# Patient Record
Sex: Male | Born: 1950 | Race: White | Hispanic: No | Marital: Married | Smoking: Former smoker
Health system: Southern US, Community
[De-identification: ages and names within clinical notes are randomized; demographics above are authoritative.]

## PROBLEM LIST (undated history)

## (undated) DIAGNOSIS — K449 Diaphragmatic hernia without obstruction or gangrene: Secondary | ICD-10-CM

## (undated) DIAGNOSIS — M79609 Pain in unspecified limb: Secondary | ICD-10-CM

## (undated) DIAGNOSIS — I2510 Atherosclerotic heart disease of native coronary artery without angina pectoris: Secondary | ICD-10-CM

## (undated) DIAGNOSIS — A048 Other specified bacterial intestinal infections: Secondary | ICD-10-CM

## (undated) DIAGNOSIS — I509 Heart failure, unspecified: Secondary | ICD-10-CM

## (undated) DIAGNOSIS — I200 Unstable angina: Secondary | ICD-10-CM

## (undated) DIAGNOSIS — R931 Abnormal findings on diagnostic imaging of heart and coronary circulation: Secondary | ICD-10-CM

## (undated) DIAGNOSIS — Z95810 Presence of automatic (implantable) cardiac defibrillator: Secondary | ICD-10-CM

## (undated) DIAGNOSIS — I6529 Occlusion and stenosis of unspecified carotid artery: Secondary | ICD-10-CM

## (undated) DIAGNOSIS — M1990 Unspecified osteoarthritis, unspecified site: Secondary | ICD-10-CM

## (undated) DIAGNOSIS — K222 Esophageal obstruction: Secondary | ICD-10-CM

## (undated) DIAGNOSIS — K219 Gastro-esophageal reflux disease without esophagitis: Secondary | ICD-10-CM

## (undated) DIAGNOSIS — Z122 Encounter for screening for malignant neoplasm of respiratory organs: Secondary | ICD-10-CM

## (undated) DIAGNOSIS — I10 Essential (primary) hypertension: Secondary | ICD-10-CM

## (undated) DIAGNOSIS — E785 Hyperlipidemia, unspecified: Secondary | ICD-10-CM

## (undated) DIAGNOSIS — I252 Old myocardial infarction: Secondary | ICD-10-CM

## (undated) DIAGNOSIS — K269 Duodenal ulcer, unspecified as acute or chronic, without hemorrhage or perforation: Secondary | ICD-10-CM

## (undated) DIAGNOSIS — I739 Peripheral vascular disease, unspecified: Secondary | ICD-10-CM

## (undated) DIAGNOSIS — I219 Acute myocardial infarction, unspecified: Secondary | ICD-10-CM

## (undated) HISTORY — DX: Duodenal ulcer, unspecified as acute or chronic, without hemorrhage or perforation: K26.9

## (undated) HISTORY — DX: Hyperlipidemia, unspecified: E78.5

## (undated) HISTORY — DX: Encounter for screening for malignant neoplasm of respiratory organs: Z12.2

## (undated) HISTORY — DX: Acute myocardial infarction, unspecified: I21.9

## (undated) HISTORY — DX: Old myocardial infarction: I25.2

## (undated) HISTORY — DX: Unstable angina: I20.0

## (undated) HISTORY — DX: Abnormal findings on diagnostic imaging of heart and coronary circulation: R93.1

## (undated) HISTORY — DX: Gastro-esophageal reflux disease without esophagitis: K21.9

## (undated) HISTORY — DX: Unspecified osteoarthritis, unspecified site: M19.90

## (undated) HISTORY — DX: Esophageal obstruction: K22.2

## (undated) HISTORY — DX: Other specified bacterial intestinal infections: A04.8

## (undated) HISTORY — DX: Diaphragmatic hernia without obstruction or gangrene: K44.9

## (undated) HISTORY — PX: TONSILLECTOMY: SUR1361

## (undated) HISTORY — DX: Occlusion and stenosis of unspecified carotid artery: I65.29

## (undated) HISTORY — DX: Atherosclerotic heart disease of native coronary artery without angina pectoris: I25.10

## (undated) HISTORY — PX: BACK SURGERY: SHX140

## (undated) HISTORY — DX: Pain in unspecified limb: M79.609

## (undated) HISTORY — PX: APPENDECTOMY: SHX54

## (undated) HISTORY — PX: SPINE SURGERY: SHX786

---

## 1999-04-04 ENCOUNTER — Emergency Department (HOSPITAL_COMMUNITY): Admission: EM | Admit: 1999-04-04 | Discharge: 1999-04-04 | Payer: Self-pay | Admitting: Emergency Medicine

## 1999-04-04 ENCOUNTER — Encounter: Payer: Self-pay | Admitting: Emergency Medicine

## 2002-03-03 ENCOUNTER — Emergency Department (HOSPITAL_COMMUNITY): Admission: EM | Admit: 2002-03-03 | Discharge: 2002-03-03 | Payer: Self-pay | Admitting: Emergency Medicine

## 2002-10-15 ENCOUNTER — Emergency Department (HOSPITAL_COMMUNITY): Admission: EM | Admit: 2002-10-15 | Discharge: 2002-10-15 | Payer: Self-pay | Admitting: Emergency Medicine

## 2002-10-15 ENCOUNTER — Encounter: Payer: Self-pay | Admitting: Emergency Medicine

## 2003-09-29 ENCOUNTER — Ambulatory Visit (HOSPITAL_COMMUNITY): Admission: RE | Admit: 2003-09-29 | Discharge: 2003-09-30 | Payer: Self-pay | Admitting: Neurosurgery

## 2003-10-21 ENCOUNTER — Encounter: Admission: RE | Admit: 2003-10-21 | Discharge: 2003-10-21 | Payer: Self-pay | Admitting: Neurosurgery

## 2004-05-17 ENCOUNTER — Ambulatory Visit (HOSPITAL_COMMUNITY): Admission: RE | Admit: 2004-05-17 | Discharge: 2004-05-17 | Payer: Self-pay

## 2006-01-05 ENCOUNTER — Encounter (INDEPENDENT_AMBULATORY_CARE_PROVIDER_SITE_OTHER): Payer: Self-pay | Admitting: Specialist

## 2006-01-05 ENCOUNTER — Inpatient Hospital Stay (HOSPITAL_COMMUNITY): Admission: RE | Admit: 2006-01-05 | Discharge: 2006-01-06 | Payer: Self-pay | Admitting: Vascular Surgery

## 2006-01-05 HISTORY — PX: CAROTID ENDARTERECTOMY: SUR193

## 2006-07-31 ENCOUNTER — Ambulatory Visit: Payer: Self-pay | Admitting: Vascular Surgery

## 2006-10-17 ENCOUNTER — Ambulatory Visit: Payer: Self-pay | Admitting: Vascular Surgery

## 2007-02-09 ENCOUNTER — Ambulatory Visit: Payer: Self-pay | Admitting: Vascular Surgery

## 2007-09-28 ENCOUNTER — Ambulatory Visit: Payer: Self-pay | Admitting: Vascular Surgery

## 2007-11-29 DIAGNOSIS — I252 Old myocardial infarction: Secondary | ICD-10-CM | POA: Insufficient documentation

## 2007-11-29 DIAGNOSIS — I219 Acute myocardial infarction, unspecified: Secondary | ICD-10-CM

## 2007-11-29 HISTORY — DX: Old myocardial infarction: I25.2

## 2007-11-29 HISTORY — DX: Acute myocardial infarction, unspecified: I21.9

## 2007-12-05 ENCOUNTER — Inpatient Hospital Stay (HOSPITAL_COMMUNITY): Admission: EM | Admit: 2007-12-05 | Discharge: 2007-12-06 | Payer: Self-pay | Admitting: Emergency Medicine

## 2008-05-20 HISTORY — PX: COLONOSCOPY: SHX174

## 2008-10-03 ENCOUNTER — Ambulatory Visit: Payer: Self-pay | Admitting: Vascular Surgery

## 2008-12-15 ENCOUNTER — Encounter: Admission: RE | Admit: 2008-12-15 | Discharge: 2008-12-15 | Payer: Self-pay | Admitting: Orthopedic Surgery

## 2009-11-03 ENCOUNTER — Ambulatory Visit: Payer: Self-pay | Admitting: Vascular Surgery

## 2010-07-13 NOTE — Procedures (Signed)
CAROTID DUPLEX EXAM   INDICATION:  Followup carotid artery disease.   HISTORY:  Diabetes:  No.  Cardiac:  Mild heart attack October 2009.  Hypertension:  No.  Smoking:  Previous.  Previous Surgery:  Right CEA with DPA 01/05/2006.  CV History:  Asymptomatic.  Amaurosis Fugax No, Paresthesias No, Hemiparesis No                                       RIGHT             LEFT  Brachial systolic pressure:         165               158  Brachial Doppler waveforms:         WNL               WNL  Vertebral direction of flow:        Antegrade         Antegrade  DUPLEX VELOCITIES (cm/sec)  CCA peak systolic                   61                79  ECA peak systolic                   59                70  ICA peak systolic                   87                130  ICA end diastolic                   38                45  PLAQUE MORPHOLOGY:                  N/A               Calcified  PLAQUE AMOUNT:                      N/A               Mild/moderate  PLAQUE LOCATION:                    N/A  ICA/ECA/bifurcation   IMPRESSION:  1. Right ICA shows no evidence of restenosis status post CEA.  2. Left ICA shows evidence of 40-59% stenosis.  3. No significant changes from previous study.   ___________________________________________  Larina Earthly, M.D.   AS/MEDQ  D:  10/03/2008  T:  10/03/2008  Job:  [id number]

## 2010-07-13 NOTE — Procedures (Signed)
CAROTID DUPLEX EXAM   INDICATION:  Followup carotid artery disease.   HISTORY:  Diabetes:  No  Cardiac:  No  Hypertension:  No  Smoking:  Quit  Previous Surgery:  Right CEA with DPA 01/05/2006  CV History:  Asymptomatic now, no amaurosis fugax since surgery  Amaurosis Fugax Yes, Paresthesias No, Hemiparesis No                                       RIGHT             LEFT  Brachial systolic pressure:         180               180  Brachial Doppler waveforms:         Triphasic         Triphasic  Vertebral direction of flow:        Antegrade         Antegrade  DUPLEX VELOCITIES (cm/sec)  CCA peak systolic                   67                90  ECA peak systolic                   87                79  ICA peak systolic                   83                118  ICA end diastolic                   35                37  PLAQUE MORPHOLOGY:                  None              Calcified  PLAQUE AMOUNT:                      None              Mild/moderate  PLAQUE LOCATION:                    None              ICA/ECA   IMPRESSION:  1. The right ICA shows no evidence of restenosis status post CEA.  2. The left ICA shows evidence of 40-59% (low end of range).  3. No significant changes from previous study.   ___________________________________________  Larina Earthly, M.D.   AS/MEDQ  D:  02/09/2007  T:  02/10/2007  Job:  [id number]

## 2010-07-13 NOTE — Procedures (Signed)
CAROTID DUPLEX EXAM   INDICATION:  Follow-up carotid artery disease.   HISTORY:  Diabetes:  No.  Cardiac:  No.  Hypertension:  No.  Smoking:  Quit.  Previous Surgery:  Right CEA with DPA 01/05/2006.  CV History:  Asymptomatic now, no amaurosis fugax since surgery.  Amaurosis Fugax No, Paresthesias No, Hemiparesis No                                       RIGHT             LEFT  Brachial systolic pressure:         152               160  Brachial Doppler waveforms:         Triphasic         Triphasic  Vertebral direction of flow:        Antegrade         Antegrade  DUPLEX VELOCITIES (cm/sec)  CCA peak systolic                   79                80  ECA peak systolic                   82                126  ICA peak systolic                   89                131  ICA end diastolic                   35                44  PLAQUE MORPHOLOGY:                  N/A               Calcified  PLAQUE AMOUNT:                      N/A               Mild/moderate  PLAQUE LOCATION:                    N/A               ICA/ECA   IMPRESSION:  1. Right ICA shows no evidence of restenosis status post CEA.  2. Left ICA shows evidence of 40-59% stenosis.  3. No significant changes from previous study.   ___________________________________________  Larina Earthly, M.D.   AS/MEDQ  D:  09/28/2007  T:  09/28/2007  Job:  [id number]

## 2010-07-13 NOTE — Procedures (Signed)
CAROTID DUPLEX EXAM   INDICATION:  Follow up carotid artery disease.   HISTORY:  Diabetes:  No.  Cardiac:  MI.  Hypertension:  No.  Smoking:  Previous.  Previous Surgery:  Right carotid endarterectomy with DPA, 01/05/06.  CV History:  No.  Amaurosis Fugax No, Paresthesias No, Hemiparesis No.                                       RIGHT             LEFT  Brachial systolic pressure:         170               162  Brachial Doppler waveforms:         WNL               WNL  Vertebral direction of flow:        Antegrade         Antegrade  DUPLEX VELOCITIES (cm/sec)  CCA peak systolic                   78                81  ECA peak systolic                   47                72  ICA peak systolic                   90                137  ICA end diastolic                   40                65  PLAQUE MORPHOLOGY:                  N/A  Calcific/heterogenous  PLAQUE AMOUNT:                      N/A               Moderate  PLAQUE LOCATION:                    N/A               Bulb, ICA   IMPRESSION:  1. Right internal carotid artery appears patent, status post carotid      endarterectomy with no evidence of restenosis.  2. Left internal carotid artery suggests 40% to 59% stenosis.  3. Antegrade flow in bilateral vertebrals.   ___________________________________________  Larina Earthly, M.D.   CB/MEDQ  D:  11/03/2009  T:  11/03/2009  Job:  [id number]

## 2010-07-13 NOTE — Assessment & Plan Note (Signed)
OFFICE VISIT   [name], [name]  DOB:  05/29/1950                                       11/03/2009  CHART#:[id number]   [name] presents today for continued follow-up of his extensive  vascular occlusive disease.  He is known to me from prior right carotid  endarterectomy for asymptomatic disease in November 2007.  He has had no  neurologic deficits.  He quit smoking at the time of his surgery and has  not resumed since.  He does have a history of prior myocardial  infarction in 2009.  He had a prior lumbar back surgery.   SOCIAL HISTORY:  He works in [employer].  He is married with one child.  He  does have a couple of alcohol drinks per day.   FAMILY HISTORY:  Significant for coronary artery disease in both the  mother and father.   REVIEW OF SYSTEMS:  Mainly positive for arthritis, musculoskeletal joint  pain.  Otherwise negative.   PHYSICAL EXAMINATION:  Well-developed, well-nourished, white male  appearing his stated age in no acute distress.  Blood pressure 179/113,  heart rate 65, respirations 18.  HEENT is normal.  Chest:  Clear  bilaterally.  He does have palpable radial pulses bilaterally.  He has a  well-healed neck incision on the right with no carotid bruits.  Musculoskeletal:  Shows no major deformities or cyanosis.  Neurologic:  No focal weakness or paresthesias.  Skin:  Without ulcers or rashes..  He underwent a repeat carotid duplex today.  I discussed this with Mr.  and [name].  This reveals no evidence of recurrence in his right  carotid endarterectomy site.  He does have moderate 40 to 59% stenosis  in his left internal carotid artery and this is unchanged.  He will  notify us should he develop any neurologic deficits.  Otherwise, we will  see him on a continued yearly duplex follow-up of his carotids.     [name], M.D.  Electronically Signed   TFE/MEDQ  D:  11/03/2009  T:  11/04/2009  Job:  [id number]   cc:   C. Duane Lope,  M.D.

## 2010-07-13 NOTE — Cardiovascular Report (Signed)
NAMEBRITTIN, [name] NO.:  [id number]   MEDICAL RECORD NO.:  [id number]          PATIENT TYPE:  INP   LOCATION:  4735                         FACILITY:  [facility]   PHYSICIAN:  Jake Bathe, MD      DATE OF BIRTH:  [birth year]   DATE OF PROCEDURE:  DATE OF DISCHARGE:  12/06/2007                            CARDIAC CATHETERIZATION   PROCEDURES:  1. Left heart catheterization  2. Selective coronary angiography.  3. Left ventriculogram.   INDICATIONS:  A 60 year old male with unstable angina, mildly positive  troponin at 0.08 with third set normal troponin with a history of  hyperlipidemia and peripheral vascular disease status post right carotid  endarterectomy, who quit smoking 2 years ago and both his mother and  father has had coronary artery disease in their 36s.   PROCEDURE DETAILS:  The patient's informed consent was obtained.  Risk  and benefits including stroke, heart attack, death were relayed to the  patient.  He was placed on the catheterization table, prepped in a  sterile fashion.  Using the modified Seldinger technique after 1%  lidocaine for local anesthesia, a 6-French sheath was inserted to the  right femoral artery after visualization of the femoral head under  fluoroscopic guidance.  A Judkins left #4 catheter was then selectively  cannulated into the left main artery and multiple views with Omnipaque  hand injection were obtained.  This catheter was then exchanged for a  Judkins right #4 catheter, which was used to selectively cannulate the  right coronary artery and multiple views of hand injection of Omnipaque  were obtained.  This catheter was exchanged for an angled pigtail  catheter which was used to cross the aortic valve in the left ventricle.  In the RAO position, a powered left ventriculogram was obtained  utilizing 28 mL of contrast.  Following the procedure, sheaths were  removed after ACT was drawn, and the patient was hemodynamically  stable  with no evidence of hematoma.   FINDINGS:  1. Left main artery - no angiographically significant coronary artery      disease, branches into LAD and circumflex.  2. Left anterior descending artery:  There are 2 large diagonal      branches.  The vessel narrows at the point of the first diagonal      branch, just before the first major septal branch.  This narrowing      is approximately to 50% of the proximal portion.  The rest of the      vessel continues at this diameter without any significant stenosis.  3. Left circumflex artery:  This is a large caliber vessel with minor      irregularities throughout, especially at a branch segment of the      second and third obtuse marginal branches, which originates in      close proximity to each other in the mid segment.  There is a small      caliber ramus branch.  4. Right coronary artery - in the mid segment, there is a 50-60%  tubular lesion, approximately 10-15 mm in length before the takeoff      of the PDA.  Within the PDA, there are a few minor irregularities      distally.   Left ventriculogram:  No wall motion abnormalities.  Normal left  ventricular ejection fraction of 65%.  No significant mitral  regurgitation.   HEMODYNAMICS:  Left ventricular systolic pressure 113, end-diastolic  pressure 9 mmHg.  Aortic pressure 116/62 with a mean of 87 mmHg.  There  is no gradient across the aortic valve.   IMPRESSIONS:  1. Moderate coronary artery disease, likely nonobstructive noted in      the mid right coronary artery up to 60% as described above.  Minor      irregularities throughout other arterial branches.  2. Normal wall motion of the left ventricle with ejection fraction of      65%.  3. Normal left ventricular end-diastolic pressure.   PLAN:  We will medically manage with aspirin and Plavix.  Plavix x1  month.  We will bring back to the office and reevaluate chest  discomfort.  We will likely proceed with  nuclear stress test for further  detection of any possible ischemia in the RCA segment territory or the  inferior wall given that moderate severity lesion.  Given his  bradycardia, I will not initiate beta blocker.  I will, however, give  him isosorbide mononitrate 30 mg once a day.  I will also give him  prescription for nitroglycerin p.r.n.  Certainly, his chest discomfort  could have been a vasospastic event, hence the nitroglycerin.  In  addition, I have placed him on pravastatin given his multiple statin  intolerances in the past.  I would like him to be on a statin  medication, especially given his prior carotid endarterectomy.  Creatinine today was 1.0, decreased with IV fluid hydration.      Jake Bathe, MD  Electronically Signed     MCS/MEDQ  D:  12/06/2007  T:  12/06/2007  Job:  [id number]   cc:   C. Duane Lope, M.D.

## 2010-07-13 NOTE — Consult Note (Signed)
[name], [name] NO.:  [id number]   MEDICAL RECORD NO.:  [id number]          PATIENT TYPE:  INP   LOCATION:  4735                         FACILITY:  [facility]   PHYSICIAN:  Jake Bathe, MD      DATE OF BIRTH:  08-12-1950   DATE OF CONSULTATION:  12/05/2007  DATE OF DISCHARGE:                                 CONSULTATION   CHIEF COMPLAINT:  Chest pain.   [name] is a 60 year old male patient with no known history of coronary  artery disease.  He was up at 5 a.m. today, drinking coffee, and  suddenly he had severe substernal chest pain.  He was in the basement.  He made it back up stairs to his wife and then she called EMS.  His only  other complaint was diaphoresis.  He had no nausea, vomiting, dyspnea,  or syncope.   EMS arrived and gave the patient several sublingual nitroglycerin, which  indeed helped, but he said he finally got pain relief with the  nitroglycerin paste in the ER.  Since that time, he has been pain-free.   PAST MEDICAL HISTORY:  He has undergone a right carotid endarterectomy  in the past.  He also has a history of hyperlipidemia.   SOCIAL HISTORY:  He lives in [place] with his wife, plays golf.  Denies tobacco or illicit drug use.  He does drink alcohol daily.   FAMILY HISTORY:  Both parents had heart disease in their 45s.   ALLERGIES:  No known drug allergies.   MEDICATIONS:  1. Baby aspirin 81 mg a day.  2. Protonix 40 mg a day.  3. Lovaza 1 gram daily.  4. Nitroglycerin 1 inch q.6 h.   ROS: no syncope, no bleeding, no orthopnea. Unless above all other 12  ROS negative.   PHYSICAL EXAMINATION:  VITAL SIGNS:  Temperature 98.1, pulse 51,  respirations 16, blood pressure 134/67, and O2 saturations 96% on room  air.  GENERAL:  He is in no acute distress and wants to go home.  HEENT:  Grossly normal.  Sclerae clear.  Conjunctivae normal.  Nares  without drainage.  NECK:  No carotid or subclavian bruits.  No JVD or  thyromegaly.  CHEST:  Clear to auscultation bilaterally.  No wheezing or rhonchi.  HEART:  Regular rate and rhythm.  No rubs or murmur.  ABDOMEN:  Good bowel sounds.  Nontender and nondistended.  No masses.  No bruits.  EXTREMITIES:  Lower extremity, no peripheral edema.  SKIN:  Warm and dry.  NEUROLOGIC:  Cranial nerves II through XII grossly intact.  Normal mood  and affect.   Chest x-ray personally viewed showed left perihilar and right basilar  atelectasis.   LABORATORY DATA:  D-dimer 0.28.  Hemoglobin 14.8, hematocrit 44.1,  platelets 200, and white count 5.9.  Sodium 138, potassium 4.3, BUN 28,  creatinine 1.5.  Point-of-care markers negative x2.  CK-MB 124/5.8 with  a troponin of 0.08.   EKG shows sinus bradycardia, rate 47 with nonspecific ST-T wave changes  anterolaterally.   ASSESSMENT AND PLAN:  1. Typical chest pain concerning for unstable angina.  2. Dyslipidemia.  3. Family history of coronary artery disease.  4. Peripheral vascular disease status post right carotid      endarterectomy.   The story is concerning for coronary artery disease.  We will start IV  heparin.  Increase aspirin to full dose.  We will not add a beta-blocker  secondary to his bradycardia.  A cardiac catheterization has been  scheduled for tomorrow at 9:00 a.m. under the care of Dr. Donato Schultz.      [name], P.A.      Jake Bathe, MD  Electronically Signed    LB/MEDQ  D:  12/05/2007  T:  12/06/2007  Job:  [id number]   cc:   Jake Bathe, MD  C. Duane Lope, M.D.

## 2010-07-13 NOTE — Assessment & Plan Note (Signed)
OFFICE VISIT   [name], [name]  DOB:  02/26/1951                                       07/31/2006  CHART#:[id number]   The patient presents today for follow-up of his right carotid  arterectomy and Dacron patch angioplasty at [facility] on January 05, 2006.  The patient has had no difficulty since his endarterectomy 6  months ago.  He has returned to his usual activity and has had no  neurologic deficits.  He has had no further visual changes since surgery  and no transient ischemic attack or stroke.  He fortunately had quit  smoking at the time of surgery.  His medical history is otherwise  unchanged with no cardiac difficulties and he does not have any  hypertension or diabetes.   PHYSICAL EXAMINATION:  Well developed, well nourished white male who  appears his stated age of 81.  Blood pressure is 139/95.  Pulse is 68, respirations 18.  His right carotid incision is well healed with no evidence of bruit.  He  has no bruits in the left carotid system.  He has 2+ radial and 2+  dorsalis pedis pulse bilaterally.   He underwent repeat duplex evaluation showing widely patent right  endarterectomy with no evidence of recurrent stenosis.  Left carotid  system shows 40% to 59% stenosis which is unchanged from his  preoperative value.  I have recommended that we continue to follow him  with serial ultrasounds to rule out any progression of his asymptomatic  disease on the right.  He knows to notify us immediately should he have  any difficulty.  He also gave me a platter made  by his brother-in-law who was a [address] and I told him  that I was very much appreciative of this.   Larina Earthly, M.D.  Electronically Signed   TFE/MEDQ  D:  07/31/2006  T:  08/01/2006  Job:  [id number]   cc:   C. Duane Lope, M.D.

## 2010-07-13 NOTE — H&P (Signed)
NAME:  [name], [name]                  ACCOUNT NO.:  [id number]   MEDICAL RECORD NO.:  [id number]          PATIENT TYPE:  INP   LOCATION:  4735                         FACILITY:  [facility]   PHYSICIAN:  Kela Millin, M.D.DATE OF BIRTH:  May 05, 1950   DATE OF ADMISSION:  12/05/2007  DATE OF DISCHARGE:                              HISTORY & PHYSICAL   PRIMARY CARE PHYSICIAN:  C. Duane Lope, M.D.   CHIEF COMPLAINT:  Chest pain.   HISTORY OF PRESENT ILLNESS:  The patient is a 60 year old white male  former smoker with past medical history significant for hyperlipidemia  and status post right carotid endarterectomy in November 2007, who  presents with the above complaints.  He states that he was in his usual  state of health until about 5:15 this morning while he was up ironing  and began experiencing left precordial chest pain.  He describes the  pain as a pressure 10/10 in intensity and he was diaphoretic with some  shortness of breath.  He denies nausea and vomiting.  No radiation.  Also no paresthesias reported.  He was given nitroglycerin and that  helped to relieve the pain.   In the ER, an EKG was done showing sinus bradycardia at a rate of 52  with nonspecific T-wave abnormalities.  His initial point of care  markers were negative and a chest x-ray with atelectasis.  He is  admitted for further evaluation and management.   PAST MEDICAL HISTORY:  1. As above.  2. History of bilateral foot pain - recently started on a steroid      taper.   MEDICATIONS:  1. Fenofibrate.  2. Fish oil.  3. Prednisone taper.  4. Glucosamine.  5. Aspirin.  6. Vitamin B12.   ALLERGIES:  NKDA.   SOCIAL HISTORY:  He quit tobacco 2 years ago after smoking for 30 years.  Occasional alcohol.   FAMILY HISTORY:  His mother and father had MIs in their mid 49s.   REVIEW OF SYSTEMS:  As per HPI, other review of systems negative.   PHYSICAL EXAMINATION:  GENERAL:  The patient is a middle-aged white  male.  He is alert and appropriate in no apparent distress.  VITAL SIGNS:  His temperature is 96.8 with a blood pressure 122/74,  pulse of 52, respiratory rate 20, O2 sat of 98%.  HEENT:  PERRL, EOMI, slightly dry mucous membranes.  No oral exudates.  Sclerae anicteric.  LUNGS:  Decreased breath sounds at the bases, otherwise clear to  auscultation.  CARDIOVASCULAR:  Mildly bradycardic, regular, no S3.  ABDOMEN:  Soft, bowel sounds present, nontender, nondistended.  No  organomegaly and no mass is palpable.  EXTREMITIES:  No cyanosis and no edema.  NEURO:  Alert and oriented x3.  Cranial nerves II-XII are grossly  intact.  Nonfocal exam.   LABORATORY DATA:  As per HPI, also white cell count is 5.9 with a  hemoglobin of 14.8, hematocrit of 44.1, platelet count 200, sodium is  138, potassium of 4.3, chloride 101, BUN is 28, creatinine 1.5, glucose  is 98, calcium is  1.12.   ASSESSMENT/PLAN:  1. Chest pain - as discussed above, we will obtain serial cardiac      enzymes, place on nitroglycerin, aspirin, consult cardiology      pending cardiac enzymes.  Also as noted above, he has been on a      prednisone taper.  We will cover with PPI for possible GI etiology.  2. Renal insufficiency - likely acute, hydrate, follow and recheck.  3. History of hyperlipidemia - follow.  Continue outpatient      medications.  4. History of right carotid artery disease - status post carotid      endarterectomy as above.      Kela Millin, M.D.  Electronically Signed     ACV/MEDQ  D:  12/06/2007  T:  12/06/2007  Job:  [id number]   cc:   C. Duane Lope, M.D.

## 2010-07-13 NOTE — Discharge Summary (Signed)
[name], [name] NO.:  [id number]   MEDICAL RECORD NO.:  [id number]          PATIENT TYPE:  INP   LOCATION:  4735                         FACILITY:  [facility]   PHYSICIAN:  Jake Bathe, MD      DATE OF BIRTH:  Jul 01, 1950   DATE OF ADMISSION:  12/05/2007  DATE OF DISCHARGE:  12/06/2007                               DISCHARGE SUMMARY   DISCHARGE DIAGNOSES:  1. Chest pain, resolved.  2. Coronary artery disease, nonobstructive.  3. Hyperlipidemia with intolerance to numerous statins  4. Status post right carotid endarterectomy.  5. Former smoker.  6. Family history of coronary artery disease.   HOSPITAL COURSE:  [name] is a 60 year old male patient who awoke at 5  a.m. on the day of admission, he was drinking coffee, and developed  severe substernal chest pain, he quickly made it upstairs where he awoke  his wife, and she called EMS.  His only other symptoms were he was  diaphoretic.   EMS was called, and the patient's symptoms abated with sublingual  nitroglycerin.  While in the hospital, his troponin was elevated at  0.08, but his creatinine was somewhat elevated at 1.5, and there is a  question of whether or not this made a small bump in the troponin.  After hydration, his creatinine improved to 1.08.  His cholesterol  levels showed total cholesterol of 196, triglycerides 71, HDL 54, and  LDL 128.  For this reason, he was placed on pravastatin.  Hemoglobin  14.5, hematocrit 42.7, platelets 213, and white count 6.4.  TSH 1.483.  D-dimer was 0.28.  Chest x-ray; probable left perihilar and right  basilar atelectasis.  EKG; sinus bradycardia, rate 47 with no acute ST-T  wave changes.  For the reason of bradycardia, we were unable to use a  beta-blocker.   He then underwent cardiac catheterization on December 06, 2007, and he was  found to have a 60% mid right coronary artery lesion that was felt to be  nonobstructive, however, we will go ahead and perform an  office  Cardiolite to assure that there is no underlying ischemia in this  vessel.   In the meantime, he will go home on the following medications:  1. Plavix 75 mg 1 p.o. daily for 30 days.  2. Aspirin 325 mg a day.  3. Imdur 30 mg a day.  4. Pravastatin 40 mg 1 p.o. nightly.  5. Sublingual nitroglycerin p.r.n. chest pain.   Our office will call him with stress Cardiolite date and time.  He is  not to perform any strenuous activity until this day.  He is to clean  his cath site gently with soap and water.  No scrubbing.  Remain on a  low-sodium, heart-healthy diet.  Increase activity slowly.  No lifting  over 10 pounds for 1 week.  No driving for 2 days.  He is to call us  with any further questions or concerns.      Guy Franco, P.A.      Jake Bathe, MD  Electronically Signed  LB/MEDQ  D:  12/06/2007  T:  12/07/2007  Job:  [id number]   cc:   Jake Bathe, MD

## 2010-07-16 NOTE — Op Note (Signed)
[name], [name]                            ACCOUNT NO.:  [id number]   MEDICAL RECORD NO.:  [id number]                   PATIENT TYPE:  OIB   LOCATION:  2899                                 FACILITY:  [facility]   PHYSICIAN:  Kathaleen Maser. Green, M.D.                 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   PROCEDURE:  Left L4-5 laminotomy and microdiskectomy and left L4-5  extraforaminal microdiskectomy.   SURGEON:  Kathaleen Maser. Green, M.D.   ASSISTANT:  Donalee Citrin, M.D.   ANESTHESIA:  General endotracheal.   INDICATIONS FOR PROCEDURE:  The patient is a 60 year old male with a history  of back and left lower extremity pain consistent with a left-sided L4 and L5  radiculopathy which has failed conservative management.  Workup demonstrates  evidence of two significant problems at the L4-5 level.  He has a left  paracentral disk herniation with an inferior fragment causing compression of  the thecal sac and left-sided L5 nerve root as well as a left L4-5 foraminal  and extraforaminal disk herniation causing compression of the exiting L4  nerve root.  We discussed options of management including the possibility of  undergoing a left-sided L4-5 laminotomy and microdiskectomy with possible  extraforaminal microdiskectomy as well.  The patient is aware of the risks  and benefits and wishes to proceed.   DESCRIPTION OF PROCEDURE:  The patient was taken to the operating room and  placed on the table in the supine position.  After an adequate level of  anesthesia was achieved, the patient was placed prone onto a Wilson frame  and properly padded.  The patient's lumbar region was prepped and draped  sterilely.  A 10 blade was used to make a  linear skin incision overlying the  L4-5 interspace.  This was carried down sharply in the midline.  Subperiosteal dissection was performed exposing the lamina and facet joints  at L4 and L5.  Deep self-retaining retractor was placed.  Intraoperative x-  ray was taken and the level was confirmed.  Laminotomy then performed using  the high speed drill and Kerrison rongeurs.  The remainder of the inferior  aspect of the lamina of L4, the medial aspect of the L4-5 facet joint, and  the superior rim of the L5 lamina.  Ligamentum flavum was then elevated and  resected in a piecemeal fashion using Kerrison rongeurs.  Underlying thecal  sac and exiting L5 nerve root were identified.  Microscope was brought into  the field and used for microdissection of the left-sided L5 nerve root,  underlying disk herniation, and epidural venous plexus coagulated and cut.  Thecal sac and  nerve root were mobilized and retracted toward the midline.  Disk space and disk herniation were readily apparent.  This was then incised  with a 15 blade in a rectangular fashion.  Wide disk space cleanout was then  achieved using pituitary rongeurs, up-angled pituitary rongeurs, and Epstein  curets.  A moderately large subligamentous fragment of disk herniation was  encountered and completely resected.  Dissection then proceeded cephalad  into the axilla of the left-sided L4 nerve root.  There was no obvious free  fragment of disk present.  Attempts to sweep any disk material into the  foramen or into the disk space were unsuccessful.  Because it was felt there  was still significant pressure on the exiting L4 nerve root, however, the  compression was more on the outside aspect of the foramen, it was decided to  make an extraforaminal approach to decompress this nerve root.  The  retractor was repositioned.  Dissection was then made along the inferior  aspect of the transverse process at L4 and the lateral aspect of the L4-5   facet joint complex.  A small amount of the superior facet of L5 was removed  using the high speed drill.  Intertransverse ligament was then elevated and  resected in a piecemeal fashion using Kerrison rongeurs.  Underlying right-  sided L4 nerve root was identified.  A wide intra and extraforaminal  decompression was then performed along the course of the exiting L4 nerve  root.  A moderate amount of disk herniation was encountered and completely  resected beneath the left-sided L4 nerve root.  At this point, a very  thorough decompression had been achieved.  There was no evidence of injury  to the thecal sac and nerve roots.  There was no evidence of any residual  compression of the thecal sac and nerve roots.  The wound was then irrigated  with antibiotic solution.  Gelfoam was placed topically for hemostasis and  found to be good.  Microscope and retractors were removed.  Hemostasis was  achieved with electrocautery.  The wound was closed in layers with Vicryl  sutures.  Steri-Strips and sterile dressings were applied.  There were no  apparent complications.  The patient tolerated the procedure well and he  returns to the recovery room postoperatively.                                               [name], M.D.    HAP/MEDQ  D:  09/29/2003  T:  09/29/2003  Job:  [id number]

## 2010-07-16 NOTE — Discharge Summary (Signed)
NAMECHACE, KLIPPEL NO.:  [id number]   MEDICAL RECORD NO.:  [id number]          PATIENT TYPE:  INP   LOCATION:  3313                         FACILITY:  [facility]   PHYSICIAN:  Larina Earthly, M.D.    DATE OF BIRTH:  18-May-1950   DATE OF ADMISSION:  01/05/2006  DATE OF DISCHARGE:  01/06/2006                               DISCHARGE SUMMARY   PRIMARY ADMITTING DIAGNOSIS:  Symptomatic right internal carotid artery  stenosis.   ADDITIONAL/DISCHARGE DIAGNOSES:  1. Severe symptomatic right internal carotid artery stenosis.  2. Hyperlipidemia.  3. Ongoing tobacco abuse.   PROCEDURES PERFORMED:  Right carotid endarterectomy with Dacron patch  angioplasty.   HISTORY:  The patient is a 60 year old male who approximately 6 weeks  prior to this admission had an episode of right eye amaurosis fugax.  In  the ensuing weeks, he had an additional episode and was referred to Dr.  Elise Benne for an ophthalmologic exam.  This showed no evidence of  abnormalities in the eye.  He also had a sed rate and a C-reactive  protein drawn all of which were normal.  He subsequently underwent a  carotid duplex scan which revealed a severe right carotid stenosis and a  moderate left carotid stenosis.  He then was referred to Dr. Tawanna Cooler Early  for consideration of surgical intervention.  Dr. Arbie Cookey reviewed his carotid duplex which showed a severe 80-99% right  internal carotid artery stenosis with a 40-50% left carotid stenosis.  It was Dr. Bosie Helper opinion that he should undergo a right carotid  endarterectomy at this time in order to decrease his risk of stroke.  He  explained the risks, benefits and alternatives of the procedure to the  patient and he agreed to proceed with surgery.   HOSPITAL COURSE:  He was admitted to [facility] on January 05, 2006, and underwent a right carotid endarterectomy as described in  detail above, performed by Dr. Arbie Cookey.  He tolerated the procedure  well  and was transferred to the step-down unit in stable condition.  Postoperatively, he progressed well.  He initially required a low-dose  dopamine drip for some hypotension but by post-op day #1 had been weaned  from the drips completely.  He was able to ambulate in the halls without  problem.  On post-op day #1, his incision was healing well.  He was  neurologically intact.  His labs showed a hemoglobin of 12.7, hematocrit 37, platelets 224,  white count 7.6.  Sodium 140, potassium 4, BUN 9, creatinine 0.7.  He continued to progress over the morning of post-op day #1 and about  mid day was deemed ready for discharge home.   At the time of discharge he was tolerating a regular diet.   DISCHARGE MEDICATIONS:  As follows:  1. Aspirin 81 mg daily.  2. Glucosamine 1500 mg daily.  3. B12 1000 mg daily.  4. Fish oil one daily.  5. Tylox one to two q.4 h p.r.n. for pain.   DISCHARGE INSTRUCTIONS:  1. He is asked to refrain from driving,  heavy lifting or strenuous      activity.  2. He may continue ambulating daily and using his incentive      spirometer.  3. He may shower daily and clean his incisions with soap and water.  4. He will continue low-fat, low-sodium diet.   DISCHARGE FOLLOWUP:  He will be contacted by the [facility] office with an  appointment see Dr. Arbie Cookey in 3 weeks.  In the interim, if he experiences  any problems or has questions he is asked to contact our office  immediately.      Coral Ceo, P.A.      Larina Earthly, M.D.  Electronically Signed    GC/MEDQ  D:  03/08/2006  T:  03/08/2006  Job:  [id number]   cc:   C. Duane Lope, M.D.

## 2010-07-16 NOTE — Op Note (Signed)
NAMEPHILBERT, [name] NO.:  [id number]   MEDICAL RECORD NO.:  [id number]          PATIENT TYPE:  INP   LOCATION:  2899                         FACILITY:  [facility]   PHYSICIAN:  Larina Earthly, M.D.    DATE OF BIRTH:  [birth year]   DATE OF PROCEDURE:  01/05/2006  DATE OF DISCHARGE:                                 OPERATIVE REPORT   PREOPERATIVE DIAGNOSIS:  Severe symptomatic right internal carotid artery  stenosis.   POSTOPERATIVE DIAGNOSIS:  Severe symptomatic right internal carotid artery  stenosis.   PROCEDURE:  Right carotid endarterectomy with Dacron patch angioplasty.   SURGEON:  Larina Earthly, M.D.   ASSISTANT:  Janetta Hora. Darrick Penna, M.D.  Coral Ceo, P.A.-C.   ANESTHESIA:  General endotracheal anesthesia.   COMPLICATIONS:  None.   DISPOSITION:  To the recovery room neurologically intact.   PROCEDURE IN DETAIL:  The patient was taken to the operating room and placed  in the supine position where the area of the right neck was prepped and  draped in the usual sterile fashion.  An incision was made anterior to the  sternocleidomastoid and carried down through the platysma with  electrocautery.  The sternocleidomastoid was reflected posteriorly and the  carotid sheath was opened.  The facial vein was ligated with 3-0 silk ties  and was divided.  The common carotid artery was encircled with an umbilical  tape and Rumel tourniquet.  The vagus and hypoglossal nerves were identified  and preserved.  The dissection was carried onto the bifurcation and the  superior thyroid artery was circled with 2-0 silk Potts tie, the external  carotid was encircled with a blue vessel loop, and the internal carotid  encircled with umbilical tape and Rumel tourniquet.  The patient was given  8000 units of intravenous heparin.  After adequate circulation time, the  internal, external, and common carotid arteries were occluded.  The common  carotid artery was opened with an 11  blade and extended with Potts scissors  through the plaque onto the internal carotid artery.  A 10 shunt was passed  up the internal carotid artery and allowed to back bleed and then down the  common carotid and secured with Rumel tourniquets.  The endarterectomy was  begun on the common carotid artery and the plaque was divided proximally  with Potts scissors.  The endarterectomy was continued onto the bifurcation.  The external carotid was endarterectomized with eversion technique and the  internal carotid was endarterectomized in an open fashion.  The remaining  atheromatous debris was removed from the endarterectomy plane.  A Finesse  Hemashield Dacron patch was brought on the field and sewn as a patch  angioplasty with a running 6-0 Prolene suture.  Prior to completion of the  anastomosis, the shunt was removed and the usual flush maneuvers undertaken.  The anastomosis was completed.  The external followed by the common and then  the internal carotid occlusion clamps were removed.  Excellent flow  characteristics were noted with handheld Doppler in the internal and  external carotid arteries.  The patient  was given 50 mg protamine to reverse  the heparin.  The wounds were irrigated with saline and hemostasis with  electrocautery.  The wounds were closed with several 3-0 Vicryl sutures  reapproximating the sternocleidomastoid over the carotid sheath.  The  platysma was closed running  3-0 Vicryl suture and, finally, the skin was closed 4-0 subcuticular Vicryl  stitch.  A dressing was applied.  The patient was awakened and was  neurologically intact in the operating room and was returned to the recovery  room in stable condition.      Larina Earthly, M.D.  Electronically Signed     TFE/MEDQ  D:  01/05/2006  T:  01/05/2006  Job:  [id number]   cc:   C. Duane Lope, M.D.  Vincenza Hews, M.D.

## 2010-07-16 NOTE — H&P (Signed)
[name], [name] NO.:  [id number]   MEDICAL RECORD NO.:  [id number]           PATIENT TYPE:   LOCATION:                                 FACILITY:   PHYSICIAN:  Larina Earthly, M.D.         DATE OF BIRTH:   DATE OF ADMISSION:  01/05/2006  DATE OF DISCHARGE:                                HISTORY & PHYSICAL   ADMISSION DIAGNOSIS:  Severe symptomatic right internal carotid artery  stenosis.   HISTORY OF PRESENT ILLNESS:  The patient is a 60 year old gentleman, who  approximately 6 weeks ago had a clear-cut episode of right eye amaurosis  fugax.  He had a similar episode approximately 1 month later and underwent a  workup to include ophthalmologic exam by Dr. Elise Benne showing no  evidence of abnormalities in his eyes.  He also had evaluation of sed rate  and C-reactive protein, all normal.  He underwent a carotid duplex revealing  severe right carotid stenosis and moderate left carotid stenosis.  He  specifically denies any other events of transient ischemic attack or stroke.   PAST MEDICAL HISTORY:  Significant for elevated lipids, but no cardiac  disease and no hypertension or diabetes.   FAMILY HISTORY:  Significant for premature atherosclerotic disease in his  mother and his father with his mother undergoing carotid endarterectomy and  coronary bypass grafting in the past.   SOCIAL HISTORY:  He is married with 1 child.  He works in Airline [employer].  He does  smoke 1 pack of cigarettes per day.  He does have 1-2 drinks of alcohol per  day.   REVIEW OF SYSTEMS:  Positive only for pain in his legs and low back with  prior degenerative disk disease and sciatic pain.  He does have arthritic  joint pain as well.   ALLERGIES:  NO KNOWN DRUG ALLERGIES.   CURRENT MEDICATIONS:  1. Aspirin 81 mg per day.  2. Tylenol Arthritis per day.  3. Glucosamine 1500 mg per day.  4. B12 1000 mg per day.  5. Fish oil daily.   PHYSICAL EXAMINATION:  GENERAL:   Well-developed, well-nourished white male  appearing his stated age of 64.  VITAL SIGNS:  Blood pressure 130/84, pulse 72, respirations 16.  NEUROLOGIC:  He is grossly intact neurologically.  HEENT:  Within normal limits.  He has no carotid bruits bilaterally.  LUNGS:  Chest is clear bilaterally.  CARDIAC:  He has a regular rate and rhythm without murmurs.  His radial,  femoral, and posterior tibial pulses are 2+ bilaterally.   LABORATORY DATA:  A carotid duplex reveals severe 80%-99% right carotid  stenosis and moderate 40%-50% left carotid stenosis.   IMPRESSION:  Severe symptomatic right carotid stenosis with 2 episodes of  amaurosis fugax.   PLAN:  The patient will be admitted for an elective carotid endarterectomy.  The procedure including 1%-2% risk of stroke and low risk of cranial nerve  injury were discussed with the patient and wife, who understand and wish to  proceed with surgery on January 05, 2006.  Larina Earthly, M.D.  Electronically Signed     TFE/MEDQ  D:  01/02/2006  T:  01/03/2006  Job:  [id number]   cc:   C. Duane Lope, M.D.

## 2010-11-02 ENCOUNTER — Other Ambulatory Visit (INDEPENDENT_AMBULATORY_CARE_PROVIDER_SITE_OTHER): Payer: 59

## 2010-11-02 DIAGNOSIS — Z48812 Encounter for surgical aftercare following surgery on the circulatory system: Secondary | ICD-10-CM

## 2010-11-02 DIAGNOSIS — I6529 Occlusion and stenosis of unspecified carotid artery: Secondary | ICD-10-CM

## 2010-11-11 ENCOUNTER — Encounter: Payer: Self-pay | Admitting: Vascular Surgery

## 2010-11-11 NOTE — Procedures (Unsigned)
CAROTID DUPLEX EXAM  INDICATION:  Followup carotid artery disease.  HISTORY: Diabetes:  No Cardiac:  MI Hypertension:  No Smoking:  Previous Previous Surgery:  Right carotid endarterectomy with Dacron patch angioplasty, 01/05/2006 CV History:  Currently asymptomatic Amaurosis Fugax No, Paresthesias No, Hemiparesis No                                      RIGHT             LEFT Brachial systolic pressure:         166               168 Brachial Doppler waveforms:         Normal            Normal Vertebral direction of flow:        Antegrade         Antegrade DUPLEX VELOCITIES (cm/sec) CCA peak systolic                   68                88 ECA peak systolic                   68                87 ICA peak systolic                   75                124 ICA end diastolic                   38                53 PLAQUE MORPHOLOGY:                                    Calcific PLAQUE AMOUNT:                      None              Moderate PLAQUE LOCATION:                                      Bifurcation, ICA  IMPRESSION: 1. Patent right carotid endarterectomy site with no evidence of     restenosis of the ICA. 2. Left internal carotid artery velocity suggests 40% to 59% stenosis. 3. Antegrade vertebral arteries bilaterally.       ___________________________________________ Larina Earthly, M.D.  EM/MEDQ  D:  11/02/2010  T:  11/02/2010  Job:  [id number]

## 2010-11-18 ENCOUNTER — Other Ambulatory Visit: Payer: Self-pay | Admitting: Vascular Surgery

## 2010-11-18 DIAGNOSIS — Z48812 Encounter for surgical aftercare following surgery on the circulatory system: Secondary | ICD-10-CM

## 2010-11-18 DIAGNOSIS — I6529 Occlusion and stenosis of unspecified carotid artery: Secondary | ICD-10-CM

## 2010-11-29 LAB — BASIC METABOLIC PANEL
BUN: 22
CO2: 26
Calcium: 8.3 — ABNORMAL LOW
GFR calc Af Amer: >60
Glucose, Bld: 122 — ABNORMAL HIGH
Potassium: 4.7
Sodium: 135

## 2010-11-29 LAB — POCT CARDIAC MARKERS
CKMB, poc: 2.1
CKMB, poc: 3.4
Myoglobin, poc: 59.1
Myoglobin, poc: 59.3
Troponin i, poc: <0.05
Troponin i, poc: <0.05

## 2010-11-29 LAB — POCT I-STAT, CHEM 8
BUN: 28 — ABNORMAL HIGH
Calcium, Ion: 1.12
Chloride: 101
Creatinine, Ser: 1.5
Glucose, Bld: 98
HCT: 45
Hemoglobin: 15.3
Potassium: 4.3
Sodium: 138
TCO2: 29

## 2010-11-29 LAB — CBC
HCT: 44.1
Hemoglobin: 14.5
Hemoglobin: 14.8
MCHC: 33.5
MCV: 94.7
Platelets: 200
RBC: 4.65
RDW: 13.4
WBC: 5.9

## 2010-11-29 LAB — CK TOTAL AND CKMB (NOT AT ARMC)
CK, MB: 5.8 — ABNORMAL HIGH
Relative Index: 4.7 — ABNORMAL HIGH
Total CK: 124

## 2010-11-29 LAB — LIPID PANEL
Cholesterol: 196
HDL: 54
VLDL: 14

## 2010-11-29 LAB — TROPONIN I: Troponin I: 0.08 — ABNORMAL HIGH

## 2010-11-29 LAB — CARDIAC PANEL(CRET KIN+CKTOT+MB+TROPI): Relative Index: 4.1 — ABNORMAL HIGH

## 2010-11-29 LAB — PROTIME-INR: INR: 1

## 2010-11-29 LAB — TSH: TSH: 1.483

## 2010-11-29 LAB — D-DIMER, QUANTITATIVE: D-Dimer, Quant: 0.28

## 2011-11-03 ENCOUNTER — Encounter: Payer: Self-pay | Admitting: Neurosurgery

## 2011-11-07 ENCOUNTER — Encounter: Payer: Self-pay | Admitting: Neurosurgery

## 2011-11-08 ENCOUNTER — Encounter: Payer: Self-pay | Admitting: Neurosurgery

## 2011-11-08 ENCOUNTER — Ambulatory Visit (INDEPENDENT_AMBULATORY_CARE_PROVIDER_SITE_OTHER): Payer: BC Managed Care – PPO | Admitting: *Deleted

## 2011-11-08 ENCOUNTER — Ambulatory Visit (INDEPENDENT_AMBULATORY_CARE_PROVIDER_SITE_OTHER): Payer: BC Managed Care – PPO | Admitting: Neurosurgery

## 2011-11-08 VITALS — BP 153/101 | HR 59 | Resp 16 | Ht 66.5 in | Wt 177.7 lb

## 2011-11-08 DIAGNOSIS — Z48812 Encounter for surgical aftercare following surgery on the circulatory system: Secondary | ICD-10-CM

## 2011-11-08 DIAGNOSIS — I6529 Occlusion and stenosis of unspecified carotid artery: Secondary | ICD-10-CM

## 2011-11-08 DIAGNOSIS — Z9889 Other specified postprocedural states: Secondary | ICD-10-CM | POA: Insufficient documentation

## 2011-11-08 DIAGNOSIS — M79606 Pain in leg, unspecified: Secondary | ICD-10-CM

## 2011-11-08 DIAGNOSIS — M79609 Pain in unspecified limb: Secondary | ICD-10-CM

## 2011-11-08 NOTE — Progress Notes (Signed)
[facility] Carotid Office Note  CC: Annual carotid stenosis surveillance Referring Physician: Early  History of Present Illness: 61 year old male patient of Dr. Arbie Cookey followed for known carotid stenosis and history of a right CEA with Dacron patch and 2007. The patient denies any current signs or symptoms of CVA, TIA, amaurosis fugax or any neural deficit. The patient does state he is having "foot pain that is not like claudication pain. The patient states he can walk 3 miles a time and has no difficulty with that but he does get awakened at night with radiating foot pain that feels like it is moving bilaterally. The patient states he played golf with a neurosurgeon a couple weeks ago and told him of his problem and he recommended we evaluate him.  Past Medical History  Diagnosis Date  . Carotid artery occlusion   . Arthritis   . Heart attack Oct. 2009    Mild  . Hyperlipidemia     ROS: [x]  Positive   [ ]  Denies    General: [ ]  Weight loss, [ ]  Fever, [ ]  chills Neurologic: [ ]  Dizziness, [ ]  Blackouts, [ ]  Seizure [ ]  Stroke, [ ]  "Mini stroke", [ ]  Slurred speech, [ ]  Temporary blindness; [ ]  weakness in arms or legs, [ ]  Hoarseness Cardiac: [ ]  Chest pain/pressure, [ ]  Shortness of breath at rest [ ]  Shortness of breath with exertion, [ ]  Atrial fibrillation or irregular heartbeat Vascular: [ ]  Pain in legs with walking, [ ]  Pain in legs at rest, [ ]  Pain in legs at night,  [ ]  Non-healing ulcer, [ ]  Blood clot in vein/DVT,   Pulmonary: [ ]  Home oxygen, [ ]  Productive cough, [ ]  Coughing up blood, [ ]  Asthma,  [ ]  Wheezing Musculoskeletal:  [ ]  Arthritis, [ ]  Low back pain, [ ]  Joint pain Hematologic: [ ]  Easy Bruising, [ ]  Anemia; [ ]  Hepatitis Gastrointestinal: [ ]  Blood in stool, [ ]  Gastroesophageal Reflux/heartburn, [ ]  Trouble swallowing Urinary: [ ]  chronic Kidney disease, [ ]  on HD - [ ]  MWF or [ ]  TTHS, [ ]  Burning with urination, [ ]  Difficulty  urinating Skin: [ ]  Rashes, [ ]  Wounds Psychological: [ ]  Anxiety, [ ]  Depression   Social History History  Substance Use Topics  . Smoking status: Former Smoker    Types: Cigarettes    Quit date: 02/28/2005  . Smokeless tobacco: Not on file  . Alcohol Use: 0.0 oz/week    1-2 Glasses of wine per week    Family History Family History  Problem Relation Age of Onset  . Heart attack Mother   . Coronary artery disease Mother   . Heart disease Mother     Carotid Stenosis and BPG  . Heart attack Father   . Heart disease Father     BPG    Not on File  Current Outpatient Prescriptions  Medication Sig Dispense Refill  . aspirin 81 MG tablet Take 81 mg by mouth daily.      . fenofibrate 160 MG tablet Take 160 mg by mouth daily.      . fish oil-omega-3 fatty acids 1000 MG capsule Take 2 g by mouth daily.      Marland Kitchen gabapentin (NEURONTIN) 300 MG capsule Take 300 mg by mouth 3 (three) times daily.      . Glucosamine-Chondroit-Vit C-Mn (GLUCOSAMINE 1500 COMPLEX PO) Take by mouth.        Physical Examination  Filed Vitals:   11/08/11 1115  BP: 153/101  Pulse: 59  Resp:     Body mass index is 28.25 kg/(m^2).  General:  WDWN in NAD Gait: Normal HEENT: WNL Eyes: Pupils equal Pulmonary: normal non-labored breathing , without Rales, rhonchi,  wheezing Cardiac: RRR, without  Murmurs, rubs or gallops; Abdomen: soft, NT, no masses Skin: no rashes, ulcers noted  Vascular Exam Pulses: 2+ radial pulses bilaterally Carotid bruits: Carotid pulses to auscultation no bruits are heard Extremities without ischemic changes, no Gangrene , no cellulitis; no open wounds;  Musculoskeletal: no muscle wasting or atrophy   Neurologic: A&O X 3; Appropriate Affect ; SENSATION: normal; MOTOR FUNCTION:  moving all extremities equally. Speech is fluent/normal  Non-Invasive Vascular Imaging CAROTID DUPLEX 11/08/2011  Right ICA 0 - 19% stenosis Left ICA 40 - 59 % stenosis   ASSESSMENT/PLAN:  Asymptomatic patient with a patent right CEA, the patient will followup in one year for repeat carotid duplex and be seen in my clinic. We will schedule ABIs for the next 2-4 weeks and the patient will followup with Dr. Arbie Cookey that time. The patient's questions were encouraged and answered, he is in agreement with this plan.  Lauree Chandler ANP Clinic MD: Early

## 2011-11-08 NOTE — Addendum Note (Signed)
Addended by: Sharee Pimple on: 11/08/2011 12:53 PM   Modules accepted: Orders

## 2011-11-22 ENCOUNTER — Ambulatory Visit: Payer: Self-pay | Admitting: Vascular Surgery

## 2011-12-06 ENCOUNTER — Ambulatory Visit: Payer: Self-pay | Admitting: Vascular Surgery

## 2011-12-12 ENCOUNTER — Emergency Department (HOSPITAL_COMMUNITY)
Admission: EM | Admit: 2011-12-12 | Discharge: 2011-12-12 | Disposition: A | Discharge status: Home / Self Care | Payer: BC Managed Care – PPO | Attending: Emergency Medicine | Admitting: Emergency Medicine

## 2011-12-12 ENCOUNTER — Encounter (HOSPITAL_COMMUNITY): Payer: Self-pay | Admitting: Family Medicine

## 2011-12-12 ENCOUNTER — Emergency Department (HOSPITAL_COMMUNITY): Payer: BC Managed Care – PPO

## 2011-12-12 DIAGNOSIS — E785 Hyperlipidemia, unspecified: Secondary | ICD-10-CM | POA: Insufficient documentation

## 2011-12-12 DIAGNOSIS — Z79899 Other long term (current) drug therapy: Secondary | ICD-10-CM | POA: Insufficient documentation

## 2011-12-12 DIAGNOSIS — M549 Dorsalgia, unspecified: Secondary | ICD-10-CM | POA: Insufficient documentation

## 2011-12-12 DIAGNOSIS — Z87891 Personal history of nicotine dependence: Secondary | ICD-10-CM | POA: Insufficient documentation

## 2011-12-12 DIAGNOSIS — Z7982 Long term (current) use of aspirin: Secondary | ICD-10-CM | POA: Insufficient documentation

## 2011-12-12 DIAGNOSIS — M129 Arthropathy, unspecified: Secondary | ICD-10-CM | POA: Insufficient documentation

## 2011-12-12 DIAGNOSIS — J029 Acute pharyngitis, unspecified: Secondary | ICD-10-CM | POA: Insufficient documentation

## 2011-12-12 DIAGNOSIS — M545 Low back pain, unspecified: Secondary | ICD-10-CM

## 2011-12-12 DIAGNOSIS — R509 Fever, unspecified: Secondary | ICD-10-CM | POA: Insufficient documentation

## 2011-12-12 DIAGNOSIS — Z8673 Personal history of transient ischemic attack (TIA), and cerebral infarction without residual deficits: Secondary | ICD-10-CM | POA: Insufficient documentation

## 2011-12-12 LAB — COMPREHENSIVE METABOLIC PANEL
ALT: 33 U/L (ref 0–53)
AST: 23 U/L (ref 0–37)
Albumin: 3.6 g/dL (ref 3.5–5.2)
Alkaline Phosphatase: 108 U/L (ref 39–117)
BUN: 16 mg/dL (ref 6–23)
CO2: 29 mEq/L (ref 19–32)
Calcium: 9.3 mg/dL (ref 8.4–10.5)
Chloride: 100 mEq/L (ref 96–112)
Creatinine, Ser: 0.88 mg/dL (ref 0.50–1.35)
GFR calc Af Amer: >90 mL/min (ref >90)
GFR calc non Af Amer: >90 mL/min (ref >90)
Glucose, Bld: 105 mg/dL — ABNORMAL HIGH (ref 70–99)
Potassium: 4.2 mEq/L (ref 3.5–5.1)
Sodium: 138 mEq/L (ref 135–145)
Total Bilirubin: 0.5 mg/dL (ref 0.3–1.2)
Total Protein: 7.5 g/dL (ref 6.0–8.3)

## 2011-12-12 LAB — CBC WITH DIFFERENTIAL/PLATELET
Basophils Absolute: 0 10*3/uL (ref 0.0–0.1)
Basophils Relative: 0 % (ref 0–1)
Eosinophils Absolute: 0.3 10*3/uL (ref 0.0–0.7)
Eosinophils Relative: 4 % (ref 0–5)
HCT: 45.9 % (ref 39.0–52.0)
Hemoglobin: 16.1 g/dL (ref 13.0–17.0)
Lymphocytes Relative: 19 % (ref 12–46)
Lymphs Abs: 1.8 10*3/uL (ref 0.7–4.0)
MCH: 32.5 pg (ref 26.0–34.0)
MCHC: 35.1 g/dL (ref 30.0–36.0)
MCV: 92.5 fL (ref 78.0–100.0)
Monocytes Absolute: 0.9 10*3/uL (ref 0.1–1.0)
Monocytes Relative: 9 % (ref 3–12)
Neutro Abs: 6.6 10*3/uL (ref 1.7–7.7)
Neutrophils Relative %: 68 % (ref 43–77)
Platelets: 174 10*3/uL (ref 150–400)
RBC: 4.96 MIL/uL (ref 4.22–5.81)
RDW: 12.5 % (ref 11.5–15.5)
WBC: 9.7 10*3/uL (ref 4.0–10.5)

## 2011-12-12 LAB — URINALYSIS, ROUTINE W REFLEX MICROSCOPIC
Bilirubin Urine: NEGATIVE
Glucose, UA: NEGATIVE mg/dL
Hgb urine dipstick: NEGATIVE
Ketones, ur: NEGATIVE mg/dL
Leukocytes, UA: NEGATIVE
Nitrite: NEGATIVE
Protein, ur: NEGATIVE mg/dL
Specific Gravity, Urine: 1.022 (ref 1.005–1.030)
Urobilinogen, UA: 1 mg/dL (ref 0.0–1.0)
pH: 8.5 — ABNORMAL HIGH (ref 5.0–8.0)

## 2011-12-12 MED ORDER — KETOROLAC TROMETHAMINE 30 MG/ML IJ SOLN
30.0000 mg | Freq: Once | INTRAMUSCULAR | Status: AC
  Administered 2011-12-12: 30 mg via INTRAVENOUS
  Filled 2011-12-12: qty 1

## 2011-12-12 MED ORDER — IBUPROFEN 800 MG PO TABS: 800.0000 mg | ORAL_TABLET | Freq: Three times a day (TID) | ORAL | Status: DC | PRN

## 2011-12-12 MED ORDER — MORPHINE SULFATE 4 MG/ML IJ SOLN
4.0000 mg | Freq: Once | INTRAMUSCULAR | Status: AC
  Administered 2011-12-12: 4 mg via INTRAVENOUS
  Filled 2011-12-12: qty 1

## 2011-12-12 MED ORDER — SODIUM CHLORIDE 0.9 % IV BOLUS (SEPSIS)
1000.0000 mL | Freq: Once | INTRAVENOUS | Status: AC
  Administered 2011-12-12: 1000 mL via INTRAVENOUS

## 2011-12-12 MED ORDER — CYCLOBENZAPRINE HCL 10 MG PO TABS: 10.0000 mg | ORAL_TABLET | Freq: Three times a day (TID) | ORAL | Status: DC | PRN

## 2011-12-12 MED ORDER — OXYCODONE-ACETAMINOPHEN 5-325 MG PO TABS: 1.0000 | ORAL_TABLET | Freq: Four times a day (QID) | ORAL | Status: DC | PRN

## 2011-12-12 MED ORDER — HYDROCODONE-ACETAMINOPHEN 5-325 MG PO TABS: 1.0000 | ORAL_TABLET | Freq: Four times a day (QID) | ORAL | Status: DC | PRN

## 2011-12-12 NOTE — ED Notes (Signed)
Pt asked to change into gown. 

## 2011-12-12 NOTE — ED Notes (Signed)
Per pt hasnt been feeling well for a few days. sts went to [facility] for sore throat and flu like symptoms. sts was feeling better Saturday and then yesterday started having severe left sided back pain that doesn't radiate anywhere. sts unable to get comfortable. Denies N,V,D. Denies urinary symptoms.

## 2011-12-12 NOTE — ED Provider Notes (Signed)
History     CSN: [id number]  Arrival date & time 12/12/11  4098   First MD Initiated Contact with Patient 12/12/11 [phone]      Chief Complaint  Patient presents with  . Back Pain    (Consider location/radiation/quality/duration/timing/severity/associated sxs/prior treatment) HPI The patient presents to the emergency department with left lower back pain.  He states sudden onset of pain last night around 8pm.  He has a past history of sciatic nerve pain and spinal surgery, but states this pain is different.  The pain is sharp and constant.  The pain is worse when standing and he is unable to find a position that is pain free.  He denies taking any otc pain medication.  He denies fall, injury, extremity weakness, numbness, radiating pain, neck pain, dizziness, abdominal pain, urinary changes, and bowel changes.  The patient also reports a 3 day history of sore throat.  He reports low grade fever, chills, and a negative rapid strep swab.  He denies ear pain, nasal congestion, cough, sob, chest pain, nausea, and vomiting.    Past Medical History  Diagnosis Date  . Carotid artery occlusion   . Arthritis   . Heart attack Oct. 2009    Mild  . Hyperlipidemia     Past Surgical History  Procedure Date  . Spine surgery   . Carotid endarterectomy 01/05/2006    Right  CEA with DPA    Family History  Problem Relation Age of Onset  . Heart attack Mother   . Coronary artery disease Mother   . Heart disease Mother     Carotid Stenosis and BPG  . Heart attack Father   . Heart disease Father     BPG    History  Substance Use Topics  . Smoking status: Former Smoker    Types: Cigarettes    Quit date: 02/28/2005  . Smokeless tobacco: Not on file  . Alcohol Use: 0.0 oz/week    1-2 Glasses of wine per week      Review of Systems All pertinent positives and negatives in the history of present illness   Allergies  Review of patient's allergies indicates no known allergies.  Home  Medications   Current Outpatient Rx  Name Route Sig Dispense Refill  . ASPIRIN 81 MG PO TABS Oral Take 81 mg by mouth daily.    . OMEGA-3 FATTY ACIDS 1000 MG PO CAPS Oral Take 1 g by mouth daily.     Marland Kitchen GABAPENTIN 300 MG PO CAPS Oral Take 300 mg by mouth 2 (two) times daily.     Marland Kitchen GLUCOSAMINE 1500 COMPLEX PO Oral Take 1 tablet by mouth daily.     . IBUPROFEN 200 MG PO TABS Oral Take 400 mg by mouth 2 (two) times daily.    Marland Kitchen OVER THE COUNTER MEDICATION Oral Take 30 mLs by mouth at bedtime as needed. Z Quil- for sleep    . ALKA-SELTZER PLUS COLD & FLU PO Oral Take 2 tablets by mouth every 6 (six) hours as needed. For cold symptoms    . PSYLLIUM 95 % PO PACK Oral Take 1 packet by mouth daily.      BP 179/98  Pulse 69  Temp 98.2 F (36.8 C) (Oral)  Resp 18  SpO2 94%  Physical Exam  Constitutional: He is oriented to person, place, and time. He appears well-developed and well-nourished.  HENT:  Head: Normocephalic and atraumatic.  Right Ear: External ear normal.  Left Ear: External ear normal.  Mouth/Throat: Mucous membranes are normal. Posterior oropharyngeal erythema present. No oropharyngeal exudate.  Neck: Normal range of motion. Neck supple.  Cardiovascular: Normal rate, regular rhythm, normal heart sounds and intact distal pulses.   Pulmonary/Chest: Effort normal and breath sounds normal. No respiratory distress. He has no wheezes. He has no rales. He exhibits no tenderness.  Abdominal: Soft. Bowel sounds are normal. He exhibits no distension and no mass. There is no tenderness. There is no rebound and no guarding.  Musculoskeletal:       Cervical back: Normal.       Thoracic back: Normal.       Lumbar back: He exhibits tenderness and pain. He exhibits normal range of motion, no bony tenderness, no swelling and no edema.       Back:  Neurological: He is alert and oriented to person, place, and time. He has normal strength. He displays normal reflexes. No sensory deficit.  Coordination normal.  Skin: Skin is warm and dry.    ED Course  Procedures (including critical care time)   The patient does not have any abdominal pain. The patient has palpable back pain. The patient is not showing any signs that would indicate aortic issues at this time. The patient is stable and with normal gait and reflexes as well. The patient is advised to return here as needed. The patient is feeling better following pain medications.  MDM          Carlyle Dolly, PA-C 12/16/11 [phone]

## 2011-12-13 ENCOUNTER — Ambulatory Visit: Payer: Self-pay | Admitting: Vascular Surgery

## 2011-12-16 NOTE — ED Provider Notes (Signed)
Medical screening examination/treatment/procedure(s) were performed by non-physician practitioner and as supervising physician I was immediately available for consultation/collaboration.   Jewell Ryans B. Bernette Mayers, MD 12/16/11 1344

## 2012-11-12 ENCOUNTER — Encounter: Payer: Self-pay | Admitting: Family

## 2012-11-13 ENCOUNTER — Other Ambulatory Visit (INDEPENDENT_AMBULATORY_CARE_PROVIDER_SITE_OTHER): Payer: BC Managed Care – PPO | Admitting: *Deleted

## 2012-11-13 ENCOUNTER — Ambulatory Visit (INDEPENDENT_AMBULATORY_CARE_PROVIDER_SITE_OTHER): Payer: BC Managed Care – PPO | Admitting: Family

## 2012-11-13 ENCOUNTER — Encounter: Payer: Self-pay | Admitting: Family

## 2012-11-13 DIAGNOSIS — I6529 Occlusion and stenosis of unspecified carotid artery: Secondary | ICD-10-CM

## 2012-11-13 DIAGNOSIS — Z48812 Encounter for surgical aftercare following surgery on the circulatory system: Secondary | ICD-10-CM

## 2012-11-13 NOTE — Addendum Note (Signed)
Addended by: Adria Dill L on: 11/13/2012 04:31 PM   Modules accepted: Orders

## 2012-11-13 NOTE — Progress Notes (Signed)
Established Carotid Patient  Previous Carotid surgery: Yes Surgeon: Early  History of Present Illness  [name] is a 62 y.o. male who has a history of a right CEA with Dacron patch and 2007. He had 2 episodes of amaurosis fugax on one eye, he cannot remember which eye, before he had the CEA and has had no further episodes. He complains of pain in the lower portion of both feet, worse at night, alleviated by walking; he states this is being evaluated. He also has a history of lumbar spine surgery for sciatica. He sees a [facility] every 6 weeks.  The patient  denies facial drooping.  Pt. denies hemiplegia.  The patient denies receptive or expressive aphasia.  Pt. denies extremity weakness.  Patient denies New Medical or Surgical History  Pt Diabetic: No Pt smoker: former smoker, quit in 2007  Pt meds include: Statin : No: states he cannot take statins due to severe myalgias, has tried 3-4 different statins Betablocker: No ASA: Yes Other anticoagulants/antiplatelets: none   Past Medical History  Diagnosis Date  . Carotid artery occlusion   . Arthritis   . Heart attack Oct. 2009    Mild  . Hyperlipidemia     Social History History  Substance Use Topics  . Smoking status: Former Smoker    Types: Cigarettes    Quit date: 02/28/2005  . Smokeless tobacco: Not on file  . Alcohol Use: 0.0 oz/week    1-2 Glasses of wine per week    Family History Family History  Problem Relation Age of Onset  . Heart attack Mother   . Coronary artery disease Mother   . Heart disease Mother     Carotid Stenosis and BPG  . Heart attack Father   . Heart disease Father     BPG    Surgical History Past Surgical History  Procedure Laterality Date  . Spine surgery    . Carotid endarterectomy  01/05/2006    Right  CEA with DPA    No Known Allergies  Current Outpatient Prescriptions  Medication Sig Dispense Refill  . aspirin 81 MG tablet Take 81 mg by mouth daily.      .  cyclobenzaprine (FLEXERIL) 10 MG tablet Take 1 tablet (10 mg total) by mouth 3 (three) times daily as needed for muscle spasms.  15 tablet  0  . fish oil-omega-3 fatty acids 1000 MG capsule Take 1 g by mouth daily.       Marland Kitchen gabapentin (NEURONTIN) 300 MG capsule Take 300 mg by mouth 2 (two) times daily.       . Glucosamine-Chondroit-Vit C-Mn (GLUCOSAMINE 1500 COMPLEX PO) Take 1 tablet by mouth daily.       Marland Kitchen ibuprofen (ADVIL,MOTRIN) 200 MG tablet Take 400 mg by mouth 2 (two) times daily.      Marland Kitchen ibuprofen (ADVIL,MOTRIN) 800 MG tablet Take 1 tablet (800 mg total) by mouth every 8 (eight) hours as needed for pain.  21 tablet  0  . OVER THE COUNTER MEDICATION Take 30 mLs by mouth at bedtime as needed. Z Quil- for sleep      . oxyCODONE-acetaminophen (PERCOCET/ROXICET) 5-325 MG per tablet Take 1 tablet by mouth every 6 (six) hours as needed for pain.  15 tablet  0  . Phenyleph-CPM-DM-APAP (ALKA-SELTZER PLUS COLD & FLU PO) Take 2 tablets by mouth every 6 (six) hours as needed. For cold symptoms      . psyllium (HYDROCIL/METAMUCIL) 95 % PACK Take 1 packet by mouth  daily.       No current facility-administered medications for this visit.    Review of Systems : [x]  Positive   [ ]  Denies  General:[ ]  Weight loss,  [ ]  Weight gain, [ ]  Loss of appetite, [ ]  Fever, [ ]  chills  Neurologic: [ ]  Dizziness, [ ]  Blackouts, [ ]  Headaches, [ ]  Seizure [ ]  Stroke, [ ]  "Mini stroke", [ ]  Slurred speech, [ ]  Temporary blindness;  [ ] weakness,  Ear/Nose/Throat: [ ]  Change in hearing, [ ]  Nose bleeds, [ ]  Hoarseness  Vascular:[ ]  Pain in legs with walking, [ ]  Pain in feet while lying flat , [ ]   Non-healing ulcer, [ ]  Blood clot in vein,    Pulmonary: [ ]  Home oxygen, [ ]   Productive cough, [ ]  Bronchitis, [ ]  Coughing up blood,  [ ]  Asthma, [ ]  Wheezing  Musculoskeletal:  [ ]  Arthritis, [ ]  Joint pain, [ ]  low back pain. Positive for pain and tingling in toes, worse at night.  Cardiac: [ ]  Chest pain, [ ]   Shortness of breath when lying flat, [ ]  Shortness of breath with exertion, [ ]  Palpitations, [ ]  Heart murmur, [ ]   Atrial fibrillation  Hematologic:[ ]  Easy Bruising, [ ]  Anemia; [ ]  Hepatitis  Psychiatric: [ ]   Depression, [ ]  Anxiety   Gastrointestinal: [ ]  Black stool, [ ]  Blood in stool, [ ]  Peptic ulcer disease,  [ ]  Gastroesophageal Reflux, [ ]  Trouble swallowing, [ ]  Diarrhea, [ ]  Constipation  Urinary: [ ]  chronic Kidney disease, [ ]  on HD, [ ]  Burning with urination, [ ]  Frequent urination, [ ]  Difficulty urinating;   Skin: [ ]  Rashes, [ ]  Wounds    Physical Examination Filed Vitals:   11/13/12 1202  BP: 126/86  Pulse: 64  Resp:    Filed Weights   11/13/12 1157  Weight: 180 lb (81.647 kg)   Body mass index is 28.19 kg/(m^2).  General: WDWN male in NAD GAIT: normal Eyes: PERRLA Pulmonary:  CTAB, Negative  Rales, Negative rhonchi, & Negative wheezing.  Cardiac: regular Rhythm ,  Negative Murmurs.  VASCULAR EXAM Carotid Bruits Left Right   Negative Negative                                                                                                                                LE Pulses LEFT RIGHT       FEMORAL   palpable   palpable        POPLITEAL  not palpable   not palpable       POSTERIOR TIBIAL   palpable    palpable        DORSALIS PEDIS      ANTERIOR TIBIAL not palpable   palpable     Gastrointestinal: soft, nontender, BS WNL, no r/g,  negative masses.  Musculoskeletal: Negative muscle atrophy/wasting. M/S 5/5 throughout, Extremities without ischemic changes.  Neurologic:  A&O X 3; Appropriate Affect ; SENSATION ;normal;  Speech is normal CN 2-12 intact, Pain and light touch intact in extremities, Motor exam as listed above.   Non-Invasive Vascular Imaging CAROTID DUPLEX 11/13/2012   Right ICA patent with no evidence of restenosis Left ICA 40 - 59 % stenosis  These findings are Unchanged from previous exam   Assessment: NILES ESS is a 62 y.o. male who presents with:Asymptomaticpatent right internal carotid artery post CEA and left internal carotid artery remains at 40-59%, mild to moderate, stenosis. The  ICA stenosis is  Unchanged from previous exam.  Plan: Based on today's exam and Duplex study, patient was advised to follow-up in 1 year with Carotid Duplex scan.  I discussed in depth with the patient the nature of atherosclerosis, and emphasized the importance of maximal medical management including strict control of blood pressure, blood glucose, and lipid levels, obtaining regular exercise, and continued cessation of smoking.  The patient is aware that without maximal medical management the underlying atherosclerotic disease process will progress, limiting the benefit of any interventions. The patient was given information about stroke prevention and what symptoms should prompt the patient to seek immediate medical care. Thank you for allowing [place] to participate in this patient's care.  Charisse March, RN, MSN, FNP-C [facility] Office: [phone]  Clinic Physician: Hart Rochester  11/13/2012 11:31 AM

## 2012-11-13 NOTE — Patient Instructions (Addendum)
Stroke Prevention Some medical conditions and behaviors are associated with an increased chance of having a stroke. You may prevent a stroke by making healthy choices and managing medical conditions. Reduce your risk of having a stroke by:  Staying physically active. Get at least 30 minutes of activity on most or all days.  Not smoking. It may also be helpful to avoid exposure to secondhand smoke.  Limiting alcohol use. Moderate alcohol use is considered to be:  No more than 2 drinks per day for men.  No more than 1 drink per day for nonpregnant women.  Eating healthy foods.  Include 5 or more servings of fruits and vegetables a day.  Certain diets may be prescribed to address high blood pressure, high cholesterol, diabetes, or obesity.  Managing your cholesterol levels.  A low-saturated fat, low-trans fat, low-cholesterol, and high-fiber diet may control cholesterol levels.  Take any prescribed medicines to control cholesterol as directed by your caregiver.  Managing your diabetes.  A controlled-carbohydrate, controlled-sugar diet is recommended to manage diabetes.  Take any prescribed medicines to control diabetes as directed by your caregiver.  Controlling your high blood pressure (hypertension).  A low-salt (sodium), low-saturated fat, low-trans fat, and low-cholesterol diet is recommended to manage high blood pressure.  Take any prescribed medicines to control hypertension as directed by your caregiver.  Maintaining a healthy weight.  A reduced-calorie, low-sodium, low-saturated fat, low-trans fat, low-cholesterol diet is recommended to manage weight.  Stopping drug abuse.  Avoiding birth control pills.  Talk to your caregiver about the risks of taking birth control pills if you are over 35 years old, smoke, get migraines, or have ever had a blood clot.  Getting evaluated for sleep disorders (sleep apnea).  Talk to your caregiver about getting a sleep evaluation  if you snore a lot or have excessive sleepiness.  Taking medicines as directed by your caregiver.  For some people, aspirin or blood thinners (anticoagulants) are helpful in reducing the risk of forming abnormal blood clots that can lead to stroke. If you have the irregular heart rhythm of atrial fibrillation, you should be on a blood thinner unless there is a good reason you cannot take them.  Understand all your medicine instructions. SEEK IMMEDIATE MEDICAL CARE IF:   You have sudden weakness or numbness of the face, arm, or leg, especially on one side of the body.  You have sudden confusion.  You have trouble speaking (aphasia) or understanding.  You have sudden trouble seeing in one or both eyes.  You have sudden trouble walking.  You have dizziness.  You have a loss of balance or coordination.  You have a sudden, severe headache with no known cause.  You have new chest pain or an irregular heartbeat. Any of these symptoms may represent a serious problem that is an emergency. Do not wait to see if the symptoms will go away. Get medical help right away. Call your local emergency services (911 in [place] Do not drive yourself to the hospital. Document Released: 03/24/2004 Document Revised: 05/09/2011 Document Reviewed: 10/04/2010 [employer] Patient Information 2014 [employer].  

## 2013-01-29 ENCOUNTER — Ambulatory Visit (INDEPENDENT_AMBULATORY_CARE_PROVIDER_SITE_OTHER): Payer: BC Managed Care – PPO | Admitting: Pharmacist

## 2013-01-29 ENCOUNTER — Encounter: Payer: Self-pay | Admitting: Cardiology

## 2013-01-29 ENCOUNTER — Ambulatory Visit (INDEPENDENT_AMBULATORY_CARE_PROVIDER_SITE_OTHER): Payer: BC Managed Care – PPO | Admitting: Cardiology

## 2013-01-29 VITALS — BP 124/76 | HR 63 | Ht 66.0 in | Wt 176.0 lb

## 2013-01-29 DIAGNOSIS — Z79899 Other long term (current) drug therapy: Secondary | ICD-10-CM

## 2013-01-29 DIAGNOSIS — IMO0001 Reserved for inherently not codable concepts without codable children: Secondary | ICD-10-CM

## 2013-01-29 DIAGNOSIS — E782 Mixed hyperlipidemia: Secondary | ICD-10-CM | POA: Insufficient documentation

## 2013-01-29 DIAGNOSIS — I10 Essential (primary) hypertension: Secondary | ICD-10-CM

## 2013-01-29 DIAGNOSIS — I219 Acute myocardial infarction, unspecified: Secondary | ICD-10-CM

## 2013-01-29 DIAGNOSIS — I2510 Atherosclerotic heart disease of native coronary artery without angina pectoris: Secondary | ICD-10-CM

## 2013-01-29 DIAGNOSIS — E785 Hyperlipidemia, unspecified: Secondary | ICD-10-CM

## 2013-01-29 HISTORY — DX: Atherosclerotic heart disease of native coronary artery without angina pectoris: I25.10

## 2013-01-29 MED ORDER — ROSUVASTATIN CALCIUM 10 MG PO TABS: ORAL_TABLET | ORAL | Status: DC

## 2013-01-29 MED ORDER — VITAMIN D 1000 UNITS PO TABS: 1000.0000 [IU] | ORAL_TABLET | Freq: Every day | ORAL | Status: DC

## 2013-01-29 MED ORDER — PLANT STEROLS AND STANOLS 450 MG PO TABS: ORAL_TABLET | ORAL | Status: DC

## 2013-01-29 NOTE — Patient Instructions (Signed)
Plan: 1.  Restart CholestOff 2 pills twice daily before breakfast and dinner. 2.  Start Crestor 10 mg once weekly. 3.  Reduce eggs in diet.  Egg whites are okay, but prefer to limit egg yolks to no more than 2 per week. 4.  Start Vitamin D 1,000 units daily 5.  Recheck cholesterol, liver, and vitamin D level in 3 months (04/29/13 - fasting), and see Riki Rusk Sayge Salvato 3 days later (05/02/13 at 9 am)

## 2013-01-29 NOTE — Progress Notes (Signed)
[address], [address] [place], [place]  [address] Phone: [phone] Fax:  [phone]  Date:  01/29/2013   ID:  [name], DOB June 06, 1950, MRN [id number]  PCP:   Duane Lope, MD   History of Present Illness: [name] is a 62 y.o. male with prior cardiac catheterization in October of 2009 following mildly positive troponin (isolated blood draw), mild non-ST elevation MI which revealed 60% right coronary artery stenosis. A nuclear stress test placed following this to determine if the lesion was flow limiting and stress test overall was reassuring. He exercised for 9 minutes.  Previously, had right carotid endarterectomy, amaurosis fugax.  He is had issues in the past tolerating statin therapy.  First thing he asked about was back pain and Tramadol and Percocet. Had 60 stolen out of his medicine cabinet. Dr. Tenny Craw has not been prescribing.   Overall he is not experiencing any anginal symptoms, no syncope, no palpitations. Doing well. He still enjoys playing golf quite frequently.   Wt Readings from Last 3 Encounters:  01/29/13 176 lb (79.833 kg)  11/13/12 180 lb (81.647 kg)  11/08/11 177 lb 11.2 oz (80.604 kg)     Past Medical History  Diagnosis Date  . Carotid artery occlusion   . Arthritis   . Heart attack Oct. 2009    Mild  . Hyperlipidemia   . Coronary atherosclerosis of native coronary artery 01/29/2013    Past Surgical History  Procedure Laterality Date  . Spine surgery    . Carotid endarterectomy  01/05/2006    Right  CEA with DPA    Current Outpatient Prescriptions  Medication Sig Dispense Refill  . amLODipine (NORVASC) 5 MG tablet Take 5 mg by mouth.       Marland Kitchen aspirin 81 MG tablet Take 81 mg by mouth daily.      . cyclobenzaprine (FLEXERIL) 10 MG tablet Take 1 tablet (10 mg total) by mouth 3 (three) times daily as needed for muscle spasms.  15 tablet  0  . fish oil-omega-3 fatty acids 1000 MG capsule Take 1 g by mouth daily.       Marland Kitchen  gabapentin (NEURONTIN) 300 MG capsule Take 300 mg by mouth 2 (two) times daily.       . Glucosamine-Chondroit-Vit C-Mn (GLUCOSAMINE 1500 COMPLEX PO) Take 1 tablet by mouth daily.       Marland Kitchen lisinopril (PRINIVIL,ZESTRIL) 10 MG tablet daily.      . metoprolol succinate (TOPROL-XL) 25 MG 24 hr tablet daily.      Marland Kitchen OVER THE COUNTER MEDICATION Take 30 mLs by mouth at bedtime as needed. Z Quil- for sleep      . Phenyleph-CPM-DM-APAP (ALKA-SELTZER PLUS COLD & FLU PO) Take 2 tablets by mouth every 6 (six) hours as needed. For cold symptoms      . psyllium (HYDROCIL/METAMUCIL) 95 % PACK Take 1 packet by mouth daily.       No current facility-administered medications for this visit.    Allergies:   No Known Allergies  Social History:  The patient  reports that he quit smoking about 7 years ago. His smoking use included Cigarettes. He smoked 0.00 packs per day. He uses smokeless tobacco. He reports that he drinks alcohol. He reports that he does not use illicit drugs.   ROS:  Please see the history of present illness.   Denies any fevers, chills, syncope, orthopnea, PND  PHYSICAL EXAM: VS:  BP 124/76  Pulse  63  Ht 5\' 6"  (1.676 m)  Wt 176 lb (79.833 kg)  BMI 28.42 kg/m2 Well nourished, well developed, in no acute distress HEENT: normal Neck: no JVD Cardiac:  normal S1, S2; RRR; no murmur Lungs:  clear to auscultation bilaterally, no wheezing, rhonchi or rales Abd: soft, nontender, no hepatomegaly Ext: no edema Skin: warm and dry Neuro: no focal abnormalities noted  EKG:  01/29/13: Sinus rhythm rate 68 with nonspecific T-wave changes.     ASSESSMENT AND PLAN:  1. Coronary artery disease-previously described as nonobstructive 60% RCA lesion with reassuring nuclear stress test showing no evidence of ischemia in that territory. Continuing with secondary prevention efforts. Unable to tolerate statin therapy. We have had lengthy discussions about this in the past. 2. Peripheral vascular  disease/carotid artery disease-prior right sided carotid endarterectomy. 3. Hypertension-multidrug regimen. Currently well controlled. 4. Hyperlipidemia-unable to tolerate statins as above. He has tried multiple statins. I would like for him to have consultation with [facility], Pharm.D., lipid clinic to see if there are some options for him.  Signed, Donato Schultz, MD [facility]  01/29/2013 9:17 AM

## 2013-01-29 NOTE — Assessment & Plan Note (Addendum)
Patient needs at least a 50% LDL reduction given non-obstructive CAD and h/o carotid endarectomy.  He hasn't been able to tolerate daily statins or Zetia, so will try once weekly Crestor (hepatic cholesterol reduction), get him back on CholestOff (dietary cholesterol reduction), and try to reduce the cholesterol in his breakfast.  Will assess Vitamin D in 3 months along with cholesterol, and if this is low, then increasing vitamin D intake may allow for higher Crestor doses to be used.   Plan: 1.  Restart CholestOff 2 pills twice daily before breakfast and dinner. 2.  Start Crestor 10 mg once weekly. 3.  Reduce eggs in diet.  Egg whites are okay, but prefer to limit egg yolks to no more than 2 per week. 4.  Start Vitamin D 1,000 units daily 5.  Recheck cholesterol, liver, and vitamin D level in 3 months (04/29/13 - fasting), and see Riki Rusk Thelia Tanksley 3 days later (05/02/13 at 9 am)

## 2013-01-29 NOTE — Patient Instructions (Signed)
Your physician wants you to follow-up in: 1 year with Dr. Algis Liming will receive a reminder letter in the mail two months in advance. If you don't receive a letter, please call our office to schedule the follow-up appointment.  Your physician recommends that you schedule a follow-up appointment with the Lipid Clinic    Your physician recommends that you continue on your current medications as directed. Please refer to the Current Medication list given to you today.

## 2013-01-29 NOTE — Progress Notes (Signed)
Patient being referred to me by Dr. Anne Fu given carotid disease and non-obstructive coronary disease and inability to tolerate statins.  His baseline LDL off medication has historically been between 160-200 mg/dL.  Last LDL was 160 mg/dL in 09/1189 by PCP.  Patient has failed multiple statins and Zetia, and states the muscle aches typically come on after only a few weeks of therapy.  Risk factors:  Non-obstructive CAD, h/o Carotid endarectomy, HTN, age - LDL goal < 70 mg/dL preferably, non-HDL goal < 100 Meds:  Fish oil 1 g/d.  Was on CholestOff but stopped this recently when he ran out.   Intolerant:  Crestor 20 mg qd, Pravastatin 40 mg qd, Lipitor, Zocor, Zetia - muscle aches.  Social history - drinks 1-2 glasses of wine daily.  Occasionally drinks beer.  No tobacco currently.   Family history - Father died ~ 11 y.o. From CVA.  Mother had heart disease as well.  Diet:  Breakfast - Eats eggs/bacon almost every morning as he is on a low carb diet with his wife.  Lunch - Salad.  Dinner - Fish or chicken and a vegetable.  Drinks very little soda, and rarely eats desserts. Exercise - Walks 3 miles every day, and plays golf 5 days per week.    Labs:  Last lipid panel from 03/2012  03/2012 - TC 260, TG 137, HDL 57, LDL 160, non-HDL 203 (not on meds) No evidence of a vitamin D level.  If low, this could contribute to his inability to tolerate daily statins.  Current Outpatient Prescriptions  Medication Sig Dispense Refill  . amLODipine (NORVASC) 5 MG tablet Take 5 mg by mouth.       Marland Kitchen aspirin 81 MG tablet Take 81 mg by mouth daily.      . cyclobenzaprine (FLEXERIL) 10 MG tablet Take 1 tablet (10 mg total) by mouth 3 (three) times daily as needed for muscle spasms.  15 tablet  0  . fish oil-omega-3 fatty acids 1000 MG capsule Take 1 g by mouth daily.       Marland Kitchen gabapentin (NEURONTIN) 300 MG capsule Take 300 mg by mouth 2 (two) times daily.       . Glucosamine-Chondroit-Vit C-Mn (GLUCOSAMINE 1500 COMPLEX  PO) Take 1 tablet by mouth daily.       Marland Kitchen lisinopril (PRINIVIL,ZESTRIL) 10 MG tablet daily.      . metoprolol succinate (TOPROL-XL) 25 MG 24 hr tablet daily.      Marland Kitchen OVER THE COUNTER MEDICATION Take 30 mLs by mouth at bedtime as needed. Z Quil- for sleep      . Phenyleph-CPM-DM-APAP (ALKA-SELTZER PLUS COLD & FLU PO) Take 2 tablets by mouth every 6 (six) hours as needed. For cold symptoms      . psyllium (HYDROCIL/METAMUCIL) 95 % PACK Take 1 packet by mouth daily.       No current facility-administered medications for this visit.   Allergies  Allergen Reactions  . Statins     Failed Crestor 20 mg daily, Pravastatin 40 mg qd, Lipitor, Zocor - muscle aches  . Zetia [Ezetimibe]     Muscle aches   Family History  Problem Relation Age of Onset  . Heart attack Mother   . Coronary artery disease Mother   . Heart disease Mother     Carotid Stenosis and BPG and Heart Disease before age 28  . Diabetes Mother   . Hypertension Mother   . Heart attack Father   . Heart disease Father  BPG and Heart Disease before age 47  . Cancer Father   . Hypertension Father

## 2013-01-30 ENCOUNTER — Ambulatory Visit: Payer: BC Managed Care – PPO | Admitting: Pharmacist

## 2013-04-22 ENCOUNTER — Other Ambulatory Visit: Payer: Self-pay | Admitting: Family

## 2013-04-22 DIAGNOSIS — Z48812 Encounter for surgical aftercare following surgery on the circulatory system: Secondary | ICD-10-CM

## 2013-04-22 DIAGNOSIS — I6529 Occlusion and stenosis of unspecified carotid artery: Secondary | ICD-10-CM

## 2013-04-29 ENCOUNTER — Other Ambulatory Visit: Payer: BC Managed Care – PPO

## 2013-04-29 ENCOUNTER — Telehealth: Payer: Self-pay | Admitting: Cardiology

## 2013-04-29 NOTE — Telephone Encounter (Signed)
New Message  Pt states that he will have a physical exam on 05/09/2013 with Dr. Harle Battiest. He is requesting a call back to discuss if a lab is needed//Please call

## 2013-04-29 NOTE — Telephone Encounter (Signed)
Left message for pt, his appt on the 5th is with jeremy smart in the lipid clinic. He is in for lab work today or tomorrow. He will need to be fasting. Pt to call back with questions.

## 2013-05-02 ENCOUNTER — Ambulatory Visit: Payer: BC Managed Care – PPO | Admitting: Pharmacist

## 2013-05-06 ENCOUNTER — Other Ambulatory Visit: Payer: Self-pay | Admitting: Cardiology

## 2013-05-09 ENCOUNTER — Ambulatory Visit (INDEPENDENT_AMBULATORY_CARE_PROVIDER_SITE_OTHER): Payer: BC Managed Care – PPO | Admitting: Pharmacist

## 2013-05-09 VITALS — Wt 182.0 lb

## 2013-05-09 DIAGNOSIS — Z79899 Other long term (current) drug therapy: Secondary | ICD-10-CM

## 2013-05-09 DIAGNOSIS — E785 Hyperlipidemia, unspecified: Secondary | ICD-10-CM

## 2013-05-09 MED ORDER — ROSUVASTATIN CALCIUM 10 MG PO TABS
ORAL_TABLET | ORAL | Status: DC
Start: 2013-05-09 — End: 2013-08-22

## 2013-05-09 MED ORDER — ROSUVASTATIN CALCIUM 10 MG PO TABS: ORAL_TABLET | ORAL | Status: DC

## 2013-05-09 NOTE — Progress Notes (Signed)
Patient is a pleasant 63 y.o. WM referred to me by Dr. Marlou Porch given carotid disease and non-obstructive coronary disease and inability to tolerate statins, who is here for a f/u visit for his hyperlipidemia.  His baseline LDL off medication has historically been between 160-200 mg/dL.  Last LDL was 160 mg/dL in 03/2012 by PCP.  He started Crestor 5 mg once weekly 3 months ago, and is tolerating this well.  He is taking Vitamin D 1,000 units daily, and his recent Vitamin D 25-OH level was normal (~ 40.0 with PCP).   Patient has failed multiple statins and Zetia, and states the muscle aches typically come on after only a few weeks of therapy.  Patient has gained 5 lbs over past 4 months.   Risk factors:  Non-obstructive CAD, h/o Carotid endarectomy, HTN, age - LDL goal < 70 mg/dL preferably, non-HDL goal < 100 Meds: Crestor 5 mg once weekly. Fish oil 1 g/d.  CholestOff, but only taking a few times a week.   Intolerant:  Crestor 20 mg qd, Pravastatin 40 mg qd, Lipitor, Zocor, Zetia - muscle aches.  Social history - drinks 1-2 glasses of wine daily.  Occasionally drinks beer.  No tobacco currently.   Family history - Father died ~ 76 y.o. From CVA.  Mother had heart disease as well.  Diet:  Breakfast - Much improved.  Was eating bacon/eggs daily, and now only eating this once weekly.  Eating cereal and fruit other days of the week.  Lunch - Salad.  Dinner - Fish or chicken and a vegetable.  Drinks very little soda, and rarely eats desserts. Exercise - Walks 3 miles every day, and plays golf 5 days per week.    Labs:  04/2013 - TC 241, TG 132, HDL 52, LDL 162, non-HDL 189 (Crestor 5 mg once weekly) 03/2012 - TC 260, TG 137, HDL 57, LDL 160, non-HDL 203 (not on meds; baseline LDL b/t 160-200 mg/dL historically) No evidence of a vitamin D level.  If low, this could contribute to his inability to tolerate daily statins.  Current Outpatient Prescriptions  Medication Sig Dispense Refill  . amLODipine (NORVASC)  5 MG tablet Take 5 mg by mouth.       Marland Kitchen aspirin 81 MG tablet Take 81 mg by mouth daily.      . cholecalciferol (VITAMIN D) 1000 UNITS tablet Take 1 tablet (1,000 Units total) by mouth daily.      . cyclobenzaprine (FLEXERIL) 10 MG tablet Take 1 tablet (10 mg total) by mouth 3 (three) times daily as needed for muscle spasms.  15 tablet  0  . fish oil-omega-3 fatty acids 1000 MG capsule Take 1 g by mouth daily.       Marland Kitchen gabapentin (NEURONTIN) 300 MG capsule Take 300 mg by mouth 2 (two) times daily.       . Glucosamine-Chondroit-Vit C-Mn (GLUCOSAMINE 1500 COMPLEX PO) Take 1 tablet by mouth daily.       Marland Kitchen lisinopril (PRINIVIL,ZESTRIL) 10 MG tablet TAKE 1 TABLET BY MOUTH ONCE DAILY.  90 tablet  2  . metoprolol succinate (TOPROL-XL) 25 MG 24 hr tablet daily.      Marland Kitchen OVER THE COUNTER MEDICATION Take 30 mLs by mouth at bedtime as needed. Z Quil- for sleep      . Phenyleph-CPM-DM-APAP (ALKA-SELTZER PLUS COLD & FLU PO) Take 2 tablets by mouth every 6 (six) hours as needed. For cold symptoms      . Plant Sterols and Stanols 450 MG  TABS Take 2 tablets twice daily prior to largest meals    0  . psyllium (HYDROCIL/METAMUCIL) 95 % PACK Take 1 packet by mouth daily.      . rosuvastatin (CRESTOR) 10 MG tablet Take 5 mg by mouth once a week. Take 1 tablet once weekly       No current facility-administered medications for this visit.   Allergies  Allergen Reactions  . Statins     Failed Crestor 20 mg daily, Pravastatin 40 mg qd, Lipitor, Zocor - muscle aches  . Zetia [Ezetimibe]     Muscle aches   Family History  Problem Relation Age of Onset  . Heart attack Mother   . Coronary artery disease Mother   . Heart disease Mother     Carotid Stenosis and BPG and Heart Disease before age 52  . Diabetes Mother   . Hypertension Mother   . Heart attack Father   . Heart disease Father     BPG and Heart Disease before age 59  . Cancer Father   . Hypertension Father

## 2013-05-09 NOTE — Assessment & Plan Note (Addendum)
Patient tolerating Crestor 5 mg once weekly, but needs significantly more LDL reduction.  Will continue to slowly titrate Crestor to tiw.  Patient to continue exercise, and hopefully lose 5-10 lbs over next few months.  Will increase to Crestor twice weekly for 4 weeks, then up to tiw thereater.  We discussed PCSK-9 inhibitor study which he was interested in, but would have to see if his non-obstructive disease would be enough to qualify him in case Crestor qod isn't potent enough, or not tolerated moving forward.   Plan: 1.  Increase Crestor to 5 mg twice weekly for 4 weeks. 2.  After 4 weeks, increase to Crestor 5 mg three times weekly. 3.  Continue vitamin D 1,000 units daily. 4.  Continue avoiding eggs/sausage consumption 5. Continue daily exercise. 6.  Try to use CholestOff daily. 7.  Recheck cholesterol in 3 months (08/12/13 - fasting lab, show up anytime after 7:30 am); see Ysidro Evert the next day on 08/13/13 at 9:30 am

## 2013-05-09 NOTE — Patient Instructions (Signed)
1.  Increase Crestor to 5 mg twice weekly for 4 weeks. 2.  After 4 weeks, increase to Crestor 5 mg three times weekly. 3.  Continue vitamin D 1,000 units daily. 4.  Continue avoiding eggs/sausage consumption 5. Continue daily exercise. 6.  Try to use CholestOff daily. 7.  Recheck cholesterol in 3 months (08/12/13 - fasting lab, show up anytime after 7:30 am); see Ysidro Evert the next day on 08/13/13 at 9:30 am

## 2013-07-17 ENCOUNTER — Other Ambulatory Visit: Payer: Self-pay | Admitting: Cardiology

## 2013-08-12 ENCOUNTER — Other Ambulatory Visit: Payer: BC Managed Care – PPO

## 2013-08-13 ENCOUNTER — Ambulatory Visit: Payer: BC Managed Care – PPO | Admitting: Pharmacist

## 2013-08-13 ENCOUNTER — Other Ambulatory Visit (INDEPENDENT_AMBULATORY_CARE_PROVIDER_SITE_OTHER): Payer: BC Managed Care – PPO

## 2013-08-13 DIAGNOSIS — Z79899 Other long term (current) drug therapy: Secondary | ICD-10-CM

## 2013-08-13 DIAGNOSIS — IMO0001 Reserved for inherently not codable concepts without codable children: Secondary | ICD-10-CM

## 2013-08-13 DIAGNOSIS — E785 Hyperlipidemia, unspecified: Secondary | ICD-10-CM

## 2013-08-13 LAB — HEPATIC FUNCTION PANEL
ALBUMIN: 4.1 g/dL (ref 3.5–5.2)
ALK PHOS: 67 U/L (ref 39–117)
ALT: 29 U/L (ref 0–53)
AST: 24 U/L (ref 0–37)
Bilirubin, Direct: 0.1 mg/dL (ref 0.0–0.3)
TOTAL PROTEIN: 7.1 g/dL (ref 6.0–8.3)
Total Bilirubin: 0.4 mg/dL (ref 0.2–1.2)

## 2013-08-13 LAB — LIPID PANEL
Cholesterol: 242 mg/dL — ABNORMAL HIGH (ref 0–200)
HDL: 74.8 mg/dL (ref >39.00)
LDL Cholesterol: 150 mg/dL — ABNORMAL HIGH (ref 0–99)
NonHDL: 167.2
TRIGLYCERIDES: 88 mg/dL (ref 0.0–149.0)
Total CHOL/HDL Ratio: 3
VLDL: 17.6 mg/dL (ref 0.0–40.0)

## 2013-08-14 LAB — VITAMIN D 25 HYDROXY (VIT D DEFICIENCY, FRACTURES): VIT D 25 HYDROXY: 83 ng/mL (ref 30–89)

## 2013-08-22 ENCOUNTER — Ambulatory Visit (INDEPENDENT_AMBULATORY_CARE_PROVIDER_SITE_OTHER): Payer: BC Managed Care – PPO | Admitting: Pharmacist

## 2013-08-22 VITALS — Wt 175.0 lb

## 2013-08-22 DIAGNOSIS — Z79899 Other long term (current) drug therapy: Secondary | ICD-10-CM

## 2013-08-22 DIAGNOSIS — E785 Hyperlipidemia, unspecified: Secondary | ICD-10-CM

## 2013-08-22 MED ORDER — ROSUVASTATIN CALCIUM 10 MG PO TABS: 5.0000 mg | ORAL_TABLET | Freq: Every day | ORAL | Status: DC

## 2013-08-22 NOTE — Progress Notes (Signed)
Patient is a pleasant 63 y.o. WM referred to me by Dr. Marlou Porch given carotid disease and non-obstructive coronary disease and inability to tolerate statins, who is here for a f/u visit for his hyperlipidemia.  Since last visit his Crestor has been increased from once weekly up to three times per week.  His baseline LDL off medication has historically been between 160-200 mg/dL.  Last LDL was 160 mg/dL in 03/2012 by PCP, and today it is 150 mg/dL.  His non-HDL also dropped from 189 down to 167 mg/dL given such improvement in his TG and HDL. He is currently on Crestor 5 mg tiw, and is tolerating this well.  He is taking Vitamin D 1,000 units daily, and his recent Vitamin D 25-OH level was normal (83 ng/mL)  Patient has failed multiple statins and Zetia, and states the muscle aches typically come on after only a few weeks of therapy.  Patient has lost 7 lbs over past 3 months as working outside more.   Risk factors:  Non-obstructive CAD, h/o Carotid endarectomy, HTN, age - LDL goal < 70 mg/dL preferably, non-HDL goal < 100 Meds: Crestor 5 mg three times per week. Fish oil 1 g/d.  CholestOff, but only taking a few times a week.   Intolerant:  Crestor 20 mg qd, Pravastatin 40 mg qd, Lipitor, Zocor, Zetia - muscle aches.  Social history - drinks 1-2 glasses of wine daily.  Occasionally drinks beer.  No tobacco currently.   Family history - Father died ~ 60 y.o. From CVA.  Mother had heart disease as well.  Diet:  Breakfast - Much improved.  Was eating bacon/eggs daily, and now only eating this once weekly.  Eating cereal and fruit other days of the week.  Lunch - Salad.  Dinner - Fish or chicken and a vegetable.  Drinks very little soda, and rarely eats desserts. Exercise - Walks 3 miles every day, and plays golf 5 days per week.    Labs:  07/2013:  TC 242, TG 88, HDL 75, LDL 150, non-HDL 167, LFTs normal, Vitamin D 83 (Crestor 5 mg tiw) 04/2013 - TC 241, TG 132, HDL 52, LDL 162, non-HDL 189 (Crestor 5 mg once  weekly) 04/2013 (PCP) - CRP was 5.74 03/2012 - TC 260, TG 137, HDL 57, LDL 160, non-HDL 203 (not on meds; baseline LDL b/t 160-200 mg/dL historically) No evidence of a vitamin D level.  If low, this could contribute to his inability to tolerate daily statins.  Current Outpatient Prescriptions  Medication Sig Dispense Refill  . amLODipine (NORVASC) 5 MG tablet Take 5 mg by mouth.       Marland Kitchen aspirin 81 MG tablet Take 81 mg by mouth daily.      . cholecalciferol (VITAMIN D) 1000 UNITS tablet Take 1 tablet (1,000 Units total) by mouth daily.      . cyclobenzaprine (FLEXERIL) 10 MG tablet Take 1 tablet (10 mg total) by mouth 3 (three) times daily as needed for muscle spasms.  15 tablet  0  . fish oil-omega-3 fatty acids 1000 MG capsule Take 1 g by mouth daily.       Marland Kitchen gabapentin (NEURONTIN) 300 MG capsule Take 300 mg by mouth 2 (two) times daily.       . Glucosamine-Chondroit-Vit C-Mn (GLUCOSAMINE 1500 COMPLEX PO) Take 1 tablet by mouth daily.       Marland Kitchen lisinopril (PRINIVIL,ZESTRIL) 10 MG tablet TAKE 1 TABLET BY MOUTH ONCE DAILY.  90 tablet  2  . metoprolol  succinate (TOPROL-XL) 25 MG 24 hr tablet TAKE 1 TABLET BY MOUTH ONCE DAILY.  90 tablet  1  . OVER THE COUNTER MEDICATION Take 30 mLs by mouth at bedtime as needed. Z Quil- for sleep      . Phenyleph-CPM-DM-APAP (ALKA-SELTZER PLUS COLD & FLU PO) Take 2 tablets by mouth every 6 (six) hours as needed. For cold symptoms      . Plant Sterols and Stanols 450 MG TABS Take 2 tablets twice daily prior to largest meals    0  . psyllium (HYDROCIL/METAMUCIL) 95 % PACK Take 1 packet by mouth daily.      . rosuvastatin (CRESTOR) 10 MG tablet Take 1/2 tablet three times a week  30 tablet  5   No current facility-administered medications for this visit.   Allergies  Allergen Reactions  . Statins     Failed Crestor 20 mg daily, Pravastatin 40 mg qd, Lipitor, Zocor - muscle aches  . Zetia [Ezetimibe]     Muscle aches   Family History  Problem Relation Age of  Onset  . Heart attack Mother   . Coronary artery disease Mother   . Heart disease Mother     Carotid Stenosis and BPG and Heart Disease before age 88  . Diabetes Mother   . Hypertension Mother   . Heart attack Father   . Heart disease Father     BPG and Heart Disease before age 35  . Cancer Father   . Hypertension Father

## 2013-08-22 NOTE — Assessment & Plan Note (Addendum)
Patient so far is tolerating Crestor 5 mg as we have slowly titrated his dose, and currently on 5 mg tiw.  Will continue to increase this every month by 1 pill a week, until he is up to 5 mg qday in 4 months from now.  If he gets muscle aches he will reduce down again and call me to let me know.  He is very interested in PCSK-9 inhibitor trial given his inability to tolerate high dose statin, and fearful of cost of medication as turning 63 y.o. Soon.  Will hopefully tolerate Crestor 5 mg daily, but if he doesn't we may be able to get him into SPIRE-II.  Will check with Dr. Lia Foyer regarding eligibility given h/o endarterectomy 2007, non-obstructive CAD, and elevated CRP.  Will see patient back in 4 months unless I hear from him sooner due to intolerance.   Plan: 1.  Increase Crestor to 4 times per week today, then to 5 times per week the next month, then 6 times per week the next month, then once daily 2.  Call if you call have side effects on Crestor.  If that occurs, back your dose down to to previous dose.  May consider clinical trial if that occurs. 3.  Recheck cholesterol and liver in 4 months, and see Ysidro Evert 2 days later (12/10/13 lab / also have BP check at 9:00 am with nurse - come fasting), see Ysidro Evert 12/12/13 at 9:30 am

## 2013-08-22 NOTE — Patient Instructions (Addendum)
1.  Increase Crestor to 4 times per week today, then to 5 times per week the next month, then 6 times per week the next month, then once daily 2.  Call if you call have side effects on Crestor.  If that occurs, back your dose down to to previous dose.  May consider clinical trial if that occurs. 3.  Recheck cholesterol and liver in 4 months, and see Ysidro Evert 2 days later (12/10/13 lab / also have BP check at 9:00 am with nurse - come fasting), see Ysidro Evert 12/12/13 at 9:30 am  Call Ysidro Evert at [phone] if problems.

## 2013-09-03 ENCOUNTER — Ambulatory Visit: Payer: BC Managed Care – PPO | Admitting: Pharmacist

## 2013-09-25 ENCOUNTER — Telehealth: Payer: Self-pay | Admitting: Pharmacist

## 2013-09-25 NOTE — Telephone Encounter (Signed)
Patient called to tell me that he developed muscle aches in his legs after increasing Crestor 5 mg three times per week, up to 4 times per week.  It is a similar muscle discomfort he got on previous statins and Zetia.  He would like to know what his other options are.  He has non-obstructive CAD and h/o carotid disease s/p endarterectomy and an LDL of 150 mg/dL despite Crestor 5 mg tiw.  Since he can't titrate this further, or tolerate other agents, patient and I discussed Praluent which he is excited about.  He would like to come in and talk more about this Friday.  He has [employer] with [employer] so coverage should not be an issue.    Risk factors: Non-obstructive CAD, h/o Carotid endarterectomy, HTN, age - LDL goal < 70 mg/dL preferably, non-HDL goal < 100  Meds: Crestor 5 mg three times per week. Fish oil 1 g/d. CholestOff, but only taking a few times a week.  Intolerant: Crestor 20 mg qd, Pravastatin 40 mg qd, Lipitor, Zocor, Zetia - muscle aches.  He is to continue Crestor 5 mg three times per week, and will try to get him started on Praluent also - he will f/u with me in 2 days.

## 2013-09-27 ENCOUNTER — Telehealth: Payer: Self-pay | Admitting: Pharmacist

## 2013-09-27 DIAGNOSIS — E785 Hyperlipidemia, unspecified: Secondary | ICD-10-CM

## 2013-09-27 DIAGNOSIS — Z79899 Other long term (current) drug therapy: Secondary | ICD-10-CM

## 2013-09-27 MED ORDER — AMBULATORY NON FORMULARY MEDICATION: 75.0000 mg | Status: DC

## 2013-09-27 NOTE — Telephone Encounter (Signed)
Patient came in today for Praluent 75 mg q 2 week counseling.  He is on Crestor tiw and LDL remains 150 mg/dL with CAD.  Patient taught how to administer, given samples for 1 month, and paperwork filled out.  Will recheck lipid / liver in 6 weeks to determine if any problems, getting adequate LDL lowering, and to see if higher dose of Praluent needed.  Lab work 11/06/13 and see me 11/07/13 at 9:30 am.    To Dr. Marlou Porch as Juluis Rainier

## 2013-09-27 NOTE — Telephone Encounter (Signed)
Thank you Jeremy!

## 2013-11-06 ENCOUNTER — Other Ambulatory Visit (INDEPENDENT_AMBULATORY_CARE_PROVIDER_SITE_OTHER): Payer: BC Managed Care – PPO

## 2013-11-06 DIAGNOSIS — Z79899 Other long term (current) drug therapy: Secondary | ICD-10-CM

## 2013-11-06 DIAGNOSIS — E785 Hyperlipidemia, unspecified: Secondary | ICD-10-CM

## 2013-11-06 LAB — HEPATIC FUNCTION PANEL
ALBUMIN: 3.9 g/dL (ref 3.5–5.2)
ALK PHOS: 74 U/L (ref 39–117)
ALT: 27 U/L (ref 0–53)
AST: 24 U/L (ref 0–37)
BILIRUBIN DIRECT: 0.1 mg/dL (ref 0.0–0.3)
BILIRUBIN TOTAL: 0.6 mg/dL (ref 0.2–1.2)
Total Protein: 7.4 g/dL (ref 6.0–8.3)

## 2013-11-06 LAB — LIPID PANEL
CHOL/HDL RATIO: 3
Cholesterol: 152 mg/dL (ref 0–200)
HDL: 52.4 mg/dL (ref >39.00)
LDL Cholesterol: 78 mg/dL (ref 0–99)
NONHDL: 99.6
Triglycerides: 109 mg/dL (ref 0.0–149.0)
VLDL: 21.8 mg/dL (ref 0.0–40.0)

## 2013-11-07 ENCOUNTER — Ambulatory Visit (INDEPENDENT_AMBULATORY_CARE_PROVIDER_SITE_OTHER): Payer: BC Managed Care – PPO | Admitting: Pharmacist

## 2013-11-07 VITALS — Wt 177.0 lb

## 2013-11-07 DIAGNOSIS — Z79899 Other long term (current) drug therapy: Secondary | ICD-10-CM

## 2013-11-07 DIAGNOSIS — E785 Hyperlipidemia, unspecified: Secondary | ICD-10-CM

## 2013-11-07 NOTE — Assessment & Plan Note (Signed)
LDL improved significantly from 150 mg/dL down to 78 mg/dL since adding Praluent 75 mg q 2 weeks.  Patient's paperwork is still being processed by insurance company and will likely get enrolled into the [employer] with My[employer] soon.  Patient given samples of Praluent 75 mg #2 again today.  Will recheck lipid / liver in 3 months and should be approved for Praluent at that time.  Will make a decision on Praluent 75 mg vs 150 mg q 2 weeks at that time.  Patient agreeable to plan.

## 2013-11-07 NOTE — Progress Notes (Signed)
Patient is a pleasant 63 y.o. WM referred to me by Dr. Marlou Porch given carotid disease and non-obstructive coronary disease and inability to tolerate daily statins, who is here for a f/u visit for his hyperlipidemia.  Patient was taking Crestor 5 mg three times per week earlier this year and wasn't able to tolerate higher doses.  Patient was started on Praluent 75 mg twice monthly on 08/22/13 as well given LDL 150 mg/dL despite Crestor use.  His muscle aches worsened over past month which were similar to his previous statin myalgia, so he reduced his Crestor down to twice weekly, then ultimately down to once weekly.  He doesn't get muscle aches on once weekly Crestor 5 mg, so this appears Crestor once weekly is his highest tolerated dose of statin.    His LDL has dropped from 150 mg/dL down to 78 mg/dL since Praluent 75 mg q 2 weeks was started.   Risk factors:  Non-obstructive CAD, h/o Carotid endarectomy, HTN, age - LDL goal < 70 mg/dL preferably, non-HDL goal < 100 Meds: Crestor 5 mg once weekly, Fish oil, Praluent 75 mg q 2 weeks. Intolerant:  Crestor 5 mg twice weekly, Crestor 5 mg tiw, Crestor 20 mg qd, Pravastatin 40 mg qd, Lipitor, Zocor, Zetia - muscle aches.  Social history - drinks 1-2 glasses of wine daily.  Occasionally drinks beer.  No tobacco currently.   Family history - Father died ~ 12 y.o. From CVA.  Mother had heart disease as well.  Diet:  Breakfast - Much improved.  Was eating bacon/eggs daily, and now only eating this once weekly.  Eating cereal and fruit other days of the week.  Lunch - Salad.  Dinner - Fish or chicken and a vegetable.  Drinks very little soda, and rarely eats desserts. Exercise - Walks 3 miles every day, and plays golf 5 days per week.    Labs:  10/2013:  TC 152, TG 109, HDL 52, LDL 78, non-HDL 100, LFTs normal (Crestor 5 mg qweek, Praluent 75 mg q 2 weeks) 07/2013:  TC 242, TG 88, HDL 75, LDL 150, non-HDL 167, LFTs normal, Vitamin D 83 (Crestor 5 mg tiw) 04/2013 -  TC 241, TG 132, HDL 52, LDL 162, non-HDL 189 (Crestor 5 mg once weekly) 04/2013 (PCP) - CRP was 5.74 03/2012 - TC 260, TG 137, HDL 57, LDL 160, non-HDL 203 (not on meds; baseline LDL b/t 160-200 mg/dL historically) No evidence of a vitamin D level.  If low, this could contribute to his inability to tolerate daily statins.  Current Outpatient Prescriptions  Medication Sig Dispense Refill  . Alirocumab (PRALUENT) 75 MG/ML SOPN Inject 75 mg into the skin every 14 (fourteen) days.      Marland Kitchen amLODipine (NORVASC) 5 MG tablet Take 5 mg by mouth.       Marland Kitchen aspirin 81 MG tablet Take 81 mg by mouth daily.      . cholecalciferol (VITAMIN D) 1000 UNITS tablet Take 1 tablet (1,000 Units total) by mouth daily.      . cyclobenzaprine (FLEXERIL) 10 MG tablet Take 1 tablet (10 mg total) by mouth 3 (three) times daily as needed for muscle spasms.  15 tablet  0  . fish oil-omega-3 fatty acids 1000 MG capsule Take 1 g by mouth daily.       Marland Kitchen gabapentin (NEURONTIN) 300 MG capsule Take 300 mg by mouth 2 (two) times daily.       . Glucosamine-Chondroit-Vit C-Mn (GLUCOSAMINE 1500 COMPLEX PO) Take 1  tablet by mouth daily.       Marland Kitchen lisinopril (PRINIVIL,ZESTRIL) 10 MG tablet TAKE 1 TABLET BY MOUTH ONCE DAILY.  90 tablet  2  . metoprolol succinate (TOPROL-XL) 25 MG 24 hr tablet TAKE 1 TABLET BY MOUTH ONCE DAILY.  90 tablet  1  . OVER THE COUNTER MEDICATION Take 30 mLs by mouth at bedtime as needed. Z Quil- for sleep      . Phenyleph-CPM-DM-APAP (ALKA-SELTZER PLUS COLD & FLU PO) Take 2 tablets by mouth every 6 (six) hours as needed. For cold symptoms      . Plant Sterols and Stanols 450 MG TABS Take 2 tablets twice daily prior to largest meals    0  . psyllium (HYDROCIL/METAMUCIL) 95 % PACK Take 1 packet by mouth daily.      . rosuvastatin (CRESTOR) 10 MG tablet Take 5 mg by mouth once a week.       No current facility-administered medications for this visit.   Allergies  Allergen Reactions  . Statins     Failed Crestor 5 mg  twice weekly, Crestor 20 mg daily, Pravastatin 40 mg qd, Lipitor, Zocor - muscle aches  . Zetia [Ezetimibe]     Muscle aches   Family History  Problem Relation Age of Onset  . Heart attack Mother   . Coronary artery disease Mother   . Heart disease Mother     Carotid Stenosis and BPG and Heart Disease before age 65  . Diabetes Mother   . Hypertension Mother   . Heart attack Father   . Heart disease Father     BPG and Heart Disease before age 13  . Cancer Father   . Hypertension Father

## 2013-11-07 NOTE — Patient Instructions (Signed)
1.  Continue Crestor 5 mg once weekly. 2.  Continue Praluent 75 mg twice monthly (1st of the month / middle of the month) 3.  Recheck cholesterol and liver in 3 months (01/27/14 - fasting labs - lab opens at 7:30 am); see Gay Filler and Dr. Marlou Porch (10:30 and 11:00 am)

## 2013-11-13 ENCOUNTER — Other Ambulatory Visit (HOSPITAL_COMMUNITY): Payer: BC Managed Care – PPO

## 2013-11-13 ENCOUNTER — Ambulatory Visit: Payer: BC Managed Care – PPO | Admitting: Family

## 2013-11-19 ENCOUNTER — Encounter: Payer: Self-pay | Admitting: Family

## 2013-11-19 ENCOUNTER — Telehealth: Payer: Self-pay | Admitting: Pharmacist

## 2013-11-19 NOTE — Telephone Encounter (Signed)
Called [employer] [employer] and got approval for Praluent for 6 months.  Reference number [id number].  Patient notified.  He will call back early 04/2014 to remind [place] to send in the continuation form to continue Praluent with [facility] [place].

## 2013-11-20 ENCOUNTER — Ambulatory Visit (INDEPENDENT_AMBULATORY_CARE_PROVIDER_SITE_OTHER): Payer: BC Managed Care – PPO | Admitting: Family

## 2013-11-20 ENCOUNTER — Encounter: Payer: Self-pay | Admitting: Family

## 2013-11-20 ENCOUNTER — Ambulatory Visit (HOSPITAL_COMMUNITY)
Admission: RE | Admit: 2013-11-20 | Discharge: 2013-11-20 | Disposition: A | Discharge status: Home / Self Care | Payer: BC Managed Care – PPO | Source: Ambulatory Visit | Attending: Family | Admitting: Family

## 2013-11-20 VITALS — BP 143/91 | HR 57 | Resp 14 | Ht 66.0 in | Wt 178.0 lb

## 2013-11-20 DIAGNOSIS — Z48812 Encounter for surgical aftercare following surgery on the circulatory system: Secondary | ICD-10-CM | POA: Diagnosis not present

## 2013-11-20 DIAGNOSIS — I6529 Occlusion and stenosis of unspecified carotid artery: Secondary | ICD-10-CM

## 2013-11-20 NOTE — Patient Instructions (Addendum)
Stroke Prevention Some medical conditions and behaviors are associated with an increased chance of having a stroke. You may prevent a stroke by making healthy choices and managing medical conditions. HOW CAN I REDUCE MY RISK OF HAVING A STROKE?   Stay physically active. Get at least 30 minutes of activity on most or all days.  Do not smoke. It may also be helpful to avoid exposure to secondhand smoke.  Limit alcohol use. Moderate alcohol use is considered to be:  No more than 2 drinks per day for men.  No more than 1 drink per day for nonpregnant women.  Eat healthy foods. This involves:  Eating 5 or more servings of fruits and vegetables a day.  Making dietary changes that address high blood pressure (hypertension), high cholesterol, diabetes, or obesity.  Manage your cholesterol levels.  Making food choices that are high in fiber and low in saturated fat, trans fat, and cholesterol may control cholesterol levels.  Take any prescribed medicines to control cholesterol as directed by your health care provider.  Manage your diabetes.  Controlling your carbohydrate and sugar intake is recommended to manage diabetes.  Take any prescribed medicines to control diabetes as directed by your health care provider.  Control your hypertension.  Making food choices that are low in salt (sodium), saturated fat, trans fat, and cholesterol is recommended to manage hypertension.  Take any prescribed medicines to control hypertension as directed by your health care provider.  Maintain a healthy weight.  Reducing calorie intake and making food choices that are low in sodium, saturated fat, trans fat, and cholesterol are recommended to manage weight.  Stop drug abuse.  Avoid taking birth control pills.  Talk to your health care provider about the risks of taking birth control pills if you are over 35 years old, smoke, get migraines, or have ever had a blood clot.  Get evaluated for sleep  disorders (sleep apnea).  Talk to your health care provider about getting a sleep evaluation if you snore a lot or have excessive sleepiness.  Take medicines only as directed by your health care provider.  For some people, aspirin or blood thinners (anticoagulants) are helpful in reducing the risk of forming abnormal blood clots that can lead to stroke. If you have the irregular heart rhythm of atrial fibrillation, you should be on a blood thinner unless there is a good reason you cannot take them.  Understand all your medicine instructions.  Make sure that other conditions (such as anemia or atherosclerosis) are addressed. SEEK IMMEDIATE MEDICAL CARE IF:   You have sudden weakness or numbness of the face, arm, or leg, especially on one side of the body.  Your face or eyelid droops to one side.  You have sudden confusion.  You have trouble speaking (aphasia) or understanding.  You have sudden trouble seeing in one or both eyes.  You have sudden trouble walking.  You have dizziness.  You have a loss of balance or coordination.  You have a sudden, severe headache with no known cause.  You have new chest pain or an irregular heartbeat. Any of these symptoms may represent a serious problem that is an emergency. Do not wait to see if the symptoms will go away. Get medical help at once. Call your local emergency services (911 in [place] Do not drive yourself to the hospital. Document Released: 03/24/2004 Document Revised: 07/01/2013 Document Reviewed: 08/17/2012 ExitCare Patient Information 2015 [employer]. This information is not intended to replace advice given   to you by your health care provider. Make sure you discuss any questions you have with your health care provider.    Smokeless Tobacco Use Smokeless tobacco is a loose, fine, or stringy tobacco. The tobacco is not smoked like a cigarette, but it is chewed or held in the lips or cheeks. It resembles tea and comes from the  leaves of the tobacco plant. Smokeless tobacco is usually flavored, sweetened, or processed in some way. Although smokeless tobacco is not smoked into the lungs, its chemicals are absorbed through the membranes in the mouth and into the bloodstream. Its chemicals are also swallowed in saliva. The chemicals (nicotine and other toxins) are known to cause cancer. Smokeless tobacco contains up to 28 differentcarcinogens. CAUSES Nicotine is addictive. Smokeless tobacco contains nicotine, which is a stimulant. This stimulant can give you a "buzz" or altered state. People can become addicted to the feeling it delivers.  SYMPTOMS Smokeless tobacco can cause health problems, including:  Bad breath.  Yellow-brown teeth.  Mouth sores.  Cracking and bleeding lips.  Gum disease, gum recession, and bone loss around the teeth.  Tooth decay.  Increased or irregular heart rate.  High blood pressure, heart disease, and stroke.  Cancer of the mouth, lips, tongue, pancreas, voice box (larynx), esophagus, colon, and bladder.  Precancerous lesion of the soft tissues of the mouth (leukoplakia).  Loss of your sense of taste. TREATMENT Talk with your caregiver about ways you can quit. Quitting tobacco is a good decision for your health. Nicotine is addictive, but several options are available to help you quit including:  Nicotine replacement therapy (gum or patch).  Support and cessation programs. The following tips can help you quit:  Write down the reasons you would like to quit and look at them often.  Set a date during a low stress time to stop or cut back.  Ask family and friends for their support.  Remove all tobacco products from your home and work.  Replace the chewing tobacco with things like beef jerky, sunflower seeds, or shredded coconut.  Avoid situations that may make you want to chew tobacco.  Exercise and eat a healthy diet.  When you crave tobacco, distract yourself with  drinking water, sugarless chewing gum, sugarless hard candy, exercising, or deep breathing. HOME CARE INSTRUCTIONS  See your dentist for regular oral health exams every 6 months.  Follow up with your caregiver as recommended. SEEK MEDICAL CARE OR DENTAL CARE IF:  You have bleeding or cracking lips, gums, or cheeks.  You have mouth sores, discolorations, or pain.  You have tooth pain.  You develop persistent irritation, burning, or sores in the mouth.  You have pain, tenderness, or numbness in the mouth.  You develop a lump, bumpy patch, or hardened skin inside the mouth.  The color changes inside your mouth (gray, white, or red spots).  You have difficulty chewing, swallowing, or speaking. Document Released: 07/19/2010 Document Revised: 05/09/2011 Document Reviewed: 07/19/2010 ExitCare Patient Information 2015 [employer]. This information is not intended to replace advice given to you by your health care provider. Make sure you discuss any questions you have with your health care provider.  

## 2013-11-20 NOTE — Progress Notes (Signed)
Established Carotid Patient   History of Present Illness  [name] is a 63 y.o. male patient of Dr. Donnetta Hutching who has a history of a right CEA with Dacron patch in 2007.  He returns today for follow up. He had 2 episodes of amaurosis fugax on one eye, he cannot remember which eye, before he had the CEA and has had no further episodes.  The patient denies facial drooping, denies hemiplegia, denies receptive or expressive aphasia.  The pain in the lower portion of both feet, worse at night, has improved, alleviated by walking; he states this is being evaluated. He also has a history of lumbar spine surgery for sciatica. He found that his shoes were too small, larger sized shoes and his feet have improved. He sees a Restaurant manager, fast food every 6 weeks.   Patient denies New Medical or Surgical History. Pt denies claudication symptoms with walking.  Pt Diabetic: No  Pt smoker: former smoker, quit in 2007, using smokeless tobacco since 2011   Pt meds include:  Statin : No: states he cannot take statins due to severe myalgias, has tried 3-4 different statins , he started, subcut. Injection twice/month, Alirocumab in July, 2015 and LDL decreased from 152 to 70, per pt. He takes 5 mg Crestor once/week  Betablocker: No  ASA: Yes  Other anticoagulants/antiplatelets: none   Past Medical History  Diagnosis Date  . Carotid artery occlusion   . Arthritis   . Heart attack Oct. 2009    Mild  . Hyperlipidemia   . Coronary atherosclerosis of native coronary artery 01/29/2013    Social History History  Substance Use Topics  . Smoking status: Former Smoker    Types: Cigarettes    Quit date: 02/28/2005  . Smokeless tobacco: Current User  . Alcohol Use: 0.0 oz/week    1-2 Glasses of wine per week    Family History Family History  Problem Relation Age of Onset  . Heart attack Mother   . Coronary artery disease Mother   . Heart disease Mother     Carotid Stenosis and BPG and Heart Disease before  age 58  . Diabetes Mother   . Hypertension Mother   . Heart attack Father   . Heart disease Father     BPG and Heart Disease before age 5  . Cancer Father   . Hypertension Father     Surgical History Past Surgical History  Procedure Laterality Date  . Spine surgery    . Carotid endarterectomy  01/05/2006    Right  CEA with DPA    Allergies  Allergen Reactions  . Statins     Failed Crestor 5 mg twice weekly, Crestor 20 mg daily, Pravastatin 40 mg qd, Lipitor, Zocor - muscle aches  . Zetia [Ezetimibe]     Muscle aches    Current Outpatient Prescriptions  Medication Sig Dispense Refill  . Alirocumab (PRALUENT) 75 MG/ML SOPN Inject 75 mg into the skin every 14 (fourteen) days.      Marland Kitchen amLODipine (NORVASC) 5 MG tablet Take 5 mg by mouth.       Marland Kitchen aspirin 81 MG tablet Take 81 mg by mouth daily.      . cholecalciferol (VITAMIN D) 1000 UNITS tablet Take 1 tablet (1,000 Units total) by mouth daily.      . cyclobenzaprine (FLEXERIL) 10 MG tablet Take 1 tablet (10 mg total) by mouth 3 (three) times daily as needed for muscle spasms.  15 tablet  0  . fish oil-omega-3  fatty acids 1000 MG capsule Take 1 g by mouth daily.       Marland Kitchen gabapentin (NEURONTIN) 300 MG capsule Take 300 mg by mouth 2 (two) times daily.       . Glucosamine-Chondroit-Vit C-Mn (GLUCOSAMINE 1500 COMPLEX PO) Take 1 tablet by mouth daily.       Marland Kitchen lisinopril (PRINIVIL,ZESTRIL) 10 MG tablet TAKE 1 TABLET BY MOUTH ONCE DAILY.  90 tablet  2  . metoprolol succinate (TOPROL-XL) 25 MG 24 hr tablet TAKE 1 TABLET BY MOUTH ONCE DAILY.  90 tablet  1  . OVER THE COUNTER MEDICATION Take 30 mLs by mouth at bedtime as needed. Z Quil- for sleep      . Phenyleph-CPM-DM-APAP (ALKA-SELTZER PLUS COLD & FLU PO) Take 2 tablets by mouth every 6 (six) hours as needed. For cold symptoms      . Plant Sterols and Stanols 450 MG TABS Take 2 tablets twice daily prior to largest meals    0  . psyllium (HYDROCIL/METAMUCIL) 95 % PACK Take 1 packet by mouth  daily.      . rosuvastatin (CRESTOR) 10 MG tablet Take 5 mg by mouth once a week.       No current facility-administered medications for this visit.    Review of Systems : See HPI for pertinent positives and negatives.  Physical Examination  Filed Vitals:   11/20/13 1053 11/20/13 1055  BP: 141/96 143/91  Pulse: 54 57  Resp:  14  Height:  5\' 6"  (1.676 m)  Weight:  178 lb (80.74 kg)  SpO2:  97%   Body mass index is 28.74 kg/(m^2).  General: WDWN male in NAD  GAIT: normal  Eyes: PERRLA  Pulmonary: CTAB, Negative Rales, Negative rhonchi, & Negative wheezing.  Cardiac: regular Rhythm , Negative Murmur detected.   VASCULAR EXAM  Carotid Bruits  Left  Right    Negative  Negative    Radial pulses: are 2+ palpable and = Aorta: is not palpable  LE Pulses  LEFT  RIGHT   POPLITEAL  not palpable  not palpable   POSTERIOR TIBIAL  palpable  palpable   DORSALIS PEDIS  ANTERIOR TIBIAL  not palpable  palpable    Gastrointestinal: soft, nontender, BS WNL, no r/g, no masses palpated.  Musculoskeletal: Negative muscle atrophy/wasting. M/S 5/5 throughout, Extremities without ischemic changes.  Neurologic: A&O X 3; Appropriate Affect ; SENSATION ;normal;  Speech is normal  CN 2-12 intact, Pain and light touch intact in extremities, Motor exam as listed above.    Non-Invasive Vascular Imaging CAROTID DUPLEX 11/20/2013   CEREBROVASCULAR DUPLEX EVALUATION    INDICATION: Carotid endarterectomy     PREVIOUS INTERVENTION(S): Right carotid endarterectomy on 01/05/06    DUPLEX EXAM:     RIGHT  LEFT  Peak Systolic Velocities (cm/s) End Diastolic Velocities (cm/s) Plaque LOCATION Peak Systolic Velocities (cm/s) End Diastolic Velocities (cm/s) Plaque  60 15  CCA PROXIMAL 64 15   61 19 HT CCA MID 67 21 HT  48 16 HT CCA DISTAL 62 21 HT  57 13  ECA 150 30 HT  44 13  ICA PROXIMAL 145 57 CP  60 24  ICA MID 98 29   59 24  ICA DISTAL 78 23     Not Calculated ICA / CCA Ratio (PSV) 2.34   Antegrade Vertebral Flow Antegrade  500 Brachial Systolic Pressure (mmHg) 938  Multiphasic (subclavian artery) Brachial Artery Waveforms Multiphasic (subclavian artery)    Plaque Morphology:  HM = Homogeneous, HT =  Heterogeneous, CP = Calcific Plaque, SP = Smooth Plaque, IP = Irregular Plaque     ADDITIONAL FINDINGS: No significant stenosis of the bilateral external or common carotid arteries.    IMPRESSION: 1. Patent right carotid endarterectomy site with no right internal carotid artery stenosis. 2. Doppler velocities suggest a 40-59% stenosis of the left proximal internal carotid artery.    Compared to the previous exam:  No significant change noted when compared to the previous exam on 11/13/12.      Assessment: [name] is a 63 y.o. male who is s/p right CEA with Dacron patch in 2007.  He had 2 episodes of amaurosis fugax on one eye, he cannot remember which eye, before he had the CEA and has had no further episodes.  Carotid Duplex today reveals a patent right carotid endarterectomy site with no right internal carotid artery stenosis and 40-59% stenosis of the left proximal internal carotid artery. No significant change noted when compared to the previous exam on 11/13/12.  Plan: Follow-up in 1 year with Carotid Duplex scan.  He was counseled against the use of smokeless tobacco.  I discussed in depth with the patient the nature of atherosclerosis, and emphasized the importance of maximal medical management including strict control of blood pressure, blood glucose, and lipid levels, obtaining regular exercise, and cessation of smokeless tobacco.  The patient is aware that without maximal medical management the underlying atherosclerotic disease process will progress, limiting the benefit of any interventions. The patient was given information about stroke prevention and what symptoms should prompt the patient to seek immediate medical care. Thank you for allowing [place] to  participate in this patient's care.  Clemon Chambers, RN, MSN, FNP-C [facility] Office: [phone]  Clinic Physician: Scot Dock  11/20/2013 9:51 AM

## 2013-12-10 ENCOUNTER — Other Ambulatory Visit: Payer: BC Managed Care – PPO

## 2013-12-12 ENCOUNTER — Ambulatory Visit: Payer: BC Managed Care – PPO | Admitting: Pharmacist

## 2013-12-19 ENCOUNTER — Other Ambulatory Visit: Payer: Self-pay | Admitting: Family Medicine

## 2013-12-19 ENCOUNTER — Other Ambulatory Visit: Payer: Self-pay | Admitting: Pediatrics

## 2013-12-23 ENCOUNTER — Other Ambulatory Visit: Payer: Self-pay | Admitting: Family Medicine

## 2013-12-23 DIAGNOSIS — F17200 Nicotine dependence, unspecified, uncomplicated: Secondary | ICD-10-CM

## 2013-12-30 ENCOUNTER — Ambulatory Visit
Admission: RE | Admit: 2013-12-30 | Discharge: 2013-12-30 | Disposition: A | Discharge status: Home / Self Care | Payer: No Typology Code available for payment source | Source: Ambulatory Visit | Attending: Family Medicine | Admitting: Family Medicine

## 2013-12-30 DIAGNOSIS — F17200 Nicotine dependence, unspecified, uncomplicated: Secondary | ICD-10-CM

## 2014-01-27 ENCOUNTER — Other Ambulatory Visit (INDEPENDENT_AMBULATORY_CARE_PROVIDER_SITE_OTHER): Payer: BC Managed Care – PPO | Admitting: *Deleted

## 2014-01-27 DIAGNOSIS — E785 Hyperlipidemia, unspecified: Secondary | ICD-10-CM

## 2014-01-27 DIAGNOSIS — Z79899 Other long term (current) drug therapy: Secondary | ICD-10-CM

## 2014-01-27 LAB — LIPID PANEL
Cholesterol: 163 mg/dL (ref 0–200)
HDL: 52.2 mg/dL (ref >39.00)
LDL Cholesterol: 92 mg/dL (ref 0–99)
NonHDL: 110.8
TRIGLYCERIDES: 96 mg/dL (ref 0.0–149.0)
Total CHOL/HDL Ratio: 3
VLDL: 19.2 mg/dL (ref 0.0–40.0)

## 2014-01-27 LAB — HEPATIC FUNCTION PANEL
ALBUMIN: 4.2 g/dL (ref 3.5–5.2)
ALK PHOS: 73 U/L (ref 39–117)
ALT: 33 U/L (ref 0–53)
AST: 26 U/L (ref 0–37)
Bilirubin, Direct: 0.1 mg/dL (ref 0.0–0.3)
TOTAL PROTEIN: 7.3 g/dL (ref 6.0–8.3)
Total Bilirubin: 0.8 mg/dL (ref 0.2–1.2)

## 2014-01-29 ENCOUNTER — Ambulatory Visit (INDEPENDENT_AMBULATORY_CARE_PROVIDER_SITE_OTHER): Payer: BC Managed Care – PPO | Admitting: Pharmacist

## 2014-01-29 ENCOUNTER — Ambulatory Visit (INDEPENDENT_AMBULATORY_CARE_PROVIDER_SITE_OTHER): Payer: BC Managed Care – PPO | Admitting: Cardiology

## 2014-01-29 ENCOUNTER — Encounter: Payer: Self-pay | Admitting: Cardiology

## 2014-01-29 VITALS — BP 170/90 | HR 64 | Ht 66.0 in | Wt 179.4 lb

## 2014-01-29 DIAGNOSIS — I2510 Atherosclerotic heart disease of native coronary artery without angina pectoris: Secondary | ICD-10-CM

## 2014-01-29 DIAGNOSIS — E785 Hyperlipidemia, unspecified: Secondary | ICD-10-CM

## 2014-01-29 DIAGNOSIS — Z889 Allergy status to unspecified drugs, medicaments and biological substances status: Secondary | ICD-10-CM

## 2014-01-29 DIAGNOSIS — I252 Old myocardial infarction: Secondary | ICD-10-CM

## 2014-01-29 DIAGNOSIS — Z789 Other specified health status: Secondary | ICD-10-CM

## 2014-01-29 DIAGNOSIS — I6529 Occlusion and stenosis of unspecified carotid artery: Secondary | ICD-10-CM

## 2014-01-29 MED ORDER — ALIROCUMAB 150 MG/ML ~~LOC~~ SOPN: 150.0000 mg | PEN_INJECTOR | SUBCUTANEOUS | Status: DC

## 2014-01-29 NOTE — Progress Notes (Signed)
Patient is a pleasant 63 y.o. WM referred to me by Dr. Marlou Porch given carotid disease and non-obstructive coronary disease and inability to tolerate daily statins, who is here for a f/u visit for his hyperlipidemia.  Patient was taking Crestor 5 mg three times per week earlier this year and wasn't able to tolerate higher doses.  Patient was started on Praluent 75 mg twice monthly on 10/22/13 as well given LDL 150 mg/dL despite Crestor use.  His muscle aches worsened over past month which were similar to his previous statin myalgia, so he reduced his Crestor down to twice weekly, then ultimately down to once weekly.  He doesn't get muscle aches on once weekly Crestor 5 mg, so this appears Crestor once weekly is his highest tolerated dose of statin.    His LDL has dropped from 150 mg/dL down to 78 mg/dL after Praluent 75 mg q 2 weeks was started.  It has now increased to 92 mg/dL.  Pt states he is tolerating Praluent with no issues.  He has been able to get his shipment from [facility] with no problem.  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    Risk factors:  Non-obstructive CAD, h/o Carotid endarectomy, HTN, age - LDL goal < 70 mg/dL preferably, non-HDL goal < 100 Meds: Crestor 5 mg once weekly, Fish oil, Praluent 75 mg q 2 weeks. Intolerant:  Crestor 5 mg twice weekly, Crestor 5 mg tiw, Crestor 20 mg qd, Pravastatin 40 mg qd, Lipitor, Zocor, Zetia - muscle aches.  Social history - drinks 1-2 glasses of wine daily.  Occasionally drinks beer.  No tobacco currently.   Family history - Father died ~ 60 y.o. From CVA.  Mother had heart disease as well.  Diet:  Breakfast - Much improved.  Was eating bacon/eggs daily, and now only eating this once weekly.  Eating cereal and fruit other days of the week.  Lunch - Salad.  Dinner - Fish or chicken and a vegetable.  Drinks very little soda, and rarely eats desserts. Exercise - Walks 3 miles every day,  and plays golf 5 days per week.    Labs:  12/2013: TC 163, TG 96, HDL 52, LDL 92 non-HDL, 111, LFTs normal (Crestor 5mg  weekly and Praluent 75mg  every 2 weeks) 10/2013:  TC 152, TG 109, HDL 52, LDL 78, non-HDL 100, LFTs normal (Crestor 5 mg qweek, Praluent 75 mg q 2 weeks) 07/2013:  TC 242, TG 88, HDL 75, LDL 150, non-HDL 167, LFTs normal, Vitamin D 83 (Crestor 5 mg tiw) 04/2013 - TC 241, TG 132, HDL 52, LDL 162, non-HDL 189 (Crestor 5 mg once weekly) 04/2013 (PCP) - CRP was 5.74 03/2012 - TC 260, TG 137, HDL 57, LDL 160, non-HDL 203 (not on meds; baseline LDL b/t 160-200 mg/dL historically) No evidence of a vitamin D level.  If low, this could contribute to his inability to tolerate daily statins.  Current Outpatient Prescriptions  Medication Sig Dispense Refill  . Alirocumab (PRALUENT) 75 MG/ML SOPN Inject 75 mg into the skin every 14 (fourteen) days.    Marland Kitchen amLODipine (NORVASC) 5 MG tablet Take 5 mg by mouth.     Marland Kitchen aspirin 81 MG tablet Take 81 mg by mouth daily.    . cholecalciferol (VITAMIN D) 1000 UNITS tablet Take 1 tablet (1,000 Units total) by mouth daily.    . cyclobenzaprine (FLEXERIL) 10 MG tablet Take 1 tablet (10 mg  total) by mouth 3 (three) times daily as needed for muscle spasms. 15 tablet 0  . fish oil-omega-3 fatty acids 1000 MG capsule Take 1 g by mouth daily.     Marland Kitchen gabapentin (NEURONTIN) 300 MG capsule Take 300 mg by mouth 2 (two) times daily.     . Glucosamine-Chondroit-Vit C-Mn (GLUCOSAMINE 1500 COMPLEX PO) Take 1 tablet by mouth daily.     Marland Kitchen lisinopril (PRINIVIL,ZESTRIL) 10 MG tablet TAKE 1 TABLET BY MOUTH ONCE DAILY. 90 tablet 2  . meloxicam (MOBIC) 7.5 MG tablet as needed.    . metoprolol succinate (TOPROL-XL) 25 MG 24 hr tablet TAKE 1 TABLET BY MOUTH ONCE DAILY. 90 tablet 1  . OVER THE COUNTER MEDICATION Take 30 mLs by mouth at bedtime as needed. Z Quil- for sleep    . Phenyleph-CPM-DM-APAP (ALKA-SELTZER PLUS COLD & FLU PO) Take 2 tablets by mouth every 6 (six) hours as  needed. For cold symptoms    . Plant Sterols and Stanols 450 MG TABS Take 2 tablets twice daily prior to largest meals (Patient not taking: Reported on 01/29/2014)  0  . psyllium (HYDROCIL/METAMUCIL) 95 % PACK Take 1 packet by mouth daily.    . traMADol (ULTRAM) 50 MG tablet as needed.     No current facility-administered medications for this visit.   Allergies  Allergen Reactions  . Statins     Failed Crestor 5 mg twice weekly, Crestor 20 mg daily, Pravastatin 40 mg qd, Lipitor, Zocor - muscle aches  . Zetia [Ezetimibe]     Muscle aches   Family History  Problem Relation Age of Onset  . Heart attack Mother   . Coronary artery disease Mother   . Heart disease Mother     Carotid Stenosis and BPG and Heart Disease before age 31  . Diabetes Mother   . Hypertension Mother   . Heart attack Father   . Heart disease Father     BPG and Heart Disease before age 28  . Cancer Father   . Hypertension Father

## 2014-01-29 NOTE — Assessment & Plan Note (Addendum)
Pt's LDL increased slightly since last check.  This is not unusual with Praluent to see a slight increase after the initial decrease.  Will increase his dose to 150mg  every 2 weeks to ensure 50% reduction.  Pt has 1 injection of the 75mg  left that he will take before increasing to the 150mg  syringe.  New Rx has been called to [facility].

## 2014-01-29 NOTE — Patient Instructions (Addendum)
The current medical regimen is effective;  continue present plan and medications.  Follow up with [facility] Clinic and have fasting blood work in 3 months.  Follow up in 1 year with Dr. Marlou Porch.  You will receive a letter in the mail 2 months before you are due.  Please call us when you receive this letter to schedule your follow up appointment.

## 2014-01-29 NOTE — Progress Notes (Signed)
[place]. [address], [address] [place], [place]  [address] Phone: [phone] Fax:  [phone]  Date:  01/29/2014   ID:  [name], DOB 1950/05/25, MRN [id number]  PCP:   Melinda Crutch, MD   History of Present Illness: [name] is a 63 y.o. male with prior cardiac catheterization in October of 2009 following mildly positive troponin (isolated blood draw), mild non-ST elevation MI which revealed 60% right coronary artery stenosis. A nuclear stress test placed following this to determine if the lesion was flow limiting and stress test overall was reassuring. He exercised for 9 minutes.  Previously, had right carotid endarterectomy, amaurosis fugax. He was upset about the increase in cost of vascular ultrasound.  He is had issues in the past tolerating statin therapy, Crestor included. He is currently taking Pralulent. 50% reduction in LDL.  Overall he is not experiencing any anginal symptoms, no syncope, no palpitations. Doing well. He still enjoys playing golf quite frequently.   Wt Readings from Last 3 Encounters:  01/29/14 179 lb 6.4 oz (81.375 kg)  11/20/13 178 lb (80.74 kg)  11/07/13 177 lb (80.287 kg)     Past Medical History  Diagnosis Date  . Carotid artery occlusion   . Arthritis   . Heart attack Oct. 2009    Mild  . Hyperlipidemia   . Coronary atherosclerosis of native coronary artery 01/29/2013    Past Surgical History  Procedure Laterality Date  . Spine surgery    . Carotid endarterectomy  01/05/2006    Right  CEA with DPA    Current Outpatient Prescriptions  Medication Sig Dispense Refill  . Alirocumab (PRALUENT) 75 MG/ML SOPN Inject 75 mg into the skin every 14 (fourteen) days.    Marland Kitchen amLODipine (NORVASC) 5 MG tablet Take 5 mg by mouth.     Marland Kitchen aspirin 81 MG tablet Take 81 mg by mouth daily.    . cholecalciferol (VITAMIN D) 1000 UNITS tablet Take 1 tablet (1,000 Units total) by mouth daily.    . cyclobenzaprine (FLEXERIL) 10 MG tablet Take 1 tablet  (10 mg total) by mouth 3 (three) times daily as needed for muscle spasms. 15 tablet 0  . fish oil-omega-3 fatty acids 1000 MG capsule Take 1 g by mouth daily.     . Glucosamine-Chondroit-Vit C-Mn (GLUCOSAMINE 1500 COMPLEX PO) Take 1 tablet by mouth daily.     Marland Kitchen lisinopril (PRINIVIL,ZESTRIL) 10 MG tablet TAKE 1 TABLET BY MOUTH ONCE DAILY. 90 tablet 2  . meloxicam (MOBIC) 7.5 MG tablet as needed.    . metoprolol succinate (TOPROL-XL) 25 MG 24 hr tablet TAKE 1 TABLET BY MOUTH ONCE DAILY. 90 tablet 1  . OVER THE COUNTER MEDICATION Take 30 mLs by mouth at bedtime as needed. Z Quil- for sleep    . Phenyleph-CPM-DM-APAP (ALKA-SELTZER PLUS COLD & FLU PO) Take 2 tablets by mouth every 6 (six) hours as needed. For cold symptoms    . psyllium (HYDROCIL/METAMUCIL) 95 % PACK Take 1 packet by mouth daily.    . rosuvastatin (CRESTOR) 10 MG tablet Take 5 mg by mouth once a week.    . traMADol (ULTRAM) 50 MG tablet as needed.    . gabapentin (NEURONTIN) 300 MG capsule Take 300 mg by mouth 2 (two) times daily.     . Plant Sterols and Stanols 450 MG TABS Take 2 tablets twice daily prior to largest meals (Patient not taking: Reported on 01/29/2014)  0   No current  facility-administered medications for this visit.    Allergies:    Allergies  Allergen Reactions  . Statins     Failed Crestor 5 mg twice weekly, Crestor 20 mg daily, Pravastatin 40 mg qd, Lipitor, Zocor - muscle aches  . Zetia [Ezetimibe]     Muscle aches    Social History:  The patient  reports that he quit smoking about 8 years ago. His smoking use included Cigarettes. He smoked 0.00 packs per day. His smokeless tobacco use includes Snuff. He reports that he drinks alcohol. He reports that he does not use illicit drugs.   ROS:  Please see the history of present illness.   Denies any fevers, chills, syncope, orthopnea, PND  PHYSICAL EXAM: VS:  BP 170/90 mmHg  Pulse 64  Ht 5\' 6"  (1.676 m)  Wt 179 lb 6.4 oz (81.375 kg)  BMI 28.97 kg/m2 Well  nourished, well developed, in no acute distress HEENT: normal Neck: no JVD right carotid endarterectomy scar noted Cardiac:  normal S1, S2; RRR; no murmur Lungs:  clear to auscultation bilaterally, no wheezing, rhonchi or rales Abd: soft, nontender, no hepatomegaly Ext: no edema Skin: warm and dry Neuro: no focal abnormalities noted  EKG:  01/29/14-normal sinus rhythm, 64, nonspecific T-wave changes, flattening. Prior 01/29/13: Sinus rhythm rate 68 with nonspecific T-wave changes.     ASSESSMENT AND PLAN:  1. Coronary artery disease-previously described as nonobstructive 60% RCA lesion with reassuring nuclear stress test showing no evidence of ischemia in that territory. Continuing with secondary prevention efforts. Unable to tolerate statin therapy. We have had lengthy discussions about this in the past. 2. Peripheral vascular disease/carotid artery disease-prior right sided carotid endarterectomy. 3. Hypertension-multidrug regimen. Elevated today, previously well controlled. Continuing to work with both me and Dr. Harrington Challenger. Continue current medications 4. Hyperlipidemia-unable to tolerate statins as above. Taking Praulent. Elberta Leatherwood, Pharm.D. with lipid clinic currently discussing with him. 50% reduction in LDL.  Signed, Candee Furbish, MD [facility]  01/29/2014 11:13 AM

## 2014-01-29 NOTE — Patient Instructions (Signed)
Increase Praluent to 150mg  every 2 weeks.  Follow up in 3 months

## 2014-02-03 ENCOUNTER — Other Ambulatory Visit: Payer: Self-pay | Admitting: Cardiology

## 2014-03-26 ENCOUNTER — Telehealth: Payer: Self-pay | Admitting: Pharmacist

## 2014-03-26 NOTE — Telephone Encounter (Signed)
New MEssage  Pt wanted to speak to [facility]. Please call back and discuss.

## 2014-03-27 NOTE — Telephone Encounter (Signed)
Spoke with pt.  He was confirming that we received his new insurance information.  I did receive the information and will start paperwork for Praluent approval again.  He is aware this may take 6-8 weeks.  He has enough injections for the next 6 weeks.

## 2014-04-01 ENCOUNTER — Other Ambulatory Visit: Payer: Self-pay | Admitting: Cardiology

## 2014-04-01 ENCOUNTER — Telehealth: Payer: Self-pay | Admitting: Cardiology

## 2014-04-01 NOTE — Telephone Encounter (Signed)
New Msg        Beverlee Nims calling from [employer] calling about Prior auth for medication Praluent 150mg .  Pt is waiting for meds and Beverlee Nims requests call [phone].

## 2014-04-08 NOTE — Telephone Encounter (Signed)
Attempted to call number listed.  They are the pharmacy and not able to do PA.  Not sure why that number was given.  Will have to follow up with [employer].

## 2014-04-08 NOTE — Telephone Encounter (Signed)
Follow up     Calling for prior authorization for the praluent.  Please call today if possible.

## 2014-04-10 NOTE — Telephone Encounter (Signed)
New PA sent to [facility]

## 2014-04-24 ENCOUNTER — Telehealth: Payer: Self-pay | Admitting: Pharmacist

## 2014-04-24 NOTE — Telephone Encounter (Signed)
New message      Ins policy changed-----praluent is under review.  Pt took last shot.  Wife thinks ins will no longer pay---do you know anything about this?

## 2014-04-28 ENCOUNTER — Telehealth: Payer: Self-pay | Admitting: *Deleted

## 2014-04-28 MED ORDER — ALIROCUMAB 150 MG/ML ~~LOC~~ SOPN: 150.0000 mg | PEN_INJECTOR | SUBCUTANEOUS | Status: DC

## 2014-04-28 NOTE — Telephone Encounter (Signed)
Spoke with pt's wife.  Pt's insurance changed so we had to do another PA.  That has been approved and he should be getting his next shipment of drug in the next few days.

## 2014-04-28 NOTE — Telephone Encounter (Signed)
Spoke with pt's wife.  New Rx sent to specialty pharmacy.

## 2014-04-28 NOTE — Telephone Encounter (Signed)
Patients wife states that per [facility] [employer] the patients rx for praluent needs to be extended in order for him to continue receiving this. Thanks, MI

## 2014-05-14 ENCOUNTER — Other Ambulatory Visit (INDEPENDENT_AMBULATORY_CARE_PROVIDER_SITE_OTHER): Payer: BLUE CROSS/BLUE SHIELD | Admitting: *Deleted

## 2014-05-14 DIAGNOSIS — Z79899 Other long term (current) drug therapy: Secondary | ICD-10-CM

## 2014-05-14 DIAGNOSIS — E785 Hyperlipidemia, unspecified: Secondary | ICD-10-CM

## 2014-05-14 LAB — HEPATIC FUNCTION PANEL
ALBUMIN: 4.5 g/dL (ref 3.5–5.2)
ALK PHOS: 72 U/L (ref 39–117)
ALT: 37 U/L (ref 0–53)
AST: 29 U/L (ref 0–37)
BILIRUBIN DIRECT: 0.1 mg/dL (ref 0.0–0.3)
Total Bilirubin: 0.6 mg/dL (ref 0.2–1.2)
Total Protein: 7.4 g/dL (ref 6.0–8.3)

## 2014-05-14 LAB — LIPID PANEL
Cholesterol: 136 mg/dL (ref 0–200)
HDL: 58.5 mg/dL (ref >39.00)
LDL Cholesterol: 51 mg/dL (ref 0–99)
NONHDL: 77.5
Total CHOL/HDL Ratio: 2
Triglycerides: 134 mg/dL (ref 0.0–149.0)
VLDL: 26.8 mg/dL (ref 0.0–40.0)

## 2014-05-14 NOTE — Addendum Note (Signed)
Addended by: Eulis Foster on: 05/14/2014 07:42 AM   Modules accepted: Orders

## 2014-05-15 ENCOUNTER — Telehealth: Payer: Self-pay | Admitting: Cardiology

## 2014-05-15 NOTE — Telephone Encounter (Signed)
**Note De-Identified  Obfuscation** The pt is advised of his lab results and he verbalized understanding.

## 2014-05-15 NOTE — Telephone Encounter (Signed)
New message ° ° ° ° ° ° °Pt returning nurse call  °

## 2014-05-20 ENCOUNTER — Ambulatory Visit (INDEPENDENT_AMBULATORY_CARE_PROVIDER_SITE_OTHER): Payer: BLUE CROSS/BLUE SHIELD | Admitting: Pharmacist

## 2014-05-20 DIAGNOSIS — E785 Hyperlipidemia, unspecified: Secondary | ICD-10-CM | POA: Diagnosis not present

## 2014-05-20 NOTE — Assessment & Plan Note (Signed)
Patient's LDL decreased to 51 after increasing his Praluent from 75mg  to 150mg  twice monthly. Patient reports tolerating his Praluent well. Will follow up in 6 months for lipid panel (anticipated September 2016).

## 2014-05-20 NOTE — Progress Notes (Signed)
Patient is a pleasant 64 y.o. WM originally referred to lipid clinic by Dr. Marlou Porch. Patient presents to lipid clinic with his wife for follow-up. He still reports taking Crestor 5mg  once weekly which is the highest statin dose he can tolerate without developing myalgias. He was started on Praluent 75mg  twice monthly on 10/22/13 when LDL was 150 despite Crestor use. LDL dropped to 78 after Praluent was started. However, in December 2015, LDL increased to 92. This is not unusual with Praluent to see a slight increase after the initial decrease in LDL. Praluent was increased to 150mg  twice monthly on 01/29/14. Follow up lipid panel in March 2016 showed marked reduction in LDL down to 51.   Patient has been tolerating Praluent with no issues. He states that he will qualify for Medicare this November and will start the application process 3 months beforehand. Anticipate that PA will be required at this time as patient changes insurance plans. Informed him to call clinic once he receives his new insurance information so that we can start the necessary paperwork.  Risk factors: Non-obstructive CAD, h/o Carotid endarectomy, HTN, age - LDL goal < 70 mg/dL preferably, non-HDL goal < 100 Meds: Crestor 5 mg once weekly, Fish oil, Praluent 150 mg q 2 weeks. Intolerant: Crestor 5 mg twice weekly, Crestor 5 mg tiw, Crestor 20 mg qd, Pravastatin 40 mg qd, Lipitor, Zocor, Zetia - muscle aches.  Social history - drinks 1-2 glasses of wine daily. Occasionally drinks beer. No tobacco currently.  Family history - Father died ~ 54 y.o. From CVA. Mother had heart disease as well.  Diet: Breakfast - Much improved. Was eating bacon/eggs daily, and now only eating this once weekly. Eating cereal and fruit other days of the week. Lunch - Salad. Dinner - Fish or chicken and a vegetable. Drinks very little soda, and rarely eats desserts. Exercise - Walks 2 miles every day, and plays golf 5 days per week.   Labs:   04/2014:  TC 136, TG 134, HDL 59, LDL 51, non-HDL 77, LFTs normal (Crestor 5mg  weekly and Praluent 150mg  every 2 weeks) 12/2013: TC 163, TG 96, HDL 52, LDL 92 non-HDL 111, LFTs normal (Crestor 5mg  weekly and Praluent 75mg  every 2 weeks) 10/2013: TC 152, TG 109, HDL 52, LDL 78, non-HDL 100, LFTs normal (Crestor 5 mg qweek, Praluent 75 mg q 2 weeks) 07/2013: TC 242, TG 88, HDL 75, LDL 150, non-HDL 167, LFTs normal, Vitamin D 83 (Crestor 5 mg tiw) 04/2013 - TC 241, TG 132, HDL 52, LDL 162, non-HDL 189 (Crestor 5 mg once weekly) 04/2013 (PCP) - CRP was 5.74 03/2012 - TC 260, TG 137, HDL 57, LDL 160, non-HDL 203 (not on meds; baseline LDL b/t 160-200 mg/dL historically)  Current Outpatient Prescriptions on File Prior to Visit  Medication Sig Dispense Refill  . Alirocumab (PRALUENT) 150 MG/ML SOPN Inject 150 mg into the skin every 14 (fourteen) days. 6 pen 3  . amLODipine (NORVASC) 5 MG tablet Take 5 mg by mouth.     Marland Kitchen aspirin 81 MG tablet Take 81 mg by mouth daily.    . cholecalciferol (VITAMIN D) 1000 UNITS tablet Take 1 tablet (1,000 Units total) by mouth daily.    . cyclobenzaprine (FLEXERIL) 10 MG tablet Take 1 tablet (10 mg total) by mouth 3 (three) times daily as needed for muscle spasms. 15 tablet 0  . fish oil-omega-3 fatty acids 1000 MG capsule Take 1 g by mouth daily.     Marland Kitchen gabapentin (  NEURONTIN) 300 MG capsule Take 300 mg by mouth 2 (two) times daily.     . Glucosamine-Chondroit-Vit C-Mn (GLUCOSAMINE 1500 COMPLEX PO) Take 1 tablet by mouth daily.     Marland Kitchen lisinopril (PRINIVIL,ZESTRIL) 10 MG tablet TAKE 1 TABLET BY MOUTH ONCE DAILY. 90 tablet 2  . meloxicam (MOBIC) 7.5 MG tablet as needed.    . metoprolol succinate (TOPROL-XL) 25 MG 24 hr tablet TAKE 1 TABLET BY MOUTH ONCE DAILY. 90 tablet 3  . OVER THE COUNTER MEDICATION Take 30 mLs by mouth at bedtime as needed. Z Quil- for sleep    . Phenyleph-CPM-DM-APAP (ALKA-SELTZER PLUS COLD & FLU PO) Take 2 tablets by mouth every 6 (six) hours as  needed. For cold symptoms    . psyllium (HYDROCIL/METAMUCIL) 95 % PACK Take 1 packet by mouth daily.    . traMADol (ULTRAM) 50 MG tablet as needed.     No current facility-administered medications on file prior to visit.   Allergies  Allergen Reactions  . Statins     Failed Crestor 5 mg twice weekly, Crestor 20 mg daily, Pravastatin 40 mg qd, Lipitor, Zocor - muscle aches  . Zetia [Ezetimibe]     Muscle aches

## 2014-06-05 ENCOUNTER — Telehealth: Payer: Self-pay | Admitting: Pharmacist Clinician (PhC)/ Clinical Pharmacy Specialist

## 2014-06-05 NOTE — Telephone Encounter (Signed)
Pt out of Praluent 150 mg, states received phone call from [place] telling him could not refill until received information they faxed to [place].  We did receive fax stating that they are in the process of verifying benefits and confirming delivery.  Will give patient sample today, as he was due to take almost 1 week ago.

## 2014-06-16 ENCOUNTER — Telehealth: Payer: Self-pay

## 2014-06-16 NOTE — Telephone Encounter (Signed)
Per pt praleunt prescription prior authorization was denied. Would like call back to get help with an appeal. Please call back [phone]

## 2014-06-18 NOTE — Telephone Encounter (Signed)
LMOM for pt.  We received the denial letter as well.  They state they are denying because LDL is <100 but he has not achieved a 45% reduction in LDL.  I am noto quite sure of this reasoning given they have approved it on 2 other occassions.  One option may be to recheck pt's labs and see if there has been enough improvement to meet the 45% reduction requirement.  Will discuss options with patient when he returns call.

## 2014-06-19 ENCOUNTER — Other Ambulatory Visit (INDEPENDENT_AMBULATORY_CARE_PROVIDER_SITE_OTHER): Payer: BLUE CROSS/BLUE SHIELD | Admitting: *Deleted

## 2014-06-19 DIAGNOSIS — E785 Hyperlipidemia, unspecified: Secondary | ICD-10-CM

## 2014-06-19 LAB — LIPID PANEL
Cholesterol: 131 mg/dL (ref 0–200)
HDL: 56.4 mg/dL (ref >39.00)
LDL CALC: 59 mg/dL (ref 0–99)
NonHDL: 74.6
Total CHOL/HDL Ratio: 2
Triglycerides: 80 mg/dL (ref 0.0–149.0)
VLDL: 16 mg/dL (ref 0.0–40.0)

## 2014-11-20 ENCOUNTER — Other Ambulatory Visit: Payer: BLUE CROSS/BLUE SHIELD

## 2014-12-09 ENCOUNTER — Other Ambulatory Visit (HOSPITAL_COMMUNITY): Payer: BC Managed Care – PPO

## 2014-12-09 ENCOUNTER — Ambulatory Visit: Payer: BC Managed Care – PPO | Admitting: Family

## 2014-12-30 ENCOUNTER — Telehealth: Payer: Self-pay | Admitting: Cardiology

## 2014-12-30 NOTE — Telephone Encounter (Signed)
Lipid and Liver profiles ordered by Elberta Leatherwood, PharmD.  Orders linked to 01/30/15 appt

## 2014-12-30 NOTE — Telephone Encounter (Signed)
New message  Pt scheduled appt for December for follow up. Please put in orders for labs per pt Req. Lab scheduled for 01/30/2015.

## 2015-01-30 ENCOUNTER — Other Ambulatory Visit (INDEPENDENT_AMBULATORY_CARE_PROVIDER_SITE_OTHER): Payer: PPO | Admitting: *Deleted

## 2015-01-30 DIAGNOSIS — I2510 Atherosclerotic heart disease of native coronary artery without angina pectoris: Secondary | ICD-10-CM

## 2015-01-30 DIAGNOSIS — E785 Hyperlipidemia, unspecified: Secondary | ICD-10-CM | POA: Diagnosis not present

## 2015-01-30 LAB — LIPID PANEL
CHOLESTEROL: 178 mg/dL (ref 125–200)
HDL: 51 mg/dL (ref >=40)
LDL Cholesterol: 108 mg/dL (ref <130)
Total CHOL/HDL Ratio: 3.5 Ratio (ref <=5.0)
Triglycerides: 96 mg/dL (ref <150)
VLDL: 19 mg/dL (ref <30)

## 2015-01-30 LAB — HEPATIC FUNCTION PANEL
ALK PHOS: 76 U/L (ref 40–115)
ALT: 33 U/L (ref 9–46)
AST: 26 U/L (ref 10–35)
Albumin: 4 g/dL (ref 3.6–5.1)
BILIRUBIN DIRECT: 0.2 mg/dL (ref <=0.2)
BILIRUBIN TOTAL: 0.6 mg/dL (ref 0.2–1.2)
Indirect Bilirubin: 0.4 mg/dL (ref 0.2–1.2)
Total Protein: 7.5 g/dL (ref 6.1–8.1)

## 2015-01-30 NOTE — Addendum Note (Signed)
Addended by: Eulis Foster on: 01/30/2015 07:36 AM   Modules accepted: Orders

## 2015-02-02 ENCOUNTER — Ambulatory Visit: Payer: BLUE CROSS/BLUE SHIELD | Admitting: Cardiology

## 2015-02-02 ENCOUNTER — Other Ambulatory Visit: Payer: BLUE CROSS/BLUE SHIELD

## 2015-02-03 ENCOUNTER — Encounter: Payer: Self-pay | Admitting: Cardiology

## 2015-02-03 ENCOUNTER — Ambulatory Visit (INDEPENDENT_AMBULATORY_CARE_PROVIDER_SITE_OTHER): Payer: PPO | Admitting: Cardiology

## 2015-02-03 VITALS — BP 130/76 | HR 63 | Ht 66.0 in | Wt 183.4 lb

## 2015-02-03 DIAGNOSIS — E785 Hyperlipidemia, unspecified: Secondary | ICD-10-CM | POA: Diagnosis not present

## 2015-02-03 DIAGNOSIS — I252 Old myocardial infarction: Secondary | ICD-10-CM

## 2015-02-03 DIAGNOSIS — I2510 Atherosclerotic heart disease of native coronary artery without angina pectoris: Secondary | ICD-10-CM

## 2015-02-03 DIAGNOSIS — I6529 Occlusion and stenosis of unspecified carotid artery: Secondary | ICD-10-CM | POA: Diagnosis not present

## 2015-02-03 DIAGNOSIS — I739 Peripheral vascular disease, unspecified: Secondary | ICD-10-CM

## 2015-02-03 NOTE — Progress Notes (Signed)
[name]. [address] [place], [place]  [address] Phone: [phone] Fax:  [phone]  Date:  02/03/2015   ID:  [name], DOB 11/06/1950, MRN [id number]  PCP:   Melinda Crutch, MD   History of Present Illness: [name] is a 64 y.o. male with prior cardiac catheterization in October of 2009 following mildly positive troponin (isolated blood draw), mild non-ST elevation MI which revealed 60% right coronary artery stenosis. A nuclear stress test placed following this to determine if the lesion was flow limiting and stress test overall was reassuring. He exercised for 9 minutes.  Previously, had right carotid endarterectomy, amaurosis fugax. He was upset about the increase in cost of vascular ultrasound.  He is had issues in the past tolerating statin therapy, Crestor included. He is currently taking Pralulent. 50% reduction in LDL.  Overall he is not experiencing any anginal symptoms, no syncope, no palpitations. Doing well. He still enjoys playing golf quite frequently. No change.   Wt Readings from Last 3 Encounters:  02/03/15 183 lb 6.4 oz (83.19 kg)  01/29/14 179 lb 6.4 oz (81.375 kg)  11/20/13 178 lb (80.74 kg)     Past Medical History  Diagnosis Date  . Carotid artery occlusion   . Arthritis   . Heart attack [facility]) Oct. 2009    Mild  . Hyperlipidemia   . Coronary atherosclerosis of native coronary artery 01/29/2013    Past Surgical History  Procedure Laterality Date  . Spine surgery    . Carotid endarterectomy  01/05/2006    Right  CEA with DPA    Current Outpatient Prescriptions  Medication Sig Dispense Refill  . Alirocumab (PRALUENT) 150 MG/ML SOPN Inject 150 mg into the skin every 14 (fourteen) days. 6 pen 3  . aspirin 81 MG tablet Take 81 mg by mouth daily.    . cholecalciferol (VITAMIN D) 1000 UNITS tablet Take 1 tablet (1,000 Units total) by mouth daily.    . cyclobenzaprine (FLEXERIL) 10 MG tablet Take 1 tablet (10 mg total) by mouth 3  (three) times daily as needed for muscle spasms. 15 tablet 0  . fish oil-omega-3 fatty acids 1000 MG capsule Take 1 g by mouth daily.     . Glucosamine-Chondroit-Vit C-Mn (GLUCOSAMINE 1500 COMPLEX PO) Take 1 tablet by mouth daily.     Marland Kitchen lisinopril (PRINIVIL,ZESTRIL) 10 MG tablet TAKE 1 TABLET BY MOUTH ONCE DAILY. 90 tablet 2  . meloxicam (MOBIC) 7.5 MG tablet as needed.    . metoprolol succinate (TOPROL-XL) 25 MG 24 hr tablet TAKE 1 TABLET BY MOUTH ONCE DAILY. 90 tablet 3  . OVER THE COUNTER MEDICATION Take 30 mLs by mouth at bedtime as needed. Z Quil- for sleep    . Phenyleph-CPM-DM-APAP (ALKA-SELTZER PLUS COLD & FLU PO) Take 2 tablets by mouth every 6 (six) hours as needed. For cold symptoms    . psyllium (HYDROCIL/METAMUCIL) 95 % PACK Take 1 packet by mouth daily.    . rosuvastatin (CRESTOR) 5 MG tablet Take 5 mg by mouth once a week.    . traMADol (ULTRAM) 50 MG tablet as needed.     No current facility-administered medications for this visit.    Allergies:    Allergies  Allergen Reactions  . Statins     Failed Crestor 5 mg twice weekly, Crestor 20 mg daily, Pravastatin 40 mg qd, Lipitor, Zocor - muscle aches  . Zetia [Ezetimibe]     Muscle aches  Social History:  The patient  reports that he quit smoking about 9 years ago. His smoking use included Cigarettes. His smokeless tobacco use includes Snuff. He reports that he drinks alcohol. He reports that he does not use illicit drugs.   ROS:  Please see the history of present illness.   Denies any fevers, chills, syncope, orthopnea, PND  PHYSICAL EXAM: VS:  BP 130/76 mmHg  Pulse 63  Ht 5\' 6"  (1.676 m)  Wt 183 lb 6.4 oz (83.19 kg)  BMI 29.62 kg/m2 Well nourished, well developed, in no acute distress HEENT: normal Neck: no JVD right carotid endarterectomy scar noted Cardiac:  normal S1, S2; RRR; no murmur Lungs:  clear to auscultation bilaterally, no wheezing, rhonchi or rales Abd: soft, nontender, no hepatomegaly Ext: no  edema Skin: warm and dry Neuro: no focal abnormalities noted  EKG:  Today 02/03/15-sinus rhythm, 64, nonspecific T-wave changes. 01/29/14-normal sinus rhythm, 64, nonspecific T-wave changes, flattening. Prior 01/29/13: Sinus rhythm rate 68 with nonspecific T-wave changes.     ASSESSMENT AND PLAN:  1. Coronary artery disease-previously described as nonobstructive 60% RCA lesion with reassuring nuclear stress test showing no evidence of ischemia in that territory. Continuing with secondary prevention efforts. Unable to tolerate statin therapy. We have had lengthy discussions about this in the past. Had lengthy discussion about cost of healthcare, cost of medications, studies. 2. Peripheral vascular disease/carotid artery disease-prior right sided carotid endarterectomy. 3. Hypertension-  Multidrug regimen. Elevated today, previously well controlled. Continuing to work with both me and Dr. Harrington Challenger. Continue current medications 4. Hyperlipidemia-Tried Praulent but denial letter 06/18/14. They stated that they are denying because LDL is less than 100 but he has not achieved a 45% reduction in LDL. Elberta Leatherwood, pharmacist was not quite sure of the reasoning given they have approved it on 2 other occasions. An option was to recheck patient's labs to see if there has been enough improvement to meet the 45% reduction requirement. LDL in March was 62, in December was 108 demonstrating greater than 50% reduction in LDL with Praulent.unable to tolerate statins as above. Taking Praulent. Elberta Leatherwood, Pharm.D. with lipid clinic currently discussing with him. 50% reduction in LDL. Now has samples.   Signed, Candee Furbish, MD [facility]  02/03/2015 8:44 AM

## 2015-02-03 NOTE — Patient Instructions (Signed)

## 2015-02-04 ENCOUNTER — Encounter: Payer: Self-pay | Admitting: Family

## 2015-02-04 ENCOUNTER — Telehealth: Payer: Self-pay | Admitting: Pharmacist

## 2015-02-04 MED ORDER — ALIROCUMAB 150 MG/ML ~~LOC~~ SOPN: 150.0000 mg | PEN_INJECTOR | SUBCUTANEOUS | Status: DC

## 2015-02-04 NOTE — Telephone Encounter (Signed)
New PA sent for Praluent reauthorization since patient now has Medicare. Called patient to let him know Praluent has been re-approved. New Rx sent to [place].

## 2015-02-09 ENCOUNTER — Ambulatory Visit: Payer: BLUE CROSS/BLUE SHIELD | Admitting: Pharmacist

## 2015-02-10 ENCOUNTER — Telehealth: Payer: Self-pay | Admitting: *Deleted

## 2015-02-10 ENCOUNTER — Encounter: Payer: Self-pay | Admitting: Pharmacist

## 2015-02-10 ENCOUNTER — Encounter (HOSPITAL_COMMUNITY): Payer: BLUE CROSS/BLUE SHIELD

## 2015-02-10 ENCOUNTER — Encounter: Payer: Self-pay | Admitting: Family

## 2015-02-10 ENCOUNTER — Ambulatory Visit: Payer: Self-pay | Admitting: Family

## 2015-02-10 ENCOUNTER — Ambulatory Visit (INDEPENDENT_AMBULATORY_CARE_PROVIDER_SITE_OTHER): Payer: PPO | Admitting: Family

## 2015-02-10 ENCOUNTER — Ambulatory Visit (HOSPITAL_COMMUNITY)
Admission: RE | Admit: 2015-02-10 | Discharge: 2015-02-10 | Disposition: A | Discharge status: Home / Self Care | Payer: PPO | Source: Ambulatory Visit | Attending: Family | Admitting: Family

## 2015-02-10 VITALS — BP 188/118 | HR 81 | Temp 98.0°F | Resp 18 | Ht 66.0 in | Wt 181.0 lb

## 2015-02-10 DIAGNOSIS — E785 Hyperlipidemia, unspecified: Secondary | ICD-10-CM

## 2015-02-10 DIAGNOSIS — I6522 Occlusion and stenosis of left carotid artery: Secondary | ICD-10-CM

## 2015-02-10 DIAGNOSIS — R030 Elevated blood-pressure reading, without diagnosis of hypertension: Secondary | ICD-10-CM | POA: Diagnosis not present

## 2015-02-10 DIAGNOSIS — Z9889 Other specified postprocedural states: Secondary | ICD-10-CM | POA: Diagnosis not present

## 2015-02-10 DIAGNOSIS — IMO0001 Reserved for inherently not codable concepts without codable children: Secondary | ICD-10-CM

## 2015-02-10 DIAGNOSIS — Z48812 Encounter for surgical aftercare following surgery on the circulatory system: Secondary | ICD-10-CM

## 2015-02-10 DIAGNOSIS — I6523 Occlusion and stenosis of bilateral carotid arteries: Secondary | ICD-10-CM | POA: Diagnosis not present

## 2015-02-10 NOTE — Telephone Encounter (Signed)
This encounter was created in error - please disregard.

## 2015-02-10 NOTE — Telephone Encounter (Signed)
[id number] pharmaceutical company left a vm on the refill line stating that they have been unsuccessful at reaching the patient to schedule delivery of the praluent. They have the same contact information that we have for the patient and they stated that they have left voicemails, but have never received a return phone call. I called the patient and left a voicemail for him to contact the office. The call back number for the company is [phone].

## 2015-02-10 NOTE — Progress Notes (Signed)
Filed Vitals:   02/10/15 1408 02/10/15 1416 02/10/15 1417  BP: 162/104 157/101 188/118  Pulse: 81 81 81  Temp: 98 F (36.7 C)    Resp: 18    Height: 5\' 6"  (1.676 m)    Weight: 181 lb (82.101 kg)    SpO2: 95%

## 2015-02-10 NOTE — Telephone Encounter (Signed)
Called patient - he did not set up delivery for Praluent because his copays are >$300 per month. He has 2 injections of Praluent left. Informed patient that we have samples that we can help him with occasionally and that we are waiting for the [employer] to open up for extra financial assistance for our Medicare patients. He would like to try to self titrate up his Crestor 5mg  that he has been taking once a week in the hopes that he will not need the Praluent in the future. He also inquired about taking CoQ10 to help with myalgias - informed patient that he can try at least 200mg  a day to see if that helps with myalgias as he is titrating up his Crestor. Per patient request, will also recheck lipids in 3 months.

## 2015-02-10 NOTE — Progress Notes (Signed)
Chief Complaint: Extracranial Carotid Artery Stenosis   History of Present Illness  [name] is a 64 y.o. male patient of Dr. Donnetta Hutching who has a history of a right CEA with Dacron patch in 2007.  He returns today for follow up. He had 2 episodes of amaurosis fugax on one eye, he cannot remember which eye, before he had the CEA and has had no further episodes.  The patient denies a hx of unilateral facial drooping, hemiplegia, or receptive or expressive aphasia. He states his blood pressure last week in Dr. Marlou Porch office was "perfect"; review of records, on 02/03/15 was 130/76; is quite elevated today.  The pain in the lower portion of both feet, worse at night, has improved, alleviated by walking; he states this is being evaluated. He also has a history of lumbar spine surgery for sciatica. He found that his shoes were too small, larger sized shoes and his feet have improved. He sees a Restaurant manager, fast food every 6 weeks.   Patient denies New Medical or Surgical History. Pt denies claudication symptoms with walking.  Pt Diabetic: No  Pt smoker: former smoker, quit in 2007, using smokeless tobacco since 2011   Pt meds include:  Statin : No: states he cannot take statins due to severe myalgias, has tried 3-4 different statins , he started, subcut. Injection twice/month, Alirocumab in July, 2015 and LDL decreased from 152 to 70, per pt. He takes 5 mg Crestor once/week  Betablocker: No  ASA: Yes  Other anticoagulants/antiplatelets: none    Past Medical History  Diagnosis Date  . Carotid artery occlusion   . Arthritis   . Heart attack [facility]) Oct. 2009    Mild  . Hyperlipidemia   . Coronary atherosclerosis of native coronary artery 01/29/2013    Social History Social History  Substance Use Topics  . Smoking status: Former Smoker    Types: Cigarettes    Quit date: 02/28/2005  . Smokeless tobacco: Current User    Types: Snuff  . Alcohol Use: 0.0 oz/week    1-2 Glasses of wine  per week    Family History Family History  Problem Relation Age of Onset  . Heart attack Mother   . Coronary artery disease Mother   . Heart disease Mother     Carotid Stenosis and BPG and Heart Disease before age 91  . Diabetes Mother   . Hypertension Mother   . Heart attack Father   . Heart disease Father     BPG and Heart Disease before age 40  . Cancer Father   . Hypertension Father     Surgical History Past Surgical History  Procedure Laterality Date  . Spine surgery    . Carotid endarterectomy  01/05/2006    Right  CEA with DPA    Allergies  Allergen Reactions  . Statins     Failed Crestor 5 mg twice weekly, Crestor 20 mg daily, Pravastatin 40 mg qd, Lipitor, Zocor - muscle aches  . Zetia [Ezetimibe]     Muscle aches    Current Outpatient Prescriptions  Medication Sig Dispense Refill  . Alirocumab (PRALUENT) 150 MG/ML SOPN Inject 150 mg into the skin every 14 (fourteen) days. 2 pen 11  . aspirin 81 MG tablet Take 81 mg by mouth daily.    . cholecalciferol (VITAMIN D) 1000 UNITS tablet Take 1 tablet (1,000 Units total) by mouth daily.    . cyclobenzaprine (FLEXERIL) 10 MG tablet Take 1 tablet (10 mg total) by mouth 3 (  three) times daily as needed for muscle spasms. 15 tablet 0  . fish oil-omega-3 fatty acids 1000 MG capsule Take 1 g by mouth daily.     . Glucosamine-Chondroit-Vit C-Mn (GLUCOSAMINE 1500 COMPLEX PO) Take 1 tablet by mouth daily.     Marland Kitchen lisinopril (PRINIVIL,ZESTRIL) 10 MG tablet TAKE 1 TABLET BY MOUTH ONCE DAILY. 90 tablet 2  . meloxicam (MOBIC) 7.5 MG tablet as needed.    . metoprolol succinate (TOPROL-XL) 25 MG 24 hr tablet TAKE 1 TABLET BY MOUTH ONCE DAILY. 90 tablet 3  . OVER THE COUNTER MEDICATION Take 30 mLs by mouth at bedtime as needed. Z Quil- for sleep    . psyllium (HYDROCIL/METAMUCIL) 95 % PACK Take 1 packet by mouth daily.    . rosuvastatin (CRESTOR) 5 MG tablet Take 5 mg by mouth once a week.    . traMADol (ULTRAM) 50 MG tablet as  needed.    Marland Kitchen Phenyleph-CPM-DM-APAP (ALKA-SELTZER PLUS COLD & FLU PO) Take 2 tablets by mouth every 6 (six) hours as needed. For cold symptoms     No current facility-administered medications for this visit.    Review of Systems : See HPI for pertinent positives and negatives.  Physical Examination  Filed Vitals:   02/10/15 1408 02/10/15 1416  BP: 162/104 157/101  Pulse: 81 81  Temp: 98 F (36.7 C)   Resp: 18   Height: 5\' 6"  (1.676 m)   Weight: 181 lb (82.101 kg)   SpO2: 95%    Body mass index is 29.23 kg/(m^2).  General: WDWN male in NAD  GAIT: normal  Eyes: PERRLA  Pulmonary: CTAB, no rales,  rhonchi, or wheezing.  Cardiac: regular rhythm, no murmur detected.   VASCULAR EXAM  Carotid Bruits  Left  Right    Negative  Negative    Radial pulses: are 2+ palpable and = Aorta: is not palpable  LE Pulses  LEFT  RIGHT   POPLITEAL  not palpable  not palpable   POSTERIOR TIBIAL  palpable  palpable   DORSALIS PEDIS  ANTERIOR TIBIAL  not palpable  palpable    Gastrointestinal: soft, nontender, BS WNL, no r/g, no masses palpated.  Musculoskeletal: Negative muscle atrophy/wasting. M/S 5/5 throughout, Extremities without ischemic changes.  Neurologic: A&O X 3; Appropriate Affect, Speech is normal  CN 2-12 intact, Pain and light touch intact in extremities, Motor exam as listed above.         Non-Invasive Vascular Imaging CAROTID DUPLEX 02/10/2015   Right ICA: patent CEA site with no restenosis. Left ICA: 60 - 79 % stenosis. Significant increase in the left ICA stenosis compared to previous exam on 11/20/13.    Assessment: [name] is a 64 y.o. male who has a history of a right CEA with Dacron patch in 2007.  He had 2 episodes of amaurosis fugax in the same eye preoperatively, no TIA or stroke sx's subsequently.  Today's carotid duplex suggests a patent right CEA site with no restenosis and 60-79% left ICA stenosis.  Significant  increase in the left ICA stenosis compared to previous exam on 11/20/13.    I advised the pt to call his PCP's office today re his very elevated blood pressure.    Plan: Follow-up in 6 months with Carotid Duplex scan.   I discussed in depth with the patient the nature of atherosclerosis, and emphasized the importance of maximal medical management including strict control of blood pressure, blood glucose, and lipid levels, obtaining regular exercise, and continued cessation  of smoking.  The patient is aware that without maximal medical management the underlying atherosclerotic disease process will progress, limiting the benefit of any interventions. The patient was given information about stroke prevention and what symptoms should prompt the patient to seek immediate medical care. Thank you for allowing [place] to participate in this patient's care.  Clemon Chambers, RN, MSN, FNP-C [facility] Office: [phone]  Clinic Physician: Early  02/10/2015 2:17 PM

## 2015-02-10 NOTE — Patient Instructions (Signed)
Stroke Prevention Some medical conditions and behaviors are associated with an increased chance of having a stroke. You may prevent a stroke by making healthy choices and managing medical conditions. HOW CAN I REDUCE MY RISK OF HAVING A STROKE?   Stay physically active. Get at least 30 minutes of activity on most or all days.  Do not smoke. It may also be helpful to avoid exposure to secondhand smoke.  Limit alcohol use. Moderate alcohol use is considered to be:  No more than 2 drinks per day for men.  No more than 1 drink per day for nonpregnant women.  Eat healthy foods. This involves:  Eating 5 or more servings of fruits and vegetables a day.  Making dietary changes that address high blood pressure (hypertension), high cholesterol, diabetes, or obesity.  Manage your cholesterol levels.  Making food choices that are high in fiber and low in saturated fat, trans fat, and cholesterol may control cholesterol levels.  Take any prescribed medicines to control cholesterol as directed by your health care provider.  Manage your diabetes.  Controlling your carbohydrate and sugar intake is recommended to manage diabetes.  Take any prescribed medicines to control diabetes as directed by your health care provider.  Control your hypertension.  Making food choices that are low in salt (sodium), saturated fat, trans fat, and cholesterol is recommended to manage hypertension.  Ask your health care provider if you need treatment to lower your blood pressure. Take any prescribed medicines to control hypertension as directed by your health care provider.  If you are 18-39 years of age, have your blood pressure checked every 3-5 years. If you are 40 years of age or older, have your blood pressure checked every year.  Maintain a healthy weight.  Reducing calorie intake and making food choices that are low in sodium, saturated fat, trans fat, and cholesterol are recommended to manage  weight.  Stop drug abuse.  Avoid taking birth control pills.  Talk to your health care provider about the risks of taking birth control pills if you are over 35 years old, smoke, get migraines, or have ever had a blood clot.  Get evaluated for sleep disorders (sleep apnea).  Talk to your health care provider about getting a sleep evaluation if you snore a lot or have excessive sleepiness.  Take medicines only as directed by your health care provider.  For some people, aspirin or blood thinners (anticoagulants) are helpful in reducing the risk of forming abnormal blood clots that can lead to stroke. If you have the irregular heart rhythm of atrial fibrillation, you should be on a blood thinner unless there is a good reason you cannot take them.  Understand all your medicine instructions.  Make sure that other conditions (such as anemia or atherosclerosis) are addressed. SEEK IMMEDIATE MEDICAL CARE IF:   You have sudden weakness or numbness of the face, arm, or leg, especially on one side of the body.  Your face or eyelid droops to one side.  You have sudden confusion.  You have trouble speaking (aphasia) or understanding.  You have sudden trouble seeing in one or both eyes.  You have sudden trouble walking.  You have dizziness.  You have a loss of balance or coordination.  You have a sudden, severe headache with no known cause.  You have new chest pain or an irregular heartbeat. Any of these symptoms may represent a serious problem that is an emergency. Do not wait to see if the symptoms will   go away. Get medical help at once. Call your local emergency services (911 in [place] Do not drive yourself to the hospital.   This information is not intended to replace advice given to you by your health care provider. Make sure you discuss any questions you have with your health care provider.   Document Released: 03/24/2004 Document Revised: 03/07/2014 Document Reviewed:  08/17/2012 Elsevier Interactive Patient Education 2016 Elsevier Inc.  

## 2015-02-22 ENCOUNTER — Emergency Department (HOSPITAL_COMMUNITY): Payer: PPO

## 2015-02-22 ENCOUNTER — Encounter (HOSPITAL_COMMUNITY): Payer: Self-pay | Admitting: Emergency Medicine

## 2015-02-22 ENCOUNTER — Emergency Department (HOSPITAL_COMMUNITY)
Admission: EM | Admit: 2015-02-22 | Discharge: 2015-02-22 | Disposition: A | Discharge status: Home / Self Care | Payer: PPO | Attending: Emergency Medicine | Admitting: Emergency Medicine

## 2015-02-22 DIAGNOSIS — I2510 Atherosclerotic heart disease of native coronary artery without angina pectoris: Secondary | ICD-10-CM | POA: Insufficient documentation

## 2015-02-22 DIAGNOSIS — R079 Chest pain, unspecified: Secondary | ICD-10-CM

## 2015-02-22 DIAGNOSIS — I252 Old myocardial infarction: Secondary | ICD-10-CM | POA: Diagnosis not present

## 2015-02-22 DIAGNOSIS — Z79899 Other long term (current) drug therapy: Secondary | ICD-10-CM | POA: Insufficient documentation

## 2015-02-22 DIAGNOSIS — Z87891 Personal history of nicotine dependence: Secondary | ICD-10-CM | POA: Diagnosis not present

## 2015-02-22 DIAGNOSIS — Z7982 Long term (current) use of aspirin: Secondary | ICD-10-CM | POA: Diagnosis not present

## 2015-02-22 DIAGNOSIS — M1990 Unspecified osteoarthritis, unspecified site: Secondary | ICD-10-CM | POA: Insufficient documentation

## 2015-02-22 DIAGNOSIS — E785 Hyperlipidemia, unspecified: Secondary | ICD-10-CM | POA: Diagnosis not present

## 2015-02-22 LAB — BASIC METABOLIC PANEL
Anion gap: 10 (ref 5–15)
BUN: 19 mg/dL (ref 6–20)
CALCIUM: 9.4 mg/dL (ref 8.9–10.3)
CO2: 26 mmol/L (ref 22–32)
CREATININE: 0.84 mg/dL (ref 0.61–1.24)
Chloride: 102 mmol/L (ref 101–111)
GFR calc Af Amer: >60 mL/min (ref >60)
GLUCOSE: 113 mg/dL — AB (ref 65–99)
POTASSIUM: 4.5 mmol/L (ref 3.5–5.1)
Sodium: 138 mmol/L (ref 135–145)

## 2015-02-22 LAB — CBC
HEMATOCRIT: 46.2 % (ref 39.0–52.0)
Hemoglobin: 15.4 g/dL (ref 13.0–17.0)
MCH: 31.5 pg (ref 26.0–34.0)
MCHC: 33.3 g/dL (ref 30.0–36.0)
MCV: 94.5 fL (ref 78.0–100.0)
Platelets: 191 10*3/uL (ref 150–400)
RBC: 4.89 MIL/uL (ref 4.22–5.81)
RDW: 13.1 % (ref 11.5–15.5)
WBC: 12.3 10*3/uL — ABNORMAL HIGH (ref 4.0–10.5)

## 2015-02-22 LAB — I-STAT TROPONIN, ED: Troponin i, poc: 0.01 ng/mL (ref 0.00–0.08)

## 2015-02-22 MED ORDER — METOPROLOL SUCCINATE ER 25 MG PO TB24
25.0000 mg | ORAL_TABLET | Freq: Every day | ORAL | Status: DC
  Administered 2015-02-22: 25 mg via ORAL
  Filled 2015-02-22: qty 1

## 2015-02-22 MED ORDER — LISINOPRIL 10 MG PO TABS
10.0000 mg | ORAL_TABLET | Freq: Once | ORAL | Status: AC
  Administered 2015-02-22: 10 mg via ORAL
  Filled 2015-02-22: qty 1

## 2015-02-22 MED ORDER — GI COCKTAIL ~~LOC~~
30.0000 mL | Freq: Once | ORAL | Status: AC
  Administered 2015-02-22: 30 mL via ORAL
  Filled 2015-02-22: qty 30

## 2015-02-22 NOTE — Discharge Instructions (Signed)
Tests were good today. Recommend follow-up with cardiologist if symptoms persist. Over-the-counter antiacid for esophageal spasm.  Make a log of your blood pressure measurements and share information with your health care provider

## 2015-02-22 NOTE — ED Notes (Signed)
Per patient been having epigastric type chest pain since eating a sausage biscuit yesterday.  States it almost choked me and now I have a pressure.  Was able to eat dinner last night but did recall some discomfort.

## 2015-02-22 NOTE — ED Provider Notes (Signed)
CSN: [id number]     Arrival date & time 02/22/15  [id number] History   First MD Initiated Contact with Patient 02/22/15 [phone]     Chief Complaint  Patient presents with  . Chest Pain     (Consider location/radiation/quality/duration/timing/severity/associated sxs/prior Treatment) Patient is a 64 y.o. male presenting with chest pain.  Chest Pain .... Patient describes central chest discomfort immediately after eating sausage biscuit yesterday AM while playing golf. He felt that the food was stuck in his esophagus. Symptoms started immediately after ingestion of biscuit. No dyspnea, diaphoresis, nausea. Patient has a history of MI in the past. Nonsmoker. Severity of symptoms mild to moderate. He has not taken his blood pressure medicine today.  Past Medical History  Diagnosis Date  . Carotid artery occlusion   . Arthritis   . Heart attack [facility]) Oct. 2009    Mild  . Hyperlipidemia   . Coronary atherosclerosis of native coronary artery 01/29/2013   Past Surgical History  Procedure Laterality Date  . Spine surgery    . Carotid endarterectomy  01/05/2006    Right  CEA with DPA   Family History  Problem Relation Age of Onset  . Heart attack Mother   . Coronary artery disease Mother   . Heart disease Mother     Carotid Stenosis and BPG and Heart Disease before age 89  . Diabetes Mother   . Hypertension Mother   . Heart attack Father   . Heart disease Father     BPG and Heart Disease before age 63  . Cancer Father   . Hypertension Father    Social History  Substance Use Topics  . Smoking status: Former Smoker    Types: Cigarettes    Quit date: 02/28/2005  . Smokeless tobacco: Current User    Types: Snuff  . Alcohol Use: 0.0 oz/week    1-2 Glasses of wine per week    Review of Systems  Cardiovascular: Positive for chest pain.  All other systems reviewed and are negative.     Allergies  Statins and Zetia  Home Medications   Prior to Admission medications   Medication  Sig Start Date End Date Taking? Authorizing Provider  Alirocumab (PRALUENT) 150 MG/ML SOPN Inject 150 mg into the skin every 14 (fourteen) days. 02/04/15  Yes [name], MD  aspirin 81 MG tablet Take 81 mg by mouth daily.   Yes Historical Provider, MD  cholecalciferol (VITAMIN D) 1000 UNITS tablet Take 1 tablet (1,000 Units total) by mouth daily. 01/29/13  Yes [name], MD  cyclobenzaprine (FLEXERIL) 10 MG tablet Take 1 tablet (10 mg total) by mouth 3 (three) times daily as needed for muscle spasms. 12/12/11  Yes Christopher Lawyer, PA-C  Glucosamine-Chondroit-Vit C-Mn (GLUCOSAMINE 1500 COMPLEX PO) Take 1 tablet by mouth daily.    Yes Historical Provider, MD  lisinopril (PRINIVIL,ZESTRIL) 10 MG tablet TAKE 1 TABLET BY MOUTH ONCE DAILY. 04/03/14  Yes [name], MD  meloxicam (MOBIC) 7.5 MG tablet Take 7.5 mg by mouth daily as needed for pain.  11/01/13  Yes Historical Provider, MD  metoprolol succinate (TOPROL-XL) 25 MG 24 hr tablet TAKE 1 TABLET BY MOUTH ONCE DAILY. 02/03/14  Yes [name], MD  OVER THE COUNTER MEDICATION Take 30 mLs by mouth at bedtime as needed (sleep). Z Quil- for sleep   Yes Historical Provider, MD  Phenyleph-CPM-DM-APAP (ALKA-SELTZER PLUS COLD & FLU PO) Take 2 tablets by mouth every 6 (six) hours as needed. For cold  symptoms   Yes Historical Provider, MD  rosuvastatin (CRESTOR) 5 MG tablet Take 5 mg by mouth once a week.   Yes Historical Provider, MD  traMADol (ULTRAM) 50 MG tablet Take 50 mg by mouth every 6 (six) hours as needed for moderate pain.  11/05/13  Yes Historical Provider, MD   BP 131/92 mmHg  Pulse 93  Temp(Src) 97.9 F (36.6 C) (Oral)  Resp 16  Ht 5\' 6"  (1.676 m)  Wt 178 lb (80.74 kg)  BMI 28.74 kg/m2  SpO2 96% Physical Exam  Constitutional: He is oriented to person, place, and time. He appears well-developed and well-nourished.  HENT:  Head: Normocephalic and atraumatic.  Eyes: Conjunctivae and EOM are normal. Pupils are equal, round, and  reactive to light.  Neck: Normal range of motion. Neck supple.  Cardiovascular: Normal rate and regular rhythm.   Pulmonary/Chest: Effort normal and breath sounds normal.  Abdominal: Soft. Bowel sounds are normal.  Musculoskeletal: Normal range of motion.  Neurological: He is alert and oriented to person, place, and time.  Skin: Skin is warm and dry.  Psychiatric: He has a normal mood and affect. His behavior is normal.  Nursing note and vitals reviewed.   ED Course  Procedures (including critical care time) Labs Review Labs Reviewed  BASIC METABOLIC PANEL - Abnormal; Notable for the following:    Glucose, Bld 113 (*)    All other components within normal limits  CBC - Abnormal; Notable for the following:    WBC 12.3 (*)    All other components within normal limits  I-STAT TROPOININ, ED    Imaging Review Dg Chest 2 View  02/22/2015  CLINICAL DATA:  The patient choked on for yesterday with continue upper chest discomfort and pressure. EXAM: CHEST  2 VIEW COMPARISON:  December 12, 2011 FINDINGS: The heart size and mediastinal contours are within normal limits. Both lungs are clear. No radiopaque foreign body is identified. The visualized skeletal structures are unremarkable. IMPRESSION: No active cardiopulmonary disease. Electronically Signed   By: Abelardo Diesel M.D.   On: 02/22/2015 07:28   I have personally reviewed and evaluated these images and lab results as part of my medical decision-making.   EKG Interpretation   Date/Time:  Sunday February 22 2015 06:49:59 EST Ventricular Rate:  84 PR Interval:  143 QRS Duration: 88 QT Interval:  359 QTC Calculation: 424 R Axis:   22 Text Interpretation:  Sinus rhythm Borderline T wave abnormalities When  compared with ECG of 12/06/2007, No significant change was found Confirmed  by [facility]  MD, DAVID ([id number]) on 02/22/2015 6:54:20 AM Also confirmed by  Lacinda Axon  MD, Meegan Shanafelt ([id number])  on 02/22/2015 7:19:49 AM Also confirmed by Lacinda Axon   MD,  Henryetta Corriveau ([id number])  on 02/22/2015 8:08:04 AM Also confirmed by Lacinda Axon  MD,  Deauna Yaw ([id number])  on 02/22/2015 11:05:48 AM      MDM   Final diagnoses:  Chest pain, unspecified chest pain type    Screening tests including EKG, chest x-ray, troponin all negative. Symptoms appear to start immediately after eating yesterday. GI cocktail helped. He understands to return if worse and follow-up with cardiology if symptoms persist.    Nat Christen, MD 02/22/15 [phone]

## 2015-03-13 ENCOUNTER — Other Ambulatory Visit: Payer: Self-pay | Admitting: Cardiology

## 2015-03-23 DIAGNOSIS — M5386 Other specified dorsopathies, lumbar region: Secondary | ICD-10-CM | POA: Diagnosis not present

## 2015-03-23 DIAGNOSIS — M5384 Other specified dorsopathies, thoracic region: Secondary | ICD-10-CM | POA: Diagnosis not present

## 2015-03-23 DIAGNOSIS — M5382 Other specified dorsopathies, cervical region: Secondary | ICD-10-CM | POA: Diagnosis not present

## 2015-03-23 DIAGNOSIS — M9902 Segmental and somatic dysfunction of thoracic region: Secondary | ICD-10-CM | POA: Diagnosis not present

## 2015-03-23 DIAGNOSIS — M9903 Segmental and somatic dysfunction of lumbar region: Secondary | ICD-10-CM | POA: Diagnosis not present

## 2015-03-23 DIAGNOSIS — M9901 Segmental and somatic dysfunction of cervical region: Secondary | ICD-10-CM | POA: Diagnosis not present

## 2015-04-20 DIAGNOSIS — H25043 Posterior subcapsular polar age-related cataract, bilateral: Secondary | ICD-10-CM | POA: Diagnosis not present

## 2015-04-21 ENCOUNTER — Other Ambulatory Visit: Payer: Self-pay | Admitting: Cardiology

## 2015-05-07 DIAGNOSIS — M7062 Trochanteric bursitis, left hip: Secondary | ICD-10-CM | POA: Diagnosis not present

## 2015-05-07 DIAGNOSIS — M76892 Other specified enthesopathies of left lower limb, excluding foot: Secondary | ICD-10-CM | POA: Diagnosis not present

## 2015-05-11 ENCOUNTER — Other Ambulatory Visit (INDEPENDENT_AMBULATORY_CARE_PROVIDER_SITE_OTHER): Payer: PPO | Admitting: *Deleted

## 2015-05-11 DIAGNOSIS — E785 Hyperlipidemia, unspecified: Secondary | ICD-10-CM

## 2015-05-11 LAB — HEPATIC FUNCTION PANEL
ALT: 32 U/L (ref 9–46)
AST: 24 U/L (ref 10–35)
Albumin: 3.8 g/dL (ref 3.6–5.1)
Alkaline Phosphatase: 70 U/L (ref 40–115)
BILIRUBIN DIRECT: 0.1 mg/dL (ref <=0.2)
BILIRUBIN INDIRECT: 0.4 mg/dL (ref 0.2–1.2)
TOTAL PROTEIN: 6.5 g/dL (ref 6.1–8.1)
Total Bilirubin: 0.5 mg/dL (ref 0.2–1.2)

## 2015-05-11 LAB — LIPID PANEL
CHOL/HDL RATIO: 4.6 ratio (ref <=5.0)
CHOLESTEROL: 229 mg/dL — AB (ref 125–200)
HDL: 50 mg/dL (ref >=40)
LDL CALC: 152 mg/dL — AB (ref <130)
Triglycerides: 136 mg/dL (ref <150)
VLDL: 27 mg/dL (ref <30)

## 2015-05-12 ENCOUNTER — Other Ambulatory Visit: Payer: Self-pay | Admitting: Pharmacist

## 2015-05-12 MED ORDER — ROSUVASTATIN CALCIUM 5 MG PO TABS: 5.0000 mg | ORAL_TABLET | Freq: Every day | ORAL | Status: DC

## 2015-05-18 DIAGNOSIS — M25552 Pain in left hip: Secondary | ICD-10-CM | POA: Diagnosis not present

## 2015-06-15 ENCOUNTER — Other Ambulatory Visit: Payer: Self-pay | Admitting: Family Medicine

## 2015-06-15 DIAGNOSIS — H6120 Impacted cerumen, unspecified ear: Secondary | ICD-10-CM | POA: Diagnosis not present

## 2015-06-15 DIAGNOSIS — M2550 Pain in unspecified joint: Secondary | ICD-10-CM | POA: Diagnosis not present

## 2015-06-15 DIAGNOSIS — Z87891 Personal history of nicotine dependence: Secondary | ICD-10-CM

## 2015-06-15 DIAGNOSIS — Z23 Encounter for immunization: Secondary | ICD-10-CM | POA: Diagnosis not present

## 2015-06-15 DIAGNOSIS — E7800 Pure hypercholesterolemia, unspecified: Secondary | ICD-10-CM | POA: Diagnosis not present

## 2015-06-15 DIAGNOSIS — R0609 Other forms of dyspnea: Secondary | ICD-10-CM | POA: Diagnosis not present

## 2015-06-15 DIAGNOSIS — I10 Essential (primary) hypertension: Secondary | ICD-10-CM | POA: Diagnosis not present

## 2015-06-15 DIAGNOSIS — Z125 Encounter for screening for malignant neoplasm of prostate: Secondary | ICD-10-CM | POA: Diagnosis not present

## 2015-06-15 DIAGNOSIS — Z0000 Encounter for general adult medical examination without abnormal findings: Secondary | ICD-10-CM | POA: Diagnosis not present

## 2015-06-15 LAB — BASIC METABOLIC PANEL
BUN: 20 mg/dL (ref 4–21)
BUN: 20 mg/dL (ref 4–21)
CREATININE: 0.9 mg/dL (ref 0.6–1.3)
CREATININE: 0.9 mg/dL (ref <=1.3)
GLUCOSE: 92 mg/dL
Glucose: 92 mg/dL
POTASSIUM: 4.9 mmol/L (ref 3.4–5.3)
Potassium: 4.9 mmol/L (ref 3.4–5.3)
Sodium: 142 mmol/L (ref 137–147)
Sodium: 142 mmol/L (ref 137–147)

## 2015-06-15 LAB — LIPID PANEL
CHOLESTEROL: 237 mg/dL — AB (ref 0–200)
HDL: 60 mg/dL (ref 35–70)
LDL Cholesterol: 145 mg/dL
TRIGLYCERIDES: 160 mg/dL (ref 40–160)

## 2015-06-15 LAB — PSA: PSA: 0.83

## 2015-06-15 LAB — HEPATIC FUNCTION PANEL
ALT: 32 U/L (ref 10–40)
AST: 24 U/L (ref 14–40)

## 2015-06-23 ENCOUNTER — Other Ambulatory Visit: Payer: Self-pay | Admitting: Family Medicine

## 2015-06-23 ENCOUNTER — Ambulatory Visit
Admission: RE | Admit: 2015-06-23 | Discharge: 2015-06-23 | Disposition: A | Discharge status: Home / Self Care | Payer: PPO | Source: Ambulatory Visit | Attending: Family Medicine | Admitting: Family Medicine

## 2015-06-23 DIAGNOSIS — Z136 Encounter for screening for cardiovascular disorders: Secondary | ICD-10-CM | POA: Diagnosis not present

## 2015-06-23 DIAGNOSIS — Z87891 Personal history of nicotine dependence: Secondary | ICD-10-CM

## 2015-06-24 DIAGNOSIS — M7062 Trochanteric bursitis, left hip: Secondary | ICD-10-CM | POA: Diagnosis not present

## 2015-06-24 DIAGNOSIS — M25552 Pain in left hip: Secondary | ICD-10-CM | POA: Diagnosis not present

## 2015-06-30 ENCOUNTER — Encounter: Payer: Self-pay | Admitting: Cardiology

## 2015-06-30 ENCOUNTER — Ambulatory Visit (INDEPENDENT_AMBULATORY_CARE_PROVIDER_SITE_OTHER): Payer: PPO | Admitting: Cardiology

## 2015-06-30 VITALS — BP 116/78 | HR 80 | Ht 66.0 in | Wt 172.0 lb

## 2015-06-30 DIAGNOSIS — I2510 Atherosclerotic heart disease of native coronary artery without angina pectoris: Secondary | ICD-10-CM

## 2015-06-30 DIAGNOSIS — R0789 Other chest pain: Secondary | ICD-10-CM

## 2015-06-30 DIAGNOSIS — I739 Peripheral vascular disease, unspecified: Secondary | ICD-10-CM

## 2015-06-30 DIAGNOSIS — R0609 Other forms of dyspnea: Secondary | ICD-10-CM | POA: Diagnosis not present

## 2015-06-30 DIAGNOSIS — I6529 Occlusion and stenosis of unspecified carotid artery: Secondary | ICD-10-CM

## 2015-06-30 DIAGNOSIS — R0600 Dyspnea, unspecified: Secondary | ICD-10-CM

## 2015-06-30 NOTE — Progress Notes (Signed)
[facility]. [address], [address] [place], [place]  [address] Phone: [phone] Fax:  [phone]  Date:  06/30/2015   ID:  [name], DOB Sep 12, 1950, MRN [id number]  PCP:   Melinda Crutch, MD   History of Present Illness: [name] is a 65 y.o. male with prior cardiac catheterization in October of 2009 following mildly positive troponin (isolated blood draw), mild non-ST elevation MI which revealed 60% right coronary artery stenosis. A nuclear stress test placed following this to determine if the lesion was flow limiting and stress test overall was reassuring. He exercised for 9 minutes.  Previously, had right carotid endarterectomy, amaurosis fugax. He was upset about the increase in cost of vascular ultrasound.  He is had issues in the past tolerating statin therapy, Crestor included. He is currently taking Pralulent. 50% reduction in LDL. Even with approval from his insurance company the medication was $1000 a month. He is unable to take this.  Noting more dyspnea on exertion, funny feeling in chest. Getting worse. Dr. Harrington Challenger wanted him to see me. ER on Christmas 2016. OK workup. Worried about dying in yard.  He still enjoys playing golf quite frequently. Left hip bursitis, Dr. Paralee Cancel performed left hip soft tissue injections. He has had MRI. He states it is hard getting old.   Wt Readings from Last 3 Encounters:  06/30/15 172 lb (78.019 kg)  02/22/15 178 lb (80.74 kg)  02/10/15 181 lb (82.101 kg)     Past Medical History  Diagnosis Date  . Carotid artery occlusion   . Arthritis   . Heart attack [facility]) Oct. 2009    Mild  . Hyperlipidemia   . Coronary atherosclerosis of native coronary artery 01/29/2013    Past Surgical History  Procedure Laterality Date  . Spine surgery    . Carotid endarterectomy  01/05/2006    Right  CEA with DPA    Current Outpatient Prescriptions  Medication Sig Dispense Refill  . acetaminophen-codeine (TYLENOL #3) 300-30 MG tablet Take 1  tablet by mouth every 6 (six) hours as needed. For pain  0  . Alirocumab (PRALUENT) 150 MG/ML SOPN Inject 150 mg into the skin every 14 (fourteen) days. 2 pen 11  . amLODipine (NORVASC) 5 MG tablet Take 1 tablet by mouth daily.  0  . aspirin 81 MG tablet Take 81 mg by mouth daily.    . cholecalciferol (VITAMIN D) 1000 UNITS tablet Take 1 tablet (1,000 Units total) by mouth daily.    . cyclobenzaprine (FLEXERIL) 10 MG tablet Take 1 tablet (10 mg total) by mouth 3 (three) times daily as needed for muscle spasms. 15 tablet 0  . diclofenac (VOLTAREN) 75 MG EC tablet Take 1 tablet by mouth 2 (two) times daily.  2  . diclofenac sodium (VOLTAREN) 1 % GEL Apply 1 application topically 2 (two) times daily as needed.  0  . Glucosamine-Chondroit-Vit C-Mn (GLUCOSAMINE 1500 COMPLEX PO) Take 1 tablet by mouth daily.     Marland Kitchen lisinopril (PRINIVIL,ZESTRIL) 10 MG tablet TAKE 1 TABLET BY MOUTH ONCE DAILY. 90 tablet 3  . meloxicam (MOBIC) 7.5 MG tablet Take 7.5 mg by mouth daily as needed for pain.     . metoprolol succinate (TOPROL-XL) 25 MG 24 hr tablet TAKE 1 TABLET BY MOUTH ONCE DAILY. 90 tablet 3  . OVER THE COUNTER MEDICATION Take 30 mLs by mouth at bedtime as needed (sleep). Z Quil- for sleep    . Phenyleph-CPM-DM-APAP (ALKA-SELTZER PLUS  COLD & FLU PO) Take 2 tablets by mouth every 6 (six) hours as needed. For cold symptoms    . rosuvastatin (CRESTOR) 5 MG tablet Take 1 tablet (5 mg total) by mouth daily. 30 tablet 3  . traMADol (ULTRAM) 50 MG tablet Take 50 mg by mouth every 6 (six) hours as needed for moderate pain.      No current facility-administered medications for this visit.    Allergies:    Allergies  Allergen Reactions  . Statins     Failed Crestor 5 mg twice weekly, Crestor 20 mg daily, Pravastatin 40 mg qd, Lipitor, Zocor - muscle aches  . Zetia [Ezetimibe]     Muscle aches    Social History:  The patient  reports that he quit smoking about 10 years ago. His smoking use included Cigarettes.  His smokeless tobacco use includes Snuff. He reports that he drinks alcohol. He reports that he does not use illicit drugs.   ROS:  Please see the history of present illness.   Denies any fevers, chills, syncope, orthopnea, PND  PHYSICAL EXAM: VS:  BP 116/78 mmHg  Pulse 80  Ht 5\' 6"  (1.676 m)  Wt 172 lb (78.019 kg)  BMI 27.77 kg/m2 Well nourished, well developed, in no acute distress HEENT: normal Neck: no JVD right carotid endarterectomy scar noted Cardiac:  normal S1, S2; RRR; no murmur Lungs:  clear to auscultation bilaterally, no wheezing, rhonchi or rales Abd: soft, nontender, no hepatomegaly Ext: no edema Skin: warm and dry Neuro: no focal abnormalities noted  EKG:   02/03/15-sinus rhythm, 64, nonspecific T-wave changes. 01/29/14-normal sinus rhythm, 64, nonspecific T-wave changes, flattening. Prior 01/29/13: Sinus rhythm rate 68 with nonspecific T-wave changes.     ASSESSMENT AND PLAN:  Coronary artery disease -Since he is having more exertional dyspnea symptoms and some occasional chest discomfort with exertion, we will proceed with nuclear stress test. -previously described as nonobstructive 60% RCA lesion with reassuring nuclear stress test showing no evidence of ischemia in that territory. Continuing with secondary prevention efforts. Unable to tolerate statin therapy. We have had lengthy discussions about this in the past. Had lengthy discussion about cost of healthcare, cost of medications, studies.  Peripheral vascular disease/carotid artery disease -prior right sided carotid endarterectomy.  Hypertension -  Multidrug regimen. Elevated today, previously well controlled. Continuing to work with both me and Dr. Harrington Challenger. Continue current medications  Hyperlipidemia -Praulent has been re-approved.  However $1000 a month is too much for medical therapy for him. He is not taking. He is trying to take the Crestor a few times a week. Continue to encourage.  One-year follow-up, we  will follow-up with results of stress test.  Signed, Candee Furbish, MD [facility]  06/30/2015 9:44 AM

## 2015-06-30 NOTE — Patient Instructions (Addendum)
Medication Instructions:  The current medical regimen is effective;  continue present plan and medications.  Testing/Procedures: Your physician has requested that you have en exercise stress myoview. For further information please visit [email or web address]. Please follow instruction sheet, as given.  Follow-Up: Follow up in 1 year with Dr. Marlou Porch.  You will receive a letter in the mail 2 months before you are due.  Please call us when you receive this letter to schedule your follow up appointment.  If you need a refill on your cardiac medications before your next appointment, please call your pharmacy.  Thank you for choosing [facility]!!

## 2015-07-01 ENCOUNTER — Telehealth (HOSPITAL_COMMUNITY): Payer: Self-pay | Admitting: *Deleted

## 2015-07-01 NOTE — Telephone Encounter (Signed)
Patient given detailed instructions per Myocardial Perfusion Study Information Sheet for the test on 07/06/15. Patient notified to arrive 15 minutes early and that it is imperative to arrive on time for appointment to keep from having the test rescheduled.  If you need to cancel or reschedule your appointment, please call the office within 24 hours of your appointment. Failure to do so may result in a cancellation of your appointment, and a $50 no show fee. Patient verbalized understanding.Hubbard Robinson, RN

## 2015-07-06 ENCOUNTER — Ambulatory Visit (HOSPITAL_COMMUNITY): Payer: PPO | Attending: Cardiology

## 2015-07-06 DIAGNOSIS — I2510 Atherosclerotic heart disease of native coronary artery without angina pectoris: Secondary | ICD-10-CM | POA: Insufficient documentation

## 2015-07-06 DIAGNOSIS — R0600 Dyspnea, unspecified: Secondary | ICD-10-CM

## 2015-07-06 DIAGNOSIS — R0609 Other forms of dyspnea: Secondary | ICD-10-CM | POA: Diagnosis not present

## 2015-07-06 DIAGNOSIS — I779 Disorder of arteries and arterioles, unspecified: Secondary | ICD-10-CM | POA: Diagnosis not present

## 2015-07-06 DIAGNOSIS — R9439 Abnormal result of other cardiovascular function study: Secondary | ICD-10-CM | POA: Diagnosis not present

## 2015-07-06 DIAGNOSIS — I119 Hypertensive heart disease without heart failure: Secondary | ICD-10-CM | POA: Insufficient documentation

## 2015-07-06 LAB — MYOCARDIAL PERFUSION IMAGING
CHL CUP MPHR: 155 {beats}/min
CHL CUP RESTING HR STRESS: 55 {beats}/min
CHL RATE OF PERCEIVED EXERTION: 18
CSEPEDS: 0 s
CSEPEW: 9.4 METS
CSEPPHR: 102 {beats}/min
Exercise duration (min): 8 min
LV sys vol: 70 mL
LVDIAVOL: 119 mL (ref 62–150)
NUC STRESS TID: 1.04
Percent HR: 66 %
RATE: 0.32
SDS: 6
SRS: 10
SSS: 13

## 2015-07-06 MED ORDER — REGADENOSON 0.4 MG/5ML IV SOLN
0.4000 mg | Freq: Once | INTRAVENOUS | Status: AC
  Administered 2015-07-06: 0.4 mg via INTRAVENOUS

## 2015-07-06 MED ORDER — TECHNETIUM TC 99M SESTAMIBI GENERIC - CARDIOLITE
32.5000 | Freq: Once | INTRAVENOUS | Status: AC | PRN
  Administered 2015-07-06: 33 via INTRAVENOUS

## 2015-07-06 MED ORDER — TECHNETIUM TC 99M SESTAMIBI GENERIC - CARDIOLITE
11.0000 | Freq: Once | INTRAVENOUS | Status: AC | PRN
  Administered 2015-07-06: 11 via INTRAVENOUS

## 2015-07-23 ENCOUNTER — Encounter: Payer: Self-pay | Admitting: Cardiology

## 2015-07-23 ENCOUNTER — Encounter: Payer: Self-pay | Admitting: *Deleted

## 2015-07-23 ENCOUNTER — Ambulatory Visit (INDEPENDENT_AMBULATORY_CARE_PROVIDER_SITE_OTHER): Payer: PPO | Admitting: Cardiology

## 2015-07-23 VITALS — BP 116/78 | HR 64 | Ht 66.0 in | Wt 178.0 lb

## 2015-07-23 DIAGNOSIS — I6529 Occlusion and stenosis of unspecified carotid artery: Secondary | ICD-10-CM

## 2015-07-23 DIAGNOSIS — Z889 Allergy status to unspecified drugs, medicaments and biological substances status: Secondary | ICD-10-CM

## 2015-07-23 DIAGNOSIS — R0609 Other forms of dyspnea: Secondary | ICD-10-CM | POA: Diagnosis not present

## 2015-07-23 DIAGNOSIS — I2510 Atherosclerotic heart disease of native coronary artery without angina pectoris: Secondary | ICD-10-CM

## 2015-07-23 DIAGNOSIS — Z01818 Encounter for other preprocedural examination: Secondary | ICD-10-CM | POA: Diagnosis not present

## 2015-07-23 DIAGNOSIS — R0600 Dyspnea, unspecified: Secondary | ICD-10-CM

## 2015-07-23 DIAGNOSIS — Z789 Other specified health status: Secondary | ICD-10-CM

## 2015-07-23 LAB — CBC
HEMATOCRIT: 42.9 % (ref 38.5–50.0)
HEMOGLOBIN: 14.8 g/dL (ref 13.2–17.1)
MCH: 31.6 pg (ref 27.0–33.0)
MCHC: 34.5 g/dL (ref 32.0–36.0)
MCV: 91.7 fL (ref 80.0–100.0)
MPV: 10.1 fL (ref 7.5–12.5)
Platelets: 209 10*3/uL (ref 140–400)
RBC: 4.68 MIL/uL (ref 4.20–5.80)
RDW: 13.1 % (ref 11.0–15.0)
WBC: 4.9 10*3/uL (ref 3.8–10.8)

## 2015-07-23 LAB — BASIC METABOLIC PANEL
BUN: 31 mg/dL — AB (ref 7–25)
CO2: 24 mmol/L (ref 20–31)
Calcium: 9.3 mg/dL (ref 8.6–10.3)
Chloride: 104 mmol/L (ref 98–110)
Creat: 1.12 mg/dL (ref 0.70–1.25)
GLUCOSE: 99 mg/dL (ref 65–99)
POTASSIUM: 4.4 mmol/L (ref 3.5–5.3)
Sodium: 139 mmol/L (ref 135–146)

## 2015-07-23 LAB — PROTIME-INR
INR: 0.91 (ref <1.50)
Prothrombin Time: 12.4 seconds (ref 11.6–15.2)

## 2015-07-23 NOTE — Progress Notes (Signed)
[facility]. [address], [address] [place], [place]  [address] Phone: [phone] Fax:  [phone]  Date:  07/23/2015   ID:  OCTAVE WOODROME, DOB December 14, 1950, MRN [id number]  PCP:   Melinda Crutch, MD   History of Present Illness: JOANDRY WOLZ is a 65 y.o. male with prior cardiac catheterization in October of 2009 following mildly positive troponin (isolated blood draw), mild non-ST elevation MI which revealed 60% right coronary artery stenosis. A nuclear stress test placed following this to determine if the lesion was flow limiting and stress test overall was reassuring at the time. He exercised for 9 minutes.  However, at prior visit, he had ER, chest pain eval. NUC stress this time 06/2015: Moderate size and intensity fixed apical and inferolateral perfusion defect, likely scar. No reversible ischemia. LVEF 41% with inferolateral akinesis. Submaximal exercise, study converted to lexiscan due to DOE. This is an intermediate risk study.  We discussed implications of the stress test, potential changes in his right coronary artery anatomy. In wishes to pursue cardiac catheterization.  Previously, had right carotid endarterectomy, amaurosis fugax. He was upset about the increase in cost of vascular ultrasound.  He is had issues in the past tolerating statin therapy, Crestor included. He is currently taking Pralulent. 50% reduction in LDL. Even with approval from his insurance company the medication was $1000 a month. He is unable to take this.  Noting more dyspnea on exertion, funny feeling in chest. Getting worse. Dr. Harrington Challenger wanted him to see me. ER on Christmas 2016. OK workup. Worried about dying in yard.  He still enjoys playing golf quite frequently. Left hip bursitis, Dr. Paralee Cancel performed left hip soft tissue injections. He has had MRI. He states it is hard getting old.  [name] wife here with him.   Wt Readings from Last 3 Encounters:  07/23/15 178 lb (80.74 kg)  07/06/15 172 lb (78.019  kg)  06/30/15 172 lb (78.019 kg)     Past Medical History  Diagnosis Date  . Carotid artery occlusion   . Arthritis   . Heart attack [facility]) Oct. 2009    Mild  . Hyperlipidemia   . Coronary atherosclerosis of native coronary artery 01/29/2013    Past Surgical History  Procedure Laterality Date  . Spine surgery    . Carotid endarterectomy  01/05/2006    Right  CEA with DPA    Current Outpatient Prescriptions  Medication Sig Dispense Refill  . acetaminophen-codeine (TYLENOL #3) 300-30 MG tablet Take 1 tablet by mouth every 6 (six) hours as needed. For pain  0  . amLODipine (NORVASC) 5 MG tablet Take 1 tablet by mouth daily.  0  . aspirin 81 MG tablet Take 81 mg by mouth daily.    . cholecalciferol (VITAMIN D) 1000 UNITS tablet Take 1 tablet (1,000 Units total) by mouth daily.    . cyclobenzaprine (FLEXERIL) 10 MG tablet Take 1 tablet (10 mg total) by mouth 3 (three) times daily as needed for muscle spasms. 15 tablet 0  . diclofenac (VOLTAREN) 75 MG EC tablet Take 1 tablet by mouth 2 (two) times daily.  2  . diclofenac sodium (VOLTAREN) 1 % GEL Apply 1 application topically 2 (two) times daily as needed.  0  . Glucosamine-Chondroit-Vit C-Mn (GLUCOSAMINE 1500 COMPLEX PO) Take 1 tablet by mouth daily.     Marland Kitchen lisinopril (PRINIVIL,ZESTRIL) 10 MG tablet TAKE 1 TABLET BY MOUTH ONCE DAILY. 90 tablet 3  . meloxicam (MOBIC) 7.5  MG tablet Take 7.5 mg by mouth daily as needed for pain.     . metoprolol succinate (TOPROL-XL) 25 MG 24 hr tablet TAKE 1 TABLET BY MOUTH ONCE DAILY. 90 tablet 3  . OVER THE COUNTER MEDICATION Take 30 mLs by mouth at bedtime as needed (sleep). Z Quil- for sleep    . Phenyleph-CPM-DM-APAP (ALKA-SELTZER PLUS COLD & FLU PO) Take 2 tablets by mouth every 6 (six) hours as needed. For cold symptoms    . rosuvastatin (CRESTOR) 5 MG tablet Take 1 tablet (5 mg total) by mouth daily. 30 tablet 3  . traMADol (ULTRAM) 50 MG tablet Take 50 mg by mouth every 6 (six) hours as needed for  moderate pain.      No current facility-administered medications for this visit.    Allergies:    Allergies  Allergen Reactions  . Statins     Failed Crestor 5 mg twice weekly, Crestor 20 mg daily, Pravastatin 40 mg qd, Lipitor, Zocor - muscle aches  . Zetia [Ezetimibe]     Muscle aches    Social History:  The patient  reports that he quit smoking about 10 years ago. His smoking use included Cigarettes. His smokeless tobacco use includes Snuff. He reports that he drinks alcohol. He reports that he does not use illicit drugs.   ROS:  Please see the history of present illness.   Denies any fevers, chills, syncope, orthopnea, PND  PHYSICAL EXAM: VS:  BP 116/78 mmHg  Pulse 64  Ht 5\' 6"  (1.676 m)  Wt 178 lb (80.74 kg)  BMI 28.74 kg/m2 Well nourished, well developed, in no acute distress HEENT: normal Neck: no JVD right carotid endarterectomy scar noted Cardiac:  normal S1, S2; RRR; no murmur Lungs:  clear to auscultation bilaterally, no wheezing, rhonchi or rales Abd: soft, nontender, no hepatomegaly Ext: no edema2+ radial pulse Skin: warm and dry Neuro: no focal abnormalities noted  EKG:   02/03/15-sinus rhythm, 64, nonspecific T-wave changes. 01/29/14-normal sinus rhythm, 64, nonspecific T-wave changes, flattening. Prior 01/29/13: Sinus rhythm rate 68 with nonspecific T-wave changes.     NUC stress test 07/06/15: Moderate size and intensity fixed apical and inferolateral perfusion defect, likely scar. No reversible ischemia. LVEF 41% with inferolateral akinesis. Submaximal exercise, study converted to lexiscan due to DOE. This is an intermediate risk study.   ASSESSMENT AND PLAN:  Coronary artery disease -Since he is having more exertional dyspnea symptoms and some occasional chest discomfort with exertion, and abnormal nuclear stress test as above with EF 41%, fixed inferolateral perfusion defect, we will proceed with cardiac catheterization. Risks and benefits include stroke,  heart attack, death were discussed. Radial artery approach. -previously described as nonobstructive 60% RCA lesion with reassuring nuclear stress test showing no evidence of ischemia in that territory previously. Now there is a fixed inferolateral defect, EF 41%. Continuing with secondary prevention efforts. Unable to tolerate statin therapy. We have had lengthy discussions about this in the past. Had lengthy discussion about cost of healthcare, cost of medications, studies, at prior visit.  Peripheral vascular disease/carotid artery disease -prior right sided carotid endarterectomy.  Hypertension -  Multidrug regimen. Elevated today, previously well controlled. Continuing to work with both me and Dr. Harrington Challenger. Continue current medications  Hyperlipidemia -Praulent has been re-approved.  However $1000 a month is too much for medical therapy for him. He is not taking. He is trying to take the Crestor a few times a week. Continue to encourage.  2 week post catheterization follow-up  Signed, Candee Furbish, MD [facility]  07/23/2015 10:21 AM

## 2015-07-23 NOTE — Patient Instructions (Signed)
Medication Instructions:  The current medical regimen is effective;  continue present plan and medications.  Labwork: Please have blood work today (CBC, BMP and PT/INR)  Testing/Procedures: Your physician has requested that you have a cardiac catheterization. Cardiac catheterization is used to diagnose and/or treat various heart conditions. Doctors may recommend this procedure for a number of different reasons. The most common reason is to evaluate chest pain. Chest pain can be a symptom of coronary artery disease (CAD), and cardiac catheterization can show whether plaque is narrowing or blocking your heart's arteries. This procedure is also used to evaluate the valves, as well as measure the blood flow and oxygen levels in different parts of your heart. For further information please visit [email or web address]. Please follow instruction sheet, as given.  Follow-Up: Follow up approximately 2 weeks after your cardiac cath.  If you need a refill on your cardiac medications before your next appointment, please call your pharmacy.  Thank you for choosing [facility]!!

## 2015-08-07 ENCOUNTER — Encounter (HOSPITAL_COMMUNITY): Payer: Self-pay | Admitting: Cardiology

## 2015-08-07 ENCOUNTER — Ambulatory Visit (HOSPITAL_COMMUNITY)
Admission: RE | Admit: 2015-08-07 | Discharge: 2015-08-08 | Disposition: A | Discharge status: Home / Self Care | Payer: PPO | Source: Ambulatory Visit | Attending: Cardiology | Admitting: Cardiology

## 2015-08-07 ENCOUNTER — Encounter (HOSPITAL_COMMUNITY)
Admission: RE | Disposition: A | Discharge status: Home / Self Care | Payer: Self-pay | Source: Ambulatory Visit | Attending: Cardiology

## 2015-08-07 DIAGNOSIS — I2510 Atherosclerotic heart disease of native coronary artery without angina pectoris: Secondary | ICD-10-CM

## 2015-08-07 DIAGNOSIS — Z7982 Long term (current) use of aspirin: Secondary | ICD-10-CM | POA: Diagnosis not present

## 2015-08-07 DIAGNOSIS — E785 Hyperlipidemia, unspecified: Secondary | ICD-10-CM | POA: Diagnosis not present

## 2015-08-07 DIAGNOSIS — Z9889 Other specified postprocedural states: Secondary | ICD-10-CM | POA: Diagnosis present

## 2015-08-07 DIAGNOSIS — Z87891 Personal history of nicotine dependence: Secondary | ICD-10-CM | POA: Insufficient documentation

## 2015-08-07 DIAGNOSIS — I10 Essential (primary) hypertension: Secondary | ICD-10-CM | POA: Diagnosis not present

## 2015-08-07 DIAGNOSIS — E782 Mixed hyperlipidemia: Secondary | ICD-10-CM | POA: Diagnosis present

## 2015-08-07 DIAGNOSIS — I739 Peripheral vascular disease, unspecified: Secondary | ICD-10-CM | POA: Insufficient documentation

## 2015-08-07 DIAGNOSIS — M1990 Unspecified osteoarthritis, unspecified site: Secondary | ICD-10-CM | POA: Insufficient documentation

## 2015-08-07 DIAGNOSIS — Z9861 Coronary angioplasty status: Secondary | ICD-10-CM

## 2015-08-07 DIAGNOSIS — R931 Abnormal findings on diagnostic imaging of heart and coronary circulation: Secondary | ICD-10-CM | POA: Diagnosis present

## 2015-08-07 DIAGNOSIS — I214 Non-ST elevation (NSTEMI) myocardial infarction: Secondary | ICD-10-CM | POA: Insufficient documentation

## 2015-08-07 DIAGNOSIS — I6529 Occlusion and stenosis of unspecified carotid artery: Secondary | ICD-10-CM | POA: Diagnosis not present

## 2015-08-07 DIAGNOSIS — I25118 Atherosclerotic heart disease of native coronary artery with other forms of angina pectoris: Secondary | ICD-10-CM

## 2015-08-07 HISTORY — DX: Essential (primary) hypertension: I10

## 2015-08-07 HISTORY — PX: CORONARY STENT PLACEMENT: SHX1402

## 2015-08-07 HISTORY — PX: CARDIAC CATHETERIZATION: SHX172

## 2015-08-07 LAB — POCT ACTIVATED CLOTTING TIME: Activated Clotting Time: 494 seconds

## 2015-08-07 SURGERY — LEFT HEART CATH AND CORONARY ANGIOGRAPHY

## 2015-08-07 MED ORDER — SODIUM CHLORIDE 0.9 % WEIGHT BASED INFUSION
3.0000 mL/kg/h | INTRAVENOUS | Status: AC
  Administered 2015-08-07: 3 mL/kg/h via INTRAVENOUS

## 2015-08-07 MED ORDER — MIDAZOLAM HCL 2 MG/2ML IJ SOLN
INTRAMUSCULAR | Status: AC
  Filled 2015-08-07: qty 2

## 2015-08-07 MED ORDER — SODIUM CHLORIDE 0.9% FLUSH
3.0000 mL | Freq: Two times a day (BID) | INTRAVENOUS | Status: DC
  Administered 2015-08-07: 22:00:00 3 mL via INTRAVENOUS

## 2015-08-07 MED ORDER — SODIUM CHLORIDE 0.9 % WEIGHT BASED INFUSION: 3.0000 mL/kg/h | INTRAVENOUS | Status: DC

## 2015-08-07 MED ORDER — ACETAMINOPHEN 325 MG PO TABS: 650.0000 mg | ORAL_TABLET | ORAL | Status: DC | PRN

## 2015-08-07 MED ORDER — NITROGLYCERIN 1 MG/10 ML FOR IR/CATH LAB
INTRA_ARTERIAL | Status: AC
  Filled 2015-08-07: qty 10

## 2015-08-07 MED ORDER — IOPAMIDOL (ISOVUE-370) INJECTION 76%
INTRAVENOUS | Status: DC | PRN
  Administered 2015-08-07: 155 mL via INTRA_ARTERIAL

## 2015-08-07 MED ORDER — ONDANSETRON HCL 4 MG/2ML IJ SOLN: 4.0000 mg | Freq: Four times a day (QID) | INTRAMUSCULAR | Status: DC | PRN

## 2015-08-07 MED ORDER — TICAGRELOR 90 MG PO TABS
90.0000 mg | ORAL_TABLET | Freq: Two times a day (BID) | ORAL | Status: DC
  Administered 2015-08-07 – 2015-08-08 (×2): 90 mg via ORAL
  Filled 2015-08-07 (×2): qty 1

## 2015-08-07 MED ORDER — SODIUM CHLORIDE 0.9% FLUSH: 3.0000 mL | INTRAVENOUS | Status: DC | PRN

## 2015-08-07 MED ORDER — HEPARIN (PORCINE) IN NACL 2-0.9 UNIT/ML-% IJ SOLN
INTRAMUSCULAR | Status: AC
  Filled 2015-08-07: qty 1500

## 2015-08-07 MED ORDER — BIVALIRUDIN 250 MG IV SOLR
INTRAVENOUS | Status: AC
  Filled 2015-08-07: qty 250

## 2015-08-07 MED ORDER — LISINOPRIL 10 MG PO TABS
10.0000 mg | ORAL_TABLET | Freq: Every day | ORAL | Status: DC
  Administered 2015-08-08: 10:00:00 10 mg via ORAL
  Filled 2015-08-07: qty 1

## 2015-08-07 MED ORDER — VERAPAMIL HCL 2.5 MG/ML IV SOLN
INTRAVENOUS | Status: AC
Start: 2015-08-07 — End: 2015-08-07
  Filled 2015-08-07: qty 2

## 2015-08-07 MED ORDER — TICAGRELOR 90 MG PO TABS
ORAL_TABLET | ORAL | Status: AC
  Filled 2015-08-07: qty 1

## 2015-08-07 MED ORDER — NITROGLYCERIN 1 MG/10 ML FOR IR/CATH LAB
INTRA_ARTERIAL | Status: DC | PRN
  Administered 2015-08-07 (×2): 200 ug via INTRACORONARY

## 2015-08-07 MED ORDER — TRAMADOL HCL 50 MG PO TABS
50.0000 mg | ORAL_TABLET | Freq: Four times a day (QID) | ORAL | Status: DC | PRN
Start: 2015-08-07 — End: 2015-08-08

## 2015-08-07 MED ORDER — TICAGRELOR 90 MG PO TABS
ORAL_TABLET | ORAL | Status: DC | PRN
  Administered 2015-08-07: 180 mg via ORAL

## 2015-08-07 MED ORDER — LIDOCAINE HCL (PF) 1 % IJ SOLN
INTRAMUSCULAR | Status: DC | PRN
  Administered 2015-08-07: 2 mL via INTRADERMAL

## 2015-08-07 MED ORDER — CYCLOBENZAPRINE HCL 10 MG PO TABS: 10.0000 mg | ORAL_TABLET | Freq: Three times a day (TID) | ORAL | Status: DC | PRN

## 2015-08-07 MED ORDER — MIDAZOLAM HCL 2 MG/2ML IJ SOLN
INTRAMUSCULAR | Status: DC | PRN
  Administered 2015-08-07 (×2): 1 mg via INTRAVENOUS
  Administered 2015-08-07: 2 mg via INTRAVENOUS

## 2015-08-07 MED ORDER — IOPAMIDOL (ISOVUE-370) INJECTION 76%
INTRAVENOUS | Status: AC
  Filled 2015-08-07: qty 100

## 2015-08-07 MED ORDER — ASPIRIN EC 81 MG PO TBEC
81.0000 mg | DELAYED_RELEASE_TABLET | Freq: Every day | ORAL | Status: DC
  Administered 2015-08-08: 11:00:00 81 mg via ORAL
  Filled 2015-08-07: qty 1

## 2015-08-07 MED ORDER — SODIUM CHLORIDE 0.9 % WEIGHT BASED INFUSION: 1.0000 mL/kg/h | INTRAVENOUS | Status: DC

## 2015-08-07 MED ORDER — SODIUM CHLORIDE 0.9 % IV SOLN: 250.0000 mL | INTRAVENOUS | Status: DC | PRN

## 2015-08-07 MED ORDER — VITAMIN D 1000 UNITS PO TABS
1000.0000 [IU] | ORAL_TABLET | Freq: Every day | ORAL | Status: DC
  Administered 2015-08-07 – 2015-08-08 (×2): 1000 [IU] via ORAL
  Filled 2015-08-07 (×2): qty 1

## 2015-08-07 MED ORDER — HEPARIN (PORCINE) IN NACL 2-0.9 UNIT/ML-% IJ SOLN
INTRAMUSCULAR | Status: DC | PRN
  Administered 2015-08-07: 1500 mL

## 2015-08-07 MED ORDER — FENTANYL CITRATE (PF) 100 MCG/2ML IJ SOLN
INTRAMUSCULAR | Status: DC | PRN
  Administered 2015-08-07 (×3): 25 ug via INTRAVENOUS

## 2015-08-07 MED ORDER — ASPIRIN 81 MG PO CHEW: 81.0000 mg | CHEWABLE_TABLET | ORAL | Status: DC

## 2015-08-07 MED ORDER — HEPARIN SODIUM (PORCINE) 1000 UNIT/ML IJ SOLN
INTRAMUSCULAR | Status: DC | PRN
  Administered 2015-08-07: 4000 [IU] via INTRAVENOUS

## 2015-08-07 MED ORDER — AMLODIPINE BESYLATE 5 MG PO TABS
5.0000 mg | ORAL_TABLET | Freq: Every day | ORAL | Status: DC
  Administered 2015-08-08: 5 mg via ORAL
  Filled 2015-08-07: qty 1

## 2015-08-07 MED ORDER — METOPROLOL SUCCINATE ER 25 MG PO TB24
25.0000 mg | ORAL_TABLET | Freq: Every day | ORAL | Status: DC
  Administered 2015-08-08: 25 mg via ORAL
  Filled 2015-08-07: qty 1

## 2015-08-07 MED ORDER — HEPARIN SODIUM (PORCINE) 1000 UNIT/ML IJ SOLN
INTRAMUSCULAR | Status: AC
  Filled 2015-08-07: qty 1

## 2015-08-07 MED ORDER — FENTANYL CITRATE (PF) 100 MCG/2ML IJ SOLN
INTRAMUSCULAR | Status: AC
  Filled 2015-08-07: qty 2

## 2015-08-07 MED ORDER — ROSUVASTATIN CALCIUM 10 MG PO TABS: 5.0000 mg | ORAL_TABLET | ORAL | Status: DC

## 2015-08-07 MED ORDER — LIDOCAINE HCL (PF) 1 % IJ SOLN
INTRAMUSCULAR | Status: AC
  Filled 2015-08-07: qty 30

## 2015-08-07 MED ORDER — BIVALIRUDIN 250 MG IV SOLR
250.0000 mg | INTRAVENOUS | Status: DC | PRN
  Administered 2015-08-07: 1.75 mg/kg/h via INTRAVENOUS

## 2015-08-07 MED ORDER — VERAPAMIL HCL 2.5 MG/ML IV SOLN
INTRAVENOUS | Status: DC | PRN
  Administered 2015-08-07: 10 mL via INTRA_ARTERIAL

## 2015-08-07 MED ORDER — SODIUM CHLORIDE 0.9% FLUSH: 3.0000 mL | Freq: Two times a day (BID) | INTRAVENOUS | Status: DC

## 2015-08-07 MED ORDER — BIVALIRUDIN BOLUS VIA INFUSION - CUPID
INTRAVENOUS | Status: DC | PRN
  Administered 2015-08-07: 59.55 mg via INTRAVENOUS

## 2015-08-07 SURGICAL SUPPLY — 21 items
BALLN EUPHORA RX 2.5X12 (BALLOONS) ×2
BALLN ~~LOC~~ EUPHORA RX 3.25X8 (BALLOONS) ×2
BALLOON EUPHORA RX 2.5X12 (BALLOONS) IMPLANT
BALLOON ~~LOC~~ EUPHORA RX 3.25X8 (BALLOONS) IMPLANT
CATH INFINITI 5 FR 3DRC (CATHETERS) ×1 IMPLANT
CATH INFINITI 5 FR JL3.5 (CATHETERS) ×1 IMPLANT
CATH INFINITI 5FR AL1 (CATHETERS) ×1 IMPLANT
CATH INFINITI 5FR ANG PIGTAIL (CATHETERS) ×1 IMPLANT
CATH INFINITI JR4 5F (CATHETERS) ×1 IMPLANT
DEVICE RAD COMP TR BAND LRG (VASCULAR PRODUCTS) ×1 IMPLANT
GLIDESHEATH SLEND SS 6F .021 (SHEATH) ×1 IMPLANT
GUIDE CATH RUNWAY 6FR CLS3.5 (CATHETERS) ×1 IMPLANT
KIT ENCORE 26 ADVANTAGE (KITS) ×1 IMPLANT
KIT HEART LEFT (KITS) ×2 IMPLANT
PACK CARDIAC CATHETERIZATION (CUSTOM PROCEDURE TRAY) ×2 IMPLANT
STENT PROMUS PREM MR 3.0X12 (Permanent Stent) ×1 IMPLANT
SYR MEDRAD MARK V 150ML (SYRINGE) ×2 IMPLANT
TRANSDUCER W/STOPCOCK (MISCELLANEOUS) ×2 IMPLANT
TUBING CIL FLEX 10 FLL-RA (TUBING) ×2 IMPLANT
WIRE ASAHI PROWATER 180CM (WIRE) ×1 IMPLANT
WIRE SAFE-T 1.5MM-J .035X260CM (WIRE) ×1 IMPLANT

## 2015-08-07 NOTE — Progress Notes (Signed)
TR BAND REMOVAL  LOCATION:    Right radial   DEFLATED PER PROTOCOL:    Yes.    TIME BAND OFF / DRESSING APPLIED:    1220p  SITE UPON ARRIVAL:    Level 0  SITE AFTER BAND REMOVAL:    Level 0  CIRCULATION SENSATION AND MOVEMENT:    Within Normal Limits   Yes.    COMMENTS:   No problem at site,  2+ radial pulse

## 2015-08-07 NOTE — Interval H&P Note (Signed)
History and Physical Interval Note:  08/07/2015 6:36 AM  [name]  has presented today for surgery, with the diagnosis of shortness of breath - abnormal stress test  The various methods of treatment have been discussed with the patient and family. After consideration of risks, benefits and other options for treatment, the patient has consented to  Procedure(s): Left Heart Cath and Coronary Angiography (N/A) as a surgical intervention .  The patient's history has been reviewed, patient examined, no change in status, stable for surgery.  I have reviewed the patient's chart and labs.  Questions were answered to the patient's satisfaction.     [employer]

## 2015-08-07 NOTE — H&P (View-Only) (Signed)
[name]. [address] [place], [place]  [address] Phone: [phone] Fax:  [phone]  Date:  07/23/2015   ID:  [name], DOB 29-Dec-1950, MRN [id number]  PCP:   Melinda Crutch, MD   History of Present Illness: [name] is a 65 y.o. male with prior cardiac catheterization in October of 2009 following mildly positive troponin (isolated blood draw), mild non-ST elevation MI which revealed 60% right coronary artery stenosis. A nuclear stress test placed following this to determine if the lesion was flow limiting and stress test overall was reassuring at the time. He exercised for 9 minutes.  However, at prior visit, he had ER, chest pain eval. NUC stress this time 06/2015: Moderate size and intensity fixed apical and inferolateral perfusion defect, likely scar. No reversible ischemia. LVEF 41% with inferolateral akinesis. Submaximal exercise, study converted to lexiscan due to DOE. This is an intermediate risk study.  We discussed implications of the stress test, potential changes in his right coronary artery anatomy. In wishes to pursue cardiac catheterization.  Previously, had right carotid endarterectomy, amaurosis fugax. He was upset about the increase in cost of vascular ultrasound.  He is had issues in the past tolerating statin therapy, Crestor included. He is currently taking Pralulent. 50% reduction in LDL. Even with approval from his insurance company the medication was $1000 a month. He is unable to take this.  Noting more dyspnea on exertion, funny feeling in chest. Getting worse. Dr. Harrington Challenger wanted him to see me. ER on Christmas 2016. OK workup. Worried about dying in yard.  He still enjoys playing golf quite frequently. Left hip bursitis, Dr. Paralee Cancel performed left hip soft tissue injections. He has had MRI. He states it is hard getting old.  [name] wife here with him.   Wt Readings from Last 3 Encounters:  07/23/15 178 lb (80.74 kg)  07/06/15 172 lb (78.019  kg)  06/30/15 172 lb (78.019 kg)     Past Medical History  Diagnosis Date  . Carotid artery occlusion   . Arthritis   . Heart attack [facility]) Oct. 2009    Mild  . Hyperlipidemia   . Coronary atherosclerosis of native coronary artery 01/29/2013    Past Surgical History  Procedure Laterality Date  . Spine surgery    . Carotid endarterectomy  01/05/2006    Right  CEA with DPA    Current Outpatient Prescriptions  Medication Sig Dispense Refill  . acetaminophen-codeine (TYLENOL #3) 300-30 MG tablet Take 1 tablet by mouth every 6 (six) hours as needed. For pain  0  . amLODipine (NORVASC) 5 MG tablet Take 1 tablet by mouth daily.  0  . aspirin 81 MG tablet Take 81 mg by mouth daily.    . cholecalciferol (VITAMIN D) 1000 UNITS tablet Take 1 tablet (1,000 Units total) by mouth daily.    . cyclobenzaprine (FLEXERIL) 10 MG tablet Take 1 tablet (10 mg total) by mouth 3 (three) times daily as needed for muscle spasms. 15 tablet 0  . diclofenac (VOLTAREN) 75 MG EC tablet Take 1 tablet by mouth 2 (two) times daily.  2  . diclofenac sodium (VOLTAREN) 1 % GEL Apply 1 application topically 2 (two) times daily as needed.  0  . Glucosamine-Chondroit-Vit C-Mn (GLUCOSAMINE 1500 COMPLEX PO) Take 1 tablet by mouth daily.     Marland Kitchen lisinopril (PRINIVIL,ZESTRIL) 10 MG tablet TAKE 1 TABLET BY MOUTH ONCE DAILY. 90 tablet 3  . meloxicam (MOBIC) 7.5  MG tablet Take 7.5 mg by mouth daily as needed for pain.     . metoprolol succinate (TOPROL-XL) 25 MG 24 hr tablet TAKE 1 TABLET BY MOUTH ONCE DAILY. 90 tablet 3  . OVER THE COUNTER MEDICATION Take 30 mLs by mouth at bedtime as needed (sleep). Z Quil- for sleep    . Phenyleph-CPM-DM-APAP (ALKA-SELTZER PLUS COLD & FLU PO) Take 2 tablets by mouth every 6 (six) hours as needed. For cold symptoms    . rosuvastatin (CRESTOR) 5 MG tablet Take 1 tablet (5 mg total) by mouth daily. 30 tablet 3  . traMADol (ULTRAM) 50 MG tablet Take 50 mg by mouth every 6 (six) hours as needed for  moderate pain.      No current facility-administered medications for this visit.    Allergies:    Allergies  Allergen Reactions  . Statins     Failed Crestor 5 mg twice weekly, Crestor 20 mg daily, Pravastatin 40 mg qd, Lipitor, Zocor - muscle aches  . Zetia [Ezetimibe]     Muscle aches    Social History:  The patient  reports that he quit smoking about 10 years ago. His smoking use included Cigarettes. His smokeless tobacco use includes Snuff. He reports that he drinks alcohol. He reports that he does not use illicit drugs.   ROS:  Please see the history of present illness.   Denies any fevers, chills, syncope, orthopnea, PND  PHYSICAL EXAM: VS:  BP 116/78 mmHg  Pulse 64  Ht 5\' 6"  (1.676 m)  Wt 178 lb (80.74 kg)  BMI 28.74 kg/m2 Well nourished, well developed, in no acute distress HEENT: normal Neck: no JVD right carotid endarterectomy scar noted Cardiac:  normal S1, S2; RRR; no murmur Lungs:  clear to auscultation bilaterally, no wheezing, rhonchi or rales Abd: soft, nontender, no hepatomegaly Ext: no edema2+ radial pulse Skin: warm and dry Neuro: no focal abnormalities noted  EKG:   02/03/15-sinus rhythm, 64, nonspecific T-wave changes. 01/29/14-normal sinus rhythm, 64, nonspecific T-wave changes, flattening. Prior 01/29/13: Sinus rhythm rate 68 with nonspecific T-wave changes.     NUC stress test 07/06/15: Moderate size and intensity fixed apical and inferolateral perfusion defect, likely scar. No reversible ischemia. LVEF 41% with inferolateral akinesis. Submaximal exercise, study converted to lexiscan due to DOE. This is an intermediate risk study.   ASSESSMENT AND PLAN:  Coronary artery disease -Since he is having more exertional dyspnea symptoms and some occasional chest discomfort with exertion, and abnormal nuclear stress test as above with EF 41%, fixed inferolateral perfusion defect, we will proceed with cardiac catheterization. Risks and benefits include stroke,  heart attack, death were discussed. Radial artery approach. -previously described as nonobstructive 60% RCA lesion with reassuring nuclear stress test showing no evidence of ischemia in that territory previously. Now there is a fixed inferolateral defect, EF 41%. Continuing with secondary prevention efforts. Unable to tolerate statin therapy. We have had lengthy discussions about this in the past. Had lengthy discussion about cost of healthcare, cost of medications, studies, at prior visit.  Peripheral vascular disease/carotid artery disease -prior right sided carotid endarterectomy.  Hypertension -  Multidrug regimen. Elevated today, previously well controlled. Continuing to work with both me and Dr. Harrington Challenger. Continue current medications  Hyperlipidemia -Praulent has been re-approved.  However $1000 a month is too much for medical therapy for him. He is not taking. He is trying to take the Crestor a few times a week. Continue to encourage.  2 week post catheterization follow-up  Signed, Candee Furbish, MD [facility]  07/23/2015 10:21 AM

## 2015-08-08 ENCOUNTER — Other Ambulatory Visit: Payer: Self-pay | Admitting: *Deleted

## 2015-08-08 DIAGNOSIS — I2510 Atherosclerotic heart disease of native coronary artery without angina pectoris: Secondary | ICD-10-CM | POA: Diagnosis not present

## 2015-08-08 DIAGNOSIS — R931 Abnormal findings on diagnostic imaging of heart and coronary circulation: Secondary | ICD-10-CM | POA: Diagnosis present

## 2015-08-08 DIAGNOSIS — I6529 Occlusion and stenosis of unspecified carotid artery: Secondary | ICD-10-CM

## 2015-08-08 DIAGNOSIS — I10 Essential (primary) hypertension: Secondary | ICD-10-CM | POA: Diagnosis not present

## 2015-08-08 DIAGNOSIS — E785 Hyperlipidemia, unspecified: Secondary | ICD-10-CM | POA: Diagnosis not present

## 2015-08-08 DIAGNOSIS — I214 Non-ST elevation (NSTEMI) myocardial infarction: Secondary | ICD-10-CM | POA: Diagnosis not present

## 2015-08-08 DIAGNOSIS — I25118 Atherosclerotic heart disease of native coronary artery with other forms of angina pectoris: Secondary | ICD-10-CM

## 2015-08-08 DIAGNOSIS — Z9861 Coronary angioplasty status: Secondary | ICD-10-CM

## 2015-08-08 HISTORY — DX: Abnormal findings on diagnostic imaging of heart and coronary circulation: R93.1

## 2015-08-08 LAB — CBC
HCT: 45.8 % (ref 39.0–52.0)
Hemoglobin: 15 g/dL (ref 13.0–17.0)
MCH: 30.7 pg (ref 26.0–34.0)
MCHC: 32.8 g/dL (ref 30.0–36.0)
MCV: 93.9 fL (ref 78.0–100.0)
PLATELETS: 212 10*3/uL (ref 150–400)
RBC: 4.88 MIL/uL (ref 4.22–5.81)
RDW: 13 % (ref 11.5–15.5)
WBC: 5.8 10*3/uL (ref 4.0–10.5)

## 2015-08-08 LAB — BASIC METABOLIC PANEL
Anion gap: 9 (ref 5–15)
BUN: 13 mg/dL (ref 6–20)
CO2: 23 mmol/L (ref 22–32)
CREATININE: 0.88 mg/dL (ref 0.61–1.24)
Calcium: 9.4 mg/dL (ref 8.9–10.3)
Chloride: 104 mmol/L (ref 101–111)
GFR calc Af Amer: >60 mL/min (ref >60)
GLUCOSE: 103 mg/dL — AB (ref 65–99)
Potassium: 4.4 mmol/L (ref 3.5–5.1)
Sodium: 136 mmol/L (ref 135–145)

## 2015-08-08 MED ORDER — ANGIOPLASTY BOOK
Freq: Once | Status: DC
  Filled 2015-08-08: qty 1

## 2015-08-08 MED ORDER — TICAGRELOR 90 MG PO TABS: 90.0000 mg | ORAL_TABLET | Freq: Two times a day (BID) | ORAL | Status: DC

## 2015-08-08 MED ORDER — ROSUVASTATIN CALCIUM 5 MG PO TABS: 5.0000 mg | ORAL_TABLET | ORAL | Status: DC

## 2015-08-08 MED ORDER — NITROGLYCERIN 0.4 MG SL SUBL: 0.4000 mg | SUBLINGUAL_TABLET | SUBLINGUAL | Status: AC | PRN

## 2015-08-08 MED ORDER — AMBULATORY NON FORMULARY MEDICATION: 81.0000 mg | Freq: Every day | Status: DC

## 2015-08-08 MED ORDER — AMBULATORY NON FORMULARY MEDICATION: 90.0000 mg | Freq: Two times a day (BID) | Status: DC

## 2015-08-08 NOTE — Discharge Summary (Signed)
Discharge Summary    Patient ID: [name],  MRN: [id number], DOB/AGE: 1950/06/09 65 y.o.  Admit date: 08/07/2015 Discharge date: 08/08/2015  Primary Care Provider:  Melinda Crutch Primary Cardiologist: Dr Marlou Porch  Discharge Diagnoses    Principal Problem:   Abnormal nuclear cardiac imaging test Active Problems:   H/O fatigue with exertion   CAD S/P CFX DES 08/07/15   Essential hypertension   Carotid stenosis-s/p RCE   Hyperlipidemia   Allergies Allergies  Allergen Reactions  . Statins Other (See Comments)    Failed Crestor 5 mg twice weekly, Crestor 20 mg daily, Pravastatin 40 mg qd, Lipitor, Zocor - muscle aches  . Zetia [Ezetimibe] Other (See Comments)    Muscle aches    Diagnostic Studies/Procedures    Cath/PCI 08/07/15 _____________   History of Present Illness     64 y/o with exertional fatigue, abnormal Myoview, admitted for cath  Hospital Course      65 y.o. male with prior cardiac catheterization in October of 2009 following mildly positive troponin (isolated blood draw), mild non-ST elevation MI which revealed 60% right coronary artery stenosis. A nuclear stress test placed following this to determine if the lesion was flow limiting and stress test overall was reassuring at the time.  He was seen recently with exertional fatigue concerning for anginal equivalent. He underwent nuclear stress testing 07/06/15 which was abnormal. He was admitted for cath 08/07/15. The pt had a 99% CFX stenosis that was treated with a DES. He also had residual 60% distal RCA. LVF was normal. He tolerated this well and is for discharge 08/08/15. He was enrolled in the [place] study.  _____________  Discharge Vitals Blood pressure 139/86, pulse 74, temperature 97.7 F (36.5 C), temperature source Oral, resp. rate 12, height 5\' 6"  (1.676 m), weight 179 lb 7.3 oz (81.4 kg), SpO2 93 %.  Filed Weights   08/07/15 0557 08/08/15 0429  Weight: 175 lb (79.379 kg) 179 lb 7.3 oz (81.4 kg)   PE CV: RRR without murmur Chest: Clear Extrem: Rt radial site without hematoma Neuro: Intact  Labs & Radiologic Studies    CBC  Recent Labs  08/08/15 0456  WBC 5.8  HGB 15.0  HCT 45.8  MCV 93.9  PLT 99991111   Basic Metabolic Panel  Recent Labs  08/08/15 0456  NA 136  K 4.4  CL 104  CO2 23  GLUCOSE 103*  BUN 13  CREATININE 0.88  CALCIUM 9.4   Liver Function Tests No results for input(s): AST, ALT, ALKPHOS, BILITOT, PROT, ALBUMIN in the last 72 hours. No results for input(s): LIPASE, AMYLASE in the last 72 hours. Cardiac Enzymes No results for input(s): CKTOTAL, CKMB, CKMBINDEX, TROPONINI in the last 72 hours. BNP Invalid input(s): POCBNP D-Dimer No results for input(s): DDIMER in the last 72 hours. Hemoglobin A1C No results for input(s): HGBA1C in the last 72 hours. Fasting Lipid Panel No results for input(s): CHOL, HDL, LDLCALC, TRIG, CHOLHDL, LDLDIRECT in the last 72 hours. Thyroid Function Tests No results for input(s): TSH, T4TOTAL, T3FREE, THYROIDAB in the last 72 hours.  Invalid input(s): FREET3 _____________  No results found. Disposition   Pt is being discharged home today in good condition.  Follow-up Plans & Appointments     Discharge Instructions    AMB Referral to Cardiac Rehabilitation - Phase II    Complete by:  As directed   Diagnosis:  NSTEMI           Discharge Medications  Current Discharge Medication List    START taking these medications   Details  nitroGLYCERIN (NITROSTAT) 0.4 MG SL tablet Place 1 tablet (0.4 mg total) under the tongue every 5 (five) minutes as needed for chest pain. Qty: 25 tablet, Refills: 2    ticagrelor (BRILINTA) 90 MG TABS tablet Take 1 tablet (90 mg total) by mouth 2 (two) times daily. Qty: 60 tablet      CONTINUE these medications which have CHANGED   Details  rosuvastatin (CRESTOR) 5 MG tablet Take 1 tablet (5 mg total) by mouth 2 (two) times a week. Take on Wednesday and Sunday. Qty: 30  tablet, Refills: 3      CONTINUE these medications which have NOT CHANGED   Details  amLODipine (NORVASC) 5 MG tablet Take 5 mg by mouth daily.  Refills: 0    aspirin EC 81 MG tablet Take 81 mg by mouth daily.    cholecalciferol (VITAMIN D) 1000 UNITS tablet Take 1 tablet (1,000 Units total) by mouth daily.    lisinopril (PRINIVIL,ZESTRIL) 10 MG tablet TAKE 1 TABLET BY MOUTH ONCE DAILY. Qty: 90 tablet, Refills: 3    metoprolol succinate (TOPROL-XL) 25 MG 24 hr tablet TAKE 1 TABLET BY MOUTH ONCE DAILY. Qty: 90 tablet, Refills: 3    traMADol (ULTRAM) 50 MG tablet Take 50 mg by mouth every 6 (six) hours as needed for moderate pain.     acetaminophen-codeine (TYLENOL #3) 300-30 MG tablet Take 1 tablet by mouth every 6 (six) hours as needed. For pain Refills: 0    cyclobenzaprine (FLEXERIL) 10 MG tablet Take 1 tablet (10 mg total) by mouth 3 (three) times daily as needed for muscle spasms. Qty: 15 tablet, Refills: 0    diclofenac sodium (VOLTAREN) 1 % GEL Apply 1 application topically 2 (two) times daily as needed (For pain.).  Refills: 0    DiphenhydrAMINE HCl (ZZZQUIL) 50 MG/30ML LIQD Take 30 mLs by mouth at bedtime as needed (For sleep.).    Glucosamine-Chondroit-Vit C-Mn (GLUCOSAMINE 1500 COMPLEX PO) Take 1 tablet by mouth daily.       STOP taking these medications     meloxicam (MOBIC) 7.5 MG tablet      diclofenac (VOLTAREN) 75 MG EC tablet          Aspirin prescribed at discharge?  Yes High Intensity Statin Prescribed? (Lipitor 40-80mg  or Crestor 20-40mg ): No: Statin intol Beta Blocker Prescribed? Yes For EF <40%, was ACEI/ARB Prescribed? Yes ADP Receptor Inhibitor Prescribed? (i.e. Plavix etc.-Includes Medically Managed Patients): Yes For EF <40%, Aldosterone Inhibitor Prescribed? No: NA Was EF assessed during THIS hospitalization? Yes Was Cardiac Rehab II ordered? (Included Medically managed Patients): Yes   Outstanding Labs/Studies    Duration of Discharge  Encounter   Greater than 30 minutes including physician time.  Angelena Form  PA 08/08/2015, 8:22 AM

## 2015-08-08 NOTE — Research (Signed)
TWILIGHT Informed Consent   Subject Name: [name]  Subject met inclusion and exclusion criteria.  The informed consent form, study requirements and expectations were reviewed with the subject and questions and concerns were addressed prior to the signing of the consent form.  The subject verbalized understanding of the trail requirements.  The subject agreed to participate in the TWILIGHT trial and signed the informed consent.  The informed consent was obtained prior to performance of any protocol-specific procedures for the subject.  A copy of the signed informed consent was given to the subject and a copy was placed in the subject's medical record.  [name] 08/08/2015, 11:54 AM

## 2015-08-08 NOTE — Research (Signed)
Study drug was dispensed to patient to include Ticagrelor and aspirin. Patient and family were given instructions. Patient verbalized understanding.

## 2015-08-08 NOTE — Progress Notes (Signed)
Pt ambulated without problems. Ed completed with pt and wife. Understands importance of Brilinta/ASA although there was some confusion [id number] study he was going to be on. It is cleared up now. Will send referral to [facility]. He is exercising at home and working on diet. [place] CES, ACSM 9:51 AM 08/08/2015

## 2015-08-08 NOTE — Discharge Instructions (Addendum)
° °  STOP TAKING THE FOLLOWING MEDICATIONS:  Berry (MELOXICAN)

## 2015-08-08 NOTE — Progress Notes (Signed)
   SUBJECTIVE:  Denies any chest pain  OBJECTIVE:   Vitals:   Filed Vitals:   08/07/15 1701 08/07/15 1924 08/08/15 0429 08/08/15 0730  BP: 173/84 140/83 132/85 139/86  Pulse: 60 65 62 74  Temp: 97.7 F (36.5 C) 97.6 F (36.4 C) 97.9 F (36.6 C) 97.7 F (36.5 C)  TempSrc: Oral Oral Oral Oral  Resp: 16 15 15 12   Height:      Weight:   179 lb 7.3 oz (81.4 kg)   SpO2: 97% 95% 95% 93%   I&O's:   Intake/Output Summary (Last 24 hours) at 08/08/15 [id number] Last data filed at 08/08/15 0732  Gross per 24 hour  Intake    720 ml  Output   3250 ml  Net  -2530 ml   TELEMETRY: Reviewed telemetry pt in NSR:     PHYSICAL EXAM General: Well developed, well nourished, in no acute distress Head: Eyes PERRLA, No xanthomas.   Normal cephalic and atramatic  Lungs:   Clear bilaterally to auscultation and percussion. Heart:   HRRR S1 S2 Pulses are 2+ & equal. Abdomen: Bowel sounds are positive, abdomen soft and non-tender without masses Msk:  Back normal, normal gait. Normal strength and tone for age. Extremities:   No clubbing, cyanosis or edema.  DP +1 Neuro: Alert and oriented X 3. Psych:  Good affect, responds appropriately   LABS: Basic Metabolic Panel:  Recent Labs  08/08/15 0456  NA 136  K 4.4  CL 104  CO2 23  GLUCOSE 103*  BUN 13  CREATININE 0.88  CALCIUM 9.4   Liver Function Tests: No results for input(s): AST, ALT, ALKPHOS, BILITOT, PROT, ALBUMIN in the last 72 hours. No results for input(s): LIPASE, AMYLASE in the last 72 hours. CBC:  Recent Labs  08/08/15 0456  WBC 5.8  HGB 15.0  HCT 45.8  MCV 93.9  PLT 212   Cardiac Enzymes: No results for input(s): CKTOTAL, CKMB, CKMBINDEX, TROPONINI in the last 72 hours. BNP: Invalid input(s): POCBNP D-Dimer: No results for input(s): DDIMER in the last 72 hours. Hemoglobin A1C: No results for input(s): HGBA1C in the last 72 hours. Fasting Lipid Panel: No results for input(s): CHOL, HDL, LDLCALC, TRIG, CHOLHDL,  LDLDIRECT in the last 72 hours. Thyroid Function Tests: No results for input(s): TSH, T4TOTAL, T3FREE, THYROIDAB in the last 72 hours.  Invalid input(s): FREET3 Anemia Panel: No results for input(s): VITAMINB12, FOLATE, FERRITIN, TIBC, IRON, RETICCTPCT in the last 72 hours. Coag Panel:   Lab Results  Component Value Date   INR 0.91 07/23/2015   INR 1.0 12/05/2007    RADIOLOGY: No results found.   ASSESSMENT AND PLAN:  Coronary artery disease with history of NSTEMI 2009 with 60% RCA -patient had recent chest discomfort with exertion, and abnormal nuclear stress test as above with EF 41%, fixed inferolateral perfusion defect - cath showed 99% LCx lesion and residual distal RCA of 60%.  He underwent DES to LCx and is on DAPT and enrolled in Twilight study.  Peripheral vascular disease/carotid artery disease -prior right sided carotid endarterectomy.  Continue ASA and statin.  Hypertension -Well controlled.  Continue current medications of amlodipine/BB and ACE I.   Hyperlipidemia -Praulent has been re-approved. However $1000 a month is too much for medical therapy for him. He is no on Crestor. Repeat FLP at next OV.   Fransico Him, MD  08/08/2015  8:28 AM

## 2015-08-20 ENCOUNTER — Ambulatory Visit: Payer: PPO | Admitting: Family

## 2015-08-20 ENCOUNTER — Encounter (HOSPITAL_COMMUNITY): Payer: PPO

## 2015-08-24 ENCOUNTER — Encounter: Payer: Self-pay | Admitting: Cardiology

## 2015-08-24 ENCOUNTER — Ambulatory Visit (INDEPENDENT_AMBULATORY_CARE_PROVIDER_SITE_OTHER): Payer: PPO | Admitting: Cardiology

## 2015-08-24 VITALS — BP 134/88 | HR 74 | Ht 66.0 in | Wt 181.8 lb

## 2015-08-24 DIAGNOSIS — I2510 Atherosclerotic heart disease of native coronary artery without angina pectoris: Secondary | ICD-10-CM

## 2015-08-24 DIAGNOSIS — E785 Hyperlipidemia, unspecified: Secondary | ICD-10-CM | POA: Diagnosis not present

## 2015-08-24 DIAGNOSIS — I6529 Occlusion and stenosis of unspecified carotid artery: Secondary | ICD-10-CM | POA: Diagnosis not present

## 2015-08-24 DIAGNOSIS — R0609 Other forms of dyspnea: Secondary | ICD-10-CM

## 2015-08-24 DIAGNOSIS — R0600 Dyspnea, unspecified: Secondary | ICD-10-CM

## 2015-08-24 MED ORDER — CLOPIDOGREL BISULFATE 75 MG PO TABS: 75.0000 mg | ORAL_TABLET | Freq: Every day | ORAL | Status: DC

## 2015-08-24 NOTE — Patient Instructions (Signed)
Medication Instructions:  Please stop your Brilinta. Start Plavix 75 mg.  Take (4) tablets the first day then (1) tablet a day thereafter. Continue all other medications as listed.  Follow-Up: Follow up in 4 months with Dr Marlou Porch.  If you need a refill on your cardiac medications before your next appointment, please call your pharmacy.  Thank you for choosing [facility]!!

## 2015-08-24 NOTE — Progress Notes (Signed)
[facility]. [address], [place]  [address] Phone: [phone] Fax:  [phone]  Date:  08/24/2015   ID:  [name], DOB 1950-09-29, MRN [id number]  PCP:   Melinda Crutch, MD   History of Present Illness: [name] is a 66 y.o. male with prior cardiac catheterization in October of 2009 following mildly positive troponin (isolated blood draw), mild non-ST elevation MI which revealed 60% right coronary artery stenosis.  However, at prior visit, he had ER, chest pain eval. NUC stress this time 06/2015: Moderate size and intensity fixed apical and inferolateral perfusion defect, likely scar. No reversible ischemia. LVEF 41% with inferolateral akinesis. Submaximal exercise, study converted to lexiscan due to DOE. This is an intermediate risk study.  We discussed implications of the stress test, potential changes in his right coronary artery anatomy. In wishes to pursue cardiac catheterization.  He therefore went to hospital and had cardiac catheterization performed which demonstrated a 99% circumflex stenosis successfully treated with DES. He also had residual 60% distal RCA. EF was normal. Cardiac catheterization took place on 08/07/15.  After probably 7 days of feeling great after stent, he began to feel more short of breath, flushed, feeling overweight. He attributes this to his Brilinta.  Previously, had right carotid endarterectomy, amaurosis fugax. He was upset about the increase in cost of vascular ultrasound.  He is had issues in the past tolerating statin therapy, Crestor included. He is currently taking Pralulent. 50% reduction in LDL. Even with approval from his insurance company the medication was $1000 a month. He is unable to take this.  Noting more dyspnea on exertion, funny feeling in chest. Getting worse. Dr. Harrington Challenger wanted him to see me. ER on Christmas 2016. OK workup. Worried about dying in yard.  He still enjoys playing golf quite frequently. Left hip  bursitis, Dr. Paralee Cancel performed left hip soft tissue injections. He has had MRI. He states it is hard getting old.  [name] wife here with him.   Wt Readings from Last 3 Encounters:  08/24/15 181 lb 12.8 oz (82.464 kg)  08/08/15 179 lb 7.3 oz (81.4 kg)  07/23/15 178 lb (80.74 kg)     Past Medical History  Diagnosis Date  . Carotid artery occlusion   . Arthritis   . Heart attack [facility]) Oct. 2009    Mild  . Hyperlipidemia   . Coronary atherosclerosis of native coronary artery 01/29/2013  . Hypertension   . Shortness of breath dyspnea     Past Surgical History  Procedure Laterality Date  . Spine surgery    . Carotid endarterectomy  01/05/2006    Right  CEA with DPA  . Cardiac catheterization N/A 08/07/2015    Procedure: Left Heart Cath and Coronary Angiography;  Surgeon: [name], MD;  Location: [facility];  Service: Cardiovascular;  Laterality: N/A;  . Cardiac catheterization N/A 08/07/2015    Procedure: Coronary Stent Intervention;  Surgeon: [name], MD;  Location: [facility];  Service: Cardiovascular;  Laterality: N/A;  . Cardiac catheterization N/A 08/07/2015    Procedure: Coronary Stent Intervention;  Surgeon: [name], MD;  Location: [facility];  Service: Cardiovascular;  Laterality: N/A;  . Coronary stent placement  08/07/2015    MID CIRCUMFLEX  . Appendectomy      Current Outpatient Prescriptions  Medication Sig Dispense Refill  . acetaminophen-codeine (TYLENOL #3) 300-30 MG tablet Take 1 tablet by mouth  every 6 (six) hours as needed. For pain  0  . AMBULATORY NON FORMULARY MEDICATION Take 81 mg by mouth daily. Medication Name: Aspirin (TWILIGHT study-research provides)do not fill 210 tablet do not fill  . AMBULATORY NON FORMULARY MEDICATION Take 90 mg by mouth 2 (two) times daily. Medication Name: Ticagrelor (TWILIGHT STUDY research provides) do not fill 210 tablet do not fill  . amLODipine (NORVASC) 5 MG tablet Take 5 mg by mouth  daily.   0  . cholecalciferol (VITAMIN D) 1000 UNITS tablet Take 1 tablet (1,000 Units total) by mouth daily.    . cyclobenzaprine (FLEXERIL) 10 MG tablet Take 1 tablet (10 mg total) by mouth 3 (three) times daily as needed for muscle spasms. 15 tablet 0  . diclofenac sodium (VOLTAREN) 1 % GEL Apply 1 application topically 2 (two) times daily as needed (For pain.).   0  . DiphenhydrAMINE HCl (ZZZQUIL) 50 MG/30ML LIQD Take 30 mLs by mouth at bedtime as needed (For sleep.).    Marland Kitchen lisinopril (PRINIVIL,ZESTRIL) 10 MG tablet TAKE 1 TABLET BY MOUTH ONCE DAILY. 90 tablet 3  . metoprolol succinate (TOPROL-XL) 25 MG 24 hr tablet TAKE 1 TABLET BY MOUTH ONCE DAILY. 90 tablet 3  . nitroGLYCERIN (NITROSTAT) 0.4 MG SL tablet Place 1 tablet (0.4 mg total) under the tongue every 5 (five) minutes as needed for chest pain. 25 tablet 2  . rosuvastatin (CRESTOR) 5 MG tablet Take 1 tablet (5 mg total) by mouth 2 (two) times a week. Take on Wednesday and Sunday. 30 tablet 3  . traMADol (ULTRAM) 50 MG tablet Take 50 mg by mouth every 6 (six) hours as needed for moderate pain.     Marland Kitchen clopidogrel (PLAVIX) 75 MG tablet Take 1 tablet (75 mg total) by mouth daily. Take 300 mg for 1 day then 75 mg a day thereafter 34 tablet 6   No current facility-administered medications for this visit.    Allergies:    Allergies  Allergen Reactions  . Brilinta [Ticagrelor]     Shortness of breath  . Statins Other (See Comments)    Failed Crestor 5 mg twice weekly, Crestor 20 mg daily, Pravastatin 40 mg qd, Lipitor, Zocor - muscle aches  . Zetia [Ezetimibe] Other (See Comments)    Muscle aches    Social History:  The patient  reports that he quit smoking about 10 years ago. His smoking use included Cigarettes. His smokeless tobacco use includes Snuff. He reports that he drinks alcohol. He reports that he does not use illicit drugs.   ROS:  Please see the history of present illness.   Denies any fevers, chills, syncope, orthopnea,  PND  PHYSICAL EXAM: VS:  BP 134/88 mmHg  Pulse 74  Ht 5\' 6"  (1.676 m)  Wt 181 lb 12.8 oz (82.464 kg)  BMI 29.36 kg/m2 Well nourished, well developed, in no acute distress HEENT: normal Neck: no JVD right carotid endarterectomy scar noted Cardiac:  normal S1, S2; RRR; no murmur Lungs:  clear to auscultation bilaterally, no wheezing, rhonchi or rales Abd: soft, nontender, no hepatomegaly Ext: no edema2+ radial pulse Skin: warm and dry Neuro: no focal abnormalities noted  EKG:   02/03/15-sinus rhythm, 64, nonspecific T-wave changes. 01/29/14-normal sinus rhythm, 64, nonspecific T-wave changes, flattening. Prior 01/29/13: Sinus rhythm rate 68 with nonspecific T-wave changes.     NUC stress test 07/06/15: Moderate size and intensity fixed apical and inferolateral perfusion defect, likely scar. No reversible ischemia. LVEF 41% with inferolateral akinesis. Submaximal exercise,  study converted to [place] due to [place]. This is an intermediate risk study.   Cardiac catheterization however showed a 90% circumflex lesion, 60% RCA, normal ejection fraction.  ASSESSMENT AND PLAN:  Coronary artery disease   - Recent cardiac catheterization June 2017 demonstrated 99% circumflex lesion. After approximate a week he felt great. Then he started to feel shortness of breath with his Brilinta. He also felt as though he put on 150 pounds of weight sensation.  - Changing him from Brilinta over to Plavix. Loading 300 mg one day, 75 mg thereafter.  - He is in twilight study. I asked him to call the study coordinator to let them know the change.  - Nuclear stress test showed 41% EF however cardiac catheterization showed normal EF.Marland Kitchen  Peripheral vascular disease/carotid artery disease -prior right sided carotid endarterectomy.  Hypertension -  Multidrug regimen. Controlled today Continuing to work with both me and Dr. Harrington Challenger. Continue current medications  Hyperlipidemia -Praulent has been re-approved.  However  $1000 a month is too much for medical therapy for him. He is not taking. He is trying to take the Crestor a few times a week. Continue to encourage.  - Still having leg cramps at night. Mustard. Magnesium supplementation. Hydration.  4 month follow-up  Signed, Candee Furbish, MD [facility]  08/24/2015 12:30 PM

## 2015-08-27 DIAGNOSIS — M9904 Segmental and somatic dysfunction of sacral region: Secondary | ICD-10-CM | POA: Diagnosis not present

## 2015-08-27 DIAGNOSIS — M5136 Other intervertebral disc degeneration, lumbar region: Secondary | ICD-10-CM | POA: Diagnosis not present

## 2015-08-27 DIAGNOSIS — M9905 Segmental and somatic dysfunction of pelvic region: Secondary | ICD-10-CM | POA: Diagnosis not present

## 2015-08-27 DIAGNOSIS — M9903 Segmental and somatic dysfunction of lumbar region: Secondary | ICD-10-CM | POA: Diagnosis not present

## 2015-09-03 ENCOUNTER — Telehealth (HOSPITAL_COMMUNITY): Payer: Self-pay | Admitting: Cardiac Rehabilitation

## 2015-09-03 NOTE — Telephone Encounter (Signed)
Cardiac rehab order received.  Phone to pt to discuss enrolling in cardiac rehab.  Pt is not interested in participating at this time.

## 2015-09-08 ENCOUNTER — Other Ambulatory Visit: Payer: Self-pay | Admitting: *Deleted

## 2015-09-08 ENCOUNTER — Telehealth: Payer: Self-pay | Admitting: *Deleted

## 2015-09-08 MED ORDER — ASPIRIN EC 81 MG PO TBEC: 81.0000 mg | DELAYED_RELEASE_TABLET | Freq: Every day | ORAL | Status: DC

## 2015-09-08 NOTE — Telephone Encounter (Signed)
Received a call from [name] regarding his TWILIGHT study drug. He had been taken of the Research study drug due to shortness of breath. Mr. Jacquez was started on Plavix. When reviewing the [facility] it was noticed patient did not have an active order for aspirin. I verified with the patient that he has been taking aspirin. He will return the TWILIGHT provided study drug(Ticagrelor and aspirin) to the research office and continue on plavix and aspirin.

## 2015-09-11 DIAGNOSIS — M7062 Trochanteric bursitis, left hip: Secondary | ICD-10-CM | POA: Diagnosis not present

## 2015-09-11 DIAGNOSIS — M25552 Pain in left hip: Secondary | ICD-10-CM | POA: Diagnosis not present

## 2015-09-15 ENCOUNTER — Encounter: Payer: Self-pay | Admitting: Family Medicine

## 2015-09-15 ENCOUNTER — Ambulatory Visit (INDEPENDENT_AMBULATORY_CARE_PROVIDER_SITE_OTHER): Payer: PPO | Admitting: Family Medicine

## 2015-09-15 VITALS — BP 103/72 | HR 67 | Ht 66.0 in | Wt 178.0 lb

## 2015-09-15 DIAGNOSIS — I2510 Atherosclerotic heart disease of native coronary artery without angina pectoris: Secondary | ICD-10-CM

## 2015-09-15 DIAGNOSIS — I10 Essential (primary) hypertension: Secondary | ICD-10-CM | POA: Diagnosis not present

## 2015-09-15 DIAGNOSIS — Z9861 Coronary angioplasty status: Secondary | ICD-10-CM

## 2015-09-15 DIAGNOSIS — M25552 Pain in left hip: Secondary | ICD-10-CM

## 2015-09-15 DIAGNOSIS — E785 Hyperlipidemia, unspecified: Secondary | ICD-10-CM | POA: Diagnosis not present

## 2015-09-15 DIAGNOSIS — M7062 Trochanteric bursitis, left hip: Secondary | ICD-10-CM | POA: Diagnosis not present

## 2015-09-15 DIAGNOSIS — I6529 Occlusion and stenosis of unspecified carotid artery: Secondary | ICD-10-CM

## 2015-09-15 DIAGNOSIS — G8929 Other chronic pain: Secondary | ICD-10-CM | POA: Insufficient documentation

## 2015-09-15 NOTE — Assessment & Plan Note (Signed)
Strongly encouraged patient to drink half of his weight in ounces of water per day which he rarely drinks any.   Explained to patient this can have a significant impact on decreasing patient's severe muscle cramps and aches when on a statin.

## 2015-09-15 NOTE — Assessment & Plan Note (Signed)
Patient has had repeated injection which I assume are greater today trochanteric bursa injections in the left hip.   He has not had any physical therapy and has not been evaluated for range of motion, muscle weaknesses etc.  additionally, after injections his hip feels better and then he goes to the gym and works out doing various activities. Explained to him that physical therapy eval would be very useful as he could be doing multiple activities to aggravate his pain and cause it.  He told me he would talk to his orthopedist Dr. Alvan Dame about it next visit in approximate 4-6 weeks

## 2015-09-15 NOTE — Assessment & Plan Note (Signed)
Strongly encouraged patient, with the okay and direction of his cardiologist, to start a regular moderate intensity daily aerobic routine to improve his atherosclerotic cardiovascular well- being and vasculopathies.

## 2015-09-15 NOTE — Assessment & Plan Note (Signed)
Under treatment and surveillance by cardiology.

## 2015-09-15 NOTE — Assessment & Plan Note (Signed)
Follows with cardiology regularly, on Plavix.

## 2015-09-15 NOTE — Assessment & Plan Note (Signed)
Well-controlled,  patient asymptomatic.

## 2015-09-15 NOTE — Progress Notes (Signed)
[name], D.O. Primary care at [place]:    Chief Complaint  Patient presents with  . Establish Care   New pt, here to establish care.   HPI: [name] is a pleasant 65 y.o. male who presents to [place] [facility] today To become established. His wife [name] was a patient of mine last week and she highly encouraged him to come by. Next  Prior PCP was Dr. Harrington Green at [place] [address]. Next  Patient seen actively by Dr. Erie Green of [facility] orthopedics for his left hip pain. He had underwent an MRI left hip which was completely normal and goes to him regularly for greater trochanteric bursitis injections is what it sounds like. I do not have those medical records. He never had physical therapy for this  More recently had a second cardiac catheterizatio,n first one was in October 2009. They found a 99% circumflex stenosis and it was treated with DES . He is on Plavix chronically.  No cardiac symptoms.   HLD: Of note he states he did not tolerate various statins because of the severe myalgias and gave him. He was taking the new statin injections which were more than $1200 per month so he could not continue treatments.  He drinks barely any water.    Enjoys golfing which he does 3-4 days per week. He tries to get to the gym occasionally but has been hindered due to his left hip.  Used to walk for 5 miles per day and wishes to get back into that.  He has no other acute complaints today. His last Medicare physical was in October 2016.     Past Medical History  Diagnosis Date  . Carotid artery occlusion   . Arthritis   . Heart attack [facility]) Oct. 2009    Mild  . Hyperlipidemia   . Coronary atherosclerosis of native coronary artery 01/29/2013  . Hypertension   . Shortness of breath dyspnea       Past Surgical History  Procedure Laterality Date  . Spine surgery    . Carotid endarterectomy  01/05/2006    Right   CEA with DPA  . Cardiac catheterization N/A 08/07/2015    Procedure: Left Heart Cath and Coronary Angiography;  Surgeon: [name], MD;  Location: [place] [facility];  Service: Cardiovascular;  Laterality: N/A;  . Cardiac catheterization N/A 08/07/2015    Procedure: Coronary Stent Intervention;  Surgeon: [name], MD;  Location: [place] [facility];  Service: Cardiovascular;  Laterality: N/A;  . Cardiac catheterization N/A 08/07/2015    Procedure: Coronary Stent Intervention;  Surgeon: [name], MD;  Location: [place] [facility];  Service: Cardiovascular;  Laterality: N/A;  . Coronary stent placement  08/07/2015    MID CIRCUMFLEX  . Appendectomy        Family History  Problem Relation Age of Onset  . Heart attack Mother   . Coronary artery disease Mother   . Heart disease Mother     Carotid Stenosis and BPG and Heart Disease before age 26  . Diabetes Mother   . Hypertension Mother   . Heart attack Father   . Heart disease Father     BPG and Heart Disease before age 40  . Cancer Father   . Hypertension Father       History  Drug Use No  ,    History  Alcohol Use  . 0.0 oz/week  . 1-2 Glasses of wine per week  ,    History  Smoking status  . Former Smoker  . Types: Cigarettes  . Quit date: 02/28/2005  Smokeless tobacco  . Current User  . Types: Snuff  ,     History  Sexual Activity  . Sexual Activity: Not on file      Patient's Medications  New Prescriptions   No medications on file  Previous Medications   ACETAMINOPHEN-CODEINE (TYLENOL #3) 300-30 MG TABLET    Take 1 tablet by mouth every 6 (six) hours as needed. For pain   AMLODIPINE (NORVASC) 5 MG TABLET    Take 5 mg by mouth daily.    ASPIRIN EC 81 MG TABLET    Take 1 tablet (81 mg total) by mouth daily.   CHOLECALCIFEROL (VITAMIN D) 1000 UNITS TABLET    Take 1 tablet (1,000 Units total) by mouth daily.   CLOPIDOGREL (PLAVIX) 75 MG TABLET    Take 1 tablet (75 mg total) by mouth daily.  Take 300 mg for 1 day then 75 mg a day thereafter   CYCLOBENZAPRINE (FLEXERIL) 10 MG TABLET    Take 1 tablet (10 mg total) by mouth 3 (three) times daily as needed for muscle spasms.   DICLOFENAC SODIUM (VOLTAREN) 1 % GEL    Apply 1 application topically 2 (two) times daily as needed (For pain.).    LISINOPRIL (PRINIVIL,ZESTRIL) 10 MG TABLET    TAKE 1 TABLET BY MOUTH ONCE DAILY.   METOPROLOL SUCCINATE (TOPROL-XL) 25 MG 24 HR TABLET    TAKE 1 TABLET BY MOUTH ONCE DAILY.   NITROGLYCERIN (NITROSTAT) 0.4 MG SL TABLET    Place 1 tablet (0.4 mg total) under the tongue every 5 (five) minutes as needed for chest pain.   ROSUVASTATIN (CRESTOR) 5 MG TABLET    Take 1 tablet (5 mg total) by mouth 2 (two) times a week. Take on Wednesday and Sunday.   TRAMADOL (ULTRAM) 50 MG TABLET    Take 50 mg by mouth every 6 (six) hours as needed for moderate pain.   Modified Medications   No medications on file  Discontinued Medications   DIPHENHYDRAMINE HCL (ZZZQUIL) 50 MG/30ML LIQD    Take 30 mLs by mouth at bedtime as needed (For sleep.).     Brilinta; Statins; and Zetia Outpatient Encounter Prescriptions as of 09/15/2015  Medication Sig  . acetaminophen-codeine (TYLENOL #3) 300-30 MG tablet Take 1 tablet by mouth every 6 (six) hours as needed. For pain  . amLODipine (NORVASC) 5 MG tablet Take 5 mg by mouth daily.   Marland Kitchen aspirin EC 81 MG tablet Take 1 tablet (81 mg total) by mouth daily.  . cholecalciferol (VITAMIN D) 1000 UNITS tablet Take 1 tablet (1,000 Units total) by mouth daily.  . clopidogrel (PLAVIX) 75 MG tablet Take 1 tablet (75 mg total) by mouth daily. Take 300 mg for 1 day then 75 mg a day thereafter  . cyclobenzaprine (FLEXERIL) 10 MG tablet Take 1 tablet (10 mg total) by mouth 3 (three) times daily as needed for muscle spasms.  . diclofenac sodium (VOLTAREN) 1 % GEL Apply 1 application topically 2 (two) times daily as needed (For pain.).   Marland Kitchen lisinopril (PRINIVIL,ZESTRIL) 10 MG tablet TAKE 1 TABLET BY  MOUTH ONCE DAILY.  . metoprolol succinate (TOPROL-XL) 25 MG 24 hr tablet TAKE 1 TABLET BY MOUTH ONCE DAILY.  . nitroGLYCERIN (NITROSTAT) 0.4 MG SL tablet Place 1 tablet (  0.4 mg total) under the tongue every 5 (five) minutes as needed for chest pain.  . rosuvastatin (CRESTOR) 5 MG tablet Take 1 tablet (5 mg total) by mouth 2 (two) times a week. Take on Wednesday and Sunday.  . traMADol (ULTRAM) 50 MG tablet Take 50 mg by mouth every 6 (six) hours as needed for moderate pain.   . [DISCONTINUED] DiphenhydrAMINE HCl (ZZZQUIL) 50 MG/30ML LIQD Take 30 mLs by mouth at bedtime as needed (For sleep.).   No facility-administered encounter medications on file as of 09/15/2015.    Review of Systems:   ( Completed via Adult Medical History Intake form today ) General:   Denies fever, chills, appetite changes, unexplained weight loss.  Optho/Auditory:   Denies visual changes, blurred vision/LOV, ringing in ears/ diff hearing Respiratory:   Denies SOB, DOE, cough, wheezing.  Cardiovascular:   Denies chest pain, palpitations, new onset peripheral edema  Gastrointestinal:   Denies nausea, vomiting, diarrhea.  Genitourinary:    Denies dysuria, increased frequency, flank pain.  Endocrine:     Denies hot or cold intolerance, polyuria, polydipsia. Musculoskeletal:  Denies unexplained myalgias, joint swelling, arthralgias, gait problems.  Skin:  Denies rash, suspicious lesions or new/ changes in moles Neurological:    Denies dizziness, syncope, unexplained weakness, lightheadedness, numbness  Psychiatric/Behavioral:   Denies mood changes, suicidal or homicidal ideations, hallucinations    Objective:    Body mass index is 28.74 kg/(m^2).  Filed Vitals:   09/15/15 1325  BP: 103/72  Pulse: 67  Blood pressure 103/72, pulse 67, height 5\' 6"  (1.676 m), weight 178 lb (80.74 kg), SpO2 94 %. General: Well Developed, well nourished, and in no acute distress.  Neuro: Alert and oriented x3, extra-ocular muscles  intact, sensation grossly intact.  HEENT: Normocephalic, atraumatic, pupils equal round reactive to light, neck supple, no gross masses, no carotid bruits, no JVD apprec Skin: no gross suspicious lesions or rashes  Cardiac: Regular rate and rhythm, no murmurs rubs or gallops.  Respiratory: Essentially clear to auscultation bilaterally. Not using accessory muscles, speaking in full sentences.  Abdominal: Soft, not grossly distended Musculoskeletal: Ambulates w/o diff, FROM * 4 ext.  Vasc: less 2 sec cap RF, warm and pink  Psych:  No HI/SI, judgement and insight good.    Impression and Recommendations:    The patient was counseled, risk factors were discussed, anticipatory guidance given.  Essential hypertension Well-controlled,  patient asymptomatic.    Hyperlipidemia Strongly encouraged patient to drink half of his weight in ounces of water per day which he rarely drinks any.   Explained to patient this can have a significant impact on decreasing patient's severe muscle cramps and aches when on a statin.  Greater trochanteric bursitis of left hip Patient has had repeated injection which I assume are greater today trochanteric bursa injections in the left hip.   He has not had any physical therapy and has not been evaluated for range of motion, muscle weaknesses etc.  additionally, after injections his hip feels better and then he goes to the gym and works out doing various activities. Explained to him that physical therapy eval would be very useful as he could be doing multiple activities to aggravate his pain and cause it.  He told me he would talk to his orthopedist Dr. Alvan Dame about it next visit in approximate 4-6 weeks  Carotid stenosis-s/p RCE Under treatment and surveillance by cardiology.  CAD S/P CFX DES 08/07/15 Follows with cardiology regularly, on Plavix.  Coronary atherosclerosis of  native coronary artery Strongly encouraged patient, with the okay and direction of his  cardiologist, to start a regular moderate intensity daily aerobic routine to improve his atherosclerotic cardiovascular well- being and vasculopathies.  Gross side effects, risk and benefits, and alternatives of medications discussed with patient.  Patient is aware that all medications have potential side effects and we are unable to predict every side effect or drug-drug interaction that may occur.  Expresses verbal understanding and consents to current therapy plan and treatment regimen.  Please see AVS handed out to patient at the end of our visit for further patient instructions/ counseling done pertaining to today's office visit.      Note: This document was prepared using Dragon voice recognition software and may include unintentional dictation errors.

## 2015-09-15 NOTE — Patient Instructions (Signed)
Walk 34min daily---> start slowly and work your way up.    Guidelines for a Low Cholesterol, Low Saturated Fat Diet   Fats - Limit total intake of fats and oils. - Avoid butter, stick margarine, shortening, lard, palm and coconut oils. - Limit mayonnaise, salad dressings, gravies and sauces, unless they are homemade with low-fat ingredients. - Limit chocolate. - Choose low-fat and nonfat products, such as low-fat mayonnaise, low-fat or non-hydrogenated peanut butter, low-fat or fat-free salad dressings and nonfat gravy. - Use vegetable oil, such as canola or olive oil. - Look for margarine that does not contain trans fatty acids. - Use nuts in moderate amounts. - Read ingredient labels carefully to determine both amount and type of fat present in foods. Limit saturated and trans fats! - Avoid high-fat processed and convenience foods.  Meats and Meat Alternatives - Choose fish, chicken, [place] and lean meats. - Use dried beans, peas, lentils and tofu. - Limit egg yolks to three to four per week. - If you eat red meat, limit to no more than three servings per week and choose loin or round cuts. - Avoid fatty meats, such as bacon, sausage, franks, luncheon meats and ribs. - Avoid all organ meats, including liver.  Dairy - Choose nonfat or low-fat milk, yogurt and cottage cheese. - Most cheeses are high in fat. Choose cheeses made from non-fat milk, such as mozzarella and ricotta cheese. - Choose light or fat-free cream cheese and sour cream. - Avoid cream and sauces made with cream.  Fruits and Vegetables - Eat a wide variety of fruits and vegetables. - Use lemon juice, vinegar or "mist" olive oil on vegetables. - Avoid adding sauces, fat or oil to vegetables.  Breads, Cereals and Grains - Choose whole-grain breads, cereals, pastas and rice. - Avoid high-fat snack foods, such as granola, cookies, pies, pastries, doughnuts and croissants.  Cooking Tips - Avoid deep fried  foods. - Trim visible fat off meats and remove skin from poultry before cooking. - Bake, broil, boil, poach or roast poultry, fish and lean meats. - Drain and discard fat that drains out of meat as you cook it. - Add little or no fat to foods. - Use vegetable oil sprays to grease pans for cooking or baking. - Steam vegetables. - Use herbs or no-oil marinades to flavor foods.       Top Ten Foods for Health  1. Water Drink at least 8 to 12 cups of water daily. Consume half of your body weight in pounds, is the amount of water in ounces to drink daily.  Ie: a 200lb person = 100 oz water daily  2. Dark Green Vegetables Eat dark green vegetables at least three to four times a week. Good options include broccoli, peppers, brussel sprouts and leafy greens like kale and spinach.  3. Whole Grains Whole grains should be included in your diet at least two to three times daily. Look for whole wheat flour, rye, oatmeal, barley, amaranth, quinoa or a multigrain. A good source of fiber includes 3 to 4 grams of fiber per serving. A great source has 5 or more grams of fiber per serving.  4. Beans and Lentils Try to eat a bean-based meal at least once a week. Try to add legumes, including beans and lentils, to soups, stews, casseroles, salads and dips or eat them plain.  5. Fish Try to eat two to three serving of fish a week. A serving consists of 3 to 4 ounces of  cooked fish. Good choices are salmon, trout, herring, bluefish, sardines and tuna.  6. Berries Include two to four servings of fruit in your diet each day. Try to eat berries such as raspberries, blueberries, blackberries and strawberries.  7. Winter Squash Eat butternut and acorn squash as well as other richly pigmented dark orange and green colored vegetables like sweet potato, cantaloupe and mango.  8. Soy 25 grams of soy protein a day is recommended as part of a low-fat diet to help lower cholesterol levels. Try tofu, soymilk,  edamame soybeans, tempeh and texturized vegetable protein (TVP).  9. Flaxseed, Nuts and Seeds Add 1 to 2 tablespoons of ground flaxseed or other seeds to food each day or include a moderate amount of nuts - 1/4 cup - in your daily diet.  10. Organic Yogurt Men and women between 40 and 39 years of age need 1000 milligrams of calcium a day and 1200 milligrams if 27 or older. Eat calcium-rich foods such as nonfat or low-fat dairy products three to four times a day. Include organic choices.

## 2015-09-17 ENCOUNTER — Encounter: Payer: Self-pay | Admitting: Vascular Surgery

## 2015-09-23 ENCOUNTER — Other Ambulatory Visit: Payer: Self-pay | Admitting: *Deleted

## 2015-09-23 DIAGNOSIS — I6523 Occlusion and stenosis of bilateral carotid arteries: Secondary | ICD-10-CM

## 2015-09-28 ENCOUNTER — Ambulatory Visit (INDEPENDENT_AMBULATORY_CARE_PROVIDER_SITE_OTHER): Payer: PPO | Admitting: Family

## 2015-09-28 ENCOUNTER — Ambulatory Visit (HOSPITAL_COMMUNITY)
Admission: RE | Admit: 2015-09-28 | Discharge: 2015-09-28 | Disposition: A | Discharge status: Home / Self Care | Payer: PPO | Source: Ambulatory Visit | Attending: Family | Admitting: Family

## 2015-09-28 ENCOUNTER — Encounter: Payer: Self-pay | Admitting: Family

## 2015-09-28 VITALS — BP 129/84 | HR 62 | Temp 97.4°F | Resp 18 | Ht 66.0 in | Wt 177.5 lb

## 2015-09-28 DIAGNOSIS — I2510 Atherosclerotic heart disease of native coronary artery without angina pectoris: Secondary | ICD-10-CM | POA: Insufficient documentation

## 2015-09-28 DIAGNOSIS — I6523 Occlusion and stenosis of bilateral carotid arteries: Secondary | ICD-10-CM

## 2015-09-28 DIAGNOSIS — I10 Essential (primary) hypertension: Secondary | ICD-10-CM | POA: Diagnosis not present

## 2015-09-28 DIAGNOSIS — Z87891 Personal history of nicotine dependence: Secondary | ICD-10-CM | POA: Diagnosis not present

## 2015-09-28 DIAGNOSIS — Z9889 Other specified postprocedural states: Secondary | ICD-10-CM

## 2015-09-28 DIAGNOSIS — E785 Hyperlipidemia, unspecified: Secondary | ICD-10-CM | POA: Insufficient documentation

## 2015-09-28 DIAGNOSIS — Z720 Tobacco use: Secondary | ICD-10-CM

## 2015-09-28 DIAGNOSIS — Z48812 Encounter for surgical aftercare following surgery on the circulatory system: Secondary | ICD-10-CM

## 2015-09-28 LAB — VAS US CAROTID
LCCADSYS: -73 cm/s
LEFT ECA DIAS: -33 cm/s
LICADDIAS: -24 cm/s
LICAPDIAS: -80 cm/s
Left CCA dist dias: -23 cm/s
Left CCA prox dias: 22 cm/s
Left CCA prox sys: 93 cm/s
Left ICA dist sys: -91 cm/s
Left ICA prox sys: -232 cm/s
RCCADSYS: -64 cm/s
RCCAPDIAS: 11 cm/s
RIGHT CCA MID DIAS: 18 cm/s
RIGHT ECA DIAS: -10 cm/s
Right CCA prox sys: 83 cm/s

## 2015-09-28 NOTE — Progress Notes (Signed)
Chief Complaint: Follow up Extracranial Carotid Artery Stenosis   History of Present Illness  [name] is a 65 y.o. male patient of Dr. Donnetta Hutching who has a history of a right CEA with Dacron patch in 2007.  He returns today for follow up. He had 2 episodes of amaurosis fugax on one eye, he cannot remember which eye, before he had the CEA and has had no further episodes.  The patient denies a hx of unilateral facial drooping, hemiplegia, or receptive or expressive aphasia.  The pain in the lower portion of both feet, worse at night, has improved, alleviated by walking; he states this is being evaluated. He also has a history of lumbar spine surgery for sciatica. He found that his shoes were too small, larger sized shoes and his feet have improved. He sees a Restaurant manager, fast food every 6 weeks.   Pt denies claudication symptoms with walking. He states he has been diagnosed with bursitis in his left hip.  Pt Diabetic: No  Pt smoker: former smoker, quit in 2007, using smokeless tobacco since 2011   Pt meds include:  Statin : No: states he cannot take statins due to severe myalgias, has tried 3-4 different statins , he started, subcut. Injection twice/month, Alirocumab in July, 2015 and LDL decreased from 152 to 70, per pt. He takes 5 mg Crestor once/week  Betablocker: No  ASA: Yes  Other anticoagulants/antiplatelets: Plavix started after cardiac stent placed in June, 2017; prior to this he had some chest pain and dyspnea   Past Medical History:  Diagnosis Date  . Arthritis   . Carotid artery occlusion   . Coronary atherosclerosis of native coronary artery 01/29/2013  . Heart attack [facility]) Oct. 2009   Mild  . Hyperlipidemia   . Hypertension   . Shortness of breath dyspnea     Social History Social History  Substance Use Topics  . Smoking status: Former Smoker    Types: Cigarettes    Quit date: 02/28/2005  . Smokeless tobacco: Current User    Types: Snuff  . Alcohol use 0.0  oz/week    1 - 2 Glasses of wine per week    Family History Family History  Problem Relation Age of Onset  . Heart attack Mother   . Coronary artery disease Mother   . Heart disease Mother     Carotid Stenosis and BPG and Heart Disease before age 20  . Diabetes Mother   . Hypertension Mother   . Heart attack Father   . Heart disease Father     BPG and Heart Disease before age 38  . Cancer Father   . Hypertension Father     Surgical History Past Surgical History:  Procedure Laterality Date  . APPENDECTOMY    . CARDIAC CATHETERIZATION N/A 08/07/2015   Procedure: Left Heart Cath and Coronary Angiography;  Surgeon: [name], MD;  Location: [place] [facility];  Service: Cardiovascular;  Laterality: N/A;  . CARDIAC CATHETERIZATION N/A 08/07/2015   Procedure: Coronary Stent Intervention;  Surgeon: [name], MD;  Location: [place] [facility];  Service: Cardiovascular;  Laterality: N/A;  . CARDIAC CATHETERIZATION N/A 08/07/2015   Procedure: Coronary Stent Intervention;  Surgeon: [name], MD;  Location: [place] [facility];  Service: Cardiovascular;  Laterality: N/A;  . CAROTID ENDARTERECTOMY  01/05/2006   Right  CEA with DPA  . CORONARY STENT PLACEMENT  08/07/2015   MID [place]  . SPINE SURGERY  Allergies  Allergen Reactions  . Brilinta [Ticagrelor]     Shortness of breath  . Statins Other (See Comments)    Failed Crestor 5 mg twice weekly, Crestor 20 mg daily, Pravastatin 40 mg qd, Lipitor, Zocor - muscle aches  . Zetia [Ezetimibe] Other (See Comments)    Muscle aches    Current Outpatient Prescriptions  Medication Sig Dispense Refill  . acetaminophen-codeine (TYLENOL #3) 300-30 MG tablet Take 1 tablet by mouth every 6 (six) hours as needed. For pain  0  . amLODipine (NORVASC) 5 MG tablet Take 5 mg by mouth daily.   0  . aspirin EC 81 MG tablet Take 1 tablet (81 mg total) by mouth daily. 90 tablet 3  . cholecalciferol (VITAMIN D) 1000 UNITS tablet Take 1  tablet (1,000 Units total) by mouth daily.    . clopidogrel (PLAVIX) 75 MG tablet Take 1 tablet (75 mg total) by mouth daily. Take 300 mg for 1 day then 75 mg a day thereafter 34 tablet 6  . cyclobenzaprine (FLEXERIL) 10 MG tablet Take 1 tablet (10 mg total) by mouth 3 (three) times daily as needed for muscle spasms. 15 tablet 0  . diclofenac sodium (VOLTAREN) 1 % GEL Apply 1 application topically 2 (two) times daily as needed (For pain.).   0  . lisinopril (PRINIVIL,ZESTRIL) 10 MG tablet TAKE 1 TABLET BY MOUTH ONCE DAILY. 90 tablet 3  . metoprolol succinate (TOPROL-XL) 25 MG 24 hr tablet TAKE 1 TABLET BY MOUTH ONCE DAILY. 90 tablet 3  . nitroGLYCERIN (NITROSTAT) 0.4 MG SL tablet Place 1 tablet (0.4 mg total) under the tongue every 5 (five) minutes as needed for chest pain. 25 tablet 2  . rosuvastatin (CRESTOR) 5 MG tablet Take 1 tablet (5 mg total) by mouth 2 (two) times a week. Take on Wednesday and Sunday. 30 tablet 3  . traMADol (ULTRAM) 50 MG tablet Take 50 mg by mouth every 6 (six) hours as needed for moderate pain.      No current facility-administered medications for this visit.     Review of Systems : See HPI for pertinent positives and negatives.  Physical Examination  Vitals:   09/28/15 0930 09/28/15 0931  BP: 127/85 129/84  Pulse: 62   Resp: 18   Temp: 97.4 F (36.3 C) 97.4 F (36.3 C)  TempSrc: Oral Oral  SpO2: 96%   Weight: 177 lb 8.6 oz (80.5 kg)   Height: 5\' 6"  (1.676 m)    Body mass index is 28.66 kg/m.  General: WDWN male in NAD  GAIT: normal  Eyes: PERRLA  Pulmonary: CTAB, no rales,  rhonchi, or wheezing.  Cardiac: regular rhythm, no murmur detected.   VASCULAR EXAM  Carotid Bruits  Left  Right    Negative  Negative    Radial pulses: are 2+ palpable and = Aorta: is not palpable  LE Pulses  LEFT  RIGHT   POPLITEAL  not palpable  not palpable   POSTERIOR TIBIAL  palpable  palpable   DORSALIS PEDIS  ANTERIOR TIBIAL  not  palpable  palpable    Gastrointestinal: soft, nontender, BS WNL, no r/g, no masses palpated.  Musculoskeletal: Negative muscle atrophy/wasting. M/S 5/5 throughout, Extremities without ischemic changes.  Neurologic: A&O X 3; Appropriate Affect, Speech is normal  CN 2-12 intact, Pain and light touch intact in extremities, Motor exam as listed above.           Non-Invasive Vascular Imaging CAROTID DUPLEX 09/28/2015   Right ICA: patent  CEA site with no restenosis. Left ICA: 60 - 79 % stenosis. No significant change compared to exam of 02/10/15.   Assessment: KEENAN DELAUDER is a 65 y.o. male who has a history of a right CEA with Dacron patch in 2007.  He had 2 episodes of amaurosis fugax in the same eye preoperatively, no TIA or stroke sx's subsequently.  Today's carotid duplex suggests a patent right CEA site with no restenosis and 60-79% left ICA stenosis.   His atherosclerotic risk factors include current use of smokeless tobacco, former smoker, CAD, and dyslipidemia.  The patient was counseled re tobacco cessation and given several free resources re tobacco cessation.   Plan: Follow-up in 6 months with Carotid Duplex scan.   I discussed in depth with the patient the nature of atherosclerosis, and emphasized the importance of maximal medical management including strict control of blood pressure, blood glucose, and lipid levels, obtaining regular exercise, and cessation of smokeless tobacco use.  The patient is aware that without maximal medical management the underlying atherosclerotic disease process will progress, limiting the benefit of any interventions. The patient was given information about stroke prevention and what symptoms should prompt the patient to seek immediate medical care. Thank you for allowing [place] to participate in this patient's care.  Clemon Chambers, RN, MSN, FNP-C [facility] Office: [facility] Physician:  Trula Slade  09/28/15 10:02 AM

## 2015-09-28 NOTE — Patient Instructions (Addendum)
Stroke Prevention Some health problems and behaviors may make it more likely for you to have a stroke. Below are ways to lessen your risk of having a stroke.   Be active for at least 30 minutes on most or all days.  Do not smoke. Try not to be around others who smoke.  Do not drink too much alcohol.  Do not have more than 2 drinks a day if you are a man.  Do not have more than 1 drink a day if you are a woman and are not pregnant.  Eat healthy foods, such as fruits and vegetables. If you were put on a specific diet, follow the diet as told.  Keep your cholesterol levels under control through diet and medicines. Look for foods that are low in saturated fat, trans fat, cholesterol, and are high in fiber.  If you have diabetes, follow all diet plans and take your medicine as told.  Ask your doctor if you need treatment to lower your blood pressure. If you have high blood pressure (hypertension), follow all diet plans and take your medicine as told by your doctor.  If you are 66 years old, have your blood pressure checked every 3-5 years. If you are age 66 or older, have your blood pressure checked every year.  Keep a healthy weight. Eat foods that are low in calories, salt, saturated fat, trans fat, and cholesterol.  Do not take drugs.  Avoid birth control pills, if this applies. Talk to your doctor about the risks of taking birth control pills.  Talk to your doctor if you have sleep problems (sleep apnea).  Take all medicine as told by your doctor.  You may be told to take aspirin or blood thinner medicine. Take this medicine as told by your doctor.  Understand your medicine instructions.  Make sure any other conditions you have are being taken care of. GET HELP RIGHT AWAY IF:  You suddenly lose feeling (you feel numb) or have weakness in your face, arm, or leg.  Your face or eyelid hangs down to one side.  You suddenly feel confused.  You have trouble talking (aphasia)  or understanding what people are saying.  You suddenly have trouble seeing in one or both eyes.  You suddenly have trouble walking.  You are dizzy.  You lose your balance or your movements are clumsy (uncoordinated).  You suddenly have a very bad headache and you do not know the cause.  You have new chest pain.  Your heart feels like it is fluttering or skipping a beat (irregular heartbeat). Do not wait to see if the symptoms above go away. Get help right away. Call your local emergency services (911 in [place] Do not drive yourself to the hospital.   This information is not intended to replace advice given to you by your health care provider. Make sure you discuss any questions you have with your health care provider.   Document Released: 08/16/2011 Document Revised: 03/07/2014 Document Reviewed: 08/17/2012 Elsevier Interactive Patient Education 2016 [employer].    Smokeless Tobacco Use Smokeless tobacco is a loose, fine, or stringy tobacco. The tobacco is not smoked like a cigarette, but it is chewed or held in the lips or cheeks. It resembles tea and comes from the leaves of the tobacco plant. Smokeless tobacco is usually flavored, sweetened, or processed in some way. Although smokeless tobacco is not smoked into the lungs, its chemicals are absorbed through the membranes in the mouth and into  the bloodstream. Its chemicals are also swallowed in saliva. The chemicals (nicotine and other toxins) are known to cause cancer. Smokeless tobacco contains up to 28 differentcarcinogens. CAUSES Nicotine is addictive. Smokeless tobacco contains nicotine, which is a stimulant. This stimulant can give you a "buzz" or altered state. People can become addicted to the feeling it delivers.  SYMPTOMS Smokeless tobacco can cause health problems, including: Bad breath. Yellow-brown teeth. Mouth sores. Cracking and bleeding lips. Gum disease, gum recession, and bone loss around the teeth. Tooth  decay. Increased or irregular heart rate. High blood pressure, heart disease, and stroke. Cancer of the mouth, lips, tongue, pancreas, voice box (larynx), esophagus, colon, and bladder. Precancerous lesion of the soft tissues of the mouth (leukoplakia). Loss of your sense of taste. TREATMENT Talk with your caregiver about ways you can quit. Quitting tobacco is a good decision for your health. Nicotine is addictive, but several options are available to help you quit including: Nicotine replacement therapy (gum or patch). Support and cessation programs. The following tips can help you quit: Write down the reasons you would like to quit and look at them often. Set a date during a low stress time to stop or cut back. Ask family and friends for their support. Remove all tobacco products from your home and work. Replace the chewing tobacco with things like beef jerky, sunflower seeds, or shredded coconut. Avoid situations that may make you want to chew tobacco. Exercise and eat a healthy diet. When you crave tobacco, distract yourself with drinking water, sugarless chewing gum, sugarless hard candy, exercising, or deep breathing. HOME CARE INSTRUCTIONS See your dentist for regular oral health exams every 6 months. Follow up with your caregiver as recommended. SEEK MEDICAL CARE OR DENTAL CARE IF: You have bleeding or cracking lips, gums, or cheeks. You have mouth sores, discolorations, or pain. You have tooth pain. You develop persistent irritation, burning, or sores in the mouth. You have pain, tenderness, or numbness in the mouth. You develop a lump, bumpy patch, or hardened skin inside the mouth. The color changes inside your mouth (gray, white, or red spots). You have difficulty chewing, swallowing, or speaking.   This information is not intended to replace advice given to you by your health care provider. Make sure you discuss any questions you have with your health care  provider.   Document Released: 07/19/2010 Document Revised: 05/09/2011 Document Reviewed: 07/19/2010 Elsevier Interactive Patient Education [employer].

## 2015-10-15 ENCOUNTER — Ambulatory Visit (INDEPENDENT_AMBULATORY_CARE_PROVIDER_SITE_OTHER): Payer: PPO | Admitting: Family Medicine

## 2015-10-15 ENCOUNTER — Encounter: Payer: Self-pay | Admitting: Family Medicine

## 2015-10-15 VITALS — BP 133/87 | HR 71 | Wt 178.6 lb

## 2015-10-15 DIAGNOSIS — M7062 Trochanteric bursitis, left hip: Secondary | ICD-10-CM | POA: Diagnosis not present

## 2015-10-15 DIAGNOSIS — G8929 Other chronic pain: Secondary | ICD-10-CM | POA: Diagnosis not present

## 2015-10-15 DIAGNOSIS — Z7901 Long term (current) use of anticoagulants: Secondary | ICD-10-CM | POA: Diagnosis not present

## 2015-10-15 DIAGNOSIS — M25552 Pain in left hip: Secondary | ICD-10-CM | POA: Diagnosis not present

## 2015-10-15 NOTE — Patient Instructions (Signed)
Ice 20 min multiple time a day over next 48hrs and after---> always ice muliptle times after golfing/ biking etc.

## 2015-10-15 NOTE — Progress Notes (Signed)
Impression and Recommendations:    1. Greater trochanteric bursitis of left hip   2. Chronic left hip pain   3. Chronic anticoagulation: plavix    Procedure:   -Injection of Greater trochanter bursal sac of left hip -Performed by Dr. Mellody Dance  -Consent obtained after discussion of risks benefits and alternatives of procedure; patient wished to proceed. -Time-out conducted. -Noted no overlying erythema, warmth, induration, or other signs of local infection. -Skin prepped in a sterile fashion. -Topical analgesic spray: Ethyl chloride/ FreezEase. Completed without complication or difficulty. Patient tolerated well. EBL: Less than 1 mL Meds: 3cc 2% lidocaine, 1 mL Depo-Medrol 40 mg per mL Injection site dressed with sterile bandage. -Pain immediately improved by over 50%. suggesting accurate placement of the medication. - Ice 15-20 minutes 3-4 times per day for the next 48 hours and any time after repetitive activity thereafter Advised to call if fevers/chills, erythema, induration, drainage, or bleeding.   Follow-up as discussed  Greater trochanteric bursitis of left hip Ice his hip 1520 minutes 3-4 times per day. Avoid any repetitive activities or exercise over the next 48 hours.  Recommend he discuss with Dr. Alvan Dame to send him to physical therapist, specifically Ronnald Nian, DPT at [facility] orthopedics who specializes in golf therapy which patient does 3-4 times per week.  He told me he would talk to his orthopedist Dr. Alvan Dame about it next visit in approximate 1-2wks  2 wks f/up with Dr Olin-->    Patient's Medications  New Prescriptions   No medications on file  Previous Medications   ACETAMINOPHEN-CODEINE (TYLENOL #3) 300-30 MG TABLET    Take 1 tablet by mouth every 6 (six) hours as needed. For pain   AMLODIPINE (NORVASC) 5 MG TABLET    Take 5 mg by mouth daily.    ASPIRIN EC 81 MG TABLET    Take 1 tablet (81 mg total) by mouth daily.   CHOLECALCIFEROL  (VITAMIN D) 1000 UNITS TABLET    Take 1 tablet (1,000 Units total) by mouth daily.   CLOPIDOGREL (PLAVIX) 75 MG TABLET    Take 1 tablet (75 mg total) by mouth daily. Take 300 mg for 1 day then 75 mg a day thereafter   CYCLOBENZAPRINE (FLEXERIL) 10 MG TABLET    Take 1 tablet (10 mg total) by mouth 3 (three) times daily as needed for muscle spasms.   DICLOFENAC SODIUM (VOLTAREN) 1 % GEL    Apply 1 application topically 2 (two) times daily as needed (For pain.).    LISINOPRIL (PRINIVIL,ZESTRIL) 10 MG TABLET    TAKE 1 TABLET BY MOUTH ONCE DAILY.   METOPROLOL SUCCINATE (TOPROL-XL) 25 MG 24 HR TABLET    TAKE 1 TABLET BY MOUTH ONCE DAILY.   NITROGLYCERIN (NITROSTAT) 0.4 MG SL TABLET    Place 1 tablet (0.4 mg total) under the tongue every 5 (five) minutes as needed for chest pain.   ROSUVASTATIN (CRESTOR) 5 MG TABLET    Take 1 tablet (5 mg total) by mouth 2 (two) times a week. Take on Wednesday and Sunday.   TRAMADOL (ULTRAM) 50 MG TABLET    Take 50 mg by mouth every 6 (six) hours as needed for moderate pain.   Modified Medications   No medications on file  Discontinued Medications   No medications on file    Return in about 4 weeks (around 11/12/2015), or if symptoms worsen or fail to improve, for And as discussed for chronic medical care.  The patient  was counseled, risk factors were discussed, anticipatory guidance given.  Gross side effects, risk and benefits, and alternatives of medications discussed with patient.  Patient is aware that all medications have potential side effects and we are unable to predict every side effect or drug-drug interaction that may occur.  Expresses verbal understanding and consents to current therapy plan and treatment regimen.  Please see AVS handed out to patient at the end of our visit for further patient instructions/ counseling done pertaining to today's office visit.    Note: This document was prepared using Dragon voice recognition software and may include  unintentional dictation errors.   --------------------------------------------------------------------------------------------------------------------------------------------------------------------------------------------------------------------------------------------    Subjective:    CC:  Chief Complaint  Patient presents with  . Hip Pain    HPI: [name] is a 65 y.o. male who presents to [place] at [facility] today for issues as discussed below.   Patient presents to the clinic today for greater trochanteric bursitis injections. He has gotten them from Dr. Alvan Dame in the past.    Dr. Alvan Dame performed them about once monthly due to the fact that is not in the joint.   Patient has done well with these and desires this again.   He denies any side effect or allergies.   He has a follow-up with Dr. Alvan Dame in the near future to discuss more permanent resolution. They did entertain hip replacement in the past.  Pain is currently significantly hindering his life and his ability to do his ADLs and other activities as he desires. Pain initially today is a 10 out of 10. Worse with walking around, and any repetitive flexion of the hip. He denies any numbness tingling.  Wt Readings from Last 3 Encounters:  10/15/15 178 lb 9.6 oz (81 kg)  09/28/15 177 lb 8.6 oz (80.5 kg)  09/15/15 178 lb (80.7 kg)   BP Readings from Last 3 Encounters:  10/15/15 133/87  09/28/15 129/84  09/15/15 103/72   Pulse Readings from Last 3 Encounters:  10/15/15 71  09/28/15 62  09/15/15 67     Patient Active Problem List   Diagnosis Date Noted  . Chronic anticoagulation: plavix 10/15/2015    Priority: High  . CAD S/P CFX DES 08/07/15 08/08/2015    Priority: High  . Essential hypertension 08/08/2015    Priority: High  . Hyperlipidemia 01/29/2013    Priority: High  . Carotid stenosis-s/p RCE 11/08/2011    Priority: High  . Chronic left hip pain 09/15/2015  . Greater trochanteric bursitis  of left hip 09/15/2015  . Abnormal nuclear cardiac imaging test 08/08/2015  . H/O fatigue with exertion   . Coronary atherosclerosis of native coronary artery 01/29/2013  . Occlusion and stenosis of carotid artery without mention of cerebral infarction 11/13/2012  . Old myocardial infarction 11/29/2007    Past Medical history, Surgical history, Family history, Social history, Allergies and Medications have been entered into the medical record, reviewed and changed as needed.   Allergies:  Allergies  Allergen Reactions  . Brilinta [Ticagrelor]     Shortness of breath  . Statins Other (See Comments)    Failed Crestor 5 mg twice weekly, Crestor 20 mg daily, Pravastatin 40 mg qd, Lipitor, Zocor - muscle aches  . Zetia [Ezetimibe] Other (See Comments)    Muscle aches    Review of Systems: No fever/ chills, night sweats, no unintended weight loss, No chest pain, or increased shortness of breath. No N/V/D.  Pertinent positives and negatives  noted in HPI above    Objective:   Blood pressure 133/87, pulse 71, weight 178 lb 9.6 oz (81 kg). Body mass index is 28.83 kg/m.  General: Well Developed, well nourished, appropriate for stated age.  Neuro: Alert and oriented x3, extra-ocular muscles intact, sensation grossly intact.  HEENT: Normocephalic, atraumatic, neck supple   Skin: Warm and dry, no gross rash. Cardiac: RRR, S1 S2,  no murmurs rubs or gallops.  Respiratory: ECTA B/L, Not using accessory muscles, speaking in full sentences-unlabored. M-SK: Patient with full range of motion hip on flexion, extension, abduction and abduction. Acutely tender over inferior one half of greater trochanter of left hip. No rash, swelling, redness, or skin abnormality. Vascular:  No gross lower ext edema, cap RF less 2 sec. Psych: No SI/HI, Insight and judgement good

## 2015-10-18 NOTE — Assessment & Plan Note (Addendum)
Ice his hip 1520 minutes 3-4 times per day. Avoid any repetitive activities or exercise over the next 48 hours.  Recommend he discuss with Dr. Alvan Dame to send him to physical therapist, specifically Ronnald Nian, DPT at [facility] orth[facility] who specializes in golf therapy which patient does 3-4 times per week.  He told me he would talk to his orthopedist Dr. Alvan Dame about it next visit in approximate 1-2wks

## 2015-10-21 ENCOUNTER — Encounter: Payer: Self-pay | Admitting: Family Medicine

## 2015-10-22 DIAGNOSIS — M9905 Segmental and somatic dysfunction of pelvic region: Secondary | ICD-10-CM | POA: Diagnosis not present

## 2015-10-22 DIAGNOSIS — M5136 Other intervertebral disc degeneration, lumbar region: Secondary | ICD-10-CM | POA: Diagnosis not present

## 2015-10-22 DIAGNOSIS — M9904 Segmental and somatic dysfunction of sacral region: Secondary | ICD-10-CM | POA: Diagnosis not present

## 2015-10-22 DIAGNOSIS — M9903 Segmental and somatic dysfunction of lumbar region: Secondary | ICD-10-CM | POA: Diagnosis not present

## 2015-10-23 DIAGNOSIS — M9904 Segmental and somatic dysfunction of sacral region: Secondary | ICD-10-CM | POA: Diagnosis not present

## 2015-10-23 DIAGNOSIS — M9903 Segmental and somatic dysfunction of lumbar region: Secondary | ICD-10-CM | POA: Diagnosis not present

## 2015-10-23 DIAGNOSIS — M5136 Other intervertebral disc degeneration, lumbar region: Secondary | ICD-10-CM | POA: Diagnosis not present

## 2015-10-23 DIAGNOSIS — M9905 Segmental and somatic dysfunction of pelvic region: Secondary | ICD-10-CM | POA: Diagnosis not present

## 2015-10-27 DIAGNOSIS — M5136 Other intervertebral disc degeneration, lumbar region: Secondary | ICD-10-CM | POA: Diagnosis not present

## 2015-10-27 DIAGNOSIS — M9904 Segmental and somatic dysfunction of sacral region: Secondary | ICD-10-CM | POA: Diagnosis not present

## 2015-10-27 DIAGNOSIS — M9903 Segmental and somatic dysfunction of lumbar region: Secondary | ICD-10-CM | POA: Diagnosis not present

## 2015-10-27 DIAGNOSIS — M9905 Segmental and somatic dysfunction of pelvic region: Secondary | ICD-10-CM | POA: Diagnosis not present

## 2015-10-29 DIAGNOSIS — M9905 Segmental and somatic dysfunction of pelvic region: Secondary | ICD-10-CM | POA: Diagnosis not present

## 2015-10-29 DIAGNOSIS — M9903 Segmental and somatic dysfunction of lumbar region: Secondary | ICD-10-CM | POA: Diagnosis not present

## 2015-10-29 DIAGNOSIS — M9904 Segmental and somatic dysfunction of sacral region: Secondary | ICD-10-CM | POA: Diagnosis not present

## 2015-10-29 DIAGNOSIS — M5136 Other intervertebral disc degeneration, lumbar region: Secondary | ICD-10-CM | POA: Diagnosis not present

## 2015-10-30 DIAGNOSIS — M9903 Segmental and somatic dysfunction of lumbar region: Secondary | ICD-10-CM | POA: Diagnosis not present

## 2015-10-30 DIAGNOSIS — M5136 Other intervertebral disc degeneration, lumbar region: Secondary | ICD-10-CM | POA: Diagnosis not present

## 2015-10-30 DIAGNOSIS — M9904 Segmental and somatic dysfunction of sacral region: Secondary | ICD-10-CM | POA: Diagnosis not present

## 2015-10-30 DIAGNOSIS — M9905 Segmental and somatic dysfunction of pelvic region: Secondary | ICD-10-CM | POA: Diagnosis not present

## 2015-11-10 ENCOUNTER — Encounter: Payer: Self-pay | Admitting: *Deleted

## 2015-11-10 ENCOUNTER — Encounter: Payer: Self-pay | Admitting: Cardiology

## 2015-11-10 DIAGNOSIS — Z006 Encounter for examination for normal comparison and control in clinical research program: Secondary | ICD-10-CM

## 2015-11-10 NOTE — Progress Notes (Signed)
    Subject met inclusion and exclusion criteria. The informed consent form, study requirements and expectations were reviewed with the subject and questions and concerns were addressed prior to the signing of the consent form. The subject verbalized understanding of the trail requirements. The subject agreed to participate in the CLEAR trial and signed the informed consent. The informed consent was obtained prior to performance of any protocol-specific procedures for the subject. A copy of the signed informed consent was given to the subject and a copy was placed in the subject's medical record.        Jake Bathe, RN November 10, 2015 [id number]

## 2015-11-11 DIAGNOSIS — M7062 Trochanteric bursitis, left hip: Secondary | ICD-10-CM | POA: Diagnosis not present

## 2015-11-11 DIAGNOSIS — M25552 Pain in left hip: Secondary | ICD-10-CM | POA: Diagnosis not present

## 2015-11-12 ENCOUNTER — Ambulatory Visit: Payer: PPO | Admitting: Family Medicine

## 2015-11-12 ENCOUNTER — Telehealth: Payer: Self-pay | Admitting: *Deleted

## 2015-11-12 NOTE — Telephone Encounter (Signed)
I spoke with patient about Clear Study screening labs. Patient has qualfied for the study. I will see patient on 10/17/15 at 8:00 am for next study visit.

## 2015-11-17 ENCOUNTER — Encounter: Payer: Self-pay | Admitting: Cardiology

## 2015-11-18 ENCOUNTER — Other Ambulatory Visit: Payer: Self-pay | Admitting: *Deleted

## 2015-11-18 MED ORDER — AMBULATORY NON FORMULARY MEDICATION: 180.0000 mg | Freq: Every day | Status: DC

## 2015-11-26 ENCOUNTER — Ambulatory Visit (INDEPENDENT_AMBULATORY_CARE_PROVIDER_SITE_OTHER): Payer: PPO | Admitting: Family Medicine

## 2015-11-26 ENCOUNTER — Encounter: Payer: Self-pay | Admitting: Family Medicine

## 2015-11-26 VITALS — BP 128/76 | HR 72 | Ht 66.0 in | Wt 176.5 lb

## 2015-11-26 DIAGNOSIS — R1310 Dysphagia, unspecified: Secondary | ICD-10-CM | POA: Insufficient documentation

## 2015-11-26 NOTE — Patient Instructions (Addendum)
Referral to gastroenterology.    Dysphagia Swallowing problems (dysphagia) occur when solids and liquids seem to stick in your throat on the way down to your stomach, or the food takes longer to get to the stomach. Other symptoms include regurgitating food, noises coming from the throat, chest discomfort with swallowing, and a feeling of fullness or the feeling of something being stuck in your throat when swallowing. When blockage in your throat is complete, it may be associated with drooling. CAUSES  Problems with swallowing may occur because of problems with the muscles. The food cannot be propelled in the usual manner into your stomach. You may have ulcers, scar tissue, or inflammation in the tube down which food travels from your mouth to your stomach (esophagus), which blocks food from passing normally into the stomach. Causes of inflammation include:  Acid reflux from your stomach into your esophagus.  Infection.  Radiation treatment for cancer.  Medicines taken without enough fluids to wash them down into your stomach. You may have nerve problems that prevent signals from being sent to the muscles of your esophagus to contract and move your food down to your stomach. Globus pharyngeus is a relatively common problem in which there is a sense of an obstruction or difficulty in swallowing, without any physical abnormalities of the swallowing passages being found. This problem usually improves over time with reassurance and testing to rule out other causes. DIAGNOSIS Dysphagia can be diagnosed and its cause can be determined by tests in which you swallow a white substance that helps illuminate the inside of your throat (contrast medium) while X-rays are taken. Sometimes a flexible telescope that is inserted down your throat (endoscopy) to look at your esophagus and stomach is used. TREATMENT   If the dysphagia is caused by acid reflux or infection, medicines may be used.  If the dysphagia  is caused by problems with your swallowing muscles, swallowing therapy may be used to help you strengthen your swallowing muscles.  If the dysphagia is caused by a blockage or mass, procedures to remove the blockage may be done. HOME CARE INSTRUCTIONS  Try to eat soft food that is easier to swallow and check your weight on a daily basis to be sure that it is not decreasing.  Be sure to drink liquids when sitting upright (not lying down). SEEK MEDICAL CARE IF:  You are losing weight because you are unable to swallow.  You are coughing when you drink liquids (aspiration).  You are coughing up partially digested food. SEEK IMMEDIATE MEDICAL CARE IF:  You are unable to swallow your own saliva .  You are having shortness of breath or a fever, or both.  You have a hoarse voice along with difficulty swallowing. MAKE SURE YOU:  Understand these instructions.  Will watch your condition.  Will get help right away if you are not doing well or get worse.   This information is not intended to replace advice given to you by your health care provider. Make sure you discuss any questions you have with your health care provider.   Document Released: 02/12/2000 Document Revised: 03/07/2014 Document Reviewed: 08/03/2012 Elsevier Interactive Patient Education [employer].

## 2015-11-26 NOTE — Progress Notes (Signed)
Subjective:    HPI: [name] is a 65 y.o. male who presents to [place] at [facility] today for choking episodes.  Pt states that he feels food is getting caught in his throat and has to vomit to clear the food- Not every time only occasionally and seems to be worse in the evenings\with dinner.  Onset x 1 year, but worsening over the last 3-4 months.  Does not recall any specific onset or event which caused these symptoms.  Carbonated beverage makes him feel like he is going to choke.  NO heartburn, no N/V. +  Hiccupps.    Throat CA- Father- Dx mid-50's died 25yo of it. Patient obviously very concerned he has it as well.  Gi- 5 yrs ago- had colonoscpy - 1 polyp, due again. Last at [facility].  BP-  At home 130's / 80's    Wt Readings from Last 3 Encounters:  11/26/15 176 lb 8 oz (80.1 kg)  10/15/15 178 lb 9.6 oz (81 kg)  09/28/15 177 lb 8.6 oz (80.5 kg)   BP Readings from Last 3 Encounters:  11/26/15 128/76  10/15/15 133/87  09/28/15 129/84   Pulse Readings from Last 3 Encounters:  11/26/15 72  10/15/15 71  09/28/15 62   BMI Readings from Last 3 Encounters:  11/26/15 28.49 kg/m  10/15/15 28.83 kg/m  09/28/15 28.66 kg/m     Patient Active Problem List   Diagnosis Date Noted  . Chronic anticoagulation: plavix 10/15/2015    Priority: High  . CAD S/P CFX DES 08/07/15 08/08/2015    Priority: High  . Essential hypertension 08/08/2015    Priority: High  . Hyperlipidemia 01/29/2013    Priority: High  . Carotid stenosis-s/p RCE 11/08/2011    Priority: High  . Difficulty swallowing 11/26/2015  . Dysphagia 11/26/2015  . Chronic left hip pain 09/15/2015  . Greater trochanteric bursitis of left hip 09/15/2015  . Abnormal nuclear cardiac imaging test 08/08/2015  . H/O fatigue with exertion   . Coronary atherosclerosis of native coronary artery 01/29/2013  . Occlusion and stenosis of carotid artery without mention of cerebral infarction  11/13/2012  . Old myocardial infarction 11/29/2007    Past Surgical History:  Procedure Laterality Date  . APPENDECTOMY    . CARDIAC CATHETERIZATION N/A 08/07/2015   Procedure: Left Heart Cath and Coronary Angiography;  Surgeon: [name], MD;  Location: [facility] [place] [facility];  Service: Cardiovascular;  Laterality: N/A;  . CARDIAC CATHETERIZATION N/A 08/07/2015   Procedure: Coronary Stent Intervention;  Surgeon: [name], MD;  Location: [place] [facility];  Service: Cardiovascular;  Laterality: N/A;  . CARDIAC CATHETERIZATION N/A 08/07/2015   Procedure: Coronary Stent Intervention;  Surgeon: [name], MD;  Location: [place] [facility];  Service: Cardiovascular;  Laterality: N/A;  . CAROTID ENDARTERECTOMY  01/05/2006   Right  CEA with DPA  . CORONARY STENT PLACEMENT  08/07/2015   [facility] [place]  . SPINE SURGERY      Family History  Problem Relation Age of Onset  . Heart attack Mother   . Coronary artery disease Mother   . Heart disease Mother     Carotid Stenosis and BPG and Heart Disease before age 7  . Diabetes Mother   . Hypertension Mother   . Heart attack Father   . Heart disease Father     BPG and Heart Disease before age 34  . Hypertension Father   . Cancer  Father 55    throat    History  Drug Use No  ,  History  Alcohol Use  . 3.0 - 3.6 oz/week  . 5 - 6 Glasses of wine per week  ,  History  Smoking Status  . Former Smoker  . Types: Cigarettes  . Quit date: 02/28/2005  Smokeless Tobacco  . Current User  . Types: Snuff  ,  History  Sexual Activity  . Sexual activity: Not on file    Patient's Medications  New Prescriptions   No medications on file  Previous Medications   ACETAMINOPHEN-CODEINE (TYLENOL #3) 300-30 MG TABLET    Take 1 tablet by mouth every 6 (six) hours as needed. For pain   AMBULATORY NON FORMULARY MEDICATION    Take 180 mg by mouth daily. Medication Name: bempedoic acid vs placebo. CLEAR Research study drug provided   AMLODIPINE  (NORVASC) 5 MG TABLET    Take 5 mg by mouth daily.    ASPIRIN EC 81 MG TABLET    Take 1 tablet (81 mg total) by mouth daily.   CELECOXIB (CELEBREX) 200 MG CAPSULE    Take 1 capsule by mouth 3 (three) times a week.   CLOPIDOGREL (PLAVIX) 75 MG TABLET    Take 1 tablet (75 mg total) by mouth daily. Take 300 mg for 1 day then 75 mg a day thereafter   CYCLOBENZAPRINE (FLEXERIL) 10 MG TABLET    Take 1 tablet (10 mg total) by mouth 3 (three) times daily as needed for muscle spasms.   DICLOFENAC SODIUM (VOLTAREN) 1 % GEL    Apply 1 application topically 2 (two) times daily as needed (For pain.).    LISINOPRIL (PRINIVIL,ZESTRIL) 10 MG TABLET    TAKE 1 TABLET BY MOUTH ONCE DAILY.   METOPROLOL SUCCINATE (TOPROL-XL) 25 MG 24 HR TABLET    TAKE 1 TABLET BY MOUTH ONCE DAILY.   NITROGLYCERIN (NITROSTAT) 0.4 MG SL TABLET    Place 1 tablet (0.4 mg total) under the tongue every 5 (five) minutes as needed for chest pain.   ROSUVASTATIN (CRESTOR) 5 MG TABLET    Take 1 tablet (5 mg total) by mouth 2 (two) times a week. Take on Wednesday and Sunday.   TRAMADOL (ULTRAM) 50 MG TABLET    Take 50 mg by mouth every 6 (six) hours as needed for moderate pain.   Modified Medications   No medications on file  Discontinued Medications   CHOLECALCIFEROL (VITAMIN D) 1000 UNITS TABLET    Take 1 tablet (1,000 Units total) by mouth daily.    Brilinta [ticagrelor]; Statins; and Zetia [ezetimibe]  Current Meds  Medication Sig  . acetaminophen-codeine (TYLENOL #3) 300-30 MG tablet Take 1 tablet by mouth every 6 (six) hours as needed. For pain  . AMBULATORY NON FORMULARY MEDICATION Take 180 mg by mouth daily. Medication Name: bempedoic acid vs placebo. CLEAR Research study drug provided  . amLODipine (NORVASC) 5 MG tablet Take 5 mg by mouth daily.   Marland Kitchen aspirin EC 81 MG tablet Take 1 tablet (81 mg total) by mouth daily.  . celecoxib (CELEBREX) 200 MG capsule Take 1 capsule by mouth 3 (three) times a week.  . clopidogrel (PLAVIX) 75  MG tablet Take 1 tablet (75 mg total) by mouth daily. Take 300 mg for 1 day then 75 mg a day thereafter  . cyclobenzaprine (FLEXERIL) 10 MG tablet Take 1 tablet (10 mg total) by mouth 3 (three) times daily as needed for muscle spasms.  Marland Kitchen  diclofenac sodium (VOLTAREN) 1 % GEL Apply 1 application topically 2 (two) times daily as needed (For pain.).   Marland Kitchen lisinopril (PRINIVIL,ZESTRIL) 10 MG tablet TAKE 1 TABLET BY MOUTH ONCE DAILY.  . metoprolol succinate (TOPROL-XL) 25 MG 24 hr tablet TAKE 1 TABLET BY MOUTH ONCE DAILY.  . nitroGLYCERIN (NITROSTAT) 0.4 MG SL tablet Place 1 tablet (0.4 mg total) under the tongue every 5 (five) minutes as needed for chest pain.  . rosuvastatin (CRESTOR) 5 MG tablet Take 1 tablet (5 mg total) by mouth 2 (two) times a week. Take on Wednesday and Sunday. (Patient taking differently: Take 5 mg by mouth once a week. Take on Wednesday and Sunday.)  . traMADol (ULTRAM) 50 MG tablet Take 50 mg by mouth every 6 (six) hours as needed for moderate pain.   . [DISCONTINUED] cholecalciferol (VITAMIN D) 1000 UNITS tablet Take 1 tablet (1,000 Units total) by mouth daily.    Review of Systems:    General:  Denies fever, chills Optho/Auditory:   Denies visual changes, blurred vision Respiratory:   Denies SOB, cough, wheeze, DIB  Cardiovascular:   Denies chest pain, palpitations, painful respirations Gastrointestinal:   Denies nausea, vomiting, diarrhea. + dysphagia Endocrine:     Denies new hot or cold intolerance Musculoskeletal:  Denies joint swelling, gait issues, or new unexplained myalgias/ arthralgias Skin:  Denies rash, suspicious lesions  Neurological:    Denies dizziness, unexplained weakness, numbness  Psychiatric/Behavioral:   Denies mood changes  Objective:  Blood pressure 128/76, pulse 72, height 5\' 6"  (1.676 m), weight 176 lb 8 oz (80.1 kg). Body mass index is 28.49 kg/m.  General: Well Developed, well nourished, and in no acute distress.  HEENT: Oropharynx clear.  Normocephalic, atraumatic, Neck supple, no masses. No thyromegaly. No tenderness to palpation of the neck. Skin: Warm and dry, cap RF less 2 sec, good turgor Cardio vascular:  Regular rate and rhythm, + S1, S2- within normal limits no murmur.  Respiratory: Clear to auscultation bilaterally as well as anteriorly and posteriorly.  No crackles wheezes or rales. speaking in full sentences, no conversational dyspnea M-Sk: No tenderness to palpation of the manubrium/ sternum or sternoclavicular joint, no bony tenderness to palpation of the ribs.  Neuro:  Ambulates w/o assistance, moves * 4 Psych: A and O *3    Impression and Recommendations:    1. Dysphagia   2. Difficulty swallowing     1. After Person discussion with patient and wife, patient desires to go to a gastroenterologist for further evaluation.  I offered patient I can send him for a swallow study\evaluation -the patient declined this. 2. Was going to put patient on PPI, however would rather have him have a nave esophagus when he goes for eval with the gastroenterologist.   Orders Placed This Encounter  Procedures  . Ambulatory referral to Gastroenterology    Referral Priority:   Routine    Referral Type:   Consultation    Referral Reason:   Specialty Services Required    Number of Visits Requested:   1    New Prescriptions   No medications on file    Modified Medications   No medications on file    Discontinued Medications   CHOLECALCIFEROL (VITAMIN D) 1000 UNITS TABLET    Take 1 tablet (1,000 Units total) by mouth daily.     The patient was counseled, risk factors were discussed, anticipatory guidance given.  Gross side effects, risk and benefits, and alternatives of medications discussed with patient.  Patient is aware that all medications have potential side effects and we are unable to predict every side effect or drug-drug interaction that may occur.  Expresses verbal understanding and consents to current therapy  plan and treatment regimen.  Return for Follow-up of current medical issues as discussed prior for chronic care.  Please see AVS handed out to patient at the end of our visit for further patient instructions/ counseling done pertaining to today's office visit.      Note: This document was prepared using Dragon voice recognition software and may include unintentional dictation errors.

## 2015-12-02 ENCOUNTER — Encounter: Payer: Self-pay | Admitting: Physician Assistant

## 2015-12-15 ENCOUNTER — Ambulatory Visit: Payer: PPO | Admitting: Family Medicine

## 2015-12-15 ENCOUNTER — Encounter: Payer: Self-pay | Admitting: Cardiology

## 2015-12-15 ENCOUNTER — Ambulatory Visit (INDEPENDENT_AMBULATORY_CARE_PROVIDER_SITE_OTHER): Payer: PPO | Admitting: Physician Assistant

## 2015-12-15 ENCOUNTER — Encounter: Payer: Self-pay | Admitting: Physician Assistant

## 2015-12-15 VITALS — BP 122/78 | HR 74 | Ht 66.0 in | Wt 183.0 lb

## 2015-12-15 DIAGNOSIS — R1319 Other dysphagia: Secondary | ICD-10-CM

## 2015-12-15 NOTE — Progress Notes (Signed)
Subjective:    Patient ID: CLOYS CORT, male    DOB: [birth year], 65 y.o.   MRN: [id number]  HPI [name] is a pleasant 65 year old white male, new to GI referred by Dr. Everette Rank D.O [facility] medicine for evaluation of dysphagia. Patient states that he did have a colonoscopy done at [facility] to proximally 5 years ago. He does not think that he had any polyps. Patient has not had prior EGD. He reports onset of solid food dysphagia about 1 year ago which  has  been somewhat progressive  with more frequent symptoms over the past 3-4 months. He says he is having an episode a couple of times per week with food lodging in his esophagus. This generally occurs with meat and requires regurgitation. He says occasionally symptoms will improve with standing up which will allow the food to go on down. Generally no difficulty with liquids. No regular heartburn or indigestion. No complaints of abdominal pain. He says he is concerned because his father died from "throat" cancer. Patient does have history of coronary artery disease and has had prior carotid endarterectomy. He underwent cath in June 2017 per Dr. Marlou Porch and had a drug-eluting stent placed to the circumflex. He is currently on aspirin and Plavix. He says he has had no difficulty with chest pain or shortness of breath since his stent was placed.  Review of Systems Pertinent positive and negative review of systems were noted in the above HPI section.  All other review of systems was otherwise negative.  Outpatient Encounter Prescriptions as of 12/15/2015  Medication Sig  . acetaminophen-codeine (TYLENOL #3) 300-30 MG tablet Take 1 tablet by mouth every 6 (six) hours as needed. For pain  . AMBULATORY NON FORMULARY MEDICATION Take 180 mg by mouth daily. Medication Name: bempedoic acid vs placebo. CLEAR Research study drug provided  . amLODipine (NORVASC) 5 MG tablet Take 5 mg by mouth daily.   Marland Kitchen aspirin EC 81 MG tablet Take 1 tablet (81 mg total) by mouth  daily.  . celecoxib (CELEBREX) 200 MG capsule Take 1 capsule by mouth 3 (three) times a week.  . clopidogrel (PLAVIX) 75 MG tablet Take 1 tablet (75 mg total) by mouth daily. Take 300 mg for 1 day then 75 mg a day thereafter  . cyclobenzaprine (FLEXERIL) 10 MG tablet Take 1 tablet (10 mg total) by mouth 3 (three) times daily as needed for muscle spasms.  . diclofenac sodium (VOLTAREN) 1 % GEL Apply 1 application topically 2 (two) times daily as needed (For pain.).   Marland Kitchen lisinopril (PRINIVIL,ZESTRIL) 10 MG tablet TAKE 1 TABLET BY MOUTH ONCE DAILY.  . metoprolol succinate (TOPROL-XL) 25 MG 24 hr tablet TAKE 1 TABLET BY MOUTH ONCE DAILY.  . nitroGLYCERIN (NITROSTAT) 0.4 MG SL tablet Place 1 tablet (0.4 mg total) under the tongue every 5 (five) minutes as needed for chest pain.  . rosuvastatin (CRESTOR) 5 MG tablet Take 1 tablet (5 mg total) by mouth 2 (two) times a week. Take on Wednesday and Sunday. (Patient taking differently: Take 5 mg by mouth once a week. Take on Wednesday and Sunday.)  . traMADol (ULTRAM) 50 MG tablet Take 50 mg by mouth every 6 (six) hours as needed for moderate pain.    No facility-administered encounter medications on file as of 12/15/2015.    Allergies  Allergen Reactions  . Brilinta [Ticagrelor]     Shortness of breath  . Statins Other (See Comments)    Failed Crestor 5 mg twice  weekly, Crestor 20 mg daily, Pravastatin 40 mg qd, Lipitor, Zocor - muscle aches  . Zetia [Ezetimibe] Other (See Comments)    Muscle aches   Patient Active Problem List   Diagnosis Date Noted  . Difficulty swallowing 11/26/2015  . Dysphagia 11/26/2015  . Chronic anticoagulation: plavix 10/15/2015  . Chronic left hip pain 09/15/2015  . Greater trochanteric bursitis of left hip 09/15/2015  . Abnormal nuclear cardiac imaging test 08/08/2015  . CAD S/P CFX DES 08/07/15 08/08/2015  . Essential hypertension 08/08/2015  . H/O fatigue with exertion   . Coronary atherosclerosis of native coronary  artery 01/29/2013  . Hyperlipidemia 01/29/2013  . Occlusion and stenosis of carotid artery without mention of cerebral infarction 11/13/2012  . Carotid stenosis-s/p RCE 11/08/2011  . Old myocardial infarction 11/29/2007   Social History   Social History  . Marital status: Married    Spouse name: N/A  . Number of children: N/A  . Years of education: N/A   Occupational History  . Not on file.   Social History Main Topics  . Smoking status: Former Smoker    Types: Cigarettes    Quit date: 02/28/2005  . Smokeless tobacco: Current User    Types: Snuff  . Alcohol use 3.0 - 3.6 oz/week    5 - 6 Glasses of wine per week  . Drug use: No  . Sexual activity: Not on file   Other Topics Concern  . Not on file   Social History Narrative  . No narrative on file    [name] family history includes Cancer (age of onset: 55) in his father; Coronary artery disease in his mother; Diabetes in his mother; Heart attack in his father and mother; Heart disease in his father and mother; Hypertension in his father and mother.      Objective:    Vitals:   12/15/15 1022  BP: 122/78  Pulse: 74    Physical Exam  well-developed older white male in no acute distress, pleasant blood pressure 122/78 pulse 74 BMI 29.5. HEENT ;nontraumatic normocephalic EOMI PERRLA sclera anicteric, Cardiovascular ;regular rate and rhythm with S1-S2 no murmur or gallop, Pulmonary ;clear bilaterally, Abdomen; soft nontender nondistended bowel sounds are active there is no palpable mass or hepatosplenomegaly, Rectal ;exam not done, Ext; no clubbing cyanosis or edema skin warm and dry, Neuropsych; mood and affect appropriate       Assessment & Plan:   #43 65 year old white male with intermittent solid food dysphagia over the past 3-4 months requiring regurgitation. Rule out esophageal stricture versus ring #2 coronary artery disease status post recent drug-eluting stent to  the circumflex June 2017 #3 chronic  antiplatelet therapy-patient on aspirin and Plavix #4 history of carotid stenosis status post right carotid endarterectomy #5 Colon Neoplasia screening-prior colonoscopy approximate 5 years ago at [facility]; As patient had a drug-eluting stent placed about 4 months ago, we will proceed with barium swallow with tablet first,then if stricture found on barium swallow can discuss timing of EGD with dilation with patient's cardiologist Dr. Marlou Porch  Expect cardiology would prefer him to remain on un -interrupted Plavix for at least 6 months post stent and this was discussed with the patient. We will also ask him to sign a release so we may obtain his records from [facility] to determine need for interval follow-up colonoscopy.  Amy S Esterwood PA-C 12/15/2015   Cc: Mellody Dance, DO

## 2015-12-15 NOTE — Progress Notes (Signed)
Reviewed and agree with initial management plan.  Malcolm T. Stark, MD FACG 

## 2015-12-15 NOTE — Patient Instructions (Signed)
You have been scheduled for a Barium Esophogram at [facility] (1st floor of the hospital) on Tuesday 12-22-2015 at 10:30 am. Please arrive at 10:15 minutes prior to your appointment for registration. Make certain not to have anything to eat or drink 4 hours prior to your test. If you need to reschedule for any reason, please contact radiology at [phone] to do so. __________________________________________________________________ A barium swallow is an examination that concentrates on views of the esophagus. This tends to be a double contrast exam (barium and two liquids which, when combined, create a gas to distend the wall of the oesophagus) or single contrast (non-ionic iodine based). The study is usually tailored to your symptoms so a good history is essential. Attention is paid during the study to the form, structure and configuration of the esophagus, looking for functional disorders (such as aspiration, dysphagia, achalasia, motility and reflux) EXAMINATION You may be asked to change into a gown, depending on the type of swallow being performed. A radiologist and radiographer will perform the procedure. The radiologist will advise you of the type of contrast selected for your procedure and direct you during the exam. You will be asked to stand, sit or lie in several different positions and to hold a small amount of fluid in your mouth before being asked to swallow while the imaging is performed .In some instances you may be asked to swallow barium coated marshmallows to assess the motility of a solid food bolus. The exam can be recorded as a digital or video fluoroscopy procedure. POST PROCEDURE It will take 1-2 days for the barium to pass through your system. To facilitate this, it is important, unless otherwise directed, to increase your fluids for the next 24-48hrs and to resume your normal diet.  This test typically takes about 30 minutes to  perform. __________________________________________________________________________________

## 2015-12-17 ENCOUNTER — Encounter: Payer: Self-pay | Admitting: Cardiology

## 2015-12-18 DIAGNOSIS — M25552 Pain in left hip: Secondary | ICD-10-CM | POA: Diagnosis not present

## 2015-12-18 DIAGNOSIS — M7062 Trochanteric bursitis, left hip: Secondary | ICD-10-CM | POA: Diagnosis not present

## 2015-12-22 ENCOUNTER — Ambulatory Visit (HOSPITAL_COMMUNITY)
Admission: RE | Admit: 2015-12-22 | Discharge: 2015-12-22 | Disposition: A | Discharge status: Home / Self Care | Payer: PPO | Source: Ambulatory Visit | Attending: Physician Assistant | Admitting: Physician Assistant

## 2015-12-22 DIAGNOSIS — K222 Esophageal obstruction: Secondary | ICD-10-CM | POA: Insufficient documentation

## 2015-12-22 DIAGNOSIS — K449 Diaphragmatic hernia without obstruction or gangrene: Secondary | ICD-10-CM | POA: Diagnosis not present

## 2015-12-22 DIAGNOSIS — R1319 Other dysphagia: Secondary | ICD-10-CM | POA: Diagnosis not present

## 2015-12-22 DIAGNOSIS — M5134 Other intervertebral disc degeneration, thoracic region: Secondary | ICD-10-CM | POA: Diagnosis not present

## 2015-12-22 DIAGNOSIS — M9902 Segmental and somatic dysfunction of thoracic region: Secondary | ICD-10-CM | POA: Diagnosis not present

## 2015-12-22 DIAGNOSIS — R1310 Dysphagia, unspecified: Secondary | ICD-10-CM | POA: Diagnosis not present

## 2015-12-25 ENCOUNTER — Telehealth: Payer: Self-pay | Admitting: Physician Assistant

## 2015-12-31 ENCOUNTER — Encounter: Payer: Self-pay | Admitting: Cardiology

## 2015-12-31 ENCOUNTER — Ambulatory Visit (INDEPENDENT_AMBULATORY_CARE_PROVIDER_SITE_OTHER): Payer: PPO | Admitting: Cardiology

## 2015-12-31 VITALS — BP 114/72 | HR 86 | Ht 66.0 in | Wt 180.8 lb

## 2015-12-31 DIAGNOSIS — Z789 Other specified health status: Secondary | ICD-10-CM

## 2015-12-31 DIAGNOSIS — E7800 Pure hypercholesterolemia, unspecified: Secondary | ICD-10-CM

## 2015-12-31 DIAGNOSIS — I2510 Atherosclerotic heart disease of native coronary artery without angina pectoris: Secondary | ICD-10-CM

## 2015-12-31 DIAGNOSIS — I739 Peripheral vascular disease, unspecified: Secondary | ICD-10-CM

## 2015-12-31 NOTE — Telephone Encounter (Signed)
Patient was seen by Dr Marlou Porch today.   Preoperative assessment to esophageal dilatation secondary to dysphagia  - He should not stop Plavix/aspirin at this point. We should wait until 1 year post drug eluting stent placement which will place him in June 2017. He should not proceed with procedure at this time.

## 2015-12-31 NOTE — Progress Notes (Signed)
[facility]. [address], [address] [place], [place]  [address] Phone: [phone] Fax:  [phone]  Date:  12/31/2015   ID:  [name], DOB [birth year], MRN [id number]  PCP:  Mellody Dance, DO   History of Present Illness: [name] is a 65 y.o. male with CAD, 99% circumflex stent placed in June of 2017 here for follow up.   Prior cardiac catheterization in October of 2009 following mildly positive troponin (isolated blood draw), mild non-ST elevation MI which revealed 60% right coronary artery stenosis.  However, chest pain returned, even in Dec of 2016 when doing yard work, went to ER uneventful workup then. Still felt like "I am going to die"- eval. NUC stress this time 06/2015: Moderate size and intensity fixed apical and inferolateral perfusion defect, likely scar. No reversible ischemia. LVEF 41% with inferolateral akinesis. Submaximal exercise, study converted to lexiscan due to DOE. This is an intermediate risk study.  We discussed implications of the stress test, potential changes in his right coronary artery anatomy. Went for cardiac catheterization.  He therefore went to hospital and had cardiac catheterization performed which demonstrated a 99% circumflex stenosis successfully treated with DES. He also had residual 60% distal RCA. EF was normal. Cardiac catheterization took place on 08/07/15.  After probably 7 days of feeling great after stent, he began to feel more short of breath, flushed, feeling overweight. He attributes this to his Brilinta. This was changed to Plavix. Improved.   Previously, had right carotid endarterectomy, amaurosis fugax. He was upset about the increase in cost of vascular ultrasound.  He is had issues in the past tolerating statin therapy, Crestor included. He is currently taking Pralulent. 50% reduction in LDL. Even with approval from his insurance company the medication was $1000 a month. He is unable to take this.  Difficulty with dysphagia,  swallowing steak for instance. GI evaluation was performed. Barium swallow. It was requested that he have a potential esophageal dilatation. At this point, he should not proceed since he is less than 12 months post drug-eluting stent placement. He is not having the chest pain, no shortness of breath, no syncope. He does bruise easily. He is playing golf 5 days a week.   [name] wife here with him.   Wt Readings from Last 3 Encounters:  12/31/15 180 lb 12.8 oz (82 kg)  12/15/15 183 lb (83 kg)  11/26/15 176 lb 8 oz (80.1 kg)     Past Medical History:  Diagnosis Date  . Arthritis   . Carotid artery occlusion   . Coronary atherosclerosis of native coronary artery 01/29/2013  . Heart attack Oct. 2009   Mild  . Hyperlipidemia   . Hypertension   . Shortness of breath dyspnea     Past Surgical History:  Procedure Laterality Date  . APPENDECTOMY    . CARDIAC CATHETERIZATION N/A 08/07/2015   Procedure: Left Heart Cath and Coronary Angiography;  Surgeon: [name], MD;  Location: [facility];  Service: Cardiovascular;  Laterality: N/A;  . CARDIAC CATHETERIZATION N/A 08/07/2015   Procedure: Coronary Stent Intervention;  Surgeon: [name], MD;  Location: [facility];  Service: Cardiovascular;  Laterality: N/A;  . CARDIAC CATHETERIZATION N/A 08/07/2015   Procedure: Coronary Stent Intervention;  Surgeon: [name], MD;  Location: [facility];  Service: Cardiovascular;  Laterality: N/A;  . CAROTID ENDARTERECTOMY  01/05/2006   Right  CEA with DPA  . CORONARY STENT  PLACEMENT  08/07/2015   MID CIRCUMFLEX  . SPINE SURGERY      Current Outpatient Prescriptions  Medication Sig Dispense Refill  . acetaminophen-codeine (TYLENOL #3) 300-30 MG tablet Take 1 tablet by mouth every 6 (six) hours as needed. For pain  0  . AMBULATORY NON FORMULARY MEDICATION Take 180 mg by mouth daily. Medication Name: bempedoic acid vs placebo. CLEAR Research study drug provided    .  amLODipine (NORVASC) 5 MG tablet Take 5 mg by mouth daily.   0  . aspirin EC 81 MG tablet Take 1 tablet (81 mg total) by mouth daily. 90 tablet 3  . celecoxib (CELEBREX) 200 MG capsule Take 1 capsule by mouth 3 (three) times a week.    . clopidogrel (PLAVIX) 75 MG tablet Take 1 tablet (75 mg total) by mouth daily. Take 300 mg for 1 day then 75 mg a day thereafter 34 tablet 6  . cyclobenzaprine (FLEXERIL) 10 MG tablet Take 1 tablet (10 mg total) by mouth 3 (three) times daily as needed for muscle spasms. 15 tablet 0  . diclofenac sodium (VOLTAREN) 1 % GEL Apply 1 application topically 2 (two) times daily as needed (For pain.).   0  . lisinopril (PRINIVIL,ZESTRIL) 10 MG tablet TAKE 1 TABLET BY MOUTH ONCE DAILY. 90 tablet 3  . metoprolol succinate (TOPROL-XL) 25 MG 24 hr tablet TAKE 1 TABLET BY MOUTH ONCE DAILY. 90 tablet 3  . nitroGLYCERIN (NITROSTAT) 0.4 MG SL tablet Place 1 tablet (0.4 mg total) under the tongue every 5 (five) minutes as needed for chest pain. 25 tablet 2  . rosuvastatin (CRESTOR) 5 MG tablet 5 mg. Patient takes 1 tablet by mouth 2 days a week Wed and Sunday.    . traMADol (ULTRAM) 50 MG tablet Take 50 mg by mouth every 6 (six) hours as needed for moderate pain.      No current facility-administered medications for this visit.     Allergies:    Allergies  Allergen Reactions  . Brilinta [Ticagrelor]     Shortness of breath  . Statins Other (See Comments)    Failed Crestor 5 mg twice weekly, Crestor 20 mg daily, Pravastatin 40 mg qd, Lipitor, Zocor - muscle aches  . Zetia [Ezetimibe] Other (See Comments)    Muscle aches    Social History:  The patient  reports that he quit smoking about 10 years ago. His smoking use included Cigarettes. His smokeless tobacco use includes Snuff. He reports that he drinks about 3.0 - 3.6 oz of alcohol per week . He reports that he does not use drugs.   ROS:  Please see the history of present illness.   Denies any fevers, chills, syncope,  orthopnea, PND  PHYSICAL EXAM: VS:  BP 114/72   Pulse 86   Ht 5\' 6"  (1.676 m)   Wt 180 lb 12.8 oz (82 kg)   BMI 29.18 kg/m  Well nourished, well developed, in no acute distress  HEENT: normal  Neck: no JVD right carotid endarterectomy scar noted  Cardiac:  normal S1, S2; RRR; no murmur  Lungs:  clear to auscultation bilaterally, no wheezing, rhonchi or rales  Abd: soft, nontender, no hepatomegaly  Ext: no edema 2+ radial pulse Skin: warm and dry  Neuro: no focal abnormalities noted  EKG:   02/03/15-sinus rhythm, 64, nonspecific T-wave changes. 01/29/14-normal sinus rhythm, 64, nonspecific T-wave changes, flattening. Prior 01/29/13: Sinus rhythm rate 68 with nonspecific T-wave changes.     NUC stress test 07/06/15:  Moderate size and intensity fixed apical and inferolateral perfusion defect, likely scar. No reversible ischemia. LVEF 41% with inferolateral akinesis. Submaximal exercise, study converted to lexiscan due to DOE. This is an intermediate risk study.   Cardiac catheterization 08/07/15: however showed a 99% circumflex lesion treated with DES, 60% RCA, normal ejection fraction.  ASSESSMENT AND PLAN:  Coronary artery disease   - Recent cardiac catheterization June 2017 demonstrated 99% circumflex lesion treated with DES. After approximate a week he felt great. Then he started to feel shortness of breath with his Brilinta.   - Changed him from Brilinta over to Plavix.   - He is in twilight study. I asked him to call the study coordinator to let them know the change.  - Nuclear stress test showed 41% EF however cardiac catheterization showed normal EF.  Preoperative assessment to esophageal dilatation secondary to dysphagia  - He should not stop Plavix/aspirin at this point. We should wait until 1 year post drug eluting stent placement which will place him in June 2017. He should not proceed with procedure at this time.  Peripheral vascular disease/carotid artery disease -prior  right sided carotid endarterectomy.  Hypertension -  Multidrug regimen. Controlled today Continuing to work with both me and Dr. Harrington Challenger. Continue current medications  Hyperlipidemia -Praulent has been re-approved.  However $1000 a month is too much for medical therapy for him. He is not taking. He is trying to take the Crestor a few times a week. Continue to encourage.  - Still having leg cramps at night. Mustard. Magnesium supplementation. Hydration.  6 month follow-up  Signed, Candee Furbish, MD [facility]  12/31/2015 8:36 AM

## 2015-12-31 NOTE — Telephone Encounter (Signed)
After reviewing Dr. Marlou Porch note please talk with patient about EGD with possible biopsy, possible dilation while on Plavix and ASA. If he agrees to the increased risk of bleeding we can proceed and will likely perform a diagnostic EGD and maybe biopsies as indicated, very unlikely that I would perform a dilation.

## 2015-12-31 NOTE — Telephone Encounter (Signed)
-----   Message from Alfredia Ferguson, PA-C sent at 12/22/2015  4:12 PM EDT ----- Please let pt know the barium swallow shows a distal esophageal stricture which is somewhat irregular - would like to go ahead and get him set up for EGD with dilation  with Dr Fuller Plan. He had a stent placed in June 2017 ,so need to send a note to cardiologist Dr Marlou Porch to see if  He would be agreeable to allow pt off Plavix for 5 days prior to procedure at this point - please send communication to him - lets get that answer first then can  schedule procedure

## 2015-12-31 NOTE — Patient Instructions (Signed)

## 2016-01-06 NOTE — Telephone Encounter (Signed)
I spoke with the patient about this. He is willing to proceed with the EGD and possible biopsy on anti coagulation. Not on home oxygen. LVEF 41%. Pre-visit 01/07/16 EGD 01/13/16

## 2016-01-07 ENCOUNTER — Ambulatory Visit: Payer: PPO | Admitting: *Deleted

## 2016-01-07 VITALS — Ht 66.0 in | Wt 180.4 lb

## 2016-01-07 DIAGNOSIS — R1310 Dysphagia, unspecified: Secondary | ICD-10-CM

## 2016-01-07 NOTE — Progress Notes (Signed)
Denies allergies to eggs or soy products. Denies complications with sedation or anesthesia. Denies O2 use. Denies use of diet or weight loss medications.  Emmi instructions given for endoscopy.  

## 2016-01-13 ENCOUNTER — Other Ambulatory Visit: Payer: PPO

## 2016-01-13 ENCOUNTER — Ambulatory Visit (AMBULATORY_SURGERY_CENTER): Payer: PPO | Admitting: Gastroenterology

## 2016-01-13 ENCOUNTER — Encounter: Payer: Self-pay | Admitting: Gastroenterology

## 2016-01-13 ENCOUNTER — Other Ambulatory Visit: Payer: Self-pay | Admitting: *Deleted

## 2016-01-13 ENCOUNTER — Other Ambulatory Visit: Payer: Self-pay | Admitting: Gastroenterology

## 2016-01-13 VITALS — BP 127/70 | HR 54 | Temp 97.3°F | Resp 14 | Ht 66.0 in | Wt 180.0 lb

## 2016-01-13 DIAGNOSIS — K259 Gastric ulcer, unspecified as acute or chronic, without hemorrhage or perforation: Secondary | ICD-10-CM | POA: Diagnosis not present

## 2016-01-13 DIAGNOSIS — I2510 Atherosclerotic heart disease of native coronary artery without angina pectoris: Secondary | ICD-10-CM | POA: Diagnosis not present

## 2016-01-13 DIAGNOSIS — K209 Esophagitis, unspecified without bleeding: Secondary | ICD-10-CM

## 2016-01-13 DIAGNOSIS — R933 Abnormal findings on diagnostic imaging of other parts of digestive tract: Secondary | ICD-10-CM

## 2016-01-13 DIAGNOSIS — R1310 Dysphagia, unspecified: Secondary | ICD-10-CM

## 2016-01-13 DIAGNOSIS — I6523 Occlusion and stenosis of bilateral carotid arteries: Secondary | ICD-10-CM

## 2016-01-13 DIAGNOSIS — I10 Essential (primary) hypertension: Secondary | ICD-10-CM | POA: Diagnosis not present

## 2016-01-13 MED ORDER — SODIUM CHLORIDE 0.9 % IV SOLN: 500.0000 mL | INTRAVENOUS | Status: DC

## 2016-01-13 MED ORDER — PANTOPRAZOLE SODIUM 40 MG PO TBEC: 40.0000 mg | DELAYED_RELEASE_TABLET | Freq: Two times a day (BID) | ORAL | 1 refills | Status: DC

## 2016-01-13 MED ORDER — PANTOPRAZOLE SODIUM 40 MG PO TBEC
40.0000 mg | DELAYED_RELEASE_TABLET | Freq: Every day | ORAL | 11 refills | Status: DC
Start: 2016-01-13 — End: 2016-03-11

## 2016-01-13 NOTE — Patient Instructions (Signed)
YOU HAD AN ENDOSCOPIC PROCEDURE TODAY AT [facility]:   Refer to the procedure report that was given to you for any specific questions about what was found during the examination.  If the procedure report does not answer your questions, please call your gastroenterologist to clarify.  If you requested that your care partner not be given the details of your procedure findings, then the procedure report has been included in a sealed envelope for you to review at your convenience later.  YOU SHOULD EXPECT: Some feelings of bloating in the abdomen. Passage of more gas than usual.  Walking can help get rid of the air that was put into your GI tract during the procedure and reduce the bloating. If you had a lower endoscopy (such as a colonoscopy or flexible sigmoidoscopy) you may notice spotting of blood in your stool or on the toilet paper. If you underwent a bowel prep for your procedure, you may not have a normal bowel movement for a few days.  Please Note:  You might notice some irritation and congestion in your nose or some drainage.  This is from the oxygen used during your procedure.  There is no need for concern and it should clear up in a day or so.  SYMPTOMS TO REPORT IMMEDIATELY:   Following lower endoscopy (colonoscopy or flexible sigmoidoscopy):  Excessive amounts of blood in the stool  Significant tenderness or worsening of abdominal pains  Swelling of the abdomen that is new, acute  Fever of 100F or higher   Following upper endoscopy (EGD)  Vomiting of blood or coffee ground material  New chest pain or pain under the shoulder blades  Painful or persistently difficult swallowing  New shortness of breath  Fever of 100F or higher  Black, tarry-looking stools  For urgent or emergent issues, a gastroenterologist can be reached at any hour by calling [phone].   DIET:  We do recommend a small meal at first, but then you may proceed to your regular diet.  Drink  plenty of fluids but you should avoid alcoholic beverages for 24 hours.  ACTIVITY:  You should plan to take it easy for the rest of today and you should NOT DRIVE or use heavy machinery until tomorrow (because of the sedation medicines used during the test).    FOLLOW UP: Our staff will call the number listed on your records the next business day following your procedure to check on you and address any questions or concerns that you may have regarding the information given to you following your procedure. If we do not reach you, we will leave a message.  However, if you are feeling well and you are not experiencing any problems, there is no need to return our call.  We will assume that you have returned to your regular daily activities without incident.  If any biopsies were taken you will be contacted by phone or by letter within the next 1-3 weeks.  Please call us at [phone] if you have not heard about the biopsies in 3 weeks.    SIGNATURES/CONFIDENTIALITY: You and/or your care partner have signed paperwork which will be entered into your electronic medical record.  These signatures attest to the fact that that the information above on your After Visit Summary has been reviewed and is understood.  Full responsibility of the confidentiality of this discharge information lies with you and/or your care-partner.  Hiatal hernia, esophagitis, stenosis.  Start protonix as ordered. See Dr.  Fuller Plan in 6 months.  Will treat h-pylori if positive.

## 2016-01-13 NOTE — Progress Notes (Signed)
To recovery, report to Scott, RN, VSS 

## 2016-01-13 NOTE — Op Note (Signed)
[facility] Patient Name: [name] Procedure Date: 01/13/2016 10:29 AM MRN: [id number] Endoscopist: Ladene Artist , MD Age: 65 Referring MD:  Date of Birth: October 12, 1950 Gender: Male Account #: [id number] Procedure:                Upper GI endoscopy Indications:              Esophageal dysphagia, Abnormal cine-esophagram Medicines:                Monitored Anesthesia Care Procedure:                Pre-Anesthesia Assessment:                           - Prior to the procedure, a History and Physical                            was performed, and patient medications and                            allergies were reviewed. The patient's tolerance of                            previous anesthesia was also reviewed. The risks                            and benefits of the procedure and the sedation                            options and risks were discussed with the patient.                            All questions were answered, and informed consent                            was obtained. Prior Anticoagulants: The patient has                            taken Plavix (clopidogrel), last dose was day of                            procedure. ASA Grade Assessment: III - A patient                            with severe systemic disease. After reviewing the                            risks and benefits, the patient was deemed in                            satisfactory condition to undergo the procedure.                           After obtaining informed consent, the endoscope was  passed under direct vision. Throughout the                            procedure, the patient's blood pressure, pulse, and                            oxygen saturations were monitored continuously. The                            Model GIF-[id number]) scope was introduced                            through the mouth, and advanced to the second part   of duodenum. The upper GI endoscopy was                            accomplished without difficulty. The patient                            tolerated the procedure well. Scope In: Scope Out: Findings:                 LA Grade B (one or more mucosal breaks greater than                            5 mm, not extending between the tops of two mucosal                            folds) esophagitis with no bleeding was found in                            the distal esophagus.                           One moderate benign-appearing, intrinsic stenosis                            was found 38 cm from the incisors. This measured 1                            cm (inner diameter). The z -line was found at 40 cm                            from the incisors. Pt taking Plavix so not dilated.                           The exam of the esophagus was otherwise normal.                           A few localized, small non-bleeding erosions were                            found in the gastric antrum. There were no stigmata  of recent bleeding. pt taking Plavix so no biopsies                            obtained.                           A small hiatal hernia was present.                           The exam of the stomach was otherwise normal.                           A few localized erosions without bleeding were                            found in the duodenal bulb.                           The second portion of the duodenum was normal. Complications:            No immediate complications. Estimated Blood Loss:     Estimated blood loss: none. Impression:               - LA Grade B reflux esophagitis.                           - Benign-appearing esophageal stenosis.                           - Non-bleeding erosive gastropathy.                           - Small hiatal hernia.                           - Duodenal erosions without bleeding.                           - Normal second  portion of the duodenum.                           - No specimens collected. Recommendation:           - Patient has a contact number available for                            emergencies. The signs and symptoms of potential                            delayed complications were discussed with the                            patient. Return to normal activities tomorrow.                            Written discharge instructions were provided to the  patient.                           - Resume previous diet.                           - Continue present medications.                           - Return to GI office in 6 months.                           - Protonix (pantoprazole) 40 mg PO BID for 2                            months, then Protonix 40 mg po qam Auriemma term.                           - H. pylori Ab-treat if positive Ladene Artist, MD 01/13/2016 10:50:09 AM This report has been signed electronically.

## 2016-01-13 NOTE — Progress Notes (Signed)
Pt. To lab for blood test for h-pylori prior to leaving clinic,

## 2016-01-14 ENCOUNTER — Telehealth: Payer: Self-pay

## 2016-01-14 NOTE — Telephone Encounter (Signed)
Left a message at [phone] with ID of "Ricky" on recording.  Pt to call us back if any questions or concerns. maw

## 2016-01-15 ENCOUNTER — Telehealth: Payer: Self-pay

## 2016-01-15 ENCOUNTER — Telehealth: Payer: Self-pay | Admitting: Gastroenterology

## 2016-01-15 ENCOUNTER — Other Ambulatory Visit: Payer: Self-pay

## 2016-01-15 LAB — H PYLORI, IGM, IGG, IGA AB
H PYLORI IGG: 6.5 U/mL — AB (ref 0.0–0.8)
H. pylori, IgA Abs: 16.4 units — ABNORMAL HIGH (ref 0.0–8.9)

## 2016-01-15 MED ORDER — BIS SUBCIT-METRONID-TETRACYC 140-125-125 MG PO CAPS: 3.0000 | ORAL_CAPSULE | Freq: Three times a day (TID) | ORAL | 0 refills | Status: DC

## 2016-01-15 NOTE — Telephone Encounter (Signed)
  Follow up Call-  Call back number 01/13/2016  Post procedure Call Back phone  # [phone]  Permission to leave phone message Yes  Some recent data might be hidden    Patient was called for follow up after his procedure on 01/13/2016. No answer at the number given for follow up phone call. A message was left on the answering machine.

## 2016-01-15 NOTE — Telephone Encounter (Signed)
Questions clarified with the pharmacist

## 2016-01-20 ENCOUNTER — Encounter: Payer: Self-pay | Admitting: *Deleted

## 2016-01-20 DIAGNOSIS — Z006 Encounter for examination for normal comparison and control in clinical research program: Secondary | ICD-10-CM

## 2016-01-20 NOTE — Progress Notes (Signed)
I saw patient for Month 1 visit on 01/19/16. Patient is doing well on study drug and is compliant with study medication. I stressed the heart healthy diet and regular exercise program. I will see patient in 2 months for next study visit.

## 2016-02-01 ENCOUNTER — Telehealth: Payer: Self-pay | Admitting: Gastroenterology

## 2016-02-01 NOTE — Telephone Encounter (Signed)
Patient notified to keep the appt for 03/18/16

## 2016-02-19 DIAGNOSIS — M7062 Trochanteric bursitis, left hip: Secondary | ICD-10-CM | POA: Diagnosis not present

## 2016-02-19 DIAGNOSIS — M25552 Pain in left hip: Secondary | ICD-10-CM | POA: Diagnosis not present

## 2016-03-11 ENCOUNTER — Encounter: Payer: Self-pay | Admitting: Family Medicine

## 2016-03-11 ENCOUNTER — Ambulatory Visit (INDEPENDENT_AMBULATORY_CARE_PROVIDER_SITE_OTHER): Payer: PPO | Admitting: Family Medicine

## 2016-03-11 VITALS — BP 128/84 | HR 62 | Ht 66.0 in | Wt 178.5 lb

## 2016-03-11 DIAGNOSIS — M25552 Pain in left hip: Secondary | ICD-10-CM

## 2016-03-11 DIAGNOSIS — M7062 Trochanteric bursitis, left hip: Secondary | ICD-10-CM

## 2016-03-11 DIAGNOSIS — G8929 Other chronic pain: Secondary | ICD-10-CM | POA: Diagnosis not present

## 2016-03-11 NOTE — Patient Instructions (Signed)
[phone]- I will call you if the physical therapy department once I talk to them says that they do not have modalities to help you pain in between the shots.

## 2016-03-11 NOTE — Progress Notes (Signed)
Impression and Recommendations:    1. Greater trochanteric bursitis of left hip   2. Chronic left hip pain     Greater trochanteric bursitis of left hip Ice 15-20 minutes 3-4 times daily.  Avoid aggravating activity.  We'll refer to physical therapy where his with BX surgeon as-patient prefers [facility] since he sees Dr. Alvan Dame.  Consider dexamethasone iontophoresis, in addition to the assessment and plan and treatment plan per physical therapist.      Orders Placed This Encounter  Procedures  . Ambulatory referral to Physical Therapy     New Prescriptions   No medications on file    Modified Medications   No medications on file    Discontinued Medications   PANTOPRAZOLE (PROTONIX) 40 MG TABLET    Take 1 tablet (40 mg total) by mouth daily.    The patient was counseled, risk factors were discussed, anticipatory guidance given.  Gross side effects, risk and benefits, and alternatives of medications and treatment plan in general discussed with patient.  Patient is aware that all medications have potential side effects and we are unable to predict every side effect or drug-drug interaction that may occur.   Patient will call with any questions prior to using medication if they have concerns.  Expresses verbal understanding and consents to current therapy and treatment regimen.  No barriers to understanding were identified.  Red flag symptoms and signs discussed in detail.  Patient expressed understanding regarding what to do in case of emergency\urgent symptoms  No Follow-up on file.  Please see AVS handed out to patient at the end of our visit for further patient instructions/ counseling done pertaining to today's office visit.    Note: This document was prepared using Dragon voice recognition software and may include unintentional dictation  errors.   --------------------------------------------------------------------------------------------------------------------------------------------------------------------------------------------------------------------------------------------    Subjective:    CC:  Chief Complaint  Patient presents with  . Hip Pain    HPI: [name] is a 66 y.o. male who presents to [place] at [facility] today for issues as discussed below.  -    L greater ctoch bursiotos at least 2 wks now./   Intitialy did too much biking too sonn---> firs ttime our and did 56min at once.  WEver since then- been very ahcy, can't lay on that side.  Can't golf.    Does get injections from ortho--for same thing  Patient tells me he got injections from Ortho approximately 2 months ago or less into left greater trochanteric region.Marland Kitchen  Has never had physical therapy.   Wt Readings from Last 3 Encounters:  03/11/16 178 lb 8 oz (81 kg)  01/19/16 180 lb (81.6 kg)  01/13/16 180 lb (81.6 kg)   BP Readings from Last 3 Encounters:  03/11/16 128/84  01/19/16 134/70  01/13/16 127/70   Pulse Readings from Last 3 Encounters:  03/11/16 62  01/19/16 60  01/13/16 (!) 54   BMI Readings from Last 3 Encounters:  03/11/16 28.81 kg/m  01/19/16 29.05 kg/m  01/13/16 29.05 kg/m     Patient Care Team    Relationship Specialty Notifications Start End  Mellody Dance, DO PCP - General Family Medicine  09/14/15     Patient Active Problem List   Diagnosis Date Noted  . Chronic anticoagulation: plavix 10/15/2015    Priority: High  . CAD S/P CFX DES 08/07/15 08/08/2015    Priority: High  . Essential hypertension 08/08/2015    Priority: High  .  Hyperlipidemia 01/29/2013    Priority: High  . Carotid stenosis-s/p RCE 11/08/2011    Priority: High  . Chronic left hip pain 09/15/2015    Priority: Medium  . Greater trochanteric bursitis of left hip 09/15/2015    Priority: Medium  . Difficulty  swallowing 11/26/2015  . Dysphagia 11/26/2015  . Abnormal nuclear cardiac imaging test 08/08/2015  . H/O fatigue with exertion   . Coronary atherosclerosis of native coronary artery 01/29/2013  . Occlusion and stenosis of carotid artery without mention of cerebral infarction 11/13/2012  . Old myocardial infarction 11/29/2007    Past Medical history, Surgical history, Family history, Social history, Allergies and Medications have been entered into the medical record, reviewed and changed as needed.   Allergies:  Allergies  Allergen Reactions  . Brilinta [Ticagrelor]     Shortness of breath  . Statins Other (See Comments)    Failed Crestor 5 mg twice weekly, Crestor 20 mg daily, Pravastatin 40 mg qd, Lipitor, Zocor - muscle aches  . Zetia [Ezetimibe] Other (See Comments)    Muscle aches    Review of Systems  Constitutional: Negative for chills and fever.  Gastrointestinal: Negative for abdominal pain.  Genitourinary: Negative for flank pain.  Musculoskeletal: Positive for joint pain and myalgias. Negative for back pain and falls.  Skin: Negative for itching and rash.  Neurological: Negative for dizziness, sensory change and focal weakness.   Review of Systems: General:   Denies fever, chills, unexplained weight loss.  Optho/Auditory:   Denies visual changes, blurred vision/LOV Respiratory:   Denies SOB, DOE more than baseline levels.  Cardiovascular:   Denies chest pain, palpitations, new onset peripheral edema  Gastrointestinal:   Denies nausea, vomiting, diarrhea.  Genitourinary: Denies dysuria, freq/ urgency, flank pain or discharge from genitals.  Endocrine:     Denies hot or cold intolerance, polyuria, polydipsia. Musculoskeletal:   Denies unexplained myalgias, joint swelling, unexplained arthralgias, gait problems.  Skin:  Denies rash, suspicious lesions Neurological:     Denies dizziness, unexplained weakness, numbness  Psychiatric/Behavioral:   Denies mood changes,  suicidal or homicidal ideations, hallucinations     Objective:   Blood pressure 128/84, pulse 62, height 5\' 6"  (1.676 m), weight 178 lb 8 oz (81 kg). Body mass index is 28.81 kg/m. General: Well Developed, well nourished, appropriate for stated age.  Neuro: Alert and oriented x3, extra-ocular muscles intact, sensation grossly intact.  HEENT: Normocephalic, atraumatic, neck supple, no carotid bruits appreciated  Skin: no gross rash. Cardiac: RRR, S1 S2 Respiratory: ECTA B/L, Not using accessory muscles, speaking in full sentences-unlabored. Vascular:  Ext warm, dry, pink; cap RF less 2 sec. Psych: No HI/SI, judgement and insight good, Euthymic mood. Full Affect.

## 2016-03-12 NOTE — Assessment & Plan Note (Addendum)
Ice 15-20 minutes 3-4 times daily.  Avoid aggravating activity.     We'll refer to physical therapy where his with BX surgeon as-patient prefers [facility] since he sees Dr. Alvan Dame.  Consider dexamethasone iontophoresis, in addition to the assessment and plan and treatment plan per physical therapist.

## 2016-03-14 NOTE — Addendum Note (Signed)
Addended by: Fonnie Mu on: 03/14/2016 10:32 AM   Modules accepted: Orders

## 2016-03-18 ENCOUNTER — Other Ambulatory Visit: Payer: Self-pay | Admitting: Cardiology

## 2016-03-18 ENCOUNTER — Ambulatory Visit: Payer: PPO | Admitting: Gastroenterology

## 2016-03-19 ENCOUNTER — Other Ambulatory Visit: Payer: Self-pay | Admitting: Cardiology

## 2016-03-21 ENCOUNTER — Other Ambulatory Visit: Payer: Self-pay | Admitting: *Deleted

## 2016-03-21 MED ORDER — CLOPIDOGREL BISULFATE 75 MG PO TABS: 75.0000 mg | ORAL_TABLET | Freq: Every day | ORAL | 2 refills | Status: DC

## 2016-03-22 DIAGNOSIS — M9903 Segmental and somatic dysfunction of lumbar region: Secondary | ICD-10-CM | POA: Diagnosis not present

## 2016-03-22 DIAGNOSIS — M9905 Segmental and somatic dysfunction of pelvic region: Secondary | ICD-10-CM | POA: Diagnosis not present

## 2016-03-22 DIAGNOSIS — M5136 Other intervertebral disc degeneration, lumbar region: Secondary | ICD-10-CM | POA: Diagnosis not present

## 2016-03-22 DIAGNOSIS — M9904 Segmental and somatic dysfunction of sacral region: Secondary | ICD-10-CM | POA: Diagnosis not present

## 2016-03-23 ENCOUNTER — Other Ambulatory Visit: Payer: Self-pay | Admitting: Family Medicine

## 2016-03-23 ENCOUNTER — Telehealth: Payer: Self-pay

## 2016-03-23 DIAGNOSIS — M7062 Trochanteric bursitis, left hip: Secondary | ICD-10-CM | POA: Diagnosis not present

## 2016-03-23 NOTE — Telephone Encounter (Signed)
Ronalee Belts with [facility] called stating that pt needs an RX for dexamethasone 0.4% iontophresis solution for injection.  Please send RX to [facility].  Charyl Bigger, CMA

## 2016-03-23 NOTE — Progress Notes (Signed)
-->   Since I cannot find the exact concentration in our database can you please call in this prescription to date city thank you.  Dexamethasone 0.4% solution for iontophoresis sig: 1 mL applied to pad as directed by physical therapist for iontophoresis  Dispense: 30 ML.

## 2016-03-29 DIAGNOSIS — M7062 Trochanteric bursitis, left hip: Secondary | ICD-10-CM | POA: Diagnosis not present

## 2016-04-08 ENCOUNTER — Encounter (HOSPITAL_COMMUNITY): Payer: PPO

## 2016-04-08 ENCOUNTER — Ambulatory Visit: Payer: PPO | Admitting: Family

## 2016-04-20 ENCOUNTER — Ambulatory Visit: Payer: PPO | Admitting: Gastroenterology

## 2016-04-20 DIAGNOSIS — M25552 Pain in left hip: Secondary | ICD-10-CM | POA: Diagnosis not present

## 2016-04-20 DIAGNOSIS — M76892 Other specified enthesopathies of left lower limb, excluding foot: Secondary | ICD-10-CM | POA: Diagnosis not present

## 2016-04-20 DIAGNOSIS — M7062 Trochanteric bursitis, left hip: Secondary | ICD-10-CM | POA: Diagnosis not present

## 2016-04-27 ENCOUNTER — Other Ambulatory Visit: Payer: Self-pay | Admitting: Cardiology

## 2016-04-29 DIAGNOSIS — M9905 Segmental and somatic dysfunction of pelvic region: Secondary | ICD-10-CM | POA: Diagnosis not present

## 2016-04-29 DIAGNOSIS — M9903 Segmental and somatic dysfunction of lumbar region: Secondary | ICD-10-CM | POA: Diagnosis not present

## 2016-04-29 DIAGNOSIS — M9904 Segmental and somatic dysfunction of sacral region: Secondary | ICD-10-CM | POA: Diagnosis not present

## 2016-04-29 DIAGNOSIS — M5136 Other intervertebral disc degeneration, lumbar region: Secondary | ICD-10-CM | POA: Diagnosis not present

## 2016-05-05 ENCOUNTER — Telehealth: Payer: Self-pay | Admitting: Family Medicine

## 2016-05-05 DIAGNOSIS — L989 Disorder of the skin and subcutaneous tissue, unspecified: Secondary | ICD-10-CM

## 2016-05-05 NOTE — Telephone Encounter (Signed)
PT called to request referral to a [facility] dermatology doctor for treatment of a possible skin cancer  Red patch of skin on right hand-- he just wants it to be screened by a dermatologist-- Pls contact him.--glh

## 2016-05-12 ENCOUNTER — Encounter: Payer: Self-pay | Admitting: Gastroenterology

## 2016-05-12 ENCOUNTER — Ambulatory Visit (INDEPENDENT_AMBULATORY_CARE_PROVIDER_SITE_OTHER): Payer: PPO | Admitting: Gastroenterology

## 2016-05-12 ENCOUNTER — Telehealth: Payer: Self-pay

## 2016-05-12 VITALS — BP 122/80 | HR 76 | Ht 65.5 in | Wt 183.0 lb

## 2016-05-12 DIAGNOSIS — K2100 Gastro-esophageal reflux disease with esophagitis, without bleeding: Secondary | ICD-10-CM

## 2016-05-12 DIAGNOSIS — K222 Esophageal obstruction: Secondary | ICD-10-CM

## 2016-05-12 DIAGNOSIS — R1310 Dysphagia, unspecified: Secondary | ICD-10-CM

## 2016-05-12 DIAGNOSIS — R1319 Other dysphagia: Secondary | ICD-10-CM

## 2016-05-12 NOTE — Telephone Encounter (Signed)
Yes, he may come off his Plavix for 5 days prior to endoscopy scheduled for 07/29/2016. This will have been 12 months of full dual antiplatelet therapy post his circumflex drug-eluting stent placement.  Candee Furbish, MD

## 2016-05-12 NOTE — Telephone Encounter (Signed)
   NIMROD WENDT 1950-11-15 [id number]  Dear Dr. Marlou Porch:  We have scheduled the above named patient for a(n) Endoscopy with dilation procedure. Our records show that (s)he is on anticoagulation therapy.  Please advise as to whether the patient may come of their therapy of Plavix 5 days prior to their procedure which is scheduled for 07/29/16.  Please route your response to Marlon Pel, CMA or fax response to [phone].  Sincerely,    [facility]

## 2016-05-12 NOTE — Patient Instructions (Signed)
You have been scheduled for an endoscopy. Please follow written instructions given to you at your visit today. If you use inhalers (even only as needed), please bring them with you on the day of your procedure. Your physician has requested that you go to www.startemmi.com and enter the access code given to you at your visit today. This web site gives a general overview about your procedure. However, you should still follow specific instructions given to you by our office regarding your preparation for the procedure.  Thank you for choosing me and [facility].  Malcolm T. Stark, Jr., MD., FACG  

## 2016-05-12 NOTE — Telephone Encounter (Signed)
Left a message for patient to return my call. 

## 2016-05-12 NOTE — Progress Notes (Signed)
    History of Present Illness: This is a 66 year old male with a history of erosive esophagitis and an esophageal stricture. He underwent EGD without dilation in 12/2015 as he needed to remain on Plavix and aspirin due to a drug-eluting stent placed in 07/2015. H. pylori gastritis was identified and he was treated with a 14 day course of PPI twice a day and Pylera 4 times a day. He has ongoing solid food dysphagia that has not changed. No other GI complaints.  Current Medications, Allergies, Past Medical History, Past Surgical History, Family History and Social History were reviewed in [employer] record.  Physical Exam: General: Well developed, well nourished, no acute distress Head: Normocephalic and atraumatic Eyes:  sclerae anicteric, EOMI Ears: Normal auditory acuity Mouth: No deformity or lesions Lungs: Clear throughout to auscultation Heart: Regular rate and rhythm; no murmurs, rubs or bruits Abdomen: Soft, non tender and non distended. No masses, hepatosplenomegaly or hernias noted. Normal Bowel sounds Musculoskeletal: Symmetrical with no gross deformities  Pulses:  Normal pulses noted Extremities: No clubbing, cyanosis, edema or deformities noted Neurological: Alert oriented x 4, grossly nonfocal Psychological:  Alert and cooperative. Normal mood and affect  Assessment and Recommendations:  1. Erosive esophagitis with an esophageal stricture. In June it will be one year on Plavix, ASA and I presume his cardiologist will approve a 5 day hold Plavix to allow for esophageal stricture dilation. Pt can and should remain on ASA qd while off Plavix for EGD. Advised the patient to schedule appointment with his cardiologist. We will tentatively schedule EGD with dilation for June. Continue pantoprazole 40 mg qam, antireflux measure and a soft diet.   2. CAD with drug eluting stent. Hold Plavix 5 days before procedure - will instruct when and how to resume after  procedure. Low but real risk of cardiovascular event such as heart attack, stroke, embolism, thrombosis or ischemia/infarct of other organs off Plavix explained and need to seek urgent help if this occurs. The patient consents to proceed. Will communicate by phone or EMR with patient's prescribing provider to confirm that holding Plavix is reasonable in this case.

## 2016-05-12 NOTE — Telephone Encounter (Signed)
Spoke with patient and informed him that per Dr. Marlou Porch patient can hold Plavix 5 days prior to his colonoscopy in June. Patient verbalized understanding.

## 2016-05-17 ENCOUNTER — Other Ambulatory Visit: Payer: Self-pay | Admitting: Physician Assistant

## 2016-05-17 DIAGNOSIS — D229 Melanocytic nevi, unspecified: Secondary | ICD-10-CM | POA: Diagnosis not present

## 2016-05-17 DIAGNOSIS — D492 Neoplasm of unspecified behavior of bone, soft tissue, and skin: Secondary | ICD-10-CM | POA: Diagnosis not present

## 2016-05-17 DIAGNOSIS — L570 Actinic keratosis: Secondary | ICD-10-CM | POA: Diagnosis not present

## 2016-05-24 DIAGNOSIS — M5136 Other intervertebral disc degeneration, lumbar region: Secondary | ICD-10-CM | POA: Diagnosis not present

## 2016-05-24 DIAGNOSIS — M76892 Other specified enthesopathies of left lower limb, excluding foot: Secondary | ICD-10-CM | POA: Diagnosis not present

## 2016-05-24 DIAGNOSIS — M25552 Pain in left hip: Secondary | ICD-10-CM | POA: Diagnosis not present

## 2016-06-14 ENCOUNTER — Encounter: Payer: PPO | Admitting: Family Medicine

## 2016-06-21 ENCOUNTER — Telehealth: Payer: Self-pay | Admitting: Gastroenterology

## 2016-06-21 ENCOUNTER — Encounter: Payer: Self-pay | Admitting: *Deleted

## 2016-06-21 DIAGNOSIS — Z006 Encounter for examination for normal comparison and control in clinical research program: Secondary | ICD-10-CM

## 2016-06-21 DIAGNOSIS — M9905 Segmental and somatic dysfunction of pelvic region: Secondary | ICD-10-CM | POA: Diagnosis not present

## 2016-06-21 DIAGNOSIS — M5136 Other intervertebral disc degeneration, lumbar region: Secondary | ICD-10-CM | POA: Diagnosis not present

## 2016-06-21 DIAGNOSIS — M9903 Segmental and somatic dysfunction of lumbar region: Secondary | ICD-10-CM | POA: Diagnosis not present

## 2016-06-21 DIAGNOSIS — M9904 Segmental and somatic dysfunction of sacral region: Secondary | ICD-10-CM | POA: Diagnosis not present

## 2016-06-21 NOTE — Telephone Encounter (Signed)
Informed patient that we spoke on 05/12/16 and I informed him again to hold Plavix 5 days before his procedure on 07/29/16. Patient verbalized understanding.

## 2016-06-21 NOTE — Progress Notes (Signed)
Re-consent to Version 3.0 25 Feb 2016 Subject met inclusion and exclusion criteria. The informed consent form, study requirements and expectations were reviewed with the subject and questions and concerns were addressed prior to the signing of the consent form. The subject verbalized understanding of the trail requirements. The subject agreed to participate in the CLEARtrial and signed the informed consent. The informed consent was obtained prior to performance of any protocol-specific procedures for the subject. A copy of the signed informed consent was given to the subject and a copy was placed in the subject's medical record.  Jake Bathe, RN 06/21/2016  Subject into clinic for research visit.  Subject states doing well. Continuing in study, I dispensed 2 bottles of CLEAR research drug.

## 2016-06-23 ENCOUNTER — Telehealth: Payer: Self-pay | Admitting: *Deleted

## 2016-06-23 NOTE — Telephone Encounter (Signed)
I called patient to ask him to come back for a CBC re-draw. Dr.Stuckey reviewed labs and asked to have CBC repeated. Patient will come in on 06/29/16 at 9:30am.

## 2016-06-28 ENCOUNTER — Encounter: Payer: Self-pay | Admitting: Family

## 2016-06-29 DIAGNOSIS — M25562 Pain in left knee: Secondary | ICD-10-CM | POA: Diagnosis not present

## 2016-06-29 DIAGNOSIS — M7062 Trochanteric bursitis, left hip: Secondary | ICD-10-CM | POA: Diagnosis not present

## 2016-06-30 ENCOUNTER — Telehealth: Payer: Self-pay | Admitting: *Deleted

## 2016-06-30 NOTE — Telephone Encounter (Addendum)
Repeated cbc with diff locally.  Dr Lia Foyer wanted a repeat of CLEAR research study labwork which showed a WBC of 3.8. [facility] repeat showed WBC of 5.5 /uL within normal limits.  Patient notified of results.  Patient appreciative of the call.

## 2016-07-07 ENCOUNTER — Ambulatory Visit (INDEPENDENT_AMBULATORY_CARE_PROVIDER_SITE_OTHER): Payer: PPO | Admitting: Family

## 2016-07-07 ENCOUNTER — Encounter: Payer: Self-pay | Admitting: Family

## 2016-07-07 ENCOUNTER — Ambulatory Visit (HOSPITAL_COMMUNITY)
Admission: RE | Admit: 2016-07-07 | Discharge: 2016-07-07 | Disposition: A | Discharge status: Home / Self Care | Payer: PPO | Source: Ambulatory Visit | Attending: Family | Admitting: Family

## 2016-07-07 VITALS — BP 138/87 | HR 70 | Temp 97.2°F | Resp 18 | Ht 66.0 in | Wt 179.0 lb

## 2016-07-07 DIAGNOSIS — I6523 Occlusion and stenosis of bilateral carotid arteries: Secondary | ICD-10-CM | POA: Diagnosis not present

## 2016-07-07 DIAGNOSIS — Z720 Tobacco use: Secondary | ICD-10-CM | POA: Diagnosis not present

## 2016-07-07 DIAGNOSIS — Z87891 Personal history of nicotine dependence: Secondary | ICD-10-CM

## 2016-07-07 DIAGNOSIS — Z9889 Other specified postprocedural states: Secondary | ICD-10-CM | POA: Insufficient documentation

## 2016-07-07 NOTE — Patient Instructions (Signed)
Stroke Prevention Some medical conditions and behaviors are associated with an increased chance of having a stroke. You may prevent a stroke by making healthy choices and managing medical conditions. How can I reduce my risk of having a stroke?  Stay physically active. Get at least 30 minutes of activity on most or all days.  Do not smoke. It may also be helpful to avoid exposure to secondhand smoke.  Limit alcohol use. Moderate alcohol use is considered to be:  No more than 2 drinks per day for men.  No more than 1 drink per day for nonpregnant women.  Eat healthy foods. This involves:  Eating 5 or more servings of fruits and vegetables a day.  Making dietary changes that address high blood pressure (hypertension), high cholesterol, diabetes, or obesity.  Manage your cholesterol levels.  Making food choices that are high in fiber and low in saturated fat, trans fat, and cholesterol may control cholesterol levels.  Take any prescribed medicines to control cholesterol as directed by your health care provider.  Manage your diabetes.  Controlling your carbohydrate and sugar intake is recommended to manage diabetes.  Take any prescribed medicines to control diabetes as directed by your health care provider.  Control your hypertension.  Making food choices that are low in salt (sodium), saturated fat, trans fat, and cholesterol is recommended to manage hypertension.  Ask your health care provider if you need treatment to lower your blood pressure. Take any prescribed medicines to control hypertension as directed by your health care provider.  If you are 18-39 years of age, have your blood pressure checked every 3-5 years. If you are 40 years of age or older, have your blood pressure checked every year.  Maintain a healthy weight.  Reducing calorie intake and making food choices that are low in sodium, saturated fat, trans fat, and cholesterol are recommended to manage  weight.  Stop drug abuse.  Avoid taking birth control pills.  Talk to your health care provider about the risks of taking birth control pills if you are over 35 years old, smoke, get migraines, or have ever had a blood clot.  Get evaluated for sleep disorders (sleep apnea).  Talk to your health care provider about getting a sleep evaluation if you snore a lot or have excessive sleepiness.  Take medicines only as directed by your health care provider.  For some people, aspirin or blood thinners (anticoagulants) are helpful in reducing the risk of forming abnormal blood clots that can lead to stroke. If you have the irregular heart rhythm of atrial fibrillation, you should be on a blood thinner unless there is a good reason you cannot take them.  Understand all your medicine instructions.  Make sure that other conditions (such as anemia or atherosclerosis) are addressed. Get help right away if:  You have sudden weakness or numbness of the face, arm, or leg, especially on one side of the body.  Your face or eyelid droops to one side.  You have sudden confusion.  You have trouble speaking (aphasia) or understanding.  You have sudden trouble seeing in one or both eyes.  You have sudden trouble walking.  You have dizziness.  You have a loss of balance or coordination.  You have a sudden, severe headache with no known cause.  You have new chest pain or an irregular heartbeat. Any of these symptoms may represent a serious problem that is an emergency. Do not wait to see if the symptoms will go away.   Get medical help at once. Call your local emergency services (911 in [place] Do not drive yourself to the hospital. This information is not intended to replace advice given to you by your health care provider. Make sure you discuss any questions you have with your health care provider. Document Released: 03/24/2004 Document Revised: 07/23/2015 Document Reviewed: 08/17/2012 Elsevier  Interactive Patient Education  2017 Elsevier Inc.     Preventing Cerebrovascular Disease Arteries are blood vessels that carry blood that contains oxygen from the heart to all parts of the body. Cerebrovascular disease affects arteries that supply the brain. Any condition that blocks or disrupts blood flow to the brain can cause cerebrovascular disease. Brain cells that lose blood supply start to die within minutes (stroke). Stroke is the main danger of cerebrovascular disease. Atherosclerosis and high blood pressure are common causes of cerebrovascular disease. Atherosclerosis is narrowing and hardening of an artery that results when fat, cholesterol, calcium, or other substances (plaque) build up inside an artery. Plaque reduces blood flow through the artery. High blood pressure increases the risk of bleeding inside the brain. Making diet and lifestyle changes to prevent atherosclerosis and high blood pressure lowers your risk of cerebrovascular disease. What nutrition changes can be made?  Eat more fruits, vegetables, and whole grains.  Reduce how much saturated fat you eat. To do this, eat less red meat and fewer full-fat dairy products.  Eat healthy proteins instead of red meat. Healthy proteins include:  Fish. Eat fish that contains heart-healthy omega-3 fatty acids, twice a week. Examples include salmon, albacore tuna, mackerel, and herring.  Chicken.  Nuts.  Low-fat or nonfat yogurt.  Avoid processed meats, like bacon and lunchmeat.  Avoid foods that contain:  A lot of sugar, such as sweets and drinks with added sugar.  A lot of salt (sodium). Avoid adding extra salt to your food, as told by your health care provider.  Trans fats, such as margarine and baked goods. Trans fats may be listed as "partially hydrogenated oils" on food labels.  Check food labels to see how much sodium, sugar, and trans fats are in foods.  Use vegetable oils that contain low amounts of  saturated fat, such as olive oil or canola oil. What lifestyle changes can be made?  Drink alcohol in moderation. This means no more than 1 drink a day for nonpregnant women and 2 drinks a day for men. One drink equals 12 oz of beer, 5 oz of wine, or 1 oz of hard liquor.  If you are overweight, ask your health care provider to recommend a weight-loss plan for you. Losing 5-10 lb (2.2-4.5 kg) can reduce your risk of diabetes, atherosclerosis, and high blood pressure.  Exercise for 30?60 minutes on most days, or as much as told by your health care provider.  Do moderate-intensity exercise, such as brisk walking, bicycling, and water aerobics. Ask your health care provider which activities are safe for you.  Do not use any products that contain nicotine or tobacco, such as cigarettes and e-cigarettes. If you need help quitting, ask your health care provider. Why are these changes important? Making these changes lowers your risk of many diseases that can cause cerebrovascular disease and stroke. Stroke is a leading cause of death and disability. Making these changes also improves your overall health and quality of life. What can I do to lower my risk? The following factors make you more likely to develop cerebrovascular disease:  Being overweight.  Smoking.  Being physically inactive.    Eating a high-fat diet.  Having certain health conditions, such as:  Diabetes.  High blood pressure.  Heart disease.  Atherosclerosis.  High cholesterol.  Sickle cell disease. Talk with your health care provider about your risk for cerebrovascular disease. Work with your health care provider to control diseases that you have that may contribute to cerebrovascular disease. Your health care provider may prescribe medicines to help prevent major causes of cerebrovascular disease. Where to find more information: Learn more about preventing cerebrovascular disease from:  National Heart, Lung, and  Blood Institute: www.nhlbi.nih.gov/health/health-topics/topics/stroke  Centers for Disease Control and Prevention: cdc.gov/stroke/about.htm Summary  Cerebrovascular disease can lead to a stroke.  Atherosclerosis and high blood pressure are major causes of cerebrovascular disease.  Making diet and lifestyle changes can reduce your risk of cerebrovascular disease.  Work with your health care provider to get your risk factors under control to reduce your risk of cerebrovascular disease. This information is not intended to replace advice given to you by your health care provider. Make sure you discuss any questions you have with your health care provider. Document Released: 03/01/2015 Document Revised: 09/04/2015 Document Reviewed: 03/01/2015 Elsevier Interactive Patient Education  2017 Elsevier Inc.      What You Need to Know About Smokeless Tobacco Use Tobacco use is one of the leading causes of cancer and other chronic health problems. Smokeless tobacco is tobacco that is put directly into the mouth instead of being smoked. It may also be called chewing tobacco or snuff. Smokeless tobacco is made from the leaves of tobacco plants and it comes in several forms:  Loose, dry leaves, plugs, or twists.  Moist pouches.  Dissolving lozenges or strips. Chewing, sucking, or holding the tobacco in your mouth causes your mouth to make more saliva. The saliva mixes with the tobacco to make "tobacco juice" that is swallowed or spit out. How can smokeless tobacco affect me? Using smokeless tobacco:  Increases your risk of developing cancer. Smokeless tobacco contains at least 28 different types of cancer-causing chemicals (carcinogens).  Increases your chances of developing other [place]-term health problems, including high blood pressure, heart disease, stroke, and dental problems.  Can make you become addicted. Nicotine is one of the chemicals in tobacco. When you chew tobacco, you absorb  nicotine from the tobacco juice. This can make you feel more alert than usual.  Can cause problems with pregnancy. Pregnant women who use smokeless tobacco are more likely to miscarry or deliver a baby too early (premature delivery).  Can affect the appearance and health of your mouth. Using smokeless tobacco may cause bad breath, yellow-brown teeth, mouth sores, cracking and bleeding lips, gum recession, and lesions on the soft tissues of your mouth (leukoplakia). What are the benefits of not using smokeless tobacco? The benefits of not using smokeless tobacco include:  A healthy mind because:  You avoid addiction.  A healthy body because:  You avoid dental problems.  You promote healthy pregnancy.  You avoid [place]-term health problems.  A healthy wallet because:  You avoid costs of buying tobacco.  You avoid health care costs in the future.  A healthy family because:  You avoid accidental poisoning of children in your household. What can happen if I continue to use smokeless tobacco? If you continue to use smokeless tobacco, you will increase your risk for developing certain cancers. These include:  Tongue.  Lips, mouth, and gums.  Throat (esophagus) and voice box (larynx).  Stomach.  Pancreas.  Bladder.  Colon. [place]-term use   of smokeless tobacco can also lead to:  High blood pressure, heart disease, and stroke.  Gum disease, gum recession, and bone loss around the teeth.  Tooth decay. How do I quit using smokeless tobacco? Quitting the use of smokeless tobacco can be hard, but it can be done. Follow these steps:  Pick a date to quit. Set a date within the next two weeks. This gives you time to prepare.  Write down the reasons why you are quitting. Keep this list in places where you will see it often, such as on your bathroom mirror or in your car or wallet.  Identify the people, places, things, and activities that make you want to use tobacco (triggers)  and avoid them.  Get rid of any tobacco you have and remove any tobacco smells. To do this:  Throw away all containers of tobacco at home, at work, and in your car.  Throw away any other items that you use regularly when you chew tobacco.  Clean your car and make sure to remove all tobacco-related items.  Clean your home, including curtains and carpets.  Tell your family, friends, and coworkers that you are quitting. This can make quitting easier.  Ask your health care provider for help quitting smokeless tobacco. This may involve treatment. Find out what treatment options are covered by your health insurance.  Keep track of how many days have passed since you quit. Remembering how [place] and hard you have worked to quit can help you avoid using tobacco again. Where can I get support? Ask your health care provider if there is a local support group for quitting smokeless tobacco. Where can I get more information? You can learn more about the risks of using smokeless tobacco and the benefits of quitting from these sources:  National Cancer Institute: www.cancer.gov  American Cancer Society: www.cancer.org When should I seek medical care? Seek medical care if you have:  White or other discolored patches in your mouth.  Difficulty swallowing.  A change in your voice.  Unexplained weight loss.  Stomach pain, nausea, or vomiting. Summary  Smokeless tobacco contains at least 28 different chemicals that are known to cause cancer (carcinogen).  Nicotine is an addictive chemical in smokeless tobacco.  When you quit using smokeless tobacco, you lower your risk of developing cancer. This information is not intended to replace advice given to you by your health care provider. Make sure you discuss any questions you have with your health care provider. Document Released: 07/19/2010 Document Revised: 10/10/2015 Document Reviewed: 09/26/2014 Elsevier Interactive Patient Education  2017  Elsevier Inc.  

## 2016-07-07 NOTE — Progress Notes (Signed)
Chief Complaint: Follow up Extracranial Carotid Artery Stenosis   History of Present Illness  [name] is a 66 y.o. male patient of Dr. Donnetta Hutching who has a history of a right CEA with Dacron patch in 2007.  He returns today for follow up. He had 2 episodes of amaurosis fugax on one eye, he cannot remember which eye, before he had the CEA and has had no further episodes.  The patient denies a hx of unilateral facial drooping, hemiplegia, or receptive or expressive aphasia.  He has an esophogeal stricture which he will have dilated on 07-29-16  He is having a great deal of pain in his left hip which limits his walking. His local ortho referred him to [facility] which found a torn tendon; he is scheduled for surgery on this in September 2018.   He was very active with exercising until his left hip pain curtailed this, and thinks his calves are weaker due his lack of exercise.  He still plays golf 3-5 days/week and does yard work.   Pt Diabetic: No Pt smoker: former smoker, quit in 2007, using smokeless tobacco since 2011, snuff pouches  Pt meds include:  Statin : No: states he cannot take statins due to severe myalgias, has tried 3-4 different statins , he is taking an oral medication to improve cholesterol which is in trials.  He is still taking Crestor 5 mg weekly.  Betablocker: No  ASA: Yes  Other anticoagulants/antiplatelets: Plavix started after cardiac stent placed in June, 2017; prior to this he had some chest pain and dyspnea   Past Medical History:  Diagnosis Date  . Arthritis   . Carotid artery occlusion   . Coronary atherosclerosis of native coronary artery 01/29/2013  . Duodenal erosion   . H. pylori infection   . Heart attack Oct. 2009   Mild  . Hiatal hernia   . Hyperlipidemia   . Hypertension   . Shortness of breath dyspnea     Social History Social History  Substance Use Topics  . Smoking status: Former Smoker    Types: Cigarettes    Quit date:  02/28/2005  . Smokeless tobacco: Current User    Types: Snuff  . Alcohol use 3.0 - 3.6 oz/week    5 - 6 Glasses of wine per week    Family History Family History  Problem Relation Age of Onset  . Heart attack Mother   . Coronary artery disease Mother   . Heart disease Mother        Carotid Stenosis and BPG and Heart Disease before age 87  . Diabetes Mother   . Hypertension Mother   . Heart attack Father   . Heart disease Father        BPG and Heart Disease before age 85  . Hypertension Father   . Cancer Father 55       throat  . Colon cancer Neg Hx     Surgical History Past Surgical History:  Procedure Laterality Date  . APPENDECTOMY    . CARDIAC CATHETERIZATION N/A 08/07/2015   Procedure: Left Heart Cath and Coronary Angiography;  Surgeon: [name], MD;  Location: [facility];  Service: Cardiovascular;  Laterality: N/A;  . CARDIAC CATHETERIZATION N/A 08/07/2015   Procedure: Coronary Stent Intervention;  Surgeon: [name], MD;  Location: [facility];  Service: Cardiovascular;  Laterality: N/A;  . CARDIAC CATHETERIZATION N/A 08/07/2015   Procedure: Coronary Stent Intervention;  Surgeon: Ander Slade  Martin[place], MD;  Location: [facility];  Service: Cardiovascular;  Laterality: N/A;  . CAROTID ENDARTERECTOMY  01/05/2006   Right  CEA with DPA  . CORONARY STENT PLACEMENT  08/07/2015   [facility]  . SPINE SURGERY      Allergies  Allergen Reactions  . Brilinta [Ticagrelor]     Shortness of breath  . Statins Other (See Comments)    Failed Crestor 5 mg twice weekly, Crestor 20 mg daily, Pravastatin 40 mg qd, Lipitor, Zocor - muscle aches  . Zetia [Ezetimibe] Other (See Comments)    Muscle aches  . Amlodipine Other (See Comments)    Mood Swings Wife stated [place]ekyll & Hyde    Current Outpatient Prescriptions  Medication Sig Dispense Refill  . acetaminophen-codeine (TYLENOL #3) 300-30 MG tablet Take 1 tablet by mouth every 6 (six) hours as needed. For  pain  0  . AMBULATORY NON FORMULARY MEDICATION Take 180 mg by mouth daily. Medication Name: bempedoic acid vs placebo. CLEAR Research study drug provided    . amLODipine (NORVASC) 5 MG tablet Take 5 mg by mouth daily.   0  . aspirin EC 81 MG tablet Take 1 tablet (81 mg total) by mouth daily. 90 tablet 3  . celecoxib (CELEBREX) 200 MG capsule Take 1 capsule by mouth 3 (three) times a week.    . clopidogrel (PLAVIX) 75 MG tablet Take 1 tablet (75 mg total) by mouth daily. Take 300 mg for 1 day then 75 mg a day thereafter 90 tablet 2  . cyclobenzaprine (FLEXERIL) 10 MG tablet Take 1 tablet (10 mg total) by mouth 3 (three) times daily as needed for muscle spasms. 15 tablet 0  . diclofenac sodium (VOLTAREN) 1 % GEL Apply 1 application topically 2 (two) times daily as needed (For pain.).   0  . lisinopril (PRINIVIL,ZESTRIL) 10 MG tablet TAKE 1 TABLET BY MOUTH ONCE DAILY. 90 tablet 1  . metoprolol succinate (TOPROL-XL) 25 MG 24 hr tablet TAKE 1 TABLET BY MOUTH ONCE DAILY. 90 tablet 2  . nitroGLYCERIN (NITROSTAT) 0.4 MG SL tablet Place 1 tablet (0.4 mg total) under the tongue every 5 (five) minutes as needed for chest pain. 25 tablet 2  . pantoprazole (PROTONIX) 40 MG tablet Take 1 tablet (40 mg total) by mouth 2 (two) times daily. .Take one tablet twice daily for 2 months. 60 tablet 1  . rosuvastatin (CRESTOR) 5 MG tablet 5 mg. Patient takes 1 tablet by mouth 2 days a week Wed and Sunday.    . traMADol (ULTRAM) 50 MG tablet Take 50 mg by mouth every 6 (six) hours as needed for moderate pain.      Current Facility-Administered Medications  Medication Dose Route Frequency Provider Last Rate Last Dose  . 0.9 %  sodium chloride infusion  500 mL Intravenous Continuous Ladene Artist, MD        Review of Systems : See HPI for pertinent positives and negatives.  Physical Examination  Vitals:   07/07/16 0944 07/07/16 0946  BP: 140/85 138/87  Pulse: 68 70  Resp: 18   Temp: 97.2 F (36.2 C)    TempSrc: Oral   SpO2: 95%   Weight: 179 lb (81.2 kg)   Height: 5\' 6"  (1.676 m)    Body mass index is 28.89 kg/m.  General: WDWN male in NAD  GAIT:normal  Eyes: PERRLA  Pulmonary: Respirations are non labored, CTAB, no rales, rhonchi, or wheezing.  Cardiac: regular rhythm and rate, no murmur detected.  VASCULAR EXAM Carotid Bruits Left Right   Negative  Negative    Radial pulses: are 2+ palpable and = Aorta: is not palpable  LE Pulses  LEFT  RIGHT   POPLITEAL  not palpable  not palpable  POSTERIOR TIBIAL  Not palpable  Not palpable   DORSALIS PEDIS ANTERIOR TIBIAL  Faintly palpable  Faintly palpable    Gastrointestinal: soft, nontender, BS WNL, no r/g, no masses palpated.  Musculoskeletal: No muscle atrophy/wasting. M/S 5/5 throughout, Extremities without ischemic changes.  Neurologic: A&O X 3; Appropriate Affect, Speech is normal  CN 2-12 intact, Pain and light touch intact in extremities, Motor exam as listed above     Assessment: [name] is a 66 y.o. male who has a history of a right CEA with Dacron patch in 2007.  He had 2 episodes of amaurosis fugax in the same eye preoperatively, no TIA or stroke sx's subsequently.   His atherosclerotic risk factors include current use of smokeless tobacco, former smoker, CAD, and dyslipidemia.  The patient was counseled re tobacco cessation and given several free resources re tobacco cessation.   DATA Today's carotid duplex suggests a patent right CEA site with no restenosis, and 40-59% left ICA stenosis.  Bilateral vertebral artery flow is antegrade.  Bilateral subclavian artery waveforms are normal.  Velocities of the previous exam (left ICA) on 09-28-15 could not be replicated on today's exam.   Plan: Follow-up in 1 year with Carotid Duplex scan.    I discussed in depth with the patient the nature of atherosclerosis, and emphasized the importance of maximal medical  management including strict control of blood pressure, blood glucose, and lipid levels, obtaining regular exercise, and cessation of tobacco use.  The patient is aware that without maximal medical management the underlying atherosclerotic disease process will progress, limiting the benefit of any interventions. The patient was given information about stroke prevention and what symptoms should prompt the patient to seek immediate medical care. Thank you for allowing [place] to participate in this patient's care.  Clemon Chambers, RN, MSN, FNP-C [facility] Office: [facility] Physician: Oneida Alar  07/07/16 10:00 AM

## 2016-07-11 ENCOUNTER — Telehealth: Payer: Self-pay | Admitting: Family Medicine

## 2016-07-11 NOTE — Telephone Encounter (Signed)
Pt thinks labs are fasting but is  Unsure request nurse call him. --glh

## 2016-07-11 NOTE — Telephone Encounter (Signed)
Pt informed that his labs are to be fasting.  Pt expressed understanding.  Charyl Bigger, CMA

## 2016-07-13 ENCOUNTER — Ambulatory Visit (INDEPENDENT_AMBULATORY_CARE_PROVIDER_SITE_OTHER): Payer: PPO | Admitting: Family Medicine

## 2016-07-13 ENCOUNTER — Encounter: Payer: Self-pay | Admitting: Family Medicine

## 2016-07-13 VITALS — BP 121/74 | HR 61 | Ht 66.0 in | Wt 177.8 lb

## 2016-07-13 DIAGNOSIS — Z136 Encounter for screening for cardiovascular disorders: Secondary | ICD-10-CM

## 2016-07-13 DIAGNOSIS — Z122 Encounter for screening for malignant neoplasm of respiratory organs: Secondary | ICD-10-CM | POA: Diagnosis not present

## 2016-07-13 DIAGNOSIS — R1310 Dysphagia, unspecified: Secondary | ICD-10-CM

## 2016-07-13 DIAGNOSIS — E663 Overweight: Secondary | ICD-10-CM

## 2016-07-13 DIAGNOSIS — Z7189 Other specified counseling: Secondary | ICD-10-CM

## 2016-07-13 DIAGNOSIS — M25552 Pain in left hip: Secondary | ICD-10-CM

## 2016-07-13 DIAGNOSIS — M7062 Trochanteric bursitis, left hip: Secondary | ICD-10-CM

## 2016-07-13 DIAGNOSIS — Z1211 Encounter for screening for malignant neoplasm of colon: Secondary | ICD-10-CM | POA: Diagnosis not present

## 2016-07-13 DIAGNOSIS — Z7901 Long term (current) use of anticoagulants: Secondary | ICD-10-CM

## 2016-07-13 DIAGNOSIS — Z1389 Encounter for screening for other disorder: Secondary | ICD-10-CM | POA: Diagnosis not present

## 2016-07-13 DIAGNOSIS — I77811 Abdominal aortic ectasia: Secondary | ICD-10-CM | POA: Insufficient documentation

## 2016-07-13 DIAGNOSIS — E782 Mixed hyperlipidemia: Secondary | ICD-10-CM | POA: Diagnosis not present

## 2016-07-13 DIAGNOSIS — R1319 Other dysphagia: Secondary | ICD-10-CM

## 2016-07-13 DIAGNOSIS — Z87891 Personal history of nicotine dependence: Secondary | ICD-10-CM

## 2016-07-13 DIAGNOSIS — Z0000 Encounter for general adult medical examination without abnormal findings: Secondary | ICD-10-CM | POA: Diagnosis not present

## 2016-07-13 DIAGNOSIS — G8929 Other chronic pain: Secondary | ICD-10-CM

## 2016-07-13 DIAGNOSIS — Z9861 Coronary angioplasty status: Secondary | ICD-10-CM

## 2016-07-13 DIAGNOSIS — I2510 Atherosclerotic heart disease of native coronary artery without angina pectoris: Secondary | ICD-10-CM

## 2016-07-13 DIAGNOSIS — F17201 Nicotine dependence, unspecified, in remission: Secondary | ICD-10-CM | POA: Insufficient documentation

## 2016-07-13 HISTORY — DX: Encounter for screening for malignant neoplasm of respiratory organs: Z12.2

## 2016-07-13 LAB — POC HEMOCCULT BLD/STL (OFFICE/1-CARD/DIAGNOSTIC)
FECAL OCCULT BLD: NEGATIVE
OCCULT BLOOD DATE: 5162018

## 2016-07-13 NOTE — Assessment & Plan Note (Addendum)
former smoker (quit 2007) with 30 pack-year history of smoking.  HAD CT CHEST LUNG CA SCREENING 12/2013.It showed emphasema and rec annual ct scan low dose, non-contrast yrly.

## 2016-07-13 NOTE — Patient Instructions (Signed)

## 2016-07-13 NOTE — Assessment & Plan Note (Signed)
Chol mgt per Cards- told pt he would have to discuss any changes with his meds with them  Again strongly encouraged patient to drink half of his weight in ounces of water per day which he rarely drinks any.   Explained to patient this can have a significant impact on decreasing patient's severe muscle cramps and aches when on a statin.

## 2016-07-13 NOTE — Progress Notes (Signed)
Male physical  Impression and Recommendations:    1. Encounter for wellness examination   2. Screening for multiple conditions   3. Counseling on health promotion and disease prevention   4. History of smoking   5. Encounter for screening for lung cancer   6. Screening for colon cancer   7. Overweight (BMI 25.0-29.9)   8. Chronic left hip pain   9. Greater trochanteric bursitis of left hip   10. Chronic anticoagulation: plavix   11. Mixed hyperlipidemia   12. Screening for AAA (abdominal aortic aneurysm)   13. Dysphagia, unspecified type   14. Other dysphagia   15. CAD S/P CFX DES 08/07/15     Orders Placed This Encounter  Procedures  . CT CHEST LUNG CA SCREEN LOW DOSE W/O CM    179lbs/ no needs INS:  HTA / Epic Order JTB/Tyler   [phone]     Standing Status:   Future    Standing Expiration Date:   01/13/2017    Order Specific Question:   Reason for Exam (SYMPTOM  OR DIAGNOSIS REQUIRED)    Answer:   lung cancer screening due to 30pk yr hx - quit in 2007; prior ct lung screenign in 2015    Order Specific Question:   Preferred Imaging Location?    Answer:   [facility]  . Comprehensive metabolic panel  . CBC with Differential/Platelet  . Hemoglobin A1c  . VITAMIN D 25 Hydroxy (Vit-D Deficiency, Fractures)  . TSH  . T4, free  . POC Hemoccult Bld/Stl (1-Cd Office Dx)    History of smoking former smoker (quit 2007) with 30 pack-year history of smoking.  HAD CT CHEST LUNG CA SCREENING 12/2013.It showed emphasema and rec annual ct scan low dose, non-contrast yrly.  Difficulty swallowing Also discussed his recent upper endoscopy- found to have narrowing of esophagus but due to being on plavix, no procedure could be done at that time.  - getting it done June 1st, 2018  Hyperlipidemia Chol mgt per Cards- told pt he would have to discuss any changes with his meds with them  Again strongly encouraged patient to drink half of his weight in ounces of water per  day which he rarely drinks any.   Explained to patient this can have a significant impact on decreasing patient's severe muscle cramps and aches when on a statin.  Chronic anticoagulation: plavix Pt with questions about his plavix and if he can come off it--> told him he woul dhave to address this w his Cards doc  Chronic left hip pain [place] ortho Dr Alvan Dame prior, and now seeing [facility] Doc =- Dr Trude Mcburney.  [facility] doc saw torn tendon on MRI done 12-72mo ago.  Pt scheduled for sx this summer.    Explained to him not a candidate for any injections at all with new findings  Greater trochanteric bursitis of left hip ice    Patient's Medications  New Prescriptions   No medications on file  Previous Medications   ACETAMINOPHEN-CODEINE (TYLENOL #3) 300-30 MG TABLET    Take 1 tablet by mouth every 6 (six) hours as needed. For pain   AMBULATORY NON FORMULARY MEDICATION    Take 180 mg by mouth daily. Medication Name: bempedoic acid vs placebo. CLEAR Research study drug provided   AMLODIPINE (NORVASC) 5 MG TABLET    Take 5 mg by mouth daily.    ASPIRIN EC 81 MG TABLET    Take 1 tablet (81 mg total) by mouth daily.  CELECOXIB (CELEBREX) 200 MG CAPSULE    Take 1 capsule by mouth 3 (three) times a week.   CLOPIDOGREL (PLAVIX) 75 MG TABLET    Take 1 tablet (75 mg total) by mouth daily. Take 300 mg for 1 day then 75 mg a day thereafter   CYCLOBENZAPRINE (FLEXERIL) 10 MG TABLET    Take 1 tablet (10 mg total) by mouth 3 (three) times daily as needed for muscle spasms.   DICLOFENAC SODIUM (VOLTAREN) 1 % GEL    Apply 1 application topically 2 (two) times daily as needed (For pain.).    LISINOPRIL (PRINIVIL,ZESTRIL) 10 MG TABLET    TAKE 1 TABLET BY MOUTH ONCE DAILY.   METOPROLOL SUCCINATE (TOPROL-XL) 25 MG 24 HR TABLET    TAKE 1 TABLET BY MOUTH ONCE DAILY.   NITROGLYCERIN (NITROSTAT) 0.4 MG SL TABLET    Place 1 tablet (0.4 mg total) under the tongue every 5 (five) minutes as needed for chest pain.   ROSUVASTATIN  (CRESTOR) 10 MG TABLET    Take 10 mg by mouth once a week.   TRAMADOL (ULTRAM) 50 MG TABLET    Take 50 mg by mouth every 6 (six) hours as needed for moderate pain.   Modified Medications   No medications on file  Discontinued Medications   PANTOPRAZOLE (PROTONIX) 40 MG TABLET    Take 1 tablet (40 mg total) by mouth 2 (two) times daily. .Take one tablet twice daily for 2 months.   ROSUVASTATIN (CRESTOR) 5 MG TABLET    5 mg. Patient takes 1 tablet by mouth 2 days a week Wed and Sunday.     Please see AVS handed out to patient at the end of our visit for further patient instructions/ counseling done pertaining to today's office visit.  1) Anticipatory Guidance: Discussed importance of wearing a seatbelt while driving, not texting while driving;   sunscreen when outside along with skin surveillance; eating a balanced and modest diet; physical activity at least 25 minutes per day or 150 min/ week moderate to intense activity.  2) Immunizations / Screenings / Labs:  All immunizations are up-to-date per recommendations or will be updated today. Patient is due for dental and vision screens which pt will schedule independently.  3) Weight:  BMI meaning discussed with patient.  Discussed goal of losing 5-10% of current body weight which would improve overall feelings of well being and improve objective health data. Improve nutrient density of diet through increasing intake of fruits and vegetables and decreasing saturated fats, white flour products and refined sugars.    Gross side effects, risk and benefits, and alternatives of medications discussed with patient.  Patient is aware that all medications have potential side effects and we are unable to predict every side effect or drug-drug interaction that may occur.  Expresses verbal understanding and consents to current therapy plan and treatment regimen.  Follow-up preventative CPE in 1 year. Follow-up office visit pending lab work.  F/up sooner for  chronic care management and/or prn    Subjective:    CC: CPE  HPI: [name] is a 66 y.o. male who presents to [place] at [facility] today for a yearly health maintenance exam.     [facility]--> found hip tendon tear on MRI done at [place].  Initiialy went to Dr Alvan Dame and has had mulitple injections of greater troch bursa, includign one here. The tear was never found on MRI, nor by [place] prior.  Review of pt's  [facility] records show he will be having repair of this tendon-Sept 24th-- Dr Trude Mcburney.   Upper endoscopy--> narrowing of esophagus-->  getting it stretched July 29, 2016.   Went to [facility] couple months ago--> seen local doc- got skin screening. Pt will call with Doc's name  Pt in a study through [facility] cards for Cholesterol mgt--->  been on drug for 6-8 months thru his Cards doc.    They monitor chol regularly- q 61mo.      Health Maintenance Summary Reviewed and updated, unless pt declines services.  Aspirin: administering 81 mg daily Colonoscopy:    UTD Tdap: Up to date:  HEP- C- I found records in chart of recent labs that were negative Tobacco History Reviewed:   updated CT scan for screening lung CA:   Reviewed tests and pt lost to f/up---> order placed today Abdominal Ultrasound:     Done- negative Alcohol:    No concerns, no excessive use Exercise Habits:   Not meeting AHA goals STD concerns:   none Drug Use:   None Birth control method:   n/a Testicular/penile concerns:     None; no urinary sx / concerns      Wt Readings from Last 3 Encounters:  07/13/16 177 lb 12.8 oz (80.6 kg)  07/07/16 179 lb (81.2 kg)  06/21/16 183 lb 9.6 oz (83.3 kg)   BP Readings from Last 3 Encounters:  07/13/16 121/74  07/07/16 138/87  06/21/16 129/83   Pulse Readings from Last 3 Encounters:  07/13/16 61  07/07/16 70  06/21/16 67    Patient Active Problem List   Diagnosis Date Noted  . Chronic anticoagulation: plavix 10/15/2015    Priority: High  . CAD S/P CFX DES  08/07/15 08/08/2015    Priority: High  . Essential hypertension 08/08/2015    Priority: High  . Hyperlipidemia 01/29/2013    Priority: High  . Carotid stenosis-s/p RCE 11/08/2011    Priority: High  . Chronic left hip pain 09/15/2015    Priority: Medium  . Greater trochanteric bursitis of left hip 09/15/2015    Priority: Medium  . History of smoking 07/13/2016  . Overweight (BMI 25.0-29.9) 07/13/2016  . Counseling on health promotion and disease prevention 07/13/2016  . Encounter for screening for lung cancer 07/13/2016  . Screening for colon cancer 07/13/2016  . Screening for AAA (abdominal aortic aneurysm) 07/13/2016  . Difficulty swallowing 11/26/2015  . Dysphagia 11/26/2015  . Abnormal nuclear cardiac imaging test 08/08/2015  . H/O fatigue with exertion   . Coronary atherosclerosis of native coronary artery 01/29/2013  . Occlusion and stenosis of carotid artery without mention of cerebral infarction 11/13/2012  . Old myocardial infarction 11/29/2007    Past Medical History:  Diagnosis Date  . Arthritis   . Carotid artery occlusion   . Coronary atherosclerosis of native coronary artery 01/29/2013  . Duodenal erosion   . H. pylori infection   . Heart attack [facility]) Oct. 2009   Mild  . Hiatal hernia   . Hyperlipidemia   . Hypertension   . Shortness of breath dyspnea     Past Surgical History:  Procedure Laterality Date  . APPENDECTOMY    . CARDIAC CATHETERIZATION N/A 08/07/2015   Procedure: Left Heart Cath and Coronary Angiography;  Surgeon: [name], MD;  Location: [place] CV LAB;  Service: Cardiovascular;  Laterality: N/A;  . CARDIAC CATHETERIZATION N/A 08/07/2015   Procedure: Coronary Stent Intervention;  Surgeon: [name], MD;  Location: [facility] INVASIVE CV  LAB;  Service: Cardiovascular;  Laterality: N/A;  . CARDIAC CATHETERIZATION N/A 08/07/2015   Procedure: Coronary Stent Intervention;  Surgeon: [name], MD;  Location: [place] CV LAB;  Service:  Cardiovascular;  Laterality: N/A;  . CAROTID ENDARTERECTOMY  01/05/2006   Right  CEA with DPA  . CORONARY STENT PLACEMENT  08/07/2015   [facility] [place]  . SPINE SURGERY      Family History  Problem Relation Age of Onset  . Heart attack Mother   . Coronary artery disease Mother   . Heart disease Mother        Carotid Stenosis and BPG and Heart Disease before age 102  . Diabetes Mother   . Hypertension Mother   . Heart attack Father   . Heart disease Father        BPG and Heart Disease before age 75  . Hypertension Father   . Cancer Father 55       throat  . Colon cancer Neg Hx     History  Drug Use No  ,  History  Alcohol Use  . 3.0 - 3.6 oz/week  . 5 - 6 Glasses of wine per week  ,  History  Smoking Status  . Former Smoker  . Types: Cigarettes  . Quit date: 02/28/2005  Smokeless Tobacco  . Current User  . Types: Snuff  ,  History  Sexual Activity  . Sexual activity: Not on file    Patient's Medications  New Prescriptions   No medications on file  Previous Medications   ACETAMINOPHEN-CODEINE (TYLENOL #3) 300-30 MG TABLET    Take 1 tablet by mouth every 6 (six) hours as needed. For pain   AMBULATORY NON FORMULARY MEDICATION    Take 180 mg by mouth daily. Medication Name: bempedoic acid vs placebo. CLEAR Research study drug provided   AMLODIPINE (NORVASC) 5 MG TABLET    Take 5 mg by mouth daily.    ASPIRIN EC 81 MG TABLET    Take 1 tablet (81 mg total) by mouth daily.   CELECOXIB (CELEBREX) 200 MG CAPSULE    Take 1 capsule by mouth 3 (three) times a week.   CLOPIDOGREL (PLAVIX) 75 MG TABLET    Take 1 tablet (75 mg total) by mouth daily. Take 300 mg for 1 day then 75 mg a day thereafter   CYCLOBENZAPRINE (FLEXERIL) 10 MG TABLET    Take 1 tablet (10 mg total) by mouth 3 (three) times daily as needed for muscle spasms.   DICLOFENAC SODIUM (VOLTAREN) 1 % GEL    Apply 1 application topically 2 (two) times daily as needed (For pain.).    LISINOPRIL (PRINIVIL,ZESTRIL) 10  MG TABLET    TAKE 1 TABLET BY MOUTH ONCE DAILY.   METOPROLOL SUCCINATE (TOPROL-XL) 25 MG 24 HR TABLET    TAKE 1 TABLET BY MOUTH ONCE DAILY.   NITROGLYCERIN (NITROSTAT) 0.4 MG SL TABLET    Place 1 tablet (0.4 mg total) under the tongue every 5 (five) minutes as needed for chest pain.   ROSUVASTATIN (CRESTOR) 10 MG TABLET    Take 10 mg by mouth once a week.   TRAMADOL (ULTRAM) 50 MG TABLET    Take 50 mg by mouth every 6 (six) hours as needed for moderate pain.   Modified Medications   No medications on file  Discontinued Medications   PANTOPRAZOLE (PROTONIX) 40 MG TABLET    Take 1 tablet (40 mg total) by mouth 2 (two) times daily. .Take one  tablet twice daily for 2 months.   ROSUVASTATIN (CRESTOR) 5 MG TABLET    5 mg. Patient takes 1 tablet by mouth 2 days a week Wed and Sunday.    Brilinta [ticagrelor]; Statins; Zetia [ezetimibe]; and Amlodipine  Review of Systems: General:   Denies fever, chills, unexplained weight loss.  Optho/Auditory:   Denies visual changes, blurred vision/LOV Respiratory:   Denies SOB, DOE more than baseline levels.  Cardiovascular:   Denies chest pain, palpitations, new onset peripheral edema  Gastrointestinal:   Denies nausea, vomiting, diarrhea.  Genitourinary: Denies dysuria, freq/ urgency, flank pain or discharge from genitals.  Endocrine:     Denies hot or cold intolerance, polyuria, polydipsia. Musculoskeletal:   Denies unexplained myalgias, joint swelling, unexplained arthralgias, gait problems.  Skin:  Denies rash, suspicious lesions Neurological:     Denies dizziness, unexplained weakness, numbness  Psychiatric/Behavioral:   Denies mood changes, suicidal or homicidal ideations, hallucinations    Objective:     Blood pressure 121/74, pulse 61, height 5\' 6"  (1.676 m), weight 177 lb 12.8 oz (80.6 kg). Body mass index is 28.7 kg/m. General Appearance:    Alert, cooperative, no distress, appears stated age  Head:    Normocephalic, without obvious  abnormality, atraumatic  Eyes:    PERRL, conjunctiva/corneas clear, EOM's intact, fundi    benign, both eyes  Ears:    Normal TM's and external ear canals, both ears  Nose:   Nares normal, septum midline, mucosa normal, no drainage    or sinus tenderness  Throat:   Lips w/o lesion, mucosa moist, and tongue normal; teeth and   gums normal  Neck:   Supple, symmetrical, trachea midline, no adenopathy;    thyroid:  no enlargement/tenderness/nodules; no carotid   bruit or JVD  Back:     Symmetric, no curvature, ROM normal, no CVA tenderness  Lungs:     Clear to auscultation bilaterally, respirations unlabored, occ crackles at bases b/l;  no Wh/ R/ R  Chest Wall:    No tenderness or gross deformity; normal excursion   Heart:    Regular rate and rhythm, S1 and S2 normal, no murmur, rub   or gallop  Abdomen:     Soft, non-tender, bowel sounds active all four quadrants  Genitalia:    Ext genitalia: without lesion, no penile rash or discharge, no hernias appreciated   Rectal:    Normal tone, prostate WNL's and equal b/l, no tenderness; guaiac negative stool  Extremities:   Extremities normal, atraumatic, no cyanosis or gross edema  Pulses:   2+ and symmetric all extremities  Skin:   Warm, dry, Skin color, texture, turgor normal, no obvious rashes or lesions  M-Sk:   Ambulates * 4 w/o difficulty, no gross deformities, tone WNL  Neurologic:   CNII-XII intact, normal strength, sensation and reflexes    Throughout Psych:  No HI/SI, judgement and insight good, Euthymic mood. Full Affect.

## 2016-07-13 NOTE — Assessment & Plan Note (Signed)
ice

## 2016-07-13 NOTE — Assessment & Plan Note (Signed)
[place] ortho Dr Alvan Dame prior, and now seeing [facility] Doc =- Dr Trude Mcburney.  [facility] doc saw torn tendon on MRI done 12-37mo ago.  Pt scheduled for sx this summer.    Explained to him not a candidate for any injections at all with new findings

## 2016-07-13 NOTE — Assessment & Plan Note (Signed)
Pt with questions about his plavix and if he can come off it--> told him he woul dhave to address this w his Cards doc

## 2016-07-13 NOTE — Assessment & Plan Note (Signed)
Also discussed his recent upper endoscopy- found to have narrowing of esophagus but due to being on plavix, no procedure could be done at that time.  - getting it done June 1st, 2018

## 2016-07-13 NOTE — Assessment & Plan Note (Deleted)
Pt with questions about his plavix and if he can come off it--> told him he woul dhave to address this w his Cards doc

## 2016-07-14 LAB — COMPREHENSIVE METABOLIC PANEL
ALBUMIN: 4.8 g/dL (ref 3.6–4.8)
ALT: 33 IU/L (ref 0–44)
AST: 26 IU/L (ref 0–40)
Albumin/Globulin Ratio: 1.7 (ref 1.2–2.2)
Alkaline Phosphatase: 61 IU/L (ref 39–117)
BILIRUBIN TOTAL: 0.6 mg/dL (ref 0.0–1.2)
BUN / CREAT RATIO: 32 — AB (ref 10–24)
BUN: 27 mg/dL (ref 8–27)
CHLORIDE: 100 mmol/L (ref 96–106)
CO2: 25 mmol/L (ref 18–29)
Calcium: 9.8 mg/dL (ref 8.6–10.2)
Creatinine, Ser: 0.85 mg/dL (ref 0.76–1.27)
GFR, EST AFRICAN AMERICAN: 105 mL/min/{1.73_m2} (ref >59)
GFR, EST NON AFRICAN AMERICAN: 91 mL/min/{1.73_m2} (ref >59)
GLUCOSE: 96 mg/dL (ref 65–99)
Globulin, Total: 2.9 g/dL (ref 1.5–4.5)
Potassium: 5.2 mmol/L (ref 3.5–5.2)
Sodium: 138 mmol/L (ref 134–144)
TOTAL PROTEIN: 7.7 g/dL (ref 6.0–8.5)

## 2016-07-14 LAB — CBC WITH DIFFERENTIAL/PLATELET
BASOS ABS: 0 10*3/uL (ref 0.0–0.2)
BASOS: 1 %
EOS (ABSOLUTE): 0.2 10*3/uL (ref 0.0–0.4)
Eos: 3 %
HEMOGLOBIN: 14.9 g/dL (ref 13.0–17.7)
Hematocrit: 43.7 % (ref 37.5–51.0)
Immature Grans (Abs): 0 10*3/uL (ref 0.0–0.1)
Immature Granulocytes: 1 %
LYMPHS ABS: 1.6 10*3/uL (ref 0.7–3.1)
Lymphs: 27 %
MCH: 31.5 pg (ref 26.6–33.0)
MCHC: 34.1 g/dL (ref 31.5–35.7)
MCV: 92 fL (ref 79–97)
MONOCYTES: 7 %
Monocytes Absolute: 0.4 10*3/uL (ref 0.1–0.9)
NEUTROS ABS: 3.6 10*3/uL (ref 1.4–7.0)
Neutrophils: 61 %
Platelets: 237 10*3/uL (ref 150–379)
RBC: 4.73 x10E6/uL (ref 4.14–5.80)
RDW: 13.3 % (ref 12.3–15.4)
WBC: 5.9 10*3/uL (ref 3.4–10.8)

## 2016-07-14 LAB — HEMOGLOBIN A1C
Est. average glucose Bld gHb Est-mCnc: 114 mg/dL
Hgb A1c MFr Bld: 5.6 % (ref 4.8–5.6)

## 2016-07-14 LAB — TSH: TSH: 0.971 u[IU]/mL (ref 0.450–4.500)

## 2016-07-14 LAB — VITAMIN D 25 HYDROXY (VIT D DEFICIENCY, FRACTURES): Vit D, 25-Hydroxy: 36.6 ng/mL (ref 30.0–100.0)

## 2016-07-14 LAB — T4, FREE: FREE T4: 1.34 ng/dL (ref 0.82–1.77)

## 2016-07-15 NOTE — Addendum Note (Signed)
Addended by: Lianne Cure A on: 07/15/2016 09:46 AM   Modules accepted: Orders

## 2016-07-18 ENCOUNTER — Encounter: Payer: Self-pay | Admitting: Gastroenterology

## 2016-07-18 ENCOUNTER — Other Ambulatory Visit (INDEPENDENT_AMBULATORY_CARE_PROVIDER_SITE_OTHER): Payer: PPO

## 2016-07-18 DIAGNOSIS — Z1211 Encounter for screening for malignant neoplasm of colon: Secondary | ICD-10-CM | POA: Diagnosis not present

## 2016-07-18 LAB — POC HEMOCCULT BLD/STL (HOME/3-CARD/SCREEN)
Card #3 Fecal Occult Blood, POC: NEGATIVE
FECAL OCCULT BLD: NEGATIVE
Fecal Occult Blood, POC: NEGATIVE

## 2016-07-19 ENCOUNTER — Ambulatory Visit: Payer: PPO

## 2016-07-21 ENCOUNTER — Other Ambulatory Visit: Payer: Self-pay | Admitting: Family Medicine

## 2016-07-26 ENCOUNTER — Ambulatory Visit
Admission: RE | Admit: 2016-07-26 | Discharge: 2016-07-26 | Disposition: A | Discharge status: Home / Self Care | Payer: PPO | Source: Ambulatory Visit | Attending: Family Medicine | Admitting: Family Medicine

## 2016-07-26 DIAGNOSIS — Z122 Encounter for screening for malignant neoplasm of respiratory organs: Secondary | ICD-10-CM

## 2016-07-26 DIAGNOSIS — Z1389 Encounter for screening for other disorder: Secondary | ICD-10-CM

## 2016-07-26 DIAGNOSIS — Z7189 Other specified counseling: Secondary | ICD-10-CM

## 2016-07-26 DIAGNOSIS — Z87891 Personal history of nicotine dependence: Secondary | ICD-10-CM | POA: Diagnosis not present

## 2016-07-29 ENCOUNTER — Ambulatory Visit (AMBULATORY_SURGERY_CENTER): Payer: PPO | Admitting: Gastroenterology

## 2016-07-29 ENCOUNTER — Encounter: Payer: Self-pay | Admitting: Gastroenterology

## 2016-07-29 VITALS — BP 126/83 | HR 61 | Temp 97.5°F | Resp 15 | Ht 65.0 in | Wt 183.0 lb

## 2016-07-29 DIAGNOSIS — K222 Esophageal obstruction: Secondary | ICD-10-CM | POA: Diagnosis not present

## 2016-07-29 DIAGNOSIS — R1319 Other dysphagia: Secondary | ICD-10-CM

## 2016-07-29 DIAGNOSIS — R1310 Dysphagia, unspecified: Secondary | ICD-10-CM

## 2016-07-29 MED ORDER — SODIUM CHLORIDE 0.9 % IV SOLN: 500.0000 mL | INTRAVENOUS | Status: DC

## 2016-07-29 MED ORDER — PANTOPRAZOLE SODIUM 40 MG PO TBEC: 40.0000 mg | DELAYED_RELEASE_TABLET | Freq: Every day | ORAL | 3 refills | Status: DC

## 2016-07-29 NOTE — Progress Notes (Signed)
Called to room to assist during endoscopic procedure.  Patient ID and intended procedure confirmed with present staff. Received instructions for my participation in the procedure from the performing physician.  

## 2016-07-29 NOTE — Progress Notes (Signed)
Pt's states no medical or surgical changes since previsit or office visit. 

## 2016-07-29 NOTE — Progress Notes (Signed)
Teeth unchanged after procedure.A and O x3. Report to RN. Tolerated MAC anesthesia well.

## 2016-07-29 NOTE — Op Note (Signed)
[facility] Patient Name: [name] Procedure Date: 07/29/2016 7:54 AM MRN: [id number] Endoscopist: Ladene Artist , MD Age: 66 Referring MD:  Date of Birth: 09-19-1950 Gender: Male Account #: [id number] Procedure:                Upper GI endoscopy Indications:              Therapeutic procedure, Dysphagia Medicines:                Monitored Anesthesia Care Procedure:                Pre-Anesthesia Assessment:                           - Prior to the procedure, a History and Physical                            was performed, and patient medications and                            allergies were reviewed. The patient's tolerance of                            previous anesthesia was also reviewed. The risks                            and benefits of the procedure and the sedation                            options and risks were discussed with the patient.                            All questions were answered, and informed consent                            was obtained. Prior Anticoagulants: The patient has                            taken Plavix (clopidogrel), last dose was 6 days                            prior to procedure. ASA Grade Assessment: II - A                            patient with mild systemic disease. After reviewing                            the risks and benefits, the patient was deemed in                            satisfactory condition to undergo the procedure.                           After obtaining informed consent, the endoscope was  passed under direct vision. Throughout the                            procedure, the patient's blood pressure, pulse, and                            oxygen saturations were monitored continuously. The                            Model GIF-[id number]) scope was introduced                            through the mouth, and advanced to the second part                            of duodenum.  The upper GI endoscopy was                            accomplished without difficulty. The patient                            tolerated the procedure well. Scope In: Scope Out: Findings:                 LA Grade B (one or more mucosal breaks greater than                            5 mm, not extending between the tops of two mucosal                            folds) esophagitis with no bleeding was found in                            the distal esophagus.                           One moderate benign-appearing, intrinsic stenosis                            was found at the gastroesophageal junction. This                            measured 1.2 cm (inner diameter) and was traversed.                            A guidewire was placed and the scope was withdrawn.                            Dilations were performed with Savary dilators with                            mild resistance at 13 mm, 14 mm , 15 mm. Estimated  blood loss: none.                           The exam of the esophagus was otherwise normal.                           The duodenal bulb and second portion of the                            duodenum were normal.                           A few localized, small non-bleeding erosions were                            found in the prepyloric region of the stomach.                            There were no stigmata of recent bleeding.                           The exam of the stomach was otherwise normal. Complications:            No immediate complications. Estimated Blood Loss:     Estimated blood loss: none. Impression:               - LA Grade B reflux esophagitis.                           - Benign-appearing esophageal stenosis. Dilated.                           - Normal stomach.                           - Normal duodenal bulb and second portion of the                            duodenum.                           - No specimens  collected. Recommendation:           - Patient has a contact number available for                            emergencies. The signs and symptoms of potential                            delayed complications were discussed with the                            patient. Return to normal activities tomorrow.                            Written discharge instructions were provided to the  patient.                           - Clear liquid diet for 2 hours, then advance as                            tolerated to soft diet today. Resume prior diet                            tomorrow.                           - Follow antireflux measures.                           - Continue present medications.                           - Return to GI office in 6 weeks.                           - Protonix (pantoprazole) 40 mg PO daily                            indefinitely, 1 year of refills.                           - Resume Plavix (clopidogrel) at prior dose                            tomorrow. Refer to managing physician for further                            adjustment of therapy. Ladene Artist, MD 07/29/2016 8:20:13 AM This report has been signed electronically.

## 2016-07-29 NOTE — Patient Instructions (Signed)
Impression/Recommendations:  Esophagitis handout given to patient. Handout including antireflux measures given to patneit. Dilation diet handout given to patient.  Return to GI office in 6 weeks.  Protonix (pantoprazole) 40 mg by mouth once daily.  Resume Plavix (clopidogrel) at prior dose tomorrow.  YOU HAD AN ENDOSCOPIC PROCEDURE TODAY AT [facility]:   Refer to the procedure report that was given to you for any specific questions about what was found during the examination.  If the procedure report does not answer your questions, please call your gastroenterologist to clarify.  If you requested that your care partner not be given the details of your procedure findings, then the procedure report has been included in a sealed envelope for you to review at your convenience later.  YOU SHOULD EXPECT: Some feelings of bloating in the abdomen. Passage of more gas than usual.  Walking can help get rid of the air that was put into your GI tract during the procedure and reduce the bloating. If you had a lower endoscopy (such as a colonoscopy or flexible sigmoidoscopy) you may notice spotting of blood in your stool or on the toilet paper. If you underwent a bowel prep for your procedure, you may not have a normal bowel movement for a few days.  Please Note:  You might notice some irritation and congestion in your nose or some drainage.  This is from the oxygen used during your procedure.  There is no need for concern and it should clear up in a day or so.  SYMPTOMS TO REPORT IMMEDIATELY:  Following upper endoscopy (EGD)  Vomiting of blood or coffee ground material  New chest pain or pain under the shoulder blades  Painful or persistently difficult swallowing  New shortness of breath  Fever of 100F or higher  Black, tarry-looking stools  For urgent or emergent issues, a gastroenterologist can be reached at any hour by calling [phone].   DIET:  We do recommend a small  meal at first, but then you may proceed to your regular diet.  Drink plenty of fluids but you should avoid alcoholic beverages for 24 hours.  ACTIVITY:  You should plan to take it easy for the rest of today and you should NOT DRIVE or use heavy machinery until tomorrow (because of the sedation medicines used during the test).    FOLLOW UP: Our staff will call the number listed on your records the next business day following your procedure to check on you and address any questions or concerns that you may have regarding the information given to you following your procedure. If we do not reach you, we will leave a message.  However, if you are feeling well and you are not experiencing any problems, there is no need to return our call.  We will assume that you have returned to your regular daily activities without incident.  If any biopsies were taken you will be contacted by phone or by letter within the next 1-3 weeks.  Please call us at [phone] if you have not heard about the biopsies in 3 weeks.    SIGNATURES/CONFIDENTIALITY: You and/or your care partner have signed paperwork which will be entered into your electronic medical record.  These signatures attest to the fact that that the information above on your After Visit Summary has been reviewed and is understood.  Full responsibility of the confidentiality of this discharge information lies with you and/or your care-partner.

## 2016-08-01 ENCOUNTER — Telehealth: Payer: Self-pay | Admitting: *Deleted

## 2016-08-01 NOTE — Telephone Encounter (Signed)
  Follow up Call-  Call back number 07/29/2016 01/13/2016  Post procedure Call Back phone  # [phone]  Permission to leave phone message Yes Yes  Some recent data might be hidden     Patient questions:  Do you have a fever, pain , or abdominal swelling? No. Pain Score  0 *  Have you tolerated food without any problems? Yes.    Have you been able to return to your normal activities? Yes.    Do you have any questions about your discharge instructions: Diet   No. Medications  No. Follow up visit  No.  Do you have questions or concerns about your Care? No.  Actions: * If pain score is 4 or above: No action needed, pain <4.

## 2016-08-03 ENCOUNTER — Telehealth: Payer: Self-pay | Admitting: Cardiology

## 2016-08-03 NOTE — Telephone Encounter (Signed)
Pt aware OK to stop Plavix but to continue the ASA lifelong.  He was very thankful for the t/c.

## 2016-08-03 NOTE — Telephone Encounter (Signed)
Since it has been 1 year since DES placement to the circumflex, yes he may come off of his Plavix and continue aspirin lifelong. Candee Furbish, MD

## 2016-08-03 NOTE — Telephone Encounter (Signed)
Pt calling requesting to know if and when he can stop Plavix for good.  Complaints of having frequent bruising. Advised I will forward this to Dr Marlou Porch for review and any orders.  He also wants to makes [place] aware he will be having hip surgery at [facility] on 11/21/2016 and will need to be off the plavix for about 1 week prior to it.  Advised he will need to have the surgeon contact us in writing about any clearance needed.  He states understanding.

## 2016-08-03 NOTE — Telephone Encounter (Signed)
New message    Pt is calling asking if he can get off of his plavix. He is asking for a call back.

## 2016-08-16 DIAGNOSIS — M9904 Segmental and somatic dysfunction of sacral region: Secondary | ICD-10-CM | POA: Diagnosis not present

## 2016-08-16 DIAGNOSIS — M5136 Other intervertebral disc degeneration, lumbar region: Secondary | ICD-10-CM | POA: Diagnosis not present

## 2016-08-16 DIAGNOSIS — M9905 Segmental and somatic dysfunction of pelvic region: Secondary | ICD-10-CM | POA: Diagnosis not present

## 2016-08-16 DIAGNOSIS — M9903 Segmental and somatic dysfunction of lumbar region: Secondary | ICD-10-CM | POA: Diagnosis not present

## 2016-09-13 DIAGNOSIS — M67952 Unspecified disorder of synovium and tendon, left thigh: Secondary | ICD-10-CM | POA: Diagnosis not present

## 2016-09-13 DIAGNOSIS — M25552 Pain in left hip: Secondary | ICD-10-CM | POA: Diagnosis not present

## 2016-09-15 ENCOUNTER — Telehealth: Payer: Self-pay

## 2016-09-15 NOTE — Telephone Encounter (Signed)
If this is not in the chart I cannot refill we'll have to discus with the patien why he's on it.  Otherwise he can get it from the other provider he used o get up from- ie- sports med or ortho if applicable

## 2016-09-15 NOTE — Telephone Encounter (Signed)
Pharmacy sent refill request for meloxicam 7.5mg  - I did not see medication in the chart please advise. MPulliam, CMA/RT(R)

## 2016-09-16 NOTE — Telephone Encounter (Signed)
Called patient left message to call the office. MPulliam, CMA/RT(R)  

## 2016-09-16 NOTE — Telephone Encounter (Signed)
Patient called back - he will discuss medication with Dr Raliegh Scarlet at his follow up appointment. MPulliam, CMA/RT(R)

## 2016-09-20 ENCOUNTER — Ambulatory Visit (INDEPENDENT_AMBULATORY_CARE_PROVIDER_SITE_OTHER): Payer: PPO | Admitting: Family Medicine

## 2016-09-20 ENCOUNTER — Encounter: Payer: Self-pay | Admitting: Family Medicine

## 2016-09-20 VITALS — BP 111/70 | HR 65 | Ht 66.0 in | Wt 182.0 lb

## 2016-09-20 DIAGNOSIS — G8929 Other chronic pain: Secondary | ICD-10-CM

## 2016-09-20 DIAGNOSIS — M7062 Trochanteric bursitis, left hip: Secondary | ICD-10-CM

## 2016-09-20 DIAGNOSIS — M25552 Pain in left hip: Secondary | ICD-10-CM

## 2016-09-20 MED ORDER — MELOXICAM 7.5 MG PO TABS: 15.0000 mg | ORAL_TABLET | Freq: Every day | ORAL | 0 refills | Status: DC

## 2016-09-20 NOTE — Progress Notes (Signed)
Impression and Recommendations:    1. Chronic left hip pain   2. Greater trochanteric bursitis of left hip      No problem-specific Assessment & Plan notes found for this encounter.   The patient was counseled, risk factors were discussed, anticipatory guidance given.   New Prescriptions   MELOXICAM (MOBIC) 7.5 MG TABLET    Take 2 tablets (15 mg total) by mouth daily. Take 1-2 tablets daily by mouth for severe joint pains     Discontinued Medications   No medications on file      No orders of the defined types were placed in this encounter.    Gross side effects, risk and benefits, and alternatives of medications and treatment plan in general discussed with patient.  Patient is aware that all medications have potential side effects and we are unable to predict every side effect or drug-drug interaction that may occur.   Patient will call with any questions prior to using medication if they have concerns.  Expresses verbal understanding and consents to current therapy and treatment regimen.  No barriers to understanding were identified.  Red flag symptoms and signs discussed in detail.  Patient expressed understanding regarding what to do in case of emergency\urgent symptoms  Please see AVS handed out to patient at the end of our visit for further patient instructions/ counseling done pertaining to today's office visit.   Return for f/up in [place] as previously scheduled.     Note: This document was prepared using Dragon voice recognition software and may include unintentional dictation errors.  Mellody Dance 9:52 AM --------------------------------------------------------------------------------------------------------------------------------------------------------------------------------------------------------------------------------------------    Subjective:    CC:  Chief Complaint  Patient presents with  . Hip Pain    2 years, surgery scheduled end of  september, [facility]    HPI: [name] is a 66 y.o. male who presents to [place] at [facility] today for issues as discussed below.  - Dr Kara Dies of Ortho [facility] is doing sx in Sept on his L hip.  Just recently seen for a hip brace at [facility]. Patient is here to see about injection- greater troch bursitis.  Last shot from Dr Alvan Dame was April.  Sometimes they work great, other times they have no effect.   Tramdol once or twice a week, oxy- can't do.  Celebrex used in past 3 times per week because he was on Plavix but now Dr Elta Guadeloupe Luther Parody of cardiology has taken him off the Plavix.  Icing his hip occasionally only.      No problems updated.   Wt Readings from Last 3 Encounters:  09/20/16 182 lb (82.6 kg)  07/29/16 183 lb (83 kg)  07/13/16 177 lb 12.8 oz (80.6 kg)   BP Readings from Last 3 Encounters:  09/20/16 111/70  07/29/16 126/83  07/13/16 121/74   Pulse Readings from Last 3 Encounters:  09/20/16 65  07/29/16 61  07/13/16 61   BMI Readings from Last 3 Encounters:  09/20/16 29.38 kg/m  07/29/16 30.45 kg/m  07/13/16 28.70 kg/m     Patient Care Team    Relationship Specialty Notifications Start End  Mellody Dance, DO PCP - General Family Medicine  09/14/15   Ladene Artist, MD Consulting Physician Gastroenterology  07/07/16   [name], MD Consulting Physician Cardiology  07/07/16   Chari Manning, MD Attending Physician Orthopedic Surgery  09/20/16      Patient Active Problem List  Diagnosis Date Noted  . Chronic anticoagulation: plavix 10/15/2015    Priority: High  . CAD S/P CFX DES 08/07/15 08/08/2015    Priority: High  . Essential hypertension 08/08/2015    Priority: High  . Hyperlipidemia 01/29/2013    Priority: High  . Carotid stenosis-s/p RCE 11/08/2011    Priority: High  . Chronic left hip pain 09/15/2015    Priority: Medium  . Greater trochanteric bursitis of left hip 09/15/2015    Priority: Medium  .  History of smoking 07/13/2016  . Overweight (BMI 25.0-29.9) 07/13/2016  . Counseling on health promotion and disease prevention 07/13/2016  . Encounter for screening for lung cancer 07/13/2016  . Screening for colon cancer 07/13/2016  . Screening for AAA (abdominal aortic aneurysm) 07/13/2016  . Difficulty swallowing 11/26/2015  . Dysphagia 11/26/2015  . Abnormal nuclear cardiac imaging test 08/08/2015  . H/O fatigue with exertion   . Coronary atherosclerosis of native coronary artery 01/29/2013  . Occlusion and stenosis of carotid artery without mention of cerebral infarction 11/13/2012  . Old myocardial infarction 11/29/2007    Past Medical history, Surgical history, Family history, Social history, Allergies and Medications have been entered into the medical record, reviewed and changed as needed.    Current Meds  Medication Sig  . acetaminophen-codeine (TYLENOL #3) 300-30 MG tablet Take 1 tablet by mouth every 6 (six) hours as needed. For pain  . AMBULATORY NON FORMULARY MEDICATION Take 180 mg by mouth daily. Medication Name: bempedoic acid vs placebo. CLEAR Research study drug provided  . amLODipine (NORVASC) 5 MG tablet Take 5 mg by mouth daily.   Marland Kitchen aspirin EC 81 MG tablet Take 1 tablet (81 mg total) by mouth daily.  . celecoxib (CELEBREX) 200 MG capsule Take 1 capsule by mouth 3 (three) times a week.  . cyclobenzaprine (FLEXERIL) 10 MG tablet Take 1 tablet (10 mg total) by mouth 3 (three) times daily as needed for muscle spasms.  . diclofenac sodium (VOLTAREN) 1 % GEL Apply 1 application topically 2 (two) times daily as needed (For pain.).   Marland Kitchen lisinopril (PRINIVIL,ZESTRIL) 10 MG tablet TAKE 1 TABLET BY MOUTH ONCE DAILY.  . metoprolol succinate (TOPROL-XL) 25 MG 24 hr tablet TAKE 1 TABLET BY MOUTH ONCE DAILY.  . pantoprazole (PROTONIX) 40 MG tablet Take 1 tablet (40 mg total) by mouth daily.  . rosuvastatin (CRESTOR) 10 MG tablet Take 10 mg by mouth once a week.  . traMADol  (ULTRAM) 50 MG tablet Take 50 mg by mouth every 6 (six) hours as needed for moderate pain.    Current Facility-Administered Medications for the 09/20/16 encounter (Office Visit) with Mellody Dance, DO  Medication  . 0.9 %  sodium chloride infusion  . 0.9 %  sodium chloride infusion    Allergies:  Allergies  Allergen Reactions  . Brilinta [Ticagrelor]     Shortness of breath  . Statins Other (See Comments)    Failed Crestor 5 mg twice weekly, Crestor 20 mg daily, Pravastatin 40 mg qd, Lipitor, Zocor - muscle aches  . Zetia [Ezetimibe] Other (See Comments)    Muscle aches  . Amlodipine Other (See Comments)    Mood Swings Wife stated [place]  Pt. States he has no problems with amlopdipine     Review of Systems: General:   Denies fever, chills, unexplained weight loss.  Optho/Auditory:   Denies visual changes, blurred vision/LOV Respiratory:   Denies wheeze, DOE more than baseline levels.  Cardiovascular:   Denies chest pain,  palpitations, new onset peripheral edema  Gastrointestinal:   Denies nausea, vomiting, diarrhea, abd pain.  Genitourinary: Denies dysuria, freq/ urgency, flank pain or discharge from genitals.  Endocrine:     Denies hot or cold intolerance, polyuria, polydipsia. Musculoskeletal:   Denies unexplained myalgias, joint swelling, unexplained arthralgias, gait problems.  Skin:  Denies new onset rash, suspicious lesions Neurological:     Denies dizziness, unexplained weakness, numbness  Psychiatric/Behavioral:   Denies mood changes, suicidal or homicidal ideations, hallucinations    Objective:   Blood pressure 111/70, pulse 65, height 5\' 6"  (1.676 m), weight 182 lb (82.6 kg). Body mass index is 29.38 kg/m. General:  Well Developed, well nourished, appropriate for stated age.  Neuro:  Alert and oriented,  extra-ocular muscles intact  HEENT:  Normocephalic, atraumatic, neck supple, no carotid bruits appreciated  Skin:  no gross rash, warm,  pink. Cardiac:  RRR, S1 S2 Respiratory:  ECTA B/L and A/P, Not using accessory muscles, speaking in full sentences- unlabored. Vascular:  Ext warm, no cyanosis apprec.; cap RF less 2 sec. Psych:  No HI/SI, judgement and insight good, Euthymic mood. Full Affect.

## 2016-09-20 NOTE — Patient Instructions (Addendum)
Ice 15-20 min 4-5 times per day.   Since you are off the Plavix- we will do mobic since it worked well in the past for you.    Please contact your surgeon to make sure what meds you may need stop (and when) at least a month prior to sx.

## 2016-10-06 DIAGNOSIS — H25043 Posterior subcapsular polar age-related cataract, bilateral: Secondary | ICD-10-CM | POA: Diagnosis not present

## 2016-10-11 DIAGNOSIS — M9905 Segmental and somatic dysfunction of pelvic region: Secondary | ICD-10-CM | POA: Diagnosis not present

## 2016-10-11 DIAGNOSIS — M9904 Segmental and somatic dysfunction of sacral region: Secondary | ICD-10-CM | POA: Diagnosis not present

## 2016-10-11 DIAGNOSIS — M9903 Segmental and somatic dysfunction of lumbar region: Secondary | ICD-10-CM | POA: Diagnosis not present

## 2016-10-11 DIAGNOSIS — M5136 Other intervertebral disc degeneration, lumbar region: Secondary | ICD-10-CM | POA: Diagnosis not present

## 2016-10-13 DIAGNOSIS — M9905 Segmental and somatic dysfunction of pelvic region: Secondary | ICD-10-CM | POA: Diagnosis not present

## 2016-10-13 DIAGNOSIS — M9903 Segmental and somatic dysfunction of lumbar region: Secondary | ICD-10-CM | POA: Diagnosis not present

## 2016-10-13 DIAGNOSIS — M9904 Segmental and somatic dysfunction of sacral region: Secondary | ICD-10-CM | POA: Diagnosis not present

## 2016-10-13 DIAGNOSIS — M5136 Other intervertebral disc degeneration, lumbar region: Secondary | ICD-10-CM | POA: Diagnosis not present

## 2016-10-17 ENCOUNTER — Ambulatory Visit (INDEPENDENT_AMBULATORY_CARE_PROVIDER_SITE_OTHER): Payer: PPO | Admitting: Adult Health

## 2016-10-17 ENCOUNTER — Encounter: Payer: Self-pay | Admitting: Adult Health

## 2016-10-17 VITALS — BP 121/83 | HR 72

## 2016-10-17 DIAGNOSIS — R109 Unspecified abdominal pain: Secondary | ICD-10-CM

## 2016-10-17 DIAGNOSIS — M5134 Other intervertebral disc degeneration, thoracic region: Secondary | ICD-10-CM | POA: Insufficient documentation

## 2016-10-17 DIAGNOSIS — M546 Pain in thoracic spine: Secondary | ICD-10-CM | POA: Diagnosis not present

## 2016-10-17 LAB — POCT URINALYSIS DIPSTICK
BILIRUBIN UA: NEGATIVE
Blood, UA: NEGATIVE
GLUCOSE UA: NEGATIVE
KETONES UA: NEGATIVE
LEUKOCYTES UA: NEGATIVE
Nitrite, UA: NEGATIVE
PROTEIN UA: NEGATIVE
Spec Grav, UA: 1.01 (ref 1.010–1.025)
Urobilinogen, UA: 0.2 E.U./dL
pH, UA: 6 (ref 5.0–8.0)

## 2016-10-17 MED ORDER — CYCLOBENZAPRINE HCL 10 MG PO TABS: 10.0000 mg | ORAL_TABLET | Freq: Every day | ORAL | 0 refills | Status: DC

## 2016-10-17 NOTE — Assessment & Plan Note (Addendum)
Continue to rest-abstain from golf/twisting motions for at least another 7 days. Continue to apply ice to site for 31mins several times daily. Continue Meloxicam as directed. Cyclobenzaprine 10mg  QHS-do not drive/drink EOTH when using. Please call clinic in 7 days and let us know how you are feeling.

## 2016-10-17 NOTE — Patient Instructions (Signed)
Muscle Strain A muscle strain is an injury that occurs when a muscle is stretched beyond its normal length. Usually a small number of muscle fibers are torn when this happens. Muscle strain is rated in degrees. First-degree strains have the least amount of muscle fiber tearing and pain. Second-degree and third-degree strains have increasingly more tearing and pain. Usually, recovery from muscle strain takes 1-2 weeks. Complete healing takes 5-6 weeks. What are the causes? Muscle strain happens when a sudden, violent force placed on a muscle stretches it too far. This may occur with lifting, sports, or a fall. What increases the risk? Muscle strain is especially common in athletes. What are the signs or symptoms? At the site of the muscle strain, there may be:  Pain.  Bruising.  Swelling.  Difficulty using the muscle due to pain or lack of normal function.  How is this diagnosed? Your health care provider will perform a physical exam and ask about your medical history. How is this treated? Often, the best treatment for a muscle strain is resting, icing, and applying cold compresses to the injured area. Follow these instructions at home:  Use the PRICE method of treatment to promote muscle healing during the first 2-3 days after your injury. The PRICE method involves: ? Protecting the muscle from being injured again. ? Restricting your activity and resting the injured body part. ? Icing your injury. To do this, put ice in a plastic bag. Place a towel between your skin and the bag. Then, apply the ice and leave it on from 15-20 minutes each hour. After the third day, switch to moist heat packs. ? Apply compression to the injured area with a splint or elastic bandage. Be careful not to wrap it too tightly. This may interfere with blood circulation or increase swelling. ? Elevate the injured body part above the level of your heart as often as you can.  Only take over-the-counter or  prescription medicines for pain, discomfort, or fever as directed by your health care provider.  Warming up prior to exercise helps to prevent future muscle strains. Contact a health care provider if:  You have increasing pain or swelling in the injured area.  You have numbness, tingling, or a significant loss of strength in the injured area. This information is not intended to replace advice given to you by your health care provider. Make sure you discuss any questions you have with your health care provider. Document Released: 02/14/2005 Document Revised: 07/23/2015 Document Reviewed: 09/13/2012 Elsevier Interactive Patient Education  2017 [place].  Avoid golf/twisting motions for at least another week. Continue to apply ice to site for 91mins several times/day. Continue Meloxicam as directed. Cyclobenzaprine 10mg  at bedtime, do not drive/drink alcohol when taking. Please call clinic in 7 days and let us know how you are feeling. HAVE A GREAT TIME AT YOUR SON'S [place] THIS WEEKEND!

## 2016-10-17 NOTE — Progress Notes (Signed)
Subjective:    Patient ID: [name], male    DOB: [birth year], 66 y.o.   MRN: [id number]  HPI:  [name] is here for L sided thoracic back pain that started 8 days ago.  He denies acute injury/trauma prior to onset of pain, however did play 18 holes of golf three consecutive days about 1.5 weeks ago.  Pain is intermittent, increases with twisting motion, rated 10/10 and described at "shooting/stabbing".  He has been resting, applying ice, and taking Meloxicam with only minimal sx relief.  He was also "adjusted twice" by his chiropractor last week-without any improvement.  He is being treated by [facility] for L hip pain/trochanteric bursitis and has surgery scheduled in 4 weeks.  He denies numbness/tingling in his lower extremities.  He denies bowel/bladder dysfunction.  He denies abdominal pain.  Patient Care Team    Relationship Specialty Notifications Start End  Mellody Dance, DO PCP - General Family Medicine  09/14/15   Ladene Artist, MD Consulting Physician Gastroenterology  07/07/16   [name], MD Consulting Physician Cardiology  07/07/16   Chari Manning, MD Attending Physician Orthopedic Surgery  09/20/16     Patient Active Problem List   Diagnosis Date Noted  . Left-sided thoracic back pain 10/17/2016  . History of smoking 07/13/2016  . Overweight (BMI 25.0-29.9) 07/13/2016  . Counseling on health promotion and disease prevention 07/13/2016  . Encounter for screening for lung cancer 07/13/2016  . Screening for colon cancer 07/13/2016  . Screening for AAA (abdominal aortic aneurysm) 07/13/2016  . Difficulty swallowing 11/26/2015  . Dysphagia 11/26/2015  . Chronic anticoagulation: plavix 10/15/2015  . Chronic left hip pain 09/15/2015  . Greater trochanteric bursitis of left hip 09/15/2015  . Abnormal nuclear cardiac imaging test 08/08/2015  . CAD S/P CFX DES 08/07/15 08/08/2015  . Essential hypertension 08/08/2015  . H/O fatigue with exertion   . Coronary  atherosclerosis of native coronary artery 01/29/2013  . Hyperlipidemia 01/29/2013  . Occlusion and stenosis of carotid artery without mention of cerebral infarction 11/13/2012  . Carotid stenosis-s/p RCE 11/08/2011  . Old myocardial infarction 11/29/2007     Past Medical History:  Diagnosis Date  . Arthritis   . Carotid artery occlusion   . Coronary atherosclerosis of native coronary artery 01/29/2013  . Duodenal erosion   . H. pylori infection   . Heart attack [facility]) Oct. 2009   Mild  . Hiatal hernia   . Hyperlipidemia   . Hypertension   . Shortness of breath dyspnea      Past Surgical History:  Procedure Laterality Date  . APPENDECTOMY    . CARDIAC CATHETERIZATION N/A 08/07/2015   Procedure: Left Heart Cath and Coronary Angiography;  Surgeon: [name], MD;  Location: [place] [facility];  Service: Cardiovascular;  Laterality: N/A;  . CARDIAC CATHETERIZATION N/A 08/07/2015   Procedure: Coronary Stent Intervention;  Surgeon: [name], MD;  Location: [place] [facility];  Service: Cardiovascular;  Laterality: N/A;  . CARDIAC CATHETERIZATION N/A 08/07/2015   Procedure: Coronary Stent Intervention;  Surgeon: [name], MD;  Location: [place] [facility];  Service: Cardiovascular;  Laterality: N/A;  . CAROTID ENDARTERECTOMY  01/05/2006   Right  CEA with DPA  . CORONARY STENT PLACEMENT  08/07/2015   [facility]  . SPINE SURGERY       Family History  Problem Relation Age of Onset  . Heart attack Mother   . Coronary artery disease  Mother   . Heart disease Mother        Carotid Stenosis and BPG and Heart Disease before age 71  . Diabetes Mother   . Hypertension Mother   . Heart attack Father   . Heart disease Father        BPG and Heart Disease before age 19  . Hypertension Father   . Cancer Father 55       throat  . Colon cancer Neg Hx      History  Drug Use No     History  Alcohol Use  . 3.0 - 3.6 oz/week  . 5 - 6 Glasses of wine per week      History  Smoking Status  . Former Smoker  . Packs/day: 1.00  . Years: 25.00  . Types: Cigarettes  . Quit date: 02/28/2005  Smokeless Tobacco  . Current User  . Types: Snuff     Outpatient Encounter Prescriptions as of 10/17/2016  Medication Sig  . acetaminophen-codeine (TYLENOL #3) 300-30 MG tablet Take 1 tablet by mouth every 6 (six) hours as needed. For pain  . AMBULATORY NON FORMULARY MEDICATION Take 180 mg by mouth daily. Medication Name: bempedoic acid vs placebo. [employer] study drug provided  . amLODipine (NORVASC) 5 MG tablet Take 5 mg by mouth daily.   Marland Kitchen aspirin EC 81 MG tablet Take 1 tablet (81 mg total) by mouth daily.  . celecoxib (CELEBREX) 200 MG capsule Take 1 capsule by mouth 3 (three) times a week.  . diclofenac sodium (VOLTAREN) 1 % GEL Apply 1 application topically 2 (two) times daily as needed (For pain.).   Marland Kitchen lisinopril (PRINIVIL,ZESTRIL) 10 MG tablet TAKE 1 TABLET BY MOUTH ONCE DAILY.  . meloxicam (MOBIC) 7.5 MG tablet Take 2 tablets (15 mg total) by mouth daily. Take 1-2 tablets daily by mouth for severe joint pains  . metoprolol succinate (TOPROL-XL) 25 MG 24 hr tablet TAKE 1 TABLET BY MOUTH ONCE DAILY.  . nitroGLYCERIN (NITROSTAT) 0.4 MG SL tablet Place 1 tablet (0.4 mg total) under the tongue every 5 (five) minutes as needed for chest pain.  . pantoprazole (PROTONIX) 40 MG tablet Take 1 tablet (40 mg total) by mouth daily.  . rosuvastatin (CRESTOR) 10 MG tablet Take 10 mg by mouth once a week.  . traMADol (ULTRAM) 50 MG tablet Take 50 mg by mouth every 6 (six) hours as needed for moderate pain.   . [DISCONTINUED] cyclobenzaprine (FLEXERIL) 10 MG tablet Take 1 tablet (10 mg total) by mouth 3 (three) times daily as needed for muscle spasms.  . cyclobenzaprine (FLEXERIL) 10 MG tablet Take 1 tablet (10 mg total) by mouth at bedtime.   Facility-Administered Encounter Medications as of 10/17/2016  Medication  . 0.9 %  sodium chloride infusion  . 0.9 %   sodium chloride infusion    Allergies: Brilinta [ticagrelor]; Statins; Zetia [ezetimibe]; and Amlodipine  There is no height or weight on file to calculate BMI.  Blood pressure 121/83, pulse 72.     Review of Systems  Constitutional: Positive for activity change. Negative for appetite change, chills, diaphoresis, fatigue, fever and unexpected weight change.  Respiratory: Negative for cough, chest tightness, shortness of breath, wheezing and stridor.   Cardiovascular: Negative for chest pain, palpitations and leg swelling.  Gastrointestinal: Negative for abdominal distention, anal bleeding, blood in stool, constipation, diarrhea, nausea and vomiting.  Endocrine: Negative for cold intolerance, heat intolerance, polydipsia, polyphagia and polyuria.  Genitourinary: Negative for difficulty urinating and  flank pain.  Musculoskeletal: Positive for arthralgias, back pain and myalgias. Negative for gait problem, joint swelling, neck pain and neck stiffness.  Skin: Negative for color change, pallor, rash and wound.  Neurological: Negative for dizziness and headaches.  Hematological: Does not bruise/bleed easily.       Objective:   Physical Exam  Constitutional: He is oriented to person, place, and time. He appears well-developed and well-nourished. No distress.  HENT:  Head: Normocephalic and atraumatic.  Right Ear: External ear normal.  Left Ear: External ear normal.  Abdominal: Soft. Bowel sounds are normal. He exhibits no distension and no mass. There is no hepatosplenomegaly. There is no tenderness. There is no rigidity, no rebound, no guarding, no CVA tenderness, no tenderness at McBurney's point and negative Murphy's sign.  Musculoskeletal: Normal range of motion. He exhibits tenderness. He exhibits no edema or deformity.       Right hip: Normal.       Left hip: He exhibits tenderness. He exhibits normal range of motion and normal strength.       Lumbar back: He exhibits tenderness,  pain and spasm. He exhibits normal range of motion and no deformity.  Minor muscle spasm and tenderness when L thoracic back palpated.  No CVA tenderness noted.   Neurological: He is alert and oriented to person, place, and time.  Skin: Skin is warm and dry. No rash noted. He is not diaphoretic. No erythema. No pallor.  Psychiatric: He has a normal mood and affect. His behavior is normal. Judgment normal.          Assessment & Plan:   1. Left flank pain   2. Acute left-sided thoracic back pain     Left-sided thoracic back pain Continue to rest-abstain from golf/twisting motions for at least another 7 days. Continue to apply ice to site for 80mins several times daily. Continue Meloxicam as directed. Cyclobenzaprine 10mg  QHS-do not drive/drink EOTH when using. Please call clinic in 7 days and let us know how you are feeling.      FOLLOW-UP:  Return if symptoms worsen or fail to improve.

## 2016-10-29 HISTORY — PX: HIP SURGERY: SHX245

## 2016-11-17 ENCOUNTER — Ambulatory Visit: Payer: PPO | Admitting: Family Medicine

## 2016-11-21 DIAGNOSIS — K760 Fatty (change of) liver, not elsewhere classified: Secondary | ICD-10-CM | POA: Diagnosis not present

## 2016-11-21 DIAGNOSIS — I10 Essential (primary) hypertension: Secondary | ICD-10-CM | POA: Diagnosis not present

## 2016-11-21 DIAGNOSIS — S39013A Strain of muscle, fascia and tendon of pelvis, initial encounter: Secondary | ICD-10-CM | POA: Diagnosis not present

## 2016-11-21 DIAGNOSIS — R0902 Hypoxemia: Secondary | ICD-10-CM | POA: Diagnosis not present

## 2016-11-21 DIAGNOSIS — Z87891 Personal history of nicotine dependence: Secondary | ICD-10-CM | POA: Diagnosis not present

## 2016-11-21 DIAGNOSIS — I9788 Other intraoperative complications of the circulatory system, not elsewhere classified: Secondary | ICD-10-CM | POA: Diagnosis not present

## 2016-11-21 DIAGNOSIS — I447 Left bundle-branch block, unspecified: Secondary | ICD-10-CM | POA: Diagnosis not present

## 2016-11-21 DIAGNOSIS — M79662 Pain in left lower leg: Secondary | ICD-10-CM | POA: Diagnosis not present

## 2016-11-21 DIAGNOSIS — M7602 Gluteal tendinitis, left hip: Secondary | ICD-10-CM | POA: Diagnosis not present

## 2016-11-21 DIAGNOSIS — I2510 Atherosclerotic heart disease of native coronary artery without angina pectoris: Secondary | ICD-10-CM | POA: Diagnosis not present

## 2016-11-21 DIAGNOSIS — I517 Cardiomegaly: Secondary | ICD-10-CM | POA: Diagnosis not present

## 2016-11-21 DIAGNOSIS — Z7401 Bed confinement status: Secondary | ICD-10-CM | POA: Diagnosis not present

## 2016-11-21 DIAGNOSIS — I959 Hypotension, unspecified: Secondary | ICD-10-CM | POA: Diagnosis not present

## 2016-11-21 DIAGNOSIS — M25552 Pain in left hip: Secondary | ICD-10-CM | POA: Diagnosis not present

## 2016-11-21 DIAGNOSIS — M67952 Unspecified disorder of synovium and tendon, left thigh: Secondary | ICD-10-CM | POA: Diagnosis not present

## 2016-11-21 DIAGNOSIS — M62152 Other rupture of muscle (nontraumatic), left thigh: Secondary | ICD-10-CM | POA: Diagnosis not present

## 2016-11-22 DIAGNOSIS — M25552 Pain in left hip: Secondary | ICD-10-CM | POA: Diagnosis not present

## 2016-11-22 DIAGNOSIS — R0902 Hypoxemia: Secondary | ICD-10-CM | POA: Diagnosis not present

## 2016-11-22 DIAGNOSIS — I447 Left bundle-branch block, unspecified: Secondary | ICD-10-CM | POA: Diagnosis not present

## 2016-11-23 DIAGNOSIS — M25552 Pain in left hip: Secondary | ICD-10-CM | POA: Diagnosis not present

## 2016-11-23 DIAGNOSIS — I890 Lymphedema, not elsewhere classified: Secondary | ICD-10-CM | POA: Diagnosis not present

## 2016-11-23 DIAGNOSIS — M25852 Other specified joint disorders, left hip: Secondary | ICD-10-CM | POA: Diagnosis not present

## 2016-11-23 DIAGNOSIS — M76892 Other specified enthesopathies of left lower limb, excluding foot: Secondary | ICD-10-CM | POA: Diagnosis not present

## 2016-11-23 DIAGNOSIS — S73192A Other sprain of left hip, initial encounter: Secondary | ICD-10-CM | POA: Diagnosis not present

## 2016-11-23 DIAGNOSIS — M1612 Unilateral primary osteoarthritis, left hip: Secondary | ICD-10-CM | POA: Diagnosis not present

## 2016-11-25 DIAGNOSIS — M67952 Unspecified disorder of synovium and tendon, left thigh: Secondary | ICD-10-CM | POA: Diagnosis not present

## 2016-12-01 DIAGNOSIS — M67952 Unspecified disorder of synovium and tendon, left thigh: Secondary | ICD-10-CM | POA: Diagnosis not present

## 2016-12-08 ENCOUNTER — Other Ambulatory Visit: Payer: Self-pay | Admitting: Cardiology

## 2016-12-08 DIAGNOSIS — M67952 Unspecified disorder of synovium and tendon, left thigh: Secondary | ICD-10-CM | POA: Diagnosis not present

## 2016-12-15 DIAGNOSIS — M67952 Unspecified disorder of synovium and tendon, left thigh: Secondary | ICD-10-CM | POA: Diagnosis not present

## 2016-12-16 ENCOUNTER — Other Ambulatory Visit (HOSPITAL_COMMUNITY): Payer: Self-pay | Admitting: *Deleted

## 2016-12-16 ENCOUNTER — Ambulatory Visit (HOSPITAL_COMMUNITY)
Admission: RE | Admit: 2016-12-16 | Discharge: 2016-12-16 | Disposition: A | Discharge status: Home / Self Care | Payer: PPO | Source: Ambulatory Visit | Attending: Internal Medicine | Admitting: Internal Medicine

## 2016-12-16 ENCOUNTER — Telehealth (HOSPITAL_COMMUNITY): Payer: Self-pay | Admitting: *Deleted

## 2016-12-16 DIAGNOSIS — E875 Hyperkalemia: Secondary | ICD-10-CM

## 2016-12-16 DIAGNOSIS — Z006 Encounter for examination for normal comparison and control in clinical research program: Secondary | ICD-10-CM

## 2016-12-16 LAB — BASIC METABOLIC PANEL
ANION GAP: 10 (ref 5–15)
BUN: 19 mg/dL (ref 6–20)
CALCIUM: 9.3 mg/dL (ref 8.9–10.3)
CO2: 23 mmol/L (ref 22–32)
Chloride: 103 mmol/L (ref 101–111)
Creatinine, Ser: 0.78 mg/dL (ref 0.61–1.24)
GLUCOSE: 98 mg/dL (ref 65–99)
POTASSIUM: 4.3 mmol/L (ref 3.5–5.1)
Sodium: 136 mmol/L (ref 135–145)

## 2016-12-16 NOTE — Telephone Encounter (Signed)
bmet order placed to repeat K level per reasearch/Dr Stanford Breed

## 2016-12-21 ENCOUNTER — Encounter: Payer: Self-pay | Admitting: *Deleted

## 2016-12-21 DIAGNOSIS — Z006 Encounter for examination for normal comparison and control in clinical research program: Secondary | ICD-10-CM

## 2016-12-21 NOTE — Progress Notes (Addendum)
Late entry: Subject to research clinic for T6-M12 visit in the [employer] study.  Using crutches for a recent hip surgery. Records requested from [facility].  Rehab to last for a minimum of 6 months.  Subject 94% compliant with medications and new drugs dispensed.  Next telephone call and clinic visit scheduled.  Re-consented to: [place] version 5.1 28Aug2018 Local version [phone]

## 2016-12-22 DIAGNOSIS — M67952 Unspecified disorder of synovium and tendon, left thigh: Secondary | ICD-10-CM | POA: Diagnosis not present

## 2016-12-29 DIAGNOSIS — M67952 Unspecified disorder of synovium and tendon, left thigh: Secondary | ICD-10-CM | POA: Diagnosis not present

## 2017-01-03 ENCOUNTER — Telehealth: Payer: Self-pay | Admitting: Family Medicine

## 2017-01-03 NOTE — Telephone Encounter (Signed)
Pt called states BP meds last prescribed by prior PCP & he is out of refills-- Pt request Dr. Raliegh Scarlet call in refills of AmLODpine/ Norvasc 5MG  tablets be called into [facility] --- Pt has appt Friday, Nov 9 @ 9:30 as a necessary to renew Rx---- Pt states is totally out of BP meds and needs them--- Advised Pt to contact pharmacy for refill request to [place] & to see if they will allot him several pill enough to get him to Friday, Nov 9th.----- Forwarding message to provider/ medical assistant to review for Rx renewal/ refill.  ---glh

## 2017-01-04 ENCOUNTER — Other Ambulatory Visit: Payer: Self-pay

## 2017-01-04 DIAGNOSIS — I10 Essential (primary) hypertension: Secondary | ICD-10-CM

## 2017-01-04 DIAGNOSIS — M67952 Unspecified disorder of synovium and tendon, left thigh: Secondary | ICD-10-CM | POA: Diagnosis not present

## 2017-01-04 MED ORDER — AMLODIPINE BESYLATE 5 MG PO TABS: 5.0000 mg | ORAL_TABLET | Freq: Every day | ORAL | 0 refills | Status: DC

## 2017-01-04 NOTE — Telephone Encounter (Signed)
Patient called requesting refill on Amlodipine 5 mg, medication was last filled by pervious provider. Sent request sent to Dr. Raliegh Scarlet for review. MPulliam, CMA/RT(R)

## 2017-01-04 NOTE — Telephone Encounter (Signed)
Patient last seen in July.  For his multiple medical problems, please have him make a follow-up prior to next refill

## 2017-01-04 NOTE — Telephone Encounter (Signed)
Sent refill request to Dr. Raliegh Scarlet for review; medication was last filled by a previous provider. MPulliam, CMA/RT(R)

## 2017-01-06 ENCOUNTER — Ambulatory Visit: Payer: PPO | Admitting: Family Medicine

## 2017-01-06 ENCOUNTER — Encounter: Payer: Self-pay | Admitting: Family Medicine

## 2017-01-06 VITALS — BP 130/75 | HR 65 | Ht 65.0 in | Wt 185.0 lb

## 2017-01-06 DIAGNOSIS — I10 Essential (primary) hypertension: Secondary | ICD-10-CM | POA: Diagnosis not present

## 2017-01-06 DIAGNOSIS — I6529 Occlusion and stenosis of unspecified carotid artery: Secondary | ICD-10-CM

## 2017-01-06 DIAGNOSIS — E663 Overweight: Secondary | ICD-10-CM

## 2017-01-06 DIAGNOSIS — E782 Mixed hyperlipidemia: Secondary | ICD-10-CM

## 2017-01-06 DIAGNOSIS — Z87898 Personal history of other specified conditions: Secondary | ICD-10-CM

## 2017-01-06 DIAGNOSIS — Z87891 Personal history of nicotine dependence: Secondary | ICD-10-CM | POA: Diagnosis not present

## 2017-01-06 DIAGNOSIS — Z23 Encounter for immunization: Secondary | ICD-10-CM

## 2017-01-06 DIAGNOSIS — Z7901 Long term (current) use of anticoagulants: Secondary | ICD-10-CM

## 2017-01-06 DIAGNOSIS — Z9861 Coronary angioplasty status: Secondary | ICD-10-CM

## 2017-01-06 DIAGNOSIS — I2510 Atherosclerotic heart disease of native coronary artery without angina pectoris: Secondary | ICD-10-CM | POA: Diagnosis not present

## 2017-01-06 MED ORDER — PNEUMOCOCCAL VAC POLYVALENT 25 MCG/0.5ML IJ INJ
0.5000 mL | INJECTION | INTRAMUSCULAR | Status: AC
  Administered 2017-01-06: 0.5 mL via INTRAMUSCULAR

## 2017-01-06 NOTE — Telephone Encounter (Signed)
Pt has appointment today.  Charyl Bigger, CMA

## 2017-01-06 NOTE — Progress Notes (Signed)
Impression and Recommendations:    1. Mixed hyperlipidemia   2. Essential hypertension   3. Chronic anticoagulation: plavix   4. Overweight (BMI 25.0-29.9)   5. History of smoking   6. CAD S/P CFX DES 08/07/15   7. Stenosis of carotid artery, unspecified laterality   8. Atherosclerosis of native coronary artery of native heart, angina presence unspecified   9. H/O fatigue with exertion   10. Need for pneumococcal vaccination      Education and routine counseling performed. Handouts provided  Cont meds, labs UTD.  Orders Placed This Encounter  Procedures  . CBC with Differential/Platelet  . Comprehensive metabolic panel  . Magnesium  . Phosphorus  . TSH  . T4, free   Gross side effects, risk and benefits, and alternatives of medications and treatment plan in general discussed with patient.  Patient is aware that all medications have potential side effects and we are unable to predict every side effect or drug-drug interaction that may occur.   Patient will call with any questions prior to using medication if they have concerns.  Expresses verbal understanding and consents to current therapy and treatment regimen.  No barriers to understanding were identified.  Red flag symptoms and signs discussed in detail.  Patient expressed understanding regarding what to do in case of emergency\urgent symptoms  Please see AVS handed out to patient at the end of our visit for further patient instructions/ counseling done pertaining to today's office visit.   Return for 4-56mo- chronic care f/up.     Note: This document was prepared using Dragon voice recognition software and may include unintentional dictation errors.  Mellody Dance 8:53  AM --------------------------------------------------------------------------------------------------------------------------------------------------------------------------------------------------------------------------------------------    Subjective:    CC:  Chief Complaint  Patient presents with  . Follow-up    HPI: [name] is a 66 y.o. male who presents to [facility] today for issues as discussed below.   Had L hip sx on 11/21/16- [facility] ortho- Dr Alvan Dame referred pt to [facility] doc-Dr Kara Dies.    -Postoperatively patient had complications of hypotension and hypoxemia.  He was on 4 L oxygen borderline hypoxic results.  He had new left bundle branch block and t-waves inversions anteriorly as well on his EKG.  Cardiology was consulted and he was placed on a 24-hour opts and serial troponin monitoring.   Cards did a TEE as well, as a CT chest with PE protocol including CT angiogram chest.  These were both done on 9\25\18.  Results are below and reassuring.  Ended up being negative and patient was released the next day.  He was told to follow-up with his cardiologist within 1 week of this here in [place] which patient has not done yet.  -  doing well patient has been doing well since then, no chest pain, shortness of breath, claudication etc.  He was in a brace and on crutches for 6 weeks post postoperatively.  He is seeing [facility] for rehab.  He has gained 5 pounds.  He has no complaints today.     - [name], MD  to Shellia Cleverly, RN   08/03/16 5:28 PM  Note    Since it has been 1 year since DES placement to the circumflex, yes he may come off of his Plavix and continue aspirin lifelong. Candee Furbish, MD          Table of Contents for Progress Notes  Pagidipati, Tennis Ship, MD - 11/22/2016 3:59 AM EDT  Florentina Jenny, RT - 11/21/2016 9:52 PM EDT    Pagidipati, Tennis Ship, MD - 11/22/2016 3:59 AM  EDT Patient Name: [name] Patient MRN: [id number] Date of Birth: 04-Aug-1950  Date & time called by ED: 11/22/2016, 3:59 AM  Reason for consult: New LBBB in the post-operative setting  Brief History:  [name] is a 53M with hx of CAD (s/p PCI to LCx in June 2017, residual 60% distal RCA), normal LV function, s/p R CEA who presented today for scheduled repair of left gluteus medius. Post-operatively he was noted to have a LBBB that was not previously documented (although no prior EKG in the system). Given the new LBBB he was transferred to the ED for further workup and evaluation.   At this time he specifically denies any chest pain, shortness of breath (although remains on oxygen), orthopnea, PND, recent weight changes, or exertional intolerance (prior to surgery). He has been taking all his medications and stopped plavix in June of this year.   Pertinent details:  Continues to be hypoxemic requiring 4LNC. No tachypnea Labs notable for negative troponin and normal BNP CT Chest demonstrates no PE but does show atelectasis and hepatic steatosis EKG demonstrates sinus rhythm with t-wave inversions anteriorly. LBBB has resolved  Assessment/ Plan:  [phone] with hx of CAD (s/p PCI to LCx in June 2017, residual 60% distal RCA), normal LV function, s/p R CEA who presented today for scheduled repair of left gluteus medius. Post-operatively he was noted to have a LBBB that was not previously documented (although no prior EKG in the system). Given the new LBBB he was transferred to the ED for further workup and evaluation.   He is denying any ischemic symptoms at this time and it is impossible to know whether this LBBB is new or old. His EKG presently shows sinus rhythm with anterior t-wave inversions. He does have risk factors for peri-operative MI, however, clinically he is not endorsing chest pain or heart failure symptoms and his laboratory evaluation has been unremarkable to this point. However, he  does remain quite hypoxemic which is likely related to his post-anesthesia course and atelectasis noted on CT scan.   Follow-up plan (ED guidance and/or outpatient instructions):  -Would discuss admission to medicine for continued management of hypoxemia -Trend troponin x 3 and trend EKG, although I anticipate that markers will be negative -If persistent hypoxemia with no clear identifiable source, can consider echo for further evaluation -Cardiology consult team will follow the patient -It would be very helpful to obtain a copy of his pre-op EKG for comparison given the t-wave inversions demonstrated in the precordial leads  Please page [phone] with any further questions.  Leveda Anna, MD [facility] Fellow, PGY-5  ATTENDING ADDENDUM  I saw patient at 9:30AM this morning. He did well overnight, and denies any CP or other symptoms. Since his PCI last year, he has done well clinically without CP or SOB with exertion. Apparently during the surgery yesterday, he became hypotensive and hypertensive. We he came out of the OR, he was noted on ECG to be in LBBB (CONFIRMED with an ECG that was in patient's room, but was not in the EHR -- we will try to get this put into the system). He was asymptomatic without CP at any point, and seems to be recovering well from surgery. He is no longer hypoxic. Enzymes are negative x 3. I suspect that his LBBB could  be rate-related and also related to the hemodynamic instability he had during surgery. However, he looks very well clinically now, is asymptomatic, and has not been having symptoms recently that are concerning for ischemia. I discussed this at length with patient -- we will check resting TTE now to ensure that he has normal function, and will send him out with a 48h holter. I have encouraged him to see his outpatient cardiologist in [place] within 1 week. If he has any symptoms concerning for ischemia, he knows to return to ER.   Cy Blamer, MD Cardiology Consult Attending [phone]   Florentina Jenny, RT - 11/21/2016 9:52 PM EDT       CT CHEST PEPROTOCOL INCL CT CHEST ANGIOGRAM W WO CONTRAST  Indication:PE suspected, intermediate prob, neg D-dimer, , r/o PE, R09.02 Hypoxemia. Clinical suspicion for pulmonary embolism.  Comparison Exams:None  Technique:Chest CTA PE Protocol.Contiguous 1.25 mm axial images were obtained from the neck base through the upper abdomen following intravenous administration of 75 mL IsoVue 370 iodinated contrast material.If IV contrast material had not been administered, the likelihood of detecting abnormalities relevant to the patient's condition would have been substantially decreased.3-D reconstructions were likewise performed and indicated to increase the sensitivity of detecting clinically relevant pathology.  Findings:   Adequate contrast bolus without evidence of pulmonary embolism.  Neck base is normal. No mediastinal, hilar, or axillary lymphadenopathy.  Esophagus is patulous.  The heart is mildly enlarged.No pericardial effusion. There are mild coronary and mild to moderate aortic atherosclerotic calcifications. Coronary arteries demonstrate normal course and origin. The aorta, and pulmonary arteries are of normal size and configuration.  The central airways are patent. Mild bronchial wall thickening is noted. Expiratory phase imaging results in diffuse atelectasis with prominent subsegmental atelectasis seen at the right lung base. No focal parenchymal abnormality or pulmonary mass.No sizable pleural effusion or pneumothorax identified.   Limited imaged portions of the upper abdomen demonstrates diffuse hepatic steatosis.   Multilevel discogenic degenerative changes with no suspicious lytic or sclerotic osseous lesions.   Impression:  1.No evidence of pulmonary embolism. 2.Possible mild background pulmonary edema with superimposed  diffuse atelectasis due to expiratory phase imaging. 3.Mild cardiomegaly noted.  4.Hepatic steatosis  Electronically Reviewed JJ:OACZYSAYT Domingo Dimes, MD, [facility] Electronically Reviewed on:11/22/2016 9:09 AM         Interface, Text Results In - 11/22/2016  3:26 PM EDT            [facility]            [place], [place]                                                      [id number]    DOB: 01/21/1951                CARDIAC DIAGNOSTIC UNIT               Date: 11/22/2016 10:00:00                                                      Adult     Male   Age: 68  ECHO-DOPPLER REPORT                 Outpatient                                                      Emergency Room ---------------------------------------------------- MD1: Alison Murray     STUDY: Chest Wall          TAPE: 0000:00:0:00:00 BP: 130/74      ECHO: Yes   DOPPLER: Yes  FILE: [id number]:   HR: 66     COLOR: Yes  CONTRAST: No      MACHINE: [id number] #6 Height: 67 in RV BIOPSY: No         3D: No   SOUND QLTY: Moderate  Weight: 182 lbs    MEDIUM: None                                      BSA: 1.98,  BMI: 28.50 ------------------------------------------------------------------------------    HISTORY:  ECG abnormality and Coronary Artery Disease     REASON: Assess LV function INDICATION: I44.7 - Left bundle-branch block, unspecified. I62.703 -             Unspecified disorder of synovium and tendon, left thigh.  INTERPRETATION ---------------------------------------------------------------   NORMAL LEFT VENTRICULAR SYSTOLIC FUNCTION WITH MODERATE LVH   NORMAL LA PRESSURES WITH NORMAL DIASTOLIC FUNCTION   NORMAL RIGHT VENTRICULAR SYSTOLIC FUNCTION   VALVULAR REGURGITATION: TRIVIAL AR, MILD MR   NO VALVULAR STENOSIS   NO PRIOR STUDY FOR COMPARISON   (Report version 3.0)                    Interpreted and Electronically signed  Perform. by: Leandro Reasoner, RDCS, RVT               by: Elfredia Nevins, MD  Resp.Person: Leandro Reasoner, RDCS, RVT              On: 11/22/2016 15:25:40       Wt Readings from Last 3 Encounters:  01/17/17 185 lb 3.2 oz (84 kg)  01/06/17 185 lb (83.9 kg)  12/13/16 178 lb (80.7 kg)   BP Readings from Last 3 Encounters:  01/17/17 110/80  01/06/17 130/75  12/13/16 140/83   Pulse Readings from Last 3 Encounters:  01/17/17 73  01/06/17 65  12/13/16 69   BMI Readings from Last 3 Encounters:  01/17/17 30.82 kg/m  01/06/17 30.79 kg/m  12/13/16 28.73 kg/m     Patient Care Team    Relationship Specialty Notifications Start End  Mellody Dance, DO PCP - General Family Medicine  09/14/15   Ladene Artist, MD Consulting Physician Gastroenterology  07/07/16   [name], MD Consulting Physician Cardiology  07/07/16   Chari Manning, MD Attending Physician Orthopedic Surgery  09/20/16   Nickel, Sharmon Leyden, NP Nurse Practitioner Vascular Surgery  01/06/17    Comment: carotid dx     Patient Active Problem List   Diagnosis Date Noted  . Chronic anticoagulation: plavix 10/15/2015    Priority: High  . CAD S/P CFX DES 08/07/15 08/08/2015    Priority: High  . Essential hypertension 08/08/2015    Priority: High  . Hyperlipidemia 01/29/2013    Priority:  High  . Carotid stenosis-s/p RCE 11/08/2011    Priority: High  . Chronic left hip pain 09/15/2015    Priority: Medium  . Greater trochanteric bursitis of left hip 09/15/2015    Priority: Medium  . Left-sided thoracic back pain 10/17/2016  . History of smoking 07/13/2016  . Overweight (BMI 25.0-29.9) 07/13/2016  . Counseling on health promotion and disease prevention 07/13/2016  . Encounter for screening for lung cancer 07/13/2016  . Screening for colon cancer 07/13/2016  . Screening for AAA (abdominal aortic aneurysm) 07/13/2016  . Difficulty swallowing 11/26/2015  . Dysphagia 11/26/2015  . Abnormal nuclear cardiac imaging test 08/08/2015  . H/O fatigue with  exertion   . Coronary atherosclerosis of native coronary artery 01/29/2013  . Occlusion and stenosis of carotid artery without mention of cerebral infarction 11/13/2012  . Old myocardial infarction 11/29/2007    Past Medical history, Surgical history, Family history, Social history, Allergies and Medications have been entered into the medical record, reviewed and changed as needed.    No outpatient medications have been marked as taking for the 01/06/17 encounter (Office Visit) with Mellody Dance, DO.   Current Facility-Administered Medications for the 01/06/17 encounter (Office Visit) with Mellody Dance, DO  Medication  . 0.9 %  sodium chloride infusion  . 0.9 %  sodium chloride infusion    Allergies:  Allergies  Allergen Reactions  . Brilinta [Ticagrelor]     Shortness of breath  . Zetia [Ezetimibe] Other (See Comments)    Muscle aches  . Statins Other (See Comments)    Failed Crestor 5 mg twice weekly, Crestor 20 mg daily, Pravastatin 40 mg qd, Lipitor, Zocor - muscle aches Failed Crestor 5 mg twice weekly, Crestor 20 mg daily, Pravastatin 40 mg qd, Lipitor, Zocor - muscle aches     Review of Systems: General:   Denies fever, chills, unexplained weight loss.  Optho/Auditory:   Denies visual changes, blurred vision/LOV Respiratory:   Denies wheeze, DOE more than baseline levels.  Cardiovascular:   Denies chest pain, palpitations, new onset peripheral edema  Gastrointestinal:   Denies nausea, vomiting, diarrhea, abd pain.  Genitourinary: Denies dysuria, freq/ urgency, flank pain or discharge from genitals.  Endocrine:     Denies hot or cold intolerance, polyuria, polydipsia. Musculoskeletal:   Denies unexplained myalgias, joint swelling, unexplained arthralgias, gait problems.  Skin:  Denies new onset rash, suspicious lesions Neurological:     Denies dizziness, unexplained weakness, numbness  Psychiatric/Behavioral:   Denies mood changes, suicidal or homicidal  ideations, hallucinations    Objective:   Blood pressure 130/75, pulse 65, height 5\' 5"  (1.651 m), weight 185 lb (83.9 kg). Body mass index is 30.79 kg/m. General:  Well Developed, well nourished, appropriate for stated age.  Neuro:  Alert and oriented,  extra-ocular muscles intact  HEENT:  Normocephalic, atraumatic, neck supple, no carotid bruits appreciated  Skin:  no gross rash, warm, pink. Cardiac:  RRR, S1 S2 Respiratory:  ECTA B/L and A/P, Not using accessory muscles, speaking in full sentences- unlabored. Vascular:  Ext warm, no cyanosis apprec.; cap RF less 2 sec. Psych:  No HI/SI, judgement and insight good, Euthymic mood. Full Affect.

## 2017-01-06 NOTE — Patient Instructions (Addendum)
-  Last seen by your cardiologist in November 2017 on the second.  Useful to follow-up in 6 months so please call to make a follow-up appointment.  As well when you are discharged from [facility] at the end of September they told her to follow-up within 1 weeks of please call them for follow-up OV.  - Refill amlodipine and any other meds patient needs today.  -Last physical was done in May 2018.  This was a yearly physical.  If you wanted to do a Medicare wellness you can schedule that in the near future.,  These are 2 separate visits.

## 2017-01-07 LAB — CBC WITH DIFFERENTIAL/PLATELET
BASOS: 1 %
Basophils Absolute: 0 10*3/uL (ref 0.0–0.2)
EOS (ABSOLUTE): 0.3 10*3/uL (ref 0.0–0.4)
Eos: 5 %
HEMATOCRIT: 40 % (ref 37.5–51.0)
Hemoglobin: 14.1 g/dL (ref 13.0–17.7)
IMMATURE GRANULOCYTES: 1 %
Immature Grans (Abs): 0.1 10*3/uL (ref 0.0–0.1)
LYMPHS ABS: 2.4 10*3/uL (ref 0.7–3.1)
LYMPHS: 35 %
MCH: 31.1 pg (ref 26.6–33.0)
MCHC: 35.3 g/dL (ref 31.5–35.7)
MCV: 88 fL (ref 79–97)
MONOS ABS: 1 10*3/uL — AB (ref 0.1–0.9)
Monocytes: 15 %
NEUTROS PCT: 43 %
Neutrophils Absolute: 3 10*3/uL (ref 1.4–7.0)
PLATELETS: 224 10*3/uL (ref 150–379)
RBC: 4.54 x10E6/uL (ref 4.14–5.80)
RDW: 13.6 % (ref 12.3–15.4)
WBC: 6.9 10*3/uL (ref 3.4–10.8)

## 2017-01-07 LAB — COMPREHENSIVE METABOLIC PANEL
A/G RATIO: 1.5 (ref 1.2–2.2)
ALK PHOS: 74 IU/L (ref 39–117)
ALT: 40 IU/L (ref 0–44)
AST: 37 IU/L (ref 0–40)
Albumin: 4.5 g/dL (ref 3.6–4.8)
BUN/Creatinine Ratio: 25 — ABNORMAL HIGH (ref 10–24)
BUN: 21 mg/dL (ref 8–27)
Bilirubin Total: 0.5 mg/dL (ref 0.0–1.2)
CALCIUM: 9.5 mg/dL (ref 8.6–10.2)
CHLORIDE: 100 mmol/L (ref 96–106)
CO2: 25 mmol/L (ref 20–29)
Creatinine, Ser: 0.83 mg/dL (ref 0.76–1.27)
GFR calc Af Amer: 106 mL/min/{1.73_m2} (ref >59)
GFR, EST NON AFRICAN AMERICAN: 92 mL/min/{1.73_m2} (ref >59)
Globulin, Total: 3.1 g/dL (ref 1.5–4.5)
Glucose: 74 mg/dL (ref 65–99)
POTASSIUM: 4.6 mmol/L (ref 3.5–5.2)
SODIUM: 140 mmol/L (ref 134–144)
Total Protein: 7.6 g/dL (ref 6.0–8.5)

## 2017-01-07 LAB — PHOSPHORUS: PHOSPHORUS: 3.2 mg/dL (ref 2.5–4.5)

## 2017-01-07 LAB — TSH: TSH: 0.965 u[IU]/mL (ref 0.450–4.500)

## 2017-01-07 LAB — T4, FREE: Free T4: 1.22 ng/dL (ref 0.82–1.77)

## 2017-01-07 LAB — MAGNESIUM: MAGNESIUM: 1.9 mg/dL (ref 1.6–2.3)

## 2017-01-09 ENCOUNTER — Other Ambulatory Visit: Payer: Self-pay | Admitting: Cardiology

## 2017-01-10 DIAGNOSIS — M67952 Unspecified disorder of synovium and tendon, left thigh: Secondary | ICD-10-CM | POA: Diagnosis not present

## 2017-01-12 DIAGNOSIS — M67952 Unspecified disorder of synovium and tendon, left thigh: Secondary | ICD-10-CM | POA: Diagnosis not present

## 2017-01-16 DIAGNOSIS — M67952 Unspecified disorder of synovium and tendon, left thigh: Secondary | ICD-10-CM | POA: Diagnosis not present

## 2017-01-17 ENCOUNTER — Ambulatory Visit: Payer: PPO | Admitting: Cardiology

## 2017-01-17 ENCOUNTER — Encounter: Payer: Self-pay | Admitting: Cardiology

## 2017-01-17 VITALS — BP 110/80 | HR 73 | Ht 65.0 in | Wt 185.2 lb

## 2017-01-17 DIAGNOSIS — I447 Left bundle-branch block, unspecified: Secondary | ICD-10-CM

## 2017-01-17 DIAGNOSIS — I6523 Occlusion and stenosis of bilateral carotid arteries: Secondary | ICD-10-CM | POA: Diagnosis not present

## 2017-01-17 DIAGNOSIS — I2510 Atherosclerotic heart disease of native coronary artery without angina pectoris: Secondary | ICD-10-CM | POA: Diagnosis not present

## 2017-01-17 DIAGNOSIS — I10 Essential (primary) hypertension: Secondary | ICD-10-CM

## 2017-01-17 DIAGNOSIS — Z789 Other specified health status: Secondary | ICD-10-CM | POA: Diagnosis not present

## 2017-01-17 DIAGNOSIS — I739 Peripheral vascular disease, unspecified: Secondary | ICD-10-CM | POA: Diagnosis not present

## 2017-01-17 MED ORDER — LISINOPRIL 10 MG PO TABS: 10.0000 mg | ORAL_TABLET | Freq: Every day | ORAL | 3 refills | Status: DC

## 2017-01-17 NOTE — Progress Notes (Signed)
[facility] [place]. [address], [address] [place], [place]  [address] Phone: [phone] Fax:  [phone]  Date:  01/17/2017   ID:  [name], DOB [birth year], MRN [id number]  PCP:  Mellody Dance, DO   History of Present Illness: [name] is a 66 y.o. male with CAD, 99% circumflex stent placed in June of 2017 here for follow up.   Prior cardiac catheterization in October of 2009 following mildly positive troponin (isolated blood draw), mild non-ST elevation MI which revealed 60% right coronary artery stenosis.  However, chest pain returned, even in Dec of 2016 when doing yard work, went to ER uneventful workup then. Still felt like "I am going to die"- eval. NUC stress this time 06/2015: Moderate size and intensity fixed apical and inferolateral perfusion defect, likely scar. No reversible ischemia. LVEF 41% with inferolateral akinesis. Submaximal exercise, study converted to lexiscan due to DOE. This is an intermediate risk study.  We discussed implications of the stress test, potential changes in his right coronary artery anatomy. Went for cardiac catheterization.  He therefore went to [facility] and had cardiac catheterization performed which demonstrated a 99% circumflex stenosis successfully treated with DES. He also had residual 60% distal RCA. EF was normal. Cardiac catheterization took place on 08/07/15.  After probably 7 days of feeling great after stent, he began to feel more short of breath, flushed, feeling overweight. He attributes this to his Brilinta. This was changed to Plavix. Improved.   Previously, had right carotid endarterectomy, amaurosis fugax. He was upset about the increase in cost of vascular ultrasound.  He is had issues in the past tolerating statin therapy, Crestor included. He is currently taking Pralulent. 50% reduction in LDL. Even with approval from his insurance company the medication was $1000 a month. He is unable to take this.  Difficulty with  dysphagia, swallowing steak for instance. GI evaluation was performed. Barium swallow. It was requested that he have a potential esophageal dilatation. At this point, he should not proceed since he is less than 12 months post drug-eluting stent placement. He is not having the chest pain, no shortness of breath, no syncope. He does bruise easily. He is playing golf 5 days a week.  [name] wife.  01/17/17  - Left hip surgery, tendon repair at [facility]. Lasted longer than expected. Woke up in [facility] ER. BP dropped, HR dropped. Off Plavix.  Ejection fraction was normal on 11/22/16.  Left bundle branch block is new on EKG today.  Overall he is doing well currently.  No chest pain, no syncope, no bleeding.   Wt Readings from Last 3 Encounters:  01/17/17 185 lb 3.2 oz (84 kg)  01/06/17 185 lb (83.9 kg)  12/13/16 178 lb (80.7 kg)     Past Medical History:  Diagnosis Date  . Arthritis   . Carotid artery occlusion   . Coronary atherosclerosis of native coronary artery 01/29/2013  . Duodenal erosion   . H. pylori infection   . Heart attack [name]) Oct. 2009   Mild  . Hiatal hernia   . Hyperlipidemia   . Hypertension   . Shortness of breath dyspnea     Past Surgical History:  Procedure Laterality Date  . APPENDECTOMY    . CARDIAC CATHETERIZATION N/A 08/07/2015   Procedure: Left Heart Cath and Coronary Angiography;  Surgeon: [name], MD;  Location: [place] [facility];  Service: Cardiovascular;  Laterality: N/A;  . CARDIAC CATHETERIZATION N/A 08/07/2015  Procedure: Coronary Stent Intervention;  Surgeon: [name], MD;  Location: [facility];  Service: Cardiovascular;  Laterality: N/A;  . CARDIAC CATHETERIZATION N/A 08/07/2015   Procedure: Coronary Stent Intervention;  Surgeon: [name], MD;  Location: [place];  Service: Cardiovascular;  Laterality: N/A;  . CAROTID ENDARTERECTOMY  01/05/2006   Right  CEA with DPA  . CORONARY STENT PLACEMENT  08/07/2015   [facility]  .  SPINE SURGERY      Current Outpatient Medications  Medication Sig Dispense Refill  . acetaminophen-codeine (TYLENOL #3) 300-30 MG tablet Take 1 tablet by mouth every 6 (six) hours as needed. For pain  0  . AMBULATORY NON FORMULARY MEDICATION Take 180 mg by mouth daily. Medication Name: bempedoic acid vs placebo. CLEAR Research study drug provided    . amLODipine (NORVASC) 5 MG tablet Take 1 tablet (5 mg total) daily by mouth. 90 tablet 0  . aspirin EC 81 MG tablet Take 1 tablet (81 mg total) by mouth daily. 90 tablet 3  . celecoxib (CELEBREX) 200 MG capsule Take 1 capsule once a week by mouth.     . cyclobenzaprine (FLEXERIL) 10 MG tablet Take 1 tablet (10 mg total) by mouth at bedtime. 15 tablet 0  . lisinopril (PRINIVIL,ZESTRIL) 10 MG tablet Take 1 tablet (10 mg total) by mouth daily. 90 tablet 3  . meloxicam (MOBIC) 7.5 MG tablet Take 2 tablets (15 mg total) by mouth daily. Take 1-2 tablets daily by mouth for severe joint pains 180 tablet 0  . metoprolol succinate (TOPROL-XL) 25 MG 24 hr tablet TAKE 1 TABLET BY MOUTH ONCE DAILY. 90 tablet 2  . nitroGLYCERIN (NITROSTAT) 0.4 MG SL tablet Place 1 tablet (0.4 mg total) under the tongue every 5 (five) minutes as needed for chest pain. 25 tablet 2  . pantoprazole (PROTONIX) 40 MG tablet Take 1 tablet (40 mg total) by mouth daily. 90 tablet 3  . rosuvastatin (CRESTOR) 10 MG tablet Take 10 mg by mouth once a week.    . traMADol (ULTRAM) 50 MG tablet Take 50 mg by mouth every 6 (six) hours as needed for moderate pain.      Current Facility-Administered Medications  Medication Dose Route Frequency Provider Last Rate Last Dose  . 0.9 %  sodium chloride infusion  500 mL Intravenous Continuous Lucio Edward T, MD      . 0.9 %  sodium chloride infusion  500 mL Intravenous Continuous Ladene Artist, MD        Allergies:    Allergies  Allergen Reactions  . Brilinta [Ticagrelor]     Shortness of breath  . Statins Other (See Comments)    Failed  Crestor 5 mg twice weekly, Crestor 20 mg daily, Pravastatin 40 mg qd, Lipitor, Zocor - muscle aches  . Zetia [Ezetimibe] Other (See Comments)    Muscle aches    Social History:  The patient  reports that he quit smoking about 11 years ago. His smoking use included cigarettes. He has a 25.00 pack-year smoking history. His smokeless tobacco use includes snuff. He reports that he drinks about 3.0 - 3.6 oz of alcohol per week. He reports that he does not use drugs.   ROS:  Please see the history of present illness.  Denies any fevers chills orthopnea PND syncope  PHYSICAL EXAM: VS:  BP 110/80   Pulse 73   Ht 5\' 5"  (1.651 m)   Wt 185 lb 3.2 oz (84 kg)  SpO2 97%   BMI 30.82 kg/m  GEN: Well nourished, well developed, in no acute distress  HEENT: normal  Neck: no JVD, carotid bruits, or masses Cardiac: RRR; no murmurs, rubs, or gallops,no edema  Respiratory:  clear to auscultation bilaterally, normal work of breathing GI: soft, nontender, nondistended, + BS MS: no deformity or atrophy  Skin: warm and dry, no rash Neuro:  Alert and Oriented x 3, Strength and sensation are intact Psych: euthymic mood, full affect   EKG:   01/17/17-sinus rhythm 73 with left bundle branch block personally viewed-prior 02/03/15-sinus rhythm, 64, nonspecific T-wave changes. 01/29/14-normal sinus rhythm, 64, nonspecific T-wave changes, flattening. Prior 01/29/13: Sinus rhythm rate 68 with nonspecific T-wave changes.     NUC stress test 07/06/15: Moderate size and intensity fixed apical and inferolateral perfusion defect, likely scar. No reversible ischemia. LVEF 41% with inferolateral akinesis. Submaximal exercise, study converted to lexiscan due to DOE. This is an intermediate risk study.   Cardiac catheterization 08/07/15: however showed a 99% circumflex lesion treated with DES, 60% RCA, normal ejection fraction.  Echo on 11/22/16-[facility]-moderate LVH, normal EF, mild MR  ASSESSMENT AND PLAN:  Coronary  artery disease   - Cardiac catheterization June 2017 demonstrated 99% circumflex lesion treated with DES. After approximate a week he felt great. Then he started to feel shortness of breath with his Brilinta.   - Changed him from Brilinta over to Plavix.   - He is in twilight study.  - Nuclear stress test showed 41% EF however cardiac catheterization showed normal EF.  -Last echo performed at [facility] showed normal ejection fraction.  Left bundle branch block noted on ECG 01/17/17.  Peripheral vascular disease/carotid artery disease -prior right sided carotid endarterectomy.  Stable, no change  Hypertension -  Multidrug regimen. Controlled today Continuing to work with both me and Dr. Harrington Challenger. Continue current medications  Hyperlipidemia -Praulent has been re-approved.  However $1000 a month is too much for medical therapy for him. He is not taking. He is trying to take the Crestor a few times a week. Continue to encourage.  -He has had prior cramps at night, trying homeopathic remedies such as mustard.    6 month follow-up  Signed, Candee Furbish, MD [facility]  01/17/2017 3:46 PM

## 2017-01-17 NOTE — Patient Instructions (Signed)

## 2017-01-24 ENCOUNTER — Other Ambulatory Visit: Payer: Self-pay | Admitting: Adult Health

## 2017-01-24 NOTE — Telephone Encounter (Signed)
Please review and refill if appropriate.  T. Trueman Worlds, CMA  

## 2017-01-25 DIAGNOSIS — M67952 Unspecified disorder of synovium and tendon, left thigh: Secondary | ICD-10-CM | POA: Diagnosis not present

## 2017-01-27 DIAGNOSIS — M67952 Unspecified disorder of synovium and tendon, left thigh: Secondary | ICD-10-CM | POA: Diagnosis not present

## 2017-01-31 DIAGNOSIS — M67952 Unspecified disorder of synovium and tendon, left thigh: Secondary | ICD-10-CM | POA: Diagnosis not present

## 2017-02-02 DIAGNOSIS — M67952 Unspecified disorder of synovium and tendon, left thigh: Secondary | ICD-10-CM | POA: Diagnosis not present

## 2017-02-09 DIAGNOSIS — M67952 Unspecified disorder of synovium and tendon, left thigh: Secondary | ICD-10-CM | POA: Diagnosis not present

## 2017-02-14 DIAGNOSIS — M67952 Unspecified disorder of synovium and tendon, left thigh: Secondary | ICD-10-CM | POA: Diagnosis not present

## 2017-02-16 DIAGNOSIS — M67952 Unspecified disorder of synovium and tendon, left thigh: Secondary | ICD-10-CM | POA: Diagnosis not present

## 2017-02-23 DIAGNOSIS — M67952 Unspecified disorder of synovium and tendon, left thigh: Secondary | ICD-10-CM | POA: Diagnosis not present

## 2017-03-01 DIAGNOSIS — M67952 Unspecified disorder of synovium and tendon, left thigh: Secondary | ICD-10-CM | POA: Diagnosis not present

## 2017-03-07 DIAGNOSIS — M76892 Other specified enthesopathies of left lower limb, excluding foot: Secondary | ICD-10-CM | POA: Diagnosis not present

## 2017-03-09 ENCOUNTER — Ambulatory Visit: Payer: PPO | Admitting: Cardiology

## 2017-03-16 ENCOUNTER — Telehealth: Payer: Self-pay | Admitting: *Deleted

## 2017-03-16 NOTE — Telephone Encounter (Signed)
Spoke with subject for visit T7-M15 in the [facility] Research Study.  No c/o, aes or saes to report.  No medicine changes to report.  Next clinic visit confirmed.

## 2017-03-20 DIAGNOSIS — M9905 Segmental and somatic dysfunction of pelvic region: Secondary | ICD-10-CM | POA: Diagnosis not present

## 2017-03-20 DIAGNOSIS — M9903 Segmental and somatic dysfunction of lumbar region: Secondary | ICD-10-CM | POA: Diagnosis not present

## 2017-03-20 DIAGNOSIS — M5136 Other intervertebral disc degeneration, lumbar region: Secondary | ICD-10-CM | POA: Diagnosis not present

## 2017-03-20 DIAGNOSIS — M9904 Segmental and somatic dysfunction of sacral region: Secondary | ICD-10-CM | POA: Diagnosis not present

## 2017-04-01 ENCOUNTER — Other Ambulatory Visit: Payer: Self-pay | Admitting: Family Medicine

## 2017-04-01 DIAGNOSIS — I10 Essential (primary) hypertension: Secondary | ICD-10-CM

## 2017-04-27 DIAGNOSIS — L812 Freckles: Secondary | ICD-10-CM | POA: Diagnosis not present

## 2017-04-27 DIAGNOSIS — L821 Other seborrheic keratosis: Secondary | ICD-10-CM | POA: Diagnosis not present

## 2017-04-27 DIAGNOSIS — D225 Melanocytic nevi of trunk: Secondary | ICD-10-CM | POA: Diagnosis not present

## 2017-04-27 DIAGNOSIS — L570 Actinic keratosis: Secondary | ICD-10-CM | POA: Diagnosis not present

## 2017-05-01 ENCOUNTER — Ambulatory Visit (INDEPENDENT_AMBULATORY_CARE_PROVIDER_SITE_OTHER): Payer: PPO | Admitting: Family Medicine

## 2017-05-01 ENCOUNTER — Encounter: Payer: Self-pay | Admitting: Family Medicine

## 2017-05-01 VITALS — BP 137/87 | HR 64 | Ht 65.0 in | Wt 192.0 lb

## 2017-05-01 DIAGNOSIS — I2510 Atherosclerotic heart disease of native coronary artery without angina pectoris: Secondary | ICD-10-CM | POA: Diagnosis not present

## 2017-05-01 DIAGNOSIS — I252 Old myocardial infarction: Secondary | ICD-10-CM | POA: Diagnosis not present

## 2017-05-01 DIAGNOSIS — Z9861 Coronary angioplasty status: Secondary | ICD-10-CM

## 2017-05-01 DIAGNOSIS — Z87891 Personal history of nicotine dependence: Secondary | ICD-10-CM

## 2017-05-01 DIAGNOSIS — M79671 Pain in right foot: Secondary | ICD-10-CM | POA: Diagnosis not present

## 2017-05-01 DIAGNOSIS — M25552 Pain in left hip: Secondary | ICD-10-CM | POA: Diagnosis not present

## 2017-05-01 DIAGNOSIS — I739 Peripheral vascular disease, unspecified: Secondary | ICD-10-CM

## 2017-05-01 DIAGNOSIS — E782 Mixed hyperlipidemia: Secondary | ICD-10-CM | POA: Diagnosis not present

## 2017-05-01 DIAGNOSIS — I10 Essential (primary) hypertension: Secondary | ICD-10-CM | POA: Diagnosis not present

## 2017-05-01 DIAGNOSIS — E669 Obesity, unspecified: Secondary | ICD-10-CM

## 2017-05-01 DIAGNOSIS — Z7189 Other specified counseling: Secondary | ICD-10-CM | POA: Diagnosis not present

## 2017-05-01 DIAGNOSIS — G8929 Other chronic pain: Secondary | ICD-10-CM

## 2017-05-01 DIAGNOSIS — I6529 Occlusion and stenosis of unspecified carotid artery: Secondary | ICD-10-CM

## 2017-05-01 DIAGNOSIS — M79672 Pain in left foot: Secondary | ICD-10-CM

## 2017-05-01 NOTE — Assessment & Plan Note (Signed)
Recent low-dose CT scan was done on 07/26/2016.  Stable, little change from prior.   Told to repeat in 1 year.  Due to continue until 2022, which by then will be quit for 15 years

## 2017-05-01 NOTE — Progress Notes (Signed)
Impression and Recommendations:    1. 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 Mixed hyperlipidemia   3. Essential hypertension   4. Stenosis of carotid artery, unspecified laterality   5. CAD S/P CFX DES 08/07/15   6. Chronic left hip pain   7. Old myocardial infarction   8. Obesity (BMI 30.0-34.9)   9. History of smoking   10. Atherosclerosis of native coronary artery of native heart, angina presence unspecified   11. Counseling on health promotion and disease prevention   12. Foot pain, bilateral- chronic many yrs- seen podiatry in past     1. 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 Donnetta Hutching, vascular surgeon for further evaluation after discussion of his symptoms, this needs to be ruled out.   2. Mixed HLD- pt is intolerant to statins. Pt has been on a clinical trial medication at [facility] with the lipid clinic for about 1 year. Pt has a follow up in near future with the Lipid clinic who is watching him closely. They do routine blood work for him. He is stable at this time and asymptomatic.  3. Essential HTN- BP is above goal but he is being treated by cardiology, Dr. Marlou Porch. Pt is stable at this time. No change to medication at this time- continue as described below.  Goal: Have BP consistently < 135/85.  4. Stenosis of carotid artery- under surveillance by Dr. Donnetta Hutching, vascular surgeon. His last OV with Dr. Marlou Porch in Nov 2018.  5. CAD S/P CFX DES 08/07/15- pt sees Dr. Marlou Porch, caridology. Off plavix since recent hip surgery in Oct 2018. Follows with cardiology yearly or more frequently as needed.  6. Chronic L hip pain- S/p surgery by orthopedic surgeon, Dr. Kara Dies, at [facility]. He is released from them and stable at this time.  7. Old MI- follow up with cardiology. Pt understands he is higher risk for peripheral arterial disease due to history.  8. Overweight- continue losing weight and exercising daily.  9. H/o  smoking- continue not smoking.  10. Atherosclerosis of native coronary artery of native heart- under the care of cardiology. Pt is stable, asymptomatic. Active and doing well.   11. Counseling on health promotion and disease prevention- done in office.   12. Foot pain- bilateral. He will reach out to [place] if he wants a referral. OK for referral to podiatry for bilateral foot pain.    -Continue exercising at the gym regularly. AHA exercise guidelines discussed. Avoid walking on unforgiving surfaces.  -Stretch calves, hamstrings, hip muscles, and quad muscles regularly before and after exercising.   No orders of the defined types were placed in this encounter.   No orders of the defined types were placed in this encounter.   Gross side effects, risk and benefits, and alternatives of medications and treatment plan in general discussed with patient.  Patient is aware that all medications have potential side effects and we are unable to predict every side effect or drug-drug interaction that may occur.   Patient will call with any questions prior to using medication if they have concerns.  Expresses verbal understanding and consents to current therapy and treatment regimen.  No barriers to understanding were identified.  Red flag symptoms and signs discussed in detail.  Patient expressed understanding regarding what to do in case of emergency\urgent symptoms  Please see AVS handed out to patient at the end of our visit for further patient instructions/ counseling  done pertaining to today's office visit.   Return for for yrly PE and additionally for chronic f/up 4-58mo.    Note: This note was prepared with assistance of Dragon voice recognition software. Occasional wrong-word or sound-a-like substitutions may have occurred due to the inherent limitations of voice recognition software.  This document serves as a record of services personally performed by Mellody Dance, DO. It was created on her  behalf by Mayer Masker, a trained medical scribe. The creation of this record is based on the scribe's personal observations and the provider's statements to them.   I have reviewed the above medical documentation for accuracy and completeness and I concur.  Mellody Dance 05/01/17 1:37 PM  --------------------------------------------------------------------------------------------------------------------------------------------------------------------------------------------------------------------------------------------    Subjective:     HPI: [name] is a 67 y.o. male who presents to [facility] today for issues as discussed below.  He had surgery 6 months ago at [facility] for his hip. He reports his hip is better, but has numbness in his muscle from the surgery. He went to physical therapy. The surgeon has released him, he has a 3 month follow up soon with them.  HLD- pt has been on a trial medication at [facility] for 1 year. He gets routine blood work with them at the [facility]. Pt is intolerant to statins.  Feet:  He has chronic pain in his bilateral feet. He denies pain in his heel but along the middle aspect of his foot and toes. This has been going on for "years" but has worsened recently. He used to walk 3-4 miles every morning. He describes this as hurting "like hell". If he is walking after a certain distance (161 yards), he has to stop due to the pain. The pain is relieved with rest for 20-30 seconds and returns when he walks again. He has gained 7-8 lbs recently and wants to lose this weight. However, pt states he walked on the treadmill this morning and rode on an elliptical for 4-5 minutes and did not have any pain or issue. He states when he plays golf he has pain when moving around as well and reports limping due to the pain. Pt has podiatrist (at [facility]) and has worn orthotics in the past. He has iced his legs and feet periodically.   Pt  has a h/o vascular disease. Dr. Marlou Porch is his cardiologist. Dr. Donnetta Hutching and Vinnie Level Nickel are on his care team for vascular surgery. Pt is not currently on Plavix and went off this in Aug 2018 per Dr. Marlou Porch. His last OV with Dr. Marlou Porch was Nov 2018.   Leg: Pain in bilateral calves that began since surgery- he has not told his orthopedic surgeon about this. He has "not done anything to make it sore". He has not used any new shoes.   HTN He is going to the gym regularly. He walks on the treadmill and elliptical (for 4-5 minutes on each machine) with some weight lifting for about 35 minutes at a time.   Wt Readings from Last 3 Encounters:  05/01/17 192 lb (87.1 kg)  01/17/17 185 lb 3.2 oz (84 kg)  01/06/17 185 lb (83.9 kg)   BP Readings from Last 3 Encounters:  05/01/17 137/87  01/17/17 110/80  01/06/17 130/75   Pulse Readings from Last 3 Encounters:  05/01/17 64  01/17/17 73  01/06/17 65   BMI Readings from Last 3 Encounters:  05/01/17 31.95 kg/m  01/17/17 30.82 kg/m  01/06/17 30.79 kg/m     Patient Care Team    Relationship Specialty Notifications Start End  Mellody Dance, DO PCP - General Family Medicine  09/14/15   Ladene Artist, MD Consulting Physician Gastroenterology  07/07/16   [name], MD Consulting Physician Cardiology  07/07/16   Chari Manning, MD Attending Physician Orthopedic Surgery  09/20/16   Nickel, Sharmon Leyden, NP Nurse Practitioner Vascular Surgery  01/06/17    Comment: carotid dx     Patient Active Problem List   Diagnosis Date Noted  . Chronic anticoagulation: plavix 10/15/2015    Priority: High  . CAD S/P CFX DES 08/07/15 08/08/2015    Priority: High  . Essential hypertension 08/08/2015    Priority: High  . Hyperlipidemia 01/29/2013    Priority: High  . Carotid stenosis-s/p RCE 11/08/2011    Priority: High  . Chronic left hip pain 09/15/2015    Priority: Medium  . Greater trochanteric bursitis of left hip 09/15/2015     Priority: Medium  . 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 Foot pain, bilateral- chronic many yrs 05/01/2017  . Disorder of tendon 03/01/2017  . Left-sided thoracic back pain 10/17/2016  . History of smoking 07/13/2016  . Overweight (BMI 25.0-29.9) 07/13/2016  . Counseling on health promotion and disease prevention 07/13/2016  . Encounter for screening for lung cancer 07/13/2016  . Screening for colon cancer 07/13/2016  . Screening for AAA (abdominal aortic aneurysm) 07/13/2016  . Difficulty swallowing 11/26/2015  . Dysphagia 11/26/2015  . Abnormal nuclear cardiac imaging test 08/08/2015  . H/O fatigue with exertion   . Coronary atherosclerosis of native coronary artery 01/29/2013  . Occlusion and stenosis of carotid artery without mention of cerebral infarction 11/13/2012  . Old myocardial infarction 11/29/2007    Past Medical history, Surgical history, Family history, Social history, Allergies and Medications have been entered into the medical record, reviewed and changed as needed.    Current Meds  Medication Sig  . acetaminophen-codeine (TYLENOL #3) 300-30 MG tablet Take 1 tablet by mouth every 6 (six) hours as needed. For pain  . AMBULATORY NON FORMULARY MEDICATION Take 180 mg by mouth daily. Medication Name: bempedoic acid vs placebo. CLEAR Research study drug provided  . amLODipine (NORVASC) 5 MG tablet TAKE 1 TABLET BY MOUTH DAILY  . aspirin EC 81 MG tablet Take 1 tablet (81 mg total) by mouth daily.  . celecoxib (CELEBREX) 200 MG capsule Take 1 capsule once a week by mouth.   . cyclobenzaprine (FLEXERIL) 10 MG tablet TAKE 1 TABLET BY MOUTH AT BEDTIME.  Marland Kitchen lisinopril (PRINIVIL,ZESTRIL) 10 MG tablet Take 1 tablet (10 mg total) by mouth daily.  . meloxicam (MOBIC) 7.5 MG tablet Take 2 tablets (15 mg total) by mouth daily. Take 1-2 tablets daily by mouth for severe joint pains  . metoprolol succinate (TOPROL-XL) 25 MG 24 hr tablet TAKE 1  TABLET BY MOUTH ONCE DAILY.  . nitroGLYCERIN (NITROSTAT) 0.4 MG SL tablet Place 1 tablet (0.4 mg total) under the tongue every 5 (five) minutes as needed for chest pain.  . pantoprazole (PROTONIX) 40 MG tablet Take 1 tablet (40 mg total) by mouth daily.  . rosuvastatin (CRESTOR) 10 MG tablet Take 10 mg by mouth once a week.  . traMADol (ULTRAM) 50 MG tablet Take 50 mg by mouth every 6 (six) hours as needed for moderate pain.    Current Facility-Administered Medications for the 05/01/17 encounter (Office Visit) with Mellody Dance,  DO  Medication  . 0.9 %  sodium chloride infusion  . 0.9 %  sodium chloride infusion    Allergies:  Allergies  Allergen Reactions  . Brilinta [Ticagrelor]     Shortness of breath  . Zetia [Ezetimibe] Other (See Comments)    Muscle aches  . Statins Other (See Comments)    Failed Crestor 5 mg twice weekly, Crestor 20 mg daily, Pravastatin 40 mg qd, Lipitor, Zocor - muscle aches Failed Crestor 5 mg twice weekly, Crestor 20 mg daily, Pravastatin 40 mg qd, Lipitor, Zocor - muscle aches     Review of Systems:  A fourteen system review of systems was performed and found to be positive as per HPI.   Objective:   Blood pressure 137/87, pulse 64, height 5\' 5"  (1.651 m), weight 192 lb (87.1 kg), SpO2 98 %. Body mass index is 31.95 kg/m. General:  Well Developed, well nourished, appropriate for stated age.  Neuro:  Alert and oriented,  extra-ocular muscles intact  HEENT:  Normocephalic, atraumatic, neck supple, no carotid bruits appreciated  Skin:  no gross rash, warm, pink. Cardiac:  RRR, S1 S2 Respiratory:  ECTA B/L and A/P, Not using accessory muscles, speaking in full sentences- unlabored. Vascular:  Ext warm, no cyanosis apprec.; cap RF less 2 sec. Psych:  No HI/SI, judgement and insight good, Euthymic mood. Full Affect.

## 2017-05-01 NOTE — Patient Instructions (Signed)
Please reck his BP before he leaves the office today and document.    DASH Eating Plan DASH stands for "Dietary Approaches to Stop Hypertension." The DASH eating plan is a healthy eating plan that has been shown to reduce high blood pressure (hypertension). It may also reduce your risk for type 2 diabetes, heart disease, and stroke. The DASH eating plan may also help with weight loss. What are tips for following this plan? General guidelines  Avoid eating more than 2,300 mg (milligrams) of salt (sodium) a day. If you have hypertension, you may need to reduce your sodium intake to 1,500 mg a day.  Limit alcohol intake to no more than 1 drink a day for nonpregnant women and 2 drinks a day for men. One drink equals 12 oz of beer, 5 oz of wine, or 1 oz of hard liquor.  Work with your health care provider to maintain a healthy body weight or to lose weight. Ask what an ideal weight is for you.  Get at least 30 minutes of exercise that causes your heart to beat faster (aerobic exercise) most days of the week. Activities may include walking, swimming, or biking.  Work with your health care provider or diet and nutrition specialist (dietitian) to adjust your eating plan to your individual calorie needs. Reading food labels  Check food labels for the amount of sodium per serving. Choose foods with less than 5 percent of the Daily Value of sodium. Generally, foods with less than 300 mg of sodium per serving fit into this eating plan.  To find whole grains, look for the word "whole" as the first word in the ingredient list. Shopping  Buy products labeled as "low-sodium" or "no salt added."  Buy fresh foods. Avoid canned foods and premade or frozen meals. Cooking  Avoid adding salt when cooking. Use salt-free seasonings or herbs instead of table salt or sea salt. Check with your health care provider or pharmacist before using salt substitutes.  Do not fry foods. Cook foods using healthy methods  such as baking, boiling, grilling, and broiling instead.  Cook with heart-healthy oils, such as olive, canola, soybean, or sunflower oil. Meal planning   Eat a balanced diet that includes: ? 5 or more servings of fruits and vegetables each day. At each meal, try to fill half of your plate with fruits and vegetables. ? Up to 6-8 servings of whole grains each day. ? Less than 6 oz of lean meat, poultry, or fish each day. A 3-oz serving of meat is about the same size as a deck of cards. One egg equals 1 oz. ? 2 servings of low-fat dairy each day. ? A serving of nuts, seeds, or beans 5 times each week. ? Heart-healthy fats. Healthy fats called Omega-3 fatty acids are found in foods such as flaxseeds and coldwater fish, like sardines, salmon, and mackerel.  Limit how much you eat of the following: ? Canned or prepackaged foods. ? Food that is high in trans fat, such as fried foods. ? Food that is high in saturated fat, such as fatty meat. ? Sweets, desserts, sugary drinks, and other foods with added sugar. ? Full-fat dairy products.  Do not salt foods before eating.  Try to eat at least 2 vegetarian meals each week.  Eat more home-cooked food and less restaurant, buffet, and fast food.  When eating at a restaurant, ask that your food be prepared with less salt or no salt, if possible. What foods are recommended?  The items listed may not be a complete list. Talk with your dietitian about what dietary choices are best for you. Grains Whole-grain or whole-wheat bread. Whole-grain or whole-wheat pasta. Brown rice. [place]. Bulgur. Whole-grain and low-sodium cereals. Pita bread. Low-fat, low-sodium crackers. Whole-wheat flour tortillas. Vegetables Fresh or frozen vegetables (raw, steamed, roasted, or grilled). Low-sodium or reduced-sodium tomato and vegetable juice. Low-sodium or reduced-sodium tomato sauce and tomato paste. Low-sodium or reduced-sodium canned vegetables. Fruits All  fresh, dried, or frozen fruit. Canned fruit in natural juice (without added sugar). Meat and other protein foods Skinless chicken or [place]. Ground chicken or [place]. Pork with fat trimmed off. Fish and seafood. Egg whites. Dried beans, peas, or lentils. Unsalted nuts, nut butters, and seeds. Unsalted canned beans. Lean cuts of beef with fat trimmed off. Low-sodium, lean deli meat. Dairy Low-fat (1%) or fat-free (skim) milk. Fat-free, low-fat, or reduced-fat cheeses. Nonfat, low-sodium ricotta or cottage cheese. Low-fat or nonfat yogurt. Low-fat, low-sodium cheese. Fats and oils Soft margarine without trans fats. Vegetable oil. Low-fat, reduced-fat, or light mayonnaise and salad dressings (reduced-sodium). Canola, safflower, olive, soybean, and sunflower oils. Avocado. Seasoning and other foods Herbs. Spices. Seasoning mixes without salt. Unsalted popcorn and pretzels. Fat-free sweets. What foods are not recommended? The items listed may not be a complete list. Talk with your dietitian about what dietary choices are best for you. Grains Baked goods made with fat, such as croissants, muffins, or some breads. Dry pasta or rice meal packs. Vegetables Creamed or fried vegetables. Vegetables in a cheese sauce. Regular canned vegetables (not low-sodium or reduced-sodium). Regular canned tomato sauce and paste (not low-sodium or reduced-sodium). Regular tomato and vegetable juice (not low-sodium or reduced-sodium). Angie Fava. Olives. Fruits Canned fruit in a light or heavy syrup. Fried fruit. Fruit in cream or butter sauce. Meat and other protein foods Fatty cuts of meat. Ribs. Fried meat. Berniece Salines. Sausage. Bologna and other processed lunch meats. Salami. Fatback. Hotdogs. Bratwurst. Salted nuts and seeds. Canned beans with added salt. Canned or smoked fish. Whole eggs or egg yolks. Chicken or [place] with skin. Dairy Whole or 2% milk, cream, and half-and-half. Whole or full-fat cream cheese. Whole-fat or  sweetened yogurt. Full-fat cheese. Nondairy creamers. Whipped toppings. Processed cheese and cheese spreads. Fats and oils Butter. Stick margarine. Lard. Shortening. Ghee. Bacon fat. Tropical oils, such as coconut, palm kernel, or palm oil. Seasoning and other foods Salted popcorn and pretzels. Onion salt, garlic salt, seasoned salt, table salt, and sea salt. Worcestershire sauce. Tartar sauce. Barbecue sauce. Teriyaki sauce. Soy sauce, including reduced-sodium. Steak sauce. Canned and packaged gravies. Fish sauce. Oyster sauce. Cocktail sauce. Horseradish that you find on the shelf. Ketchup. Mustard. Meat flavorings and tenderizers. Bouillon cubes. Hot sauce and Tabasco sauce. Premade or packaged marinades. Premade or packaged taco seasonings. Relishes. Regular salad dressings. Where to find more information:  National Heart, Lung, and [place]: https://wilson-eaton.com/  American Heart Association: www.heart.org Summary  The DASH eating plan is a healthy eating plan that has been shown to reduce high blood pressure (hypertension). It may also reduce your risk for type 2 diabetes, heart disease, and stroke.  With the DASH eating plan, you should limit salt (sodium) intake to 2,300 mg a day. If you have hypertension, you may need to reduce your sodium intake to 1,500 mg a day.  When on the DASH eating plan, aim to eat more fresh fruits and vegetables, whole grains, lean proteins, low-fat dairy, and heart-healthy fats.  Work with your health care  provider or diet and nutrition specialist (dietitian) to adjust your eating plan to your individual calorie needs. This information is not intended to replace advice given to you by your health care provider. Make sure you discuss any questions you have with your health care provider. Document Released: 02/03/2011 Document Revised: 02/08/2016 Document Reviewed: 02/08/2016 Elsevier Interactive Patient Education  2018 [employer]. Hypertension Hypertension is another name for high blood pressure. High blood pressure forces your heart to work harder to pump blood. This can cause problems over time. There are two numbers in a blood pressure reading. There is a top number (systolic) over a bottom number (diastolic). It is best to have a blood pressure below 120/80. Healthy choices can help lower your blood pressure. You may need medicine to help lower your blood pressure if:  Your blood pressure cannot be lowered with healthy choices.  Your blood pressure is higher than 130/80.  Follow these instructions at home: Eating and drinking  If directed, follow the DASH eating plan. This diet includes: ? Filling half of your plate at each meal with fruits and vegetables. ? Filling one quarter of your plate at each meal with whole grains. Whole grains include whole wheat pasta, brown rice, and whole grain bread. ? Eating or drinking low-fat dairy products, such as skim milk or low-fat yogurt. ? Filling one quarter of your plate at each meal with low-fat (lean) proteins. Low-fat proteins include fish, skinless chicken, eggs, beans, and tofu. ? Avoiding fatty meat, cured and processed meat, or chicken with skin. ? Avoiding premade or processed food.  Eat less than 1,500 mg of salt (sodium) a day.  Limit alcohol use to no more than 1 drink a day for nonpregnant women and 2 drinks a day for men. One drink equals 12 oz of beer, 5 oz of wine, or 1 oz of hard liquor. Lifestyle  Work with your doctor to stay at a healthy weight or to lose weight. Ask your doctor what the best weight is for you.  Get at least 30 minutes of exercise that causes your heart to beat faster (aerobic exercise) most days of the week. This may include walking, swimming, or biking.  Get at least 30 minutes of exercise that strengthens your muscles (resistance exercise) at least 3 days a week. This may include lifting weights or pilates.  Do not use any  products that contain nicotine or tobacco. This includes cigarettes and e-cigarettes. If you need help quitting, ask your doctor.  Check your blood pressure at home as told by your doctor.  Keep all follow-up visits as told by your doctor. This is important. Medicines  Take over-the-counter and prescription medicines only as told by your doctor. Follow directions carefully.  Do not skip doses of blood pressure medicine. The medicine does not work as well if you skip doses. Skipping doses also puts you at risk for problems.  Ask your doctor about side effects or reactions to medicines that you should watch for. Contact a doctor if:  You think you are having a reaction to the medicine you are taking.  You have headaches that keep coming back (recurring).  You feel dizzy.  You have swelling in your ankles.  You have trouble with your vision. Get help right away if:  You get a very bad headache.  You start to feel confused.  You feel weak or numb.  You feel faint.  You get very bad pain in your: ? Chest. ? Belly (  abdomen).  You throw up (vomit) more than once.  You have trouble breathing. Summary  Hypertension is another name for high blood pressure.  Making healthy choices can help lower blood pressure. If your blood pressure cannot be controlled with healthy choices, you may need to take medicine. This information is not intended to replace advice given to you by your health care provider. Make sure you discuss any questions you have with your health care provider. Document Released: 08/03/2007 Document Revised: 01/13/2016 Document Reviewed: 01/13/2016 Elsevier Interactive Patient Education  [employer].

## 2017-05-02 ENCOUNTER — Telehealth: Payer: Self-pay | Admitting: Family Medicine

## 2017-05-02 NOTE — Telephone Encounter (Signed)
No patient was told to schedule a follow-up appointment with his vascular doctor to discuss his claudication symptoms, and see if they advise further testing.    I never told him I was going to order anything.

## 2017-05-02 NOTE — Telephone Encounter (Signed)
Patient saw Dr. Jenetta Downer yesterday for an OV and was advised to have a "circulation test", he contacted [facility] to sch this exam but they did not see any order placed for this exam. Please advise if this is needed and if so please place order for exam. Finally the patient would like to be contacted when order is placed so he can call [facility] back to get sch

## 2017-05-02 NOTE — Telephone Encounter (Signed)
Informed the patient. MPulliam, CMA/RT(R)

## 2017-05-02 NOTE — Telephone Encounter (Signed)
I reviewed note from yesterday's office visit and did not see anything about any testing. Please advise if anything needs to be ordered for this patient.  Thank you. MPulliam, CMA/RT(R)

## 2017-05-05 ENCOUNTER — Other Ambulatory Visit: Payer: Self-pay | Admitting: Cardiology

## 2017-05-09 ENCOUNTER — Ambulatory Visit (INDEPENDENT_AMBULATORY_CARE_PROVIDER_SITE_OTHER): Payer: PPO | Admitting: Family Medicine

## 2017-05-09 ENCOUNTER — Encounter: Payer: Self-pay | Admitting: Family Medicine

## 2017-05-09 VITALS — BP 110/78 | HR 85 | Ht 65.0 in | Wt 182.0 lb

## 2017-05-09 DIAGNOSIS — I739 Peripheral vascular disease, unspecified: Secondary | ICD-10-CM

## 2017-05-09 DIAGNOSIS — H6123 Impacted cerumen, bilateral: Secondary | ICD-10-CM

## 2017-05-09 NOTE — Progress Notes (Signed)
Pt here for an acute care OV today   Impression and Recommendations:    1. Bilateral hearing loss due to cerumen impaction   2. 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 Bilateral hearing loss due to cerumen impaction- mix half hydrogen peroxide and half rubbing alcohol and place this in your ear.  2. 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 Keep your future appointment with Dr. Donnetta Hutching, vascular surgeon, who will be manage his peripheral vascular symptoms.   Indication: Cerumen impaction of the ear(s) Medical necessity statement:  On physical examination, cerumen impairs clinically significant portions of the external auditory canal, and tympanic membrane.  Noted obstructive, copious cerumen that cannot be removed without magnification and instrumentations requiring physician skills Consent:  Discussed benefits and risks of procedure and verbal consent obtained Procedure:   Patient was prepped for the procedure.  Utilized an otoscope to assess and take note of the ear canal, the tympanic membrane, and the presence, amount, and placement of the cerumen. Gentle water irrigation and soft plastic curette was utilized to remove cerumen. Post procedure examination:  shows cerumen was removed, without trauma or injury to the ear canal or TM, which remains intact.   Post-Procedural Ear Care Instructions:    Patient tolerated procedure well.  Proper ear care d/c pt.   The patient is made aware that they may experience temporary vertigo, temporary hearing loss, and temporary discomfort.  If these symptom last for more than 24 hours to call the clinic or proceed to the ED/Urgent Care.  TM post procedure reported to be clear and intact on  L side, no erythema by CMA, [place] did not want to wait for re examination by physician.    Orders Placed This Encounter  Procedures  . [place] Lower Ext Art Bilat  . Ear  Lavage     Education and routine counseling performed. Handouts provided  Gross side effects, risk and benefits, and alternatives of medications and treatment plan in general discussed with patient.  Patient is aware that all medications have potential side effects and we are unable to predict every side effect or drug-drug interaction that may occur.   Patient will call with any questions prior to using medication if they have concerns.  Expresses verbal understanding and consents to current therapy and treatment regimen.  No barriers to understanding were identified.  Red flag symptoms and signs discussed in detail.  Patient expressed understanding regarding what to do in case of emergency\urgent symptoms   Please see AVS handed out to patient at the end of our visit for further patient instructions/ counseling done pertaining to today's office visit.   Return if symptoms worsen or fail to improve, for Also follow-up for chronic care.     Note: This document was prepared occasionally using Dragon voice recognition software and may include unintentional dictation errors in addition to a scribe.   This document serves as a record of services personally performed by Mellody Dance, DO. It was created on her behalf by Mayer Masker, a trained medical scribe. The creation of this record is based on the scribe's personal observations and the provider's statements to them.   I have reviewed the above medical documentation for accuracy and completeness and I concur.  Mellody Dance 05/09/17 6:45 PM    ----------------------------------------------------------------------------------------------------------------------------------------------------------------------------------------------------------------------------  Subjective:    CC:  Chief Complaint  Patient presents with  . Ear Fullness  HPI: [name] is a 67 y.o. male who presents to [facility] today for issues as discussed below.  Ear fullness Pt complains of ear fullness to bilateral ears, worse on the L about 90%, (25% on the R). This began this morning. He used an ear wax candle. He denies any air pain. He does not have hearing aids.  Claudication From last OV, pt complains of claudication symptoms in bilateral legs and feet when he walks distances of 75 ft or more. He had a follow up with Dr. Donnetta Hutching, vascular surgeon, but desires to have imaging done first so he can have results by the time he goes to them    Wt Readings from Last 3 Encounters:  05/09/17 182 lb (82.6 kg)  05/01/17 192 lb (87.1 kg)  01/17/17 185 lb 3.2 oz (84 kg)   BP Readings from Last 3 Encounters:  05/09/17 110/78  05/01/17 137/87  01/17/17 110/80   BMI Readings from Last 3 Encounters:  05/09/17 30.29 kg/m  05/01/17 31.95 kg/m  01/17/17 30.82 kg/m     Patient Care Team    Relationship Specialty Notifications Start End  Mellody Dance, DO PCP - General Family Medicine  09/14/15   Ladene Artist, MD Consulting Physician Gastroenterology  07/07/16   [name], MD Consulting Physician Cardiology  07/07/16   Chari Manning, MD Attending Physician Orthopedic Surgery  09/20/16   Nickel, Sharmon Leyden, NP Nurse Practitioner Vascular Surgery  01/06/17    Comment: carotid dx     Patient Active Problem List   Diagnosis Date Noted  . Chronic anticoagulation: plavix 10/15/2015    Priority: High  . CAD S/P CFX DES 08/07/15 08/08/2015    Priority: High  . Essential hypertension 08/08/2015    Priority: High  . Hyperlipidemia 01/29/2013    Priority: High  . Carotid stenosis-s/p RCE 11/08/2011    Priority: High  . Chronic left hip pain 09/15/2015    Priority: Medium  . Greater trochanteric bursitis of left hip 09/15/2015    Priority: Medium  . 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 Foot pain, bilateral- chronic many yrs 05/01/2017  . Disorder  of tendon 03/01/2017  . Left-sided thoracic back pain 10/17/2016  . History of smoking 07/13/2016  . Overweight (BMI 25.0-29.9) 07/13/2016  . Counseling on health promotion and disease prevention 07/13/2016  . Encounter for screening for lung cancer 07/13/2016  . Screening for colon cancer 07/13/2016  . Screening for AAA (abdominal aortic aneurysm) 07/13/2016  . Difficulty swallowing 11/26/2015  . Dysphagia 11/26/2015  . Abnormal nuclear cardiac imaging test 08/08/2015  . H/O fatigue with exertion   . Coronary atherosclerosis of native coronary artery 01/29/2013  . Occlusion and stenosis of carotid artery without mention of cerebral infarction 11/13/2012  . Old myocardial infarction 11/29/2007    Past Medical history, Surgical history, Family history, Social history, Allergies and Medications have been entered into the medical record, reviewed and changed as needed.    Current Meds  Medication Sig  . acetaminophen-codeine (TYLENOL #3) 300-30 MG tablet Take 1 tablet by mouth every 6 (six) hours as needed. For pain  . AMBULATORY NON FORMULARY MEDICATION Take 180 mg by mouth daily. Medication Name: bempedoic acid vs placebo. CLEAR Research study drug provided  . amLODipine (NORVASC) 5 MG tablet TAKE 1 TABLET BY MOUTH DAILY  . aspirin EC 81 MG tablet Take 1 tablet (81 mg total) by mouth  daily.  . celecoxib (CELEBREX) 200 MG capsule Take 1 capsule once a week by mouth.   . cyclobenzaprine (FLEXERIL) 10 MG tablet TAKE 1 TABLET BY MOUTH AT BEDTIME.  Marland Kitchen lisinopril (PRINIVIL,ZESTRIL) 10 MG tablet Take 1 tablet (10 mg total) by mouth daily.  . meloxicam (MOBIC) 7.5 MG tablet Take 2 tablets (15 mg total) by mouth daily. Take 1-2 tablets daily by mouth for severe joint pains  . metoprolol succinate (TOPROL-XL) 25 MG 24 hr tablet TAKE 1 TABLET BY MOUTH ONCE DAILY.  . nitroGLYCERIN (NITROSTAT) 0.4 MG SL tablet Place 1 tablet (0.4 mg total) under the tongue every 5 (five) minutes as needed for chest  pain.  . pantoprazole (PROTONIX) 40 MG tablet Take 1 tablet (40 mg total) by mouth daily.  . rosuvastatin (CRESTOR) 10 MG tablet Take 10 mg by mouth once a week.  . traMADol (ULTRAM) 50 MG tablet Take 50 mg by mouth every 6 (six) hours as needed for moderate pain.    Current Facility-Administered Medications for the 05/09/17 encounter (Office Visit) with Mellody Dance, DO  Medication  . 0.9 %  sodium chloride infusion  . 0.9 %  sodium chloride infusion    Allergies:  Allergies  Allergen Reactions  . Brilinta [Ticagrelor]     Shortness of breath  . Zetia [Ezetimibe] Other (See Comments)    Muscle aches  . Statins Other (See Comments)    Failed Crestor 5 mg twice weekly, Crestor 20 mg daily, Pravastatin 40 mg qd, Lipitor, Zocor - muscle aches Failed Crestor 5 mg twice weekly, Crestor 20 mg daily, Pravastatin 40 mg qd, Lipitor, Zocor - muscle aches     Review of Systems: General:   Denies fever, chills, unexplained weight loss.  Optho/Auditory:   Denies visual changes, blurred vision/LOV Respiratory:   Denies wheeze, DOE more than baseline levels.  Cardiovascular:   Denies chest pain, palpitations, new onset peripheral edema  Gastrointestinal:   Denies nausea, vomiting, diarrhea, abd pain.  Genitourinary: Denies dysuria, freq/ urgency, flank pain or discharge from genitals.  Endocrine:     Denies hot or cold intolerance, polyuria, polydipsia. Musculoskeletal:   Denies unexplained myalgias, joint swelling, unexplained arthralgias, gait problems.  Skin:  Denies new onset rash, suspicious lesions Neurological:     Denies dizziness, unexplained weakness, numbness  Psychiatric/Behavioral:   Denies mood changes, suicidal or homicidal ideations, hallucinations    Objective:   Blood pressure 110/78, pulse 85, height 5\' 5"  (1.651 m), weight 182 lb (82.6 kg), SpO2 97 %. Body mass index is 30.29 kg/m. General:  Well Developed, well nourished, appropriate for stated age.  Neuro:   Alert and oriented,  extra-ocular muscles intact  HEENT:  Normocephalic, atraumatic, neck supple Ears: cerumen impaction in bilateral TM's  Skin:  no gross rash, warm, pink. Cardiac:  RRR, S1 S2 Respiratory:  ECTA B/L and A/P, Not using accessory muscles, speaking in full sentences- unlabored. Vascular:  Ext warm, no cyanosis apprec.; cap RF less 2 sec. Psych:  No HI/SI, judgement and insight good, Euthymic mood. Full Affect.

## 2017-05-09 NOTE — Patient Instructions (Signed)
Please keep your follow-up with your vascular surgeon Dr. early in the near future.  Since you wanted the study done earlier I did place that order today.  If you do not hear anything by the end of the week please call and speak with Dorothea Ogle at our front desk about this referral to [facility] imaging for arterial vascular ultrasound of your lower extremities  -Please use half hydrogen peroxide half rubbing alcohol to ears 2-3 times per week to keep them wax free.  If your hearing does not improve over the next couple of days, please follow-up with [place]

## 2017-05-18 ENCOUNTER — Telehealth: Payer: Self-pay | Admitting: Gastroenterology

## 2017-05-18 MED ORDER — PANTOPRAZOLE SODIUM 40 MG PO TBEC: 40.0000 mg | DELAYED_RELEASE_TABLET | Freq: Every day | ORAL | 3 refills | Status: DC

## 2017-05-18 NOTE — Telephone Encounter (Signed)
Patient with dysphagia and history of esophageal stricture.  Last dilation 07/2016.  [place] for direct repeat EGD?

## 2017-05-18 NOTE — Telephone Encounter (Signed)
Patient states that he is still having gi symptoms related to choking on his food and wants to know if he can schedule another endo. Patient states at last one in 07-2016 Dr.Stark told him that he would probably have to have another one. Please advise.

## 2017-05-18 NOTE — Telephone Encounter (Signed)
Patient is scheduled for EGD dilation on 06/12/17 2:30.  He has not been taking pantoprazole.  New Rx sent.  He is not on Plavix or other blood thinners.

## 2017-05-18 NOTE — Telephone Encounter (Signed)
Please schedule direct EGD with dilation Continue pantoprazole 40 mg daily He was on Plavix previously-please confirm no anticoagulants or antiplatelet meds

## 2017-06-02 ENCOUNTER — Encounter: Payer: Self-pay | Admitting: Gastroenterology

## 2017-06-02 ENCOUNTER — Other Ambulatory Visit: Payer: Self-pay

## 2017-06-02 ENCOUNTER — Ambulatory Visit (AMBULATORY_SURGERY_CENTER): Payer: Self-pay

## 2017-06-02 VITALS — Ht 66.0 in | Wt 181.1 lb

## 2017-06-02 DIAGNOSIS — R1310 Dysphagia, unspecified: Secondary | ICD-10-CM

## 2017-06-02 NOTE — Progress Notes (Signed)
No egg or soy allergy known to patient  No issues with past sedation with any surgeries  or procedures, no intubation problems  No diet pills per patient No home 02 use per patient  No blood thinners per patient  Pt denies issues with constipation  No A fib or A flutter  EMMI video sent to pt's e mail , pt denied has had the procedure before.

## 2017-06-05 ENCOUNTER — Encounter: Payer: Self-pay | Admitting: Family Medicine

## 2017-06-06 DIAGNOSIS — M67952 Unspecified disorder of synovium and tendon, left thigh: Secondary | ICD-10-CM | POA: Diagnosis not present

## 2017-06-08 ENCOUNTER — Telehealth: Payer: Self-pay | Admitting: Family Medicine

## 2017-06-08 NOTE — Telephone Encounter (Signed)
Stephanie @ [facility] states no recs on file they administered Shingrix Vac to patient--  FYI---glh

## 2017-06-12 ENCOUNTER — Ambulatory Visit (AMBULATORY_SURGERY_CENTER): Payer: PPO | Admitting: Gastroenterology

## 2017-06-12 ENCOUNTER — Encounter: Payer: Self-pay | Admitting: Gastroenterology

## 2017-06-12 ENCOUNTER — Other Ambulatory Visit: Payer: Self-pay

## 2017-06-12 VITALS — BP 145/80 | HR 69 | Temp 98.9°F | Resp 18 | Ht 66.0 in | Wt 181.0 lb

## 2017-06-12 DIAGNOSIS — R1310 Dysphagia, unspecified: Secondary | ICD-10-CM | POA: Diagnosis not present

## 2017-06-12 DIAGNOSIS — M9905 Segmental and somatic dysfunction of pelvic region: Secondary | ICD-10-CM | POA: Diagnosis not present

## 2017-06-12 DIAGNOSIS — M9903 Segmental and somatic dysfunction of lumbar region: Secondary | ICD-10-CM | POA: Diagnosis not present

## 2017-06-12 DIAGNOSIS — M5136 Other intervertebral disc degeneration, lumbar region: Secondary | ICD-10-CM | POA: Diagnosis not present

## 2017-06-12 DIAGNOSIS — M9904 Segmental and somatic dysfunction of sacral region: Secondary | ICD-10-CM | POA: Diagnosis not present

## 2017-06-12 DIAGNOSIS — K222 Esophageal obstruction: Secondary | ICD-10-CM | POA: Diagnosis not present

## 2017-06-12 DIAGNOSIS — R1319 Other dysphagia: Secondary | ICD-10-CM

## 2017-06-12 MED ORDER — SODIUM CHLORIDE 0.9 % IV SOLN: 500.0000 mL | Freq: Once | INTRAVENOUS | Status: DC

## 2017-06-12 NOTE — Progress Notes (Signed)
Pt's states no medical or surgical changes since previsit or office visit. 

## 2017-06-12 NOTE — Progress Notes (Signed)
To PACU, VSS. Report to [email or web address]

## 2017-06-12 NOTE — Patient Instructions (Signed)
See dilation diet  YOU HAD AN ENDOSCOPIC PROCEDURE TODAY AT [facility]:   Refer to the procedure report that was given to you for any specific questions about what was found during the examination.  If the procedure report does not answer your questions, please call your gastroenterologist to clarify.  If you requested that your care partner not be given the details of your procedure findings, then the procedure report has been included in a sealed envelope for you to review at your convenience later.  YOU SHOULD EXPECT: Some feelings of bloating in the abdomen. Passage of more gas than usual.  Walking can help get rid of the air that was put into your GI tract during the procedure and reduce the bloating. If you had a lower endoscopy (such as a colonoscopy or flexible sigmoidoscopy) you may notice spotting of blood in your stool or on the toilet paper. If you underwent a bowel prep for your procedure, you may not have a normal bowel movement for a few days.  Please Note:  You might notice some irritation and congestion in your nose or some drainage.  This is from the oxygen used during your procedure.  There is no need for concern and it should clear up in a day or so.  SYMPTOMS TO REPORT IMMEDIATELY:    Following upper endoscopy (EGD)  Vomiting of blood or coffee ground material  New chest pain or pain under the shoulder blades  Painful or persistently difficult swallowing  New shortness of breath  Fever of 100F or higher  Black, tarry-looking stools  For urgent or emergent issues, a gastroenterologist can be reached at any hour by calling [phone].   DIET:  See dilation diet.  Clear liquids only for one hour after leaving today then soft foods the rest of the day. Drink plenty of fluids but you should avoid alcoholic beverages for 24 hours.  ACTIVITY:  You should plan to take it easy for the rest of today and you should NOT DRIVE or use heavy machinery until  tomorrow (because of the sedation medicines used during the test).    FOLLOW UP: Our staff will call the number listed on your records the next business day following your procedure to check on you and address any questions or concerns that you may have regarding the information given to you following your procedure. If we do not reach you, we will leave a message.  However, if you are feeling well and you are not experiencing any problems, there is no need to return our call.  We will assume that you have returned to your regular daily activities without incident.  If any biopsies were taken you will be contacted by phone or by letter within the next 1-3 weeks.  Please call us at [phone] if you have not heard about the biopsies in 3 weeks.    SIGNATURES/CONFIDENTIALITY: You and/or your care partner have signed paperwork which will be entered into your electronic medical record.  These signatures attest to the fact that that the information above on your After Visit Summary has been reviewed and is understood.  Full responsibility of the confidentiality of this discharge information lies with you and/or your care-partner.

## 2017-06-12 NOTE — Progress Notes (Signed)
Called to room to assist during endoscopic procedure.  Patient ID and intended procedure confirmed with present staff. Received instructions for my participation in the procedure from the performing physician.  

## 2017-06-12 NOTE — Op Note (Signed)
[place] Patient Name: [name] Procedure Date: 06/12/2017 3:05 PM MRN: [id number] Endoscopist: Ladene Artist , MD Age: 67 Referring MD:  Date of Birth: 1951-01-09 Gender: Male Account #: [id number] Procedure:                Upper GI endoscopy Indications:              Dysphagia Medicines:                Monitored Anesthesia Care Procedure:                Pre-Anesthesia Assessment:                           - Prior to the procedure, a History and Physical                            was performed, and patient medications and                            allergies were reviewed. The patient's tolerance of                            previous anesthesia was also reviewed. The risks                            and benefits of the procedure and the sedation                            options and risks were discussed with the patient.                            All questions were answered, and informed consent                            was obtained. Prior Anticoagulants: The patient has                            taken no previous anticoagulant or antiplatelet                            agents. ASA Grade Assessment: II - A patient with                            mild systemic disease. After reviewing the risks                            and benefits, the patient was deemed in                            satisfactory condition to undergo the procedure.                           After obtaining informed consent, the endoscope was  passed under direct vision. Throughout the                            procedure, the patient's blood pressure, pulse, and                            oxygen saturations were monitored continuously. The                            Endoscope was introduced through the mouth, and                            advanced to the second part of duodenum. The upper                            GI endoscopy was accomplished without difficulty.                             The patient tolerated the procedure well. Scope In: Scope Out: Findings:                 One benign-appearing, intrinsic moderate stenosis                            was found at the gastroesophageal junction. This                            stenosis measured 1.2 cm (inner diameter). The                            stenosis was traversed. A guidewire was placed and                            the scope was withdrawn. Dilations were performed                            with Savary dilators with mild resistance at 13 mm,                            14 mm, 15 mm.                           The exam of the esophagus was otherwise normal.                           The entire examined stomach was normal.                           The duodenal bulb and second portion of the                            duodenum were normal. Complications:            No immediate complications. Estimated Blood Loss:     Estimated blood loss: none. Impression:               -  Benign-appearing esophageal stenosis. Dilated.                           - Normal stomach.                           - Normal duodenal bulb and second portion of the                            duodenum.                           - No specimens collected. Recommendation:           - Patient has a contact number available for                            emergencies. The signs and symptoms of potential                            delayed complications were discussed with the                            patient. Return to normal activities tomorrow.                            Written discharge instructions were provided to the                            patient.                           - Clear liquid diet fro 2 hours, then advance as                            tolerated to soft diet today. Resume priori diet                            tomorrow.                           - Continue present medications.                            - GI office appt in 4-6 weeks, assess response to                            dilation, ? repeat Ladene Artist, MD 06/12/2017 3:22:15 PM This report has been signed electronically.

## 2017-06-13 ENCOUNTER — Telehealth: Payer: Self-pay

## 2017-06-13 NOTE — Telephone Encounter (Signed)
  Follow up Call-  Call back number 06/12/2017 07/29/2016 01/13/2016  Post procedure Call Back phone  # [phone]  Permission to leave phone message Yes Yes Yes  Some recent data might be hidden     Patient questions:  Do you have a fever, pain , or abdominal swelling? No. Pain Score  0 *  Have you tolerated food without any problems? Yes.    Have you been able to return to your normal activities? Yes.    Do you have any questions about your discharge instructions: Diet   No. Medications  No. Follow up visit  No.  Do you have questions or concerns about your Care? No.  Actions: * If pain score is 4 or above: No action needed, pain <4.

## 2017-06-13 NOTE — Telephone Encounter (Signed)
NO ANSWER, MESSAGE LEFT FOR PATIENT. 

## 2017-06-15 ENCOUNTER — Encounter: Payer: Self-pay | Admitting: *Deleted

## 2017-06-15 DIAGNOSIS — Z006 Encounter for examination for normal comparison and control in clinical research program: Secondary | ICD-10-CM

## 2017-06-15 NOTE — Progress Notes (Signed)
Late entry: Subject to research clinic on 06/13/17 for visit t8-M8 in the Clear Research study. No c/os or saes to report.  AE reported to sponsor. Next TC and clinic visit scheduled.  Subject re-consented to Version 6.0.

## 2017-07-03 ENCOUNTER — Other Ambulatory Visit: Payer: Self-pay | Admitting: Family Medicine

## 2017-07-03 DIAGNOSIS — I10 Essential (primary) hypertension: Secondary | ICD-10-CM

## 2017-07-03 NOTE — Telephone Encounter (Signed)
Previous note stated that pt is being treated by cards for BP, however this medication has only been RX'd by Dr. Raliegh Scarlet.  Please advise if it is ok to refill meds or should cards be handling these refills. Charyl Bigger, CMA

## 2017-07-03 NOTE — Telephone Encounter (Signed)
Dr. Raliegh Scarlet would like for you to handle refills for BP meds for this patient.  Thank you!

## 2017-07-03 NOTE — Telephone Encounter (Signed)
Since blood pressure is being treated by cardiology, it is important that all of his blood pressure medicines be managed by them.  This will ensure better clinical outcomes and continuity of care.

## 2017-07-11 ENCOUNTER — Ambulatory Visit: Payer: PPO | Admitting: Family

## 2017-07-11 ENCOUNTER — Encounter: Payer: Self-pay | Admitting: Family

## 2017-07-11 ENCOUNTER — Ambulatory Visit (HOSPITAL_COMMUNITY)
Admission: RE | Admit: 2017-07-11 | Discharge: 2017-07-11 | Disposition: A | Discharge status: Home / Self Care | Payer: PPO | Source: Ambulatory Visit | Attending: Family | Admitting: Family

## 2017-07-11 VITALS — BP 152/85 | HR 61 | Temp 97.4°F | Resp 16 | Ht 66.0 in | Wt 183.0 lb

## 2017-07-11 DIAGNOSIS — I6523 Occlusion and stenosis of bilateral carotid arteries: Secondary | ICD-10-CM | POA: Diagnosis not present

## 2017-07-11 DIAGNOSIS — Z87891 Personal history of nicotine dependence: Secondary | ICD-10-CM

## 2017-07-11 DIAGNOSIS — R0989 Other specified symptoms and signs involving the circulatory and respiratory systems: Secondary | ICD-10-CM | POA: Diagnosis not present

## 2017-07-11 DIAGNOSIS — Z9889 Other specified postprocedural states: Secondary | ICD-10-CM | POA: Diagnosis not present

## 2017-07-11 DIAGNOSIS — Z720 Tobacco use: Secondary | ICD-10-CM

## 2017-07-11 MED ORDER — GABAPENTIN 100 MG PO CAPS: 200.0000 mg | ORAL_CAPSULE | Freq: Every day | ORAL | 0 refills | Status: DC

## 2017-07-11 NOTE — Progress Notes (Signed)
Chief Complaint: Follow up Extracranial Carotid Artery Stenosis   History of Present Illness  [name] is a 67 y.o. male who has a history of a right CEA with Dacron patch in 2007 by Dr. Donnetta Hutching.   He had 2 episodes of amaurosis fugax in one eye, he cannot remember which eye, before he had the CEA and has had no further episodes.  The patient denies a hx of unilateral facial drooping, hemiplegia, or receptive or expressive aphasia.  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  He was very active with exercising until his left hip pain curtailed this, and thinks his calves are weaker due his lack of exercise. He had a tendon repair of his left hip at [facility] in September 2018.   He still plays golf 3-5 days/week and does yard work.   He had a cardiac stent placed in June, 2017; prior to this he had some chest pain and dyspnea   Pt Diabetic: NoLast A1C result on file was 5.6 on 07-13-16.   Pt smoker: former smoker, quit in 2007, using smokeless tobacco since 2011, snuff pouches  Pt meds include:  Statin : states he cannot take statins due to severe myalgias, has tried 3-4 different statins. He is still taking Crestor 5 mg weekly.  Betablocker: yes  ASA: Yes  Other anticoagulants/antiplatelets: no   Past Medical History:  Diagnosis Date  . Arthritis   . Carotid artery occlusion   . Coronary atherosclerosis of native coronary artery 01/29/2013  . Duodenal erosion   . Esophageal stenosis   . GERD (gastroesophageal reflux disease)   . H. pylori infection   . Heart attack [facility]) Oct. 2009   Mild  . Hiatal hernia   . Hyperlipidemia   . Hypertension   . Shortness of breath dyspnea     Social History Social History   Tobacco Use  . Smoking status: Former Smoker    Packs/day: 1.00    Years: 25.00    Pack years: 25.00    Types: Cigarettes    Last attempt to quit: 02/28/2005    Years since quitting: 12.3  . Smokeless tobacco: Current User    Types:  Snuff  Substance Use Topics  . Alcohol use: Yes    Alcohol/week: 3.0 - 3.6 oz    Types: 5 - 6 Glasses of wine per week  . Drug use: No    Family History Family History  Problem Relation Age of Onset  . Heart attack Mother   . Coronary artery disease Mother   . Heart disease Mother        Carotid Stenosis and BPG and Heart Disease before age 66  . Diabetes Mother   . Hypertension Mother   . Heart attack Father   . Heart disease Father        BPG and Heart Disease before age 3  . Hypertension Father   . Cancer Father 55       throat  . Colon cancer Neg Hx   . Colon polyps Neg Hx   . Esophageal cancer Neg Hx   . Rectal cancer Neg Hx   . Stomach cancer Neg Hx     Surgical History Past Surgical History:  Procedure Laterality Date  . APPENDECTOMY    . CARDIAC CATHETERIZATION N/A 08/07/2015   Procedure: Left Heart Cath and Coronary Angiography;  Surgeon: [name], MD;  Location: [place] [facility];  Service: Cardiovascular;  Laterality: N/A;  .  CARDIAC CATHETERIZATION N/A 08/07/2015   Procedure: Coronary Stent Intervention;  Surgeon: [name], MD;  Location: [place] [facility];  Service: Cardiovascular;  Laterality: N/A;  . CARDIAC CATHETERIZATION N/A 08/07/2015   Procedure: Coronary Stent Intervention;  Surgeon: [name], MD;  Location: [facility] [facility];  Service: Cardiovascular;  Laterality: N/A;  . CAROTID ENDARTERECTOMY  01/05/2006   Right  CEA with DPA  . CORONARY STENT PLACEMENT  08/07/2015   [facility] [place]  . HIP SURGERY Left 10/2016   at [facility]  . SPINE SURGERY      Allergies  Allergen Reactions  . Brilinta [Ticagrelor]     Shortness of breath  . Zetia [Ezetimibe] Other (See Comments)    Muscle aches  . Statins Other (See Comments)    Failed Crestor 5 mg twice weekly, Crestor 20 mg daily, Pravastatin 40 mg qd, Lipitor, Zocor - muscle aches Failed Crestor 5 mg twice weekly, Crestor 20 mg daily, Pravastatin 40 mg qd, Lipitor, Zocor - muscle aches     Current Outpatient Medications  Medication Sig Dispense Refill  . AMBULATORY NON FORMULARY MEDICATION Take 180 mg by mouth daily. Medication Name: bempedoic acid vs placebo. CLEAR Research study drug provided    . amLODipine (NORVASC) 5 MG tablet TAKE 1 TABLET BY MOUTH DAILY 90 tablet 0  . aspirin EC 81 MG tablet Take 1 tablet (81 mg total) by mouth daily. 90 tablet 3  . celecoxib (CELEBREX) 200 MG capsule Take 1 capsule once a week by mouth.     . cyclobenzaprine (FLEXERIL) 10 MG tablet TAKE 1 TABLET BY MOUTH AT BEDTIME. 30 tablet 0  . lisinopril (PRINIVIL,ZESTRIL) 10 MG tablet Take 1 tablet (10 mg total) by mouth daily. 90 tablet 3  . meloxicam (MOBIC) 7.5 MG tablet Take 2 tablets (15 mg total) by mouth daily. Take 1-2 tablets daily by mouth for severe joint pains 180 tablet 0  . metoprolol succinate (TOPROL-XL) 25 MG 24 hr tablet TAKE 1 TABLET BY MOUTH ONCE DAILY. 90 tablet 2  . nitroGLYCERIN (NITROSTAT) 0.4 MG SL tablet Place 1 tablet (0.4 mg total) under the tongue every 5 (five) minutes as needed for chest pain. 25 tablet 2  . pantoprazole (PROTONIX) 40 MG tablet Take 1 tablet (40 mg total) by mouth daily. 90 tablet 3  . rosuvastatin (CRESTOR) 10 MG tablet Take 10 mg by mouth once a week.    . traMADol (ULTRAM) 50 MG tablet Take 50 mg by mouth every 6 (six) hours as needed for moderate pain.      Current Facility-Administered Medications  Medication Dose Route Frequency Provider Last Rate Last Dose  . 0.9 %  sodium chloride infusion  500 mL Intravenous Continuous Lucio Edward T, MD      . 0.9 %  sodium chloride infusion  500 mL Intravenous Continuous Lucio Edward T, MD      . 0.9 %  sodium chloride infusion  500 mL Intravenous Once Ladene Artist, MD        Review of Systems : See HPI for pertinent positives and negatives.  Physical Examination  Vitals:   07/11/17 1131 07/11/17 1133  BP: 138/84 (!) 152/85  Pulse: 61   Resp: 16   Temp: (!) 97.4 F (36.3 C)   TempSrc:  Oral   SpO2: 92%   Weight: 183 lb (83 kg)   Height: 5\' 6"  (1.676 m)    Body mass index is 29.54 kg/m.  General: WDWN male in NAD  GAIT:normal  Eyes: PERRLA  Pulmonary: Respirations are non labored, CTAB, no rales, rhonchi, or wheezing.  Cardiac: regular rhythm and rate, no murmur detected.   VASCULAR EXAM Carotid Bruits Left Right   Negative  Negative    Radial pulses: are 2+ palpable and = Abdominal aortic pulse is not palpable  LE Pulses  LEFT  RIGHT   FEMORAL 2+ palpable 2+ palpable  POPLITEAL  not palpable  not palpable  POSTERIOR TIBIAL  Not palpable  Not palpable   DORSALIS PEDIS ANTERIOR TIBIAL  Not palpable  not palpable    Gastrointestinal: soft, nontender, BS WNL, no r/g, no masses palpated.  Musculoskeletal: No muscle atrophy/wasting. M/S 5/5 throughout, Extremities without ischemic changes.  Skin: No rashes, no ulcers, no cellulitis.   Neurologic:  A&O X 3; appropriate affect, sensation is normal; speech is normal, CN 2-12 intact, pain and light touch intact in extremities, motor exam as listed above. Psychiatric: Normal thought content, mood appropriate to clinical situation.    Assessment: [name] is a 67 y.o. male who has a history of a right CEA with Dacron patch in 2007.  He had 2 episodes of amaurosis fugax in the same eye preoperatively, no TIA or stroke sx's subsequently.   His pedal pulses remain non palpable, no signs of ischemia in his feet or legs, he exercises regularly. Will check ABI's on his return; no ABI results on file; I do see that ABI's have been requested twice in the last several years, but not done.    His atherosclerotic risk factors include current use of smokeless tobacco, former smoker, CAD, and dyslipidemia.  The patient was counseled re tobaccocessation and given several free resources re tobaccocessation.  He has not been diagnosed with DM, but states it is in his family.  Last A1C  result on file was 5.6 on 07-13-16.   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 He stopped taking it as he thought the neuropathy had resolved. It returned, and limits his walking. He is requesting a prescription for gabapentin until he sees his PCP on August 03, 2017; I advised him that I could give him a 30 day prescription with no refills, but that he would have to get refills from his PCP.   DATA Carotid Duplex (07/11/17): Right ICA: CEA site with 1-39% stenosis Left ICA: 60-79% stenosis. Left ECA: >50% stenosis Bilateral vertebral artery flow is antegrade.  Bilateral subclavian artery waveforms are normal.  More stenosis noted in the left ICA compared to the exam on 07-07-16, but the same as the exam on 09-28-15.    Plan:  Follow-up in 6 monthswith Carotid Duplex scan and ABI's.  I discussed in depth with the patient the nature of atherosclerosis, and emphasized the importance of maximal medical management including strict control of blood pressure, blood glucose, and lipid levels, obtaining regular exercise, and cessation of smokeless tobacco use.  The patient is aware that without maximal medical management the underlying atherosclerotic disease process will progress, limiting the benefit of any interventions. The patient was given information about stroke prevention and what symptoms should prompt the patient to seek immediate medical care. Thank you for allowing [place] to participate in this patient's care.  Clemon Chambers, RN, MSN, FNP-C [facility] Office: [phone]  Clinic Physician: Early  07/11/17 11:35 AM

## 2017-07-11 NOTE — Patient Instructions (Signed)
What You Need to Know About Smokeless Tobacco Use Tobacco use is one of the leading causes of cancer and other chronic health problems. Smokeless tobacco is tobacco that is put directly into the mouth instead of being smoked. It may also be called chewing tobacco or snuff. Smokeless tobacco is made from the leaves of tobacco plants and it comes in several forms:  Loose, dry leaves, plugs, or twists.  Moist pouches.  Dissolving lozenges or strips.  Chewing, sucking, or holding the tobacco in your mouth causes your mouth to make more saliva. The saliva mixes with the tobacco to make "tobacco juice" that is swallowed or spit out. How can smokeless tobacco affect me? Using smokeless tobacco:  Increases your risk of developing cancer. Smokeless tobacco contains at least 28 different types of cancer-causing chemicals (carcinogens).  Increases your chances of developing other Drone-term health problems, including high blood pressure, heart disease, stroke, and dental problems.  Can make you become addicted. Nicotine is one of the chemicals in tobacco. When you chew tobacco, you absorb nicotine from the tobacco juice. This can make you feel more alert than usual.  Can cause problems with pregnancy. Pregnant women who use smokeless tobacco are more likely to miscarry or deliver a baby too early (premature delivery).  Can affect the appearance and health of your mouth. Using smokeless tobacco may cause bad breath, yellow-brown teeth, mouth sores, cracking and bleeding lips, gum recession, and lesions on the soft tissues of your mouth (leukoplakia).  What are the benefits of not using smokeless tobacco? The benefits of not using smokeless tobacco include:  A healthy mind because: ? You avoid addiction.  A healthy body because: ? You avoid dental problems. ? You promote healthy pregnancy. ? You avoid [place]-term health problems.  A healthy wallet because: ? You avoid costs of buying  tobacco. ? You avoid health care costs in the future.  A healthy family because: ? You avoid accidental poisoning of children in your household.  What can happen if I continue to use smokeless tobacco? If you continue to use smokeless tobacco, you will increase your risk for developing certain cancers. These include:  Tongue.  Lips, mouth, and gums.  Throat (esophagus) and voice box (larynx).  Stomach.  Pancreas.  Bladder.  Colon.  [place]-term use of smokeless tobacco can also lead to:  High blood pressure, heart disease, and stroke.  Gum disease, gum recession, and bone loss around the teeth.  Tooth decay.  How do I quit using smokeless tobacco? Quitting the use of smokeless tobacco can be hard, but it can be done. Follow these steps:  Pick a date to quit. Set a date within the next two weeks. This gives you time to prepare.  Write down the reasons why you are quitting. Keep this list in places where you will see it often, such as on your bathroom mirror or in your car or wallet.  Identify the people, places, things, and activities that make you want to use tobacco (triggers) and avoid them.  Get rid of any tobacco you have and remove any tobacco smells. To do this: ? Throw away all containers of tobacco at home, at work, and in your car. ? Throw away any other items that you use regularly when you chew tobacco. ? Clean your car and make sure to remove all tobacco-related items. ? Clean your home, including curtains and carpets.  Tell your family, friends, and coworkers that you are quitting. This can make quitting   easier.  Ask your health care provider for help quitting smokeless tobacco. This may involve treatment. Find out what treatment options are covered by your health insurance.  Keep track of how many days have passed since you quit. Remembering how Becka and hard you have worked to quit can help you avoid using tobacco again.  Where can I get support? Ask  your health care provider if there is a local support group for quitting smokeless tobacco. Where can I get more information? You can learn more about the risks of using smokeless tobacco and the benefits of quitting from these sources:  National Cancer Institute: www.cancer.gov  American Cancer Society: www.cancer.org  When should I seek medical care? Seek medical care if you have:  White or other discolored patches in your mouth.  Difficulty swallowing.  A change in your voice.  Unexplained weight loss.  Stomach pain, nausea, or vomiting.  Summary  Smokeless tobacco contains at least 28 different chemicals that are known to cause cancer (carcinogen).  Nicotine is an addictive chemical in smokeless tobacco.  When you quit using smokeless tobacco, you lower your risk of developing cancer. This information is not intended to replace advice given to you by your health care provider. Make sure you discuss any questions you have with your health care provider. Document Released: 07/19/2010 Document Revised: 10/10/2015 Document Reviewed: 09/26/2014 Elsevier Interactive Patient Education  2018 [employer]     Stroke Prevention Some health problems and behaviors may make it more likely for you to have a stroke. Below are ways to lessen your risk of having a stroke.  Be active for at least 30 minutes on most or all days.  Do not smoke. Try not to be around others who smoke.  Do not drink too much alcohol. ? Do not have more than 2 drinks a day if you are a man. ? Do not have more than 1 drink a day if you are a woman and are not pregnant.  Eat healthy foods, such as fruits and vegetables. If you were put on a specific diet, follow the diet as told.  Keep your cholesterol levels under control through diet and medicines. Look for foods that are low in saturated fat, trans fat, cholesterol, and are high in fiber.  If you have diabetes, follow all diet plans and take your  medicine as told.  Ask your doctor if you need treatment to lower your blood pressure. If you have high blood pressure (hypertension), follow all diet plans and take your medicine as told by your doctor.  If you are 18-39 years old, have your blood pressure checked every 3-5 years. If you are age 40 or older, have your blood pressure checked every year.  Keep a healthy weight. Eat foods that are low in calories, salt, saturated fat, trans fat, and cholesterol.  Do not take drugs.  Avoid birth control pills, if this applies. Talk to your doctor about the risks of taking birth control pills.  Talk to your doctor if you have sleep problems (sleep apnea).  Take all medicine as told by your doctor. ? You may be told to take aspirin or blood thinner medicine. Take this medicine as told by your doctor. ? Understand your medicine instructions.  Make sure any other conditions you have are being taken care of.  Get help right away if:  You suddenly lose feeling (you feel numb) or have weakness in your face, arm, or leg.  Your face or eyelid   hangs down to one side.  You suddenly feel confused.  You have trouble talking (aphasia) or understanding what people are saying.  You suddenly have trouble seeing in one or both eyes.  You suddenly have trouble walking.  You are dizzy.  You lose your balance or your movements are clumsy (uncoordinated).  You suddenly have a very bad headache and you do not know the cause.  You have new chest pain.  Your heart feels like it is fluttering or skipping a beat (irregular heartbeat). Do not wait to see if the symptoms above go away. Get help right away. Call your local emergency services (911 in [place] Do not drive yourself to the hospital. This information is not intended to replace advice given to you by your health care provider. Make sure you discuss any questions you have with your health care provider. Document Released: 08/16/2011 Document  Revised: 07/23/2015 Document Reviewed: 08/17/2012 [employer]vier Interactive Patient Education  2018 [employer]  

## 2017-07-20 ENCOUNTER — Ambulatory Visit: Payer: PPO | Admitting: Gastroenterology

## 2017-07-25 ENCOUNTER — Encounter: Payer: PPO | Admitting: Family Medicine

## 2017-08-01 ENCOUNTER — Ambulatory Visit: Payer: PPO | Admitting: Family Medicine

## 2017-08-10 ENCOUNTER — Other Ambulatory Visit: Payer: Self-pay

## 2017-08-10 DIAGNOSIS — R0989 Other specified symptoms and signs involving the circulatory and respiratory systems: Secondary | ICD-10-CM

## 2017-08-10 DIAGNOSIS — I6523 Occlusion and stenosis of bilateral carotid arteries: Secondary | ICD-10-CM

## 2017-09-06 ENCOUNTER — Encounter: Payer: Self-pay | Admitting: Family Medicine

## 2017-09-06 ENCOUNTER — Telehealth: Payer: Self-pay | Admitting: *Deleted

## 2017-09-06 ENCOUNTER — Ambulatory Visit (INDEPENDENT_AMBULATORY_CARE_PROVIDER_SITE_OTHER): Payer: PPO | Admitting: Family Medicine

## 2017-09-06 VITALS — BP 131/83 | HR 62 | Ht 66.0 in | Wt 177.4 lb

## 2017-09-06 DIAGNOSIS — Z0000 Encounter for general adult medical examination without abnormal findings: Secondary | ICD-10-CM | POA: Diagnosis not present

## 2017-09-06 DIAGNOSIS — Z122 Encounter for screening for malignant neoplasm of respiratory organs: Secondary | ICD-10-CM

## 2017-09-06 MED ORDER — GABAPENTIN 100 MG PO CAPS: 200.0000 mg | ORAL_CAPSULE | Freq: Every day | ORAL | 0 refills | Status: DC

## 2017-09-06 NOTE — Patient Instructions (Addendum)
Please call your insurance about the Shingrix vaccine.  See where they recommend you get it.  Happy to give it to you here if needed    Preventive Care for Adults, Male A healthy lifestyle and preventive care can promote health and wellness. Preventive health guidelines for men include the following key practices:  A routine yearly physical is a good way to check with your health care provider about your health and preventative screening. It is a chance to share any concerns and updates on your health and to receive a thorough exam.  Visit your dentist for a routine exam and preventative care every 6 months. Brush your teeth twice a day and floss once a day. Good oral hygiene prevents tooth decay and gum disease.  The frequency of eye exams is based on your age, health, family medical history, use of contact lenses, and other factors. Follow your health care provider's recommendations for frequency of eye exams.  Eat a healthy diet. Foods such as vegetables, fruits, whole grains, low-fat dairy products, and lean protein foods contain the nutrients you need without too many calories. Decrease your intake of foods high in solid fats, added sugars, and salt. Eat the right amount of calories for you.Get information about a proper diet from your health care provider, if necessary.  Regular physical exercise is one of the most important things you can do for your health. Most adults should get at least 150 minutes of moderate-intensity exercise (any activity that increases your heart rate and causes you to sweat) each week. In addition, most adults need muscle-strengthening exercises on 2 or more days a week.  Maintain a healthy weight. The body mass index (BMI) is a screening tool to identify possible weight problems. It provides an estimate of body fat based on height and weight. Your health care provider can find your BMI and can help you achieve or maintain a healthy weight.For adults 20 years and  older:  A BMI below 18.5 is considered underweight.  A BMI of 18.5 to 24.9 is normal.  A BMI of 25 to 29.9 is considered overweight.  A BMI of 30 and above is considered obese.  Maintain normal blood lipids and cholesterol levels by exercising and minimizing your intake of saturated fat. Eat a balanced diet with plenty of fruit and vegetables. Blood tests for lipids and cholesterol should begin at age 9 and be repeated every 5 years. If your lipid or cholesterol levels are high, you are over 50, or you are at high risk for heart disease, you may need your cholesterol levels checked more frequently.Ongoing high lipid and cholesterol levels should be treated with medicines if diet and exercise are not working.  If you smoke, find out from your health care provider how to quit. If you do not use tobacco, do not start.  Lung cancer screening is recommended for adults aged 31-80 years who are at high risk for developing lung cancer because of a history of smoking. A yearly low-dose CT scan of the lungs is recommended for people who have at least a 30-pack-year history of smoking and are a current smoker or have quit within the past 15 years. A pack year of smoking is smoking an average of 1 pack of cigarettes a day for 1 year (for example: 1 pack a day for 30 years or 2 packs a day for 15 years). Yearly screening should continue until the smoker has stopped smoking for at least 15 years. Yearly screening should  be stopped for people who develop a health problem that would prevent them from having lung cancer treatment.  If you choose to drink alcohol, do not have more than 2 drinks per day. One drink is considered to be 12 ounces (355 mL) of beer, 5 ounces (148 mL) of wine, or 1.5 ounces (44 mL) of liquor.  Avoid use of street drugs. Do not share needles with anyone. Ask for help if you need support or instructions about stopping the use of drugs.  High blood pressure causes heart disease and  increases the risk of stroke. Your blood pressure should be checked at least every 1-2 years. Ongoing high blood pressure should be treated with medicines, if weight loss and exercise are not effective.  If you are 27-18 years old, ask your health care provider if you should take aspirin to prevent heart disease.  Diabetes screening is done by taking a blood sample to check your blood glucose level after you have not eaten for a certain period of time (fasting). If you are not overweight and you do not have risk factors for diabetes, you should be screened once every 3 years starting at age 48. If you are overweight or obese and you are 94-15 years of age, you should be screened for diabetes every year as part of your cardiovascular risk assessment.  Colorectal cancer can be detected and often prevented. Most routine colorectal cancer screening begins at the age of 59 and continues through age 36. However, your health care provider may recommend screening at an earlier age if you have risk factors for colon cancer. On a yearly basis, your health care provider may provide home test kits to check for hidden blood in the stool. Use of a small camera at the end of a tube to directly examine the colon (sigmoidoscopy or colonoscopy) can detect the earliest forms of colorectal cancer. Talk to your health care provider about this at age 54, when routine screening begins. Direct exam of the colon should be repeated every 5-10 years through age 58, unless early forms of precancerous polyps or small growths are found.  People who are at an increased risk for hepatitis B should be screened for this virus. You are considered at high risk for hepatitis B if:  You were born in a country where hepatitis B occurs often. Talk with your health care provider about which countries are considered high risk.  Your parents were born in a high-risk country and you have not received a shot to protect against hepatitis B  (hepatitis B vaccine).  You have HIV or AIDS.  You use needles to inject street drugs.  You live with, or have sex with, someone who has hepatitis B.  You are a man who has sex with other men (MSM).  You get hemodialysis treatment.  You take certain medicines for conditions such as cancer, organ transplantation, and autoimmune conditions.  Hepatitis C blood testing is recommended for all people born from [birth year] through 1965 and any individual with known risks for hepatitis C.  Practice safe sex. Use condoms and avoid high-risk sexual practices to reduce the spread of sexually transmitted infections (STIs). STIs include gonorrhea, chlamydia, syphilis, trichomonas, herpes, HPV, and human immunodeficiency virus (HIV). Herpes, HIV, and HPV are viral illnesses that have no cure. They can result in disability, cancer, and death.  If you are a man who has sex with other men, you should be screened at least once per year for:  HIV.  Urethral, rectal, and pharyngeal infection of gonorrhea, chlamydia, or both.  If you are at risk of being infected with HIV, it is recommended that you take a prescription medicine daily to prevent HIV infection. This is called preexposure prophylaxis (PrEP). You are considered at risk if:  You are a man who has sex with other men (MSM) and have other risk factors.  You are a heterosexual man, are sexually active, and are at increased risk for HIV infection.  You take drugs by injection.  You are sexually active with a partner who has HIV.  Talk with your health care provider about whether you are at high risk of being infected with HIV. If you choose to begin PrEP, you should first be tested for HIV. You should then be tested every 3 months for as [place] as you are taking PrEP.  A one-time screening for abdominal aortic aneurysm (AAA) and surgical repair of large AAAs by ultrasound are recommended for men ages 30 to 34 years who are current or former  smokers.  Healthy men should no longer receive prostate-specific antigen (PSA) blood tests as part of routine cancer screening. Talk with your health care provider about prostate cancer screening.  Testicular cancer screening is not recommended for adult males who have no symptoms. Screening includes self-exam, a health care provider exam, and other screening tests. Consult with your health care provider about any symptoms you have or any concerns you have about testicular cancer.  Use sunscreen. Apply sunscreen liberally and repeatedly throughout the day. You should seek shade when your shadow is shorter than you. Protect yourself by wearing Leisner sleeves, pants, a wide-brimmed hat, and sunglasses year round, whenever you are outdoors.  Once a month, do a whole-body skin exam, using a mirror to look at the skin on your back. Tell your health care provider about new moles, moles that have irregular borders, moles that are larger than a pencil eraser, or moles that have changed in shape or color.  Stay current with required vaccines (immunizations).  Influenza vaccine. All adults should be immunized every year.  Tetanus, diphtheria, and acellular pertussis (Td, Tdap) vaccine. An adult who has not previously received Tdap or who does not know his vaccine status should receive 1 dose of Tdap. This initial dose should be followed by tetanus and diphtheria toxoids (Td) booster doses every 10 years. Adults with an unknown or incomplete history of completing a 3-dose immunization series with Td-containing vaccines should begin or complete a primary immunization series including a Tdap dose. Adults should receive a Td booster every 10 years.  Varicella vaccine. An adult without evidence of immunity to varicella should receive 2 doses or a second dose if he has previously received 1 dose.  Human papillomavirus (HPV) vaccine. Males aged 11-21 years who have not received the vaccine previously should receive  the 3-dose series. Males aged 22-26 years may be immunized. Immunization is recommended through the age of 42 years for any male who has sex with males and did not get any or all doses earlier. Immunization is recommended for any person with an immunocompromised condition through the age of 30 years if he did not get any or all doses earlier. During the 3-dose series, the second dose should be obtained 4-8 weeks after the first dose. The third dose should be obtained 24 weeks after the first dose and 16 weeks after the second dose.  Zoster vaccine. One dose is recommended for adults aged 39 years or older  unless certain conditions are present.  Measles, mumps, and rubella (MMR) vaccine. Adults born before [birth year] generally are considered immune to measles and mumps. Adults born in [birth year] or later should have 1 or more doses of MMR vaccine unless there is a contraindication to the vaccine or there is laboratory evidence of immunity to each of the three diseases. A routine second dose of MMR vaccine should be obtained at least 28 days after the first dose for students attending postsecondary schools, health care workers, or international travelers. People who received inactivated measles vaccine or an unknown type of measles vaccine during 1963-1967 should receive 2 doses of MMR vaccine. People who received inactivated mumps vaccine or an unknown type of mumps vaccine before 1979 and are at high risk for mumps infection should consider immunization with 2 doses of MMR vaccine. Unvaccinated health care workers born before [birth year] who lack laboratory evidence of measles, mumps, or rubella immunity or laboratory confirmation of disease should consider measles and mumps immunization with 2 doses of MMR vaccine or rubella immunization with 1 dose of MMR vaccine.  Pneumococcal 13-valent conjugate (PCV13) vaccine. When indicated, a person who is uncertain of his immunization history and has no record of immunization should  receive the PCV13 vaccine. All adults 61 years of age and older should receive this vaccine. An adult aged 8 years or older who has certain medical conditions and has not been previously immunized should receive 1 dose of PCV13 vaccine. This PCV13 should be followed with a dose of pneumococcal polysaccharide (PPSV23) vaccine. Adults who are at high risk for pneumococcal disease should obtain the PPSV23 vaccine at least 8 weeks after the dose of PCV13 vaccine. Adults older than 68 years of age who have normal immune system function should obtain the PPSV23 vaccine dose at least 1 year after the dose of PCV13 vaccine.  Pneumococcal polysaccharide (PPSV23) vaccine. When PCV13 is also indicated, PCV13 should be obtained first. All adults aged 14 years and older should be immunized. An adult younger than age 66 years who has certain medical conditions should be immunized. Any person who resides in a nursing home or [facility]-term care facility should be immunized. An adult smoker should be immunized. People with an immunocompromised condition and certain other conditions should receive both PCV13 and PPSV23 vaccines. People with human immunodeficiency virus (HIV) infection should be immunized as soon as possible after diagnosis. Immunization during chemotherapy or radiation therapy should be avoided. Routine use of PPSV23 vaccine is not recommended for American Indians, [place] Natives, or people younger than 65 years unless there are medical conditions that require PPSV23 vaccine. When indicated, people who have unknown immunization and have no record of immunization should receive PPSV23 vaccine. One-time revaccination 5 years after the first dose of PPSV23 is recommended for people aged 19-64 years who have chronic kidney failure, nephrotic syndrome, asplenia, or immunocompromised conditions. People who received 1-2 doses of PPSV23 before age 46 years should receive another dose of PPSV23 vaccine at age 52 years or later  if at least 5 years have passed since the previous dose. Doses of PPSV23 are not needed for people immunized with PPSV23 at or after age 93 years.  Meningococcal vaccine. Adults with asplenia or persistent complement component deficiencies should receive 2 doses of quadrivalent meningococcal conjugate (MenACWY-D) vaccine. The doses should be obtained at least 2 months apart. Microbiologists working with certain meningococcal bacteria, [employer] recruits, people at risk during an outbreak, and people who travel to or live in countries  with a high rate of meningitis should be immunized. A first-year college student up through age 95 years who is living in a residence hall should receive a dose if he did not receive a dose on or after his 16th birthday. Adults who have certain high-risk conditions should receive one or more doses of vaccine.  Hepatitis A vaccine. Adults who wish to be protected from this disease, have chronic liver disease, work with hepatitis A-infected animals, work in hepatitis A research labs, or travel to or work in countries with a high rate of hepatitis A should be immunized. Adults who were previously unvaccinated and who anticipate close contact with an international adoptee during the first 60 days after arrival in the [place] from a country with a high rate of hepatitis A should be immunized.  Hepatitis B vaccine. Adults should be immunized if they wish to be protected from this disease, are under age 37 years and have diabetes, have chronic liver disease, have had more than one sex partner in the past 6 months, may be exposed to blood or other infectious body fluids, are household contacts or sex partners of hepatitis B positive people, are clients or workers in certain care facilities, or travel to or work in countries with a high rate of hepatitis B.  Haemophilus influenzae type b (Hib) vaccine. A previously unvaccinated person with asplenia or sickle cell disease or having a  scheduled splenectomy should receive 1 dose of Hib vaccine. Regardless of previous immunization, a recipient of a hematopoietic stem cell transplant should receive a 3-dose series 6-12 months after his successful transplant. Hib vaccine is not recommended for adults with HIV infection. Preventive Service / Frequency Ages 55 to 104  Blood pressure check.** / Every 3-5 years.  Lipid and cholesterol check.** / Every 5 years beginning at age 19.  Hepatitis C blood test.** / For any individual with known risks for hepatitis C.  Skin self-exam. / Monthly.  Influenza vaccine. / Every year.  Tetanus, diphtheria, and acellular pertussis (Tdap, Td) vaccine.** / Consult your health care provider. 1 dose of Td every 10 years.  Varicella vaccine.** / Consult your health care provider.  HPV vaccine. / 3 doses over 6 months, if 41 or younger.  Measles, mumps, rubella (MMR) vaccine.** / You need at least 1 dose of MMR if you were born in 1957 or later. You may also need a second dose.  Pneumococcal 13-valent conjugate (PCV13) vaccine.** / Consult your health care provider.  Pneumococcal polysaccharide (PPSV23) vaccine.** / 1 to 2 doses if you smoke cigarettes or if you have certain conditions.  Meningococcal vaccine.** / 1 dose if you are age 83 to 23 years and a Market researcher living in a residence hall, or have one of several medical conditions. You may also need additional booster doses.  Hepatitis A vaccine.** / Consult your health care provider.  Hepatitis B vaccine.** / Consult your health care provider.  Haemophilus influenzae type b (Hib) vaccine.** / Consult your health care provider. Ages 59 to 67  Blood pressure check.** / Every year.  Lipid and cholesterol check.** / Every 5 years beginning at age 38.  Lung cancer screening. / Every year if you are aged 46-80 years and have a 30-pack-year history of smoking and currently smoke or have quit within the past 15 years.  Yearly screening is stopped once you have quit smoking for at least 15 years or develop a health problem that would prevent you from having lung  cancer treatment.  Fecal occult blood test (FOBT) of stool. / Every year beginning at age 50 and continuing until age 52. You may not have to do this test if you get a colonoscopy every 10 years.  Flexible sigmoidoscopy** or colonoscopy.** / Every 5 years for a flexible sigmoidoscopy or every 10 years for a colonoscopy beginning at age 23 and continuing until age 9.  Hepatitis C blood test.** / For all people born from [birth year] through 1965 and any individual with known risks for hepatitis C.  Skin self-exam. / Monthly.  Influenza vaccine. / Every year.  Tetanus, diphtheria, and acellular pertussis (Tdap/Td) vaccine.** / Consult your health care provider. 1 dose of Td every 10 years.  Varicella vaccine.** / Consult your health care provider.  Zoster vaccine.** / 1 dose for adults aged 29 years or older.  Measles, mumps, rubella (MMR) vaccine.** / You need at least 1 dose of MMR if you were born in 1957 or later. You may also need a second dose.  Pneumococcal 13-valent conjugate (PCV13) vaccine.** / Consult your health care provider.  Pneumococcal polysaccharide (PPSV23) vaccine.** / 1 to 2 doses if you smoke cigarettes or if you have certain conditions.  Meningococcal vaccine.** / Consult your health care provider.  Hepatitis A vaccine.** / Consult your health care provider.  Hepatitis B vaccine.** / Consult your health care provider.  Haemophilus influenzae type b (Hib) vaccine.** / Consult your health care provider. Ages 34 and over  Blood pressure check.** / Every year.  Lipid and cholesterol check.**/ Every 5 years beginning at age 74.  Lung cancer screening. / Every year if you are aged 78-80 years and have a 30-pack-year history of smoking and currently smoke or have quit within the past 15 years. Yearly screening is stopped once you  have quit smoking for at least 15 years or develop a health problem that would prevent you from having lung cancer treatment.  Fecal occult blood test (FOBT) of stool. / Every year beginning at age 37 and continuing until age 72. You may not have to do this test if you get a colonoscopy every 10 years.  Flexible sigmoidoscopy** or colonoscopy.** / Every 5 years for a flexible sigmoidoscopy or every 10 years for a colonoscopy beginning at age 54 and continuing until age 17.  Hepatitis C blood test.** / For all people born from [birth year] through 1965 and any individual with known risks for hepatitis C.  Abdominal aortic aneurysm (AAA) screening.** / A one-time screening for ages 59 to 27 years who are current or former smokers.  Skin self-exam. / Monthly.  Influenza vaccine. / Every year.  Tetanus, diphtheria, and acellular pertussis (Tdap/Td) vaccine.** / 1 dose of Td every 10 years.  Varicella vaccine.** / Consult your health care provider.  Zoster vaccine.** / 1 dose for adults aged 5 years or older.  Pneumococcal 13-valent conjugate (PCV13) vaccine.** / 1 dose for all adults aged 21 years and older.  Pneumococcal polysaccharide (PPSV23) vaccine.** / 1 dose for all adults aged 61 years and older.  Meningococcal vaccine.** / Consult your health care provider.  Hepatitis A vaccine.** / Consult your health care provider.  Hepatitis B vaccine.** / Consult your health care provider.  Haemophilus influenzae type b (Hib) vaccine.** / Consult your health care provider. **Family history and personal history of risk and conditions may change your health care provider's recommendations.   This information is not intended to replace advice given to you by your health care provider. Make  sure you discuss any questions you have with your health care provider.   Document Released: 04/12/2001 Document Revised: 03/07/2014 Document Reviewed: 07/12/2010 Elsevier Interactive Patient Education [employer]

## 2017-09-06 NOTE — Telephone Encounter (Signed)
Received referral for initial lung cancer screening scan. Contacted patient who would like to have lung screening in [place]. Will coordinate within next few weeks. Patient is agreeable to this plan.

## 2017-09-06 NOTE — Progress Notes (Signed)
Subjective:   [name] is a 67 y.o. male who presents for Medicare Annual/Subsequent preventive examination.  -We did briefly discuss him using CBD oil and creams for pain management  Activities of Daily Living In your present state of health, do you have difficulty performing the following activities?  1- Driving - no 2- Managing money - no 3- Feeding yourself - no 4- Getting from the bed to the chair - no 5- Climbing a flight of stairs - no 6- Preparing food and eating - no 7- Bathing or showering - no 8- Getting dressed - no 9- Getting to the toilet - no 10- Using the toilet - no 11- Moving around from place to place - no  Patient states that he feels safe in the home. Depression screen [facility] 2/9 09/06/2017 05/01/2017 01/06/2017 07/13/2016 03/11/2016  Decreased Interest 0 0 0 0 1  Down, Depressed, Hopeless 0 0 0 0 1  PHQ - 2 Score 0 0 0 0 2  Altered sleeping 0 0 0 0 0  Tired, decreased energy 0 1 0 0 0  Change in appetite 0 0 0 0 0  Feeling bad or failure about yourself  0 0 0 0 0  Trouble concentrating 0 0 0 0 0  Moving slowly or fidgety/restless 0 0 0 0 0  Suicidal thoughts 0 0 0 0 0  PHQ-9 Score 0 1 0 0 2  Difficult doing work/chores Not difficult at all Not difficult at all Not difficult at all - Not difficult at all   Fall Risk  09/06/2017 05/01/2017 01/06/2017 07/13/2016 03/11/2016  Falls in the past year? No No No No Yes  Number falls in past yr: - - - - 1  Injury with Fall? - - - - No   6CIT Screen 09/06/2017 07/13/2016  What Year? 0 points 0 points  What month? 0 points 0 points  What time? 0 points -  Count back from 20 0 points -  Months in reverse 0 points -  Repeat phrase 4 points -  Total Score 4 -    Current Exercise Habits: The patient does not participate in regular exercise at present    Functional Status Survey: Is the patient deaf or have difficulty hearing?: No Does the patient have difficulty seeing, even when wearing glasses/contacts?: No Does  the patient have difficulty concentrating, remembering, or making decisions?: No Does the patient have difficulty walking or climbing stairs?: No Does the patient have difficulty dressing or bathing?: No Does the patient have difficulty doing errands alone such as visiting a doctor's office or shopping?: No        Objective:    Vitals: BP 131/83   Pulse 62   Ht 5\' 6"  (1.676 m)   Wt 177 lb 6.4 oz (80.5 kg)   SpO2 97%   BMI 28.63 kg/m   Body mass index is 28.63 kg/m.  Advanced Directives 06/12/2017 07/29/2016 09/28/2015 08/07/2015 02/22/2015 11/20/2013  Does Patient Have a Medical Advance Directive? No Yes Yes Yes Yes No  Type of Advance Directive - - [place];Living will Healthcare Power of [place];Living will -  Does patient want to make changes to medical advance directive? - - No - Patient declined No - Patient declined No - Patient declined -  Copy of [place] in Chart? - - No - copy requested No - copy requested No - copy requested -  Would patient like information on  creating a medical advance directive? - - - - - No - patient declined information    Tobacco Social History   Tobacco Use  Smoking Status Former Smoker  . Packs/day: 1.00  . Years: 25.00  . Pack years: 25.00  . Types: Cigarettes  . Last attempt to quit: 02/28/2005  . Years since quitting: 12.5  Smokeless Tobacco Current User  . Types: Snuff     Ready to quit: Not Answered Counseling given: Not Answered   Clinical Intake:                       Past Medical History:  Diagnosis Date  . Arthritis   . Carotid artery occlusion   . Coronary atherosclerosis of native coronary artery 01/29/2013  . Duodenal erosion   . Esophageal stenosis   . GERD (gastroesophageal reflux disease)   . H. pylori infection   . Heart attack [facility]) Oct. 2009   Mild  . Hiatal hernia   . Hyperlipidemia   . Hypertension   . Shortness of breath  dyspnea    Past Surgical History:  Procedure Laterality Date  . APPENDECTOMY    . CARDIAC CATHETERIZATION N/A 08/07/2015   Procedure: Left Heart Cath and Coronary Angiography;  Surgeon: [name], MD;  Location: [place] [facility];  Service: Cardiovascular;  Laterality: N/A;  . CARDIAC CATHETERIZATION N/A 08/07/2015   Procedure: Coronary Stent Intervention;  Surgeon: [name], MD;  Location: [place] [facility];  Service: Cardiovascular;  Laterality: N/A;  . CARDIAC CATHETERIZATION N/A 08/07/2015   Procedure: Coronary Stent Intervention;  Surgeon: [name], MD;  Location: [place] [facility];  Service: Cardiovascular;  Laterality: N/A;  . CAROTID ENDARTERECTOMY  01/05/2006   Right  CEA with DPA  . CORONARY STENT PLACEMENT  08/07/2015   [facility]  . HIP SURGERY Left 10/2016   at [facility]  . SPINE SURGERY     Family History  Problem Relation Age of Onset  . Heart attack Mother   . Coronary artery disease Mother   . Heart disease Mother        Carotid Stenosis and BPG and Heart Disease before age 50  . Diabetes Mother   . Hypertension Mother   . Heart attack Father   . Heart disease Father        BPG and Heart Disease before age 39  . Hypertension Father   . Cancer Father 55       throat  . Colon cancer Neg Hx   . Colon polyps Neg Hx   . Esophageal cancer Neg Hx   . Rectal cancer Neg Hx   . Stomach cancer Neg Hx    Social History   Socioeconomic History  . Marital status: Married    Spouse name: Not on file  . Number of children: Not on file  . Years of education: Not on file  . Highest education level: Not on file  Occupational History  . Not on file  Social Needs  . Financial resource strain: Not on file  . Food insecurity:    Worry: Not on file    Inability: Not on file  . Transportation needs:    Medical: Not on file    Non-medical: Not on file  Tobacco Use  . Smoking status: Former Smoker    Packs/day: 1.00    Years: 25.00    Pack years: 25.00     Types: Cigarettes  Last attempt to quit: 02/28/2005    Years since quitting: 12.5  . Smokeless tobacco: Current User    Types: Snuff  Substance and Sexual Activity  . Alcohol use: Yes    Alcohol/week: 3.0 - 3.6 oz    Types: 5 - 6 Glasses of wine per week  . Drug use: No  . Sexual activity: Not on file  Lifestyle  . Physical activity:    Days per week: Not on file    Minutes per session: Not on file  . Stress: Not on file  Relationships  . Social connections:    Talks on phone: Not on file    Gets together: Not on file    Attends religious service: Not on file    Active member of club or organization: Not on file    Attends meetings of clubs or organizations: Not on file    Relationship status: Not on file  Other Topics Concern  . Not on file  Social History Narrative  . Not on file    Outpatient Encounter Medications as of 09/06/2017  Medication Sig  . AMBULATORY NON FORMULARY MEDICATION Take 180 mg by mouth daily. Medication Name: bempedoic acid vs placebo. CLEAR Research study drug provided  . amLODipine (NORVASC) 5 MG tablet TAKE 1 TABLET BY MOUTH DAILY  . aspirin EC 81 MG tablet Take 1 tablet (81 mg total) by mouth daily.  . celecoxib (CELEBREX) 200 MG capsule Take 1 capsule once a week by mouth.   . cyclobenzaprine (FLEXERIL) 10 MG tablet TAKE 1 TABLET BY MOUTH AT BEDTIME.  Marland Kitchen gabapentin (NEURONTIN) 100 MG capsule Take 2 capsules (200 mg total) by mouth at bedtime.  Marland Kitchen lisinopril (PRINIVIL,ZESTRIL) 10 MG tablet Take 1 tablet (10 mg total) by mouth daily.  . meloxicam (MOBIC) 7.5 MG tablet Take 2 tablets (15 mg total) by mouth daily. Take 1-2 tablets daily by mouth for severe joint pains  . metoprolol succinate (TOPROL-XL) 25 MG 24 hr tablet TAKE 1 TABLET BY MOUTH ONCE DAILY.  . nitroGLYCERIN (NITROSTAT) 0.4 MG SL tablet Place 1 tablet (0.4 mg total) under the tongue every 5 (five) minutes as needed for chest pain.  . pantoprazole (PROTONIX) 40 MG tablet Take 1 tablet (40  mg total) by mouth daily.  . rosuvastatin (CRESTOR) 10 MG tablet Take 10 mg by mouth once a week.  . traMADol (ULTRAM) 50 MG tablet Take 50 mg by mouth every 6 (six) hours as needed for moderate pain.   . [DISCONTINUED] gabapentin (NEURONTIN) 100 MG capsule Take 2 capsules (200 mg total) by mouth at bedtime.   Facility-Administered Encounter Medications as of 09/06/2017  Medication  . 0.9 %  sodium chloride infusion  . 0.9 %  sodium chloride infusion  . 0.9 %  sodium chloride infusion    Activities of Daily Living In your present state of health, do you have any difficulty performing the following activities: 09/06/2017  Hearing? N  Vision? N  Difficulty concentrating or making decisions? N  Walking or climbing stairs? N  Dressing or bathing? N  Doing errands, shopping? N  Some recent data might be hidden    Patient Care Team: Mellody Dance, DO as PCP - General (Family Medicine) Ladene Artist, MD as Consulting Physician (Gastroenterology) [name], MD as Consulting Physician (Cardiology) Chari Manning, MD as Attending Physician (Orthopedic Surgery) Nickel, Sharmon Leyden, NP as Nurse Practitioner (Vascular Surgery)   Assessment:   This is a routine wellness examination for  Jeneen Rinks.  Exercise Activities and Dietary recommendations Current Exercise Habits: The patient does not participate in regular exercise at present  Goals    . LDL CALC < 70     Given baseline LDL b/t 160-200 mg/dL, would like to see LDL at least 80 mg/dL, but prefer < 70 mg/dL       Fall Risk Fall Risk  09/06/2017 05/01/2017 01/06/2017 07/13/2016 03/11/2016  Falls in the past year? No No No No Yes  Number falls in past yr: - - - - 1  Injury with Fall? - - - - No   Is the patient's home free of loose throw rugs in walkways, pet beds, electrical cords, etc?   yes      Grab bars in the bathroom? no      Handrails on the stairs?   yes      Adequate lighting?   yes  Timed Get Up and Go  Performed: passed  Depression Screen PHQ 2/9 Scores 09/06/2017 05/01/2017 01/06/2017 07/13/2016  PHQ - 2 Score 0 0 0 0  PHQ- 9 Score 0 1 0 0    Cognitive Function     6CIT Screen 09/06/2017 07/13/2016  What Year? 0 points 0 points  What month? 0 points 0 points  What time? 0 points -  Count back from 20 0 points -  Months in reverse 0 points -  Repeat phrase 4 points -  Total Score 4 -    Immunization History  Administered Date(s) Administered  . Influenza, High Dose Seasonal PF 12/23/2016  . Influenza-Unspecified 12/15/2015, 12/23/2016  . Pneumococcal Conjugate-13 06/15/2015  . Pneumococcal Polysaccharide-23 01/06/2017  . Tdap 04/18/2012    Qualifies for Shingles Vaccine? Yes, patient given information to verify coverage through insurance.  Screening Tests Health Maintenance  Topic Date Due  . Hepatitis C Screening  09/07/2018 (Originally Aug 03, 1950)  . INFLUENZA VACCINE  09/28/2017  . COLONOSCOPY  05/21/2018  . TETANUS/TDAP  04/18/2022  . PNA vac Low Risk Adult  Completed   Cancer Screenings: Lung: Low Dose CT Chest recommended if Age 76-80 years, 30 pack-year currently smoking OR have quit w/in 15years. Patient does qualify.  Patient had LDCT Scan done on 07/26/2016.  Colorectal: 05/20/2008  Additional Screenings:  Hepatitis C Screening:n/a      Plan:  - Patient is moving to [place] and this will be his last office visit with us-since he is moving in 2 weeks.  He is aware there is a Music therapist facility there in [place] and I recommend he become established with a provider there. -Refill Neurontin given per patient request  -See orders below for additional things address this OV:   Orders Placed This Encounter  Procedures  . CT CHEST LUNG CANCER SCREENING LOW DOSE WO CONTRAST  . CBC with Differential/Platelet  . Comprehensive metabolic panel  . Hemoglobin A1c  . Lipid panel  . TSH  . VITAMIN D 25 Hydroxy (Vit-D Deficiency, Fractures)  . Vitamin B12  . T4, free     I have personally reviewed and noted the following in the patient's chart:   . Medical and social history . Use of alcohol, tobacco or illicit drugs  . Current medications and supplements . Functional ability and status . Nutritional status . Physical activity . Advanced directives . List of other physicians . Hospitalizations, surgeries, and ER visits in previous 12 months . Vitals . Screenings to include cognitive, depression, and falls . Referrals and appointments  In addition, I have reviewed and  discussed with patient certain preventive protocols, quality metrics, and best practice recommendations. A written personalized care plan for preventive services as well as general preventive health recommendations were provided to patient.     Mellody Dance, DO  09/06/2017

## 2017-09-07 LAB — COMPREHENSIVE METABOLIC PANEL
ALK PHOS: 66 IU/L (ref 39–117)
ALT: 30 IU/L (ref 0–44)
AST: 29 IU/L (ref 0–40)
Albumin/Globulin Ratio: 1.5 (ref 1.2–2.2)
Albumin: 4.6 g/dL (ref 3.6–4.8)
BILIRUBIN TOTAL: 0.4 mg/dL (ref 0.0–1.2)
BUN/Creatinine Ratio: 25 — ABNORMAL HIGH (ref 10–24)
BUN: 27 mg/dL (ref 8–27)
CHLORIDE: 99 mmol/L (ref 96–106)
CO2: 23 mmol/L (ref 20–29)
Calcium: 9.9 mg/dL (ref 8.6–10.2)
Creatinine, Ser: 1.1 mg/dL (ref 0.76–1.27)
GFR calc Af Amer: 80 mL/min/{1.73_m2} (ref >59)
GFR calc non Af Amer: 69 mL/min/{1.73_m2} (ref >59)
GLUCOSE: 98 mg/dL (ref 65–99)
Globulin, Total: 3.1 g/dL (ref 1.5–4.5)
Potassium: 5.8 mmol/L — ABNORMAL HIGH (ref 3.5–5.2)
Sodium: 138 mmol/L (ref 134–144)
TOTAL PROTEIN: 7.7 g/dL (ref 6.0–8.5)

## 2017-09-07 LAB — CBC WITH DIFFERENTIAL/PLATELET
BASOS ABS: 0 10*3/uL (ref 0.0–0.2)
Basos: 1 %
EOS (ABSOLUTE): 0.4 10*3/uL (ref 0.0–0.4)
Eos: 7 %
Hematocrit: 44.7 % (ref 37.5–51.0)
Hemoglobin: 14.6 g/dL (ref 13.0–17.7)
IMMATURE GRANS (ABS): 0 10*3/uL (ref 0.0–0.1)
Immature Granulocytes: 1 %
LYMPHS: 30 %
Lymphocytes Absolute: 1.6 10*3/uL (ref 0.7–3.1)
MCH: 30 pg (ref 26.6–33.0)
MCHC: 32.7 g/dL (ref 31.5–35.7)
MCV: 92 fL (ref 79–97)
Monocytes Absolute: 0.5 10*3/uL (ref 0.1–0.9)
Monocytes: 9 %
NEUTROS PCT: 52 %
Neutrophils Absolute: 2.7 10*3/uL (ref 1.4–7.0)
PLATELETS: 235 10*3/uL (ref 150–450)
RBC: 4.86 x10E6/uL (ref 4.14–5.80)
RDW: 13.5 % (ref 12.3–15.4)
WBC: 5.2 10*3/uL (ref 3.4–10.8)

## 2017-09-07 LAB — LIPID PANEL
Chol/HDL Ratio: 4.3 ratio (ref 0.0–5.0)
Cholesterol, Total: 210 mg/dL — ABNORMAL HIGH (ref 100–199)
HDL: 49 mg/dL (ref >39)
LDL Calculated: 128 mg/dL — ABNORMAL HIGH (ref 0–99)
Triglycerides: 166 mg/dL — ABNORMAL HIGH (ref 0–149)
VLDL Cholesterol Cal: 33 mg/dL (ref 5–40)

## 2017-09-07 LAB — TSH: TSH: 1.42 u[IU]/mL (ref 0.450–4.500)

## 2017-09-07 LAB — T4, FREE: FREE T4: 1.3 ng/dL (ref 0.82–1.77)

## 2017-09-07 LAB — VITAMIN D 25 HYDROXY (VIT D DEFICIENCY, FRACTURES): VIT D 25 HYDROXY: 42.7 ng/mL (ref 30.0–100.0)

## 2017-09-07 LAB — VITAMIN B12: VITAMIN B 12: 313 pg/mL (ref 232–1245)

## 2017-09-07 LAB — HEMOGLOBIN A1C
Est. average glucose Bld gHb Est-mCnc: 114 mg/dL
HEMOGLOBIN A1C: 5.6 % (ref 4.8–5.6)

## 2017-09-12 ENCOUNTER — Telehealth: Payer: Self-pay | Admitting: *Deleted

## 2017-09-12 DIAGNOSIS — Z122 Encounter for screening for malignant neoplasm of respiratory organs: Secondary | ICD-10-CM

## 2017-09-12 DIAGNOSIS — Z87891 Personal history of nicotine dependence: Secondary | ICD-10-CM

## 2017-09-12 NOTE — Telephone Encounter (Signed)
Received referral for initial lung cancer screening scan. Contacted patient and obtained smoking history,(former, quit 2007, 43.75 pack year) as well as answering questions related to screening process. Patient denies signs of lung cancer such as weight loss or hemoptysis. Patient denies comorbidity that would prevent curative treatment if lung cancer were found. Patient is scheduled for shared decision making visit and CT scan on 09/19/17.

## 2017-09-14 ENCOUNTER — Telehealth: Payer: Self-pay | Admitting: *Deleted

## 2017-09-14 NOTE — Telephone Encounter (Signed)
Spoke with subject re: the Clear research study, T9-M21.  No cos, aes or saes to report.  Next clinic visit confirmed. Subject moving to [place], [place] and very excited to be closer to family.

## 2017-09-18 ENCOUNTER — Ambulatory Visit: Payer: PPO

## 2017-09-19 ENCOUNTER — Ambulatory Visit
Admission: RE | Admit: 2017-09-19 | Discharge: 2017-09-19 | Disposition: A | Discharge status: Home / Self Care | Payer: PPO | Source: Ambulatory Visit | Attending: Oncology | Admitting: Oncology

## 2017-09-19 ENCOUNTER — Encounter: Payer: Self-pay | Admitting: *Deleted

## 2017-09-19 ENCOUNTER — Inpatient Hospital Stay: Payer: PPO | Attending: Oncology | Admitting: Oncology

## 2017-09-19 DIAGNOSIS — R918 Other nonspecific abnormal finding of lung field: Secondary | ICD-10-CM | POA: Diagnosis not present

## 2017-09-19 DIAGNOSIS — Z122 Encounter for screening for malignant neoplasm of respiratory organs: Secondary | ICD-10-CM | POA: Insufficient documentation

## 2017-09-19 DIAGNOSIS — Z87891 Personal history of nicotine dependence: Secondary | ICD-10-CM | POA: Diagnosis not present

## 2017-09-20 ENCOUNTER — Other Ambulatory Visit: Payer: Self-pay

## 2017-09-20 MED ORDER — GABAPENTIN 100 MG PO CAPS: 200.0000 mg | ORAL_CAPSULE | Freq: Every day | ORAL | 1 refills | Status: DC

## 2017-09-20 NOTE — Telephone Encounter (Signed)
Refill

## 2017-09-22 DIAGNOSIS — H2513 Age-related nuclear cataract, bilateral: Secondary | ICD-10-CM | POA: Diagnosis not present

## 2017-11-07 ENCOUNTER — Encounter: Payer: Self-pay | Admitting: Internal Medicine

## 2017-11-07 ENCOUNTER — Ambulatory Visit (INDEPENDENT_AMBULATORY_CARE_PROVIDER_SITE_OTHER): Payer: PPO | Admitting: Internal Medicine

## 2017-11-07 VITALS — BP 126/68 | HR 71 | Ht 66.0 in | Wt 176.0 lb

## 2017-11-07 DIAGNOSIS — M546 Pain in thoracic spine: Secondary | ICD-10-CM | POA: Diagnosis not present

## 2017-11-07 DIAGNOSIS — G8929 Other chronic pain: Secondary | ICD-10-CM | POA: Diagnosis not present

## 2017-11-07 DIAGNOSIS — E782 Mixed hyperlipidemia: Secondary | ICD-10-CM

## 2017-11-07 DIAGNOSIS — M79671 Pain in right foot: Secondary | ICD-10-CM

## 2017-11-07 DIAGNOSIS — I2510 Atherosclerotic heart disease of native coronary artery without angina pectoris: Secondary | ICD-10-CM | POA: Diagnosis not present

## 2017-11-07 DIAGNOSIS — M79672 Pain in left foot: Secondary | ICD-10-CM | POA: Diagnosis not present

## 2017-11-07 DIAGNOSIS — I10 Essential (primary) hypertension: Secondary | ICD-10-CM | POA: Diagnosis not present

## 2017-11-07 NOTE — Progress Notes (Signed)
Date:  11/07/2017   Name:  [name]   DOB:  Jul 10, 1950   MRN:  [id number]   Chief Complaint: Establish Care (Refill gabapentin and tramadol.  Moved from [place] to [place] and needs PCP in the area. )  Foot Injury   There was no injury mechanism. The pain is present in the left foot and right foot. The quality of the pain is described as burning. The pain has been fluctuating since onset. Treatments tried: gabapentin. The treatment provided moderate relief.  Hip Pain   There was no injury mechanism. The pain is present in the left hip. The quality of the pain is described as aching. The pain is moderate. The pain has been fluctuating since onset. He has tried NSAIDs (and tramadol) for the symptoms. The treatment provided moderate relief.  Hypertension  This is a chronic problem. The problem is unchanged. The problem is controlled. Pertinent negatives include no chest pain, headaches, palpitations or shortness of breath.   He takes gabapentin several times per week for foot pain.  He also has Tramadol which he takes about once a week for hip pain or back pain to allow him to play golf, etc.  He may have the Tramadol from Orthopedics.  He still has about 10 tablets.  Review of Systems  Constitutional: Negative for chills and fatigue.  Respiratory: Negative for chest tightness, shortness of breath and wheezing.   Cardiovascular: Negative for chest pain, palpitations and leg swelling.  Gastrointestinal: Negative for abdominal pain.  Musculoskeletal: Positive for arthralgias and gait problem.  Allergic/Immunologic: Negative for environmental allergies.  Neurological: Negative for dizziness, light-headedness and headaches.  Psychiatric/Behavioral: Negative for dysphoric mood and sleep disturbance.    Patient Active Problem List   Diagnosis Date Noted  . 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 Foot pain, bilateral- chronic many yrs 05/01/2017    . Disorder of tendon 03/01/2017  . Left-sided thoracic back pain 10/17/2016  . Tobacco use disorder, moderate, in sustained remission 07/13/2016  . Overweight (BMI 25.0-29.9) 07/13/2016  . 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[place]) 07/13/2016  . Dysphagia 11/26/2015  . Chronic anticoagulation: plavix 10/15/2015  . Chronic left hip pain 09/15/2015  . Greater trochanteric bursitis of left hip 09/15/2015  . Coronary atherosclerosis of native coronary artery 08/08/2015  . Essential hypertension 08/08/2015  . Hyperlipidemia 01/29/2013  . Carotid stenosis-s/p RCE 11/08/2011    Allergies  Allergen Reactions  . Brilinta [Ticagrelor]     Shortness of breath  . Zetia [Ezetimibe] Other (See Comments)    Muscle aches  . Statins Other (See Comments)    Failed Crestor 5 mg twice weekly, Crestor 20 mg daily, Pravastatin 40 mg qd, Lipitor, Zocor - muscle aches Failed Crestor 5 mg twice weekly, Crestor 20 mg daily, Pravastatin 40 mg qd, Lipitor, Zocor - muscle aches    Past Surgical History:  Procedure Laterality Date  . APPENDECTOMY    . CARDIAC CATHETERIZATION N/A 08/07/2015   Procedure: Left Heart Cath and Coronary Angiography;  Surgeon: [name], MD;  Location: [place] [facility];  Service: Cardiovascular;  Laterality: N/A;  . CARDIAC CATHETERIZATION N/A 08/07/2015   Procedure: Coronary Stent Intervention;  Surgeon: [name], MD;  Location: [place] [facility];  Service: Cardiovascular;  Laterality: N/A;  . CARDIAC CATHETERIZATION N/A 08/07/2015   Procedure: Coronary Stent Intervention;  Surgeon: [name], MD;  Location: [place] [facility];  Service: Cardiovascular;  Laterality: N/A;  . CAROTID  ENDARTERECTOMY  01/05/2006   Right  CEA with DPA  . CORONARY STENT PLACEMENT  08/07/2015   [facility]  . HIP SURGERY Left 10/2016   left hip tendon repair  . SPINE SURGERY      Social History   Tobacco Use  . Smoking status: Former Smoker    Packs/day: 1.25    Years: 35.00    Pack years:  43.75    Types: Cigarettes    Last attempt to quit: 02/28/2005    Years since quitting: 12.6  . Smokeless tobacco: Current User    Types: Snuff  Substance Use Topics  . Alcohol use: Yes    Alcohol/week: 8.0 - 10.0 standard drinks    Types: 8 - 10 Glasses of wine per week  . Drug use: No     Medication list has been reviewed and updated.  Current Meds  Medication Sig  . amLODipine (NORVASC) 5 MG tablet TAKE 1 TABLET BY MOUTH DAILY  . aspirin EC 81 MG tablet Take 1 tablet (81 mg total) by mouth daily.  . cyclobenzaprine (FLEXERIL) 10 MG tablet TAKE 1 TABLET BY MOUTH AT BEDTIME.  Marland Kitchen gabapentin (NEURONTIN) 100 MG capsule Take 2 capsules (200 mg total) by mouth at bedtime.  Marland Kitchen lisinopril (PRINIVIL,ZESTRIL) 10 MG tablet Take 1 tablet (10 mg total) by mouth daily.  . meloxicam (MOBIC) 7.5 MG tablet Take 2 tablets (15 mg total) by mouth daily. Take 1-2 tablets daily by mouth for severe joint pains  . metoprolol succinate (TOPROL-XL) 25 MG 24 hr tablet TAKE 1 TABLET BY MOUTH ONCE DAILY.  . nitroGLYCERIN (NITROSTAT) 0.4 MG SL tablet Place 1 tablet (0.4 mg total) under the tongue every 5 (five) minutes as needed for chest pain.  . traMADol (ULTRAM) 50 MG tablet Take 50 mg by mouth every 6 (six) hours as needed for moderate pain.   . [DISCONTINUED] celecoxib (CELEBREX) 200 MG capsule Take 1 capsule once a week by mouth.    Current Facility-Administered Medications for the 11/07/17 encounter (Office Visit) with Glean Hess, MD  Medication  . 0.9 %  sodium chloride infusion  . 0.9 %  sodium chloride infusion  . 0.9 %  sodium chloride infusion    PHQ 2/9 Scores 09/06/2017 05/01/2017 01/06/2017 07/13/2016  PHQ - 2 Score 0 0 0 0  PHQ- 9 Score 0 1 0 0    Physical Exam  Constitutional: He is oriented to person, place, and time. He appears well-developed. No distress.  HENT:  Head: Normocephalic and atraumatic.  Neck: Normal range of motion. Neck supple.  Cardiovascular: Normal rate, regular  rhythm and normal heart sounds.  Pulmonary/Chest: Effort normal and breath sounds normal. No respiratory distress.  Musculoskeletal: He exhibits tenderness. He exhibits no edema.  Neurological: He is alert and oriented to person, place, and time.  Skin: Skin is warm and dry. No rash noted.  Psychiatric: He has a normal mood and affect. His behavior is normal. Thought content normal.  Nursing note and vitals reviewed.   BP 126/68 (BP Location: Right Arm, Patient Position: Sitting, Cuff Size: Normal)   Pulse 71   Ht 5\' 6"  (1.676 m)   Wt 176 lb (79.8 kg)   SpO2 95%   BMI 28.41 kg/m   Assessment and Plan: 1. Foot pain, bilateral- chronic many yrs Continue gabapentin - just refilled in July  2. Chronic left-sided thoracic back pain Intermittent, take mobic as needed  3. Essential hypertension Controlled  4. Mixed hyperlipidemia In a  drug study -  Has been intolerant of multiple statins  5. Atherosclerosis of native coronary artery of native heart without angina pectoris Continue aspirin, beta blocker and CCB from cardiologist   No orders of the defined types were placed in this encounter.   Partially dictated using Editor, commissioning. Any errors are unintentional.  Halina Maidens, MD [facility]  11/07/2017

## 2017-11-16 DIAGNOSIS — H2512 Age-related nuclear cataract, left eye: Secondary | ICD-10-CM | POA: Diagnosis not present

## 2017-11-24 ENCOUNTER — Other Ambulatory Visit: Payer: Self-pay

## 2017-11-24 ENCOUNTER — Inpatient Hospital Stay
Admission: EM | Admit: 2017-11-24 | Discharge: 2017-11-27 | DRG: 287 | Disposition: A | Discharge status: Home / Self Care | Payer: PPO | Attending: Internal Medicine | Admitting: Internal Medicine

## 2017-11-24 ENCOUNTER — Ambulatory Visit (INDEPENDENT_AMBULATORY_CARE_PROVIDER_SITE_OTHER)
Admission: EM | Admit: 2017-11-24 | Discharge: 2017-11-24 | Disposition: A | Discharge status: Home / Self Care | Payer: PPO | Source: Home / Self Care | Attending: Emergency Medicine | Admitting: Emergency Medicine

## 2017-11-24 ENCOUNTER — Emergency Department: Payer: PPO

## 2017-11-24 ENCOUNTER — Encounter: Payer: Self-pay | Admitting: Emergency Medicine

## 2017-11-24 DIAGNOSIS — I739 Peripheral vascular disease, unspecified: Secondary | ICD-10-CM | POA: Diagnosis present

## 2017-11-24 DIAGNOSIS — I25119 Atherosclerotic heart disease of native coronary artery with unspecified angina pectoris: Secondary | ICD-10-CM | POA: Diagnosis not present

## 2017-11-24 DIAGNOSIS — Z87891 Personal history of nicotine dependence: Secondary | ICD-10-CM

## 2017-11-24 DIAGNOSIS — I10 Essential (primary) hypertension: Secondary | ICD-10-CM

## 2017-11-24 DIAGNOSIS — E7800 Pure hypercholesterolemia, unspecified: Secondary | ICD-10-CM | POA: Diagnosis present

## 2017-11-24 DIAGNOSIS — M1990 Unspecified osteoarthritis, unspecified site: Secondary | ICD-10-CM

## 2017-11-24 DIAGNOSIS — E785 Hyperlipidemia, unspecified: Secondary | ICD-10-CM | POA: Diagnosis present

## 2017-11-24 DIAGNOSIS — R0789 Other chest pain: Secondary | ICD-10-CM | POA: Diagnosis present

## 2017-11-24 DIAGNOSIS — Z79899 Other long term (current) drug therapy: Secondary | ICD-10-CM | POA: Diagnosis not present

## 2017-11-24 DIAGNOSIS — Z7982 Long term (current) use of aspirin: Secondary | ICD-10-CM | POA: Diagnosis not present

## 2017-11-24 DIAGNOSIS — I429 Cardiomyopathy, unspecified: Secondary | ICD-10-CM | POA: Diagnosis present

## 2017-11-24 DIAGNOSIS — I200 Unstable angina: Secondary | ICD-10-CM | POA: Diagnosis present

## 2017-11-24 DIAGNOSIS — I2510 Atherosclerotic heart disease of native coronary artery without angina pectoris: Secondary | ICD-10-CM | POA: Diagnosis present

## 2017-11-24 DIAGNOSIS — I252 Old myocardial infarction: Secondary | ICD-10-CM

## 2017-11-24 DIAGNOSIS — I447 Left bundle-branch block, unspecified: Secondary | ICD-10-CM | POA: Diagnosis not present

## 2017-11-24 DIAGNOSIS — K219 Gastro-esophageal reflux disease without esophagitis: Secondary | ICD-10-CM | POA: Diagnosis present

## 2017-11-24 DIAGNOSIS — Z955 Presence of coronary angioplasty implant and graft: Secondary | ICD-10-CM | POA: Diagnosis not present

## 2017-11-24 DIAGNOSIS — I208 Other forms of angina pectoris: Secondary | ICD-10-CM | POA: Diagnosis not present

## 2017-11-24 DIAGNOSIS — Z8249 Family history of ischemic heart disease and other diseases of the circulatory system: Secondary | ICD-10-CM | POA: Diagnosis not present

## 2017-11-24 DIAGNOSIS — R079 Chest pain, unspecified: Secondary | ICD-10-CM

## 2017-11-24 DIAGNOSIS — I340 Nonrheumatic mitral (valve) insufficiency: Secondary | ICD-10-CM | POA: Diagnosis not present

## 2017-11-24 LAB — CBC
HEMATOCRIT: 40 % (ref 40.0–52.0)
HEMOGLOBIN: 14 g/dL (ref 13.0–18.0)
MCH: 32.2 pg (ref 26.0–34.0)
MCHC: 35 g/dL (ref 32.0–36.0)
MCV: 92.1 fL (ref 80.0–100.0)
Platelets: 239 10*3/uL (ref 150–440)
RBC: 4.35 MIL/uL — ABNORMAL LOW (ref 4.40–5.90)
RDW: 13.2 % (ref 11.5–14.5)
WBC: 5.5 10*3/uL (ref 3.8–10.6)

## 2017-11-24 LAB — BASIC METABOLIC PANEL
Anion gap: 16 — ABNORMAL HIGH (ref 5–15)
BUN: 24 mg/dL — ABNORMAL HIGH (ref 8–23)
CALCIUM: 9.1 mg/dL (ref 8.9–10.3)
CO2: 22 mmol/L (ref 22–32)
Chloride: 102 mmol/L (ref 98–111)
Creatinine, Ser: 0.95 mg/dL (ref 0.61–1.24)
GFR calc Af Amer: >60 mL/min (ref >60)
GFR calc non Af Amer: >60 mL/min (ref >60)
GLUCOSE: 104 mg/dL — AB (ref 70–99)
Potassium: 3.8 mmol/L (ref 3.5–5.1)
Sodium: 140 mmol/L (ref 135–145)

## 2017-11-24 LAB — APTT: aPTT: 30 seconds (ref 24–36)

## 2017-11-24 LAB — PROTIME-INR
INR: 1
Prothrombin Time: 13.1 seconds (ref 11.4–15.2)

## 2017-11-24 LAB — TROPONIN I

## 2017-11-24 LAB — CK: CK TOTAL: 232 U/L (ref 49–397)

## 2017-11-24 MED ORDER — NITROGLYCERIN 0.4 MG SL SUBL
0.4000 mg | SUBLINGUAL_TABLET | SUBLINGUAL | Status: DC | PRN
  Administered 2017-11-24: 0.4 mg via SUBLINGUAL

## 2017-11-24 MED ORDER — ASPIRIN 81 MG PO CHEW
324.0000 mg | CHEWABLE_TABLET | Freq: Once | ORAL | Status: AC
  Administered 2017-11-24: 324 mg via ORAL

## 2017-11-24 NOTE — H&P (Signed)
[facility] [place] NAME: [name]    MR#:  [id number]  DATE OF BIRTH:  1950/05/26  DATE OF ADMISSION:  11/24/2017  PRIMARY CARE PHYSICIAN: Glean Hess, MD   REQUESTING/REFERRING PHYSICIAN:   CHIEF COMPLAINT:   Chief Complaint  Patient presents with  . Chest Pain    HISTORY OF PRESENT ILLNESS: [name]  is a 67 y.o. male with a known history of hypertension, hyperlipidemia CAD, status post previous MI in 2017. Patient presented to emergency room for chest pain, described as mild, intermittent discomfort/sharp pain over the left side of the chest, following the rib line.  His symptoms have been going on for the past week, pain seems to be worse in the morning after getting up from the bed.  No worsening with exertion, or deep breathing.  No relation to meals.  No fever or chills, no cough, no shortness of breath.  Patient states he is compliant with his medications and denies tobacco, alcohol or illicit drugs abuse. He is currently chest pain-free status post aspirin and nitroglycerin in the emergency room. Off note, patient was supposed to have his cardiology follow-up appointment next month. Blood test done in emergency room, including CBC, CMP and troponin level are normal. EKG shows normal sinus rhythm with left bundle branch block, which is an old finding.  Heart rate is 80 bpm. Chest x-ray is unremarkable. Patient is admitted for further evaluation and treatment  PAST MEDICAL HISTORY:   Past Medical History:  Diagnosis Date  . Abnormal nuclear cardiac imaging test 08/08/2015  . Arthritis   . Carotid artery occlusion   . Coronary atherosclerosis of native coronary artery 01/29/2013  . Duodenal erosion   . Encounter for screening for lung cancer 07/13/2016  . Esophageal stenosis   . GERD (gastroesophageal reflux disease)   . H. pylori infection   . Heart attack [facility]) Oct. 2009   Mild  . Hiatal hernia   .  Hyperlipidemia   . Hypertension   . 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 Cardiac catheterization-nonobstructive 60% RCA disease-subsequent nuclear stress test-9 minutes, low risk, mild inferior wall hypokinesis   . Shortness of breath dyspnea     PAST SURGICAL HISTORY:  Past Surgical History:  Procedure Laterality Date  . APPENDECTOMY    . CARDIAC CATHETERIZATION N/A 08/07/2015   Procedure: Left Heart Cath and Coronary Angiography;  Surgeon: [name], MD;  Location: [place] [facility];  Service: Cardiovascular;  Laterality: N/A;  . CARDIAC CATHETERIZATION N/A 08/07/2015   Procedure: Coronary Stent Intervention;  Surgeon: [name], MD;  Location: [place] [facility];  Service: Cardiovascular;  Laterality: N/A;  . CARDIAC CATHETERIZATION N/A 08/07/2015   Procedure: Coronary Stent Intervention;  Surgeon: [name], MD;  Location: [place] [facility];  Service: Cardiovascular;  Laterality: N/A;  . CAROTID ENDARTERECTOMY  01/05/2006   Right  CEA with DPA  . CORONARY STENT PLACEMENT  08/07/2015   MID [place]  . HIP SURGERY Left 10/2016   left hip tendon repair  . SPINE SURGERY      SOCIAL HISTORY:  Social History   Tobacco Use  . Smoking status: Former Smoker    Packs/day: 1.25    Years: 35.00    Pack years: 43.75    Types: Cigarettes    Last attempt to quit: 02/28/2005    Years since quitting: 12.7  . Smokeless  tobacco: Current User    Types: Snuff  Substance Use Topics  . Alcohol use: Yes    Alcohol/week: 8.0 - 10.0 standard drinks    Types: 8 - 10 Glasses of wine per week    FAMILY HISTORY:  Family History  Problem Relation Age of Onset  . Heart attack Mother   . Coronary artery disease Mother   . Heart disease Mother        Carotid Stenosis and BPG and Heart Disease before age 32  . Diabetes Mother   . Hypertension Mother   . Heart attack Father   . Heart disease Father        BPG and Heart  Disease before age 55  . Hypertension Father   . Cancer Father 55       throat  . Stroke Father   . Colon cancer Neg Hx   . Colon polyps Neg Hx   . Esophageal cancer Neg Hx   . Rectal cancer Neg Hx   . Stomach cancer Neg Hx     DRUG ALLERGIES:  Allergies  Allergen Reactions  . Brilinta [Ticagrelor]     Shortness of breath  . Zetia [Ezetimibe] Other (See Comments)    Muscle aches  . Statins Other (See Comments)    Failed Crestor 5 mg twice weekly, Crestor 20 mg daily, Pravastatin 40 mg qd, Lipitor, Zocor - muscle aches Failed Crestor 5 mg twice weekly, Crestor 20 mg daily, Pravastatin 40 mg qd, Lipitor, Zocor - muscle aches    REVIEW OF SYSTEMS:   CONSTITUTIONAL: No fever, fatigue or weakness.  EYES: No changes in vision.  EARS, NOSE, AND THROAT: No tinnitus or ear pain.  RESPIRATORY: No cough, shortness of breath, wheezing or hemoptysis.  CARDIOVASCULAR: Positive for chest pain; no orthopnea, edema.  GASTROINTESTINAL: No nausea, vomiting, diarrhea or abdominal pain.  GENITOURINARY: No dysuria, hematuria.  ENDOCRINE: No polyuria, nocturia. HEMATOLOGY: No bleeding. SKIN: No rash or lesion. MUSCULOSKELETAL: No joint pain at this time.   NEUROLOGIC: No focal weakness.  PSYCHIATRY: No anxiety or depression.   MEDICATIONS AT HOME:  Prior to Admission medications   Medication Sig Start Date End Date Taking? Authorizing Provider  AMBULATORY NON FORMULARY MEDICATION Take 180 mg by mouth daily. Medication Name: bempedoic acid vs placebo. CLEAR Research study drug provided 11/18/15  Yes Lelon Perla, MD  amLODipine (NORVASC) 5 MG tablet TAKE 1 TABLET BY MOUTH DAILY 07/07/17  Yes [name], MD  aspirin EC 81 MG tablet Take 1 tablet (81 mg total) by mouth daily. 09/08/15  Yes Burnell Blanks, MD  cyclobenzaprine (FLEXERIL) 10 MG tablet TAKE 1 TABLET BY MOUTH AT BEDTIME. 01/26/17  Yes Opalski, Deborah, DO  gabapentin (NEURONTIN) 100 MG capsule Take 2 capsules (200 mg  total) by mouth at bedtime. 09/20/17  Yes Opalski, Neoma Laming, DO  lisinopril (PRINIVIL,ZESTRIL) 10 MG tablet Take 1 tablet (10 mg total) by mouth daily. 01/17/17  Yes [name], MD  meloxicam (MOBIC) 7.5 MG tablet Take 2 tablets (15 mg total) by mouth daily. Take 1-2 tablets daily by mouth for severe joint pains 09/20/16  Yes Opalski, Deborah, DO  metoprolol succinate (TOPROL-XL) 25 MG 24 hr tablet TAKE 1 TABLET BY MOUTH ONCE DAILY. 05/05/17  Yes [name], MD  nitroGLYCERIN (NITROSTAT) 0.4 MG SL tablet Place 1 tablet (0.4 mg total) under the tongue every 5 (five) minutes as needed for chest pain. 08/08/15  Yes Kilroy, Luke K, PA-C  pantoprazole (PROTONIX) 40  MG tablet Take 1 tablet (40 mg total) by mouth daily. 05/18/17  Yes Ladene Artist, MD  traMADol (ULTRAM) 50 MG tablet Take 50 mg by mouth every 6 (six) hours as needed for moderate pain.  11/05/13  Yes [provider]      PHYSICAL EXAMINATION:   VITAL SIGNS: Blood pressure 108/73, pulse 69, temperature (!) 97.5 F (36.4 C), temperature source Oral, resp. rate 16, height 5\' 6"  (1.676 m), weight 79.4 kg, SpO2 92 %.  GENERAL:  67 y.o.-year-old patient lying in the bed with no acute distress.  EYES: Pupils equal, round, reactive to light and accommodation. No scleral icterus. Extraocular muscles intact.  HEENT: Head atraumatic, normocephalic. Oropharynx and nasopharynx clear.  NECK:  Supple, no jugular venous distention. No thyroid enlargement, no tenderness.  LUNGS: Normal breath sounds bilaterally, no wheezing, rales,rhonchi or crepitation. No use of accessory muscles of respiration.  CARDIOVASCULAR: S1, S2 normal. No S3/S4.  ABDOMEN: Soft, nontender, nondistended. Bowel sounds present. No organomegaly or mass.  EXTREMITIES: No pedal edema, cyanosis, or clubbing.  NEUROLOGIC: Cranial nerves II through XII are intact. Muscle strength 5/5 in all extremities. Sensation intact.   PSYCHIATRIC: The patient is alert and oriented x 3.   SKIN: No obvious rash, lesion, or ulcer.   LABORATORY PANEL:   CBC Recent Labs  Lab 11/24/17 2105  WBC 5.5  HGB 14.0  HCT 40.0  PLT 239  MCV 92.1  MCH 32.2  MCHC 35.0  RDW 13.2   ------------------------------------------------------------------------------------------------------------------  Chemistries  Recent Labs  Lab 11/24/17 2105  NA 140  K 3.8  CL 102  CO2 22  GLUCOSE 104*  BUN 24*  CREATININE 0.95  CALCIUM 9.1   ------------------------------------------------------------------------------------------------------------------ estimated creatinine clearance is 74.7 mL/min (by C-G formula based on SCr of 0.95 mg/dL). ------------------------------------------------------------------------------------------------------------------ No results for input(s): TSH, T4TOTAL, T3FREE, THYROIDAB in the last 72 hours.  Invalid input(s): FREET3   Coagulation profile Recent Labs  Lab 11/24/17 2105  INR 1.00   ------------------------------------------------------------------------------------------------------------------- No results for input(s): DDIMER in the last 72 hours. -------------------------------------------------------------------------------------------------------------------  Cardiac Enzymes Recent Labs  Lab 11/24/17 2105  TROPONINI <0.03   ------------------------------------------------------------------------------------------------------------------ Invalid input(s): POCBNP  ---------------------------------------------------------------------------------------------------------------  Urinalysis    Component Value Date/Time   COLORURINE YELLOW 12/12/2011 0914   APPEARANCEUR HAZY (A) 12/12/2011 0914   LABSPEC 1.022 12/12/2011 0914   PHURINE 8.5 (H) 12/12/2011 0914   GLUCOSEU NEGATIVE 12/12/2011 0914   HGBUR NEGATIVE 12/12/2011 0914   BILIRUBINUR negative 10/17/2016 0954   KETONESUR NEGATIVE 12/12/2011 0914   PROTEINUR negative  10/17/2016 0954   PROTEINUR NEGATIVE 12/12/2011 0914   UROBILINOGEN 0.2 10/17/2016 0954   UROBILINOGEN 1.0 12/12/2011 0914   NITRITE negative 10/17/2016 0954   NITRITE NEGATIVE 12/12/2011 0914   LEUKOCYTESUR Negative 10/17/2016 0954     RADIOLOGY: Dg Chest 2 View  Result Date: 11/24/2017 CLINICAL DATA:  67 year old male with chest pain. EXAM: CHEST - 2 VIEW COMPARISON:  Chest radiograph dated 09/19/2017 FINDINGS: There are bibasilar atelectatic changes/scarring. No focal consolidation, pleural effusion, or pneumothorax. The cardiac silhouette is within normal limits. No acute osseous pathology. IMPRESSION: No active cardiopulmonary disease. Electronically Signed   By: Anner Crete M.D.   On: 11/24/2017 21:27    EKG: Orders placed or performed during the hospital encounter of 11/24/17  . EKG 12-Lead  . EKG 12-Lead  . ED EKG within 10 minutes  . ED EKG within 10 minutes    IMPRESSION AND PLAN:  1.  Chest pain, likely musculoskeletal in  nature.  However, given the patient's cardiac history, will rule out acute coronary syndrome.  Continue to monitor on telemetry, follow troponin levels, check 2D echo, check stress test.  Cardiology is consulted for further evaluation and treatment. 2.  CAD, status post previous MI, status post stent placement.  Continue medical treatment. 3.  Hypertension, stable, continue home medications. 4.  History of hyperlipidemia, will check fasting lipid panel.   All the records are reviewed and case discussed with ED provider. Management plans discussed with the patient, family and they are in agreement.  CODE STATUS: Full    TOTAL TIME TAKING CARE OF THIS PATIENT: 50 minutes.    Amelia Jo M.D on 11/24/2017 at 11:42 PM  Between 7am to 6pm - Pager - [phone]  After 6pm go to [email or web address] - password [email or web address] [facility] [place] [facility]  [phone]  CC: Primary care physician; Glean Hess, MD

## 2017-11-24 NOTE — ED Provider Notes (Signed)
[facility] Emergency Department Provider Note   ____________________________________________   First MD Initiated Contact with Patient 11/24/17 2112     (approximate)  I have reviewed the triage vital signs and the nursing notes.   HISTORY  Chief Complaint Chest Pain    HPI [name] is a 67 y.o. male history of known coronary disease previous MI  Patient reports for about 1 week now is been experiencing some intermittent discomfort pressure-like feeling over the left side of the chest.  Comes and goes, played golf today and reports it seemed a little more prominent this evening than it has over the last week or so where it comes and goes last sometimes an hour or 2.  Is compliant with his medications.  Denies pleuritic pain.  No fevers or chills no cough no shortness of breath.  Reports that set concerning pain to him and that it seems to be no better and may be getting a little bit worse and is beginning to worry him it could be a heart problem  After taking aspirin and a nitroglycerin tablet at urgent care he reports his pain is much better and is about a 1 out of 10 and really not very bothersome at all right now but was worse earlier.   Past Medical History:  Diagnosis Date  . Abnormal nuclear cardiac imaging test 08/08/2015  . Arthritis   . Carotid artery occlusion   . Coronary atherosclerosis of native coronary artery 01/29/2013  . Duodenal erosion   . Encounter for screening for lung cancer 07/13/2016  . Esophageal stenosis   . GERD (gastroesophageal reflux disease)   . H. pylori infection   . Heart attack [facility]) Oct. 2009   Mild  . Hiatal hernia   . Hyperlipidemia   . Hypertension   . 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 Cardiac catheterization-nonobstructive 60% RCA disease-subsequent nuclear stress test-9 minutes, low risk, mild inferior wall hypokinesis   . Shortness of breath  dyspnea     Patient Active Problem List   Diagnosis Date Noted  . Chest pain 11/24/2017  . 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 Foot pain, bilateral- chronic many yrs 05/01/2017  . Disorder of tendon 03/01/2017  . Left-sided thoracic back pain 10/17/2016  . Tobacco use disorder, moderate, in sustained remission 07/13/2016  . Overweight (BMI 25.0-29.9) 07/13/2016  . 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 Dysphagia 11/26/2015  . Chronic anticoagulation: plavix 10/15/2015  . Chronic left hip pain 09/15/2015  . Greater trochanteric bursitis of left hip 09/15/2015  . Coronary atherosclerosis of native coronary artery 08/08/2015  . Essential hypertension 08/08/2015  . Hyperlipidemia 01/29/2013  . Carotid stenosis-s/p RCE 11/08/2011    Past Surgical History:  Procedure Laterality Date  . APPENDECTOMY    . CARDIAC CATHETERIZATION N/A 08/07/2015   Procedure: Left Heart Cath and Coronary Angiography;  Surgeon: [name], MD;  Location: [place] [facility];  Service: Cardiovascular;  Laterality: N/A;  . CARDIAC CATHETERIZATION N/A 08/07/2015   Procedure: Coronary Stent Intervention;  Surgeon: [name], MD;  Location: [place] [facility];  Service: Cardiovascular;  Laterality: N/A;  . CARDIAC CATHETERIZATION N/A 08/07/2015   Procedure: Coronary Stent Intervention;  Surgeon: [name], MD;  Location: [place] [facility];  Service: Cardiovascular;  Laterality: N/A;  . CAROTID ENDARTERECTOMY  01/05/2006   Right  CEA with DPA  . CORONARY STENT  PLACEMENT  08/07/2015   MID CIRCUMFLEX  . HIP SURGERY Left 10/2016   left hip tendon repair  . SPINE SURGERY      Prior to Admission medications   Medication Sig Start Date End Date Taking? Authorizing Provider  AMBULATORY NON FORMULARY MEDICATION Take 180 mg by mouth daily. Medication Name: bempedoic acid vs placebo. CLEAR Research study drug provided 11/18/15  Yes Lelon Perla, MD  amLODipine  (NORVASC) 5 MG tablet TAKE 1 TABLET BY MOUTH DAILY 07/07/17  Yes [name], MD  aspirin EC 81 MG tablet Take 1 tablet (81 mg total) by mouth daily. 09/08/15  Yes Burnell Blanks, MD  cyclobenzaprine (FLEXERIL) 10 MG tablet TAKE 1 TABLET BY MOUTH AT BEDTIME. 01/26/17  Yes Opalski, Deborah, DO  gabapentin (NEURONTIN) 100 MG capsule Take 2 capsules (200 mg total) by mouth at bedtime. 09/20/17  Yes Opalski, Neoma Laming, DO  lisinopril (PRINIVIL,ZESTRIL) 10 MG tablet Take 1 tablet (10 mg total) by mouth daily. 01/17/17  Yes [name], MD  meloxicam (MOBIC) 7.5 MG tablet Take 2 tablets (15 mg total) by mouth daily. Take 1-2 tablets daily by mouth for severe joint pains 09/20/16  Yes Opalski, Deborah, DO  metoprolol succinate (TOPROL-XL) 25 MG 24 hr tablet TAKE 1 TABLET BY MOUTH ONCE DAILY. 05/05/17  Yes [name], MD  nitroGLYCERIN (NITROSTAT) 0.4 MG SL tablet Place 1 tablet (0.4 mg total) under the tongue every 5 (five) minutes as needed for chest pain. 08/08/15  Yes Kilroy, Luke K, PA-C  pantoprazole (PROTONIX) 40 MG tablet Take 1 tablet (40 mg total) by mouth daily. 05/18/17  Yes Ladene Artist, MD  traMADol (ULTRAM) 50 MG tablet Take 50 mg by mouth every 6 (six) hours as needed for moderate pain.  11/05/13  Yes [provider]    Allergies Brilinta [ticagrelor]; Zetia [ezetimibe]; and Statins  Family History  Problem Relation Age of Onset  . Heart attack Mother   . Coronary artery disease Mother   . Heart disease Mother        Carotid Stenosis and BPG and Heart Disease before age 73  . Diabetes Mother   . Hypertension Mother   . Heart attack Father   . Heart disease Father        BPG and Heart Disease before age 42  . Hypertension Father   . Cancer Father 55       throat  . Stroke Father   . Colon cancer Neg Hx   . Colon polyps Neg Hx   . Esophageal cancer Neg Hx   . Rectal cancer Neg Hx   . Stomach cancer Neg Hx     Social History Social History   Tobacco  Use  . Smoking status: Former Smoker    Packs/day: 1.25    Years: 35.00    Pack years: 43.75    Types: Cigarettes    Last attempt to quit: 02/28/2005    Years since quitting: 12.7  . Smokeless tobacco: Current User    Types: Snuff  Substance Use Topics  . Alcohol use: Yes    Alcohol/week: 8.0 - 10.0 standard drinks    Types: 8 - 10 Glasses of wine per week  . Drug use: No    Review of Systems Constitutional: No fever/chills Eyes: No visual changes. ENT: No sore throat. Cardiovascular: See HPI respiratory: Denies shortness of breath. Gastrointestinal: No abdominal pain.   Genitourinary: Negative for dysuria. Musculoskeletal: Negative for back pain.  No  [place] trips or travel.  No leg swelling.  Denies history of blood clots. Skin: Negative for rash. Neurological: Negative for headaches, areas of focal weakness or numbness.    ____________________________________________   PHYSICAL EXAM:  VITAL SIGNS: ED Triage Vitals  Enc Vitals Group     BP 11/24/17 2100 112/80     Pulse Rate 11/24/17 2100 77     Resp 11/24/17 2100 18     Temp 11/24/17 2105 (!) 97.5 F (36.4 C)     Temp Source 11/24/17 2105 Oral     SpO2 11/24/17 2100 91 %     Weight 11/24/17 2106 175 lb (79.4 kg)     Height 11/24/17 2106 '5\' 6"'$  (1.676 m)     Head Circumference --      Peak Flow --      Pain Score 11/24/17 2106 0     Pain Loc --      Pain Edu? --      Excl. in [place]? --     Constitutional: Alert and oriented. Well appearing and in no acute distress. Eyes: Conjunctivae are normal. Head: Atraumatic. Nose: No congestion/rhinnorhea. Mouth/Throat: Mucous membranes are moist. Neck: No stridor.  Cardiovascular: Normal rate, regular rhythm. Grossly normal heart sounds.  Good peripheral circulation. Respiratory: Normal respiratory effort.  No retractions. Lungs CTAB. Gastrointestinal: Soft and nontender. No distention. Musculoskeletal: No lower extremity tenderness nor edema.  No thigh or calf swelling  or tenderness. Neurologic:  Normal speech and language. No gross focal neurologic deficits are appreciated.  Skin:  Skin is warm, dry and intact. No rash noted. Psychiatric: Mood and affect are normal. Speech and behavior are normal.  ____________________________________________   LABS (all labs ordered are listed, but only abnormal results are displayed)  Labs Reviewed  BASIC METABOLIC PANEL - Abnormal; Notable for the following components:      Result Value   Glucose, Bld 104 (*)    BUN 24 (*)    Anion gap 16 (*)    All other components within normal limits  CBC - Abnormal; Notable for the following components:   RBC 4.35 (*)    All other components within normal limits  TROPONIN I  PROTIME-INR  APTT  CK   ____________________________________________  EKG  Reviewed and entered by me at 2100 Heart rate 80 Cures 140 QTc 470 Normal sinus rhythm with a left bundle branch block.  No scar Bosa criteria are met.  Patient has a known history of left bundle, ____________________________________________  RADIOLOGY  Chest x-ray reviewed negative for acute ____________________________________________   PROCEDURES  Procedure(s) performed: None  Procedures  Critical Care performed: No  ____________________________________________   INITIAL IMPRESSION / ASSESSMENT AND PLAN / ED COURSE  Pertinent labs & imaging results that were available during my care of the patient were reviewed by me and considered in my medical decision making (see chart for details).   Differential diagnosis includes, but is not limited to, ACS, aortic dissection, pulmonary embolism, cardiac tamponade, pneumothorax, pneumonia, pericarditis, myocarditis, GI-related causes including esophagitis/gastritis, and musculoskeletal chest wall pain.    Given this presentation I am most concerned about the possibility of coronary disease.  No notable risk factors or symptoms suggest dissection, pulmonary  embolism, pneumonia pericarditis myocarditis or gastrointestinal illness.  Very reassuring clinical exam at this time but given his history certainly await troponin testing.  Pain is much better after nitrates and aspirin continue to monitor closely in the ER.  Patient did not wish for any additional medications  at this time       ____________________________________________   FINAL CLINICAL IMPRESSION(S) / ED DIAGNOSES  Final diagnoses:  Moderate risk chest pain        Note:  This document was prepared using Dragon voice recognition software and may include unintentional dictation errors       Delman Kitten, MD 11/25/17 0008

## 2017-11-24 NOTE — ED Triage Notes (Signed)
Pt presents via [facility] with c/o of substernal non-radiating left sided chest pain that has been going on for about 2 weeks. Pt has a hx of MI in 2009 with stents placed. Pt states he has more chest pain with inspiration. Chest pain starts in the morning when pt wakes up and has been ongoing for the 2 weeks. Pt denies any dyspnea, n/v with chest pain. Pt was given 1 nitro which brought pain down from 5 to 3. Pt given 324 of aspirin. 20G in RAC. NSR with unifocal PVCs on monitor for EMS.

## 2017-11-24 NOTE — ED Triage Notes (Addendum)
Patient complains of chest pain mid to left side of chest x 1 week, worsening today. Patient states that he has had a previous heart attack and stents. Patient states that the chest pain has been worse in the morning and improved as day went on. Patient reports that today pain has remained constant.

## 2017-11-24 NOTE — ED Provider Notes (Signed)
HPI  SUBJECTIVE:  [name] is a 67 y.o. male who presents with left-sided/substernal sided chest pain for the past week.  Patient is unable to characterize the quality or nature of the pain, but states that it has been going on for 2 to 3 hours each morning.  States that it usually resolves and does not return until the next day.  He states that it restarted 3 hours ago and it has been constant since.  He denies nausea, vomiting, diaphoresis, radiation up his neck, down his arm or through to his back.  It does not seem to have an exertional or positional component.  He states that he played golf today without any chest pain.  No change in his physical activity, trauma to the chest.  No palpitations, shortness of breath.  No presyncope, syncope, abdominal pain.  No belching, water brash.  No fevers, coughing, wheezing.  No aggravating or alleviating factors.  He has not tried anything for this.  He has a past medical history of MI, coronary artery disease with 99% occlusion of the circumflex status post stent placed in June 2017, hypercholesterolemia, hypertension, peripheral vascular disease/carotid artery occlusion.  No history of diabetes, HIV, PE, DVT.  He is a former smoker.  Took 81 mg of aspirin this morning.  PMD: Dr. Army Melia.  Past Medical History:  Diagnosis Date  . Abnormal nuclear cardiac imaging test 08/08/2015  . Arthritis   . Carotid artery occlusion   . Coronary atherosclerosis of native coronary artery 01/29/2013  . Duodenal erosion   . Encounter for screening for lung cancer 07/13/2016  . Esophageal stenosis   . GERD (gastroesophageal reflux disease)   . H. pylori infection   . Heart attack [facility]) Oct. 2009   Mild  . Hiatal hernia   . Hyperlipidemia   . Hypertension   . 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 Cardiac catheterization-nonobstructive 60% RCA disease-subsequent nuclear stress test-9 minutes, low risk, mild  inferior wall hypokinesis   . Shortness of breath dyspnea     Past Surgical History:  Procedure Laterality Date  . APPENDECTOMY    . CARDIAC CATHETERIZATION N/A 08/07/2015   Procedure: Left Heart Cath and Coronary Angiography;  Surgeon: [name], MD;  Location: [place] [facility];  Service: Cardiovascular;  Laterality: N/A;  . CARDIAC CATHETERIZATION N/A 08/07/2015   Procedure: Coronary Stent Intervention;  Surgeon: [name], MD;  Location: [place] [facility];  Service: Cardiovascular;  Laterality: N/A;  . CARDIAC CATHETERIZATION N/A 08/07/2015   Procedure: Coronary Stent Intervention;  Surgeon: [name], MD;  Location: [place] [facility];  Service: Cardiovascular;  Laterality: N/A;  . CAROTID ENDARTERECTOMY  01/05/2006   Right  CEA with DPA  . CORONARY STENT PLACEMENT  08/07/2015   [facility] [place]  . HIP SURGERY Left 10/2016   left hip tendon repair  . SPINE SURGERY      Family History  Problem Relation Age of Onset  . Heart attack Mother   . Coronary artery disease Mother   . Heart disease Mother        Carotid Stenosis and BPG and Heart Disease before age 34  . Diabetes Mother   . Hypertension Mother   . Heart attack Father   . Heart disease Father        BPG and Heart Disease before age 32  . Hypertension Father   . Cancer Father 66  throat  . Stroke Father   . Colon cancer Neg Hx   . Colon polyps Neg Hx   . Esophageal cancer Neg Hx   . Rectal cancer Neg Hx   . Stomach cancer Neg Hx     Social History   Tobacco Use  . Smoking status: Former Smoker    Packs/day: 1.25    Years: 35.00    Pack years: 43.75    Types: Cigarettes    Last attempt to quit: 02/28/2005    Years since quitting: 12.7  . Smokeless tobacco: Current User    Types: Snuff  Substance Use Topics  . Alcohol use: Yes    Alcohol/week: 8.0 - 10.0 standard drinks    Types: 8 - 10 Glasses of wine per week  . Drug use: No     Current Facility-Administered Medications:  .  0.9 %   sodium chloride infusion, 500 mL, Intravenous, Continuous, Lucio Edward T, MD .  0.9 %  sodium chloride infusion, 500 mL, Intravenous, Continuous, Lucio Edward T, MD .  0.9 %  sodium chloride infusion, 500 mL, Intravenous, Once, Lucio Edward T, MD .  nitroGLYCERIN (NITROSTAT) SL tablet 0.4 mg, 0.4 mg, Sublingual, Q5 min PRN, Melynda Ripple, MD, 0.4 mg at 11/24/17 2015  Current Outpatient Medications:  .  AMBULATORY NON FORMULARY MEDICATION, Take 180 mg by mouth daily. Medication Name: bempedoic acid vs placebo. CLEAR Research study drug provided, Disp: , Rfl:  .  amLODipine (NORVASC) 5 MG tablet, TAKE 1 TABLET BY MOUTH DAILY, Disp: 90 tablet, Rfl: 0 .  aspirin EC 81 MG tablet, Take 1 tablet (81 mg total) by mouth daily., Disp: 90 tablet, Rfl: 3 .  cyclobenzaprine (FLEXERIL) 10 MG tablet, TAKE 1 TABLET BY MOUTH AT BEDTIME., Disp: 30 tablet, Rfl: 0 .  gabapentin (NEURONTIN) 100 MG capsule, Take 2 capsules (200 mg total) by mouth at bedtime., Disp: 180 capsule, Rfl: 1 .  lisinopril (PRINIVIL,ZESTRIL) 10 MG tablet, Take 1 tablet (10 mg total) by mouth daily., Disp: 90 tablet, Rfl: 3 .  meloxicam (MOBIC) 7.5 MG tablet, Take 2 tablets (15 mg total) by mouth daily. Take 1-2 tablets daily by mouth for severe joint pains, Disp: 180 tablet, Rfl: 0 .  metoprolol succinate (TOPROL-XL) 25 MG 24 hr tablet, TAKE 1 TABLET BY MOUTH ONCE DAILY., Disp: 90 tablet, Rfl: 2 .  nitroGLYCERIN (NITROSTAT) 0.4 MG SL tablet, Place 1 tablet (0.4 mg total) under the tongue every 5 (five) minutes as needed for chest pain., Disp: 25 tablet, Rfl: 2 .  pantoprazole (PROTONIX) 40 MG tablet, Take 1 tablet (40 mg total) by mouth daily., Disp: 90 tablet, Rfl: 3 .  traMADol (ULTRAM) 50 MG tablet, Take 50 mg by mouth every 6 (six) hours as needed for moderate pain. , Disp: , Rfl:   Allergies  Allergen Reactions  . Brilinta [Ticagrelor]     Shortness of breath  . Zetia [Ezetimibe] Other (See Comments)    Muscle aches  .  Statins Other (See Comments)    Failed Crestor 5 mg twice weekly, Crestor 20 mg daily, Pravastatin 40 mg qd, Lipitor, Zocor - muscle aches Failed Crestor 5 mg twice weekly, Crestor 20 mg daily, Pravastatin 40 mg qd, Lipitor, Zocor - muscle aches     ROS  As noted in HPI.   Physical Exam  BP 128/82 (BP Location: Left Arm)   Pulse 75   Temp 97.9 F (36.6 C) (Oral)   Resp 18   Ht 5\' 6"  (1.676  m)   Wt 79.8 kg   SpO2 96%   BMI 28.41 kg/m   Constitutional: Well developed, well nourished, no acute distress Eyes:  EOMI, conjunctiva normal bilaterally HENT: Normocephalic, atraumatic,mucus membranes moist Respiratory: Normal inspiratory effort, lungs clear bilaterally. Cardiovascular: Normal rate, regular rhythm, no murmurs, rubs, gallops.  No chest wall tenderness.  Pain is not elicited with torso rotation. GI: nondistended soft, nontender, no guarding, rebound.  Active bowel sounds.   Skin: No rash, skin intact Musculoskeletal: no deformities Neurologic: Alert & oriented x 3, no focal neuro deficits Psychiatric: Speech and behavior appropriate   ED Course   Medications  nitroGLYCERIN (NITROSTAT) SL tablet 0.4 mg (0.4 mg Sublingual Given 11/24/17 2015)  aspirin chewable tablet 324 mg (324 mg Oral Given 11/24/17 2020)    Orders Placed This Encounter  Procedures  . ED EKG    Standing Status:   Standing    Number of Occurrences:   1    Order Specific Question:   Reason for Exam    Answer:   Chest Pain  . EKG 12-Lead    Standing Status:   Standing    Number of Occurrences:   1    No results found for this or any previous visit (from the past 24 hour(s)). No results found.  ED Clinical Impression  Chest pain, unspecified type   ED Assessment/Plan   EKG: Normal sinus rhythm, left bundle branch block.  Normal axis.  No changes from EKG obtained 12/2016.  Patient was symptomatic while the EKG was obtained, however he has a left bundle branch block which was present on  a previous EKG.  Concern for cardiac etiology of his pain.  Gave patient 324 mg of aspirin and 0.4 mg of nitroglycerin sublingually with improvement in his symptoms.  Will transfer to [facility] via EMS for further work-up.  Discussed medical decision making, rationale for transfer to the emergency department with patient and spouse.  They agree with plan. Discussed with charge RN.   Meds ordered this encounter  Medications  . nitroGLYCERIN (NITROSTAT) SL tablet 0.4 mg  . aspirin chewable tablet 324 mg    *This clinic note was created using [employer]. Therefore, there may be occasional mistakes despite careful proofreading.   ?   Melynda Ripple, MD 11/24/17 2033

## 2017-11-25 ENCOUNTER — Encounter: Payer: Self-pay | Admitting: Internal Medicine

## 2017-11-25 ENCOUNTER — Observation Stay (HOSPITAL_BASED_OUTPATIENT_CLINIC_OR_DEPARTMENT_OTHER)
Admit: 2017-11-25 | Discharge: 2017-11-25 | Disposition: A | Discharge status: Home / Self Care | Payer: PPO | Attending: Internal Medicine | Admitting: Internal Medicine

## 2017-11-25 ENCOUNTER — Observation Stay (HOSPITAL_BASED_OUTPATIENT_CLINIC_OR_DEPARTMENT_OTHER): Payer: PPO

## 2017-11-25 DIAGNOSIS — I340 Nonrheumatic mitral (valve) insufficiency: Secondary | ICD-10-CM

## 2017-11-25 DIAGNOSIS — I10 Essential (primary) hypertension: Secondary | ICD-10-CM | POA: Diagnosis present

## 2017-11-25 DIAGNOSIS — I25119 Atherosclerotic heart disease of native coronary artery with unspecified angina pectoris: Secondary | ICD-10-CM | POA: Diagnosis not present

## 2017-11-25 DIAGNOSIS — Z87891 Personal history of nicotine dependence: Secondary | ICD-10-CM | POA: Diagnosis not present

## 2017-11-25 DIAGNOSIS — I739 Peripheral vascular disease, unspecified: Secondary | ICD-10-CM | POA: Diagnosis present

## 2017-11-25 DIAGNOSIS — I252 Old myocardial infarction: Secondary | ICD-10-CM | POA: Diagnosis not present

## 2017-11-25 DIAGNOSIS — I2510 Atherosclerotic heart disease of native coronary artery without angina pectoris: Secondary | ICD-10-CM | POA: Diagnosis present

## 2017-11-25 DIAGNOSIS — Z79899 Other long term (current) drug therapy: Secondary | ICD-10-CM | POA: Diagnosis not present

## 2017-11-25 DIAGNOSIS — Z8249 Family history of ischemic heart disease and other diseases of the circulatory system: Secondary | ICD-10-CM | POA: Diagnosis not present

## 2017-11-25 DIAGNOSIS — Z7982 Long term (current) use of aspirin: Secondary | ICD-10-CM | POA: Diagnosis not present

## 2017-11-25 DIAGNOSIS — I200 Unstable angina: Secondary | ICD-10-CM | POA: Diagnosis present

## 2017-11-25 DIAGNOSIS — R0789 Other chest pain: Secondary | ICD-10-CM | POA: Diagnosis present

## 2017-11-25 DIAGNOSIS — I208 Other forms of angina pectoris: Secondary | ICD-10-CM | POA: Diagnosis not present

## 2017-11-25 DIAGNOSIS — E785 Hyperlipidemia, unspecified: Secondary | ICD-10-CM

## 2017-11-25 DIAGNOSIS — I429 Cardiomyopathy, unspecified: Secondary | ICD-10-CM | POA: Diagnosis present

## 2017-11-25 DIAGNOSIS — K219 Gastro-esophageal reflux disease without esophagitis: Secondary | ICD-10-CM | POA: Diagnosis present

## 2017-11-25 DIAGNOSIS — E7800 Pure hypercholesterolemia, unspecified: Secondary | ICD-10-CM | POA: Diagnosis present

## 2017-11-25 DIAGNOSIS — R079 Chest pain, unspecified: Secondary | ICD-10-CM | POA: Diagnosis present

## 2017-11-25 DIAGNOSIS — Z955 Presence of coronary angioplasty implant and graft: Secondary | ICD-10-CM | POA: Diagnosis not present

## 2017-11-25 HISTORY — DX: Unstable angina: I20.0

## 2017-11-25 LAB — LIPID PANEL
CHOLESTEROL: 179 mg/dL (ref 0–200)
HDL: 43 mg/dL (ref >40)
LDL CALC: 106 mg/dL — AB (ref 0–99)
TRIGLYCERIDES: 150 mg/dL — AB (ref <150)
Total CHOL/HDL Ratio: 4.2 RATIO
VLDL: 30 mg/dL (ref 0–40)

## 2017-11-25 LAB — GLUCOSE, CAPILLARY: GLUCOSE-CAPILLARY: 91 mg/dL (ref 70–99)

## 2017-11-25 LAB — CBC
HEMATOCRIT: 40.5 % (ref 40.0–52.0)
HEMOGLOBIN: 13.9 g/dL (ref 13.0–18.0)
MCH: 31.6 pg (ref 26.0–34.0)
MCHC: 34.3 g/dL (ref 32.0–36.0)
MCV: 92.2 fL (ref 80.0–100.0)
PLATELETS: 235 10*3/uL (ref 150–440)
RBC: 4.39 MIL/uL — AB (ref 4.40–5.90)
RDW: 13.4 % (ref 11.5–14.5)
WBC: 6.1 10*3/uL (ref 3.8–10.6)

## 2017-11-25 LAB — ECHOCARDIOGRAM COMPLETE
HEIGHTINCHES: 66 in
WEIGHTICAEL: 2777.6 [oz_av]

## 2017-11-25 LAB — NM MYOCAR MULTI W/SPECT W/WALL MOTION / EF
CHL CUP RESTING HR STRESS: 71 {beats}/min
LVDIAVOL: 184 mL (ref 62–150)
LVSYSVOL: 120 mL
Peak HR: 100 {beats}/min
TID: 1.02

## 2017-11-25 LAB — BASIC METABOLIC PANEL
Anion gap: 12 (ref 5–15)
BUN: 23 mg/dL (ref 8–23)
CHLORIDE: 106 mmol/L (ref 98–111)
CO2: 25 mmol/L (ref 22–32)
Calcium: 8.7 mg/dL — ABNORMAL LOW (ref 8.9–10.3)
Creatinine, Ser: 0.77 mg/dL (ref 0.61–1.24)
GFR calc Af Amer: >60 mL/min (ref >60)
GFR calc non Af Amer: >60 mL/min (ref >60)
Glucose, Bld: 99 mg/dL (ref 70–99)
POTASSIUM: 4.1 mmol/L (ref 3.5–5.1)
SODIUM: 143 mmol/L (ref 135–145)

## 2017-11-25 LAB — TROPONIN I: Troponin I: <0.03 ng/mL (ref <0.03)

## 2017-11-25 MED ORDER — METOPROLOL SUCCINATE ER 25 MG PO TB24
25.0000 mg | ORAL_TABLET | Freq: Every day | ORAL | Status: DC
  Administered 2017-11-25 – 2017-11-27 (×3): 25 mg via ORAL
  Filled 2017-11-25 (×3): qty 1

## 2017-11-25 MED ORDER — SODIUM CHLORIDE 0.9 % IV SOLN
INTRAVENOUS | Status: DC
  Administered 2017-11-25: 04:00:00 via INTRAVENOUS

## 2017-11-25 MED ORDER — REGADENOSON 0.4 MG/5ML IV SOLN
0.4000 mg | Freq: Once | INTRAVENOUS | Status: AC
  Administered 2017-11-25: 0.4 mg via INTRAVENOUS

## 2017-11-25 MED ORDER — TECHNETIUM TC 99M TETROFOSMIN IV KIT
30.5000 | PACK | Freq: Once | INTRAVENOUS | Status: AC | PRN
  Administered 2017-11-25: 30.5 via INTRAVENOUS

## 2017-11-25 MED ORDER — TRAZODONE HCL 50 MG PO TABS
25.0000 mg | ORAL_TABLET | Freq: Every evening | ORAL | Status: DC | PRN
  Administered 2017-11-25: 25 mg via ORAL
  Filled 2017-11-25: qty 1

## 2017-11-25 MED ORDER — NITROGLYCERIN 0.4 MG SL SUBL
0.4000 mg | SUBLINGUAL_TABLET | SUBLINGUAL | Status: AC | PRN
  Administered 2017-11-25 (×3): 0.4 mg via SUBLINGUAL
  Filled 2017-11-25: qty 1

## 2017-11-25 MED ORDER — SODIUM CHLORIDE 0.9 % IV SOLN: Freq: Once | INTRAVENOUS | Status: DC

## 2017-11-25 MED ORDER — PANTOPRAZOLE SODIUM 40 MG PO TBEC
40.0000 mg | DELAYED_RELEASE_TABLET | Freq: Every day | ORAL | Status: DC
  Administered 2017-11-25 – 2017-11-27 (×3): 40 mg via ORAL
  Filled 2017-11-25 (×3): qty 1

## 2017-11-25 MED ORDER — ISOSORBIDE MONONITRATE ER 30 MG PO TB24
30.0000 mg | ORAL_TABLET | Freq: Every day | ORAL | Status: DC
  Administered 2017-11-25 – 2017-11-26 (×2): 30 mg via ORAL
  Filled 2017-11-25 (×2): qty 1

## 2017-11-25 MED ORDER — ONDANSETRON HCL 4 MG PO TABS: 4.0000 mg | ORAL_TABLET | Freq: Four times a day (QID) | ORAL | Status: DC | PRN

## 2017-11-25 MED ORDER — HYDROCODONE-ACETAMINOPHEN 5-325 MG PO TABS: 1.0000 | ORAL_TABLET | ORAL | Status: DC | PRN

## 2017-11-25 MED ORDER — BISACODYL 5 MG PO TBEC: 5.0000 mg | DELAYED_RELEASE_TABLET | Freq: Every day | ORAL | Status: DC | PRN

## 2017-11-25 MED ORDER — LISINOPRIL 10 MG PO TABS
10.0000 mg | ORAL_TABLET | Freq: Every day | ORAL | Status: DC
  Administered 2017-11-25 – 2017-11-27 (×3): 10 mg via ORAL
  Filled 2017-11-25 (×3): qty 1

## 2017-11-25 MED ORDER — ASPIRIN EC 81 MG PO TBEC
81.0000 mg | DELAYED_RELEASE_TABLET | Freq: Every day | ORAL | Status: DC
  Administered 2017-11-25 – 2017-11-26 (×2): 81 mg via ORAL
  Filled 2017-11-25 (×3): qty 1

## 2017-11-25 MED ORDER — DOCUSATE SODIUM 100 MG PO CAPS
100.0000 mg | ORAL_CAPSULE | Freq: Two times a day (BID) | ORAL | Status: DC
  Administered 2017-11-25 – 2017-11-27 (×5): 100 mg via ORAL
  Filled 2017-11-25 (×6): qty 1

## 2017-11-25 MED ORDER — GABAPENTIN 100 MG PO CAPS
200.0000 mg | ORAL_CAPSULE | Freq: Every day | ORAL | Status: DC
  Administered 2017-11-25 – 2017-11-26 (×2): 200 mg via ORAL
  Filled 2017-11-25 (×2): qty 2

## 2017-11-25 MED ORDER — ACETAMINOPHEN 650 MG RE SUPP: 650.0000 mg | Freq: Four times a day (QID) | RECTAL | Status: DC | PRN

## 2017-11-25 MED ORDER — ONDANSETRON HCL 4 MG/2ML IJ SOLN: 4.0000 mg | Freq: Four times a day (QID) | INTRAMUSCULAR | Status: DC | PRN

## 2017-11-25 MED ORDER — TECHNETIUM TC 99M TETROFOSMIN IV KIT
11.0400 | PACK | Freq: Once | INTRAVENOUS | Status: AC | PRN
  Administered 2017-11-25: 11.04 via INTRAVENOUS

## 2017-11-25 MED ORDER — ACETAMINOPHEN 325 MG PO TABS
650.0000 mg | ORAL_TABLET | Freq: Four times a day (QID) | ORAL | Status: DC | PRN
  Administered 2017-11-26: 650 mg via ORAL
  Filled 2017-11-25 (×2): qty 2

## 2017-11-25 MED ORDER — MORPHINE SULFATE (PF) 2 MG/ML IV SOLN
2.0000 mg | INTRAVENOUS | Status: DC | PRN
  Administered 2017-11-25: 2 mg via INTRAVENOUS
  Filled 2017-11-25: qty 1

## 2017-11-25 MED ORDER — AMLODIPINE BESYLATE 5 MG PO TABS
5.0000 mg | ORAL_TABLET | Freq: Every day | ORAL | Status: DC
  Administered 2017-11-25 – 2017-11-27 (×3): 5 mg via ORAL
  Filled 2017-11-25 (×3): qty 1

## 2017-11-25 MED ORDER — NITROGLYCERIN 0.4 MG SL SUBL: 0.4000 mg | SUBLINGUAL_TABLET | SUBLINGUAL | Status: DC | PRN

## 2017-11-25 MED ORDER — TRAMADOL HCL 50 MG PO TABS: 50.0000 mg | ORAL_TABLET | Freq: Four times a day (QID) | ORAL | Status: DC | PRN

## 2017-11-25 MED ORDER — HEPARIN SODIUM (PORCINE) 5000 UNIT/ML IJ SOLN
5000.0000 [IU] | Freq: Three times a day (TID) | INTRAMUSCULAR | Status: DC
  Administered 2017-11-25 – 2017-11-27 (×5): 5000 [IU] via SUBCUTANEOUS
  Filled 2017-11-25 (×5): qty 1

## 2017-11-25 MED ORDER — NITROGLYCERIN 2 % TD OINT
1.0000 [in_us] | TOPICAL_OINTMENT | Freq: Once | TRANSDERMAL | Status: AC
  Administered 2017-11-25: 1 [in_us] via TOPICAL
  Filled 2017-11-25: qty 1

## 2017-11-25 NOTE — Consult Note (Addendum)
Cardiology Consultation:   Patient ID: [name] MRN: [id number]; DOB: Apr 06, 1950  Admit date: 11/24/2017 Date of Consult: 11/25/2017  Primary Care Provider: Glean Hess, MD Primary Cardiologist: Candee Furbish, MD Primary Electrophysiologist:  None    Patient Profile:   [name] is a 67 y.o. male with a hx of coronary artery disease s/p PCI to mid LCx in 07/2015 in the setting of NSTEMI, hypertension, and hyperlipidemia who is being seen today for the evaluation of chest pain at the request of Dr. Bridgett Larsson.  History of Present Illness:   [name] reports a 10-14 day history of sharp, left-sided chest pain without radiation.  The pain is most often present when he wakes up in the morning and improves after getting up and moving around.  The pain is not exertional nor are there any associated symptoms like shortness of breath, palpitations, nausea, and diaphoresis.  He also notes that the pain is different than what he experienced at the time of his MI in 2017.  Due to continued pain, he presented to the ED yesterday for further evaluation.  Troponin has been negative x 3.  However, due to continued intermittent pain (worse when lying down or taking a deep breath), he was admitted.  Currently, [name] notes intermittent "nagging" pain along the left chest.  He attributes this to having been in bed since yesterday evening.  Past Medical History:  Diagnosis Date  . Abnormal nuclear cardiac imaging test 08/08/2015  . Arthritis   . Carotid artery occlusion   . Coronary atherosclerosis of native coronary artery 01/29/2013  . Duodenal erosion   . Encounter for screening for lung cancer 07/13/2016  . Esophageal stenosis   . GERD (gastroesophageal reflux disease)   . H. pylori infection   . Heart attack [facility]) Oct. 2009   Mild  . Hiatal hernia   . Hyperlipidemia   . Hypertension   . 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 Cardiac  catheterization-nonobstructive 60% RCA disease-subsequent nuclear stress test-9 minutes, low risk, mild inferior wall hypokinesis   . Shortness of breath dyspnea     Past Surgical History:  Procedure Laterality Date  . APPENDECTOMY    . CARDIAC CATHETERIZATION N/A 08/07/2015   Procedure: Left Heart Cath and Coronary Angiography;  Surgeon: [name], MD;  Location: [place] [facility];  Service: Cardiovascular;  Laterality: N/A;  . CARDIAC CATHETERIZATION N/A 08/07/2015   Procedure: Coronary Stent Intervention;  Surgeon: [name], MD;  Location: [place] [facility];  Service: Cardiovascular;  Laterality: N/A;  . CARDIAC CATHETERIZATION N/A 08/07/2015   Procedure: Coronary Stent Intervention;  Surgeon: [name], MD;  Location: [place] [facility];  Service: Cardiovascular;  Laterality: N/A;  . CAROTID ENDARTERECTOMY  01/05/2006   Right  CEA with DPA  . CORONARY STENT PLACEMENT  08/07/2015   M[facility] [place]  . HIP SURGERY Left 10/2016   left hip tendon repair  . SPINE SURGERY       Home Medications:  Prior to Admission medications   Medication Sig Start Date Ludmilla Mcgillis Date Taking? Authorizing Provider  AMBULATORY NON FORMULARY MEDICATION Take 180 mg by mouth daily. Medication Name: bempedoic acid vs placebo. CLEAR Research study drug provided 11/18/15  Yes Lelon Perla, MD  amLODipine (NORVASC) 5 MG tablet TAKE 1 TABLET BY MOUTH DAILY 07/07/17  Yes [name], MD  aspirin EC 81 MG tablet Take 1 tablet (81 mg  total) by mouth daily. 09/08/15  Yes Burnell Blanks, MD  cyclobenzaprine (FLEXERIL) 10 MG tablet TAKE 1 TABLET BY MOUTH AT BEDTIME. 01/26/17  Yes Opalski, Deborah, DO  gabapentin (NEURONTIN) 100 MG capsule Take 2 capsules (200 mg total) by mouth at bedtime. 09/20/17  Yes Opalski, Neoma Laming, DO  lisinopril (PRINIVIL,ZESTRIL) 10 MG tablet Take 1 tablet (10 mg total) by mouth daily. 01/17/17  Yes [name], MD  meloxicam (MOBIC) 7.5 MG tablet Take 2 tablets (15 mg total)  by mouth daily. Take 1-2 tablets daily by mouth for severe joint pains 09/20/16  Yes Opalski, Deborah, DO  metoprolol succinate (TOPROL-XL) 25 MG 24 hr tablet TAKE 1 TABLET BY MOUTH ONCE DAILY. 05/05/17  Yes [name], MD  nitroGLYCERIN (NITROSTAT) 0.4 MG SL tablet Place 1 tablet (0.4 mg total) under the tongue every 5 (five) minutes as needed for chest pain. 08/08/15  Yes Kilroy, Luke K, PA-C  pantoprazole (PROTONIX) 40 MG tablet Take 1 tablet (40 mg total) by mouth daily. 05/18/17  Yes Ladene Artist, MD  traMADol (ULTRAM) 50 MG tablet Take 50 mg by mouth every 6 (six) hours as needed for moderate pain.  11/05/13  Yes [provider]    Inpatient Medications: Scheduled Meds: . amLODipine  5 mg Oral Daily  . aspirin EC  81 mg Oral Daily  . docusate sodium  100 mg Oral BID  . gabapentin  200 mg Oral QHS  . heparin  5,000 Units Subcutaneous Q8H  . lisinopril  10 mg Oral Daily  . metoprolol succinate  25 mg Oral Daily  . pantoprazole  40 mg Oral Daily   Continuous Infusions:  PRN Meds: acetaminophen **OR** acetaminophen, bisacodyl, morphine injection, nitroGLYCERIN, ondansetron **OR** ondansetron (ZOFRAN) IV, traMADol, traZODone  Allergies:    Allergies  Allergen Reactions  . Brilinta [Ticagrelor]     Shortness of breath  . Zetia [Ezetimibe] Other (See Comments)    Muscle aches  . Statins Other (See Comments)    Failed Crestor 5 mg twice weekly, Crestor 20 mg daily, Pravastatin 40 mg qd, Lipitor, Zocor - muscle aches Failed Crestor 5 mg twice weekly, Crestor 20 mg daily, Pravastatin 40 mg qd, Lipitor, Zocor - muscle aches    Social History:   Social History   Socioeconomic History  . Marital status: Married    Spouse name: Not on file  . Number of children: 1  . Years of education: Not on file  . Highest education level: Not on file  Occupational History  . Not on file  Social Needs  . Financial resource strain: Not on file  . Food insecurity:    Worry: Not on  file    Inability: Not on file  . Transportation needs:    Medical: Not on file    Non-medical: Not on file  Tobacco Use  . Smoking status: Former Smoker    Packs/day: 1.25    Years: 35.00    Pack years: 43.75    Types: Cigarettes    Last attempt to quit: 02/28/2005    Years since quitting: 12.7  . Smokeless tobacco: Current User    Types: Snuff  Substance and Sexual Activity  . Alcohol use: Yes    Alcohol/week: 8.0 - 10.0 standard drinks    Types: 8 - 10 Glasses of wine per week  . Drug use: No  . Sexual activity: Yes  Lifestyle  . Physical activity:    Days per week: Not on file  Minutes per session: Not on file  . Stress: Not on file  Relationships  . Social connections:    Talks on phone: Not on file    Gets together: Not on file    Attends religious service: Not on file    Active member of club or organization: Not on file    Attends meetings of clubs or organizations: Not on file    Relationship status: Not on file  . Intimate partner violence:    Fear of current or ex partner: Not on file    Emotionally abused: Not on file    Physically abused: Not on file    Forced sexual activity: Not on file  Other Topics Concern  . Not on file  Social History Narrative  . Not on file    Family History:   Family History  Problem Relation Age of Onset  . Heart attack Mother   . Coronary artery disease Mother   . Heart disease Mother        Carotid Stenosis and BPG and Heart Disease before age 18  . Diabetes Mother   . Hypertension Mother   . Heart attack Father   . Heart disease Father        BPG and Heart Disease before age 69  . Hypertension Father   . Cancer Father 55       throat  . Stroke Father   . Colon cancer Neg Hx   . Colon polyps Neg Hx   . Esophageal cancer Neg Hx   . Rectal cancer Neg Hx   . Stomach cancer Neg Hx      ROS:  Please see the history of present illness.  All other ROS reviewed and negative.     Physical Exam/Data:   Vitals:    11/25/17 0720 11/25/17 1123 11/25/17 1405 11/25/17 1642  BP: 129/85 (!) 129/98  (!) 146/98  Pulse: 84 91  66  Resp: 15 18  20   Temp: 98 F (36.7 C) 97.6 F (36.4 C)  98.1 F (36.7 C)  TempSrc: Oral Oral  Oral  SpO2: 97% 96% 93% 95%  Weight:      Height:        Intake/Output Summary (Last 24 hours) at 11/25/2017 1720 Last data filed at 11/25/2017 1351 Gross per 24 hour  Intake 240 ml  Output 725 ml  Net -485 ml   Filed Weights   11/24/17 2106 11/25/17 0327  Weight: 79.4 kg 78.7 kg   Body mass index is 28.02 kg/m.  General:  Well nourished, well developed, in no acute distress. HEENT: normal Lymph: no adenopathy Neck: no JVD Endocrine:  No thryomegaly Vascular: No carotid bruits; FA pulses 2+ bilaterally without bruits  Cardiac:  normal S1, S2; RRR; no murmur. Lungs:  clear to auscultation bilaterally, no wheezing, rhonchi or rales  Abd: soft, nontender, no hepatomegaly  Ext: no edema Musculoskeletal:  No deformities, BUE and BLE strength normal and equal Skin: warm and dry  Neuro:  CNs 2-12 intact, no focal abnormalities noted Psych:  Normal affect   EKG:  The EKG was personally reviewed and demonstrates:  NSR with LBBB and isolated PVC.Marland Kitchen Telemetry:  Telemetry was personally reviewed and demonstrates:  NSR with PVCs.  Relevant CV Studies: Echo (11/25/17): - Left ventricle: The cavity size was normal. Wall thickness was   increased in a pattern of mild LVH. Systolic function was mildly   reduced. The estimated ejection fraction was in the range of 45%  to 50%. Regional wall motion abnormalities cannot be excluded.   Doppler parameters are consistent with abnormal left ventricular   relaxation (grade 1 diastolic dysfunction). - Ventricular septum: Septal motion showed abnormal function. These   changes are consistent with a left bundle branch block. - Aortic valve: There was trivial regurgitation. - Mitral valve: There was mild regurgitation. - Left atrium: The  atrium was mildly dilated. - Right ventricle: The cavity size was normal. Systolic function   was normal.  Pharmacologic MPI (see Radiology/Studies below)  Laboratory Data:  Chemistry Recent Labs  Lab 11/24/17 2105 11/25/17 0602  NA 140 143  K 3.8 4.1  CL 102 106  CO2 22 25  GLUCOSE 104* 99  BUN 24* 23  CREATININE 0.95 0.77  CALCIUM 9.1 8.7*  GFRNONAA >60 >60  GFRAA >60 >60  ANIONGAP 16* 12    No results for input(s): PROT, ALBUMIN, AST, ALT, ALKPHOS, BILITOT in the last 168 hours. Hematology Recent Labs  Lab 11/24/17 2105 11/25/17 0602  WBC 5.5 6.1  RBC 4.35* 4.39*  HGB 14.0 13.9  HCT 40.0 40.5  MCV 92.1 92.2  MCH 32.2 31.6  MCHC 35.0 34.3  RDW 13.2 13.4  PLT 239 235   Cardiac Enzymes Recent Labs  Lab 11/24/17 2105 11/25/17 0022 11/25/17 0602  TROPONINI <0.03 <0.03 <0.03   No results for input(s): TROPIPOC in the last 168 hours.  BNPNo results for input(s): BNP, PROBNP in the last 168 hours.  DDimer No results for input(s): DDIMER in the last 168 hours.  Radiology/Studies:  Dg Chest 2 View  Result Date: 11/24/2017 CLINICAL DATA:  67 year old male with chest pain. EXAM: CHEST - 2 VIEW COMPARISON:  Chest radiograph dated 09/19/2017 FINDINGS: There are bibasilar atelectatic changes/scarring. No focal consolidation, pleural effusion, or pneumothorax. The cardiac silhouette is within normal limits. No acute osseous pathology. IMPRESSION: No active cardiopulmonary disease. Electronically Signed   By: Anner Crete M.D.   On: 11/24/2017 21:27   Nm Myocar Multi W/spect W/wall Motion / Ef  Result Date: 11/25/2017  Abnormal, potentially high risk pharmacologic myocardial perfusion stress test.  There is a large in size, mild to moderately severe, minimally reversible defect involving the septum and anterior walls, as well as the apex. While this may represent artifact from LBBB, anterior and apical involvement raises the possibility of scar with periinfarct  ischemia.  The left ventricular ejection fraction is severely decreased (29%) with global hypokinesis and a dyskinetic septum.     Assessment and Plan:   Coronary artery disease with atypical angina Quality and timing of chest pain is unusual for coronary insufficiency.  I am most concerned about pleurisy or musculoskeletal pain.  Pericarditis is a consideration, but EKG assessment is limited due to LBBB.  Additionally, echo does not show pericardial effusion nor is a rub heart on exam.  However, echo and stress test are abnormal (as detailed above).  Given abnormal stress test and reduced LVEF by echo and MPI, I have recommended further evaluation with cardiac catheterization on Monday (9/30).  I have reviewed the risks, indications, and alternatives to cardiac catheterization, possible angioplasty, and stenting with the patient. Risks include but are not limited to bleeding, infection, vascular injury, stroke, myocardial infection, arrhythmia, kidney injury, radiation-related injury in the case of prolonged fluoroscopy use, emergency cardiac surgery, and death. The patient understands the risks of serious complication is 1-2 in 7124 with diagnostic cardiac cath and 1-2% or less with angioplasty/stenting.  Initiate isosorbide mononitrate 30 mg daily  for antianginal therapy.  Continue amlodipine and metoprolol succinate.  Continue aspirin 81 mg daily.  Hypertension  Continue amlodipine, lisinopril, and metoprolol.  Add isosorbide mononitrate, as above.  Hyperlipidemia Patient enrolled in CLEAR trial, given history of intolerance to multiple statins and ezetimibe.  LDL above goal this AM (goal < 70).  Contact research team on Monday regarding admission and lipid value.  May need to consider transition to PCSK9 inhibitor if evidence of worsening CAD by cath.  For questions or updates, please contact [place] Please consult [email or web address] for contact info under [facility]  Cardiology.  Signed, Nelva Bush, MD  11/25/2017 5:20 PM

## 2017-11-25 NOTE — Progress Notes (Addendum)
Pt compliants of 5 out 10 pain. Pt adminsitered nitro tab x3 but pain is still at 5. Notify prime. Awaiting callback. Will continue to monitor.  Update 0550: Docotor Prasanna ordered morhine 2 -4 mg Every 3 hours and nitroglycerin 1 inch once, Will continue to monitor.  Update 0650: Talked to [facility] and states if they can have a order for troponin check one time before they take pt down for stress test. Page prime. Will continue to monitor.  Update 0654: Talked to docotro Prasanna and he ordered troponin check once. Will continue to monitor.

## 2017-11-25 NOTE — Plan of Care (Signed)
  Problem: Education: Goal: Knowledge of General Education information will improve Description: Including pain rating scale, medication(s)/side effects and non-pharmacologic comfort measures Outcome: Progressing   Problem: Health Behavior/Discharge Planning: Goal: Ability to manage health-related needs will improve Outcome: Progressing   Problem: Pain Managment: Goal: General experience of comfort will improve Outcome: Progressing   

## 2017-11-25 NOTE — Progress Notes (Signed)
[facility] [place] NAME: [name]    MR#:  [id number]  DATE OF BIRTH:  04-Nov-1950  SUBJECTIVE:  CHIEF COMPLAINT:   Chief Complaint  Patient presents with  . Chest Pain   The patient still has intermittent chest pain. REVIEW OF SYSTEMS:  Review of Systems  Constitutional: Negative for chills, fever and malaise/fatigue.  HENT: Negative for sore throat.   Eyes: Negative for blurred vision and double vision.  Respiratory: Negative for cough, hemoptysis, shortness of breath, wheezing and stridor.   Cardiovascular: Positive for chest pain. Negative for palpitations, orthopnea and leg swelling.  Gastrointestinal: Negative for abdominal pain, blood in stool, diarrhea, melena, nausea and vomiting.  Genitourinary: Negative for dysuria, flank pain and hematuria.  Musculoskeletal: Negative for back pain and joint pain.  Skin: Negative for rash.  Neurological: Negative for dizziness, sensory change, focal weakness, seizures, loss of consciousness, weakness and headaches.  Endo/Heme/Allergies: Negative for polydipsia.  Psychiatric/Behavioral: Negative for depression. The patient is not nervous/anxious.     DRUG ALLERGIES:   Allergies  Allergen Reactions  . Brilinta [Ticagrelor]     Shortness of breath  . Zetia [Ezetimibe] Other (See Comments)    Muscle aches  . Statins Other (See Comments)    Failed Crestor 5 mg twice weekly, Crestor 20 mg daily, Pravastatin 40 mg qd, Lipitor, Zocor - muscle aches Failed Crestor 5 mg twice weekly, Crestor 20 mg daily, Pravastatin 40 mg qd, Lipitor, Zocor - muscle aches   VITALS:  Blood pressure (!) 129/98, pulse 91, temperature 97.6 F (36.4 C), temperature source Oral, resp. rate 18, height 5\' 6"  (1.676 m), weight 78.7 kg, SpO2 93 %. PHYSICAL EXAMINATION:  Physical Exam  Constitutional: He is oriented to person, place, and time. He appears well-developed. No distress.  HENT:  Head: Normocephalic.    Mouth/Throat: Oropharynx is clear and moist.  Eyes: Pupils are equal, round, and reactive to light. Conjunctivae and EOM are normal. No scleral icterus.  Neck: Normal range of motion. Neck supple. No JVD present. No tracheal deviation present.  Cardiovascular: Normal rate, regular rhythm and normal heart sounds. Exam reveals no gallop.  No murmur heard. Pulmonary/Chest: Effort normal and breath sounds normal. No respiratory distress. He has no wheezes. He has no rales.  Abdominal: Soft. Bowel sounds are normal. He exhibits no distension. There is no tenderness. There is no rebound.  Musculoskeletal: Normal range of motion. He exhibits no edema or tenderness.  Neurological: He is alert and oriented to person, place, and time. No cranial nerve deficit.  Skin: No rash noted. No erythema.  Psychiatric: He has a normal mood and affect.   LABORATORY PANEL:  Male CBC Recent Labs  Lab 11/25/17 0602  WBC 6.1  HGB 13.9  HCT 40.5  PLT 235   ------------------------------------------------------------------------------------------------------------------ Chemistries  Recent Labs  Lab 11/25/17 0602  NA 143  K 4.1  CL 106  CO2 25  GLUCOSE 99  BUN 23  CREATININE 0.77  CALCIUM 8.7*   RADIOLOGY:  Dg Chest 2 View  Result Date: 11/24/2017 CLINICAL DATA:  67 year old male with chest pain. EXAM: CHEST - 2 VIEW COMPARISON:  Chest radiograph dated 09/19/2017 FINDINGS: There are bibasilar atelectatic changes/scarring. No focal consolidation, pleural effusion, or pneumothorax. The cardiac silhouette is within normal limits. No acute osseous pathology. IMPRESSION: No active cardiopulmonary disease. Electronically Signed   By: Anner Crete M.D.   On: 11/24/2017 21:27   ASSESSMENT AND PLAN:   1.  Unstable angina.  No acute coronary syndrome.  Continue to monitor on telemetry, Normal troponin levels, LV EF: 45% -   50% per 2D echo, but abnormal stress test.   Cardiac cath on Monday per Dr.  Marisue Humble.  2.  CAD, status post previous MI, status post stent placement.  Continue aspirin, Lipitor, Toprol and lisinopril.  3.  Hypertension, stable, continue home medications. 4.  History of hyperlipidemia, LDL is better, 106.  Discussed with Dr. Marisue Humble. All the records are reviewed and case discussed with Care Management/Social Worker. Management plans discussed with the patient, his wife and they are in agreement.  CODE STATUS: Full Code  TOTAL TIME TAKING CARE OF THIS PATIENT: 35 minutes.   More than 50% of the time was spent in counseling/coordination of care: YES  POSSIBLE D/C IN 2-3 DAYS, DEPENDING ON CLINICAL CONDITION.   Demetrios Loll M.D on 11/25/2017 at 4:16 PM  Between 7am to 6pm - Pager - [phone]  After 6pm go to [email or web address] - [facility]

## 2017-11-25 NOTE — ED Notes (Signed)
Admitting Provider at bedside. 

## 2017-11-25 NOTE — ED Notes (Addendum)
Family and patient updated on room wait. Recliner chair given to family

## 2017-11-25 NOTE — ED Notes (Signed)
Pt is questioning need to stay in hospital overnight. Would like to go home. Admitting provider paged awaiting her to see patient and family.

## 2017-11-26 DIAGNOSIS — I429 Cardiomyopathy, unspecified: Secondary | ICD-10-CM

## 2017-11-26 DIAGNOSIS — I208 Other forms of angina pectoris: Secondary | ICD-10-CM

## 2017-11-26 LAB — GLUCOSE, CAPILLARY: GLUCOSE-CAPILLARY: 118 mg/dL — AB (ref 70–99)

## 2017-11-26 MED ORDER — SODIUM CHLORIDE 0.9% FLUSH
3.0000 mL | Freq: Two times a day (BID) | INTRAVENOUS | Status: DC
  Administered 2017-11-26 (×2): 3 mL via INTRAVENOUS

## 2017-11-26 MED ORDER — ASPIRIN 81 MG PO CHEW
81.0000 mg | CHEWABLE_TABLET | ORAL | Status: AC
  Administered 2017-11-27: 81 mg via ORAL
  Filled 2017-11-26: qty 1

## 2017-11-26 MED ORDER — SODIUM CHLORIDE 0.9% FLUSH: 3.0000 mL | INTRAVENOUS | Status: DC | PRN

## 2017-11-26 MED ORDER — SODIUM CHLORIDE 0.9 % IV SOLN
INTRAVENOUS | Status: DC
  Administered 2017-11-27: 05:00:00 via INTRAVENOUS

## 2017-11-26 MED ORDER — SODIUM CHLORIDE 0.9 % IV SOLN: 250.0000 mL | INTRAVENOUS | Status: DC | PRN

## 2017-11-26 MED ORDER — COLCHICINE 0.6 MG PO TABS
0.6000 mg | ORAL_TABLET | Freq: Every day | ORAL | Status: DC
  Administered 2017-11-26 – 2017-11-27 (×2): 0.6 mg via ORAL
  Filled 2017-11-26 (×2): qty 1

## 2017-11-26 NOTE — Progress Notes (Signed)
Progress Note  Patient Name: [name] Date of Encounter: 11/26/2017  Primary Cardiologist: Candee Furbish, MD  Subjective   Chest pain improved with addition of indoor.  Still present predominantly when lying flat but negligible at this time.  No shortness of breath.  Inpatient Medications    Scheduled Meds: . amLODipine  5 mg Oral Daily  . aspirin EC  81 mg Oral Daily  . docusate sodium  100 mg Oral BID  . gabapentin  200 mg Oral QHS  . heparin  5,000 Units Subcutaneous Q8H  . isosorbide mononitrate  30 mg Oral Daily  . lisinopril  10 mg Oral Daily  . metoprolol succinate  25 mg Oral Daily  . pantoprazole  40 mg Oral Daily   Continuous Infusions:  PRN Meds: acetaminophen **OR** acetaminophen, bisacodyl, morphine injection, nitroGLYCERIN, ondansetron **OR** ondansetron (ZOFRAN) IV, traMADol, traZODone   Vital Signs    Vitals:   11/25/17 1642 11/25/17 1933 11/26/17 0417 11/26/17 0754  BP: (!) 146/98 107/61 119/76 121/84  Pulse: 66 68 70 71  Resp: 20 18 18 18   Temp: 98.1 F (36.7 C) 98 F (36.7 C) 98.3 F (36.8 C) 97.8 F (36.6 C)  TempSrc: Oral Oral Oral Oral  SpO2: 95% 96% 96% 96%  Weight:   78.7 kg   Height:        Intake/Output Summary (Last 24 hours) at 11/26/2017 0934 Last data filed at 11/26/2017 0419 Gross per 24 hour  Intake 480 ml  Output 1625 ml  Net -1145 ml   Filed Weights   11/24/17 2106 11/25/17 0327 11/26/17 0417  Weight: 79.4 kg 78.7 kg 78.7 kg    Telemetry    Normal sinus rhythm- Personally Reviewed  ECG    No new tracing.  Physical Exam   GEN: No acute distress.   Neck: No JVD Cardiac: RRR, no murmurs, rubs, or gallops.  Respiratory: Clear to auscultation bilaterally. GI: Soft, nontender, non-distended  MS: No edema; No deformity. Neuro:  Nonfocal  Psych: Normal affect   Labs    Chemistry Recent Labs  Lab 11/24/17 2105 11/25/17 0602  NA 140 143  K 3.8 4.1  CL 102 106  CO2 22 25  GLUCOSE 104* 99  BUN 24* 23    CREATININE 0.95 0.77  CALCIUM 9.1 8.7*  GFRNONAA >60 >60  GFRAA >60 >60  ANIONGAP 16* 12     Hematology Recent Labs  Lab 11/24/17 2105 11/25/17 0602  WBC 5.5 6.1  RBC 4.35* 4.39*  HGB 14.0 13.9  HCT 40.0 40.5  MCV 92.1 92.2  MCH 32.2 31.6  MCHC 35.0 34.3  RDW 13.2 13.4  PLT 239 235    Cardiac Enzymes Recent Labs  Lab 11/24/17 2105 11/25/17 0022 11/25/17 0602  TROPONINI <0.03 <0.03 <0.03   No results for input(s): TROPIPOC in the last 168 hours.   BNPNo results for input(s): BNP, PROBNP in the last 168 hours.   DDimer No results for input(s): DDIMER in the last 168 hours.   Radiology    Dg Chest 2 View  Result Date: 11/24/2017 CLINICAL DATA:  67 year old male with chest pain. EXAM: CHEST - 2 VIEW COMPARISON:  Chest radiograph dated 09/19/2017 FINDINGS: There are bibasilar atelectatic changes/scarring. No focal consolidation, pleural effusion, or pneumothorax. The cardiac silhouette is within normal limits. No acute osseous pathology. IMPRESSION: No active cardiopulmonary disease. Electronically Signed   By: Anner Crete M.D.   On: 11/24/2017 21:27   Nm Myocar Multi W/spect W/wall Motion /  Ef  Result Date: 11/25/2017  Abnormal, potentially high risk pharmacologic myocardial perfusion stress test.  There is a large in size, mild to moderately severe, minimally reversible defect involving the septum and anterior walls, as well as the apex. While this may represent artifact from LBBB, anterior and apical involvement raises the possibility of scar with periinfarct ischemia.  The left ventricular ejection fraction is severely decreased (29%) with global hypokinesis and a dyskinetic septum.     Cardiac Studies   Echo (11/25/17): - Left ventricle: The cavity size was normal. Wall thickness was increased in a pattern of mild LVH. Systolic function was mildly reduced. The estimated ejection fraction was in the range of 45% to 50%. Regional wall motion  abnormalities cannot be excluded. Doppler parameters are consistent with abnormal left ventricular relaxation (grade 1 diastolic dysfunction). - Ventricular septum: Septal motion showed abnormal function. These changes are consistent with a left bundle branch block. - Aortic valve: There was trivial regurgitation. - Mitral valve: There was mild regurgitation. - Left atrium: The atrium was mildly dilated. - Right ventricle: The cavity size was normal. Systolic function was normal.  Pharmacologic MPI (see Radiology above)  Patient Profile     67 y.o. male male with history of coronary artery disease status post PCI to the LCx in 2017, admitted with atypical chest pain and found to have reduced LVEF and evidence of anterior/septal scar with peri-infarct ischemia versus artifact on stress test.  Assessment & Plan    Atypical angina Patient still with mild intermittent chest pain that is positional.  It is atypical for coronary insufficiency.  However, given reduced LVEF by echo and stress test as well as potential scar with peri-infarct ischemia versus LBBB artifact, we will proceed with cardiac catheterization tomorrow.  Left heart catheterization and possible PCI tomorrow.  Risks and benefits discussed with patient.  Continue aspirin, amlodipine, metoprolol, and isosorbide mononitrate.  Empiric trial of colchicine in case there is an element of pericarditis, based on quality of pain.  Cardiomyopathy Uncertain etiology, whether ischemic or nonischemic (potentially related to LBBB).  Continue lisinopril and metoprolol succinate.  No diuresis, as patient appears euvolemic on exam.  Will provide gentle IV hydration after midnight in anticipation of tomorrow's catheterization.  Hypertension Blood pressure currently well controlled.  No medication changes at this time.  Hyperlipidemia Patient enrolled in clear study.  LDL suboptimally controlled.  Readdress with research  team tomorrow regarding continuation of clear trial versus consideration of PCSK9 inhibitor in the future given intolerance to statins and ezetimibe.   For questions or updates, please contact [place] Please consult [email or web address] for contact info under   Signed, Nelva Bush, MD  11/26/2017, 9:34 AM

## 2017-11-26 NOTE — H&P (View-Only) (Signed)
Progress Note  Patient Name: [name] Date of Encounter: 11/26/2017  Primary Cardiologist: Candee Furbish, MD  Subjective   Chest pain improved with addition of indoor.  Still present predominantly when lying flat but negligible at this time.  No shortness of breath.  Inpatient Medications    Scheduled Meds: . amLODipine  5 mg Oral Daily  . aspirin EC  81 mg Oral Daily  . docusate sodium  100 mg Oral BID  . gabapentin  200 mg Oral QHS  . heparin  5,000 Units Subcutaneous Q8H  . isosorbide mononitrate  30 mg Oral Daily  . lisinopril  10 mg Oral Daily  . metoprolol succinate  25 mg Oral Daily  . pantoprazole  40 mg Oral Daily   Continuous Infusions:  PRN Meds: acetaminophen **OR** acetaminophen, bisacodyl, morphine injection, nitroGLYCERIN, ondansetron **OR** ondansetron (ZOFRAN) IV, traMADol, traZODone   Vital Signs    Vitals:   11/25/17 1642 11/25/17 1933 11/26/17 0417 11/26/17 0754  BP: (!) 146/98 107/61 119/76 121/84  Pulse: 66 68 70 71  Resp: 20 18 18 18   Temp: 98.1 F (36.7 C) 98 F (36.7 C) 98.3 F (36.8 C) 97.8 F (36.6 C)  TempSrc: Oral Oral Oral Oral  SpO2: 95% 96% 96% 96%  Weight:   78.7 kg   Height:        Intake/Output Summary (Last 24 hours) at 11/26/2017 0934 Last data filed at 11/26/2017 0419 Gross per 24 hour  Intake 480 ml  Output 1625 ml  Net -1145 ml   Filed Weights   11/24/17 2106 11/25/17 0327 11/26/17 0417  Weight: 79.4 kg 78.7 kg 78.7 kg    Telemetry    Normal sinus rhythm- Personally Reviewed  ECG    No new tracing.  Physical Exam   GEN: No acute distress.   Neck: No JVD Cardiac: RRR, no murmurs, rubs, or gallops.  Respiratory: Clear to auscultation bilaterally. GI: Soft, nontender, non-distended  MS: No edema; No deformity. Neuro:  Nonfocal  Psych: Normal affect   Labs    Chemistry Recent Labs  Lab 11/24/17 2105 11/25/17 0602  NA 140 143  K 3.8 4.1  CL 102 106  CO2 22 25  GLUCOSE 104* 99  BUN 24* 23    CREATININE 0.95 0.77  CALCIUM 9.1 8.7*  GFRNONAA >60 >60  GFRAA >60 >60  ANIONGAP 16* 12     Hematology Recent Labs  Lab 11/24/17 2105 11/25/17 0602  WBC 5.5 6.1  RBC 4.35* 4.39*  HGB 14.0 13.9  HCT 40.0 40.5  MCV 92.1 92.2  MCH 32.2 31.6  MCHC 35.0 34.3  RDW 13.2 13.4  PLT 239 235    Cardiac Enzymes Recent Labs  Lab 11/24/17 2105 11/25/17 0022 11/25/17 0602  TROPONINI <0.03 <0.03 <0.03   No results for input(s): TROPIPOC in the last 168 hours.   BNPNo results for input(s): BNP, PROBNP in the last 168 hours.   DDimer No results for input(s): DDIMER in the last 168 hours.   Radiology    Dg Chest 2 View  Result Date: 11/24/2017 CLINICAL DATA:  67 year old male with chest pain. EXAM: CHEST - 2 VIEW COMPARISON:  Chest radiograph dated 09/19/2017 FINDINGS: There are bibasilar atelectatic changes/scarring. No focal consolidation, pleural effusion, or pneumothorax. The cardiac silhouette is within normal limits. No acute osseous pathology. IMPRESSION: No active cardiopulmonary disease. Electronically Signed   By: Anner Crete M.D.   On: 11/24/2017 21:27   Nm Myocar Multi W/spect W/wall Motion /  Ef  Result Date: 11/25/2017  Abnormal, potentially high risk pharmacologic myocardial perfusion stress test.  There is a large in size, mild to moderately severe, minimally reversible defect involving the septum and anterior walls, as well as the apex. While this may represent artifact from LBBB, anterior and apical involvement raises the possibility of scar with periinfarct ischemia.  The left ventricular ejection fraction is severely decreased (29%) with global hypokinesis and a dyskinetic septum.     Cardiac Studies   Echo (11/25/17): - Left ventricle: The cavity size was normal. Wall thickness was increased in a pattern of mild LVH. Systolic function was mildly reduced. The estimated ejection fraction was in the range of 45% to 50%. Regional wall motion  abnormalities cannot be excluded. Doppler parameters are consistent with abnormal left ventricular relaxation (grade 1 diastolic dysfunction). - Ventricular septum: Septal motion showed abnormal function. These changes are consistent with a left bundle branch block. - Aortic valve: There was trivial regurgitation. - Mitral valve: There was mild regurgitation. - Left atrium: The atrium was mildly dilated. - Right ventricle: The cavity size was normal. Systolic function was normal.  Pharmacologic MPI (see Radiology above)  Patient Profile     67 y.o. male male with history of coronary artery disease status post PCI to the LCx in 2017, admitted with atypical chest pain and found to have reduced LVEF and evidence of anterior/septal scar with peri-infarct ischemia versus artifact on stress test.  Assessment & Plan    Atypical angina Patient still with mild intermittent chest pain that is positional.  It is atypical for coronary insufficiency.  However, given reduced LVEF by echo and stress test as well as potential scar with peri-infarct ischemia versus LBBB artifact, we will proceed with cardiac catheterization tomorrow.  Left heart catheterization and possible PCI tomorrow.  Risks and benefits discussed with patient.  Continue aspirin, amlodipine, metoprolol, and isosorbide mononitrate.  Empiric trial of colchicine in case there is an element of pericarditis, based on quality of pain.  Cardiomyopathy Uncertain etiology, whether ischemic or nonischemic (potentially related to LBBB).  Continue lisinopril and metoprolol succinate.  No diuresis, as patient appears euvolemic on exam.  Will provide gentle IV hydration after midnight in anticipation of tomorrow's catheterization.  Hypertension Blood pressure currently well controlled.  No medication changes at this time.  Hyperlipidemia Patient enrolled in clear study.  LDL suboptimally controlled.  Readdress with research  team tomorrow regarding continuation of clear trial versus consideration of PCSK9 inhibitor in the future given intolerance to statins and ezetimibe.   For questions or updates, please contact [place] Please consult [email or web address] for contact info under   Signed, Nelva Bush, MD  11/26/2017, 9:34 AM

## 2017-11-26 NOTE — Progress Notes (Signed)
[facility] at [place] NAME: [name]    MR#:  [id number]  DATE OF BIRTH:  Jan 06, 1951  SUBJECTIVE:  CHIEF COMPLAINT:   Chief Complaint  Patient presents with  . Chest Pain   The patient has no chest pain. REVIEW OF SYSTEMS:  Review of Systems  Constitutional: Negative for chills, fever and malaise/fatigue.  HENT: Negative for sore throat.   Eyes: Negative for blurred vision and double vision.  Respiratory: Negative for cough, hemoptysis, shortness of breath, wheezing and stridor.   Cardiovascular: Negative for chest pain, palpitations, orthopnea and leg swelling.  Gastrointestinal: Negative for abdominal pain, blood in stool, diarrhea, melena, nausea and vomiting.  Genitourinary: Negative for dysuria, flank pain and hematuria.  Musculoskeletal: Negative for back pain and joint pain.  Skin: Negative for rash.  Neurological: Negative for dizziness, sensory change, focal weakness, seizures, loss of consciousness, weakness and headaches.  Endo/Heme/Allergies: Negative for polydipsia.  Psychiatric/Behavioral: Negative for depression. The patient is not nervous/anxious.     DRUG ALLERGIES:   Allergies  Allergen Reactions  . Brilinta [Ticagrelor]     Shortness of breath  . Zetia [Ezetimibe] Other (See Comments)    Muscle aches  . Statins Other (See Comments)    Failed Crestor 5 mg twice weekly, Crestor 20 mg daily, Pravastatin 40 mg qd, Lipitor, Zocor - muscle aches Failed Crestor 5 mg twice weekly, Crestor 20 mg daily, Pravastatin 40 mg qd, Lipitor, Zocor - muscle aches   VITALS:  Blood pressure 121/84, pulse 71, temperature 97.8 F (36.6 C), temperature source Oral, resp. rate 18, height 5\' 6"  (1.676 m), weight 78.7 kg, SpO2 96 %. PHYSICAL EXAMINATION:  Physical Exam  Constitutional: He is oriented to person, place, and time. He appears well-developed. No distress.  HENT:  Head: Normocephalic.  Mouth/Throat: Oropharynx is  clear and moist.  Eyes: Pupils are equal, round, and reactive to light. Conjunctivae and EOM are normal. No scleral icterus.  Neck: Normal range of motion. Neck supple. No JVD present. No tracheal deviation present.  Cardiovascular: Normal rate, regular rhythm and normal heart sounds. Exam reveals no gallop.  No murmur heard. Pulmonary/Chest: Effort normal and breath sounds normal. No respiratory distress. He has no wheezes. He has no rales.  Abdominal: Soft. Bowel sounds are normal. He exhibits no distension. There is no tenderness. There is no rebound.  Musculoskeletal: Normal range of motion. He exhibits no edema or tenderness.  Neurological: He is alert and oriented to person, place, and time. No cranial nerve deficit.  Skin: No rash noted. No erythema.  Psychiatric: He has a normal mood and affect.   LABORATORY PANEL:  Male CBC Recent Labs  Lab 11/25/17 0602  WBC 6.1  HGB 13.9  HCT 40.5  PLT 235   ------------------------------------------------------------------------------------------------------------------ Chemistries  Recent Labs  Lab 11/25/17 0602  NA 143  K 4.1  CL 106  CO2 25  GLUCOSE 99  BUN 23  CREATININE 0.77  CALCIUM 8.7*   RADIOLOGY:  No results found. ASSESSMENT AND PLAN:   1.    Atypical angina.  No acute coronary syndrome.  Continue to monitor on telemetry, Normal troponin levels, LV EF: 45% -   50% per 2D echo, but abnormal stress test.   Cardiac cath tomorrow per Dr. Marisue Humble.  2.  CAD and cardiomyopathy, status post previous MI, status post stent placement.  Continue aspirin, Lipitor, Toprol and lisinopril.  3.  Hypertension, stable, continue home medications. 4.  History of  hyperlipidemia, LDL is better, 106.  Discussed with Dr. Marisue Humble. All the records are reviewed and case discussed with Care Management/Social Worker. Management plans discussed with the patient, his wife and they are in agreement.  CODE STATUS: Full Code  TOTAL TIME TAKING  CARE OF THIS PATIENT: 27 minutes.   More than 50% of the time was spent in counseling/coordination of care: YES  POSSIBLE D/C IN 1-2 DAYS, DEPENDING ON CLINICAL CONDITION.   Demetrios Loll M.D on 11/26/2017 at 1:29 PM  Between 7am to 6pm - Pager - [phone]  After 6pm go to [email or web address] - [facility]

## 2017-11-27 ENCOUNTER — Encounter
Admission: EM | Disposition: A | Discharge status: Home / Self Care | Payer: Self-pay | Source: Home / Self Care | Attending: Internal Medicine

## 2017-11-27 ENCOUNTER — Telehealth: Payer: Self-pay | Admitting: Internal Medicine

## 2017-11-27 ENCOUNTER — Encounter: Payer: Self-pay | Admitting: *Deleted

## 2017-11-27 DIAGNOSIS — I2510 Atherosclerotic heart disease of native coronary artery without angina pectoris: Secondary | ICD-10-CM

## 2017-11-27 HISTORY — PX: LEFT HEART CATH AND CORONARY ANGIOGRAPHY: CATH118249

## 2017-11-27 LAB — HIV ANTIBODY (ROUTINE TESTING W REFLEX): HIV SCREEN 4TH GENERATION: NONREACTIVE

## 2017-11-27 LAB — GLUCOSE, CAPILLARY: Glucose-Capillary: 103 mg/dL — ABNORMAL HIGH (ref 70–99)

## 2017-11-27 SURGERY — LEFT HEART CATH AND CORONARY ANGIOGRAPHY
Anesthesia: Moderate Sedation

## 2017-11-27 MED ORDER — COLCHICINE 0.6 MG PO TABS: 0.6000 mg | ORAL_TABLET | Freq: Every day | ORAL | 0 refills | Status: DC

## 2017-11-27 MED ORDER — HEPARIN (PORCINE) IN NACL 1000-0.9 UT/500ML-% IV SOLN
INTRAVENOUS | Status: AC
  Filled 2017-11-27: qty 1000

## 2017-11-27 MED ORDER — FENTANYL CITRATE (PF) 100 MCG/2ML IJ SOLN
INTRAMUSCULAR | Status: AC
  Filled 2017-11-27: qty 2

## 2017-11-27 MED ORDER — SODIUM CHLORIDE 0.9% FLUSH: 3.0000 mL | Freq: Two times a day (BID) | INTRAVENOUS | Status: DC

## 2017-11-27 MED ORDER — VERAPAMIL HCL 2.5 MG/ML IV SOLN
INTRAVENOUS | Status: AC
  Filled 2017-11-27: qty 2

## 2017-11-27 MED ORDER — MIDAZOLAM HCL 2 MG/2ML IJ SOLN
INTRAMUSCULAR | Status: AC
  Filled 2017-11-27: qty 2

## 2017-11-27 MED ORDER — FENTANYL CITRATE (PF) 100 MCG/2ML IJ SOLN
INTRAMUSCULAR | Status: DC | PRN
  Administered 2017-11-27: 50 ug via INTRAVENOUS

## 2017-11-27 MED ORDER — IOPAMIDOL (ISOVUE-300) INJECTION 61%
INTRAVENOUS | Status: DC | PRN
  Administered 2017-11-27: 120 mL via INTRA_ARTERIAL

## 2017-11-27 MED ORDER — HEPARIN SODIUM (PORCINE) 1000 UNIT/ML IJ SOLN
INTRAMUSCULAR | Status: DC | PRN
  Administered 2017-11-27: 4000 [IU] via INTRAVENOUS

## 2017-11-27 MED ORDER — HEPARIN SODIUM (PORCINE) 1000 UNIT/ML IJ SOLN
INTRAMUSCULAR | Status: AC
  Filled 2017-11-27: qty 1

## 2017-11-27 MED ORDER — MIDAZOLAM HCL 2 MG/2ML IJ SOLN
INTRAMUSCULAR | Status: DC | PRN
  Administered 2017-11-27 (×2): 1 mg via INTRAVENOUS

## 2017-11-27 MED ORDER — SODIUM CHLORIDE 0.9 % IV SOLN: INTRAVENOUS | Status: DC

## 2017-11-27 MED ORDER — SODIUM CHLORIDE 0.9% FLUSH: 3.0000 mL | INTRAVENOUS | Status: DC | PRN

## 2017-11-27 MED ORDER — SODIUM CHLORIDE 0.9 % IV SOLN: 250.0000 mL | INTRAVENOUS | Status: DC | PRN

## 2017-11-27 SURGICAL SUPPLY — 8 items
CATH INFINITI 5FR ANG PIGTAIL (CATHETERS) ×1 IMPLANT
CATH INFINITI 5FR JK (CATHETERS) ×1 IMPLANT
DEVICE RAD TR BAND REGULAR (VASCULAR PRODUCTS) ×1 IMPLANT
GLIDESHEATH SLEND SS 6F .021 (SHEATH) ×1 IMPLANT
KIT MANI 3VAL PERCEP (MISCELLANEOUS) ×2 IMPLANT
PACK CARDIAC CATH (CUSTOM PROCEDURE TRAY) ×2 IMPLANT
WIRE HITORQ VERSACORE ST 145CM (WIRE) ×1 IMPLANT
WIRE ROSEN-J .035X260CM (WIRE) ×1 IMPLANT

## 2017-11-27 NOTE — Progress Notes (Signed)
IVs and tele removed from patient. Discharge instructions given to patient, verbalize understanding. No acute distress at this time. Wife is at bedside and will transport patient home.

## 2017-11-27 NOTE — Care Management Note (Signed)
Case Management Note  Patient Details  Name: [name] MRN: [id number] Date of Birth: 1950-11-01  Subjective/Objective:        Patient from home with CP.  Cath done today.  Lives independently at home with wife.  Current with PCP.  On room air.  No services in the home.  Discharging today.  No difficulties obtaining medications or with transportation.         Action/Plan:No needs identified prior to discharge.           Expected Discharge Date:  11/27/17               Expected Discharge Plan:  Home/Self Care  In-House Referral:     Discharge planning Services  CM Consult  Post Acute Care Choice:    Choice offered to:     DME Arranged:    DME Agency:     HH Arranged:    HH Agency:     Status of Service:  Completed, signed off  If discussed at [employer] of Stay Meetings, dates discussed:    Additional Comments:  Elza Rafter, RN 11/27/2017, 2:56 PM

## 2017-11-27 NOTE — Telephone Encounter (Signed)
Lmov for patient to call and schedule appointment  ° °Will try again at a later time ° °

## 2017-11-27 NOTE — Discharge Summary (Signed)
[facility] [place] NAME: [name]    MR#:  [id number]  DATE OF BIRTH:  Apr 16, 1950  DATE OF ADMISSION:  11/24/2017   ADMITTING PHYSICIAN: Amelia Jo, MD  DATE OF DISCHARGE: 11/27/2017  3:37 PM  PRIMARY CARE PHYSICIAN: Glean Hess, MD   ADMISSION DIAGNOSIS:  Moderate risk chest pain [R07.9] DISCHARGE DIAGNOSIS:  Active Problems:   Moderate risk chest pain   Unstable angina (HCC)  SECONDARY DIAGNOSIS:   Past Medical History:  Diagnosis Date  . Abnormal nuclear cardiac imaging test 08/08/2015  . Arthritis   . Carotid artery occlusion   . Coronary atherosclerosis of native coronary artery 01/29/2013  . Duodenal erosion   . Encounter for screening for lung cancer 07/13/2016  . Esophageal stenosis   . GERD (gastroesophageal reflux disease)   . H. pylori infection   . Heart attack [facility]) Oct. 2009   Mild  . Hiatal hernia   . Hyperlipidemia   . Hypertension   . 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 Cardiac catheterization-nonobstructive 60% RCA disease-subsequent nuclear stress test-9 minutes, low risk, mild inferior wall hypokinesis   . Shortness of breath dyspnea    HOSPITAL COURSE:   [name] is a 67 year old male with a PMH of HTN, HLD, CAD s/p PCI to mid LCx in 2017 who presented to the ED with sharp left-sided chest pain for the last week. Troponins were negative. EKG without any ischemic changes. ECHO with EF 45-50% and G1DD. He underwent stress test on 9/28 that was abnormal. He underwent cardiac catheterization on 9/30, which showed mild to moderate nonobstructive CAD. Cardiology recommended medical management. His chest pain was ultimately felt to be musculoskeletal in etiology, as he has been moving and lifting heavy boxes and his chest pain completely resolved with colchicine. He was discharged home with close outpatient follow-up.  DISCHARGE CONDITIONS:    Hypertension Hyperlipidemia CAD s/p PCI to mid LCx 2017 Cardiomyopathy of unknown etiology  CONSULTS OBTAINED:  Treatment Team:  Nelva Bush, MD DRUG ALLERGIES:   Allergies  Allergen Reactions  . Brilinta [Ticagrelor]     Shortness of breath  . Zetia [Ezetimibe] Other (See Comments)    Muscle aches  . Statins Other (See Comments)    Failed Crestor 5 mg twice weekly, Crestor 20 mg daily, Pravastatin 40 mg qd, Lipitor, Zocor - muscle aches Failed Crestor 5 mg twice weekly, Crestor 20 mg daily, Pravastatin 40 mg qd, Lipitor, Zocor - muscle aches   DISCHARGE MEDICATIONS:   Allergies as of 11/27/2017      Reactions   Brilinta [ticagrelor]    Shortness of breath   Zetia [ezetimibe] Other (See Comments)   Muscle aches   Statins Other (See Comments)   Failed Crestor 5 mg twice weekly, Crestor 20 mg daily, Pravastatin 40 mg qd, Lipitor, Zocor - muscle aches Failed Crestor 5 mg twice weekly, Crestor 20 mg daily, Pravastatin 40 mg qd, Lipitor, Zocor - muscle aches      Medication List    TAKE these medications   AMBULATORY NON FORMULARY MEDICATION Take 180 mg by mouth daily. Medication Name: bempedoic acid vs placebo. CLEAR Research study drug provided   amLODipine 5 MG tablet Commonly known as:  NORVASC TAKE 1 TABLET BY MOUTH DAILY   aspirin EC 81 MG tablet Take 1 tablet (81 mg total) by mouth daily.   colchicine 0.6 MG tablet Take 1 tablet (0.6  mg total) by mouth daily. Start taking on:  11/28/2017   cyclobenzaprine 10 MG tablet Commonly known as:  FLEXERIL TAKE 1 TABLET BY MOUTH AT BEDTIME.   gabapentin 100 MG capsule Commonly known as:  NEURONTIN Take 2 capsules (200 mg total) by mouth at bedtime.   lisinopril 10 MG tablet Commonly known as:  PRINIVIL,ZESTRIL Take 1 tablet (10 mg total) by mouth daily.   meloxicam 7.5 MG tablet Commonly known as:  MOBIC Take 2 tablets (15 mg total) by mouth daily. Take 1-2 tablets daily by mouth for severe joint pains    metoprolol succinate 25 MG 24 hr tablet Commonly known as:  TOPROL-XL TAKE 1 TABLET BY MOUTH ONCE DAILY.   nitroGLYCERIN 0.4 MG SL tablet Commonly known as:  NITROSTAT Place 1 tablet (0.4 mg total) under the tongue every 5 (five) minutes as needed for chest pain.   pantoprazole 40 MG tablet Commonly known as:  PROTONIX Take 1 tablet (40 mg total) by mouth daily.   traMADol 50 MG tablet Commonly known as:  ULTRAM Take 50 mg by mouth every 6 (six) hours as needed for moderate pain.        DISCHARGE INSTRUCTIONS:  1. F/u with PCP in 1-2 weeks 2. F/u with cardiology in 2 weeks 3. Can use colchicine daily x 7 days as needed for musculoskeletal pain DIET:  Cardiac diet DISCHARGE CONDITION:  Stable ACTIVITY:  Activity as tolerated OXYGEN:  Home Oxygen: No.  Oxygen Delivery: room air DISCHARGE LOCATION:  home   If you experience worsening of your admission symptoms, develop shortness of breath, life threatening emergency, suicidal or homicidal thoughts you must seek medical attention immediately by calling 911 or calling your MD immediately  if symptoms less severe.  You Must read complete instructions/literature along with all the possible adverse reactions/side effects for all the Medicines you take and that have been prescribed to you. Take any new Medicines after you have completely understood and accpet all the possible adverse reactions/side effects.   Please note  You were cared for by a hospitalist during your hospital stay. If you have any questions about your discharge medications or the care you received while you were in the hospital after you are discharged, you can call the unit and asked to speak with the hospitalist on call if the hospitalist that took care of you is not available. Once you are discharged, your primary care physician will handle any further medical issues. Please note that NO REFILLS for any discharge medications will be authorized once you are  discharged, as it is imperative that you return to your primary care physician (or establish a relationship with a primary care physician if you do not have one) for your aftercare needs so that they can reassess your need for medications and monitor your lab values.    On the day of Discharge:  VITAL SIGNS:  Blood pressure (!) 136/95, pulse 75, temperature 97.7 F (36.5 C), temperature source Oral, resp. rate 14, height 5\' 6"  (1.676 m), weight 78.2 kg, SpO2 96 %. PHYSICAL EXAMINATION:  GENERAL:  67 y.o.-year-old patient lying in the bed with no acute distress.  EYES: Pupils equal, round, reactive to light and accommodation. No scleral icterus. Extraocular muscles intact.  HEENT: Head atraumatic, normocephalic. Oropharynx and nasopharynx clear.  NECK:  Supple, no jugular venous distention. No thyroid enlargement, no tenderness.  LUNGS: Normal breath sounds bilaterally, no wheezing, rales,rhonchi or crepitation. No use of accessory muscles of respiration.  CARDIOVASCULAR: S1,  S2 normal. No murmurs, rubs, or gallops.  ABDOMEN: Soft, non-tender, non-distended. Bowel sounds present. No organomegaly or mass.  EXTREMITIES: No pedal edema, cyanosis, or clubbing.  NEUROLOGIC: Cranial nerves II through XII are intact. Muscle strength 5/5 in all extremities. Sensation intact. Gait not checked.  PSYCHIATRIC: The patient is alert and oriented x 3.  SKIN: No obvious rash, lesion, or ulcer.  DATA REVIEW:   CBC Recent Labs  Lab 11/25/17 0602  WBC 6.1  HGB 13.9  HCT 40.5  PLT 235    Chemistries  Recent Labs  Lab 11/25/17 0602  NA 143  K 4.1  CL 106  CO2 25  GLUCOSE 99  BUN 23  CREATININE 0.77  CALCIUM 8.7*     Microbiology Results  No results found for this or any previous visit.  RADIOLOGY:  No results found.   Management plans discussed with the patient, family and they are in agreement.  CODE STATUS: Full Code   TOTAL TIME TAKING CARE OF THIS PATIENT: 33 minutes.     Berna Spare Mayo M.D on 11/27/2017 at 4:54 PM  Between 7am to 6pm - Pager - [phone]  After 6pm go to [email or web address] - Proofreader  [facility]  Office  [phone]  CC: Primary care physician; Glean Hess, MD   Note: This dictation was prepared with Dragon dictation along with smaller phrase technology. Any transcriptional errors that result from this process are unintentional.

## 2017-11-27 NOTE — Telephone Encounter (Signed)
-----   Message from Wellington Hampshire, MD sent at 11/27/2017 10:48 AM EDT ----- This patient needs follow-up in our office in 1 to 2 weeks with Dr. Saunders Revel.  He is being discharged home today from [facility]  He used to see Dr. Marlou Porch but he is moving to [facility] and wants to establish in our office.

## 2017-11-27 NOTE — Telephone Encounter (Signed)
Patient currently admitted at this time. 

## 2017-11-27 NOTE — Interval H&P Note (Signed)
Cath Lab Visit (complete for each Cath Lab visit)  Clinical Evaluation Leading to the Procedure:   ACS: No.  Non-ACS:    Anginal Classification: CCS III  Anti-ischemic medical therapy: Minimal Therapy (1 class of medications)  Non-Invasive Test Results: High-risk stress test findings: cardiac mortality >3%/year  Prior CABG: No previous CABG      History and Physical Interval Note:  11/27/2017 9:59 AM  [name]  has presented today for surgery, with the diagnosis of Unstable angina, abnormal stress test  The various methods of treatment have been discussed with the patient and family. After consideration of risks, benefits and other options for treatment, the patient has consented to  Procedure(s): LEFT HEART CATH AND CORONARY ANGIOGRAPHY (N/A) as a surgical intervention .  The patient's history has been reviewed, patient examined, no change in status, stable for surgery.  I have reviewed the patient's chart and labs.  Questions were answered to the patient's satisfaction.     Kathlyn Sacramento

## 2017-11-27 NOTE — Discharge Instructions (Signed)
It was a pleasure meeting you during your hospitalization!  You came in to the hospital because you were having chest pain. You had a cardiac catheterization that just showed some mild narrowing of the arteries in your heart. Your chest pain may be caused by a muscle strain. I have prescribed some colchicine for you to take once daily for the next 7 days.  Please follow-up with your primary care physician and Dr. Saunders Revel in the next couple of weeks.  -Dr. Brett Albino

## 2017-11-27 NOTE — Plan of Care (Signed)
  Problem: Activity: Goal: Risk for activity intolerance will decrease Outcome: Progressing Note:  Up independently in room, tolerating well, wife at bedside   Problem: Nutrition: Goal: Adequate nutrition will be maintained Outcome: Progressing   Problem: Coping: Goal: Level of anxiety will decrease Outcome: Progressing   Problem: Pain Managment: Goal: General experience of comfort will improve Outcome: Progressing Note:  No complaints of pain this shift   Problem: Safety: Goal: Ability to remain free from injury will improve Outcome: Progressing   Problem: Skin Integrity: Goal: Risk for impaired skin integrity will decrease Outcome: Progressing   Problem: Activity: Goal: Ability to tolerate increased activity will improve Outcome: Progressing   Problem: Cardiac: Goal: Ability to achieve and maintain adequate cardiovascular perfusion will improve Outcome: Progressing Note:  No chest pain, scheduled for cardiac cath today   Problem: Education: Goal: Knowledge of General Education information will improve Description Including pain rating scale, medication(s)/side effects and non-pharmacologic comfort measures Outcome: Completed/Met

## 2017-11-27 NOTE — Telephone Encounter (Addendum)
° °  TCM....  Patient is being discharged   They saw Dr. Saunders Revel   They are scheduled to see End on 10/10  At 340 pm   They were seen for chest pain cath   They need to be seen within 1-2 weeks

## 2017-11-28 ENCOUNTER — Telehealth: Payer: Self-pay

## 2017-11-28 ENCOUNTER — Encounter: Payer: Self-pay | Admitting: Cardiovascular Disease

## 2017-11-28 ENCOUNTER — Telehealth: Payer: Self-pay | Admitting: Internal Medicine

## 2017-11-28 ENCOUNTER — Other Ambulatory Visit: Payer: Self-pay

## 2017-11-28 NOTE — Telephone Encounter (Signed)
° °  [facility] Pre-operative Risk Assessment    Request for surgical clearance:  1. What type of surgery is being performed? Cataract with IOL    2. When is this surgery scheduled? 12/04/17 (Monday)    3. What type of clearance is required (medical clearance vs. Pharmacy clearance to hold med vs. Both)?   4. Are there any medications that need to be held prior to surgery and how [place]?  5. Practice name and name of physician performing surgery? [facility] Dr Alma Friendly   6. What is your office phone number [phone]    7.   What is your office fax number [phone]  8.   Anesthesia type (None, local, MAC, general) ? Topical

## 2017-11-28 NOTE — Telephone Encounter (Signed)
Patient recently hospitalized from 11/24/17-11/27/17. Consult by Dr. Saunders Revel on 11/25/17. Former patient of Dr. Marlou Porch.  Left heart cath done 11/27/17 by Dr. Fletcher Anon:  "Recommendations: The patient's chest pain does not seem to be cardiac. He does have cardiomyopathy which seems to be nonischemic.  Continue treatment with a beta-blocker and ACE inhibitor.  Uptitrate as tolerated."   To Dr. Saunders Revel to review for clearance for pending cataract surgery on 12/04/17.

## 2017-11-28 NOTE — Telephone Encounter (Signed)
Pt needs referral put in to be seen at [facility]. Has appt on 12/08/17 for hospital f/u will discuss then

## 2017-11-28 NOTE — Telephone Encounter (Signed)
Patient contacted regarding discharge from [facility] on 11/27/17.  Patient understands to follow up with provider Dr. Saunders Revel on Thursday 12/07/17 at 3:40 pm at the [facility] office. Patient understands discharge instructions? Yes Patient understands medications and regiment? Yes Patient understands to bring all medications to this visit? Yes

## 2017-11-28 NOTE — Telephone Encounter (Signed)
TOC #1. Called pt to f/u after d/c from [facility] on 11/27/17. Also wanted to confirm the patient's hosp f/u appt w/ Dr. Army Melia on 12/08/17 @ 2:20pm. Discharge planning includes the following:  - Begin colchicine - Resume heart healthy diet - F/u PCP 1-2 weeks - F/u cardiology in 2 weeks  LVM requesting returned call.

## 2017-11-28 NOTE — Telephone Encounter (Signed)
Clearance and recommendations faxed via Epic to Dr. Alma Friendly at [facility]

## 2017-11-28 NOTE — Telephone Encounter (Signed)
OK to proceed with surgery.  Continue aspirin during perioperative period.  Nelva Bush, MD [facility] Pager: [phone]

## 2017-11-29 ENCOUNTER — Telehealth: Payer: Self-pay

## 2017-11-29 NOTE — Telephone Encounter (Signed)
TOC #2. Returned pt call after 1st attempt made 11/28/17. Calling to f/u after d/c from [facility] on 11/27/17. Also wanted to confirm the patient's hosp f/u appt w/ Dr. Army Melia on 12/08/17 @ 2:20pm. Discharge planning includes the following:  - Begin colchicine - Resume heart healthy diet - F/u PCP 1-2 weeks - F/u cardiology in 2 weeks  Again, LVM requesting returned call.

## 2017-11-29 NOTE — Anesthesia Preprocedure Evaluation (Addendum)
Anesthesia Evaluation  Patient identified by MRN, date of birth, ID band Patient awake    Reviewed: Allergy & Precautions, H&P , NPO status , Patient's Chart, lab work & pertinent test results  Airway Mallampati: II  TM Distance: >3 FB Neck ROM: full    Dental no notable dental hx.    Pulmonary former smoker (quit 2007),    Pulmonary exam normal breath sounds clear to auscultation       Cardiovascular hypertension, + CAD, + Past MI (2009), + Cardiac Stents (2017) and + Peripheral Vascular Disease (carotid stenosis)  Normal cardiovascular exam Rhythm:regular Rate:Normal  Left heart cath 11/27/17 by Dr. Fletcher Anon: Recommendations: The patient's chest pain does not seem to be cardiac. He does have cardiomyopathy which seems to be nonischemic. Continue treatment with a beta-blocker and ACE inhibitor. Uptitrate as tolerated.  ECG 11/26/17: SR, PVCs, LBBB  Myocardial perfusion 11/25/17:  Abnormal, potentially high risk pharmacologic myocardial perfusion stress test. There is a large in size, mild to moderately severe, minimally reversible defect involving the septum and anterior walls, as well as the apex. While this may represent artifact from LBBB, anterior and apical involvement raises the possibility of scar with periinfarct ischemia. The left ventricular ejection fraction is severely decreased (29%) with global hypokinesis and a dyskinetic septum.   Neuro/Psych    GI/Hepatic hiatal hernia, GERD  ,Esophageal stenosis   Endo/Other    Renal/GU      Musculoskeletal  (+) Arthritis ,   Abdominal   Peds  Hematology   Anesthesia Other Findings Cardiac clearance 11/28/17:  OK to proceed with surgery.  Continue aspirin during perioperative period. Nelva Bush, MD [facility] Pager: [phone]  Reproductive/Obstetrics                            Anesthesia Physical Anesthesia Plan  ASA:  III  Anesthesia Plan: MAC   Post-op Pain Management:    Induction:   PONV Risk Score and Plan: 1 and Midazolam and Treatment may vary due to age or medical condition  Airway Management Planned:   Additional Equipment:   Intra-op Plan:   Post-operative Plan:   Informed Consent: I have reviewed the patients History and Physical, chart, labs and discussed the procedure including the risks, benefits and alternatives for the proposed anesthesia with the patient or authorized representative who has indicated his/her understanding and acceptance.     Plan Discussed with: CRNA  Anesthesia Plan Comments:         Anesthesia Quick Evaluation

## 2017-12-01 NOTE — Discharge Instructions (Signed)

## 2017-12-04 ENCOUNTER — Ambulatory Visit
Admission: RE | Admit: 2017-12-04 | Discharge: 2017-12-04 | Disposition: A | Discharge status: Home / Self Care | Payer: PPO | Source: Ambulatory Visit | Attending: Ophthalmology | Admitting: Ophthalmology

## 2017-12-04 ENCOUNTER — Ambulatory Visit: Payer: PPO | Admitting: Anesthesiology

## 2017-12-04 ENCOUNTER — Encounter
Admission: RE | Disposition: A | Discharge status: Home / Self Care | Payer: Self-pay | Source: Ambulatory Visit | Attending: Ophthalmology

## 2017-12-04 DIAGNOSIS — Z79899 Other long term (current) drug therapy: Secondary | ICD-10-CM | POA: Diagnosis not present

## 2017-12-04 DIAGNOSIS — K219 Gastro-esophageal reflux disease without esophagitis: Secondary | ICD-10-CM | POA: Diagnosis not present

## 2017-12-04 DIAGNOSIS — Z87891 Personal history of nicotine dependence: Secondary | ICD-10-CM | POA: Diagnosis not present

## 2017-12-04 DIAGNOSIS — I252 Old myocardial infarction: Secondary | ICD-10-CM | POA: Diagnosis not present

## 2017-12-04 DIAGNOSIS — I739 Peripheral vascular disease, unspecified: Secondary | ICD-10-CM | POA: Diagnosis not present

## 2017-12-04 DIAGNOSIS — I10 Essential (primary) hypertension: Secondary | ICD-10-CM | POA: Insufficient documentation

## 2017-12-04 DIAGNOSIS — Z955 Presence of coronary angioplasty implant and graft: Secondary | ICD-10-CM | POA: Diagnosis not present

## 2017-12-04 DIAGNOSIS — Z7982 Long term (current) use of aspirin: Secondary | ICD-10-CM | POA: Insufficient documentation

## 2017-12-04 DIAGNOSIS — Z791 Long term (current) use of non-steroidal anti-inflammatories (NSAID): Secondary | ICD-10-CM | POA: Insufficient documentation

## 2017-12-04 DIAGNOSIS — H25812 Combined forms of age-related cataract, left eye: Secondary | ICD-10-CM | POA: Diagnosis not present

## 2017-12-04 DIAGNOSIS — I2510 Atherosclerotic heart disease of native coronary artery without angina pectoris: Secondary | ICD-10-CM | POA: Insufficient documentation

## 2017-12-04 DIAGNOSIS — H2512 Age-related nuclear cataract, left eye: Secondary | ICD-10-CM | POA: Insufficient documentation

## 2017-12-04 HISTORY — PX: CATARACT EXTRACTION W/ INTRAOCULAR LENS IMPLANT: SHX1309

## 2017-12-04 HISTORY — PX: CATARACT EXTRACTION W/PHACO: SHX586

## 2017-12-04 SURGERY — PHACOEMULSIFICATION, CATARACT, WITH IOL INSERTION
Anesthesia: Monitor Anesthesia Care | Laterality: Left

## 2017-12-04 MED ORDER — ACETAMINOPHEN 325 MG PO TABS: 325.0000 mg | ORAL_TABLET | Freq: Once | ORAL | Status: DC

## 2017-12-04 MED ORDER — ARMC OPHTHALMIC DILATING DROPS
1.0000 "application " | OPHTHALMIC | Status: DC | PRN
  Administered 2017-12-04 (×3): 1 via OPHTHALMIC

## 2017-12-04 MED ORDER — ACETAMINOPHEN 160 MG/5ML PO SOLN: 325.0000 mg | Freq: Once | ORAL | Status: DC

## 2017-12-04 MED ORDER — LACTATED RINGERS IV SOLN: 1000.0000 mL | INTRAVENOUS | Status: DC

## 2017-12-04 MED ORDER — FENTANYL CITRATE (PF) 100 MCG/2ML IJ SOLN
INTRAMUSCULAR | Status: DC | PRN
  Administered 2017-12-04 (×2): 50 ug via INTRAVENOUS

## 2017-12-04 MED ORDER — MIDAZOLAM HCL 2 MG/2ML IJ SOLN
INTRAMUSCULAR | Status: DC | PRN
  Administered 2017-12-04: 2 mg via INTRAVENOUS

## 2017-12-04 MED ORDER — SODIUM HYALURONATE 23 MG/ML IO SOLN
INTRAOCULAR | Status: DC | PRN
  Administered 2017-12-04: 0.6 mL via INTRAOCULAR

## 2017-12-04 MED ORDER — LIDOCAINE HCL (PF) 2 % IJ SOLN
INTRAOCULAR | Status: DC | PRN
  Administered 2017-12-04: 1 mL via INTRAOCULAR

## 2017-12-04 MED ORDER — SODIUM HYALURONATE 10 MG/ML IO SOLN
INTRAOCULAR | Status: DC | PRN
  Administered 2017-12-04: 0.55 mL via INTRAOCULAR

## 2017-12-04 MED ORDER — TETRACAINE HCL 0.5 % OP SOLN
1.0000 [drp] | OPHTHALMIC | Status: DC | PRN
  Administered 2017-12-04 (×3): 1 [drp] via OPHTHALMIC

## 2017-12-04 MED ORDER — EPINEPHRINE PF 1 MG/ML IJ SOLN
INTRAOCULAR | Status: DC | PRN
  Administered 2017-12-04: 78 mL via OPHTHALMIC

## 2017-12-04 SURGICAL SUPPLY — 17 items
CANNULA ANT/CHMB 27G (MISCELLANEOUS) ×1 IMPLANT
CANNULA ANT/CHMB 27GA (MISCELLANEOUS) ×2 IMPLANT
DISSECTOR HYDRO NUCLEUS 50X22 (MISCELLANEOUS) ×2 IMPLANT
GLOVE BIO SURGEON STRL SZ8 (GLOVE) ×2 IMPLANT
GLOVE SURG LX 7.5 STRW (GLOVE) ×1
GLOVE SURG LX STRL 7.5 STRW (GLOVE) ×1 IMPLANT
GOWN STRL REUS W/ TWL LRG LVL3 (GOWN DISPOSABLE) ×2 IMPLANT
GOWN STRL REUS W/TWL LRG LVL3 (GOWN DISPOSABLE) ×4
LENS IOL TECNIS ITEC 20.5 (Intraocular Lens) ×1 IMPLANT
MARKER SKIN DUAL TIP RULER LAB (MISCELLANEOUS) ×2 IMPLANT
PACK DR. KING ARMS (PACKS) ×2 IMPLANT
PACK EYE AFTER SURG (MISCELLANEOUS) ×2 IMPLANT
PACK OPTHALMIC (MISCELLANEOUS) ×2 IMPLANT
SYR 3ML LL SCALE MARK (SYRINGE) ×2 IMPLANT
SYR TB 1ML LUER SLIP (SYRINGE) ×2 IMPLANT
WATER STERILE IRR 500ML POUR (IV SOLUTION) ×2 IMPLANT
WIPE NON LINTING 3.25X3.25 (MISCELLANEOUS) ×2 IMPLANT

## 2017-12-04 NOTE — Op Note (Signed)
OPERATIVE NOTE  [name] [id number] 12/04/2017   PREOPERATIVE DIAGNOSIS:  Nuclear sclerotic cataract left eye.  H25.12   POSTOPERATIVE DIAGNOSIS:    Nuclear sclerotic cataract left eye.     PROCEDURE:  Phacoemusification with posterior chamber intraocular lens placement of the left eye   LENS:   Implant Name Type Inv. Item Serial No. Manufacturer Lot No. LRB No. Used  LENS IOL DIOP 20.5 - [id number] Intraocular Lens LENS IOL DIOP 20.5 [id number] AMO  Left 1       PCB00 +20.5   ULTRASOUND TIME: 0 minutes 26 seconds.  CDE 1.45   SURGEON:  Benay Pillow, MD, MPH   ANESTHESIA:  Topical with tetracaine drops augmented with 1% preservative-free intracameral lidocaine.  ESTIMATED BLOOD LOSS: <1 mL   COMPLICATIONS:  None.   DESCRIPTION OF PROCEDURE:  The patient was identified in the holding room and transported to the operating room and placed in the supine position under the operating microscope.  The left eye was identified as the operative eye and it was prepped and draped in the usual sterile ophthalmic fashion.   A 1.0 millimeter clear-corneal paracentesis was made at the 5:00 position. 0.5 ml of preservative-free 1% lidocaine with epinephrine was injected into the anterior chamber.  The anterior chamber was filled with Healon 5 viscoelastic.  A 2.4 millimeter keratome was used to make a near-clear corneal incision at the 2:00 position.  A curvilinear capsulorrhexis was made with a cystotome and capsulorrhexis forceps.  Balanced salt solution was used to hydrodissect and hydrodelineate the nucleus.   Phacoemulsification was then used in stop and chop fashion to remove the lens nucleus and epinucleus.  The remaining cortex was then removed using the irrigation and aspiration handpiece. Healon was then placed into the capsular bag to distend it for lens placement.  A lens was then injected into the capsular bag.  The remaining viscoelastic was aspirated.   Wounds were hydrated with  balanced salt solution.  The anterior chamber was inflated to a physiologic pressure with balanced salt solution.  Intracameral vigamox 0.1 mL undiltued was injected into the eye and a drop placed onto the ocular surface.  No wound leaks were noted.  The patient was taken to the recovery room in stable condition without complications of anesthesia or surgery  Benay Pillow 12/04/2017, 12:27 PM

## 2017-12-04 NOTE — Anesthesia Postprocedure Evaluation (Signed)
Anesthesia Post Note  Patient: [name]  Procedure(s) Performed: CATARACT EXTRACTION PHACO AND INTRAOCULAR LENS PLACEMENT (IOC) LEFT (Left )  Patient location during evaluation: PACU Anesthesia Type: MAC Level of consciousness: awake and alert and oriented Pain management: satisfactory to patient Vital Signs Assessment: post-procedure vital signs reviewed and stable Respiratory status: spontaneous breathing, nonlabored ventilation and respiratory function stable Cardiovascular status: blood pressure returned to baseline and stable Postop Assessment: Adequate PO intake and No signs of nausea or vomiting Anesthetic complications: no    Raliegh Ip

## 2017-12-04 NOTE — Anesthesia Procedure Notes (Signed)
Procedure Name: MAC Date/Time: 12/04/2017 12:03 PM Performed by: Janna Arch, CRNA Pre-anesthesia Checklist: Patient identified, Emergency Drugs available, Suction available, Timeout performed and Patient being monitored Patient Re-evaluated:Patient Re-evaluated prior to induction Oxygen Delivery Method: Nasal cannula Placement Confirmation: positive ETCO2

## 2017-12-04 NOTE — Transfer of Care (Signed)
Immediate Anesthesia Transfer of Care Note  Patient: [name]  Procedure(s) Performed: CATARACT EXTRACTION PHACO AND INTRAOCULAR LENS PLACEMENT (IOC) LEFT (Left )  Patient Location: PACU  Anesthesia Type: MAC  Level of Consciousness: awake, alert  and patient cooperative  Airway and Oxygen Therapy: Patient Spontanous Breathing and Patient connected to supplemental oxygen  Post-op Assessment: Post-op Vital signs reviewed, Patient's Cardiovascular Status Stable, Respiratory Function Stable, Patent Airway and No signs of Nausea or vomiting  Post-op Vital Signs: Reviewed and stable  Complications: No apparent anesthesia complications

## 2017-12-04 NOTE — H&P (Signed)
The History and Physical notes are on paper, have been signed, and are to be scanned.   I have examined the patient and there are no changes to the H&P.   [name] 12/04/2017 11:54 AM

## 2017-12-06 ENCOUNTER — Encounter: Payer: Self-pay | Admitting: Ophthalmology

## 2017-12-07 ENCOUNTER — Ambulatory Visit: Payer: PPO | Admitting: Internal Medicine

## 2017-12-07 ENCOUNTER — Encounter: Payer: Self-pay | Admitting: Internal Medicine

## 2017-12-07 VITALS — BP 136/82 | Ht 66.0 in | Wt 178.2 lb

## 2017-12-07 DIAGNOSIS — I6529 Occlusion and stenosis of unspecified carotid artery: Secondary | ICD-10-CM

## 2017-12-07 DIAGNOSIS — I2510 Atherosclerotic heart disease of native coronary artery without angina pectoris: Secondary | ICD-10-CM | POA: Diagnosis not present

## 2017-12-07 DIAGNOSIS — I10 Essential (primary) hypertension: Secondary | ICD-10-CM

## 2017-12-07 DIAGNOSIS — E785 Hyperlipidemia, unspecified: Secondary | ICD-10-CM

## 2017-12-07 DIAGNOSIS — I428 Other cardiomyopathies: Secondary | ICD-10-CM | POA: Diagnosis not present

## 2017-12-07 DIAGNOSIS — Z79899 Other long term (current) drug therapy: Secondary | ICD-10-CM

## 2017-12-07 DIAGNOSIS — I739 Peripheral vascular disease, unspecified: Secondary | ICD-10-CM | POA: Diagnosis not present

## 2017-12-07 MED ORDER — AMLODIPINE BESYLATE 5 MG PO TABS: 5.0000 mg | ORAL_TABLET | Freq: Every day | ORAL | 3 refills | Status: DC

## 2017-12-07 MED ORDER — LISINOPRIL 20 MG PO TABS: 20.0000 mg | ORAL_TABLET | Freq: Every day | ORAL | 3 refills | Status: DC

## 2017-12-07 MED ORDER — METOPROLOL SUCCINATE ER 25 MG PO TB24: 25.0000 mg | ORAL_TABLET | Freq: Every day | ORAL | 3 refills | Status: DC

## 2017-12-07 NOTE — Progress Notes (Signed)
Follow-up Outpatient Visit Date: 12/07/2017  Primary Care Provider: Glean Hess, MD [address] [place] [address]  Chief Complaint: Follow-up cardiomyopathy  HPI:  [name] is a 67 y.o. year-old male with history of coronary artery disease with NSTEMI and PCI to LCx in 07/2015, hypertension, hyperlipidemia, carotid artery stenosis, hiatal hernia, and peptic ulcer disease, who presents for follow-up of coronary artery disease.  He was admitted to [facility] last month with atypical chest pain.  Echocardiogram and myocardial perfusion stress test showed reduced LVEF and fixed predominantly septal defect in the setting of LBBB.  Cardiac catheterization revealed patent LCx stent with otherwise mild to moderate nonobstructive CAD.  [name] was previously followed in our practice by Dr. Marlou Porch but recently moved to [facility] and would like to establish care in the [place] office.  Today, [name] reports feeling great.  He noted mild chest pain the day after being discharged; this has now resolved.  He is no longer taking colchicine.  He underwent successful left cataract surgery earlier in the week.  He denies shortness of breath, lightheadedness, palpitations, orthopnea, PND, and edema.  He remains compliant with his medications.  His biggest limitation is pain in his legs.  He attributes some of this to chronic left him pain.  However, he also gets aching in both feet when walking > 200 yards.  ABI's have been ordered through Dr. Luther Parody office in [place].  --------------------------------------------------------------------------------------------------  Past Medical History:  Diagnosis Date  . Abnormal nuclear cardiac imaging test 08/08/2015  . Arthritis   . Carotid artery occlusion   . Coronary atherosclerosis of native coronary artery 01/29/2013  . Duodenal erosion   . Encounter for screening for lung cancer 07/13/2016  . Esophageal stenosis   . GERD (gastroesophageal  reflux disease)   . H. pylori infection   . Heart attack [facility]) Oct. 2009   Mild  . Hiatal hernia   . Hyperlipidemia   . Hypertension   . 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 Cardiac catheterization-nonobstructive 60% RCA disease-subsequent nuclear stress test-9 minutes, low risk, mild inferior wall hypokinesis   . Shortness of breath dyspnea    Past Surgical History:  Procedure Laterality Date  . APPENDECTOMY    . CARDIAC CATHETERIZATION N/A 08/07/2015   Procedure: Left Heart Cath and Coronary Angiography;  Surgeon: [name], MD;  Location: [place] [facility];  Service: Cardiovascular;  Laterality: N/A;  . CARDIAC CATHETERIZATION N/A 08/07/2015   Procedure: Coronary Stent Intervention;  Surgeon: [name], MD;  Location: [place] [facility];  Service: Cardiovascular;  Laterality: N/A;  . CARDIAC CATHETERIZATION N/A 08/07/2015   Procedure: Coronary Stent Intervention;  Surgeon: [name], MD;  Location: [place] [facility];  Service: Cardiovascular;  Laterality: N/A;  . CAROTID ENDARTERECTOMY  01/05/2006   Right  CEA with DPA  . CATARACT EXTRACTION W/ INTRAOCULAR LENS IMPLANT Left 12/04/2017  . CATARACT EXTRACTION W/PHACO Left 12/04/2017   Procedure: CATARACT EXTRACTION PHACO AND INTRAOCULAR LENS PLACEMENT ([place]) LEFT;  Surgeon: Eulogio Bear, MD;  Location: [place];  Service: Ophthalmology;  Laterality: Left;  . CORONARY STENT PLACEMENT  08/07/2015   [facility] [place]  . HIP SURGERY Left 10/2016   left hip tendon repair  . LEFT HEART CATH AND CORONARY ANGIOGRAPHY N/A 11/27/2017   Procedure: LEFT HEART CATH AND CORONARY ANGIOGRAPHY;  Surgeon: Wellington Hampshire, MD;  Location: [place] [facility];  Service: Cardiovascular;  Laterality: N/A;  . SPINE SURGERY      Current Meds  Medication Sig  . amLODipine (NORVASC) 5 MG tablet Take 1 tablet (5 mg total) by mouth daily.  Marland Kitchen aspirin EC 81 MG tablet Take 1  tablet (81 mg total) by mouth daily.  . colchicine 0.6 MG tablet Take 1 tablet (0.6 mg total) by mouth daily.  . cyclobenzaprine (FLEXERIL) 10 MG tablet TAKE 1 TABLET BY MOUTH AT BEDTIME.  Marland Kitchen gabapentin (NEURONTIN) 100 MG capsule Take 2 capsules (200 mg total) by mouth at bedtime.  . meloxicam (MOBIC) 7.5 MG tablet Take 2 tablets (15 mg total) by mouth daily. Take 1-2 tablets daily by mouth for severe joint pains  . metoprolol succinate (TOPROL-XL) 25 MG 24 hr tablet Take 1 tablet (25 mg total) by mouth daily.  . nitroGLYCERIN (NITROSTAT) 0.4 MG SL tablet Place 1 tablet (0.4 mg total) under the tongue every 5 (five) minutes as needed for chest pain.  . pantoprazole (PROTONIX) 40 MG tablet Take 1 tablet (40 mg total) by mouth daily.  . traMADol (ULTRAM) 50 MG tablet Take 50 mg by mouth every 6 (six) hours as needed for moderate pain.   . [DISCONTINUED] amLODipine (NORVASC) 5 MG tablet TAKE 1 TABLET BY MOUTH DAILY  . [DISCONTINUED] lisinopril (PRINIVIL,ZESTRIL) 10 MG tablet Take 1 tablet (10 mg total) by mouth daily.  . [DISCONTINUED] metoprolol succinate (TOPROL-XL) 25 MG 24 hr tablet TAKE 1 TABLET BY MOUTH ONCE DAILY.    Allergies: Brilinta [ticagrelor]; Zetia [ezetimibe]; and Statins  Social History   Tobacco Use  . Smoking status: Former Smoker    Packs/day: 1.25    Years: 35.00    Pack years: 43.75    Types: Cigarettes    Last attempt to quit: 02/28/2005    Years since quitting: 12.7  . Smokeless tobacco: Current User    Types: Snuff  Substance Use Topics  . Alcohol use: Yes    Alcohol/week: 8.0 - 10.0 standard drinks    Types: 8 - 10 Glasses of wine per week  . Drug use: No    Family History  Problem Relation Age of Onset  . Heart attack Mother   . Coronary artery disease Mother   . Heart disease Mother        Carotid Stenosis and BPG and Heart Disease before age 50  . Diabetes Mother   . Hypertension Mother   . Heart attack Father   . Heart disease Father        BPG and  Heart Disease before age 39  . Hypertension Father   . Cancer Father 55       throat  . Stroke Father   . Colon cancer Neg Hx   . Colon polyps Neg Hx   . Esophageal cancer Neg Hx   . Rectal cancer Neg Hx   . Stomach cancer Neg Hx     Review of Systems: A 12-system review of systems was performed and was negative except as noted in the HPI.  --------------------------------------------------------------------------------------------------  Physical Exam: BP 136/82 (BP Location: Left Arm, Patient Position: Sitting, Cuff Size: Normal)   Ht 5\' 6"  (1.676 m)   Wt 178 lb 4 oz (80.9 kg)   BMI 28.77 kg/m   General:  NAD. HEENT: No conjunctival pallor or scleral icterus. Moist mucous membranes.  OP clear. Neck: Supple without lymphadenopathy, thyromegaly, JVD, or HJR. No carotid bruit. Lungs: Normal work of breathing. Clear to auscultation bilaterally without wheezes  or crackles. Heart: Regular rate and rhythm without murmurs, rubs, or gallops. Non-displaced PMI. Abd: Bowel sounds present. Soft, NT/ND without hepatosplenomegaly Ext: No lower extremity edema. Trace pedal pulses bilaterally.  Radial pulses 2+ bilaterally; right radial arteriotomy is well-healed. Skin: Warm and dry without rash.  EKG:  NSR with LBBB.  No significant change.  Lab Results  Component Value Date   WBC 6.1 11/25/2017   HGB 13.9 11/25/2017   HCT 40.5 11/25/2017   MCV 92.2 11/25/2017   PLT 235 11/25/2017    Lab Results  Component Value Date   NA 143 11/25/2017   K 4.1 11/25/2017   CL 106 11/25/2017   CO2 25 11/25/2017   BUN 23 11/25/2017   CREATININE 0.77 11/25/2017   GLUCOSE 99 11/25/2017   ALT 30 09/06/2017    Lab Results  Component Value Date   CHOL 179 11/25/2017   HDL 43 11/25/2017   LDLCALC 106 (H) 11/25/2017   TRIG 150 (H) 11/25/2017   CHOLHDL 4.2 11/25/2017    --------------------------------------------------------------------------------------------------  ASSESSMENT AND  PLAN: Coronary artery disease without angina No further chest pain.  Catheterization was reassuring with patent LCx stent and no other significant disease.  Continue aggressive secondary prevention.  Nonischemic cardiomyopathy LVEF noted to be mildly to moderately reduced during hospitalization in the setting of left bundle branch block.  Mr. Mehring appears euvolemic and well compensated with NYHA class II symptoms (leg pain is his greatest limiting factor).  We have agreed to continue current dose of metoprolol and to increase lisinopril to 20 mg daily.  We will plan to recheck a basic metabolic panel in about 2 weeks.  I will also repeat an echocardiogram shortly before he returns to see me in 3 months to see if his LVEF has improved with escalation of evidence-based heart failure therapy.  Carotid artery stenosis Patient has followed in the past with [facility] in [place] but would like to transition to a provider in [place].  I will refer him to [facility] for ongoing management of his cerebrovascular disease.  Leg pain This is likely multifactorial including arthritis related to left hip issues.  He also notes aching in both distal extremities when walking 200 yards, concerning for claudication.  ABIs were previously ordered through [facility] in [place].  As above, I will refer him to [facility] here in [facility] for further work-up and management.  Hyperlipidemia Patient intolerant of multiple statins.  He should continue follow-up with our research coordinators as part of the CLEAR trial.  Goal LDL less than 70.  Hypertension Blood pressure upper normal.  As above, we will increase lisinopril with repeat BMP in about 2 weeks.  Follow-up: Return to clinic in 3 months.  Nelva Bush, MD 12/08/2017 8:01 AM

## 2017-12-07 NOTE — Patient Instructions (Signed)
Medication Instructions:  Your physician has recommended you make the following change in your medication:  1- INCREASE Lisinopril to 20 mg by mouth once a day.  If you need a refill on your cardiac medications before your next appointment, please call your pharmacy.    Lab work: Your physician recommends that you return for lab work in: [place] (12/21/17) FOR BMET. Please go to the [facility]. You will check in at the front desk to the right as you walk into the atrium. Valet Parking is offered if needed.  If you have labs (blood work) drawn today and your tests are completely normal, you will receive your results only by: Marland Kitchen MyChart Message (if you have MyChart) OR . A paper copy in the mail If you have any lab test that is abnormal or we need to change your treatment, we will call you to review the results.    Testing/Procedures: IN 3 MONTHS PRIOR TO APPOINTMENT WITH DR END-  Your physician has requested that you have an echocardiogram. Echocardiography is a painless test that uses sound waves to create images of your heart. It provides your doctor with information about the size and shape of your heart and how well your heart's chambers and valves are working. This procedure takes approximately one hour. There are no restrictions for this procedure. You may get an IV, if needed, to receive an ultrasound enhancing agent through to better visualize your heart.     Follow-Up: You have been referred to [facility]. If you do not here anything in the next week, please call the number listed on this paperwork.   At [facility], you and your health needs are our priority.  As part of our continuing mission to provide you with exceptional heart care, we have created designated Provider Care Teams.  These Care Teams include your primary Cardiologist (physician) and Advanced Practice Providers (APPs -  Physician Assistants and Nurse Practitioners) who all work together  to provide you with the care you need, when you need it. You will need a follow up appointment in 3 months.  Please call our office 2 months in advance to schedule this appointment.  You may see Dr Harrell Gave End or one of the following Advanced Practice Providers on your designated Care Team:   Murray Hodgkins, NP Christell Faith, PA-C . Marrianne Mood, PA-C

## 2017-12-08 ENCOUNTER — Encounter: Payer: Self-pay | Admitting: Internal Medicine

## 2017-12-08 ENCOUNTER — Ambulatory Visit (INDEPENDENT_AMBULATORY_CARE_PROVIDER_SITE_OTHER): Payer: PPO | Admitting: Internal Medicine

## 2017-12-08 VITALS — BP 104/64 | HR 61 | Ht 66.0 in | Wt 177.0 lb

## 2017-12-08 DIAGNOSIS — M5134 Other intervertebral disc degeneration, thoracic region: Secondary | ICD-10-CM | POA: Diagnosis not present

## 2017-12-08 DIAGNOSIS — I428 Other cardiomyopathies: Secondary | ICD-10-CM | POA: Insufficient documentation

## 2017-12-08 DIAGNOSIS — M791 Myalgia, unspecified site: Secondary | ICD-10-CM

## 2017-12-08 DIAGNOSIS — I10 Essential (primary) hypertension: Secondary | ICD-10-CM

## 2017-12-08 DIAGNOSIS — I25118 Atherosclerotic heart disease of native coronary artery with other forms of angina pectoris: Secondary | ICD-10-CM | POA: Diagnosis not present

## 2017-12-08 DIAGNOSIS — T466X5A Adverse effect of antihyperlipidemic and antiarteriosclerotic drugs, initial encounter: Secondary | ICD-10-CM | POA: Insufficient documentation

## 2017-12-08 DIAGNOSIS — I420 Dilated cardiomyopathy: Secondary | ICD-10-CM | POA: Insufficient documentation

## 2017-12-08 MED ORDER — TRAMADOL HCL 50 MG PO TABS: 50.0000 mg | ORAL_TABLET | Freq: Four times a day (QID) | ORAL | 1 refills | Status: DC | PRN

## 2017-12-08 MED ORDER — CYCLOBENZAPRINE HCL 10 MG PO TABS: 10.0000 mg | ORAL_TABLET | Freq: Every day | ORAL | 0 refills | Status: DC

## 2017-12-08 NOTE — Progress Notes (Signed)
Date:  12/08/2017   Name:  [name]   DOB:  30-Aug-1950   MRN:  [id number]   Chief Complaint: Hospitalization Follow-up (Patient is feeling good since the hospital stay. Nothing new. )  Hypertension  This is a chronic problem. The problem is uncontrolled. Pertinent negatives include no chest pain, headaches, palpitations or shortness of breath. Past treatments include ACE inhibitors (lisinopril dose increased).   CAD - recently discharged from [facility] for chest pain.  He had a cath which showed stable disease with medication management recommended. He is in a trial medication for cholesterol - Bempidoic acid - since he can not tolerate statins.  Thoracic back pain - he has degen disc disease in his mid back.  He plays golf weekly and sometimes needs some tramadol and/or flexeril to keep moving.  He uses it only 1-2 times per week but will run out of medication before his next appt in March.  Review of Systems  Constitutional: Negative for chills and diaphoresis.  Respiratory: Negative for cough, chest tightness, shortness of breath and wheezing.   Cardiovascular: Negative for chest pain and palpitations.  Gastrointestinal: Negative for abdominal pain.  Musculoskeletal: Positive for arthralgias and back pain. Negative for joint swelling and myalgias.  Neurological: Negative for dizziness and headaches.  Psychiatric/Behavioral: Negative for agitation. The patient is not nervous/anxious.     Patient Active Problem List   Diagnosis Date Noted  . Atherosclerosis of native coronary artery of native heart without angina pectoris 12/08/2017  . Nonischemic cardiomyopathy ([place]) 12/08/2017  . Unstable angina ([place]) 11/25/2017  . Moderate risk chest pain 11/24/2017  . 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 Foot pain, bilateral- chronic many yrs 05/01/2017  . Disorder of tendon 03/01/2017  . Left-sided thoracic back pain 10/17/2016  . Tobacco use  disorder, moderate, in sustained remission 07/13/2016  . Overweight (BMI 25.0-29.9) 07/13/2016  . 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 Dysphagia 11/26/2015  . Chronic anticoagulation: plavix 10/15/2015  . Chronic left hip pain 09/15/2015  . Greater trochanteric bursitis of left hip 09/15/2015  . Coronary artery disease of native heart with stable angina pectoris ([place]) 08/08/2015  . Essential hypertension 08/08/2015  . Hyperlipidemia LDL goal <70 01/29/2013  . Carotid stenosis-s/p RCE 11/08/2011    Allergies  Allergen Reactions  . Brilinta [Ticagrelor]     Shortness of breath  . Zetia [Ezetimibe] Other (See Comments)    Muscle aches  . Statins Other (See Comments)    Failed Crestor 5 mg twice weekly, Crestor 20 mg daily, Pravastatin 40 mg qd, Lipitor, Zocor - muscle aches    Past Surgical History:  Procedure Laterality Date  . APPENDECTOMY    . CARDIAC CATHETERIZATION N/A 08/07/2015   Procedure: Left Heart Cath and Coronary Angiography;  Surgeon: [name], MD;  Location: [place] [facility];  Service: Cardiovascular;  Laterality: N/A;  . CARDIAC CATHETERIZATION N/A 08/07/2015   Procedure: Coronary Stent Intervention;  Surgeon: [name], MD;  Location: [place] [facility];  Service: Cardiovascular;  Laterality: N/A;  . CARDIAC CATHETERIZATION N/A 08/07/2015   Procedure: Coronary Stent Intervention;  Surgeon: [name], MD;  Location: [place] [facility];  Service: Cardiovascular;  Laterality: N/A;  . CAROTID ENDARTERECTOMY  01/05/2006   Right  CEA with DPA  . CATARACT EXTRACTION W/ INTRAOCULAR LENS IMPLANT Left 12/04/2017  . CATARACT EXTRACTION W/PHACO Left 12/04/2017   Procedure: CATARACT EXTRACTION PHACO AND INTRAOCULAR LENS PLACEMENT ([facility])  LEFT;  Surgeon: Eulogio Bear, MD;  Location: [place];  Service: Ophthalmology;  Laterality: Left;  . CORONARY STENT PLACEMENT  08/07/2015   MID [place]  . HIP SURGERY Left 10/2016   left hip tendon repair    . LEFT HEART CATH AND CORONARY ANGIOGRAPHY N/A 11/27/2017   Procedure: LEFT HEART CATH AND CORONARY ANGIOGRAPHY;  Surgeon: Wellington Hampshire, MD;  Location: [place] [facility];  Service: Cardiovascular;  Laterality: N/A;  . SPINE SURGERY      Social History   Tobacco Use  . Smoking status: Former Smoker    Packs/day: 1.25    Years: 35.00    Pack years: 43.75    Types: Cigarettes    Last attempt to quit: 02/28/2005    Years since quitting: 12.7  . Smokeless tobacco: Current User    Types: Snuff  Substance Use Topics  . Alcohol use: Yes    Alcohol/week: 8.0 - 10.0 standard drinks    Types: 8 - 10 Glasses of wine per week  . Drug use: No     Medication list has been reviewed and updated.  Current Meds  Medication Sig  . AMBULATORY NON FORMULARY MEDICATION Take 180 mg by mouth daily. Medication Name: bempedoic acid vs placebo. CLEAR Research study drug provided  . amLODipine (NORVASC) 5 MG tablet Take 1 tablet (5 mg total) by mouth daily.  Marland Kitchen aspirin EC 81 MG tablet Take 1 tablet (81 mg total) by mouth daily.  . cyclobenzaprine (FLEXERIL) 10 MG tablet TAKE 1 TABLET BY MOUTH AT BEDTIME.  Marland Kitchen gabapentin (NEURONTIN) 100 MG capsule Take 2 capsules (200 mg total) by mouth at bedtime.  Marland Kitchen lisinopril (PRINIVIL,ZESTRIL) 20 MG tablet Take 1 tablet (20 mg total) by mouth daily.  . meloxicam (MOBIC) 7.5 MG tablet Take 2 tablets (15 mg total) by mouth daily. Take 1-2 tablets daily by mouth for severe joint pains (Patient taking differently: Take 15 mg by mouth daily. Take 1-2 tablets daily by mouth for severe joint pains PRN)  . metoprolol succinate (TOPROL-XL) 25 MG 24 hr tablet Take 1 tablet (25 mg total) by mouth daily.  . nitroGLYCERIN (NITROSTAT) 0.4 MG SL tablet Place 1 tablet (0.4 mg total) under the tongue every 5 (five) minutes as needed for chest pain.  . pantoprazole (PROTONIX) 40 MG tablet Take 1 tablet (40 mg total) by mouth daily.  . traMADol (ULTRAM) 50 MG tablet Take 50 mg by  mouth every 6 (six) hours as needed for moderate pain.     PHQ 2/9 Scores 09/06/2017 05/01/2017 01/06/2017 07/13/2016  PHQ - 2 Score 0 0 0 0  PHQ- 9 Score 0 1 0 0    Physical Exam  Constitutional: He is oriented to person, place, and time. He appears well-developed. No distress.  HENT:  Head: Normocephalic and atraumatic.  Cardiovascular: Normal rate, regular rhythm and normal heart sounds.  Pulmonary/Chest: Effort normal and breath sounds normal. No respiratory distress.  Musculoskeletal: Normal range of motion.       Thoracic back: He exhibits no bony tenderness, no edema and no deformity.  Neurological: He is alert and oriented to person, place, and time.  Skin: Skin is warm and dry. No rash noted.  Psychiatric: He has a normal mood and affect. His behavior is normal. Thought content normal.  Nursing note and vitals reviewed.   BP 104/64 (BP Location: Left Arm, Patient Position: Sitting, Cuff Size: Normal)   Pulse 61   Ht 5\' 6"  (1.676 m)  Wt 177 lb (80.3 kg)   SpO2 95%   BMI 28.57 kg/m   Assessment and Plan: 1. Essential hypertension Controlled, continue current therapy  2. Coronary artery disease of native artery of native heart with stable angina pectoris [facility]) Recent cath showed stable disease Continue medical management  3. Degenerative disc disease, thoracic May take tramadol 1-2 time per week PRN   Partially dictated using Editor, commissioning. Any errors are unintentional.  Halina Maidens, MD [facility]  12/08/2017

## 2017-12-12 ENCOUNTER — Ambulatory Visit (INDEPENDENT_AMBULATORY_CARE_PROVIDER_SITE_OTHER): Payer: PPO

## 2017-12-12 ENCOUNTER — Encounter: Payer: PPO | Admitting: *Deleted

## 2017-12-12 VITALS — BP 142/75 | HR 64 | Wt 178.0 lb

## 2017-12-12 DIAGNOSIS — Z23 Encounter for immunization: Secondary | ICD-10-CM | POA: Diagnosis not present

## 2017-12-12 DIAGNOSIS — Z006 Encounter for examination for normal comparison and control in clinical research program: Secondary | ICD-10-CM

## 2017-12-12 NOTE — Research (Signed)
Patient to research clinic for visit T10-M24 in the Clear research study.  91% compliant with meds and new drug dispensed.  Next f/u phone call and clinic visit scheduled.

## 2017-12-19 ENCOUNTER — Ambulatory Visit (INDEPENDENT_AMBULATORY_CARE_PROVIDER_SITE_OTHER): Payer: PPO | Admitting: Vascular Surgery

## 2017-12-19 ENCOUNTER — Encounter (INDEPENDENT_AMBULATORY_CARE_PROVIDER_SITE_OTHER): Payer: Self-pay | Admitting: Vascular Surgery

## 2017-12-19 VITALS — BP 122/76 | HR 66 | Resp 17 | Ht 66.0 in | Wt 179.0 lb

## 2017-12-19 DIAGNOSIS — I10 Essential (primary) hypertension: Secondary | ICD-10-CM

## 2017-12-19 DIAGNOSIS — I6523 Occlusion and stenosis of bilateral carotid arteries: Secondary | ICD-10-CM

## 2017-12-19 DIAGNOSIS — E785 Hyperlipidemia, unspecified: Secondary | ICD-10-CM

## 2017-12-19 DIAGNOSIS — M79604 Pain in right leg: Secondary | ICD-10-CM

## 2017-12-19 DIAGNOSIS — M79605 Pain in left leg: Secondary | ICD-10-CM

## 2017-12-19 DIAGNOSIS — F17290 Nicotine dependence, other tobacco product, uncomplicated: Secondary | ICD-10-CM | POA: Diagnosis not present

## 2017-12-19 DIAGNOSIS — M79609 Pain in unspecified limb: Secondary | ICD-10-CM

## 2017-12-19 HISTORY — DX: Pain in unspecified limb: M79.609

## 2017-12-19 NOTE — Patient Instructions (Signed)
Peripheral Vascular Disease Peripheral vascular disease (PVD) is a disease of the blood vessels that are not part of your heart and brain. A simple term for PVD is poor circulation. In most cases, PVD narrows the blood vessels that carry blood from your heart to the rest of your body. This can result in a decreased supply of blood to your arms, legs, and internal organs, like your stomach or kidneys. However, it most often affects a person's lower legs and feet. There are two types of PVD.  Organic PVD. This is the more common type. It is caused by damage to the structure of blood vessels.  Functional PVD. This is caused by conditions that make blood vessels contract and tighten (spasm).  Without treatment, PVD tends to get worse over time. PVD can also lead to acute ischemic limb. This is when an arm or limb suddenly has trouble getting enough blood. This is a medical emergency. What are the causes? Each type of PVD has many different causes. The most common cause of PVD is buildup of a fatty material (plaque) inside of your arteries (atherosclerosis). Small amounts of plaque can break off from the walls of the blood vessels and become lodged in a smaller artery. This blocks blood flow and can cause acute ischemic limb. Other common causes of PVD include:  Blood clots that form inside of blood vessels.  Injuries to blood vessels.  Diseases that cause inflammation of blood vessels or cause blood vessel spasms.  Health behaviors and health history that increase your risk of developing PVD.  What increases the risk? You may have a greater risk of PVD if you:  Have a family history of PVD.  Have certain medical conditions, including: ? High cholesterol. ? Diabetes. ? High blood pressure (hypertension). ? Coronary heart disease. ? Past problems with blood clots. ? Past injury, such as burns or a broken bone. These may have damaged blood vessels in your limbs. ? Buerger disease. This is  caused by inflamed blood vessels in your hands and feet. ? Some forms of arthritis. ? Rare birth defects that affect the arteries in your legs.  Use tobacco.  Do not get enough exercise.  Are obese.  Are age 50 or older.  What are the signs or symptoms? PVD may cause many different symptoms. Your symptoms depend on what part of your body is not getting enough blood. Some common signs and symptoms include:  Cramps in your lower legs. This may be a symptom of poor leg circulation (claudication).  Pain and weakness in your legs while you are physically active that goes away when you rest (intermittent claudication).  Leg pain when at rest.  Leg numbness, tingling, or weakness.  Coldness in a leg or foot, especially when compared with the other leg.  Skin or hair changes. These can include: ? Hair loss. ? Shiny skin. ? Pale or bluish skin. ? Thick toenails.  Inability to get or maintain an erection (erectile dysfunction).  People with PVD are more prone to developing ulcers and sores on their toes, feet, or legs. These may take longer than normal to heal. How is this diagnosed? Your health care provider may diagnose PVD from your signs and symptoms. The health care provider will also do a physical exam. You may have tests to find out what is causing your PVD and determine its severity. Tests may include:  Blood pressure recordings from your arms and legs and measurements of the strength of your pulses (  pulse volume recordings).  Imaging studies using sound waves to take pictures of the blood flow through your blood vessels (Doppler ultrasound).  Injecting a dye into your blood vessels before having imaging studies using: ? X-rays (angiogram or arteriogram). ? Computer-generated X-rays (CT angiogram). ? A powerful electromagnetic field and a computer (magnetic resonance angiogram or MRA).  How is this treated? Treatment for PVD depends on the cause of your condition and the  severity of your symptoms. It also depends on your age. Underlying causes need to be treated and controlled. These include Hayman-lasting (chronic) conditions, such as diabetes, high cholesterol, and high blood pressure. You may need to first try making lifestyle changes and taking medicines. Surgery may be needed if these do not work. Lifestyle changes may include:  Quitting smoking.  Exercising regularly.  Following a low-fat, low-cholesterol diet.  Medicines may include:  Blood thinners to prevent blood clots.  Medicines to improve blood flow.  Medicines to improve your blood cholesterol levels.  Surgical procedures may include:  A procedure that uses an inflated balloon to open a blocked artery and improve blood flow (angioplasty).  A procedure to put in a tube (stent) to keep a blocked artery open (stent implant).  Surgery to reroute blood flow around a blocked artery (peripheral bypass surgery).  Surgery to remove dead tissue from an infected wound on the affected limb.  Amputation. This is surgical removal of the affected limb. This may be necessary in cases of acute ischemic limb that are not improved through medical or surgical treatments.  Follow these instructions at home:  Take medicines only as directed by your health care provider.  Do not use any tobacco products, including cigarettes, chewing tobacco, or electronic cigarettes. If you need help quitting, ask your health care provider.  Lose weight if you are overweight, and maintain a healthy weight as directed by your health care provider.  Eat a diet that is low in fat and cholesterol. If you need help, ask your health care provider.  Exercise regularly. Ask your health care provider to suggest some good activities for you.  Use compression stockings or other mechanical devices as directed by your health care provider.  Take good care of your feet. ? Wear comfortable shoes that fit well. ? Check your feet  often for any cuts or sores. Contact a health care provider if:  You have cramps in your legs while walking.  You have leg pain when you are at rest.  You have coldness in a leg or foot.  Your skin changes.  You have erectile dysfunction.  You have cuts or sores on your feet that are not healing. Get help right away if:  Your arm or leg turns cold and blue.  Your arms or legs become red, warm, swollen, painful, or numb.  You have chest pain or trouble breathing.  You suddenly have weakness in your face, arm, or leg.  You become very confused or lose the ability to speak.  You suddenly have a very bad headache or lose your vision. This information is not intended to replace advice given to you by your health care provider. Make sure you discuss any questions you have with your health care provider. Document Released: 03/24/2004 Document Revised: 07/23/2015 Document Reviewed: 07/25/2013 [employer] Interactive Patient Education  2017 [employer]  

## 2017-12-19 NOTE — Assessment & Plan Note (Signed)
blood pressure control important in reducing the progression of atherosclerotic disease. On appropriate oral medications.  

## 2017-12-19 NOTE — Assessment & Plan Note (Signed)
The patient describes symptoms consistent with claudication of both lower extremities.  Given his multiple atherosclerotic risk factors and previous atherosclerotic disease and multiple other vascular beds, PAD would be #1 on the differential diagnosis.  I have recommended ABIs to be done at his convenience in the near future.  I discussed the pathophysiology and natural history of peripheral arterial disease.

## 2017-12-19 NOTE — Assessment & Plan Note (Signed)
lipid control important in reducing the progression of atherosclerotic disease. Intolerant to statins

## 2017-12-19 NOTE — Assessment & Plan Note (Signed)
The patient was studied about 5 to 6 months ago and had a patent right carotid endarterectomy and 60 to 79% left ICA stenosis.  We will get that checked in the near future at his convenience.  Continue his current medical regimen

## 2017-12-19 NOTE — Progress Notes (Signed)
Patient ID: [name], male   DOB: [birth year], 67 y.o.   MRN: [id number]  Chief Complaint  Patient presents with  . New Patient (Initial Visit)    Carotid Stenosis and Claudication    HPI [name] is a 67 y.o. male.  Patient presents to our office for evaluation and ongoing treatment of his vascular issues.  He has recently moved to our area from [place], and needs to establish care with a vascular surgeon.  He is about 7 years status post right carotid endarterectomy for high-grade symptomatic stenosis with amaurosis fugax.  He has done well with that.  He was last checked about 5 to 6 months ago and found to have stable 60 to 79% left ICA stenosis and a patent right carotid endarterectomy.  No focal neurologic symptoms. Specifically, the patient denies amaurosis fugax, speech or swallowing difficulties, or arm or leg weakness or numbness Also, the patient complains of significant pain in his legs particularly with activity.  This affects the lower legs and feet.  Stopping and resting seems to improve the symptoms.  Activity worsens the symptoms.  This has been a gradually progressing problem over months to years.  No clear cause or inciting event.  I did not see that he had ever had ABIs checked at his previous vascular surgeon's office, but they had recommended it and discussed checking it in the past.  Past Medical History:  Diagnosis Date  . Abnormal nuclear cardiac imaging test 08/08/2015  . Arthritis   . Carotid artery occlusion   . Coronary atherosclerosis of native coronary artery 01/29/2013  . Duodenal erosion   . Encounter for screening for lung cancer 07/13/2016  . Esophageal stenosis   . GERD (gastroesophageal reflux disease)   . H. pylori infection   . Heart attack [facility]) Oct. 2009   Mild  . Hiatal hernia   . Hyperlipidemia   . Hypertension   . 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 Cardiac  catheterization-nonobstructive 60% RCA disease-subsequent nuclear stress test-9 minutes, low risk, mild inferior wall hypokinesis   . Shortness of breath dyspnea     Past Surgical History:  Procedure Laterality Date  . APPENDECTOMY    . CARDIAC CATHETERIZATION N/A 08/07/2015   Procedure: Left Heart Cath and Coronary Angiography;  Surgeon: [name], MD;  Location: [place] [facility];  Service: Cardiovascular;  Laterality: N/A;  . CARDIAC CATHETERIZATION N/A 08/07/2015   Procedure: Coronary Stent Intervention;  Surgeon: [name], MD;  Location: [place] [facility];  Service: Cardiovascular;  Laterality: N/A;  . CARDIAC CATHETERIZATION N/A 08/07/2015   Procedure: Coronary Stent Intervention;  Surgeon: [name], MD;  Location: [place] [facility];  Service: Cardiovascular;  Laterality: N/A;  . CAROTID ENDARTERECTOMY  01/05/2006   Right  CEA with DPA  . CATARACT EXTRACTION W/ INTRAOCULAR LENS IMPLANT Left 12/04/2017  . CATARACT EXTRACTION W/PHACO Left 12/04/2017   Procedure: CATARACT EXTRACTION PHACO AND INTRAOCULAR LENS PLACEMENT ([place]) LEFT;  Surgeon: Eulogio Bear, MD;  Location: [place];  Service: Ophthalmology;  Laterality: Left;  . CORONARY STENT PLACEMENT  08/07/2015   MID Crawford  . HIP SURGERY Left 10/2016   left hip tendon repair  . LEFT HEART CATH AND CORONARY ANGIOGRAPHY N/A 11/27/2017   Procedure: LEFT HEART CATH AND CORONARY ANGIOGRAPHY;  Surgeon: Wellington Hampshire, MD;  Location: [place] [facility];  Service: Cardiovascular;  Laterality: N/A;  .  SPINE SURGERY      Family History  Problem Relation Age of Onset  . Heart attack Mother   . Coronary artery disease Mother   . Heart disease Mother        Carotid Stenosis and BPG and Heart Disease before age 72  . Diabetes Mother   . Hypertension Mother   . Heart attack Father   . Heart disease Father        BPG and Heart Disease before age 32  . Hypertension Father   . Cancer Father 55        throat  . Stroke Father   . Colon cancer Neg Hx   . Colon polyps Neg Hx   . Esophageal cancer Neg Hx   . Rectal cancer Neg Hx   . Stomach cancer Neg Hx      Social History Social History   Tobacco Use  . Smoking status: Former Smoker    Packs/day: 1.25    Years: 35.00    Pack years: 43.75    Types: Cigarettes    Last attempt to quit: 02/28/2005    Years since quitting: 12.8  . Smokeless tobacco: Current User    Types: Snuff  Substance Use Topics  . Alcohol use: Yes    Alcohol/week: 8.0 - 10.0 standard drinks    Types: 8 - 10 Glasses of wine per week  . Drug use: No     Allergies  Allergen Reactions  . Brilinta [Ticagrelor]     Shortness of breath  . Zetia [Ezetimibe] Other (See Comments)    Muscle aches  . Statins Other (See Comments)    Failed Crestor 5 mg twice weekly, Crestor 20 mg daily, Pravastatin 40 mg qd, Lipitor, Zocor - muscle aches    Current Outpatient Medications  Medication Sig Dispense Refill  . AMBULATORY NON FORMULARY MEDICATION Take 180 mg by mouth daily. Medication Name: bempedoic acid vs placebo. CLEAR Research study drug provided    . amLODipine (NORVASC) 5 MG tablet Take 1 tablet (5 mg total) by mouth daily. 90 tablet 3  . aspirin EC 81 MG tablet Take 1 tablet (81 mg total) by mouth daily. 90 tablet 3  . cyclobenzaprine (FLEXERIL) 10 MG tablet Take 1 tablet (10 mg total) by mouth at bedtime. 30 tablet 0  . gabapentin (NEURONTIN) 100 MG capsule Take 2 capsules (200 mg total) by mouth at bedtime. 180 capsule 1  . lisinopril (PRINIVIL,ZESTRIL) 20 MG tablet Take 1 tablet (20 mg total) by mouth daily. 90 tablet 3  . meloxicam (MOBIC) 7.5 MG tablet Take 2 tablets (15 mg total) by mouth daily. Take 1-2 tablets daily by mouth for severe joint pains (Patient taking differently: Take 15 mg by mouth daily. Take 1-2 tablets daily by mouth for severe joint pains PRN) 180 tablet 0  . metoprolol succinate (TOPROL-XL) 25 MG 24 hr tablet Take 1 tablet (25 mg  total) by mouth daily. 90 tablet 3  . nitroGLYCERIN (NITROSTAT) 0.4 MG SL tablet Place 1 tablet (0.4 mg total) under the tongue every 5 (five) minutes as needed for chest pain. 25 tablet 2  . pantoprazole (PROTONIX) 40 MG tablet Take 1 tablet (40 mg total) by mouth daily. 90 tablet 3  . traMADol (ULTRAM) 50 MG tablet Take 1 tablet (50 mg total) by mouth every 6 (six) hours as needed for moderate pain. 30 tablet 1   No current facility-administered medications for this visit.       REVIEW OF SYSTEMS (  Negative unless checked)  Constitutional: '[]'$ Weight loss  '[]'$ Fever  '[]'$ Chills Cardiac: '[]'$ Chest pain   '[]'$ Chest pressure   '[]'$ Palpitations   '[]'$ Shortness of breath when laying flat   '[]'$ Shortness of breath at rest   '[]'$ Shortness of breath with exertion. Vascular:  '[x]'$ Pain in legs with walking   '[]'$ Pain in legs at rest   '[]'$ Pain in legs when laying flat   '[x]'$ Claudication   '[x]'$ Pain in feet when walking  '[]'$ Pain in feet at rest  '[]'$ Pain in feet when laying flat   '[]'$ History of DVT   '[]'$ Phlebitis   '[]'$ Swelling in legs   '[]'$ Varicose veins   '[]'$ Non-healing ulcers Pulmonary:   '[]'$ Uses home oxygen   '[]'$ Productive cough   '[]'$ Hemoptysis   '[]'$ Wheeze  '[]'$ COPD   '[]'$ Asthma Neurologic:  '[]'$ Dizziness  '[]'$ Blackouts   '[]'$ Seizures   '[]'$ History of stroke   '[x]'$ History of TIA  '[]'$ Aphasia   '[x]'$ Temporary blindness   '[]'$ Dysphagia   '[]'$ Weakness or numbness in arms   '[]'$ Weakness or numbness in legs Musculoskeletal:  '[x]'$ Arthritis   '[]'$ Joint swelling   '[]'$ Joint pain   '[]'$ Low back pain Hematologic:  '[]'$ Easy bruising  '[]'$ Easy bleeding   '[]'$ Hypercoagulable state   '[]'$ Anemic  '[]'$ Hepatitis Gastrointestinal:  '[]'$ Blood in stool   '[]'$ Vomiting blood  '[]'$ Gastroesophageal reflux/heartburn   '[]'$ Abdominal pain Genitourinary:  '[]'$ Chronic kidney disease   '[]'$ Difficult urination  '[]'$ Frequent urination  '[]'$ Burning with urination   '[]'$ Hematuria Skin:  '[]'$ Rashes   '[]'$ Ulcers   '[]'$ Wounds Psychological:  '[]'$ History of anxiety   '[]'$  History of major depression.    Physical Exam BP 122/76 (BP  Location: Right Arm, Patient Position: Sitting)   Pulse 66   Resp 17   Ht '5\' 6"'$  (1.676 m)   Wt 179 lb (81.2 kg)   BMI 28.89 kg/m  Gen:  WD/WN, NAD. Appears younger than stated age. Head: [place]/AT, No temporalis wasting Ear/Nose/Throat: Hearing grossly intact, nares w/o erythema or drainage, oropharynx w/o Erythema/Exudate Eyes: Conjunctiva clear, sclera non-icteric  Neck: trachea midline. Soft left carotid bruit Pulmonary:  Good air movement, clear to auscultation bilaterally.  Cardiac: RRR, no JVD Vascular:  Vessel Right Left  Radial Palpable Palpable                  Femoral Palpable Palpable  Popliteal  1+ palpable 1+ Palpable  PT Palpable Palpable  DP  Not palpable Not Palpable   Gastrointestinal: soft, non-tender/non-distended.  Musculoskeletal: M/S 5/5 throughout.  Extremities without ischemic changes.  No deformity or atrophy. No edema. Neurologic: Sensation grossly intact in extremities.  Symmetrical.  Speech is fluent. Motor exam as listed above. Psychiatric: Judgment intact, Mood & affect appropriate for pt's clinical situation. Dermatologic: No rashes or ulcers noted.  No cellulitis or open wounds.  Radiology Dg Chest 2 View  Result Date: 11/24/2017 CLINICAL DATA:  67 year old male with chest pain. EXAM: CHEST - 2 VIEW COMPARISON:  Chest radiograph dated 09/19/2017 FINDINGS: There are bibasilar atelectatic changes/scarring. No focal consolidation, pleural effusion, or pneumothorax. The cardiac silhouette is within normal limits. No acute osseous pathology. IMPRESSION: No active cardiopulmonary disease. Electronically Signed   By: Anner Crete M.D.   On: 11/24/2017 21:27   Nm Myocar Multi W/spect W/wall Motion / Ef  Result Date: 11/25/2017  Abnormal, potentially high risk pharmacologic myocardial perfusion stress test.  There is a large in size, mild to moderately severe, minimally reversible defect involving the septum and anterior walls, as well as the apex.  While this may represent artifact from LBBB, anterior and apical involvement raises the possibility  of scar with periinfarct ischemia.  The left ventricular ejection fraction is severely decreased (29%) with global hypokinesis and a dyskinetic septum.     Labs Recent Results (from the past 2160 hour(s))  Basic metabolic panel     Status: Abnormal   Collection Time: 11/24/17  9:05 PM  Result Value Ref Range   Sodium 140 135 - 145 mmol/L   Potassium 3.8 3.5 - 5.1 mmol/L   Chloride 102 98 - 111 mmol/L   CO2 22 22 - 32 mmol/L   Glucose, Bld 104 (H) 70 - 99 mg/dL   BUN 24 (H) 8 - 23 mg/dL   Creatinine, Ser 0.95 0.61 - 1.24 mg/dL   Calcium 9.1 8.9 - 10.3 mg/dL   GFR calc non Af Amer >60 >60 mL/min   GFR calc Af Amer >60 >60 mL/min    Comment: (NOTE) The eGFR has been calculated using the CKD EPI equation. This calculation has not been validated in all clinical situations. eGFR's persistently <60 mL/min signify possible Chronic Kidney Disease.    Anion gap 16 (H) 5 - 15    Comment: Performed at [facility], [address] [place], [place] [address]  CBC     Status: Abnormal   Collection Time: 11/24/17  9:05 PM  Result Value Ref Range   WBC 5.5 3.8 - 10.6 K/uL   RBC 4.35 (L) 4.40 - 5.90 MIL/uL   Hemoglobin 14.0 13.0 - 18.0 g/dL   HCT 40.0 40.0 - 52.0 %   MCV 92.1 80.0 - 100.0 fL   MCH 32.2 26.0 - 34.0 pg   MCHC 35.0 32.0 - 36.0 g/dL   RDW 13.2 11.5 - 14.5 %   Platelets 239 150 - 440 K/uL    Comment: Performed at [facility], [address] [place], [place] [address]  Troponin I     Status: None   Collection Time: 11/24/17  9:05 PM  Result Value Ref Range   Troponin I <0.03 <0.03 ng/mL    Comment: Performed at [facility], [address] [place], [place] [address]  [place] (order if Patient is taking Coumadin / Warfarin)     Status: None   Collection Time: 11/24/17  9:05 PM  Result Value Ref Range   Prothrombin Time 13.1 11.4 - 15.2  seconds   INR 1.00     Comment: Performed at [facility], [address] [place], [place] [address]  APTT     Status: None   Collection Time: 11/24/17  9:05 PM  Result Value Ref Range   aPTT 30 24 - 36 seconds    Comment: Performed at [facility], [address] [place], [place] [address]  CK     Status: None   Collection Time: 11/24/17  9:05 PM  Result Value Ref Range   Total CK 232 49 - 397 U/L    Comment: Performed at [facility], [address] [place], [place] [address]  Troponin I     Status: None   Collection Time: 11/25/17 12:22 AM  Result Value Ref Range   Troponin I <0.03 <0.03 ng/mL    Comment: Performed at [facility], [address] [place], [place] [address]  HIV antibody (Routine Testing)     Status: None   Collection Time: 11/25/17  6:02 AM  Result Value Ref Range   HIV Screen 4th Generation wRfx Non Reactive Non Reactive    Comment: (NOTE) Performed At: [address]  [place], [place] [id number] Rush Farmer MD [id number]   Basic metabolic panel     Status: Abnormal   Collection Time: 11/25/17  6:02 AM  Result Value Ref Range   Sodium 143 135 - 145 mmol/L   Potassium 4.1 3.5 - 5.1 mmol/L   Chloride 106 98 - 111 mmol/L   CO2 25 22 - 32 mmol/L   Glucose, Bld 99 70 - 99 mg/dL   BUN 23 8 - 23 mg/dL   Creatinine, Ser 0.77 0.61 - 1.24 mg/dL   Calcium 8.7 (L) 8.9 - 10.3 mg/dL   GFR calc non Af Amer >60 >60 mL/min   GFR calc Af Amer >60 >60 mL/min    Comment: (NOTE) The eGFR has been calculated using the CKD EPI equation. This calculation has not been validated in all clinical situations. eGFR's persistently <60 mL/min signify possible Chronic Kidney Disease.    Anion gap 12 5 - 15    Comment: Performed at [facility], [address] [place], [place] [address]  CBC     Status: Abnormal   Collection Time: 11/25/17  6:02 AM  Result Value Ref Range   WBC 6.1 3.8 - 10.6 K/uL     RBC 4.39 (L) 4.40 - 5.90 MIL/uL   Hemoglobin 13.9 13.0 - 18.0 g/dL   HCT 40.5 40.0 - 52.0 %   MCV 92.2 80.0 - 100.0 fL   MCH 31.6 26.0 - 34.0 pg   MCHC 34.3 32.0 - 36.0 g/dL   RDW 13.4 11.5 - 14.5 %   Platelets 235 150 - 440 K/uL    Comment: Performed at [facility], [address] [place], [place] [address]  Lipid panel     Status: Abnormal   Collection Time: 11/25/17  6:02 AM  Result Value Ref Range   Cholesterol 179 0 - 200 mg/dL   Triglycerides 150 (H) <150 mg/dL   HDL 43 >40 mg/dL   Total CHOL/HDL Ratio 4.2 RATIO   VLDL 30 0 - 40 mg/dL   LDL Cholesterol 106 (H) 0 - 99 mg/dL    Comment:        Total Cholesterol/HDL:CHD Risk Coronary Heart Disease Risk Table                     Men   Women  1/2 Average Risk   3.4   3.3  Average Risk       5.0   4.4  2 X Average Risk   9.6   7.1  3 X Average Risk  23.4   11.0        Use the calculated Patient Ratio above and the CHD Risk Table to determine the patient's CHD Risk.        ATP III CLASSIFICATION (LDL):  <100     mg/dL   Optimal  100-129  mg/dL   Near or Above                    Optimal  130-159  mg/dL   Borderline  160-189  mg/dL   High  >190     mg/dL   Very High Performed at [facility], [address] [place], [place] [address]   Troponin I     Status: None   Collection Time: 11/25/17  6:02 AM  Result Value Ref Range   Troponin I <0.03 <0.03 ng/mL    Comment: Performed at [facility], [place],  [place] [address]  Glucose, capillary     Status: None   Collection Time: 11/25/17  7:23 AM  Result Value Ref Range   Glucose-Capillary 91 70 - 99 mg/dL   Comment 1 Notify RN   ECHOCARDIOGRAM COMPLETE     Status: None   Collection Time: 11/25/17 10:05 AM  Result Value Ref Range   Weight 2,777.6 oz   Height 66 in   BP 129/85 mmHg  NM Myocar Multi W/Spect W/Wall Motion / EF     Status: None   Collection Time: 11/25/17 12:09 PM  Result Value Ref Range   TID 1.02     LV sys vol 120 mL   LV dias vol 184 62 - 150 mL   Rest HR 71 bpm   Rest BP 148/82 mmHg   Peak HR 100 bpm   Peak BP 167/87 mmHg  Glucose, capillary     Status: Abnormal   Collection Time: 11/26/17  7:56 AM  Result Value Ref Range   Glucose-Capillary 118 (H) 70 - 99 mg/dL  Glucose, capillary     Status: Abnormal   Collection Time: 11/27/17  7:36 AM  Result Value Ref Range   Glucose-Capillary 103 (H) 70 - 99 mg/dL    Assessment/Plan:  Hyperlipidemia LDL goal <70 lipid control important in reducing the progression of atherosclerotic disease. Intolerant to statins   Essential hypertension blood pressure control important in reducing the progression of atherosclerotic disease. On appropriate oral medications.   Carotid stenosis-s/p RCE The patient was studied about 5 to 6 months ago and had a patent right carotid endarterectomy and 60 to 79% left ICA stenosis.  We will get that checked in the near future at his convenience.  Continue his current medical regimen  Pain in limb The patient describes symptoms consistent with claudication of both lower extremities.  Given his multiple atherosclerotic risk factors and previous atherosclerotic disease and multiple other vascular beds, PAD would be #1 on the differential diagnosis.  I have recommended ABIs to be done at his convenience in the near future.  I discussed the pathophysiology and natural history of peripheral arterial disease.      [name] 12/19/2017, 12:00 PM   This note was created with Dragon medical transcription system.  Any errors from dictation are unintentional.

## 2018-01-02 ENCOUNTER — Encounter (HOSPITAL_COMMUNITY): Payer: PPO

## 2018-01-02 ENCOUNTER — Ambulatory Visit: Payer: PPO | Admitting: Family

## 2018-01-04 ENCOUNTER — Other Ambulatory Visit: Payer: PPO

## 2018-01-04 ENCOUNTER — Other Ambulatory Visit
Admission: RE | Admit: 2018-01-04 | Discharge: 2018-01-04 | Disposition: A | Discharge status: Home / Self Care | Payer: PPO | Source: Ambulatory Visit | Attending: Internal Medicine | Admitting: Internal Medicine

## 2018-01-04 DIAGNOSIS — Z79899 Other long term (current) drug therapy: Secondary | ICD-10-CM | POA: Insufficient documentation

## 2018-01-04 LAB — BASIC METABOLIC PANEL
Anion gap: 11 (ref 5–15)
BUN: 29 mg/dL — AB (ref 8–23)
CALCIUM: 9.3 mg/dL (ref 8.9–10.3)
CO2: 26 mmol/L (ref 22–32)
Chloride: 104 mmol/L (ref 98–111)
Creatinine, Ser: 0.95 mg/dL (ref 0.61–1.24)
GFR calc Af Amer: >60 mL/min (ref >60)
Glucose, Bld: 106 mg/dL — ABNORMAL HIGH (ref 70–99)
Potassium: 4.4 mmol/L (ref 3.5–5.1)
Sodium: 141 mmol/L (ref 135–145)

## 2018-01-16 ENCOUNTER — Ambulatory Visit (INDEPENDENT_AMBULATORY_CARE_PROVIDER_SITE_OTHER): Payer: PPO

## 2018-01-16 ENCOUNTER — Encounter (INDEPENDENT_AMBULATORY_CARE_PROVIDER_SITE_OTHER): Payer: Self-pay | Admitting: Vascular Surgery

## 2018-01-16 ENCOUNTER — Encounter (INDEPENDENT_AMBULATORY_CARE_PROVIDER_SITE_OTHER): Payer: Self-pay

## 2018-01-16 ENCOUNTER — Ambulatory Visit (INDEPENDENT_AMBULATORY_CARE_PROVIDER_SITE_OTHER): Payer: PPO | Admitting: Vascular Surgery

## 2018-01-16 VITALS — BP 149/87 | HR 63 | Resp 16 | Ht 66.0 in | Wt 178.0 lb

## 2018-01-16 DIAGNOSIS — I6523 Occlusion and stenosis of bilateral carotid arteries: Secondary | ICD-10-CM | POA: Diagnosis not present

## 2018-01-16 DIAGNOSIS — E785 Hyperlipidemia, unspecified: Secondary | ICD-10-CM | POA: Diagnosis not present

## 2018-01-16 DIAGNOSIS — I739 Peripheral vascular disease, unspecified: Secondary | ICD-10-CM

## 2018-01-16 DIAGNOSIS — M79605 Pain in left leg: Secondary | ICD-10-CM

## 2018-01-16 DIAGNOSIS — M79604 Pain in right leg: Secondary | ICD-10-CM | POA: Diagnosis not present

## 2018-01-16 DIAGNOSIS — F17290 Nicotine dependence, other tobacco product, uncomplicated: Secondary | ICD-10-CM | POA: Diagnosis not present

## 2018-01-16 DIAGNOSIS — I10 Essential (primary) hypertension: Secondary | ICD-10-CM | POA: Diagnosis not present

## 2018-01-16 NOTE — Assessment & Plan Note (Signed)
Carotid stenosis in the 40-59% range in the left carotid artery and a patent right CEA.  Below the threshold for repair at this point.  Recheck in 6 months with duplex.

## 2018-01-16 NOTE — Progress Notes (Signed)
MRN : [id number]  [name] is a 67 y.o. (11-Mar-1950) male who presents with chief complaint of  Chief Complaint  Patient presents with  . Follow-up    ultrasound follow up  .  History of Present Illness: Patient returns today in follow up of multiple vascular issues.  He continues to have short distance claudication in the legs, particularly the left leg.  No ulceration or infection.  His non-invasive studies today show a right ABI of 0.97 and a left ABI of 0.66 with poor waveforms.  His left digital pressures were marked reduced as well and duplex showed SFA/popliteal disease as well as TP trunk occlusion and tibial disease.   He is also evaluated for carotid disease today.  No new focal neurologic symptoms. Specifically, the patient denies amaurosis fugax, speech or swallowing difficulties, or arm or leg weakness or numbness. Carotid stenosis in the 40-59% range in the left carotid artery and a patent right CEA.  Current Outpatient Medications  Medication Sig Dispense Refill  . AMBULATORY NON FORMULARY MEDICATION Take 180 mg by mouth daily. Medication Name: bempedoic acid vs placebo. CLEAR Research study drug provided    . amLODipine (NORVASC) 5 MG tablet Take 1 tablet (5 mg total) by mouth daily. 90 tablet 3  . aspirin EC 81 MG tablet Take 1 tablet (81 mg total) by mouth daily. 90 tablet 3  . cyclobenzaprine (FLEXERIL) 10 MG tablet Take 1 tablet (10 mg total) by mouth at bedtime. 30 tablet 0  . gabapentin (NEURONTIN) 100 MG capsule Take 2 capsules (200 mg total) by mouth at bedtime. 180 capsule 1  . lisinopril (PRINIVIL,ZESTRIL) 20 MG tablet Take 1 tablet (20 mg total) by mouth daily. 90 tablet 3  . meloxicam (MOBIC) 7.5 MG tablet Take 2 tablets (15 mg total) by mouth daily. Take 1-2 tablets daily by mouth for severe joint pains (Patient taking differently: Take 15 mg by mouth daily. Take 1-2 tablets daily by mouth for severe joint pains PRN) 180 tablet 0  . metoprolol succinate  (TOPROL-XL) 25 MG 24 hr tablet Take 1 tablet (25 mg total) by mouth daily. 90 tablet 3  . nitroGLYCERIN (NITROSTAT) 0.4 MG SL tablet Place 1 tablet (0.4 mg total) under the tongue every 5 (five) minutes as needed for chest pain. 25 tablet 2  . pantoprazole (PROTONIX) 40 MG tablet Take 1 tablet (40 mg total) by mouth daily. 90 tablet 3  . traMADol (ULTRAM) 50 MG tablet Take 1 tablet (50 mg total) by mouth every 6 (six) hours as needed for moderate pain. 30 tablet 1   No current facility-administered medications for this visit.     Past Medical History:  Diagnosis Date  . Abnormal nuclear cardiac imaging test 08/08/2015  . Arthritis   . Carotid artery occlusion   . Coronary atherosclerosis of native coronary artery 01/29/2013  . Duodenal erosion   . Encounter for screening for lung cancer 07/13/2016  . Esophageal stenosis   . GERD (gastroesophageal reflux disease)   . H. pylori infection   . Heart attack [facility]) Oct. 2009   Mild  . Hiatal hernia   . Hyperlipidemia   . Hypertension   . 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 Cardiac catheterization-nonobstructive 60% RCA disease-subsequent nuclear stress test-9 minutes, low risk, mild inferior wall hypokinesis   . Shortness of breath dyspnea     Past Surgical History:  Procedure Laterality Date  . APPENDECTOMY    .  CARDIAC CATHETERIZATION N/A 08/07/2015   Procedure: Left Heart Cath and Coronary Angiography;  Surgeon: [name], MD;  Location: [place] [facility];  Service: Cardiovascular;  Laterality: N/A;  . CARDIAC CATHETERIZATION N/A 08/07/2015   Procedure: Coronary Stent Intervention;  Surgeon: [name], MD;  Location: [place] [facility];  Service: Cardiovascular;  Laterality: N/A;  . CARDIAC CATHETERIZATION N/A 08/07/2015   Procedure: Coronary Stent Intervention;  Surgeon: [name], MD;  Location: [place] [facility];  Service: Cardiovascular;  Laterality: N/A;  .  CAROTID ENDARTERECTOMY  01/05/2006   Right  CEA with DPA  . CATARACT EXTRACTION W/ INTRAOCULAR LENS IMPLANT Left 12/04/2017  . CATARACT EXTRACTION W/PHACO Left 12/04/2017   Procedure: CATARACT EXTRACTION PHACO AND INTRAOCULAR LENS PLACEMENT ([place]) LEFT;  Surgeon: Eulogio Bear, MD;  Location: [place];  Service: Ophthalmology;  Laterality: Left;  . CORONARY STENT PLACEMENT  08/07/2015   MID [place]  . HIP SURGERY Left 10/2016   left hip tendon repair  . LEFT HEART CATH AND CORONARY ANGIOGRAPHY N/A 11/27/2017   Procedure: LEFT HEART CATH AND CORONARY ANGIOGRAPHY;  Surgeon: Wellington Hampshire, MD;  Location: [place] [facility];  Service: Cardiovascular;  Laterality: N/A;  . SPINE SURGERY      Family History  Problem Relation Age of Onset  . Heart attack Mother   . Coronary artery disease Mother   . Heart disease Mother        Carotid Stenosis and BPG and Heart Disease before age 22  . Diabetes Mother   . Hypertension Mother   . Heart attack Father   . Heart disease Father        BPG and Heart Disease before age 59  . Hypertension Father   . Cancer Father 55       throat  . Stroke Father   . Colon cancer Neg Hx   . Colon polyps Neg Hx   . Esophageal cancer Neg Hx   . Rectal cancer Neg Hx   . Stomach cancer Neg Hx      Social History Social History        Tobacco Use  . Smoking status: Former Smoker    Packs/day: 1.25    Years: 35.00    Pack years: 43.75    Types: Cigarettes    Last attempt to quit: 02/28/2005    Years since quitting: 12.8  . Smokeless tobacco: Current User    Types: Snuff  Substance Use Topics  . Alcohol use: Yes    Alcohol/week: 8.0 - 10.0 standard drinks    Types: 8 - 10 Glasses of wine per week  . Drug use: No          Allergies  Allergen Reactions  . Brilinta [Ticagrelor]     Shortness of breath  . Zetia [Ezetimibe] Other (See Comments)    Muscle aches  . Statins Other  (See Comments)    Failed Crestor 5 mg twice weekly, Crestor 20 mg daily, Pravastatin 40 mg qd, Lipitor, Zocor - muscle aches      REVIEW OF SYSTEMS (Negative unless checked)  Constitutional: []Weight loss  []Fever  []Chills Cardiac: []Chest pain   []Chest pressure   []Palpitations   []Shortness of breath when laying flat   []Shortness of breath at rest   []Shortness of breath with exertion. Vascular:  [x]Pain in legs with walking   []Pain in legs at rest   []Pain in legs when laying flat   [  x]Claudication   [x]Pain in feet when walking  []Pain in feet at rest  []Pain in feet when laying flat   []History of DVT   []Phlebitis   []Swelling in legs   []Varicose veins   []Non-healing ulcers Pulmonary:   []Uses home oxygen   []Productive cough   []Hemoptysis   []Wheeze  []COPD   []Asthma Neurologic:  []Dizziness  []Blackouts   []Seizures   []History of stroke   [x]History of TIA  []Aphasia   [x]Temporary blindness   []Dysphagia   []Weakness or numbness in arms   []Weakness or numbness in legs Musculoskeletal:  [x]Arthritis   []Joint swelling   []Joint pain   []Low back pain Hematologic:  []Easy bruising  []Easy bleeding   []Hypercoagulable state   []Anemic  []Hepatitis Gastrointestinal:  []Blood in stool   []Vomiting blood  []Gastroesophageal reflux/heartburn   []Abdominal pain Genitourinary:  []Chronic kidney disease   []Difficult urination  []Frequent urination  []Burning with urination   []Hematuria Skin:  []Rashes   []Ulcers   []Wounds Psychological:  []History of anxiety   [] History of major depression.    Physical Examination  BP (!) 149/87 (BP Location: Right Arm)   Pulse 63   Resp 16   Ht 5' 6" (1.676 m)   Wt 178 lb (80.7 kg)   BMI 28.73 kg/m  Gen:  WD/WN, NAD Head: [place]/AT, No temporalis wasting. Ear/Nose/Throat: Hearing grossly intact, nares w/o erythema or drainage Eyes: Conjunctiva clear. Sclera non-icteric Neck: Supple.  Trachea midline Pulmonary:  Good air movement,  no use of accessory muscles.  Cardiac: RRR, no JVD Vascular:  Vessel Right Left  Radial Palpable Palpable                          PT 1+ Palpable Not Palpable  DP Palpable 1+ Palpable   Gastrointestinal: soft, non-tender/non-distended.  Musculoskeletal: M/S 5/5 throughout.  No deformity or atrophy. No edema. Neurologic: Sensation grossly intact in extremities.  Symmetrical.  Speech is fluent.  Psychiatric: Judgment intact, Mood & affect appropriate for pt's clinical situation. Dermatologic: No rashes or ulcers noted.  No cellulitis or open wounds.       Labs Recent Results (from the past 2160 hour(s))  Basic metabolic panel     Status: Abnormal   Collection Time: 11/24/17  9:05 PM  Result Value Ref Range   Sodium 140 135 - 145 mmol/L   Potassium 3.8 3.5 - 5.1 mmol/L   Chloride 102 98 - 111 mmol/L   CO2 22 22 - 32 mmol/L   Glucose, Bld 104 (H) 70 - 99 mg/dL   BUN 24 (H) 8 - 23 mg/dL   Creatinine, Ser 0.95 0.61 - 1.24 mg/dL   Calcium 9.1 8.9 - 10.3 mg/dL   GFR calc non Af Amer >60 >60 mL/min   GFR calc Af Amer >60 >60 mL/min    Comment: (NOTE) The eGFR has been calculated using the CKD EPI equation. This calculation has not been validated in all clinical situations. eGFR's persistently <60 mL/min signify possible Chronic Kidney Disease.    Anion gap 16 (H) 5 - 15    Comment: Performed at [facility], [address] [place], [place] [address]  CBC     Status: Abnormal   Collection Time: 11/24/17  9:05 PM  Result Value Ref Range   WBC 5.5 3.8 - 10.6 K/uL   RBC 4.35 (L) 4.40 - 5.90 MIL/uL   Hemoglobin 14.0   13.0 - 18.0 g/dL   HCT 40.0 40.0 - 52.0 %   MCV 92.1 80.0 - 100.0 fL   MCH 32.2 26.0 - 34.0 pg   MCHC 35.0 32.0 - 36.0 g/dL   RDW 13.2 11.5 - 14.5 %   Platelets 239 150 - 440 K/uL    Comment: Performed at [facility], [address] [place], [place] [address]  Troponin I     Status: None   Collection Time: 11/24/17  9:05 PM  Result  Value Ref Range   Troponin I <0.03 <0.03 ng/mL    Comment: Performed at [facility], [address] [place], [place] [address]  Protime-INR (order if Patient is taking Coumadin / Warfarin)     Status: None   Collection Time: 11/24/17  9:05 PM  Result Value Ref Range   Prothrombin Time 13.1 11.4 - 15.2 seconds   INR 1.00     Comment: Performed at [facility], [address] [place], [place] [address]  APTT     Status: None   Collection Time: 11/24/17  9:05 PM  Result Value Ref Range   aPTT 30 24 - 36 seconds    Comment: Performed at [facility], [address] [place], [place] [address]  CK     Status: None   Collection Time: 11/24/17  9:05 PM  Result Value Ref Range   Total CK 232 49 - 397 U/L    Comment: Performed at [facility], [address] [place], [place] [address]  Troponin I     Status: None   Collection Time: 11/25/17 12:22 AM  Result Value Ref Range   Troponin I <0.03 <0.03 ng/mL    Comment: Performed at [facility], [address] [place], [place] [address]  HIV antibody (Routine Testing)     Status: None   Collection Time: 11/25/17  6:02 AM  Result Value Ref Range   HIV Screen 4th Generation wRfx Non Reactive Non Reactive    Comment: (NOTE) Performed At: [facility] [address] [place], [place] [id number] Nagendra Sanjai MD Ph:[phone]   Basic metabolic panel     Status: Abnormal   Collection Time: 11/25/17  6:02 AM  Result Value Ref Range   Sodium 143 135 - 145 mmol/L   Potassium 4.1 3.5 - 5.1 mmol/L   Chloride 106 98 - 111 mmol/L   CO2 25 22 - 32 mmol/L   Glucose, Bld 99 70 - 99 mg/dL   BUN 23 8 - 23 mg/dL   Creatinine, Ser 0.77 0.61 - 1.24 mg/dL   Calcium 8.7 (L) 8.9 - 10.3 mg/dL   GFR calc non Af Amer >60 >60 mL/min   GFR calc Af Amer >60 >60 mL/min    Comment: (NOTE) The eGFR has been calculated using the CKD EPI equation. This calculation has not been validated in all clinical  situations. eGFR's persistently <60 mL/min signify possible Chronic Kidney Disease.    Anion gap 12 5 - 15    Comment: Performed at  Hospital Lab, [address] [place], [place] [address]  CBC     Status: Abnormal   Collection Time: 11/25/17  6:02 AM  Result Value Ref Range   WBC 6.1 3.8 - 10.6 K/uL   RBC 4.39 (L) 4.40 - 5.90 MIL/uL   Hemoglobin 13.9 13.0 - 18.0 g/dL   HCT 40.5 40.0 - 52.0 %   MCV 92.2 80.0 - 100.0 fL   MCH 31.6 26.0 -   34.0 pg   MCHC 34.3 32.0 - 36.0 g/dL   RDW 13.4 11.5 - 14.5 %   Platelets 235 150 - 440 K/uL    Comment: Performed at [facility], [address] [place], [place] [address]  Lipid panel     Status: Abnormal   Collection Time: 11/25/17  6:02 AM  Result Value Ref Range   Cholesterol 179 0 - 200 mg/dL   Triglycerides 150 (H) <150 mg/dL   HDL 43 >40 mg/dL   Total CHOL/HDL Ratio 4.2 RATIO   VLDL 30 0 - 40 mg/dL   LDL Cholesterol 106 (H) 0 - 99 mg/dL    Comment:        Total Cholesterol/HDL:CHD Risk Coronary Heart Disease Risk Table                     Men   Women  1/2 Average Risk   3.4   3.3  Average Risk       5.0   4.4  2 X Average Risk   9.6   7.1  3 X Average Risk  23.4   11.0        Use the calculated Patient Ratio above and the CHD Risk Table to determine the patient's CHD Risk.        ATP III CLASSIFICATION (LDL):  <100     mg/dL   Optimal  100-129  mg/dL   Near or Above                    Optimal  130-159  mg/dL   Borderline  160-189  mg/dL   High  >190     mg/dL   Very High Performed at [facility], [address] [place], [place] [address]   Troponin I     Status: None   Collection Time: 11/25/17  6:02 AM  Result Value Ref Range   Troponin I <0.03 <0.03 ng/mL    Comment: Performed at [facility], [address] [place], [place] [address]  Glucose, capillary     Status: None   Collection Time: 11/25/17  7:23 AM  Result Value Ref Range   Glucose-Capillary 91 70 - 99 mg/dL    Comment 1 Notify RN   ECHOCARDIOGRAM COMPLETE     Status: None   Collection Time: 11/25/17 10:05 AM  Result Value Ref Range   Weight 2,777.6 oz   Height 66 in   BP 129/85 mmHg  NM Myocar Multi W/Spect W/Wall Motion / EF     Status: None   Collection Time: 11/25/17 12:09 PM  Result Value Ref Range   TID 1.02    LV sys vol 120 mL   LV dias vol 184 62 - 150 mL   Rest HR 71 bpm   Rest BP 148/82 mmHg   Peak HR 100 bpm   Peak BP 167/87 mmHg  Glucose, capillary     Status: Abnormal   Collection Time: 11/26/17  7:56 AM  Result Value Ref Range   Glucose-Capillary 118 (H) 70 - 99 mg/dL  Glucose, capillary     Status: Abnormal   Collection Time: 11/27/17  7:36 AM  Result Value Ref Range   Glucose-Capillary 103 (H) 70 - 99 mg/dL  Basic metabolic panel     Status: Abnormal   Collection Time: 01/04/18  9:26 AM  Result Value Ref Range   Sodium 141 135 - 145 mmol/L   Potassium 4.4 3.5 -   5.1 mmol/L   Chloride 104 98 - 111 mmol/L   CO2 26 22 - 32 mmol/L   Glucose, Bld 106 (H) 70 - 99 mg/dL   BUN 29 (H) 8 - 23 mg/dL   Creatinine, Ser 0.95 0.61 - 1.24 mg/dL   Calcium 9.3 8.9 - 10.3 mg/dL   GFR calc non Af Amer >60 >60 mL/min   GFR calc Af Amer >60 >60 mL/min    Comment: (NOTE) The eGFR has been calculated using the CKD EPI equation. This calculation has not been validated in all clinical situations. eGFR's persistently <60 mL/min signify possible Chronic Kidney Disease.    Anion gap 11 5 - 15    Comment: Performed at [facility], [address] [place], [place] [address]    Radiology No results found.  Assessment/Plan Hyperlipidemia LDL goal <70 lipid control important in reducing the progression of atherosclerotic disease. Intolerant to statins   Essential hypertension blood pressure control important in reducing the progression of atherosclerotic disease. On appropriate oral medications.  Carotid stenosis-s/p RCE Carotid stenosis in the 40-59% range in the left  carotid artery and a patent right CEA.  Below the threshold for repair at this point.  Recheck in 6 months with duplex.  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  His left digital pressures were marked reduced as well and duplex showed SFA/popliteal disease as well as TP trunk occlusion and tibial disease.    Recommend:  The patient has experienced increased symptoms and is now describing lifestyle limiting claudication and mild rest pain.   Given the severity of the patient's left lower extremity symptoms the patient should undergo angiography and intervention.  Risk and benefits were reviewed the patient.  Indications for the procedure were reviewed.  All questions were answered, the patient agrees to proceed.   The patient should continue walking and begin a more formal exercise program.  The patient should continue antiplatelet therapy and aggressive treatment of the lipid abnormalities  The patient will follow up with me after the angiogram.     [name], MD  01/16/2018 9:12 AM    This note was created with Dragon medical transcription system.  Any errors from dictation are purely unintentional

## 2018-01-16 NOTE — Patient Instructions (Signed)

## 2018-01-16 NOTE — Assessment & Plan Note (Addendum)
His non-invasive studies today show a right ABI of 0.97 and a left ABI of 0.66 with poor waveforms.  His left digital pressures were marked reduced as well and duplex showed SFA/popliteal disease as well as TP trunk occlusion and tibial disease.    Recommend:  The patient has experienced increased symptoms and is now describing lifestyle limiting claudication and mild rest pain.   Given the severity of the patient's left lower extremity symptoms the patient should undergo angiography and intervention.  Risk and benefits were reviewed the patient.  Indications for the procedure were reviewed.  All questions were answered, the patient agrees to proceed.   The patient should continue walking and begin a more formal exercise program.  The patient should continue antiplatelet therapy and aggressive treatment of the lipid abnormalities  The patient will follow up with me after the angiogram.

## 2018-01-18 ENCOUNTER — Ambulatory Visit: Payer: PPO | Admitting: Cardiology

## 2018-01-30 DIAGNOSIS — H2511 Age-related nuclear cataract, right eye: Secondary | ICD-10-CM | POA: Diagnosis not present

## 2018-02-01 ENCOUNTER — Other Ambulatory Visit: Payer: Self-pay

## 2018-02-02 NOTE — Discharge Instructions (Signed)

## 2018-02-02 NOTE — Anesthesia Preprocedure Evaluation (Addendum)
Anesthesia Evaluation    Reviewed: H&P   Airway Mallampati: II  TM Distance: >3 FB Neck ROM: full    Dental no notable dental hx.    Pulmonary former smoker,           Cardiovascular hypertension, + CAD, + Past MI (2009), + Cardiac Stents (2017) and + Peripheral Vascular Disease (carotid stenosis)    Left heart cath 11/27/17 by Dr. Fletcher Anon: Recommendations: The patient's chest pain does not seem to be cardiac. He does have cardiomyopathy which seems to be nonischemic. Continue treatment with a beta-blocker and ACE inhibitor. Uptitrate as tolerated.  ECG 11/26/17: SR, PVCs, LBBB  Myocardial perfusion 11/25/17:  Abnormal, potentially high risk pharmacologic myocardial perfusion stress test. There is a large in size, mild to moderately severe, minimally reversible defect involving the septum and anterior walls, as well as the apex. While this may represent artifact from LBBB, anterior and apical involvement raises the possibility of scar with periinfarct ischemia. The left ventricular ejection fraction is severely decreased (29%) with global hypokinesis and a dyskinetic septum.   Neuro/Psych    GI/Hepatic hiatal hernia, GERD  ,Esophageal stenosis   Endo/Other    Renal/GU      Musculoskeletal  (+) Arthritis ,   Abdominal   Peds  Hematology   Anesthesia Other Findings Cardiac clearance 11/28/17:  OK to proceed with surgery.  Continue aspirin during perioperative period. Nelva Bush, MD [facility] Pager: [phone]  Cardiology note 12/07/17:  ASSESSMENT AND PLAN: Coronary artery disease without angina No further chest pain.  Catheterization was reassuring with patent LCx stent and no other significant disease.  Continue aggressive secondary prevention.  Nonischemic cardiomyopathy LVEF noted to be mildly to moderately reduced during hospitalization in the setting of left bundle branch block.  [name]  appears euvolemic and well compensated with NYHA class II symptoms (leg pain is his greatest limiting factor).  We have agreed to continue current dose of metoprolol and to increase lisinopril to 20 mg daily.  We will plan to recheck a basic metabolic panel in about 2 weeks.  I will also repeat an echocardiogram shortly before he returns to see me in 3 months to see if his LVEF has improved with escalation of evidence-based heart failure therapy.  Carotid artery stenosis Patient has followed in the past with [facility] in [place] but would like to transition to a provider in [place].  I will refer him to [facility] for ongoing management of his cerebrovascular disease.  Leg pain This is likely multifactorial including arthritis related to left hip issues.  He also notes aching in both distal extremities when walking 200 yards, concerning for claudication.  ABIs were previously ordered through [facility] in [place].  As above, I will refer him to  vein and vascular here in [facility] for further work-up and management.  Hyperlipidemia Patient intolerant of multiple statins.  He should continue follow-up with our research coordinators as part of the CLEAR trial.  Goal LDL less than 70.  Hypertension Blood pressure upper normal.  As above, we will increase lisinopril with repeat BMP in about 2 weeks.  Follow-up: Return to clinic in 3 months.  Nelva Bush, MD 12/08/2017  Reproductive/Obstetrics  Anesthesia Evaluation  Patient identified by MRN, date of birth, ID band Patient awake    Reviewed: Allergy & Precautions, H&P , NPO status , Patient's Chart, lab work & pertinent test results  Airway Mallampati: II  TM Distance: >3 FB Neck ROM: full    Dental no notable dental hx.    Pulmonary former smoker (quit 2007),    Pulmonary exam normal breath sounds clear to  auscultation       Cardiovascular hypertension, + CAD, + Past MI (2009), + Cardiac Stents (2017) and + Peripheral Vascular Disease (carotid stenosis)  Normal cardiovascular exam Rhythm:regular Rate:Normal  Left heart cath 11/27/17 by Dr. Fletcher Anon: Recommendations: The patient's chest pain does not seem to be cardiac. He does have cardiomyopathy which seems to be nonischemic. Continue treatment with a beta-blocker and ACE inhibitor. Uptitrate as tolerated.  ECG 11/26/17: SR, PVCs, LBBB  Myocardial perfusion 11/25/17:  Abnormal, potentially high risk pharmacologic myocardial perfusion stress test. There is a large in size, mild to moderately severe, minimally reversible defect involving the septum and anterior walls, as well as the apex. While this may represent artifact from LBBB, anterior and apical involvement raises the possibility of scar with periinfarct ischemia. The left ventricular ejection fraction is severely decreased (29%) with global hypokinesis and a dyskinetic septum.   Neuro/Psych    GI/Hepatic hiatal hernia, GERD  ,Esophageal stenosis   Endo/Other    Renal/GU      Musculoskeletal  (+) Arthritis ,   Abdominal   Peds  Hematology   Anesthesia Other Findings Cardiac clearance 11/28/17:  OK to proceed with surgery.  Continue aspirin during perioperative period. Nelva Bush, MD [facility] Pager: [phone]  Reproductive/Obstetrics                            Anesthesia Physical Anesthesia Plan  ASA: III  Anesthesia Plan: MAC   Post-op Pain Management:    Induction:   PONV Risk Score and Plan: 1 and Midazolam and Treatment may vary due to age or medical condition  Airway Management Planned:   Additional Equipment:   Intra-op Plan:   Post-operative Plan:   Informed Consent: I have reviewed the patients History and Physical, chart, labs and discussed the procedure including the risks, benefits and  alternatives for the proposed anesthesia with the patient or authorized representative who has indicated his/her understanding and acceptance.     Plan Discussed with: CRNA  Anesthesia Plan Comments:         Anesthesia Quick Evaluation  Anesthesia Physical  Anesthesia Plan  ASA: III  Anesthesia Plan: MAC   Post-op Pain Management:    Induction:   PONV Risk Score and Plan: 1 and Midazolam and Treatment may vary due to age or medical condition  Airway Management Planned:   Additional Equipment:   Intra-op Plan:   Post-operative Plan:   Informed Consent:   Plan Discussed with:   Anesthesia Plan Comments:         Anesthesia Quick Evaluation

## 2018-02-05 ENCOUNTER — Other Ambulatory Visit (INDEPENDENT_AMBULATORY_CARE_PROVIDER_SITE_OTHER): Payer: Self-pay | Admitting: Vascular Surgery

## 2018-02-06 ENCOUNTER — Ambulatory Visit: Payer: PPO | Admitting: Anesthesiology

## 2018-02-06 ENCOUNTER — Encounter
Admission: RE | Disposition: A | Discharge status: Home / Self Care | Payer: Self-pay | Source: Ambulatory Visit | Attending: Ophthalmology

## 2018-02-06 ENCOUNTER — Ambulatory Visit
Admission: RE | Admit: 2018-02-06 | Discharge: 2018-02-06 | Disposition: A | Discharge status: Home / Self Care | Payer: PPO | Source: Ambulatory Visit | Attending: Ophthalmology | Admitting: Ophthalmology

## 2018-02-06 DIAGNOSIS — I428 Other cardiomyopathies: Secondary | ICD-10-CM | POA: Diagnosis not present

## 2018-02-06 DIAGNOSIS — Z79899 Other long term (current) drug therapy: Secondary | ICD-10-CM | POA: Diagnosis not present

## 2018-02-06 DIAGNOSIS — H25811 Combined forms of age-related cataract, right eye: Secondary | ICD-10-CM | POA: Diagnosis not present

## 2018-02-06 DIAGNOSIS — E785 Hyperlipidemia, unspecified: Secondary | ICD-10-CM | POA: Insufficient documentation

## 2018-02-06 DIAGNOSIS — Z955 Presence of coronary angioplasty implant and graft: Secondary | ICD-10-CM | POA: Diagnosis not present

## 2018-02-06 DIAGNOSIS — I2510 Atherosclerotic heart disease of native coronary artery without angina pectoris: Secondary | ICD-10-CM | POA: Insufficient documentation

## 2018-02-06 DIAGNOSIS — H2511 Age-related nuclear cataract, right eye: Secondary | ICD-10-CM | POA: Diagnosis not present

## 2018-02-06 DIAGNOSIS — Z791 Long term (current) use of non-steroidal anti-inflammatories (NSAID): Secondary | ICD-10-CM | POA: Insufficient documentation

## 2018-02-06 DIAGNOSIS — Z888 Allergy status to other drugs, medicaments and biological substances status: Secondary | ICD-10-CM | POA: Diagnosis not present

## 2018-02-06 DIAGNOSIS — Z7982 Long term (current) use of aspirin: Secondary | ICD-10-CM | POA: Diagnosis not present

## 2018-02-06 DIAGNOSIS — M1990 Unspecified osteoarthritis, unspecified site: Secondary | ICD-10-CM | POA: Diagnosis not present

## 2018-02-06 DIAGNOSIS — I252 Old myocardial infarction: Secondary | ICD-10-CM | POA: Diagnosis not present

## 2018-02-06 DIAGNOSIS — K219 Gastro-esophageal reflux disease without esophagitis: Secondary | ICD-10-CM | POA: Diagnosis not present

## 2018-02-06 DIAGNOSIS — I10 Essential (primary) hypertension: Secondary | ICD-10-CM | POA: Diagnosis not present

## 2018-02-06 HISTORY — PX: CATARACT EXTRACTION W/PHACO: SHX586

## 2018-02-06 SURGERY — PHACOEMULSIFICATION, CATARACT, WITH IOL INSERTION
Anesthesia: Monitor Anesthesia Care | Site: Eye | Laterality: Right

## 2018-02-06 MED ORDER — MOXIFLOXACIN HCL 0.5 % OP SOLN
OPHTHALMIC | Status: DC | PRN
  Administered 2018-02-06: 0.2 mL via OPHTHALMIC

## 2018-02-06 MED ORDER — MIDAZOLAM HCL 2 MG/2ML IJ SOLN
INTRAMUSCULAR | Status: DC | PRN
  Administered 2018-02-06 (×2): 1 mg via INTRAVENOUS

## 2018-02-06 MED ORDER — FENTANYL CITRATE (PF) 100 MCG/2ML IJ SOLN
INTRAMUSCULAR | Status: DC | PRN
  Administered 2018-02-06: 50 ug via INTRAVENOUS

## 2018-02-06 MED ORDER — ACETAMINOPHEN 160 MG/5ML PO SOLN: 325.0000 mg | Freq: Once | ORAL | Status: DC

## 2018-02-06 MED ORDER — EPINEPHRINE PF 1 MG/ML IJ SOLN
INTRAOCULAR | Status: DC | PRN
  Administered 2018-02-06: 67 mL via OPHTHALMIC

## 2018-02-06 MED ORDER — ARMC OPHTHALMIC DILATING DROPS
1.0000 "application " | OPHTHALMIC | Status: DC | PRN
  Administered 2018-02-06 (×3): 1 via OPHTHALMIC

## 2018-02-06 MED ORDER — SODIUM HYALURONATE 10 MG/ML IO SOLN
INTRAOCULAR | Status: DC | PRN
  Administered 2018-02-06: 0.55 mL via INTRAOCULAR

## 2018-02-06 MED ORDER — ACETAMINOPHEN 325 MG PO TABS: 325.0000 mg | ORAL_TABLET | Freq: Once | ORAL | Status: DC

## 2018-02-06 MED ORDER — TETRACAINE HCL 0.5 % OP SOLN
1.0000 [drp] | OPHTHALMIC | Status: DC | PRN
  Administered 2018-02-06 (×2): 1 [drp] via OPHTHALMIC

## 2018-02-06 MED ORDER — SODIUM HYALURONATE 23 MG/ML IO SOLN
INTRAOCULAR | Status: DC | PRN
  Administered 2018-02-06: 0.6 mL via INTRAOCULAR

## 2018-02-06 MED ORDER — LACTATED RINGERS IV SOLN: INTRAVENOUS | Status: DC

## 2018-02-06 MED ORDER — LIDOCAINE HCL (PF) 2 % IJ SOLN
INTRAOCULAR | Status: DC | PRN
  Administered 2018-02-06: 1 mL via INTRAOCULAR

## 2018-02-06 SURGICAL SUPPLY — 19 items
CANNULA ANT/CHMB 27G (MISCELLANEOUS) ×2 IMPLANT
CANNULA ANT/CHMB 27GA (MISCELLANEOUS) ×4 IMPLANT
DISSECTOR HYDRO NUCLEUS 50X22 (MISCELLANEOUS) ×2 IMPLANT
GLOVE SURG LX 7.5 STRW (GLOVE) ×1
GLOVE SURG LX STRL 7.5 STRW (GLOVE) ×1 IMPLANT
GLOVE SURG SYN 8.5  E (GLOVE) ×1
GLOVE SURG SYN 8.5 E (GLOVE) ×1 IMPLANT
GLOVE SURG SYN 8.5 PF PI (GLOVE) ×1 IMPLANT
GOWN STRL REUS W/ TWL LRG LVL3 (GOWN DISPOSABLE) ×2 IMPLANT
GOWN STRL REUS W/TWL LRG LVL3 (GOWN DISPOSABLE) ×4
LENS IOL TECNIS ITEC 22.0 (Intraocular Lens) ×1 IMPLANT
MARKER SKIN DUAL TIP RULER LAB (MISCELLANEOUS) ×2 IMPLANT
PACK DR. KING ARMS (PACKS) ×2 IMPLANT
PACK EYE AFTER SURG (MISCELLANEOUS) ×2 IMPLANT
PACK OPTHALMIC (MISCELLANEOUS) ×2 IMPLANT
SYR 3ML LL SCALE MARK (SYRINGE) ×2 IMPLANT
SYR TB 1ML LUER SLIP (SYRINGE) ×2 IMPLANT
WATER STERILE IRR 500ML POUR (IV SOLUTION) ×2 IMPLANT
WIPE NON LINTING 3.25X3.25 (MISCELLANEOUS) ×2 IMPLANT

## 2018-02-06 NOTE — Op Note (Signed)
OPERATIVE NOTE  [name] [id number] 02/06/2018   PREOPERATIVE DIAGNOSIS:  Nuclear sclerotic cataract right eye.  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     PROCEDURE:  Phacoemusification with posterior chamber intraocular lens placement of the right eye   LENS:   Implant Name Type Inv. Item Serial No. Manufacturer Lot No. LRB No. Used  LENS IOL DIOP 22.0 - [id number] Intraocular Lens LENS IOL DIOP 22.0 [id number]  Right 1       PCB00 +22.0   ULTRASOUND TIME: 0 minutes 23 seconds.  CDE 2.77   SURGEON:  Benay Pillow, MD, MPH  ANESTHESIOLOGIST: Anesthesiologist: Ronelle Nigh, MD CRNA: Cameron Ali, CRNA   ANESTHESIA:  Topical with tetracaine drops augmented with 1% preservative-free intracameral lidocaine.  ESTIMATED BLOOD LOSS: less than 1 mL.   COMPLICATIONS:  None.   DESCRIPTION OF PROCEDURE:  The patient was identified in the holding room and transported to the operating room and placed in the supine position under the operating microscope.  The right eye was identified as the operative eye and it was prepped and draped in the usual sterile ophthalmic fashion.   A 1.0 millimeter clear-corneal paracentesis was made at the 10:30 position. 0.5 ml of preservative-free 1% lidocaine with epinephrine was injected into the anterior chamber.  The anterior chamber was filled with Healon 5 viscoelastic.  A 2.4 millimeter keratome was used to make a near-clear corneal incision at the 8:00 position.  A curvilinear capsulorrhexis was made with a cystotome and capsulorrhexis forceps.  Balanced salt solution was used to hydrodissect and hydrodelineate the nucleus.   Phacoemulsification was then used in stop and chop fashion to remove the lens nucleus and epinucleus.  The remaining cortex was then removed using the irrigation and aspiration handpiece. Healon was then placed into the capsular bag to distend it for lens placement.  A lens was then  injected into the capsular bag.  The remaining viscoelastic was aspirated.   Wounds were hydrated with balanced salt solution.  The anterior chamber was inflated to a physiologic pressure with balanced salt solution.   Intracameral vigamox 0.1 mL undiluted was injected into the eye and a drop placed onto the ocular surface.  No wound leaks were noted.  The patient was taken to the recovery room in stable condition without complications of anesthesia or surgery  Benay Pillow 02/06/2018, 8:30 AM

## 2018-02-06 NOTE — H&P (Signed)

## 2018-02-06 NOTE — Anesthesia Procedure Notes (Signed)
Procedure Name: MAC Performed by: Carlon Davidson, CRNA Pre-anesthesia Checklist: Patient identified, Emergency Drugs available, Suction available, Timeout performed and Patient being monitored Patient Re-evaluated:Patient Re-evaluated prior to induction Oxygen Delivery Method: Nasal cannula Placement Confirmation: positive ETCO2       

## 2018-02-06 NOTE — Anesthesia Postprocedure Evaluation (Signed)
Anesthesia Post Note  Patient: [name]  Procedure(s) Performed: CATARACT EXTRACTION PHACO AND INTRAOCULAR LENS PLACEMENT (IOC)RIGHT (Right Eye)  Patient location during evaluation: PACU Anesthesia Type: MAC Level of consciousness: awake and alert and oriented Pain management: satisfactory to patient Vital Signs Assessment: post-procedure vital signs reviewed and stable Respiratory status: spontaneous breathing, nonlabored ventilation and respiratory function stable Cardiovascular status: blood pressure returned to baseline and stable Postop Assessment: Adequate PO intake and No signs of nausea or vomiting Anesthetic complications: no    Raliegh Ip

## 2018-02-06 NOTE — Transfer of Care (Signed)
Immediate Anesthesia Transfer of Care Note  Patient: [name]  Procedure(s) Performed: CATARACT EXTRACTION PHACO AND INTRAOCULAR LENS PLACEMENT (IOC)RIGHT (Right Eye)  Patient Location: PACU  Anesthesia Type: MAC  Level of Consciousness: awake, alert  and patient cooperative  Airway and Oxygen Therapy: Patient Spontanous Breathing and Patient connected to supplemental oxygen  Post-op Assessment: Post-op Vital signs reviewed, Patient's Cardiovascular Status Stable, Respiratory Function Stable, Patent Airway and No signs of Nausea or vomiting  Post-op Vital Signs: Reviewed and stable  Complications: No apparent anesthesia complications

## 2018-02-07 ENCOUNTER — Encounter: Payer: Self-pay | Admitting: Ophthalmology

## 2018-02-09 ENCOUNTER — Encounter
Admission: RE | Admit: 2018-02-09 | Discharge: 2018-02-09 | Disposition: A | Discharge status: Home / Self Care | Payer: PPO | Source: Ambulatory Visit | Attending: Vascular Surgery | Admitting: Vascular Surgery

## 2018-02-09 DIAGNOSIS — I252 Old myocardial infarction: Secondary | ICD-10-CM | POA: Diagnosis not present

## 2018-02-09 DIAGNOSIS — E785 Hyperlipidemia, unspecified: Secondary | ICD-10-CM | POA: Diagnosis not present

## 2018-02-09 DIAGNOSIS — I6522 Occlusion and stenosis of left carotid artery: Secondary | ICD-10-CM | POA: Diagnosis not present

## 2018-02-09 DIAGNOSIS — I10 Essential (primary) hypertension: Secondary | ICD-10-CM | POA: Diagnosis not present

## 2018-02-09 DIAGNOSIS — Z01812 Encounter for preprocedural laboratory examination: Secondary | ICD-10-CM | POA: Insufficient documentation

## 2018-02-09 DIAGNOSIS — Z955 Presence of coronary angioplasty implant and graft: Secondary | ICD-10-CM | POA: Diagnosis not present

## 2018-02-09 DIAGNOSIS — I70213 Atherosclerosis of native arteries of extremities with intermittent claudication, bilateral legs: Secondary | ICD-10-CM | POA: Diagnosis not present

## 2018-02-09 DIAGNOSIS — Z79899 Other long term (current) drug therapy: Secondary | ICD-10-CM | POA: Diagnosis not present

## 2018-02-09 DIAGNOSIS — K219 Gastro-esophageal reflux disease without esophagitis: Secondary | ICD-10-CM | POA: Diagnosis not present

## 2018-02-09 DIAGNOSIS — Z87891 Personal history of nicotine dependence: Secondary | ICD-10-CM | POA: Diagnosis not present

## 2018-02-09 DIAGNOSIS — Z8249 Family history of ischemic heart disease and other diseases of the circulatory system: Secondary | ICD-10-CM | POA: Diagnosis not present

## 2018-02-09 DIAGNOSIS — Z823 Family history of stroke: Secondary | ICD-10-CM | POA: Diagnosis not present

## 2018-02-09 DIAGNOSIS — Z888 Allergy status to other drugs, medicaments and biological substances status: Secondary | ICD-10-CM | POA: Diagnosis not present

## 2018-02-09 DIAGNOSIS — Z7982 Long term (current) use of aspirin: Secondary | ICD-10-CM | POA: Diagnosis not present

## 2018-02-09 DIAGNOSIS — M1990 Unspecified osteoarthritis, unspecified site: Secondary | ICD-10-CM | POA: Diagnosis not present

## 2018-02-09 HISTORY — DX: Peripheral vascular disease, unspecified: I73.9

## 2018-02-09 LAB — CREATININE, SERUM
Creatinine, Ser: 0.96 mg/dL (ref 0.61–1.24)
GFR calc Af Amer: >60 mL/min (ref >60)
GFR calc non Af Amer: >60 mL/min (ref >60)

## 2018-02-09 LAB — BUN: BUN: 25 mg/dL — ABNORMAL HIGH (ref 8–23)

## 2018-02-11 MED ORDER — CEFAZOLIN SODIUM-DEXTROSE 2-4 GM/100ML-% IV SOLN
2.0000 g | Freq: Once | INTRAVENOUS | Status: AC
  Administered 2018-02-12: 2 g via INTRAVENOUS

## 2018-02-12 ENCOUNTER — Other Ambulatory Visit: Payer: Self-pay

## 2018-02-12 ENCOUNTER — Encounter
Admission: RE | Disposition: A | Discharge status: Home / Self Care | Payer: Self-pay | Source: Ambulatory Visit | Attending: Vascular Surgery

## 2018-02-12 ENCOUNTER — Ambulatory Visit
Admission: RE | Admit: 2018-02-12 | Discharge: 2018-02-12 | Disposition: A | Discharge status: Home / Self Care | Payer: PPO | Source: Ambulatory Visit | Attending: Vascular Surgery | Admitting: Vascular Surgery

## 2018-02-12 ENCOUNTER — Encounter: Payer: Self-pay | Admitting: Emergency Medicine

## 2018-02-12 DIAGNOSIS — Z955 Presence of coronary angioplasty implant and graft: Secondary | ICD-10-CM | POA: Insufficient documentation

## 2018-02-12 DIAGNOSIS — K219 Gastro-esophageal reflux disease without esophagitis: Secondary | ICD-10-CM | POA: Insufficient documentation

## 2018-02-12 DIAGNOSIS — Z7982 Long term (current) use of aspirin: Secondary | ICD-10-CM | POA: Insufficient documentation

## 2018-02-12 DIAGNOSIS — E785 Hyperlipidemia, unspecified: Secondary | ICD-10-CM | POA: Insufficient documentation

## 2018-02-12 DIAGNOSIS — Z8249 Family history of ischemic heart disease and other diseases of the circulatory system: Secondary | ICD-10-CM | POA: Insufficient documentation

## 2018-02-12 DIAGNOSIS — I739 Peripheral vascular disease, unspecified: Secondary | ICD-10-CM

## 2018-02-12 DIAGNOSIS — I70213 Atherosclerosis of native arteries of extremities with intermittent claudication, bilateral legs: Secondary | ICD-10-CM | POA: Diagnosis not present

## 2018-02-12 DIAGNOSIS — Z823 Family history of stroke: Secondary | ICD-10-CM | POA: Insufficient documentation

## 2018-02-12 DIAGNOSIS — M1990 Unspecified osteoarthritis, unspecified site: Secondary | ICD-10-CM | POA: Insufficient documentation

## 2018-02-12 DIAGNOSIS — I10 Essential (primary) hypertension: Secondary | ICD-10-CM | POA: Insufficient documentation

## 2018-02-12 DIAGNOSIS — I252 Old myocardial infarction: Secondary | ICD-10-CM | POA: Insufficient documentation

## 2018-02-12 DIAGNOSIS — Z888 Allergy status to other drugs, medicaments and biological substances status: Secondary | ICD-10-CM | POA: Insufficient documentation

## 2018-02-12 DIAGNOSIS — Z79899 Other long term (current) drug therapy: Secondary | ICD-10-CM | POA: Insufficient documentation

## 2018-02-12 DIAGNOSIS — Z87891 Personal history of nicotine dependence: Secondary | ICD-10-CM | POA: Insufficient documentation

## 2018-02-12 DIAGNOSIS — I6522 Occlusion and stenosis of left carotid artery: Secondary | ICD-10-CM | POA: Insufficient documentation

## 2018-02-12 HISTORY — PX: LOWER EXTREMITY ANGIOGRAPHY: CATH118251

## 2018-02-12 SURGERY — LOWER EXTREMITY ANGIOGRAPHY
Anesthesia: Moderate Sedation | Laterality: Left

## 2018-02-12 MED ORDER — HEPARIN (PORCINE) IN NACL 1000-0.9 UT/500ML-% IV SOLN
INTRAVENOUS | Status: AC
  Filled 2018-02-12: qty 1000

## 2018-02-12 MED ORDER — SODIUM CHLORIDE FLUSH 0.9 % IV SOLN
INTRAVENOUS | Status: AC
  Filled 2018-02-12: qty 50

## 2018-02-12 MED ORDER — HYDRALAZINE HCL 20 MG/ML IJ SOLN: 5.0000 mg | INTRAMUSCULAR | Status: DC | PRN

## 2018-02-12 MED ORDER — SODIUM CHLORIDE 0.9 % IV SOLN: 250.0000 mL | INTRAVENOUS | Status: DC | PRN

## 2018-02-12 MED ORDER — ACETAMINOPHEN 325 MG PO TABS
ORAL_TABLET | ORAL | Status: AC
  Filled 2018-02-12: qty 2

## 2018-02-12 MED ORDER — HEPARIN SODIUM (PORCINE) 1000 UNIT/ML IJ SOLN
INTRAMUSCULAR | Status: AC
  Filled 2018-02-12: qty 1

## 2018-02-12 MED ORDER — MIDAZOLAM HCL 2 MG/2ML IJ SOLN
INTRAMUSCULAR | Status: DC | PRN
  Administered 2018-02-12 (×2): 1 mg via INTRAVENOUS
  Administered 2018-02-12 (×2): 2 mg via INTRAVENOUS

## 2018-02-12 MED ORDER — SODIUM CHLORIDE 0.9 % IV SOLN: INTRAVENOUS | Status: DC

## 2018-02-12 MED ORDER — ONDANSETRON HCL 4 MG/2ML IJ SOLN: 4.0000 mg | Freq: Four times a day (QID) | INTRAMUSCULAR | Status: DC | PRN

## 2018-02-12 MED ORDER — CLOPIDOGREL BISULFATE 75 MG PO TABS: 75.0000 mg | ORAL_TABLET | Freq: Every day | ORAL | Status: DC

## 2018-02-12 MED ORDER — FENTANYL CITRATE (PF) 100 MCG/2ML IJ SOLN
INTRAMUSCULAR | Status: DC | PRN
  Administered 2018-02-12: 25 ug via INTRAVENOUS
  Administered 2018-02-12 (×2): 50 ug via INTRAVENOUS
  Administered 2018-02-12: 25 ug via INTRAVENOUS

## 2018-02-12 MED ORDER — METHYLPREDNISOLONE SODIUM SUCC 125 MG IJ SOLR: 125.0000 mg | INTRAMUSCULAR | Status: DC | PRN

## 2018-02-12 MED ORDER — SODIUM CHLORIDE 0.9% FLUSH: 3.0000 mL | INTRAVENOUS | Status: DC | PRN

## 2018-02-12 MED ORDER — CLOPIDOGREL BISULFATE 75 MG PO TABS: 75.0000 mg | ORAL_TABLET | Freq: Every day | ORAL | 11 refills | Status: DC

## 2018-02-12 MED ORDER — MIDAZOLAM HCL 5 MG/5ML IJ SOLN
INTRAMUSCULAR | Status: AC
  Filled 2018-02-12: qty 5

## 2018-02-12 MED ORDER — SODIUM CHLORIDE 0.9 % IV SOLN
INTRAVENOUS | Status: DC
  Administered 2018-02-12: 07:00:00 via INTRAVENOUS

## 2018-02-12 MED ORDER — LABETALOL HCL 5 MG/ML IV SOLN: 10.0000 mg | INTRAVENOUS | Status: DC | PRN

## 2018-02-12 MED ORDER — LIDOCAINE-EPINEPHRINE (PF) 1 %-1:200000 IJ SOLN
INTRAMUSCULAR | Status: AC
  Filled 2018-02-12: qty 10

## 2018-02-12 MED ORDER — HYDROMORPHONE HCL 1 MG/ML IJ SOLN: 1.0000 mg | Freq: Once | INTRAMUSCULAR | Status: DC | PRN

## 2018-02-12 MED ORDER — FAMOTIDINE 20 MG PO TABS: 40.0000 mg | ORAL_TABLET | ORAL | Status: DC | PRN

## 2018-02-12 MED ORDER — FENTANYL CITRATE (PF) 100 MCG/2ML IJ SOLN
INTRAMUSCULAR | Status: AC
  Filled 2018-02-12: qty 2

## 2018-02-12 MED ORDER — IOPAMIDOL (ISOVUE-300) INJECTION 61%
INTRAVENOUS | Status: DC | PRN
  Administered 2018-02-12: 100 mL via INTRA_ARTERIAL

## 2018-02-12 MED ORDER — HEPARIN SODIUM (PORCINE) 1000 UNIT/ML IJ SOLN
INTRAMUSCULAR | Status: DC | PRN
  Administered 2018-02-12: 5000 [IU] via INTRAVENOUS

## 2018-02-12 MED ORDER — SODIUM CHLORIDE 0.9% FLUSH: 3.0000 mL | Freq: Two times a day (BID) | INTRAVENOUS | Status: DC

## 2018-02-12 MED ORDER — ACETAMINOPHEN 325 MG PO TABS
650.0000 mg | ORAL_TABLET | ORAL | Status: DC | PRN
  Administered 2018-02-12: 650 mg via ORAL

## 2018-02-12 SURGICAL SUPPLY — 23 items
BALLN LUTONIX DCB 5X60X130 (BALLOONS) ×2
BALLN LUTONIX DCB 7X60X130 (BALLOONS) ×2
BALLN ULTRVRSE 7X60X75C (BALLOONS) ×2
BALLOON LUTONIX DCB 5X60X130 (BALLOONS) IMPLANT
BALLOON LUTONIX DCB 7X60X130 (BALLOONS) IMPLANT
BALLOON ULTRVRSE 7X60X75C (BALLOONS) IMPLANT
CATH BEACON 5 .035 40 KMP TP (CATHETERS) IMPLANT
CATH BEACON 5 .038 100 VERT TP (CATHETERS) ×1 IMPLANT
CATH BEACON 5 .038 40 KMP TP (CATHETERS) ×1
CATH CXI SUPP ANG 4FR 135 (CATHETERS) IMPLANT
CATH CXI SUPP ANG 4FR 135CM (CATHETERS) ×2
CATH PIG 70CM (CATHETERS) ×1 IMPLANT
DEVICE PRESTO INFLATION (MISCELLANEOUS) ×1 IMPLANT
DEVICE STARCLOSE SE CLOSURE (Vascular Products) ×1 IMPLANT
DEVICE TORQUE .025-.038 (MISCELLANEOUS) ×1 IMPLANT
GLIDEWIRE ADV .035X260CM (WIRE) ×1 IMPLANT
PACK ANGIOGRAPHY (CUSTOM PROCEDURE TRAY) ×2 IMPLANT
SHEATH ANL2 6FRX45 HC (SHEATH) ×1 IMPLANT
SHEATH BRITE TIP 5FRX11 (SHEATH) ×1 IMPLANT
STENT LIFESTAR 8X60X80 (Permanent Stent) ×1 IMPLANT
SYR MEDRAD MARK V 150ML (SYRINGE) ×1 IMPLANT
TUBING CONTRAST HIGH PRESS 72 (TUBING) ×1 IMPLANT
WIRE J 3MM .035X145CM (WIRE) ×1 IMPLANT

## 2018-02-12 NOTE — Op Note (Signed)
[facility]  Percutaneous Study/Intervention Procedural Note   Date of Surgery: 02/12/2018  Surgeon(s):DEW,JASON    Assistants:none  Pre-operative Diagnosis: PAD with claudication bilateral lower extermities  Post-operative diagnosis:  Same  Procedure(s) Performed:             1.  Ultrasound guidance for vascular access right femoral artery             2.  Catheter placement into left common femoral artery from right femoral approach             3.  Aortogram and selective left lower extremity angiogram             4.  Percutaneous transluminal angioplasty of right external iliac artery with 7 mm diameter by 6 cm length Lutonix drug-coated angioplasty balloon             5.   Percutaneous transluminal angioplasty of the left profunda femoris artery and common femoral artery with 5 mm diameter by 6 cm length Lutonix drug-coated angioplasty balloon  6.  Self-expanding stent placement to the right external iliac artery with 8 mm diameter by 6 cm length life star stent             7.  StarClose closure device right femoral artery  EBL: 10 cc  Contrast: 100 cc  Fluoro Time: 16.3 minutes  Moderate Conscious Sedation Time: approximately 50 minutes using 6 mg of Versed and 150 Mcg of Fentanyl              Indications:  Patient is a 67 y.o.male with disabling claudication symptoms bilaterally. The patient has noninvasive study showing reduced perfusion in both lower extremities. The patient is brought in for angiography for further evaluation and potential treatment. Risks and benefits are discussed and informed consent is obtained.   Procedure:  The patient was identified and appropriate procedural time out was performed.  The patient was then placed supine on the table and prepped and draped in the usual sterile fashion. Moderate conscious sedation was administered during a face to face encounter with the patient throughout the procedure with my supervision of the  RN administering medicines and monitoring the patient's vital signs, pulse oximetry, telemetry and mental status throughout from the start of the procedure until the patient was taken to the recovery room. Ultrasound was used to evaluate the right common femoral artery.  It was patent .  A digital ultrasound image was acquired.  A Seldinger needle was used to access the right common femoral artery under direct ultrasound guidance and a permanent image was performed.  A 0.035 J wire was advanced without resistance and a 5Fr sheath was placed.  Pigtail catheter was placed into the aorta and an AP aortogram was performed. This demonstrated normal renal arteries bilaterally.  The aorta was calcific but not stenotic.  The left iliac system did not have any significant stenosis.  The right common iliac artery was patent but there was a very calcific nearly occlusive stenosis in the right external neck artery of greater than 90%. I then crossed the aortic bifurcation and advanced to the left femoral head. Selective left lower extremity angiogram was then performed. This demonstrated 85 to 90% stenosis of the left profunda femoris artery with a calcific lesion in the common femoral artery although the stenosis was less than 50%.  The SFA had some diffuse mild disease but then occluded just below Hunter's canal in the popliteal artery with reconstitution of  the distal popliteal artery just above the anterior tibial artery.  The anterior tibial artery and posterior tibial arteries were then continuous to the foot although there appeared to be some degree of disease in the tibioperoneal trunk. It was felt that it was in the patient's best interest to proceed with intervention after these images to avoid a second procedure and a larger amount of contrast and fluoroscopy based off of the findings from the initial angiogram. The patient was systemically heparinized and I started by using a 7 mm diameter by 6 cm length Lutonix  drug-coated angioplasty balloon in the right external iliac artery.  This was inflated to 10 atm for 1 minute.  Completion imaging showed greater than 50% residual stenosis and this would be treated with a stent after treatment of the left leg.  A 6 French Ansell sheath was then placed over the [employer] wire. I then used a Kumpe catheter and the advantage wire to navigate down into the SFA and approach the popliteal occlusion.  This was a very calcific lesion and despite multiple efforts with the Kumpe catheter, the CXI catheter, and the advantage wire I was never able to successfully cross this occlusion and gain intraluminal flow in the tibial vessels or below-knee popliteal artery.  After multiple dissection planes were created, it was clear this was not going to be successful today.  I felt that treating his profundal lesion to improve his collateral flow would likely significantly improve his symptoms.  If he still has disabling claudication, consideration for a pedal approach or another up and over angiogram would be given.  I then pulled the Kumpe catheter and advantage wire back and of the common femoral artery and cross the profunda femoris lesion without difficulty.  A 5 mm diameter by 6 cm length Lutonix drug-coated angioplasty balloon was inflated to 12 atm in the left profunda femoris artery and back into the common femoral artery.  Completion imaging following this showed only about a 25 to 30% residual stenosis in the left profunda femoris artery that was markedly improved.  I then pulled the sheath back to the right iliac system.  An 8 mm diameter by 6 cm length life star stent was deployed in the right external iliac artery and postdilated with a 7 mm balloon with excellent angiographic completion result and less than 10% residual stenosis. I elected to terminate the procedure. The sheath was removed and StarClose closure device was deployed in the right femoral artery with excellent  hemostatic result. The patient was taken to the recovery room in stable condition having tolerated the procedure well.  Findings:               Aortogram:  Reasonably normal renal arteries bilaterally.  The aorta was calcific but not stenotic.  The left iliac system did not have any significant stenosis.  The right common iliac artery was patent but there was a very calcific nearly occlusive stenosis in the right external neck artery of greater than 90%.             Left Lower Extremity:  This demonstrated 85 to 90% stenosis of the left profunda femoris artery with a calcific lesion in the common femoral artery although the stenosis was less than 50%.  The SFA had some diffuse mild disease but then occluded just below Hunter's canal in the popliteal artery with reconstitution of the distal popliteal artery just above the anterior tibial artery.  The anterior tibial artery and posterior  tibial arteries were then continuous to the foot although there appeared to be some degree of disease in the tibioperoneal trunk.   Disposition: Patient was taken to the recovery room in stable condition having tolerated the procedure well.  Complications: None  [name] 02/12/2018 9:53 AM   This note was created with Dragon Medical transcription system. Any errors in dictation are purely unintentional.

## 2018-02-12 NOTE — H&P (Signed)
[facility] History & Physical Update  The patient was interviewed and re-examined.  The patient's previous History and Physical has been reviewed and is unchanged.  There is no change in the plan of care. We plan to proceed with the scheduled procedure.  [name], MD  02/12/2018, 7:27 AM

## 2018-02-19 ENCOUNTER — Other Ambulatory Visit: Payer: Self-pay | Admitting: Cardiology

## 2018-02-19 MED ORDER — LISINOPRIL 20 MG PO TABS: 20.0000 mg | ORAL_TABLET | Freq: Every day | ORAL | 2 refills | Status: DC

## 2018-02-27 ENCOUNTER — Other Ambulatory Visit: Payer: Self-pay

## 2018-02-27 ENCOUNTER — Ambulatory Visit (INDEPENDENT_AMBULATORY_CARE_PROVIDER_SITE_OTHER): Payer: PPO

## 2018-02-27 DIAGNOSIS — I428 Other cardiomyopathies: Secondary | ICD-10-CM

## 2018-02-27 MED ORDER — PERFLUTREN LIPID MICROSPHERE
1.0000 mL | INTRAVENOUS | Status: AC | PRN
  Administered 2018-02-27: 2 mL via INTRAVENOUS

## 2018-03-01 ENCOUNTER — Telehealth: Payer: Self-pay

## 2018-03-01 ENCOUNTER — Other Ambulatory Visit (INDEPENDENT_AMBULATORY_CARE_PROVIDER_SITE_OTHER): Payer: PPO

## 2018-03-01 ENCOUNTER — Ambulatory Visit (INDEPENDENT_AMBULATORY_CARE_PROVIDER_SITE_OTHER): Payer: PPO | Admitting: Nurse Practitioner

## 2018-03-01 ENCOUNTER — Encounter (INDEPENDENT_AMBULATORY_CARE_PROVIDER_SITE_OTHER): Payer: Self-pay

## 2018-03-01 ENCOUNTER — Encounter

## 2018-03-01 ENCOUNTER — Telehealth (INDEPENDENT_AMBULATORY_CARE_PROVIDER_SITE_OTHER): Payer: Self-pay

## 2018-03-01 ENCOUNTER — Other Ambulatory Visit (INDEPENDENT_AMBULATORY_CARE_PROVIDER_SITE_OTHER): Payer: Self-pay | Admitting: Vascular Surgery

## 2018-03-01 ENCOUNTER — Encounter (INDEPENDENT_AMBULATORY_CARE_PROVIDER_SITE_OTHER): Payer: Self-pay | Admitting: Nurse Practitioner

## 2018-03-01 VITALS — BP 107/60 | HR 60 | Resp 19 | Ht 66.0 in | Wt 183.0 lb

## 2018-03-01 DIAGNOSIS — E785 Hyperlipidemia, unspecified: Secondary | ICD-10-CM

## 2018-03-01 DIAGNOSIS — M79671 Pain in right foot: Secondary | ICD-10-CM

## 2018-03-01 DIAGNOSIS — Z95820 Peripheral vascular angioplasty status with implants and grafts: Secondary | ICD-10-CM | POA: Diagnosis not present

## 2018-03-01 DIAGNOSIS — I739 Peripheral vascular disease, unspecified: Secondary | ICD-10-CM

## 2018-03-01 DIAGNOSIS — M79672 Pain in left foot: Secondary | ICD-10-CM

## 2018-03-01 DIAGNOSIS — I10 Essential (primary) hypertension: Secondary | ICD-10-CM

## 2018-03-01 DIAGNOSIS — Z09 Encounter for follow-up examination after completed treatment for conditions other than malignant neoplasm: Secondary | ICD-10-CM

## 2018-03-01 NOTE — Telephone Encounter (Deleted)
I spoke with the patient's daughter Ms. Lovena Le and gave her the surgery information as well as the pre-op information. I let her know that I would be mailing this information out to her as well.

## 2018-03-01 NOTE — Telephone Encounter (Signed)
-----   Message from Nelva Bush, MD sent at 02/27/2018  8:17 PM EST ----- Please let [name] know that his echo shows that his heart is pumping function remains moderately reduced which is similar or slightly worse than in September.  Do not recommend any medication changes at this time, though we will need to readdress this when he returns for follow-up in 2 weeks.

## 2018-03-01 NOTE — Telephone Encounter (Signed)
Spoke with the patient and gave all instructions as well they will be mailed out to the patient.

## 2018-03-01 NOTE — Telephone Encounter (Signed)
Call to patient to review notes from Dr. Saunders Revel. Pt verbalized understanding.  Will report for scheduled f/u. Advised pt to call for any further questions or concerns. No new orders at this time.

## 2018-03-01 NOTE — Progress Notes (Signed)
Subjective:    Patient ID: [name], male    DOB: 1950/08/09, 68 y.o.   MRN: [id number] Chief Complaint  Patient presents with  . Follow-up    HPI  [name] is a 68 y.o. male is following up today after angiogram done on 02/12/2018 of his left lower extremity.  Today he reports feeling as if the pain in his left lower extremity is worse.  He states that he has pain in his calf with ambulation, however it is much worse when he is laying flat.  He states that the pain is constant and he is unable to rest due to this, he denies any fever, chills, nausea or vomiting.  He denies any chest pain or shortness of breath.   Denies any TIA-like symptoms or amaurosis fugax.  He denies tobacco use.  He underwent bilateral ABIs today which reveal an ABI of 1.09 on his right lower extremity and an ABI of 0.58 on his left.  He has a TBI of 0.47 on his right lower extremity and 0.09 on his left.  His previous ABIs were 0.97 on his right and 0.66 on his left.  The TBI on his left lower extremity was 0.23 previously.  He has triphasic waveforms in his right tibial arteries.  His left anterior tibial artery is biphasic with a monophasic posterior tibial artery.  He has nearly flat toe waveforms on his left lower extremity.  Past Medical History:  Diagnosis Date  . Abnormal nuclear cardiac imaging test 08/08/2015  . Arthritis    fingers  . Carotid artery occlusion   . Coronary atherosclerosis of native coronary artery 01/29/2013   stent  . Duodenal erosion   . Encounter for screening for lung cancer 07/13/2016  . Esophageal stenosis    esophageal dilation  . GERD (gastroesophageal reflux disease)   . H. pylori infection   . Heart attack [facility]) Oct. 2009   Mild  . Hiatal hernia   . Hyperlipidemia   . Hypertension   . 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 Cardiac catheterization-nonobstructive 60% RCA disease-subsequent nuclear stress  test-9 minutes, low risk, mild inferior wall hypokinesis   . Peripheral vascular disease [facility])     Past Surgical History:  Procedure Laterality Date  . APPENDECTOMY    . BACK SURGERY    . CARDIAC CATHETERIZATION N/A 08/07/2015   Procedure: Left Heart Cath and Coronary Angiography;  Surgeon: [name], MD;  Location: [place] [facility];  Service: Cardiovascular;  Laterality: N/A;  . CARDIAC CATHETERIZATION N/A 08/07/2015   Procedure: Coronary Stent Intervention;  Surgeon: [name], MD;  Location: [place] [facility];  Service: Cardiovascular;  Laterality: N/A;  . CARDIAC CATHETERIZATION N/A 08/07/2015   Procedure: Coronary Stent Intervention;  Surgeon: [name], MD;  Location: [place] [facility];  Service: Cardiovascular;  Laterality: N/A;  . CAROTID ENDARTERECTOMY  01/05/2006   Right  CEA with DPA  . CATARACT EXTRACTION W/ INTRAOCULAR LENS IMPLANT Left 12/04/2017  . CATARACT EXTRACTION W/PHACO Left 12/04/2017   Procedure: CATARACT EXTRACTION PHACO AND INTRAOCULAR LENS PLACEMENT ([place]) LEFT;  Surgeon: Eulogio Bear, MD;  Location: [place];  Service: Ophthalmology;  Laterality: Left;  . CATARACT EXTRACTION W/PHACO Right 02/06/2018   Procedure: CATARACT EXTRACTION PHACO AND INTRAOCULAR LENS PLACEMENT (IOC)RIGHT;  Surgeon: Eulogio Bear, MD;  Location: [place];  Service: Ophthalmology;  Laterality: Right;  . COLONOSCOPY    .  CORONARY STENT PLACEMENT  08/07/2015   MID [place]  . HIP SURGERY Left 10/2016   left hip tendon repair  . LEFT HEART CATH AND CORONARY ANGIOGRAPHY N/A 11/27/2017   Procedure: LEFT HEART CATH AND CORONARY ANGIOGRAPHY;  Surgeon: Wellington Hampshire, MD;  Location: [place] [facility];  Service: Cardiovascular;  Laterality: N/A;  . LOWER EXTREMITY ANGIOGRAPHY Left 02/12/2018   Procedure: LOWER EXTREMITY ANGIOGRAPHY;  Surgeon: Algernon Huxley, MD;  Location: [place] [facility];  Service: Cardiovascular;  Laterality: Left;  . SPINE  SURGERY    . TONSILLECTOMY      Social History   Socioeconomic History  . Marital status: Married    Spouse name: Not on file  . Number of children: 1  . Years of education: Not on file  . Highest education level: Not on file  Occupational History  . Not on file  Social Needs  . Financial resource strain: Not on file  . Food insecurity:    Worry: Not on file    Inability: Not on file  . Transportation needs:    Medical: Not on file    Non-medical: Not on file  Tobacco Use  . Smoking status: Former Smoker    Packs/day: 1.25    Years: 35.00    Pack years: 43.75    Types: Cigarettes    Last attempt to quit: 02/28/2005    Years since quitting: 13.0  . Smokeless tobacco: Current User    Types: Snuff  Substance and Sexual Activity  . Alcohol use: Yes    Alcohol/week: 8.0 - 10.0 standard drinks    Types: 8 - 10 Glasses of wine per week  . Drug use: No  . Sexual activity: Yes  Lifestyle  . Physical activity:    Days per week: Not on file    Minutes per session: Not on file  . Stress: Not on file  Relationships  . Social connections:    Talks on phone: Not on file    Gets together: Not on file    Attends religious service: Not on file    Active member of club or organization: Not on file    Attends meetings of clubs or organizations: Not on file    Relationship status: Not on file  . Intimate partner violence:    Fear of current or ex partner: Not on file    Emotionally abused: Not on file    Physically abused: Not on file    Forced sexual activity: Not on file  Other Topics Concern  . Not on file  Social History Narrative  . Not on file    Family History  Problem Relation Age of Onset  . Heart attack Mother   . Coronary artery disease Mother   . Heart disease Mother        Carotid Stenosis and BPG and Heart Disease before age 19  . Diabetes Mother   . Hypertension Mother   . Heart attack Father   . Heart disease Father        BPG and Heart Disease before  age 98  . Hypertension Father   . Cancer Father 55       throat  . Stroke Father   . Colon cancer Neg Hx   . Colon polyps Neg Hx   . Esophageal cancer Neg Hx   . Rectal cancer Neg Hx   . Stomach cancer Neg Hx     Allergies  Allergen Reactions  . Brilinta [  Ticagrelor]     Shortness of breath  . Zetia [Ezetimibe] Other (See Comments)    Muscle aches  . Statins Other (See Comments)    Failed Crestor 5 mg twice weekly, Crestor 20 mg daily, Pravastatin 40 mg qd, Lipitor, Zocor - muscle aches     Review of Systems   Review of Systems: Negative Unless Checked Constitutional: [] Weight loss  [] Fever  [] Chills Cardiac: [] Chest pain   []  Atrial Fibrillation  [] Palpitations   [] Shortness of breath when laying flat   [] Shortness of breath with exertion. [] Shortness of breath at rest Vascular:  [x] Pain in legs with walking   [] Pain in legs with standing [] Pain in legs when laying flat   [x] Claudication    [x] Pain in feet when laying flat    [] History of DVT   [] Phlebitis   [] Swelling in legs   [] Varicose veins   [] Non-healing ulcers Pulmonary:   [] Uses home oxygen   [] Productive cough   [] Hemoptysis   [] Wheeze  [] COPD   [] Asthma Neurologic:  [] Dizziness   [] Seizures  [] Blackouts [] History of stroke   [] History of TIA  [] Aphasia   [] Temporary Blindness   [] Weakness or numbness in arm   [x] Weakness or numbness in leg Musculoskeletal:   [] Joint swelling   [] Joint pain   [] Low back pain  []  History of Knee Replacement [] Arthritis [] back Surgeries  []  Spinal Stenosis    Hematologic:  [] Easy bruising  [] Easy bleeding   [] Hypercoagulable state   [] Anemic Gastrointestinal:  [] Diarrhea   [] Vomiting  [] Gastroesophageal reflux/heartburn   [] Difficulty swallowing. [] Abdominal pain Genitourinary:  [] Chronic kidney disease   [] Difficult urination  [] Anuric   [] Blood in urine [] Frequent urination  [] Burning with urination   [] Hematuria Skin:  [] Rashes   [] Ulcers [] Wounds Psychological:  [] History of anxiety    []  History of major depression  []  Memory Difficulties     Objective:   Physical Exam  BP 107/60 (BP Location: Right Arm)   Pulse 60   Resp 19   Ht 5\' 6"  (1.676 m)   Wt 183 lb (83 kg)   BMI 29.54 kg/m   Gen: WD/WN, NAD Head: [place]/AT, No temporalis wasting.  Ear/Nose/Throat: Hearing grossly intact, nares w/o erythema or drainage Eyes: PER, EOMI, sclera nonicteric.  Neck: Supple, no masses.  No JVD.  Pulmonary:  Good air movement, no use of accessory muscles.  Cardiac: RRR Vascular:  Vessel Right Left  Radial Palpable Palpable  Dorsalis Pedis Palpable  not palpable  Posterior Tibial Palpable  not palpable   Gastrointestinal: soft, non-distended. No guarding/no peritoneal signs.  Musculoskeletal: M/S 5/5 throughout.  No deformity or atrophy.  Neurologic: Pain and light touch intact in extremities.  Symmetrical.  Speech is fluent. Motor exam as listed above. Psychiatric: Judgment intact, Mood & affect appropriate for pt's clinical situation. Dermatologic: No Venous rashes. No Ulcers Noted.  No changes consistent with cellulitis. Lymph : No Cervical lymphadenopathy, no lichenification or skin changes of chronic lymphedema.      Assessment & Plan:   1. 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 ([place]) He underwent bilateral ABIs today which reveal an ABI of 1.09 on his right lower extremity and an ABI of 0.58 on his left.  He has a TBI of 0.47 on his right lower extremity and 0.09 on his left.  His previous ABIs were 0.97 on his right and 0.66 on his left.  The TBI on his left lower extremity was 0.23 previously.  He has triphasic waveforms in his  right tibial arteries.  His left anterior tibial artery is biphasic with a monophasic posterior tibial artery.  He has nearly flat toe waveforms on his left lower extremity.   Recommend:  The patient has evidence of severe atherosclerotic changes of both lower extremities with rest pain that is associated with  preulcerative changes and impending tissue loss of the foot.  This represents a limb threatening ischemia and places the patient at the risk for limb loss.  Patient should undergo angiography of the lower extremities with the hope for intervention for limb salvage.  The risks and benefits as well as the alternative therapies was discussed in detail with the patient.  All questions were answered.  Patient agrees to proceed with angiography.  The patient will follow up with me in the office after the procedure.     2. Hyperlipidemia LDL goal <70 Continue statin as ordered and reviewed, no changes at this time   3. Essential hypertension Continue antihypertensive medications as already ordered, these medications have been reviewed and there are no changes at this time.    Current Outpatient Medications on File Prior to Visit  Medication Sig Dispense Refill  . AMBULATORY NON FORMULARY MEDICATION Take 180 mg by mouth daily. Medication Name: bempedoic acid vs placebo. CLEAR Research study drug provided    . amLODipine (NORVASC) 5 MG tablet Take 1 tablet (5 mg total) by mouth daily. 90 tablet 3  . aspirin EC 81 MG tablet Take 1 tablet (81 mg total) by mouth daily. 90 tablet 3  . clopidogrel (PLAVIX) 75 MG tablet Take 1 tablet (75 mg total) by mouth daily. 30 tablet 11  . gabapentin (NEURONTIN) 100 MG capsule Take 2 capsules (200 mg total) by mouth at bedtime. (Patient taking differently: Take 200 mg by mouth as needed. 3 times a week) 180 capsule 1  . ibuprofen (ADVIL,MOTRIN) 400 MG tablet Take 400 mg by mouth as needed. 2-3 times per week    . lisinopril (PRINIVIL,ZESTRIL) 20 MG tablet Take 1 tablet (20 mg total) by mouth daily. 90 tablet 2  . meloxicam (MOBIC) 7.5 MG tablet Take 2 tablets (15 mg total) by mouth daily. Take 1-2 tablets daily by mouth for severe joint pains (Patient taking differently: Take 15 mg by mouth daily. Take 1-2 tablets daily by mouth for severe joint pains PRN) 180 tablet  0  . metoprolol succinate (TOPROL-XL) 25 MG 24 hr tablet Take 1 tablet (25 mg total) by mouth daily. 90 tablet 3  . nitroGLYCERIN (NITROSTAT) 0.4 MG SL tablet Place 1 tablet (0.4 mg total) under the tongue every 5 (five) minutes as needed for chest pain. 25 tablet 2  . pantoprazole (PROTONIX) 40 MG tablet Take 1 tablet (40 mg total) by mouth daily. 90 tablet 3  . traMADol (ULTRAM) 50 MG tablet Take 1 tablet (50 mg total) by mouth every 6 (six) hours as needed for moderate pain. 30 tablet 1  . cyclobenzaprine (FLEXERIL) 10 MG tablet Take 1 tablet (10 mg total) by mouth at bedtime. (Patient not taking: Reported on 03/01/2018) 30 tablet 0   No current facility-administered medications on file prior to visit.     There are no Patient Instructions on file for this visit. No follow-ups on file.   Kris Hartmann, NP  This note was completed with Sales executive.  Any errors are purely unintentional.

## 2018-03-05 ENCOUNTER — Telehealth (INDEPENDENT_AMBULATORY_CARE_PROVIDER_SITE_OTHER): Payer: Self-pay

## 2018-03-05 NOTE — Telephone Encounter (Signed)
Spoke with the patient to reschedule his angio from 03/08/2018 to 03/07/2018 with a 2:00 pm arrival time and being NPO as well. Patient was in agreement.

## 2018-03-06 ENCOUNTER — Other Ambulatory Visit (INDEPENDENT_AMBULATORY_CARE_PROVIDER_SITE_OTHER): Payer: Self-pay | Admitting: Nurse Practitioner

## 2018-03-06 ENCOUNTER — Telehealth (INDEPENDENT_AMBULATORY_CARE_PROVIDER_SITE_OTHER): Payer: Self-pay

## 2018-03-06 MED ORDER — DEXTROSE 5 % IV SOLN
2.0000 g | Freq: Once | INTRAVENOUS | Status: AC
  Administered 2018-03-07: 2 g via INTRAVENOUS
  Filled 2018-03-06: qty 20

## 2018-03-06 NOTE — Telephone Encounter (Signed)
Spoke with the patient and gave him his new arrival time for his procedure with Dr. Lucky Cowboy on 03/07/2018 of 1:15 pm.

## 2018-03-07 ENCOUNTER — Ambulatory Visit
Admission: RE | Admit: 2018-03-07 | Discharge: 2018-03-07 | Disposition: A | Discharge status: Home / Self Care | Payer: PPO | Attending: Vascular Surgery | Admitting: Vascular Surgery

## 2018-03-07 ENCOUNTER — Encounter
Admission: RE | Disposition: A | Discharge status: Home / Self Care | Payer: Self-pay | Source: Home / Self Care | Attending: Vascular Surgery

## 2018-03-07 ENCOUNTER — Other Ambulatory Visit: Payer: Self-pay

## 2018-03-07 ENCOUNTER — Other Ambulatory Visit: Payer: PPO

## 2018-03-07 DIAGNOSIS — Z87891 Personal history of nicotine dependence: Secondary | ICD-10-CM | POA: Insufficient documentation

## 2018-03-07 DIAGNOSIS — Z888 Allergy status to other drugs, medicaments and biological substances status: Secondary | ICD-10-CM | POA: Diagnosis not present

## 2018-03-07 DIAGNOSIS — Z823 Family history of stroke: Secondary | ICD-10-CM | POA: Insufficient documentation

## 2018-03-07 DIAGNOSIS — I2510 Atherosclerotic heart disease of native coronary artery without angina pectoris: Secondary | ICD-10-CM | POA: Insufficient documentation

## 2018-03-07 DIAGNOSIS — K219 Gastro-esophageal reflux disease without esophagitis: Secondary | ICD-10-CM | POA: Insufficient documentation

## 2018-03-07 DIAGNOSIS — I70212 Atherosclerosis of native arteries of extremities with intermittent claudication, left leg: Secondary | ICD-10-CM | POA: Diagnosis not present

## 2018-03-07 DIAGNOSIS — Z7902 Long term (current) use of antithrombotics/antiplatelets: Secondary | ICD-10-CM | POA: Insufficient documentation

## 2018-03-07 DIAGNOSIS — I252 Old myocardial infarction: Secondary | ICD-10-CM | POA: Insufficient documentation

## 2018-03-07 DIAGNOSIS — E785 Hyperlipidemia, unspecified: Secondary | ICD-10-CM | POA: Insufficient documentation

## 2018-03-07 DIAGNOSIS — Z79899 Other long term (current) drug therapy: Secondary | ICD-10-CM | POA: Insufficient documentation

## 2018-03-07 DIAGNOSIS — Z8249 Family history of ischemic heart disease and other diseases of the circulatory system: Secondary | ICD-10-CM | POA: Insufficient documentation

## 2018-03-07 DIAGNOSIS — I10 Essential (primary) hypertension: Secondary | ICD-10-CM | POA: Insufficient documentation

## 2018-03-07 DIAGNOSIS — Z955 Presence of coronary angioplasty implant and graft: Secondary | ICD-10-CM | POA: Diagnosis not present

## 2018-03-07 DIAGNOSIS — Z7982 Long term (current) use of aspirin: Secondary | ICD-10-CM | POA: Diagnosis not present

## 2018-03-07 DIAGNOSIS — I70219 Atherosclerosis of native arteries of extremities with intermittent claudication, unspecified extremity: Secondary | ICD-10-CM

## 2018-03-07 HISTORY — PX: LOWER EXTREMITY ANGIOGRAPHY: CATH118251

## 2018-03-07 LAB — CREATININE, SERUM
Creatinine, Ser: 0.88 mg/dL (ref 0.61–1.24)
GFR calc Af Amer: >60 mL/min (ref >60)
GFR calc non Af Amer: >60 mL/min (ref >60)

## 2018-03-07 LAB — BUN: BUN: 30 mg/dL — ABNORMAL HIGH (ref 8–23)

## 2018-03-07 SURGERY — LOWER EXTREMITY ANGIOGRAPHY
Anesthesia: Moderate Sedation | Laterality: Left

## 2018-03-07 MED ORDER — HEPARIN SODIUM (PORCINE) 1000 UNIT/ML IJ SOLN
INTRAMUSCULAR | Status: DC | PRN
  Administered 2018-03-07: 5000 [IU] via INTRAVENOUS

## 2018-03-07 MED ORDER — ONDANSETRON HCL 4 MG/2ML IJ SOLN: 4.0000 mg | Freq: Four times a day (QID) | INTRAMUSCULAR | Status: DC | PRN

## 2018-03-07 MED ORDER — HEPARIN SODIUM (PORCINE) 1000 UNIT/ML IJ SOLN
INTRAMUSCULAR | Status: AC
  Filled 2018-03-07: qty 1

## 2018-03-07 MED ORDER — SODIUM CHLORIDE 0.9% FLUSH: 3.0000 mL | Freq: Two times a day (BID) | INTRAVENOUS | Status: DC

## 2018-03-07 MED ORDER — FENTANYL CITRATE (PF) 100 MCG/2ML IJ SOLN
INTRAMUSCULAR | Status: DC | PRN
  Administered 2018-03-07 (×5): 25 ug via INTRAVENOUS
  Administered 2018-03-07: 50 ug via INTRAVENOUS
  Administered 2018-03-07: 25 ug via INTRAVENOUS

## 2018-03-07 MED ORDER — SODIUM CHLORIDE 0.9% FLUSH: 3.0000 mL | INTRAVENOUS | Status: DC | PRN

## 2018-03-07 MED ORDER — DIPHENHYDRAMINE HCL 50 MG/ML IJ SOLN
INTRAMUSCULAR | Status: AC
  Filled 2018-03-07: qty 1

## 2018-03-07 MED ORDER — HYDRALAZINE HCL 20 MG/ML IJ SOLN: 5.0000 mg | INTRAMUSCULAR | Status: DC | PRN

## 2018-03-07 MED ORDER — SODIUM CHLORIDE 0.9 % IV SOLN: 250.0000 mL | INTRAVENOUS | Status: DC | PRN

## 2018-03-07 MED ORDER — FENTANYL CITRATE (PF) 100 MCG/2ML IJ SOLN
INTRAMUSCULAR | Status: AC
  Filled 2018-03-07: qty 2

## 2018-03-07 MED ORDER — SODIUM CHLORIDE 0.9 % IV SOLN: INTRAVENOUS | Status: DC

## 2018-03-07 MED ORDER — HEPARIN (PORCINE) IN NACL 1000-0.9 UT/500ML-% IV SOLN
INTRAVENOUS | Status: AC
  Filled 2018-03-07: qty 1000

## 2018-03-07 MED ORDER — HYDROMORPHONE HCL 1 MG/ML IJ SOLN: 1.0000 mg | Freq: Once | INTRAMUSCULAR | Status: DC | PRN

## 2018-03-07 MED ORDER — LABETALOL HCL 5 MG/ML IV SOLN: 10.0000 mg | INTRAVENOUS | Status: DC | PRN

## 2018-03-07 MED ORDER — ACETAMINOPHEN 325 MG PO TABS: 650.0000 mg | ORAL_TABLET | ORAL | Status: DC | PRN

## 2018-03-07 MED ORDER — IOPAMIDOL (ISOVUE-300) INJECTION 61%
INTRAVENOUS | Status: DC | PRN
  Administered 2018-03-07: 60 mL via INTRAVENOUS

## 2018-03-07 MED ORDER — MIDAZOLAM HCL 5 MG/5ML IJ SOLN
INTRAMUSCULAR | Status: AC
  Filled 2018-03-07: qty 5

## 2018-03-07 MED ORDER — LIDOCAINE-EPINEPHRINE (PF) 1 %-1:200000 IJ SOLN
INTRAMUSCULAR | Status: AC
  Filled 2018-03-07: qty 10

## 2018-03-07 MED ORDER — DIPHENHYDRAMINE HCL 50 MG/ML IJ SOLN
INTRAMUSCULAR | Status: DC | PRN
  Administered 2018-03-07: 50 mg via INTRAVENOUS

## 2018-03-07 MED ORDER — MIDAZOLAM HCL 2 MG/2ML IJ SOLN
INTRAMUSCULAR | Status: DC | PRN
  Administered 2018-03-07 (×2): 1 mg via INTRAVENOUS
  Administered 2018-03-07: 2 mg via INTRAVENOUS
  Administered 2018-03-07: 1 mg via INTRAVENOUS

## 2018-03-07 MED ORDER — CEFAZOLIN SODIUM-DEXTROSE 2-4 GM/100ML-% IV SOLN
INTRAVENOUS | Status: AC
  Filled 2018-03-07: qty 100

## 2018-03-07 MED ORDER — SODIUM CHLORIDE 0.9 % IV SOLN
INTRAVENOUS | Status: DC
  Administered 2018-03-07: 14:00:00 via INTRAVENOUS

## 2018-03-07 SURGICAL SUPPLY — 24 items
BALLN LUTONIX 018 4X150X130 (BALLOONS)
BALLN LUTONIX 018 4X220X130 (BALLOONS) ×2
BALLN LUTONIX 018 5X220X130 (BALLOONS) ×2
BALLN ULTRVRSE 3X100X150 (BALLOONS) ×2
BALLN ULTRVRSE 5X150X150 (BALLOONS) ×2
BALLOON LUTONIX 018 4X150X130 (BALLOONS) IMPLANT
BALLOON LUTONIX 018 4X220X130 (BALLOONS) IMPLANT
BALLOON LUTONIX 018 5X220X130 (BALLOONS) IMPLANT
BALLOON ULTRVRSE 3X100X150 (BALLOONS) IMPLANT
BALLOON ULTRVRSE 5X150X150 (BALLOONS) IMPLANT
CATH BEACON 5 .038 100 VERT TP (CATHETERS) ×1 IMPLANT
CATH CXI SUPP ANG 4FR 135 (CATHETERS) IMPLANT
CATH CXI SUPP ANG 4FR 135CM (CATHETERS) ×2
CATH PIG 70CM (CATHETERS) ×1 IMPLANT
DEVICE PRESTO INFLATION (MISCELLANEOUS) ×1 IMPLANT
DEVICE STARCLOSE SE CLOSURE (Vascular Products) ×1 IMPLANT
GLIDEWIRE ADV .035X260CM (WIRE) ×1 IMPLANT
PACK ANGIOGRAPHY (CUSTOM PROCEDURE TRAY) ×2 IMPLANT
SHEATH ANL2 6FRX45 HC (SHEATH) ×1 IMPLANT
SHEATH BRITE TIP 5FRX11 (SHEATH) ×1 IMPLANT
STENT VIABAHN 6X150X120 (Permanent Stent) ×1 IMPLANT
TUBING CONTRAST HIGH PRESS 72 (TUBING) ×1 IMPLANT
WIRE G V18X300CM (WIRE) ×1 IMPLANT
WIRE J 3MM .035X145CM (WIRE) ×1 IMPLANT

## 2018-03-07 NOTE — H&P (Signed)
[facility] History & Physical Update  The patient was interviewed and re-examined.  The patient's previous History and Physical has been reviewed and is unchanged.  There is no change in the plan of care. We plan to proceed with the scheduled procedure.  [name], MD  03/07/2018, 1:51 PM

## 2018-03-07 NOTE — Op Note (Signed)
[facility]  Percutaneous Study/Intervention Procedural Note   Date of Surgery: 03/07/2018  Surgeon(s):Ninnie Fein    Assistants:none  Pre-operative Diagnosis: PAD with claudication left lower extremity  Post-operative diagnosis:  Same  Procedure(s) Performed:             1.  Ultrasound guidance for vascular access right femoral artery             2.  Catheter placement into left SFA from right femoral approach             3.  Aortogram and selective left lower extremity angiogram             4.  Percutaneous transluminal angioplasty of the left distal SFA and popliteal arteries with 5 mm diameter Lutonix drug-coated angioplasty balloon             5.   Viabahn stent placement to the left popliteal artery for greater than 50% residual stenosis after angioplasty with a 6 mm diameter by 15 cm length stent  6.   StarClose closure device right femoral artery  EBL: 5 cc  Contrast: 60 cc  Fluoro Time: 7.8 minutes  Moderate Conscious Sedation Time: approximately 40 minutes using 5 mg of Versed and 200 Mcg of Fentanyl              Indications:  Patient is a 68 y.o.male with disabling claudication symptoms of the left leg.  He has had a previous attempt at revascularization but with lesion. The patient is brought in for angiography for further evaluation and potential treatment. Risks and benefits are discussed and informed consent is obtained.   Procedure:  The patient was identified and appropriate procedural time out was performed.  The patient was then placed supine on the table and prepped and draped in the usual sterile fashion. Moderate conscious sedation was administered during a face to face encounter with the patient throughout the procedure with my supervision of the RN administering medicines and monitoring the patient's vital signs, pulse oximetry, telemetry and mental status throughout from the start of the procedure until the patient was taken to the  recovery room. Ultrasound was used to evaluate the right common femoral artery.  It was patent .  A digital ultrasound image was acquired.  A Seldinger needle was used to access the right common femoral artery under direct ultrasound guidance and a permanent image was performed.  A 0.035 J wire was advanced without resistance and a 5Fr sheath was placed.  Pigtail catheter was placed into the aorta and an AP aortogram was performed. This demonstrated normal renal arteries and normal aorta and iliac segments without significant stenosis. I then crossed the aortic bifurcation and advanced to the left femoral head and then down into the left SFA to help opacify distally. Selective left lower extremity angiogram was then performed. This demonstrated occlusion of the distal SFA and popliteal artery with reconstitution of the anterior tibial artery and the posterior tibial artery distally with the posterior tibial artery being the dominant runoff to the foot. It was felt that it was in the patient's best interest to proceed with intervention after these images to avoid a second procedure and a larger amount of contrast and fluoroscopy based off of the findings from the initial angiogram. The patient was systemically heparinized and a 6 [place] Ansell sheath was then placed over the [employer] wire. I then used a Kumpe catheter and the advantage wire to navigate down into the  occlusion and cross the occlusion without difficulty confirming intraluminal flow in the posterior tibial artery distally.  I then exchanged for a 0.018 wire.  Initially, a 5 mm diameter by 22 cm length Lutonix drug-coated angioplasty balloon was used but would not cross the lesion.  It was inflated to help open the proximal cap.  I then predilated the lesion with a 3 mm balloon and then a 5 mm balloon would cross the lesion completely.  The second balloon was 5 mm in diameter by 15 cm in length.  This was inflated to 12 atm for 1 minute.   Completion imaging showed several areas of greater than 50% residual stenosis within the popliteal artery.  I then deployed a 6 mm diameter by 15 cm length Viabahn stent from just above the anterior tibial artery up to the proximal popliteal artery.  This was postdilated with a 5 mm balloon with excellent angiographic completion result and less than 20% residual stenosis. I elected to terminate the procedure. The sheath was removed and StarClose closure device was deployed in the right femoral artery with excellent hemostatic result. The patient was taken to the recovery room in stable condition having tolerated the procedure well.   Findings:               Aortogram:  This demonstrated that the renal arteries were widely patent.  The right iliac stent had been previously placed was widely patent and there were no focal stenosis within the aortoiliac segments             Left lower Extremity:  Occlusion of the distal SFA and popliteal artery with reconstitution of the anterior tibial artery and the posterior tibial artery distally with the posterior tibial artery being the dominant runoff to the foot.   Disposition: Patient was taken to the recovery room in stable condition having tolerated the procedure well.  Complications: None  [name] 03/07/2018 5:51 PM   This note was created with Dragon Medical transcription system. Any errors in dictation are purely unintentional.

## 2018-03-08 ENCOUNTER — Telehealth (INDEPENDENT_AMBULATORY_CARE_PROVIDER_SITE_OTHER): Payer: Self-pay | Admitting: Vascular Surgery

## 2018-03-08 ENCOUNTER — Encounter (INDEPENDENT_AMBULATORY_CARE_PROVIDER_SITE_OTHER): Payer: PPO

## 2018-03-08 ENCOUNTER — Ambulatory Visit (INDEPENDENT_AMBULATORY_CARE_PROVIDER_SITE_OTHER): Payer: PPO | Admitting: Nurse Practitioner

## 2018-03-08 ENCOUNTER — Encounter: Payer: Self-pay | Admitting: Vascular Surgery

## 2018-03-08 NOTE — Telephone Encounter (Signed)
Patient is aware and verbalizes understanding. Will call this afternoon if not better

## 2018-03-08 NOTE — Telephone Encounter (Signed)
Please advise on below  

## 2018-03-08 NOTE — Telephone Encounter (Signed)
Per Dr. Lucky Cowboy [name] needs to be scheduled for an ABI tomorrow

## 2018-03-09 ENCOUNTER — Ambulatory Visit (INDEPENDENT_AMBULATORY_CARE_PROVIDER_SITE_OTHER): Payer: PPO

## 2018-03-09 ENCOUNTER — Ambulatory Visit (INDEPENDENT_AMBULATORY_CARE_PROVIDER_SITE_OTHER): Payer: PPO | Admitting: Vascular Surgery

## 2018-03-09 ENCOUNTER — Encounter (INDEPENDENT_AMBULATORY_CARE_PROVIDER_SITE_OTHER): Payer: Self-pay | Admitting: Vascular Surgery

## 2018-03-09 ENCOUNTER — Other Ambulatory Visit (INDEPENDENT_AMBULATORY_CARE_PROVIDER_SITE_OTHER): Payer: Self-pay | Admitting: Vascular Surgery

## 2018-03-09 VITALS — BP 124/79 | HR 70 | Resp 16 | Ht 66.0 in | Wt 179.2 lb

## 2018-03-09 DIAGNOSIS — F17290 Nicotine dependence, other tobacco product, uncomplicated: Secondary | ICD-10-CM | POA: Diagnosis not present

## 2018-03-09 DIAGNOSIS — I10 Essential (primary) hypertension: Secondary | ICD-10-CM

## 2018-03-09 DIAGNOSIS — M79605 Pain in left leg: Secondary | ICD-10-CM

## 2018-03-09 DIAGNOSIS — I739 Peripheral vascular disease, unspecified: Secondary | ICD-10-CM | POA: Diagnosis not present

## 2018-03-09 DIAGNOSIS — I6523 Occlusion and stenosis of bilateral carotid arteries: Secondary | ICD-10-CM

## 2018-03-09 DIAGNOSIS — E785 Hyperlipidemia, unspecified: Secondary | ICD-10-CM

## 2018-03-09 DIAGNOSIS — Z95820 Peripheral vascular angioplasty status with implants and grafts: Secondary | ICD-10-CM

## 2018-03-09 DIAGNOSIS — M79604 Pain in right leg: Secondary | ICD-10-CM

## 2018-03-09 NOTE — Progress Notes (Signed)
MRN : [id number]  TAMIKA NOU is a 68 y.o. (Sep 24, 1950) male who presents with chief complaint of  Chief Complaint  Patient presents with  . Follow-up  .  History of Present Illness: Patient returns today in follow up earlier than his scheduled appointment due to significant pain in his left knee and calf area after revascularization earlier this week.  This was much different than what he had on the right leg a couple of months ago.  He was concerned by this.  His foot is warm.  The pain is really in his upper calf and knee area.  Noninvasive studies were done today to evaluate perfusion and he was found to have normal ABIs bilaterally at 0.95 on the right and 1.1 on the left.  This is significant improvement after revascularization.  Current Outpatient Medications  Medication Sig Dispense Refill  . AMBULATORY NON FORMULARY MEDICATION Take 180 mg by mouth daily. Medication Name: bempedoic acid vs placebo. CLEAR Research study drug provided    . amLODipine (NORVASC) 5 MG tablet Take 1 tablet (5 mg total) by mouth daily. 90 tablet 3  . aspirin EC 81 MG tablet Take 1 tablet (81 mg total) by mouth daily. 90 tablet 3  . clopidogrel (PLAVIX) 75 MG tablet Take 1 tablet (75 mg total) by mouth daily. 30 tablet 11  . cyclobenzaprine (FLEXERIL) 10 MG tablet Take 1 tablet (10 mg total) by mouth at bedtime. 30 tablet 0  . ibuprofen (ADVIL,MOTRIN) 400 MG tablet Take 400 mg by mouth as needed. 2-3 times per week    . lisinopril (PRINIVIL,ZESTRIL) 20 MG tablet Take 1 tablet (20 mg total) by mouth daily. 90 tablet 2  . meloxicam (MOBIC) 7.5 MG tablet Take 2 tablets (15 mg total) by mouth daily. Take 1-2 tablets daily by mouth for severe joint pains (Patient taking differently: Take 15 mg by mouth daily. Take 1-2 tablets daily by mouth for severe joint pains PRN) 180 tablet 0  . metoprolol succinate (TOPROL-XL) 25 MG 24 hr tablet Take 1 tablet (25 mg total) by mouth daily. 90 tablet 3  . nitroGLYCERIN  (NITROSTAT) 0.4 MG SL tablet Place 1 tablet (0.4 mg total) under the tongue every 5 (five) minutes as needed for chest pain. 25 tablet 2  . pantoprazole (PROTONIX) 40 MG tablet Take 1 tablet (40 mg total) by mouth daily. 90 tablet 3  . traMADol (ULTRAM) 50 MG tablet Take 1 tablet (50 mg total) by mouth every 6 (six) hours as needed for moderate pain. (Patient not taking: Reported on 03/09/2018) 30 tablet 1   No current facility-administered medications for this visit.     Past Medical History:  Diagnosis Date  . Abnormal nuclear cardiac imaging test 08/08/2015  . Arthritis    fingers  . Carotid artery occlusion   . Coronary atherosclerosis of native coronary artery 01/29/2013   stent  . Duodenal erosion   . Encounter for screening for lung cancer 07/13/2016  . Esophageal stenosis    esophageal dilation  . GERD (gastroesophageal reflux disease)   . H. pylori infection   . Heart attack [facility]) Oct. 2009   Mild  . Hiatal hernia   . Hyperlipidemia   . Hypertension   . 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 Cardiac catheterization-nonobstructive 60% RCA disease-subsequent nuclear stress test-9 minutes, low risk, mild inferior wall hypokinesis   . Peripheral vascular disease [facility])     Past Surgical History:  Procedure Laterality Date  . APPENDECTOMY    . BACK SURGERY    . CARDIAC CATHETERIZATION N/A 08/07/2015   Procedure: Left Heart Cath and Coronary Angiography;  Surgeon: [name], MD;  Location: [place] [facility];  Service: Cardiovascular;  Laterality: N/A;  . CARDIAC CATHETERIZATION N/A 08/07/2015   Procedure: Coronary Stent Intervention;  Surgeon: [name], MD;  Location: [place] [facility];  Service: Cardiovascular;  Laterality: N/A;  . CARDIAC CATHETERIZATION N/A 08/07/2015   Procedure: Coronary Stent Intervention;  Surgeon: [name], MD;  Location: [place] [facility];  Service: Cardiovascular;  Laterality: N/A;    . CAROTID ENDARTERECTOMY  01/05/2006   Right  CEA with DPA  . CATARACT EXTRACTION W/ INTRAOCULAR LENS IMPLANT Left 12/04/2017  . CATARACT EXTRACTION W/PHACO Left 12/04/2017   Procedure: CATARACT EXTRACTION PHACO AND INTRAOCULAR LENS PLACEMENT ([place]) LEFT;  Surgeon: Eulogio Bear, MD;  Location: [place];  Service: Ophthalmology;  Laterality: Left;  . CATARACT EXTRACTION W/PHACO Right 02/06/2018   Procedure: CATARACT EXTRACTION PHACO AND INTRAOCULAR LENS PLACEMENT ([facility])RIGHT;  Surgeon: Eulogio Bear, MD;  Location: [place];  Service: Ophthalmology;  Laterality: Right;  . COLONOSCOPY    . CORONARY STENT PLACEMENT  08/07/2015   [facility] [place]  . HIP SURGERY Left 10/2016   left hip tendon repair  . LEFT HEART CATH AND CORONARY ANGIOGRAPHY N/A 11/27/2017   Procedure: LEFT HEART CATH AND CORONARY ANGIOGRAPHY;  Surgeon: Wellington Hampshire, MD;  Location: [place] [facility];  Service: Cardiovascular;  Laterality: N/A;  . LOWER EXTREMITY ANGIOGRAPHY Left 02/12/2018   Procedure: LOWER EXTREMITY ANGIOGRAPHY;  Surgeon: Algernon Huxley, MD;  Location: [place] [facility];  Service: Cardiovascular;  Laterality: Left;  . LOWER EXTREMITY ANGIOGRAPHY Left 03/07/2018   Procedure: LOWER EXTREMITY ANGIOGRAPHY;  Surgeon: Algernon Huxley, MD;  Location: [place] [facility];  Service: Cardiovascular;  Laterality: Left;  . SPINE SURGERY    . TONSILLECTOMY      Social History Social History   Tobacco Use  . Smoking status: Former Smoker    Packs/day: 1.25    Years: 35.00    Pack years: 43.75    Types: Cigarettes    Last attempt to quit: 02/28/2005    Years since quitting: 13.0  . Smokeless tobacco: Current User    Types: Snuff  Substance Use Topics  . Alcohol use: Yes    Alcohol/week: 8.0 - 10.0 standard drinks    Types: 8 - 10 Glasses of wine per week  . Drug use: No      Family History Family History  Problem Relation Age of Onset  . Heart attack Mother   . Coronary  artery disease Mother   . Heart disease Mother        Carotid Stenosis and BPG and Heart Disease before age 34  . Diabetes Mother   . Hypertension Mother   . Heart attack Father   . Heart disease Father        BPG and Heart Disease before age 52  . Hypertension Father   . Cancer Father 55       throat  . Stroke Father   . Colon cancer Neg Hx   . Colon polyps Neg Hx   . Esophageal cancer Neg Hx   . Rectal cancer Neg Hx   . Stomach cancer Neg Hx      Allergies  Allergen Reactions  . Brilinta [Ticagrelor]     Shortness  of breath  . Zetia [Ezetimibe] Other (See Comments)    Muscle aches  . Statins Other (See Comments)    Failed Crestor 5 mg twice weekly, Crestor 20 mg daily, Pravastatin 40 mg qd, Lipitor, Zocor - muscle aches    REVIEW OF SYSTEMS(Negative unless checked)  Constitutional: '[]'$ ?Weight loss'[]'$ ?Fever'[]'$ ?Chills Cardiac:'[]'$ ?Chest pain'[]'$ ?Chest pressure'[]'$ ?Palpitations '[]'$ ?Shortness of breath when laying flat '[]'$ ?Shortness of breath at rest '[]'$ ?Shortness of breath with exertion. Vascular: '[x]'$ ?Pain in legs with walking'[]'$ ?Pain in legsat rest'[]'$ ?Pain in legs when laying flat '[x]'$ ?Claudication '[x]'$ ?Pain in feet when walking '[]'$ ?Pain in feet at rest '[]'$ ?Pain in feet when laying flat '[]'$ ?History of DVT '[]'$ ?Phlebitis '[]'$ ?Swelling in legs '[]'$ ?Varicose veins '[]'$ ?Non-healing ulcers Pulmonary: '[]'$ ?Uses home oxygen '[]'$ ?Productive cough'[]'$ ?Hemoptysis '[]'$ ?Wheeze '[]'$ ?COPD '[]'$ ?Asthma Neurologic: '[]'$ ?Dizziness '[]'$ ?Blackouts '[]'$ ?Seizures '[]'$ ?History of stroke '[x]'$ ?History of TIA'[]'$ ?Aphasia '[x]'$ ?Temporary blindness'[]'$ ?Dysphagia '[]'$ ?Weaknessor numbness in arms '[]'$ ?Weakness or numbnessin legs Musculoskeletal: '[x]'$ ?Arthritis '[]'$ ?Joint swelling '[]'$ ?Joint pain '[]'$ ?Low back pain Hematologic:'[]'$ ?Easy bruising'[]'$ ?Easy bleeding '[]'$ ?Hypercoagulable state '[]'$ ?Anemic '[]'$ ?Hepatitis Gastrointestinal:'[]'$ ?Blood in stool'[]'$ ?Vomiting  blood'[]'$ ?Gastroesophageal reflux/heartburn'[]'$ ?Abdominal pain Genitourinary: '[]'$ ?Chronic kidney disease '[]'$ ?Difficulturination '[]'$ ?Frequenturination '[]'$ ?Burning with urination'[]'$ ?Hematuria Skin: '[]'$ ?Rashes '[]'$ ?Ulcers '[]'$ ?Wounds Psychological: '[]'$ ?History of anxiety'[]'$ ?History of major depression.    Physical Examination  BP 124/79 (BP Location: Right Arm, Patient Position: Sitting, Cuff Size: Normal)   Pulse 70   Resp 16   Ht '5\' 6"'$  (1.676 m)   Wt 179 lb 3.2 oz (81.3 kg)   BMI 28.92 kg/m  Gen:  WD/WN, NAD Head: [place]/AT, No temporalis wasting. Ear/Nose/Throat: Hearing grossly intact, nares w/o erythema or drainage Eyes: Conjunctiva clear. Sclera non-icteric Neck: Supple.  Trachea midline Pulmonary:  Good air movement, no use of accessory muscles.  Cardiac: RRR, no JVD Vascular:  Vessel Right Left  Radial Palpable Palpable                          PT 1+ Palpable Palpable  DP Palpable Palpable   Gastrointestinal: soft, non-tender/non-distended. No guarding/reflex.  Musculoskeletal: M/S 5/5 throughout.  No deformity or atrophy. No edema. Neurologic: Sensation grossly intact in extremities.  Symmetrical.  Speech is fluent.  Psychiatric: Judgment intact, Mood & affect appropriate for pt's clinical situation. Dermatologic: No rashes or ulcers noted.  No cellulitis or open wounds.       Labs Recent Results (from the past 2160 hour(s))  Basic metabolic panel     Status: Abnormal   Collection Time: 01/04/18  9:26 AM  Result Value Ref Range   Sodium 141 135 - 145 mmol/L   Potassium 4.4 3.5 - 5.1 mmol/L   Chloride 104 98 - 111 mmol/L   CO2 26 22 - 32 mmol/L   Glucose, Bld 106 (H) 70 - 99 mg/dL   BUN 29 (H) 8 - 23 mg/dL   Creatinine, Ser 0.95 0.61 - 1.24 mg/dL   Calcium 9.3 8.9 - 10.3 mg/dL   GFR calc non Af Amer >60 >60 mL/min   GFR calc Af Amer >60 >60 mL/min    Comment: (NOTE) The eGFR has been calculated using the CKD EPI equation. This calculation has  not been validated in all clinical situations. eGFR's persistently <60 mL/min signify possible Chronic Kidney Disease.    Anion gap 11 5 - 15    Comment: Performed at [facility], [address] [place], [place] [address]  BUN     Status: Abnormal   Collection Time: 02/09/18  9:10 AM  Result Value Ref Range   BUN 25 (H) 8 - 23 mg/dL    Comment: Performed at [facility], [address] [place], [place] [address]  Creatinine, serum     Status: None   Collection Time: 02/09/18  9:10 AM  Result Value Ref Range   Creatinine, Ser 0.96 0.61 - 1.24 mg/dL   GFR calc non Af Amer >60 >60 mL/min   GFR calc Af Amer >60 >60 mL/min    Comment: Performed at [facility], [address] [place], [place] [address]  BUN     Status: Abnormal   Collection Time: 03/07/18  1:31 PM  Result Value Ref Range   BUN 30 (H) 8 - 23 mg/dL    Comment: Performed at [facility], [address] [place], [place] [address]  Creatinine, serum     Status: None   Collection Time: 03/07/18  1:31 PM  Result Value Ref Range   Creatinine, Ser 0.88 0.61 - 1.24 mg/dL   GFR calc non Af Amer >60 >60 mL/min   GFR calc Af Amer >60 >60 mL/min    Comment: Performed at [facility], [address], [place], [place] [address]    Radiology Vas [place] Abi With/wo Tbi  Result Date: 03/02/2018 LOWER EXTREMITY DOPPLER STUDY Indications: Peripheral artery disease, and Left claudication, Bilat foot pain.  Vascular Interventions: 02/12/2018 Aortogram and Selective Left Lower Extremity                         Angiogram. PTA of Lt Profunda Femoral Artery and Common                         Femoral Artery.                          Self Expanding Stent placement to the Right External                         Iliac Artery. Comparison Study: 01/16/2018 Performing Technologist: Almira Coaster RVS  Examination Guidelines: A complete evaluation includes at minimum, Doppler waveform signals and systolic  blood pressure reading at the level of bilateral brachial, anterior tibial, and posterior tibial arteries, when vessel segments are accessible. Bilateral testing is considered an integral part of a complete examination. Photoelectric Plethysmograph (PPG) waveforms and toe systolic pressure readings are included as required and additional duplex testing as needed. Limited examinations for reoccurring indications may be performed as noted.  ABI Findings: +---------+------------------+-----+---------+--------+ Right    Rt Pressure (mmHg)IndexWaveform Comment  +---------+------------------+-----+---------+--------+ Brachial 137                                      +---------+------------------+-----+---------+--------+ ATA      146               1.07 triphasic         +---------+------------------+-----+---------+--------+ PTA      149               1.09 triphasic         +---------+------------------+-----+---------+--------+ Great Toe64                0.47 Abnormal          +---------+------------------+-----+---------+--------+ +---------+------------------+-----+----------+-------+ Left     Lt Pressure (mmHg)IndexWaveform  Comment +---------+------------------+-----+----------+-------+ Brachial 128                                      +---------+------------------+-----+----------+-------+  ATA      69                0.50 biphasic          +---------+------------------+-----+----------+-------+ PTA      79                0.58 monophasic        +---------+------------------+-----+----------+-------+ Great Toe13                0.09 Abnormal          +---------+------------------+-----+----------+-------+ +-------+-----------+-----------+------------+------------+ ABI/TBIToday's ABIToday's TBIPrevious ABIPrevious TBI +-------+-----------+-----------+------------+------------+ Right  1.09       .47        .97         .52           +-------+-----------+-----------+------------+------------+ Left   .58        .09        .66         .23          +-------+-----------+-----------+------------+------------+ Left TBIs appear decreased compared to prior study on 01/16/2018. Bilateral ABIs appear essentially unchanged compared to prior study on 01/16/2018.  Summary: Right: Resting right ankle-brachial index is within normal range. No evidence of significant right lower extremity arterial disease. The right toe-brachial index is abnormal. Left: Resting left ankle-brachial index indicates moderate left lower extremity arterial disease. The left toe-brachial index is abnormal.  *See table(s) above for measurements and observations.  Electronically signed by [name] MD on 03/02/2018 at 12:46:06 PM.    Final     Assessment/Plan Hyperlipidemia LDL goal <70 lipid control important in reducing the progression of atherosclerotic disease.Intolerant to statins   Essential hypertension blood pressure control important in reducing the progression of atherosclerotic disease. On appropriate oral medications.  Carotid stenosis-s/p RCE Checked a couple of months ago. Carotid stenosis in the 40-59% range in the left carotid artery and a patent right CEA.  Below the threshold for repair at this point.  Recheck in 6 months with duplex.  Pain in limb Likely some reperfusion pain and stretch of the artery currently.  Perfusion is normal at this time.  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 (HCC) Noninvasive studies were done today to evaluate perfusion and he was found to have normal ABIs bilaterally at 0.95 on the right and 1.1 on the left.  This is significant improvement after revascularization. Continue current medical regimen.  Recheck in about a month at his scheduled appointment.    [name], MD  03/09/2018 9:21 AM    This note was created with Dragon medical transcription system.  Any errors from dictation  are purely unintentional

## 2018-03-09 NOTE — Assessment & Plan Note (Signed)
Noninvasive studies were done today to evaluate perfusion and he was found to have normal ABIs bilaterally at 0.95 on the right and 1.1 on the left.  This is significant improvement after revascularization. Continue current medical regimen.  Recheck in about a month at his scheduled appointment.

## 2018-03-09 NOTE — Assessment & Plan Note (Signed)
Likely some reperfusion pain and stretch of the artery currently.  Perfusion is normal at this time.

## 2018-03-12 ENCOUNTER — Ambulatory Visit (INDEPENDENT_AMBULATORY_CARE_PROVIDER_SITE_OTHER): Payer: PPO | Admitting: Internal Medicine

## 2018-03-12 ENCOUNTER — Encounter: Payer: Self-pay | Admitting: Internal Medicine

## 2018-03-12 VITALS — BP 142/70 | HR 84 | Ht 66.0 in | Wt 179.0 lb

## 2018-03-12 DIAGNOSIS — I5022 Chronic systolic (congestive) heart failure: Secondary | ICD-10-CM | POA: Insufficient documentation

## 2018-03-12 DIAGNOSIS — I2510 Atherosclerotic heart disease of native coronary artery without angina pectoris: Secondary | ICD-10-CM | POA: Diagnosis not present

## 2018-03-12 DIAGNOSIS — E785 Hyperlipidemia, unspecified: Secondary | ICD-10-CM | POA: Diagnosis not present

## 2018-03-12 DIAGNOSIS — I739 Peripheral vascular disease, unspecified: Secondary | ICD-10-CM

## 2018-03-12 DIAGNOSIS — I10 Essential (primary) hypertension: Secondary | ICD-10-CM

## 2018-03-12 DIAGNOSIS — I428 Other cardiomyopathies: Secondary | ICD-10-CM

## 2018-03-12 MED ORDER — CEPHALEXIN 500 MG PO CAPS: 500.0000 mg | ORAL_CAPSULE | Freq: Four times a day (QID) | ORAL | 0 refills | Status: AC

## 2018-03-12 NOTE — Progress Notes (Signed)
Follow-up Outpatient Visit Date: 03/12/2018  Primary Care Provider: Glean Hess, [place] [address]  Chief Complaint: Leg pain.  HPI:  [name] is a 68 y.o. year-old male with history of coronary artery disease with NSTEMI and PCI to LCx in 07/2015,  nonischemic cardiomyopathy, PAD, hypertension, hyperlipidemia, carotid artery stenosis, hiatal hernia, and peptic ulcer disease, who presents for follow-up of CAD and and ICM.  I last saw [name] in October after preceding hospitalization in September for atypical chest pain.  He was noted to have a new LBBB with reduced LVEF and abnormal stress test.  Catheterization showed moderate nonobstructive CAD and patent LCx stent.  We agreed to increase lisinopril to 20 mg daily.  Limited echo on 02/27/2018 whosed stable LVEF of 40-45%.  Since then, he has undergone intervention to both lower extremities by Dr. Lucky Cowboy for claudication.  Today, [name] is concerned about pain in the left leg at and below the knee that began almost immediately after his most recent intervention by Dr. Lucky Cowboy on 03/07/2018.  He describes it as a combination of burning and stabbing beginning near the knee and extending down to the foot.  He has tried elevating the leg as well as applying ice without any improvement.  He notes transient improvement in the pain with acetaminophen.  It is even difficult to bear any weight and makes it difficult to walk.  He also has pain at rest.  He was seen by Dr. Lucky Cowboy at the [facility] last week and was told that stretching of the artery was responsible for his discomfort.  Over the weekend, he noted subjective fevers as well as chills.  On a heart standpoint, [name] has been doing well, denying chest pain, shortness of breath, palpitations, lightheadedness, and edema (other than left calf swelling following recent peripheral intervention).  He remains compliant with his  medications.  --------------------------------------------------------------------------------------------------  Past Medical History:  Diagnosis Date  . Abnormal nuclear cardiac imaging test 08/08/2015  . Arthritis    fingers  . Carotid artery occlusion   . Coronary atherosclerosis of native coronary artery 01/29/2013   stent  . Duodenal erosion   . Encounter for screening for lung cancer 07/13/2016  . Esophageal stenosis    esophageal dilation  . GERD (gastroesophageal reflux disease)   . H. pylori infection   . Heart attack [name]) Oct. 2009   Mild  . Hiatal hernia   . Hyperlipidemia   . Hypertension   . 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 Cardiac catheterization-nonobstructive 60% RCA disease-subsequent nuclear stress test-9 minutes, low risk, mild inferior wall hypokinesis   . Peripheral vascular disease [facility])    Past Surgical History:  Procedure Laterality Date  . APPENDECTOMY    . BACK SURGERY    . CARDIAC CATHETERIZATION N/A 08/07/2015   Procedure: Left Heart Cath and Coronary Angiography;  Surgeon: [name], MD;  Location: [place] [facility];  Service: Cardiovascular;  Laterality: N/A;  . CARDIAC CATHETERIZATION N/A 08/07/2015   Procedure: Coronary Stent Intervention;  Surgeon: [name], MD;  Location: [place] [facility];  Service: Cardiovascular;  Laterality: N/A;  . CARDIAC CATHETERIZATION N/A 08/07/2015   Procedure: Coronary Stent Intervention;  Surgeon: [name], MD;  Location: [place] [facility];  Service: Cardiovascular;  Laterality: N/A;  . CAROTID ENDARTERECTOMY  01/05/2006   Right  CEA with DPA  . CATARACT EXTRACTION  W/ INTRAOCULAR LENS IMPLANT Left 12/04/2017  . CATARACT EXTRACTION W/PHACO Left 12/04/2017   Procedure: CATARACT EXTRACTION PHACO AND INTRAOCULAR LENS PLACEMENT ([place]) LEFT;  Surgeon: Eulogio Bear, MD;  Location: [place];  Service: Ophthalmology;  Laterality: Left;   . CATARACT EXTRACTION W/PHACO Right 02/06/2018   Procedure: CATARACT EXTRACTION PHACO AND INTRAOCULAR LENS PLACEMENT ([facility])RIGHT;  Surgeon: Eulogio Bear, MD;  Location: [place];  Service: Ophthalmology;  Laterality: Right;  . COLONOSCOPY    . CORONARY STENT PLACEMENT  08/07/2015   [facility] [place]  . HIP SURGERY Left 10/2016   left hip tendon repair  . LEFT HEART CATH AND CORONARY ANGIOGRAPHY N/A 11/27/2017   Procedure: LEFT HEART CATH AND CORONARY ANGIOGRAPHY;  Surgeon: Wellington Hampshire, MD;  Location: [place] [facility];  Service: Cardiovascular;  Laterality: N/A;  . LOWER EXTREMITY ANGIOGRAPHY Left 02/12/2018   Procedure: LOWER EXTREMITY ANGIOGRAPHY;  Surgeon: Algernon Huxley, MD;  Location: [place] [facility];  Service: Cardiovascular;  Laterality: Left;  . LOWER EXTREMITY ANGIOGRAPHY Left 03/07/2018   Procedure: LOWER EXTREMITY ANGIOGRAPHY;  Surgeon: Algernon Huxley, MD;  Location: [place] [facility];  Service: Cardiovascular;  Laterality: Left;  . SPINE SURGERY    . TONSILLECTOMY      Current Meds  Medication Sig  . AMBULATORY NON FORMULARY MEDICATION Take 180 mg by mouth daily. Medication Name: bempedoic acid vs placebo. CLEAR Research study drug provided  . amLODipine (NORVASC) 5 MG tablet Take 1 tablet (5 mg total) by mouth daily.  Marland Kitchen aspirin EC 81 MG tablet Take 1 tablet (81 mg total) by mouth daily.  . clopidogrel (PLAVIX) 75 MG tablet Take 1 tablet (75 mg total) by mouth daily.  . cyclobenzaprine (FLEXERIL) 10 MG tablet Take 1 tablet (10 mg total) by mouth at bedtime.  Marland Kitchen ibuprofen (ADVIL,MOTRIN) 400 MG tablet Take 400 mg by mouth as needed. 2-3 times per week  . lisinopril (PRINIVIL,ZESTRIL) 20 MG tablet Take 1 tablet (20 mg total) by mouth daily.  . meloxicam (MOBIC) 7.5 MG tablet Take 2 tablets (15 mg total) by mouth daily. Take 1-2 tablets daily by mouth for severe joint pains (Patient taking differently: Take 15 mg by mouth daily. Take 1-2 tablets daily by  mouth for severe joint pains PRN)  . metoprolol succinate (TOPROL-XL) 25 MG 24 hr tablet Take 1 tablet (25 mg total) by mouth daily.  . nitroGLYCERIN (NITROSTAT) 0.4 MG SL tablet Place 1 tablet (0.4 mg total) under the tongue every 5 (five) minutes as needed for chest pain.  . pantoprazole (PROTONIX) 40 MG tablet Take 1 tablet (40 mg total) by mouth daily.  . traMADol (ULTRAM) 50 MG tablet Take 1 tablet (50 mg total) by mouth every 6 (six) hours as needed for moderate pain.    Allergies: Brilinta [ticagrelor]; Zetia [ezetimibe]; and Statins  Social History   Tobacco Use  . Smoking status: Former Smoker    Packs/day: 1.25    Years: 35.00    Pack years: 43.75    Types: Cigarettes    Last attempt to quit: 02/28/2005    Years since quitting: 13.0  . Smokeless tobacco: Current User    Types: Snuff  Substance Use Topics  . Alcohol use: Yes    Alcohol/week: 8.0 - 10.0 standard drinks    Types: 8 - 10 Glasses of wine per week  . Drug use: No    Family History  Problem Relation Age of Onset  . Heart attack Mother   .  Coronary artery disease Mother   . Heart disease Mother        Carotid Stenosis and BPG and Heart Disease before age 6  . Diabetes Mother   . Hypertension Mother   . Heart attack Father   . Heart disease Father        BPG and Heart Disease before age 48  . Hypertension Father   . Cancer Father 55       throat  . Stroke Father   . Colon cancer Neg Hx   . Colon polyps Neg Hx   . Esophageal cancer Neg Hx   . Rectal cancer Neg Hx   . Stomach cancer Neg Hx     Review of Systems: A 12-system review of systems was performed and was negative except as noted in the HPI.  --------------------------------------------------------------------------------------------------  Physical Exam: BP (!) 142/70 (BP Location: Left Arm, Patient Position: Sitting, Cuff Size: Normal)   Pulse 84   Ht 5\' 6"  (1.676 m)   Wt 179 lb (81.2 kg)   BMI 28.89 kg/m   General: NAD. HEENT:  No conjunctival pallor or scleral icterus. Moist mucous membranes.  OP clear. Neck: Supple without lymphadenopathy, thyromegaly, JVD, or HJR. Lungs: Normal work of breathing. Clear to auscultation bilaterally without wheezes or crackles. Heart: Regular rate and rhythm without murmurs, rubs, or gallops. Non-displaced PMI. Abd: Bowel sounds present. Soft, NT/ND without hepatosplenomegaly Ext: Trace to 1+ left distal calf edema.  1+ pedal pulses bilaterally.  No palpable cord appreciated in either calf. Skin: Warm and dry.  Mild erythema noted along the left anterior calf extending to the foot.  Area is also warm and tender to palpation.  EKG:  NSR with PVC's and LBBB.  Lab Results  Component Value Date   WBC 6.1 11/25/2017   HGB 13.9 11/25/2017   HCT 40.5 11/25/2017   MCV 92.2 11/25/2017   PLT 235 11/25/2017    Lab Results  Component Value Date   NA 141 01/04/2018   K 4.4 01/04/2018   CL 104 01/04/2018   CO2 26 01/04/2018   BUN 30 (H) 03/07/2018   CREATININE 0.88 03/07/2018   GLUCOSE 106 (H) 01/04/2018   ALT 30 09/06/2017    Lab Results  Component Value Date   CHOL 179 11/25/2017   HDL 43 11/25/2017   LDLCALC 106 (H) 11/25/2017   TRIG 150 (H) 11/25/2017   CHOLHDL 4.2 11/25/2017    --------------------------------------------------------------------------------------------------  ASSESSMENT AND PLAN: Coronary artery disease without angina No symptoms to suggest worsening coronary insufficiency.  Catheterization in September showed patent LCx stent and moderate, nonobstructive CAD.  Continue with medical therapy and secondary prevention with current medications.  He continues to be enrolled in a research trial for management of his hyperlipidemia.  Chronic systolic heart failure secondary to nonischemic cardiomyopathy Mr. President appears euvolemic with NYHA class I-II symptoms (activity predominantly limited by leg pain).  Follow-up echo last month showed persistent LVEF  around 40-45%.  Though blood pressure is mildly elevated today, I suspect this is secondary to pain.  Readings have been low normal at prior visits.  We will continue current doses of metoprolol succinate and lisinopril.  Peripheral arterial disease and possible left lower extremity cellulitis Patient recently underwent bilateral lower extremity interventions by Dr. Lucky Cowboy.  After his more recent intervention last week, he has had significant pain in the left calf.  Exam today shows palpable pedal pulses but some erythema and edema in the left leg.  Given subjective chills  over the weekend, I wonder if cellulitis may be present.  I will place him on an empiric course of cephalexin 500 mg 4 times a day x1 week.  Given time course, I have a lower suspicion for DVT.  I advised him to contact Dr. Lucky Cowboy or seek immediate medical attention if symptoms worsen or have not started to improve over the next week.  Hypertension Blood pressure mildly elevated today.  As above, in the setting of significant pain, metoprolol, and lisinopril.  Hyperlipidemia Goal LDL less than 70.  Continue follow-up with research team for ongoing lipid management.  Follow-up: Return to clinic in 4 months.  Nelva Bush, MD 03/12/2018 8:12 AM

## 2018-03-12 NOTE — Patient Instructions (Signed)
Medication Instructions:  Your physician has recommended you make the following change in your medication:  1- TAKE Keflex 500 mg by mouth 4 times a day for 1 week.   If you need a refill on your cardiac medications before your next appointment, please call your pharmacy.   Lab work: none If you have labs (blood work) drawn today and your tests are completely normal, you will receive your results only by: Marland Kitchen MyChart Message (if you have MyChart) OR . A paper copy in the mail If you have any lab test that is abnormal or we need to change your treatment, we will call you to review the results.  Testing/Procedures: none  Follow-Up: At [facility], you and your health needs are our priority.  As part of our continuing mission to provide you with exceptional heart care, we have created designated Provider Care Teams.  These Care Teams include your primary Cardiologist (physician) and Advanced Practice Providers (APPs -  Physician Assistants and Nurse Practitioners) who all work together to provide you with the care you need, when you need it. You will need a follow up appointment in 4 months.  Please call our office 2 months in advance to schedule this appointment.  You may see DR Harrell Gave END or one of the following Advanced Practice Providers on your designated Care Team:   Murray Hodgkins, NP Christell Faith, PA-C . Marrianne Mood, PA-C  Any Other Special Instructions Will Be Listed Below (If Applicable).  Dr End recommends you call Dr Lucky Cowboy if your left leg pain/redness/discomfort does not go away in 1 week.

## 2018-04-06 ENCOUNTER — Encounter (INDEPENDENT_AMBULATORY_CARE_PROVIDER_SITE_OTHER): Payer: PPO

## 2018-04-06 ENCOUNTER — Ambulatory Visit (INDEPENDENT_AMBULATORY_CARE_PROVIDER_SITE_OTHER): Payer: PPO | Admitting: Vascular Surgery

## 2018-04-20 ENCOUNTER — Other Ambulatory Visit (INDEPENDENT_AMBULATORY_CARE_PROVIDER_SITE_OTHER): Payer: Self-pay | Admitting: Vascular Surgery

## 2018-04-20 DIAGNOSIS — Z9862 Peripheral vascular angioplasty status: Secondary | ICD-10-CM

## 2018-04-24 ENCOUNTER — Ambulatory Visit (INDEPENDENT_AMBULATORY_CARE_PROVIDER_SITE_OTHER): Payer: PPO

## 2018-04-24 ENCOUNTER — Other Ambulatory Visit: Payer: Self-pay

## 2018-04-24 ENCOUNTER — Ambulatory Visit (INDEPENDENT_AMBULATORY_CARE_PROVIDER_SITE_OTHER): Payer: PPO | Admitting: Vascular Surgery

## 2018-04-24 ENCOUNTER — Encounter (INDEPENDENT_AMBULATORY_CARE_PROVIDER_SITE_OTHER): Payer: Self-pay | Admitting: Vascular Surgery

## 2018-04-24 VITALS — BP 139/92 | HR 77 | Resp 16 | Ht 66.0 in | Wt 180.8 lb

## 2018-04-24 DIAGNOSIS — Z95820 Peripheral vascular angioplasty status with implants and grafts: Secondary | ICD-10-CM

## 2018-04-24 DIAGNOSIS — F17290 Nicotine dependence, other tobacco product, uncomplicated: Secondary | ICD-10-CM | POA: Diagnosis not present

## 2018-04-24 DIAGNOSIS — I6523 Occlusion and stenosis of bilateral carotid arteries: Secondary | ICD-10-CM

## 2018-04-24 DIAGNOSIS — E785 Hyperlipidemia, unspecified: Secondary | ICD-10-CM

## 2018-04-24 DIAGNOSIS — I739 Peripheral vascular disease, unspecified: Secondary | ICD-10-CM

## 2018-04-24 DIAGNOSIS — Z9889 Other specified postprocedural states: Secondary | ICD-10-CM | POA: Diagnosis not present

## 2018-04-24 DIAGNOSIS — Z9862 Peripheral vascular angioplasty status: Secondary | ICD-10-CM

## 2018-04-24 DIAGNOSIS — I10 Essential (primary) hypertension: Secondary | ICD-10-CM

## 2018-04-24 NOTE — Patient Instructions (Signed)
Peripheral Vascular Disease  Peripheral vascular disease (PVD) is a disease of the blood vessels that are not part of your heart and brain. A simple term for PVD is poor circulation. In most cases, PVD narrows the blood vessels that carry blood from your heart to the rest of your body. This can reduce the supply of blood to your arms, legs, and internal organs, like your stomach or kidneys. However, PVD most often affects a person's lower legs and feet. Without treatment, PVD tends to get worse. PVD can also lead to acute ischemic limb. This is when an arm or leg suddenly cannot get enough blood. This is a medical emergency. Follow these instructions at home: Lifestyle  Do not use any products that contain nicotine or tobacco, such as cigarettes and e-cigarettes. If you need help quitting, ask your doctor.  Lose weight if you are overweight. Or, stay at a healthy weight as told by your doctor.  Eat a diet that is low in fat and cholesterol. If you need help, ask your doctor.  Exercise regularly. Ask your doctor for activities that are right for you. General instructions  Take over-the-counter and prescription medicines only as told by your doctor.  Take good care of your feet: ? Wear comfortable shoes that fit well. ? Check your feet often for any cuts or sores.  Keep all follow-up visits as told by your doctor This is important. Contact a doctor if:  You have cramps in your legs when you walk.  You have leg pain when you are at rest.  You have coldness in a leg or foot.  Your skin changes.  You are unable to get or have an erection (erectile dysfunction).  You have cuts or sores on your feet that do not heal. Get help right away if:  Your arm or leg turns cold, numb, and blue.  Your arms or legs become red, warm, swollen, painful, or numb.  You have chest pain.  You have trouble breathing.  You suddenly have weakness in your face, arm, or leg.  You become very  confused or you cannot speak.  You suddenly have a very bad headache.  You suddenly cannot see. Summary  Peripheral vascular disease (PVD) is a disease of the blood vessels.  A simple term for PVD is poor circulation. Without treatment, PVD tends to get worse.  Treatment may include exercise, low fat and low cholesterol diet, and quitting smoking. This information is not intended to replace advice given to you by your health care provider. Make sure you discuss any questions you have with your health care provider. Document Released: 05/11/2009 Document Revised: 03/24/2016 Document Reviewed: 03/24/2016 Elsevier Interactive Patient Education  2019 [employer]  

## 2018-04-24 NOTE — Progress Notes (Signed)
MRN : [id number]  [name] is a 68 y.o. (1950/10/04) male who presents with chief complaint of  Chief Complaint  Patient presents with  . Follow-up    [facility] 4week abi  .  History of Present Illness: Patient returns today in follow up of his PAD.  His claudication symptoms at this point are nearly completely resolved.  The pain after his last procedure has resolved and he is feeling well and resume normal activities.  His ABIs today are 1.15 on the right and 1.24 on the left with brisk triphasic waveforms consistent with successful revascularization and no residual arterial insufficiency.  Current Outpatient Medications  Medication Sig Dispense Refill  . AMBULATORY NON FORMULARY MEDICATION Take 180 mg by mouth daily. Medication Name: bempedoic acid vs placebo. CLEAR Research study drug provided    . amLODipine (NORVASC) 5 MG tablet Take 1 tablet (5 mg total) by mouth daily. 90 tablet 3  . aspirin EC 81 MG tablet Take 1 tablet (81 mg total) by mouth daily. 90 tablet 3  . clopidogrel (PLAVIX) 75 MG tablet Take 1 tablet (75 mg total) by mouth daily. 30 tablet 11  . cyclobenzaprine (FLEXERIL) 10 MG tablet Take 1 tablet (10 mg total) by mouth at bedtime. 30 tablet 0  . ibuprofen (ADVIL,MOTRIN) 400 MG tablet Take 400 mg by mouth as needed. 2-3 times per week    . lisinopril (PRINIVIL,ZESTRIL) 20 MG tablet Take 1 tablet (20 mg total) by mouth daily. 90 tablet 2  . meloxicam (MOBIC) 7.5 MG tablet Take 2 tablets (15 mg total) by mouth daily. Take 1-2 tablets daily by mouth for severe joint pains (Patient taking differently: Take 15 mg by mouth daily. Take 1-2 tablets daily by mouth for severe joint pains PRN) 180 tablet 0  . metoprolol succinate (TOPROL-XL) 25 MG 24 hr tablet Take 1 tablet (25 mg total) by mouth daily. 90 tablet 3  . nitroGLYCERIN (NITROSTAT) 0.4 MG SL tablet Place 1 tablet (0.4 mg total) under the tongue every 5 (five) minutes as needed for chest pain. 25 tablet 2  .  pantoprazole (PROTONIX) 40 MG tablet Take 1 tablet (40 mg total) by mouth daily. 90 tablet 3  . traMADol (ULTRAM) 50 MG tablet Take 1 tablet (50 mg total) by mouth every 6 (six) hours as needed for moderate pain. (Patient not taking: Reported on 03/12/2018) 30 tablet 1   No current facility-administered medications for this visit.     Past Medical History:  Diagnosis Date  . Abnormal nuclear cardiac imaging test 08/08/2015  . Arthritis    fingers  . Carotid artery occlusion   . Coronary atherosclerosis of native coronary artery 01/29/2013   stent  . Duodenal erosion   . Encounter for screening for lung cancer 07/13/2016  . Esophageal stenosis    esophageal dilation  . GERD (gastroesophageal reflux disease)   . H. pylori infection   . Heart attack [facility]) Oct. 2009   Mild  . Hiatal hernia   . Hyperlipidemia   . Hypertension   . 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 Cardiac catheterization-nonobstructive 60% RCA disease-subsequent nuclear stress test-9 minutes, low risk, mild inferior wall hypokinesis   . Peripheral vascular disease [facility])     Past Surgical History:  Procedure Laterality Date  . APPENDECTOMY    . BACK SURGERY    . CARDIAC CATHETERIZATION N/A 08/07/2015   Procedure: Left Heart Cath and Coronary Angiography;  Surgeon:  [name], MD;  Location: [place] [facility];  Service: Cardiovascular;  Laterality: N/A;  . CARDIAC CATHETERIZATION N/A 08/07/2015   Procedure: Coronary Stent Intervention;  Surgeon: [name], MD;  Location: [place] [facility];  Service: Cardiovascular;  Laterality: N/A;  . CARDIAC CATHETERIZATION N/A 08/07/2015   Procedure: Coronary Stent Intervention;  Surgeon: [name], MD;  Location: [place] [facility];  Service: Cardiovascular;  Laterality: N/A;  . CAROTID ENDARTERECTOMY  01/05/2006   Right  CEA with DPA  . CATARACT EXTRACTION W/ INTRAOCULAR LENS IMPLANT Left 12/04/2017  . CATARACT  EXTRACTION W/PHACO Left 12/04/2017   Procedure: CATARACT EXTRACTION PHACO AND INTRAOCULAR LENS PLACEMENT ([place]) LEFT;  Surgeon: Eulogio Bear, MD;  Location: [place];  Service: Ophthalmology;  Laterality: Left;  . CATARACT EXTRACTION W/PHACO Right 02/06/2018   Procedure: CATARACT EXTRACTION PHACO AND INTRAOCULAR LENS PLACEMENT (IOC)RIGHT;  Surgeon: Eulogio Bear, MD;  Location: [place];  Service: Ophthalmology;  Laterality: Right;  . COLONOSCOPY    . CORONARY STENT PLACEMENT  08/07/2015   MID [place]  . HIP SURGERY Left 10/2016   left hip tendon repair  . LEFT HEART CATH AND CORONARY ANGIOGRAPHY N/A 11/27/2017   Procedure: LEFT HEART CATH AND CORONARY ANGIOGRAPHY;  Surgeon: Wellington Hampshire, MD;  Location: [place] [facility];  Service: Cardiovascular;  Laterality: N/A;  . LOWER EXTREMITY ANGIOGRAPHY Left 02/12/2018   Procedure: LOWER EXTREMITY ANGIOGRAPHY;  Surgeon: Algernon Huxley, MD;  Location: [place] [facility];  Service: Cardiovascular;  Laterality: Left;  . LOWER EXTREMITY ANGIOGRAPHY Left 03/07/2018   Procedure: LOWER EXTREMITY ANGIOGRAPHY;  Surgeon: Algernon Huxley, MD;  Location: [place] [facility];  Service: Cardiovascular;  Laterality: Left;  . SPINE SURGERY    . TONSILLECTOMY      Social History        Tobacco Use  . Smoking status: Former Smoker    Packs/day: 1.25    Years: 35.00    Pack years: 43.75    Types: Cigarettes    Last attempt to quit: 02/28/2005    Years since quitting: 13.0  . Smokeless tobacco: Current User    Types: Snuff  Substance Use Topics  . Alcohol use: Yes    Alcohol/week: 8.0 - 10.0 standard drinks    Types: 8 - 10 Glasses of wine per week  . Drug use: No      Family History      Family History  Problem Relation Age of Onset  . Heart attack Mother   . Coronary artery disease Mother   . Heart disease Mother        Carotid Stenosis and BPG and Heart Disease before age 76  .  Diabetes Mother   . Hypertension Mother   . Heart attack Father   . Heart disease Father        BPG and Heart Disease before age 44  . Hypertension Father   . Cancer Father 55       throat  . Stroke Father   . Colon cancer Neg Hx   . Colon polyps Neg Hx   . Esophageal cancer Neg Hx   . Rectal cancer Neg Hx   . Stomach cancer Neg Hx           Allergies  Allergen Reactions  . Brilinta [Ticagrelor]     Shortness of breath  . Zetia [Ezetimibe] Other (See Comments)    Muscle aches  . Statins Other (See  Comments)    Failed Crestor 5 mg twice weekly, Crestor 20 mg daily, Pravastatin 40 mg qd, Lipitor, Zocor - muscle aches    REVIEW OF SYSTEMS(Negative unless checked)  Constitutional: [] ??Weight loss[] ??Fever[] ??Chills Cardiac:[] ??Chest pain[] ??Chest pressure[] ??Palpitations [] ??Shortness of breath when laying flat [] ??Shortness of breath at rest [] ??Shortness of breath with exertion. Vascular: [x] ??Pain in legs with walking[] ??Pain in legsat rest[] ??Pain in legs when laying flat [x] ??Claudication [x] ??Pain in feet when walking [] ??Pain in feet at rest [] ??Pain in feet when laying flat [] ??History of DVT [] ??Phlebitis [] ??Swelling in legs [] ??Varicose veins [] ??Non-healing ulcers Pulmonary: [] ??Uses home oxygen [] ??Productive cough[] ??Hemoptysis [] ??Wheeze [] ??COPD [] ??Asthma Neurologic: [] ??Dizziness [] ??Blackouts [] ??Seizures [] ??History of stroke [x] ??History of TIA[] ??Aphasia [x] ??Temporary blindness[] ??Dysphagia [] ??Weaknessor numbness in arms [] ??Weakness or numbnessin legs Musculoskeletal: [x] ??Arthritis [] ??Joint swelling [] ??Joint pain [] ??Low back pain Hematologic:[] ??Easy bruising[] ??Easy bleeding [] ??Hypercoagulable state [] ??Anemic [] ??Hepatitis Gastrointestinal:[] ??Blood in stool[] ??Vomiting blood[] ??Gastroesophageal reflux/heartburn[] ??Abdominal  pain Genitourinary: [] ??Chronic kidney disease [] ??Difficulturination [] ??Frequenturination [] ??Burning with urination[] ??Hematuria Skin: [] ??Rashes [] ??Ulcers [] ??Wounds Psychological: [] ??History of anxiety[] ??History of major depression.    Physical Examination  BP (!) 139/92 (BP Location: Right Arm)   Pulse 77   Resp 16   Ht 5\' 6"  (1.676 m)   Wt 180 lb 12.8 oz (82 kg)   BMI 29.18 kg/m  Gen:  WD/WN, NAD Head: [place]/AT, No temporalis wasting. Ear/Nose/Throat: Hearing grossly intact, nares w/o erythema or drainage Eyes: Conjunctiva clear. Sclera non-icteric Neck: Supple.  Trachea midline Pulmonary:  Good air movement, no use of accessory muscles.  Cardiac: RRR, no JVD Vascular:  Vessel Right Left  Radial Palpable Palpable                          PT Palpable Palpable  DP Palpable Palpable    Musculoskeletal: M/S 5/5 throughout.  No deformity or atrophy. No edema. Neurologic: Sensation grossly intact in extremities.  Symmetrical.  Speech is fluent.  Psychiatric: Judgment intact, Mood & affect appropriate for pt's clinical situation. Dermatologic: No rashes or ulcers noted.  No cellulitis or open wounds.       Labs Recent Results (from the past 2160 hour(s))  BUN     Status: Abnormal   Collection Time: 02/09/18  9:10 AM  Result Value Ref Range   BUN 25 (H) 8 - 23 mg/dL    Comment: Performed at [facility], [address] [place], [place] [address]  Creatinine, serum     Status: None   Collection Time: 02/09/18  9:10 AM  Result Value Ref Range   Creatinine, Ser 0.96 0.61 - 1.24 mg/dL   GFR calc non Af Amer >60 >60 mL/min   GFR calc Af Amer >60 >60 mL/min    Comment: Performed at [facility], [address] [place], [place] [address]  BUN     Status: Abnormal   Collection Time: 03/07/18  1:31 PM  Result Value Ref Range   BUN 30 (H) 8 - 23 mg/dL    Comment: Performed at [facility], [address]  [place], [place] [address]  Creatinine, serum     Status: None   Collection Time: 03/07/18  1:31 PM  Result Value Ref Range   Creatinine, Ser 0.88 0.61 - 1.24 mg/dL   GFR calc non Af Amer >60 >60 mL/min   GFR calc Af Amer >60 >60 mL/min    Comment: Performed at [facility], [address], [place], [place] [address]    Radiology No results found.  Assessment/Plan Hyperlipidemia LDL goal <70 lipid control important  in reducing the progression of atherosclerotic disease.Intolerant to statins   Essential hypertension blood pressure control important in reducing the progression of atherosclerotic disease. On appropriate oral medications.  Carotid stenosis-s/p RCE Checked a couple of months ago. Carotid stenosis in the 40-59% range in the left carotid artery and a patent right CEA. Below the threshold for repair at this point. Recheck in 6 months with duplex.  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  Symptoms markedly improved after intervention.  Continue current medical regimen.  Return in 6 months    [name], MD  04/24/2018 9:16 AM    This note was created with Dragon medical transcription system.  Any errors from dictation are purely unintentional

## 2018-04-24 NOTE — Assessment & Plan Note (Signed)
His ABIs today are 1.15 on the right and 1.24 on the left with brisk triphasic waveforms consistent with successful revascularization and no residual arterial insufficiency.  Symptoms markedly improved after intervention.  Continue current medical regimen.  Return in 6 months

## 2018-05-08 DIAGNOSIS — M76892 Other specified enthesopathies of left lower limb, excluding foot: Secondary | ICD-10-CM | POA: Diagnosis not present

## 2018-05-08 DIAGNOSIS — M47816 Spondylosis without myelopathy or radiculopathy, lumbar region: Secondary | ICD-10-CM | POA: Diagnosis not present

## 2018-05-17 ENCOUNTER — Other Ambulatory Visit: Payer: Self-pay

## 2018-05-17 ENCOUNTER — Encounter: Payer: Self-pay | Admitting: Internal Medicine

## 2018-05-17 ENCOUNTER — Ambulatory Visit (INDEPENDENT_AMBULATORY_CARE_PROVIDER_SITE_OTHER): Payer: PPO | Admitting: Internal Medicine

## 2018-05-17 VITALS — BP 118/70 | HR 60 | Ht 66.0 in | Wt 175.0 lb

## 2018-05-17 DIAGNOSIS — I739 Peripheral vascular disease, unspecified: Secondary | ICD-10-CM

## 2018-05-17 DIAGNOSIS — M25552 Pain in left hip: Secondary | ICD-10-CM

## 2018-05-17 DIAGNOSIS — Z125 Encounter for screening for malignant neoplasm of prostate: Secondary | ICD-10-CM | POA: Diagnosis not present

## 2018-05-17 DIAGNOSIS — I10 Essential (primary) hypertension: Secondary | ICD-10-CM | POA: Diagnosis not present

## 2018-05-17 DIAGNOSIS — Z1211 Encounter for screening for malignant neoplasm of colon: Secondary | ICD-10-CM

## 2018-05-17 DIAGNOSIS — Z0000 Encounter for general adult medical examination without abnormal findings: Secondary | ICD-10-CM | POA: Diagnosis not present

## 2018-05-17 DIAGNOSIS — M5134 Other intervertebral disc degeneration, thoracic region: Secondary | ICD-10-CM | POA: Diagnosis not present

## 2018-05-17 DIAGNOSIS — E785 Hyperlipidemia, unspecified: Secondary | ICD-10-CM | POA: Diagnosis not present

## 2018-05-17 DIAGNOSIS — R1310 Dysphagia, unspecified: Secondary | ICD-10-CM | POA: Diagnosis not present

## 2018-05-17 DIAGNOSIS — G8929 Other chronic pain: Secondary | ICD-10-CM | POA: Diagnosis not present

## 2018-05-17 DIAGNOSIS — I25118 Atherosclerotic heart disease of native coronary artery with other forms of angina pectoris: Secondary | ICD-10-CM

## 2018-05-17 LAB — POCT URINALYSIS DIPSTICK
Bilirubin, UA: NEGATIVE
Blood, UA: NEGATIVE
Glucose, UA: NEGATIVE
Ketones, UA: NEGATIVE
Leukocytes, UA: NEGATIVE
NITRITE UA: NEGATIVE
Protein, UA: NEGATIVE
Spec Grav, UA: 1.015 (ref 1.010–1.025)
Urobilinogen, UA: 0.2 E.U./dL
pH, UA: 6 (ref 5.0–8.0)

## 2018-05-17 MED ORDER — MELOXICAM 7.5 MG PO TABS: 7.5000 mg | ORAL_TABLET | Freq: Every day | ORAL | 0 refills | Status: DC

## 2018-05-17 MED ORDER — CYCLOBENZAPRINE HCL 10 MG PO TABS: 10.0000 mg | ORAL_TABLET | Freq: Three times a day (TID) | ORAL | 0 refills | Status: DC | PRN

## 2018-05-17 NOTE — Progress Notes (Signed)
Date:  05/17/2018   Name:  [name]   DOB:  [birth year]   MRN:  [id number]   Chief Complaint: Annual Exam [name] is a 68 y.o. male who presents today for his Complete Annual Exam. He feels fairly well. He reports exercising none currently. He reports he is sleeping well.  Last colonoscopy: 2010 Last PSA: ? Last Tdap: 2014 Pt is not interested in Shringrix.  Hypertension  This is a chronic problem. The problem is unchanged. The problem is controlled. Pertinent negatives include no chest pain, headaches, palpitations or shortness of breath. Past treatments include ACE inhibitors, beta blockers and calcium channel blockers.  CAD - followed by Cardiology, cardiac cath was done in [place] - patent Cx artery and ECHO in January showed improved EF 40-45%.  No medication changes were made. He is on a study drug for cholesterol (oral).  PVD - recent stents in both legs.  Doing well with no pain or swelling.  He has not been exercising due to restrictions from [place].  Hip pain - has a torn tendon in the left hip that is mildly painful.  He gets cortisone injections intermittently.  He takes Mobic as needed.  Dysphagia - he has had EGD and eso dilatation and takes PPI daily.  He does not have GERD or hx of ulcers.  He rarely has episodes of food sticking in his upper esophagus.  Review of Systems  Constitutional: Negative for chills, fatigue and fever.  HENT: Negative for hearing loss and trouble swallowing.   Eyes: Negative for visual disturbance.  Respiratory: Negative for cough, chest tightness, shortness of breath and wheezing.   Cardiovascular: Negative for chest pain, palpitations and leg swelling.  Gastrointestinal: Negative for abdominal pain, blood in stool, constipation and diarrhea.  Genitourinary: Negative for difficulty urinating, hematuria and urgency.  Musculoskeletal: Positive for back pain. Negative for gait problem and joint swelling. Arthralgias: thumb, hip and  back.  Allergic/Immunologic: Negative for environmental allergies.  Neurological: Negative for dizziness, light-headedness and headaches.  Hematological: Negative for adenopathy.  Psychiatric/Behavioral: Negative for dysphoric mood and sleep disturbance.    Patient Active Problem List   Diagnosis Date Noted  . Chronic systolic heart failure ([place]) 03/12/2018  . Pain in limb 12/19/2017  . Nonischemic cardiomyopathy ([place]) 12/08/2017  . Myalgia due to statin 12/08/2017  . Moderate risk chest pain 11/24/2017  . 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 Foot pain, bilateral- chronic many yrs 05/01/2017  . Disorder of tendon 03/01/2017  . 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 Tobacco use disorder, moderate, in sustained remission 07/13/2016  . Overweight (BMI 25.0-29.9) 07/13/2016  . 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[place]) 07/13/2016  . Dysphagia 11/26/2015  . Chronic anticoagulation: plavix 10/15/2015  . Chronic left hip pain 09/15/2015  . Greater trochanteric bursitis of left hip 09/15/2015  . Coronary artery disease of native heart with stable angina pectoris ([place]) 08/08/2015  . Essential hypertension 08/08/2015  . Hyperlipidemia LDL goal <70 01/29/2013  . Carotid stenosis-s/p RCE 11/08/2011    Allergies  Allergen Reactions  . Brilinta [Ticagrelor]     Shortness of breath  . Zetia [Ezetimibe] Other (See Comments)    Muscle aches  . Statins Other (See Comments)    Failed Crestor 5 mg twice weekly, Crestor 20 mg daily, Pravastatin 40 mg qd, Lipitor, Zocor - muscle aches    Past Surgical History:  Procedure Laterality Date  . APPENDECTOMY    . BACK SURGERY    .  CARDIAC CATHETERIZATION N/A 08/07/2015   Procedure: Left Heart Cath and Coronary Angiography;  Surgeon: [name], MD;  Location: [place] [facility];  Service: Cardiovascular;  Laterality: N/A;  . CARDIAC CATHETERIZATION N/A 08/07/2015   Procedure: Coronary Stent Intervention;   Surgeon: [name], MD;  Location: [place] [facility];  Service: Cardiovascular;  Laterality: N/A;  . CARDIAC CATHETERIZATION N/A 08/07/2015   Procedure: Coronary Stent Intervention;  Surgeon: [name], MD;  Location: [place] [facility];  Service: Cardiovascular;  Laterality: N/A;  . CAROTID ENDARTERECTOMY  01/05/2006   Right  CEA with DPA  . CATARACT EXTRACTION W/ INTRAOCULAR LENS IMPLANT Left 12/04/2017  . CATARACT EXTRACTION W/PHACO Left 12/04/2017   Procedure: CATARACT EXTRACTION PHACO AND INTRAOCULAR LENS PLACEMENT ([place]) LEFT;  Surgeon: Eulogio Bear, MD;  Location: [place];  Service: Ophthalmology;  Laterality: Left;  . CATARACT EXTRACTION W/PHACO Right 02/06/2018   Procedure: CATARACT EXTRACTION PHACO AND INTRAOCULAR LENS PLACEMENT (I[facility])RIGHT;  Surgeon: Eulogio Bear, MD;  Location: [place];  Service: Ophthalmology;  Laterality: Right;  . COLONOSCOPY    . CORONARY STENT PLACEMENT  08/07/2015   MID [place]  . HIP SURGERY Left 10/2016   left hip tendon repair  . LEFT HEART CATH AND CORONARY ANGIOGRAPHY N/A 11/27/2017   Procedure: LEFT HEART CATH AND CORONARY ANGIOGRAPHY;  Surgeon: Wellington Hampshire, MD;  Location: [place] [facility];  Service: Cardiovascular;  Laterality: N/A;  . LOWER EXTREMITY ANGIOGRAPHY Left 02/12/2018   Procedure: LOWER EXTREMITY ANGIOGRAPHY;  Surgeon: Algernon Huxley, MD;  Location: [place] [facility];  Service: Cardiovascular;  Laterality: Left;  . LOWER EXTREMITY ANGIOGRAPHY Left 03/07/2018   Procedure: LOWER EXTREMITY ANGIOGRAPHY;  Surgeon: Algernon Huxley, MD;  Location: [place] [facility];  Service: Cardiovascular;  Laterality: Left;  . SPINE SURGERY    . TONSILLECTOMY      Social History   Tobacco Use  . Smoking status: Former Smoker    Packs/day: 1.25    Years: 35.00    Pack years: 43.75    Types: Cigarettes    Last attempt to quit: 02/28/2005    Years since quitting: 13.2  . Smokeless tobacco: Current User     Types: Snuff  Substance Use Topics  . Alcohol use: Yes    Alcohol/week: 8.0 - 10.0 standard drinks    Types: 8 - 10 Glasses of wine per week  . Drug use: No     Medication list has been reviewed and updated.  Current Meds  Medication Sig  . AMBULATORY NON FORMULARY MEDICATION Take 180 mg by mouth daily. Medication Name: bempedoic acid vs placebo. CLEAR Research study drug provided  . amLODipine (NORVASC) 5 MG tablet Take 1 tablet (5 mg total) by mouth daily.  Mar[place] Kitchen aspirin EC 81 MG tablet Take 1 tablet (81 mg total) by mouth daily.  . clopidogrel (PLAVIX) 75 MG tablet Take 1 tablet (75 mg total) by mouth daily.  Marland Kitchen ibuprofen (ADVIL,MOTRIN) 400 MG tablet Take 400 mg by mouth as needed. 2-3 times per week  . lisinopril (PRINIVIL,ZESTRIL) 20 MG tablet Take 1 tablet (20 mg total) by mouth daily.  . meloxicam (MOBIC) 7.5 MG tablet Take 2 tablets (15 mg total) by mouth daily. Take 1-2 tablets daily by mouth for severe joint pains (Patient taking differently: Take 15 mg by mouth daily. Take 1-2 tablets daily by mouth for severe joint pains PRN)  . metoprolol succinate (TOPROL-XL) 25 MG 24 hr tablet  Take 1 tablet (25 mg total) by mouth daily.  . nitroGLYCERIN (NITROSTAT) 0.4 MG SL tablet Place 1 tablet (0.4 mg total) under the tongue every 5 (five) minutes as needed for chest pain.  . pantoprazole (PROTONIX) 40 MG tablet Take 1 tablet (40 mg total) by mouth daily.  . traMADol (ULTRAM) 50 MG tablet Take 1 tablet (50 mg total) by mouth every 6 (six) hours as needed for moderate pain.    PHQ 2/9 Scores 09/06/2017 05/01/2017 01/06/2017 07/13/2016  PHQ - 2 Score 0 0 0 0  PHQ- 9 Score 0 1 0 0    Physical Exam Vitals signs and nursing note reviewed.  Constitutional:      Appearance: Normal appearance. He is well-developed.  HENT:     Head: Normocephalic.     Right Ear: Tympanic membrane, ear canal and external ear normal.     Left Ear: Tympanic membrane, ear canal and external ear normal.     Nose:  Nose normal.     Mouth/Throat:     Pharynx: Uvula midline.  Eyes:     Conjunctiva/sclera: Conjunctivae normal.     Pupils: Pupils are equal, round, and reactive to light.  Neck:     Musculoskeletal: Normal range of motion and neck supple. No muscular tenderness.     Thyroid: No thyromegaly.     Vascular: No carotid bruit.  Cardiovascular:     Rate and Rhythm: Normal rate and regular rhythm.  No extrasystoles are present.    Chest Wall: PMI is not displaced.     Pulses:          Radial pulses are 2+ on the right side and 2+ on the left side.       Femoral pulses are 2+ on the right side and 2+ on the left side.      Posterior tibial pulses are 1+ on the right side and 1+ on the left side.     Heart sounds: Normal heart sounds. No murmur.  Pulmonary:     Effort: Pulmonary effort is normal.     Breath sounds: Normal breath sounds. No wheezing or rales.  Chest:     Breasts:        Right: No mass.        Left: No mass.  Abdominal:     General: Abdomen is flat. Bowel sounds are normal.     Palpations: Abdomen is soft. There is no mass.     Tenderness: There is no abdominal tenderness.     Hernia: No hernia is present.  Musculoskeletal:     Right lower leg: No edema.     Left lower leg: No edema.     Comments: Left thumb - OA changes, no warmth, redness, tenderness  Lymphadenopathy:     Cervical: No cervical adenopathy.  Skin:    General: Skin is warm and dry.  Neurological:     Mental Status: He is alert and oriented to person, place, and time.     Deep Tendon Reflexes: Reflexes are normal and symmetric.  Psychiatric:        Speech: Speech normal.        Behavior: Behavior normal.        Thought Content: Thought content normal.        Judgment: Judgment normal.     Wt Readings from Last 3 Encounters:  05/17/18 175 lb (79.4 kg)  04/24/18 180 lb 12.8 oz (82 kg)  03/12/18 179 lb (81.2 kg)  BP 118/70   Pulse 60   Ht 5\' 6"  (1.676 m)   Wt 175 lb (79.4 kg)   SpO2 94%    BMI 28.25 kg/m   Assessment and Plan: 1. Annual physical exam Normal exam Resume regular exercise - POCT urinalysis dipstick  2. Prostate cancer screening DRE deferred - PSA  3. Degenerative disc disease, thoracic Use mobic PRN only; avoid advil and aleve - meloxicam (MOBIC) 7.5 MG tablet; Take 1 tablet (7.5 mg total) by mouth daily.  Dispense: 90 tablet; Refill: 0 - cyclobenzaprine (FLEXERIL) 10 MG tablet; Take 1 tablet (10 mg total) by mouth 3 (three) times daily as needed for muscle spasms.  Dispense: 60 tablet; Refill: 0  4. Coronary artery disease of native artery of native heart with stable angina pectoris ([place]) Stable, followed by Cardiology  5. Essential hypertension controlled - CBC with Differential/Platelet - Comprehensive metabolic panel  6. 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 Hyperlipidemia LDL goal <70 On study drug Intolerant of statins - Lipid panel  8. Dysphagia, unspecified type Continue PPI, follow up if sx become more frequent - CBC with Differential/Platelet  9. Colon cancer screening - Ambulatory referral to Gastroenterology  10. Chronic left hip pain mobic PRN Can also use topical rubs for joints like thumb/hand   Partially dictated using Editor, commissioning. Any errors are unintentional.  Halina Maidens, MD [facility]  05/17/2018

## 2018-05-18 LAB — CBC WITH DIFFERENTIAL/PLATELET
Basophils Absolute: 0 10*3/uL (ref 0.0–0.2)
Basos: 1 %
EOS (ABSOLUTE): 0.2 10*3/uL (ref 0.0–0.4)
Eos: 4 %
HEMATOCRIT: 42.6 % (ref 37.5–51.0)
Hemoglobin: 14.6 g/dL (ref 13.0–17.7)
Immature Grans (Abs): 0.1 10*3/uL (ref 0.0–0.1)
Immature Granulocytes: 1 %
Lymphocytes Absolute: 1.4 10*3/uL (ref 0.7–3.1)
Lymphs: 30 %
MCH: 30.2 pg (ref 26.6–33.0)
MCHC: 34.3 g/dL (ref 31.5–35.7)
MCV: 88 fL (ref 79–97)
Monocytes Absolute: 0.6 10*3/uL (ref 0.1–0.9)
Monocytes: 12 %
Neutrophils Absolute: 2.4 10*3/uL (ref 1.4–7.0)
Neutrophils: 52 %
Platelets: 233 10*3/uL (ref 150–450)
RBC: 4.83 x10E6/uL (ref 4.14–5.80)
RDW: 13 % (ref 11.6–15.4)
WBC: 4.7 10*3/uL (ref 3.4–10.8)

## 2018-05-18 LAB — LIPID PANEL
Chol/HDL Ratio: 3 ratio (ref 0.0–5.0)
Cholesterol, Total: 216 mg/dL — ABNORMAL HIGH (ref 100–199)
HDL: 71 mg/dL (ref >39)
LDL Calculated: 127 mg/dL — ABNORMAL HIGH (ref 0–99)
TRIGLYCERIDES: 88 mg/dL (ref 0–149)
VLDL Cholesterol Cal: 18 mg/dL (ref 5–40)

## 2018-05-18 LAB — COMPREHENSIVE METABOLIC PANEL
ALT: 22 IU/L (ref 0–44)
AST: 27 IU/L (ref 0–40)
Albumin/Globulin Ratio: 1.5 (ref 1.2–2.2)
Albumin: 4.8 g/dL (ref 3.8–4.8)
Alkaline Phosphatase: 52 IU/L (ref 39–117)
BUN/Creatinine Ratio: 31 — ABNORMAL HIGH (ref 10–24)
BUN: 33 mg/dL — ABNORMAL HIGH (ref 8–27)
Bilirubin Total: 0.7 mg/dL (ref 0.0–1.2)
CO2: 21 mmol/L (ref 20–29)
Calcium: 9.7 mg/dL (ref 8.6–10.2)
Chloride: 98 mmol/L (ref 96–106)
Creatinine, Ser: 1.08 mg/dL (ref 0.76–1.27)
GFR calc Af Amer: 81 mL/min/{1.73_m2} (ref >59)
GFR calc non Af Amer: 70 mL/min/{1.73_m2} (ref >59)
GLOBULIN, TOTAL: 3.2 g/dL (ref 1.5–4.5)
Glucose: 94 mg/dL (ref 65–99)
POTASSIUM: 5.6 mmol/L — AB (ref 3.5–5.2)
SODIUM: 138 mmol/L (ref 134–144)
Total Protein: 8 g/dL (ref 6.0–8.5)

## 2018-05-18 LAB — PSA: Prostate Specific Ag, Serum: 0.6 ng/mL (ref 0.0–4.0)

## 2018-05-21 ENCOUNTER — Other Ambulatory Visit: Payer: Self-pay | Admitting: Gastroenterology

## 2018-06-01 ENCOUNTER — Ambulatory Visit (INDEPENDENT_AMBULATORY_CARE_PROVIDER_SITE_OTHER): Payer: PPO | Admitting: Nurse Practitioner

## 2018-06-01 ENCOUNTER — Telehealth (INDEPENDENT_AMBULATORY_CARE_PROVIDER_SITE_OTHER): Payer: Self-pay

## 2018-06-01 ENCOUNTER — Other Ambulatory Visit: Payer: Self-pay

## 2018-06-01 ENCOUNTER — Ambulatory Visit (INDEPENDENT_AMBULATORY_CARE_PROVIDER_SITE_OTHER): Payer: PPO

## 2018-06-01 ENCOUNTER — Encounter (INDEPENDENT_AMBULATORY_CARE_PROVIDER_SITE_OTHER): Payer: Self-pay | Admitting: Nurse Practitioner

## 2018-06-01 VITALS — BP 131/82 | HR 61 | Resp 15 | Ht 66.0 in | Wt 174.0 lb

## 2018-06-01 DIAGNOSIS — E785 Hyperlipidemia, unspecified: Secondary | ICD-10-CM | POA: Diagnosis not present

## 2018-06-01 DIAGNOSIS — Z95820 Peripheral vascular angioplasty status with implants and grafts: Secondary | ICD-10-CM

## 2018-06-01 DIAGNOSIS — I10 Essential (primary) hypertension: Secondary | ICD-10-CM

## 2018-06-01 DIAGNOSIS — J449 Chronic obstructive pulmonary disease, unspecified: Secondary | ICD-10-CM | POA: Diagnosis not present

## 2018-06-01 DIAGNOSIS — Z79899 Other long term (current) drug therapy: Secondary | ICD-10-CM

## 2018-06-01 DIAGNOSIS — F17290 Nicotine dependence, other tobacco product, uncomplicated: Secondary | ICD-10-CM | POA: Diagnosis not present

## 2018-06-01 DIAGNOSIS — I739 Peripheral vascular disease, unspecified: Secondary | ICD-10-CM

## 2018-06-01 DIAGNOSIS — Z791 Long term (current) use of non-steroidal anti-inflammatories (NSAID): Secondary | ICD-10-CM

## 2018-06-01 DIAGNOSIS — M5134 Other intervertebral disc degeneration, thoracic region: Secondary | ICD-10-CM | POA: Diagnosis not present

## 2018-06-01 DIAGNOSIS — R0602 Shortness of breath: Secondary | ICD-10-CM | POA: Diagnosis not present

## 2018-06-01 NOTE — Telephone Encounter (Signed)
Spoke with the patient and went over all pre-procedure instructions as well as the zero visitor policy as well.

## 2018-06-01 NOTE — Telephone Encounter (Signed)
Patient left a message stating on yesterday while doing yard work that he started having left calf pain and last night his left foot was very cold.He informed me that he stop taking his Plavix less than 2weeks ago but if he needed to start back taking he would need a new refill and he informed this morning his foot is not as cold as last night.I spoke with Dew and he advise for the patient to come in for abi and see Arna Medici today.

## 2018-06-03 ENCOUNTER — Encounter (INDEPENDENT_AMBULATORY_CARE_PROVIDER_SITE_OTHER): Payer: Self-pay | Admitting: Nurse Practitioner

## 2018-06-03 NOTE — Progress Notes (Signed)
SUBJECTIVE:  Patient ID: [name], male    DOB: 05-25-1950, 68 y.o.   MRN: [id number] Chief Complaint  Patient presents with  . Follow-up    left calf pain    HPI  [name] is a 68 y.o. male The patient returns to the office for followup and review of the noninvasive studies. There has been a significant deterioration in the lower extremity symptoms.  The patient notes interval shortening of their claudication distance and development of mild rest pain symptoms. No new ulcers or wounds have occurred since the last visit.  Patient reports that the pain began suddenly after normal at work.  He denies smoking however according to records he uses snuff.  After working the yard he suddenly had intense pain in his calf and his foot was very cold.  He also noted that he stopped his Plavix approximately 2 weeks ago.  There have been no significant changes to the patient's overall health care.  The patient denies amaurosis fugax or recent TIA symptoms. There are no recent neurological changes noted. The patient denies history of DVT, PE or superficial thrombophlebitis. The patient denies recent episodes of angina or shortness of breath.   ABI's Rt=1.05 and Lt=0.75 (previous ABI's Rt=1.15 and Lt=1.24) Duplex US of the left lower extremity arterial system appears diminished at the SFA distal.  There are blunted waveforms seen in the anterior tibial artery, posterior tibial artery and peroneal artery at the level of the ankle.  The left popliteal artery is also occluded.  The left toe waveform is nearly flat.  The right lower extremity has triphasic tibial artery waveforms with strong toe waveforms.  Past Medical History:  Diagnosis Date  . Abnormal nuclear cardiac imaging test 08/08/2015  . Arthritis    fingers  . Carotid artery occlusion   . Coronary atherosclerosis of native coronary artery 01/29/2013   stent  . Duodenal erosion   . Encounter for screening for lung cancer 07/13/2016   . Esophageal stenosis    esophageal dilation  . GERD (gastroesophageal reflux disease)   . H. pylori infection   . Heart attack [facility]) Oct. 2009   Mild  . Hiatal hernia   . Hyperlipidemia   . Hypertension   . 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 Cardiac catheterization-nonobstructive 60% RCA disease-subsequent nuclear stress test-9 minutes, low risk, mild inferior wall hypokinesis   . Pain in limb 12/19/2017  . Peripheral vascular disease ([place])   . Unstable angina ([place]) 11/25/2017    Past Surgical History:  Procedure Laterality Date  . APPENDECTOMY    . BACK SURGERY    . CARDIAC CATHETERIZATION N/A 08/07/2015   Procedure: Left Heart Cath and Coronary Angiography;  Surgeon: [name], MD;  Location: [place] [facility];  Service: Cardiovascular;  Laterality: N/A;  . CARDIAC CATHETERIZATION N/A 08/07/2015   Procedure: Coronary Stent Intervention;  Surgeon: [name], MD;  Location: [place] [facility];  Service: Cardiovascular;  Laterality: N/A;  . CARDIAC CATHETERIZATION N/A 08/07/2015   Procedure: Coronary Stent Intervention;  Surgeon: [name], MD;  Location: [place] [facility];  Service: Cardiovascular;  Laterality: N/A;  . CAROTID ENDARTERECTOMY  01/05/2006   Right  CEA with DPA  . CATARACT EXTRACTION W/ INTRAOCULAR LENS IMPLANT Left 12/04/2017  . CATARACT EXTRACTION W/PHACO Left 12/04/2017   Procedure: CATARACT EXTRACTION PHACO AND INTRAOCULAR LENS PLACEMENT ([place]) LEFT;  Surgeon: Eulogio Bear, MD;  Location: [place];  Service: Ophthalmology;  Laterality: Left;  . CATARACT EXTRACTION W/PHACO Right 02/06/2018   Procedure: CATARACT EXTRACTION PHACO AND INTRAOCULAR LENS PLACEMENT ([facility])RIGHT;  Surgeon: Eulogio Bear, MD;  Location: [place];  Service: Ophthalmology;  Laterality: Right;  . COLONOSCOPY  05/20/2008  . CORONARY STENT PLACEMENT  08/07/2015   MID [place]  . HIP SURGERY  Left 10/2016   left hip tendon repair  . LEFT HEART CATH AND CORONARY ANGIOGRAPHY N/A 11/27/2017   Procedure: LEFT HEART CATH AND CORONARY ANGIOGRAPHY;  Surgeon: Wellington Hampshire, MD;  Location: [place] [facility];  Service: Cardiovascular;  Laterality: N/A;  . LOWER EXTREMITY ANGIOGRAPHY Left 02/12/2018   Procedure: LOWER EXTREMITY ANGIOGRAPHY;  Surgeon: Algernon Huxley, MD;  Location: [place] [facility];  Service: Cardiovascular;  Laterality: Left;  . LOWER EXTREMITY ANGIOGRAPHY Left 03/07/2018   Procedure: LOWER EXTREMITY ANGIOGRAPHY;  Surgeon: Algernon Huxley, MD;  Location: [place] [facility];  Service: Cardiovascular;  Laterality: Left;  . SPINE SURGERY    . TONSILLECTOMY      Social History   Socioeconomic History  . Marital status: Married    Spouse name: Not on file  . Number of children: 1  . Years of education: Not on file  . Highest education level: Not on file  Occupational History  . Not on file  Social Needs  . Financial resource strain: Not on file  . Food insecurity:    Worry: Not on file    Inability: Not on file  . Transportation needs:    Medical: Not on file    Non-medical: Not on file  Tobacco Use  . Smoking status: Former Smoker    Packs/day: 1.25    Years: 35.00    Pack years: 43.75    Types: Cigarettes    Last attempt to quit: 02/28/2005    Years since quitting: 13.2  . Smokeless tobacco: Current User    Types: Snuff  Substance and Sexual Activity  . Alcohol use: Yes    Alcohol/week: 8.0 - 10.0 standard drinks    Types: 8 - 10 Glasses of wine per week  . Drug use: No  . Sexual activity: Yes  Lifestyle  . Physical activity:    Days per week: Not on file    Minutes per session: Not on file  . Stress: Not on file  Relationships  . Social connections:    Talks on phone: Not on file    Gets together: Not on file    Attends religious service: Not on file    Active member of club or organization: Not on file    Attends meetings of clubs or  organizations: Not on file    Relationship status: Not on file  . Intimate partner violence:    Fear of current or ex partner: Not on file    Emotionally abused: Not on file    Physically abused: Not on file    Forced sexual activity: Not on file  Other Topics Concern  . Not on file  Social History Narrative  . Not on file    Family History  Problem Relation Age of Onset  . Heart attack Mother   . Coronary artery disease Mother   . Heart disease Mother        Carotid Stenosis and BPG and Heart Disease before age 14  . Diabetes Mother   . Hypertension Mother   . Heart attack Father   . Heart  disease Father        BPG and Heart Disease before age 22  . Hypertension Father   . Cancer Father 55       throat  . Stroke Father   . Colon cancer Neg Hx   . Colon polyps Neg Hx   . Esophageal cancer Neg Hx   . Rectal cancer Neg Hx   . Stomach cancer Neg Hx     Allergies  Allergen Reactions  . Brilinta [Ticagrelor]     Shortness of breath  . Zetia [Ezetimibe] Other (See Comments)    Muscle aches  . Statins Other (See Comments)    Failed Crestor 5 mg twice weekly, Crestor 20 mg daily, Pravastatin 40 mg qd, Lipitor, Zocor - muscle aches     Review of Systems   Review of Systems: Negative Unless Checked Constitutional: [] Weight loss  [] Fever  [] Chills Cardiac: [] Chest pain   []  Atrial Fibrillation  [] Palpitations   [] Shortness of breath when laying flat   [] Shortness of breath with exertion. [] Shortness of breath at rest Vascular:  [x] Pain in legs with walking   [] Pain in legs with standing [] Pain in legs when laying flat   [] Claudication    [x] Pain in feet when laying flat    [] History of DVT   [] Phlebitis   [] Swelling in legs   [] Varicose veins   [] Non-healing ulcers Pulmonary:   [] Uses home oxygen   [] Productive cough   [] Hemoptysis   [] Wheeze  [] COPD   [] Asthma Neurologic:  [] Dizziness   [] Seizures  [] Blackouts [] History of stroke   [] History of TIA  [] Aphasia   [] Temporary  Blindness   [] Weakness or numbness in arm   [] Weakness or numbness in leg Musculoskeletal:   [] Joint swelling   [x] Joint pain   [] Low back pain  []  History of Knee Replacement [] Arthritis [] back Surgeries  []  Spinal Stenosis    Hematologic:  [] Easy bruising  [] Easy bleeding   [] Hypercoagulable state   [] Anemic Gastrointestinal:  [] Diarrhea   [] Vomiting  [x] Gastroesophageal reflux/heartburn   [] Difficulty swallowing. [] Abdominal pain Genitourinary:  [] Chronic kidney disease   [] Difficult urination  [] Anuric   [] Blood in urine [] Frequent urination  [] Burning with urination   [] Hematuria Skin:  [] Rashes   [] Ulcers [] Wounds Psychological:  [] History of anxiety   []  History of major depression  []  Memory Difficulties      OBJECTIVE:   Physical Exam  BP 131/82 (BP Location: Right Arm)   Pulse 61   Resp 15   Ht 5\' 6"  (1.676 m)   Wt 174 lb (78.9 kg)   BMI 28.08 kg/m   Gen: WD/WN, NAD Head: [place]/AT, No temporalis wasting.  Ear/Nose/Throat: Hearing grossly intact, nares w/o erythema or drainage Eyes: PER, EOMI, sclera nonicteric.  Neck: Supple, no masses.  No JVD.  Pulmonary:  Good air movement, no use of accessory muscles.  Cardiac: RRR Vascular:  Cool left foot Vessel Right Left  Radial Palpable Palpable  Dorsalis Pedis Palpable Not Palpable  Posterior Tibial Palpable Not Palpable   Gastrointestinal: soft, non-distended. No guarding/no peritoneal signs.  Musculoskeletal: M/S 5/5 throughout.  No deformity or atrophy.  Neurologic: Pain and light touch intact in extremities.  Symmetrical.  Speech is fluent. Motor exam as listed above. Psychiatric: Judgment intact, Mood & affect appropriate for pt's clinical situation. Dermatologic: No Venous rashes. No Ulcers Noted.  No changes consistent with cellulitis. Lymph : No Cervical lymphadenopathy, no lichenification or skin changes of chronic lymphedema.       ASSESSMENT  AND PLAN:  1. 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  This represents a limb threatening ischemia and places the patient at the risk for limb loss.  Patient should undergo angiography of the left lower extremities with the hope for intervention for limb salvage.  The risks and benefits as well as the alternative therapies was discussed in detail with the patient.  All questions were answered.  Patient agrees to proceed with angiography.  The patient will follow up with me in the office after the procedure.     Due to the COVID-19 concerns this procedure is considered an urgent and necessary procedure.  Delaying this procedure would result in possible limb threatening circumstances.  The patient will be scheduled for his procedure soon as possible.  2. Essential hypertension Continue antihypertensive medications as already ordered, these medications have been reviewed and there are no changes at this time.   3. Hyperlipidemia LDL goal <70 Continue statin as ordered and reviewed, no changes at this time   4. Degenerative disc disease, thoracic Continue NSAID medications as already ordered, these medications have been reviewed and there are no changes at this time.  Continued activity and therapy was stressed.    Current Outpatient Medications on File Prior to Visit  Medication Sig Dispense Refill  . AMBULATORY NON FORMULARY MEDICATION Take 180 mg by mouth daily. Medication Name: bempedoic acid vs placebo. CLEAR Research study drug provided    . amLODipine (NORVASC) 5 MG tablet Take 1 tablet (5 mg total) by mouth daily. 90 tablet 3  . aspirin EC 81 MG tablet Take 1 tablet (81 mg total) by mouth daily. 90 tablet 3  . clopidogrel (PLAVIX) 75 MG tablet Take 1 tablet (75 mg total) by mouth daily. 30 tablet 11  . cyclobenzaprine  (FLEXERIL) 10 MG tablet Take 1 tablet (10 mg total) by mouth 3 (three) times daily as needed for muscle spasms. 60 tablet 0  . lisinopril (PRINIVIL,ZESTRIL) 20 MG tablet Take 1 tablet (20 mg total) by mouth daily. 90 tablet 2  . meloxicam (MOBIC) 7.5 MG tablet Take 1 tablet (7.5 mg total) by mouth daily. (Patient taking differently: Take 7.5 mg by mouth as needed. ) 90 tablet 0  . metoprolol succinate (TOPROL-XL) 25 MG 24 hr tablet Take 1 tablet (25 mg total) by mouth daily. 90 tablet 3  . nitroGLYCERIN (NITROSTAT) 0.4 MG SL tablet Place 1 tablet (0.4 mg total) under the tongue every 5 (five) minutes as needed for chest pain. 25 tablet 2  . pantoprazole (PROTONIX) 40 MG tablet TAKE 1 TABLET BY MOUTH EVERY DAY 90 tablet 0  . traMADol (ULTRAM) 50 MG tablet Take 1 tablet (50 mg total) by mouth every 6 (six) hours as needed for moderate pain. 30 tablet 1   No current facility-administered medications on file prior to visit.     There are no Patient Instructions on file for this visit. No follow-ups on file.   Kris Hartmann, NP  This note was completed with Sales executive.  Any errors are purely unintentional.

## 2018-06-04 ENCOUNTER — Other Ambulatory Visit: Payer: Self-pay

## 2018-06-04 ENCOUNTER — Observation Stay
Admission: RE | Admit: 2018-06-04 | Discharge: 2018-06-05 | Disposition: A | Discharge status: Home / Self Care | Payer: PPO | Attending: Vascular Surgery | Admitting: Vascular Surgery

## 2018-06-04 ENCOUNTER — Encounter
Admission: RE | Disposition: A | Discharge status: Home / Self Care | Payer: Self-pay | Source: Home / Self Care | Attending: Vascular Surgery

## 2018-06-04 DIAGNOSIS — K432 Incisional hernia without obstruction or gangrene: Secondary | ICD-10-CM | POA: Diagnosis not present

## 2018-06-04 DIAGNOSIS — I129 Hypertensive chronic kidney disease with stage 1 through stage 4 chronic kidney disease, or unspecified chronic kidney disease: Secondary | ICD-10-CM | POA: Diagnosis not present

## 2018-06-04 DIAGNOSIS — E1142 Type 2 diabetes mellitus with diabetic polyneuropathy: Secondary | ICD-10-CM | POA: Diagnosis not present

## 2018-06-04 DIAGNOSIS — M19042 Primary osteoarthritis, left hand: Secondary | ICD-10-CM | POA: Diagnosis not present

## 2018-06-04 DIAGNOSIS — M79652 Pain in left thigh: Secondary | ICD-10-CM | POA: Diagnosis not present

## 2018-06-04 DIAGNOSIS — Z8249 Family history of ischemic heart disease and other diseases of the circulatory system: Secondary | ICD-10-CM | POA: Diagnosis not present

## 2018-06-04 DIAGNOSIS — M19072 Primary osteoarthritis, left ankle and foot: Secondary | ICD-10-CM | POA: Diagnosis not present

## 2018-06-04 DIAGNOSIS — Z96642 Presence of left artificial hip joint: Secondary | ICD-10-CM | POA: Diagnosis not present

## 2018-06-04 DIAGNOSIS — E785 Hyperlipidemia, unspecified: Secondary | ICD-10-CM | POA: Insufficient documentation

## 2018-06-04 DIAGNOSIS — I25110 Atherosclerotic heart disease of native coronary artery with unstable angina pectoris: Secondary | ICD-10-CM | POA: Diagnosis not present

## 2018-06-04 DIAGNOSIS — N183 Chronic kidney disease, stage 3 unspecified: Secondary | ICD-10-CM | POA: Diagnosis not present

## 2018-06-04 DIAGNOSIS — M810 Age-related osteoporosis without current pathological fracture: Secondary | ICD-10-CM | POA: Diagnosis not present

## 2018-06-04 DIAGNOSIS — M9904 Segmental and somatic dysfunction of sacral region: Secondary | ICD-10-CM | POA: Diagnosis not present

## 2018-06-04 DIAGNOSIS — Z8639 Personal history of other endocrine, nutritional and metabolic disease: Secondary | ICD-10-CM | POA: Diagnosis not present

## 2018-06-04 DIAGNOSIS — I10 Essential (primary) hypertension: Secondary | ICD-10-CM | POA: Diagnosis not present

## 2018-06-04 DIAGNOSIS — G8911 Acute pain due to trauma: Secondary | ICD-10-CM | POA: Diagnosis not present

## 2018-06-04 DIAGNOSIS — I4891 Unspecified atrial fibrillation: Secondary | ICD-10-CM | POA: Diagnosis not present

## 2018-06-04 DIAGNOSIS — M19041 Primary osteoarthritis, right hand: Secondary | ICD-10-CM | POA: Diagnosis not present

## 2018-06-04 DIAGNOSIS — J441 Chronic obstructive pulmonary disease with (acute) exacerbation: Secondary | ICD-10-CM | POA: Diagnosis not present

## 2018-06-04 DIAGNOSIS — H0288B Meibomian gland dysfunction left eye, upper and lower eyelids: Secondary | ICD-10-CM | POA: Diagnosis not present

## 2018-06-04 DIAGNOSIS — I5032 Chronic diastolic (congestive) heart failure: Secondary | ICD-10-CM | POA: Diagnosis not present

## 2018-06-04 DIAGNOSIS — I252 Old myocardial infarction: Secondary | ICD-10-CM | POA: Diagnosis not present

## 2018-06-04 DIAGNOSIS — H5203 Hypermetropia, bilateral: Secondary | ICD-10-CM | POA: Diagnosis not present

## 2018-06-04 DIAGNOSIS — Z823 Family history of stroke: Secondary | ICD-10-CM | POA: Insufficient documentation

## 2018-06-04 DIAGNOSIS — I6529 Occlusion and stenosis of unspecified carotid artery: Secondary | ICD-10-CM | POA: Insufficient documentation

## 2018-06-04 DIAGNOSIS — Z7982 Long term (current) use of aspirin: Secondary | ICD-10-CM | POA: Insufficient documentation

## 2018-06-04 DIAGNOSIS — E1122 Type 2 diabetes mellitus with diabetic chronic kidney disease: Secondary | ICD-10-CM | POA: Diagnosis not present

## 2018-06-04 DIAGNOSIS — Z86718 Personal history of other venous thrombosis and embolism: Secondary | ICD-10-CM | POA: Diagnosis not present

## 2018-06-04 DIAGNOSIS — D649 Anemia, unspecified: Secondary | ICD-10-CM | POA: Diagnosis not present

## 2018-06-04 DIAGNOSIS — H0288A Meibomian gland dysfunction right eye, upper and lower eyelids: Secondary | ICD-10-CM | POA: Diagnosis not present

## 2018-06-04 DIAGNOSIS — H524 Presbyopia: Secondary | ICD-10-CM | POA: Diagnosis not present

## 2018-06-04 DIAGNOSIS — S065X9A Traumatic subdural hemorrhage with loss of consciousness of unspecified duration, initial encounter: Secondary | ICD-10-CM | POA: Diagnosis not present

## 2018-06-04 DIAGNOSIS — G301 Alzheimer's disease with late onset: Secondary | ICD-10-CM | POA: Diagnosis not present

## 2018-06-04 DIAGNOSIS — B0229 Other postherpetic nervous system involvement: Secondary | ICD-10-CM | POA: Diagnosis not present

## 2018-06-04 DIAGNOSIS — Z86010 Personal history of colonic polyps: Secondary | ICD-10-CM | POA: Diagnosis not present

## 2018-06-04 DIAGNOSIS — Z7902 Long term (current) use of antithrombotics/antiplatelets: Secondary | ICD-10-CM | POA: Diagnosis not present

## 2018-06-04 DIAGNOSIS — M546 Pain in thoracic spine: Secondary | ICD-10-CM | POA: Diagnosis not present

## 2018-06-04 DIAGNOSIS — Z1159 Encounter for screening for other viral diseases: Secondary | ICD-10-CM | POA: Diagnosis not present

## 2018-06-04 DIAGNOSIS — Z79890 Hormone replacement therapy (postmenopausal): Secondary | ICD-10-CM | POA: Diagnosis not present

## 2018-06-04 DIAGNOSIS — Z5181 Encounter for therapeutic drug level monitoring: Secondary | ICD-10-CM | POA: Diagnosis not present

## 2018-06-04 DIAGNOSIS — Z7901 Long term (current) use of anticoagulants: Secondary | ICD-10-CM | POA: Diagnosis not present

## 2018-06-04 DIAGNOSIS — I70222 Atherosclerosis of native arteries of extremities with rest pain, left leg: Secondary | ICD-10-CM | POA: Diagnosis not present

## 2018-06-04 DIAGNOSIS — S271XXA Traumatic hemothorax, initial encounter: Secondary | ICD-10-CM | POA: Diagnosis not present

## 2018-06-04 DIAGNOSIS — R918 Other nonspecific abnormal finding of lung field: Secondary | ICD-10-CM | POA: Diagnosis not present

## 2018-06-04 DIAGNOSIS — Z974 Presence of external hearing-aid: Secondary | ICD-10-CM | POA: Diagnosis not present

## 2018-06-04 DIAGNOSIS — R260 Ataxic gait: Secondary | ICD-10-CM | POA: Diagnosis not present

## 2018-06-04 DIAGNOSIS — Z888 Allergy status to other drugs, medicaments and biological substances status: Secondary | ICD-10-CM | POA: Diagnosis not present

## 2018-06-04 DIAGNOSIS — Z87891 Personal history of nicotine dependence: Secondary | ICD-10-CM | POA: Insufficient documentation

## 2018-06-04 DIAGNOSIS — J343 Hypertrophy of nasal turbinates: Secondary | ICD-10-CM | POA: Diagnosis not present

## 2018-06-04 DIAGNOSIS — M9903 Segmental and somatic dysfunction of lumbar region: Secondary | ICD-10-CM | POA: Diagnosis not present

## 2018-06-04 DIAGNOSIS — Z955 Presence of coronary angioplasty implant and graft: Secondary | ICD-10-CM | POA: Insufficient documentation

## 2018-06-04 DIAGNOSIS — R351 Nocturia: Secondary | ICD-10-CM | POA: Diagnosis not present

## 2018-06-04 DIAGNOSIS — K219 Gastro-esophageal reflux disease without esophagitis: Secondary | ICD-10-CM | POA: Insufficient documentation

## 2018-06-04 DIAGNOSIS — E038 Other specified hypothyroidism: Secondary | ICD-10-CM | POA: Diagnosis not present

## 2018-06-04 DIAGNOSIS — I2510 Atherosclerotic heart disease of native coronary artery without angina pectoris: Secondary | ICD-10-CM | POA: Insufficient documentation

## 2018-06-04 DIAGNOSIS — T794XXA Traumatic shock, initial encounter: Secondary | ICD-10-CM | POA: Diagnosis not present

## 2018-06-04 DIAGNOSIS — H10823 Rosacea conjunctivitis, bilateral: Secondary | ICD-10-CM | POA: Diagnosis not present

## 2018-06-04 DIAGNOSIS — H40033 Anatomical narrow angle, bilateral: Secondary | ICD-10-CM | POA: Diagnosis not present

## 2018-06-04 DIAGNOSIS — M1A09X0 Idiopathic chronic gout, multiple sites, without tophus (tophi): Secondary | ICD-10-CM | POA: Diagnosis not present

## 2018-06-04 DIAGNOSIS — F329 Major depressive disorder, single episode, unspecified: Secondary | ICD-10-CM | POA: Diagnosis not present

## 2018-06-04 DIAGNOSIS — D62 Acute posthemorrhagic anemia: Secondary | ICD-10-CM | POA: Diagnosis not present

## 2018-06-04 DIAGNOSIS — Z79899 Other long term (current) drug therapy: Secondary | ICD-10-CM | POA: Diagnosis not present

## 2018-06-04 DIAGNOSIS — N179 Acute kidney failure, unspecified: Secondary | ICD-10-CM | POA: Diagnosis not present

## 2018-06-04 DIAGNOSIS — R451 Restlessness and agitation: Secondary | ICD-10-CM | POA: Diagnosis not present

## 2018-06-04 DIAGNOSIS — Z20828 Contact with and (suspected) exposure to other viral communicable diseases: Secondary | ICD-10-CM | POA: Diagnosis not present

## 2018-06-04 DIAGNOSIS — S0231XA Fracture of orbital floor, right side, initial encounter for closed fracture: Secondary | ICD-10-CM | POA: Diagnosis not present

## 2018-06-04 DIAGNOSIS — M5134 Other intervertebral disc degeneration, thoracic region: Secondary | ICD-10-CM | POA: Diagnosis not present

## 2018-06-04 DIAGNOSIS — M9901 Segmental and somatic dysfunction of cervical region: Secondary | ICD-10-CM | POA: Diagnosis not present

## 2018-06-04 DIAGNOSIS — J449 Chronic obstructive pulmonary disease, unspecified: Secondary | ICD-10-CM | POA: Diagnosis not present

## 2018-06-04 DIAGNOSIS — J9600 Acute respiratory failure, unspecified whether with hypoxia or hypercapnia: Secondary | ICD-10-CM | POA: Diagnosis not present

## 2018-06-04 DIAGNOSIS — T84021A Dislocation of internal left hip prosthesis, initial encounter: Secondary | ICD-10-CM | POA: Diagnosis not present

## 2018-06-04 DIAGNOSIS — M9902 Segmental and somatic dysfunction of thoracic region: Secondary | ICD-10-CM | POA: Diagnosis not present

## 2018-06-04 DIAGNOSIS — H903 Sensorineural hearing loss, bilateral: Secondary | ICD-10-CM | POA: Diagnosis not present

## 2018-06-04 DIAGNOSIS — M1711 Unilateral primary osteoarthritis, right knee: Secondary | ICD-10-CM | POA: Diagnosis not present

## 2018-06-04 DIAGNOSIS — M5416 Radiculopathy, lumbar region: Secondary | ICD-10-CM | POA: Diagnosis not present

## 2018-06-04 DIAGNOSIS — M40204 Unspecified kyphosis, thoracic region: Secondary | ICD-10-CM | POA: Diagnosis not present

## 2018-06-04 DIAGNOSIS — F0391 Unspecified dementia with behavioral disturbance: Secondary | ICD-10-CM | POA: Diagnosis not present

## 2018-06-04 DIAGNOSIS — H2632 Drug-induced cataract, left eye: Secondary | ICD-10-CM | POA: Diagnosis not present

## 2018-06-04 DIAGNOSIS — E119 Type 2 diabetes mellitus without complications: Secondary | ICD-10-CM | POA: Diagnosis not present

## 2018-06-04 DIAGNOSIS — M5441 Lumbago with sciatica, right side: Secondary | ICD-10-CM | POA: Diagnosis not present

## 2018-06-04 DIAGNOSIS — I471 Supraventricular tachycardia: Secondary | ICD-10-CM | POA: Diagnosis not present

## 2018-06-04 DIAGNOSIS — M7989 Other specified soft tissue disorders: Secondary | ICD-10-CM | POA: Diagnosis not present

## 2018-06-04 DIAGNOSIS — F419 Anxiety disorder, unspecified: Secondary | ICD-10-CM | POA: Diagnosis not present

## 2018-06-04 DIAGNOSIS — T162XXA Foreign body in left ear, initial encounter: Secondary | ICD-10-CM | POA: Diagnosis not present

## 2018-06-04 DIAGNOSIS — M19071 Primary osteoarthritis, right ankle and foot: Secondary | ICD-10-CM | POA: Diagnosis not present

## 2018-06-04 DIAGNOSIS — S066X9A Traumatic subarachnoid hemorrhage with loss of consciousness of unspecified duration, initial encounter: Secondary | ICD-10-CM | POA: Diagnosis not present

## 2018-06-04 DIAGNOSIS — Z7984 Long term (current) use of oral hypoglycemic drugs: Secondary | ICD-10-CM | POA: Diagnosis not present

## 2018-06-04 DIAGNOSIS — I483 Typical atrial flutter: Secondary | ICD-10-CM | POA: Diagnosis not present

## 2018-06-04 DIAGNOSIS — R197 Diarrhea, unspecified: Secondary | ICD-10-CM | POA: Diagnosis not present

## 2018-06-04 DIAGNOSIS — H2513 Age-related nuclear cataract, bilateral: Secondary | ICD-10-CM | POA: Diagnosis not present

## 2018-06-04 DIAGNOSIS — M542 Cervicalgia: Secondary | ICD-10-CM | POA: Diagnosis not present

## 2018-06-04 DIAGNOSIS — E7800 Pure hypercholesterolemia, unspecified: Secondary | ICD-10-CM | POA: Diagnosis not present

## 2018-06-04 DIAGNOSIS — S2241XA Multiple fractures of ribs, right side, initial encounter for closed fracture: Secondary | ICD-10-CM | POA: Diagnosis not present

## 2018-06-04 DIAGNOSIS — F0281 Dementia in other diseases classified elsewhere with behavioral disturbance: Secondary | ICD-10-CM | POA: Diagnosis not present

## 2018-06-04 DIAGNOSIS — I998 Other disorder of circulatory system: Secondary | ICD-10-CM | POA: Diagnosis not present

## 2018-06-04 DIAGNOSIS — I4892 Unspecified atrial flutter: Secondary | ICD-10-CM | POA: Diagnosis not present

## 2018-06-04 DIAGNOSIS — H25042 Posterior subcapsular polar age-related cataract, left eye: Secondary | ICD-10-CM | POA: Diagnosis not present

## 2018-06-04 DIAGNOSIS — E039 Hypothyroidism, unspecified: Secondary | ICD-10-CM | POA: Diagnosis not present

## 2018-06-04 DIAGNOSIS — M62830 Muscle spasm of back: Secondary | ICD-10-CM | POA: Diagnosis not present

## 2018-06-04 DIAGNOSIS — C61 Malignant neoplasm of prostate: Secondary | ICD-10-CM | POA: Diagnosis not present

## 2018-06-04 DIAGNOSIS — S32039A Unspecified fracture of third lumbar vertebra, initial encounter for closed fracture: Secondary | ICD-10-CM | POA: Diagnosis not present

## 2018-06-04 DIAGNOSIS — M5136 Other intervertebral disc degeneration, lumbar region: Secondary | ICD-10-CM | POA: Diagnosis not present

## 2018-06-04 DIAGNOSIS — I70229 Atherosclerosis of native arteries of extremities with rest pain, unspecified extremity: Secondary | ICD-10-CM | POA: Diagnosis present

## 2018-06-04 DIAGNOSIS — R3129 Other microscopic hematuria: Secondary | ICD-10-CM | POA: Diagnosis not present

## 2018-06-04 DIAGNOSIS — R109 Unspecified abdominal pain: Secondary | ICD-10-CM | POA: Diagnosis not present

## 2018-06-04 HISTORY — PX: LOWER EXTREMITY ANGIOGRAPHY: CATH118251

## 2018-06-04 LAB — CREATININE, SERUM
Creatinine, Ser: 0.82 mg/dL (ref 0.61–1.24)
GFR calc Af Amer: >60 mL/min (ref >60)
GFR calc non Af Amer: >60 mL/min (ref >60)

## 2018-06-04 LAB — BUN: BUN: 29 mg/dL — ABNORMAL HIGH (ref 8–23)

## 2018-06-04 SURGERY — LOWER EXTREMITY ANGIOGRAPHY
Anesthesia: Moderate Sedation | Laterality: Left

## 2018-06-04 MED ORDER — TRAMADOL HCL 50 MG PO TABS: 50.0000 mg | ORAL_TABLET | Freq: Four times a day (QID) | ORAL | Status: DC | PRN

## 2018-06-04 MED ORDER — DIPHENHYDRAMINE HCL 50 MG/ML IJ SOLN: 50.0000 mg | Freq: Once | INTRAMUSCULAR | Status: DC | PRN

## 2018-06-04 MED ORDER — FAMOTIDINE 20 MG PO TABS: 40.0000 mg | ORAL_TABLET | Freq: Once | ORAL | Status: DC | PRN

## 2018-06-04 MED ORDER — TIROFIBAN (AGGRASTAT) BOLUS VIA INFUSION
25.0000 ug/kg | Freq: Once | INTRAVENOUS | Status: AC
  Administered 2018-06-04: 12:00:00 12500 ug via INTRAVENOUS

## 2018-06-04 MED ORDER — MIDAZOLAM HCL 2 MG/2ML IJ SOLN
INTRAMUSCULAR | Status: DC | PRN
  Administered 2018-06-04 (×4): 1 mg via INTRAVENOUS
  Administered 2018-06-04: 2 mg via INTRAVENOUS
  Administered 2018-06-04: 1 mg via INTRAVENOUS

## 2018-06-04 MED ORDER — MIDAZOLAM HCL 2 MG/ML PO SYRP: 8.0000 mg | ORAL_SOLUTION | Freq: Once | ORAL | Status: DC | PRN

## 2018-06-04 MED ORDER — FENTANYL CITRATE (PF) 100 MCG/2ML IJ SOLN
INTRAMUSCULAR | Status: AC
  Filled 2018-06-04: qty 2

## 2018-06-04 MED ORDER — DEXTROSE-NACL 5-0.9 % IV SOLN: INTRAVENOUS | Status: DC

## 2018-06-04 MED ORDER — CEFAZOLIN SODIUM-DEXTROSE 2-4 GM/100ML-% IV SOLN
2.0000 g | Freq: Once | INTRAVENOUS | Status: AC
  Administered 2018-06-04: 11:00:00 2 g via INTRAVENOUS

## 2018-06-04 MED ORDER — LIDOCAINE-EPINEPHRINE (PF) 1 %-1:200000 IJ SOLN
INTRAMUSCULAR | Status: AC
  Filled 2018-06-04: qty 30

## 2018-06-04 MED ORDER — CLOPIDOGREL BISULFATE 75 MG PO TABS
75.0000 mg | ORAL_TABLET | Freq: Every day | ORAL | Status: DC
  Administered 2018-06-05: 75 mg via ORAL
  Filled 2018-06-04: qty 1

## 2018-06-04 MED ORDER — TIROFIBAN HCL IV 12.5 MG/250 ML
INTRAVENOUS | Status: AC
  Administered 2018-06-04: 12500 ug via INTRAVENOUS
  Filled 2018-06-04: qty 250

## 2018-06-04 MED ORDER — CYCLOBENZAPRINE HCL 10 MG PO TABS
10.0000 mg | ORAL_TABLET | Freq: Three times a day (TID) | ORAL | Status: DC | PRN
  Administered 2018-06-04: 20:00:00 10 mg via ORAL
  Filled 2018-06-04: qty 1

## 2018-06-04 MED ORDER — ALTEPLASE 2 MG IJ SOLR
INTRAMUSCULAR | Status: AC
  Filled 2018-06-04: qty 6

## 2018-06-04 MED ORDER — DOCUSATE SODIUM 100 MG PO CAPS
100.0000 mg | ORAL_CAPSULE | Freq: Two times a day (BID) | ORAL | Status: DC
  Administered 2018-06-04 – 2018-06-05 (×2): 100 mg via ORAL
  Filled 2018-06-04 (×2): qty 1

## 2018-06-04 MED ORDER — LISINOPRIL 10 MG PO TABS
20.0000 mg | ORAL_TABLET | Freq: Every day | ORAL | Status: DC
  Administered 2018-06-05: 20 mg via ORAL
  Filled 2018-06-04: qty 2

## 2018-06-04 MED ORDER — HEPARIN SODIUM (PORCINE) 1000 UNIT/ML IJ SOLN
INTRAMUSCULAR | Status: DC | PRN
  Administered 2018-06-04: 5000 [IU] via INTRAVENOUS

## 2018-06-04 MED ORDER — IOHEXOL 300 MG/ML  SOLN
INTRAMUSCULAR | Status: DC | PRN
  Administered 2018-06-04: 80 mL via INTRA_ARTERIAL

## 2018-06-04 MED ORDER — METOPROLOL SUCCINATE ER 50 MG PO TB24
25.0000 mg | ORAL_TABLET | Freq: Every day | ORAL | Status: DC
  Administered 2018-06-05: 25 mg via ORAL
  Filled 2018-06-04: qty 1

## 2018-06-04 MED ORDER — ONDANSETRON HCL 4 MG/2ML IJ SOLN
4.0000 mg | Freq: Four times a day (QID) | INTRAMUSCULAR | Status: DC | PRN
  Filled 2018-06-04: qty 2

## 2018-06-04 MED ORDER — MIDAZOLAM HCL 5 MG/5ML IJ SOLN
INTRAMUSCULAR | Status: AC
  Filled 2018-06-04: qty 5

## 2018-06-04 MED ORDER — FENTANYL CITRATE (PF) 100 MCG/2ML IJ SOLN
INTRAMUSCULAR | Status: DC | PRN
  Administered 2018-06-04 (×3): 25 ug via INTRAVENOUS
  Administered 2018-06-04: 50 ug via INTRAVENOUS
  Administered 2018-06-04: 25 ug via INTRAVENOUS

## 2018-06-04 MED ORDER — ACETAMINOPHEN 650 MG RE SUPP: 650.0000 mg | Freq: Four times a day (QID) | RECTAL | Status: DC | PRN

## 2018-06-04 MED ORDER — HYDROCODONE-ACETAMINOPHEN 5-325 MG PO TABS
1.0000 | ORAL_TABLET | ORAL | Status: DC | PRN
  Administered 2018-06-04: 2 via ORAL
  Filled 2018-06-04: qty 2

## 2018-06-04 MED ORDER — SODIUM CHLORIDE 0.9 % IV BOLUS
1000.0000 mL | Freq: Once | INTRAVENOUS | Status: AC
  Administered 2018-06-04: 1000 mL via INTRAVENOUS

## 2018-06-04 MED ORDER — HYDROMORPHONE HCL 1 MG/ML IJ SOLN
1.0000 mg | Freq: Once | INTRAMUSCULAR | Status: AC | PRN
  Administered 2018-06-04: 1 mg via INTRAVENOUS
  Filled 2018-06-04: qty 1

## 2018-06-04 MED ORDER — ASPIRIN EC 81 MG PO TBEC: 81.0000 mg | DELAYED_RELEASE_TABLET | Freq: Every day | ORAL | Status: DC

## 2018-06-04 MED ORDER — SORBITOL 70 % SOLN: 30.0000 mL | Freq: Every day | Status: DC | PRN

## 2018-06-04 MED ORDER — SODIUM CHLORIDE 0.9 % IV SOLN
INTRAVENOUS | Status: DC
  Administered 2018-06-04: 1000 mL via INTRAVENOUS

## 2018-06-04 MED ORDER — HEPARIN SODIUM (PORCINE) 1000 UNIT/ML IJ SOLN
INTRAMUSCULAR | Status: AC
  Filled 2018-06-04: qty 1

## 2018-06-04 MED ORDER — ACETAMINOPHEN 325 MG PO TABS: 650.0000 mg | ORAL_TABLET | Freq: Four times a day (QID) | ORAL | Status: DC | PRN

## 2018-06-04 MED ORDER — METHYLPREDNISOLONE SODIUM SUCC 125 MG IJ SOLR: 125.0000 mg | Freq: Once | INTRAMUSCULAR | Status: DC | PRN

## 2018-06-04 MED ORDER — PANTOPRAZOLE SODIUM 40 MG PO TBEC
40.0000 mg | DELAYED_RELEASE_TABLET | Freq: Every day | ORAL | Status: DC
  Administered 2018-06-05: 09:00:00 40 mg via ORAL
  Filled 2018-06-04: qty 1

## 2018-06-04 MED ORDER — TIROFIBAN HCL IN NACL 5-0.9 MG/100ML-% IV SOLN
0.1500 ug/kg/min | INTRAVENOUS | Status: DC
  Administered 2018-06-05 (×2): 0.15 ug/kg/min via INTRAVENOUS
  Filled 2018-06-04 (×7): qty 100

## 2018-06-04 MED ORDER — ALTEPLASE 2 MG IJ SOLR
INTRAMUSCULAR | Status: DC | PRN
  Administered 2018-06-04: 6 mg

## 2018-06-04 MED ORDER — MAGNESIUM HYDROXIDE 400 MG/5ML PO SUSP: 30.0000 mL | Freq: Every day | ORAL | Status: DC | PRN

## 2018-06-04 MED ORDER — ONDANSETRON HCL 4 MG/2ML IJ SOLN
4.0000 mg | Freq: Four times a day (QID) | INTRAMUSCULAR | Status: DC | PRN
  Administered 2018-06-04: 22:00:00 4 mg via INTRAVENOUS

## 2018-06-04 MED ORDER — NITROGLYCERIN 0.4 MG SL SUBL: 0.4000 mg | SUBLINGUAL_TABLET | SUBLINGUAL | Status: DC | PRN

## 2018-06-04 MED ORDER — AMLODIPINE BESYLATE 5 MG PO TABS
5.0000 mg | ORAL_TABLET | Freq: Every day | ORAL | Status: DC
  Administered 2018-06-05: 5 mg via ORAL
  Filled 2018-06-04: qty 1

## 2018-06-04 MED ORDER — HEPARIN (PORCINE) IN NACL 1000-0.9 UT/500ML-% IV SOLN
INTRAVENOUS | Status: AC
  Filled 2018-06-04: qty 1000

## 2018-06-04 MED ORDER — FLEET ENEMA 7-19 GM/118ML RE ENEM: 1.0000 | ENEMA | Freq: Once | RECTAL | Status: DC | PRN

## 2018-06-04 MED ORDER — ONDANSETRON HCL 4 MG PO TABS: 4.0000 mg | ORAL_TABLET | Freq: Four times a day (QID) | ORAL | Status: DC | PRN

## 2018-06-04 MED ORDER — ASPIRIN EC 81 MG PO TBEC
81.0000 mg | DELAYED_RELEASE_TABLET | Freq: Every day | ORAL | Status: DC
  Administered 2018-06-05: 81 mg via ORAL
  Filled 2018-06-04: qty 1

## 2018-06-04 SURGICAL SUPPLY — 19 items
BALLN LUTONIX 018 4X80X130 (BALLOONS) ×2
BALLN LUTONIX 5X150X130 (BALLOONS) ×2
BALLOON LUTONIX 018 4X80X130 (BALLOONS) IMPLANT
BALLOON LUTONIX 5X150X130 (BALLOONS) IMPLANT
CANISTER PENUMBRA ENGINE (MISCELLANEOUS) ×1 IMPLANT
CATH BEACON 5 .038 100 VERT TP (CATHETERS) ×1 IMPLANT
CATH INDIGO CAT6 KIT (CATHETERS) ×1 IMPLANT
CATH PIG 70CM (CATHETERS) ×1 IMPLANT
DEVICE PRESTO INFLATION (MISCELLANEOUS) ×1 IMPLANT
DEVICE SAFEGUARD 24CM (GAUZE/BANDAGES/DRESSINGS) ×1 IMPLANT
DEVICE STARCLOSE SE CLOSURE (Vascular Products) ×1 IMPLANT
GLIDEWIRE ADV .035X260CM (WIRE) ×1 IMPLANT
PACK ANGIOGRAPHY (CUSTOM PROCEDURE TRAY) ×2 IMPLANT
SHEATH BRITE TIP 5FRX11 (SHEATH) ×1 IMPLANT
SHEATH PINNACLE ST 6F 45CM (SHEATH) ×1 IMPLANT
STENT VIABAHN 6X150X120 (Permanent Stent) ×1 IMPLANT
TUBING CONTRAST HIGH PRESS 72 (TUBING) ×1 IMPLANT
WIRE G V18X300CM (WIRE) ×1 IMPLANT
WIRE J 3MM .035X145CM (WIRE) ×1 IMPLANT

## 2018-06-04 NOTE — Op Note (Signed)
[facility]  Percutaneous Study/Intervention Procedural Note   Date of Surgery: 06/04/2018  Surgeon(s):DEW,JASON    Assistants:none  Pre-operative Diagnosis: PAD with rest [name] left lower extremity with acute on chronic ischemia  Post-operative diagnosis:  Same  Procedure(s) Performed:             1.  Ultrasound guidance for vascular access right femoral artery             2.  Catheter placement into left SFA from right femoral approach             3.  Aortogram and selective left lower extremity angiogram             4.   Catheter directed thrombolytic therapy with 6 mg of TPA to the left SFA and popliteal artery             5.   Mechanical thrombectomy with the penumbra cat 6 device to the left SFA, popliteal artery, anterior tibial artery, tibioperoneal trunk, and proximal posterior tibial arteries  6.  Percutaneous transluminal angioplasty of the tibioperoneal trunk and proximal posterior tibial artery with 4 mm diameter by 8 cm length Lutonix drug-coated angioplasty balloon  7.  Viabahn covered stent placement to the left SFA and popliteal arteries with 6 mm diameter by 15 cm length             8.  StarClose closure device right femoral artery  EBL: 250 cc  Contrast: 80 cc  Fluoro Time: 11.1 minutes  Moderate Conscious Sedation Time: approximately 70 minutes using 7 mg of Versed and 150 Mcg of Fentanyl              Indications:  Patient is a 68 y.o.male with acute on chronic ischemic symptoms of the left leg now with rest [name]. The patient has noninvasive study showing occlusion of his previous left lower extremity intervention with markedly reduced flow. The patient is brought in for angiography for further evaluation and potential treatment.  Due to the limb threatening nature of the situation, angiogram was performed for attempted limb salvage. The patient is aware that if the procedure fails, amputation would be expected.  The patient also understands  that even with successful revascularization, amputation may still be required due to the severity of the situation.  Risks and benefits are discussed and informed consent is obtained.   Procedure:  The patient was identified and appropriate procedural time out was performed.  The patient was then placed supine on the table and prepped and draped in the usual sterile fashion. Moderate conscious sedation was administered during a face to face encounter with the patient throughout the procedure with my supervision of the RN administering medicines and monitoring the patient's vital signs, pulse oximetry, telemetry and mental status throughout from the start of the procedure until the patient was taken to the recovery room. Ultrasound was used to evaluate the right common femoral artery.  It was patent .  A digital ultrasound image was acquired.  A Seldinger needle was used to access the right common femoral artery under direct ultrasound guidance and a permanent image was performed.  A 0.035 J wire was advanced without resistance and a 5Fr sheath was placed.  Pigtail catheter was placed into the aorta and an AP aortogram was performed. This demonstrated normal renal arteries and normal aorta and iliac segments without significant stenosis. I then crossed the aortic bifurcation and advanced to the left femoral head and then  into the left proximal superficial femoral artery to help opacify distally. Selective left lower extremity angiogram was then performed. This demonstrated calcific disease in the common femoral artery without focal stenosis.  The profunda femoris artery had a greater than 50% calcific stenosis.  The SFA was patent in the proximal and mid segment without focal stenosis but occluded in the distal SFA just above the previously placed stent.  There was some reconstitution of the anterior tibial and posterior tibial arteries after proximal occlusion of both vessels. It was felt that it was in the  patient's best interest to proceed with intervention after these images to avoid a second procedure and a larger amount of contrast and fluoroscopy based off of the findings from the initial angiogram. The patient was systemically heparinized and a 6 [place] [employer] sheath was then placed over the [employer] wire. I then used a Kumpe catheter and the advantage wire to navigate through the occlusion without difficulty.  While I was in the occlusion, I gave 6 mg of TPA through the Kumpe catheter in the left distal SFA and popliteal arteries.  A wire and catheter went down into the anterior tibial artery where selective imaging showed sluggish but inline flow to the foot.  I then placed a 0.018 wire.  The TPA was allowed to dwell for about 15 minutes.  The penumbra cat 6 device was then brought onto the field and mechanical thrombectomy was performed.  This started in the distal SFA and went through the entire popliteal artery and then into the proximal anterior tibial artery.  Several passes were made with several chunks of thrombus removed.  I then redirected the catheter down the tibioperoneal trunk and posterior tibial artery and made passes with the penumbra cat 6 device to perform mechanical thrombectomy on these vessels as well.  Completion imaging showed thrombus and stenosis in the tibioperoneal trunk and proximal posterior tibial artery that created a greater than 70% stenosis.  There is also thrombus in the mid stent and a nearly occlusive stenosis with thrombus at the leading edge of the stent proximally.  A 4 mm diameter by 8 cm length Lutonix drug-coated angioplasty balloon was then inflated in the proximal posterior tibial artery and tibioperoneal trunk and inflated to 14 atm for 1 minute.  A 6 mm diameter by 15 cm length Viabahn stent was then deployed from the distal popliteal artery up through the distal SFA to encompass the thrombus within the previous stent as well as the stenosis and  thrombus at the leading edge of the previously placed stent.  This was postdilated with a 5 mm diameter by 15 cm length Lutonix drug-coated angioplasty balloon.  Completion imaging demonstrated less than 10% residual stenosis in the SFA or popliteal arteries.  There was about a 30% residual stenosis in the tibioperoneal trunk and proximal posterior tibial artery but the flow was markedly improved.  The anterior tibial artery was also patent proximally with less than 30% residual stenosis although it was more sluggish distally.  At this point, I we had done all we can do to improve his perfusion endovascularly and was pleased with his residual two-vessel runoff.  I elected to terminate the procedure. The sheath was removed and StarClose closure device was deployed in the right femoral artery with excellent hemostatic result. The patient was taken to the recovery room in stable condition having tolerated the procedure well.  Findings:  Aortogram:  Renal arteries appeared widely patent.  The aorta and iliac arteries are patent including the previously placed right iliac stent which was widely patent             Left lower Extremity:  Calcific disease in the common femoral artery without focal stenosis.  The profunda femoris artery had a greater than 50% calcific stenosis.  The SFA was patent in the proximal and mid segment without focal stenosis but occluded in the distal SFA just above the previously placed stent.  There was some reconstitution of the anterior tibial and posterior tibial arteries after proximal occlusion of both vessels.   Disposition: Patient was taken to the recovery room in stable condition having tolerated the procedure well.  Complications: None  Ronald [name] 06/04/2018 12:07 PM   This note was created with Dragon Medical transcription system. Any errors in dictation are purely unintentional.

## 2018-06-04 NOTE — Progress Notes (Signed)
Patient brought to floor by procedures, report received from Genelle Bal, RN

## 2018-06-04 NOTE — H&P (Signed)
[facility] [facility] History & Physical Update  The patient was interviewed and re-examined.  The patient's previous History and Physical has been reviewed and is unchanged.  There is no change in the plan of care. We plan to proceed with the scheduled procedure.  [name], MD  06/04/2018, 10:10 AM

## 2018-06-04 NOTE — Plan of Care (Signed)
  Problem: Education: Goal: Knowledge of General Education information will improve Description: Including pain rating scale, medication(s)/side effects and non-pharmacologic comfort measures Outcome: Progressing   Problem: Health Behavior/Discharge Planning: Goal: Ability to manage health-related needs will improve Outcome: Progressing   Problem: Clinical Measurements: Goal: Ability to maintain clinical measurements within normal limits will improve Outcome: Progressing Goal: Will remain free from infection Outcome: Progressing Goal: Diagnostic test results will improve Outcome: Progressing   Problem: Activity: Goal: Risk for activity intolerance will decrease Outcome: Progressing   Problem: Nutrition: Goal: Adequate nutrition will be maintained Outcome: Progressing   Problem: Pain Managment: Goal: General experience of comfort will improve Outcome: Progressing   Problem: Safety: Goal: Ability to remain free from injury will improve Outcome: Progressing   Problem: Skin Integrity: Goal: Risk for impaired skin integrity will decrease Outcome: Progressing   

## 2018-06-05 ENCOUNTER — Encounter: Payer: Self-pay | Admitting: Vascular Surgery

## 2018-06-05 DIAGNOSIS — I998 Other disorder of circulatory system: Secondary | ICD-10-CM

## 2018-06-05 DIAGNOSIS — Z95820 Peripheral vascular angioplasty status with implants and grafts: Secondary | ICD-10-CM | POA: Diagnosis not present

## 2018-06-05 DIAGNOSIS — I70222 Atherosclerosis of native arteries of extremities with rest pain, left leg: Secondary | ICD-10-CM | POA: Diagnosis not present

## 2018-06-05 LAB — BASIC METABOLIC PANEL
Anion gap: 9 (ref 5–15)
BUN: 28 mg/dL — ABNORMAL HIGH (ref 8–23)
CO2: 29 mmol/L (ref 22–32)
Calcium: 8.4 mg/dL — ABNORMAL LOW (ref 8.9–10.3)
Chloride: 99 mmol/L (ref 98–111)
Creatinine, Ser: 0.97 mg/dL (ref 0.61–1.24)
GFR calc Af Amer: >60 mL/min (ref >60)
GFR calc non Af Amer: >60 mL/min (ref >60)
Glucose, Bld: 111 mg/dL — ABNORMAL HIGH (ref 70–99)
Potassium: 4.6 mmol/L (ref 3.5–5.1)
Sodium: 137 mmol/L (ref 135–145)

## 2018-06-05 LAB — CBC
HCT: 37.2 % — ABNORMAL LOW (ref 39.0–52.0)
Hemoglobin: 12.1 g/dL — ABNORMAL LOW (ref 13.0–17.0)
MCH: 31.1 pg (ref 26.0–34.0)
MCHC: 32.5 g/dL (ref 30.0–36.0)
MCV: 95.6 fL (ref 80.0–100.0)
Platelets: 182 10*3/uL (ref 150–400)
RBC: 3.89 MIL/uL — ABNORMAL LOW (ref 4.22–5.81)
RDW: 13.1 % (ref 11.5–15.5)
WBC: 5.4 10*3/uL (ref 4.0–10.5)
nRBC: 0 % (ref 0.0–0.2)

## 2018-06-05 MED ORDER — HYDROCODONE-ACETAMINOPHEN 5-325 MG PO TABS: 1.0000 | ORAL_TABLET | Freq: Four times a day (QID) | ORAL | 0 refills | Status: DC | PRN

## 2018-06-05 MED ORDER — CLOPIDOGREL BISULFATE 75 MG PO TABS: 75.0000 mg | ORAL_TABLET | Freq: Every day | ORAL | 3 refills | Status: DC

## 2018-06-05 MED ORDER — ASPIRIN EC 81 MG PO TBEC: 81.0000 mg | DELAYED_RELEASE_TABLET | Freq: Every day | ORAL | 3 refills | Status: DC

## 2018-06-05 NOTE — Progress Notes (Signed)
Pt tobe discharged this morning. Iv and tele removed. disch instructions and prescrips given to him. disch via w.c. to be met by wife.

## 2018-06-05 NOTE — Discharge Instructions (Signed)
You may shower as of tomorrow.  Keep groins clean and dry. No driving while on pain medication. DO not take your tramadol or vicodin at the same time Tramadol = moderate pain Vicodin = severe pain

## 2018-06-05 NOTE — Discharge Summary (Signed)
[facility]    Discharge Summary  Patient ID:  [name] MRN: [id number] DOB/AGE: August 13, 1950 68 y.o.  Admit date: 06/04/2018 Discharge date: 06/05/2018 Date of Surgery: 06/04/2018 Surgeon: Surgeon(s): Algernon Huxley, MD  Admission Diagnosis: LLE Angiography   ASO with rest pain  Discharge Diagnoses:  LLE Angiography   ASO with rest pain  Secondary Diagnoses: Past Medical History:  Diagnosis Date  . Abnormal nuclear cardiac imaging test 08/08/2015  . Arthritis    fingers  . Carotid artery occlusion   . Coronary atherosclerosis of native coronary artery 01/29/2013   stent  . Duodenal erosion   . Encounter for screening for lung cancer 07/13/2016  . Esophageal stenosis    esophageal dilation  . GERD (gastroesophageal reflux disease)   . H. pylori infection   . Heart attack [facility]) Oct. 2009   Mild  . Hiatal hernia   . Hyperlipidemia   . Hypertension   . 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 Cardiac catheterization-nonobstructive 60% RCA disease-subsequent nuclear stress test-9 minutes, low risk, mild inferior wall hypokinesis   . Pain in limb 12/19/2017  . Peripheral vascular disease ([facility])   . Unstable angina ([place]) 11/25/2017   Procedure(s): LOWER EXTREMITY ANGIOGRAPHY  Discharged Condition: good  HPI:  The patient is a 68 year old male with multiple medical issues including severe left lower extremity PAD who presented with acute on chronic left lower extremity ischemia. On 06/04/18, the patient underwent a:  1. Ultrasound guidance for vascular access right femoral artery 2. Catheter placement into left SFA from right femoral approach 3. Aortogram and selective left lower extremity angiogram 4.  Catheter directed thrombolytic therapy with 6 mg of TPA to the left SFA and popliteal artery 5.  Mechanical thrombectomy with the  penumbra cat 6 device to the left SFA, popliteal artery, anterior tibial artery, tibioperoneal trunk, and proximal posterior tibial arteries             6.  Percutaneous transluminal angioplasty of the tibioperoneal trunk and proximal posterior tibial artery with 4 mm diameter by 8 cm length Lutonix drug-coated angioplasty balloon             7.  Viabahn covered stent placement to the left SFA and popliteal arteries with 6 mm diameter by 15 cm length 8. StarClose closure device right femoral artery  Hospital Course:  [name] is a 68 y.o. male is S/P Left:  Procedure(s):  LOWER EXTREMITY ANGIOGRAPHY  Extubated: POD # 0  Physical exam:  A&Ox3, NAD CV: RRR Pulm: CTA Bilaterally Abdomen: Soft, Non-tender, Non-distended, (+) Bowel Sounds Right Groin: Access point - no drainage / swelling noted Left Lower Extremity: Thigh soft, calf soft. Extremity warm distally to toes. Motor / sensory intact.  Post-op wounds clean, dry, intact or healing well  Pt. Ambulating, voiding and taking PO diet without difficulty.  Pt pain controlled with PO pain meds.  Labs as below  Complications: None  Consults: None  Significant Diagnostic Studies: CBC Lab Results  Component Value Date   WBC 5.4 06/05/2018   HGB 12.1 (L) 06/05/2018   HCT 37.2 (L) 06/05/2018   MCV 95.6 06/05/2018   PLT 182 06/05/2018   BMET    Component Value Date/Time   NA 137 06/05/2018 0305   NA 138 05/17/2018 0900   K 4.6 06/05/2018 0305   CL 99 06/05/2018 0305   CO2 29 06/05/2018 0305   GLUCOSE  111 (H) 06/05/2018 0305   BUN 28 (H) 06/05/2018 0305   BUN 33 (H) 05/17/2018 0900   CREATININE 0.97 06/05/2018 0305   CREATININE 1.12 07/23/2015 1016   CALCIUM 8.4 (L) 06/05/2018 0305   GFRNONAA >60 06/05/2018 0305   GFRAA >60 06/05/2018 0305   COAG Lab Results  Component Value Date   INR 1.00 11/24/2017   INR 0.91 07/23/2015   INR 1.0 12/05/2007   Disposition:  Discharge to :Home  Allergies  as of 06/05/2018      Reactions   Brilinta [ticagrelor]    Shortness of breath   Zetia [ezetimibe] Other (See Comments)   Muscle aches   Statins Other (See Comments)   Failed Crestor 5 mg twice weekly, Crestor 20 mg daily, Pravastatin 40 mg qd, Lipitor, Zocor - muscle aches      Medication List    TAKE these medications   AMBULATORY NON FORMULARY MEDICATION Take 180 mg by mouth daily. Medication Name: bempedoic acid vs placebo. CLEAR Research study drug provided   amLODipine 5 MG tablet Commonly known as:  NORVASC Take 1 tablet (5 mg total) by mouth daily.   aspirin EC 81 MG tablet Take 1 tablet (81 mg total) by mouth daily.   clopidogrel 75 MG tablet Commonly known as:  Plavix Take 1 tablet (75 mg total) by mouth daily.   cyclobenzaprine 10 MG tablet Commonly known as:  FLEXERIL Take 1 tablet (10 mg total) by mouth 3 (three) times daily as needed for muscle spasms.   HYDROcodone-acetaminophen 5-325 MG tablet Commonly known as:  NORCO/VICODIN Take 1 tablet by mouth every 6 (six) hours as needed for severe pain.   lisinopril 20 MG tablet Commonly known as:  PRINIVIL,ZESTRIL Take 1 tablet (20 mg total) by mouth daily.   meloxicam 7.5 MG tablet Commonly known as:  MOBIC Take 1 tablet (7.5 mg total) by mouth daily. What changed:    when to take this  reasons to take this   metoprolol succinate 25 MG 24 hr tablet Commonly known as:  TOPROL-XL Take 1 tablet (25 mg total) by mouth daily.   nitroGLYCERIN 0.4 MG SL tablet Commonly known as:  Nitrostat Place 1 tablet (0.4 mg total) under the tongue every 5 (five) minutes as needed for chest pain.   pantoprazole 40 MG tablet Commonly known as:  PROTONIX TAKE 1 TABLET BY MOUTH EVERY DAY   traMADol 50 MG tablet Commonly known as:  ULTRAM Take 1 tablet (50 mg total) by mouth every 6 (six) hours as needed for moderate pain.      Verbal and written Discharge instructions given to the patient. Wound care per Discharge  AVS Follow-up Information    Dew, Erskine Squibb, MD In 1 month.   Specialties:  Vascular Surgery, Radiology, Interventional Cardiology Why:  Can see Dew or [facility]. Will need ABI with visit.  Contact information: [place] [address] [phone]        Please follow up.   Why:  patient needs to call to schedule appointment          Signed: Sela Hua, PA-C  06/05/2018, 11:40 AM

## 2018-06-06 ENCOUNTER — Telehealth: Payer: Self-pay | Admitting: *Deleted

## 2018-06-06 NOTE — Telephone Encounter (Signed)
Called subject to cancel next weeks appointment. Had a concomitant procedure and reported to sponsor on 06/05/18, stent to the SFA, LLE.  Confirmed address for drug delivery via [employer].

## 2018-06-11 ENCOUNTER — Encounter: Payer: Self-pay | Admitting: Vascular Surgery

## 2018-06-12 ENCOUNTER — Telehealth: Payer: Self-pay | Admitting: *Deleted

## 2018-06-12 NOTE — Telephone Encounter (Signed)
Phone visit for T12-M30 in the Clear research trial.  Con procedure reported to sponsor.  Subject feeling well without cos.  New medication dispensed and shipped via [employer] due to no clinic visits at this time. Next follow up phone visit scheduled.

## 2018-06-14 ENCOUNTER — Other Ambulatory Visit: Payer: Self-pay | Admitting: Internal Medicine

## 2018-06-14 DIAGNOSIS — M5134 Other intervertebral disc degeneration, thoracic region: Secondary | ICD-10-CM

## 2018-06-20 ENCOUNTER — Other Ambulatory Visit: Payer: Self-pay

## 2018-06-20 ENCOUNTER — Telehealth: Payer: Self-pay | Admitting: Gastroenterology

## 2018-06-20 DIAGNOSIS — Z1211 Encounter for screening for malignant neoplasm of colon: Secondary | ICD-10-CM

## 2018-06-20 NOTE — Telephone Encounter (Signed)
Gastroenterology Pre-Procedure Review  Request Date: Raynelle Dick 09/06/2018   [place] Requesting Physician: Dr. Allen Norris  PATIENT REVIEW QUESTIONS: The patient responded to the following health history questions as indicated:    1. Are you having any GI issues? no 2. Do you have a personal history of Polyps? no 3. Do you have a family history of Colon Cancer or Polyps? no 4. Diabetes Mellitus? no 5. Joint replacements in the past 12 months?no 6. Major health problems in the past 3 months?yes (Stents in both legs 06/04/2018)  7. Any artificial heart valves, MVP, or defibrillator?no    MEDICATIONS & ALLERGIES:    Patient reports the following regarding taking any anticoagulation/antiplatelet therapy:   Plavix, Coumadin, Eliquis, Xarelto, Lovenox, Pradaxa, Brilinta, or Effient? yes (Plavix) Aspirin? yes (81 mg)  Patient confirms/reports the following medications:  Current Outpatient Medications  Medication Sig Dispense Refill  . AMBULATORY NON FORMULARY MEDICATION Take 180 mg by mouth daily. Medication Name: bempedoic acid vs placebo. CLEAR Research study drug provided    . amLODipine (NORVASC) 5 MG tablet Take 1 tablet (5 mg total) by mouth daily. 90 tablet 3  . aspirin EC 81 MG tablet Take 1 tablet (81 mg total) by mouth daily. 90 tablet 3  . clopidogrel (PLAVIX) 75 MG tablet Take 1 tablet (75 mg total) by mouth daily. 90 tablet 3  . cyclobenzaprine (FLEXERIL) 10 MG tablet Take 1 tablet (10 mg total) by mouth 3 (three) times daily as needed for muscle spasms. 60 tablet 5  . HYDROcodone-acetaminophen (NORCO/VICODIN) 5-325 MG tablet Take 1 tablet by mouth every 6 (six) hours as needed for severe pain. 28 tablet 0  . lisinopril (PRINIVIL,ZESTRIL) 20 MG tablet Take 1 tablet (20 mg total) by mouth daily. 90 tablet 2  . meloxicam (MOBIC) 7.5 MG tablet Take 1 tablet (7.5 mg total) by mouth daily. (Patient taking differently: Take 7.5 mg by mouth as needed. ) 90 tablet 0  . metoprolol succinate (TOPROL-XL)  25 MG 24 hr tablet Take 1 tablet (25 mg total) by mouth daily. 90 tablet 3  . nitroGLYCERIN (NITROSTAT) 0.4 MG SL tablet Place 1 tablet (0.4 mg total) under the tongue every 5 (five) minutes as needed for chest pain. 25 tablet 2  . pantoprazole (PROTONIX) 40 MG tablet TAKE 1 TABLET BY MOUTH EVERY DAY 90 tablet 0  . traMADol (ULTRAM) 50 MG tablet Take 1 tablet (50 mg total) by mouth every 6 (six) hours as needed for moderate pain. 30 tablet 1   No current facility-administered medications for this visit.     Patient confirms/reports the following allergies:  Allergies  Allergen Reactions  . Brilinta [Ticagrelor]     Shortness of breath  . Zetia [Ezetimibe] Other (See Comments)    Muscle aches  . Statins Other (See Comments)    Failed Crestor 5 mg twice weekly, Crestor 20 mg daily, Pravastatin 40 mg qd, Lipitor, Zocor - muscle aches    No orders of the defined types were placed in this encounter.   AUTHORIZATION INFORMATION Primary Insurance: 1D#: Group #:  Secondary Insurance: 1D#: Group #:  SCHEDULE INFORMATION: Date:  Time: Location:

## 2018-06-20 NOTE — Telephone Encounter (Signed)
Patient LVM that his cardiologist is Dr. Saunders Revel w/ [facility] and Dr. Lucky Cowboy is his vascular doctor.

## 2018-06-22 ENCOUNTER — Other Ambulatory Visit: Payer: Self-pay | Admitting: Internal Medicine

## 2018-06-22 DIAGNOSIS — M5134 Other intervertebral disc degeneration, thoracic region: Secondary | ICD-10-CM

## 2018-07-02 ENCOUNTER — Ambulatory Visit: Payer: PPO

## 2018-07-16 ENCOUNTER — Other Ambulatory Visit (INDEPENDENT_AMBULATORY_CARE_PROVIDER_SITE_OTHER): Payer: Self-pay | Admitting: Vascular Surgery

## 2018-07-16 DIAGNOSIS — Z95820 Peripheral vascular angioplasty status with implants and grafts: Secondary | ICD-10-CM

## 2018-07-16 DIAGNOSIS — I70222 Atherosclerosis of native arteries of extremities with rest pain, left leg: Secondary | ICD-10-CM

## 2018-07-17 ENCOUNTER — Other Ambulatory Visit: Payer: Self-pay

## 2018-07-17 ENCOUNTER — Ambulatory Visit (INDEPENDENT_AMBULATORY_CARE_PROVIDER_SITE_OTHER): Payer: PPO | Admitting: Vascular Surgery

## 2018-07-17 ENCOUNTER — Ambulatory Visit (INDEPENDENT_AMBULATORY_CARE_PROVIDER_SITE_OTHER): Payer: PPO

## 2018-07-17 ENCOUNTER — Encounter (INDEPENDENT_AMBULATORY_CARE_PROVIDER_SITE_OTHER): Payer: Self-pay | Admitting: Vascular Surgery

## 2018-07-17 VITALS — BP 107/73 | HR 61 | Resp 10 | Ht 66.0 in | Wt 173.0 lb

## 2018-07-17 DIAGNOSIS — F17290 Nicotine dependence, other tobacco product, uncomplicated: Secondary | ICD-10-CM | POA: Diagnosis not present

## 2018-07-17 DIAGNOSIS — Z95820 Peripheral vascular angioplasty status with implants and grafts: Secondary | ICD-10-CM

## 2018-07-17 DIAGNOSIS — Z79899 Other long term (current) drug therapy: Secondary | ICD-10-CM | POA: Diagnosis not present

## 2018-07-17 DIAGNOSIS — I10 Essential (primary) hypertension: Secondary | ICD-10-CM | POA: Diagnosis not present

## 2018-07-17 DIAGNOSIS — I6523 Occlusion and stenosis of bilateral carotid arteries: Secondary | ICD-10-CM | POA: Diagnosis not present

## 2018-07-17 DIAGNOSIS — I70222 Atherosclerosis of native arteries of extremities with rest pain, left leg: Secondary | ICD-10-CM | POA: Diagnosis not present

## 2018-07-17 DIAGNOSIS — E785 Hyperlipidemia, unspecified: Secondary | ICD-10-CM

## 2018-07-17 DIAGNOSIS — I6529 Occlusion and stenosis of unspecified carotid artery: Secondary | ICD-10-CM | POA: Insufficient documentation

## 2018-07-17 DIAGNOSIS — I739 Peripheral vascular disease, unspecified: Secondary | ICD-10-CM

## 2018-07-17 NOTE — Progress Notes (Signed)
MRN : [id number]  [name] is a 68 y.o. (1950-04-10) male who presents with chief complaint of  Chief Complaint  Patient presents with  . Follow-up  .  History of Present Illness: Patient returns today in follow up of his PAD and carotid disease.  About a month ago, he underwent repeat revascularization of the left lower extremity for recurrent ischemic symptoms.  He has done well.  He did have some access site irritation that took a couple of weeks to heal up.  He had very little swelling.  His ABIs today are 1.2 bilaterally with brisk waveforms.  His claudication symptoms have basically resolved. We are also checking his carotids today.  He is status post right carotid endarterectomy many years ago.  He has no current focal neurologic symptoms. Specifically, the patient denies amaurosis fugax, speech or swallowing difficulties, or arm or leg weakness or numbness.  Duplex today shows a patent right carotid endarterectomy and stable stenosis in the 40 to 59% range on the left.  Current Outpatient Medications  Medication Sig Dispense Refill  . AMBULATORY NON FORMULARY MEDICATION Take 180 mg by mouth daily. Medication Name: bempedoic acid vs placebo. CLEAR Research study drug provided    . amLODipine (NORVASC) 5 MG tablet Take 1 tablet (5 mg total) by mouth daily. 90 tablet 3  . aspirin EC 81 MG tablet Take 1 tablet (81 mg total) by mouth daily. 90 tablet 3  . clopidogrel (PLAVIX) 75 MG tablet Take 1 tablet (75 mg total) by mouth daily. 90 tablet 3  . cyclobenzaprine (FLEXERIL) 10 MG tablet Take 1 tablet (10 mg total) by mouth 3 (three) times daily as needed for muscle spasms. 60 tablet 5  . HYDROcodone-acetaminophen (NORCO/VICODIN) 5-325 MG tablet Take 1 tablet by mouth every 6 (six) hours as needed for severe pain. 28 tablet 0  . lisinopril (PRINIVIL,ZESTRIL) 20 MG tablet Take 1 tablet (20 mg total) by mouth daily. 90 tablet 2  . meloxicam (MOBIC) 7.5 MG tablet Take 1 tablet (7.5 mg  total) by mouth daily. (Patient taking differently: Take 7.5 mg by mouth as needed. ) 90 tablet 0  . metoprolol succinate (TOPROL-XL) 25 MG 24 hr tablet Take 1 tablet (25 mg total) by mouth daily. 90 tablet 3  . nitroGLYCERIN (NITROSTAT) 0.4 MG SL tablet Place 1 tablet (0.4 mg total) under the tongue every 5 (five) minutes as needed for chest pain. 25 tablet 2  . pantoprazole (PROTONIX) 40 MG tablet TAKE 1 TABLET BY MOUTH EVERY DAY 90 tablet 0  . traMADol (ULTRAM) 50 MG tablet Take 1 tablet (50 mg total) by mouth daily as needed for moderate pain. 30 tablet 0   No current facility-administered medications for this visit.     Past Medical History:  Diagnosis Date  . Abnormal nuclear cardiac imaging test 08/08/2015  . Arthritis    fingers  . Carotid artery occlusion   . Coronary atherosclerosis of native coronary artery 01/29/2013   stent  . Duodenal erosion   . Encounter for screening for lung cancer 07/13/2016  . Esophageal stenosis    esophageal dilation  . GERD (gastroesophageal reflux disease)   . H. pylori infection   . Heart attack [facility]) Oct. 2009   Mild  . Hiatal hernia   . Hyperlipidemia   . Hypertension   . 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 Cardiac catheterization-nonobstructive 60% RCA disease-subsequent nuclear stress test-9 minutes, low risk, mild  inferior wall hypokinesis   . Pain in limb 12/19/2017  . Peripheral vascular disease ([place])   . Unstable angina ([place]) 11/25/2017    Past Surgical History:  Procedure Laterality Date  . APPENDECTOMY    . BACK SURGERY    . CARDIAC CATHETERIZATION N/A 08/07/2015   Procedure: Left Heart Cath and Coronary Angiography;  Surgeon: [name], MD;  Location: [place] [facility];  Service: Cardiovascular;  Laterality: N/A;  . CARDIAC CATHETERIZATION N/A 08/07/2015   Procedure: Coronary Stent Intervention;  Surgeon: [name], MD;  Location: [place] [facility];  Service:  Cardiovascular;  Laterality: N/A;  . CARDIAC CATHETERIZATION N/A 08/07/2015   Procedure: Coronary Stent Intervention;  Surgeon: [name], MD;  Location: [place] [facility];  Service: Cardiovascular;  Laterality: N/A;  . CAROTID ENDARTERECTOMY  01/05/2006   Right  CEA with DPA  . CATARACT EXTRACTION W/ INTRAOCULAR LENS IMPLANT Left 12/04/2017  . CATARACT EXTRACTION W/PHACO Left 12/04/2017   Procedure: CATARACT EXTRACTION PHACO AND INTRAOCULAR LENS PLACEMENT ([place]) LEFT;  Surgeon: Eulogio Bear, MD;  Location: [place];  Service: Ophthalmology;  Laterality: Left;  . CATARACT EXTRACTION W/PHACO Right 02/06/2018   Procedure: CATARACT EXTRACTION PHACO AND INTRAOCULAR LENS PLACEMENT (IOC)RIGHT;  Surgeon: Eulogio Bear, MD;  Location: [place];  Service: Ophthalmology;  Laterality: Right;  . COLONOSCOPY  05/20/2008  . CORONARY STENT PLACEMENT  08/07/2015   MID [place]  . HIP SURGERY Left 10/2016   left hip tendon repair  . LEFT HEART CATH AND CORONARY ANGIOGRAPHY N/A 11/27/2017   Procedure: LEFT HEART CATH AND CORONARY ANGIOGRAPHY;  Surgeon: Wellington Hampshire, MD;  Location: [place] [facility];  Service: Cardiovascular;  Laterality: N/A;  . LOWER EXTREMITY ANGIOGRAPHY Left 02/12/2018   Procedure: LOWER EXTREMITY ANGIOGRAPHY;  Surgeon: Algernon Huxley, MD;  Location: [place] [facility];  Service: Cardiovascular;  Laterality: Left;  . LOWER EXTREMITY ANGIOGRAPHY Left 03/07/2018   Procedure: LOWER EXTREMITY ANGIOGRAPHY;  Surgeon: Algernon Huxley, MD;  Location: [place] [facility];  Service: Cardiovascular;  Laterality: Left;  . LOWER EXTREMITY ANGIOGRAPHY Left 06/04/2018   Procedure: LOWER EXTREMITY ANGIOGRAPHY;  Surgeon: Algernon Huxley, MD;  Location: [place] [facility];  Service: Cardiovascular;  Laterality: Left;  . SPINE SURGERY    . TONSILLECTOMY     Social History        Tobacco Use  . Smoking status: Former Smoker    Packs/day: 1.25    Years:  35.00    Pack years: 43.75    Types: Cigarettes    Last attempt to quit: 02/28/2005    Years since quitting: 13.0  . Smokeless tobacco: Current User    Types: Snuff  Substance Use Topics  . Alcohol use: Yes    Alcohol/week: 8.0 - 10.0 standard drinks    Types: 8 - 10 Glasses of wine per week  . Drug use: No     Family History      Family History  Problem Relation Age of Onset  . Heart attack Mother   . Coronary artery disease Mother   . Heart disease Mother    Carotid Stenosis and BPG and Heart Disease before age 33  . Diabetes Mother   . Hypertension Mother   . Heart attack Father   . Heart disease Father    BPG and Heart Disease before age 73  . Hypertension Father   . Cancer Father 55   throat  . Stroke  Father   . Colon cancer Neg Hx   . Colon polyps Neg Hx   . Esophageal cancer Neg Hx   . Rectal cancer Neg Hx   . Stomach cancer Neg Hx           Allergies  Allergen Reactions  . Brilinta [Ticagrelor]     Shortness of breath  . Zetia [Ezetimibe] Other (See Comments)    Muscle aches  . Statins Other (See Comments)    Failed Crestor 5 mg twice weekly, Crestor 20 mg daily, Pravastatin 40 mg qd, Lipitor, Zocor - muscle aches    REVIEW OF SYSTEMS(Negative unless checked)  Constitutional: [] ???Weight loss[] ???Fever[] ???Chills Cardiac:[] ???Chest pain[] ???Chest pressure[] ???Palpitations [] ???Shortness of breath when laying flat [] ???Shortness of breath at rest [] ???Shortness of breath with exertion. Vascular: [x] ???Pain in legs with walking[] ???Pain in legsat rest[] ???Pain in legs when laying flat [x] ???Claudication [x] ???Pain in feet when walking [] ???Pain in feet at rest [] ???Pain in feet when laying flat [] ???History of DVT [] ???Phlebitis [] ???Swelling in legs [] ???Varicose veins [] ???Non-healing ulcers Pulmonary: [] ???Uses home oxygen [] ???Productive  cough[] ???Hemoptysis [] ???Wheeze [] ???COPD [] ???Asthma Neurologic: [] ???Dizziness [] ???Blackouts [] ???Seizures [] ???History of stroke [x] ???History of TIA[] ???Aphasia [x] ???Temporary blindness[] ???Dysphagia [] ???Weaknessor numbness in arms [] ???Weakness or numbnessin legs Musculoskeletal: [x] ???Arthritis [] ???Joint swelling [] ???Joint pain [] ???Low back pain Hematologic:[] ???Easy bruising[] ???Easy bleeding [] ???Hypercoagulable state [] ???Anemic [] ???Hepatitis Gastrointestinal:[] ???Blood in stool[] ???Vomiting blood[] ???Gastroesophageal reflux/heartburn[] ???Abdominal pain Genitourinary: [] ???Chronic kidney disease [] ???Difficulturination [] ???Frequenturination [] ???Burning with urination[] ???Hematuria Skin: [] ???Rashes [] ???Ulcers [] ???Wounds Psychological: [] ???History of anxiety[] ???History of major depression.    Physical Examination  BP 107/73 (BP Location: Left Arm, Patient Position: Sitting, Cuff Size: Large)   Pulse 61   Resp 10   Ht 5\' 6"  (1.676 m)   Wt 173 lb (78.5 kg)   BMI 27.92 kg/m  Gen:  WD/WN, NAD. Appears younger than stated age. Head: [place]/AT, No temporalis wasting. Ear/Nose/Throat: Hearing grossly intact, nares w/o erythema or drainage Eyes: Conjunctiva clear. Sclera non-icteric Neck: Supple.  Trachea midline Pulmonary:  Good air movement, no use of accessory muscles.  Cardiac: RRR, no JVD Vascular:  Vessel Right Left  Radial Palpable Palpable                          PT Palpable Palpable  DP Palpable Palpable    Musculoskeletal: M/S 5/5 throughout.  No deformity or atrophy. No edema. Neurologic: Sensation grossly intact in extremities.  Symmetrical.  Speech is fluent.  Psychiatric: Judgment intact, Mood & affect appropriate for pt's clinical situation. Dermatologic: No rashes or ulcers noted.  No cellulitis or open wounds.       Labs Recent Results (from the past 2160 hour(s))   POCT urinalysis dipstick     Status: Normal   Collection Time: 05/17/18  8:54 AM  Result Value Ref Range   Color, UA yellow    Clarity, UA clear    Glucose, UA Negative Negative   Bilirubin, UA neg    Ketones, UA neg    Spec Grav, UA 1.015 1.010 - 1.025   Blood, UA neg    pH, UA 6.0 5.0 - 8.0   Protein, UA Negative Negative   Urobilinogen, UA 0.2 0.2 or 1.0 E.U./dL   Nitrite, UA neg    Leukocytes, UA Negative Negative   Appearance clear    Odor no odor   CBC with Differential/Platelet     Status: None   Collection Time: 05/17/18  9:00 AM  Result Value Ref Range   WBC 4.7 3.4 - 10.8 x10E3/uL   RBC 4.83 4.14 - 5.80 x10E6/uL  Hemoglobin 14.6 13.0 - 17.7 g/dL   Hematocrit 42.6 37.5 - 51.0 %   MCV 88 79 - 97 fL   MCH 30.2 26.6 - 33.0 pg   MCHC 34.3 31.5 - 35.7 g/dL   RDW 13.0 11.6 - 15.4 %   Platelets 233 150 - 450 x10E3/uL   Neutrophils 52 Not Estab. %   Lymphs 30 Not Estab. %   Monocytes 12 Not Estab. %   Eos 4 Not Estab. %   Basos 1 Not Estab. %   Neutrophils Absolute 2.4 1.4 - 7.0 x10E3/uL   Lymphocytes Absolute 1.4 0.7 - 3.1 x10E3/uL   Monocytes Absolute 0.6 0.1 - 0.9 x10E3/uL   EOS (ABSOLUTE) 0.2 0.0 - 0.4 x10E3/uL   Basophils Absolute 0.0 0.0 - 0.2 x10E3/uL   Immature Granulocytes 1 Not Estab. %   Immature Grans (Abs) 0.1 0.0 - 0.1 x10E3/uL  Comprehensive metabolic panel     Status: Abnormal   Collection Time: 05/17/18  9:00 AM  Result Value Ref Range   Glucose 94 65 - 99 mg/dL   BUN 33 (H) 8 - 27 mg/dL   Creatinine, Ser 1.08 0.76 - 1.27 mg/dL   GFR calc non Af Amer 70 >59 mL/min/1.73   GFR calc Af Amer 81 >59 mL/min/1.73   BUN/Creatinine Ratio 31 (H) 10 - 24   Sodium 138 134 - 144 mmol/L   Potassium 5.6 (H) 3.5 - 5.2 mmol/L   Chloride 98 96 - 106 mmol/L   CO2 21 20 - 29 mmol/L   Calcium 9.7 8.6 - 10.2 mg/dL   Total Protein 8.0 6.0 - 8.5 g/dL   Albumin 4.8 3.8 - 4.8 g/dL   Globulin, Total 3.2 1.5 - 4.5 g/dL   Albumin/Globulin Ratio 1.5 1.2 - 2.2    Bilirubin Total 0.7 0.0 - 1.2 mg/dL   Alkaline Phosphatase 52 39 - 117 IU/L   AST 27 0 - 40 IU/L   ALT 22 0 - 44 IU/L  Lipid panel     Status: Abnormal   Collection Time: 05/17/18  9:00 AM  Result Value Ref Range   Cholesterol, Total 216 (H) 100 - 199 mg/dL   Triglycerides 88 0 - 149 mg/dL   HDL 71 >39 mg/dL   VLDL Cholesterol Cal 18 5 - 40 mg/dL   LDL Calculated 127 (H) 0 - 99 mg/dL   Chol/HDL Ratio 3.0 0.0 - 5.0 ratio    Comment:                                   T. Chol/HDL Ratio                                             Men  Women                               1/2 Avg.Risk  3.4    3.3                                   Avg.Risk  5.0    4.4  2X Avg.Risk  9.6    7.1                                3X Avg.Risk 23.4   11.0   PSA     Status: None   Collection Time: 05/17/18  9:00 AM  Result Value Ref Range   Prostate Specific Ag, Serum 0.6 0.0 - 4.0 ng/mL    Comment: Roche ECLIA methodology. According to the American Urological Association, Serum PSA should decrease and remain at undetectable levels after radical prostatectomy. The AUA defines biochemical recurrence as an initial PSA value 0.2 ng/mL or greater followed by a subsequent confirmatory PSA value 0.2 ng/mL or greater. Values obtained with different assay methods or kits cannot be used interchangeably. Results cannot be interpreted as absolute evidence of the presence or absence of malignant disease.   BUN     Status: Abnormal   Collection Time: 06/04/18 10:23 AM  Result Value Ref Range   BUN 29 (H) 8 - 23 mg/dL    Comment: Performed at [facility], [address] [place], [place] [address]  Creatinine, serum     Status: None   Collection Time: 06/04/18 10:23 AM  Result Value Ref Range   Creatinine, Ser 0.82 0.61 - 1.24 mg/dL   GFR calc non Af Amer >60 >60 mL/min   GFR calc Af Amer >60 >60 mL/min    Comment: Performed at [facility], [address]  [place], [place] [address]  Basic metabolic panel     Status: Abnormal   Collection Time: 06/05/18  3:05 AM  Result Value Ref Range   Sodium 137 135 - 145 mmol/L   Potassium 4.6 3.5 - 5.1 mmol/L   Chloride 99 98 - 111 mmol/L   CO2 29 22 - 32 mmol/L   Glucose, Bld 111 (H) 70 - 99 mg/dL   BUN 28 (H) 8 - 23 mg/dL   Creatinine, Ser 0.97 0.61 - 1.24 mg/dL   Calcium 8.4 (L) 8.9 - 10.3 mg/dL   GFR calc non Af Amer >60 >60 mL/min   GFR calc Af Amer >60 >60 mL/min   Anion gap 9 5 - 15    Comment: Performed at [facility], [address] [place], [place] [address]  CBC     Status: Abnormal   Collection Time: 06/05/18  3:05 AM  Result Value Ref Range   WBC 5.4 4.0 - 10.5 K/uL   RBC 3.89 (L) 4.22 - 5.81 MIL/uL   Hemoglobin 12.1 (L) 13.0 - 17.0 g/dL   HCT 37.2 (L) 39.0 - 52.0 %   MCV 95.6 80.0 - 100.0 fL   MCH 31.1 26.0 - 34.0 pg   MCHC 32.5 30.0 - 36.0 g/dL   RDW 13.1 11.5 - 15.5 %   Platelets 182 150 - 400 K/uL   nRBC 0.0 0.0 - 0.2 %    Comment: Performed at [facility], [address], [place], [place] [address]    Radiology No results found.  Assessment/Plan Hyperlipidemia LDL goal <70 lipid control important in reducing the progression of atherosclerotic disease.Intolerant to statins   Essential hypertension blood pressure control important in reducing the progression of atherosclerotic disease. On appropriate oral medications.  Carotid stenosis-s/p RCE Checked today.Carotid stenosis in the 40-59% range in the left carotid artery and a patent right CEA. Below the threshold for repair at this point. Recheck in 12 months with  duplex.  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 (HCC) His ABIs today are 1.2 bilaterally with brisk triphasic waveforms consistent with successful revascularization and no residual arterial insufficiency.  Symptoms markedly improved after intervention.  Continue current medical regimen.  Return in 6 months    [name], MD  07/17/2018 9:48 AM    This note was created with Dragon medical transcription system.  Any errors from dictation are purely unintentional

## 2018-07-30 NOTE — Progress Notes (Deleted)
{Choose 1 Note Type (Telehealth Visit or Telephone Visit):[id number]}   Date:  07/30/2018   ID:  [name], DOB [birth year], MRN [id number]  Patient Location: Home Provider Location: Office  PCP:  Glean Hess, MD  Cardiologist:  Nelva Bush, MD  Electrophysiologist:  None   Evaluation Performed:  Follow-Up Visit  Chief Complaint:  ***  History of Present Illness:    [name] is a 68 y.o. male with history of coronary artery disease with NSTEMI and PCI to LCx in 07/2015, nonischemic cardiomyopathy, PAD, hypertension, hyperlipidemia, carotid artery stenosis, hiatal hernia, and peptic ulcer disease.  We are speaking today for follow-up of coronary artery disease and cardiomyopathy.  I last saw [name] in January, at which time he complained about significant left leg pain following percutaneous intervention earlier that month.  He was doing well from a heart standpoint.  He underwent mechanical thrombectomy and catheter-directed thrombolysis to the left SFA in early April by Dr. Lucky Cowboy.  The patient {does/does not:200015} have symptoms concerning for COVID-19 infection (fever, chills, cough, or new shortness of breath).    Past Medical History:  Diagnosis Date  . Abnormal nuclear cardiac imaging test 08/08/2015  . Arthritis    fingers  . Carotid artery occlusion   . Coronary atherosclerosis of native coronary artery 01/29/2013   stent  . Duodenal erosion   . Encounter for screening for lung cancer 07/13/2016  . Esophageal stenosis    esophageal dilation  . GERD (gastroesophageal reflux disease)   . H. pylori infection   . Heart attack [facility]) Oct. 2009   Mild  . Hiatal hernia   . Hyperlipidemia   . Hypertension   . 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 Cardiac catheterization-nonobstructive 60% RCA disease-subsequent nuclear stress test-9 minutes, low risk, mild inferior wall hypokinesis   . Pain in limb  12/19/2017  . Peripheral vascular disease ([place])   . Unstable angina ([place]) 11/25/2017   Past Surgical History:  Procedure Laterality Date  . APPENDECTOMY    . BACK SURGERY    . CARDIAC CATHETERIZATION N/A 08/07/2015   Procedure: Left Heart Cath and Coronary Angiography;  Surgeon: [name], MD;  Location: [place] [facility];  Service: Cardiovascular;  Laterality: N/A;  . CARDIAC CATHETERIZATION N/A 08/07/2015   Procedure: Coronary Stent Intervention;  Surgeon: [name], MD;  Location: [place] [facility];  Service: Cardiovascular;  Laterality: N/A;  . CARDIAC CATHETERIZATION N/A 08/07/2015   Procedure: Coronary Stent Intervention;  Surgeon: [name], MD;  Location: [place] [facility];  Service: Cardiovascular;  Laterality: N/A;  . CAROTID ENDARTERECTOMY  01/05/2006   Right  CEA with DPA  . CATARACT EXTRACTION W/ INTRAOCULAR LENS IMPLANT Left 12/04/2017  . CATARACT EXTRACTION W/PHACO Left 12/04/2017   Procedure: CATARACT EXTRACTION PHACO AND INTRAOCULAR LENS PLACEMENT ([place]) LEFT;  Surgeon: Eulogio Bear, MD;  Location: [place];  Service: Ophthalmology;  Laterality: Left;  . CATARACT EXTRACTION W/PHACO Right 02/06/2018   Procedure: CATARACT EXTRACTION PHACO AND INTRAOCULAR LENS PLACEMENT ([facility])RIGHT;  Surgeon: Eulogio Bear, MD;  Location: [place];  Service: Ophthalmology;  Laterality: Right;  . COLONOSCOPY  05/20/2008  . CORONARY STENT PLACEMENT  08/07/2015   MID [place]  . HIP SURGERY Left 10/2016   left hip tendon repair  . LEFT HEART CATH AND CORONARY ANGIOGRAPHY N/A 11/27/2017   Procedure: LEFT HEART CATH AND CORONARY ANGIOGRAPHY;  Surgeon: Wellington Hampshire,  MD;  Location: [place] [facility];  Service: Cardiovascular;  Laterality: N/A;  . LOWER EXTREMITY ANGIOGRAPHY Left 02/12/2018   Procedure: LOWER EXTREMITY ANGIOGRAPHY;  Surgeon: Algernon Huxley, MD;  Location: [place] [facility];  Service: Cardiovascular;  Laterality: Left;  . LOWER  EXTREMITY ANGIOGRAPHY Left 03/07/2018   Procedure: LOWER EXTREMITY ANGIOGRAPHY;  Surgeon: Algernon Huxley, MD;  Location: [place] [facility];  Service: Cardiovascular;  Laterality: Left;  . LOWER EXTREMITY ANGIOGRAPHY Left 06/04/2018   Procedure: LOWER EXTREMITY ANGIOGRAPHY;  Surgeon: Algernon Huxley, MD;  Location: [place] [facility];  Service: Cardiovascular;  Laterality: Left;  . SPINE SURGERY    . TONSILLECTOMY       No outpatient medications have been marked as taking for the 08/01/18 encounter (Appointment) with Elizet Kaplan, Harrell Gave, MD.     Allergies:   Brilinta [ticagrelor]; Zetia [ezetimibe]; and Statins   Social History   Tobacco Use  . Smoking status: Former Smoker    Packs/day: 1.25    Years: 35.00    Pack years: 43.75    Types: Cigarettes    Last attempt to quit: 02/28/2005    Years since quitting: 13.4  . Smokeless tobacco: Current User    Types: Snuff  Substance Use Topics  . Alcohol use: Yes    Alcohol/week: 8.0 - 10.0 standard drinks    Types: 8 - 10 Glasses of wine per week  . Drug use: No     Family Hx: The patient's family history includes Cancer (age of onset: 45) in his father; Coronary artery disease in his mother; Diabetes in his mother; Heart attack in his father and mother; Heart disease in his father and mother; Hypertension in his father and mother; Stroke in his father. There is no history of Colon cancer, Colon polyps, Esophageal cancer, Rectal cancer, or Stomach cancer.  ROS:   Please see the history of present illness.    *** All other systems reviewed and are negative.   Prior CV studies:   The following studies were reviewed today:  Limited echo (02/27/2018): Mildly dilated LV with mild LVH.  LVEF 40-45% with anterior, anteroseptal, and apical hypokinesis.  Grade 1 diastolic dysfunction.  LHC (11/27/2017): LMCA normal.  LAD with 30% proximal stenosis.  LCx with widely patent mid vessel stent followed by 40% distal stensosi.  40% mid RCA stenosis.  LVEF  40-45% with LVEDP 13 mmHg.  Echo (11/25/2017): Normal LV size with mild LVH.  LVEF 45-50% with grade 1 diastolic dysfunction.  Trivial AI.  Mild MR.  Mild LAE.  Normal RV size and function.  Labs/Other Tests and Data Reviewed:    EKG:  No ECG reviewed.  Recent Labs: 09/06/2017: TSH 1.420 05/17/2018: ALT 22 06/05/2018: BUN 28; Creatinine, Ser 0.97; Hemoglobin 12.1; Platelets 182; Potassium 4.6; Sodium 137   Recent Lipid Panel Lab Results  Component Value Date/Time   CHOL 216 (H) 05/17/2018 09:00 AM   TRIG 88 05/17/2018 09:00 AM   HDL 71 05/17/2018 09:00 AM   CHOLHDL 3.0 05/17/2018 09:00 AM   CHOLHDL 4.2 11/25/2017 06:02 AM   LDLCALC 127 (H) 05/17/2018 09:00 AM    Wt Readings from Last 3 Encounters:  07/17/18 173 lb (78.5 kg)  06/05/18 174 lb 4.8 oz (79.1 kg)  06/01/18 174 lb (78.9 kg)     Objective:    Vital Signs:  There were no vitals taken for this visit.   {[facility]Care Virtual Exam (Optional):[id number]::"VITAL SIGNS:  reviewed"}  ASSESSMENT & PLAN:  1. ***  COVID-19 Education: The signs and symptoms of COVID-19 were discussed with the patient and how to seek care for testing (follow up with PCP or arrange E-visit).  ***The importance of social distancing was discussed today.  Time:   Today, I have spent *** minutes with the patient with telehealth technology discussing the above problems.     Medication Adjustments/Labs and Tests Ordered: Current medicines are reviewed at length with the patient today.  Concerns regarding medicines are outlined above.   Tests Ordered: No orders of the defined types were placed in this encounter.   Medication Changes: No orders of the defined types were placed in this encounter.   Disposition:  Follow up {follow up:15908}  Signed, Nelva Bush, MD  07/30/2018 8:01 AM    [facility]

## 2018-08-01 ENCOUNTER — Telehealth: Payer: PPO | Admitting: Internal Medicine

## 2018-08-01 ENCOUNTER — Telehealth: Payer: Self-pay | Admitting: *Deleted

## 2018-08-01 ENCOUNTER — Other Ambulatory Visit: Payer: Self-pay

## 2018-08-01 NOTE — Telephone Encounter (Signed)
Lmovm to contact office to reschedule pt's virtual visit. Pt's visit was cancelled this am per pt's request and wanted [place] to return his call later this evening to reschedule his appointment for another day.

## 2018-08-10 NOTE — Progress Notes (Signed)
Virtual Visit via Telephone Note   This visit type was conducted due to national recommendations for restrictions regarding the COVID-19 Pandemic (e.g. social distancing) in an effort to limit this patient's exposure and mitigate transmission in our community.  Due to his co-morbid illnesses, this patient is at least at moderate risk for complications without adequate follow up.  This format is felt to be most appropriate for this patient at this time.  The patient did not have access to video technology/had technical difficulties with video requiring transitioning to audio format only (telephone).  All issues noted in this document were discussed and addressed.  No physical exam could be performed with this format.  Please refer to the patient's chart for his  consent to telehealth for [facility].   Date:  08/13/2018   ID:  [name], DOB 10-20-1950, MRN [id number]  Patient Location: Home Provider Location: Office  PCP:  Glean Hess, MD  Cardiologist:  Nelva Bush, MD  Electrophysiologist:  None   Evaluation Performed:  Follow-Up Visit  Chief Complaint:  Follow-up cardiovascular disease and NICM  History of Present Illness:    [name] is a 68 y.o. male with history of coronary artery disease with NSTEMI and PCI to LCx in 07/2015,nonischemic cardiomyopathy, PAD, hypertension, hyperlipidemia, carotid artery stenosis, hiatal hernia, and peptic ulcer disease.  We are speaking today for follow-up of coronary artery disease and cardiomyopathy.  I last saw [name] in January, at which time he complained about significant left leg pain following percutaneous intervention earlier that month.  He was doing well from a heart standpoint.  He underwent mechanical thrombectomy and catheter-directed thrombolysis to the left SFA in early April by Dr. Lucky Cowboy.  He reports minimal leg pain at this time.  From a heart standpoint, [name] continues to do well.  He denies chest pain,  shortness of breath, edema, palpitations, and lightheadedness.  He remains enrolled in the CLEAR study for management of his hyperlipidemia.  He notes that he was also enrolled in a PCSK9 inhibitor trial before the CLEAR trial and had marked reduction in his LDL without medication side effects.  BP is above his normal readings this morning due to stress/excitement about purchasing a new home.  He is tolerating his medications well.  The patient does not have symptoms concerning for COVID-19 infection (fever, chills, cough, or new shortness of breath).    Past Medical History:  Diagnosis Date   Abnormal nuclear cardiac imaging test 08/08/2015   Arthritis    fingers   Carotid artery occlusion    Coronary atherosclerosis of native coronary artery 01/29/2013   stent   Duodenal erosion    Encounter for screening for lung cancer 07/13/2016   Esophageal stenosis    esophageal dilation   GERD (gastroesophageal reflux disease)    H. pylori infection    Heart attack ([place]) Oct. 2009   Mild   Hiatal hernia    Hyperlipidemia    Hypertension    Old myocardial infarction 11/29/2007   Mildly elevated troponin, isolated value in October 2009. Cardiac catheterization-nonobstructive 60% RCA disease-subsequent nuclear stress test-9 minutes, low risk, mild inferior wall hypokinesis    Pain in limb 12/19/2017   Peripheral vascular disease ([place])    Unstable angina ([place]) 11/25/2017   Past Surgical History:  Procedure Laterality Date   APPENDECTOMY     BACK SURGERY     CARDIAC CATHETERIZATION N/A 08/07/2015   Procedure: Left Heart Cath and Coronary Angiography;  Surgeon: [name], MD;  Location: [place] [facility];  Service: Cardiovascular;  Laterality: N/A;   CARDIAC CATHETERIZATION N/A 08/07/2015   Procedure: Coronary Stent Intervention;  Surgeon: [name], MD;  Location: [place] [facility];  Service: Cardiovascular;  Laterality: N/A;   CARDIAC CATHETERIZATION N/A 08/07/2015    Procedure: Coronary Stent Intervention;  Surgeon: [name], MD;  Location: [place] [facility];  Service: Cardiovascular;  Laterality: N/A;   CAROTID ENDARTERECTOMY  01/05/2006   Right  CEA with DPA   CATARACT EXTRACTION W/ INTRAOCULAR LENS IMPLANT Left 12/04/2017   CATARACT EXTRACTION W/PHACO Left 12/04/2017   Procedure: CATARACT EXTRACTION PHACO AND INTRAOCULAR LENS PLACEMENT ([place]) LEFT;  Surgeon: Eulogio Bear, MD;  Location: [place];  Service: Ophthalmology;  Laterality: Left;   CATARACT EXTRACTION W/PHACO Right 02/06/2018   Procedure: CATARACT EXTRACTION PHACO AND INTRAOCULAR LENS PLACEMENT (IOC)RIGHT;  Surgeon: Eulogio Bear, MD;  Location: [place];  Service: Ophthalmology;  Laterality: Right;   COLONOSCOPY  05/20/2008   CORONARY STENT PLACEMENT  08/07/2015   MID CIRCUMFLEX   HIP SURGERY Left 10/2016   left hip tendon repair   LEFT HEART CATH AND CORONARY ANGIOGRAPHY N/A 11/27/2017   Procedure: LEFT HEART CATH AND CORONARY ANGIOGRAPHY;  Surgeon: Wellington Hampshire, MD;  Location: [place] [facility];  Service: Cardiovascular;  Laterality: N/A;   LOWER EXTREMITY ANGIOGRAPHY Left 02/12/2018   Procedure: LOWER EXTREMITY ANGIOGRAPHY;  Surgeon: Algernon Huxley, MD;  Location: [place] [facility];  Service: Cardiovascular;  Laterality: Left;   LOWER EXTREMITY ANGIOGRAPHY Left 03/07/2018   Procedure: LOWER EXTREMITY ANGIOGRAPHY;  Surgeon: Algernon Huxley, MD;  Location: [place] [facility];  Service: Cardiovascular;  Laterality: Left;   LOWER EXTREMITY ANGIOGRAPHY Left 06/04/2018   Procedure: LOWER EXTREMITY ANGIOGRAPHY;  Surgeon: Algernon Huxley, MD;  Location: [place] [facility];  Service: Cardiovascular;  Laterality: Left;   SPINE SURGERY     TONSILLECTOMY       Current Meds  Medication Sig   AMBULATORY NON FORMULARY MEDICATION Take 180 mg by mouth daily. Medication Name: bempedoic acid vs placebo. CLEAR Research study drug provided    amLODipine (NORVASC) 5 MG tablet Take 1 tablet (5 mg total) by mouth daily.   aspirin EC 81 MG tablet Take 1 tablet (81 mg total) by mouth daily.   clopidogrel (PLAVIX) 75 MG tablet Take 1 tablet (75 mg total) by mouth daily.   cyclobenzaprine (FLEXERIL) 10 MG tablet Take 1 tablet (10 mg total) by mouth 3 (three) times daily as needed for muscle spasms.   HYDROcodone-acetaminophen (NORCO/VICODIN) 5-325 MG tablet Take 1 tablet by mouth every 6 (six) hours as needed for severe pain.   lisinopril (PRINIVIL,ZESTRIL) 20 MG tablet Take 1 tablet (20 mg total) by mouth daily.   meloxicam (MOBIC) 7.5 MG tablet Take 1 tablet (7.5 mg total) by mouth daily. (Patient taking differently: Take 7.5 mg by mouth as needed. )   metoprolol succinate (TOPROL-XL) 25 MG 24 hr tablet Take 1 tablet (25 mg total) by mouth daily.   nitroGLYCERIN (NITROSTAT) 0.4 MG SL tablet Place 1 tablet (0.4 mg total) under the tongue every 5 (five) minutes as needed for chest pain.   pantoprazole (PROTONIX) 40 MG tablet TAKE 1 TABLET BY MOUTH EVERY DAY   traMADol (ULTRAM) 50 MG tablet Take 1 tablet (50 mg total) by mouth daily as needed for moderate pain.     Allergies:   Brilinta [ticagrelor], Zetia [  ezetimibe], and Statins   Social History   Tobacco Use   Smoking status: Former Smoker    Packs/day: 1.25    Years: 35.00    Pack years: 43.75    Types: Cigarettes    Quit date: 02/28/2005    Years since quitting: 13.4   Smokeless tobacco: Current User    Types: Snuff  Substance Use Topics   Alcohol use: Yes    Alcohol/week: 8.0 - 10.0 standard drinks    Types: 8 - 10 Glasses of wine per week   Drug use: No     Family Hx: The patient's family history includes Cancer (age of onset: 76) in his father; Coronary artery disease in his mother; Diabetes in his mother; Heart attack in his father and mother; Heart disease in his father and mother; Hypertension in his father and mother; Stroke in his father. There is no  history of Colon cancer, Colon polyps, Esophageal cancer, Rectal cancer, or Stomach cancer.  ROS:   Please see the history of present illness.   All other systems reviewed and are negative.   Prior CV studies:   The following studies were reviewed today:  Limited echo (02/27/2018): Mildly dilated LV with mild LVH.  LVEF 40-45% with anterior, anteroseptal, and apical hypokinesis.  Grade 1 diastolic dysfunction.  LHC (11/27/2017): LMCA normal.  LAD with 30% proximal stenosis.  LCx with widely patent mid vessel stent followed by 40% distal stensosi.  40% mid RCA stenosis.  LVEF 40-45% with LVEDP 13 mmHg.  Echo (11/25/2017): Normal LV size with mild LVH.  LVEF 45-50% with grade 1 diastolic dysfunction.  Trivial AI.  Mild MR.  Mild LAE.  Normal RV size and function.  Labs/Other Tests and Data Reviewed:    EKG:  No ECG reviewed.  Recent Labs: 09/06/2017: TSH 1.420 05/17/2018: ALT 22 06/05/2018: BUN 28; Creatinine, Ser 0.97; Hemoglobin 12.1; Platelets 182; Potassium 4.6; Sodium 137   Recent Lipid Panel Lab Results  Component Value Date/Time   CHOL 216 (H) 05/17/2018 09:00 AM   TRIG 88 05/17/2018 09:00 AM   HDL 71 05/17/2018 09:00 AM   CHOLHDL 3.0 05/17/2018 09:00 AM   CHOLHDL 4.2 11/25/2017 06:02 AM   LDLCALC 127 (H) 05/17/2018 09:00 AM    Wt Readings from Last 3 Encounters:  08/13/18 176 lb (79.8 kg)  07/17/18 173 lb (78.5 kg)  06/05/18 174 lb 4.8 oz (79.1 kg)     Objective:    Vital Signs:  BP (!) 156/94 (BP Location: Left Arm, Patient Position: Sitting, Cuff Size: Normal)    Pulse 66    Ht 5\' 6"  (1.676 m)    Wt 176 lb (79.8 kg)    BMI 28.41 kg/m    VITAL SIGNS:  reviewed GEN:  no acute distress  ASSESSMENT & PLAN:    Coronary artery disease: No symptoms to suggest worsening coronary insufficiency.  As his claudication has resolved, I encouraged Mr. Chavous to exercise regularly.  He is on DAPT with aspirin and clopidogrel following recent peripheral vascular interventions.  I  will defer duration of DAPT to vascular surgery.  NICM: Mr. Shell denies signs/symptoms of decompensated heart failure (NYHA class I-II).  We will continue current doses of lisinopril and metoprolol.  PAD: Claudication much improved after most recent intervention in April.  Continue current medications and follow-up with vascular surgery.  Hypertension: BP typically well controlled but above goal today.  We will defer medication changes at this time.  I advised Mr. Belmontes to contact  us if his BP is consistently above 140/90 so that we can discuss escalation of his antihypertensive regimen.  Hyperlipidemia: Mr. Heffington remains enrolled in the CLEAR trial.  Most recent lipid panels have shown suboptimal LDL control.  Given his CAD and PAD, I would like to target an LDL < 70.  I will reach out to our research team and lipid clinic to determine that status of Mr. Cullipher enrollment in the trial, as bempedoic acid has been approved by the FDA.  We may need to consider switching him to a PCSK9 inhibitor, if no cost-prohibitive, as he previously had good LDL control with this.  COVID-19 Education: The signs and symptoms of COVID-19 were discussed with the patient and how to seek care for testing (follow up with PCP or arrange E-visit).  The importance of social distancing was discussed today.  Time:   Today, I have spent 15 minutes with the patient with telehealth technology discussing the above problems.     Medication Adjustments/Labs and Tests Ordered: Current medicines are reviewed at length with the patient today.  Concerns regarding medicines are outlined above.   Tests Ordered: None.  Medication Changes: None.  Disposition:  Follow up in 6 month(s)  Signed, Nelva Bush, MD  08/13/2018 7:56 AM    [facility]

## 2018-08-13 ENCOUNTER — Other Ambulatory Visit: Payer: Self-pay

## 2018-08-13 ENCOUNTER — Other Ambulatory Visit: Payer: Self-pay | Admitting: Gastroenterology

## 2018-08-13 ENCOUNTER — Telehealth (INDEPENDENT_AMBULATORY_CARE_PROVIDER_SITE_OTHER): Payer: PPO | Admitting: Internal Medicine

## 2018-08-13 ENCOUNTER — Telehealth: Payer: Self-pay | Admitting: Pharmacist

## 2018-08-13 VITALS — BP 156/94 | HR 66 | Ht 66.0 in | Wt 176.0 lb

## 2018-08-13 DIAGNOSIS — I10 Essential (primary) hypertension: Secondary | ICD-10-CM

## 2018-08-13 DIAGNOSIS — I2510 Atherosclerotic heart disease of native coronary artery without angina pectoris: Secondary | ICD-10-CM

## 2018-08-13 DIAGNOSIS — I739 Peripheral vascular disease, unspecified: Secondary | ICD-10-CM | POA: Insufficient documentation

## 2018-08-13 DIAGNOSIS — I428 Other cardiomyopathies: Secondary | ICD-10-CM

## 2018-08-13 DIAGNOSIS — E785 Hyperlipidemia, unspecified: Secondary | ICD-10-CM

## 2018-08-13 MED ORDER — PRALUENT 150 MG/ML ~~LOC~~ SOAJ: 1.0000 "pen " | SUBCUTANEOUS | 11 refills | Status: DC

## 2018-08-13 NOTE — Telephone Encounter (Signed)
Left message for pt to discuss PCSK9i therapy. Looks as though he was previously on Praluent in 2016. Copay with current insurance would be $90/month or $180/3 months. Will await patient's return call.

## 2018-08-13 NOTE — Patient Instructions (Addendum)
Medication Instructions:  - Your physician recommends that you continue on your current medications as directed. Please refer to the Current Medication list given to you today.  If you need a refill on your cardiac medications before your next appointment, please call your pharmacy.   Lab work: - none ordred  If you have labs (blood work) drawn today and your tests are completely normal, you will receive your results only by: Marland Kitchen MyChart Message (if you have MyChart) OR . A paper copy in the mail If you have any lab test that is abnormal or we need to change your treatment, we will call you to review the results.  Testing/Procedures: - none ordered  Follow-Up: At [facility], you and your health needs are our priority.  As part of our continuing mission to provide you with exceptional heart care, we have created designated Provider Care Teams.  These Care Teams include your primary Cardiologist (physician) and Advanced Practice Providers (APPs -  Physician Assistants and Nurse Practitioners) who all work together to provide you with the care you need, when you need it.  You will need a follow up appointment in 6 months (December) Please call our office 2 months in advance to schedule this appointment.  (call in early October to schedule)   You may see Nelva Bush, MD or one of the following Advanced Practice Providers on your designated Care Team:   Murray Hodgkins, NP Christell Faith, PA-C . Marrianne Mood, PA-C  Any Other Special Instructions Will Be Listed Below (If Applicable). -  Dr. Saunders Revel has reached out to our lipid clinic & research team about further management of your lipids. You should hear from someone in those departments in the next few days about next steps.

## 2018-08-13 NOTE — Telephone Encounter (Signed)
Prior authorization approved through 10/08/18. Pt aware of $0 copay and will call once he starts injections to set up lab work.

## 2018-08-13 NOTE — Telephone Encounter (Addendum)
Spoke with pt, he reports tolerating Praluent very well in the past and only stopped therapy due to cost. Discussed copay with patient's current insurance plan as well as [employer] which has been assisting with patient copays. He would like to resume Praluent if he qualifies for patient assistance.  Will submit prior authorization for Praluent 150mg  (previous dose taken by pt) as well as pt assistance application through the [employer].  [employer] application approved immediately for $2500 in copay assistance through 07/13/19. Will send rx to local pharmacy once prior authorization is approved. ID [id number], [place], PCN [id number], GRP [id number]

## 2018-08-13 NOTE — Telephone Encounter (Signed)
-----   Message from Nelva Bush, MD sent at 08/13/2018  9:19 AM EDT ----- Regarding: Lipid therapy Good morning,  I wanted to touch base regarding Mr. Maneri lipid therapy.  He has a history of statin intolerance and is currently enrolled in the CLEAR trial.  Unfortunately, his LDL has been suboptimally controlled (most recent LDL in Epic in March was 127) with progression of ASCVD (he has needed multiple lower extremity interventions over the last 6 month.  Are there any steps that can be taken within the CLEAR trial to help improve his lipid control?  If not, I worry that continuation of current therapy (placebo versus bempedoic acid) does not meet guideline-based therapy and warrants discontinuation of study drug and initiation of PCSK9 inhibitor therapy (if not cost prohibitive).  I welcome your thoughts and suggestions on the matter.  Thanks.  Chris End

## 2018-08-13 NOTE — Telephone Encounter (Signed)
Great.  Thank you for your help.  Gerald Stabs

## 2018-08-14 ENCOUNTER — Telehealth: Payer: Self-pay

## 2018-08-14 NOTE — Telephone Encounter (Signed)
Returned patients call.  He has requested to reschedule his colonoscopy from 07/09 to 07/16.  Contacted Debbie in Endo to notify of date change.  Advised patient to have COVID 19 test on 09/10/18 at [facility] between 10:30am-12:30pm.  Thanks Sharyn Lull

## 2018-08-27 ENCOUNTER — Telehealth: Payer: Self-pay

## 2018-08-27 NOTE — Telephone Encounter (Signed)
Patient has been advised to stop blood thinner Plavix 5 days prior to his colonoscopy and restart 1 day after (Per Staff Message from Dr. Lucky Cowboy pm 08/14/18).  He has also been reminded to go for his COVID test.  Thanks Sharyn Lull

## 2018-09-03 ENCOUNTER — Telehealth: Payer: Self-pay

## 2018-09-03 ENCOUNTER — Other Ambulatory Visit: Payer: Self-pay

## 2018-09-03 DIAGNOSIS — Z01812 Encounter for preprocedural laboratory examination: Secondary | ICD-10-CM

## 2018-09-03 NOTE — Telephone Encounter (Signed)
Contacted patient to see if he had any questions regarding his colonoscopy and to advise regarding COVID test requirements.  He was already aware and plans on going for COVID testing on 07/13 for 09/13/18 procedure.  Thanks [employer]

## 2018-09-05 ENCOUNTER — Encounter: Payer: Self-pay | Admitting: *Deleted

## 2018-09-05 ENCOUNTER — Other Ambulatory Visit: Payer: Self-pay

## 2018-09-10 ENCOUNTER — Other Ambulatory Visit
Admission: RE | Admit: 2018-09-10 | Discharge: 2018-09-10 | Disposition: A | Discharge status: Home / Self Care | Payer: PPO | Source: Ambulatory Visit | Attending: Gastroenterology | Admitting: Gastroenterology

## 2018-09-10 ENCOUNTER — Other Ambulatory Visit: Payer: Self-pay

## 2018-09-10 DIAGNOSIS — Z1159 Encounter for screening for other viral diseases: Secondary | ICD-10-CM | POA: Insufficient documentation

## 2018-09-11 LAB — SARS CORONAVIRUS 2 (TAT 6-24 HRS): SARS Coronavirus 2: NEGATIVE

## 2018-09-12 ENCOUNTER — Ambulatory Visit (INDEPENDENT_AMBULATORY_CARE_PROVIDER_SITE_OTHER): Payer: PPO

## 2018-09-12 ENCOUNTER — Other Ambulatory Visit: Payer: Self-pay

## 2018-09-12 VITALS — BP 122/82 | HR 63 | Temp 98.1°F | Resp 16 | Ht 66.0 in | Wt 177.0 lb

## 2018-09-12 DIAGNOSIS — Z0000 Encounter for general adult medical examination without abnormal findings: Secondary | ICD-10-CM

## 2018-09-12 NOTE — Patient Instructions (Signed)
[name] , Thank you for taking time to come for your Medicare Wellness Visit. I appreciate your ongoing commitment to your health goals. Please review the following plan we discussed and let me know if I can assist you in the future.   Screening recommendations/referrals: Colonoscopy: done 05/20/08 scheduled for 09/13/18  Recommended yearly ophthalmology/optometry visit for glaucoma screening and checkup Recommended yearly dental visit for hygiene and checkup  Vaccinations: Influenza vaccine: done 12/12/17 Pneumococcal vaccine: done 01/06/17 Tdap vaccine: done 04/18/12 Shingles vaccine: Shingrix discussed. Please contact your pharmacy for coverage information.   Advanced directives: Please bring a copy of your health care power of attorney and living will to the office at your convenience.  Conditions/risks identified: Recommend drinking 6-8 glasses of water per day.  Next appointment: Please follow up in one year for your Medicare Annual Wellness visit.    Preventive Care 69 Years and Older, Male Preventive care refers to lifestyle choices and visits with your health care provider that can promote health and wellness. What does preventive care include?  A yearly physical exam. This is also called an annual well check.  Dental exams once or twice a year.  Routine eye exams. Ask your health care provider how often you should have your eyes checked.  Personal lifestyle choices, including:  Daily care of your teeth and gums.  Regular physical activity.  Eating a healthy diet.  Avoiding tobacco and drug use.  Limiting alcohol use.  Practicing safe sex.  Taking low doses of aspirin every day.  Taking vitamin and mineral supplements as recommended by your health care provider. What happens during an annual well check? The services and screenings done by your health care provider during your annual well check will depend on your age, overall health, lifestyle risk factors, and  family history of disease. Counseling  Your health care provider may ask you questions about your:  Alcohol use.  Tobacco use.  Drug use.  Emotional well-being.  Home and relationship well-being.  Sexual activity.  Eating habits.  History of falls.  Memory and ability to understand (cognition).  Work and work Statistician. Screening  You may have the following tests or measurements:  Height, weight, and BMI.  Blood pressure.  Lipid and cholesterol levels. These may be checked every 5 years, or more frequently if you are over 27 years old.  Skin check.  Lung cancer screening. You may have this screening every year starting at age 69 if you have a 30-pack-year history of smoking and currently smoke or have quit within the past 15 years.  Fecal occult blood test (FOBT) of the stool. You may have this test every year starting at age 69.  Flexible sigmoidoscopy or colonoscopy. You may have a sigmoidoscopy every 5 years or a colonoscopy every 10 years starting at age 45.  Prostate cancer screening. Recommendations will vary depending on your family history and other risks.  Hepatitis C blood test.  Hepatitis B blood test.  Sexually transmitted disease (STD) testing.  Diabetes screening. This is done by checking your blood sugar (glucose) after you have not eaten for a while (fasting). You may have this done every 1-3 years.  Abdominal aortic aneurysm (AAA) screening. You may need this if you are a current or former smoker.  Osteoporosis. You may be screened starting at age 75 if you are at high risk. Talk with your health care provider about your test results, treatment options, and if necessary, the need for more tests. Vaccines  Your health care provider may recommend certain vaccines, such as:  Influenza vaccine. This is recommended every year.  Tetanus, diphtheria, and acellular pertussis (Tdap, Td) vaccine. You may need a Td booster every 10 years.  Zoster  vaccine. You may need this after age 66.  Pneumococcal 13-valent conjugate (PCV13) vaccine. One dose is recommended after age 21.  Pneumococcal polysaccharide (PPSV23) vaccine. One dose is recommended after age 69. Talk to your health care provider about which screenings and vaccines you need and how often you need them. This information is not intended to replace advice given to you by your health care provider. Make sure you discuss any questions you have with your health care provider. Document Released: 03/13/2015 Document Revised: 11/04/2015 Document Reviewed: 12/16/2014 Elsevier Interactive Patient Education  2017 [place] Prevention in the Home Falls can cause injuries. They can happen to people of all ages. There are many things you can do to make your home safe and to help prevent falls. What can I do on the outside of my home?  Regularly fix the edges of walkways and driveways and fix any cracks.  Remove anything that might make you trip as you walk through a door, such as a raised step or threshold.  Trim any bushes or trees on the path to your home.  Use bright outdoor lighting.  Clear any walking paths of anything that might make someone trip, such as rocks or tools.  Regularly check to see if handrails are loose or broken. Make sure that both sides of any steps have handrails.  Any raised decks and porches should have guardrails on the edges.  Have any leaves, snow, or ice cleared regularly.  Use sand or salt on walking paths during winter.  Clean up any spills in your garage right away. This includes oil or grease spills. What can I do in the bathroom?  Use night lights.  Install grab bars by the toilet and in the tub and shower. Do not use towel bars as grab bars.  Use non-skid mats or decals in the tub or shower.  If you need to sit down in the shower, use a plastic, non-slip stool.  Keep the floor dry. Clean up any water that spills on the  floor as soon as it happens.  Remove soap buildup in the tub or shower regularly.  Attach bath mats securely with double-sided non-slip rug tape.  Do not have throw rugs and other things on the floor that can make you trip. What can I do in the bedroom?  Use night lights.  Make sure that you have a light by your bed that is easy to reach.  Do not use any sheets or blankets that are too big for your bed. They should not hang down onto the floor.  Have a firm chair that has side arms. You can use this for support while you get dressed.  Do not have throw rugs and other things on the floor that can make you trip. What can I do in the kitchen?  Clean up any spills right away.  Avoid walking on wet floors.  Keep items that you use a lot in easy-to-reach places.  If you need to reach something above you, use a strong step stool that has a grab bar.  Keep electrical cords out of the way.  Do not use floor polish or wax that makes floors slippery. If you must use wax, use non-skid floor wax.  Do  not have throw rugs and other things on the floor that can make you trip. What can I do with my stairs?  Do not leave any items on the stairs.  Make sure that there are handrails on both sides of the stairs and use them. Fix handrails that are broken or loose. Make sure that handrails are as [place] as the stairways.  Check any carpeting to make sure that it is firmly attached to the stairs. Fix any carpet that is loose or worn.  Avoid having throw rugs at the top or bottom of the stairs. If you do have throw rugs, attach them to the floor with carpet tape.  Make sure that you have a light switch at the top of the stairs and the bottom of the stairs. If you do not have them, ask someone to add them for you. What else can I do to help prevent falls?  Wear shoes that:  Do not have high heels.  Have rubber bottoms.  Are comfortable and fit you well.  Are closed at the toe. Do not wear  sandals.  If you use a stepladder:  Make sure that it is fully opened. Do not climb a closed stepladder.  Make sure that both sides of the stepladder are locked into place.  Ask someone to hold it for you, if possible.  Clearly mark and make sure that you can see:  Any grab bars or handrails.  First and last steps.  Where the edge of each step is.  Use tools that help you move around (mobility aids) if they are needed. These include:  Canes.  Walkers.  Scooters.  Crutches.  Turn on the lights when you go into a dark area. Replace any light bulbs as soon as they burn out.  Set up your furniture so you have a clear path. Avoid moving your furniture around.  If any of your floors are uneven, fix them.  If there are any pets around you, be aware of where they are.  Review your medicines with your doctor. Some medicines can make you feel dizzy. This can increase your chance of falling. Ask your doctor what other things that you can do to help prevent falls. This information is not intended to replace advice given to you by your health care provider. Make sure you discuss any questions you have with your health care provider. Document Released: 12/11/2008 Document Revised: 07/23/2015 Document Reviewed: 03/21/2014 Elsevier Interactive Patient Education  2017 [employer].

## 2018-09-12 NOTE — Discharge Instructions (Signed)

## 2018-09-12 NOTE — Progress Notes (Signed)
Subjective:   [name] is a 68 y.o. male who presents for Medicare Annual/Subsequent preventive examination.  Review of Systems:   Cardiac Risk Factors include: advanced age (>72men, >58 women);dyslipidemia;hypertension;male gender     Objective:    Vitals: BP 122/82 (BP Location: Left Arm, Patient Position: Sitting, Cuff Size: Normal)   Pulse 63   Temp 98.1 F (36.7 C) (Oral)   Resp 16   Ht 5\' 6"  (1.676 m)   Wt 177 lb (80.3 kg)   SpO2 95%   BMI 28.57 kg/m   Body mass index is 28.57 kg/m.  Advanced Directives 09/12/2018 06/04/2018 06/04/2018 03/07/2018 02/12/2018 02/06/2018 12/04/2017  Does Patient Have a Medical Advance Directive? Yes Yes Yes - Yes Yes Yes  Type of Advance Directive [place];Living will [place];Living will [place];Living will - [place];Living will Living will [place]  Does patient want to make changes to medical advance directive? - No - Patient declined No - Patient declined No - Patient declined No - Patient declined No - Patient declined No - Patient declined  Copy of [place] in Chart? No - copy requested No - copy requested No - copy requested No - copy requested No - copy requested - No - copy requested  Would patient like information on creating a medical advance directive? - - - - - - No - Patient declined    Tobacco Social History   Tobacco Use  Smoking Status Former Smoker  . Packs/day: 1.25  . Years: 35.00  . Pack years: 43.75  . Types: Cigarettes  . Quit date: 02/28/2005  . Years since quitting: 13.5  Smokeless Tobacco Current User  . Types: Snuff     Ready to quit: Not Answered Counseling given: Not Answered   Clinical Intake:  Pre-visit preparation completed: Yes  Pain : No/denies pain     BMI - recorded: 28.57 Nutritional Status: BMI 25 -29 Overweight Nutritional Risks: None Diabetes: No  How often do  you need to have someone help you when you read instructions, pamphlets, or other written materials from your doctor or pharmacy?: 1 - Never  Interpreter Needed?: No  Information entered by :: [name] LPN  Past Medical History:  Diagnosis Date  . Abnormal nuclear cardiac imaging test 08/08/2015  . Arthritis    fingers  . Carotid artery occlusion   . Coronary atherosclerosis of native coronary artery 01/29/2013   stent  . Duodenal erosion   . Encounter for screening for lung cancer 07/13/2016  . Esophageal stenosis    esophageal dilation  . GERD (gastroesophageal reflux disease)   . H. pylori infection   . Heart attack [facility]) Oct. 2009   Mild  . Hiatal hernia   . Hyperlipidemia   . Hypertension   . 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 Cardiac catheterization-nonobstructive 60% RCA disease-subsequent nuclear stress test-9 minutes, low risk, mild inferior wall hypokinesis   . Pain in limb 12/19/2017  . Peripheral vascular disease ([place])   . Unstable angina ([place]) 11/25/2017   Past Surgical History:  Procedure Laterality Date  . APPENDECTOMY    . BACK SURGERY    . CARDIAC CATHETERIZATION N/A 08/07/2015   Procedure: Left Heart Cath and Coronary Angiography;  Surgeon: [name], MD;  Location: [place] [facility];  Service: Cardiovascular;  Laterality: N/A;  . CARDIAC CATHETERIZATION N/A 08/07/2015  Procedure: Coronary Stent Intervention;  Surgeon: [name], MD;  Location: [place] [facility];  Service: Cardiovascular;  Laterality: N/A;  . CARDIAC CATHETERIZATION N/A 08/07/2015   Procedure: Coronary Stent Intervention;  Surgeon: [name], MD;  Location: [place] [facility];  Service: Cardiovascular;  Laterality: N/A;  . CAROTID ENDARTERECTOMY  01/05/2006   Right  CEA with DPA  . CATARACT EXTRACTION W/ INTRAOCULAR LENS IMPLANT Left 12/04/2017  . CATARACT EXTRACTION W/PHACO Left 12/04/2017   Procedure: CATARACT  EXTRACTION PHACO AND INTRAOCULAR LENS PLACEMENT ([place]) LEFT;  Surgeon: Eulogio Bear, MD;  Location: [place];  Service: Ophthalmology;  Laterality: Left;  . CATARACT EXTRACTION W/PHACO Right 02/06/2018   Procedure: CATARACT EXTRACTION PHACO AND INTRAOCULAR LENS PLACEMENT ([facility])RIGHT;  Surgeon: Eulogio Bear, MD;  Location: [place];  Service: Ophthalmology;  Laterality: Right;  . COLONOSCOPY  05/20/2008  . CORONARY STENT PLACEMENT  08/07/2015   [facility] [place]  . HIP SURGERY Left 10/2016   left hip tendon repair  . LEFT HEART CATH AND CORONARY ANGIOGRAPHY N/A 11/27/2017   Procedure: LEFT HEART CATH AND CORONARY ANGIOGRAPHY;  Surgeon: Wellington Hampshire, MD;  Location: [place] [facility];  Service: Cardiovascular;  Laterality: N/A;  . LOWER EXTREMITY ANGIOGRAPHY Left 02/12/2018   Procedure: LOWER EXTREMITY ANGIOGRAPHY;  Surgeon: Algernon Huxley, MD;  Location: [place] [facility];  Service: Cardiovascular;  Laterality: Left;  . LOWER EXTREMITY ANGIOGRAPHY Left 03/07/2018   Procedure: LOWER EXTREMITY ANGIOGRAPHY;  Surgeon: Algernon Huxley, MD;  Location: [place] [facility];  Service: Cardiovascular;  Laterality: Left;  . LOWER EXTREMITY ANGIOGRAPHY Left 06/04/2018   Procedure: LOWER EXTREMITY ANGIOGRAPHY;  Surgeon: Algernon Huxley, MD;  Location: [place] [facility];  Service: Cardiovascular;  Laterality: Left;  . SPINE SURGERY    . TONSILLECTOMY     Family History  Problem Relation Age of Onset  . Heart attack Mother   . Coronary artery disease Mother   . Heart disease Mother        Carotid Stenosis and BPG and Heart Disease before age 32  . Diabetes Mother   . Hypertension Mother   . Heart attack Father   . Heart disease Father        BPG and Heart Disease before age 40  . Hypertension Father   . Cancer Father 55       throat  . Stroke Father   . Colon cancer Neg Hx   . Colon polyps Neg Hx   . Esophageal cancer Neg Hx   . Rectal cancer Neg Hx   . Stomach  cancer Neg Hx    Social History   Socioeconomic History  . Marital status: Married    Spouse name: Not on file  . Number of children: 1  . Years of education: Not on file  . Highest education level: Bachelor's degree (e.g., BA, AB, BS)  Occupational History  . Not on file  Social Needs  . Financial resource strain: Not hard at all  . Food insecurity    Worry: Never true    Inability: Never true  . Transportation needs    Medical: No    Non-medical: No  Tobacco Use  . Smoking status: Former Smoker    Packs/day: 1.25    Years: 35.00    Pack years: 43.75    Types: Cigarettes    Quit date: 02/28/2005    Years since quitting: 13.5  . Smokeless tobacco: Current User  Types: Snuff  Substance and Sexual Activity  . Alcohol use: Yes    Alcohol/week: 8.0 - 10.0 standard drinks    Types: 8 - 10 Glasses of wine per week  . Drug use: No  . Sexual activity: Yes  Lifestyle  . Physical activity    Days per week: 3 days    Minutes per session: 60 min  . Stress: Not at all  Relationships  . Social connections    Talks on phone: More than three times a week    Gets together: Three times a week    Attends religious service: Never    Active member of club or organization: Yes    Attends meetings of clubs or organizations: More than 4 times per year    Relationship status: Married  Other Topics Concern  . Not on file  Social History Narrative  . Not on file    Outpatient Encounter Medications as of 09/12/2018  Medication Sig  . Alirocumab (PRALUENT) 150 MG/ML SOAJ Inject 1 pen into the skin every 14 (fourteen) days.  Marland Kitchen amLODipine (NORVASC) 5 MG tablet Take 1 tablet (5 mg total) by mouth daily.  Marland Kitchen aspirin EC 81 MG tablet Take 1 tablet (81 mg total) by mouth daily.  . chlorhexidine (PERIDEX) 0.12 % solution RINSE 15 ML S TWICE DAILY AFTER BREAKFAST/BEFORE BEDTIME FOLLOWING BRUSHING AND FLOSSING  . clopidogrel (PLAVIX) 75 MG tablet Take 1 tablet (75 mg total) by mouth daily.  .  cyclobenzaprine (FLEXERIL) 10 MG tablet Take 1 tablet (10 mg total) by mouth 3 (three) times daily as needed for muscle spasms.  Marland Kitchen HYDROcodone-acetaminophen (NORCO/VICODIN) 5-325 MG tablet Take 1 tablet by mouth every 6 (six) hours as needed for severe pain.  Marland Kitchen lisinopril (PRINIVIL,ZESTRIL) 20 MG tablet Take 1 tablet (20 mg total) by mouth daily.  . meloxicam (MOBIC) 7.5 MG tablet Take 1 tablet (7.5 mg total) by mouth daily. (Patient taking differently: Take 7.5 mg by mouth as needed. )  . metoprolol succinate (TOPROL-XL) 25 MG 24 hr tablet Take 1 tablet (25 mg total) by mouth daily.  . nitroGLYCERIN (NITROSTAT) 0.4 MG SL tablet Place 1 tablet (0.4 mg total) under the tongue every 5 (five) minutes as needed for chest pain.  . pantoprazole (PROTONIX) 40 MG tablet TAKE 1 TABLET BY MOUTH EVERY DAY  . traMADol (ULTRAM) 50 MG tablet Take 1 tablet (50 mg total) by mouth daily as needed for moderate pain.  . [DISCONTINUED] AMBULATORY NON FORMULARY MEDICATION Take 180 mg by mouth daily. Medication Name: bempedoic acid vs placebo. CLEAR Research study drug provided (Patient not taking: Reported on 09/05/2018)  . [DISCONTINUED] aspirin EC 81 MG tablet Take 1 tablet (81 mg total) by mouth daily.  . [DISCONTINUED] clopidogrel (PLAVIX) 75 MG tablet Take 1 tablet (75 mg total) by mouth daily.  . [DISCONTINUED] cyclobenzaprine (FLEXERIL) 10 MG tablet Take 1 tablet (10 mg total) by mouth 3 (three) times daily as needed for muscle spasms.  . [DISCONTINUED] pantoprazole (PROTONIX) 40 MG tablet Take 1 tablet (40 mg total) by mouth daily.  . [DISCONTINUED] traMADol (ULTRAM) 50 MG tablet Take 1 tablet (50 mg total) by mouth every 6 (six) hours as needed for moderate pain.   No facility-administered encounter medications on file as of 09/12/2018.     Activities of Daily Living In your present state of health, do you have any difficulty performing the following activities: 09/12/2018 06/04/2018  Hearing? N -  Comment  declines hearing aids -  Vision?  N -  Difficulty concentrating or making decisions? N -  Walking or climbing stairs? N -  Dressing or bathing? N -  Doing errands, shopping? N N  Preparing Food and eating ? N -  Using the Toilet? N -  In the past six months, have you accidently leaked urine? N -  Do you have problems with loss of bowel control? N -  Managing your Medications? N -  Managing your Finances? N -  Housekeeping or managing your Housekeeping? N -  Some recent data might be hidden    Patient Care Team: Glean Hess, MD as PCP - General (Internal Medicine) End, Harrell Gave, MD as PCP - Cardiology (Cardiology) Ladene Artist, MD as Consulting Physician (Gastroenterology) Chari Manning, MD as Attending Physician (Orthopedic Surgery) Eulogio Bear, MD as Consulting Physician (Ophthalmology) Lucky Cowboy Erskine Squibb, MD as Referring Physician (Vascular Surgery) End, Harrell Gave, MD as Consulting Physician (Cardiology)   Assessment:   This is a routine wellness examination for [place].  Exercise Activities and Dietary recommendations Current Exercise Habits: Home exercise routine, Type of exercise: Other - see comments(golf), Time (Minutes): 60, Frequency (Times/Week): 3, Weekly Exercise (Minutes/Week): 180, Intensity: Moderate, Exercise limited by: None identified  Goals    . LDL CALC < 70     Given baseline LDL b/t 160-200 mg/dL, would like to see LDL at least 80 mg/dL, but prefer < 70 mg/dL       Fall Risk Fall Risk  09/12/2018 09/06/2017 05/01/2017 01/06/2017 07/13/2016  Falls in the past year? 0 No No No No  Number falls in past yr: 0 - - - -  Injury with Fall? 0 - - - -  Follow up Falls prevention discussed - - - -   FALL RISK PREVENTION PERTAINING TO THE HOME:  Any stairs in or around the home? Yes  If so, do they handrails? Yes   Home free of loose throw rugs in walkways, pet beds, electrical cords, etc? Yes  Adequate lighting in your home to reduce risk of  falls? Yes   ASSISTIVE DEVICES UTILIZED TO PREVENT FALLS:  Life alert? No  Use of a cane, walker or w/c? No  Grab bars in the bathroom? Yes  Shower chair or bench in shower? No  Elevated toilet seat or a handicapped toilet? No   DME ORDERS:  DME order needed?  No   TIMED UP AND GO:  Was the test performed? Yes .  Length of time to ambulate 10 feet: 5 sec.   GAIT:  Appearance of gait: Gait stead-fast and without the use of an assistive device.   Education: Fall risk prevention has been discussed.  Intervention(s) required? No    Depression Screen PHQ 2/9 Scores 09/12/2018 09/06/2017 05/01/2017 01/06/2017  PHQ - 2 Score 0 0 0 0  PHQ- 9 Score - 0 1 0    Cognitive Function     6CIT Screen 09/12/2018 09/06/2017 07/13/2016  What Year? 0 points 0 points 0 points  What month? 0 points 0 points 0 points  What time? 0 points 0 points -  Count back from 20 0 points 0 points -  Months in reverse 0 points 0 points -  Repeat phrase 0 points 4 points -  Total Score 0 4 -    Immunization History  Administered Date(s) Administered  . Influenza, High Dose Seasonal PF 12/23/2016, 12/12/2017  . Influenza-Unspecified 12/15/2015, 12/23/2016  . Pneumococcal Conjugate-13 06/15/2015  . Pneumococcal Polysaccharide-23 01/06/2017  .  Tdap 04/18/2012    Qualifies for Shingles Vaccine? Yes . Due for Shingrix. Education has been provided regarding the importance of this vaccine. Pt has been advised to call insurance company to determine out of pocket expense. Advised may also receive vaccine at local pharmacy or Health Dept. Verbalized acceptance and understanding.  Tdap: Up to date  Flu Vaccine: Up to date  Pneumococcal Vaccine: Up to date   Screening Tests Health Maintenance  Topic Date Due  . Hepatitis C Screening  1950-10-21  . COLONOSCOPY  05/21/2018  . INFLUENZA VACCINE  09/29/2018  . TETANUS/TDAP  04/18/2022  . PNA vac Low Risk Adult  Completed   Cancer Screenings:   Colorectal Screening: Completed 05/20/08. Repeat every 10 years; Scheduled for colonoscopy 09/13/18  Lung Cancer Screening: (Low Dose CT Chest recommended if Age 69-80 years, 30 pack-year currently smoking OR have quit w/in 15years.) does qualify. Pt has had previous screening, will be contact for follow up.  Additional Screening:  Hepatitis C Screening: does qualify; postponed  Vision Screening: Recommended annual ophthalmology exams for early detection of glaucoma and other disorders of the eye. Is the patient up to date with their annual eye exam?  No  Who is the provider or what is the name of the office in which the pt attends annual eye exams? [place] Screening: Recommended annual dental exams for proper oral hygiene  Community Resource Referral:  CRR required this visit?  No       Plan:    I have personally reviewed and addressed the Medicare Annual Wellness questionnaire and have noted the following in the patient's chart:  A. Medical and social history B. Use of alcohol, tobacco or illicit drugs  C. Current medications and supplements D. Functional ability and status E.  Nutritional status F.  Physical activity G. Advance directives H. List of other physicians I.  Hospitalizations, surgeries, and ER visits in previous 12 months J.  [place] such as hearing and vision if needed, cognitive and depression L. Referrals and appointments   In addition, I have reviewed and discussed with patient certain preventive protocols, quality metrics, and best practice recommendations. A written personalized care plan for preventive services as well as general preventive health recommendations were provided to patient.   Signed,  [name], LPN Nurse Health Advisor   Nurse Notes:  Pt doing well and appreciative of visit today. He c/o rash on left lower inner gumline that was treated with amoxicillin and chlorhexidine rinse prescribed by dentist. Pt  has completed antibiotics but still feels a few little bumps in that area. Small rash visible less than 1 cm. Gum tissue appeared normal and pt denied pain. Advised to contact dentist if sxs worsen or persist.

## 2018-09-13 ENCOUNTER — Encounter
Admission: RE | Disposition: A | Discharge status: Home / Self Care | Payer: Self-pay | Source: Home / Self Care | Attending: Gastroenterology

## 2018-09-13 ENCOUNTER — Ambulatory Visit: Payer: PPO | Admitting: Anesthesiology

## 2018-09-13 ENCOUNTER — Ambulatory Visit
Admission: RE | Admit: 2018-09-13 | Discharge: 2018-09-13 | Disposition: A | Discharge status: Home / Self Care | Payer: PPO | Attending: Gastroenterology | Admitting: Gastroenterology

## 2018-09-13 DIAGNOSIS — Z7982 Long term (current) use of aspirin: Secondary | ICD-10-CM | POA: Insufficient documentation

## 2018-09-13 DIAGNOSIS — I739 Peripheral vascular disease, unspecified: Secondary | ICD-10-CM | POA: Diagnosis not present

## 2018-09-13 DIAGNOSIS — I10 Essential (primary) hypertension: Secondary | ICD-10-CM | POA: Diagnosis not present

## 2018-09-13 DIAGNOSIS — M19041 Primary osteoarthritis, right hand: Secondary | ICD-10-CM | POA: Insufficient documentation

## 2018-09-13 DIAGNOSIS — K219 Gastro-esophageal reflux disease without esophagitis: Secondary | ICD-10-CM | POA: Diagnosis not present

## 2018-09-13 DIAGNOSIS — K5730 Diverticulosis of large intestine without perforation or abscess without bleeding: Secondary | ICD-10-CM | POA: Diagnosis not present

## 2018-09-13 DIAGNOSIS — D122 Benign neoplasm of ascending colon: Secondary | ICD-10-CM | POA: Diagnosis not present

## 2018-09-13 DIAGNOSIS — K621 Rectal polyp: Secondary | ICD-10-CM

## 2018-09-13 DIAGNOSIS — M19042 Primary osteoarthritis, left hand: Secondary | ICD-10-CM | POA: Insufficient documentation

## 2018-09-13 DIAGNOSIS — Z79899 Other long term (current) drug therapy: Secondary | ICD-10-CM | POA: Diagnosis not present

## 2018-09-13 DIAGNOSIS — K449 Diaphragmatic hernia without obstruction or gangrene: Secondary | ICD-10-CM | POA: Insufficient documentation

## 2018-09-13 DIAGNOSIS — I25110 Atherosclerotic heart disease of native coronary artery with unstable angina pectoris: Secondary | ICD-10-CM | POA: Diagnosis not present

## 2018-09-13 DIAGNOSIS — E785 Hyperlipidemia, unspecified: Secondary | ICD-10-CM | POA: Diagnosis not present

## 2018-09-13 DIAGNOSIS — K640 First degree hemorrhoids: Secondary | ICD-10-CM | POA: Insufficient documentation

## 2018-09-13 DIAGNOSIS — Z1211 Encounter for screening for malignant neoplasm of colon: Secondary | ICD-10-CM | POA: Diagnosis not present

## 2018-09-13 DIAGNOSIS — I252 Old myocardial infarction: Secondary | ICD-10-CM | POA: Diagnosis not present

## 2018-09-13 DIAGNOSIS — Z955 Presence of coronary angioplasty implant and graft: Secondary | ICD-10-CM | POA: Diagnosis not present

## 2018-09-13 DIAGNOSIS — D123 Benign neoplasm of transverse colon: Secondary | ICD-10-CM | POA: Insufficient documentation

## 2018-09-13 DIAGNOSIS — K635 Polyp of colon: Secondary | ICD-10-CM

## 2018-09-13 DIAGNOSIS — Z87891 Personal history of nicotine dependence: Secondary | ICD-10-CM | POA: Diagnosis not present

## 2018-09-13 HISTORY — PX: COLONOSCOPY WITH PROPOFOL: SHX5780

## 2018-09-13 HISTORY — PX: POLYPECTOMY: SHX5525

## 2018-09-13 SURGERY — COLONOSCOPY WITH PROPOFOL
Anesthesia: General | Site: Rectum

## 2018-09-13 MED ORDER — STERILE WATER FOR IRRIGATION IR SOLN
Status: DC | PRN
  Administered 2018-09-13: 15 mL

## 2018-09-13 MED ORDER — PROPOFOL 10 MG/ML IV BOLUS
INTRAVENOUS | Status: DC | PRN
  Administered 2018-09-13: 30 mg via INTRAVENOUS
  Administered 2018-09-13 (×2): 20 mg via INTRAVENOUS
  Administered 2018-09-13: 70 mg via INTRAVENOUS
  Administered 2018-09-13: 20 mg via INTRAVENOUS
  Administered 2018-09-13: 30 mg via INTRAVENOUS
  Administered 2018-09-13: 10 mg via INTRAVENOUS

## 2018-09-13 MED ORDER — LACTATED RINGERS IV SOLN
INTRAVENOUS | Status: DC
  Administered 2018-09-13: 10:00:00 via INTRAVENOUS

## 2018-09-13 MED ORDER — OXYCODONE HCL 5 MG/5ML PO SOLN: 5.0000 mg | Freq: Once | ORAL | Status: DC | PRN

## 2018-09-13 MED ORDER — SODIUM CHLORIDE 0.9 % IV SOLN: INTRAVENOUS | Status: DC

## 2018-09-13 MED ORDER — OXYCODONE HCL 5 MG PO TABS: 5.0000 mg | ORAL_TABLET | Freq: Once | ORAL | Status: DC | PRN

## 2018-09-13 SURGICAL SUPPLY — 8 items
CANISTER SUCT 1200ML W/VALVE (MISCELLANEOUS) ×2 IMPLANT
FORCEPS BIOP RAD 4 LRG CAP 4 (CUTTING FORCEPS) ×1 IMPLANT
GOWN CVR UNV OPN BCK APRN NK (MISCELLANEOUS) ×2 IMPLANT
GOWN ISOL THUMB LOOP REG UNIV (MISCELLANEOUS) ×4
KIT ENDO PROCEDURE OLY (KITS) ×2 IMPLANT
SNARE SHORT THROW 13M SML OVAL (MISCELLANEOUS) ×1 IMPLANT
TRAP ETRAP POLY (MISCELLANEOUS) ×1 IMPLANT
WATER STERILE IRR 250ML POUR (IV SOLUTION) ×2 IMPLANT

## 2018-09-13 NOTE — Anesthesia Procedure Notes (Signed)
Performed by: Kelechi Orgeron, CRNA Pre-anesthesia Checklist: Patient identified, Emergency Drugs available, Suction available, Timeout performed and Patient being monitored Patient Re-evaluated:Patient Re-evaluated prior to induction Oxygen Delivery Method: Nasal cannula Placement Confirmation: positive ETCO2       

## 2018-09-13 NOTE — H&P (Signed)
Ronald Lame, MD [facility] [address], [place], [place] [address] Phone: [phone] Fax : [phone]  Primary Care Physician:  Glean Hess, MD Primary Gastroenterologist:  Dr. Allen Norris  Pre-Procedure History & Physical: HPI:  [name] is a 68 y.o. male is here for a screening colonoscopy.   Past Medical History:  Diagnosis Date  . Abnormal nuclear cardiac imaging test 08/08/2015  . Arthritis    fingers  . Carotid artery occlusion   . Coronary atherosclerosis of native coronary artery 01/29/2013   stent  . Duodenal erosion   . Encounter for screening for lung cancer 07/13/2016  . Esophageal stenosis    esophageal dilation  . GERD (gastroesophageal reflux disease)   . H. pylori infection   . Heart attack [facility] Oct. 2009   Mild  . Hiatal hernia   . Hyperlipidemia   . Hypertension   . 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 Cardiac catheterization-nonobstructive 60% RCA disease-subsequent nuclear stress test-9 minutes, low risk, mild inferior wall hypokinesis   . Pain in limb 12/19/2017  . Peripheral vascular disease ([place])   . Unstable angina ([place]) 11/25/2017    Past Surgical History:  Procedure Laterality Date  . APPENDECTOMY    . BACK SURGERY    . CARDIAC CATHETERIZATION N/A 08/07/2015   Procedure: Left Heart Cath and Coronary Angiography;  Surgeon: [name], MD;  Location: [place] [facility];  Service: Cardiovascular;  Laterality: N/A;  . CARDIAC CATHETERIZATION N/A 08/07/2015   Procedure: Coronary Stent Intervention;  Surgeon: [name], MD;  Location: [place] [facility];  Service: Cardiovascular;  Laterality: N/A;  . CARDIAC CATHETERIZATION N/A 08/07/2015   Procedure: Coronary Stent Intervention;  Surgeon: [name], MD;  Location: [place] [facility];  Service: Cardiovascular;  Laterality: N/A;  . CAROTID ENDARTERECTOMY  01/05/2006   Right  CEA with DPA  . CATARACT EXTRACTION W/  INTRAOCULAR LENS IMPLANT Left 12/04/2017  . CATARACT EXTRACTION W/PHACO Left 12/04/2017   Procedure: CATARACT EXTRACTION PHACO AND INTRAOCULAR LENS PLACEMENT ([place]) LEFT;  Surgeon: Eulogio Bear, MD;  Location: [place];  Service: Ophthalmology;  Laterality: Left;  . CATARACT EXTRACTION W/PHACO Right 02/06/2018   Procedure: CATARACT EXTRACTION PHACO AND INTRAOCULAR LENS PLACEMENT ([facility])RIGHT;  Surgeon: Eulogio Bear, MD;  Location: [place];  Service: Ophthalmology;  Laterality: Right;  . COLONOSCOPY  05/20/2008  . CORONARY STENT PLACEMENT  08/07/2015   [facility] [place]  . HIP SURGERY Left 10/2016   left hip tendon repair  . LEFT HEART CATH AND CORONARY ANGIOGRAPHY N/A 11/27/2017   Procedure: LEFT HEART CATH AND CORONARY ANGIOGRAPHY;  Surgeon: Wellington Hampshire, MD;  Location: [place] [facility];  Service: Cardiovascular;  Laterality: N/A;  . LOWER EXTREMITY ANGIOGRAPHY Left 02/12/2018   Procedure: LOWER EXTREMITY ANGIOGRAPHY;  Surgeon: Algernon Huxley, MD;  Location: [place] [facility];  Service: Cardiovascular;  Laterality: Left;  . LOWER EXTREMITY ANGIOGRAPHY Left 03/07/2018   Procedure: LOWER EXTREMITY ANGIOGRAPHY;  Surgeon: Algernon Huxley, MD;  Location: [place] [facility];  Service: Cardiovascular;  Laterality: Left;  . LOWER EXTREMITY ANGIOGRAPHY Left 06/04/2018   Procedure: LOWER EXTREMITY ANGIOGRAPHY;  Surgeon: Algernon Huxley, MD;  Location: [place] [facility];  Service: Cardiovascular;  Laterality: Left;  . SPINE SURGERY    . TONSILLECTOMY      Prior to Admission medications   Medication Sig Start Date End Date Taking? Authorizing Provider  Alirocumab (PRALUENT) 150 MG/ML SOAJ Inject 1 pen into the skin every 14 (fourteen) days. 08/13/18  Yes End, Harrell Gave, MD  amLODipine (NORVASC) 5 MG tablet Take 1 tablet (5 mg total) by mouth daily. 12/07/17  Yes End, Harrell Gave, MD  aspirin EC 81 MG tablet Take 1 tablet (81 mg total) by mouth daily. 06/05/18  Yes  Stegmayer, Janalyn Harder, PA-C  chlorhexidine (PERIDEX) 0.12 % solution RINSE 15 ML S TWICE DAILY AFTER BREAKFAST/BEFORE BEDTIME FOLLOWING BRUSHING AND FLOSSING 08/13/18  Yes [provider]  clopidogrel (PLAVIX) 75 MG tablet Take 1 tablet (75 mg total) by mouth daily. 06/05/18  Yes Stegmayer, Janalyn Harder, PA-C  HYDROcodone-acetaminophen (NORCO/VICODIN) 5-325 MG tablet Take 1 tablet by mouth every 6 (six) hours as needed for severe pain. 06/05/18  Yes Stegmayer, Joelene Millin A, PA-C  lisinopril (PRINIVIL,ZESTRIL) 20 MG tablet Take 1 tablet (20 mg total) by mouth daily. 02/19/18  Yes [name], MD  meloxicam (MOBIC) 7.5 MG tablet Take 1 tablet (7.5 mg total) by mouth daily. Patient taking differently: Take 7.5 mg by mouth as needed.  05/17/18  Yes Glean Hess, MD  metoprolol succinate (TOPROL-XL) 25 MG 24 hr tablet Take 1 tablet (25 mg total) by mouth daily. 12/07/17  Yes End, Harrell Gave, MD  nitroGLYCERIN (NITROSTAT) 0.4 MG SL tablet Place 1 tablet (0.4 mg total) under the tongue every 5 (five) minutes as needed for chest pain. 08/08/15  Yes Kilroy, Luke K, PA-C  pantoprazole (PROTONIX) 40 MG tablet TAKE 1 TABLET BY MOUTH EVERY DAY 05/21/18  Yes Ladene Artist, MD  traMADol (ULTRAM) 50 MG tablet Take 1 tablet (50 mg total) by mouth daily as needed for moderate pain. 06/22/18 09/20/18 Yes Glean Hess, MD  cyclobenzaprine (FLEXERIL) 10 MG tablet Take 1 tablet (10 mg total) by mouth 3 (three) times daily as needed for muscle spasms. 06/14/18   Glean Hess, MD    Allergies as of 06/20/2018 - Review Complete 06/04/2018  Allergen Reaction Noted  . Brilinta [ticagrelor]  08/24/2015  . Zetia [ezetimibe] Other (See Comments) 01/29/2013  . Statins Other (See Comments) 01/29/2013    Family History  Problem Relation Age of Onset  . Heart attack Mother   . Coronary artery disease Mother   . Heart disease Mother        Carotid Stenosis and BPG and Heart Disease before age 43  . Diabetes  Mother   . Hypertension Mother   . Heart attack Father   . Heart disease Father        BPG and Heart Disease before age 75  . Hypertension Father   . Cancer Father 55       throat  . Stroke Father   . Colon cancer Neg Hx   . Colon polyps Neg Hx   . Esophageal cancer Neg Hx   . Rectal cancer Neg Hx   . Stomach cancer Neg Hx     Social History   Socioeconomic History  . Marital status: Married    Spouse name: Not on file  . Number of children: 1  . Years of education: Not on file  . Highest education level: Bachelor's degree (e.g., BA, AB, BS)  Occupational History  . Not on file  Social Needs  . Financial resource strain: Not hard at all  . Food insecurity    Worry: Never true    Inability: Never true  . Transportation needs    Medical: No    Non-medical: No  Tobacco Use  . Smoking status: Former Smoker    Packs/day: 1.25    Years: 35.00    Pack years: 43.75    Types: Cigarettes    Quit date: 02/28/2005    Years since quitting: 13.5  . Smokeless tobacco: Current User    Types: Snuff  Substance and Sexual Activity  . Alcohol use: Yes    Alcohol/week: 8.0 - 10.0 standard drinks    Types: 8 - 10 Glasses of wine per week  . Drug use: No  . Sexual activity: Yes  Lifestyle  . Physical activity    Days per week: 3 days    Minutes per session: 60 min  . Stress: Not at all  Relationships  . Social connections    Talks on phone: More than three times a week    Gets together: Three times a week    Attends religious service: Never    Active member of club or organization: Yes    Attends meetings of clubs or organizations: More than 4 times per year    Relationship status: Married  . Intimate partner violence    Fear of current or ex partner: No    Emotionally abused: No    Physically abused: No    Forced sexual activity: No  Other Topics Concern  . Not on file  Social History Narrative  . Not on file    Review of Systems: See HPI, otherwise negative ROS   Physical Exam: BP (!) 162/99   Pulse 68   Temp (!) 97.5 F (36.4 C) (Temporal)   Resp 16   Ht 5\' 6"  (1.676 m)   Wt 77.6 kg   SpO2 98%   BMI 27.60 kg/m  General:   Alert,  pleasant and cooperative in NAD Head:  Normocephalic and atraumatic. Neck:  Supple; no masses or thyromegaly. Lungs:  Clear throughout to auscultation.    Heart:  Regular rate and rhythm. Abdomen:  Soft, nontender and nondistended. Normal bowel sounds, without guarding, and without rebound.   Neurologic:  Alert and  oriented x4;  grossly normal neurologically.  Impression/Plan: [name] is now here to undergo a screening colonoscopy.  Risks, benefits, and alternatives regarding colonoscopy have been reviewed with the patient.  Questions have been answered.  All parties agreeable.

## 2018-09-13 NOTE — Anesthesia Postprocedure Evaluation (Signed)
Anesthesia Post Note  Patient: [name] Lie  Procedure(s) Performed: COLONOSCOPY WITH BIOPSY (N/A Rectum) POLYPECTOMY (N/A Rectum)  Patient location during evaluation: PACU Anesthesia Type: General Level of consciousness: awake and alert Pain management: pain level controlled Vital Signs Assessment: post-procedure vital signs reviewed and stable Respiratory status: spontaneous breathing Cardiovascular status: stable Anesthetic complications: no    Jaci Standard, III,  Dellar Traber D

## 2018-09-13 NOTE — Op Note (Signed)
[facility] Gastroenterology Patient Name: [name] Procedure Date: 09/13/2018 10:14 AM MRN: [id number] Account #: [id number] Date of Birth: 11/23/1950 Admit Type: Outpatient Age: 68 Room: [facility] OR ROOM 01 Gender: Male Note Status: Finalized Procedure:            Colonoscopy Indications:          Screening for colorectal malignant neoplasm Providers:            [name] MD, MD Referring MD:         Halina Maidens, MD (Referring MD) Medicines:            Propofol per Anesthesia Complications:        No immediate complications. Procedure:            Pre-Anesthesia Assessment:                       - Prior to the procedure, a History and Physical was                        performed, and patient medications and allergies were                        reviewed. The patient's tolerance of previous                        anesthesia was also reviewed. The risks and benefits of                        the procedure and the sedation options and risks were                        discussed with the patient. All questions were                        answered, and informed consent was obtained. Prior                        Anticoagulants: The patient has taken no previous                        anticoagulant or antiplatelet agents. ASA Grade                        Assessment: II - A patient with mild systemic disease.                        After reviewing the risks and benefits, the patient was                        deemed in satisfactory condition to undergo the                        procedure.                       After obtaining informed consent, the colonoscope was                        passed under direct vision. Throughout the procedure,  the patient's blood pressure, pulse, and oxygen                        saturations were monitored continuously. The was                        introduced through the anus and advanced to the the              cecum, identified by appendiceal orifice and ileocecal                        valve. The colonoscopy was performed without                        difficulty. The patient tolerated the procedure well.                        The quality of the bowel preparation was excellent. Findings:      The perianal and digital rectal examinations were normal.      A 2 mm polyp was found in the ascending colon. The polyp was sessile.       The polyp was removed with a cold biopsy forceps. Resection and       retrieval were complete.      Two sessile polyps were found in the ascending colon. The polyps were 4       to 5 mm in size. These polyps were removed with a cold snare. Resection       and retrieval were complete.      A 4 mm polyp was found in the transverse colon. The polyp was sessile.       The polyp was removed with a cold snare. Resection and retrieval were       complete.      A 6 mm polyp was found in the rectum. The polyp was sessile. The polyp       was removed with a cold snare. Resection and retrieval were complete.      Multiple small-mouthed diverticula were found in the sigmoid colon.      Non-bleeding internal hemorrhoids were found during retroflexion. The       hemorrhoids were Grade I (internal hemorrhoids that do not prolapse). Impression:           - One 2 mm polyp in the ascending colon, removed with a                        cold biopsy forceps. Resected and retrieved.                       - Two 4 to 5 mm polyps in the ascending colon, removed                        with a cold snare. Resected and retrieved.                       - One 4 mm polyp in the transverse colon, removed with                        a cold snare. Resected and retrieved.                       -  One 6 mm polyp in the rectum, removed with a cold                        snare. Resected and retrieved.                       - Diverticulosis in the sigmoid colon.                       -  Non-bleeding internal hemorrhoids. Recommendation:       - Discharge patient to home.                       - Resume previous diet.                       - Continue present medications.                       - Await pathology results.                       - Repeat colonoscopy in 5 years if polyp adenoma and 10                        years if hyperplastic Procedure Code(s):    --- Professional ---                       [phone], Colonoscopy, flexible; with removal of tumor(s),                        polyp(s), or other lesion(s) by snare technique                       45380, 31, Colonoscopy, flexible; with biopsy, single                        or multiple Diagnosis Code(s):    --- Professional ---                       Z12.11, Encounter for screening for malignant neoplasm                        of colon                       K62.1, Rectal polyp                       K63.5, Polyp of colon CPT copyright 2019 American Medical Association. All rights reserved. The codes documented in this report are preliminary and upon coder review may  be revised to meet current compliance requirements. [name] MD, MD 09/13/2018 11:01:09 AM This report has been signed electronically. Number of Addenda: 0 Note Initiated On: 09/13/2018 10:14 AM Scope Withdrawal Time: 0 hours 8 minutes 20 seconds  Total Procedure Duration: 0 hours 10 minutes 22 seconds  Estimated Blood Loss: Estimated blood loss: none.      [facility]

## 2018-09-13 NOTE — Anesthesia Preprocedure Evaluation (Signed)
Anesthesia Evaluation  Patient identified by MRN, date of birth, ID band Patient awake    Reviewed: Allergy & Precautions, H&P , NPO status , Patient's Chart, lab work & pertinent test results  History of Anesthesia Complications Negative for: history of anesthetic complications  Airway Mallampati: I  TM Distance: >3 FB Neck ROM: full    Dental no notable dental hx.    Pulmonary former smoker,    Pulmonary exam normal breath sounds clear to auscultation       Cardiovascular hypertension, + CAD and + Past MI  Normal cardiovascular exam Rhythm:regular Rate:Normal  Stents placed about a year ago- no recent CP/SOB.   Neuro/Psych negative neurological ROS     GI/Hepatic Neg liver ROS, hiatal hernia, GERD  ,  Endo/Other  negative endocrine ROS  Renal/GU negative Renal ROS  negative genitourinary   Musculoskeletal   Abdominal   Peds  Hematology negative hematology ROS (+)   Anesthesia Other Findings   Reproductive/Obstetrics                             Anesthesia Physical Anesthesia Plan  ASA: III  Anesthesia Plan: General   Post-op Pain Management:    Induction:   PONV Risk Score and Plan:   Airway Management Planned:   Additional Equipment:   Intra-op Plan:   Post-operative Plan:   Informed Consent: I have reviewed the patients History and Physical, chart, labs and discussed the procedure including the risks, benefits and alternatives for the proposed anesthesia with the patient or authorized representative who has indicated his/her understanding and acceptance.       Plan Discussed with:   Anesthesia Plan Comments:         Anesthesia Quick Evaluation

## 2018-09-13 NOTE — Transfer of Care (Signed)
Immediate Anesthesia Transfer of Care Note  Patient: [name]  Procedure(s) Performed: COLONOSCOPY WITH BIOPSY (N/A Rectum) POLYPECTOMY (N/A Rectum)  Patient Location: PACU  Anesthesia Type: General  Level of Consciousness: awake, alert  and patient cooperative  Airway and Oxygen Therapy: Patient Spontanous Breathing and Patient connected to supplemental oxygen  Post-op Assessment: Post-op Vital signs reviewed, Patient's Cardiovascular Status Stable, Respiratory Function Stable, Patent Airway and No signs of Nausea or vomiting  Post-op Vital Signs: Reviewed and stable  Complications: No apparent anesthesia complications

## 2018-09-14 ENCOUNTER — Encounter: Payer: Self-pay | Admitting: Gastroenterology

## 2018-09-17 ENCOUNTER — Encounter: Payer: Self-pay | Admitting: Gastroenterology

## 2018-09-18 ENCOUNTER — Encounter: Payer: Self-pay | Admitting: Gastroenterology

## 2018-09-25 ENCOUNTER — Telehealth: Payer: Self-pay | Admitting: *Deleted

## 2018-09-25 NOTE — Telephone Encounter (Signed)
Patient has been notified that lung cancer screening CT scan is due currently or will be in near future. Confirmed that patient is within the appropriate age range, and asymptomatic, (no signs or symptoms of lung cancer). Patient denies illness that would prevent curative treatment for lung cancer if found. Verified smoking history ( Former Smoker, patient states that he quit approximately 13 yrs ago). Patient is agreeable for CT scan being scheduled but will need to wait until the first of October.

## 2018-11-04 ENCOUNTER — Other Ambulatory Visit: Payer: Self-pay | Admitting: Internal Medicine

## 2018-11-04 DIAGNOSIS — M5134 Other intervertebral disc degeneration, thoracic region: Secondary | ICD-10-CM

## 2018-11-08 ENCOUNTER — Telehealth: Payer: Self-pay | Admitting: Internal Medicine

## 2018-11-08 NOTE — Chronic Care Management (AMB) (Signed)
Chronic Care Management   Note  11/08/2018 Name: [name] MRN: [id number] DOB: [birth year]  Elyn Aquas Quayle is a 68 y.o. year old male who is a primary care patient of Glean Hess, MD. I reached out to Vickki Muff by phone today in response to a referral sent by [name] health plan.    [name] was given information about Chronic Care Management services today including:  1. CCM service includes personalized support from designated clinical staff supervised by his physician, including individualized plan of care and coordination with other care providers 2. 24/7 contact phone numbers for assistance for urgent and routine care needs. 3. Service will only be billed when office clinical staff spend 20 minutes or more in a month to coordinate care. 4. Only one practitioner may furnish and bill the service in a calendar month. 5. The patient may stop CCM services at any time (effective at the end of the month) by phone call to the office staff. 6. The patient will be responsible for cost sharing (co-pay) of up to 20% of the service fee (after annual deductible is met).  Patient agreed to services and verbal consent obtained.   Follow up plan: Telephone appointment with CCM team member scheduled for: 11/30/2018  [place]  [email or web address]   ??[phone]

## 2018-11-08 NOTE — Chronic Care Management (AMB) (Signed)
Chronic Care Management   Note  11/08/2018 Name: CHARBEL LOS MRN: [id number] DOB: [birth year]  Elyn Aquas Bocchino is a 68 y.o. year old male who is a primary care patient of Glean Hess, MD. I reached out to Vickki Muff by phone today in response to a referral sent by Mr. RAZIEL [employer]ENIG[employer] health plan.    Mr. Rasmusson was given information about Chronic Care Management services today including:  1. CCM service includes personalized support from designated clinical staff supervised by his physician, including individualized plan of care and coordination with other care providers 2. 24/7 contact phone numbers for assistance for urgent and routine care needs. 3. Service will only be billed when office clinical staff spend 20 minutes or more in a month to coordinate care. 4. Only one practitioner may furnish and bill the service in a calendar month. 5. The patient may stop CCM services at any time (effective at the end of the month) by phone call to the office staff. 6. The patient will be responsible for cost sharing (co-pay) of up to 20% of the service fee (after annual deductible is met).  Patient agreed to services and verbal consent obtained.   Follow up plan: Telephone appointment with CCM team member scheduled for: 11/29/2018  [place]  [email or web address]   ??[phone]

## 2018-11-08 NOTE — Addendum Note (Signed)
Addended by: Clerance Lav on: 11/08/2018 10:14 AM   Modules accepted: Level of Service, SmartSet

## 2018-11-08 NOTE — Telephone Encounter (Signed)
This encounter was created in error - please disregard.

## 2018-11-08 NOTE — Addendum Note (Signed)
Addended by: Clerance Lav on: 11/08/2018 01:05 PM   Modules accepted: Level of Service, SmartSet

## 2018-11-09 ENCOUNTER — Telehealth: Payer: Self-pay | Admitting: Internal Medicine

## 2018-11-09 ENCOUNTER — Telehealth: Payer: Self-pay | Admitting: *Deleted

## 2018-11-09 NOTE — Telephone Encounter (Signed)
[employer] has received your information, and the request will be reviewed. You may close this dialog, return to your dashboard, and perform other tasks.  You will receive an electronic determination in CoverMyMeds. You can see the latest determination by locating this request on your dashboard or by reopening this request. You will also receive a faxed copy of the determination. If you have any questions please contact [employer] at [phone].

## 2018-11-09 NOTE — Telephone Encounter (Signed)
PA has been submitted through Covermymeds awaiting approval.

## 2018-11-09 NOTE — Telephone Encounter (Signed)
Patient praluent due and refills unavailable .  Needs prior auth .  See June 2020 note expires august 2020

## 2018-11-09 NOTE — Telephone Encounter (Signed)
[employer] has received your information, and the request will be reviewed. You may close this dialog, return to your dashboard, and perform other tasks.  You will receive an electronic determination in CoverMyMeds. You can see the latest determination by locating this request on your dashboard or by reopening this request. You will also receive a faxed copy of the determination. If you have any questions please contact [employer] at [phone].

## 2018-11-12 NOTE — Telephone Encounter (Signed)
Patient notified the Praluent 150 MG/ML is approved through 11/10/2019.  Told the patient to contact his pharmacy for when the [place] will be available to pick up.

## 2018-11-16 IMAGING — CT CT CHEST LUNG CANCER SCREENING LOW DOSE W/O CM
1 of 2 series · 15 of 40 positions shown, 19 images · non-contrast
Comparison: 07/26/2016

ADDENDUM:
5 mm right perifissural nodule is stable since 07/26/2016. As such,
this exam is Lung-RADS 2, benign appearance or behavior. Continue
annual screening with low-dose chest CT without contrast in 12
months.
CLINICAL DATA: 66-year-old male with 48 pack-year history of
smoking. Lung cancer screening.

EXAM:
CT CHEST WITHOUT CONTRAST LOW-DOSE FOR LUNG CANCER SCREENING
TECHNIQUE: Multidetector CT imaging of the chest was performed following the
standard protocol without IV contrast.

[Series 2: axial st · axial · 0.75mm/px · z∈[-487,-247]mm · 15 of 52 slices shown, 19 images]
[im 2/52  mediastinal]
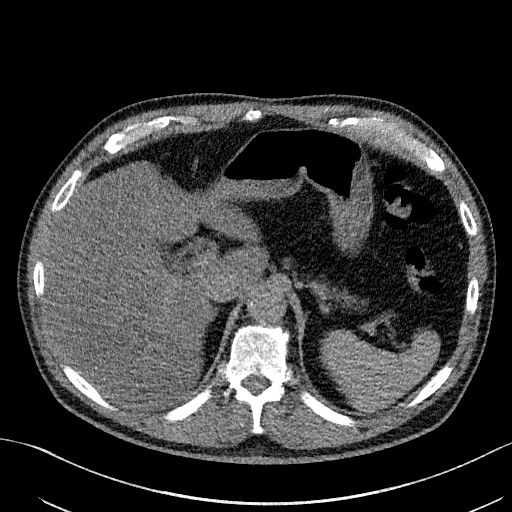
[im 2/52  lung]
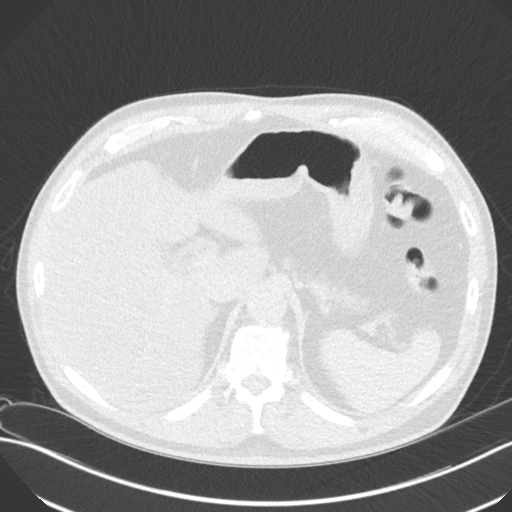
[im 6/52  lung]
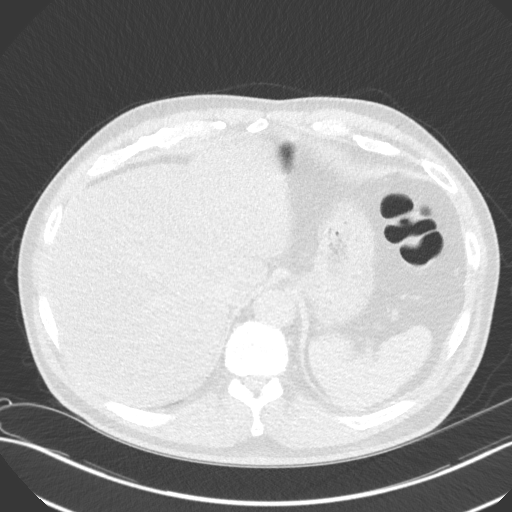
[im 10/52  lung]
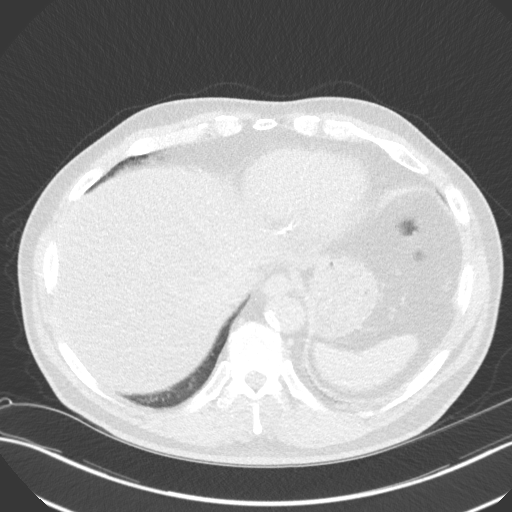
[im 13/52  lung]
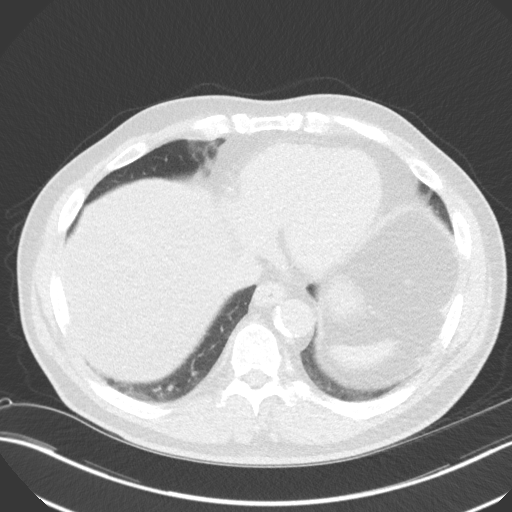
[im 16/52  mediastinal]
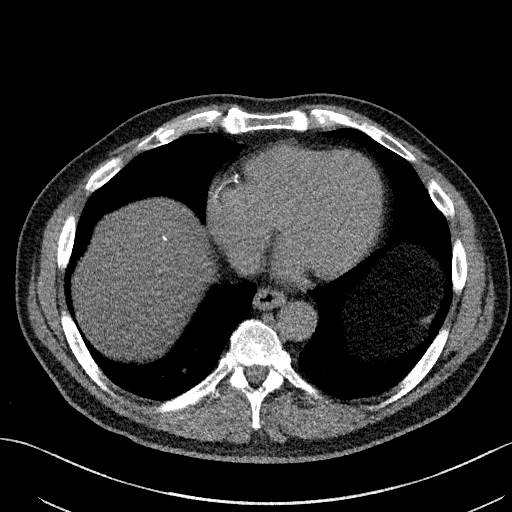
[im 16/52  lung]
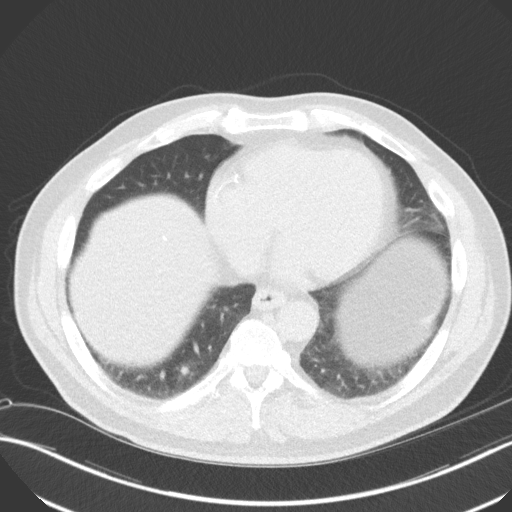
[im 20/52  lung]
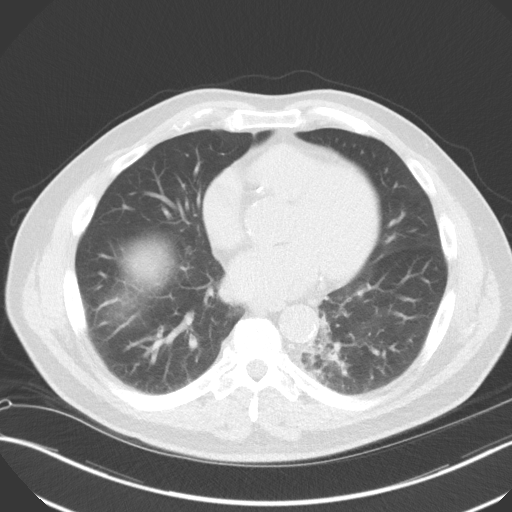
[im 24/52  lung]
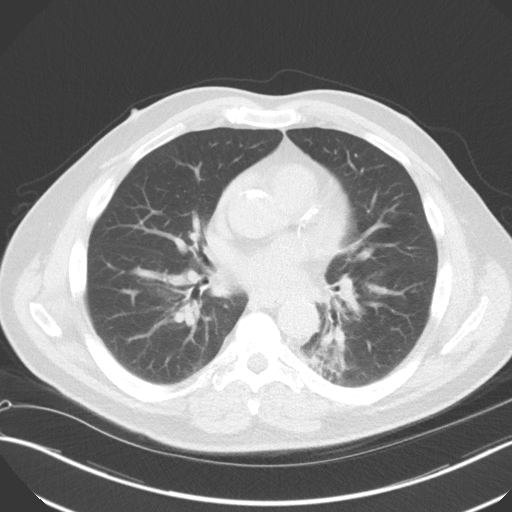
[im 26/52  lung]
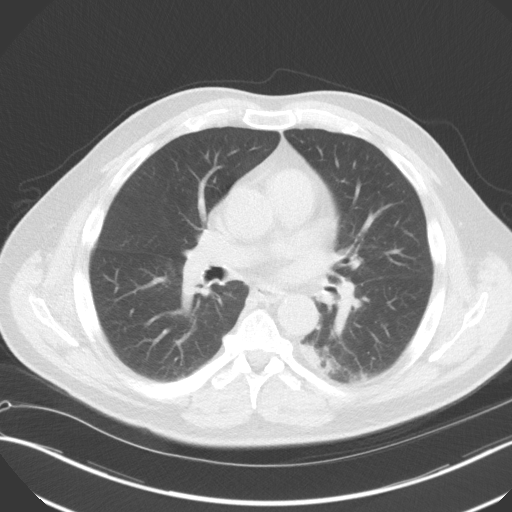
[im 28/52  mediastinal]
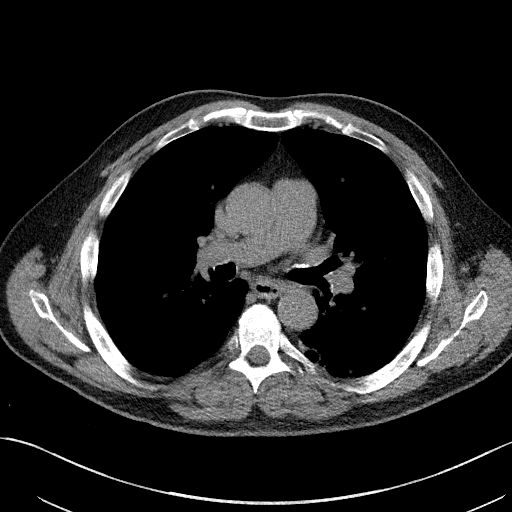
[im 28/52  lung]
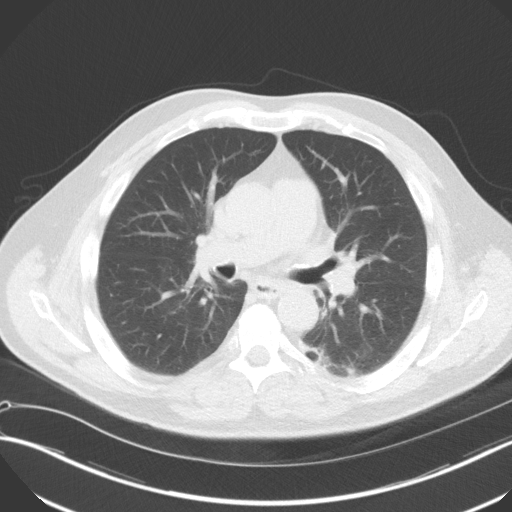
[im 32/52  lung]
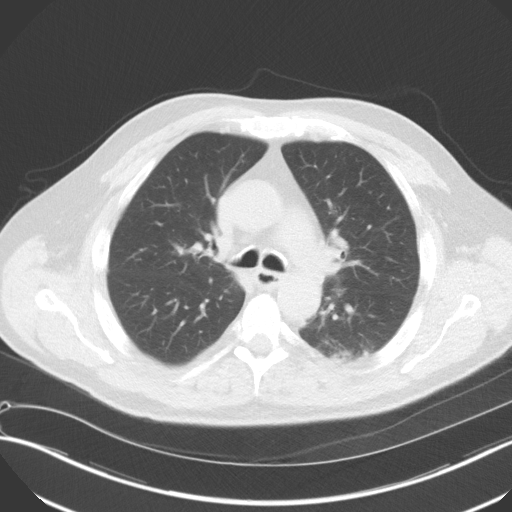
[im 36/52  lung]
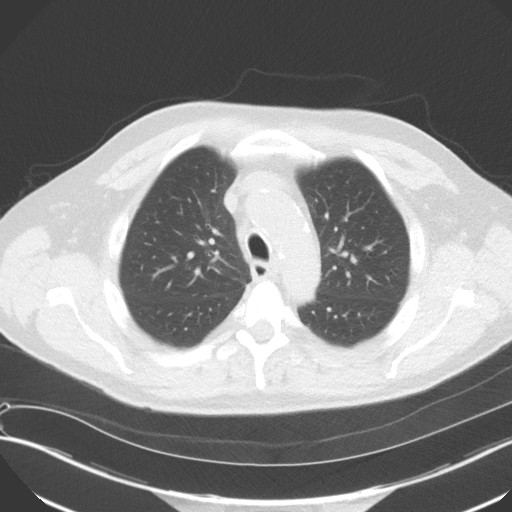
[im 39/52  lung]
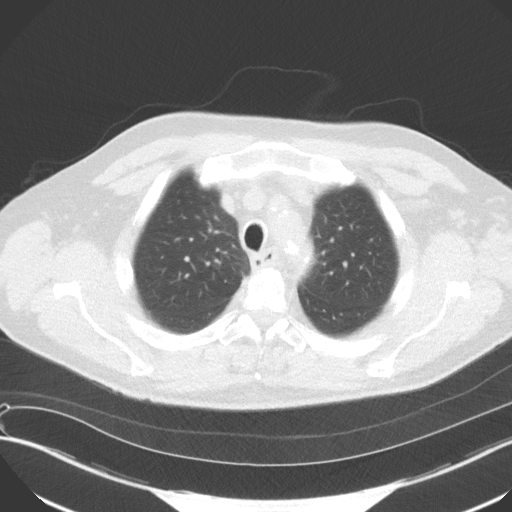
[im 42/52  mediastinal]
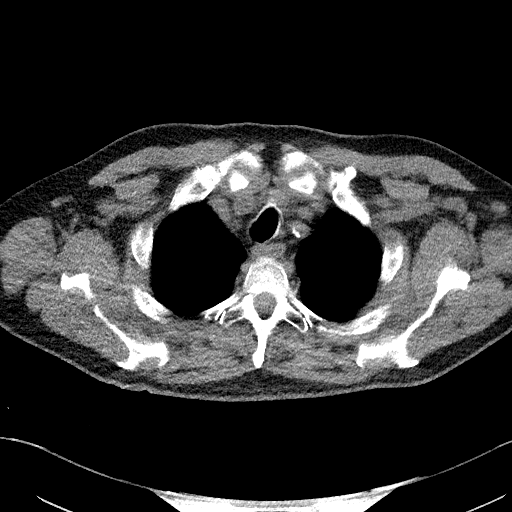
[im 42/52  lung]
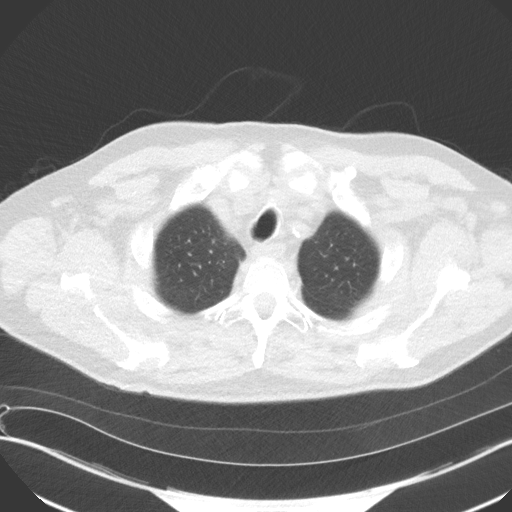
[im 46/52  lung]
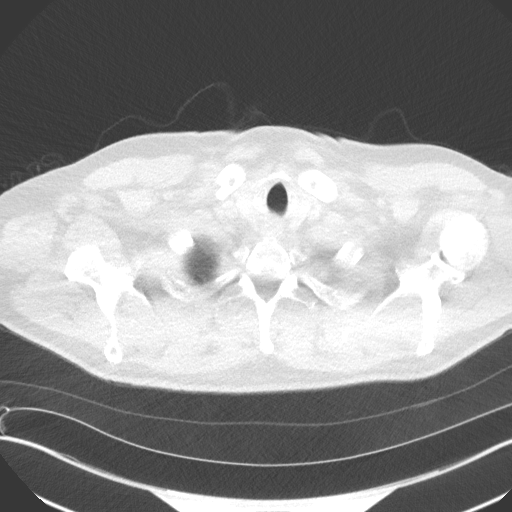
[im 50/52  lung]
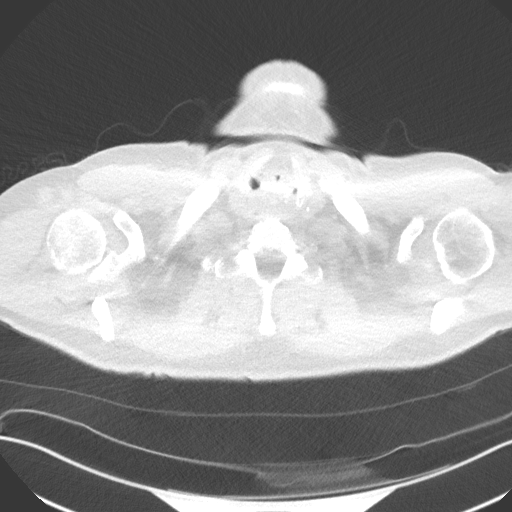

[15 of 40 positions shown; findings below may reference images not displayed]

FINDINGS: Cardiovascular: The heart size is normal. No substantial pericardial
effusion. Coronary artery calcification is evident. Atherosclerotic
calcification is noted in the wall of the thoracic aorta.

Mediastinum/Nodes: No mediastinal lymphadenopathy. No evidence for
gross hilar lymphadenopathy although assessment is limited by the
lack of intravenous contrast on today's study. The esophagus has
normal imaging features. There is no axillary lymphadenopathy.

Lungs/Pleura: The central tracheobronchial airways are patent. Right
perifissural nodule has volume derived equivalent diameter 5 mm
(image 158). Patchy and streaky opacity is identified in the medial
aspect of the left lower lobe with posterior retraction of the major
fissure, suggesting atelectasis. No pleural effusion.

Upper Abdomen: The liver shows diffusely decreased attenuation
suggesting steatosis.

Musculoskeletal: No worrisome lytic or sclerotic osseous
abnormality.
IMPRESSION: 1. Right perifissural nodule with volume derived equivalent diameter
5 mm. Lung-RADS 3, probably benign findings. Short-term follow-up in
6 months is recommended with repeat low-dose chest CT without
contrast (please use the following order, "CT CHEST LCS NODULE
FOLLOW-UP W/O CM").
2. Streaky and patchy opacity in the medial left lower lobe with
evidence of associated volume loss. Imaging appearance likely
related to atelectasis with underlying infection/inflammation not
entirely excluded. Reassessment at the time of follow-up CT
recommended.
3.  Aortic Atherosclerois ([id number]-170.0)

## 2018-11-26 ENCOUNTER — Telehealth: Payer: Self-pay

## 2018-11-26 DIAGNOSIS — E785 Hyperlipidemia, unspecified: Secondary | ICD-10-CM

## 2018-11-26 NOTE — Telephone Encounter (Signed)
Patient returned called he would like to go to the [facility] location to get blood work done.

## 2018-11-26 NOTE — Telephone Encounter (Signed)
Called and lmomed the pt regarding the need of lipid labs and instructed the pt to call us back asap to get scheduled. Labs ordered

## 2018-11-27 NOTE — Telephone Encounter (Signed)
CALLED TO SCHEDULE LABS IN [facility] AND THE PT'S WIFE STATED THAT THEY ARE COMPLAINT FOR October 12TH

## 2018-11-27 NOTE — Addendum Note (Signed)
Addended by: Allean Found on: 11/27/2018 09:15 AM   Modules accepted: Orders

## 2018-11-29 ENCOUNTER — Other Ambulatory Visit: Payer: Self-pay

## 2018-11-29 ENCOUNTER — Telehealth: Payer: PPO

## 2018-11-29 MED ORDER — PANTOPRAZOLE SODIUM 40 MG PO TBEC: 40.0000 mg | DELAYED_RELEASE_TABLET | Freq: Every day | ORAL | 0 refills | Status: DC

## 2018-11-30 ENCOUNTER — Encounter: Payer: Self-pay | Admitting: *Deleted

## 2018-11-30 ENCOUNTER — Telehealth: Payer: PPO

## 2018-12-03 NOTE — Telephone Encounter (Signed)
Left message for patient to notify them that it is time to schedule annual low dose lung cancer screening CT scan. Instructed patient to call back to verify information prior to the scan being scheduled.  

## 2018-12-04 ENCOUNTER — Telehealth: Payer: Self-pay | Admitting: *Deleted

## 2018-12-04 DIAGNOSIS — Z122 Encounter for screening for malignant neoplasm of respiratory organs: Secondary | ICD-10-CM

## 2018-12-04 DIAGNOSIS — Z87891 Personal history of nicotine dependence: Secondary | ICD-10-CM

## 2018-12-04 NOTE — Telephone Encounter (Signed)
Patient has been notified that annual lung cancer screening low dose CT scan is due currently or will be in near future. Confirmed that patient is within the age range of 55-77, and asymptomatic, (no signs or symptoms of lung cancer). Patient denies illness that would prevent curative treatment for lung cancer if found. Verified smoking history, (former, quit 2007, 43.75 pack year). The shared decision making visit was done 09/19/17. Patient is agreeable for CT scan being scheduled.

## 2018-12-10 ENCOUNTER — Other Ambulatory Visit: Payer: Self-pay

## 2018-12-10 ENCOUNTER — Other Ambulatory Visit (INDEPENDENT_AMBULATORY_CARE_PROVIDER_SITE_OTHER): Payer: PPO

## 2018-12-10 ENCOUNTER — Other Ambulatory Visit: Payer: Self-pay | Admitting: *Deleted

## 2018-12-10 DIAGNOSIS — Z79899 Other long term (current) drug therapy: Secondary | ICD-10-CM | POA: Diagnosis not present

## 2018-12-10 DIAGNOSIS — E785 Hyperlipidemia, unspecified: Secondary | ICD-10-CM | POA: Diagnosis not present

## 2018-12-11 ENCOUNTER — Other Ambulatory Visit: Payer: Self-pay

## 2018-12-11 ENCOUNTER — Ambulatory Visit
Admission: RE | Admit: 2018-12-11 | Discharge: 2018-12-11 | Disposition: A | Discharge status: Home / Self Care | Payer: PPO | Source: Ambulatory Visit | Attending: Nurse Practitioner | Admitting: Nurse Practitioner

## 2018-12-11 DIAGNOSIS — Z122 Encounter for screening for malignant neoplasm of respiratory organs: Secondary | ICD-10-CM | POA: Diagnosis not present

## 2018-12-11 DIAGNOSIS — Z87891 Personal history of nicotine dependence: Secondary | ICD-10-CM | POA: Diagnosis not present

## 2018-12-11 LAB — LIPID PANEL
Chol/HDL Ratio: 2.4 ratio (ref 0.0–5.0)
Cholesterol, Total: 144 mg/dL (ref 100–199)
HDL: 59 mg/dL (ref >39)
LDL Chol Calc (NIH): 68 mg/dL (ref 0–99)
Triglycerides: 88 mg/dL (ref 0–149)
VLDL Cholesterol Cal: 17 mg/dL (ref 5–40)

## 2019-01-18 ENCOUNTER — Encounter (INDEPENDENT_AMBULATORY_CARE_PROVIDER_SITE_OTHER): Payer: Self-pay | Admitting: Nurse Practitioner

## 2019-01-18 ENCOUNTER — Other Ambulatory Visit: Payer: Self-pay

## 2019-01-18 ENCOUNTER — Other Ambulatory Visit (INDEPENDENT_AMBULATORY_CARE_PROVIDER_SITE_OTHER): Payer: PPO

## 2019-01-18 ENCOUNTER — Ambulatory Visit (INDEPENDENT_AMBULATORY_CARE_PROVIDER_SITE_OTHER): Payer: PPO | Admitting: Nurse Practitioner

## 2019-01-18 VITALS — BP 115/70 | HR 68 | Resp 16 | Wt 179.8 lb

## 2019-01-18 DIAGNOSIS — I6523 Occlusion and stenosis of bilateral carotid arteries: Secondary | ICD-10-CM

## 2019-01-18 DIAGNOSIS — E785 Hyperlipidemia, unspecified: Secondary | ICD-10-CM | POA: Diagnosis not present

## 2019-01-18 DIAGNOSIS — I739 Peripheral vascular disease, unspecified: Secondary | ICD-10-CM

## 2019-01-18 NOTE — Progress Notes (Signed)
SUBJECTIVE:  Patient ID: [name], male    DOB: 31-Aug-1950, 68 y.o.   MRN: [id number] Chief Complaint  Patient presents with  . Follow-up    ultrasound follow up    HPI  [name] is a 68 y.o. male The patient returns to the office for followup and review of the noninvasive studies. There have been no interval changes in lower extremity symptoms. No interval shortening of the patient's claudication distance or development of rest pain symptoms. No new ulcers or wounds have occurred since the last visit.  There have been no significant changes to the patient's overall health care.  Patient continues to utilize oral tobacco.  The patient denies amaurosis fugax or recent TIA symptoms. There are no recent neurological changes noted. The patient denies history of DVT, PE or superficial thrombophlebitis. The patient denies recent episodes of angina or shortness of breath.   ABI Rt=1.0 and Lt=0.96  (previous ABI's Rt=1.20 and Lt=1.26) Duplex ultrasound of the bilateral tibial arteries had triphasic waveforms. Past Medical History:  Diagnosis Date  . Abnormal nuclear cardiac imaging test 08/08/2015  . Arthritis    fingers  . Carotid artery occlusion   . Coronary atherosclerosis of native coronary artery 01/29/2013   stent  . Duodenal erosion   . Encounter for screening for lung cancer 07/13/2016  . Esophageal stenosis    esophageal dilation  . GERD (gastroesophageal reflux disease)   . H. pylori infection   . Heart attack [facility]) Oct. 2009   Mild  . Hiatal hernia   . Hyperlipidemia   . Hypertension   . 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 Cardiac catheterization-nonobstructive 60% RCA disease-subsequent nuclear stress test-9 minutes, low risk, mild inferior wall hypokinesis   . Pain in limb 12/19/2017  . Peripheral vascular disease ([place])   . Unstable angina ([place]) 11/25/2017    Past Surgical History:  Procedure  Laterality Date  . APPENDECTOMY    . BACK SURGERY    . CARDIAC CATHETERIZATION N/A 08/07/2015   Procedure: Left Heart Cath and Coronary Angiography;  Surgeon: [name], MD;  Location: [place] [facility] LAB;  Service: Cardiovascular;  Laterality: N/A;  . CARDIAC CATHETERIZATION N/A 08/07/2015   Procedure: Coronary Stent Intervention;  Surgeon: [name], MD;  Location: [place] [facility] LAB;  Service: Cardiovascular;  Laterality: N/A;  . CARDIAC CATHETERIZATION N/A 08/07/2015   Procedure: Coronary Stent Intervention;  Surgeon: [name], MD;  Location: [place] [facility];  Service: Cardiovascular;  Laterality: N/A;  . CAROTID ENDARTERECTOMY  01/05/2006   Right  CEA with DPA  . CATARACT EXTRACTION W/ INTRAOCULAR LENS IMPLANT Left 12/04/2017  . CATARACT EXTRACTION W/PHACO Left 12/04/2017   Procedure: CATARACT EXTRACTION PHACO AND INTRAOCULAR LENS PLACEMENT ([place]) LEFT;  Surgeon: Eulogio Bear, MD;  Location: [place];  Service: Ophthalmology;  Laterality: Left;  . CATARACT EXTRACTION W/PHACO Right 02/06/2018   Procedure: CATARACT EXTRACTION PHACO AND INTRAOCULAR LENS PLACEMENT ([facility])RIGHT;  Surgeon: Eulogio Bear, MD;  Location: [place];  Service: Ophthalmology;  Laterality: Right;  . COLONOSCOPY  05/20/2008  . COLONOSCOPY WITH PROPOFOL N/A 09/13/2018   Procedure: COLONOSCOPY WITH BIOPSY;  Surgeon: [name], MD;  Location: [place];  Service: Endoscopy;  Laterality: N/A;  . CORONARY STENT PLACEMENT  08/07/2015   MID CIRCUMFLEX  . HIP SURGERY Left 10/2016   left hip tendon repair  . LEFT HEART CATH AND CORONARY ANGIOGRAPHY  N/A 11/27/2017   Procedure: LEFT HEART CATH AND CORONARY ANGIOGRAPHY;  Surgeon: Wellington Hampshire, MD;  Location: [place] [facility];  Service: Cardiovascular;  Laterality: N/A;  . LOWER EXTREMITY ANGIOGRAPHY Left 02/12/2018   Procedure: LOWER EXTREMITY ANGIOGRAPHY;  Surgeon: Algernon Huxley, MD;  Location: [place] [facility];   Service: Cardiovascular;  Laterality: Left;  . LOWER EXTREMITY ANGIOGRAPHY Left 03/07/2018   Procedure: LOWER EXTREMITY ANGIOGRAPHY;  Surgeon: Algernon Huxley, MD;  Location: [place] [facility];  Service: Cardiovascular;  Laterality: Left;  . LOWER EXTREMITY ANGIOGRAPHY Left 06/04/2018   Procedure: LOWER EXTREMITY ANGIOGRAPHY;  Surgeon: Algernon Huxley, MD;  Location: [place] [facility];  Service: Cardiovascular;  Laterality: Left;  . POLYPECTOMY N/A 09/13/2018   Procedure: POLYPECTOMY;  Surgeon: [name], MD;  Location: [place];  Service: Endoscopy;  Laterality: N/A;  . SPINE SURGERY    . TONSILLECTOMY      Social History   Socioeconomic History  . Marital status: Married    Spouse name: Not on file  . Number of children: 1  . Years of education: Not on file  . Highest education level: Bachelor's degree (e.g., BA, AB, BS)  Occupational History  . Not on file  Social Needs  . Financial resource strain: Not hard at all  . Food insecurity    Worry: Never true    Inability: Never true  . Transportation needs    Medical: No    Non-medical: No  Tobacco Use  . Smoking status: Former Smoker    Packs/day: 1.25    Years: 35.00    Pack years: 43.75    Types: Cigarettes    Quit date: 02/28/2005    Years since quitting: 13.8  . Smokeless tobacco: Current User    Types: Snuff  Substance and Sexual Activity  . Alcohol use: Yes    Alcohol/week: 8.0 - 10.0 standard drinks    Types: 8 - 10 Glasses of wine per week  . Drug use: No  . Sexual activity: Yes  Lifestyle  . Physical activity    Days per week: 3 days    Minutes per session: 60 min  . Stress: Not at all  Relationships  . Social connections    Talks on phone: More than three times a week    Gets together: Three times a week    Attends religious service: Never    Active member of club or organization: Yes    Attends meetings of clubs or organizations: More than 4 times per year    Relationship status: Married  .  Intimate partner violence    Fear of current or ex partner: No    Emotionally abused: No    Physically abused: No    Forced sexual activity: No  Other Topics Concern  . Not on file  Social History Narrative  . Not on file    Family History  Problem Relation Age of Onset  . Heart attack Mother   . Coronary artery disease Mother   . Heart disease Mother        Carotid Stenosis and BPG and Heart Disease before age 65  . Diabetes Mother   . Hypertension Mother   . Heart attack Father   . Heart disease Father        BPG and Heart Disease before age 28  . Hypertension Father   . Cancer Father 55       throat  . Stroke Father   .  Colon cancer Neg Hx   . Colon polyps Neg Hx   . Esophageal cancer Neg Hx   . Rectal cancer Neg Hx   . Stomach cancer Neg Hx     Allergies  Allergen Reactions  . Brilinta [Ticagrelor]     Shortness of breath  . Zetia [Ezetimibe] Other (See Comments)    Muscle aches  . Statins Other (See Comments)    Failed Crestor 5 mg twice weekly, Crestor 20 mg daily, Pravastatin 40 mg qd, Lipitor, Zocor - muscle aches     Review of Systems   Review of Systems: Negative Unless Checked Constitutional: [] Weight loss  [] Fever  [] Chills Cardiac: [] Chest pain   []  Atrial Fibrillation  [] Palpitations   [] Shortness of breath when laying flat   [] Shortness of breath with exertion. [] Shortness of breath at rest Vascular:  [] Pain in legs with walking   [] Pain in legs with standing [] Pain in legs when laying flat   [] Claudication    [] Pain in feet when laying flat    [] History of DVT   [] Phlebitis   [] Swelling in legs   [] Varicose veins   [] Non-healing ulcers Pulmonary:   [] Uses home oxygen   [] Productive cough   [] Hemoptysis   [] Wheeze  [] COPD   [] Asthma Neurologic:  [] Dizziness   [] Seizures  [] Blackouts [] History of stroke   [] History of TIA  [] Aphasia   [] Temporary Blindness   [] Weakness or numbness in arm   [] Weakness or numbness in leg Musculoskeletal:   [] Joint  swelling   [] Joint pain   [] Low back pain  []  History of Knee Replacement [x] Arthritis [] back Surgeries  []  Spinal Stenosis    Hematologic:  [] Easy bruising  [] Easy bleeding   [] Hypercoagulable state   [] Anemic Gastrointestinal:  [] Diarrhea   [] Vomiting  [x] Gastroesophageal reflux/heartburn   [] Difficulty swallowing. [] Abdominal pain Genitourinary:  [] Chronic kidney disease   [] Difficult urination  [] Anuric   [] Blood in urine [] Frequent urination  [] Burning with urination   [] Hematuria Skin:  [] Rashes   [] Ulcers [] Wounds Psychological:  [] History of anxiety   []  History of major depression  []  Memory Difficulties      OBJECTIVE:   Physical Exam  BP 115/70 (BP Location: Right Arm)   Pulse 68   Resp 16   Wt 179 lb 12.8 oz (81.6 kg)   BMI 29.02 kg/m   Gen: WD/WN, NAD Head: [place]/AT, No temporalis wasting.  Ear/Nose/Throat: Hearing grossly intact, nares w/o erythema or drainage Eyes: PER, EOMI, sclera nonicteric.  Neck: Supple, no masses.  No JVD.  Pulmonary:  Good air movement, no use of accessory muscles.  Cardiac: RRR Vascular:  Vessel Right Left  Radial Palpable Palpable  Dorsalis Pedis Palpable Palpable  Posterior Tibial Palpable Palpable   Gastrointestinal: soft, non-distended. No guarding/no peritoneal signs.  Musculoskeletal: M/S 5/5 throughout.  No deformity or atrophy.  Neurologic: Pain and light touch intact in extremities.  Symmetrical.  Speech is fluent. Motor exam as listed above. Psychiatric: Judgment intact, Mood & affect appropriate for pt's clinical situation. Dermatologic: No Venous rashes. No Ulcers Noted.  No changes consistent with cellulitis. Lymph : No Cervical lymphadenopathy, no lichenification or skin changes of chronic lymphedema.       ASSESSMENT AND PLAN:  1. PAD (peripheral artery disease) (HCC)  Recommend:  The patient has evidence of atherosclerosis of the lower extremities with claudication.  The patient does not voice lifestyle limiting  changes at this point in time.  Noninvasive studies do not suggest clinically significant change.  No  invasive studies, angiography or surgery at this time The patient should continue walking and begin a more formal exercise program.  The patient should continue antiplatelet therapy and aggressive treatment of the lipid abnormalities  Patient advised to stop using oral tobacco.  No changes in the patient's medications at this time  The patient should continue wearing graduated compression socks 10-15 mmHg strength to control the mild edema.    2. Bilateral carotid artery stenosis Currently doing well without any signs or issues.  We will have noninvasive studies when the patient returns.  3. Hyperlipidemia LDL goal <70 Continue statin as ordered and reviewed, no changes at this time    Current Outpatient Medications on File Prior to Visit  Medication Sig Dispense Refill  . Alirocumab (PRALUENT) 150 MG/ML SOAJ Inject 1 pen into the skin every 14 (fourteen) days. 2 pen 11  . amLODipine (NORVASC) 5 MG tablet Take 1 tablet (5 mg total) by mouth daily. 90 tablet 3  . aspirin EC 81 MG tablet Take 1 tablet (81 mg total) by mouth daily. 90 tablet 3  . clopidogrel (PLAVIX) 75 MG tablet Take 1 tablet (75 mg total) by mouth daily. 90 tablet 3  . cyclobenzaprine (FLEXERIL) 10 MG tablet Take 1 tablet (10 mg total) by mouth 3 (three) times daily as needed for muscle spasms. 60 tablet 5  . lisinopril (PRINIVIL,ZESTRIL) 20 MG tablet Take 1 tablet (20 mg total) by mouth daily. 90 tablet 2  . meloxicam (MOBIC) 7.5 MG tablet Take 1 tablet (7.5 mg total) by mouth daily. (Patient taking differently: Take 7.5 mg by mouth as needed. ) 90 tablet 0  . metoprolol succinate (TOPROL-XL) 25 MG 24 hr tablet Take 1 tablet (25 mg total) by mouth daily. 90 tablet 3  . nitroGLYCERIN (NITROSTAT) 0.4 MG SL tablet Place 1 tablet (0.4 mg total) under the tongue every 5 (five) minutes as needed for chest pain. 25 tablet 2   . pantoprazole (PROTONIX) 40 MG tablet Take 1 tablet (40 mg total) by mouth daily. 90 tablet 0  . traMADol (ULTRAM) 50 MG tablet Take 1 tablet (50 mg total) by mouth daily as needed for moderate pain. 30 tablet 0  . chlorhexidine (PERIDEX) 0.12 % solution RINSE 15 ML S TWICE DAILY AFTER BREAKFAST/BEFORE BEDTIME FOLLOWING BRUSHING AND FLOSSING    . HYDROcodone-acetaminophen (NORCO/VICODIN) 5-325 MG tablet Take 1 tablet by mouth every 6 (six) hours as needed for severe pain. (Patient not taking: Reported on 01/18/2019) 28 tablet 0   No current facility-administered medications on file prior to visit.     There are no Patient Instructions on file for this visit. No follow-ups on file.   Kris Hartmann, NP  This note was completed with Sales executive.  Any errors are purely unintentional.

## 2019-01-23 ENCOUNTER — Encounter: Payer: Self-pay | Admitting: Gastroenterology

## 2019-01-23 ENCOUNTER — Other Ambulatory Visit: Payer: Self-pay

## 2019-01-23 ENCOUNTER — Ambulatory Visit (INDEPENDENT_AMBULATORY_CARE_PROVIDER_SITE_OTHER): Payer: PPO | Admitting: Gastroenterology

## 2019-01-23 ENCOUNTER — Telehealth: Payer: Self-pay | Admitting: *Deleted

## 2019-01-23 VITALS — BP 122/67 | HR 70 | Temp 97.4°F | Ht 66.0 in | Wt 183.2 lb

## 2019-01-23 DIAGNOSIS — R1310 Dysphagia, unspecified: Secondary | ICD-10-CM

## 2019-01-23 DIAGNOSIS — R1319 Other dysphagia: Secondary | ICD-10-CM

## 2019-01-23 NOTE — H&P (View-Only) (Signed)
Primary Care Physician: Glean Hess, MD  Primary Gastroenterologist:  Dr. [name]  Chief Complaint  Patient presents with  . Dysphagia    HPI: [name] is a 68 y.o. male here history of dysphagia.  The patient reports that it can happen with things like potatoes and chicken but has also happened with peanuts.  The patient had an EGD by Dr. Alferd Apa in [place] about a year ago and was noted to have a 12 mm narrowing that was dilated up to 15 mm.  He reports that he felt much better after that was done until recently.  The patient states that his wife is concerned that it may be hot and cold drinks that are making his symptoms worse.  He states that he does not see a difference.  There is no report of any black stools bloody stools nausea or vomiting.  He does report that he has to bring up food when it gets stuck rarely.  Current Outpatient Medications  Medication Sig Dispense Refill  . Alirocumab (PRALUENT) 150 MG/ML SOAJ Inject 1 pen into the skin every 14 (fourteen) days. 2 pen 11  . amLODipine (NORVASC) 5 MG tablet Take 1 tablet (5 mg total) by mouth daily. 90 tablet 3  . aspirin EC 81 MG tablet Take 1 tablet (81 mg total) by mouth daily. 90 tablet 3  . clopidogrel (PLAVIX) 75 MG tablet Take 1 tablet (75 mg total) by mouth daily. 90 tablet 3  . cyclobenzaprine (FLEXERIL) 10 MG tablet Take 1 tablet (10 mg total) by mouth 3 (three) times daily as needed for muscle spasms. 60 tablet 5  . HYDROcodone-acetaminophen (NORCO/VICODIN) 5-325 MG tablet Take 1 tablet by mouth every 6 (six) hours as needed for severe pain. (Patient taking differently: Take 1 tablet by mouth every 6 (six) hours as needed for severe pain. AS NEEDED) 28 tablet 0  . lisinopril (PRINIVIL,ZESTRIL) 20 MG tablet Take 1 tablet (20 mg total) by mouth daily. 90 tablet 2  . meloxicam (MOBIC) 7.5 MG tablet Take 1 tablet (7.5 mg total) by mouth daily. (Patient taking differently: Take 7.5 mg by mouth as needed. ) 90  tablet 0  . metoprolol succinate (TOPROL-XL) 25 MG 24 hr tablet Take 1 tablet (25 mg total) by mouth daily. 90 tablet 3  . nitroGLYCERIN (NITROSTAT) 0.4 MG SL tablet Place 1 tablet (0.4 mg total) under the tongue every 5 (five) minutes as needed for chest pain. 25 tablet 2  . pantoprazole (PROTONIX) 40 MG tablet Take 1 tablet (40 mg total) by mouth daily. 90 tablet 0  . traMADol (ULTRAM) 50 MG tablet Take 1 tablet (50 mg total) by mouth daily as needed for moderate pain. 30 tablet 0   No current facility-administered medications for this visit.     Allergies as of 01/23/2019 - Review Complete 01/23/2019  Allergen Reaction Noted  . Brilinta [ticagrelor]  08/24/2015  . Zetia [ezetimibe] Other (See Comments) 01/29/2013  . Statins Other (See Comments) 01/29/2013    ROS:  General: Negative for anorexia, weight loss, fever, chills, fatigue, weakness. ENT: Negative for hoarseness, difficulty swallowing , nasal congestion. CV: Negative for chest pain, angina, palpitations, dyspnea on exertion, peripheral edema.  Respiratory: Negative for dyspnea at rest, dyspnea on exertion, cough, sputum, wheezing.  GI: See history of present illness. GU:  Negative for dysuria, hematuria, urinary incontinence, urinary frequency, nocturnal urination.  Endo: Negative for unusual weight change.    Physical Examination:   BP 122/67  Pulse 70   Temp (!) 97.4 F (36.3 C) (Temporal)   Ht 5\' 6"  (1.676 m)   Wt 183 lb 3.2 oz (83.1 kg)   BMI 29.57 kg/m   General: Well-nourished, well-developed in no acute distress.  Eyes: No icterus. Conjunctivae pink. Lungs: Clear to auscultation bilaterally. Non-labored. Heart: Regular rate and rhythm, no murmurs rubs or gallops.  Abdomen: Bowel sounds are normal, nontender, nondistended, no hepatosplenomegaly or masses, no abdominal bruits or hernia , no rebound or guarding.   Extremities: No lower extremity edema. No clubbing or deformities. Neuro: Alert and oriented x  3.  Grossly intact. Skin: Warm and dry, no jaundice.   Psych: Alert and cooperative, normal mood and affect.  Labs:    Imaging Studies: No results found.  Assessment and Plan:   [name] is a 68 y.o. y/o male who comes in with a history of dysphagia and a history of an esophageal stricture.  The patient is now having dysphagia again.  The patient will be set up for an EGD.  The patient is presently on Plavix and his prescriber will be contacted to see how [place] the patient can stay off the Plavix safely. I have discussed risks & benefits which include, but are not limited to, bleeding, infection, perforation & drug reaction.  The patient agrees with this plan & written consent will be obtained.        [name], MD. Marval Regal    Note: This dictation was prepared with Dragon dictation along with smaller phrase technology. Any transcriptional errors that result from this process are unintentional.

## 2019-01-23 NOTE — Progress Notes (Signed)
Primary Care Physician: Glean Hess, MD  Primary Gastroenterologist:  Dr. [name]  Chief Complaint  Patient presents with  . Dysphagia    HPI: [name] is a 68 y.o. male here history of dysphagia.  The patient reports that it can happen with things like potatoes and chicken but has also happened with peanuts.  The patient had an EGD by Dr. Alferd Apa in [place] about a year ago and was noted to have a 12 mm narrowing that was dilated up to 15 mm.  He reports that he felt much better after that was done until recently.  The patient states that his wife is concerned that it may be hot and cold drinks that are making his symptoms worse.  He states that he does not see a difference.  There is no report of any black stools bloody stools nausea or vomiting.  He does report that he has to bring up food when it gets stuck rarely.  Current Outpatient Medications  Medication Sig Dispense Refill  . Alirocumab (PRALUENT) 150 MG/ML SOAJ Inject 1 pen into the skin every 14 (fourteen) days. 2 pen 11  . amLODipine (NORVASC) 5 MG tablet Take 1 tablet (5 mg total) by mouth daily. 90 tablet 3  . aspirin EC 81 MG tablet Take 1 tablet (81 mg total) by mouth daily. 90 tablet 3  . clopidogrel (PLAVIX) 75 MG tablet Take 1 tablet (75 mg total) by mouth daily. 90 tablet 3  . cyclobenzaprine (FLEXERIL) 10 MG tablet Take 1 tablet (10 mg total) by mouth 3 (three) times daily as needed for muscle spasms. 60 tablet 5  . HYDROcodone-acetaminophen (NORCO/VICODIN) 5-325 MG tablet Take 1 tablet by mouth every 6 (six) hours as needed for severe pain. (Patient taking differently: Take 1 tablet by mouth every 6 (six) hours as needed for severe pain. AS NEEDED) 28 tablet 0  . lisinopril (PRINIVIL,ZESTRIL) 20 MG tablet Take 1 tablet (20 mg total) by mouth daily. 90 tablet 2  . meloxicam (MOBIC) 7.5 MG tablet Take 1 tablet (7.5 mg total) by mouth daily. (Patient taking differently: Take 7.5 mg by mouth as needed. ) 90  tablet 0  . metoprolol succinate (TOPROL-XL) 25 MG 24 hr tablet Take 1 tablet (25 mg total) by mouth daily. 90 tablet 3  . nitroGLYCERIN (NITROSTAT) 0.4 MG SL tablet Place 1 tablet (0.4 mg total) under the tongue every 5 (five) minutes as needed for chest pain. 25 tablet 2  . pantoprazole (PROTONIX) 40 MG tablet Take 1 tablet (40 mg total) by mouth daily. 90 tablet 0  . traMADol (ULTRAM) 50 MG tablet Take 1 tablet (50 mg total) by mouth daily as needed for moderate pain. 30 tablet 0   No current facility-administered medications for this visit.     Allergies as of 01/23/2019 - Review Complete 01/23/2019  Allergen Reaction Noted  . Brilinta [ticagrelor]  08/24/2015  . Zetia [ezetimibe] Other (See Comments) 01/29/2013  . Statins Other (See Comments) 01/29/2013    ROS:  General: Negative for anorexia, weight loss, fever, chills, fatigue, weakness. ENT: Negative for hoarseness, difficulty swallowing , nasal congestion. CV: Negative for chest pain, angina, palpitations, dyspnea on exertion, peripheral edema.  Respiratory: Negative for dyspnea at rest, dyspnea on exertion, cough, sputum, wheezing.  GI: See history of present illness. GU:  Negative for dysuria, hematuria, urinary incontinence, urinary frequency, nocturnal urination.  Endo: Negative for unusual weight change.    Physical Examination:   BP 122/67  Pulse 70   Temp (!) 97.4 F (36.3 C) (Temporal)   Ht 5\' 6"  (1.676 m)   Wt 183 lb 3.2 oz (83.1 kg)   BMI 29.57 kg/m   General: Well-nourished, well-developed in no acute distress.  Eyes: No icterus. Conjunctivae pink. Lungs: Clear to auscultation bilaterally. Non-labored. Heart: Regular rate and rhythm, no murmurs rubs or gallops.  Abdomen: Bowel sounds are normal, nontender, nondistended, no hepatosplenomegaly or masses, no abdominal bruits or hernia , no rebound or guarding.   Extremities: No lower extremity edema. No clubbing or deformities. Neuro: Alert and oriented x  3.  Grossly intact. Skin: Warm and dry, no jaundice.   Psych: Alert and cooperative, normal mood and affect.  Labs:    Imaging Studies: No results found.  Assessment and Plan:   [name] is a 68 y.o. y/o male who comes in with a history of dysphagia and a history of an esophageal stricture.  The patient is now having dysphagia again.  The patient will be set up for an EGD.  The patient is presently on Plavix and his prescriber will be contacted to see how [name] can stay off the Plavix safely. I have discussed risks & benefits which include, but are not limited to, bleeding, infection, perforation & drug reaction.  The patient agrees with this plan & written consent will be obtained.        [name], MD. Marval Regal    Note: This dictation was prepared with Dragon dictation along with smaller phrase technology. Any transcriptional errors that result from this process are unintentional.

## 2019-01-23 NOTE — Telephone Encounter (Signed)
   [facility] Pre-operative Risk Assessment    Request for surgical clearance:  1. What type of surgery is being performed? EGD   2. When is this surgery scheduled? 02/11/19   3. What type of clearance is required (medical clearance vs. Pharmacy clearance to hold med vs. Both)? MEDICAL  4. Are there any medications that need to be held prior to surgery and how Lysne? PLAVIX    5. Practice name and name of physician performing surgery? [facility]; DR. Evangeline Gula [facility]  6. What is your office phone number [phone]    7.   What is your office fax number [phone]  8.   Anesthesia type (None, local, MAC, general) ? NOT LISTED    [name] 01/23/2019, 3:49 PM  _________________________________________________________________   (provider comments below)

## 2019-01-28 NOTE — Telephone Encounter (Signed)
   Primary Cardiologist: Nelva Bush, MD  Chart reviewed as part of pre-operative protocol coverage. Patient has history of CAD with NSTEMI in 07/2015 s/p PCI to left circumflex, non-ischemic cardiomyopathy with EF of 40-45% on Echo in 01/2018, PAD s/p mechanical thrombectomy and catheter-directed thrombolysis to left SFA in 05/2018, carotid artery stenosis, hypertension, hyperlipidemia, hiatal hernia, and PUD. Patient was last seen by Dr. Saunders Revel via telehealth visit on 08/13/2018 at which time patient was doing well from a cardiac perspective. Called and spoke with patient today. He has been doing well from a cardiac standpoint since visit with Dr. Saunders Revel. He reports some minimal shortness of breath when laying back in a recliner but states this has been going on for a M[place]ill time. No chest pain, shortness of breath with activity, palpitations, lightheadedness, dizziness, presyncope/syncope or edema. He stays active by walking and playing golf and denies any exertional symptoms with this. Able to complete >4.0 METS. Given past medical history and time since last visit, based on ACC/AHA guidelines, [name] would be at acceptable risk for the planned procedure without further cardiovascular testing.   Per Dr. Lucky Cowboy [facility]): "I would prefer he stay on his aspirin and not stop this if possible. He has had recurrent ischemic issues when off antiplatelets in the past. The shorter period of time he can be off his Plavix and resume it the day after the procedure would be preferred. I think it would be safe for him to be off Plavix 3 days but if longer than that will be required, what ever needs to be done can be done and we will deal with any thrombotic consequences if they develop following the procedure."  I will route this recommendation to the requesting party via Epic fax function and remove from pre-op pool.  Please call with questions.  Ronald Mclean, PA-C 01/28/2019, 10:23 AM

## 2019-01-28 NOTE — Telephone Encounter (Signed)
   Primary Cardiologist: Nelva Bush, MD  Chart reviewed as part of pre-operative protocol coverage. Patient has history of CAD with NSTEMI in 07/2015 s/p PCI to left circumflex, non-ischemic cardiomyopathy with EF of 40-45% on Echo in 01/2018, PAD s/p mechanical thrombectomy and catheter-directed thrombolysis to left SFA in 05/2018, carotid artery stenosis, hypertension, hyperlipidemia, hiatal hernia, and PUD. Patient was last seen by Dr. Saunders Revel via telehealth visit on 08/13/2018 at which time patient was doing well from a cardiac perspective.   We received pre-op risk assessment form for upcoming EGD with request for recommendations on holding Plavix. Per Dr. Darnelle Bos last note, he is on DAPT following recent peripheral vascular intervention and thus duration of DAPT defered to vascular surgery. Therefore, will need vascular surgery's input on how Jeane to hold Plavix and Aspirin prior to EGD. I will route this note to Dr. Lucky Cowboy for his input.   Dr. Lucky Cowboy, please route response back to P CV DIV PREOP .  Thank you! Callie

## 2019-01-29 ENCOUNTER — Telehealth: Payer: Self-pay

## 2019-01-29 NOTE — Telephone Encounter (Signed)
Contacted pt and advised him per Dr. Saunders Revel, ok to stop Plavix 75mg  only 3 days prior and to restart immediately after procedure if allowed.

## 2019-02-03 ENCOUNTER — Other Ambulatory Visit: Payer: Self-pay | Admitting: Cardiology

## 2019-02-04 ENCOUNTER — Other Ambulatory Visit: Payer: Self-pay | Admitting: *Deleted

## 2019-02-04 DIAGNOSIS — I10 Essential (primary) hypertension: Secondary | ICD-10-CM

## 2019-02-04 MED ORDER — METOPROLOL SUCCINATE ER 25 MG PO TB24: 25.0000 mg | ORAL_TABLET | Freq: Every day | ORAL | 0 refills | Status: DC

## 2019-02-04 MED ORDER — AMLODIPINE BESYLATE 5 MG PO TABS: 5.0000 mg | ORAL_TABLET | Freq: Every day | ORAL | 0 refills | Status: DC

## 2019-02-05 NOTE — Anesthesia Preprocedure Evaluation (Addendum)
Anesthesia Evaluation  Patient identified by MRN, date of birth, ID band Patient awake    Reviewed: Allergy & Precautions, NPO status , Patient's Chart, lab work & pertinent test results  History of Anesthesia Complications Negative for: history of anesthetic complications  Airway Mallampati: II  TM Distance: >3 FB Neck ROM: Full    Dental   Pulmonary former smoker (quit 2007; continues to use chewing tobacco),    breath sounds clear to auscultation       Cardiovascular hypertension, (-) angina+ CAD (s/p MI and stents on Plavix), + Past MI, + Cardiac Stents, + Peripheral Vascular Disease (s/p CEA) and +CHF  (-) DOE  Rhythm:Regular Rate:Normal   HLD  ECG 03/12/18: SR with occasional PVCs; LBBB  Limited echo (02/27/2018): Mildly dilated LV with mild LVH.  LVEF 40-45% with anterior, anteroseptal, and apical hypokinesis.  Grade 1 diastolic dysfunction.  LHC (11/27/2017): LMCA normal.  LAD with 30% proximal stenosis.  LCx with widely patent mid vessel stent followed by 40% distal stensosi.  40% mid RCA stenosis.  LVEF 40-45% with LVEDP 13 mmHg.  Echo (11/25/2017): Normal LV size with mild LVH.  LVEF 45-50% with grade 1 diastolic dysfunction.  Trivial AI.  Mild MR.  Mild LAE.  Normal RV size and function.   Neuro/Psych    GI/Hepatic hiatal hernia, PUD, GERD  Controlled, Esophageal stenosis s/p dilation   Endo/Other    Renal/GU      Musculoskeletal  (+) Arthritis ,   Abdominal   Peds  Hematology   Anesthesia Other Findings   Reproductive/Obstetrics                           Anesthesia Physical Anesthesia Plan  ASA: III  Anesthesia Plan: General   Post-op Pain Management:    Induction: Intravenous  PONV Risk Score and Plan: 2 and Propofol infusion, TIVA and Treatment may vary due to age or medical condition  Airway Management Planned: Natural Airway and Nasal Cannula  Additional  Equipment:   Intra-op Plan:   Post-operative Plan:   Informed Consent: I have reviewed the patients History and Physical, chart, labs and discussed the procedure including the risks, benefits and alternatives for the proposed anesthesia with the patient or authorized representative who has indicated his/her understanding and acceptance.       Plan Discussed with: CRNA and Anesthesiologist  Anesthesia Plan Comments:         Anesthesia Quick Evaluation

## 2019-02-05 NOTE — Telephone Encounter (Signed)
This is Dr. End's pt 

## 2019-02-06 NOTE — Discharge Instructions (Signed)

## 2019-02-07 ENCOUNTER — Other Ambulatory Visit: Payer: Self-pay

## 2019-02-07 ENCOUNTER — Other Ambulatory Visit
Admission: RE | Admit: 2019-02-07 | Discharge: 2019-02-07 | Disposition: A | Discharge status: Home / Self Care | Payer: PPO | Source: Ambulatory Visit | Attending: Gastroenterology | Admitting: Gastroenterology

## 2019-02-07 DIAGNOSIS — Z01812 Encounter for preprocedural laboratory examination: Secondary | ICD-10-CM | POA: Diagnosis not present

## 2019-02-07 DIAGNOSIS — Z20828 Contact with and (suspected) exposure to other viral communicable diseases: Secondary | ICD-10-CM | POA: Insufficient documentation

## 2019-02-07 LAB — SARS CORONAVIRUS 2 (TAT 6-24 HRS): SARS Coronavirus 2: NEGATIVE

## 2019-02-11 ENCOUNTER — Encounter: Payer: Self-pay | Admitting: Gastroenterology

## 2019-02-11 ENCOUNTER — Ambulatory Visit
Admission: RE | Admit: 2019-02-11 | Discharge: 2019-02-11 | Disposition: A | Discharge status: Home / Self Care | Payer: PPO | Attending: Gastroenterology | Admitting: Gastroenterology

## 2019-02-11 ENCOUNTER — Encounter
Admission: RE | Disposition: A | Discharge status: Home / Self Care | Payer: Self-pay | Source: Home / Self Care | Attending: Gastroenterology

## 2019-02-11 ENCOUNTER — Ambulatory Visit: Payer: PPO | Admitting: Anesthesiology

## 2019-02-11 ENCOUNTER — Other Ambulatory Visit: Payer: Self-pay

## 2019-02-11 DIAGNOSIS — I2510 Atherosclerotic heart disease of native coronary artery without angina pectoris: Secondary | ICD-10-CM | POA: Insufficient documentation

## 2019-02-11 DIAGNOSIS — Z87891 Personal history of nicotine dependence: Secondary | ICD-10-CM | POA: Diagnosis not present

## 2019-02-11 DIAGNOSIS — Z7982 Long term (current) use of aspirin: Secondary | ICD-10-CM | POA: Insufficient documentation

## 2019-02-11 DIAGNOSIS — K222 Esophageal obstruction: Secondary | ICD-10-CM | POA: Diagnosis not present

## 2019-02-11 DIAGNOSIS — I509 Heart failure, unspecified: Secondary | ICD-10-CM | POA: Insufficient documentation

## 2019-02-11 DIAGNOSIS — Z7901 Long term (current) use of anticoagulants: Secondary | ICD-10-CM | POA: Insufficient documentation

## 2019-02-11 DIAGNOSIS — R1310 Dysphagia, unspecified: Secondary | ICD-10-CM | POA: Insufficient documentation

## 2019-02-11 DIAGNOSIS — Z955 Presence of coronary angioplasty implant and graft: Secondary | ICD-10-CM | POA: Diagnosis not present

## 2019-02-11 DIAGNOSIS — K219 Gastro-esophageal reflux disease without esophagitis: Secondary | ICD-10-CM | POA: Diagnosis not present

## 2019-02-11 DIAGNOSIS — I110 Hypertensive heart disease with heart failure: Secondary | ICD-10-CM | POA: Insufficient documentation

## 2019-02-11 DIAGNOSIS — K317 Polyp of stomach and duodenum: Secondary | ICD-10-CM | POA: Diagnosis not present

## 2019-02-11 DIAGNOSIS — Z79899 Other long term (current) drug therapy: Secondary | ICD-10-CM | POA: Diagnosis not present

## 2019-02-11 DIAGNOSIS — M1990 Unspecified osteoarthritis, unspecified site: Secondary | ICD-10-CM | POA: Diagnosis not present

## 2019-02-11 DIAGNOSIS — R1319 Other dysphagia: Secondary | ICD-10-CM

## 2019-02-11 HISTORY — PX: ESOPHAGOGASTRODUODENOSCOPY (EGD) WITH PROPOFOL: SHX5813

## 2019-02-11 HISTORY — PX: POLYPECTOMY: SHX5525

## 2019-02-11 SURGERY — ESOPHAGOGASTRODUODENOSCOPY (EGD) WITH PROPOFOL
Anesthesia: General | Site: Mouth

## 2019-02-11 MED ORDER — SODIUM CHLORIDE 0.9 % IV SOLN: INTRAVENOUS | Status: DC

## 2019-02-11 MED ORDER — ONDANSETRON HCL 4 MG/2ML IJ SOLN: 4.0000 mg | Freq: Once | INTRAMUSCULAR | Status: DC | PRN

## 2019-02-11 MED ORDER — PROPOFOL 10 MG/ML IV BOLUS
INTRAVENOUS | Status: DC | PRN
  Administered 2019-02-11: 30 mg via INTRAVENOUS
  Administered 2019-02-11: 20 mg via INTRAVENOUS
  Administered 2019-02-11: 30 mg via INTRAVENOUS
  Administered 2019-02-11: 50 mg via INTRAVENOUS
  Administered 2019-02-11: 120 mg via INTRAVENOUS

## 2019-02-11 MED ORDER — LACTATED RINGERS IV SOLN
100.0000 mL/h | INTRAVENOUS | Status: DC
  Administered 2019-02-11: 100 mL/h via INTRAVENOUS

## 2019-02-11 MED ORDER — LIDOCAINE HCL (CARDIAC) PF 100 MG/5ML IV SOSY
PREFILLED_SYRINGE | INTRAVENOUS | Status: DC | PRN
  Administered 2019-02-11: 30 mg via INTRAVENOUS

## 2019-02-11 MED ORDER — ACETAMINOPHEN 10 MG/ML IV SOLN: 1000.0000 mg | Freq: Once | INTRAVENOUS | Status: DC | PRN

## 2019-02-11 MED ORDER — GLYCOPYRROLATE 0.2 MG/ML IJ SOLN
INTRAMUSCULAR | Status: DC | PRN
  Administered 2019-02-11: .1 mg via INTRAVENOUS

## 2019-02-11 SURGICAL SUPPLY — 10 items
BALLN DILATOR 15-18 8 (BALLOONS) ×2
BALLOON DILATOR 15-18 8 (BALLOONS) IMPLANT
BLOCK BITE 60FR ADLT L/F GRN (MISCELLANEOUS) ×2 IMPLANT
CANISTER SUCT 1200ML W/VALVE (MISCELLANEOUS) ×2 IMPLANT
FORCEPS BIOP RAD 4 LRG CAP 4 (CUTTING FORCEPS) ×1 IMPLANT
GOWN CVR UNV OPN BCK APRN NK (MISCELLANEOUS) ×2 IMPLANT
GOWN ISOL THUMB LOOP REG UNIV (MISCELLANEOUS) ×4
KIT ENDO PROCEDURE OLY (KITS) ×2 IMPLANT
SYR INFLATION 60ML (SYRINGE) ×1 IMPLANT
WATER STERILE IRR 250ML POUR (IV SOLUTION) ×2 IMPLANT

## 2019-02-11 NOTE — Interval H&P Note (Signed)
History and Physical Interval Note:  02/11/2019 9:56 AM  [name]  has presented today for surgery, with the diagnosis of DYSPHAGIA R13.10.  The various methods of treatment have been discussed with the patient and family. After consideration of risks, benefits and other options for treatment, the patient has consented to  Procedure(s): ESOPHAGOGASTRODUODENOSCOPY (EGD) WITH PROPOFOL (N/A) as a surgical intervention.  The patient's history has been reviewed, patient examined, no change in status, stable for surgery.  I have reviewed the patient's chart and labs.  Questions were answered to the patient's satisfaction.     Ernesteen Mihalic [employer]

## 2019-02-11 NOTE — Progress Notes (Signed)
Per Dr. Allen Norris, pt may restart plavix in 3 days, this was relayed to pt's wife.

## 2019-02-11 NOTE — Op Note (Addendum)
[facility] Gastroenterology Patient Name: [name] Procedure Date: 02/11/2019 10:56 AM MRN: [id number] Account #: [id number] Date of Birth: September 11, 1950 Admit Type: Outpatient Age: 68 Room: [facility] OR ROOM 01 Gender: Male Note Status: Finalized Procedure:             Upper GI endoscopy Indications:           Dysphagia Providers:             [name] MD, MD Referring MD:          Halina Maidens, MD (Referring MD) Medicines:             Propofol per Anesthesia Complications:         No immediate complications. Procedure:             Pre-Anesthesia Assessment:                        - Prior to the procedure, a History and Physical was                         performed, and patient medications and allergies were                         reviewed. The patient's tolerance of previous                         anesthesia was also reviewed. The risks and benefits                         of the procedure and the sedation options and risks                         were discussed with the patient. All questions were                         answered, and informed consent was obtained. Prior                         Anticoagulants: The patient has taken no previous                         anticoagulant or antiplatelet agents. ASA Grade                         Assessment: II - A patient with mild systemic disease.                         After reviewing the risks and benefits, the patient                         was deemed in satisfactory condition to undergo the                         procedure.                        After obtaining informed consent, the endoscope was  passed under direct vision. Throughout the procedure,                         the patient's blood pressure, pulse, and oxygen                         saturations were monitored continuously. The was                         introduced through the mouth, and advanced to the          second part of duodenum. The upper GI endoscopy was                         accomplished without difficulty. The patient tolerated                         the procedure well. Findings:      One benign-appearing, intrinsic mild stenosis was found at the       gastroesophageal junction. The stenosis was traversed. A TTS dilator was       passed through the scope. Dilation with a 15-16.5-18 mm balloon dilator       was performed to 18 mm. The dilation site was examined following       endoscope reinsertion and showed complete resolution of luminal       narrowing.      The stomach was normal.      A single 6 mm sessile polyp with no bleeding was found in the second       portion of the duodenum. Biopsies were taken with a cold forceps for       histology.      Biopsies were obtained with cold forceps for histology in the middle       third of the esophagus. Impression:            - Benign-appearing esophageal stenosis. Dilated.                        - Normal stomach.                        - A single duodenal polyp. Biopsied.                        - Biopsy performed in the middle third of the                         esophagus. Recommendation:        - Discharge patient to home.                        - Resume previous diet.                        - Continue present medications.                        - Await pathology results. Procedure Code(s):     --- Professional ---                        [phone], Esophagogastroduodenoscopy, flexible,  transoral; with transendoscopic balloon dilation of                         esophagus (less than 30 mm diameter)                        43239, 59, Esophagogastroduodenoscopy, flexible,                         transoral; with biopsy, single or multiple Diagnosis Code(s):     --- Professional ---                        R13.10, Dysphagia, unspecified                        K31.7, Polyp of stomach and duodenum                         K22.2, Esophageal obstruction CPT copyright 2019 American Medical Association. All rights reserved. The codes documented in this report are preliminary and upon coder review may  be revised to meet current compliance requirements. [name] MD, MD 02/11/2019 11:15:14 AM This report has been signed electronically. Number of Addenda: 0 Note Initiated On: 02/11/2019 10:56 AM Total Procedure Duration: 0 hours 5 minutes 54 seconds  Estimated Blood Loss:  Estimated blood loss: none.      [facility]

## 2019-02-11 NOTE — Anesthesia Postprocedure Evaluation (Signed)
Anesthesia Post Note  Patient: Story Brakeman Siess  Procedure(s) Performed: ESOPHAGOGASTRODUODENOSCOPY (EGD) WITH BIOPSY and  Dilation (N/A Mouth) POLYPECTOMY (N/A Mouth)     Patient location during evaluation: PACU Anesthesia Type: General Level of consciousness: awake and alert Pain management: pain level controlled Vital Signs Assessment: post-procedure vital signs reviewed and stable Respiratory status: spontaneous breathing, nonlabored ventilation, respiratory function stable and patient connected to nasal cannula oxygen Cardiovascular status: blood pressure returned to baseline and stable Postop Assessment: no apparent nausea or vomiting Anesthetic complications: no    Landrum Carbonell A  Aiyonna Lucado

## 2019-02-11 NOTE — Anesthesia Procedure Notes (Signed)
Date/Time: 02/11/2019 11:02 AM Performed by: Cameron Ali, CRNA Pre-anesthesia Checklist: Patient identified, Emergency Drugs available, Suction available, Timeout performed and Patient being monitored Patient Re-evaluated:Patient Re-evaluated prior to induction Oxygen Delivery Method: Nasal cannula Placement Confirmation: positive ETCO2

## 2019-02-11 NOTE — Transfer of Care (Signed)
Immediate Anesthesia Transfer of Care Note  Patient: [name]  Procedure(s) Performed: ESOPHAGOGASTRODUODENOSCOPY (EGD) WITH BIOPSY and  Dilation (N/A Mouth) POLYPECTOMY (N/A Mouth)  Patient Location: PACU  Anesthesia Type: General  Level of Consciousness: awake, alert  and patient cooperative  Airway and Oxygen Therapy: Patient Spontanous Breathing and Patient connected to supplemental oxygen  Post-op Assessment: Post-op Vital signs reviewed, Patient's Cardiovascular Status Stable, Respiratory Function Stable, Patent Airway and No signs of Nausea or vomiting  Post-op Vital Signs: Reviewed and stable  Complications: No apparent anesthesia complications

## 2019-02-12 ENCOUNTER — Encounter: Payer: Self-pay | Admitting: Gastroenterology

## 2019-02-12 ENCOUNTER — Encounter: Payer: Self-pay | Admitting: *Deleted

## 2019-02-13 ENCOUNTER — Ambulatory Visit: Payer: PPO | Admitting: Internal Medicine

## 2019-02-26 ENCOUNTER — Other Ambulatory Visit: Payer: Self-pay | Admitting: Gastroenterology

## 2019-03-03 NOTE — Progress Notes (Signed)
Office Visit    Patient Name: [name] Date of Encounter: 03/06/2019  Primary Care Provider:  Glean Hess, MD Primary Cardiologist:  Nelva Bush, MD  Chief Complaint    69 yo male with history of CAD with NSTEMI 07/2015 s/p PCI to LCx, known LBBB, NICM with EF 40-45% on 01/2018 echo, PAD s/p mechanical thrombectomy and catheter-directed thrombolysis to left SFA 05/2018, carotid artery stenosis, HTN, HLD, hiatal hernia, and PUD, and here for 6 month follow-up.  Past Medical History    Past Medical History:  Diagnosis Date  . Abnormal nuclear cardiac imaging test 08/08/2015  . Arthritis    fingers  . Carotid artery occlusion   . Coronary atherosclerosis of native coronary artery 01/29/2013   stent  . Duodenal erosion   . Encounter for screening for lung cancer 07/13/2016  . Esophageal stenosis    esophageal dilation  . GERD (gastroesophageal reflux disease)   . H. pylori infection   . Heart attack [facility]) Oct. 2009   Mild  . Hiatal hernia   . Hyperlipidemia   . Hypertension   . 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 Cardiac catheterization-nonobstructive 60% RCA disease-subsequent nuclear stress test-9 minutes, low risk, mild inferior wall hypokinesis   . Pain in limb 12/19/2017  . Peripheral vascular disease ([place])   . Unstable angina ([place]) 11/25/2017   Past Surgical History:  Procedure Laterality Date  . APPENDECTOMY    . BACK SURGERY    . CARDIAC CATHETERIZATION N/A 08/07/2015   Procedure: Left Heart Cath and Coronary Angiography;  Surgeon: [name], MD;  Location: [place] [facility];  Service: Cardiovascular;  Laterality: N/A;  . CARDIAC CATHETERIZATION N/A 08/07/2015   Procedure: Coronary Stent Intervention;  Surgeon: [name], MD;  Location: [place] [facility];  Service: Cardiovascular;  Laterality: N/A;  . CARDIAC CATHETERIZATION N/A 08/07/2015   Procedure: Coronary Stent Intervention;  Surgeon:  [name], MD;  Location: [place] [facility];  Service: Cardiovascular;  Laterality: N/A;  . CAROTID ENDARTERECTOMY  01/05/2006   Right  CEA with DPA  . CATARACT EXTRACTION W/ INTRAOCULAR LENS IMPLANT Left 12/04/2017  . CATARACT EXTRACTION W/PHACO Left 12/04/2017   Procedure: CATARACT EXTRACTION PHACO AND INTRAOCULAR LENS PLACEMENT ([place]) LEFT;  Surgeon: Eulogio Bear, MD;  Location: [place];  Service: Ophthalmology;  Laterality: Left;  . CATARACT EXTRACTION W/PHACO Right 02/06/2018   Procedure: CATARACT EXTRACTION PHACO AND INTRAOCULAR LENS PLACEMENT (IOC)RIGHT;  Surgeon: Eulogio Bear, MD;  Location: [place];  Service: Ophthalmology;  Laterality: Right;  . COLONOSCOPY  05/20/2008  . COLONOSCOPY WITH PROPOFOL N/A 09/13/2018   Procedure: COLONOSCOPY WITH BIOPSY;  Surgeon: [name], MD;  Location: [place];  Service: Endoscopy;  Laterality: N/A;  . CORONARY STENT PLACEMENT  08/07/2015   MID CIRCUMFLEX  . ESOPHAGOGASTRODUODENOSCOPY (EGD) WITH PROPOFOL N/A 02/11/2019   Procedure: ESOPHAGOGASTRODUODENOSCOPY (EGD) WITH BIOPSY and  Dilation;  Surgeon: [name], MD;  Location: [place];  Service: Endoscopy;  Laterality: N/A;  . HIP SURGERY Left 10/2016   left hip tendon repair  . LEFT HEART CATH AND CORONARY ANGIOGRAPHY N/A 11/27/2017   Procedure: LEFT HEART CATH AND CORONARY ANGIOGRAPHY;  Surgeon: Wellington Hampshire, MD;  Location: [place] [facility];  Service: Cardiovascular;  Laterality: N/A;  . LOWER EXTREMITY ANGIOGRAPHY Left 02/12/2018   Procedure: LOWER EXTREMITY ANGIOGRAPHY;  Surgeon: Algernon [place], MD;  Location: [facility]  INVASIVE CV LAB;  Service: Cardiovascular;  Laterality: Left;  . LOWER EXTREMITY ANGIOGRAPHY Left 03/07/2018   Procedure: LOWER EXTREMITY ANGIOGRAPHY;  Surgeon: Algernon [place], MD;  Location: [place] [facility];  Service: Cardiovascular;  Laterality: Left;  . LOWER EXTREMITY ANGIOGRAPHY Left 06/04/2018   Procedure:  LOWER EXTREMITY ANGIOGRAPHY;  Surgeon: Algernon [place], MD;  Location: [place] [facility];  Service: Cardiovascular;  Laterality: Left;  . POLYPECTOMY N/A 09/13/2018   Procedure: POLYPECTOMY;  Surgeon: [name], MD;  Location: [place];  Service: Endoscopy;  Laterality: N/A;  . POLYPECTOMY N/A 02/11/2019   Procedure: POLYPECTOMY;  Surgeon: [name], MD;  Location: [place];  Service: Endoscopy;  Laterality: N/A;  . SPINE SURGERY    . TONSILLECTOMY      Allergies  Allergies  Allergen Reactions  . Brilinta [Ticagrelor]     Shortness of breath  . Zetia [Ezetimibe] Other (See Comments)    Muscle aches  . Statins Other (See Comments)    Failed Crestor 5 mg twice weekly, Crestor 20 mg daily, Pravastatin 40 mg qd, Lipitor, Zocor - muscle aches    History of Present Illness    69 yo male with PMH as above and history significant for CAD, NICM, and PAD. He underwent LHC 10/2017 for chest pain that revealed a widely patent LCx stent without significant restenosis and otherwise mild to moderate CAD. He had mild to moderately reduced LVSF with EF 40-45% and mildly elevated LVEDP at 17mmg. Given his CP did not seem to be cardiac, recommendation was for continued medication management. Echo at that time showed EF 45-50%. Repeat limited echo 01/2018 showed slightly worsened pump function at 40-45%. He underwent mechanical thrombectomy and cathter thrombolysis to the L SFA in early 05/2018 and reported minimal leg pain at that time. When last seen by virtual visit by his primary cardiologist, he was reportedly doing well from a cardiac standpoint. He remained enrolled in the CLEAR study for management of his HLD. He was also enrolled in a PCSK9i trial, before the CLEAR trial, with marked reduction in his LDL without side effects. It was noted he may need to consider switching to a PCSK9i soon. It was also noted his BP was elevated from that of baseline but with consideration of his  excitement regarding purchasing a new home. Medication changes were therefore deferred at that time.   Since that time, the patient reports that he has been doing fairly well.  He denies any chest pain, racing heart rate, or palpitations.  He does note "not breathing as freely;" however, he does not feel certain that it would necessarily qualify as shortness of breath.  He reports that it is only transient and on rare occasions. It only occurs at rest.  He denies dyspnea on exertion and is still able to climb stairs and walk without any shortness of breath.  In addition, he reports he has struggled to keep his weight off and, especially given he does not plan to go to the gym during the pandemic. He continues to remain active at home as tolerated by his chronic pain (L hip, R foot).  Clinic weight today 185 pounds (83.9 kg) and 179 pounds (81.2 kg) ~1 year ago, which he states has been gradual.  He continues to be enrolled in the CLEAR study for management of his hyperlipidemia with most recent labs as below and LDL below goal 70.  He denies any significant orthopnea, PND, LEE, or early satiety.  He reports medication compliance.  He has not had any signs or symptoms consistent with bleeding.  BP today is adequately controlled and improved from previous telemedicine visit, at which time he reported his suspicion that his BP was above his usual readings 2/2 excitement about purchasing a new home.  Home Medications    Prior to Admission medications   Medication Sig Start Date End Date Taking? Authorizing Provider  Alirocumab (PRALUENT) 150 MG/ML SOAJ Inject 1 pen into the skin every 14 (fourteen) days. 08/13/18   End, Harrell Gave, MD  amLODipine (NORVASC) 5 MG tablet Take 1 tablet (5 mg total) by mouth daily. 02/04/19   End, Harrell Gave, MD  aspirin EC 81 MG tablet Take 1 tablet (81 mg total) by mouth daily. 06/05/18   Stegmayer, Janalyn Harder, PA-C  clopidogrel (PLAVIX) 75 MG tablet Take 1 tablet (75 mg total) by  mouth daily. 06/05/18   Stegmayer, Joelene Millin A, PA-C  cyclobenzaprine (FLEXERIL) 10 MG tablet Take 1 tablet (10 mg total) by mouth 3 (three) times daily as needed for muscle spasms. 06/14/18   Glean Hess, MD  HYDROcodone-acetaminophen (NORCO/VICODIN) 5-325 MG tablet Take 1 tablet by mouth every 6 (six) hours as needed for severe pain. Patient taking differently: Take 1 tablet by mouth every 6 (six) hours as needed for severe pain. AS NEEDED 06/05/18   Stegmayer, Joelene Millin A, PA-C  lisinopril (ZESTRIL) 20 MG tablet TAKE 1 TABLET BY MOUTH EVERY DAY 02/05/19   End, Harrell Gave, MD  meloxicam (MOBIC) 7.5 MG tablet Take 1 tablet (7.5 mg total) by mouth daily. Patient taking differently: Take 7.5 mg by mouth as needed.  05/17/18   Glean Hess, MD  metoprolol succinate (TOPROL-XL) 25 MG 24 hr tablet Take 1 tablet (25 mg total) by mouth daily. 02/04/19   End, Harrell Gave, MD  nitroGLYCERIN (NITROSTAT) 0.4 MG SL tablet Place 1 tablet (0.4 mg total) under the tongue every 5 (five) minutes as needed for chest pain. 08/08/15   Erlene Quan, PA-C  pantoprazole (PROTONIX) 40 MG tablet TAKE 1 TABLET BY MOUTH EVERY DAY 02/26/19   Ladene Artist, MD  traMADol (ULTRAM) 50 MG tablet Take 1 tablet (50 mg total) by mouth daily as needed for moderate pain. 11/04/18 05/03/19  Glean Hess, MD    Review of Systems    He denies chest pain, palpitations, dyspnea, pnd, orthopnea, n, v, dizziness, syncope, significant or pitting edema, or early satiety.  He reports "feeling as he cannot breathe as freely" yet would not classify it as necessarily SOB and states this only occurs on rare occasions and at rest without DOE.  He reports weight gain; however, he believes this is related to his decreased exercise and does not feel as if volume overloaded.  No signs or symptoms consistent with bleeding.  All other systems reviewed and are otherwise negative except as noted above.  Physical Exam    VS:  BP 120/60 (BP Location:  Left Arm, Patient Position: Sitting, Cuff Size: Normal)   Pulse 62   Ht 5\' 6"  (1.676 m)   Wt 185 lb (83.9 kg)   SpO2 97%   BMI 29.86 kg/m  , BMI Body mass index is 29.86 kg/m. GEN: Well nourished, well developed, in no acute distress.  Mask in place. HEENT: normal. Neck: Supple, no JVD, carotid bruits, or masses. Cardiac: RRR, no murmurs, rubs, or gallops. No clubbing, cyanosis.  No significant bilateral lower extremity edema.  Radials/DP/PT 1+ and equal bilaterally.  Respiratory:  Respirations  regular and unlabored, clear to auscultation bilaterally. GI: Soft, nontender, nondistended, BS + x 4. MS: no deformity or atrophy. Skin: warm and dry, no rash. Neuro:  Strength and sensation are intact. Psych: Normal affect.  Accessory Clinical Findings    ECG personally reviewed by me today -NSR, 62 bpm, known left bundle branch block- no acute changes.  Filed Weights   03/06/19 0746  Weight: 185 lb (83.9 kg)    Echo 02/27/2018 - Limited study for evaluation of left ventricular function. - Left ventricle: The cavity size was mildly dilated. Wall   thickness was increased in a pattern of mild LVH. Systolic   function was mildly to moderately reduced. The estimated ejection   fraction was in the range of 40% to 45%. There is hypokinesis of   the anteroseptal, anterior, and apical myocardium. Doppler   parameters are consistent with abnormal left ventricular   relaxation (grade 1 diastolic dysfunction).  LHC 11/27/2017  Prox RCA to Mid RCA lesion is 40% stenosed.  Prox LAD lesion is 30% stenosed.  Previously placed Mid Cx stent (unknown type) is widely patent.  Ost 2nd Mrg lesion is 30% stenosed.  Dist Cx lesion is 40% stenosed.  There is mild to moderate left ventricular systolic dysfunction.  LV end diastolic pressure is mildly elevated. 1.  Widely patent left circumflex stent with no significant restenosis.  Otherwise mild to moderate nonobstructive coronary artery  disease. 2.  Mild to moderately reduced LV systolic function with an EF of 40 to 45%. 3.  Mildly elevated left ventricular end-diastolic pressure at 13 mmHg. Recommendations: The patient's chest pain does not seem to be cardiac. He does have cardiomyopathy which seems to be nonischemic.  Continue treatment with a beta-blocker and ACE inhibitor.  Uptitrate as tolerated.  Recent labs: Cholesterol 144, LDL (04/2018) 127  LDL Calc 68 (11/2018), TG 88, HDL 59 Na 137, K 4.6, glucose 111, Cr 0.97, BUN 28  Hgb 12.1, RBC 3.89, WBC 5.4 TSH 1.420 08/2017 5.6  Assessment & Plan    Coronary artery disease without angina --No symptoms to suggest worsening coronary insufficiency.  He has not had to use his sublingual nitro.  September 2020 catheterization showed patent left circumflex stent to moderate, nonobstructive CAD.  As above, he continues to be enrolled in a research trial for management of his hyperlipidemia and receiving Praluent injections every 2 weeks.  Given the pandemic, he is not exercising at the gym but rather staying safe with home workouts as tolerated by his L hip (s/p surgery) and R plantar fascitis. Most recent check of cholesterol shows LDL controlled and at goal.  Continue current medical therapy and secondary prevention with current medications.   Chronic systolic heart failure secondary to nonischemic cardiomyopathy --He appears euvolemic on exam. He notes increasing weight over the last year and attributes this to not being able to work out as in the past as outlined above. He notes transient and rare SOB that does not limit him, occurs only at rest, and that he feels is better classified as "not breathing freely."  No DOE.  We discussed updating his previous echo, which showed LVEF around 40 to 45%. For now, he plans to monitor his weight, volume status, and breathing at home. With persistent or worsening sx, or if new CP/DOE, he will call the office. Plan to reassess at next  follow-up and have a low threshold to repeat echocardiogram if needed before that time. Continue lisinopril and metoprolol.  Peripheral arterial disease --  S/p bilateral lower extremity interventions by Dr. Lucky Cowboy.  No significant / worsening pain in lower extremities or edema on exam.  Defer management of DAPT to vascular surgery.  No S/SX of bleeding on DAPT.  Hypertension --Blood pressure well controlled today.  Continue current medical management with metoprolol and lisinopril.  HLD with statin intolerance 2/2 myalgia --As above, patient has a history of intolerance to statins/myalgias on statins.  He is enrolled in the CLEAR research study with Praluent injections every 2 weeks and significant improvement in his lipids.  Most recent LDL at goal of less than 70 and most recent LDLc 68.  Continue ongoing lipid management.  Disposition: Follow-up in 6 months or sooner if needed. Call the office if transient SOB persists, continued weight gain, or any new or concerning symptoms.    Arvil Chaco, PA-C 03/06/2019, 8:51 AM

## 2019-03-06 ENCOUNTER — Ambulatory Visit (INDEPENDENT_AMBULATORY_CARE_PROVIDER_SITE_OTHER): Payer: PPO | Admitting: Physician Assistant

## 2019-03-06 ENCOUNTER — Other Ambulatory Visit: Payer: Self-pay

## 2019-03-06 ENCOUNTER — Encounter: Payer: Self-pay | Admitting: Physician Assistant

## 2019-03-06 VITALS — BP 120/60 | HR 62 | Ht 66.0 in | Wt 185.0 lb

## 2019-03-06 DIAGNOSIS — I2510 Atherosclerotic heart disease of native coronary artery without angina pectoris: Secondary | ICD-10-CM

## 2019-03-06 DIAGNOSIS — I739 Peripheral vascular disease, unspecified: Secondary | ICD-10-CM

## 2019-03-06 DIAGNOSIS — Z789 Other specified health status: Secondary | ICD-10-CM

## 2019-03-06 DIAGNOSIS — I5022 Chronic systolic (congestive) heart failure: Secondary | ICD-10-CM | POA: Diagnosis not present

## 2019-03-06 DIAGNOSIS — I428 Other cardiomyopathies: Secondary | ICD-10-CM | POA: Diagnosis not present

## 2019-03-06 DIAGNOSIS — M791 Myalgia, unspecified site: Secondary | ICD-10-CM

## 2019-03-06 DIAGNOSIS — E785 Hyperlipidemia, unspecified: Secondary | ICD-10-CM

## 2019-03-06 DIAGNOSIS — I10 Essential (primary) hypertension: Secondary | ICD-10-CM

## 2019-03-06 NOTE — Patient Instructions (Signed)
Medication Instructions:   1. Your physician recommends that you continue on your current medications as directed. Please refer to the Current Medication list given to you today.  *If you need a refill on your cardiac medications before your next appointment, please call your pharmacy*  Lab Work:  1. None.   If you have labs (blood work) drawn today and your tests are completely normal, you will receive your results only by: Marland Kitchen MyChart Message (if you have MyChart) OR . A paper copy in the mail If you have any lab test that is abnormal or we need to change your treatment, we will call you to review the results.  Testing/Procedures:  1. None Ordered  Follow-Up: At [facility], you and your health needs are our priority.  As part of our continuing mission to provide you with exceptional heart care, we have created designated Provider Care Teams.  These Care Teams include your primary Cardiologist (physician) and Advanced Practice Providers (APPs -  Physician Assistants and Nurse Practitioners) who all work together to provide you with the care you need, when you need it.  Your next appointment:   6 month(s)  The format for your next appointment:   In Person  Provider:    You may see Nelva Bush, MD or one of the following Advanced Practice Providers on your designated Care Team:    Murray Hodgkins, NP  Christell Faith, PA-C  Marrianne Mood, PA-C

## 2019-04-30 ENCOUNTER — Other Ambulatory Visit: Payer: Self-pay | Admitting: Internal Medicine

## 2019-05-11 ENCOUNTER — Other Ambulatory Visit (INDEPENDENT_AMBULATORY_CARE_PROVIDER_SITE_OTHER): Payer: Self-pay | Admitting: Vascular Surgery

## 2019-05-11 ENCOUNTER — Other Ambulatory Visit: Payer: Self-pay | Admitting: Internal Medicine

## 2019-05-11 DIAGNOSIS — I10 Essential (primary) hypertension: Secondary | ICD-10-CM

## 2019-05-13 ENCOUNTER — Other Ambulatory Visit: Payer: Self-pay | Admitting: Internal Medicine

## 2019-05-21 ENCOUNTER — Other Ambulatory Visit: Payer: Self-pay

## 2019-05-21 ENCOUNTER — Encounter: Payer: Self-pay | Admitting: Internal Medicine

## 2019-05-21 ENCOUNTER — Ambulatory Visit (INDEPENDENT_AMBULATORY_CARE_PROVIDER_SITE_OTHER): Payer: PPO | Admitting: Internal Medicine

## 2019-05-21 VITALS — BP 122/72 | HR 62 | Temp 97.9°F | Ht 66.0 in | Wt 176.0 lb

## 2019-05-21 DIAGNOSIS — Z1159 Encounter for screening for other viral diseases: Secondary | ICD-10-CM | POA: Diagnosis not present

## 2019-05-21 DIAGNOSIS — Z0000 Encounter for general adult medical examination without abnormal findings: Secondary | ICD-10-CM | POA: Diagnosis not present

## 2019-05-21 DIAGNOSIS — M5134 Other intervertebral disc degeneration, thoracic region: Secondary | ICD-10-CM | POA: Diagnosis not present

## 2019-05-21 DIAGNOSIS — E785 Hyperlipidemia, unspecified: Secondary | ICD-10-CM | POA: Diagnosis not present

## 2019-05-21 DIAGNOSIS — I25118 Atherosclerotic heart disease of native coronary artery with other forms of angina pectoris: Secondary | ICD-10-CM

## 2019-05-21 DIAGNOSIS — I10 Essential (primary) hypertension: Secondary | ICD-10-CM | POA: Diagnosis not present

## 2019-05-21 DIAGNOSIS — Z125 Encounter for screening for malignant neoplasm of prostate: Secondary | ICD-10-CM

## 2019-05-21 LAB — POCT URINALYSIS DIPSTICK
Bilirubin, UA: NEGATIVE
Blood, UA: NEGATIVE
Glucose, UA: NEGATIVE
Ketones, UA: NEGATIVE
Leukocytes, UA: NEGATIVE
Nitrite, UA: NEGATIVE
Protein, UA: NEGATIVE
Spec Grav, UA: 1.015 (ref 1.010–1.025)
Urobilinogen, UA: 0.2 E.U./dL
pH, UA: 6 (ref 5.0–8.0)

## 2019-05-21 MED ORDER — TRAMADOL HCL 50 MG PO TABS: 50.0000 mg | ORAL_TABLET | Freq: Every day | ORAL | 0 refills | Status: AC | PRN

## 2019-05-21 NOTE — Progress Notes (Signed)
Date:  05/21/2019   Name:  [name]   DOB:  October 01, 1950   MRN:  [id number]   Chief Complaint: Annual Exam Rolando Abler [name] is a 69 y.o. male who presents today for his Complete Annual Exam. He feels fairly well. He reports exercising golfing. He can't walk much due to longstanding low back and left hip issues.  He reports he is sleeping well.   Colonoscopy  08/2018 Immunization History  Administered Date(s) Administered  . Fluad Quad(high Dose 65+) 11/23/2018  . Influenza, High Dose Seasonal PF 12/23/2016, 12/12/2017  . Influenza-Unspecified 12/15/2015, 12/23/2016  . PFIZER SARS-COV-2 Vaccination 04/09/2019, 04/30/2019  . Pneumococcal Conjugate-13 06/15/2015  . Pneumococcal Polysaccharide-23 01/06/2017  . Tdap 04/18/2012    Hypertension This is a chronic problem. The problem is controlled. Pertinent negatives include no chest pain, headaches, palpitations or shortness of breath. Past treatments include ACE inhibitors, calcium channel blockers and beta blockers. The current treatment provides significant improvement. There are no compliance problems.  Hypertensive end-organ damage includes CAD/MI.  Back Pain This is a recurrent problem. The current episode started more than 1 year ago. The problem occurs intermittently. The pain is present in the lumbar spine. The pain does not radiate. The symptoms are aggravated by twisting and bending. Pertinent negatives include no abdominal pain, chest pain, dysuria or headaches. He has tried muscle relaxant and NSAIDs (and tramadol PRN - last rx 12 mo ago for #30) for the symptoms.    Lab Results  Component Value Date   CREATININE 0.97 06/05/2018   BUN 28 (H) 06/05/2018   NA 137 06/05/2018   K 4.6 06/05/2018   CL 99 06/05/2018   CO2 29 06/05/2018   Lab Results  Component Value Date   CHOL 144 12/10/2018   HDL 59 12/10/2018   LDLCALC 68 12/10/2018   TRIG 88 12/10/2018   CHOLHDL 2.4 12/10/2018   Lab Results  Component Value Date   TSH 1.420 09/06/2017   Lab Results  Component Value Date   HGBA1C 5.6 09/06/2017   Lab Results  Component Value Date   WBC 5.4 06/05/2018   HGB 12.1 (L) 06/05/2018   HCT 37.2 (L) 06/05/2018   MCV 95.6 06/05/2018   PLT 182 06/05/2018   Lab Results  Component Value Date   ALT 22 05/17/2018   AST 27 05/17/2018   ALKPHOS 52 05/17/2018   BILITOT 0.7 05/17/2018     Review of Systems  Constitutional: Negative for appetite change, chills, diaphoresis, fatigue and unexpected weight change.  HENT: Negative for hearing loss, tinnitus, trouble swallowing and voice change.   Eyes: Negative for visual disturbance.  Respiratory: Negative for choking, shortness of breath and wheezing.   Cardiovascular: Negative for chest pain, palpitations and leg swelling.  Gastrointestinal: Negative for abdominal pain, blood in stool, constipation and diarrhea.  Genitourinary: Negative for difficulty urinating, dysuria and frequency.  Musculoskeletal: Positive for arthralgias and gait problem. Negative for back pain and myalgias.  Skin: Negative for color change and rash.  Neurological: Negative for dizziness, syncope and headaches.  Hematological: Negative for adenopathy.  Psychiatric/Behavioral: Negative for dysphoric mood and sleep disturbance.    Patient Active Problem List   Diagnosis Date Noted  . Stricture and stenosis of esophagus   . Encounter for screening colonoscopy   . Polyp of colon   . Rectal polyp   . Coronary artery disease involving native coronary artery of native heart without angina pectoris 08/13/2018  . PAD (peripheral artery disease) ([place])  08/13/2018  . Carotid stenosis 07/17/2018  . Atherosclerosis of artery of extremity with rest pain ([place]) 06/04/2018  . Chronic systolic heart failure ([place]) 03/12/2018  . Nonischemic cardiomyopathy ([place]) 12/08/2017  . Myalgia due to statin 12/08/2017  . 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 ([place]) 05/01/2017  . Foot  pain, bilateral 05/01/2017  . 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 Tobacco use disorder, moderate, in sustained remission 07/13/2016  . Overweight (BMI 25.0-29.9) 07/13/2016  . 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[place]) 07/13/2016  . Dysphagia 11/26/2015  . Chronic anticoagulation: plavix 10/15/2015  . Chronic left hip pain 09/15/2015  . Coronary artery disease of native heart with stable angina pectoris ([place]) 08/08/2015  . Essential hypertension 08/08/2015  . Hyperlipidemia LDL goal <70 01/29/2013  . History of CEA (carotid endarterectomy) 11/08/2011    Allergies  Allergen Reactions  . Brilinta [Ticagrelor]     Shortness of breath  . Zetia [Ezetimibe] Other (See Comments)    Muscle aches  . Statins Other (See Comments)    Failed Crestor 5 mg twice weekly, Crestor 20 mg daily, Pravastatin 40 mg qd, Lipitor, Zocor - muscle aches    Past Surgical History:  Procedure Laterality Date  . APPENDECTOMY    . BACK SURGERY    . CARDIAC CATHETERIZATION N/A 08/07/2015   Procedure: Left Heart Cath and Coronary Angiography;  Surgeon: [name], MD;  Location: [facility];  Service: Cardiovascular;  Laterality: N/A;  . CARDIAC CATHETERIZATION N/A 08/07/2015   Procedure: Coronary Stent Intervention;  Surgeon: [name], MD;  Location: [facility];  Service: Cardiovascular;  Laterality: N/A;  . CARDIAC CATHETERIZATION N/A 08/07/2015   Procedure: Coronary Stent Intervention;  Surgeon: [name], MD;  Location: [place];  Service: Cardiovascular;  Laterality: N/A;  . CAROTID ENDARTERECTOMY  01/05/2006   Right  CEA with DPA  . CATARACT EXTRACTION W/ INTRAOCULAR LENS IMPLANT Left 12/04/2017  . CATARACT EXTRACTION W/PHACO Left 12/04/2017   Procedure: CATARACT EXTRACTION PHACO AND INTRAOCULAR LENS PLACEMENT ([place]) LEFT;  Surgeon: Eulogio Bear, MD;  Location: [facility];  Service: Ophthalmology;  Laterality: Left;  . CATARACT EXTRACTION W/PHACO Right  02/06/2018   Procedure: CATARACT EXTRACTION PHACO AND INTRAOCULAR LENS PLACEMENT (IOC)RIGHT;  Surgeon: Eulogio Bear, MD;  Location: [place];  Service: Ophthalmology;  Laterality: Right;  . COLONOSCOPY  05/20/2008  . COLONOSCOPY WITH PROPOFOL N/A 09/13/2018   Procedure: COLONOSCOPY WITH BIOPSY;  Surgeon: [name], MD;  Location: [place] [facility];  Service: Endoscopy;  Laterality: N/A;  . CORONARY STENT PLACEMENT  08/07/2015   MID CIRCUMFLEX  . ESOPHAGOGASTRODUODENOSCOPY (EGD) WITH PROPOFOL N/A 02/11/2019   Procedure: ESOPHAGOGASTRODUODENOSCOPY (EGD) WITH BIOPSY and  Dilation;  Surgeon: [name], MD;  Location: [facility];  Service: Endoscopy;  Laterality: N/A;  . HIP SURGERY Left 10/2016   left hip tendon repair  . LEFT HEART CATH AND CORONARY ANGIOGRAPHY N/A 11/27/2017   Procedure: LEFT HEART CATH AND CORONARY ANGIOGRAPHY;  Surgeon: Wellington Hampshire, MD;  Location: [facility];  Service: Cardiovascular;  Laterality: N/A;  . LOWER EXTREMITY ANGIOGRAPHY Left 02/12/2018   Procedure: LOWER EXTREMITY ANGIOGRAPHY;  Surgeon: Algernon Huxley, MD;  Location: [facility];  Service: Cardiovascular;  Laterality: Left;  . LOWER EXTREMITY ANGIOGRAPHY Left 03/07/2018   Procedure: LOWER EXTREMITY ANGIOGRAPHY;  Surgeon: Algernon Huxley, MD;  Location: [facility];  Service: Cardiovascular;  Laterality: Left;  . LOWER EXTREMITY ANGIOGRAPHY  Left 06/04/2018   Procedure: LOWER EXTREMITY ANGIOGRAPHY;  Surgeon: Algernon Huxley, MD;  Location: [facility];  Service: Cardiovascular;  Laterality: Left;  . POLYPECTOMY N/A 09/13/2018   Procedure: POLYPECTOMY;  Surgeon: [name], MD;  Location: [facility];  Service: Endoscopy;  Laterality: N/A;  . POLYPECTOMY N/A 02/11/2019   Procedure: POLYPECTOMY;  Surgeon: [name], MD;  Location: [place];  Service: Endoscopy;  Laterality: N/A;  . SPINE SURGERY    . TONSILLECTOMY      Social  History   Tobacco Use  . Smoking status: Former Smoker    Packs/day: 1.25    Years: 35.00    Pack years: 43.75    Types: Cigarettes    Quit date: 02/28/2005    Years since quitting: 14.2  . Smokeless tobacco: Current User    Types: Snuff  Substance Use Topics  . Alcohol use: Yes    Alcohol/week: 8.0 - 10.0 standard drinks    Types: 8 - 10 Glasses of wine per week  . Drug use: No     Medication list has been reviewed and updated.  Current Meds  Medication Sig  . Alirocumab (PRALUENT) 150 MG/ML SOAJ Inject 1 pen into the skin every 14 (fourteen) days.  Marland Kitchen amLODipine (NORVASC) 5 MG tablet TAKE 1 TABLET BY MOUTH EVERY DAY  . aspirin EC 81 MG tablet Take 1 tablet (81 mg total) by mouth daily.  . clopidogrel (PLAVIX) 75 MG tablet TAKE ONE TABLET BY MOUTH DAILY.  . cyclobenzaprine (FLEXERIL) 10 MG tablet Take 1 tablet (10 mg total) by mouth 3 (three) times daily as needed for muscle spasms.  Marland Kitchen HYDROcodone-acetaminophen (NORCO/VICODIN) 5-325 MG tablet Take 1 tablet by mouth every 6 (six) hours as needed for severe pain. (Patient taking differently: Take 1 tablet by mouth every 6 (six) hours as needed for severe pain. AS NEEDED)  . lisinopril (ZESTRIL) 20 MG tablet TAKE 1 TABLET BY MOUTH EVERY DAY  . meloxicam (MOBIC) 7.5 MG tablet Take 1 tablet (7.5 mg total) by mouth daily. (Patient taking differently: Take 7.5 mg by mouth as needed. )  . metoprolol succinate (TOPROL-XL) 25 MG 24 hr tablet TAKE 1 TABLET BY MOUTH EVERY DAY  . nitroGLYCERIN (NITROSTAT) 0.4 MG SL tablet Place 1 tablet (0.4 mg total) under the tongue every 5 (five) minutes as needed for chest pain.  . pantoprazole (PROTONIX) 40 MG tablet TAKE 1 TABLET BY MOUTH EVERY DAY    PHQ 2/9 Scores 05/21/2019 09/12/2018 09/06/2017 05/01/2017  PHQ - 2 Score 0 0 0 0  PHQ- 9 Score 0 - 0 1    BP Readings from Last 3 Encounters:  05/21/19 122/72  03/06/19 120/60  02/11/19 123/84    Physical Exam Vitals and nursing note reviewed.    Constitutional:      Appearance: Normal appearance. He is well-developed.  HENT:     Head: Normocephalic.     Right Ear: Tympanic membrane, ear canal and external ear normal.     Left Ear: Tympanic membrane, ear canal and external ear normal.     Nose: Nose normal.     Mouth/Throat:     Pharynx: Uvula midline.  Eyes:     Conjunctiva/sclera: Conjunctivae normal.     Pupils: Pupils are equal, round, and reactive to light.  Neck:     Thyroid: No thyromegaly.     Vascular: No carotid bruit.  Cardiovascular:     Rate and Rhythm: Normal rate and regular rhythm.  No extrasystoles are present.    Pulses:          Dorsalis pedis pulses are 1+ on the right side and 1+ on the left side.       Posterior tibial pulses are 0 on the right side and 0 on the left side.     Heart sounds: Normal heart sounds.  Pulmonary:     Effort: Pulmonary effort is normal.     Breath sounds: Normal breath sounds. No wheezing.  Chest:     Breasts:        Right: No mass.        Left: No mass.  Abdominal:     General: Bowel sounds are normal.     Palpations: Abdomen is soft.     Tenderness: There is no abdominal tenderness.  Musculoskeletal:     Cervical back: Normal range of motion and neck supple.     Left hip: Tenderness present. Decreased range of motion.     Right lower leg: No edema.     Left lower leg: No edema.  Lymphadenopathy:     Cervical: No cervical adenopathy.  Skin:    General: Skin is warm and dry.     Capillary Refill: Capillary refill takes less than 2 seconds.  Neurological:     Mental Status: He is alert and oriented to person, place, and time.     Deep Tendon Reflexes: Reflexes are normal and symmetric.  Psychiatric:        Attention and Perception: Attention normal.        Mood and Affect: Mood normal.        Speech: Speech normal.        Behavior: Behavior normal.        Thought Content: Thought content normal.     Wt Readings from Last 3 Encounters:  05/21/19 176 lb  (79.8 kg)  03/06/19 185 lb (83.9 kg)  02/11/19 180 lb (81.6 kg)    BP 122/72   Pulse 62   Temp 97.9 F (36.6 C) (Temporal)   Ht 5\' 6"  (1.676 m)   Wt 176 lb (79.8 kg)   SpO2 96%   BMI 28.41 kg/m   Assessment and Plan: 1. Annual physical exam Normal exam Continue healthy diet, exercise as able - POCT urinalysis dipstick  2. Need for hepatitis C screening test - Hepatitis C antibody  3. Essential hypertension Clinically stable exam with well controlled BP. Tolerating medications without side effects at this time. Pt to continue current regimen and low sodium diet; benefits of regular exercise as able discussed. CAD stable - followed by Cardiology - CBC with Differential/Platelet - Comprehensive metabolic panel  4. Hyperlipidemia LDL goal <70 Intolerant of statins Most recent lipid panel was acceptable  5. Coronary artery disease of native artery of native heart with stable angina pectoris ([place]) Doing well on current therapy No CP or SOB  6. Prostate cancer screening DRE deferred - PSA  7. Degenerative disc disease, thoracic - traMADol (ULTRAM) 50 MG tablet; Take 1 tablet (50 mg total) by mouth daily as needed for moderate pain.  Dispense: 30 tablet; Refill: 0   Partially dictated using Editor, commissioning. Any errors are unintentional.  Halina Maidens, MD [facility]  05/21/2019

## 2019-05-22 LAB — COMPREHENSIVE METABOLIC PANEL
ALT: 29 IU/L (ref 0–44)
AST: 24 IU/L (ref 0–40)
Albumin/Globulin Ratio: 1.6 (ref 1.2–2.2)
Albumin: 4.4 g/dL (ref 3.8–4.8)
Alkaline Phosphatase: 82 IU/L (ref 39–117)
BUN/Creatinine Ratio: 24 (ref 10–24)
BUN: 24 mg/dL (ref 8–27)
Bilirubin Total: 0.5 mg/dL (ref 0.0–1.2)
CO2: 22 mmol/L (ref 20–29)
Calcium: 9.1 mg/dL (ref 8.6–10.2)
Chloride: 101 mmol/L (ref 96–106)
Creatinine, Ser: 1 mg/dL (ref 0.76–1.27)
GFR calc Af Amer: 88 mL/min/{1.73_m2} (ref >59)
GFR calc non Af Amer: 76 mL/min/{1.73_m2} (ref >59)
Globulin, Total: 2.8 g/dL (ref 1.5–4.5)
Glucose: 108 mg/dL — ABNORMAL HIGH (ref 65–99)
Potassium: 4.6 mmol/L (ref 3.5–5.2)
Sodium: 138 mmol/L (ref 134–144)
Total Protein: 7.2 g/dL (ref 6.0–8.5)

## 2019-05-22 LAB — CBC WITH DIFFERENTIAL/PLATELET
Basophils Absolute: 0.1 10*3/uL (ref 0.0–0.2)
Basos: 1 %
EOS (ABSOLUTE): 0.3 10*3/uL (ref 0.0–0.4)
Eos: 8 %
Hematocrit: 41.5 % (ref 37.5–51.0)
Hemoglobin: 14.8 g/dL (ref 13.0–17.7)
Immature Grans (Abs): 0 10*3/uL (ref 0.0–0.1)
Immature Granulocytes: 1 %
Lymphocytes Absolute: 1.4 10*3/uL (ref 0.7–3.1)
Lymphs: 37 %
MCH: 31.8 pg (ref 26.6–33.0)
MCHC: 35.7 g/dL (ref 31.5–35.7)
MCV: 89 fL (ref 79–97)
Monocytes Absolute: 0.6 10*3/uL (ref 0.1–0.9)
Monocytes: 15 %
Neutrophils Absolute: 1.4 10*3/uL (ref 1.4–7.0)
Neutrophils: 38 %
Platelets: 200 10*3/uL (ref 150–450)
RBC: 4.66 x10E6/uL (ref 4.14–5.80)
RDW: 12 % (ref 11.6–15.4)
WBC: 3.7 10*3/uL (ref 3.4–10.8)

## 2019-05-22 LAB — PSA: Prostate Specific Ag, Serum: 0.9 ng/mL (ref 0.0–4.0)

## 2019-05-22 LAB — HEPATITIS C ANTIBODY: Hep C Virus Ab: <0.1 s/co ratio (ref 0.0–0.9)

## 2019-05-27 ENCOUNTER — Other Ambulatory Visit: Payer: Self-pay | Admitting: Gastroenterology

## 2019-06-20 ENCOUNTER — Other Ambulatory Visit: Payer: Self-pay | Admitting: Gastroenterology

## 2019-07-02 DIAGNOSIS — M7632 Iliotibial band syndrome, left leg: Secondary | ICD-10-CM | POA: Diagnosis not present

## 2019-07-02 DIAGNOSIS — M76892 Other specified enthesopathies of left lower limb, excluding foot: Secondary | ICD-10-CM | POA: Diagnosis not present

## 2019-07-15 ENCOUNTER — Other Ambulatory Visit: Payer: Self-pay | Admitting: Internal Medicine

## 2019-07-16 ENCOUNTER — Telehealth: Payer: Self-pay | Admitting: Internal Medicine

## 2019-07-16 NOTE — Telephone Encounter (Signed)
Patient needs medication assistance renewal for praluent.  Please call.

## 2019-07-16 NOTE — Telephone Encounter (Signed)
Spoke with patient. States after he left a message with me he called the Praluent patient assistance. They reactivated his patient assistance information.  He called the pharmacy and gave them the new info and should be able to pick up the medication tomorrow. He will let us know if any further questions or concerns arise.

## 2019-07-16 NOTE — Telephone Encounter (Signed)
Called patient's pharmacy. Pharmacist said that it would cost patient $90 for 1 month supply. Prior to now patient was receiving a grant from [employer]) for the medication.  Tried to reach patient to discuss. No answer. Left message to call back.

## 2019-07-19 ENCOUNTER — Encounter (INDEPENDENT_AMBULATORY_CARE_PROVIDER_SITE_OTHER): Payer: PPO

## 2019-07-19 ENCOUNTER — Ambulatory Visit (INDEPENDENT_AMBULATORY_CARE_PROVIDER_SITE_OTHER): Payer: PPO | Admitting: Vascular Surgery

## 2019-07-30 ENCOUNTER — Other Ambulatory Visit: Payer: Self-pay | Admitting: Internal Medicine

## 2019-07-30 DIAGNOSIS — I10 Essential (primary) hypertension: Secondary | ICD-10-CM

## 2019-08-12 DIAGNOSIS — L570 Actinic keratosis: Secondary | ICD-10-CM | POA: Diagnosis not present

## 2019-08-12 DIAGNOSIS — L821 Other seborrheic keratosis: Secondary | ICD-10-CM | POA: Diagnosis not present

## 2019-08-26 ENCOUNTER — Encounter: Payer: Self-pay | Admitting: Internal Medicine

## 2019-08-26 ENCOUNTER — Other Ambulatory Visit: Payer: Self-pay

## 2019-08-26 ENCOUNTER — Ambulatory Visit (INDEPENDENT_AMBULATORY_CARE_PROVIDER_SITE_OTHER): Payer: PPO | Admitting: Internal Medicine

## 2019-08-26 VITALS — BP 102/64 | HR 74 | Ht 66.0 in | Wt 181.0 lb

## 2019-08-26 DIAGNOSIS — H6122 Impacted cerumen, left ear: Secondary | ICD-10-CM | POA: Diagnosis not present

## 2019-08-26 DIAGNOSIS — R0602 Shortness of breath: Secondary | ICD-10-CM

## 2019-08-26 NOTE — Progress Notes (Signed)
Date:  08/26/2019   Name:  [name]   DOB:  05-23-1950   MRN:  [id number]   Chief Complaint: Ear Fullness (Both ears are "stopped up." Left ear is 100% stopped up. Right is a little less. 3-4 days.  Not painful. ) and Shortness of Breath (Says this is very little- says he is noticing some changing in his breathing. No chest pain .)  Shortness of Breath This is a chronic problem. The problem occurs daily. The problem has been unchanged. Pertinent negatives include no abdominal pain, chest pain, ear pain, fever, headaches, leg swelling or wheezing. He has tried nothing for the symptoms.   Ear fullness - onset about a week ago.  Hearing is muffled but no pain.  Used a home ear wax kit this weekend with no improvement.  Left is much work than the right.  Lab Results  Component Value Date   CREATININE 1.00 05/21/2019   BUN 24 05/21/2019   NA 138 05/21/2019   K 4.6 05/21/2019   CL 101 05/21/2019   CO2 22 05/21/2019   Lab Results  Component Value Date   CHOL 144 12/10/2018   HDL 59 12/10/2018   LDLCALC 68 12/10/2018   TRIG 88 12/10/2018   CHOLHDL 2.4 12/10/2018   Lab Results  Component Value Date   TSH 1.420 09/06/2017   Lab Results  Component Value Date   HGBA1C 5.6 09/06/2017   Lab Results  Component Value Date   WBC 3.7 05/21/2019   HGB 14.8 05/21/2019   HCT 41.5 05/21/2019   MCV 89 05/21/2019   PLT 200 05/21/2019   Lab Results  Component Value Date   ALT 29 05/21/2019   AST 24 05/21/2019   ALKPHOS 82 05/21/2019   BILITOT 0.5 05/21/2019     Review of Systems  Constitutional: Negative for chills, fatigue and fever.  HENT: Positive for hearing loss. Negative for congestion, ear discharge and ear pain.   Respiratory: Positive for shortness of breath. Negative for cough, chest tightness and wheezing.   Cardiovascular: Negative for chest pain, palpitations and leg swelling.  Gastrointestinal: Negative for abdominal pain.  Musculoskeletal: Negative for  arthralgias and gait problem.  Neurological: Negative for dizziness, light-headedness and headaches.    Patient Active Problem List   Diagnosis Date Noted  . Stricture and stenosis of esophagus   . Encounter for screening colonoscopy   . Polyp of colon   . Rectal polyp   . Coronary artery disease involving native coronary artery of native heart without angina pectoris 08/13/2018  . PAD (peripheral artery disease) ([place]) 08/13/2018  . Carotid stenosis 07/17/2018  . Atherosclerosis of artery of extremity with rest pain ([place]) 06/04/2018  . Chronic systolic heart failure ([place]) 03/12/2018  . Nonischemic cardiomyopathy ([place]) 12/08/2017  . Myalgia due to statin 12/08/2017  . 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 ([place]) 05/01/2017  . Foot pain, bilateral 05/01/2017  . 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 Tobacco use disorder, moderate, in sustained remission 07/13/2016  . Overweight (BMI 25.0-29.9) 07/13/2016  . 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[place]) 07/13/2016  . Dysphagia 11/26/2015  . Chronic anticoagulation: plavix 10/15/2015  . Chronic left hip pain 09/15/2015  . Coronary artery disease of native heart with stable angina pectoris ([place]) 08/08/2015  . Essential hypertension 08/08/2015  . Hyperlipidemia LDL goal <70 01/29/2013  . History of CEA (carotid endarterectomy) 11/08/2011    Allergies  Allergen Reactions  . Brilinta [Ticagrelor]     Shortness of  breath  . Zetia [Ezetimibe] Other (See Comments)    Muscle aches  . Statins Other (See Comments)    Failed Crestor 5 mg twice weekly, Crestor 20 mg daily, Pravastatin 40 mg qd, Lipitor, Zocor - muscle aches    Past Surgical History:  Procedure Laterality Date  . APPENDECTOMY    . BACK SURGERY    . CARDIAC CATHETERIZATION N/A 08/07/2015   Procedure: Left Heart Cath and Coronary Angiography;  Surgeon: [name], MD;  Location: [facility];  Service: Cardiovascular;  Laterality: N/A;  . CARDIAC  CATHETERIZATION N/A 08/07/2015   Procedure: Coronary Stent Intervention;  Surgeon: [name], MD;  Location: [facility];  Service: Cardiovascular;  Laterality: N/A;  . CARDIAC CATHETERIZATION N/A 08/07/2015   Procedure: Coronary Stent Intervention;  Surgeon: [name], MD;  Location: [facility];  Service: Cardiovascular;  Laterality: N/A;  . CAROTID ENDARTERECTOMY  01/05/2006   Right  CEA with DPA  . CATARACT EXTRACTION W/ INTRAOCULAR LENS IMPLANT Left 12/04/2017  . CATARACT EXTRACTION W/PHACO Left 12/04/2017   Procedure: CATARACT EXTRACTION PHACO AND INTRAOCULAR LENS PLACEMENT ([place]) LEFT;  Surgeon: Eulogio Bear, MD;  Location: [place] [facility];  Service: Ophthalmology;  Laterality: Left;  . CATARACT EXTRACTION W/PHACO Right 02/06/2018   Procedure: CATARACT EXTRACTION PHACO AND INTRAOCULAR LENS PLACEMENT (IOC)RIGHT;  Surgeon: Eulogio Bear, MD;  Location: [place];  Service: Ophthalmology;  Laterality: Right;  . COLONOSCOPY  05/20/2008  . COLONOSCOPY WITH PROPOFOL N/A 09/13/2018   Procedure: COLONOSCOPY WITH BIOPSY;  Surgeon: [name], MD;  Location: [facility] [place];  Service: Endoscopy;  Laterality: N/A;  . CORONARY STENT PLACEMENT  08/07/2015   MID CIRCUMFLEX  . ESOPHAGOGASTRODUODENOSCOPY (EGD) WITH PROPOFOL N/A 02/11/2019   Procedure: ESOPHAGOGASTRODUODENOSCOPY (EGD) WITH BIOPSY and  Dilation;  Surgeon: [name], MD;  Location: [facility];  Service: Endoscopy;  Laterality: N/A;  . HIP SURGERY Left 10/2016   left hip tendon repair  . LEFT HEART CATH AND CORONARY ANGIOGRAPHY N/A 11/27/2017   Procedure: LEFT HEART CATH AND CORONARY ANGIOGRAPHY;  Surgeon: Wellington Hampshire, MD;  Location: [facility];  Service: Cardiovascular;  Laterality: N/A;  . LOWER EXTREMITY ANGIOGRAPHY Left 02/12/2018   Procedure: LOWER EXTREMITY ANGIOGRAPHY;  Surgeon: Algernon Huxley, MD;  Location: [facility];  Service: Cardiovascular;   Laterality: Left;  . LOWER EXTREMITY ANGIOGRAPHY Left 03/07/2018   Procedure: LOWER EXTREMITY ANGIOGRAPHY;  Surgeon: Algernon Huxley, MD;  Location: [place] [facility];  Service: Cardiovascular;  Laterality: Left;  . LOWER EXTREMITY ANGIOGRAPHY Left 06/04/2018   Procedure: LOWER EXTREMITY ANGIOGRAPHY;  Surgeon: Algernon Huxley, MD;  Location: [place] [facility];  Service: Cardiovascular;  Laterality: Left;  . POLYPECTOMY N/A 09/13/2018   Procedure: POLYPECTOMY;  Surgeon: [name], MD;  Location: [place];  Service: Endoscopy;  Laterality: N/A;  . POLYPECTOMY N/A 02/11/2019   Procedure: POLYPECTOMY;  Surgeon: [name], MD;  Location: [place];  Service: Endoscopy;  Laterality: N/A;  . SPINE SURGERY    . TONSILLECTOMY      Social History   Tobacco Use  . Smoking status: Former Smoker    Packs/day: 1.25    Years: 35.00    Pack years: 43.75    Types: Cigarettes    Quit date: 02/28/2005    Years since quitting: 14.4  . Smokeless tobacco: Current User    Types: Snuff  Vaping Use  . Vaping Use:  Never used  Substance Use Topics  . Alcohol use: Yes    Alcohol/week: 8.0 - 10.0 standard drinks    Types: 8 - 10 Glasses of wine per week  . Drug use: No     Medication list has been reviewed and updated.  Current Meds  Medication Sig  . amLODipine (NORVASC) 5 MG tablet TAKE 1 TABLET BY MOUTH EVERY DAY  . aspirin EC 81 MG tablet Take 1 tablet (81 mg total) by mouth daily.  . clopidogrel (PLAVIX) 75 MG tablet TAKE ONE TABLET BY MOUTH DAILY.  . cyclobenzaprine (FLEXERIL) 10 MG tablet Take 1 tablet (10 mg total) by mouth 3 (three) times daily as needed for muscle spasms.  Marland Kitchen HYDROcodone-acetaminophen (NORCO/VICODIN) 5-325 MG tablet Take 1 tablet by mouth every 6 (six) hours as needed for severe pain. (Patient taking differently: Take 1 tablet by mouth every 6 (six) hours as needed for severe pain. AS NEEDED)  . lisinopril (ZESTRIL) 20 MG tablet TAKE 1 TABLET BY MOUTH  EVERY DAY  . meloxicam (MOBIC) 7.5 MG tablet Take 1 tablet (7.5 mg total) by mouth daily. (Patient taking differently: Take 7.5 mg by mouth as needed. )  . metoprolol succinate (TOPROL-XL) 25 MG 24 hr tablet TAKE 1 TABLET BY MOUTH EVERY DAY  . nitroGLYCERIN (NITROSTAT) 0.4 MG SL tablet Place 1 tablet (0.4 mg total) under the tongue every 5 (five) minutes as needed for chest pain.  . pantoprazole (PROTONIX) 40 MG tablet TAKE 1 TABLET BY MOUTH EVERY DAY  . PRALUENT 150 MG/ML SOAJ INJECT 1 PEN INTO THE SKIN EVERY 14 (FOURTEEN) DAYS.    PHQ 2/9 Scores 08/26/2019 05/21/2019 09/12/2018 09/06/2017  PHQ - 2 Score 0 0 0 0  PHQ- 9 Score 0 0 - 0    GAD 7 : Generalized Anxiety Score 08/26/2019 05/21/2019  Nervous, Anxious, on Edge 0 0  Control/stop worrying 0 0  Worry too much - different things 0 0  Trouble relaxing 0 0  Restless 0 0  Easily annoyed or irritable 0 0  Afraid - awful might happen 0 0  Total GAD 7 Score 0 0  Anxiety Difficulty Not difficult at all Not difficult at all    BP Readings from Last 3 Encounters:  08/26/19 102/64  05/21/19 122/72  03/06/19 120/60    Physical Exam Vitals and nursing note reviewed.  Constitutional:      General: He is not in acute distress.    Appearance: He is well-developed.  HENT:     Head: Normocephalic and atraumatic.     Right Ear: Tympanic membrane normal. There is no impacted cerumen.     Left Ear: There is impacted cerumen.     Ears:     Comments: Erythema of the left canal Pulmonary:     Effort: Pulmonary effort is normal. No respiratory distress.     Breath sounds: No decreased breath sounds, wheezing or rhonchi.  Musculoskeletal:        General: Normal range of motion.  Skin:    General: Skin is warm and dry.     Findings: No rash.  Neurological:     Mental Status: He is alert and oriented to person, place, and time.  Psychiatric:        Behavior: Behavior normal.        Thought Content: Thought content normal.     Wt  Readings from Last 3 Encounters:  08/26/19 181 lb (82.1 kg)  05/21/19 176 lb (79.8 kg)  03/06/19  185 lb (83.9 kg)    BP 102/64 (BP Location: Right Arm, Patient Position: Sitting, Cuff Size: Normal)   Pulse 74   Ht _0  (1.676 m)   Wt 181 lb (82.1 kg)   SpO2 93%   BMI 29.21 kg/m   Assessment and Plan: 1. Impacted cerumen of left ear Recommend ENT to remove wax that is apparently abutting the TM - Ambulatory referral to ENT  2. Shortness of breath Hx of tobacco use Recent Chest CT reviewed - mild centrilobular and paraseptal emphysematous changes seen Sample of Breo 100 given - one inhalation daily   Partially dictated using Editor, commissioning. Any errors are unintentional.  Halina Maidens, MD [facility]  08/26/2019

## 2019-08-29 DIAGNOSIS — H6122 Impacted cerumen, left ear: Secondary | ICD-10-CM | POA: Diagnosis not present

## 2019-09-12 ENCOUNTER — Telehealth: Payer: Self-pay | Admitting: Gastroenterology

## 2019-09-12 ENCOUNTER — Other Ambulatory Visit: Payer: Self-pay | Admitting: Gastroenterology

## 2019-09-12 NOTE — Telephone Encounter (Signed)
Pt states his previous GI prescribed medication Pantoprazole, and wants to know if Dr. Allen Norris and can send prescription in to [facility] in [place] since pt now sees him.

## 2019-09-13 ENCOUNTER — Telehealth: Payer: Self-pay | Admitting: *Deleted

## 2019-09-13 ENCOUNTER — Other Ambulatory Visit: Payer: Self-pay

## 2019-09-13 MED ORDER — PANTOPRAZOLE SODIUM 40 MG PO TBEC: 40.0000 mg | DELAYED_RELEASE_TABLET | Freq: Every day | ORAL | 1 refills | Status: DC

## 2019-09-13 NOTE — Telephone Encounter (Signed)
Completed call/medical record check for CLEAR T17 - M45 visit. All medications have been updated. No AE's/SAE's to report at this time. Next phone call confirmed.

## 2019-09-13 NOTE — Telephone Encounter (Signed)
Left vm informing pt that the Pantoprazole has been sent to his pharmacy. Advised pt to contact our office to schedule his 1 year follow up appt before November 2021.

## 2019-09-16 ENCOUNTER — Ambulatory Visit: Payer: PPO

## 2019-09-23 ENCOUNTER — Ambulatory Visit (INDEPENDENT_AMBULATORY_CARE_PROVIDER_SITE_OTHER): Payer: PPO

## 2019-09-23 DIAGNOSIS — Z0000 Encounter for general adult medical examination without abnormal findings: Secondary | ICD-10-CM

## 2019-09-23 NOTE — Patient Instructions (Signed)
[name] , Thank you for taking time to come for your Medicare Wellness Visit. I appreciate your ongoing commitment to your health goals. Please review the following plan we discussed and let me know if I can assist you in the future.   Screening recommendations/referrals: Colonoscopy: done 09/13/18. Repeat in 2025. Recommended yearly ophthalmology/optometry visit for glaucoma screening and checkup Recommended yearly dental visit for hygiene and checkup  Vaccinations: Influenza vaccine: done 11/23/18 Pneumococcal vaccine: done 01/06/17 Tdap vaccine: done 04/18/12 Shingles vaccine: Shingrix discussed. Please contact your pharmacy for coverage information.  Covid-19: done 2/9/2 7 04/30/19  Advanced directives: Please bring a copy of your health care power of attorney and living will to the office at your convenience.  Conditions/risks identified: Recommend drinking 6-8 glasses of water per day  Next appointment: Follow up in one year for your annual wellness visit.   Preventive Care 9 Years and Older, Male Preventive care refers to lifestyle choices and visits with your health care provider that can promote health and wellness. What does preventive care include?  A yearly physical exam. This is also called an annual well check.  Dental exams once or twice a year.  Routine eye exams. Ask your health care provider how often you should have your eyes checked.  Personal lifestyle choices, including:  Daily care of your teeth and gums.  Regular physical activity.  Eating a healthy diet.  Avoiding tobacco and drug use.  Limiting alcohol use.  Practicing safe sex.  Taking low doses of aspirin every day.  Taking vitamin and mineral supplements as recommended by your health care provider. What happens during an annual well check? The services and screenings done by your health care provider during your annual well check will depend on your age, overall health, lifestyle risk factors,  and family history of disease. Counseling  Your health care provider may ask you questions about your:  Alcohol use.  Tobacco use.  Drug use.  Emotional well-being.  Home and relationship well-being.  Sexual activity.  Eating habits.  History of falls.  Memory and ability to understand (cognition).  Work and work Statistician. Screening  You may have the following tests or measurements:  Height, weight, and BMI.  Blood pressure.  Lipid and cholesterol levels. These may be checked every 5 years, or more frequently if you are over 29 years old.  Skin check.  Lung cancer screening. You may have this screening every year starting at age 79 if you have a 30-pack-year history of smoking and currently smoke or have quit within the past 15 years.  Fecal occult blood test (FOBT) of the stool. You may have this test every year starting at age 2.  Flexible sigmoidoscopy or colonoscopy. You may have a sigmoidoscopy every 5 years or a colonoscopy every 10 years starting at age 59.  Prostate cancer screening. Recommendations will vary depending on your family history and other risks.  Hepatitis C blood test.  Hepatitis B blood test.  Sexually transmitted disease (STD) testing.  Diabetes screening. This is done by checking your blood sugar (glucose) after you have not eaten for a while (fasting). You may have this done every 1-3 years.  Abdominal aortic aneurysm (AAA) screening. You may need this if you are a current or former smoker.  Osteoporosis. You may be screened starting at age 61 if you are at high risk. Talk with your health care provider about your test results, treatment options, and if necessary, the need for more tests. Vaccines  Your health care provider may recommend certain vaccines, such as:  Influenza vaccine. This is recommended every year.  Tetanus, diphtheria, and acellular pertussis (Tdap, Td) vaccine. You may need a Td booster every 10  years.  Zoster vaccine. You may need this after age 65.  Pneumococcal 13-valent conjugate (PCV13) vaccine. One dose is recommended after age 26.  Pneumococcal polysaccharide (PPSV23) vaccine. One dose is recommended after age 60. Talk to your health care provider about which screenings and vaccines you need and how often you need them. This information is not intended to replace advice given to you by your health care provider. Make sure you discuss any questions you have with your health care provider. Document Released: 03/13/2015 Document Revised: 11/04/2015 Document Reviewed: 12/16/2014 Elsevier Interactive Patient Education  2017 [place] Prevention in the Home Falls can cause injuries. They can happen to people of all ages. There are many things you can do to make your home safe and to help prevent falls. What can I do on the outside of my home?  Regularly fix the edges of walkways and driveways and fix any cracks.  Remove anything that might make you trip as you walk through a door, such as a raised step or threshold.  Trim any bushes or trees on the path to your home.  Use bright outdoor lighting.  Clear any walking paths of anything that might make someone trip, such as rocks or tools.  Regularly check to see if handrails are loose or broken. Make sure that both sides of any steps have handrails.  Any raised decks and porches should have guardrails on the edges.  Have any leaves, snow, or ice cleared regularly.  Use sand or salt on walking paths during winter.  Clean up any spills in your garage right away. This includes oil or grease spills. What can I do in the bathroom?  Use night lights.  Install grab bars by the toilet and in the tub and shower. Do not use towel bars as grab bars.  Use non-skid mats or decals in the tub or shower.  If you need to sit down in the shower, use a plastic, non-slip stool.  Keep the floor dry. Clean up any water that  spills on the floor as soon as it happens.  Remove soap buildup in the tub or shower regularly.  Attach bath mats securely with double-sided non-slip rug tape.  Do not have throw rugs and other things on the floor that can make you trip. What can I do in the bedroom?  Use night lights.  Make sure that you have a light by your bed that is easy to reach.  Do not use any sheets or blankets that are too big for your bed. They should not hang down onto the floor.  Have a firm chair that has side arms. You can use this for support while you get dressed.  Do not have throw rugs and other things on the floor that can make you trip. What can I do in the kitchen?  Clean up any spills right away.  Avoid walking on wet floors.  Keep items that you use a lot in easy-to-reach places.  If you need to reach something above you, use a strong step stool that has a grab bar.  Keep electrical cords out of the way.  Do not use floor polish or wax that makes floors slippery. If you must use wax, use non-skid floor wax.  Do  not have throw rugs and other things on the floor that can make you trip. What can I do with my stairs?  Do not leave any items on the stairs.  Make sure that there are handrails on both sides of the stairs and use them. Fix handrails that are broken or loose. Make sure that handrails are as [place] as the stairways.  Check any carpeting to make sure that it is firmly attached to the stairs. Fix any carpet that is loose or worn.  Avoid having throw rugs at the top or bottom of the stairs. If you do have throw rugs, attach them to the floor with carpet tape.  Make sure that you have a light switch at the top of the stairs and the bottom of the stairs. If you do not have them, ask someone to add them for you. What else can I do to help prevent falls?  Wear shoes that:  Do not have high heels.  Have rubber bottoms.  Are comfortable and fit you well.  Are closed at the  toe. Do not wear sandals.  If you use a stepladder:  Make sure that it is fully opened. Do not climb a closed stepladder.  Make sure that both sides of the stepladder are locked into place.  Ask someone to hold it for you, if possible.  Clearly mark and make sure that you can see:  Any grab bars or handrails.  First and last steps.  Where the edge of each step is.  Use tools that help you move around (mobility aids) if they are needed. These include:  Canes.  Walkers.  Scooters.  Crutches.  Turn on the lights when you go into a dark area. Replace any light bulbs as soon as they burn out.  Set up your furniture so you have a clear path. Avoid moving your furniture around.  If any of your floors are uneven, fix them.  If there are any pets around you, be aware of where they are.  Review your medicines with your doctor. Some medicines can make you feel dizzy. This can increase your chance of falling. Ask your doctor what other things that you can do to help prevent falls. This information is not intended to replace advice given to you by your health care provider. Make sure you discuss any questions you have with your health care provider. Document Released: 12/11/2008 Document Revised: 07/23/2015 Document Reviewed: 03/21/2014 Elsevier Interactive Patient Education  2017 [employer].

## 2019-09-23 NOTE — Progress Notes (Signed)
Subjective:   [name] is a 69 y.o. male who presents for Medicare Annual/Subsequent preventive examination.  Virtual Visit via Telephone Note  I connected with  Kerri Asche Labine on 09/23/19 at  8:00 AM EDT by telephone and verified that I am speaking with the correct person using two identifiers.  Medicare Annual Wellness visit completed telephonically due to Covid-19 pandemic.   Location: Patient: home Provider: office   I discussed the limitations, risks, security and privacy concerns of performing an evaluation and management service by telephone and the availability of in person appointments. The patient expressed understanding and agreed to proceed.  Unable to perform video visit due to video visit attempted and failed and/or patient does not have video capability.   Some vital signs may be absent or patient reported.   [name], LPN    Review of Systems     Cardiac Risk Factors include: dyslipidemia;advanced age (>35men, >75 women);hypertension;male gender;smoking/ tobacco exposure     Objective:    There were no vitals filed for this visit. There is no height or weight on file to calculate BMI.  Advanced Directives 09/23/2019 02/11/2019 09/13/2018 09/12/2018 06/04/2018 06/04/2018 03/07/2018  Does Patient Have a Medical Advance Directive? Yes Yes Yes Yes Yes Yes -  Type of Paramedic of [place];Living will [place];Living will [place];Living will [place];Living will [place];Living will [place];Living will -  Does patient want to make changes to medical advance directive? - No - Patient declined No - Patient declined - No - Patient declined No - Patient declined No - Patient declined  Copy of [place] in Chart? No - copy requested No - copy requested No - copy requested No - copy requested No - copy requested No - copy requested  No - copy requested  Would patient like information on creating a medical advance directive? - - - - - - -    Current Medications (verified) Outpatient Encounter Medications as of 09/23/2019  Medication Sig  . amLODipine (NORVASC) 5 MG tablet TAKE 1 TABLET BY MOUTH EVERY DAY  . aspirin EC 81 MG tablet Take 1 tablet (81 mg total) by mouth daily.  . clopidogrel (PLAVIX) 75 MG tablet TAKE ONE TABLET BY MOUTH DAILY.  . cyclobenzaprine (FLEXERIL) 10 MG tablet Take 1 tablet (10 mg total) by mouth 3 (three) times daily as needed for muscle spasms.  Marland Kitchen lisinopril (ZESTRIL) 20 MG tablet TAKE 1 TABLET BY MOUTH EVERY DAY  . meloxicam (MOBIC) 7.5 MG tablet Take 1 tablet (7.5 mg total) by mouth daily. (Patient taking differently: Take 7.5 mg by mouth as needed. )  . metoprolol succinate (TOPROL-XL) 25 MG 24 hr tablet TAKE 1 TABLET BY MOUTH EVERY DAY  . pantoprazole (PROTONIX) 40 MG tablet Take 1 tablet (40 mg total) by mouth daily.  Marland Kitchen PRALUENT 150 MG/ML SOAJ INJECT 1 PEN INTO THE SKIN EVERY 14 (FOURTEEN) DAYS.  . HYDROcodone-acetaminophen (NORCO/VICODIN) 5-325 MG tablet Take 1 tablet by mouth every 6 (six) hours as needed for severe pain. (Patient not taking: Reported on 09/23/2019)  . nitroGLYCERIN (NITROSTAT) 0.4 MG SL tablet Place 1 tablet (0.4 mg total) under the tongue every 5 (five) minutes as needed for chest pain.   No facility-administered encounter medications on file as of 09/23/2019.    Allergies (verified) Brilinta [ticagrelor], Zetia [ezetimibe], and Statins   History: Past Medical History:  Diagnosis Date  .  Abnormal nuclear cardiac imaging test 08/08/2015  . Arthritis    fingers  . Carotid artery occlusion   . Coronary atherosclerosis of native coronary artery 01/29/2013   stent  . Duodenal erosion   . Encounter for screening for lung cancer 07/13/2016  . Esophageal stenosis    esophageal dilation  . GERD (gastroesophageal reflux disease)   . H. pylori infection   . Heart attack  [facility]) Oct. 2009   Mild  . Hiatal hernia   . Hyperlipidemia   . Hypertension   . 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 Cardiac catheterization-nonobstructive 60% RCA disease-subsequent nuclear stress test-9 minutes, low risk, mild inferior wall hypokinesis   . Pain in limb 12/19/2017  . Peripheral vascular disease ([place])   . Unstable angina ([place]) 11/25/2017   Past Surgical History:  Procedure Laterality Date  . APPENDECTOMY    . BACK SURGERY    . CARDIAC CATHETERIZATION N/A 08/07/2015   Procedure: Left Heart Cath and Coronary Angiography;  Surgeon: [name], MD;  Location: [place] [facility];  Service: Cardiovascular;  Laterality: N/A;  . CARDIAC CATHETERIZATION N/A 08/07/2015   Procedure: Coronary Stent Intervention;  Surgeon: [name], MD;  Location: [place] [facility];  Service: Cardiovascular;  Laterality: N/A;  . CARDIAC CATHETERIZATION N/A 08/07/2015   Procedure: Coronary Stent Intervention;  Surgeon: [name], MD;  Location: [place] [facility];  Service: Cardiovascular;  Laterality: N/A;  . CAROTID ENDARTERECTOMY  01/05/2006   Right  CEA with DPA  . CATARACT EXTRACTION W/ INTRAOCULAR LENS IMPLANT Left 12/04/2017  . CATARACT EXTRACTION W/PHACO Left 12/04/2017   Procedure: CATARACT EXTRACTION PHACO AND INTRAOCULAR LENS PLACEMENT ([place]) LEFT;  Surgeon: Eulogio Bear, MD;  Location: [place];  Service: Ophthalmology;  Laterality: Left;  . CATARACT EXTRACTION W/PHACO Right 02/06/2018   Procedure: CATARACT EXTRACTION PHACO AND INTRAOCULAR LENS PLACEMENT (I[facility])RIGHT;  Surgeon: Eulogio Bear, MD;  Location: [place];  Service: Ophthalmology;  Laterality: Right;  . COLONOSCOPY  05/20/2008  . COLONOSCOPY WITH PROPOFOL N/A 09/13/2018   Procedure: COLONOSCOPY WITH BIOPSY;  Surgeon: [name], MD;  Location: [place];  Service: Endoscopy;  Laterality: N/A;  . CORONARY STENT PLACEMENT   08/07/2015   MID CIRCUMFLEX  . ESOPHAGOGASTRODUODENOSCOPY (EGD) WITH PROPOFOL N/A 02/11/2019   Procedure: ESOPHAGOGASTRODUODENOSCOPY (EGD) WITH BIOPSY and  Dilation;  Surgeon: [name], MD;  Location: [place];  Service: Endoscopy;  Laterality: N/A;  . HIP SURGERY Left 10/2016   left hip tendon repair  . LEFT HEART CATH AND CORONARY ANGIOGRAPHY N/A 11/27/2017   Procedure: LEFT HEART CATH AND CORONARY ANGIOGRAPHY;  Surgeon: Wellington Hampshire, MD;  Location: [place] [facility];  Service: Cardiovascular;  Laterality: N/A;  . LOWER EXTREMITY ANGIOGRAPHY Left 02/12/2018   Procedure: LOWER EXTREMITY ANGIOGRAPHY;  Surgeon: Algernon Huxley, MD;  Location: [place] [facility];  Service: Cardiovascular;  Laterality: Left;  . LOWER EXTREMITY ANGIOGRAPHY Left 03/07/2018   Procedure: LOWER EXTREMITY ANGIOGRAPHY;  Surgeon: Algernon Huxley, MD;  Location: [place] [facility];  Service: Cardiovascular;  Laterality: Left;  . LOWER EXTREMITY ANGIOGRAPHY Left 06/04/2018   Procedure: LOWER EXTREMITY ANGIOGRAPHY;  Surgeon: Algernon Huxley, MD;  Location: [place] [facility];  Service: Cardiovascular;  Laterality: Left;  . POLYPECTOMY N/A 09/13/2018   Procedure: POLYPECTOMY;  Surgeon: [name], MD;  Location: [place];  Service: Endoscopy;  Laterality: N/A;  . POLYPECTOMY N/A  02/11/2019   Procedure: POLYPECTOMY;  Surgeon: [name], MD;  Location: [place];  Service: Endoscopy;  Laterality: N/A;  . SPINE SURGERY    . TONSILLECTOMY     Family History  Problem Relation Age of Onset  . Heart attack Mother   . Coronary artery disease Mother   . Heart disease Mother        Carotid Stenosis and BPG and Heart Disease before age 38  . Diabetes Mother   . Hypertension Mother   . Heart attack Father   . Heart disease Father        BPG and Heart Disease before age 87  . Hypertension Father   . Cancer Father 55       throat  . Stroke Father   . Colon cancer Neg Hx   . Colon  polyps Neg Hx   . Esophageal cancer Neg Hx   . Rectal cancer Neg Hx   . Stomach cancer Neg Hx    Social History   Socioeconomic History  . Marital status: Married    Spouse name: Not on file  . Number of children: 1  . Years of education: Not on file  . Highest education level: Bachelor's degree (e.g., BA, AB, BS)  Occupational History  . Not on file  Tobacco Use  . Smoking status: Former Smoker    Packs/day: 1.25    Years: 35.00    Pack years: 43.75    Types: Cigarettes    Quit date: 02/28/2005    Years since quitting: 14.5  . Smokeless tobacco: Current User    Types: Snuff  . Tobacco comment: occaisionally  Vaping Use  . Vaping Use: Never used  Substance and Sexual Activity  . Alcohol use: Yes    Alcohol/week: 8.0 - 10.0 standard drinks    Types: 8 - 10 Glasses of wine per week  . Drug use: No  . Sexual activity: Yes  Other Topics Concern  . Not on file  Social History Narrative  . Not on file   Social Determinants of Health   Financial Resource Strain: Low Risk   . Difficulty of Paying Living Expenses: Not hard at all  Food Insecurity: No Food Insecurity  . Worried About Charity fundraiser in the Last Year: Never true  . Ran Out of Food in the Last Year: Never true  Transportation Needs: No Transportation Needs  . Lack of Transportation (Medical): No  . Lack of Transportation (Non-Medical): No  Physical Activity: Sufficiently Active  . Days of Exercise per Week: 4 days  . Minutes of Exercise per Session: 60 min  Stress: No Stress Concern Present  . Feeling of Stress : Not at all  Social Connections: Moderately Integrated  . Frequency of Communication with Friends and Family: More than three times a week  . Frequency of Social Gatherings with Friends and Family: Three times a week  . Attends Religious Services: Never  . Active Member of Clubs or Organizations: Yes  . Attends Archivist Meetings: More than 4 times per year  . Marital Status:  Married    Tobacco Counseling Ready to quit: Not Answered Counseling given: Not Answered Comment: occaisionally   Clinical Intake:  Pre-visit preparation completed: Yes  Pain : No/denies pain     Nutritional Risks: None Diabetes: No  How often do you need to have someone help you when you read instructions, pamphlets, or other written materials from your doctor or pharmacy?: 1 -  Never    Interpreter Needed?: No  Information entered by :: [name] LPN   Activities of Daily Living In your present state of health, do you have any difficulty performing the following activities: 09/23/2019 02/11/2019  Hearing? N N  Comment declines hearing aids -  Vision? N N  Difficulty concentrating or making decisions? N N  Walking or climbing stairs? N N  Dressing or bathing? N N  Doing errands, shopping? N -  Preparing Food and eating ? N -  Using the Toilet? N -  In the past six months, have you accidently leaked urine? N -  Do you have problems with loss of bowel control? N -  Managing your Medications? N -  Managing your Finances? N -  Housekeeping or managing your Housekeeping? N -  Some recent data might be hidden    Patient Care Team: Glean Hess, MD as PCP - General (Internal Medicine) End, Harrell Gave, MD as PCP - Cardiology (Cardiology) Ladene Artist, MD as Consulting Physician (Gastroenterology) Chari Manning, MD as Attending Physician (Orthopedic Surgery) Eulogio Bear, MD as Consulting Physician (Ophthalmology) Lucky Cowboy Erskine Squibb, MD as Referring Physician (Vascular Surgery) End, Harrell Gave, MD as Consulting Physician (Cardiology) Ilean China, RN as Registered Nurse [place], Ronald Garnet, RN (Inactive) as [place] any recent [place] you may have received from other than [facility] providers in the past year (date may be approximate).     Assessment:   This is a routine wellness examination for  [place].  Hearing/Vision screen  Hearing Screening   125Hz  250Hz  500Hz  1000Hz  2000Hz  3000Hz  4000Hz  6000Hz  8000Hz   Right ear:           Left ear:           Comments: Pt denies hearing difficulty   Vision Screening Comments: [facility] screenings with [facility]  Dietary issues and exercise activities discussed: Current Exercise Habits: Home exercise routine, Type of exercise: Other - see comments (golf, yard work), Time (Minutes): 60, Frequency (Times/Week): 4, Weekly Exercise (Minutes/Week): 240, Intensity: Moderate, Exercise limited by: None identified  Goals    . DIET - INCREASE WATER INTAKE     Recommend drinking 6-8 glasses of water per day    . LDL CALC < 70     Given baseline LDL b/t 160-200 mg/dL, would like to see LDL at least 80 mg/dL, but prefer < 70 mg/dL      Depression Screen PHQ 2/9 Scores 09/23/2019 08/26/2019 05/21/2019 09/12/2018 09/06/2017 05/01/2017 01/06/2017  PHQ - 2 Score 0 0 0 0 0 0 0  PHQ- 9 Score - 0 0 - 0 1 0    Fall Risk Fall Risk  09/23/2019 08/26/2019 05/21/2019 09/12/2018 09/06/2017  Falls in the past year? 0 0 0 0 No  Number falls in past yr: 0 0 0 0 -  Injury with Fall? 0 0 0 0 -  Risk for fall due to : No Fall Risks No Fall Risks No Fall Risks - -  Follow up Falls prevention discussed Falls evaluation completed Falls evaluation completed Falls prevention discussed -    Any stairs in or around the home? Yes  If so, are there any without handrails? Yes  Home free of loose throw rugs in walkways, pet beds, electrical cords, etc? Yes  Adequate lighting in your home to reduce risk of falls? Yes   ASSISTIVE DEVICES UTILIZED TO PREVENT FALLS:  Life alert? No  Use  of a cane, walker or w/c? No  Grab bars in the bathroom? Yes  Shower chair or bench in shower? Yes Elevated toilet seat or a handicapped toilet? Yes   TIMED UP AND GO:  Was the test performed? No . Telephonic visit.   Cognitive Function: pt declined 6CIT for 2021 AWV; states no memory  issues     6CIT Screen 09/12/2018 09/06/2017 07/13/2016  What Year? 0 points 0 points 0 points  What month? 0 points 0 points 0 points  What time? 0 points 0 points -  Count back from 20 0 points 0 points -  Months in reverse 0 points 0 points -  Repeat phrase 0 points 4 points -  Total Score 0 4 -    Immunizations Immunization History  Administered Date(s) Administered  . Fluad Quad(high Dose 65+) 11/23/2018  . Influenza, High Dose Seasonal PF 12/23/2016, 12/12/2017  . Influenza-Unspecified 12/15/2015, 12/23/2016  . PFIZER SARS-COV-2 Vaccination 04/09/2019, 04/30/2019  . Pneumococcal Conjugate-13 06/15/2015  . Pneumococcal Polysaccharide-23 01/06/2017  . Tdap 04/18/2012    TDAP status: Up to date   Flu Vaccine status: Up to date   Pneumococcal vaccine status: Up to date   Covid-19 vaccine status: Completed vaccines  Qualifies for Shingles Vaccine? Yes   Zostavax completed No   Shingrix Completed?: No.    Education has been provided regarding the importance of this vaccine. Patient has been advised to call insurance company to determine out of pocket expense if they have not yet received this vaccine. Advised may also receive vaccine at local pharmacy or Health Dept. Verbalized acceptance and understanding.  Screening Tests Health Maintenance  Topic Date Due  . INFLUENZA VACCINE  09/29/2019  . TETANUS/TDAP  04/18/2022  . COLONOSCOPY  09/13/2023  . COVID-19 Vaccine  Completed  . Hepatitis C Screening  Completed  . PNA vac Low Risk Adult  Completed    Health Maintenance  There are no preventive care reminders to display for this patient.  Colorectal cancer screening: Completed 09/13/18. Repeat every 5 years  Lung Cancer Screening: (Low Dose CT Chest recommended if Age 67-80 years, 30 pack-year currently smoking OR have quit w/in 15years.) does qualify. Completed 12/11/18. Will be contacted for annual follow up.   Additional Screening:  Hepatitis C Screening: does  qualify; Completed 05/21/19  Vision Screening: Recommended annual ophthalmology exams for early detection of glaucoma and other disorders of the eye. Is the patient up to date with their annual eye exam?  Yes  Who is the provider or what is the name of the office in which the patient attends annual eye exams? [place] Screening: Recommended annual dental exams for proper oral hygiene  Community Resource Referral / Chronic Care Management: CRR required this visit?  No   CCM required this visit?  No      Plan:     I have personally reviewed and noted the following in the patient's chart:   . Medical and social history . Use of alcohol, tobacco or illicit drugs  . Current medications and supplements . Functional ability and status . Nutritional status . Physical activity . Advanced directives . List of other physicians . Hospitalizations, surgeries, and ER visits in previous 12 months . Vitals . Screenings to include cognitive, depression, and falls . Referrals and appointments  In addition, I have reviewed and discussed with patient certain preventive protocols, quality metrics, and best practice recommendations. A written personalized care plan for preventive services as well  as general preventive health recommendations were provided to patient.     [name], LPN   [id number]   Nurse Notes: pt doing well and appreciative of visit today

## 2019-10-08 ENCOUNTER — Other Ambulatory Visit: Payer: Self-pay | Admitting: Internal Medicine

## 2019-10-08 NOTE — Telephone Encounter (Signed)
LVM for patient to schedule f/u

## 2019-10-08 NOTE — Telephone Encounter (Signed)
Please schedule 6 month F/U with Dr. Saunders Revel or APP. Thank you!

## 2019-10-09 ENCOUNTER — Telehealth: Payer: Self-pay

## 2019-10-09 NOTE — Telephone Encounter (Signed)
Per fax via Marth[facility] Schiller Sureclick 140mg /mL PA request has been approved from 10/09/2019 through 12/08/2019. PA Case [id number]

## 2019-10-09 NOTE — Telephone Encounter (Signed)
PA initiated in [email or web address] for Repatha 140mg /mL. KEY: [id number] Waiting for [employer] Medicare NCPDP to return a determination.

## 2019-10-14 ENCOUNTER — Other Ambulatory Visit: Payer: Self-pay | Admitting: Internal Medicine

## 2019-10-14 DIAGNOSIS — I10 Essential (primary) hypertension: Secondary | ICD-10-CM

## 2019-10-21 ENCOUNTER — Other Ambulatory Visit: Payer: Self-pay

## 2019-10-21 ENCOUNTER — Telehealth: Payer: Self-pay | Admitting: Physician Assistant

## 2019-10-21 ENCOUNTER — Ambulatory Visit: Payer: PPO | Admitting: Physician Assistant

## 2019-10-21 ENCOUNTER — Emergency Department
Admission: EM | Admit: 2019-10-21 | Discharge: 2019-10-22 | Disposition: A | Discharge status: Home / Self Care | Payer: PPO | Attending: Emergency Medicine | Admitting: Emergency Medicine

## 2019-10-21 ENCOUNTER — Encounter: Payer: Self-pay | Admitting: Physician Assistant

## 2019-10-21 VITALS — BP 130/75 | HR 53 | Ht 66.0 in | Wt 179.2 lb

## 2019-10-21 DIAGNOSIS — I2510 Atherosclerotic heart disease of native coronary artery without angina pectoris: Secondary | ICD-10-CM

## 2019-10-21 DIAGNOSIS — I5022 Chronic systolic (congestive) heart failure: Secondary | ICD-10-CM | POA: Diagnosis not present

## 2019-10-21 DIAGNOSIS — I428 Other cardiomyopathies: Secondary | ICD-10-CM

## 2019-10-21 DIAGNOSIS — Z5321 Procedure and treatment not carried out due to patient leaving prior to being seen by health care provider: Secondary | ICD-10-CM | POA: Insufficient documentation

## 2019-10-21 DIAGNOSIS — E785 Hyperlipidemia, unspecified: Secondary | ICD-10-CM | POA: Diagnosis not present

## 2019-10-21 DIAGNOSIS — I10 Essential (primary) hypertension: Secondary | ICD-10-CM

## 2019-10-21 DIAGNOSIS — Z79899 Other long term (current) drug therapy: Secondary | ICD-10-CM

## 2019-10-21 DIAGNOSIS — Z008 Encounter for other general examination: Secondary | ICD-10-CM | POA: Diagnosis present

## 2019-10-21 DIAGNOSIS — R0602 Shortness of breath: Secondary | ICD-10-CM

## 2019-10-21 DIAGNOSIS — Z789 Other specified health status: Secondary | ICD-10-CM

## 2019-10-21 DIAGNOSIS — I739 Peripheral vascular disease, unspecified: Secondary | ICD-10-CM

## 2019-10-21 MED ORDER — FUROSEMIDE 20 MG PO TABS: ORAL_TABLET | ORAL | 1 refills | Status: DC

## 2019-10-21 NOTE — H&P (View-Only) (Signed)
Office Visit    Patient Name: [name] Date of Encounter: 10/21/2019  Primary Care Provider:  Glean Hess, MD Primary Cardiologist:  Nelva Bush, MD  Chief Complaint    Chief Complaint  Patient presents with  . 6 month follow up     69 yo male with history of CAD with NSTEMI 07/2015 s/p PCI to LCx, known LBBB, NICM with EF 40-45% on 01/2018 echo, PAD s/p mechanical thrombectomy and catheter-directed thrombolysis to left SFA 05/2018, carotid artery stenosis, HTN, HLD, hiatal hernia, previous tobacco use, and PUD, and here for 6 month follow-up.  Past Medical History    Past Medical History:  Diagnosis Date  . Abnormal nuclear cardiac imaging test 08/08/2015  . Arthritis    fingers  . Carotid artery occlusion   . Coronary atherosclerosis of native coronary artery 01/29/2013   stent  . Duodenal erosion   . Encounter for screening for lung cancer 07/13/2016  . Esophageal stenosis    esophageal dilation  . GERD (gastroesophageal reflux disease)   . H. pylori infection   . Heart attack [facility]) Oct. 2009   Mild  . Hiatal hernia   . Hyperlipidemia   . Hypertension   . 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 Cardiac catheterization-nonobstructive 60% RCA disease-subsequent nuclear stress test-9 minutes, low risk, mild inferior wall hypokinesis   . Pain in limb 12/19/2017  . Peripheral vascular disease ([place])   . Unstable angina ([place]) 11/25/2017   Past Surgical History:  Procedure Laterality Date  . APPENDECTOMY    . BACK SURGERY    . CARDIAC CATHETERIZATION N/A 08/07/2015   Procedure: Left Heart Cath and Coronary Angiography;  Surgeon: [name], MD;  Location: [place] [facility];  Service: Cardiovascular;  Laterality: N/A;  . CARDIAC CATHETERIZATION N/A 08/07/2015   Procedure: Coronary Stent Intervention;  Surgeon: [name], MD;  Location: [place] [facility];  Service: Cardiovascular;  Laterality: N/A;    . CARDIAC CATHETERIZATION N/A 08/07/2015   Procedure: Coronary Stent Intervention;  Surgeon: [name], MD;  Location: [place] [facility];  Service: Cardiovascular;  Laterality: N/A;  . CAROTID ENDARTERECTOMY  01/05/2006   Right  CEA with DPA  . CATARACT EXTRACTION W/ INTRAOCULAR LENS IMPLANT Left 12/04/2017  . CATARACT EXTRACTION W/PHACO Left 12/04/2017   Procedure: CATARACT EXTRACTION PHACO AND INTRAOCULAR LENS PLACEMENT (Ali Molina) LEFT;  Surgeon: Eulogio Bear, MD;  Location: [place];  Service: Ophthalmology;  Laterality: Left;  . CATARACT EXTRACTION W/PHACO Right 02/06/2018   Procedure: CATARACT EXTRACTION PHACO AND INTRAOCULAR LENS PLACEMENT (IOC)RIGHT;  Surgeon: Eulogio Bear, MD;  Location: [place];  Service: Ophthalmology;  Laterality: Right;  . COLONOSCOPY  05/20/2008  . COLONOSCOPY WITH PROPOFOL N/A 09/13/2018   Procedure: COLONOSCOPY WITH BIOPSY;  Surgeon: [name], MD;  Location: [place];  Service: Endoscopy;  Laterality: N/A;  . CORONARY STENT PLACEMENT  08/07/2015   MID CIRCUMFLEX  . ESOPHAGOGASTRODUODENOSCOPY (EGD) WITH PROPOFOL N/A 02/11/2019   Procedure: ESOPHAGOGASTRODUODENOSCOPY (EGD) WITH BIOPSY and  Dilation;  Surgeon: [name], MD;  Location: [place];  Service: Endoscopy;  Laterality: N/A;  . HIP SURGERY Left 10/2016   left hip tendon repair  . LEFT HEART CATH AND CORONARY ANGIOGRAPHY N/A 11/27/2017   Procedure: LEFT HEART CATH AND CORONARY ANGIOGRAPHY;  Surgeon: Wellington Hampshire, MD;  Location: [place] [facility];  Service: Cardiovascular;  Laterality: N/A;  .  LOWER EXTREMITY ANGIOGRAPHY Left 02/12/2018   Procedure: LOWER EXTREMITY ANGIOGRAPHY;  Surgeon: Algernon Huxley, MD;  Location: [place] [facility];  Service: Cardiovascular;  Laterality: Left;  . LOWER EXTREMITY ANGIOGRAPHY Left 03/07/2018   Procedure: LOWER EXTREMITY ANGIOGRAPHY;  Surgeon: Algernon Huxley, MD;  Location: [place] [facility];  Service:  Cardiovascular;  Laterality: Left;  . LOWER EXTREMITY ANGIOGRAPHY Left 06/04/2018   Procedure: LOWER EXTREMITY ANGIOGRAPHY;  Surgeon: Algernon Huxley, MD;  Location: [place] [facility];  Service: Cardiovascular;  Laterality: Left;  . POLYPECTOMY N/A 09/13/2018   Procedure: POLYPECTOMY;  Surgeon: [name], MD;  Location: [place];  Service: Endoscopy;  Laterality: N/A;  . POLYPECTOMY N/A 02/11/2019   Procedure: POLYPECTOMY;  Surgeon: [name], MD;  Location: [place];  Service: Endoscopy;  Laterality: N/A;  . SPINE SURGERY    . TONSILLECTOMY      Allergies  Allergies  Allergen Reactions  . Brilinta [Ticagrelor]     Shortness of breath  . Zetia [Ezetimibe] Other (See Comments)    Muscle aches  . Statins Other (See Comments)    Failed Crestor 5 mg twice weekly, Crestor 20 mg daily, Pravastatin 40 mg qd, Lipitor, Zocor - muscle aches    History of Present Illness    69 yo male with PMH as above and history significant for CAD, NICM, and PAD.   He underwent LHC 10/2017 for chest pain that revealed a widely patent LCx stent without significant restenosis and otherwise mild to moderate CAD. He had mild to moderately reduced LVSF with EF 40-45% and mildly elevated LVEDP at 57mmg. Given his CP did not seem to be cardiac, recommendation was for continued medication management.   Echo at that time showed EF 45-50%. Repeat limited echo 01/2018 showed slightly worsened pump function at 40-45%. He underwent mechanical thrombectomy and cathter thrombolysis to the L SFA in early 05/2018 and reported minimal leg pain at that time.   When last seen by virtual visit by his primary cardiologist, he remained enrolled in the CLEAR study for management of his HLD. He was also enrolled in a PCSK9i trial, before the CLEAR trial, with marked reduction in his LDL without side effects. It was noted he may need to consider switching to a PCSK9i soon.   When last seen in office 03/06/2019,  he did note he was "not breathing as freely;" however, he did not feel certain that it would necessarily qualify as shortness of breath.  He reported that it was only transient and on rare occasions at rest.  He was struggling to keep his weight off, especially given he did not plan to go to the gym during the pandemic. He continued to remain active at home as tolerated by his chronic pain (L hip, R foot).  He was still enrolled in the CLEAR study for management of his hyperlipidemia with most recent labs as below and LDL below goal 70.   On 07/16/2019, he called the office due to a change from his Praluent to [place].  Today, he returns to clinic and notes that he is still struggling with not breathing as freely at rest.  He also notes new DOE.  Specifically, he is reporting shortness of breath going up hills and when playing golf.  He continues to play golf 4 to 5 days a week and work in the yard daily for physical activity. He denies chest pain, palpitations,, pnd, orthopnea, n, v, dizziness, syncope, edema, or early satiety.  He is not checking his blood pressure at home.  His PCP provided him with an inhaler for his DOE that did not improve his breathing; however, he wonders if it was working correctly with plan to touch base with pharmacy.  We discussed his change from Praluent to [place].  He continues to struggle with keeping the weight off.  He reports medication compliance.  Home Medications    Prior to Admission medications   Medication Sig Start Date End Date Taking? Authorizing Provider  amLODipine (NORVASC) 5 MG tablet TAKE 1 TABLET BY MOUTH EVERY DAY 10/14/19  Yes End, Harrell Gave, MD  aspirin EC 81 MG tablet Take 1 tablet (81 mg total) by mouth daily. 06/05/18  Yes Stegmayer, Janalyn Harder, PA-C  clopidogrel (PLAVIX) 75 MG tablet TAKE ONE TABLET BY MOUTH DAILY. 05/13/19  Yes Kris Hartmann, NP  cyclobenzaprine (FLEXERIL) 10 MG tablet Take 1 tablet (10 mg total) by mouth 3 (three) times daily  as needed for muscle spasms. 06/14/18  Yes Glean Hess, MD  Evolocumab (REPATHA SURECLICK) 010 MG/ML SOAJ Inject 1 pen into the skin every 14 (fourteen) days. 10/08/19  Yes End, Harrell Gave, MD  lisinopril (ZESTRIL) 20 MG tablet TAKE 1 TABLET BY MOUTH EVERY DAY 10/14/19  Yes End, Harrell Gave, MD  metoprolol succinate (TOPROL-XL) 25 MG 24 hr tablet TAKE 1 TABLET BY MOUTH EVERY DAY 04/30/19  Yes End, Harrell Gave, MD  nitroGLYCERIN (NITROSTAT) 0.4 MG SL tablet Place 1 tablet (0.4 mg total) under the tongue every 5 (five) minutes as needed for chest pain. 08/08/15  Yes Kilroy, Luke K, PA-C  pantoprazole (PROTONIX) 40 MG tablet Take 1 tablet (40 mg total) by mouth daily. 09/13/19  Yes [name], MD  meloxicam (MOBIC) 7.5 MG tablet Take 1 tablet (7.5 mg total) by mouth daily. Patient taking differently: Take 7.5 mg by mouth as needed.  05/17/18   Glean Hess, MD    Review of Systems    He denies chest pain, palpitations, pnd, orthopnea, n, v, dizziness, syncope, edema, weight gain, or early satiety.  He reports shortness of breath at rest and with exertion.   All other systems reviewed and are otherwise negative except as noted above.  Physical Exam    VS:  BP 130/75   Pulse (!) 53   Ht 5\' 6"  (1.676 m)   Wt 179 lb 4 oz (81.3 kg)   BMI 28.93 kg/m  , BMI Body mass index is 28.93 kg/m. GEN: Well nourished, well developed, in no acute distress. HEENT: normal. Neck: Supple, no JVD, carotid bruits, or masses. Cardiac: Bradycardic but regular, no murmurs, rubs, or gallops. No clubbing, cyanosis, edema.  Radials/DP/PT 2+ and equal bilaterally.  Respiratory:  Respirations regular and unlabored, right-sided crackles GI: Soft, nontender, nondistended, BS + x 4. MS: no deformity or atrophy. Skin: warm and dry, no rash. Neuro:  Strength and sensation are intact. Psych: Normal affect.  Accessory Clinical Findings    ECG personally reviewed by me today -SB, 53 bpm, known left bundle branch  block, new T wave inversion noted in V1 to V6, I and aVL  VITALS Reviewed today   Temp Readings from Last 3 Encounters:  05/21/19 97.9 F (36.6 C) (Temporal)  02/11/19 97.7 F (36.5 C)  01/23/19 (!) 97.4 F (36.3 C) (Temporal)   BP Readings from Last 3 Encounters:  10/21/19 130/75  08/26/19 102/64  05/21/19 122/72   Pulse Readings from Last 3 Encounters:  10/21/19 (!) 53  08/26/19 74  05/21/19  62    Wt Readings from Last 3 Encounters:  10/21/19 179 lb 4 oz (81.3 kg)  08/26/19 181 lb (82.1 kg)  05/21/19 176 lb (79.8 kg)     LABS  reviewed today   Lab Results  Component Value Date   WBC 3.7 05/21/2019   HGB 14.8 05/21/2019   HCT 41.5 05/21/2019   MCV 89 05/21/2019   PLT 200 05/21/2019   Lab Results  Component Value Date   CREATININE 1.00 05/21/2019   BUN 24 05/21/2019   NA 138 05/21/2019   K 4.6 05/21/2019   CL 101 05/21/2019   CO2 22 05/21/2019   Lab Results  Component Value Date   ALT 29 05/21/2019   AST 24 05/21/2019   ALKPHOS 82 05/21/2019   BILITOT 0.5 05/21/2019   Lab Results  Component Value Date   CHOL 144 12/10/2018   HDL 59 12/10/2018   LDLCALC 68 12/10/2018   TRIG 88 12/10/2018   CHOLHDL 2.4 12/10/2018    Lab Results  Component Value Date   HGBA1C 5.6 09/06/2017   Lab Results  Component Value Date   TSH 1.420 09/06/2017     STUDIES/PROCEDURES reviewed today    Echo 02/27/2018 - Limited study for evaluation of left ventricular function. - Left ventricle: The cavity size was mildly dilated. Wall thickness was increased in a pattern of mild LVH. Systolic function was mildly to moderately reduced. The estimated ejection fraction was in the range of 40% to 45%. There is hypokinesis of the anteroseptal, anterior, and apical myocardium. Doppler parameters are consistent with abnormal left ventricular relaxation (grade 1 diastolic dysfunction).  Echo 11/25/2017 - Left ventricle: The cavity size was normal. Wall  thickness was  increased in a pattern of mild LVH. Systolic function was mildly  reduced. The estimated ejection fraction was in the range of 45%  to 50%. Regional wall motion abnormalities cannot be excluded.  Doppler parameters are consistent with abnormal left ventricular  relaxation (grade 1 diastolic dysfunction).  - Ventricular septum: Septal motion showed abnormal function. These  changes are consistent with a left bundle branch block.  - Aortic valve: There was trivial regurgitation.  - Mitral valve: There was mild regurgitation.  - Left atrium: The atrium was mildly dilated.  - Right ventricle: The cavity size was normal. Systolic function  was normal.   LHC 11/27/2017  Prox RCA to Mid RCA lesion is 40% stenosed.  Prox LAD lesion is 30% stenosed.  Previously placed Mid Cx stent (unknown type) is widely patent.  Ost 2nd Mrg lesion is 30% stenosed.  Dist Cx lesion is 40% stenosed.  There is mild to moderate left ventricular systolic dysfunction.  LV end diastolic pressure is mildly elevated. 1. Widely patent left circumflex stent with no significant restenosis. Otherwise mild to moderate nonobstructive coronary artery disease. 2. Mild to moderately reduced LV systolic function with an EF of 40 to 45%. 3. Mildly elevated left ventricular end-diastolic pressure at 13 mmHg. Recommendations: The patient's chest pain does not seem to be cardiac. He does have cardiomyopathy which seems to be nonischemic. Continue treatment with a beta-blocker and ACE inhibitor. Uptitrate as tolerated.   Assessment & Plan    Coronary artery disease without angina --Reports dyspnea, which is new when compared with his previous office visit.   September 2020 catheterization showed patent left circumflex stent to moderate, nonobstructive CAD. He does not recall if he had similar symptoms of DOE before his last catheterization but denies improvement in sx  with inhaler. Given  his DOE reported and EKG changes today, will order [facility] for further ischemic evaluation. At RTC, consider obtaining a repeat echo to recheck EF and reassess pump function, valves / structure, and pressures as these could also be a possible etiology of this SOB/DOE. Given EKG changes, preference is to prioritize Lexiscan Myoview and reassess echo at RTC.  Plan for 3 days lasix 20mg  as below and given R sided crackles with plan for possible daily Lasix pending response to 3 days. Will recheck LDL. Continue current medical therapy and secondary prevention with current medications.   Chronic systolic heart failure secondary to nonischemic cardiomyopathy --Right-sided crackles appreciated on exam/ BP is slightly elevated compared with that of previous. Weight down from previous. New DOE with ongoing SOB. Given EKG changes, plan for [facility] and likely repeat echo at RTC pending [place] results. LVEF 40 to 45%. With ongoing DOE, will need to reassess if any changes to EF/valves/structure that could also be contributing to his dyspnea. 3 days of Lasix 20 mg daily with follow-up BMET in 1 week and pending this possible daily lasix.  He is aware of this plan.  Continue to monitor weight, BP, volume status, and breathing at home. Continue lisinopril and metoprolol.  Peripheral arterial disease --S/p bilateral lower extremity interventions by Dr. Lucky Cowboy.  No significant / worsening pain in lower extremities or edema on exam.  Defer management of DAPT to vascular surgery.  No S/SX of bleeding on DAPT.  Hypertension --Blood pressure slightly up from previous.   Plan for 3 days of Lasix 20 mg as above.  Continue current medical management with metoprolol and lisinopril.  HLD with statin intolerance 2/2 myalgia --As above, patient has a history of intolerance to statins/myalgias on statins.  He is enrolled in the CLEAR research study with Praluent injections every 2 weeks and significant  improvement in his lipids.  Most recent LDL at goal of less than 70 and most recent LDLc 68.  Continue ongoing lipid management.  We will look into the reason behind switching from Praluent to [place].  Medication changes: 3 days lasix 20 mg qd for now (possible daily lasix pending repeat BMET, repeat symptom assessment). Labs ordered: BMET in 1 week Studies / Imaging ordered: Lexiscan Myoview Future considerations: Echo, ? Daily lasix Disposition: RTC after his Lexiscan or in 1 month    Arvil Chaco, Hershal Coria 10/21/2019

## 2019-10-21 NOTE — Patient Instructions (Signed)
Medication Instructions:  - Your physician has recommended you make the following change in your medication:   1) Start lasix (furosemide) 20 mg- take 1 tablet once daily x 3 days, then take 1 tablet daily as directed  *If you need a refill on your cardiac medications before your next appointment, please call your pharmacy*   Lab Work: - Your physician recommends that you return for FASTING lab work: Thursday 10/24/19- BMP/ Lipid  - come to the [place] entrance at [facility] - 1st desk on the right to check in past the screening table - nothing to eat/ drink for 8 hours prior to your lab draw except for water or black coffee  If you have labs (blood work) drawn today and your tests are completely normal, you will receive your results only by:  [place] (if you have MyChart) OR  A paper copy in the mail If you have any lab test that is abnormal or we need to change your treatment, we will call you to review the results.   Testing/Procedures: - Your physician has requested that you have a lexiscan myoview.   [place]  Your caregiver has ordered a Stress Test with nuclear imaging. The purpose of this test is to evaluate the blood supply to your heart muscle. This procedure is referred to as a "Non-Invasive Stress Test." This is because other than having an IV started in your vein, nothing is inserted or "invades" your body. Cardiac stress tests are done to find areas of poor blood flow to the heart by determining the extent of coronary artery disease (CAD). Some patients exercise on a treadmill, which naturally increases the blood flow to your heart, while others who are  unable to walk on a treadmill due to physical limitations have a pharmacologic/chemical stress agent called Lexiscan . This medicine will mimic walking on a treadmill by temporarily increasing your coronary blood flow.   Please note: these test may take anywhere between 2-4 hours to complete  PLEASE REPORT TO  [facility] AT THE FIRST DESK WILL DIRECT YOU WHERE TO GO  Date of Procedure:_____________________________________  Arrival Time for Procedure:______________________________  Instructions regarding medication:   _x___:  Hold betablocker(s) night before procedure and morning of procedure (Metoprolol succinate)  _x___:  You may take all of your other regular medications the morning of your test   PLEASE NOTIFY THE OFFICE AT LEAST 24 HOURS IN ADVANCE IF YOU ARE UNABLE TO KEEP YOUR APPOINTMENT.  [phone] AND  PLEASE NOTIFY NUCLEAR MEDICINE AT [facility] AT LEAST 24 HOURS IN ADVANCE IF YOU ARE UNABLE TO KEEP YOUR APPOINTMENT. [phone]  How to prepare for your Myoview test:  1. Do not eat or drink after midnight 2. No caffeine for 24 hours prior to test 3. No smoking 24 hours prior to test. 4. Your medication may be taken with water.  If your doctor stopped a medication because of this test, do not take that medication. 5. Ladies, please do not wear dresses.  Skirts or pants are appropriate. Please wear a short sleeve shirt. 6. No perfume, cologne or lotion. 7. Wear comfortable walking shoes. No heels!   Follow-Up: At [facility], you and your health needs are our priority.  As part of our continuing mission to provide you with exceptional heart care, we have created designated Provider Care Teams.  These Care Teams include your primary Cardiologist (physician) and Advanced Practice Providers (APPs -  Physician Assistants and Nurse  Practitioners) who all work together to provide you with the care you need, when you need it.  We recommend signing up for the patient portal called "MyChart".  Sign up information is provided on this After Visit Summary.  MyChart is used to connect with patients for Virtual Visits (Telemedicine).  Patients are able to view lab/test results, encounter notes, upcoming appointments, etc.  Non-urgent messages can be sent to your  provider as well.   To learn more about what you can do with MyChart, go to [email or web address].    Your next appointment:   After stress test is completed   The format for your next appointment:   In Person  Provider:    You may see Nelva Bush, MD or one of the following Advanced Practice Providers on your designated Care Team:    Murray Hodgkins, NP  Christell Faith, PA-C  Marrianne Mood, PA-C    Other Instructions    Cardiac Nuclear Scan A cardiac nuclear scan is a test that measures blood flow to the heart when a person is resting and when he or she is exercising. The test looks for problems such as:  Not enough blood reaching a portion of the heart.  The heart muscle not working normally. You may need this test if:  You have heart disease.  You have had abnormal lab results.  You have had heart surgery or a balloon procedure to open up blocked arteries (angioplasty).  You have chest pain.  You have shortness of breath. In this test, a radioactive dye (tracer) is injected into your bloodstream. After the tracer has traveled to your heart, an imaging device is used to measure how much of the tracer is absorbed by or distributed to various areas of your heart. This procedure is usually done at a hospital and takes 2-4 hours. Tell a health care provider about:  Any allergies you have.  All medicines you are taking, including vitamins, herbs, eye drops, creams, and over-the-counter medicines.  Any problems you or family members have had with anesthetic medicines.  Any blood disorders you have.  Any surgeries you have had.  Any medical conditions you have.  Whether you are pregnant or may be pregnant. What are the risks? Generally, this is a safe procedure. However, problems may occur, including:  Serious chest pain and heart attack. This is only a risk if the stress portion of the test is done.  Rapid heartbeat.  Sensation of warmth in your  chest. This usually passes quickly.  Allergic reaction to the tracer. What happens before the procedure?  Ask your health care provider about changing or stopping your regular medicines. This is especially important if you are taking diabetes medicines or blood thinners.  Follow instructions from your health care provider about eating or drinking restrictions.  Remove your jewelry on the day of the procedure. What happens during the procedure?  An IV will be inserted into one of your veins.  Your health care provider will inject a small amount of radioactive tracer through the IV.  You will wait for 20-40 minutes while the tracer travels through your bloodstream.  Your heart activity will be monitored with an electrocardiogram (ECG).  You will lie down on an exam table.  Images of your heart will be taken for about 15-20 minutes.  You may also have a stress test. For this test, one of the following may be done: ? You will exercise on a treadmill or stationary bike. While you exercise,  your heart's activity will be monitored with an ECG, and your blood pressure will be checked. ? You will be given medicines that will increase blood flow to parts of your heart. This is done if you are unable to exercise.  When blood flow to your heart has peaked, a tracer will again be injected through the IV.  After 20-40 minutes, you will get back on the exam table and have more images taken of your heart.  Depending on the type of tracer used, scans may need to be repeated 3-4 hours later.  Your IV line will be removed when the procedure is over. The procedure may vary among health care providers and hospitals. What happens after the procedure?  Unless your health care provider tells you otherwise, you may return to your normal schedule, including diet, activities, and medicines.  Unless your health care provider tells you otherwise, you may increase your fluid intake. This will help to flush  the contrast dye from your body. Drink enough fluid to keep your urine pale yellow.  Ask your health care provider, or the department that is doing the test: ? When will my results be ready? ? How will I get my results? Summary  A cardiac nuclear scan measures the blood flow to the heart when a person is resting and when he or she is exercising.  Tell your health care provider if you are pregnant.  Before the procedure, ask your health care provider about changing or stopping your regular medicines. This is especially important if you are taking diabetes medicines or blood thinners.  After the procedure, unless your health care provider tells you otherwise, increase your fluid intake. This will help flush the contrast dye from your body.  After the procedure, unless your health care provider tells you otherwise, you may return to your normal schedule, including diet, activities, and medicines. This information is not intended to replace advice given to you by your health care provider. Make sure you discuss any questions you have with your health care provider. Document Revised: 07/31/2017 Document Reviewed: 07/31/2017 Elsevier Patient Education  [place].

## 2019-10-21 NOTE — Telephone Encounter (Signed)
Spoke with cardiology fellow and conveyed Dr. Tyrell Antonio recommendations based on reviewing his EKG.

## 2019-10-21 NOTE — Telephone Encounter (Signed)
Spoke with interventional team about his EKG. Recommendation is he present to the ED for further workup. Patient states he is not currently having symptoms and thus does not feel ED is warranted. Explained that his EKG shows he's at risk for sudden plaque rupture or cardiac event. Spoke with wife as well. Patient and wife will discuss and call back with their decision.   Paged the cardiology fellow.

## 2019-10-21 NOTE — Progress Notes (Signed)
Office Visit    Patient Name: [name] Date of Encounter: 10/21/2019  Primary Care Provider:  Glean Hess, MD Primary Cardiologist:  Nelva Bush, MD  Chief Complaint    Chief Complaint  Patient presents with   6 month follow up     69 yo male with history of CAD with NSTEMI 07/2015 s/p PCI to LCx, known LBBB, NICM with EF 40-45% on 01/2018 echo, PAD s/p mechanical thrombectomy and catheter-directed thrombolysis to left SFA 05/2018, carotid artery stenosis, HTN, HLD, hiatal hernia, previous tobacco use, and PUD, and here for 6 month follow-up.  Past Medical History    Past Medical History:  Diagnosis Date   Abnormal nuclear cardiac imaging test 08/08/2015   Arthritis    fingers   Carotid artery occlusion    Coronary atherosclerosis of native coronary artery 01/29/2013   stent   Duodenal erosion    Encounter for screening for lung cancer 07/13/2016   Esophageal stenosis    esophageal dilation   GERD (gastroesophageal reflux disease)    H. pylori infection    Heart attack ([place]) Oct. 2009   Mild   Hiatal hernia    Hyperlipidemia    Hypertension    Old myocardial infarction 11/29/2007   Mildly elevated troponin, isolated value in October 2009. Cardiac catheterization-nonobstructive 60% RCA disease-subsequent nuclear stress test-9 minutes, low risk, mild inferior wall hypokinesis    Pain in limb 12/19/2017   Peripheral vascular disease ([place])    Unstable angina ([place]) 11/25/2017   Past Surgical History:  Procedure Laterality Date   APPENDECTOMY     BACK SURGERY     CARDIAC CATHETERIZATION N/A 08/07/2015   Procedure: Left Heart Cath and Coronary Angiography;  Surgeon: [name], MD;  Location: [facility];  Service: Cardiovascular;  Laterality: N/A;   CARDIAC CATHETERIZATION N/A 08/07/2015   Procedure: Coronary Stent Intervention;  Surgeon: [name], MD;  Location: [facility];  Service: Cardiovascular;  Laterality: N/A;     CARDIAC CATHETERIZATION N/A 08/07/2015   Procedure: Coronary Stent Intervention;  Surgeon: [name], MD;  Location: [facility];  Service: Cardiovascular;  Laterality: N/A;   CAROTID ENDARTERECTOMY  01/05/2006   Right  CEA with DPA   CATARACT EXTRACTION W/ INTRAOCULAR LENS IMPLANT Left 12/04/2017   CATARACT EXTRACTION W/PHACO Left 12/04/2017   Procedure: CATARACT EXTRACTION PHACO AND INTRAOCULAR LENS PLACEMENT ([place]) LEFT;  Surgeon: Eulogio Bear, MD;  Location: [facility];  Service: Ophthalmology;  Laterality: Left;   CATARACT EXTRACTION W/PHACO Right 02/06/2018   Procedure: CATARACT EXTRACTION PHACO AND INTRAOCULAR LENS PLACEMENT ([facility])RIGHT;  Surgeon: Eulogio Bear, MD;  Location: [facility];  Service: Ophthalmology;  Laterality: Right;   COLONOSCOPY  05/20/2008   COLONOSCOPY WITH PROPOFOL N/A 09/13/2018   Procedure: COLONOSCOPY WITH BIOPSY;  Surgeon: [name], MD;  Location: [facility];  Service: Endoscopy;  Laterality: N/A;   CORONARY STENT PLACEMENT  08/07/2015   MID CIRCUMFLEX   ESOPHAGOGASTRODUODENOSCOPY (EGD) WITH PROPOFOL N/A 02/11/2019   Procedure: ESOPHAGOGASTRODUODENOSCOPY (EGD) WITH BIOPSY and  Dilation;  Surgeon: [name], MD;  Location: [place];  Service: Endoscopy;  Laterality: N/A;   HIP SURGERY Left 10/2016   left hip tendon repair   LEFT HEART CATH AND CORONARY ANGIOGRAPHY N/A 11/27/2017   Procedure: LEFT HEART CATH AND CORONARY ANGIOGRAPHY;  Surgeon: Wellington Hampshire, MD;  Location: [facility];  Service: Cardiovascular;  Laterality: N/A;  LOWER EXTREMITY ANGIOGRAPHY Left 02/12/2018   Procedure: LOWER EXTREMITY ANGIOGRAPHY;  Surgeon: Algernon Huxley, MD;  Location: [facility];  Service: Cardiovascular;  Laterality: Left;   LOWER EXTREMITY ANGIOGRAPHY Left 03/07/2018   Procedure: LOWER EXTREMITY ANGIOGRAPHY;  Surgeon: Algernon Huxley, MD;  Location: [facility];  Service:  Cardiovascular;  Laterality: Left;   LOWER EXTREMITY ANGIOGRAPHY Left 06/04/2018   Procedure: LOWER EXTREMITY ANGIOGRAPHY;  Surgeon: Algernon Huxley, MD;  Location: [facility];  Service: Cardiovascular;  Laterality: Left;   POLYPECTOMY N/A 09/13/2018   Procedure: POLYPECTOMY;  Surgeon: [name], MD;  Location: [facility];  Service: Endoscopy;  Laterality: N/A;   POLYPECTOMY N/A 02/11/2019   Procedure: POLYPECTOMY;  Surgeon: [name], MD;  Location: [facility];  Service: Endoscopy;  Laterality: N/A;   SPINE SURGERY     TONSILLECTOMY      Allergies  Allergies  Allergen Reactions   Brilinta [Ticagrelor]     Shortness of breath   Zetia [Ezetimibe] Other (See Comments)    Muscle aches   Statins Other (See Comments)    Failed Crestor 5 mg twice weekly, Crestor 20 mg daily, Pravastatin 40 mg qd, Lipitor, Zocor - muscle aches    History of Present Illness    69 yo male with PMH as above and history significant for CAD, NICM, and PAD.   He underwent LHC 10/2017 for chest pain that revealed a widely patent LCx stent without significant restenosis and otherwise mild to moderate CAD. He had mild to moderately reduced LVSF with EF 40-45% and mildly elevated LVEDP at 84mmg. Given his CP did not seem to be cardiac, recommendation was for continued medication management.   Echo at that time showed EF 45-50%. Repeat limited echo 01/2018 showed slightly worsened pump function at 40-45%. He underwent mechanical thrombectomy and cathter thrombolysis to the L SFA in early 05/2018 and reported minimal leg pain at that time.   When last seen by virtual visit by his primary cardiologist, he remained enrolled in the CLEAR study for management of his HLD. He was also enrolled in a PCSK9i trial, before the CLEAR trial, with marked reduction in his LDL without side effects. It was noted he may need to consider switching to a PCSK9i soon.   When last seen in office 03/06/2019,  he did note he was "not breathing as freely;" however, he did not feel certain that it would necessarily qualify as shortness of breath.  He reported that it was only transient and on rare occasions at rest.  He was struggling to keep his weight off, especially given he did not plan to go to the gym during the pandemic. He continued to remain active at home as tolerated by his chronic pain (L hip, R foot).  He was still enrolled in the CLEAR study for management of his hyperlipidemia with most recent labs as below and LDL below goal 70.   On 07/16/2019, he called the office due to a change from his Praluent to [place].  Today, he returns to clinic and notes that he is still struggling with not breathing as freely at rest.  He also notes new DOE.  Specifically, he is reporting shortness of breath going up hills and when playing golf.  He continues to play golf 4 to 5 days a week and work in the yard daily for physical activity. He denies chest pain, palpitations,, pnd, orthopnea, n, v, dizziness, syncope, edema, or early satiety.  He is not checking his blood pressure at home.  His PCP provided him with an inhaler for his DOE that did not improve his breathing; however, he wonders if it was working correctly with plan to touch base with pharmacy.  We discussed his change from Praluent to [place].  He continues to struggle with keeping the weight off.  He reports medication compliance.  Home Medications    Prior to Admission medications   Medication Sig Start Date End Date Taking? Authorizing Provider  amLODipine (NORVASC) 5 MG tablet TAKE 1 TABLET BY MOUTH EVERY DAY 10/14/19  Yes End, Harrell Gave, MD  aspirin EC 81 MG tablet Take 1 tablet (81 mg total) by mouth daily. 06/05/18  Yes Stegmayer, Janalyn Harder, PA-C  clopidogrel (PLAVIX) 75 MG tablet TAKE ONE TABLET BY MOUTH DAILY. 05/13/19  Yes Kris Hartmann, NP  cyclobenzaprine (FLEXERIL) 10 MG tablet Take 1 tablet (10 mg total) by mouth 3 (three) times daily  as needed for muscle spasms. 06/14/18  Yes Glean Hess, MD  Evolocumab (REPATHA SURECLICK) 683 MG/ML SOAJ Inject 1 pen into the skin every 14 (fourteen) days. 10/08/19  Yes End, Harrell Gave, MD  lisinopril (ZESTRIL) 20 MG tablet TAKE 1 TABLET BY MOUTH EVERY DAY 10/14/19  Yes End, Harrell Gave, MD  metoprolol succinate (TOPROL-XL) 25 MG 24 hr tablet TAKE 1 TABLET BY MOUTH EVERY DAY 04/30/19  Yes End, Harrell Gave, MD  nitroGLYCERIN (NITROSTAT) 0.4 MG SL tablet Place 1 tablet (0.4 mg total) under the tongue every 5 (five) minutes as needed for chest pain. 08/08/15  Yes Kilroy, Luke K, PA-C  pantoprazole (PROTONIX) 40 MG tablet Take 1 tablet (40 mg total) by mouth daily. 09/13/19  Yes [name], MD  meloxicam (MOBIC) 7.5 MG tablet Take 1 tablet (7.5 mg total) by mouth daily. Patient taking differently: Take 7.5 mg by mouth as needed.  05/17/18   Glean Hess, MD    Review of Systems    He denies chest pain, palpitations, pnd, orthopnea, n, v, dizziness, syncope, edema, weight gain, or early satiety.  He reports shortness of breath at rest and with exertion.   All other systems reviewed and are otherwise negative except as noted above.  Physical Exam    VS:  BP 130/75    Pulse (!) 53    Ht 5\' 6"  (1.676 m)    Wt 179 lb 4 oz (81.3 kg)    BMI 28.93 kg/m  , BMI Body mass index is 28.93 kg/m. GEN: Well nourished, well developed, in no acute distress. HEENT: normal. Neck: Supple, no JVD, carotid bruits, or masses. Cardiac: Bradycardic but regular, no murmurs, rubs, or gallops. No clubbing, cyanosis, edema.  Radials/DP/PT 2+ and equal bilaterally.  Respiratory:  Respirations regular and unlabored, right-sided crackles GI: Soft, nontender, nondistended, BS + x 4. MS: no deformity or atrophy. Skin: warm and dry, no rash. Neuro:  Strength and sensation are intact. Psych: Normal affect.  Accessory Clinical Findings    ECG personally reviewed by me today -SB, 53 bpm, known left bundle branch  block, new T wave inversion noted in V1 to V6, I and aVL  VITALS Reviewed today   Temp Readings from Last 3 Encounters:  05/21/19 97.9 F (36.6 C) (Temporal)  02/11/19 97.7 F (36.5 C)  01/23/19 (!) 97.4 F (36.3 C) (Temporal)   BP Readings from Last 3 Encounters:  10/21/19 130/75  08/26/19 102/64  05/21/19 122/72   Pulse Readings from Last 3 Encounters:  10/21/19 (!) 53  08/26/19 74  05/21/19 62    Wt Readings from Last 3 Encounters:  10/21/19 179 lb 4 oz (81.3 kg)  08/26/19 181 lb (82.1 kg)  05/21/19 176 lb (79.8 kg)     LABS  reviewed today   Lab Results  Component Value Date   WBC 3.7 05/21/2019   HGB 14.8 05/21/2019   HCT 41.5 05/21/2019   MCV 89 05/21/2019   PLT 200 05/21/2019   Lab Results  Component Value Date   CREATININE 1.00 05/21/2019   BUN 24 05/21/2019   NA 138 05/21/2019   K 4.6 05/21/2019   CL 101 05/21/2019   CO2 22 05/21/2019   Lab Results  Component Value Date   ALT 29 05/21/2019   AST 24 05/21/2019   ALKPHOS 82 05/21/2019   BILITOT 0.5 05/21/2019   Lab Results  Component Value Date   CHOL 144 12/10/2018   HDL 59 12/10/2018   LDLCALC 68 12/10/2018   TRIG 88 12/10/2018   CHOLHDL 2.4 12/10/2018    Lab Results  Component Value Date   HGBA1C 5.6 09/06/2017   Lab Results  Component Value Date   TSH 1.420 09/06/2017     STUDIES/PROCEDURES reviewed today    Echo 02/27/2018 - Limited study for evaluation of left ventricular function. - Left ventricle: The cavity size was mildly dilated. Wall thickness was increased in a pattern of mild LVH. Systolic function was mildly to moderately reduced. The estimated ejection fraction was in the range of 40% to 45%. There is hypokinesis of the anteroseptal, anterior, and apical myocardium. Doppler parameters are consistent with abnormal left ventricular relaxation (grade 1 diastolic dysfunction).  Echo 11/25/2017 - Left ventricle: The cavity size was normal. Wall  thickness was  increased in a pattern of mild LVH. Systolic function was mildly  reduced. The estimated ejection fraction was in the range of 45%  to 50%. Regional wall motion abnormalities cannot be excluded.  Doppler parameters are consistent with abnormal left ventricular  relaxation (grade 1 diastolic dysfunction).  - Ventricular septum: Septal motion showed abnormal function. These  changes are consistent with a left bundle branch block.  - Aortic valve: There was trivial regurgitation.  - Mitral valve: There was mild regurgitation.  - Left atrium: The atrium was mildly dilated.  - Right ventricle: The cavity size was normal. Systolic function  was normal.   LHC 11/27/2017  Prox RCA to Mid RCA lesion is 40% stenosed.  Prox LAD lesion is 30% stenosed.  Previously placed Mid Cx stent (unknown type) is widely patent.  Ost 2nd Mrg lesion is 30% stenosed.  Dist Cx lesion is 40% stenosed.  There is mild to moderate left ventricular systolic dysfunction.  LV end diastolic pressure is mildly elevated. 1. Widely patent left circumflex stent with no significant restenosis. Otherwise mild to moderate nonobstructive coronary artery disease. 2. Mild to moderately reduced LV systolic function with an EF of 40 to 45%. 3. Mildly elevated left ventricular end-diastolic pressure at 13 mmHg. Recommendations: The patient's chest pain does not seem to be cardiac. He does have cardiomyopathy which seems to be nonischemic. Continue treatment with a beta-blocker and ACE inhibitor. Uptitrate as tolerated.   Assessment & Plan    Coronary artery disease without angina --Reports dyspnea, which is new when compared with his previous office visit.   September 2020 catheterization showed patent left circumflex stent to moderate, nonobstructive CAD. He does not recall if he had similar symptoms of DOE before his last catheterization but  denies improvement in sx with inhaler. Given  his DOE reported and EKG changes today, will order [facility] for further ischemic evaluation. At RTC, consider obtaining a repeat echo to recheck EF and reassess pump function, valves / structure, and pressures as these could also be a possible etiology of this SOB/DOE. Given EKG changes, preference is to prioritize Lexiscan Myoview and reassess echo at RTC.  Plan for 3 days lasix 20mg  as below and given R sided crackles with plan for possible daily Lasix pending response to 3 days. Will recheck LDL. Continue current medical therapy and secondary prevention with current medications.   Chronic systolic heart failure secondary to nonischemic cardiomyopathy --Right-sided crackles appreciated on exam/ BP is slightly elevated compared with that of previous. Weight down from previous. New DOE with ongoing SOB. Given EKG changes, plan for [facility] and likely repeat echo at RTC pending [facility] results. LVEF 40 to 45%. With ongoing DOE, will need to reassess if any changes to EF/valves/structure that could also be contributing to his dyspnea. 3 days of Lasix 20 mg daily with follow-up BMET in 1 week and pending this possible daily lasix.  He is aware of this plan.  Continue to monitor weight, BP, volume status, and breathing at home. Continue lisinopril and metoprolol.  Peripheral arterial disease --S/p bilateral lower extremity interventions by Dr. Lucky Cowboy.  No significant / worsening pain in lower extremities or edema on exam.  Defer management of DAPT to vascular surgery.  No S/SX of bleeding on DAPT.  Hypertension --Blood pressure slightly up from previous.   Plan for 3 days of Lasix 20 mg as above.  Continue current medical management with metoprolol and lisinopril.  HLD with statin intolerance 2/2 myalgia --As above, patient has a history of intolerance to statins/myalgias on statins.  He is enrolled in the CLEAR research study with Praluent injections every 2 weeks and significant  improvement in his lipids.  Most recent LDL at goal of less than 70 and most recent LDLc 68.  Continue ongoing lipid management.  We will look into the reason behind switching from Praluent to [place].  Medication changes: 3 days lasix 20 mg qd for now (possible daily lasix pending repeat BMET, repeat symptom assessment). Labs ordered: BMET in 1 week Studies / Imaging ordered: Lexiscan Myoview Future considerations: Echo, ? Daily lasix Disposition: RTC after his Lexiscan or in 1 month    Arvil Chaco, Hershal Coria 10/21/2019

## 2019-10-22 ENCOUNTER — Telehealth: Payer: Self-pay | Admitting: *Deleted

## 2019-10-22 DIAGNOSIS — I2510 Atherosclerotic heart disease of native coronary artery without angina pectoris: Secondary | ICD-10-CM

## 2019-10-22 DIAGNOSIS — Z01810 Encounter for preprocedural cardiovascular examination: Secondary | ICD-10-CM

## 2019-10-22 NOTE — Telephone Encounter (Signed)
Patient agreeable to the plan of care. Lexiscan cancelled. Patient scheduled for Left heart cath on 10/29/19 with Dr End at [facility]. Message with instructions sent to patient via [place]. Patient also wrote down the instructions as listed below and verbalized understanding.  Message sent to precert and preadmit called to schedule COvid test.     You are scheduled for a Cardiac Catheterization on Tuesday, August 31 with Dr. Harrell Gave End.  1. Please arrive at the [facility] at 6:30 AM (This time is one hour before your procedure to ensure your preparation). Free valet parking service is available.   Special note: Every effort is made to have your procedure done on time. Please understand that emergencies sometimes delay scheduled procedures.  2. Diet: Do not eat solid foods after midnight.  The patient may have clear liquids until 5am upon the day of the procedure.  3. Labs: You will need to have blood drawn on Friday, August 27 at [facility], Go to 1st desk on your right to register.  LIPID, CBC, BMET Address: [address] [place], [place] [address]  Open: 8am - 5pm  Phone: [phone]. You do need to be fasting.  COVID PRE- TEST: You will need a COVID TEST prior to the procedure:  LOCATION: [address] Testing site.  DATE/TIME:  Friday, October 25, 2019 between 8 am and 1 pm.    4. Medication instructions in preparation for your procedure:   Contrast Allergy: No  Do not take the Furosemide the morning of the procedure.   On the morning of your procedure, take your Plavix/Clopidogrel and any morning medicines NOT listed above.  You may use sips of water.  5. Plan for one night stay--bring personal belongings. 6. Bring a current list of your medications and current insurance cards. 7. You MUST have a responsible person to drive you home. 8. Someone MUST be with you the first 24 hours after you arrive home or your discharge will be  delayed. 9. Please wear clothes that are easy to get on and off and wear slip-on shoes.  Thank you for allowing [place] to care for you!   -- [facility]

## 2019-10-22 NOTE — Addendum Note (Signed)
Addended by: Marrianne Mood D on: 10/22/2019 02:23 AM   Modules accepted: Level of Service

## 2019-10-22 NOTE — ED Notes (Signed)
Pt left after completion of EKG, before triage, stating he would f/u with PCP in morning

## 2019-10-22 NOTE — Telephone Encounter (Signed)
-----   Message from Arvil Chaco, PA-C sent at 10/22/2019 10:50 AM EDT ----- Regarding: Catheterization Hello,  Please (1) set this patient up for left heart cardiac catheterization with Dr. Saunders Revel next Tuesday. Please (2) cancel his Lexiscan Myoview.  Reason for cath:  Sent to ED due to EKG changes in clinic yesterday.  ED EKG normal Case reviewed with Dr. Saunders Revel today - he recommends cath over Lexi.  Let me know if questions. --JV

## 2019-10-22 NOTE — Telephone Encounter (Signed)
Had to rescheduled cath and testing as follows due to Dr End being out of town next week. Patient agreeable and verbalized understanding of the following: 8/26 or [facility] for fasting lab work. 9/3 - Covid Testing at the [address] between 8 am and 1 pm. 9/7 - Left Heart Catheterization, [facility] 8[facility].   Message to to patient via MyChart as well.

## 2019-10-23 ENCOUNTER — Other Ambulatory Visit: Payer: Self-pay | Admitting: Physician Assistant

## 2019-10-23 DIAGNOSIS — Z872 Personal history of diseases of the skin and subcutaneous tissue: Secondary | ICD-10-CM | POA: Diagnosis not present

## 2019-10-23 DIAGNOSIS — L578 Other skin changes due to chronic exposure to nonionizing radiation: Secondary | ICD-10-CM | POA: Diagnosis not present

## 2019-10-23 MED ORDER — SODIUM CHLORIDE 0.9% FLUSH
3.0000 mL | Freq: Two times a day (BID) | INTRAVENOUS | Status: DC
  Filled 2019-10-23: qty 3

## 2019-10-24 ENCOUNTER — Other Ambulatory Visit
Admission: RE | Admit: 2019-10-24 | Discharge: 2019-10-24 | Disposition: A | Discharge status: Home / Self Care | Payer: PPO | Attending: Physician Assistant | Admitting: Physician Assistant

## 2019-10-24 ENCOUNTER — Other Ambulatory Visit: Payer: Self-pay

## 2019-10-24 DIAGNOSIS — Z79899 Other long term (current) drug therapy: Secondary | ICD-10-CM | POA: Diagnosis not present

## 2019-10-24 DIAGNOSIS — E785 Hyperlipidemia, unspecified: Secondary | ICD-10-CM | POA: Insufficient documentation

## 2019-10-24 DIAGNOSIS — I2510 Atherosclerotic heart disease of native coronary artery without angina pectoris: Secondary | ICD-10-CM | POA: Insufficient documentation

## 2019-10-24 DIAGNOSIS — R0602 Shortness of breath: Secondary | ICD-10-CM | POA: Insufficient documentation

## 2019-10-24 DIAGNOSIS — Z01810 Encounter for preprocedural cardiovascular examination: Secondary | ICD-10-CM | POA: Insufficient documentation

## 2019-10-24 LAB — LIPID PANEL
Cholesterol: 154 mg/dL (ref 0–200)
HDL: 61 mg/dL (ref >40)
LDL Cholesterol: 69 mg/dL (ref 0–99)
Total CHOL/HDL Ratio: 2.5 RATIO
Triglycerides: 120 mg/dL (ref <150)
VLDL: 24 mg/dL (ref 0–40)

## 2019-10-24 LAB — CBC WITH DIFFERENTIAL/PLATELET
Abs Immature Granulocytes: 0.05 10*3/uL (ref 0.00–0.07)
Basophils Absolute: 0.1 10*3/uL (ref 0.0–0.1)
Basophils Relative: 1 %
Eosinophils Absolute: 0.2 10*3/uL (ref 0.0–0.5)
Eosinophils Relative: 4 %
HCT: 45.6 % (ref 39.0–52.0)
Hemoglobin: 15.9 g/dL (ref 13.0–17.0)
Immature Granulocytes: 1 %
Lymphocytes Relative: 24 %
Lymphs Abs: 1.5 10*3/uL (ref 0.7–4.0)
MCH: 32.1 pg (ref 26.0–34.0)
MCHC: 34.9 g/dL (ref 30.0–36.0)
MCV: 91.9 fL (ref 80.0–100.0)
Monocytes Absolute: 0.7 10*3/uL (ref 0.1–1.0)
Monocytes Relative: 11 %
Neutro Abs: 3.7 10*3/uL (ref 1.7–7.7)
Neutrophils Relative %: 59 %
Platelets: 184 10*3/uL (ref 150–400)
RBC: 4.96 MIL/uL (ref 4.22–5.81)
RDW: 12.8 % (ref 11.5–15.5)
WBC: 6.2 10*3/uL (ref 4.0–10.5)
nRBC: 0 % (ref 0.0–0.2)

## 2019-10-24 LAB — BASIC METABOLIC PANEL
Anion gap: 10 (ref 5–15)
BUN: 24 mg/dL — ABNORMAL HIGH (ref 8–23)
CO2: 27 mmol/L (ref 22–32)
Calcium: 9.2 mg/dL (ref 8.9–10.3)
Chloride: 101 mmol/L (ref 98–111)
Creatinine, Ser: 1.03 mg/dL (ref 0.61–1.24)
GFR calc Af Amer: >60 mL/min (ref >60)
GFR calc non Af Amer: >60 mL/min (ref >60)
Glucose, Bld: 111 mg/dL — ABNORMAL HIGH (ref 70–99)
Potassium: 4.8 mmol/L (ref 3.5–5.1)
Sodium: 138 mmol/L (ref 135–145)

## 2019-10-25 ENCOUNTER — Other Ambulatory Visit: Payer: PPO

## 2019-10-29 ENCOUNTER — Other Ambulatory Visit: Payer: Self-pay | Admitting: Physician Assistant

## 2019-10-30 ENCOUNTER — Other Ambulatory Visit: Payer: PPO

## 2019-11-01 ENCOUNTER — Other Ambulatory Visit
Admission: RE | Admit: 2019-11-01 | Discharge: 2019-11-01 | Disposition: A | Discharge status: Home / Self Care | Payer: PPO | Source: Ambulatory Visit | Attending: Internal Medicine | Admitting: Internal Medicine

## 2019-11-01 ENCOUNTER — Other Ambulatory Visit: Payer: Self-pay

## 2019-11-01 DIAGNOSIS — Z20822 Contact with and (suspected) exposure to covid-19: Secondary | ICD-10-CM | POA: Diagnosis not present

## 2019-11-01 DIAGNOSIS — Z01812 Encounter for preprocedural laboratory examination: Secondary | ICD-10-CM | POA: Diagnosis not present

## 2019-11-01 LAB — SARS CORONAVIRUS 2 (TAT 6-24 HRS): SARS Coronavirus 2: NEGATIVE

## 2019-11-05 ENCOUNTER — Encounter: Payer: Self-pay | Admitting: Cardiology

## 2019-11-05 ENCOUNTER — Inpatient Hospital Stay
Admission: AD | Admit: 2019-11-05 | Discharge: 2019-11-05 | DRG: 287 | Disposition: A | Discharge status: Home / Self Care | Payer: PPO | Attending: Internal Medicine | Admitting: Internal Medicine

## 2019-11-05 ENCOUNTER — Encounter: Payer: Self-pay | Admitting: Internal Medicine

## 2019-11-05 ENCOUNTER — Other Ambulatory Visit: Payer: Self-pay

## 2019-11-05 ENCOUNTER — Encounter
Admission: AD | Disposition: A | Discharge status: Home / Self Care | Payer: PPO | Source: Home / Self Care | Attending: Internal Medicine

## 2019-11-05 ENCOUNTER — Inpatient Hospital Stay (HOSPITAL_COMMUNITY)
Admission: AD | Admit: 2019-11-05 | Discharge: 2019-11-21 | DRG: 215 | Disposition: A | Discharge status: Home / Self Care | Payer: PPO | Source: Other Acute Inpatient Hospital | Attending: Cardiothoracic Surgery | Admitting: Cardiothoracic Surgery

## 2019-11-05 DIAGNOSIS — I252 Old myocardial infarction: Secondary | ICD-10-CM

## 2019-11-05 DIAGNOSIS — I5022 Chronic systolic (congestive) heart failure: Secondary | ICD-10-CM | POA: Diagnosis not present

## 2019-11-05 DIAGNOSIS — I480 Paroxysmal atrial fibrillation: Secondary | ICD-10-CM | POA: Diagnosis not present

## 2019-11-05 DIAGNOSIS — E1151 Type 2 diabetes mellitus with diabetic peripheral angiopathy without gangrene: Secondary | ICD-10-CM | POA: Diagnosis not present

## 2019-11-05 DIAGNOSIS — Z888 Allergy status to other drugs, medicaments and biological substances status: Secondary | ICD-10-CM | POA: Diagnosis not present

## 2019-11-05 DIAGNOSIS — I428 Other cardiomyopathies: Secondary | ICD-10-CM | POA: Diagnosis not present

## 2019-11-05 DIAGNOSIS — Z7902 Long term (current) use of antithrombotics/antiplatelets: Secondary | ICD-10-CM

## 2019-11-05 DIAGNOSIS — K219 Gastro-esophageal reflux disease without esophagitis: Secondary | ICD-10-CM | POA: Diagnosis present

## 2019-11-05 DIAGNOSIS — I2582 Chronic total occlusion of coronary artery: Secondary | ICD-10-CM | POA: Diagnosis not present

## 2019-11-05 DIAGNOSIS — I110 Hypertensive heart disease with heart failure: Secondary | ICD-10-CM | POA: Diagnosis present

## 2019-11-05 DIAGNOSIS — G8929 Other chronic pain: Secondary | ICD-10-CM | POA: Diagnosis present

## 2019-11-05 DIAGNOSIS — I200 Unstable angina: Secondary | ICD-10-CM | POA: Diagnosis present

## 2019-11-05 DIAGNOSIS — Z95811 Presence of heart assist device: Secondary | ICD-10-CM | POA: Diagnosis not present

## 2019-11-05 DIAGNOSIS — Z791 Long term (current) use of non-steroidal anti-inflammatories (NSAID): Secondary | ICD-10-CM | POA: Diagnosis not present

## 2019-11-05 DIAGNOSIS — I4892 Unspecified atrial flutter: Secondary | ICD-10-CM | POA: Diagnosis not present

## 2019-11-05 DIAGNOSIS — Z955 Presence of coronary angioplasty implant and graft: Secondary | ICD-10-CM

## 2019-11-05 DIAGNOSIS — I25110 Atherosclerotic heart disease of native coronary artery with unstable angina pectoris: Principal | ICD-10-CM | POA: Diagnosis present

## 2019-11-05 DIAGNOSIS — Z7982 Long term (current) use of aspirin: Secondary | ICD-10-CM | POA: Diagnosis not present

## 2019-11-05 DIAGNOSIS — Z87891 Personal history of nicotine dependence: Secondary | ICD-10-CM

## 2019-11-05 DIAGNOSIS — I2510 Atherosclerotic heart disease of native coronary artery without angina pectoris: Secondary | ICD-10-CM

## 2019-11-05 DIAGNOSIS — I5021 Acute systolic (congestive) heart failure: Secondary | ICD-10-CM | POA: Diagnosis not present

## 2019-11-05 DIAGNOSIS — E876 Hypokalemia: Secondary | ICD-10-CM | POA: Diagnosis not present

## 2019-11-05 DIAGNOSIS — I25118 Atherosclerotic heart disease of native coronary artery with other forms of angina pectoris: Secondary | ICD-10-CM | POA: Diagnosis not present

## 2019-11-05 DIAGNOSIS — Z720 Tobacco use: Secondary | ICD-10-CM | POA: Diagnosis not present

## 2019-11-05 DIAGNOSIS — M79671 Pain in right foot: Secondary | ICD-10-CM | POA: Diagnosis present

## 2019-11-05 DIAGNOSIS — Z833 Family history of diabetes mellitus: Secondary | ICD-10-CM

## 2019-11-05 DIAGNOSIS — Z79899 Other long term (current) drug therapy: Secondary | ICD-10-CM | POA: Diagnosis not present

## 2019-11-05 DIAGNOSIS — I2699 Other pulmonary embolism without acute cor pulmonale: Secondary | ICD-10-CM | POA: Diagnosis not present

## 2019-11-05 DIAGNOSIS — E785 Hyperlipidemia, unspecified: Secondary | ICD-10-CM | POA: Diagnosis present

## 2019-11-05 DIAGNOSIS — M109 Gout, unspecified: Secondary | ICD-10-CM | POA: Diagnosis present

## 2019-11-05 DIAGNOSIS — I255 Ischemic cardiomyopathy: Secondary | ICD-10-CM | POA: Diagnosis not present

## 2019-11-05 DIAGNOSIS — R0989 Other specified symptoms and signs involving the circulatory and respiratory systems: Secondary | ICD-10-CM | POA: Diagnosis not present

## 2019-11-05 DIAGNOSIS — I447 Left bundle-branch block, unspecified: Secondary | ICD-10-CM | POA: Diagnosis not present

## 2019-11-05 DIAGNOSIS — R570 Cardiogenic shock: Secondary | ICD-10-CM | POA: Diagnosis not present

## 2019-11-05 DIAGNOSIS — I361 Nonrheumatic tricuspid (valve) insufficiency: Secondary | ICD-10-CM | POA: Diagnosis not present

## 2019-11-05 DIAGNOSIS — R0602 Shortness of breath: Secondary | ICD-10-CM | POA: Diagnosis not present

## 2019-11-05 DIAGNOSIS — Z419 Encounter for procedure for purposes other than remedying health state, unspecified: Secondary | ICD-10-CM

## 2019-11-05 DIAGNOSIS — IMO0002 Reserved for concepts with insufficient information to code with codable children: Secondary | ICD-10-CM

## 2019-11-05 DIAGNOSIS — I509 Heart failure, unspecified: Secondary | ICD-10-CM | POA: Diagnosis not present

## 2019-11-05 DIAGNOSIS — I10 Essential (primary) hypertension: Secondary | ICD-10-CM | POA: Diagnosis not present

## 2019-11-05 DIAGNOSIS — J432 Centrilobular emphysema: Secondary | ICD-10-CM | POA: Diagnosis not present

## 2019-11-05 DIAGNOSIS — I340 Nonrheumatic mitral (valve) insufficiency: Secondary | ICD-10-CM | POA: Diagnosis not present

## 2019-11-05 DIAGNOSIS — Z09 Encounter for follow-up examination after completed treatment for conditions other than malignant neoplasm: Secondary | ICD-10-CM

## 2019-11-05 DIAGNOSIS — D62 Acute posthemorrhagic anemia: Secondary | ICD-10-CM | POA: Diagnosis not present

## 2019-11-05 DIAGNOSIS — J95821 Acute postprocedural respiratory failure: Secondary | ICD-10-CM | POA: Diagnosis not present

## 2019-11-05 DIAGNOSIS — I5023 Acute on chronic systolic (congestive) heart failure: Secondary | ICD-10-CM | POA: Diagnosis not present

## 2019-11-05 DIAGNOSIS — E782 Mixed hyperlipidemia: Secondary | ICD-10-CM | POA: Diagnosis present

## 2019-11-05 DIAGNOSIS — M25552 Pain in left hip: Secondary | ICD-10-CM | POA: Diagnosis present

## 2019-11-05 DIAGNOSIS — I5043 Acute on chronic combined systolic (congestive) and diastolic (congestive) heart failure: Secondary | ICD-10-CM | POA: Diagnosis not present

## 2019-11-05 DIAGNOSIS — Z8711 Personal history of peptic ulcer disease: Secondary | ICD-10-CM

## 2019-11-05 DIAGNOSIS — Z9889 Other specified postprocedural states: Secondary | ICD-10-CM

## 2019-11-05 DIAGNOSIS — I739 Peripheral vascular disease, unspecified: Secondary | ICD-10-CM | POA: Diagnosis not present

## 2019-11-05 DIAGNOSIS — R918 Other nonspecific abnormal finding of lung field: Secondary | ICD-10-CM | POA: Diagnosis not present

## 2019-11-05 DIAGNOSIS — J811 Chronic pulmonary edema: Secondary | ICD-10-CM | POA: Diagnosis not present

## 2019-11-05 DIAGNOSIS — D689 Coagulation defect, unspecified: Secondary | ICD-10-CM | POA: Diagnosis present

## 2019-11-05 DIAGNOSIS — I6529 Occlusion and stenosis of unspecified carotid artery: Secondary | ICD-10-CM | POA: Diagnosis present

## 2019-11-05 DIAGNOSIS — J90 Pleural effusion, not elsewhere classified: Secondary | ICD-10-CM | POA: Diagnosis not present

## 2019-11-05 DIAGNOSIS — Z8249 Family history of ischemic heart disease and other diseases of the circulatory system: Secondary | ICD-10-CM

## 2019-11-05 DIAGNOSIS — I517 Cardiomegaly: Secondary | ICD-10-CM | POA: Diagnosis not present

## 2019-11-05 DIAGNOSIS — J9811 Atelectasis: Secondary | ICD-10-CM | POA: Diagnosis not present

## 2019-11-05 DIAGNOSIS — I350 Nonrheumatic aortic (valve) stenosis: Secondary | ICD-10-CM | POA: Diagnosis not present

## 2019-11-05 DIAGNOSIS — I700 Atherosclerosis of aorta: Secondary | ICD-10-CM | POA: Diagnosis not present

## 2019-11-05 DIAGNOSIS — Z6827 Body mass index (BMI) 27.0-27.9, adult: Secondary | ICD-10-CM

## 2019-11-05 DIAGNOSIS — N179 Acute kidney failure, unspecified: Secondary | ICD-10-CM | POA: Diagnosis present

## 2019-11-05 DIAGNOSIS — M19049 Primary osteoarthritis, unspecified hand: Secondary | ICD-10-CM | POA: Diagnosis present

## 2019-11-05 DIAGNOSIS — Z951 Presence of aortocoronary bypass graft: Secondary | ICD-10-CM

## 2019-11-05 DIAGNOSIS — Z452 Encounter for adjustment and management of vascular access device: Secondary | ICD-10-CM | POA: Diagnosis not present

## 2019-11-05 DIAGNOSIS — E663 Overweight: Secondary | ICD-10-CM | POA: Diagnosis present

## 2019-11-05 HISTORY — PX: RIGHT/LEFT HEART CATH AND CORONARY ANGIOGRAPHY: CATH118266

## 2019-11-05 LAB — BASIC METABOLIC PANEL
Anion gap: 9 (ref 5–15)
BUN: 17 mg/dL (ref 8–23)
CO2: 25 mmol/L (ref 22–32)
Calcium: 8.7 mg/dL — ABNORMAL LOW (ref 8.9–10.3)
Chloride: 104 mmol/L (ref 98–111)
Creatinine, Ser: 0.91 mg/dL (ref 0.61–1.24)
GFR calc Af Amer: >60 mL/min (ref >60)
GFR calc non Af Amer: >60 mL/min (ref >60)
Glucose, Bld: 164 mg/dL — ABNORMAL HIGH (ref 70–99)
Potassium: 3.7 mmol/L (ref 3.5–5.1)
Sodium: 138 mmol/L (ref 135–145)

## 2019-11-05 LAB — BRAIN NATRIURETIC PEPTIDE: B Natriuretic Peptide: 468.7 pg/mL — ABNORMAL HIGH (ref 0.0–100.0)

## 2019-11-05 LAB — MAGNESIUM: Magnesium: 1.6 mg/dL — ABNORMAL LOW (ref 1.7–2.4)

## 2019-11-05 SURGERY — RIGHT/LEFT HEART CATH AND CORONARY ANGIOGRAPHY
Anesthesia: Moderate Sedation

## 2019-11-05 MED ORDER — HYDRALAZINE HCL 20 MG/ML IJ SOLN: 10.0000 mg | INTRAMUSCULAR | Status: AC | PRN

## 2019-11-05 MED ORDER — ONDANSETRON HCL 4 MG/2ML IJ SOLN: 4.0000 mg | Freq: Four times a day (QID) | INTRAMUSCULAR | Status: DC | PRN

## 2019-11-05 MED ORDER — HEPARIN SODIUM (PORCINE) 1000 UNIT/ML IJ SOLN
INTRAMUSCULAR | Status: DC | PRN
  Administered 2019-11-05: 4000 [IU] via INTRAVENOUS

## 2019-11-05 MED ORDER — FENTANYL CITRATE (PF) 100 MCG/2ML IJ SOLN
INTRAMUSCULAR | Status: DC | PRN
Start: 2019-11-05 — End: 2019-11-05
  Administered 2019-11-05: 25 ug via INTRAVENOUS

## 2019-11-05 MED ORDER — LABETALOL HCL 5 MG/ML IV SOLN: 10.0000 mg | INTRAVENOUS | Status: DC | PRN

## 2019-11-05 MED ORDER — AMLODIPINE BESYLATE 5 MG PO TABS: 5.0000 mg | ORAL_TABLET | Freq: Every day | ORAL | Status: DC

## 2019-11-05 MED ORDER — LISINOPRIL 20 MG PO TABS: 20.0000 mg | ORAL_TABLET | Freq: Every day | ORAL | Status: DC

## 2019-11-05 MED ORDER — ACETAMINOPHEN 500 MG PO TABS
1000.0000 mg | ORAL_TABLET | ORAL | Status: DC | PRN
  Administered 2019-11-05: 1000 mg via ORAL

## 2019-11-05 MED ORDER — ACETAMINOPHEN 325 MG PO TABS
650.0000 mg | ORAL_TABLET | ORAL | Status: DC | PRN
  Administered 2019-11-06 – 2019-11-10 (×6): 650 mg via ORAL
  Filled 2019-11-05 (×6): qty 2

## 2019-11-05 MED ORDER — FUROSEMIDE 10 MG/ML IJ SOLN: 20.0000 mg | Freq: Once | INTRAMUSCULAR | Status: DC

## 2019-11-05 MED ORDER — ASPIRIN 81 MG PO CHEW: 81.0000 mg | CHEWABLE_TABLET | ORAL | Status: DC

## 2019-11-05 MED ORDER — FUROSEMIDE 10 MG/ML IJ SOLN
20.0000 mg | Freq: Two times a day (BID) | INTRAMUSCULAR | Status: DC
  Administered 2019-11-06 – 2019-11-09 (×7): 20 mg via INTRAVENOUS
  Filled 2019-11-05 (×9): qty 2

## 2019-11-05 MED ORDER — LABETALOL HCL 5 MG/ML IV SOLN: 10.0000 mg | INTRAVENOUS | Status: AC | PRN

## 2019-11-05 MED ORDER — DOXEPIN HCL 10 MG PO CAPS
10.0000 mg | ORAL_CAPSULE | Freq: Once | ORAL | Status: AC | PRN
  Administered 2019-11-05: 10 mg via ORAL
  Filled 2019-11-05: qty 1

## 2019-11-05 MED ORDER — SODIUM CHLORIDE 0.9 % IV SOLN
INTRAVENOUS | Status: DC
  Administered 2019-11-05: 1000 mL via INTRAVENOUS

## 2019-11-05 MED ORDER — SODIUM CHLORIDE 0.9 % IV SOLN: 250.0000 mL | INTRAVENOUS | Status: DC | PRN

## 2019-11-05 MED ORDER — SODIUM CHLORIDE 0.9% FLUSH: 3.0000 mL | INTRAVENOUS | Status: DC | PRN

## 2019-11-05 MED ORDER — HEPARIN (PORCINE) IN NACL 1000-0.9 UT/500ML-% IV SOLN
INTRAVENOUS | Status: AC
  Filled 2019-11-05: qty 1000

## 2019-11-05 MED ORDER — PANTOPRAZOLE SODIUM 40 MG PO TBEC
40.0000 mg | DELAYED_RELEASE_TABLET | Freq: Every day | ORAL | Status: DC
  Administered 2019-11-06 – 2019-11-10 (×5): 40 mg via ORAL
  Filled 2019-11-05 (×5): qty 1

## 2019-11-05 MED ORDER — HYDRALAZINE HCL 20 MG/ML IJ SOLN: 10.0000 mg | INTRAMUSCULAR | Status: DC | PRN

## 2019-11-05 MED ORDER — ACETAMINOPHEN 500 MG PO TABS
ORAL_TABLET | ORAL | Status: AC
  Filled 2019-11-05: qty 2

## 2019-11-05 MED ORDER — ENOXAPARIN SODIUM 40 MG/0.4ML ~~LOC~~ SOLN
40.0000 mg | SUBCUTANEOUS | Status: DC
  Filled 2019-11-05: qty 0.4

## 2019-11-05 MED ORDER — VERAPAMIL HCL 2.5 MG/ML IV SOLN
INTRAVENOUS | Status: DC | PRN
  Administered 2019-11-05: 2.5 mg via INTRAVENOUS

## 2019-11-05 MED ORDER — MUPIROCIN 2 % EX OINT
1.0000 "application " | TOPICAL_OINTMENT | Freq: Two times a day (BID) | CUTANEOUS | Status: AC
  Administered 2019-11-07 – 2019-11-10 (×5): 1 via NASAL
  Filled 2019-11-05 (×4): qty 22

## 2019-11-05 MED ORDER — FENTANYL CITRATE (PF) 100 MCG/2ML IJ SOLN
INTRAMUSCULAR | Status: AC
  Filled 2019-11-05: qty 2

## 2019-11-05 MED ORDER — SODIUM CHLORIDE 0.9% FLUSH
3.0000 mL | Freq: Two times a day (BID) | INTRAVENOUS | Status: DC
  Administered 2019-11-05 – 2019-11-09 (×7): 3 mL via INTRAVENOUS

## 2019-11-05 MED ORDER — LOSARTAN POTASSIUM 50 MG PO TABS
50.0000 mg | ORAL_TABLET | Freq: Every day | ORAL | Status: DC
  Administered 2019-11-06 – 2019-11-08 (×3): 50 mg via ORAL
  Filled 2019-11-05 (×4): qty 1

## 2019-11-05 MED ORDER — ENOXAPARIN SODIUM 40 MG/0.4ML ~~LOC~~ SOLN
40.0000 mg | SUBCUTANEOUS | Status: DC
  Administered 2019-11-05 – 2019-11-06 (×2): 40 mg via SUBCUTANEOUS
  Filled 2019-11-05 (×2): qty 0.4

## 2019-11-05 MED ORDER — METOPROLOL SUCCINATE ER 25 MG PO TB24
25.0000 mg | ORAL_TABLET | Freq: Every day | ORAL | Status: DC
  Administered 2019-11-06 – 2019-11-08 (×3): 25 mg via ORAL
  Filled 2019-11-05 (×4): qty 1

## 2019-11-05 MED ORDER — VERAPAMIL HCL 2.5 MG/ML IV SOLN
INTRAVENOUS | Status: AC
  Filled 2019-11-05: qty 2

## 2019-11-05 MED ORDER — ASPIRIN EC 81 MG PO TBEC
81.0000 mg | DELAYED_RELEASE_TABLET | Freq: Every day | ORAL | Status: DC
  Administered 2019-11-06 – 2019-11-10 (×5): 81 mg via ORAL
  Filled 2019-11-05 (×5): qty 1

## 2019-11-05 MED ORDER — HEPARIN SODIUM (PORCINE) 1000 UNIT/ML IJ SOLN
INTRAMUSCULAR | Status: AC
  Filled 2019-11-05: qty 1

## 2019-11-05 MED ORDER — ACETAMINOPHEN 325 MG PO TABS: 650.0000 mg | ORAL_TABLET | ORAL | Status: DC | PRN

## 2019-11-05 MED ORDER — MIDAZOLAM HCL 2 MG/2ML IJ SOLN
INTRAMUSCULAR | Status: AC
  Filled 2019-11-05: qty 2

## 2019-11-05 MED ORDER — PNEUMOCOCCAL VAC POLYVALENT 25 MCG/0.5ML IJ INJ: 0.5000 mL | INJECTION | INTRAMUSCULAR | Status: DC

## 2019-11-05 MED ORDER — MIDAZOLAM HCL 2 MG/2ML IJ SOLN
INTRAMUSCULAR | Status: DC | PRN
  Administered 2019-11-05: 1 mg via INTRAVENOUS

## 2019-11-05 MED ORDER — SODIUM CHLORIDE 0.9% FLUSH: 3.0000 mL | Freq: Two times a day (BID) | INTRAVENOUS | Status: DC

## 2019-11-05 MED ORDER — IOHEXOL 300 MG/ML  SOLN
INTRAMUSCULAR | Status: DC | PRN
  Administered 2019-11-05: 70 mL

## 2019-11-05 MED ORDER — HEPARIN (PORCINE) IN NACL 1000-0.9 UT/500ML-% IV SOLN
INTRAVENOUS | Status: DC | PRN
  Administered 2019-11-05: 1000 mL

## 2019-11-05 SURGICAL SUPPLY — 12 items
CATH INFINITI 5 FR JL3.5 (CATHETERS) ×2 IMPLANT
CATH INFINITI JR4 5F (CATHETERS) ×2 IMPLANT
CATH SWAN GANZ 7F STRAIGHT (CATHETERS) ×2 IMPLANT
DEVICE RAD TR BAND REGULAR (VASCULAR PRODUCTS) ×2 IMPLANT
GLIDESHEATH SLEND SS 6F .021 (SHEATH) ×2 IMPLANT
GLIDESHEATH SLENDER 7FR .021G (SHEATH) ×4 IMPLANT
GUIDEWIRE .025 260CM (WIRE) ×2 IMPLANT
GUIDEWIRE EMER 3M J .025X150CM (WIRE) ×2 IMPLANT
GUIDEWIRE INQWIRE 1.5J.035X260 (WIRE) ×1 IMPLANT
INQWIRE 1.5J .035X260CM (WIRE) ×3
KIT MANI 3VAL PERCEP (MISCELLANEOUS) ×3 IMPLANT
PACK CARDIAC CATH (CUSTOM PROCEDURE TRAY) ×3 IMPLANT

## 2019-11-05 NOTE — H&P (Addendum)
Cardiology Admission History and Physical:   Patient ID: [name] MRN: [id number]; DOB: 1951/01/21   Admission date: 11/05/2019  Primary Care Provider: Glean Hess, MD [facility] Cardiologist: Nelva Bush, MD  [facility] Electrophysiologist:  None   Chief Complaint:  Evaluation for CABG due to multivessel CAD, HFrEF  Patient Profile:   [name] is a 69yo male with history of CAD with NSTEMI 07/2015 s/p PCI to LCx and R/LHC (11/05/19 with mRCA 50%, dLM to ost LAD 80%, 1st marginal 100%s),known LBBB,NICM with EF 40-45% on 01/2018 echo, PAD s/p mechanical thrombectomy and catheter-directed thrombolysis to left SFA 05/2018, carotid artery stenosis, HTN, HLD, hiatal hernia, previous tobacco use, and PUD  History of Present Illness:   [name] is a 69 yo male with PMH as above and history significant for CAD, NICM, and PAD.   He underwent LHC 10/2017 for chest pain that revealed a widely patent LCx stent without significant restenosis and otherwise mild to moderate CAD. He had mild to moderately reduced LVSF with EF 40-45% and mildly elevated LVEDP at 9mmg. Given his CP did not seem to be cardiac, recommendation was for continued medication management.   Echo at that time showed EF 45-50%. Repeat limited echo 01/2018 showed slightly worsened pump function at 40-45%. He underwent mechanical thrombectomy and cathter thrombolysis to the L SFA in early 05/2018 and reported minimal leg pain at that time.   When last seen by virtual visit by his primary cardiologist, he remained enrolled in the CLEAR study for management of his HLD. He was also enrolled in a PCSK9i trial, before the CLEAR trial, with marked reduction in his LDL without side effects. It was noted he may need to consider switching to a PCSK9i soon.   When seen in office 03/06/2019, he did note he was "not breathing as freely;"however, he did not feel certain that it would necessarily qualify as shortness of  breath. He reported that it was only transient andon rare occasions at rest. He was struggling to keep his weight off,especially givenhe did not plan to go to the gym during the pandemic. He continued to remain active at home as tolerated by his chronicpain(L hip, R foot).He was still enrolled in the [facility] for management of his hyperlipidemia with most recent labs as belowand LDL below goal 70.   On 07/16/2019, he called the office due to a change from his Praluent to [place].  When seen in clinic 10/21/19, he was still struggling with not breathing as freely at rest.  He also noted new DOE when going up hills and when playing golf.  He continued to play golf 4 to 5 days a week and work in the yard daily for physical activity. He was not checking his blood pressure at home.  His PCP provided him with an inhaler for his DOE that did not improve his breathing; however, he wondered if it was working correctly given his ongoing sx.  He continued to struggle with keeping the weight off.  He reported medication compliance. EKG showed SB, 53 bpm, QRS 64ms with history of known left bundle branch block, new T wave inversion noted in V1 to V6, I and aVL. Due to findings consistent with Wellen's sign on EKG, the on call fellow was notified and patient instructed to present to the ED, where repeat EKG showed NSR, LBBB, TWI in lateral leads and as seen in previous EKGs. He thus went home from the ED and was subsequently  set up for [facility].   On 11/05/2019, he underwent right and left heart cardiac catheterization that showed multivessel CAD, including calcified 80% distal left main stenosis, small diffusely disease distal LAD, chronically occluded OM1, and 50% mRCA lesion with widely patent mid left circumflex stent.  Also noted was moderately elevated left heart filling pressure and mild to moderately elevated right heart filling pressures.  Normal to mildly reduced cardiac output/index.. Recommendation based  on catheterization and severe LMCA dz with low LVEF was for transfer to [facility] for evaluation for CABG and management of heart failure.  Per cath report instructions, it was recommended echocardiogram be obtained for reassessment of EF.  Plavix was held in preparation for CVTS.  Aggressive secondary prevention was recommended.  Past Medical History:  Diagnosis Date  . Abnormal nuclear cardiac imaging test 08/08/2015  . Arthritis    fingers  . Carotid artery occlusion   . Coronary atherosclerosis of native coronary artery 01/29/2013   stent  . Duodenal erosion   . Encounter for screening for lung cancer 07/13/2016  . Esophageal stenosis    esophageal dilation  . GERD (gastroesophageal reflux disease)   . H. pylori infection   . Heart attack [facility]) Oct. 2009   Mild  . Hiatal hernia   . Hyperlipidemia   . Hypertension   . 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 Cardiac catheterization-nonobstructive 60% RCA disease-subsequent nuclear stress test-9 minutes, low risk, mild inferior wall hypokinesis   . Pain in limb 12/19/2017  . Peripheral vascular disease ([place])   . Unstable angina ([place]) 11/25/2017    Past Surgical History:  Procedure Laterality Date  . APPENDECTOMY    . BACK SURGERY    . CARDIAC CATHETERIZATION N/A 08/07/2015   Procedure: Left Heart Cath and Coronary Angiography;  Surgeon: [name], MD;  Location: [place] CV LAB;  Service: Cardiovascular;  Laterality: N/A;  . CARDIAC CATHETERIZATION N/A 08/07/2015   Procedure: Coronary Stent Intervention;  Surgeon: [name], MD;  Location: [place] CV LAB;  Service: Cardiovascular;  Laterality: N/A;  . CARDIAC CATHETERIZATION N/A 08/07/2015   Procedure: Coronary Stent Intervention;  Surgeon: [name], MD;  Location: [place] CV LAB;  Service: Cardiovascular;  Laterality: N/A;  . CAROTID ENDARTERECTOMY  01/05/2006   Right  CEA with DPA  . CATARACT EXTRACTION  W/ INTRAOCULAR LENS IMPLANT Left 12/04/2017  . CATARACT EXTRACTION W/PHACO Left 12/04/2017   Procedure: CATARACT EXTRACTION PHACO AND INTRAOCULAR LENS PLACEMENT ([place]) LEFT;  Surgeon: Eulogio Bear, MD;  Location: [place];  Service: Ophthalmology;  Laterality: Left;  . CATARACT EXTRACTION W/PHACO Right 02/06/2018   Procedure: CATARACT EXTRACTION PHACO AND INTRAOCULAR LENS PLACEMENT (I[place])RIGHT;  Surgeon: Eulogio Bear, MD;  Location: [place];  Service: Ophthalmology;  Laterality: Right;  . COLONOSCOPY  05/20/2008  . COLONOSCOPY WITH PROPOFOL N/A 09/13/2018   Procedure: COLONOSCOPY WITH BIOPSY;  Surgeon: [name], MD;  Location: [place];  Service: Endoscopy;  Laterality: N/A;  . CORONARY STENT PLACEMENT  08/07/2015   MID CIRCUMFLEX  . ESOPHAGOGASTRODUODENOSCOPY (EGD) WITH PROPOFOL N/A 02/11/2019   Procedure: ESOPHAGOGASTRODUODENOSCOPY (EGD) WITH BIOPSY and  Dilation;  Surgeon: [name], MD;  Location: [place];  Service: Endoscopy;  Laterality: N/A;  . HIP SURGERY Left 10/2016   left hip tendon repair  . LEFT HEART CATH AND CORONARY ANGIOGRAPHY N/A 11/27/2017   Procedure: LEFT HEART CATH AND CORONARY ANGIOGRAPHY;  Surgeon: Fletcher Anon,  Mertie Clause, MD;  Location: [place] CV LAB;  Service: Cardiovascular;  Laterality: N/A;  . LOWER EXTREMITY ANGIOGRAPHY Left 02/12/2018   Procedure: LOWER EXTREMITY ANGIOGRAPHY;  Surgeon: Algernon Huxley, MD;  Location: [place] CV LAB;  Service: Cardiovascular;  Laterality: Left;  . LOWER EXTREMITY ANGIOGRAPHY Left 03/07/2018   Procedure: LOWER EXTREMITY ANGIOGRAPHY;  Surgeon: Algernon Huxley, MD;  Location: [place] CV LAB;  Service: Cardiovascular;  Laterality: Left;  . LOWER EXTREMITY ANGIOGRAPHY Left 06/04/2018   Procedure: LOWER EXTREMITY ANGIOGRAPHY;  Surgeon: Algernon Huxley, MD;  Location: [place] CV LAB;  Service: Cardiovascular;  Laterality: Left;  . POLYPECTOMY N/A 09/13/2018   Procedure:  POLYPECTOMY;  Surgeon: [name], MD;  Location: [place];  Service: Endoscopy;  Laterality: N/A;  . POLYPECTOMY N/A 02/11/2019   Procedure: POLYPECTOMY;  Surgeon: [name], MD;  Location: [place];  Service: Endoscopy;  Laterality: N/A;  . SPINE SURGERY    . TONSILLECTOMY       Medications Prior to Admission: Prior to Admission medications   Medication Sig Start Date Zyaire Dumas Date Taking? Authorizing Provider  amLODipine (NORVASC) 5 MG tablet TAKE 1 TABLET BY MOUTH EVERY DAY Patient taking differently: Take 5 mg by mouth daily.  10/14/19   Gerik Coberly, Harrell Gave, MD  aspirin EC 81 MG tablet Take 1 tablet (81 mg total) by mouth daily. 06/05/18   Stegmayer, Joelene Millin A, PA-C  clopidogrel (PLAVIX) 75 MG tablet TAKE ONE TABLET BY MOUTH DAILY. Patient taking differently: Take 75 mg by mouth daily.  05/13/19   Kris Hartmann, NP  cyclobenzaprine (FLEXERIL) 10 MG tablet Take 1 tablet (10 mg total) by mouth 3 (three) times daily as needed for muscle spasms. 06/14/18   Glean Hess, MD  Evolocumab (REPATHA SURECLICK) 500 MG/ML SOAJ Inject 1 pen into the skin every 14 (fourteen) days. 10/08/19   Jaykub Mackins, Harrell Gave, MD  furosemide (LASIX) 20 MG tablet Take 1 tablet (20 mg) by mouth once daily x 3 days, then take 1 tablet daily as directed Patient not taking: Reported on 10/28/2019 10/21/19   Marrianne Mood D, PA-C  lisinopril (ZESTRIL) 20 MG tablet TAKE 1 TABLET BY MOUTH EVERY DAY Patient taking differently: Take 20 mg by mouth daily.  10/14/19   Dayshaun Whobrey, Harrell Gave, MD  meloxicam (MOBIC) 7.5 MG tablet Take 1 tablet (7.5 mg) by mouth once daily as needed    [provider]  metoprolol succinate (TOPROL-XL) 25 MG 24 hr tablet TAKE 1 TABLET BY MOUTH EVERY DAY Patient taking differently: Take 25 mg by mouth daily.  04/30/19   Ha Shannahan, Harrell Gave, MD  nitroGLYCERIN (NITROSTAT) 0.4 MG SL tablet Place 1 tablet (0.4 mg total) under the tongue every 5 (five) minutes as needed for chest pain.  08/08/15   Erlene Quan, PA-C  pantoprazole (PROTONIX) 40 MG tablet Take 1 tablet (40 mg total) by mouth daily. 09/13/19   [name], MD  triamcinolone cream (KENALOG) 0.1 % Apply 1 application topically 2 (two) times daily as needed for itching. 10/23/19   [provider]     Allergies:    Allergies  Allergen Reactions  . Brilinta [Ticagrelor] Shortness Of Breath  . Zetia [Ezetimibe] Other (See Comments)    Muscle aches  . Statins Other (See Comments)    Failed Crestor 5 mg twice weekly, Crestor 20 mg daily, Pravastatin 40 mg qd, Lipitor, Zocor - muscle aches    Social History:   Social History   Socioeconomic History  .  Marital status: Married    Spouse name: Not on file  . Number of children: 1  . Years of education: Not on file  . Highest education level: Bachelor's degree (e.g., BA, AB, BS)  Occupational History  . Not on file  Tobacco Use  . Smoking status: Former Smoker    Packs/day: 1.25    Years: 35.00    Pack years: 43.75    Types: Cigarettes    Quit date: 02/28/2005    Years since quitting: 14.6  . Smokeless tobacco: Current User    Types: Snuff  . Tobacco comment: occaisionally  Vaping Use  . Vaping Use: Never used  Substance and Sexual Activity  . Alcohol use: Yes    Alcohol/week: 8.0 - 10.0 standard drinks    Types: 8 - 10 Glasses of wine per week  . Drug use: No  . Sexual activity: Yes  Other Topics Concern  . Not on file  Social History Narrative  . Not on file   Social Determinants of Health   Financial Resource Strain: Low Risk   . Difficulty of Paying Living Expenses: Not hard at all  Food Insecurity: No Food Insecurity  . Worried About Charity fundraiser in the Last Year: Never true  . Ran Out of Food in the Last Year: Never true  Transportation Needs: No Transportation Needs  . Lack of Transportation (Medical): No  . Lack of Transportation (Non-Medical): No  Physical Activity: Sufficiently Active  . Days of Exercise per Week:  4 days  . Minutes of Exercise per Session: 60 min  Stress: No Stress Concern Present  . Feeling of Stress : Not at all  Social Connections: Moderately Integrated  . Frequency of Communication with Friends and Family: More than three times a week  . Frequency of Social Gatherings with Friends and Family: Three times a week  . Attends Religious Services: Never  . Active Member of Clubs or Organizations: Yes  . Attends Archivist Meetings: More than 4 times per year  . Marital Status: Married  Human resources officer Violence: Not At Risk  . Fear of Current or Ex-Partner: No  . Emotionally Abused: No  . Physically Abused: No  . Sexually Abused: No    Family History:   The patient's family history includes Cancer (age of onset: 27) in his father; Coronary artery disease in his mother; Diabetes in his mother; Heart attack in his father and mother; Heart disease in his father and mother; Hypertension in his father and mother; Stroke in his father. There is no history of Colon cancer, Colon polyps, Esophageal cancer, Rectal cancer, or Stomach cancer.    ROS:  Please see the history of present illness.  Review of Systems  Respiratory: Positive for shortness of breath.   Cardiovascular: Negative for chest pain.  All other systems reviewed and are negative.  All other ROS reviewed and negative.     Physical Exam/Data:   Vitals:   11/05/19 2055 11/05/19 2100  BP: 137/79 122/85  Pulse: 60 60  Resp: 20 15  Temp: 97.9 F (36.6 C)   TempSrc: Oral   SpO2: 92%   Weight: 79.7 kg   Height: 5\' 6"  (1.676 m)    No intake or output data in the 24 hours ending 11/05/19 2207 Last 3 Weights 11/05/2019 10/21/2019 10/21/2019  Weight (lbs) 175 lb 176 lb 179 lb 4 oz  Weight (kg) 79.379 kg 79.833 kg 81.307 kg     Body mass index  is 28.34 kg/m.  Please refer    EKG:  The ECG that was done 10/21/19 was personally reviewed and demonstrates SB, 53 bpm, QRS 54ms with history of known left bundle branch  block, new T wave inversion noted in V1 to V6, I and aVL  Relevant CV Studies: 11/05/19 Conclusions: 1. Multivessel CAD, including calcified 80% distal LMCA stenosis, small diffusely diseased distal LAD, chronically occluded OM1, and 50% mid RCA lesion. 2. Widely patent mid LCx stent. 3. Moderately elevated left heart filling pressure. 4. Mildly to moderately elevated right heart filling pressure. 5. Normal to mildly reduced cardiac output/index.  Recommendations: 1. Transfer to [facility] for cardiac surgery consultation for CABG, given severe LMCA disease and low LVEF. 2. Hold clopidogrel pending cardiac surgery consultation. 3. Obtain echocardiogram. 4. Aggressive secondary prevention.  Coronary Findings Diagnostic Dominance: Right Left Main  Vessel is large.  Dist LM to Ost LAD lesion is 80% stenosed. The lesion is calcified.  Left Anterior Descending  Vessel is moderate in size.  First Diagonal Branch  Vessel is small in size.  Second Diagonal Branch  Vessel is moderate in size.  Left Circumflex  Previously placed Mid Cx stent (unknown type) is widely patent.  First Obtuse Marginal Branch  Vessel is moderate in size.  Collaterals  1st Mrg filled by collaterals from Dist LAD.    1st Mrg lesion is 100% stenosed. The lesion is chronically occluded with left-to-left collateral flow.  Second Obtuse Marginal Branch  Vessel is small in size.  Third Obtuse Marginal Branch  Vessel is moderate in size.  Fourth Obtuse Marginal Branch  Vessel is moderate in size.  Right Coronary Artery  Vessel is large.  Mid RCA lesion is 50% stenosed. The lesion is eccentric.  Intervention  No interventions have been documented. Coronary Diagrams  Diagnostic Dominance: Right  Right Heart Pressures RA (mean): 12 mmHg RV (S/EDP): 45/12 mmHg PA (S/D, mean): 45/25 mmHg PCWP (mean): 25 mmHg  Ao sat: 95% PA sat: 67%  Fick CO: 3.9 L/min Fick CI: 2.1 L/min/m^2  Thermodilution CO: 5.2  L/min Thermodilution CI: 2.7 L/min/m^2  Wall Motion  Resting          Echo 02/27/2018 - Limited study for evaluation of left ventricular function. - Left ventricle: The cavity size was mildly dilated. Wall thickness was increased in a pattern of mild LVH. Systolic function was mildly to moderately reduced. The estimated ejection fraction was in the range of 40% to 45%. There is hypokinesis of the anteroseptal, anterior, and apical myocardium. Doppler parameters are consistent with abnormal left ventricular relaxation (grade 1 diastolic dysfunction).  Echo 11/25/2017 - Left ventricle: The cavity size was normal. Wall thickness was  increased in a pattern of mild LVH. Systolic function was mildly  reduced. The estimated ejection fraction was in the range of 45%  to 50%. Regional wall motion abnormalities cannot be excluded.  Doppler parameters are consistent with abnormal left ventricular  relaxation (grade 1 diastolic dysfunction).  - Ventricular septum: Septal motion showed abnormal function. These  changes are consistent with a left bundle branch block.  - Aortic valve: There was trivial regurgitation.  - Mitral valve: There was mild regurgitation.  - Left atrium: The atrium was mildly dilated.  - Right ventricle: The cavity size was normal. Systolic function  was normal.   LHC 11/27/2017  Prox RCA to Mid RCA lesion is 40% stenosed.  Prox LAD lesion is 30% stenosed.  Previously placed Mid Cx stent (unknown type) is  widely patent.  Ost 2nd Mrg lesion is 30% stenosed.  Dist Cx lesion is 40% stenosed.  There is mild to moderate left ventricular systolic dysfunction.  LV Shavaughn Seidl diastolic pressure is mildly elevated. 1. Widely patent left circumflex stent with no significant restenosis. Otherwise mild to moderate nonobstructive coronary artery disease. 2. Mild to moderately reduced LV systolic function with an EF of 40 to 45%. 3. Mildly  elevated left ventricular Shaunita Seney-diastolic pressure at 13 mmHg. Recommendations: The patient's chest pain does not seem to be cardiac. He does have cardiomyopathy which seems to be nonischemic. Continue treatment with a beta-blocker and ACE inhibitor. Uptitrate as tolerated.   Laboratory Data:  High Sensitivity Troponin:  No results for input(s): TROPONINIHS in the last 720 hours.    ChemistryNo results for input(s): NA, K, CL, CO2, GLUCOSE, BUN, CREATININE, CALCIUM, GFRNONAA, GFRAA, ANIONGAP in the last 168 hours.  No results for input(s): PROT, ALBUMIN, AST, ALT, ALKPHOS, BILITOT in the last 168 hours. HematologyNo results for input(s): WBC, RBC, HGB, HCT, MCV, MCH, MCHC, RDW, PLT in the last 168 hours. BNPNo results for input(s): BNP, PROBNP in the last 168 hours.  DDimer No results for input(s): DDIMER in the last 168 hours.   Radiology/Studies:  No results found. {  Assessment and Plan:   Coronary artery disease, multiple vessel  --No current CP. 10/2018 LHC with patent LCx stent to moderate, nonobstructive CAD. 8/23 EKG with Wellen's sign, resolved with repeat EKG in the clinic. Subsequent 11/05/19 LHC with multivessel CAD as above with severe LMCA dz and low LVEF with recommendation for CABG. Case has been discussed with [place] and HeartCare Team and [place] contacted. Orders are done and pended, ready for release by [facility] team as we are unable to release them at this time due to lack of account number associated with the transfer.  Repeat echo to reassess EF and structure in the setting of the above.   Hold clopidogrel given evaluation for CABG.  Continue Lovenox. Continue ASA, BB. Daily BMET, CBC.  NPO after midnight in preparation for CVTS/CABG.  Aggressive secondary prevention and risk factor modification with Repatha q2w as scheduled.   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 RHC pressures as  above with moderately elevated LV filling pressure and mild to moderately elevated RH filling pressure. PCWP 40mmHg. CO 3.9, CI 2.1  Previous echo with LVEF 40 to 45%. Based on RHC, severe LVSF with LVEDP 25-92mmHg and LVEF 25-35% by visual estimate.   Obtain repeat echo as above.  Not on a PTA diuretic. Given RHC pressures as above, received IV Lasix 20 mg x 1 at [facility]. Recommend BMET once at [facility] to reassess Cr, BUN, K, Mg. Monitor volume status closely with further diuresis as indicated and tolerated by renal function and electrolytes.  Continue to monitor I's/O's, daily standing weights.  As above, BMET, CBC once transferred to [facility]. Daily BMET/CBC.  Continue medical management with BB and lisinopril as renal function tolerates.   Hypertension --Blood pressure stable. Continue currentmedical management.  Peripheral arterial disease --S/p bilateral lower extremity interventions by Dr. Lucky Cowboy. No significant / worsening pain inlower extremities oredema on exam. No S/SX of bleeding on DAPT. Continue statin control, ASA.  HLDwith statin intolerance 2/2 myalgia --As above, patient has a history of intolerance to statins/myalgias on statins. He is enrolled in the CLEARresearch study withPraluentinjections every 2 weeks and significant improvement in his lipids. Most recentLDL at [employer]  than 70and most recent LDLc 68. Continue ongoing lipid management.    As I am unclear on the timing of his PCSK9 inhibitor injections, pharmacy consultation has been placed to clarify this information to ensure aggressive risk factor modification.  Severity of Illness: The appropriate patient status for this patient is INPATIENT. Inpatient status is judged to be reasonable and necessary in order to provide the required intensity of service to ensure the patient's safety. The patient's presenting symptoms, physical exam findings, and initial radiographic and laboratory data in the context  of their chronic comorbidities is felt to place them at high risk for further clinical deterioration. Furthermore, it is not anticipated that the patient will be medically stable for discharge from the hospital within 2 midnights of admission. The following factors support the patient status of inpatient.   " The patient's presenting symptoms include CAD, CHF. " The worrisome physical exam findings include CAD, CHF. " The initial radiographic and laboratory data are worrisome because ofCAD, CHF. " The chronic co-morbidities include CAD, CHF.   * I certify that at the point of admission it is my clinical judgment that the patient will require inpatient hospital care spanning beyond 2 midnights from the point of admission due to high intensity of service, high risk for further deterioration and high frequency of surveillance required.*    For questions or updates, please contact [place] Please consult [email or web address] for contact info under     Signed, Arvil Chaco, PA-C  11/05/2019 11:45 AM   I have independently seen and examined the patient and agree with the findings and plan, as documented in the PA/NP's note, with the following additions/changes.  [name] is a 69 year old man with history of CAD status post remote PCI to LCx, nonischemic cardiomyopathy with LVEF of 40-45% by most recent echo in 2019, PAD, hypertension, hyperlipidemia, and prior tobacco use, transferred from [facility] for surgical evaluation for CABG.  Patient reports several weeks of worsening exertional dyspnea, though he is still able to play golf regularly.  He recently was seen for evaluation of his dyspnea in the 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  He was referred to the emergency department where repeat EKG showed LBBB and remainder of work-up was unrevealing.  He was referred for cardiac  catheterization today which showed reduced LVEF and severe distal LMCA stenosis as well as moderate mid RCA disease.  Left heart filling pressures were also moderately elevated.  He has been transferred to [facility] for medical optimization and cardiac surgery evaluation.  Currently he is asymptomatic, denying chest pain, shortness of breath, palpitations, lightheadedness, and edema.  Exam is notable for regular rate and rhythm without murmurs.  Lungs are clear.  There is no lower extremity edema.  Right radial and brachial catheterization sites are without bleeding and hematoma.  Cardiac surgery has been contacted for evaluation.  Given that [name] has been on dual antiplatelet therapy with aspirin and clopidogrel for PAD, we will need to allow clopidogrel to washout.  We will continue aspirin 81 mg daily.  As he is intolerant to statins, [name] will need to consider continue with his outpatient PCSK9 macular following hospitalization.  In regard to his heart failure, he has been given furosemide 20 mg IV x1 prior to leaving [facility].  We will continue metoprolol succinate at his home dose and transition to losartan with the  hope of eventually switching to [place] down the road.  I will hold amlodipine to allow for escalation of evidence-based heart failure therapy.  Continue diuresis with furosemide 20 mg IV twice daily; dose escalation may be needed based on response.  Echocardiogram is pending.  Nelva Bush, MD [facility]

## 2019-11-05 NOTE — Interval H&P Note (Signed)
History and Physical Interval Note:  11/05/2019 9:34 AM  [name]  has presented today for surgery, with the diagnosis of shortness of breath and abnormal EKG.  The various methods of treatment have been discussed with the patient and family. After consideration of risks, benefits and other options for treatment, the patient has consented to  Procedure(s): LEFT HEART CATH AND CORONARY ANGIOGRAPHY (Left) as a surgical intervention.  Given shortness of breath, we have also agreed to perform a right heart catheterization.  The patient's history has been reviewed, patient examined, no change in status, stable for surgery.  I have reviewed the patient's chart and labs.  Questions were answered to the patient's satisfaction.    Cath Lab Visit (complete for each Cath Lab visit)  Clinical Evaluation Leading to the Procedure:   ACS: No.  Non-ACS:    Anginal Classification: CCS III  Anti-ischemic medical therapy: Maximal Therapy (2 or more classes of medications)  Non-Invasive Test Results: No non-invasive testing performed  Prior CABG: No previous CABG  Rafaella Kole

## 2019-11-06 ENCOUNTER — Other Ambulatory Visit (HOSPITAL_COMMUNITY): Payer: PPO

## 2019-11-06 ENCOUNTER — Other Ambulatory Visit: Payer: Self-pay | Admitting: *Deleted

## 2019-11-06 ENCOUNTER — Inpatient Hospital Stay (HOSPITAL_COMMUNITY): Payer: PPO

## 2019-11-06 ENCOUNTER — Encounter: Payer: Self-pay | Admitting: Internal Medicine

## 2019-11-06 ENCOUNTER — Telehealth: Payer: Self-pay | Admitting: Pharmacist

## 2019-11-06 ENCOUNTER — Other Ambulatory Visit: Payer: Self-pay | Admitting: Cardiothoracic Surgery

## 2019-11-06 DIAGNOSIS — I739 Peripheral vascular disease, unspecified: Secondary | ICD-10-CM

## 2019-11-06 DIAGNOSIS — I340 Nonrheumatic mitral (valve) insufficiency: Secondary | ICD-10-CM

## 2019-11-06 DIAGNOSIS — I361 Nonrheumatic tricuspid (valve) insufficiency: Secondary | ICD-10-CM

## 2019-11-06 DIAGNOSIS — I25110 Atherosclerotic heart disease of native coronary artery with unstable angina pectoris: Secondary | ICD-10-CM

## 2019-11-06 DIAGNOSIS — I447 Left bundle-branch block, unspecified: Secondary | ICD-10-CM

## 2019-11-06 DIAGNOSIS — I509 Heart failure, unspecified: Secondary | ICD-10-CM

## 2019-11-06 DIAGNOSIS — I10 Essential (primary) hypertension: Secondary | ICD-10-CM

## 2019-11-06 DIAGNOSIS — E785 Hyperlipidemia, unspecified: Secondary | ICD-10-CM

## 2019-11-06 DIAGNOSIS — I2510 Atherosclerotic heart disease of native coronary artery without angina pectoris: Secondary | ICD-10-CM

## 2019-11-06 DIAGNOSIS — I255 Ischemic cardiomyopathy: Secondary | ICD-10-CM

## 2019-11-06 LAB — BASIC METABOLIC PANEL
Anion gap: 13 (ref 5–15)
BUN: 20 mg/dL (ref 8–23)
CO2: 24 mmol/L (ref 22–32)
Calcium: 9.4 mg/dL (ref 8.9–10.3)
Chloride: 100 mmol/L (ref 98–111)
Creatinine, Ser: 1.06 mg/dL (ref 0.61–1.24)
GFR calc Af Amer: >60 mL/min (ref >60)
GFR calc non Af Amer: >60 mL/min (ref >60)
Glucose, Bld: 150 mg/dL — ABNORMAL HIGH (ref 70–99)
Potassium: 3.9 mmol/L (ref 3.5–5.1)
Sodium: 137 mmol/L (ref 135–145)

## 2019-11-06 LAB — ECHOCARDIOGRAM COMPLETE
Area-P 1/2: 2.37 cm2
Height: 66 in
Weight: 2797.2 oz

## 2019-11-06 LAB — CBC
HCT: 44.5 % (ref 39.0–52.0)
Hemoglobin: 14.7 g/dL (ref 13.0–17.0)
MCH: 30.8 pg (ref 26.0–34.0)
MCHC: 33 g/dL (ref 30.0–36.0)
MCV: 93.3 fL (ref 80.0–100.0)
Platelets: 236 10*3/uL (ref 150–400)
RBC: 4.77 MIL/uL (ref 4.22–5.81)
RDW: 12.3 % (ref 11.5–15.5)
WBC: 5.8 10*3/uL (ref 4.0–10.5)
nRBC: 0 % (ref 0.0–0.2)

## 2019-11-06 LAB — POCT I-STAT 7, (LYTES, BLD GAS, ICA,H+H)
Acid-Base Excess: 4 mmol/L — ABNORMAL HIGH (ref 0.0–2.0)
Bicarbonate: 27.1 mmol/L (ref 20.0–28.0)
Calcium, Ion: 1.18 mmol/L (ref 1.15–1.40)
HCT: 44 % (ref 39.0–52.0)
Hemoglobin: 15 g/dL (ref 13.0–17.0)
O2 Saturation: 95 %
Potassium: 3.9 mmol/L (ref 3.5–5.1)
Sodium: 137 mmol/L (ref 135–145)
TCO2: 28 mmol/L (ref 22–32)
pCO2 arterial: 37 mmHg (ref 32.0–48.0)
pH, Arterial: 7.473 — ABNORMAL HIGH (ref 7.350–7.450)
pO2, Arterial: 68 mmHg — ABNORMAL LOW (ref 83.0–108.0)

## 2019-11-06 LAB — HIV ANTIBODY (ROUTINE TESTING W REFLEX): HIV Screen 4th Generation wRfx: NONREACTIVE

## 2019-11-06 LAB — PULMONARY FUNCTION TEST
FEF 25-75 Pre: 3.41 L/sec
FEF2575-%Pred-Pre: 157 %
FEV1-%Pred-Pre: 94 %
FEV1-Pre: 2.65 L
FEV1FVC-%Pred-Pre: 114 %
FEV6-%Pred-Pre: 86 %
FEV6-Pre: 3.11 L
FEV6FVC-%Pred-Pre: 105 %
FVC-%Pred-Pre: 82 %
FVC-Pre: 3.13 L
Pre FEV1/FVC ratio: 85 %
Pre FEV6/FVC Ratio: 99 %

## 2019-11-06 LAB — URINALYSIS, ROUTINE W REFLEX MICROSCOPIC
Bilirubin Urine: NEGATIVE
Glucose, UA: NEGATIVE mg/dL
Hgb urine dipstick: NEGATIVE
Ketones, ur: NEGATIVE mg/dL
Leukocytes,Ua: NEGATIVE
Nitrite: NEGATIVE
Protein, ur: NEGATIVE mg/dL
Specific Gravity, Urine: 1.005 (ref 1.005–1.030)
pH: 8 (ref 5.0–8.0)

## 2019-11-06 LAB — SURGICAL PCR SCREEN
MRSA, PCR: NEGATIVE
Staphylococcus aureus: NEGATIVE

## 2019-11-06 LAB — PLATELET INHIBITION P2Y12: Platelet Function  P2Y12: 132 [PRU] — ABNORMAL LOW (ref 182–335)

## 2019-11-06 MED ORDER — PRALUENT 150 MG/ML ~~LOC~~ SOAJ: 1.0000 "pen " | SUBCUTANEOUS | 11 refills | Status: DC

## 2019-11-06 MED ORDER — IOHEXOL 300 MG/ML  SOLN
75.0000 mL | Freq: Once | INTRAMUSCULAR | Status: AC | PRN
  Administered 2019-11-06: 75 mL via INTRAVENOUS

## 2019-11-06 MED ORDER — MAGNESIUM SULFATE 4 GM/100ML IV SOLN
4.0000 g | Freq: Once | INTRAVENOUS | Status: AC
  Administered 2019-11-06: 4 g via INTRAVENOUS
  Filled 2019-11-06 (×2): qty 100

## 2019-11-06 MED ORDER — POTASSIUM CHLORIDE CRYS ER 20 MEQ PO TBCR
40.0000 meq | EXTENDED_RELEASE_TABLET | Freq: Once | ORAL | Status: AC
  Administered 2019-11-06: 40 meq via ORAL
  Filled 2019-11-06: qty 2

## 2019-11-06 MED ORDER — SODIUM CHLORIDE 0.9 % IV SOLN
2.0000 g | INTRAVENOUS | Status: DC
  Administered 2019-11-06 – 2019-11-10 (×5): 2 g via INTRAVENOUS
  Filled 2019-11-06 (×2): qty 20
  Filled 2019-11-06: qty 2
  Filled 2019-11-06: qty 20
  Filled 2019-11-06: qty 2
  Filled 2019-11-06 (×2): qty 20
  Filled 2019-11-06: qty 2

## 2019-11-06 MED ORDER — METOLAZONE 2.5 MG PO TABS
5.0000 mg | ORAL_TABLET | Freq: Once | ORAL | Status: AC
  Administered 2019-11-06: 5 mg via ORAL
  Filled 2019-11-06: qty 2

## 2019-11-06 NOTE — Telephone Encounter (Signed)
The CLEAR study is investigating bempedoic acid - he had his last phone call from research in July so he should still be in this study. He should also be on Praluent, not Repatha. It looks like an Therapist, sports in [employer] office got a prior authorization request for [place] and submitted it and then ordered the med when he had been taking Praluent for years, I'm not sure why the pharmacy would have sent him a PA request for [place]. He should continue on Praluent every 2 weeks, I'll send a refill in to his pharmacy for him and remove the [place].  Thanks, Eliab Closson ===View-only below this line=== ----- Message ----- From: Cala Bradford, [facility] Sent: 11/06/2019   2:46 PM EDT To: Leeroy Bock, RPH-CPP, * Subject: Clear Study                                    Hi,  Are you aware of this patient? He is inpatient for CABG. It looks like this might have followed up at [facility].   Per 8/23 office note: "He is enrolled in the CLEARresearch study withPraluentinjections every 2 weeks and significant improvement in his lipids.Marland KitchenMarland KitchenWe will look into the reason behind switching from Praluent to [place]."   -Per pt, got last Repatha on 9/1, next due 9/14. We currently have him listed on Repatha. Is this correct?  Let us know if we should follow up with a different team.  Thanks,  Judson Roch

## 2019-11-06 NOTE — Consult Note (Signed)
[address]       [place] [address]             [phone]        Dewitt R Ry[place] Medical Record #[id number] Date of Birth: May 18, 1950  Referring: No ref. provider found Primary Care: Glean Hess, MD Primary Cardiologist:Christopher End, MD  Chief Complaint: Exertional shortness of breath   History of Present Illness:     Patient examined, images of coronary angiogram personally reviewed and discussed with patient.  Echocardiogram and chest CT scan images still pending.  69 year old diabetic reformed smoker with hypertension and peripheral vascular disease (right carotid endarterectomy, left femoral artery stent) presents with exertional shortness of breath and decreased exercise tolerance.  A stress test was positive.  Cardiac catheterization by Dr. Saunders Revel demonstrated 80% distal left main stenosis with severe three-vessel diabetic pattern of disease.  LVEF is 30% with LVEDP of 30.  He is transferred to [facility] for surgical evaluation.  The patient's last surgical procedure was on his left hip and he had no anesthetic or cardiac complications.  He stopped smoking over 10 years ago.  He has been on [place]-term Plavix for the left SFA stent performed 2020   Current Activity/ Functional Status: Retired, plays golf 5 days a week lives with his wife good functional status   Zubrod Score: At the time of surgery this patient's most appropriate activity status/level should be described as: []     0    Normal activity, no symptoms []     1    Restricted in physical strenuous activity but ambulatory, able to do out light work [x]     2    Ambulatory and capable of self care, unable to do work activities, up and about                 more than 50%  Of the time                            []     3    Only limited self care, in bed greater than 50% of waking hours []     4    Completely disabled, no self care, confined to bed or chair []     5    Moribund  Past  Medical History:  Diagnosis Date  . Abnormal nuclear cardiac imaging test 08/08/2015  . Arthritis    fingers  . Carotid artery occlusion   . Coronary atherosclerosis of native coronary artery 01/29/2013   11/05/19 R/LHC 80% dLMCA stenosis small diffusely dz dLAD, chronically occluded OM1 50%mRCA lesion, widely patent mLCx strent, moderately elevated L heart filling pressures, mild to moderate RH filling pressures, normal to moderately reduced CO  . Duodenal erosion   . Encounter for screening for lung cancer 07/13/2016  . Esophageal stenosis    esophageal dilation  . GERD (gastroesophageal reflux disease)   . H. pylori infection   . Heart attack [facility]) Oct. 2009   Mild  . Hiatal hernia   . Hyperlipidemia   . Hypertension   . 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 Cardiac catheterization-nonobstructive 60% RCA disease-subsequent nuclear stress test-9 minutes, low risk, mild inferior wall hypokinesis   . Pain in limb 12/19/2017  . Peripheral vascular disease ([place])   . Unstable angina ([place]) 11/25/2017    Past Surgical History:  Procedure Laterality Date  .  APPENDECTOMY    . BACK SURGERY    . CARDIAC CATHETERIZATION N/A 08/07/2015   Procedure: Left Heart Cath and Coronary Angiography;  Surgeon: [name], MD;  Location: [place] CV LAB;  Service: Cardiovascular;  Laterality: N/A;  . CARDIAC CATHETERIZATION N/A 08/07/2015   Procedure: Coronary Stent Intervention;  Surgeon: [name], MD;  Location: [place] [facility];  Service: Cardiovascular;  Laterality: N/A;  . CARDIAC CATHETERIZATION N/A 08/07/2015   Procedure: Coronary Stent Intervention;  Surgeon: Magdalyn Arenivas M Martinique, MD;  Location: [place] CV LAB;  Service: Cardiovascular;  Laterality: N/A;  . CAROTID ENDARTERECTOMY  01/05/2006   Right  CEA with DPA  . CATARACT EXTRACTION W/ INTRAOCULAR LENS IMPLANT Left 12/04/2017  . CATARACT EXTRACTION W/PHACO Left 12/04/2017    Procedure: CATARACT EXTRACTION PHACO AND INTRAOCULAR LENS PLACEMENT ([place]) LEFT;  Surgeon: Eulogio Bear, MD;  Location: [place];  Service: Ophthalmology;  Laterality: Left;  . CATARACT EXTRACTION W/PHACO Right 02/06/2018   Procedure: CATARACT EXTRACTION PHACO AND INTRAOCULAR LENS PLACEMENT (IOC)RIGHT;  Surgeon: Eulogio Bear, MD;  Location: [place];  Service: Ophthalmology;  Laterality: Right;  . COLONOSCOPY  05/20/2008  . COLONOSCOPY WITH PROPOFOL N/A 09/13/2018   Procedure: COLONOSCOPY WITH BIOPSY;  Surgeon: [name], MD;  Location: [place];  Service: Endoscopy;  Laterality: N/A;  . CORONARY STENT PLACEMENT  08/07/2015   MID CIRCUMFLEX  . ESOPHAGOGASTRODUODENOSCOPY (EGD) WITH PROPOFOL N/A 02/11/2019   Procedure: ESOPHAGOGASTRODUODENOSCOPY (EGD) WITH BIOPSY and  Dilation;  Surgeon: [name], MD;  Location: [place];  Service: Endoscopy;  Laterality: N/A;  . HIP SURGERY Left 10/2016   left hip tendon repair  . LEFT HEART CATH AND CORONARY ANGIOGRAPHY N/A 11/27/2017   Procedure: LEFT HEART CATH AND CORONARY ANGIOGRAPHY;  Surgeon: Wellington Hampshire, MD;  Location: [place] CV [facility];  Service: Cardiovascular;  Laterality: N/A;  . LOWER EXTREMITY ANGIOGRAPHY Left 02/12/2018   Procedure: LOWER EXTREMITY ANGIOGRAPHY;  Surgeon: Algernon Huxley, MD;  Location: [place] CV LAB;  Service: Cardiovascular;  Laterality: Left;  . LOWER EXTREMITY ANGIOGRAPHY Left 03/07/2018   Procedure: LOWER EXTREMITY ANGIOGRAPHY;  Surgeon: Algernon Huxley, MD;  Location: [place] [facility];  Service: Cardiovascular;  Laterality: Left;  . LOWER EXTREMITY ANGIOGRAPHY Left 06/04/2018   Procedure: LOWER EXTREMITY ANGIOGRAPHY;  Surgeon: Algernon Huxley, MD;  Location: [place] CV [facility];  Service: Cardiovascular;  Laterality: Left;  . POLYPECTOMY N/A 09/13/2018   Procedure: POLYPECTOMY;  Surgeon: [name], MD;  Location: [place];  Service: Endoscopy;   Laterality: N/A;  . POLYPECTOMY N/A 02/11/2019   Procedure: POLYPECTOMY;  Surgeon: [name], MD;  Location: [place];  Service: Endoscopy;  Laterality: N/A;  . RIGHT/LEFT HEART CATH AND CORONARY ANGIOGRAPHY N/A 11/05/2019   Procedure: RIGHT/LEFT HEART CATH AND CORONARY ANGIOGRAPHY;  Surgeon: Nelva Bush, MD;  Location: [place] CV [facility];  Service: Cardiovascular;  Laterality: N/A;  . SPINE SURGERY    . TONSILLECTOMY      Social History   Tobacco Use  Smoking Status Former Smoker  . Packs/day: 1.25  . Years: 35.00  . Pack years: 43.75  . Types: Cigarettes  . Quit date: 02/28/2005  . Years since quitting: 14.6  Smokeless Tobacco Current User  . Types: Snuff  Tobacco Comment   occaisionally    Social History   Substance and Sexual Activity  Alcohol Use Yes  . Alcohol/week: 8.0 - 10.0 standard drinks  . Types: 8 -  10 Glasses of wine per week     Allergies  Allergen Reactions  . Brilinta [Ticagrelor] Shortness Of Breath  . Zetia [Ezetimibe] Other (See Comments)    Muscle aches  . Statins Other (See Comments)    Failed Crestor 5 mg twice weekly, Crestor 20 mg daily, Pravastatin 40 mg qd, Lipitor, Zocor - muscle aches    Current Facility-Administered Medications  Medication Dose Route Frequency Provider Last Rate Last Admin  . 0.9 %  sodium chloride infusion  250 mL Intravenous PRN Marykay Lex, MD      . acetaminophen (TYLENOL) tablet 650 mg  650 mg Oral Q4H PRN Marykay Lex, MD      . aspirin EC tablet 81 mg  81 mg Oral Daily Jonne Ply W, MD      . enoxaparin (LOVENOX) injection 40 mg  40 mg Subcutaneous Q24H Marykay Lex, MD   40 mg at 11/05/19 2258  . furosemide (LASIX) injection 20 mg  20 mg Intravenous BID End, Harrell Gave, MD      . hydrALAZINE (APRESOLINE) injection 10 mg  10 mg Intravenous Q20 Min PRN Marykay Lex, MD      . labetalol (NORMODYNE) injection 10 mg  10 mg Intravenous Q10 min PRN Marykay Lex, MD      . losartan (COZAAR)  tablet 50 mg  50 mg Oral Daily End, Christopher, MD      . metolazone (ZAROXOLYN) tablet 5 mg  5 mg Oral Once [place] Gum, Collier Salina, MD      . metoprolol succinate (TOPROL-XL) 24 hr tablet 25 mg  25 mg Oral Daily Marykay Lex, MD      . mupirocin ointment (BACTROBAN) 2 % 1 application  1 application Nasal BID Donato Heinz, MD      . ondansetron [facility]) injection 4 mg  4 mg Intravenous Q6H PRN Marykay Lex, MD      . pantoprazole (PROTONIX) EC tablet 40 mg  40 mg Oral Daily Marykay Lex, MD      . pneumococcal 23 valent vaccine (PNEUMOVAX-23) injection 0.5 mL  0.5 mL Intramuscular Tomorrow-1000 Donato Heinz, MD      . sodium chloride flush (NS) 0.9 % injection 3 mL  3 mL Intravenous Q12H Marykay Lex, MD   3 mL at 11/05/19 2258  . sodium chloride flush (NS) 0.9 % injection 3 mL  3 mL Intravenous PRN Marykay Lex, MD        Medications Prior to Admission  Medication Sig Dispense Refill Last Dose  . acetaminophen (TYLENOL) 500 MG tablet Take 1,000 mg by mouth every 6 (six) hours as needed for mild pain.   unk  . amLODipine (NORVASC) 5 MG tablet TAKE 1 TABLET BY MOUTH EVERY DAY (Patient taking differently: Take 5 mg by mouth daily. ) 90 tablet 3 11/05/2019 at Unknown time  . aspirin EC 81 MG tablet Take 1 tablet (81 mg total) by mouth daily. 90 tablet 3 11/05/2019 at Unknown time  . clopidogrel (PLAVIX) 75 MG tablet TAKE ONE TABLET BY MOUTH DAILY. (Patient taking differently: Take 75 mg by mouth daily. ) 90 tablet 3 11/05/2019 at Unknown time  . Evolocumab (REPATHA SURECLICK) 789 MG/ML SOAJ Inject 1 pen into the skin every 14 (fourteen) days. 2.1 mL 1 10/30/2019  . lisinopril (ZESTRIL) 20 MG tablet TAKE 1 TABLET BY MOUTH EVERY DAY (Patient taking differently: Take 20 mg by mouth daily. ) 90 tablet 3 11/05/2019 at Unknown  time  . meloxicam (MOBIC) 7.5 MG tablet Take 1 tablet (7.5 mg) by mouth once daily as needed   unk  . metoprolol succinate (TOPROL-XL) 25 MG 24 hr tablet TAKE 1 TABLET  BY MOUTH EVERY DAY (Patient taking differently: Take 25 mg by mouth daily. ) 90 tablet 3 11/05/2019 at 0600  . naproxen sodium (ALEVE) 220 MG tablet Take 220 mg by mouth as needed (pain).   unk  . nitroGLYCERIN (NITROSTAT) 0.4 MG SL tablet Place 1 tablet (0.4 mg total) under the tongue every 5 (five) minutes as needed for chest pain. 25 tablet 2 unk  . pantoprazole (PROTONIX) 40 MG tablet Take 1 tablet (40 mg total) by mouth daily. 90 tablet 1 11/05/2019 at Unknown time  . triamcinolone cream (KENALOG) 0.1 % Apply 1 application topically 2 (two) times daily as needed for itching.   unk  . cyclobenzaprine (FLEXERIL) 10 MG tablet Take 1 tablet (10 mg total) by mouth 3 (three) times daily as needed for muscle spasms. (Patient not taking: Reported on 11/06/2019) 60 tablet 5 Not Taking at Unknown time  . furosemide (LASIX) 20 MG tablet Take 1 tablet (20 mg) by mouth once daily x 3 days, then take 1 tablet daily as directed (Patient not taking: Reported on 10/28/2019) 30 tablet 1 Not Taking at Unknown time    Family History  Problem Relation Age of Onset  . Heart attack Mother   . Coronary artery disease Mother   . Heart disease Mother        Carotid Stenosis and BPG and Heart Disease before age 32  . Diabetes Mother   . Hypertension Mother   . Heart attack Father   . Heart disease Father        BPG and Heart Disease before age 17  . Hypertension Father   . Cancer Father 55       throat  . Stroke Father   . Colon cancer Neg Hx   . Colon polyps Neg Hx   . Esophageal cancer Neg Hx   . Rectal cancer Neg Hx   . Stomach cancer Neg Hx      Review of Systems:   ROS Right-hand-dominant No history of thoracic trauma rib fracture or pneumothorax Weight has slightly increased as his exercise tolerance is reduced No active dental complaints    Cardiac Review of Systems: Y or  [    ]= no  Chest Pain [chest tightness   ]  Resting SOB [   ] Exertional SOB  Blue.Reese  ]  Orthopnea [  ]   Pedal Edema [   ]      Palpitations [  ] Syncope  [  ]   Presyncope [   ]  General Review of Systems: [Y] = yes [  ]=no Constitional: recent weight change [ y ]; anorexia [  ]; fatigue Blue.Reese  ]; nausea [  ]; night sweats [  ]; fever [  ]; or chills [  ]                                                               Dental: Last Dentist visit:   Eye : blurred vision [  ]; diplopia [   ]; vision changes [  ];  Amaurosis fugax[  ]; Resp: cough [  ];  wheezing[  ];  hemoptysis[  ]; shortness of breath[ y ]; paroxysmal nocturnal dyspnea[  ]; dyspnea on exertion[y  ]; or orthopnea[  ];  GI:  gallstones[  ], vomiting[  ];  dysphagia[  ]; melena[  ];  hematochezia [  ]; heartburn[  ];   Hx of  Colonoscopy[ y ]; GU: kidney stones [  ]; hematuria[  ];   dysuria [  ];  nocturia[  ];  history of     obstruction [  ]; urinary frequency [  ]             Skin: rash, swelling[  ];, hair loss[  ];  peripheral edema[  ];  or itching[  ]; Musculosketetal: myalgias[  ];  joint swelling[  ];  joint erythema[  ];  joint pain[  ];  back pain[  ];  Heme/Lymph: bruising[  ];  bleeding[  ];  anemia[  ];  Neuro: TIA[  ];  headaches[  ];  stroke[  ];  vertigo[  ];  seizures[  ];   paresthesias[  ];  difficulty walking[  ];  Psych:depression[  ]; anxiety[  ];  Endocrine: diabetes[ y ];  thyroid dysfunction[  ];                       Physical Exam: BP 112/71 (BP Location: Left Arm)   Pulse 67   Temp 97.7 F (36.5 C) (Oral)   Resp 11   Ht 5\' 6"  (1.676 m)   Wt 79.3 kg   SpO2 100%   BMI 28.22 kg/m       Exam    General- alert and comfortable.  Right radial artery dressing intact without hematoma    Neck- no JVD, no cervical adenopathy palpable, no carotid bruit   Lungs- clear without rales, wheezes   Cor- regular rate and rhythm, no murmur , gallop   Abdomen- soft, non-tender   Extremities - warm, non-tender, minimal edema   Neuro- oriented, appropriate, no focal weakness     Diagnostic Studies & Laboratory data:     Recent  Radiology Findings:   CARDIAC CATHETERIZATION  Result Date: 11/05/2019 Conclusions: 1. Multivessel CAD, including calcified 80% distal LMCA stenosis, small diffusely diseased distal LAD, chronically occluded OM1, and 50% mid RCA lesion. 2. Widely patent mid LCx stent. 3. Moderately elevated left heart filling pressure. 4. Mildly to moderately elevated right heart filling pressure. 5. Normal to mildly reduced cardiac output/index. Recommendations: 1. Transfer to [facility] for cardiac surgery consultation for CABG, given severe LMCA disease and low LVEF. 2. Hold clopidogrel pending cardiac surgery consultation. 3. Obtain echocardiogram. 4. Aggressive secondary prevention. Nelva Bush, MD [facility]     I have independently reviewed the above radiologic studies and discussed with the patient   Recent Lab Findings: Lab Results  Component Value Date   WBC 6.2 10/24/2019   HGB 15.9 10/24/2019   HCT 45.6 10/24/2019   PLT 184 10/24/2019   GLUCOSE 164 (H) 11/05/2019   CHOL 154 10/24/2019   TRIG 120 10/24/2019   HDL 61 10/24/2019   LDLCALC 69 10/24/2019   ALT 29 05/21/2019   AST 24 05/21/2019   NA 138 11/05/2019   K 3.7 11/05/2019   CL 104 11/05/2019   CREATININE 0.91 11/05/2019   BUN 17 11/05/2019   CO2 25 11/05/2019   TSH 1.420 09/06/2017   INR 1.00 11/24/2017  HGBA1C 5.6 09/06/2017      Assessment / Plan:   Ischemic cardiomyopathy, severe left main stenosis with diabetic pattern three-vessel CAD Type 2 diabetes mellitus Peripheral vascular disease history of right carotid endarterectomy and left SFA stent 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  He will need an echocardiogram CT scan of the chest and pulmonary evaluation prior to surgery.  He will need to have his platelet function checked to determine  when the level of Plavix platelet inhibition will be safe to proceed.  Plan tentatively for CABG on Friday, September 10.      @ME1 @ 11/06/2019 9:40 AM

## 2019-11-06 NOTE — Progress Notes (Signed)
Progress Note  Patient Name: [name] Date of Encounter: 11/06/2019  [facility] Cardiologist: Nelva Bush, MD   Subjective   Mr. Deviney was admitted in transfer from [facility] yesterday with chronic catheterization by Dr. Saunders Revel revealing left main disease.  He was transferred for T CTS evaluation for CABG.  He currently denies chest pain or shortness of breath.  Inpatient Medications    Scheduled Meds: . aspirin EC  81 mg Oral Daily  . enoxaparin (LOVENOX) injection  40 mg Subcutaneous Q24H  . furosemide  20 mg Intravenous BID  . losartan  50 mg Oral Daily  . metolazone  5 mg Oral Once  . metoprolol succinate  25 mg Oral Daily  . mupirocin ointment  1 application Nasal BID  . pantoprazole  40 mg Oral Daily  . pneumococcal 23 valent vaccine  0.5 mL Intramuscular Tomorrow-1000  . sodium chloride flush  3 mL Intravenous Q12H   Continuous Infusions: . sodium chloride     PRN Meds: sodium chloride, acetaminophen, hydrALAZINE, labetalol, ondansetron (ZOFRAN) IV, sodium chloride flush   Vital Signs    Vitals:   11/05/19 2344 11/06/19 0353 11/06/19 0500 11/06/19 0801  BP: 118/76 104/61  112/71  Pulse: 61 67    Resp: 19 15  11   Temp: 98 F (36.7 C) 97.9 F (36.6 C)  97.7 F (36.5 C)  TempSrc: Oral Oral  Oral  SpO2: 98% 100%    Weight:   79.3 kg   Height:        Intake/Output Summary (Last 24 hours) at 11/06/2019 0959 Last data filed at 11/06/2019 0815 Gross per 24 hour  Intake --  Output 625 ml  Net -625 ml   Last 3 Weights 11/06/2019 11/05/2019 11/05/2019  Weight (lbs) 174 lb 13.2 oz 175 lb 9.6 oz 175 lb  Weight (kg) 79.3 kg 79.652 kg 79.379 kg      Telemetry    Sinus rhythm with PVCs- Personally Reviewed  ECG    Not performed today- Personally Reviewed  Physical Exam   GEN: No acute distress.   Neck: No JVD Cardiac: RRR, no murmurs, rubs, or gallops.  Respiratory: Clear to auscultation bilaterally. GI: Soft, nontender, non-distended  MS: No edema; No  deformity. Neuro:  Nonfocal  Psych: Normal affect   Labs    High Sensitivity Troponin:  No results for input(s): TROPONINIHS in the last 720 hours.    Chemistry Recent Labs  Lab 11/05/19 2227  NA 138  K 3.7  CL 104  CO2 25  GLUCOSE 164*  BUN 17  CREATININE 0.91  CALCIUM 8.7*  GFRNONAA >60  GFRAA >60  ANIONGAP 9     HematologyNo results for input(s): WBC, RBC, HGB, HCT, MCV, MCH, MCHC, RDW, PLT in the last 168 hours.  BNP Recent Labs  Lab 11/05/19 2228  BNP 468.7*     DDimer No results for input(s): DDIMER in the last 168 hours.   Radiology    CARDIAC CATHETERIZATION  Result Date: 11/05/2019 Conclusions: 1. Multivessel CAD, including calcified 80% distal LMCA stenosis, small diffusely diseased distal LAD, chronically occluded OM1, and 50% mid RCA lesion. 2. Widely patent mid LCx stent. 3. Moderately elevated left heart filling pressure. 4. Mildly to moderately elevated right heart filling pressure. 5. Normal to mildly reduced cardiac output/index. Recommendations: 1. Transfer to [facility] for cardiac surgery consultation for CABG, given severe LMCA disease and low LVEF. 2. Hold clopidogrel pending cardiac surgery consultation. 3. Obtain echocardiogram. 4. Aggressive secondary  prevention. Nelva Bush, MD [facility]    Cardiac Studies   Chronic catheterization (11/05/2019)  Conclusion  Conclusions: 1. Multivessel CAD, including calcified 80% distal LMCA stenosis, small diffusely diseased distal LAD, chronically occluded OM1, and 50% mid RCA lesion. 2. Widely patent mid LCx stent. 3. Moderately elevated left heart filling pressure. 4. Mildly to moderately elevated right heart filling pressure. 5. Normal to mildly reduced cardiac output/index.  Recommendations: 1. Transfer to [facility] for cardiac surgery consultation for CABG, given severe LMCA disease and low LVEF. 2. Hold clopidogrel pending cardiac surgery consultation. 3. Obtain  echocardiogram. 4. Aggressive secondary prevention.  Nelva Bush, MD [facility]   Intervention  Implants     Patient Profile     [name] is a 69yo male with history of CAD with NSTEMI 07/2015 s/p PCI to LCx and R/LHC (11/05/19 with mRCA 50%, dLM to ost LAD 80%, 1st marginal 100%s),known LBBB,NICM with EF 40-45% on 01/2018 echo, PAD s/p mechanical thrombectomy and catheter-directed thrombolysis to left SFA 05/2018, carotid artery stenosis, HTN, HLD, hiatal hernia,previous tobacco use,and PUD  Assessment & Plan    1: Coronary artery disease-history of prior circumflex stent with chronic catheterization performed yesterday showing severe left main disease distally just before the bifurcation.  Circumflex stent was patent and there was mild to moderate mid RCA stenosis.  He did take Plavix yesterday.  He was seen by Dr. Darcey Nora today who is tentatively scheduled him for CABG on Friday.  2: Ischemic cardiomyopathy-prior echo showed EF 45 to 50% although LV function at the time of angiography showed EF in the 25 to 30% range.  I suspect this is ischemically mediated.  I suspect this will improve with revascularization.  3: Essential hypertension-blood pressure well controlled on lisinopril, amlodipine and metoprolol as an outpatient.  4: Hyperlipidemia-on Repatha followed by Dr. Rockey Situ  For questions or updates, please contact [place] Please consult [email or web address] for contact info under        Signed, Quay Burow, MD  11/06/2019, 9:59 AM

## 2019-11-06 NOTE — Progress Notes (Signed)
  Echocardiogram 2D Echocardiogram has been performed.  [name] 11/06/2019, 4:14 PM

## 2019-11-06 NOTE — Plan of Care (Signed)
  Problem: Education: Goal: Ability to demonstrate management of disease process will improve Outcome: Progressing Goal: Ability to verbalize understanding of medication therapies will improve Outcome: Progressing   Problem: Activity: Goal: Capacity to carry out activities will improve Outcome: Progressing   Problem: Cardiac: Goal: Ability to achieve and maintain adequate cardiopulmonary perfusion will improve Outcome: Progressing   

## 2019-11-06 NOTE — Discharge Summary (Signed)
Discharge Summary    Patient ID: [name] MRN: [id number]; DOB: 01-05-1951  Admit date: 11/05/2019 Discharge date: 11/06/2019  Primary Care Provider: Glean Hess, MD  Primary Cardiologist: Nelva Bush, MD  Primary Electrophysiologist:  None   Discharge Diagnoses    Principal Problem:   CAD, multiple vessel Active Problems:   Accelerating angina [facility])    Diagnostic Studies/Procedures    LHC (10/05/2019): 1. Multivessel CAD, including calcified 80% distal LMCA stenosis, small diffusely diseased distal LAD, chronically occluded OM1, and 50% mid RCA lesion. 2. Widely patent mid LCx stent. 3. Moderately elevated left heart filling pressure. 4. Mildly to moderately elevated right heart filling pressure. 5. Normal to mildly reduced cardiac output/index.  _____________   History of Present Illness     [name] is a 69 y.o. male with history of CAD status post remote PCI to the LCx in the setting of NSTEMI, nonischemic cardiomyopathy with LVEF of 40-45% by echo in 2019, PAD, hypertension, hyperlipidemia, and prior tobacco use, who presented for outpatient catheterization for evaluation of worsening dyspnea on exertion and abnormal EKG.  Catheterization showed severe distal LMCA disease as well as diffuse disease of the distal LAD.  There is also at least moderate mid RCA stenosis.  Left heart filling pressures were moderately elevated.  LVEF was severely reduced at 30-35%.  Given these findings, the patient will be transferred to [facility] for optimization of his heart failure and surgical consultation for CABG.  Hospital Course     Consultants: None  Patient remained stable post catheterization with findings as outlined above.  Risks and benefits of transfer to [facility] have been discussed, which Mr. Pelcher is in agreement with.  He will be continued on aspirin.  Plavix has been held to allow for washout in an dissipation of cardiac surgery.  Echo and noninvasive  peripheral vascular studies to be performed when Mr. Moist has arrived at [facility].  _____________  Discharge Vitals Blood pressure 127/73, pulse 65, temperature 98.4 F (36.9 C), resp. rate 18, height 5\' 6"  (1.676 m), weight 79.4 kg, SpO2 96 %.  Filed Weights   11/05/19 0904  Weight: 79.4 kg    Labs & Radiologic Studies    CBC No results for input(s): WBC, NEUTROABS, HGB, HCT, MCV, PLT in the last 72 hours. Basic Metabolic Panel Recent Labs    11/05/19 2227  NA 138  K 3.7  CL 104  CO2 25  GLUCOSE 164*  BUN 17  CREATININE 0.91  CALCIUM 8.7*  MG 1.6*   Liver Function Tests No results for input(s): AST, ALT, ALKPHOS, BILITOT, PROT, ALBUMIN in the last 72 hours. No results for input(s): LIPASE, AMYLASE in the last 72 hours. High Sensitivity Troponin:   No results for input(s): TROPONINIHS in the last 720 hours.  BNP Invalid input(s): POCBNP D-Dimer No results for input(s): DDIMER in the last 72 hours. Hemoglobin A1C No results for input(s): HGBA1C in the last 72 hours. Fasting Lipid Panel No results for input(s): CHOL, HDL, LDLCALC, TRIG, CHOLHDL, LDLDIRECT in the last 72 hours. Thyroid Function Tests No results for input(s): TSH, T4TOTAL, T3FREE, THYROIDAB in the last 72 hours.  Invalid input(s): FREET3 _____________  CT CHEST W CONTRAST  Result Date: 11/06/2019 CLINICAL DATA:  Exertional shortness of breath. Follow-up of pulmonary nodule. Diabetic ex-smoker. EXAM: CT CHEST WITH CONTRAST TECHNIQUE: Multidetector CT imaging of the chest was performed during intravenous contrast administration. CONTRAST:  63mL OMNIPAQUE IOHEXOL 300 MG/ML  SOLN COMPARISON:  12/11/2018 screening CT. FINDINGS: Cardiovascular: Aortic atherosclerosis. Borderline cardiomegaly. Multivessel coronary artery atherosclerosis. No central pulmonary embolism, on this non-dedicated study. Mediastinum/Nodes: No supraclavicular adenopathy. A node within the azygoesophageal recess measures 11 mm on 68/3  and is enlarged from 6 mm on the prior. Right infrahilar adenopathy at 1.6 cm on 74/3, new. Right hilar borderline adenopathy at 1.4 cm on 61/3, new. Lungs/Pleura: No pleural fluid.  Mild centrilobular emphysema. New dependent right lower lobe consolidation, including on 89/4. The nodule along the right major fissure is likely a subpleural lymph node and measures on the order of 6 mm on 87/4, similar. Upper Abdomen: Hepatic steatosis. Normal imaged portions of the spleen, stomach, pancreas, gallbladder, adrenal glands, kidneys. Musculoskeletal: No acute osseous abnormality. IMPRESSION: 1. The perifissural right-sided pulmonary nodule is similar, and can be presumed a subpleural lymph node. 2. New right lower lobe consolidation, suspicious for pneumonia. 3. Mild right-sided thoracic adenopathy, likely reactive and secondary to pneumonia. 4. Aortic atherosclerosis (ICD10-I70.0), coronary artery atherosclerosis and emphysema (ICD10-J43.9). 5. Hepatic steatosis. Insert PRA call report Electronically Signed   By: Abigail Miyamoto M.D.   On: 11/06/2019 12:23   CARDIAC CATHETERIZATION  Result Date: 11/05/2019 Conclusions: 1. Multivessel CAD, including calcified 80% distal LMCA stenosis, small diffusely diseased distal LAD, chronically occluded OM1, and 50% mid RCA lesion. 2. Widely patent mid LCx stent. 3. Moderately elevated left heart filling pressure. 4. Mildly to moderately elevated right heart filling pressure. 5. Normal to mildly reduced cardiac output/index. Recommendations: 1. Transfer to [facility] for cardiac surgery consultation for CABG, given severe LMCA disease and low LVEF. 2. Hold clopidogrel pending cardiac surgery consultation. 3. Obtain echocardiogram. 4. Aggressive secondary prevention. Nelva Bush, MD [facility]   Disposition   Pt is being discharged home today in good condition.  Follow-up Plans & Appointments       Discharge Medications   Allergies as of 11/05/2019      Reactions     Brilinta [ticagrelor] Shortness Of Breath   Zetia [ezetimibe] Other (See Comments)   Muscle aches   Statins Other (See Comments)   Failed Crestor 5 mg twice weekly, Crestor 20 mg daily, Pravastatin 40 mg qd, Lipitor, Zocor - muscle aches      Medication List    ASK your doctor about these medications   amLODipine 5 MG tablet Commonly known as: NORVASC TAKE 1 TABLET BY MOUTH EVERY DAY   aspirin EC 81 MG tablet Take 1 tablet (81 mg total) by mouth daily.   clopidogrel 75 MG tablet Commonly known as: PLAVIX TAKE ONE TABLET BY MOUTH DAILY.   cyclobenzaprine 10 MG tablet Commonly known as: FLEXERIL Take 1 tablet (10 mg total) by mouth 3 (three) times daily as needed for muscle spasms.   furosemide 20 MG tablet Commonly known as: LASIX Take 1 tablet (20 mg) by mouth once daily x 3 days, then take 1 tablet daily as directed   lisinopril 20 MG tablet Commonly known as: ZESTRIL TAKE 1 TABLET BY MOUTH EVERY DAY   meloxicam 7.5 MG tablet Commonly known as: MOBIC Take 1 tablet (7.5 mg) by mouth once daily as needed   metoprolol succinate 25 MG 24 hr tablet Commonly known as: TOPROL-XL TAKE 1 TABLET BY MOUTH EVERY DAY   nitroGLYCERIN 0.4 MG SL tablet Commonly known as: Nitrostat Place 1 tablet (0.4 mg total) under the tongue every 5 (five) minutes as needed for chest pain.   pantoprazole 40 MG tablet Commonly known as: PROTONIX Take 1 tablet (  40 mg total) by mouth daily.   Repatha SureClick 448 MG/ML Soaj Generic drug: Evolocumab Inject 1 pen into the skin every 14 (fourteen) days.   triamcinolone cream 0.1 % Commonly known as: KENALOG Apply 1 application topically 2 (two) times daily as needed for itching.          Outstanding Labs/Studies   None  Duration of Discharge Encounter   Greater than 30 minutes including physician time.  Signed, Nelva Bush, MD 11/06/2019, 12:56 PM

## 2019-11-07 ENCOUNTER — Inpatient Hospital Stay (HOSPITAL_COMMUNITY): Payer: PPO

## 2019-11-07 DIAGNOSIS — I25110 Atherosclerotic heart disease of native coronary artery with unstable angina pectoris: Secondary | ICD-10-CM

## 2019-11-07 DIAGNOSIS — I509 Heart failure, unspecified: Secondary | ICD-10-CM

## 2019-11-07 DIAGNOSIS — I2699 Other pulmonary embolism without acute cor pulmonale: Secondary | ICD-10-CM

## 2019-11-07 LAB — COMPREHENSIVE METABOLIC PANEL
ALT: 22 U/L (ref 0–44)
AST: 17 U/L (ref 15–41)
Albumin: 3.9 g/dL (ref 3.5–5.0)
Alkaline Phosphatase: 75 U/L (ref 38–126)
Anion gap: 12 (ref 5–15)
BUN: 22 mg/dL (ref 8–23)
CO2: 26 mmol/L (ref 22–32)
Calcium: 9.7 mg/dL (ref 8.9–10.3)
Chloride: 99 mmol/L (ref 98–111)
Creatinine, Ser: 0.98 mg/dL (ref 0.61–1.24)
GFR calc Af Amer: >60 mL/min (ref >60)
GFR calc non Af Amer: >60 mL/min (ref >60)
Glucose, Bld: 124 mg/dL — ABNORMAL HIGH (ref 70–99)
Potassium: 4.3 mmol/L (ref 3.5–5.1)
Sodium: 137 mmol/L (ref 135–145)
Total Bilirubin: 0.6 mg/dL (ref 0.3–1.2)
Total Protein: 7.4 g/dL (ref 6.5–8.1)

## 2019-11-07 LAB — HEPARIN LEVEL (UNFRACTIONATED): Heparin Unfractionated: 0.17 IU/mL — ABNORMAL LOW (ref 0.30–0.70)

## 2019-11-07 LAB — PROTIME-INR
INR: 1 (ref 0.8–1.2)
Prothrombin Time: 12.7 seconds (ref 11.4–15.2)

## 2019-11-07 LAB — TSH: TSH: 3.303 u[IU]/mL (ref 0.350–4.500)

## 2019-11-07 LAB — PLATELET INHIBITION P2Y12
Platelet Function  P2Y12: 121 [PRU] — ABNORMAL LOW (ref 182–335)
Platelet Function  P2Y12: 121 [PRU] — ABNORMAL LOW (ref 182–335)
Platelet Function  P2Y12: 106 [PRU] — ABNORMAL LOW (ref 182–335)
Platelet Function Baseline: 121 [PRU] — ABNORMAL LOW (ref 194–418)

## 2019-11-07 LAB — HEMOGLOBIN A1C
Hgb A1c MFr Bld: 5.6 % (ref 4.8–5.6)
Mean Plasma Glucose: 114.02 mg/dL

## 2019-11-07 MED ORDER — EPINEPHRINE HCL 5 MG/250ML IV SOLN IN NS
0.0000 ug/min | INTRAVENOUS | Status: DC
  Filled 2019-11-07: qty 250

## 2019-11-07 MED ORDER — SODIUM CHLORIDE 0.9 % IV SOLN
750.0000 mg | INTRAVENOUS | Status: DC
  Filled 2019-11-07: qty 750

## 2019-11-07 MED ORDER — NOREPINEPHRINE 4 MG/250ML-% IV SOLN
0.0000 ug/min | INTRAVENOUS | Status: DC
  Filled 2019-11-07: qty 250

## 2019-11-07 MED ORDER — INSULIN REGULAR(HUMAN) IN NACL 100-0.9 UT/100ML-% IV SOLN
INTRAVENOUS | Status: DC
  Filled 2019-11-07: qty 100

## 2019-11-07 MED ORDER — POTASSIUM CHLORIDE 2 MEQ/ML IV SOLN
80.0000 meq | INTRAVENOUS | Status: DC
  Filled 2019-11-07: qty 40

## 2019-11-07 MED ORDER — TRANEXAMIC ACID (OHS) BOLUS VIA INFUSION: 15.0000 mg/kg | INTRAVENOUS | Status: DC

## 2019-11-07 MED ORDER — SODIUM CHLORIDE 0.9 % IV SOLN
INTRAVENOUS | Status: DC
  Filled 2019-11-07: qty 30

## 2019-11-07 MED ORDER — SODIUM CHLORIDE 0.9 % IV SOLN
1.5000 g | INTRAVENOUS | Status: DC
  Filled 2019-11-07: qty 1.5

## 2019-11-07 MED ORDER — HEPARIN (PORCINE) 25000 UT/250ML-% IV SOLN
1450.0000 [IU]/h | INTRAVENOUS | Status: DC
  Administered 2019-11-07: 1000 [IU]/h via INTRAVENOUS
  Administered 2019-11-08: 1300 [IU]/h via INTRAVENOUS
  Administered 2019-11-09 – 2019-11-10 (×3): 1450 [IU]/h via INTRAVENOUS
  Filled 2019-11-07 (×5): qty 250

## 2019-11-07 MED ORDER — DEXMEDETOMIDINE HCL IN NACL 400 MCG/100ML IV SOLN
0.1000 ug/kg/h | INTRAVENOUS | Status: DC
  Filled 2019-11-07: qty 100

## 2019-11-07 MED ORDER — NITROGLYCERIN IN D5W 200-5 MCG/ML-% IV SOLN
2.0000 ug/min | INTRAVENOUS | Status: DC
  Filled 2019-11-07: qty 250

## 2019-11-07 MED ORDER — PLASMA-LYTE 148 IV SOLN
INTRAVENOUS | Status: DC
  Filled 2019-11-07: qty 2.5

## 2019-11-07 MED ORDER — MAGNESIUM SULFATE 50 % IJ SOLN
40.0000 meq | INTRAMUSCULAR | Status: DC
  Filled 2019-11-07: qty 9.85

## 2019-11-07 MED ORDER — VANCOMYCIN HCL 1250 MG/250ML IV SOLN
1250.0000 mg | INTRAVENOUS | Status: DC
  Filled 2019-11-07: qty 250

## 2019-11-07 MED ORDER — MILRINONE LACTATE IN DEXTROSE 20-5 MG/100ML-% IV SOLN
0.3000 ug/kg/min | INTRAVENOUS | Status: DC
  Filled 2019-11-07: qty 100

## 2019-11-07 MED ORDER — TRANEXAMIC ACID (OHS) PUMP PRIME SOLUTION
2.0000 mg/kg | INTRAVENOUS | Status: DC
  Filled 2019-11-07: qty 1.54

## 2019-11-07 MED ORDER — TRANEXAMIC ACID 1000 MG/10ML IV SOLN
1.5000 mg/kg/h | INTRAVENOUS | Status: DC
  Filled 2019-11-07: qty 25

## 2019-11-07 MED ORDER — PHENYLEPHRINE HCL-NACL 20-0.9 MG/250ML-% IV SOLN
30.0000 ug/min | INTRAVENOUS | Status: DC
  Filled 2019-11-07: qty 250

## 2019-11-07 NOTE — Progress Notes (Signed)
Progress Note  Patient Name: KYROLLOS CORDELL Date of Encounter: 11/07/2019  [facility] Cardiologist: Nelva Bush, MD   Subjective   Mr. Keetch was admitted in transfer from [facility] with chronic catheterization by Dr. Saunders Revel revealing left main disease.  He was transferred for T CTS evaluation for CABG.  He currently denies chest pain or shortness of breath.  Inpatient Medications    Scheduled Meds: . aspirin EC  81 mg Oral Daily  . enoxaparin (LOVENOX) injection  40 mg Subcutaneous Q24H  . furosemide  20 mg Intravenous BID  . losartan  50 mg Oral Daily  . metoprolol succinate  25 mg Oral Daily  . mupirocin ointment  1 application Nasal BID  . pantoprazole  40 mg Oral Daily  . pneumococcal 23 valent vaccine  0.5 mL Intramuscular Tomorrow-1000  . sodium chloride flush  3 mL Intravenous Q12H   Continuous Infusions: . sodium chloride    . cefTRIAXone (ROCEPHIN)  IV Stopped (11/06/19 1826)   PRN Meds: sodium chloride, acetaminophen, hydrALAZINE, labetalol, ondansetron (ZOFRAN) IV, sodium chloride flush   Vital Signs    Vitals:   11/06/19 1930 11/06/19 2353 11/07/19 0415 11/07/19 0820  BP: 113/81  113/78 95/80  Pulse: 64 63 75 88  Resp: 16 12 16 15   Temp: 97.8 F (36.6 C) 97.7 F (36.5 C) 97.8 F (36.6 C) (!) 97.4 F (36.3 C)  TempSrc: Oral Oral Oral Oral  SpO2: 98% 98% 99%   Weight:   77.1 kg   Height:        Intake/Output Summary (Last 24 hours) at 11/07/2019 0839 Last data filed at 11/07/2019 0700 Gross per 24 hour  Intake 350 ml  Output 1800 ml  Net -1450 ml   Last 3 Weights 11/07/2019 11/06/2019 11/05/2019  Weight (lbs) 169 lb 15.6 oz 174 lb 13.2 oz 175 lb 9.6 oz  Weight (kg) 77.1 kg 79.3 kg 79.652 kg      Telemetry    Sinus rhythm with PVCs- Personally Reviewed  ECG    Not performed today- Personally Reviewed  Physical Exam   GEN: No acute distress.   Neck: No JVD Cardiac: RRR, no murmurs, rubs, or gallops.  Respiratory: Clear to auscultation  bilaterally. GI: Soft, nontender, non-distended  MS: No edema; No deformity. Neuro:  Nonfocal  Psych: Normal affect   Labs    High Sensitivity Troponin:  No results for input(s): TROPONINIHS in the last 720 hours.    Chemistry Recent Labs  Lab 11/05/19 2227 11/05/19 2227 11/06/19 1734 11/06/19 1813 11/07/19 0034  NA 138   < > 137 137 137  K 3.7   < > 3.9 3.9 4.3  CL 104  --   --  100 99  CO2 25  --   --  24 26  GLUCOSE 164*  --   --  150* 124*  BUN 17  --   --  20 22  CREATININE 0.91  --   --  1.06 0.98  CALCIUM 8.7*  --   --  9.4 9.7  PROT  --   --   --   --  7.4  ALBUMIN  --   --   --   --  3.9  AST  --   --   --   --  17  ALT  --   --   --   --  22  ALKPHOS  --   --   --   --  75  BILITOT  --   --   --   --  0.6  GFRNONAA >60  --   --  >60 >60  GFRAA >60  --   --  >60 >60  ANIONGAP 9  --   --  13 12   < > = values in this interval not displayed.     Hematology Recent Labs  Lab 11/06/19 1734 11/06/19 1813  WBC  --  5.8  RBC  --  4.77  HGB 15.0 14.7  HCT 44.0 44.5  MCV  --  93.3  MCH  --  30.8  MCHC  --  33.0  RDW  --  12.3  PLT  --  236    BNP Recent Labs  Lab 11/05/19 2228  BNP 468.7*     DDimer No results for input(s): DDIMER in the last 168 hours.   Radiology    CT CHEST W CONTRAST  Result Date: 11/06/2019 CLINICAL DATA:  Exertional shortness of breath. Follow-up of pulmonary nodule. Diabetic ex-smoker. EXAM: CT CHEST WITH CONTRAST TECHNIQUE: Multidetector CT imaging of the chest was performed during intravenous contrast administration. CONTRAST:  32mL OMNIPAQUE IOHEXOL 300 MG/ML  SOLN COMPARISON:  12/11/2018 screening CT. FINDINGS: Cardiovascular: Aortic atherosclerosis. Borderline cardiomegaly. Multivessel coronary artery atherosclerosis. No central pulmonary embolism, on this non-dedicated study. Mediastinum/Nodes: No supraclavicular adenopathy. A node within the azygoesophageal recess measures 11 mm on 68/3 and is enlarged from 6 mm on the  prior. Right infrahilar adenopathy at 1.6 cm on 74/3, new. Right hilar borderline adenopathy at 1.4 cm on 61/3, new. Lungs/Pleura: No pleural fluid.  Mild centrilobular emphysema. New dependent right lower lobe consolidation, including on 89/4. The nodule along the right major fissure is likely a subpleural lymph node and measures on the order of 6 mm on 87/4, similar. Upper Abdomen: Hepatic steatosis. Normal imaged portions of the spleen, stomach, pancreas, gallbladder, adrenal glands, kidneys. Musculoskeletal: No acute osseous abnormality. IMPRESSION: 1. The perifissural right-sided pulmonary nodule is similar, and can be presumed a subpleural lymph node. 2. New right lower lobe consolidation, suspicious for pneumonia. 3. Mild right-sided thoracic adenopathy, likely reactive and secondary to pneumonia. 4. Aortic atherosclerosis (ICD10-I70.0), coronary artery atherosclerosis and emphysema (ICD10-J43.9). 5. Hepatic steatosis. Insert PRA call report Electronically Signed   By: Abigail Miyamoto M.D.   On: 11/06/2019 12:23   CARDIAC CATHETERIZATION  Result Date: 11/05/2019 Conclusions: 1. Multivessel CAD, including calcified 80% distal LMCA stenosis, small diffusely diseased distal LAD, chronically occluded OM1, and 50% mid RCA lesion. 2. Widely patent mid LCx stent. 3. Moderately elevated left heart filling pressure. 4. Mildly to moderately elevated right heart filling pressure. 5. Normal to mildly reduced cardiac output/index. Recommendations: 1. Transfer to [facility] for cardiac surgery consultation for CABG, given severe LMCA disease and low LVEF. 2. Hold clopidogrel pending cardiac surgery consultation. 3. Obtain echocardiogram. 4. Aggressive secondary prevention. Nelva Bush, MD [facility]   ECHOCARDIOGRAM COMPLETE  Result Date: 11/06/2019    ECHOCARDIOGRAM REPORT   Patient Name:   TRICIA OAXACA Propst Date of Exam: 11/06/2019 Medical Rec #:  [id number]    Height:       66.0 in Accession #:    [id number]    Weight:       174.8 lb Date of Birth:  Jan 22, 1951   BSA:          1.889 m Patient Age:    42 years     BP:  123/75 mmHg Patient Gender: M            HR:           69 bpm. Exam Location:  Inpatient Procedure: 2D Echo Indications:    786.50 chest pain  History:        Patient has prior history of Echocardiogram examinations, most                 recent 02/27/2018. Previous Myocardial Infarction; Risk                 Factors:Hypertension and Dyslipidemia. Unstable angina.  Sonographer:    [name] RDCS (AE) Referring Phys: [id number] PETER VAN TRIGT  Sonographer Comments: No parasternal window, suboptimal subcostal window and suboptimal apical window. IMPRESSIONS  1. Poor quality images no parasternal/subcostal images.  2. Septal apical and inferior basal akinesis hypokinesis of mid and apical inferior wall. Left ventricular ejection fraction, by estimation, is 25 to 30%. The left ventricle has severely decreased function. The left ventricle demonstrates regional wall motion abnormalities (see scoring diagram/findings for description). The left ventricular internal cavity size was severely dilated. Left ventricular diastolic parameters were normal.  3. Right ventricular systolic function is normal. The right ventricular size is normal.  4. The mitral valve is normal in structure. Mild mitral valve regurgitation. No evidence of mitral stenosis.  5. The aortic valve was not well visualized. Aortic valve regurgitation is not visualized. Mild aortic valve sclerosis is present, with no evidence of aortic valve stenosis.  6. The inferior vena cava is normal in size with greater than 50% respiratory variability, suggesting right atrial pressure of 3 mmHg. FINDINGS  Left Ventricle: Septal apical and inferior basal akinesis hypokinesis of mid and apical inferior wall. Left ventricular ejection fraction, by estimation, is 25 to 30%. The left ventricle has severely decreased function. The left ventricle demonstrates  regional wall motion abnormalities. The left ventricular internal cavity size was severely dilated. There is no left ventricular hypertrophy. Left ventricular diastolic parameters were normal. Right Ventricle: The right ventricular size is normal. No increase in right ventricular wall thickness. Right ventricular systolic function is normal. Left Atrium: Left atrial size was normal in size. Right Atrium: Right atrial size was normal in size. Pericardium: There is no evidence of pericardial effusion. Mitral Valve: The mitral valve is normal in structure. There is moderate thickening of the mitral valve leaflet(s). There is mild calcification of the mitral valve leaflet(s). Mild mitral valve regurgitation. No evidence of mitral valve stenosis. Tricuspid Valve: The tricuspid valve is not well visualized. Tricuspid valve regurgitation is mild . No evidence of tricuspid stenosis. Aortic Valve: The aortic valve was not well visualized. Aortic valve regurgitation is not visualized. Mild aortic valve sclerosis is present, with no evidence of aortic valve stenosis. Pulmonic Valve: The pulmonic valve was not well visualized. Pulmonic valve regurgitation is not visualized. No evidence of pulmonic stenosis. Aorta: The aortic root is normal in size and structure. Venous: The inferior vena cava is normal in size with greater than 50% respiratory variability, suggesting right atrial pressure of 3 mmHg. IAS/Shunts: No atrial level shunt detected by color flow Doppler. Additional Comments: Poor quality images no parasternal/subcostal images.   Diastology LV e' medial:    4.13 cm/s LV E/e' medial:  16.0 LV e' lateral:   6.96 cm/s LV E/e' lateral: 9.5  RIGHT VENTRICLE RV S prime:     12.80 cm/s TAPSE (M-mode): 2.3 cm LEFT ATRIUM  Index LA Vol (A2C):   26.0 ml 13.77 ml/m LA Vol (A4C):   44.6 ml 23.61 ml/m LA Biplane Vol: 34.4 ml 18.21 ml/m  AORTIC VALVE LVOT Vmax:   88.80 cm/s LVOT Vmean:  55.300 cm/s LVOT VTI:    0.192 m  MITRAL VALVE MV Area (PHT): 2.37 cm    SHUNTS MV Decel Time: 320 msec    Systemic VTI: 0.19 m MV E velocity: 66.00 cm/s MV A velocity: 76.70 cm/s MV E/A ratio:  0.86 Jenkins Rouge MD Electronically signed by Jenkins Rouge MD Signature Date/Time: 11/06/2019/4:47:48 PM    Final     Cardiac Studies   Chronic catheterization (11/05/2019)  Conclusion  Conclusions: 1. Multivessel CAD, including calcified 80% distal LMCA stenosis, small diffusely diseased distal LAD, chronically occluded OM1, and 50% mid RCA lesion. 2. Widely patent mid LCx stent. 3. Moderately elevated left heart filling pressure. 4. Mildly to moderately elevated right heart filling pressure. 5. Normal to mildly reduced cardiac output/index.  Recommendations: 1. Transfer to [facility] for cardiac surgery consultation for CABG, given severe LMCA disease and low LVEF. 2. Hold clopidogrel pending cardiac surgery consultation. 3. Obtain echocardiogram. 4. Aggressive secondary prevention.  Nelva Bush, MD [place]   Intervention  Implants     Patient Profile     JLYNN LANGILLE is a 69yo male with history of CAD with NSTEMI 07/2015 s/p PCI to LCx and R/LHC (11/05/19 with mRCA 50%, dLM to ost LAD 80%, 1st marginal 100%s),known LBBB,NICM with EF 40-45% on 01/2018 echo, PAD s/p mechanical thrombectomy and catheter-directed thrombolysis to left SFA 05/2018, carotid artery stenosis, HTN, HLD, hiatal hernia,previous tobacco use,and PUD  Assessment & Plan    1: Coronary artery disease-history of prior circumflex stent with chronic catheterization performed yesterday showing severe left main disease distally just before the bifurcation.  Circumflex stent was patent and there was mild to moderate mid RCA stenosis.  He did take Plavix yesterday.  He was seen by Dr. Darcey Nora who is tentatively scheduled him for CABG on Friday.  Unfortunately, his P2 Y 12 platelet tests came back at 121 suggesting he still had significant Plavix  effect.  This will be rechecked today in anticipation of potential bypass surgery tomorrow.  2: Ischemic cardiomyopathy-prior echo showed EF 45 to 50% although LV function at the time of angiography showed EF in the 25 to 30% range.  I suspect this is ischemically mediated.  I suspect this will improve with revascularization.  He is on losartan, beta-blocker and diuretics.  His I/os -2 L.  He appears dry on exam.  3: Essential hypertension-blood pressure well controlled on lisinopril, amlodipine and metoprolol as an outpatient.  4: Hyperlipidemia-on Repatha followed by Dr. Rockey Situ  For questions or updates, please contact [place] Please consult [email or web address] for contact info under        Signed, Quay Burow, MD  11/07/2019, 8:39 AM

## 2019-11-07 NOTE — Progress Notes (Signed)
ANTICOAGULATION CONSULT NOTE - Initial Consult  Pharmacy Consult for Heparin  Indication: chest pain/ACS  Allergies  Allergen Reactions  . Brilinta [Ticagrelor] Shortness Of Breath  . Zetia [Ezetimibe] Other (See Comments)    Muscle aches  . Statins Other (See Comments)    Failed Crestor 5 mg twice weekly, Crestor 20 mg daily, Pravastatin 40 mg qd, Lipitor, Zocor - muscle aches    Patient Measurements: Height: 5\' 6"  (167.6 cm) Weight: 77.1 kg (169 lb 15.6 oz) IBW/kg (Calculated) : 63.8  Vital Signs: Temp: 97.8 F (36.6 C) (09/09 1043) Temp Source: Oral (09/09 1043) BP: 99/66 (09/09 1043) Pulse Rate: 74 (09/09 1043)  Labs: Recent Labs    11/05/19 2227 11/06/19 1734 11/06/19 1813 11/07/19 0034  HGB  --  15.0 14.7  --   HCT  --  44.0 44.5  --   PLT  --   --  236  --   LABPROT  --   --   --  12.7  INR  --   --   --  1.0  CREATININE 0.91  --  1.06 0.98    Estimated Creatinine Clearance: 69.5 mL/min (by C-G formula based on SCr of 0.98 mg/dL).   Assessment: Patient has CAD of left main. Waiting on CABG post Plavix washout. Last platelet function test was low, delaying CABG until 9/13, so pharmacy consulted for heparin. CBC and platelets within normal limits. Patient had no signs of bleeding per in-person encounter. Last enoxaparin dose 9/8 @2100 .  Goal of Therapy:  Heparin level 0.3-0.7 units/ml Monitor platelets by anticoagulation protocol: Yes   Plan:  Start heparin infusion at 1000 units/hr Check anti-Xa level in 6 hours and daily while on heparin Continue to monitor H&H and platelets  Norina Buzzard, PharmD PGY1 Pharmacy Resident 11/07/2019 3:17 PM

## 2019-11-07 NOTE — Progress Notes (Signed)
[place] for Heparin  Indication: chest pain/ACS  Allergies  Allergen Reactions  . Brilinta [Ticagrelor] Shortness Of Breath  . Zetia [Ezetimibe] Other (See Comments)    Muscle aches  . Statins Other (See Comments)    Failed Crestor 5 mg twice weekly, Crestor 20 mg daily, Pravastatin 40 mg qd, Lipitor, Zocor - muscle aches    Patient Measurements: Height: 5\' 6"  (167.6 cm) Weight: 77.1 kg (169 lb 15.6 oz) IBW/kg (Calculated) : 63.8  Heparin dosing weight: 77.1 kg  Vital Signs: Temp: 97.6 F (36.4 C) (09/09 1949) Temp Source: Oral (09/09 1949) BP: 106/74 (09/09 1949) Pulse Rate: 65 (09/09 1949)  Labs: Recent Labs    11/05/19 2227 11/06/19 1734 11/06/19 1813 11/07/19 0034 11/07/19 2116  HGB  --  15.0 14.7  --   --   HCT  --  44.0 44.5  --   --   PLT  --   --  236  --   --   LABPROT  --   --   --  12.7  --   INR  --   --   --  1.0  --   HEPARINUNFRC  --   --   --   --  0.17*  CREATININE 0.91  --  1.06 0.98  --     Estimated Creatinine Clearance: 69.5 mL/min (by C-G formula based on SCr of 0.98 mg/dL).   Assessment: 60 yom transferred from [facility] to [facility] with cath 9/8 showing left main disease, waiting on CABG post-Plavix washout. Last platelet function test was low, delaying CABG until 9/13. Pharmacy consulted to dose heparin.  Initial heparin level low at 0.17 (drawn ~5 hrs from heparin start). CBC wnl. No bleeding or issues with infusion per discussion with RN.  Goal of Therapy:  Heparin level 0.3-0.7 units/ml Monitor platelets by anticoagulation protocol: Yes   Plan:  No bolus. Increase heparin to 1150 units/hr Check 6hr heparin level Monitor daily heparin level and CBC, s/sx bleeding CABG planned 9/13   Arturo Morton, PharmD, BCPS Please check AMION for all [place] contact numbers Clinical Pharmacist 11/07/2019 10:07 PM

## 2019-11-07 NOTE — Progress Notes (Signed)
Procedure(s) (LRB): CORONARY ARTERY BYPASS GRAFTING (CABG) (N/A) TRANSESOPHAGEAL ECHOCARDIOGRAM (TEE) (N/A) Subjective: Diuresing well with CHF on presentation from ischemic cardiomyopathy Echo shows severe LV dysfunction, at time of surgery patient may need Impella mechanical cardiac support as discussed with patient Chest CT scan shows probable pneumonia in the medial basilar segment of the right lower lobe-IV antibiotics started Platelet assay shows severe depression of platelet function-P2 Y 01/29/2020.  We will repeat assay today and if platelet function still severely depressed he will need to wait a few days until surgery  Objective: Vital signs in last 24 hours: Temp:  [97.7 F (36.5 C)-98.1 F (36.7 C)] 97.8 F (36.6 C) (09/09 0415) Pulse Rate:  [60-75] 75 (09/09 0415) Cardiac Rhythm: Normal sinus rhythm (09/09 0415) Resp:  [11-19] 16 (09/09 0415) BP: (104-125)/(71-81) 113/78 (09/09 0415) SpO2:  [98 %-99 %] 99 % (09/09 0415) Weight:  [77.1 kg] 77.1 kg (09/09 0415)  Hemodynamic parameters for last 24 hours:    Intake/Output from previous day: 09/08 0701 - 09/09 0700 In: 110 [I.V.:10; IV Piggyback:100] Out: 2025 [Urine:2025] Intake/Output this shift: Total I/O In: 110 [I.V.:10; IV Piggyback:100] Out: 1400 [Urine:1400]  Alert and comfortable Lungs clear No murmur Sinus rhythm Neuro intact  Lab Results: Recent Labs    11/06/19 1734 11/06/19 1813  WBC  --  5.8  HGB 15.0 14.7  HCT 44.0 44.5  PLT  --  236   BMET:  Recent Labs    11/06/19 1813 11/07/19 0034  NA 137 137  K 3.9 4.3  CL 100 99  CO2 24 26  GLUCOSE 150* 124*  BUN 20 22  CREATININE 1.06 0.98  CALCIUM 9.4 9.7    PT/INR:  Recent Labs    11/07/19 0034  LABPROT 12.7  INR 1.0   ABG    Component Value Date/Time   PHART 7.473 (H) 11/06/2019 1734   HCO3 27.1 11/06/2019 1734   TCO2 28 11/06/2019 1734   O2SAT 95.0 11/06/2019 1734   CBG (last 3)  No results for input(s): GLUCAP in the  last 72 hours.  Assessment/Plan: S/P Procedure(s) (LRB): CORONARY ARTERY BYPASS GRAFTING (CABG) (N/A) TRANSESOPHAGEAL ECHOCARDIOGRAM (TEE) (N/A) Patient has been assessed for high risk CABG for ischemic cardiomyopathy three-vessel CAD and low EF.  Will wait for repeat P2 Y 12 assay before placing orders for surgery tomorrow.   LOS: 2 days    [name] 11/07/2019

## 2019-11-07 NOTE — Progress Notes (Addendum)
CARDIAC REHAB PHASE I   Pt has been ambulating independently. He denies sx. Sts he played golf on Monday before cath. Discussed IS (2200 mL), sternal precautions, mobility post op and d/c planning. Pt very receptive. Has watched preop video. Gave him materials to review. Encouraged him to tell staff if any angina sx and to not walk off floor. [place], ACSM 11/07/2019 11:48 AM

## 2019-11-07 NOTE — Progress Notes (Signed)
VASCULAR LAB    Pre CABG Dopplers have been performed.  See CV proc for preliminary results.   Araiya Tilmon, RVT 11/07/2019, 10:10 AM

## 2019-11-07 NOTE — Plan of Care (Signed)

## 2019-11-07 NOTE — Progress Notes (Signed)
      [address]       [place] [address]             [phone]      Due to patient's p2Y12 test being abnormal.  The patient's scheduled CABG procedure will be moved to Monday to allow further Plavix washout.  Patient remains chest pain free.  He has been notified and is agreeable to plan.   Ellwood Handler, PA-C

## 2019-11-08 ENCOUNTER — Inpatient Hospital Stay: Payer: Self-pay

## 2019-11-08 DIAGNOSIS — I5043 Acute on chronic combined systolic (congestive) and diastolic (congestive) heart failure: Secondary | ICD-10-CM

## 2019-11-08 DIAGNOSIS — I255 Ischemic cardiomyopathy: Secondary | ICD-10-CM

## 2019-11-08 LAB — HEPARIN LEVEL (UNFRACTIONATED)
Heparin Unfractionated: 0.25 IU/mL — ABNORMAL LOW (ref 0.30–0.70)
Heparin Unfractionated: 0.27 IU/mL — ABNORMAL LOW (ref 0.30–0.70)

## 2019-11-08 LAB — CBC
HCT: 49.4 % (ref 39.0–52.0)
Hemoglobin: 16.4 g/dL (ref 13.0–17.0)
MCH: 31.2 pg (ref 26.0–34.0)
MCHC: 33.2 g/dL (ref 30.0–36.0)
MCV: 93.9 fL (ref 80.0–100.0)
Platelets: 241 10*3/uL (ref 150–400)
RBC: 5.26 MIL/uL (ref 4.22–5.81)
RDW: 12.4 % (ref 11.5–15.5)
WBC: 6.8 10*3/uL (ref 4.0–10.5)
nRBC: 0 % (ref 0.0–0.2)

## 2019-11-08 LAB — BASIC METABOLIC PANEL
Anion gap: 10 (ref 5–15)
BUN: 30 mg/dL — ABNORMAL HIGH (ref 8–23)
CO2: 28 mmol/L (ref 22–32)
Calcium: 10 mg/dL (ref 8.9–10.3)
Chloride: 98 mmol/L (ref 98–111)
Creatinine, Ser: 1.23 mg/dL (ref 0.61–1.24)
GFR calc Af Amer: >60 mL/min (ref >60)
GFR calc non Af Amer: 60 mL/min — ABNORMAL LOW (ref >60)
Glucose, Bld: 111 mg/dL — ABNORMAL HIGH (ref 70–99)
Potassium: 4.7 mmol/L (ref 3.5–5.1)
Sodium: 136 mmol/L (ref 135–145)

## 2019-11-08 LAB — COOXEMETRY PANEL
Carboxyhemoglobin: 1.2 % (ref 0.5–1.5)
Methemoglobin: 1.2 % (ref 0.0–1.5)
O2 Saturation: 58.3 %
Total hemoglobin: 16.1 g/dL — ABNORMAL HIGH (ref 12.0–16.0)

## 2019-11-08 LAB — MAGNESIUM: Magnesium: 1.9 mg/dL (ref 1.7–2.4)

## 2019-11-08 MED ORDER — SODIUM CHLORIDE 0.9% FLUSH: 10.0000 mL | INTRAVENOUS | Status: DC | PRN

## 2019-11-08 MED ORDER — AMIODARONE HCL 200 MG PO TABS
200.0000 mg | ORAL_TABLET | Freq: Two times a day (BID) | ORAL | Status: DC
  Administered 2019-11-08 – 2019-11-11 (×6): 200 mg via ORAL
  Filled 2019-11-08 (×6): qty 1

## 2019-11-08 MED ORDER — SODIUM CHLORIDE 0.9% FLUSH
10.0000 mL | Freq: Two times a day (BID) | INTRAVENOUS | Status: DC
  Administered 2019-11-08: 10 mL
  Administered 2019-11-08: 20 mL
  Administered 2019-11-09 – 2019-11-10 (×3): 10 mL

## 2019-11-08 MED ORDER — CHLORHEXIDINE GLUCONATE CLOTH 2 % EX PADS
6.0000 | MEDICATED_PAD | Freq: Every day | CUTANEOUS | Status: DC
  Administered 2019-11-08 – 2019-11-10 (×3): 6 via TOPICAL

## 2019-11-08 NOTE — Progress Notes (Signed)
Paged Dr. Marletta Lor regarding BP 88/56.  MAP 67.  Patient asymptomatic.  Advised this is ok as Thien as MAP is stable and patient remains asymptomatic. Will continue to monitor.

## 2019-11-08 NOTE — Progress Notes (Signed)
ANTICOAGULATION CONSULT NOTE  Pharmacy Consult for Heparin  Indication: chest pain/ACS  Allergies  Allergen Reactions   Brilinta [Ticagrelor] Shortness Of Breath   Zetia [Ezetimibe] Other (See Comments)    Muscle aches   Statins Other (See Comments)    Failed Crestor 5 mg twice weekly, Crestor 20 mg daily, Pravastatin 40 mg qd, Lipitor, Zocor - muscle aches    Patient Measurements: Height: 5\' 6"  (167.6 cm) Weight: 77.8 kg (171 lb 8.3 oz) (scale A) IBW/kg (Calculated) : 63.8  Heparin dosing weight: 77.1 kg  Vital Signs: Temp: 97.8 F (36.6 C) (09/10 1942) Temp Source: Oral (09/10 1942) BP: 99/68 (09/10 1942) Pulse Rate: 56 (09/10 1942)  Labs: Recent Labs    11/06/19 1734 11/06/19 1734 11/06/19 1813 11/07/19 0034 11/07/19 2116 11/08/19 0646 11/08/19 1910  HGB 15.0   < > 14.7  --   --  16.4  --   HCT 44.0  --  44.5  --   --  49.4  --   PLT  --   --  236  --   --  241  --   LABPROT  --   --   --  12.7  --   --   --   INR  --   --   --  1.0  --   --   --   HEPARINUNFRC  --   --   --   --  0.17* 0.25* 0.27*  CREATININE  --   --  1.06 0.98  --  1.23  --    < > = values in this interval not displayed.    Estimated Creatinine Clearance: 55.6 mL/min (by C-G formula based on SCr of 1.23 mg/dL).   Assessment: 7 yom transferred from [facility] to [facility] with cath 9/8 showing left main disease, waiting on CABG post-Plavix washout. Pharmacy consulted to dose heparin.  -hepain level up to 0.27 on 1300 units/hr  Goal of Therapy:  Heparin level 0.3-0.7 units/ml Monitor platelets by anticoagulation protocol: Yes   Plan:  Increase heparin to 1450 units/hr Check 6hr heparin level Monitor daily heparin level and CBC, s/sx bleeding CABG planned 9/13  Ethelreda Sukhu A. Levada Dy, PharmD, BCPS, FNKF Clinical Pharmacist [place] Please utilize Amion for appropriate phone number to reach the unit pharmacist ([place])

## 2019-11-08 NOTE — Progress Notes (Signed)
Progress Note  Patient Name: LAYKEN DOENGES Date of Encounter: 11/08/2019  [facility] Cardiologist: Nelva Bush, MD   Subjective   Mr. Cuffe was admitted in transfer from [facility] with chronic catheterization by Dr. Saunders Revel revealing left main disease.  He was transferred for T CTS evaluation for CABG.  He currently denies chest pain or shortness of breath.  He remains on IV heparin.  Inpatient Medications    Scheduled Meds: . aspirin EC  81 mg Oral Daily  . furosemide  20 mg Intravenous BID  . losartan  50 mg Oral Daily  . metoprolol succinate  25 mg Oral Daily  . mupirocin ointment  1 application Nasal BID  . pantoprazole  40 mg Oral Daily  . pneumococcal 23 valent vaccine  0.5 mL Intramuscular Tomorrow-1000  . sodium chloride flush  3 mL Intravenous Q12H   Continuous Infusions: . sodium chloride    . cefTRIAXone (ROCEPHIN)  IV 2 g (11/07/19 1440)  . heparin 1,150 Units/hr (11/08/19 0400)   PRN Meds: sodium chloride, acetaminophen, hydrALAZINE, labetalol, ondansetron (ZOFRAN) IV, sodium chloride flush   Vital Signs    Vitals:   11/08/19 0355 11/08/19 0500 11/08/19 0710 11/08/19 0733  BP: 98/70   96/67  Pulse: 78   74  Resp: 16  15 19   Temp: 97.8 F (36.6 C)   (!) 97.5 F (36.4 C)  TempSrc: Oral   Oral  SpO2: 98%   95%  Weight:  77.8 kg    Height:        Intake/Output Summary (Last 24 hours) at 11/08/2019 0903 Last data filed at 11/08/2019 0400 Gross per 24 hour  Intake 1183.8 ml  Output 700 ml  Net 483.8 ml   Last 3 Weights 11/08/2019 11/07/2019 11/06/2019  Weight (lbs) 171 lb 8.3 oz 169 lb 15.6 oz 174 lb 13.2 oz  Weight (kg) 77.8 kg 77.1 kg 79.3 kg      Telemetry    Sinus rhythm with PVCs- Personally Reviewed  ECG    Not performed today- Personally Reviewed  Physical Exam   GEN: No acute distress.   Neck: No JVD Cardiac: RRR, no murmurs, rubs, or gallops.  Respiratory: Clear to auscultation bilaterally. GI: Soft, nontender, non-distended  MS: No  edema; No deformity. Neuro:  Nonfocal  Psych: Normal affect   Labs    High Sensitivity Troponin:  No results for input(s): TROPONINIHS in the last 720 hours.    Chemistry Recent Labs  Lab 11/06/19 1813 11/07/19 0034 11/08/19 0646  NA 137 137 136  K 3.9 4.3 4.7  CL 100 99 98  CO2 24 26 28   GLUCOSE 150* 124* 111*  BUN 20 22 30*  CREATININE 1.06 0.98 1.23  CALCIUM 9.4 9.7 10.0  PROT  --  7.4  --   ALBUMIN  --  3.9  --   AST  --  17  --   ALT  --  22  --   ALKPHOS  --  75  --   BILITOT  --  0.6  --   GFRNONAA >60 >60 60*  GFRAA >60 >60 >60  ANIONGAP 13 12 10      Hematology Recent Labs  Lab 11/06/19 1734 11/06/19 1813 11/08/19 0646  WBC  --  5.8 6.8  RBC  --  4.77 5.26  HGB 15.0 14.7 16.4  HCT 44.0 44.5 49.4  MCV  --  93.3 93.9  MCH  --  30.8 31.2  MCHC  --  33.0  33.2  RDW  --  12.3 12.4  PLT  --  236 241    BNP Recent Labs  Lab 11/05/19 2228  BNP 468.7*     DDimer No results for input(s): DDIMER in the last 168 hours.   Radiology    CT CHEST W CONTRAST  Result Date: 11/06/2019 CLINICAL DATA:  Exertional shortness of breath. Follow-up of pulmonary nodule. Diabetic ex-smoker. EXAM: CT CHEST WITH CONTRAST TECHNIQUE: Multidetector CT imaging of the chest was performed during intravenous contrast administration. CONTRAST:  52mL OMNIPAQUE IOHEXOL 300 MG/ML  SOLN COMPARISON:  12/11/2018 screening CT. FINDINGS: Cardiovascular: Aortic atherosclerosis. Borderline cardiomegaly. Multivessel coronary artery atherosclerosis. No central pulmonary embolism, on this non-dedicated study. Mediastinum/Nodes: No supraclavicular adenopathy. A node within the azygoesophageal recess measures 11 mm on 68/3 and is enlarged from 6 mm on the prior. Right infrahilar adenopathy at 1.6 cm on 74/3, new. Right hilar borderline adenopathy at 1.4 cm on 61/3, new. Lungs/Pleura: No pleural fluid.  Mild centrilobular emphysema. New dependent right lower lobe consolidation, including on 89/4. The  nodule along the right major fissure is likely a subpleural lymph node and measures on the order of 6 mm on 87/4, similar. Upper Abdomen: Hepatic steatosis. Normal imaged portions of the spleen, stomach, pancreas, gallbladder, adrenal glands, kidneys. Musculoskeletal: No acute osseous abnormality. IMPRESSION: 1. The perifissural right-sided pulmonary nodule is similar, and can be presumed a subpleural lymph node. 2. New right lower lobe consolidation, suspicious for pneumonia. 3. Mild right-sided thoracic adenopathy, likely reactive and secondary to pneumonia. 4. Aortic atherosclerosis (ICD10-I70.0), coronary artery atherosclerosis and emphysema (ICD10-J43.9). 5. Hepatic steatosis. Insert PRA call report Electronically Signed   By: Abigail Miyamoto M.D.   On: 11/06/2019 12:23   ECHOCARDIOGRAM COMPLETE  Result Date: 11/06/2019    ECHOCARDIOGRAM REPORT   Patient Name:   ABDULLAH RIZZI Wilmot Date of Exam: 11/06/2019 Medical Rec #:  [id number]    Height:       66.0 in Accession #:    [id number]   Weight:       174.8 lb Date of Birth:  May 14, 1950   BSA:          1.889 m Patient Age:    69 years     BP:           123/75 mmHg Patient Gender: M            HR:           69 bpm. Exam Location:  Inpatient Procedure: 2D Echo Indications:    786.50 chest pain  History:        Patient has prior history of Echocardiogram examinations, most                 recent 02/27/2018. Previous Myocardial Infarction; Risk                 Factors:Hypertension and Dyslipidemia. Unstable angina.  Sonographer:    [name] RDCS (AE) Referring Phys: [id number] PETER VAN TRIGT  Sonographer Comments: No parasternal window, suboptimal subcostal window and suboptimal apical window. IMPRESSIONS  1. Poor quality images no parasternal/subcostal images.  2. Septal apical and inferior basal akinesis hypokinesis of mid and apical inferior wall. Left ventricular ejection fraction, by estimation, is 25 to 30%. The left ventricle has severely decreased function. The left  ventricle demonstrates regional wall motion abnormalities (see scoring diagram/findings for description). The left ventricular internal cavity size was severely dilated. Left ventricular diastolic parameters were normal.  3. Right ventricular  systolic function is normal. The right ventricular size is normal.  4. The mitral valve is normal in structure. Mild mitral valve regurgitation. No evidence of mitral stenosis.  5. The aortic valve was not well visualized. Aortic valve regurgitation is not visualized. Mild aortic valve sclerosis is present, with no evidence of aortic valve stenosis.  6. The inferior vena cava is normal in size with greater than 50% respiratory variability, suggesting right atrial pressure of 3 mmHg. FINDINGS  Left Ventricle: Septal apical and inferior basal akinesis hypokinesis of mid and apical inferior wall. Left ventricular ejection fraction, by estimation, is 25 to 30%. The left ventricle has severely decreased function. The left ventricle demonstrates regional wall motion abnormalities. The left ventricular internal cavity size was severely dilated. There is no left ventricular hypertrophy. Left ventricular diastolic parameters were normal. Right Ventricle: The right ventricular size is normal. No increase in right ventricular wall thickness. Right ventricular systolic function is normal. Left Atrium: Left atrial size was normal in size. Right Atrium: Right atrial size was normal in size. Pericardium: There is no evidence of pericardial effusion. Mitral Valve: The mitral valve is normal in structure. There is moderate thickening of the mitral valve leaflet(s). There is mild calcification of the mitral valve leaflet(s). Mild mitral valve regurgitation. No evidence of mitral valve stenosis. Tricuspid Valve: The tricuspid valve is not well visualized. Tricuspid valve regurgitation is mild . No evidence of tricuspid stenosis. Aortic Valve: The aortic valve was not well visualized. Aortic valve  regurgitation is not visualized. Mild aortic valve sclerosis is present, with no evidence of aortic valve stenosis. Pulmonic Valve: The pulmonic valve was not well visualized. Pulmonic valve regurgitation is not visualized. No evidence of pulmonic stenosis. Aorta: The aortic root is normal in size and structure. Venous: The inferior vena cava is normal in size with greater than 50% respiratory variability, suggesting right atrial pressure of 3 mmHg. IAS/Shunts: No atrial level shunt detected by color flow Doppler. Additional Comments: Poor quality images no parasternal/subcostal images.   Diastology LV e' medial:    4.13 cm/s LV E/e' medial:  16.0 LV e' lateral:   6.96 cm/s LV E/e' lateral: 9.5  RIGHT VENTRICLE RV S prime:     12.80 cm/s TAPSE (M-mode): 2.3 cm LEFT ATRIUM             Index LA Vol (A2C):   26.0 ml 13.77 ml/m LA Vol (A4C):   44.6 ml 23.61 ml/m LA Biplane Vol: 34.4 ml 18.21 ml/m  AORTIC VALVE LVOT Vmax:   88.80 cm/s LVOT Vmean:  55.300 cm/s LVOT VTI:    0.192 m MITRAL VALVE MV Area (PHT): 2.37 cm    SHUNTS MV Decel Time: 320 msec    Systemic VTI: 0.19 m MV E velocity: 66.00 cm/s MV A velocity: 76.70 cm/s MV E/A ratio:  0.86 Jenkins Rouge MD Electronically signed by Jenkins Rouge MD Signature Date/Time: 11/06/2019/4:47:48 PM    Final    VAS US DOPPLER PRE CABG  Result Date: 11/07/2019 PREOPERATIVE VASCULAR EVALUATION  Indications:            Pre-CABG. Risk Factors:           Hypertension, hyperlipidemia, past history of smoking,                         coronary artery disease, PAD. Other Factors:          NICM. Vascular Interventions: Right CEA 01/05/06, mechanical thrombectomy and  thrombolysis of left SFA 06/04/2018. Limitations:            arrythmia Comparison Study:       Prior Carotid duplex done 07/17/2018, prior ABI done                         01/18/19. Performing Technologist: Sharion Dove RVS  Examination Guidelines: A complete evaluation includes B-mode imaging,  spectral Doppler, color Doppler, and power Doppler as needed of all accessible portions of each vessel. Bilateral testing is considered an integral part of a complete examination. Limited examinations for reoccurring indications may be performed as noted.  Right Carotid Findings: +----------+--------+--------+--------+------------+----------+           PSV cm/sEDV cm/sStenosisDescribe    Comments   +----------+--------+--------+--------+------------+----------+ CCA Prox  56      15              heterogenous           +----------+--------+--------+--------+------------+----------+ CCA Distal60      16              heterogenous           +----------+--------+--------+--------+------------+----------+ ICA Prox  31      12      1-39%   calcific    Patent CEA +----------+--------+--------+--------+------------+----------+ ICA Distal36      18                                     +----------+--------+--------+--------+------------+----------+ ECA       41      6                                      +----------+--------+--------+--------+------------+----------+ Portions of this table do not appear on this page. +----------+--------+-------+--------+------------+           PSV cm/sEDV cmsDescribeArm Pressure +----------+--------+-------+--------+------------+ Subclavian102                                 +----------+--------+-------+--------+------------+ +---------+--------+--+--------+-+ VertebralPSV cm/s32EDV cm/s9 +---------+--------+--+--------+-+ Left Carotid Findings: +----------+--------+--------+--------+--------+------------------+           PSV cm/sEDV cm/sStenosisDescribeComments           +----------+--------+--------+--------+--------+------------------+ CCA Prox  69      19                      intimal thickening +----------+--------+--------+--------+--------+------------------+ CCA Distal73      19                      intimal  thickening +----------+--------+--------+--------+--------+------------------+ ICA Prox  178     62      40-59%  calcificShadowing          +----------+--------+--------+--------+--------+------------------+ ICA Distal97      29                                         +----------+--------+--------+--------+--------+------------------+ ECA       155     12                                         +----------+--------+--------+--------+--------+------------------+ +----------+--------+--------+--------+------------+  SubclavianPSV cm/sEDV cm/sDescribeArm Pressure +----------+--------+--------+--------+------------+           57                                   +----------+--------+--------+--------+------------+ +---------+--------+--+--------+--+ VertebralPSV cm/s39EDV cm/s12 +---------+--------+--+--------+--+  ABI Findings: +---------+------------------+-----+---------+--------+ Right    Rt Pressure (mmHg)IndexWaveform Comment  +---------+------------------+-----+---------+--------+ Brachial 117                    triphasic         +---------+------------------+-----+---------+--------+ PTA      101               0.86 biphasic          +---------+------------------+-----+---------+--------+ DP       105               0.90 biphasic          +---------+------------------+-----+---------+--------+ Great Toe63                0.54                   +---------+------------------+-----+---------+--------+ +---------+------------------+-----+---------+-------+ Left     Lt Pressure (mmHg)IndexWaveform Comment +---------+------------------+-----+---------+-------+ Brachial 113                    triphasic        +---------+------------------+-----+---------+-------+ PTA      91                0.78 biphasic         +---------+------------------+-----+---------+-------+ DP       88                0.75 biphasic          +---------+------------------+-----+---------+-------+ Great Toe59                0.50                  +---------+------------------+-----+---------+-------+ +-------+---------------+----------------+ ABI/TBIToday's ABI/TBIPrevious ABI/TBI +-------+---------------+----------------+ Right  0.9            1.0              +-------+---------------+----------------+ Left   0.78           0.96             +-------+---------------+----------------+ Right ABIs appear decreased. Left ABIs appear decreased. Right Doppler Findings: +--------+--------+-----+---------+--------+ Site    PressureIndexDoppler  Comments +--------+--------+-----+---------+--------+ [id number]          triphasic         +--------+--------+-----+---------+--------+ Radial               triphasic         +--------+--------+-----+---------+--------+ Ulnar                triphasic         +--------+--------+-----+---------+--------+  Left Doppler Findings: +--------+--------+-----+---------+--------+ Site    PressureIndexDoppler  Comments +--------+--------+-----+---------+--------+ [id number]          triphasic         +--------+--------+-----+---------+--------+ Radial               triphasic         +--------+--------+-----+---------+--------+ Ulnar                triphasic         +--------+--------+-----+---------+--------+  Summary: Right Carotid: Velocities in the right ICA are consistent with a  1-39% stenosis. Left Carotid: Velocities in the left ICA are consistent with a 40-59% stenosis. Vertebrals:  Bilateral vertebral arteries demonstrate antegrade flow. Subclavians: Normal flow hemodynamics were seen in bilateral subclavian              arteries. Right ABI: Resting right ankle-brachial index indicates mild right lower extremity arterial disease. The right toe-brachial index is abnormal. Left ABI: Resting left ankle-brachial index indicates moderate left lower extremity  arterial disease. The left toe-brachial index is abnormal. Right Upper Extremity: Doppler waveform obliterate with right radial compression. Doppler waveforms remain within normal limits with right ulnar compression. Left Upper Extremity: Doppler waveform obliterate with left radial compression. Doppler waveforms remain within normal limits with left ulnar compression.   Electronically signed by Servando Snare MD on 11/07/2019 at 2:05:24 PM.    Final     Cardiac Studies   Chronic catheterization (11/05/2019)  Conclusion  Conclusions: 1. Multivessel CAD, including calcified 80% distal LMCA stenosis, small diffusely diseased distal LAD, chronically occluded OM1, and 50% mid RCA lesion. 2. Widely patent mid LCx stent. 3. Moderately elevated left heart filling pressure. 4. Mildly to moderately elevated right heart filling pressure. 5. Normal to mildly reduced cardiac output/index.  Recommendations: 1. Transfer to [facility] for cardiac surgery consultation for CABG, given severe LMCA disease and low LVEF. 2. Hold clopidogrel pending cardiac surgery consultation. 3. Obtain echocardiogram. 4. Aggressive secondary prevention.  Nelva Bush, MD [place]   Intervention  Implants     Patient Profile     GRIGOR LIPSCHUTZ is a 69yo male with history of CAD with NSTEMI 07/2015 s/p PCI to LCx and R/LHC (11/05/19 with mRCA 50%, dLM to ost LAD 80%, 1st marginal 100%s),known LBBB,NICM with EF 40-45% on 01/2018 echo, PAD s/p mechanical thrombectomy and catheter-directed thrombolysis to left SFA 05/2018, carotid artery stenosis, HTN, HLD, hiatal hernia,previous tobacco use,and PUD  Assessment & Plan    1: Coronary artery disease-history of prior circumflex stent with chronic catheterization performed yesterday showing severe left main disease distally just before the bifurcation.  Circumflex stent was patent and there was mild to moderate mid RCA stenosis.  He did take Plavix yesterday.  He was  seen by Dr. Darcey Nora who is tentatively scheduled him for CABG on Friday.  Unfortunately, his P2 Y 12 platelet tests came back at 121 suggesting he still had significant Plavix effect.  P2 Y12 performed yesterday was 106 suggesting continued residual Plavix effect.  Based on this his surgery was postponed until Monday.  2: Ischemic cardiomyopathy-prior echo showed EF 45 to 50% although LV function at the time of angiography showed EF in the 25 to 30% range.  I suspect this is ischemically mediated.  I suspect this will improve with revascularization.  He is on losartan, beta-blocker and diuretics.  His I/os -2 L.  He appears dry on exam.  3: Essential hypertension-blood pressure well controlled on lisinopril, amlodipine and metoprolol as an outpatient.  4: Hyperlipidemia-on Repatha followed by Dr. Rockey Situ  For questions or updates, please contact [place] Please consult [email or web address] for contact info under        Signed, Quay Burow, MD  11/08/2019, 9:03 AM

## 2019-11-08 NOTE — Progress Notes (Signed)
Peripherally Inserted Central Catheter Placement  The IV Nurse has discussed with the patient and/or persons authorized to consent for the patient, the purpose of this procedure and the potential benefits and risks involved with this procedure.  The benefits include less needle sticks, lab draws from the catheter, and the patient may be discharged home with the catheter. Risks include, but not limited to, infection, bleeding, blood clot (thrombus formation), and puncture of an artery; nerve damage and irregular heartbeat and possibility to perform a PICC exchange if needed/ordered by physician.  Alternatives to this procedure were also discussed.  Bard Power PICC patient education guide, fact sheet on infection prevention and patient information card has been provided to patient /or left at bedside.    PICC Placement Documentation  PICC Double Lumen 91/69/45 PICC Right Basilic 38 cm 0 cm (Active)  Indication for Insertion or Continuance of Line Vasoactive infusions 11/08/19 1156  Exposed Catheter (cm) 0 cm 11/08/19 1156  Site Assessment Clean;Dry;Intact 11/08/19 1156  Lumen #1 Status Flushed;Blood return noted 11/08/19 1156  Lumen #2 Status Flushed;Blood return noted 11/08/19 1156  Dressing Type Transparent 11/08/19 1156  Dressing Status Clean;Dry;Intact;Antimicrobial disc in place;Other (Comment) 11/08/19 1156  Dressing Intervention New dressing 11/08/19 0388  Dressing Change Due 11/15/19 11/08/19 1156       [name] 11/08/2019, 11:57 AM

## 2019-11-08 NOTE — Progress Notes (Addendum)
      [address]       [place] [address]             [phone]      Patient remains chest pain free.  Answered questions regarding timing of surgery.  Patient is scheduled for Monday with Dr. Donnie Mesa Barrett, PA-C   Ischemic cardiomyopathy, severe CAD, admitted in HF RLL pneumonia on CT scan- on Rocephin PAD with fem artery stents and chronic plavix Low plt count, low plt function-  Hi risk CABG rescheduled to Monday to allow adequate plavix washout  Blood pressure 96/67, pulse 74, temperature (!) 97.5 F (36.4 C), temperature source Oral, resp. rate 19, height 5\' 6"  (1.676 m), weight 77.8 kg, SpO2 95 %.    Tharon Aquas trigt MD

## 2019-11-08 NOTE — Consult Note (Addendum)
Advanced Heart Failure Team Consult Note   Primary Physician: Glean Hess, MD PCP-Cardiologist:  Nelva Bush, MD  Reason for Consultation: Heart Failure   HPI:    [name] is seen today for evaluation of heart failure at the request of Dr [name].   [name] is a 69 year old with a history of CAD, NSTEMI 2017 s/p PCI to LCX , LBBB, HTN, PAD, RCEA, left femoral stent (States term plavix), and hyperlipidemia.   Had ECHO 2019 that showed EF 40-45%   Over the last few weeks he had new dyspnea on exertion. Saw cardiology on 09/2319 and had EKG changes with new T wave inversion. He was set up cath.    Presented to [facility] for outpatient heart cath. Cath showed left main disease. ECHO completed and showed EF 25-30% with normal RV. Transferred to [facility] for CT surgery consultation. Deemed appropriate for CABG. Plavix been stopped but need wash out prior to surgery. Planning for CABG next week. He has been diuresing with IV lasix. Renal function has been stable.   P2 Y12-->132-->121   Echo 11/06/19 EF 25-30% RV normal   [facility] 11/05/19  RA 12 PCWP 25 Fick CO 3.9  Fick CI 2.1  PA sat 67%.   1. Multivessel CAD, including calcified 80% distal LMCA stenosis, small diffusely diseased distal LAD, chronically occluded OM1, and 50% mid RCA lesion. 2. Widely patent mid LCx stent. 3. Moderately elevated left heart filling pressure. 4. Mildly to moderately elevated right heart filling pressure. 5. Normal to mildly reduced cardiac output/index. Review of Systems: [y] = yes, [ ]  = no   . General: Weight gain [ ] ; Weight loss [ ] ; Anorexia [ ] ; Fatigue [ ] ; Fever [ ] ; Chills [ ] ; Weakness [ ]   . Cardiac: Chest pain/pressure [ ] ; Resting SOB [ ] ; Exertional SOB [Y ]; Orthopnea [ ] ; Pedal Edema [ ] ; Palpitations [ ] ; Syncope [ ] ; Presyncope [ ] ; Paroxysmal nocturnal dyspnea[ ]   . Pulmonary: Cough [ ] ; Wheezing[ ] ; Hemoptysis[ ] ; Sputum [ ] ; Snoring [ ]   . GI: Vomiting[ ] ; Dysphagia[ ] ;  Melena[ ] ; Hematochezia [ ] ; Heartburn[ ] ; Abdominal pain [ ] ; Constipation [ ] ; Diarrhea [ ] ; BRBPR [ ]   . GU: Hematuria[ ] ; Dysuria [ ] ; Nocturia[ ]   . Vascular: Pain in legs with walking [ ] ; Pain in feet with lying flat [ ] ; Non-healing sores [ ] ; Stroke [ ] ; TIA [ ] ; Slurred speech [ ] ;  . Neuro: Headaches[ ] ; Vertigo[ ] ; Seizures[ ] ; Paresthesias[ ] ;Blurred vision [ ] ; Diplopia [ ] ; Vision changes [ ]   . Ortho/Skin: Arthritis [ ] ; Joint pain Blue.Reese ]; Muscle pain [ ] ; Joint swelling [ ] ; Back Pain [ ] ; Rash [ ]   . Psych: Depression[ ] ; Anxiety[ ]   . Heme: Bleeding problems [ ] ; Clotting disorders [ ] ; Anemia [ ]   . Endocrine: Diabetes [ ] ; Thyroid dysfunction[ ]   Home Medications Prior to Admission medications   Medication Sig Start Date End Date Taking? Authorizing Provider  acetaminophen (TYLENOL) 500 MG tablet Take 1,000 mg by mouth every 6 (six) hours as needed for mild pain.   Yes [provider]  amLODipine (NORVASC) 5 MG tablet TAKE 1 TABLET BY MOUTH EVERY DAY Patient taking differently: Take 5 mg by mouth daily.  10/14/19  Yes End, Harrell Gave, MD  aspirin EC 81 MG tablet Take 1 tablet (81 mg total) by mouth daily. 06/05/18  Yes Stegmayer, Janalyn Harder, PA-C  clopidogrel (PLAVIX) 75 MG tablet TAKE ONE TABLET BY MOUTH DAILY. Patient taking differently: Take 75 mg by mouth daily.  05/13/19  Yes Kris Hartmann, NP  lisinopril (ZESTRIL) 20 MG tablet TAKE 1 TABLET BY MOUTH EVERY DAY Patient taking differently: Take 20 mg by mouth daily.  10/14/19  Yes End, Harrell Gave, MD  meloxicam (MOBIC) 7.5 MG tablet Take 1 tablet (7.5 mg) by mouth once daily as needed   Yes [provider]  metoprolol succinate (TOPROL-XL) 25 MG 24 hr tablet TAKE 1 TABLET BY MOUTH EVERY DAY Patient taking differently: Take 25 mg by mouth daily.  04/30/19  Yes End, Harrell Gave, MD  naproxen sodium (ALEVE) 220 MG tablet Take 220 mg by mouth as needed (pain).   Yes [provider]  nitroGLYCERIN  (NITROSTAT) 0.4 MG SL tablet Place 1 tablet (0.4 mg total) under the tongue every 5 (five) minutes as needed for chest pain. 08/08/15  Yes Kilroy, Luke K, PA-C  pantoprazole (PROTONIX) 40 MG tablet Take 1 tablet (40 mg total) by mouth daily. 09/13/19  Yes [name], MD  triamcinolone cream (KENALOG) 0.1 % Apply 1 application topically 2 (two) times daily as needed for itching. 10/23/19  Yes [provider]  Alirocumab (PRALUENT) 150 MG/ML SOAJ Inject 1 pen into the skin every 14 (fourteen) days. 11/06/19   End, Harrell Gave, MD  cyclobenzaprine (FLEXERIL) 10 MG tablet Take 1 tablet (10 mg total) by mouth 3 (three) times daily as needed for muscle spasms. Patient not taking: Reported on 11/06/2019 06/14/18   Glean Hess, MD  furosemide (LASIX) 20 MG tablet Take 1 tablet (20 mg) by mouth once daily x 3 days, then take 1 tablet daily as directed Patient not taking: Reported on 10/28/2019 10/21/19   Arvil Chaco, PA-C    Past Medical History: Past Medical History:  Diagnosis Date  . Abnormal nuclear cardiac imaging test 08/08/2015  . Arthritis    fingers  . Carotid artery occlusion   . Coronary atherosclerosis of native coronary artery 01/29/2013   11/05/19 R/LHC 80% dLMCA stenosis small diffusely dz dLAD, chronically occluded OM1 50%mRCA lesion, widely patent mLCx strent, moderately elevated L heart filling pressures, mild to moderate RH filling pressures, normal to moderately reduced CO  . Duodenal erosion   . Encounter for screening for lung cancer 07/13/2016  . Esophageal stenosis    esophageal dilation  . GERD (gastroesophageal reflux disease)   . H. pylori infection   . Heart attack [facility]) Oct. 2009   Mild  . Hiatal hernia   . Hyperlipidemia   . Hypertension   . 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 Cardiac catheterization-nonobstructive 60% RCA disease-subsequent nuclear stress test-9 minutes, low risk, mild inferior  wall hypokinesis   . Pain in limb 12/19/2017  . Peripheral vascular disease ([place])   . Unstable angina ([place]) 11/25/2017    Past Surgical History: Past Surgical History:  Procedure Laterality Date  . APPENDECTOMY    . BACK SURGERY    . CARDIAC CATHETERIZATION N/A 08/07/2015   Procedure: Left Heart Cath and Coronary Angiography;  Surgeon: [name], MD;  Location: [place] CV LAB;  Service: Cardiovascular;  Laterality: N/A;  . CARDIAC CATHETERIZATION N/A 08/07/2015   Procedure: Coronary Stent Intervention;  Surgeon: [name], MD;  Location: [place] CV LAB;  Service: Cardiovascular;  Laterality: N/A;  . CARDIAC CATHETERIZATION N/A 08/07/2015   Procedure: Coronary Stent Intervention;  Surgeon: Ander Slade  Martinique, MD;  Location: [place] CV LAB;  Service: Cardiovascular;  Laterality: N/A;  . CAROTID ENDARTERECTOMY  01/05/2006   Right  CEA with DPA  . CATARACT EXTRACTION W/ INTRAOCULAR LENS IMPLANT Left 12/04/2017  . CATARACT EXTRACTION W/PHACO Left 12/04/2017   Procedure: CATARACT EXTRACTION PHACO AND INTRAOCULAR LENS PLACEMENT ([place]) LEFT;  Surgeon: Eulogio Bear, MD;  Location: [place];  Service: Ophthalmology;  Laterality: Left;  . CATARACT EXTRACTION W/PHACO Right 02/06/2018   Procedure: CATARACT EXTRACTION PHACO AND INTRAOCULAR LENS PLACEMENT ([facility])RIGHT;  Surgeon: Eulogio Bear, MD;  Location: [place];  Service: Ophthalmology;  Laterality: Right;  . COLONOSCOPY  05/20/2008  . COLONOSCOPY WITH PROPOFOL N/A 09/13/2018   Procedure: COLONOSCOPY WITH BIOPSY;  Surgeon: [name], MD;  Location: [place];  Service: Endoscopy;  Laterality: N/A;  . CORONARY STENT PLACEMENT  08/07/2015   MID CIRCUMFLEX  . ESOPHAGOGASTRODUODENOSCOPY (EGD) WITH PROPOFOL N/A 02/11/2019   Procedure: ESOPHAGOGASTRODUODENOSCOPY (EGD) WITH BIOPSY and  Dilation;  Surgeon: [name], MD;  Location: [place];  Service: Endoscopy;  Laterality: N/A;  . HIP  SURGERY Left 10/2016   left hip tendon repair  . LEFT HEART CATH AND CORONARY ANGIOGRAPHY N/A 11/27/2017   Procedure: LEFT HEART CATH AND CORONARY ANGIOGRAPHY;  Surgeon: Wellington Hampshire, MD;  Location: [place] [facility];  Service: Cardiovascular;  Laterality: N/A;  . LOWER EXTREMITY ANGIOGRAPHY Left 02/12/2018   Procedure: LOWER EXTREMITY ANGIOGRAPHY;  Surgeon: Algernon Huxley, MD;  Location: [place] CV LAB;  Service: Cardiovascular;  Laterality: Left;  . LOWER EXTREMITY ANGIOGRAPHY Left 03/07/2018   Procedure: LOWER EXTREMITY ANGIOGRAPHY;  Surgeon: Algernon Huxley, MD;  Location: [place] CV LAB;  Service: Cardiovascular;  Laterality: Left;  . LOWER EXTREMITY ANGIOGRAPHY Left 06/04/2018   Procedure: LOWER EXTREMITY ANGIOGRAPHY;  Surgeon: Algernon Huxley, MD;  Location: [place] CV LAB;  Service: Cardiovascular;  Laterality: Left;  . POLYPECTOMY N/A 09/13/2018   Procedure: POLYPECTOMY;  Surgeon: [name], MD;  Location: [place];  Service: Endoscopy;  Laterality: N/A;  . POLYPECTOMY N/A 02/11/2019   Procedure: POLYPECTOMY;  Surgeon: [name], MD;  Location: [place];  Service: Endoscopy;  Laterality: N/A;  . RIGHT/LEFT HEART CATH AND CORONARY ANGIOGRAPHY N/A 11/05/2019   Procedure: RIGHT/LEFT HEART CATH AND CORONARY ANGIOGRAPHY;  Surgeon: Nelva Bush, MD;  Location: [place] CV LAB;  Service: Cardiovascular;  Laterality: N/A;  . SPINE SURGERY    . TONSILLECTOMY      Family History: Family History  Problem Relation Age of Onset  . Heart attack Mother   . Coronary artery disease Mother   . Heart disease Mother        Carotid Stenosis and BPG and Heart Disease before age 28  . Diabetes Mother   . Hypertension Mother   . Heart attack Father   . Heart disease Father        BPG and Heart Disease before age 62  . Hypertension Father   . Cancer Father 55       throat  . Stroke Father   . Colon cancer Neg Hx   . Colon polyps Neg Hx   .  Esophageal cancer Neg Hx   . Rectal cancer Neg Hx   . Stomach cancer Neg Hx     Social History: Social History   Socioeconomic History  . Marital status: Married    Spouse name: Not on file  . Number of children: 1  .  Years of education: Not on file  . Highest education level: Bachelor's degree (e.g., BA, AB, BS)  Occupational History  . Not on file  Tobacco Use  . Smoking status: Former Smoker    Packs/day: 1.25    Years: 35.00    Pack years: 43.75    Types: Cigarettes    Quit date: 02/28/2005    Years since quitting: 14.7  . Smokeless tobacco: Current User    Types: Snuff  . Tobacco comment: occaisionally  Vaping Use  . Vaping Use: Never used  Substance and Sexual Activity  . Alcohol use: Yes    Alcohol/week: 8.0 - 10.0 standard drinks    Types: 8 - 10 Glasses of wine per week  . Drug use: No  . Sexual activity: Yes  Other Topics Concern  . Not on file  Social History Narrative  . Not on file   Social Determinants of Health   Financial Resource Strain: Low Risk   . Difficulty of Paying Living Expenses: Not hard at all  Food Insecurity: No Food Insecurity  . Worried About Charity fundraiser in the Last Year: Never true  . Ran Out of Food in the Last Year: Never true  Transportation Needs: No Transportation Needs  . Lack of Transportation (Medical): No  . Lack of Transportation (Non-Medical): No  Physical Activity: Sufficiently Active  . Days of Exercise per Week: 4 days  . Minutes of Exercise per Session: 60 min  Stress: No Stress Concern Present  . Feeling of Stress : Not at all  Social Connections: Moderately Integrated  . Frequency of Communication with Friends and Family: More than three times a week  . Frequency of Social Gatherings with Friends and Family: Three times a week  . Attends Religious Services: Never  . Active Member of Clubs or Organizations: Yes  . Attends Archivist Meetings: More than 4 times per year  . Marital Status:  Married    Allergies:  Allergies  Allergen Reactions  . Brilinta [Ticagrelor] Shortness Of Breath  . Zetia [Ezetimibe] Other (See Comments)    Muscle aches  . Statins Other (See Comments)    Failed Crestor 5 mg twice weekly, Crestor 20 mg daily, Pravastatin 40 mg qd, Lipitor, Zocor - muscle aches    Objective:    Vital Signs:   Temp:  [97.5 F (36.4 C)-98 F (36.7 C)] 97.5 F (36.4 C) (09/10 1047) Pulse Rate:  [52-78] 61 (09/10 1047) Resp:  [15-19] 16 (09/10 1047) BP: (96-120)/(67-77) 107/70 (09/10 1047) SpO2:  [92 %-99 %] 94 % (09/10 1047) Weight:  [77.8 kg] 77.8 kg (09/10 0500) Last BM Date: 11/05/19  Weight change: Filed Weights   11/06/19 0500 11/07/19 0415 11/08/19 0500  Weight: 79.3 kg 77.1 kg 77.8 kg    Intake/Output:   Intake/Output Summary (Last 24 hours) at 11/08/2019 1116 Last data filed at 11/08/2019 0400 Gross per 24 hour  Intake 703.8 ml  Output 700 ml  Net 3.8 ml      Physical Exam    General:  No resp difficulty HEENT: normal Neck: supple. JVP 9-10 . Carotids 2+ bilat; no bruits. No lymphadenopathy or thyromegaly appreciated. Cor: PMI nondisplaced. Regular rate & rhythm. No rubs, gallops or murmurs. Lungs: clear Abdomen: soft, nontender, nondistended. No hepatosplenomegaly. No bruits or masses. Good bowel sounds. Extremities: no cyanosis, clubbing, rash, edema. RUE PICC Neuro: alert & orientedx3, cranial nerves grossly intact. moves all 4 extremities w/o difficulty. Affect pleasant   Telemetry  SR 60-70s with frequent PVCs.   EKG   n/a  Labs   Basic Metabolic Panel: Recent Labs  Lab 11/05/19 2227 11/05/19 2227 11/06/19 1734 11/06/19 1813 11/07/19 0034 11/08/19 0646  NA 138  --  137 137 137 136  K 3.7  --  3.9 3.9 4.3 4.7  CL 104  --   --  100 99 98  CO2 25  --   --  24 26 28   GLUCOSE 164*  --   --  150* 124* 111*  BUN 17  --   --  20 22 30*  CREATININE 0.91  --   --  1.06 0.98 1.23  CALCIUM 8.7*   < >  --  9.4 9.7 10.0    MG 1.6*  --   --   --   --  1.9   < > = values in this interval not displayed.    Liver Function Tests: Recent Labs  Lab 11/07/19 0034  AST 17  ALT 22  ALKPHOS 75  BILITOT 0.6  PROT 7.4  ALBUMIN 3.9   No results for input(s): LIPASE, AMYLASE in the last 168 hours. No results for input(s): AMMONIA in the last 168 hours.  CBC: Recent Labs  Lab 11/06/19 1734 11/06/19 1813 11/08/19 0646  WBC  --  5.8 6.8  HGB 15.0 14.7 16.4  HCT 44.0 44.5 49.4  MCV  --  93.3 93.9  PLT  --  236 241    Cardiac Enzymes: No results for input(s): CKTOTAL, CKMB, CKMBINDEX, TROPONINI in the last 168 hours.  BNP: BNP (last 3 results) Recent Labs    11/05/19 2228  BNP 468.7*    ProBNP (last 3 results) No results for input(s): PROBNP in the last 8760 hours.   CBG: No results for input(s): GLUCAP in the last 168 hours.  Coagulation Studies: Recent Labs    11/07/19 0034  LABPROT 12.7  INR 1.0     Imaging   [place] EKG SITE RITE  Result Date: 11/08/2019 If Site Rite image not attached, placement could not be confirmed due to current cardiac rhythm.     Medications:     Current Medications: . aspirin EC  81 mg Oral Daily  . furosemide  20 mg Intravenous BID  . losartan  50 mg Oral Daily  . metoprolol succinate  25 mg Oral Daily  . mupirocin ointment  1 application Nasal BID  . pantoprazole  40 mg Oral Daily  . pneumococcal 23 valent vaccine  0.5 mL Intramuscular Tomorrow-1000  . sodium chloride flush  3 mL Intravenous Q12H     Infusions: . sodium chloride    . cefTRIAXone (ROCEPHIN)  IV 2 g (11/07/19 1440)  . heparin 1,300 Units/hr (11/08/19 1018)        Assessment/Plan   1. CAD, H/O PCI to LCX in 2017.  LHC 9/7 Multivessel CAD, including calcified 80% distal LMCA stenosis, small diffusely diseased distal LAD, chronically occluded OM1, and 50% mid RCA lesion. CT surgery consulted. Plan for CABG next week. - No chest pain.  - On aspirin 81 mg daily. - Not  on statin due to myalgias. Has been on praluent.   - Heparin gtt  2. Acute/Chronic Systolic Heart Failure, ICM Had Previous Ef 2019 with EF 40-45% now EF down 25-30%. Also has LBBB so if EF remains < 35% would need CRT-D  RHC with PCWP 25 RA 12 CO 3.9 CI 2.1  Diuresing with lasix.  - Place PICC for  CVP and CO-OX.  - Continue Toprol XL 25 mg daily  - Continue losartan at current dose. Anticipate switching to entresto prior to discharge.   3. PVCs Frequent PVCs noted.  - Add amio 200 mg twice a day.   4. LBBB If EF remains < 35% will need CRT-D  5. HTN  Stable.   6. Hyperlipidemia Intolerant statin due myalgia.  In the Clear Study with Praluent injection every 2 weeks.  7. H/O PUD Continue PPI.     Length of Stay: 3  Amy Clegg, NP  11/08/2019, 11:16 AM  Advanced Heart Failure Team Pager [phone] (M-F; 7a - 4p)  Please contact [facility] for night-coverage after hours (4p -7a ) and weekends on [email or web address]  Patient seen with NP, agree with the above note.   No complaints currently, doing well post-cath.  Has severe LM disease, totally occluded OM, 50% mid RCA. Plan for CABG but delayed today because of need for Plavix washout.   Echo with EF 25-30%.   General: NAD Neck: JVP 8-9 cm, no thyromegaly or thyroid nodule.  Lungs: Clear to auscultation bilaterally with normal respiratory effort. CV: Nondisplaced PMI.  Heart regular S1/S2, no S3/S4, no murmur.  No peripheral edema.   Abdomen: Soft, nontender, no hepatosplenomegaly, no distention.  Skin: Intact without lesions or rashes.  Neurologic: Alert and oriented x 3.  Psych: Normal affect. Extremities: No clubbing or cyanosis.  HEENT: Normal.   He continues on heparin gtt and ASA, no statin given intolerance (gets Praluent at home).  Needs ongoing Plavix washout.  - He will need CABG, plan for Monday morning.   Ischemic cardiomyopathy, CI 2.1 by Fick.  Still with mild volume overload on exam.  Now has PICC.  -  Continue losartan 50 mg daily, transition to [facility] post-surgery.  - Continue Toprol XL 25 daily.  - Can add spironolactone tomorrow if labs stable.  - Now has PICC line, will check CVP and co-ox.  For now, continue current IV Lasix.  Will adjust based on CVP.  - Has wide LBBB, if EF not better with CABG, will need CRT.   Loralie Champagne 11/08/2019 1:36 PM

## 2019-11-08 NOTE — Progress Notes (Signed)
[place] for Heparin  Indication: chest pain/ACS  Allergies  Allergen Reactions  . Brilinta [Ticagrelor] Shortness Of Breath  . Zetia [Ezetimibe] Other (See Comments)    Muscle aches  . Statins Other (See Comments)    Failed Crestor 5 mg twice weekly, Crestor 20 mg daily, Pravastatin 40 mg qd, Lipitor, Zocor - muscle aches    Patient Measurements: Height: 5\' 6"  (167.6 cm) Weight: 77.8 kg (171 lb 8.3 oz) (scale A) IBW/kg (Calculated) : 63.8  Heparin dosing weight: 77.1 kg  Vital Signs: Temp: 97.8 F (36.6 C) (09/10 0355) Temp Source: Oral (09/10 0355) BP: 98/70 (09/10 0355) Pulse Rate: 78 (09/10 0355)  Labs: Recent Labs    11/05/19 2227 11/06/19 1734 11/06/19 1734 11/06/19 1813 11/07/19 0034 11/07/19 2116 11/08/19 0646  HGB  --  15.0   < > 14.7  --   --  16.4  HCT  --  44.0  --  44.5  --   --  49.4  PLT  --   --   --  236  --   --  241  LABPROT  --   --   --   --  12.7  --   --   INR  --   --   --   --  1.0  --   --   HEPARINUNFRC  --   --   --   --   --  0.17* 0.25*  CREATININE 0.91  --   --  1.06 0.98  --   --    < > = values in this interval not displayed.    Estimated Creatinine Clearance: 69.8 mL/min (by C-G formula based on SCr of 0.98 mg/dL).   Assessment: 57 yom transferred from [facility] to [facility] with cath 9/8 showing left main disease, waiting on CABG post-Plavix washout. Pharmacy consulted to dose heparin.  -hepain level up to 0.25 on 1150 units/hr  Goal of Therapy:  Heparin level 0.3-0.7 units/ml Monitor platelets by anticoagulation protocol: Yes   Plan:  Increase heparin to 1300 units/hr Check 6hr heparin level Monitor daily heparin level and CBC, s/sx bleeding CABG planned 9/13  Hildred Laser, PharmD Clinical Pharmacist **Pharmacist phone directory can now be found on [email or web address] ([email or web address]).  Listed under [place].

## 2019-11-08 NOTE — Plan of Care (Signed)
  Problem: Education: Goal: Knowledge of General Education information will improve Description: Including pain rating scale, medication(s)/side effects and non-pharmacologic comfort measures Outcome: Progressing   Problem: Clinical Measurements: Goal: Ability to maintain clinical measurements within normal limits will improve Outcome: Progressing Goal: Will remain free from infection Outcome: Progressing   Problem: Nutrition: Goal: Adequate nutrition will be maintained Outcome: Progressing   Problem: Elimination: Goal: Will not experience complications related to urinary retention Outcome: Progressing   Problem: Safety: Goal: Ability to remain free from injury will improve Outcome: Progressing   Problem: Skin Integrity: Goal: Risk for impaired skin integrity will decrease Outcome: Progressing

## 2019-11-08 NOTE — Progress Notes (Signed)
Pt has had busy day but walked once earlier and will walk again with wife later. Reviewed preop ed, answered questions. He can walk on his own over weekend on unit. [place], ACSM 2:50 PM 11/08/2019

## 2019-11-09 DIAGNOSIS — I5021 Acute systolic (congestive) heart failure: Secondary | ICD-10-CM

## 2019-11-09 LAB — CBC
HCT: 45.9 % (ref 39.0–52.0)
Hemoglobin: 15.2 g/dL (ref 13.0–17.0)
MCH: 30.8 pg (ref 26.0–34.0)
MCHC: 33.1 g/dL (ref 30.0–36.0)
MCV: 92.9 fL (ref 80.0–100.0)
Platelets: 221 10*3/uL (ref 150–400)
RBC: 4.94 MIL/uL (ref 4.22–5.81)
RDW: 12.2 % (ref 11.5–15.5)
WBC: 6.3 10*3/uL (ref 4.0–10.5)
nRBC: 0 % (ref 0.0–0.2)

## 2019-11-09 LAB — BASIC METABOLIC PANEL
Anion gap: 13 (ref 5–15)
BUN: 39 mg/dL — ABNORMAL HIGH (ref 8–23)
CO2: 25 mmol/L (ref 22–32)
Calcium: 9.3 mg/dL (ref 8.9–10.3)
Chloride: 98 mmol/L (ref 98–111)
Creatinine, Ser: 1.34 mg/dL — ABNORMAL HIGH (ref 0.61–1.24)
GFR calc Af Amer: >60 mL/min (ref >60)
GFR calc non Af Amer: 54 mL/min — ABNORMAL LOW (ref >60)
Glucose, Bld: 117 mg/dL — ABNORMAL HIGH (ref 70–99)
Potassium: 3.9 mmol/L (ref 3.5–5.1)
Sodium: 136 mmol/L (ref 135–145)

## 2019-11-09 LAB — HEPARIN LEVEL (UNFRACTIONATED): Heparin Unfractionated: 0.5 IU/mL (ref 0.30–0.70)

## 2019-11-09 LAB — COOXEMETRY PANEL
Carboxyhemoglobin: 1.2 % (ref 0.5–1.5)
Methemoglobin: 1.2 % (ref 0.0–1.5)
O2 Saturation: 58.7 %
Total hemoglobin: 15.3 g/dL (ref 12.0–16.0)

## 2019-11-09 MED ORDER — METOPROLOL SUCCINATE ER 25 MG PO TB24
25.0000 mg | ORAL_TABLET | Freq: Every day | ORAL | Status: DC
  Administered 2019-11-09 – 2019-11-10 (×2): 25 mg via ORAL
  Filled 2019-11-09: qty 1

## 2019-11-09 MED ORDER — PHENYLEPHRINE HCL-NACL 20-0.9 MG/250ML-% IV SOLN
30.0000 ug/min | INTRAVENOUS | Status: AC
  Administered 2019-11-11: 50 ug/min via INTRAVENOUS
  Filled 2019-11-09: qty 250

## 2019-11-09 MED ORDER — TRANEXAMIC ACID 1000 MG/10ML IV SOLN
1.5000 mg/kg/h | INTRAVENOUS | Status: AC
  Administered 2019-11-11: 1.5 mg/kg/h via INTRAVENOUS
  Filled 2019-11-09: qty 25

## 2019-11-09 MED ORDER — PLASMA-LYTE 148 IV SOLN
INTRAVENOUS | Status: DC
  Filled 2019-11-09: qty 2.5

## 2019-11-09 MED ORDER — MAGNESIUM SULFATE 50 % IJ SOLN
40.0000 meq | INTRAMUSCULAR | Status: DC
  Filled 2019-11-09: qty 9.85

## 2019-11-09 MED ORDER — SODIUM CHLORIDE 0.9 % IV SOLN
750.0000 mg | INTRAVENOUS | Status: AC
  Administered 2019-11-11: 750 mg via INTRAVENOUS
  Filled 2019-11-09: qty 750

## 2019-11-09 MED ORDER — SODIUM CHLORIDE 0.9 % IV SOLN
1.5000 g | INTRAVENOUS | Status: AC
  Administered 2019-11-11: 1.5 g via INTRAVENOUS
  Filled 2019-11-09: qty 1.5

## 2019-11-09 MED ORDER — POTASSIUM CHLORIDE 2 MEQ/ML IV SOLN
80.0000 meq | INTRAVENOUS | Status: DC
  Filled 2019-11-09: qty 40

## 2019-11-09 MED ORDER — EPINEPHRINE HCL 5 MG/250ML IV SOLN IN NS
0.0000 ug/min | INTRAVENOUS | Status: AC
  Administered 2019-11-11: 2 ug/min via INTRAVENOUS
  Filled 2019-11-09: qty 250

## 2019-11-09 MED ORDER — TRANEXAMIC ACID (OHS) BOLUS VIA INFUSION
15.0000 mg/kg | INTRAVENOUS | Status: AC
  Administered 2019-11-11: 1174.5 mg via INTRAVENOUS
  Filled 2019-11-09: qty 1175

## 2019-11-09 MED ORDER — NOREPINEPHRINE 4 MG/250ML-% IV SOLN
0.0000 ug/min | INTRAVENOUS | Status: AC
  Administered 2019-11-11: 4 ug/min via INTRAVENOUS
  Filled 2019-11-09: qty 250

## 2019-11-09 MED ORDER — INSULIN REGULAR(HUMAN) IN NACL 100-0.9 UT/100ML-% IV SOLN
INTRAVENOUS | Status: AC
  Administered 2019-11-11: .8 [IU]/h via INTRAVENOUS
  Filled 2019-11-09: qty 100

## 2019-11-09 MED ORDER — DEXMEDETOMIDINE HCL IN NACL 400 MCG/100ML IV SOLN
0.1000 ug/kg/h | INTRAVENOUS | Status: AC
  Administered 2019-11-11: .5 ug/kg/h via INTRAVENOUS
  Filled 2019-11-09: qty 100

## 2019-11-09 MED ORDER — SODIUM CHLORIDE 0.9 % IV SOLN
INTRAVENOUS | Status: DC
  Filled 2019-11-09: qty 30

## 2019-11-09 MED ORDER — NITROGLYCERIN IN D5W 200-5 MCG/ML-% IV SOLN
2.0000 ug/min | INTRAVENOUS | Status: DC
  Filled 2019-11-09: qty 250

## 2019-11-09 MED ORDER — TRANEXAMIC ACID (OHS) PUMP PRIME SOLUTION
2.0000 mg/kg | INTRAVENOUS | Status: DC
  Filled 2019-11-09: qty 1.57

## 2019-11-09 MED ORDER — MILRINONE LACTATE IN DEXTROSE 20-5 MG/100ML-% IV SOLN
0.3000 ug/kg/min | INTRAVENOUS | Status: AC
  Administered 2019-11-11: .25 ug/kg/min via INTRAVENOUS
  Filled 2019-11-09: qty 100

## 2019-11-09 MED ORDER — VANCOMYCIN HCL 1250 MG/250ML IV SOLN
1250.0000 mg | INTRAVENOUS | Status: AC
  Administered 2019-11-11: 1250 mg via INTRAVENOUS
  Filled 2019-11-09: qty 250

## 2019-11-09 NOTE — Progress Notes (Signed)
Patient ID: [name], male   DOB: 1951/02/06, 69 y.o.   MRN: [id number]     Advanced Heart Failure Rounding Note  PCP-Cardiologist: Nelva Bush, MD   Subjective:    No complaints this morning.  No chest pain.  Breathing better with diuresis yesterday.   CVP 5 today, co-ox 59%.    Objective:   Weight Range: 78.3 kg Body mass index is 27.86 kg/m.   Vital Signs:   Temp:  [97.6 F (36.4 C)-98 F (36.7 C)] 97.6 F (36.4 C) (09/11 0855) Pulse Rate:  [56-78] 78 (09/11 0855) Resp:  [16-19] 19 (09/11 0855) BP: (88-111)/(56-71) 111/68 (09/11 0855) SpO2:  [93 %-99 %] 93 % (09/11 0855) Weight:  [78.3 kg] 78.3 kg (09/11 0627) Last BM Date: 11/07/19  Weight change: Filed Weights   11/07/19 0415 11/08/19 0500 11/09/19 0627  Weight: 77.1 kg 77.8 kg 78.3 kg    Intake/Output:   Intake/Output Summary (Last 24 hours) at 11/09/2019 1210 Last data filed at 11/09/2019 0900 Gross per 24 hour  Intake 660.37 ml  Output 1325 ml  Net -664.63 ml      Physical Exam    General:  Well appearing. No resp difficulty HEENT: Normal Neck: Supple. JVP not elevated. Carotids 2+ bilat; no bruits. No lymphadenopathy or thyromegaly appreciated. Cor: PMI nondisplaced. Regular rate & rhythm. No rubs, gallops or murmurs. Lungs: Clear Abdomen: Soft, nontender, nondistended. No hepatosplenomegaly. No bruits or masses. Good bowel sounds. Extremities: No cyanosis, clubbing, rash, edema Neuro: Alert & orientedx3, cranial nerves grossly intact. moves all 4 extremities w/o difficulty. Affect pleasant   Telemetry   NSR with LBBB, frequent PVCs (personally reviewed)  Labs    CBC Recent Labs    11/08/19 0646 11/09/19 0215  WBC 6.8 6.3  HGB 16.4 15.2  HCT 49.4 45.9  MCV 93.9 92.9  PLT 241 846   Basic Metabolic Panel Recent Labs    11/08/19 0646 11/09/19 0215  NA 136 136  K 4.7 3.9  CL 98 98  CO2 28 25  GLUCOSE 111* 117*  BUN 30* 39*  CREATININE 1.23 1.34*  CALCIUM 10.0 9.3  MG  1.9  --    Liver Function Tests Recent Labs    11/07/19 0034  AST 17  ALT 22  ALKPHOS 75  BILITOT 0.6  PROT 7.4  ALBUMIN 3.9   No results for input(s): LIPASE, AMYLASE in the last 72 hours. Cardiac Enzymes No results for input(s): CKTOTAL, CKMB, CKMBINDEX, TROPONINI in the last 72 hours.  BNP: BNP (last 3 results) Recent Labs    11/05/19 2228  BNP 468.7*    ProBNP (last 3 results) No results for input(s): PROBNP in the last 8760 hours.   D-Dimer No results for input(s): DDIMER in the last 72 hours. Hemoglobin A1C Recent Labs    11/07/19 0034  HGBA1C 5.6   Fasting Lipid Panel No results for input(s): CHOL, HDL, LDLCALC, TRIG, CHOLHDL, LDLDIRECT in the last 72 hours. Thyroid Function Tests Recent Labs    11/07/19 0034  TSH 3.303    Other results:   Imaging     No results found.   Medications:     Scheduled Medications: . amiodarone  200 mg Oral BID  . aspirin EC  81 mg Oral Daily  . Chlorhexidine Gluconate Cloth  6 each Topical Daily  . metoprolol succinate  25 mg Oral Daily  . mupirocin ointment  1 application Nasal BID  . pantoprazole  40 mg Oral Daily  .  pneumococcal 23 valent vaccine  0.5 mL Intramuscular Tomorrow-1000  . sodium chloride flush  10-40 mL Intracatheter Q12H  . sodium chloride flush  3 mL Intravenous Q12H     Infusions: . sodium chloride    . cefTRIAXone (ROCEPHIN)  IV 200 mL/hr at 11/08/19 1841  . heparin 1,450 Units/hr (11/09/19 0922)     PRN Medications:  sodium chloride, acetaminophen, hydrALAZINE, labetalol, ondansetron (ZOFRAN) IV, sodium chloride flush, sodium chloride flush    Assessment/Plan   1. CAD: H/o PCI to LCX in 2017. LHC 11/05/19 with multivessel CAD, including calcified 80% distal LMCA stenosis, small diffusely diseased distal LAD, chronically occluded OM1, and 50% mid RCA lesion. - Plan for CABG Monday. - Continue ASA 81 - Continue heparin gtt.  - Intolerance of statins, he is on Praluent at  home.  2. Acute systolic CHF: Ischemic cardiomyopathy.  Echo with EF 25-30%.  He has been on IV Lasix.  Co-ox 59% today with CVP 5.  Creatinine higher at 1.34.  - With rise in creatinine, will hold losartan.  Will try to get him on Entresto after CABG . - Continue Toprol XL 25 mg daily.  - Can stop Lasix.  - If creatinine stabilizes, start spironolactone tomorrow.  - If EF remains low after CABG, will be CRT-D candidate with LBBB.  3. AKI: Mild rise in creatinine, stop losartan and Lasix for now.  4. PVCs: Frequent.  Now on amiodarone.  - Continue amiodarone peri-operatively.   Length of Stay: 4  Loralie Champagne, MD  11/09/2019, 12:10 PM  Advanced Heart Failure Team Pager [phone] (M-F; 7a - 4p)  Please contact [facility] for night-coverage after hours (4p -7a ) and weekends on [email or web address]

## 2019-11-09 NOTE — Progress Notes (Signed)
[facility] for Heparin  Indication: chest pain/ACS  Allergies  Allergen Reactions  . Brilinta [Ticagrelor] Shortness Of Breath  . Zetia [Ezetimibe] Other (See Comments)    Muscle aches  . Statins Other (See Comments)    Failed Crestor 5 mg twice weekly, Crestor 20 mg daily, Pravastatin 40 mg qd, Lipitor, Zocor - muscle aches    Patient Measurements: Height: 5\' 6"  (167.6 cm) Weight: 77.8 kg (171 lb 8.3 oz) (scale A) IBW/kg (Calculated) : 63.8  Heparin dosing weight: 77.1 kg  Vital Signs: Temp: 97.9 F (36.6 C) (09/10 2336) Temp Source: Oral (09/10 2336) BP: 88/56 (09/10 2336) Pulse Rate: 61 (09/10 2336)  Labs: Recent Labs    11/06/19 1813 11/06/19 1813 11/07/19 0034 11/07/19 2116 11/08/19 0646 11/08/19 1910 11/09/19 0215  HGB 14.7   < >  --   --  16.4  --  15.2  HCT 44.5  --   --   --  49.4  --  45.9  PLT 236  --   --   --  241  --  221  LABPROT  --   --  12.7  --   --   --   --   INR  --   --  1.0  --   --   --   --   HEPARINUNFRC  --   --   --    < > 0.25* 0.27* 0.50  CREATININE 1.06   < > 0.98  --  1.23  --  1.34*   < > = values in this interval not displayed.    Estimated Creatinine Clearance: 51.1 mL/min (A) (by C-G formula based on SCr of 1.34 mg/dL (H)).   Assessment: 32 yom transferred from [facility] to [facility] with cath 9/8 showing left main disease, waiting on CABG post-Plavix washout. Pharmacy consulted to dose heparin.  -hepain level up to 0.5 on 1450 units/hr  Goal of Therapy:  Heparin level 0.3-0.7 units/ml Monitor platelets by anticoagulation protocol: Yes   Plan:  Continue heparin at 1450 units/hr Monitor daily heparin level and CBC, s/sx bleeding CABG planned 9/13  Hildred Laser, PharmD Clinical Pharmacist **Pharmacist phone directory can now be found on [email or web address] ([email or web address]).  Listed under [place].

## 2019-11-09 NOTE — Progress Notes (Signed)
      [address]       [place] [address]             [phone]      No complaints or concerns.  He is not having chest pain or shortness of breath.  Plan CABG Monday 9/13 following adequate Plavix washout and ABX treatment for RLL pulmonary infiltrate.  Questions regarding perioperative expectations were discussed.  Macarthur Critchley, PA-C

## 2019-11-10 ENCOUNTER — Encounter (HOSPITAL_COMMUNITY): Payer: Self-pay | Admitting: Cardiology

## 2019-11-10 LAB — BASIC METABOLIC PANEL
Anion gap: 12 (ref 5–15)
BUN: 31 mg/dL — ABNORMAL HIGH (ref 8–23)
CO2: 25 mmol/L (ref 22–32)
Calcium: 9.3 mg/dL (ref 8.9–10.3)
Chloride: 97 mmol/L — ABNORMAL LOW (ref 98–111)
Creatinine, Ser: 1.06 mg/dL (ref 0.61–1.24)
GFR calc Af Amer: >60 mL/min (ref >60)
GFR calc non Af Amer: >60 mL/min (ref >60)
Glucose, Bld: 112 mg/dL — ABNORMAL HIGH (ref 70–99)
Potassium: 4.2 mmol/L (ref 3.5–5.1)
Sodium: 134 mmol/L — ABNORMAL LOW (ref 135–145)

## 2019-11-10 LAB — COOXEMETRY PANEL
Carboxyhemoglobin: 1.2 % (ref 0.5–1.5)
Methemoglobin: 1.2 % (ref 0.0–1.5)
O2 Saturation: 63.9 %
Total hemoglobin: 16.3 g/dL — ABNORMAL HIGH (ref 12.0–16.0)

## 2019-11-10 LAB — CBC
HCT: 46.3 % (ref 39.0–52.0)
Hemoglobin: 15.3 g/dL (ref 13.0–17.0)
MCH: 30.5 pg (ref 26.0–34.0)
MCHC: 33 g/dL (ref 30.0–36.0)
MCV: 92.2 fL (ref 80.0–100.0)
Platelets: 233 10*3/uL (ref 150–400)
RBC: 5.02 MIL/uL (ref 4.22–5.81)
RDW: 12.2 % (ref 11.5–15.5)
WBC: 5.6 10*3/uL (ref 4.0–10.5)
nRBC: 0 % (ref 0.0–0.2)

## 2019-11-10 LAB — HEPARIN LEVEL (UNFRACTIONATED)
Heparin Unfractionated: >2.2 IU/mL — ABNORMAL HIGH (ref 0.30–0.70)
Heparin Unfractionated: 0.53 IU/mL (ref 0.30–0.70)

## 2019-11-10 LAB — PREPARE RBC (CROSSMATCH)

## 2019-11-10 LAB — PLATELET INHIBITION P2Y12: Platelet Function  P2Y12: 176 [PRU] — ABNORMAL LOW (ref 182–335)

## 2019-11-10 MED ORDER — CHLORHEXIDINE GLUCONATE 0.12 % MT SOLN
15.0000 mL | Freq: Once | OROMUCOSAL | Status: AC
  Administered 2019-11-11: 15 mL via OROMUCOSAL
  Filled 2019-11-10: qty 15

## 2019-11-10 MED ORDER — CHLORHEXIDINE GLUCONATE 4 % EX LIQD
60.0000 mL | Freq: Once | CUTANEOUS | Status: AC
  Administered 2019-11-11: 4 via TOPICAL
  Filled 2019-11-10: qty 60

## 2019-11-10 MED ORDER — METOPROLOL TARTRATE 12.5 MG HALF TABLET
12.5000 mg | ORAL_TABLET | Freq: Once | ORAL | Status: AC
  Administered 2019-11-11: 12.5 mg via ORAL
  Filled 2019-11-10: qty 1

## 2019-11-10 MED ORDER — MUPIROCIN 2 % EX OINT
1.0000 "application " | TOPICAL_OINTMENT | Freq: Two times a day (BID) | CUTANEOUS | Status: AC
  Administered 2019-11-10 – 2019-11-15 (×9): 1 via NASAL
  Filled 2019-11-10: qty 22

## 2019-11-10 MED ORDER — TEMAZEPAM 15 MG PO CAPS
15.0000 mg | ORAL_CAPSULE | Freq: Once | ORAL | Status: AC | PRN
  Administered 2019-11-10: 15 mg via ORAL
  Filled 2019-11-10: qty 1

## 2019-11-10 MED ORDER — BISACODYL 5 MG PO TBEC
5.0000 mg | DELAYED_RELEASE_TABLET | Freq: Once | ORAL | Status: AC
  Administered 2019-11-10: 5 mg via ORAL
  Filled 2019-11-10: qty 1

## 2019-11-10 MED ORDER — CHLORHEXIDINE GLUCONATE 4 % EX LIQD
60.0000 mL | Freq: Once | CUTANEOUS | Status: AC
  Administered 2019-11-10: 4 via TOPICAL
  Filled 2019-11-10: qty 60

## 2019-11-10 MED ORDER — SPIRONOLACTONE 12.5 MG HALF TABLET
12.5000 mg | ORAL_TABLET | Freq: Every day | ORAL | Status: DC
  Administered 2019-11-10: 12.5 mg via ORAL
  Filled 2019-11-10: qty 1

## 2019-11-10 NOTE — Progress Notes (Signed)
[facility] for Heparin  Indication: chest pain/ACS  Allergies  Allergen Reactions  . Brilinta [Ticagrelor] Shortness Of Breath  . Zetia [Ezetimibe] Other (See Comments)    Muscle aches  . Statins Other (See Comments)    Failed Crestor 5 mg twice weekly, Crestor 20 mg daily, Pravastatin 40 mg qd, Lipitor, Zocor - muscle aches    Patient Measurements: Height: 5\' 6"  (167.6 cm) Weight: 77.8 kg (171 lb 8.3 oz) IBW/kg (Calculated) : 63.8  Heparin dosing weight: 77.1 kg  Vital Signs: Temp: 97.3 F (36.3 C) (09/12 0934) Temp Source: Oral (09/12 0934) BP: 121/81 (09/12 0935) Pulse Rate: 80 (09/12 0935)  Labs: Recent Labs    11/08/19 0646 11/08/19 1910 11/09/19 0215 11/10/19 0429 11/10/19 0614  HGB 16.4  --  15.2 15.3  --   HCT 49.4  --  45.9 46.3  --   PLT 241  --  221 233  --   HEPARINUNFRC 0.25*   < > 0.50 >2.20* 0.53  CREATININE 1.23  --  1.34* 1.06  --    < > = values in this interval not displayed.    Estimated Creatinine Clearance: 64.6 mL/min (by C-G formula based on SCr of 1.06 mg/dL).   Assessment: 25 yom transferred from [facility] to [facility] with cath 9/8 showing left main disease, waiting on CABG post-Plavix washout. Pharmacy consulted to dose heparin.  Hepain level at goal again this morning at 0.53. CBC stable. No bleeding issues noted.   Goal of Therapy:  Heparin level 0.3-0.7 units/ml Monitor platelets by anticoagulation protocol: Yes   Plan:  Continue heparin at 1450 units/hr Monitor daily heparin level and CBC, s/sx bleeding CABG planned 9/13  Erin Hearing PharmD., BCPS Clinical Pharmacist 11/10/2019 9:54 AM

## 2019-11-10 NOTE — Progress Notes (Signed)
Patient ID: [name], male   DOB: October 30, 1950, 69 y.o.   MRN: [id number]     Advanced Heart Failure Rounding Note  PCP-Cardiologist: Nelva Bush, MD   Subjective:    No complaints this morning.  No chest pain or dyspnea.   CVP 4 today, co-ox 64%.    Objective:   Weight Range: 77.8 kg Body mass index is 27.68 kg/m.   Vital Signs:   Temp:  [97.5 F (36.4 C)-98.2 F (36.8 C)] 97.5 F (36.4 C) (09/12 0429) Pulse Rate:  [57-78] 57 (09/11 2021) Resp:  [15-19] 15 (09/12 0429) BP: (100-120)/(62-96) 120/62 (09/12 0429) SpO2:  [94 %-98 %] 96 % (09/12 0429) Weight:  [77.8 kg] 77.8 kg (09/12 0429) Last BM Date: 11/07/19  Weight change: Filed Weights   11/08/19 0500 11/09/19 0627 11/10/19 0429  Weight: 77.8 kg 78.3 kg 77.8 kg    Intake/Output:   Intake/Output Summary (Last 24 hours) at 11/10/2019 0931 Last data filed at 11/10/2019 0600 Gross per 24 hour  Intake 372.91 ml  Output 2050 ml  Net -1677.09 ml      Physical Exam    General: NAD Neck: No JVD, no thyromegaly or thyroid nodule.  Lungs: Clear to auscultation bilaterally with normal respiratory effort. CV: Nondisplaced PMI.  Heart regular S1/S2, no S3/S4, no murmur.  No peripheral edema.   Abdomen: Soft, nontender, no hepatosplenomegaly, no distention.  Skin: Intact without lesions or rashes.  Neurologic: Alert and oriented x 3.  Psych: Normal affect. Extremities: No clubbing or cyanosis.  HEENT: Normal.    Telemetry   NSR with LBBB, frequent PVCs (personally reviewed)  Labs    CBC Recent Labs    11/09/19 0215 11/10/19 0429  WBC 6.3 5.6  HGB 15.2 15.3  HCT 45.9 46.3  MCV 92.9 92.2  PLT 221 902   Basic Metabolic Panel Recent Labs    11/08/19 0646 11/08/19 0646 11/09/19 0215 11/10/19 0429  NA 136   < > 136 134*  K 4.7   < > 3.9 4.2  CL 98   < > 98 97*  CO2 28   < > 25 25  GLUCOSE 111*   < > 117* 112*  BUN 30*   < > 39* 31*  CREATININE 1.23   < > 1.34* 1.06  CALCIUM 10.0   < > 9.3  9.3  MG 1.9  --   --   --    < > = values in this interval not displayed.   Liver Function Tests No results for input(s): AST, ALT, ALKPHOS, BILITOT, PROT, ALBUMIN in the last 72 hours. No results for input(s): LIPASE, AMYLASE in the last 72 hours. Cardiac Enzymes No results for input(s): CKTOTAL, CKMB, CKMBINDEX, TROPONINI in the last 72 hours.  BNP: BNP (last 3 results) Recent Labs    11/05/19 2228  BNP 468.7*    ProBNP (last 3 results) No results for input(s): PROBNP in the last 8760 hours.   D-Dimer No results for input(s): DDIMER in the last 72 hours. Hemoglobin A1C No results for input(s): HGBA1C in the last 72 hours. Fasting Lipid Panel No results for input(s): CHOL, HDL, LDLCALC, TRIG, CHOLHDL, LDLDIRECT in the last 72 hours. Thyroid Function Tests No results for input(s): TSH, T4TOTAL, T3FREE, THYROIDAB in the last 72 hours.  Invalid input(s): FREET3  Other results:   Imaging    No results found.   Medications:     Scheduled Medications: . amiodarone  200 mg Oral BID  .  aspirin EC  81 mg Oral Daily  . Chlorhexidine Gluconate Cloth  6 each Topical Daily  . [START ON 11/11/2019] epinephrine  0-10 mcg/min Intravenous To OR  . [START ON 11/11/2019] heparin-papaverine-plasmalyte irrigation   Irrigation To OR  . [START ON 11/11/2019] insulin   Intravenous To OR  . [START ON 11/11/2019] magnesium sulfate  40 mEq Other To OR  . metoprolol succinate  25 mg Oral Daily  . mupirocin ointment  1 application Nasal BID  . pantoprazole  40 mg Oral Daily  . [START ON 11/11/2019] phenylephrine  30-200 mcg/min Intravenous To OR  . pneumococcal 23 valent vaccine  0.5 mL Intramuscular Tomorrow-1000  . [START ON 11/11/2019] potassium chloride  80 mEq Other To OR  . sodium chloride flush  10-40 mL Intracatheter Q12H  . sodium chloride flush  3 mL Intravenous Q12H  . spironolactone  12.5 mg Oral Daily  . [START ON 11/11/2019] tranexamic acid  15 mg/kg Intravenous To OR  .  [START ON 11/11/2019] tranexamic acid  2 mg/kg Intracatheter To OR    Infusions: . sodium chloride    . cefTRIAXone (ROCEPHIN)  IV 2 g (11/09/19 1446)  . [START ON 11/11/2019] cefUROXime (ZINACEF)  IV    . [START ON 11/11/2019] cefUROXime (ZINACEF)  IV    . [START ON 11/11/2019] dexmedetomidine    . [START ON 11/11/2019] heparin 30,000 units/NS 1000 mL solution for CELLSAVER    . heparin 1,460 Units/hr (11/10/19 0600)  . [START ON 11/11/2019] milrinone    . [START ON 11/11/2019] nitroGLYCERIN    . [START ON 11/11/2019] norepinephrine    . [START ON 11/11/2019] tranexamic acid (CYKLOKAPRON) infusion ([facility])    . [START ON 11/11/2019] vancomycin      PRN Medications: sodium chloride, acetaminophen, hydrALAZINE, labetalol, ondansetron (ZOFRAN) IV, sodium chloride flush, sodium chloride flush    Assessment/Plan   1. CAD: H/o PCI to LCX in 2017. LHC 11/05/19 with multivessel CAD, including calcified 80% distal LMCA stenosis, small diffusely diseased distal LAD, chronically occluded OM1, and 50% mid RCA lesion. - Plan for CABG Monday. - Continue ASA 81 - Continue heparin gtt.  - Intolerance of statins, he is on Praluent at home.  2. Acute systolic CHF: Ischemic cardiomyopathy.  Echo with EF 25-30%.  He has been on IV Lasix.  Co-ox 64% today with CVP 4.  Creatinine 1.06 today.  - Will try to get him on Entresto after CABG . - Continue Toprol XL 25 mg daily.  - No Lasix today.  - Start spironolactone 12.5 daily.   - If EF remains low after CABG, will be CRT-D candidate with LBBB.  3. AKI: With diuresis and losartan, now off.  Resolved.  4. PVCs: Frequent.  Now on amiodarone.  - Continue amiodarone peri-operatively.   Length of Stay: [place], MD  11/10/2019, 9:31 AM  Advanced Heart Failure Team Pager [phone] (M-F; 7a - 4p)  Please contact [facility] for night-coverage after hours (4p -7a ) and weekends on [email or web address]

## 2019-11-11 ENCOUNTER — Inpatient Hospital Stay (HOSPITAL_COMMUNITY): Payer: PPO | Admitting: Certified Registered Nurse Anesthetist

## 2019-11-11 ENCOUNTER — Ambulatory Visit: Payer: PPO | Admitting: Physician Assistant

## 2019-11-11 ENCOUNTER — Inpatient Hospital Stay (HOSPITAL_COMMUNITY): Payer: PPO

## 2019-11-11 ENCOUNTER — Inpatient Hospital Stay (HOSPITAL_COMMUNITY)
Admission: AD | Disposition: A | Discharge status: Home / Self Care | Payer: Self-pay | Source: Other Acute Inpatient Hospital | Attending: Cardiothoracic Surgery

## 2019-11-11 DIAGNOSIS — Z951 Presence of aortocoronary bypass graft: Secondary | ICD-10-CM

## 2019-11-11 HISTORY — PX: CORONARY ARTERY BYPASS GRAFT: SHX141

## 2019-11-11 HISTORY — PX: ENDOVEIN HARVEST OF GREATER SAPHENOUS VEIN: SHX5059

## 2019-11-11 HISTORY — PX: PLACEMENT OF IMPELLA LEFT VENTRICULAR ASSIST DEVICE: SHX6519

## 2019-11-11 HISTORY — PX: TEE WITHOUT CARDIOVERSION: SHX5443

## 2019-11-11 LAB — POCT I-STAT, CHEM 8
BUN: 33 mg/dL — ABNORMAL HIGH (ref 8–23)
BUN: 31 mg/dL — ABNORMAL HIGH (ref 8–23)
BUN: 33 mg/dL — ABNORMAL HIGH (ref 8–23)
BUN: 30 mg/dL — ABNORMAL HIGH (ref 8–23)
BUN: 28 mg/dL — ABNORMAL HIGH (ref 8–23)
BUN: 28 mg/dL — ABNORMAL HIGH (ref 8–23)
BUN: 27 mg/dL — ABNORMAL HIGH (ref 8–23)
Calcium, Ion: 1.26 mmol/L (ref 1.15–1.40)
Calcium, Ion: 0.97 mmol/L — ABNORMAL LOW (ref 1.15–1.40)
Calcium, Ion: 1.22 mmol/L (ref 1.15–1.40)
Calcium, Ion: 0.96 mmol/L — ABNORMAL LOW (ref 1.15–1.40)
Calcium, Ion: 0.93 mmol/L — ABNORMAL LOW (ref 1.15–1.40)
Calcium, Ion: 0.95 mmol/L — ABNORMAL LOW (ref 1.15–1.40)
Calcium, Ion: 1.1 mmol/L — ABNORMAL LOW (ref 1.15–1.40)
Chloride: 101 mmol/L (ref 98–111)
Chloride: 101 mmol/L (ref 98–111)
Chloride: 98 mmol/L (ref 98–111)
Chloride: 100 mmol/L (ref 98–111)
Chloride: 102 mmol/L (ref 98–111)
Chloride: 103 mmol/L (ref 98–111)
Chloride: 104 mmol/L (ref 98–111)
Creatinine, Ser: 1.2 mg/dL (ref 0.61–1.24)
Creatinine, Ser: 1 mg/dL (ref 0.61–1.24)
Creatinine, Ser: 1.2 mg/dL (ref 0.61–1.24)
Creatinine, Ser: 1 mg/dL (ref 0.61–1.24)
Creatinine, Ser: 1 mg/dL (ref 0.61–1.24)
Creatinine, Ser: 1 mg/dL (ref 0.61–1.24)
Creatinine, Ser: 1.1 mg/dL (ref 0.61–1.24)
Glucose, Bld: 107 mg/dL — ABNORMAL HIGH (ref 70–99)
Glucose, Bld: 106 mg/dL — ABNORMAL HIGH (ref 70–99)
Glucose, Bld: 115 mg/dL — ABNORMAL HIGH (ref 70–99)
Glucose, Bld: 129 mg/dL — ABNORMAL HIGH (ref 70–99)
Glucose, Bld: 129 mg/dL — ABNORMAL HIGH (ref 70–99)
Glucose, Bld: 139 mg/dL — ABNORMAL HIGH (ref 70–99)
Glucose, Bld: 129 mg/dL — ABNORMAL HIGH (ref 70–99)
HCT: 44 % (ref 39.0–52.0)
HCT: 29 % — ABNORMAL LOW (ref 39.0–52.0)
HCT: 42 % (ref 39.0–52.0)
HCT: 29 % — ABNORMAL LOW (ref 39.0–52.0)
HCT: 29 % — ABNORMAL LOW (ref 39.0–52.0)
HCT: 26 % — ABNORMAL LOW (ref 39.0–52.0)
HCT: 34 % — ABNORMAL LOW (ref 39.0–52.0)
Hemoglobin: 15 g/dL (ref 13.0–17.0)
Hemoglobin: 14.3 g/dL (ref 13.0–17.0)
Hemoglobin: 9.9 g/dL — ABNORMAL LOW (ref 13.0–17.0)
Hemoglobin: 9.9 g/dL — ABNORMAL LOW (ref 13.0–17.0)
Hemoglobin: 9.9 g/dL — ABNORMAL LOW (ref 13.0–17.0)
Hemoglobin: 8.8 g/dL — ABNORMAL LOW (ref 13.0–17.0)
Hemoglobin: 11.6 g/dL — ABNORMAL LOW (ref 13.0–17.0)
Potassium: 4.9 mmol/L (ref 3.5–5.1)
Potassium: 5.6 mmol/L — ABNORMAL HIGH (ref 3.5–5.1)
Potassium: 6.2 mmol/L — ABNORMAL HIGH (ref 3.5–5.1)
Potassium: 6 mmol/L — ABNORMAL HIGH (ref 3.5–5.1)
Potassium: 5.1 mmol/L (ref 3.5–5.1)
Potassium: 4.7 mmol/L (ref 3.5–5.1)
Potassium: 4.4 mmol/L (ref 3.5–5.1)
Sodium: 137 mmol/L (ref 135–145)
Sodium: 133 mmol/L — ABNORMAL LOW (ref 135–145)
Sodium: 136 mmol/L (ref 135–145)
Sodium: 132 mmol/L — ABNORMAL LOW (ref 135–145)
Sodium: 138 mmol/L (ref 135–145)
Sodium: 139 mmol/L (ref 135–145)
Sodium: 139 mmol/L (ref 135–145)
TCO2: 24 mmol/L (ref 22–32)
TCO2: 24 mmol/L (ref 22–32)
TCO2: 24 mmol/L (ref 22–32)
TCO2: 23 mmol/L (ref 22–32)
TCO2: 26 mmol/L (ref 22–32)
TCO2: 25 mmol/L (ref 22–32)
TCO2: 23 mmol/L (ref 22–32)

## 2019-11-11 LAB — POCT I-STAT 7, (LYTES, BLD GAS, ICA,H+H)
Acid-Base Excess: 2 mmol/L (ref 0.0–2.0)
Acid-Base Excess: 2 mmol/L (ref 0.0–2.0)
Acid-Base Excess: 1 mmol/L (ref 0.0–2.0)
Acid-Base Excess: 0 mmol/L (ref 0.0–2.0)
Acid-base deficit: 1 mmol/L (ref 0.0–2.0)
Acid-base deficit: 2 mmol/L (ref 0.0–2.0)
Bicarbonate: 26.6 mmol/L (ref 20.0–28.0)
Bicarbonate: 27.1 mmol/L (ref 20.0–28.0)
Bicarbonate: 26.7 mmol/L (ref 20.0–28.0)
Bicarbonate: 23.2 mmol/L (ref 20.0–28.0)
Bicarbonate: 26.4 mmol/L (ref 20.0–28.0)
Bicarbonate: 24.4 mmol/L (ref 20.0–28.0)
Calcium, Ion: 1.25 mmol/L (ref 1.15–1.40)
Calcium, Ion: 0.91 mmol/L — ABNORMAL LOW (ref 1.15–1.40)
Calcium, Ion: 0.96 mmol/L — ABNORMAL LOW (ref 1.15–1.40)
Calcium, Ion: 0.99 mmol/L — ABNORMAL LOW (ref 1.15–1.40)
Calcium, Ion: 0.96 mmol/L — ABNORMAL LOW (ref 1.15–1.40)
Calcium, Ion: 1.08 mmol/L — ABNORMAL LOW (ref 1.15–1.40)
HCT: 43 % (ref 39.0–52.0)
HCT: 30 % — ABNORMAL LOW (ref 39.0–52.0)
HCT: 30 % — ABNORMAL LOW (ref 39.0–52.0)
HCT: 32 % — ABNORMAL LOW (ref 39.0–52.0)
HCT: 29 % — ABNORMAL LOW (ref 39.0–52.0)
HCT: 26 % — ABNORMAL LOW (ref 39.0–52.0)
Hemoglobin: 14.6 g/dL (ref 13.0–17.0)
Hemoglobin: 10.2 g/dL — ABNORMAL LOW (ref 13.0–17.0)
Hemoglobin: 10.2 g/dL — ABNORMAL LOW (ref 13.0–17.0)
Hemoglobin: 10.9 g/dL — ABNORMAL LOW (ref 13.0–17.0)
Hemoglobin: 9.9 g/dL — ABNORMAL LOW (ref 13.0–17.0)
Hemoglobin: 8.8 g/dL — ABNORMAL LOW (ref 13.0–17.0)
O2 Saturation: 100 %
O2 Saturation: 100 %
O2 Saturation: 100 %
O2 Saturation: 100 %
O2 Saturation: 100 %
O2 Saturation: 100 %
Potassium: 4.9 mmol/L (ref 3.5–5.1)
Potassium: 5.7 mmol/L — ABNORMAL HIGH (ref 3.5–5.1)
Potassium: 6.7 mmol/L (ref 3.5–5.1)
Potassium: 6.1 mmol/L — ABNORMAL HIGH (ref 3.5–5.1)
Potassium: 5 mmol/L (ref 3.5–5.1)
Potassium: 4.4 mmol/L (ref 3.5–5.1)
Sodium: 137 mmol/L (ref 135–145)
Sodium: 135 mmol/L (ref 135–145)
Sodium: 130 mmol/L — ABNORMAL LOW (ref 135–145)
Sodium: 135 mmol/L (ref 135–145)
Sodium: 139 mmol/L (ref 135–145)
Sodium: 141 mmol/L (ref 135–145)
TCO2: 28 mmol/L (ref 22–32)
TCO2: 28 mmol/L (ref 22–32)
TCO2: 28 mmol/L (ref 22–32)
TCO2: 24 mmol/L (ref 22–32)
TCO2: 28 mmol/L (ref 22–32)
TCO2: 26 mmol/L (ref 22–32)
pCO2 arterial: 53.8 mmHg — ABNORMAL HIGH (ref 32.0–48.0)
pCO2 arterial: 44.4 mmHg (ref 32.0–48.0)
pCO2 arterial: 39.1 mmHg (ref 32.0–48.0)
pCO2 arterial: 38.9 mmHg (ref 32.0–48.0)
pCO2 arterial: 43.3 mmHg (ref 32.0–48.0)
pCO2 arterial: 38.6 mmHg (ref 32.0–48.0)
pH, Arterial: 7.302 — ABNORMAL LOW (ref 7.350–7.450)
pH, Arterial: 7.393 (ref 7.350–7.450)
pH, Arterial: 7.443 (ref 7.350–7.450)
pH, Arterial: 7.383 (ref 7.350–7.450)
pH, Arterial: 7.393 (ref 7.350–7.450)
pH, Arterial: 7.409 (ref 7.350–7.450)
pO2, Arterial: 233 mmHg — ABNORMAL HIGH (ref 83.0–108.0)
pO2, Arterial: 361 mmHg — ABNORMAL HIGH (ref 83.0–108.0)
pO2, Arterial: 345 mmHg — ABNORMAL HIGH (ref 83.0–108.0)
pO2, Arterial: 336 mmHg — ABNORMAL HIGH (ref 83.0–108.0)
pO2, Arterial: 374 mmHg — ABNORMAL HIGH (ref 83.0–108.0)
pO2, Arterial: 334 mmHg — ABNORMAL HIGH (ref 83.0–108.0)

## 2019-11-11 LAB — BASIC METABOLIC PANEL
Anion gap: 10 (ref 5–15)
Anion gap: 11 (ref 5–15)
BUN: 30 mg/dL — ABNORMAL HIGH (ref 8–23)
BUN: 25 mg/dL — ABNORMAL HIGH (ref 8–23)
CO2: 26 mmol/L (ref 22–32)
CO2: 22 mmol/L (ref 22–32)
Calcium: 9.3 mg/dL (ref 8.9–10.3)
Calcium: 7.8 mg/dL — ABNORMAL LOW (ref 8.9–10.3)
Chloride: 100 mmol/L (ref 98–111)
Chloride: 105 mmol/L (ref 98–111)
Creatinine, Ser: 1.34 mg/dL — ABNORMAL HIGH (ref 0.61–1.24)
Creatinine, Ser: 1.31 mg/dL — ABNORMAL HIGH (ref 0.61–1.24)
GFR calc Af Amer: >60 mL/min (ref >60)
GFR calc Af Amer: >60 mL/min (ref >60)
GFR calc non Af Amer: 54 mL/min — ABNORMAL LOW (ref >60)
GFR calc non Af Amer: 55 mL/min — ABNORMAL LOW (ref >60)
Glucose, Bld: 115 mg/dL — ABNORMAL HIGH (ref 70–99)
Glucose, Bld: 196 mg/dL — ABNORMAL HIGH (ref 70–99)
Potassium: 4.2 mmol/L (ref 3.5–5.1)
Potassium: 4 mmol/L (ref 3.5–5.1)
Sodium: 136 mmol/L (ref 135–145)
Sodium: 138 mmol/L (ref 135–145)

## 2019-11-11 LAB — CBC
HCT: 45.3 % (ref 39.0–52.0)
HCT: 29.2 % — ABNORMAL LOW (ref 39.0–52.0)
HCT: 24.8 % — ABNORMAL LOW (ref 39.0–52.0)
Hemoglobin: 14.8 g/dL (ref 13.0–17.0)
Hemoglobin: 9.6 g/dL — ABNORMAL LOW (ref 13.0–17.0)
Hemoglobin: 8.1 g/dL — ABNORMAL LOW (ref 13.0–17.0)
MCH: 30.5 pg (ref 26.0–34.0)
MCH: 31.4 pg (ref 26.0–34.0)
MCH: 31.2 pg (ref 26.0–34.0)
MCHC: 32.7 g/dL (ref 30.0–36.0)
MCHC: 32.9 g/dL (ref 30.0–36.0)
MCHC: 32.7 g/dL (ref 30.0–36.0)
MCV: 93.4 fL (ref 80.0–100.0)
MCV: 95.4 fL (ref 80.0–100.0)
MCV: 95.4 fL (ref 80.0–100.0)
Platelets: 213 10*3/uL (ref 150–400)
Platelets: 160 10*3/uL (ref 150–400)
Platelets: 155 10*3/uL (ref 150–400)
RBC: 4.85 MIL/uL (ref 4.22–5.81)
RBC: 3.06 MIL/uL — ABNORMAL LOW (ref 4.22–5.81)
RBC: 2.6 MIL/uL — ABNORMAL LOW (ref 4.22–5.81)
RDW: 12.1 % (ref 11.5–15.5)
RDW: 12.3 % (ref 11.5–15.5)
RDW: 12.4 % (ref 11.5–15.5)
WBC: 5.6 10*3/uL (ref 4.0–10.5)
WBC: 8.6 10*3/uL (ref 4.0–10.5)
WBC: 6.7 10*3/uL (ref 4.0–10.5)
nRBC: 0 % (ref 0.0–0.2)
nRBC: 0 % (ref 0.0–0.2)
nRBC: 0 % (ref 0.0–0.2)

## 2019-11-11 LAB — GLUCOSE, CAPILLARY
Glucose-Capillary: 155 mg/dL — ABNORMAL HIGH (ref 70–99)
Glucose-Capillary: 162 mg/dL — ABNORMAL HIGH (ref 70–99)
Glucose-Capillary: 167 mg/dL — ABNORMAL HIGH (ref 70–99)
Glucose-Capillary: 161 mg/dL — ABNORMAL HIGH (ref 70–99)
Glucose-Capillary: 166 mg/dL — ABNORMAL HIGH (ref 70–99)
Glucose-Capillary: 94 mg/dL (ref 70–99)
Glucose-Capillary: 200 mg/dL — ABNORMAL HIGH (ref 70–99)

## 2019-11-11 LAB — FIBRINOGEN: Fibrinogen: 295 mg/dL (ref 210–475)

## 2019-11-11 LAB — MAGNESIUM: Magnesium: 2.6 mg/dL — ABNORMAL HIGH (ref 1.7–2.4)

## 2019-11-11 LAB — COOXEMETRY PANEL
Carboxyhemoglobin: 0.7 % (ref 0.5–1.5)
Carboxyhemoglobin: 0.8 % (ref 0.5–1.5)
Methemoglobin: 0.9 % (ref 0.0–1.5)
Methemoglobin: 1.1 % (ref 0.0–1.5)
O2 Saturation: 63.6 %
O2 Saturation: 83.6 %
Total hemoglobin: 15.6 g/dL (ref 12.0–16.0)
Total hemoglobin: 10.6 g/dL — ABNORMAL LOW (ref 12.0–16.0)

## 2019-11-11 LAB — PLATELET COUNT: Platelets: 172 10*3/uL (ref 150–400)

## 2019-11-11 LAB — HEPARIN LEVEL (UNFRACTIONATED): Heparin Unfractionated: 0.45 IU/mL (ref 0.30–0.70)

## 2019-11-11 LAB — PROTIME-INR
INR: 1.4 — ABNORMAL HIGH (ref 0.8–1.2)
Prothrombin Time: 16.2 seconds — ABNORMAL HIGH (ref 11.4–15.2)

## 2019-11-11 LAB — APTT: aPTT: 28 seconds (ref 24–36)

## 2019-11-11 LAB — HEMOGLOBIN AND HEMATOCRIT, BLOOD
HCT: 31.8 % — ABNORMAL LOW (ref 39.0–52.0)
Hemoglobin: 10.5 g/dL — ABNORMAL LOW (ref 13.0–17.0)

## 2019-11-11 LAB — POTASSIUM: Potassium: 6 mmol/L — ABNORMAL HIGH (ref 3.5–5.1)

## 2019-11-11 SURGERY — CORONARY ARTERY BYPASS GRAFTING (CABG)
Anesthesia: General | Site: Leg Upper | Laterality: Right

## 2019-11-11 MED ORDER — BISACODYL 5 MG PO TBEC
10.0000 mg | DELAYED_RELEASE_TABLET | Freq: Every day | ORAL | Status: DC
  Administered 2019-11-12 – 2019-11-19 (×7): 10 mg via ORAL
  Filled 2019-11-11 (×7): qty 2

## 2019-11-11 MED ORDER — ASPIRIN EC 325 MG PO TBEC
325.0000 mg | DELAYED_RELEASE_TABLET | Freq: Every day | ORAL | Status: DC
  Administered 2019-11-12 – 2019-11-19 (×8): 325 mg via ORAL
  Filled 2019-11-11 (×8): qty 1

## 2019-11-11 MED ORDER — MIDAZOLAM HCL 2 MG/2ML IJ SOLN
INTRAMUSCULAR | Status: AC
  Filled 2019-11-11: qty 2

## 2019-11-11 MED ORDER — ACETAMINOPHEN 500 MG PO TABS
1000.0000 mg | ORAL_TABLET | Freq: Four times a day (QID) | ORAL | Status: AC
  Administered 2019-11-11 – 2019-11-16 (×18): 1000 mg via ORAL
  Filled 2019-11-11 (×18): qty 2

## 2019-11-11 MED ORDER — OXYCODONE HCL 5 MG PO TABS
5.0000 mg | ORAL_TABLET | ORAL | Status: DC | PRN
  Administered 2019-11-11 – 2019-11-19 (×16): 10 mg via ORAL
  Filled 2019-11-11 (×16): qty 2

## 2019-11-11 MED ORDER — CHLORHEXIDINE GLUCONATE CLOTH 2 % EX PADS
6.0000 | MEDICATED_PAD | Freq: Every day | CUTANEOUS | Status: DC
  Administered 2019-11-11 – 2019-11-12 (×2): 6 via TOPICAL

## 2019-11-11 MED ORDER — LACTATED RINGERS IV SOLN: INTRAVENOUS | Status: DC

## 2019-11-11 MED ORDER — DOCUSATE SODIUM 100 MG PO CAPS
200.0000 mg | ORAL_CAPSULE | Freq: Every day | ORAL | Status: DC
  Administered 2019-11-12 – 2019-11-21 (×9): 200 mg via ORAL
  Filled 2019-11-11 (×9): qty 2

## 2019-11-11 MED ORDER — ALBUMIN HUMAN 5 % IV SOLN
250.0000 mL | INTRAVENOUS | Status: AC | PRN
  Administered 2019-11-11 (×3): 12.5 g via INTRAVENOUS
  Filled 2019-11-11: qty 250

## 2019-11-11 MED ORDER — MORPHINE SULFATE (PF) 2 MG/ML IV SOLN
1.0000 mg | INTRAVENOUS | Status: DC | PRN
  Administered 2019-11-11: 2 mg via INTRAVENOUS
  Administered 2019-11-11: 4 mg via INTRAVENOUS
  Administered 2019-11-11 – 2019-11-12 (×2): 2 mg via INTRAVENOUS
  Administered 2019-11-12 (×2): 4 mg via INTRAVENOUS
  Administered 2019-11-12: 2 mg via INTRAVENOUS
  Administered 2019-11-12: 4 mg via INTRAVENOUS
  Administered 2019-11-12: 2 mg via INTRAVENOUS
  Administered 2019-11-12 – 2019-11-14 (×2): 4 mg via INTRAVENOUS
  Administered 2019-11-15 – 2019-11-16 (×3): 2 mg via INTRAVENOUS
  Filled 2019-11-11: qty 1
  Filled 2019-11-11: qty 2
  Filled 2019-11-11: qty 1
  Filled 2019-11-11 (×2): qty 2
  Filled 2019-11-11 (×2): qty 1
  Filled 2019-11-11: qty 2
  Filled 2019-11-11: qty 1
  Filled 2019-11-11: qty 2
  Filled 2019-11-11: qty 1
  Filled 2019-11-11: qty 2
  Filled 2019-11-11: qty 1

## 2019-11-11 MED ORDER — FENTANYL CITRATE (PF) 250 MCG/5ML IJ SOLN
INTRAMUSCULAR | Status: DC | PRN
Start: 2019-11-11 — End: 2019-11-11
  Administered 2019-11-11: 100 ug via INTRAVENOUS
  Administered 2019-11-11: 50 ug via INTRAVENOUS
  Administered 2019-11-11: 300 ug via INTRAVENOUS
  Administered 2019-11-11: 100 ug via INTRAVENOUS
  Administered 2019-11-11: 50 ug via INTRAVENOUS
  Administered 2019-11-11: 400 ug via INTRAVENOUS

## 2019-11-11 MED ORDER — PROPOFOL 10 MG/ML IV BOLUS
INTRAVENOUS | Status: AC
  Filled 2019-11-11: qty 20

## 2019-11-11 MED ORDER — VASOPRESSIN 20 UNIT/ML IV SOLN
INTRAVENOUS | Status: AC
  Filled 2019-11-11: qty 1

## 2019-11-11 MED ORDER — HEMOSTATIC AGENTS (NO CHARGE) OPTIME
TOPICAL | Status: DC | PRN
  Administered 2019-11-11 (×9): 1 via TOPICAL

## 2019-11-11 MED ORDER — NITROGLYCERIN IN D5W 200-5 MCG/ML-% IV SOLN: 0.0000 ug/min | INTRAVENOUS | Status: DC

## 2019-11-11 MED ORDER — METOPROLOL TARTRATE 25 MG/10 ML ORAL SUSPENSION: 12.5000 mg | Freq: Two times a day (BID) | ORAL | Status: DC

## 2019-11-11 MED ORDER — ONDANSETRON HCL 4 MG/2ML IJ SOLN
4.0000 mg | Freq: Four times a day (QID) | INTRAMUSCULAR | Status: DC | PRN
  Administered 2019-11-14 – 2019-11-20 (×5): 4 mg via INTRAVENOUS
  Filled 2019-11-11 (×4): qty 2

## 2019-11-11 MED ORDER — SODIUM CHLORIDE (PF) 0.9 % IJ SOLN
INTRAMUSCULAR | Status: AC
  Filled 2019-11-11: qty 20

## 2019-11-11 MED ORDER — ROCURONIUM BROMIDE 10 MG/ML (PF) SYRINGE
PREFILLED_SYRINGE | INTRAVENOUS | Status: DC | PRN
  Administered 2019-11-11: 60 mg via INTRAVENOUS
  Administered 2019-11-11 (×3): 50 mg via INTRAVENOUS
  Administered 2019-11-11: 20 mg via INTRAVENOUS
  Administered 2019-11-11: 90 mg via INTRAVENOUS

## 2019-11-11 MED ORDER — HEPARIN SODIUM (PORCINE) 1000 UNIT/ML IJ SOLN
INTRAMUSCULAR | Status: AC
  Filled 2019-11-11: qty 1

## 2019-11-11 MED ORDER — LIDOCAINE 2% (20 MG/ML) 5 ML SYRINGE
INTRAMUSCULAR | Status: AC
  Filled 2019-11-11: qty 5

## 2019-11-11 MED ORDER — LACTATED RINGERS IV SOLN: INTRAVENOUS | Status: DC | PRN

## 2019-11-11 MED ORDER — FAMOTIDINE IN NACL 20-0.9 MG/50ML-% IV SOLN
20.0000 mg | Freq: Two times a day (BID) | INTRAVENOUS | Status: AC
  Administered 2019-11-12 (×2): 20 mg via INTRAVENOUS
  Filled 2019-11-11 (×2): qty 50

## 2019-11-11 MED ORDER — PROTAMINE SULFATE 10 MG/ML IV SOLN
INTRAVENOUS | Status: AC
  Filled 2019-11-11: qty 25

## 2019-11-11 MED ORDER — SODIUM CHLORIDE 0.9 % IV SOLN: INTRAVENOUS | Status: DC

## 2019-11-11 MED ORDER — SODIUM CHLORIDE 0.9 % IV SOLN
1.5000 g | Freq: Two times a day (BID) | INTRAVENOUS | Status: AC
  Administered 2019-11-11 – 2019-11-13 (×4): 1.5 g via INTRAVENOUS
  Filled 2019-11-11 (×4): qty 1.5

## 2019-11-11 MED ORDER — DEXTROSE 5 % SOLN FOR IMPELLA PURGE CATHETER
INTRAVENOUS | Status: DC
  Filled 2019-11-11: qty 1000

## 2019-11-11 MED ORDER — HEPARIN SODIUM (PORCINE) 5000 UNIT/ML IJ SOLN
50000.0000 [IU] | INTRAVENOUS | Status: DC
  Administered 2019-11-11 – 2019-11-13 (×2): 50000 [IU]
  Filled 2019-11-11 (×2): qty 10

## 2019-11-11 MED ORDER — PROTAMINE SULFATE 10 MG/ML IV SOLN
INTRAVENOUS | Status: DC | PRN
  Administered 2019-11-11: 10 mg via INTRAVENOUS
  Administered 2019-11-11: 230 mg via INTRAVENOUS
  Administered 2019-11-11: 10 mg via INTRAVENOUS

## 2019-11-11 MED ORDER — POTASSIUM CHLORIDE 10 MEQ/50ML IV SOLN: 10.0000 meq | INTRAVENOUS | Status: AC

## 2019-11-11 MED ORDER — PANTOPRAZOLE SODIUM 40 MG PO TBEC
40.0000 mg | DELAYED_RELEASE_TABLET | Freq: Every day | ORAL | Status: DC
  Administered 2019-11-13 – 2019-11-21 (×9): 40 mg via ORAL
  Filled 2019-11-11 (×9): qty 1

## 2019-11-11 MED ORDER — PHENYLEPHRINE HCL-NACL 20-0.9 MG/250ML-% IV SOLN: 0.0000 ug/min | INTRAVENOUS | Status: DC

## 2019-11-11 MED ORDER — SODIUM CHLORIDE 0.9% FLUSH
10.0000 mL | INTRAVENOUS | Status: DC | PRN
  Administered 2019-11-13: 10 mL

## 2019-11-11 MED ORDER — BISACODYL 10 MG RE SUPP: 10.0000 mg | Freq: Every day | RECTAL | Status: DC

## 2019-11-11 MED ORDER — CHLORHEXIDINE GLUCONATE CLOTH 2 % EX PADS: 6.0000 | MEDICATED_PAD | Freq: Every day | CUTANEOUS | Status: DC

## 2019-11-11 MED ORDER — MIDAZOLAM HCL (PF) 10 MG/2ML IJ SOLN
INTRAMUSCULAR | Status: AC
  Filled 2019-11-11: qty 2

## 2019-11-11 MED ORDER — METOPROLOL TARTRATE 5 MG/5ML IV SOLN
2.5000 mg | INTRAVENOUS | Status: DC | PRN
  Administered 2019-11-14: 2.5 mg via INTRAVENOUS
  Filled 2019-11-11: qty 5

## 2019-11-11 MED ORDER — FENTANYL CITRATE (PF) 250 MCG/5ML IJ SOLN
INTRAMUSCULAR | Status: AC
  Filled 2019-11-11: qty 25

## 2019-11-11 MED ORDER — MAGNESIUM SULFATE 4 GM/100ML IV SOLN
4.0000 g | Freq: Once | INTRAVENOUS | Status: AC
  Administered 2019-11-11: 4 g via INTRAVENOUS

## 2019-11-11 MED ORDER — NOREPINEPHRINE 4 MG/250ML-% IV SOLN
0.0000 ug/min | INTRAVENOUS | Status: DC
  Administered 2019-11-12: 5 ug/min via INTRAVENOUS
  Administered 2019-11-14: 4 ug/min via INTRAVENOUS
  Administered 2019-11-14: 3 ug/min via INTRAVENOUS
  Administered 2019-11-14: 4 ug/min via INTRAVENOUS
  Filled 2019-11-11 (×2): qty 250

## 2019-11-11 MED ORDER — EPINEPHRINE HCL 5 MG/250ML IV SOLN IN NS
0.5000 ug/min | INTRAVENOUS | Status: DC
  Administered 2019-11-13: 04:00:00 1 ug/min via INTRAVENOUS
  Filled 2019-11-11: qty 250

## 2019-11-11 MED ORDER — INSULIN REGULAR(HUMAN) IN NACL 100-0.9 UT/100ML-% IV SOLN: INTRAVENOUS | Status: DC

## 2019-11-11 MED ORDER — SODIUM CHLORIDE 0.9 % IV SOLN
20.0000 ug | Freq: Once | INTRAVENOUS | Status: AC
  Administered 2019-11-11: 20 ug via INTRAVENOUS
  Filled 2019-11-11: qty 5

## 2019-11-11 MED ORDER — SODIUM CHLORIDE 0.45 % IV SOLN: INTRAVENOUS | Status: DC | PRN

## 2019-11-11 MED ORDER — PLASMA-LYTE 148 IV SOLN
INTRAVENOUS | Status: DC | PRN
  Administered 2019-11-11: 500 mL via INTRAVASCULAR

## 2019-11-11 MED ORDER — ACETAMINOPHEN 160 MG/5ML PO SOLN: 1000.0000 mg | Freq: Four times a day (QID) | ORAL | Status: DC

## 2019-11-11 MED ORDER — ROCURONIUM BROMIDE 10 MG/ML (PF) SYRINGE
PREFILLED_SYRINGE | INTRAVENOUS | Status: AC
  Filled 2019-11-11: qty 10

## 2019-11-11 MED ORDER — CHLORHEXIDINE GLUCONATE 0.12 % MT SOLN
15.0000 mL | OROMUCOSAL | Status: AC
  Administered 2019-11-11: 15 mL via OROMUCOSAL

## 2019-11-11 MED ORDER — TRAMADOL HCL 50 MG PO TABS
50.0000 mg | ORAL_TABLET | ORAL | Status: DC | PRN
  Administered 2019-11-12 – 2019-11-19 (×4): 50 mg via ORAL
  Administered 2019-11-19: 100 mg via ORAL
  Administered 2019-11-20: 50 mg via ORAL
  Filled 2019-11-11 (×3): qty 1
  Filled 2019-11-11: qty 2
  Filled 2019-11-11 (×2): qty 1

## 2019-11-11 MED ORDER — SODIUM CHLORIDE 0.9% IV SOLUTION: Freq: Once | INTRAVENOUS | Status: DC

## 2019-11-11 MED ORDER — ARTIFICIAL TEARS OPHTHALMIC OINT
TOPICAL_OINTMENT | OPHTHALMIC | Status: DC | PRN
  Administered 2019-11-11: 1 via OPHTHALMIC

## 2019-11-11 MED ORDER — 0.9 % SODIUM CHLORIDE (POUR BTL) OPTIME
TOPICAL | Status: DC | PRN
  Administered 2019-11-11: 5000 mL
  Administered 2019-11-11: 1000 mL

## 2019-11-11 MED ORDER — ACETAMINOPHEN 650 MG RE SUPP
650.0000 mg | Freq: Once | RECTAL | Status: AC
  Administered 2019-11-11: 650 mg via RECTAL

## 2019-11-11 MED ORDER — CHLORHEXIDINE GLUCONATE 0.12% ORAL RINSE (MEDLINE KIT)
15.0000 mL | Freq: Two times a day (BID) | OROMUCOSAL | Status: DC
  Administered 2019-11-11 – 2019-11-12 (×3): 15 mL via OROMUCOSAL

## 2019-11-11 MED ORDER — LACTATED RINGERS IV SOLN: 500.0000 mL | Freq: Once | INTRAVENOUS | Status: DC | PRN

## 2019-11-11 MED ORDER — DEXTROSE 50 % IV SOLN: 0.0000 mL | INTRAVENOUS | Status: DC | PRN

## 2019-11-11 MED ORDER — SODIUM CHLORIDE 0.9 % IV SOLN: 250.0000 mL | INTRAVENOUS | Status: DC

## 2019-11-11 MED ORDER — ALBUMIN HUMAN 5 % IV SOLN: INTRAVENOUS | Status: DC | PRN

## 2019-11-11 MED ORDER — EPHEDRINE SULFATE 50 MG/ML IJ SOLN
INTRAMUSCULAR | Status: DC | PRN
  Administered 2019-11-11 (×2): 5 mg via INTRAVENOUS

## 2019-11-11 MED ORDER — HEPARIN SODIUM (PORCINE) 1000 UNIT/ML IJ SOLN
INTRAMUSCULAR | Status: DC | PRN
  Administered 2019-11-11: 23000 [IU] via INTRAVENOUS
  Administered 2019-11-11: 2000 [IU] via INTRAVENOUS
  Administered 2019-11-11: 5000 [IU] via INTRAVENOUS

## 2019-11-11 MED ORDER — SODIUM CHLORIDE 0.9% FLUSH
3.0000 mL | Freq: Two times a day (BID) | INTRAVENOUS | Status: DC
  Administered 2019-11-12 – 2019-11-16 (×8): 3 mL via INTRAVENOUS
  Administered 2019-11-16: 10 mL via INTRAVENOUS
  Administered 2019-11-17 – 2019-11-20 (×6): 3 mL via INTRAVENOUS

## 2019-11-11 MED ORDER — SODIUM CHLORIDE 0.9% FLUSH
10.0000 mL | Freq: Two times a day (BID) | INTRAVENOUS | Status: DC
  Administered 2019-11-11: 10 mL
  Administered 2019-11-12: 40 mL
  Administered 2019-11-12 – 2019-11-14 (×4): 10 mL

## 2019-11-11 MED ORDER — DEXMEDETOMIDINE HCL IN NACL 400 MCG/100ML IV SOLN
0.0000 ug/kg/h | INTRAVENOUS | Status: DC
  Administered 2019-11-11 – 2019-11-12 (×3): 0.7 ug/kg/h via INTRAVENOUS
  Filled 2019-11-11 (×3): qty 100

## 2019-11-11 MED ORDER — PROPOFOL 10 MG/ML IV BOLUS
INTRAVENOUS | Status: DC | PRN
  Administered 2019-11-11: 30 mg via INTRAVENOUS

## 2019-11-11 MED ORDER — METOPROLOL TARTRATE 12.5 MG HALF TABLET
12.5000 mg | ORAL_TABLET | Freq: Two times a day (BID) | ORAL | Status: DC
  Administered 2019-11-11: 12.5 mg via ORAL
  Filled 2019-11-11: qty 1

## 2019-11-11 MED ORDER — ACETAMINOPHEN 160 MG/5ML PO SOLN: 650.0000 mg | Freq: Once | ORAL | Status: AC

## 2019-11-11 MED ORDER — VANCOMYCIN HCL IN DEXTROSE 1-5 GM/200ML-% IV SOLN
1000.0000 mg | Freq: Once | INTRAVENOUS | Status: AC
  Administered 2019-11-11: 1000 mg via INTRAVENOUS
  Filled 2019-11-11: qty 200

## 2019-11-11 MED ORDER — MIDAZOLAM HCL 5 MG/5ML IJ SOLN
INTRAMUSCULAR | Status: DC | PRN
  Administered 2019-11-11: 2 mg via INTRAVENOUS
  Administered 2019-11-11 (×2): 1 mg via INTRAVENOUS
  Administered 2019-11-11 (×2): 2 mg via INTRAVENOUS

## 2019-11-11 MED ORDER — ASPIRIN 81 MG PO CHEW: 324.0000 mg | CHEWABLE_TABLET | Freq: Every day | ORAL | Status: DC

## 2019-11-11 MED ORDER — MILRINONE LACTATE IN DEXTROSE 20-5 MG/100ML-% IV SOLN
0.2500 ug/kg/min | INTRAVENOUS | Status: DC
  Administered 2019-11-11 – 2019-11-14 (×4): 0.25 ug/kg/min via INTRAVENOUS
  Filled 2019-11-11 (×4): qty 100

## 2019-11-11 MED ORDER — MORPHINE SULFATE (PF) 2 MG/ML IV SOLN
INTRAVENOUS | Status: AC
Start: 2019-11-11 — End: 2019-11-12
  Filled 2019-11-11: qty 1

## 2019-11-11 MED ORDER — HEPARIN SODIUM (PORCINE) 5000 UNIT/ML IJ SOLN
50000.0000 [IU] | INTRAVENOUS | Status: AC
  Administered 2019-11-11: 50000 [IU]
  Filled 2019-11-11: qty 10

## 2019-11-11 MED ORDER — ORAL CARE MOUTH RINSE
15.0000 mL | OROMUCOSAL | Status: DC
  Administered 2019-11-11 – 2019-11-12 (×7): 15 mL via OROMUCOSAL

## 2019-11-11 MED ORDER — SODIUM CHLORIDE 0.9% FLUSH: 3.0000 mL | INTRAVENOUS | Status: DC | PRN

## 2019-11-11 MED ORDER — PHENYLEPHRINE HCL (PRESSORS) 10 MG/ML IV SOLN
INTRAVENOUS | Status: DC | PRN
  Administered 2019-11-11 (×2): 40 ug via INTRAVENOUS
  Administered 2019-11-11 (×2): 80 ug via INTRAVENOUS

## 2019-11-11 MED ORDER — PHENYLEPHRINE 40 MCG/ML (10ML) SYRINGE FOR IV PUSH (FOR BLOOD PRESSURE SUPPORT)
PREFILLED_SYRINGE | INTRAVENOUS | Status: AC
  Filled 2019-11-11: qty 10

## 2019-11-11 MED ORDER — MIDAZOLAM HCL 2 MG/2ML IJ SOLN
2.0000 mg | INTRAMUSCULAR | Status: DC | PRN
  Administered 2019-11-11 – 2019-11-12 (×6): 2 mg via INTRAVENOUS
  Filled 2019-11-11 (×5): qty 2

## 2019-11-11 SURGICAL SUPPLY — 132 items
ADAPTER CARDIO PERF ANTE/RETRO (ADAPTER) ×5 IMPLANT
ADH SKN CLS APL DERMABOND .7 (GAUZE/BANDAGES/DRESSINGS) ×3
ADPR PRFSN 84XANTGRD RTRGD (ADAPTER) ×3
AGENT HMST KT MTR STRL THRMB (HEMOSTASIS) ×3
ANCHOR CATH FOLEY SECURE (MISCELLANEOUS) ×6 IMPLANT
APL SRG 7X2 LUM MLBL SLNT (VASCULAR PRODUCTS)
APPLICATOR TIP COSEAL (VASCULAR PRODUCTS) IMPLANT
BAG DECANTER FOR FLEXI CONT (MISCELLANEOUS) ×5 IMPLANT
BLADE CLIPPER SURG (BLADE) ×3 IMPLANT
BLADE STERNUM SYSTEM 6 (BLADE) ×5 IMPLANT
BLADE SURG 12 STRL SS (BLADE) ×5 IMPLANT
BLOOD HAEMOCONCENTR 700 MIDI (MISCELLANEOUS) ×2 IMPLANT
BNDG ELASTIC 4X5.8 VLCR STR LF (GAUZE/BANDAGES/DRESSINGS) ×5 IMPLANT
BNDG ELASTIC 6X5.8 VLCR STR LF (GAUZE/BANDAGES/DRESSINGS) ×5 IMPLANT
BNDG GAUZE ELAST 4 BULKY (GAUZE/BANDAGES/DRESSINGS) ×5 IMPLANT
CANISTER SUCT 3000ML PPV (MISCELLANEOUS) ×5 IMPLANT
CANNULA GUNDRY RCSP 15FR (MISCELLANEOUS) ×5 IMPLANT
CATH CPB KIT VANTRIGT (MISCELLANEOUS) ×5 IMPLANT
CATH DIAG EXPO 6F AL1 (CATHETERS) ×3 IMPLANT
CATH INFINITI 6F MPB2 (CATHETERS) ×3 IMPLANT
CATH ROBINSON RED A/P 18FR (CATHETERS) ×17 IMPLANT
CATH THORACIC 28FR RT ANG (CATHETERS) ×7 IMPLANT
CLIP FOGARTY SPRING 6M (CLIP) ×2 IMPLANT
DERMABOND ADVANCED (GAUZE/BANDAGES/DRESSINGS) ×2
DERMABOND ADVANCED .7 DNX12 (GAUZE/BANDAGES/DRESSINGS) IMPLANT
DRAIN CHANNEL 32F RND 10.7 FF (WOUND CARE) ×5 IMPLANT
DRAPE C-ARM 42X72 X-RAY (DRAPES) ×6 IMPLANT
DRAPE CARDIOVASCULAR INCISE (DRAPES) ×5
DRAPE CV SPLIT W-CLR ANES SCRN (DRAPES) ×3 IMPLANT
DRAPE PERI GROIN 82X75IN TIB (DRAPES) ×3 IMPLANT
DRAPE SLUSH/WARMER DISC (DRAPES) ×5 IMPLANT
DRAPE SRG 135X102X78XABS (DRAPES) ×3 IMPLANT
DRSG AQUACEL AG ADV 3.5X14 (GAUZE/BANDAGES/DRESSINGS) ×5 IMPLANT
ELECT BLADE 4.0 EZ CLEAN MEGAD (MISCELLANEOUS) ×5
ELECT BLADE 6.5 EXT (BLADE) ×5 IMPLANT
ELECT CAUTERY BLADE 6.4 (BLADE) ×5 IMPLANT
ELECT REM PT RETURN 9FT ADLT (ELECTROSURGICAL) ×10
ELECTRODE BLDE 4.0 EZ CLN MEGD (MISCELLANEOUS) ×3 IMPLANT
ELECTRODE REM PT RTRN 9FT ADLT (ELECTROSURGICAL) ×6 IMPLANT
FELT TEFLON 1X6 (MISCELLANEOUS) ×8 IMPLANT
GAUZE SPONGE 4X4 12PLY STRL (GAUZE/BANDAGES/DRESSINGS) ×10 IMPLANT
GAUZE SPONGE 4X4 12PLY STRL LF (GAUZE/BANDAGES/DRESSINGS) ×4 IMPLANT
GAUZE XEROFORM 1X8 LF (GAUZE/BANDAGES/DRESSINGS) ×2 IMPLANT
GLOVE BIO SURGEON STRL SZ 6.5 (GLOVE) ×4 IMPLANT
GLOVE BIO SURGEON STRL SZ7.5 (GLOVE) ×17 IMPLANT
GLOVE BIO SURGEONS STRL SZ 6.5 (GLOVE) ×4
GLOVE BIOGEL M STRL SZ7.5 (GLOVE) ×9 IMPLANT
GLOVE BIOGEL PI IND STRL 6 (GLOVE) IMPLANT
GLOVE BIOGEL PI IND STRL 6.5 (GLOVE) IMPLANT
GLOVE BIOGEL PI IND STRL 7.5 (GLOVE) IMPLANT
GLOVE BIOGEL PI INDICATOR 6 (GLOVE) ×10
GLOVE BIOGEL PI INDICATOR 6.5 (GLOVE) ×4
GLOVE BIOGEL PI INDICATOR 7.5 (GLOVE) ×12
GOWN STRL REUS W/ TWL LRG LVL3 (GOWN DISPOSABLE) ×12 IMPLANT
GOWN STRL REUS W/TWL LRG LVL3 (GOWN DISPOSABLE) ×60
GRAFT GELWEAVE IMPREG 10X30CM (Prosthesis & Implant Heart) ×2 IMPLANT
HEMOSTAT POWDER SURGIFOAM 1G (HEMOSTASIS) ×17 IMPLANT
HEMOSTAT SURGICEL 2X14 (HEMOSTASIS) ×5 IMPLANT
INSERT FOGARTY SM (MISCELLANEOUS) ×5 IMPLANT
INSERT FOGARTY XLG (MISCELLANEOUS) IMPLANT
KIT BASIN OR (CUSTOM PROCEDURE TRAY) ×5 IMPLANT
KIT SUCTION CATH 14FR (SUCTIONS) ×5 IMPLANT
KIT TURNOVER KIT B (KITS) ×5 IMPLANT
KIT VASOVIEW HEMOPRO 2 VH 4000 (KITS) ×5 IMPLANT
LEAD PACING MYOCARDI (MISCELLANEOUS) ×5 IMPLANT
LINE EXTENSION DELIVERY (MISCELLANEOUS) ×2 IMPLANT
LOOP VESSEL MINI RED (MISCELLANEOUS) ×3 IMPLANT
MARKER GRAFT CORONARY BYPASS (MISCELLANEOUS) ×15 IMPLANT
NS IRRIG 1000ML POUR BTL (IV SOLUTION) ×27 IMPLANT
PACK CHEST (CUSTOM PROCEDURE TRAY) ×3 IMPLANT
PACK E OPEN HEART (SUTURE) ×5 IMPLANT
PACK OPEN HEART (CUSTOM PROCEDURE TRAY) ×5 IMPLANT
PAD ARMBOARD 7.5X6 YLW CONV (MISCELLANEOUS) ×10 IMPLANT
PAD ELECT DEFIB RADIOL ZOLL (MISCELLANEOUS) ×5 IMPLANT
PENCIL BUTTON HOLSTER BLD 10FT (ELECTRODE) ×5 IMPLANT
POSITIONER HEAD DONUT 9IN (MISCELLANEOUS) ×5 IMPLANT
POWDER SURGICEL 3.0 GRAM (HEMOSTASIS) ×2 IMPLANT
PUMP SET IMPELLA 5.5 US (CATHETERS) ×5 IMPLANT
PUNCH AORTIC ROTATE 4.0MM (MISCELLANEOUS) IMPLANT
PUNCH AORTIC ROTATE 4.5MM 8IN (MISCELLANEOUS) ×2 IMPLANT
PUNCH AORTIC ROTATE 5MM 8IN (MISCELLANEOUS) IMPLANT
SEALANT PATCH FIBRIN 2X4IN (MISCELLANEOUS) ×2 IMPLANT
SEALANT SURG COSEAL 8ML (VASCULAR PRODUCTS) ×5 IMPLANT
SET CARDIOPLEGIA MPS 5001102 (MISCELLANEOUS) ×2 IMPLANT
SPONGE LAP 18X18 RF (DISPOSABLE) ×2 IMPLANT
SPONGE LAP 4X18 RFD (DISPOSABLE) ×2 IMPLANT
SUPPORT HEART JANKE-BARRON (MISCELLANEOUS) ×5 IMPLANT
SURGIFLO W/THROMBIN 8M KIT (HEMOSTASIS) ×5 IMPLANT
SUT BONE WAX W31G (SUTURE) ×5 IMPLANT
SUT ETHILON 3 0 PS 1 (SUTURE) ×6 IMPLANT
SUT MNCRL AB 4-0 PS2 18 (SUTURE) IMPLANT
SUT PROLENE 3 0 SH DA (SUTURE) IMPLANT
SUT PROLENE 3 0 SH1 36 (SUTURE) IMPLANT
SUT PROLENE 4 0 RB 1 (SUTURE) ×20
SUT PROLENE 4 0 SH DA (SUTURE) ×5 IMPLANT
SUT PROLENE 4-0 RB1 .5 CRCL 36 (SUTURE) ×3 IMPLANT
SUT PROLENE 5 0 C 1 36 (SUTURE) ×12 IMPLANT
SUT PROLENE 6 0 C 1 30 (SUTURE) ×12 IMPLANT
SUT PROLENE 6 0 CC (SUTURE) ×19 IMPLANT
SUT PROLENE 8 0 BV175 6 (SUTURE) ×16 IMPLANT
SUT PROLENE BLUE 7 0 (SUTURE) ×5 IMPLANT
SUT PROLENE POLY MONO (SUTURE) ×4 IMPLANT
SUT SILK  1 MH (SUTURE) ×5
SUT SILK 1 MH (SUTURE) ×12 IMPLANT
SUT SILK 1 TIES 10X30 (SUTURE) ×5 IMPLANT
SUT SILK 2 0 SH CR/8 (SUTURE) ×2 IMPLANT
SUT SILK 3 0 SH CR/8 (SUTURE) IMPLANT
SUT STEEL 6MS V (SUTURE) ×8 IMPLANT
SUT STEEL SZ 6 DBL 3X14 BALL (SUTURE) ×5 IMPLANT
SUT VIC AB 1 CTX 36 (SUTURE) ×10
SUT VIC AB 1 CTX36XBRD ANBCTR (SUTURE) ×6 IMPLANT
SUT VIC AB 2-0 CT1 27 (SUTURE) ×5
SUT VIC AB 2-0 CT1 TAPERPNT 27 (SUTURE) IMPLANT
SUT VIC AB 2-0 CTX 27 (SUTURE) IMPLANT
SUT VIC AB 3-0 X1 27 (SUTURE) ×4 IMPLANT
SYR BULB IRRIG 60ML STRL (SYRINGE) ×2 IMPLANT
SYR TOOMEY 50ML (SYRINGE) ×2 IMPLANT
SYSTEM SAHARA CHEST DRAIN ATS (WOUND CARE) ×5 IMPLANT
TABLE PACK (MISCELLANEOUS) ×2 IMPLANT
TAPE CLOTH SURG 4X10 WHT LF (GAUZE/BANDAGES/DRESSINGS) ×2 IMPLANT
TAPE PAPER 2X10 WHT MICROPORE (GAUZE/BANDAGES/DRESSINGS) ×2 IMPLANT
TOWEL GREEN STERILE (TOWEL DISPOSABLE) ×5 IMPLANT
TOWEL GREEN STERILE FF (TOWEL DISPOSABLE) ×5 IMPLANT
TRAY FOLEY SLVR 16FR TEMP STAT (SET/KITS/TRAYS/PACK) ×5 IMPLANT
TUBE CONNECTING 12'X1/4 (SUCTIONS) ×1
TUBE CONNECTING 12X1/4 (SUCTIONS) ×1 IMPLANT
TUBE CONNECTING 20'X1/4 (TUBING) ×1
TUBE CONNECTING 20X1/4 (TUBING) ×1 IMPLANT
TUBING LAP HI FLOW INSUFFLATIO (TUBING) ×5 IMPLANT
UNDERPAD 30X36 HEAVY ABSORB (UNDERPADS AND DIAPERS) ×5 IMPLANT
WATER STERILE IRR 1000ML POUR (IV SOLUTION) ×10 IMPLANT
YANKAUER SUCT BULB TIP NO VENT (SUCTIONS) ×2 IMPLANT

## 2019-11-11 NOTE — Anesthesia Procedure Notes (Signed)
Procedure Name: Intubation Date/Time: 11/11/2019 8:00 AM Performed by: Candis Shine, CRNA Pre-anesthesia Checklist: Patient identified, Emergency Drugs available, Suction available and Patient being monitored Patient Re-evaluated:Patient Re-evaluated prior to induction Oxygen Delivery Method: Circle System Utilized Preoxygenation: Pre-oxygenation with 100% oxygen Induction Type: IV induction Ventilation: Mask ventilation without difficulty and Oral airway inserted - appropriate to patient size Laryngoscope Size: Mac and 4 Grade View: Grade I Tube type: Oral Tube size: 8.0 mm Number of attempts: 1 Airway Equipment and Method: Stylet and Oral airway Placement Confirmation: ETT inserted through vocal cords under direct vision,  positive ETCO2 and breath sounds checked- equal and bilateral Secured at: 22 cm Tube secured with: Tape Dental Injury: Teeth and Oropharynx as per pre-operative assessment

## 2019-11-11 NOTE — Progress Notes (Signed)
Pre Procedure note for inpatients:   [name] has been scheduled for Procedure(s): CORONARY ARTERY BYPASS GRAFTING (CABG) (N/A) possible, PLACEMENT OF IMPELLA LEFT VENTRICULAR ASSIST DEVICE 5.5 (N/A) TRANSESOPHAGEAL ECHOCARDIOGRAM (TEE) (N/A) today. The various methods of treatment have been discussed with the patient. After consideration of the risks, benefits and treatment options the patient has consented to the planned procedure.   The patient has been seen and labs reviewed. There are no changes in the patient's condition to prevent proceeding with the planned procedure today.  Recent labs:  Lab Results  Component Value Date   WBC 5.6 11/11/2019   HGB 14.8 11/11/2019   HCT 45.3 11/11/2019   PLT 213 11/11/2019   GLUCOSE 115 (H) 11/11/2019   CHOL 154 10/24/2019   TRIG 120 10/24/2019   HDL 61 10/24/2019   LDLCALC 69 10/24/2019   ALT 22 11/07/2019   AST 17 11/07/2019   NA 136 11/11/2019   K 4.2 11/11/2019   CL 100 11/11/2019   CREATININE 1.34 (H) 11/11/2019   BUN 30 (H) 11/11/2019   CO2 26 11/11/2019   TSH 3.303 11/07/2019   PSA 0.83 06/15/2015   INR 1.0 11/07/2019   HGBA1C 5.6 11/07/2019    Len Childs, MD 11/11/2019 7:26 AM

## 2019-11-11 NOTE — Anesthesia Procedure Notes (Signed)
Central Venous Catheter Insertion Performed by: Suzette Battiest, MD, anesthesiologist Start/End9/13/2021 6:50 AM, 11/11/2019 7:05 AM Patient location: Pre-op. Preanesthetic checklist: patient identified, IV checked, site marked, risks and benefits discussed, surgical consent, monitors and equipment checked, pre-op evaluation, timeout performed and anesthesia consent Position: Trendelenburg Lidocaine 1% used for infiltration and patient sedated Hand hygiene performed , maximum sterile barriers used  and Seldinger technique used Catheter size: 8.5 Fr Total catheter length 10. Central line was placed.Sheath introducer Swan type:thermodilution PA Cath depth:45 Procedure performed using ultrasound guided technique. Ultrasound Notes:anatomy identified, needle tip was noted to be adjacent to the nerve/plexus identified, no ultrasound evidence of intravascular and/or intraneural injection and image(s) printed for medical record Attempts: 1 Following insertion, line sutured, dressing applied and Biopatch. Post procedure assessment: blood return through all ports, free fluid flow and no air  Patient tolerated the procedure well with no immediate complications.

## 2019-11-11 NOTE — Op Note (Signed)
NAME: [name], Parsons [id number] ACCOUNT [id number] DATE OF BIRTH:June 14, 1950 FACILITY: [facility] LOCATION: [facility] PHYSICIAN:Yessica Putnam VAN TRIGT III, MD  OPERATIVE REPORT  DATE OF PROCEDURE:  11/11/2019  OPERATION: 1.  Coronary artery bypass grafting x4 (left internal mammary artery to diagonal, saphenous vein graft to obtuse marginal 1, saphenous vein graft to obtuse marginal 2, saphenous vein graft to posterior descending). 2.  Endoscopic harvest of greater saphenous vein. 3.  Placement of Impella 5.5 direct percutaneous left ventricular assist device through a 10 mm graft sewn to the ascending aorta.  SURGEON:  Ivin Poot, MD  ASSISTANT:  Jadene Pierini, PA-C.  ANESTHESIA:  General by Dr. Oleta Mouse.  PREPROCEDURE DIAGNOSES:   1.  Ischemic cardiomyopathy.   2.  Class IV heart failure.  3.  Severe 3-vessel coronary artery disease.  4.  Severe peripheral vascular disease.   5.  Prior history of smoking.  POSTOPERATIVE DIAGNOSES:   1.  Ischemic cardiomyopathy.   2.  Class IV heart failure.  3.  Severe 3-vessel coronary artery disease.  4.  Severe peripheral vascular disease.   5.  Prior history of smoking.  CLINICAL NOTE:  The patient is a 69 year old gentleman who presented to an outside hospital ER with acute shortness of breath with interstitial pulmonary edema and mild peripheral edema.  An echocardiogram showed EF to be 20% to 25% with severe LV  dysfunction.  Subsequent cardiac catheterization demonstrated severe 3-vessel coronary disease with reduced cardiac output.  He was transferred to this hospital for consideration of CABG.  The patient had been on Qazi term Plavix for stents in his  femoral artery and previous coronary stent placement.  The patient stabilized with medical therapy and diuresis which was started because of an elevated wedge pressure.  Heart Failure  Cardiology assessed the patient and monitor his mixed venous  saturation and titrated  his preoperative medications.  I discussed the procedure of high-risk CABG with the patient, including the potential for needing temporary percutaneous Impella LVAD support.  He understood the benefits to include improved  Pol-term survival and improved symptoms and exercise tolerance.  He understood the risks to include stroke, bleeding, infection, organ failure, postoperative pulmonary problems, infection, and death.  He demonstrated his understanding and agreed to  proceed with surgery under what I felt was an informed consent.  OPERATIVE FINDINGS: 1.  Severe diffuse coronary disease with somewhat small coronary targets. 2.  Significant atherosclerotic and atheroma of the ascending aorta and aortic arch. 3.  Placement of a direct transaortic Impella LVAD for mechanical support to separate from cardiopulmonary bypass.  DESCRIPTION OF PROCEDURE:  The patient was brought from the preoperative holding area where informed consent was documented and preoperative issues addressed with the patient.  He was brought back to the operating room and placed supine on the operating  table and general anesthesia was induced.  He remained stable.  A transesophageal echo probe was placed by the anesthesia team, which demonstrated severe LV dysfunction and mild mitral regurgitation.  The patient was then prepped and draped as a sterile  field and a proper time-out was performed.  A sternal incision was made as the saphenous vein was harvested endoscopically from the right leg.  The left internal mammary artery was harvested as a pedicle graft from its origin at the subclavian vessels.   It was a 1.5 mm vessel with good flow.  The sternal retractor was placed and the pericardium was opened.  The ascending aorta was inspected  and palpated.  There was some atheroma and calcifications of the aortic root as well as the aortic arch.   Pursestrings were placed in the ascending aorta in an area of minimal plaque and a  pursestring was placed in the right atrium.  After the heparin was administered and the vein had been harvested, the patient was cannulated and placed on cardiopulmonary  bypass.  The coronaries were identified for grafting.  The LAD was small and a short vessel.  A larger diagonal was chosen as the target to receive the internal mammary artery graft for the severe left main stenosis.  The OM1, OM2 and posterior  descending were found to be small but adequate targets.  Cardioplegia cannulas were placed for both antegrade and retrograde cold blood cardioplegia and the patient was cooled to 32 degrees.  The aortic crossclamp was applied, and a liter of cold blood  cardioplegia was delivered in split doses between the antegrade aortic and retrograde coronary sinus catheters.  There was good cardioplegic arrest and supple temperature dropped less than 12 degrees.  Cardioplegia was delivered every 20 minutes while  the crossclamp was placed.  The distal coronary anastomoses were performed.  The first distal anastomosis was the posterior descending.  This had a proximal 70% stenosis.  Reverse saphenous vein was sewn end-to-side with running 7-0 Prolene with good flow through the graft.   Cardioplegia was redosed.  The second distal anastomosis was the OM2 of the left coronary.  This was a 1.5 mm vessel, proximal with 80% to 90% left main stenosis.  Reverse saphenous vein was sewn end-to-side with running 7-0 Prolene with good flow through the graft.  Cardioplegia  was redosed.  The third distal anastomosis was the OM1 branch of the left coronary.  This had been totally occluded.  A reverse saphenous vein was sewn end-to-side with running 7-0 Prolene with good flow through the graft.  Cardioplegia was redosed.  The fourth distal anastomosis was the diagonal branch of the LAD.  This was a 1.5 mm vessel, with proximal 80% to 90% left main stenosis.  The left IMA pedicle was brought through an opening in the  left lateral pericardium and was brought onto the LAD  and sewn end-to-side with running 8-0  Prolene.  There was good flow through the anastomosis after briefly releasing the pedicle bulldog on the mammary artery.  The bulldog was reapplied and the pedicle was secured to the epicardium.  Cardioplegia was  redosed.  While the crossclamp was still in place, 2 proximal vein anastomoses were performed using a 4.5 mm punch and running 6-0 Prolene.  The posterior descending vein and the OM2 vein grafts were sewn to the aorta.  The OM1 proximal anastomosis was placed to  the hood of the OM2 vein because of significant disease of the aorta above the sinotubular junction, which was avoided to prevent potential embolic or anastomotic complications.  After the 2 proximal anastomoses were performed, an 8 mm Gelweave graft was sewn to the ascending aorta using a running 4-0 Prolene to allow placement of a percutaneous Impella 5.5 LVAD.  After this was placed, the heart was reperfused with a retrograde  dose of warm blood cardioplegia to remove air from the coronaries and left side of the heart.  The crossclamp was then removed as the clamp was placed on the 10 mm graft.  The vein grafts were de-aired and opened.  Each had good flow and hemostasis was documented at the proximal and distal  sites.  The patient was rewarmed and reperfused.  Temporary pacing wires were placed.  The lungs were expanded.  The ventilator was  resumed.  The patient was then partially reduced from cardiopulmonary bypass so that the aortic valve was opening and the heart was ejecting.  The echo showed persistent severe diffuse LV dysfunction.  The decision was made to place the percutaneous LVAD  to provide mechanical support for the next few days to reduce the workload on his dysfunctional left ventricle.  The 5.5 Impella was placed through the graft, through the aortic valve and positioned in the left ventricle using the typical technique  and  with echo assistance to confirm the proper placement and position of the Impella.  It was immediately increased from P2 level up to P7 with a flow of 3.8 L per minute.  The introducer was placed into the graft, which exited the neck above the sternal  incision and was secured with several heavy silk ties around the introducer.  The patient was then completely separated off cardiopulmonary bypass and remained stable.  Echo showed persistent diffuse LV dysfunction, but with the Impella in good position and the heart was not distended.  Protamine was administered and there was no  adverse reaction.  However, the patient still had significant coagulopathy.  He had been on Plavix preoperatively for his femoral stent.  He was given platelets, FFP with improvement in coagulation function.  The superior pericardial fat was closed over  the aorta and vein grafts.  Bilateral pleural tubes and anterior mediastinal chest tube were placed and brought out through separate incisions.  The sternum was closed with wire.  The patient remained stable.  The pectoralis fascia was closed with a  running #1 Vicryl.  Subcutaneous and skin layers were closed with running Vicryl and sterile dressings were applied.  Total cardiopulmonary bypass time was 160 minutes.  VN/NUANCE  D:11/11/2019 T:11/11/2019 JOB:[id number]

## 2019-11-11 NOTE — Anesthesia Preprocedure Evaluation (Addendum)
Anesthesia Evaluation  Patient identified by MRN, date of birth, ID band Patient awake    Reviewed: Allergy & Precautions, NPO status , Patient's Chart, lab work & pertinent test results  Airway Mallampati: II  TM Distance: >3 FB Neck ROM: Full    Dental  (+) Dental Advisory Given   Pulmonary shortness of breath, neg recent URI, former smoker,    breath sounds clear to auscultation       Cardiovascular hypertension, + angina + CAD, + Past MI and + Peripheral Vascular Disease  + dysrhythmias  Rhythm:Regular     Neuro/Psych negative neurological ROS  negative psych ROS   GI/Hepatic Neg liver ROS, hiatal hernia, PUD, GERD  ,  Endo/Other  negative endocrine ROS  Renal/GU negative Renal ROS     Musculoskeletal  (+) Arthritis ,   Abdominal   Peds  Hematology negative hematology ROS (+)   Anesthesia Other Findings   Reproductive/Obstetrics                            Anesthesia Physical Anesthesia Plan  ASA: IV  Anesthesia Plan: General   Post-op Pain Management:    Induction: Intravenous  PONV Risk Score and Plan: 2 and Treatment may vary due to age or medical condition  Airway Management Planned: Oral ETT  Additional Equipment: Arterial line, CVP, PA Cath, TEE and Ultrasound Guidance Line Placement  Intra-op Plan:   Post-operative Plan: Post-operative intubation/ventilation  Informed Consent: I have reviewed the patients History and Physical, chart, labs and discussed the procedure including the risks, benefits and alternatives for the proposed anesthesia with the patient or authorized representative who has indicated his/her understanding and acceptance.     Dental advisory given  Plan Discussed with: Surgeon and CRNA  Anesthesia Plan Comments:         Anesthesia Quick Evaluation

## 2019-11-11 NOTE — Brief Op Note (Signed)
11/05/2019 - 11/11/2019  1:31 PM  PATIENT:  [name]  69 y.o. male  PRE-OPERATIVE DIAGNOSIS:  CORONARY ARTERY DISEASE  POST-OPERATIVE DIAGNOSIS:  CORONARY ARTERY DISEASE  PROCEDURE:  Procedure(s): CORONARY ARTERY BYPASS GRAFTING (CABG) USING LIMA to Diag1; ENDOSCOPICALLY HARVESTED RIGHT GREATER SAPHENOUS VEIN: SVG to OM1; SVG to OM2; SVG to PDA. (N/A) possible, PLACEMENT OF IMPELLA LEFT VENTRICULAR ASSIST DEVICE 5.5 (N/A) TRANSESOPHAGEAL ECHOCARDIOGRAM (TEE) (N/A) ENDOVEIN HARVEST OF GREATER SAPHENOUS VEIN (Right) LIMA-DIAG[LAD short, small] SVG-OM1 SVG-OM2 SVG-PD  Placement of Impella 5.5 Direct percutaneous LVAD  SURGEON:  Surgeon(s) and Role:    Ivin Poot, MD - Primary  PHYSICIAN ASSISTANT: WAYNE GOLD PA-C  ANESTHESIA:   general  EBL:  500 cc  BLOOD ADMINISTERED:PLATELETS AND FFP  DRAINS: LEFT PLEURAL AND MEDIASTINAL CHEST DRAINS   LOCAL MEDICATIONS USED:  NONE  SPECIMEN:  No Specimen  DISPOSITION OF SPECIMEN:  N/A  COUNTS:  YES  TOURNIQUET:  * No tourniquets in log *  DICTATION: .Other Dictation: Dictation Number PENDING  PLAN OF CARE: Admit to inpatient   PATIENT DISPOSITION:  ICU - intubated and hemodynamically stable.   Delay start of Pharmacological VTE agent (>24hrs) due to surgical blood loss or risk of bleeding: yes  COMPLICATIONS: NO KNOWN

## 2019-11-11 NOTE — Transfer of Care (Signed)
Immediate Anesthesia Transfer of Care Note  Patient: [name]  Procedure(s) Performed: CORONARY ARTERY BYPASS GRAFTING (CABG) USING LIMA to Diag1; ENDOSCOPICALLY HARVESTED RIGHT GREATER SAPHENOUS VEIN: SVG to OM1; SVG to OM2; SVG to PDA. (N/A Chest) PLACEMENT OF IMPELLA LEFT VENTRICULAR ASSIST DEVICE 5.5 (N/A Chest) TRANSESOPHAGEAL ECHOCARDIOGRAM (TEE) (N/A ) ENDOVEIN HARVEST OF GREATER SAPHENOUS VEIN (Right Leg Upper)  Patient Location: SICU  Anesthesia Type:General  Level of Consciousness: sedated and Patient remains intubated per anesthesia plan  Airway & Oxygen Therapy: Patient remains intubated per anesthesia plan and Patient placed on Ventilator (see vital sign flow sheet for setting)  Post-op Assessment: Report given to RN and Post -op Vital signs reviewed and stable  Post vital signs: Reviewed and stable  Last Vitals:  Vitals Value Taken Time  BP 97/72 11/11/19 1630  Temp 36.6 C 11/11/19 1632  Pulse 104 11/11/19 1632  Resp 14 11/11/19 1632  SpO2 98 % 11/11/19 1632  Vitals shown include unvalidated device data.  Last Pain:  Vitals:   11/11/19 0319  TempSrc: Oral  PainSc: 0-No pain      Patients Stated Pain Goal: 0 (63/33/54 [id number])  Complications: No complications documented.

## 2019-11-11 NOTE — Progress Notes (Signed)
[facility] BRIEF SICU PROGRESS NOTE  Day of Surgery  S/P Procedure(s) (LRB): CORONARY ARTERY BYPASS GRAFTING (CABG) USING LIMA to Diag1; ENDOSCOPICALLY HARVESTED RIGHT GREATER SAPHENOUS VEIN: SVG to OM1; SVG to OM2; SVG to PDA. (N/A) PLACEMENT OF IMPELLA LEFT VENTRICULAR ASSIST DEVICE 5.5 (N/A) TRANSESOPHAGEAL ECHOCARDIOGRAM (TEE) (N/A) ENDOVEIN HARVEST OF GREATER SAPHENOUS VEIN (Right)   Sedated on vent NSR w/ stable hemodynamics Impella flows 3.6 on P6, Epi and milrinone drips O2 sats 99-100% Chest tube output < 100 mL/hr Repeat labs pending  Plan: Keep sedated on vent overnight.  Follow up Hgb  Rexene Alberts, MD 11/11/2019 11:02 PM

## 2019-11-11 NOTE — Anesthesia Procedure Notes (Signed)
Central Venous Catheter Insertion Performed by: Suzette Battiest, MD, anesthesiologist Start/End9/13/2021 6:50 AM, 11/11/2019 7:05 AM Patient location: Pre-op. Preanesthetic checklist: patient identified, IV checked, site marked, risks and benefits discussed, surgical consent, monitors and equipment checked, pre-op evaluation, timeout performed and anesthesia consent Hand hygiene performed  and maximum sterile barriers used  PA cath was placed.Swan type:thermodilution PA Cath depth:45 Procedure performed without using ultrasound guided technique. Attempts: 1 Patient tolerated the procedure well with no immediate complications.

## 2019-11-11 NOTE — Anesthesia Procedure Notes (Signed)
Arterial Line Insertion Start/End9/13/2021 6:50 AM Performed by: Candis Shine, CRNA, CRNA  Patient location: Pre-op. Preanesthetic checklist: patient identified, IV checked, site marked, risks and benefits discussed, surgical consent, monitors and equipment checked, pre-op evaluation, timeout performed and anesthesia consent Lidocaine 1% used for infiltration Left, radial was placed Catheter size: 20 G Hand hygiene performed  and maximum sterile barriers used   Attempts: 1 Procedure performed without using ultrasound guided technique. Following insertion, dressing applied and Biopatch. Post procedure assessment: normal and unchanged  Patient tolerated the procedure well with no immediate complications.

## 2019-11-12 ENCOUNTER — Encounter (HOSPITAL_COMMUNITY): Payer: Self-pay | Admitting: Cardiothoracic Surgery

## 2019-11-12 ENCOUNTER — Inpatient Hospital Stay (HOSPITAL_COMMUNITY): Payer: PPO

## 2019-11-12 DIAGNOSIS — R570 Cardiogenic shock: Secondary | ICD-10-CM

## 2019-11-12 LAB — PREPARE PLATELET PHERESIS
Unit division: 0
Unit division: 0

## 2019-11-12 LAB — POCT I-STAT 7, (LYTES, BLD GAS, ICA,H+H)
Acid-Base Excess: 2 mmol/L (ref 0.0–2.0)
Acid-Base Excess: 1 mmol/L (ref 0.0–2.0)
Acid-Base Excess: 0 mmol/L (ref 0.0–2.0)
Acid-base deficit: 1 mmol/L (ref 0.0–2.0)
Acid-base deficit: 4 mmol/L — ABNORMAL HIGH (ref 0.0–2.0)
Bicarbonate: 23.7 mmol/L (ref 20.0–28.0)
Bicarbonate: 21 mmol/L (ref 20.0–28.0)
Bicarbonate: 26.6 mmol/L (ref 20.0–28.0)
Bicarbonate: 25.5 mmol/L (ref 20.0–28.0)
Bicarbonate: 25 mmol/L (ref 20.0–28.0)
Calcium, Ion: 1.11 mmol/L — ABNORMAL LOW (ref 1.15–1.40)
Calcium, Ion: 1.09 mmol/L — ABNORMAL LOW (ref 1.15–1.40)
Calcium, Ion: 1.12 mmol/L — ABNORMAL LOW (ref 1.15–1.40)
Calcium, Ion: 1.1 mmol/L — ABNORMAL LOW (ref 1.15–1.40)
Calcium, Ion: 1.09 mmol/L — ABNORMAL LOW (ref 1.15–1.40)
HCT: 27 % — ABNORMAL LOW (ref 39.0–52.0)
HCT: 19 % — ABNORMAL LOW (ref 39.0–52.0)
HCT: 23 % — ABNORMAL LOW (ref 39.0–52.0)
HCT: 24 % — ABNORMAL LOW (ref 39.0–52.0)
HCT: 25 % — ABNORMAL LOW (ref 39.0–52.0)
Hemoglobin: 9.2 g/dL — ABNORMAL LOW (ref 13.0–17.0)
Hemoglobin: 6.5 g/dL — CL (ref 13.0–17.0)
Hemoglobin: 7.8 g/dL — ABNORMAL LOW (ref 13.0–17.0)
Hemoglobin: 8.2 g/dL — ABNORMAL LOW (ref 13.0–17.0)
Hemoglobin: 8.5 g/dL — ABNORMAL LOW (ref 13.0–17.0)
O2 Saturation: 97 %
O2 Saturation: 99 %
O2 Saturation: 99 %
O2 Saturation: 98 %
O2 Saturation: 96 %
Patient temperature: 36.5
Patient temperature: 37.5
Patient temperature: 37.5
Patient temperature: 37.3
Potassium: 4.3 mmol/L (ref 3.5–5.1)
Potassium: 3.4 mmol/L — ABNORMAL LOW (ref 3.5–5.1)
Potassium: 4.3 mmol/L (ref 3.5–5.1)
Potassium: 4.3 mmol/L (ref 3.5–5.1)
Potassium: 4.1 mmol/L (ref 3.5–5.1)
Sodium: 141 mmol/L (ref 135–145)
Sodium: 143 mmol/L (ref 135–145)
Sodium: 141 mmol/L (ref 135–145)
Sodium: 141 mmol/L (ref 135–145)
Sodium: 140 mmol/L (ref 135–145)
TCO2: 25 mmol/L (ref 22–32)
TCO2: 22 mmol/L (ref 22–32)
TCO2: 28 mmol/L (ref 22–32)
TCO2: 27 mmol/L (ref 22–32)
TCO2: 26 mmol/L (ref 22–32)
pCO2 arterial: 40.1 mmHg (ref 32.0–48.0)
pCO2 arterial: 37.5 mmHg (ref 32.0–48.0)
pCO2 arterial: 43.6 mmHg (ref 32.0–48.0)
pCO2 arterial: 39.6 mmHg (ref 32.0–48.0)
pCO2 arterial: 40.1 mmHg (ref 32.0–48.0)
pH, Arterial: 7.38 (ref 7.350–7.450)
pH, Arterial: 7.354 (ref 7.350–7.450)
pH, Arterial: 7.395 (ref 7.350–7.450)
pH, Arterial: 7.419 (ref 7.350–7.450)
pH, Arterial: 7.403 (ref 7.350–7.450)
pO2, Arterial: 93 mmHg (ref 83.0–108.0)
pO2, Arterial: 162 mmHg — ABNORMAL HIGH (ref 83.0–108.0)
pO2, Arterial: 144 mmHg — ABNORMAL HIGH (ref 83.0–108.0)
pO2, Arterial: 108 mmHg (ref 83.0–108.0)
pO2, Arterial: 87 mmHg (ref 83.0–108.0)

## 2019-11-12 LAB — BPAM FFP
Blood Product Expiration Date: 202109182359
Blood Product Expiration Date: 202109182359
Blood Product Expiration Date: 202109182359
ISSUE DATE / TIME: 202109131243
ISSUE DATE / TIME: 202109131243
ISSUE DATE / TIME: 202109131734
Unit Type and Rh: 7300
Unit Type and Rh: 7300
Unit Type and Rh: 7300

## 2019-11-12 LAB — BASIC METABOLIC PANEL
Anion gap: 9 (ref 5–15)
Anion gap: 10 (ref 5–15)
BUN: 24 mg/dL — ABNORMAL HIGH (ref 8–23)
BUN: 20 mg/dL (ref 8–23)
CO2: 24 mmol/L (ref 22–32)
CO2: 23 mmol/L (ref 22–32)
Calcium: 7.8 mg/dL — ABNORMAL LOW (ref 8.9–10.3)
Calcium: 7.8 mg/dL — ABNORMAL LOW (ref 8.9–10.3)
Chloride: 105 mmol/L (ref 98–111)
Chloride: 103 mmol/L (ref 98–111)
Creatinine, Ser: 1.2 mg/dL (ref 0.61–1.24)
Creatinine, Ser: 1.14 mg/dL (ref 0.61–1.24)
GFR calc Af Amer: >60 mL/min (ref >60)
GFR calc Af Amer: >60 mL/min (ref >60)
GFR calc non Af Amer: >60 mL/min (ref >60)
GFR calc non Af Amer: >60 mL/min (ref >60)
Glucose, Bld: 130 mg/dL — ABNORMAL HIGH (ref 70–99)
Glucose, Bld: 141 mg/dL — ABNORMAL HIGH (ref 70–99)
Potassium: 4.4 mmol/L (ref 3.5–5.1)
Potassium: 4.1 mmol/L (ref 3.5–5.1)
Sodium: 138 mmol/L (ref 135–145)
Sodium: 136 mmol/L (ref 135–145)

## 2019-11-12 LAB — CBC
HCT: 24.3 % — ABNORMAL LOW (ref 39.0–52.0)
HCT: 27.5 % — ABNORMAL LOW (ref 39.0–52.0)
Hemoglobin: 8.1 g/dL — ABNORMAL LOW (ref 13.0–17.0)
Hemoglobin: 9.1 g/dL — ABNORMAL LOW (ref 13.0–17.0)
MCH: 32 pg (ref 26.0–34.0)
MCH: 31.5 pg (ref 26.0–34.0)
MCHC: 33.3 g/dL (ref 30.0–36.0)
MCHC: 33.1 g/dL (ref 30.0–36.0)
MCV: 96 fL (ref 80.0–100.0)
MCV: 95.2 fL (ref 80.0–100.0)
Platelets: 174 10*3/uL (ref 150–400)
Platelets: 141 10*3/uL — ABNORMAL LOW (ref 150–400)
RBC: 2.53 MIL/uL — ABNORMAL LOW (ref 4.22–5.81)
RBC: 2.89 MIL/uL — ABNORMAL LOW (ref 4.22–5.81)
RDW: 12.5 % (ref 11.5–15.5)
RDW: 13.6 % (ref 11.5–15.5)
WBC: 7 10*3/uL (ref 4.0–10.5)
WBC: 9.1 10*3/uL (ref 4.0–10.5)
nRBC: 0 % (ref 0.0–0.2)
nRBC: 0 % (ref 0.0–0.2)

## 2019-11-12 LAB — PREPARE FRESH FROZEN PLASMA
Unit division: 0
Unit division: 0

## 2019-11-12 LAB — GLUCOSE, CAPILLARY
Glucose-Capillary: 116 mg/dL — ABNORMAL HIGH (ref 70–99)
Glucose-Capillary: 130 mg/dL — ABNORMAL HIGH (ref 70–99)
Glucose-Capillary: 132 mg/dL — ABNORMAL HIGH (ref 70–99)
Glucose-Capillary: 132 mg/dL — ABNORMAL HIGH (ref 70–99)
Glucose-Capillary: 134 mg/dL — ABNORMAL HIGH (ref 70–99)
Glucose-Capillary: 134 mg/dL — ABNORMAL HIGH (ref 70–99)
Glucose-Capillary: 134 mg/dL — ABNORMAL HIGH (ref 70–99)
Glucose-Capillary: 137 mg/dL — ABNORMAL HIGH (ref 70–99)
Glucose-Capillary: 148 mg/dL — ABNORMAL HIGH (ref 70–99)
Glucose-Capillary: 163 mg/dL — ABNORMAL HIGH (ref 70–99)
Glucose-Capillary: 165 mg/dL — ABNORMAL HIGH (ref 70–99)
Glucose-Capillary: 174 mg/dL — ABNORMAL HIGH (ref 70–99)

## 2019-11-12 LAB — MAGNESIUM
Magnesium: 2.7 mg/dL — ABNORMAL HIGH (ref 1.7–2.4)
Magnesium: 2.3 mg/dL (ref 1.7–2.4)

## 2019-11-12 LAB — BPAM PLATELET PHERESIS
Blood Product Expiration Date: 202109142359
Blood Product Expiration Date: 202109152359
ISSUE DATE / TIME: 202109131243
ISSUE DATE / TIME: 202109131734
Unit Type and Rh: 5100
Unit Type and Rh: 5100

## 2019-11-12 LAB — ECHOCARDIOGRAM LIMITED
Height: 66 in
Weight: 3058.22 oz

## 2019-11-12 LAB — POCT I-STAT, CHEM 8
BUN: 26 mg/dL — ABNORMAL HIGH (ref 8–23)
Calcium, Ion: 1.09 mmol/L — ABNORMAL LOW (ref 1.15–1.40)
Chloride: 105 mmol/L (ref 98–111)
Creatinine, Ser: 1.1 mg/dL (ref 0.61–1.24)
Glucose, Bld: 139 mg/dL — ABNORMAL HIGH (ref 70–99)
HCT: 24 % — ABNORMAL LOW (ref 39.0–52.0)
Hemoglobin: 8.2 g/dL — ABNORMAL LOW (ref 13.0–17.0)
Potassium: 4.2 mmol/L (ref 3.5–5.1)
Sodium: 141 mmol/L (ref 135–145)
TCO2: 22 mmol/L (ref 22–32)

## 2019-11-12 LAB — COOXEMETRY PANEL
Carboxyhemoglobin: 1.7 % — ABNORMAL HIGH (ref 0.5–1.5)
Methemoglobin: 1.6 % — ABNORMAL HIGH (ref 0.0–1.5)
O2 Saturation: 68.1 %
Total hemoglobin: 7.7 g/dL — ABNORMAL LOW (ref 12.0–16.0)

## 2019-11-12 LAB — PREPARE RBC (CROSSMATCH)

## 2019-11-12 LAB — HEPARIN LEVEL (UNFRACTIONATED): Heparin Unfractionated: <0.1 IU/mL — ABNORMAL LOW (ref 0.30–0.70)

## 2019-11-12 LAB — APTT: aPTT: 41 seconds — ABNORMAL HIGH (ref 24–36)

## 2019-11-12 LAB — LACTATE DEHYDROGENASE: LDH: 482 U/L — ABNORMAL HIGH (ref 98–192)

## 2019-11-12 MED ORDER — FUROSEMIDE 10 MG/ML IJ SOLN: 20.0000 mg | Freq: Every day | INTRAMUSCULAR | Status: DC

## 2019-11-12 MED ORDER — AMIODARONE HCL IN DEXTROSE 360-4.14 MG/200ML-% IV SOLN
30.0000 mg/h | INTRAVENOUS | Status: DC
  Administered 2019-11-12 – 2019-11-13 (×3): 30 mg/h via INTRAVENOUS
  Filled 2019-11-12 (×3): qty 200

## 2019-11-12 MED ORDER — FUROSEMIDE 10 MG/ML IJ SOLN
40.0000 mg | Freq: Two times a day (BID) | INTRAMUSCULAR | Status: DC
  Administered 2019-11-12: 40 mg via INTRAVENOUS
  Filled 2019-11-12: qty 4

## 2019-11-12 MED ORDER — LORAZEPAM 2 MG/ML IJ SOLN
1.0000 mg | INTRAMUSCULAR | Status: DC | PRN
  Administered 2019-11-12 – 2019-11-13 (×2): 2 mg via INTRAVENOUS
  Filled 2019-11-12 (×2): qty 1

## 2019-11-12 MED ORDER — HEPARIN (PORCINE) 25000 UT/250ML-% IV SOLN
600.0000 [IU]/h | INTRAVENOUS | Status: DC
  Administered 2019-11-12 – 2019-11-13 (×2): 300 [IU]/h via INTRAVENOUS
  Filled 2019-11-12 (×2): qty 250

## 2019-11-12 MED ORDER — INSULIN ASPART 100 UNIT/ML ~~LOC~~ SOLN
0.0000 [IU] | SUBCUTANEOUS | Status: DC
  Administered 2019-11-12 – 2019-11-15 (×8): 2 [IU] via SUBCUTANEOUS

## 2019-11-12 MED ORDER — ORAL CARE MOUTH RINSE
15.0000 mL | Freq: Four times a day (QID) | OROMUCOSAL | Status: DC
  Administered 2019-11-12 (×2): 15 mL via OROMUCOSAL

## 2019-11-12 NOTE — Progress Notes (Signed)
CT surgery p.m. Rounds  Patient had stable day, inotropes weaned Impella remains at P6, good catheter position documented by bedside echo Diuresing well P.m. labs are stable hemoglobin 9.6 so we will start low-dose peripheral heparin Neuro intact

## 2019-11-12 NOTE — Progress Notes (Signed)
Rapid weaning protocol 

## 2019-11-12 NOTE — Anesthesia Postprocedure Evaluation (Signed)
Anesthesia Post Note  Patient: [name]  Procedure(s) Performed: CORONARY ARTERY BYPASS GRAFTING (CABG) USING LIMA to Diag1; ENDOSCOPICALLY HARVESTED RIGHT GREATER SAPHENOUS VEIN: SVG to OM1; SVG to OM2; SVG to PDA. (N/A Chest) PLACEMENT OF IMPELLA LEFT VENTRICULAR ASSIST DEVICE 5.5 (N/A Chest) TRANSESOPHAGEAL ECHOCARDIOGRAM (TEE) (N/A ) ENDOVEIN HARVEST OF GREATER SAPHENOUS VEIN (Right Leg Upper)     Patient location during evaluation: SICU Anesthesia Type: General Level of consciousness: sedated Pain management: pain level controlled Vital Signs Assessment: post-procedure vital signs reviewed and stable Respiratory status: patient remains intubated per anesthesia plan Cardiovascular status: stable Postop Assessment: no apparent nausea or vomiting Anesthetic complications: no   No complications documented.  Last Vitals:  Vitals:   11/12/19 1500 11/12/19 1502  BP:    Pulse: 83 79  Resp: 14 18  Temp: 37.6 C 37.6 C  SpO2: 94% 94%    Last Pain:  Vitals:   11/12/19 1502  TempSrc:   PainSc: 7                  Nubia Ziesmer

## 2019-11-12 NOTE — Progress Notes (Addendum)
Patient ID: [name], male   DOB: 1950-05-09, 69 y.o.   MRN: [id number]     Advanced Heart Failure Rounding Note  PCP-Cardiologist: Nelva Bush, MD   Subjective:   S/P CABG with direct Impella placed with difficulty weaning from bypass yesterday.   Remains intubated.  On Epi 2 mcg, norepi 5 mcg , milrinone 0.25 mcg. CO-OX 68%.   Impella 5.5 P6 Flow 3.4 LDH 482  [place]wan: CPO 0.7  PAPi 1.1 PAP: (26-52)/(15-34) 32/20 CVP:  [8 mmHg-16 mmHg] 12 mmHg CO:  [4.9 L/min-6.9 L/min] 4.9 L/min CI:  [2.6 L/min/m2-3.7 L/min/m2] 2.6 L/min/m2   Awake on vent.   Objective:   Weight Range: 86.7 kg Body mass index is 30.85 kg/m.   Vital Signs:   Temp:  [97 F (36.1 C)-99.9 F (37.7 C)] 99.5 F (37.5 C) (09/14 0600) Pulse Rate:  [56-207] 66 (09/14 0600) Resp:  [12-22] 12 (09/14 0600) BP: (87-124)/(62-100) 94/68 (09/14 0600) SpO2:  [85 %-100 %] 100 % (09/14 0600) Arterial Line BP: (87-140)/(54-85) 103/63 (09/14 0600) FiO2 (%):  [50 %] 50 % (09/14 0202) Weight:  [86.7 kg] 86.7 kg (09/14 0400) Last BM Date: 11/09/19  Weight change: Filed Weights   11/10/19 0429 11/11/19 0447 11/12/19 0400  Weight: 77.8 kg 78 kg 86.7 kg    Intake/Output:   Intake/Output Summary (Last 24 hours) at 11/12/2019 0719 Last data filed at 11/12/2019 0600 Gross per 24 hour  Intake 7165.61 ml  Output 2725 ml  Net 4440.61 ml      Physical Exam   CVP 12-13  General:  Intubated  HEENT: ETT  Neck: supple.JVP difficult to assess.  Carotids 2+ bilat; no bruits. No lymphadenopathy or thryomegaly appreciated. Cor: PMI nondisplaced. Regular rate & rhythm. No rubs, gallops or murmurs. Direct Impella  In place. Sternal dressing Lungs: clear Abdomen: soft, nontender, nondistended. No hepatosplenomegaly. No bruits or masses. Good bowel sounds. Extremities: no cyanosis, clubbing, rash, edema Neuro: Awake following commands.   Telemetry   NSR 60s   Labs    CBC Recent Labs    11/11/19 2300  11/11/19 2301 11/12/19 0400 11/12/19 0411  WBC 6.7  --  7.0  --   HGB 8.1*   < > 8.1* 7.8*  HCT 24.8*   < > 24.3* 23.0*  MCV 95.4  --  96.0  --   PLT 155  --  174  --    < > = values in this interval not displayed.   Basic Metabolic Panel Recent Labs    11/11/19 2300 11/11/19 2301 11/12/19 0400 11/12/19 0411  NA 138   < > 138 141  K 4.0   < > 4.4 4.3  CL 105  --  105  --   CO2 22  --  24  --   GLUCOSE 196*  --  130*  --   BUN 25*  --  24*  --   CREATININE 1.31*  --  1.20  --   CALCIUM 7.8*  --  7.8*  --   MG 2.6*  --  2.7*  --    < > = values in this interval not displayed.   Liver Function Tests No results for input(s): AST, ALT, ALKPHOS, BILITOT, PROT, ALBUMIN in the last 72 hours. No results for input(s): LIPASE, AMYLASE in the last 72 hours. Cardiac Enzymes No results for input(s): CKTOTAL, CKMB, CKMBINDEX, TROPONINI in the last 72 hours.  BNP: BNP (last 3 results) Recent Labs    11/05/19 2228  BNP 468.7*    ProBNP (last 3 results) No results for input(s): PROBNP in the last 8760 hours.   D-Dimer No results for input(s): DDIMER in the last 72 hours. Hemoglobin A1C No results for input(s): HGBA1C in the last 72 hours. Fasting Lipid Panel No results for input(s): CHOL, HDL, LDLCALC, TRIG, CHOLHDL, LDLDIRECT in the last 72 hours. Thyroid Function Tests No results for input(s): TSH, T4TOTAL, T3FREE, THYROIDAB in the last 72 hours.  Invalid input(s): FREET3  Other results:   Imaging    DG Chest Portable 1 View  Result Date: 11/11/2019 CLINICAL DATA:  Pneumothorax post CABG X 4 and Impella placement EXAM: PORTABLE CHEST 1 VIEW COMPARISON:  CT 11/06/2019, radiograph 11/24/2017 FINDINGS: *Endotracheal tube terminates in the mid trachea, 3.5 cm from the carina. *A right IJ approach Swan-Ganz catheter is in position with the tip near the main pulmonary artery. *Impella device delivered via arterial access appears appropriately positioned across the aortic  valve. *Mediastinal and bilateral pleural drains in place. *Telemetry leads project over the chest as well. *Post sternotomy changes with intact, reinforced sternal sutures and multiple mediastinal surgical clips compatible with history of CABG There is postoperative mediastinal widening, can be seen in the immediate postoperative state. Hazy opacities are present throughout both lungs likely reflecting combination of atelectasis and mild edema. No visible pneumothorax. Suspect small pleural effusions given some mild pleural thickening bilaterally. No acute osseous or soft tissue abnormality. Degenerative changes are present in the imaged spine and shoulders. IMPRESSION: 1. Satisfactory positioning of the support lines and tubes as described above. 2. Impella device in place across the aortic valve. 3. No discernible pneumothorax. Bilateral pleural and mediastinal drains in place. 4. Mediastinal widening, can be seen in the immediate postoperative state following sternotomy and CABG. 5. Hazy opacities throughout both lungs likely reflecting combination of atelectasis and mild edema. 6. Suspect small pleural effusions given some mild pleural thickening bilaterally. Electronically Signed   By: Lovena Le M.D.   On: 11/11/2019 16:07     Medications:     Scheduled Medications: . acetaminophen  1,000 mg Oral Q6H   Or  . acetaminophen (TYLENOL) oral liquid 160 mg/5 mL  1,000 mg Per Tube Q6H  . amiodarone  200 mg Oral BID  . aspirin EC  325 mg Oral Daily   Or  . aspirin  324 mg Per Tube Daily  . bisacodyl  10 mg Oral Daily   Or  . bisacodyl  10 mg Rectal Daily  . chlorhexidine gluconate (MEDLINE KIT)  15 mL Mouth Rinse BID  . Chlorhexidine Gluconate Cloth  6 each Topical Daily  . docusate sodium  200 mg Oral Daily  . mouth rinse  15 mL Mouth Rinse 10 times per day  . metoprolol tartrate  12.5 mg Oral BID   Or  . metoprolol tartrate  12.5 mg Per Tube BID  . mupirocin ointment  1 application Nasal  BID  . [START ON 11/13/2019] pantoprazole  40 mg Oral Daily  . pneumococcal 23 valent vaccine  0.5 mL Intramuscular Tomorrow-1000  . sodium chloride flush  10-40 mL Intracatheter Q12H  . sodium chloride flush  3 mL Intravenous Q12H    Infusions: . sodium chloride Stopped (11/12/19 0114)  . sodium chloride    . sodium chloride 10 mL/hr at 11/11/19 2047  . albumin human 12.5 g (11/11/19 2210)  . cefTRIAXone (ROCEPHIN)  IV 2 g (11/10/19 1348)  . cefUROXime (ZINACEF)  IV 1.5 g (11/12/19 0413)  .  dexmedetomidine (PRECEDEX) IV infusion 0.7 mcg/kg/hr (11/12/19 0717)  . epinephrine 2 mcg/min (11/11/19 1900)  . famotidine (PEPCID) IV 20 mg (11/12/19 0031)  . impella catheter heparin 50 unit/mL in dextrose 5%    . insulin 2.2 mL/hr at 11/11/19 1900  . lactated ringers    . lactated ringers    . lactated ringers 20 mL/hr at 11/12/19 0114  . milrinone 0.25 mcg/kg/min (11/11/19 2334)  . nitroGLYCERIN    . norepinephrine (LEVOPHED) Adult infusion 3 mcg/min (11/11/19 1900)  . phenylephrine (NEO-SYNEPHRINE) Adult infusion      PRN Medications: sodium chloride, albumin human, dextrose, lactated ringers, metoprolol tartrate, midazolam, morphine injection, ondansetron (ZOFRAN) IV, oxyCODONE, sodium chloride flush, sodium chloride flush, traMADol    Assessment/Plan   1. 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 Start lasix IV 40 mg twice daily.  - On aspirin - Hold bb for now.   2. CAD: H/o PCI to LCX in 2017. LHC 11/05/19 with multivessel CAD, including calcified 80% distal LMCA stenosis, small diffusely diseased distal LAD, chronically occluded OM1, and 50% mid RCA lesion. - Intolerance of statins, he is on Praluent at home.   3. . Acute systolic CHF: Ischemic cardiomyopathy.  Echo with EF 25-30%.   - If EF remains low after CABG, will be CRT-D candidate  with LBBB.   4. AKI:  Renal function stable.   5. PVCs: Frequent.   - Now on amiodarone. Continue po for now.  - Continue amiodarone peri-operatively.   6. LBBB  Length of Stay: 7  Amy Clegg, NP  11/12/2019, 7:19 AM  Advanced Heart Failure Team Pager [phone] (M-F; 7a - 4p)  Please contact [facility] for night-coverage after hours (4p -7a ) and weekends on [email or web address]  Patient seen with NP, agree with the above note.   He is intubated but awake.  Pressors as above.  Impella 5.5 placed to wean from CPB after surgery.  Stable this morning with good cardiac output.  Weight up 20 lbs post-op, CVP 12-13.   A-V sequential pacing  General: Awake on vent Neck: JVP 10-12 cm, no thyromegaly or thyroid nodule.  Lungs: Clear to auscultation bilaterally with normal respiratory effort. CV: Nondisplaced PMI.  Heart regular S1/S2, no S3/S4.  Impella sounds.  No peripheral edema.   Abdomen: Soft, nontender, no hepatosplenomegaly, no distention.  Skin: Intact without lesions or rashes.  Neurologic: Alert and follows commands.  Extremities: No clubbing or cyanosis.  HEENT: Normal.   1. CAD: H/o PCI to LCX in 2017. LHC 9/7/21with multivessel CAD, including calcified 80% distal LMCA stenosis, small diffusely diseased distal LAD, chronically occluded OM1, and 50% mid RCA lesion.  Now s/p CABG x 4 on 9/13 with LIMA-LAD, SVG-OM1, SVG-OM2, SVG-PDA.  - Continue ASA 81 - Intolerance of statins, he is on Praluent at home.  2. Cardiogenic shock/acute systolic CHF: Ischemic cardiomyopathy.  Echo pre-op with EF 25-30%.  After CABG, difficult to wean from bypass with RV dysfunction, Impella 5.5 placed.  Stable this morning with CI 4.57 on P6 Impella, milrinone 0.25, NE 5, epinephrine 2.  PAPi now about 1.1.  CVP 12-13, weight up 20 lbs. He is A-V sequentially pacing.  - Continue current support today, plan extubation.  Gentle wean of support likely starting in am.  - Check Impella position by echo =>  Impella at about  5.0 cm, RV function appears normal by echo. LDH OK.  - Gentle diuresis.  - If EF remains low after CABG, will be CRT-D candidate with LBBB.  3. AKI: With diuresis and losartan, now off.  Resolved.  4. PVCs: Frequent.  Now on amiodarone per tube.  - Continue amiodarone peri-operatively.  5. Acute hypoxemic respiratory failure: Post-op, plan to extubate today.  6. Anemia: post-op.  - 1 unit PRBCs today.   CRITICAL CARE Performed by: Loralie Champagne  Total critical care time: 40 minutes  Critical care time was exclusive of separately billable procedures and treating other patients.  Critical care was necessary to treat or prevent imminent or life-threatening deterioration.  Critical care was time spent personally by me on the following activities: development of treatment plan with patient and/or surrogate as well as nursing, discussions with consultants, evaluation of patient's response to treatment, examination of patient, obtaining history from patient or surrogate, ordering and performing treatments and interventions, ordering and review of laboratory studies, ordering and review of radiographic studies, pulse oximetry and re-evaluation of patient's condition.  Loralie Champagne 11/12/2019 8:41 AM

## 2019-11-12 NOTE — Progress Notes (Signed)
[place] for Heparin Indication: Impella 5.5  Allergies  Allergen Reactions  . Brilinta [Ticagrelor] Shortness Of Breath  . Zetia [Ezetimibe] Other (See Comments)    Muscle aches  . Statins Other (See Comments)    Failed Crestor 5 mg twice weekly, Crestor 20 mg daily, Pravastatin 40 mg qd, Lipitor, Zocor - muscle aches    Patient Measurements: Height: 5\' 6"  (167.6 cm) Weight: 86.7 kg (191 lb 2.2 oz) IBW/kg (Calculated) : 63.8 Heparin Dosing Weight: 79.7 kg  Vital Signs: Temp: 99.5 F (37.5 C) (09/14 0600) BP: 94/68 (09/14 0600) Pulse Rate: 66 (09/14 0600)  Labs: Recent Labs    11/10/19 0429 11/10/19 3976 11/11/19 0328 11/11/19 0808 11/11/19 1434 11/11/19 1627 11/11/19 1630 11/11/19 1630 11/11/19 2300 11/11/19 2300 11/11/19 2301 11/11/19 2301 11/12/19 0400 11/12/19 0411  HGB   < >  --  14.8   < > 11.6*   < > 9.6*   < > 8.1*   < > 6.5*   < > 8.1* 7.8*  HCT   < >  --  45.3   < > 34.0*   < > 29.2*   < > 24.8*   < > 19.0*  --  24.3* 23.0*  PLT   < >  --  213   < >  --   --  160  --  155  --   --   --  174  --   APTT  --   --   --   --   --   --  28  --   --   --   --   --  41*  --   LABPROT  --   --   --   --   --   --  16.2*  --   --   --   --   --   --   --   INR  --   --   --   --   --   --  1.4*  --   --   --   --   --   --   --   HEPARINUNFRC  --  0.53 0.45  --   --   --   --   --   --   --   --   --  <0.10*  --   CREATININE   < >  --  1.34*   < > 1.10  --   --   --  1.31*  --   --   --  1.20  --    < > = values in this interval not displayed.    Estimated Creatinine Clearance: 60 mL/min (by C-G formula based on SCr of 1.2 mg/dL).   Medical History: Past Medical History:  Diagnosis Date  . Abnormal nuclear cardiac imaging test 08/08/2015  . Arthritis    fingers  . Carotid artery occlusion   . Coronary atherosclerosis of native coronary artery 01/29/2013   11/05/19 R/LHC 80% dLMCA stenosis small diffusely dz dLAD,  chronically occluded OM1 50%mRCA lesion, widely patent mLCx strent, moderately elevated L heart filling pressures, mild to moderate RH filling pressures, normal to moderately reduced CO  . Duodenal erosion   . Encounter for screening for lung cancer 07/13/2016  . Esophageal stenosis    esophageal dilation  . GERD (gastroesophageal reflux disease)   . H. pylori infection   . Heart attack ([place])  Oct. 2009   Mild  . Hiatal hernia   . Hyperlipidemia   . Hypertension   . 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 Cardiac catheterization-nonobstructive 60% RCA disease-subsequent nuclear stress test-9 minutes, low risk, mild inferior wall hypokinesis   . Pain in limb 12/19/2017  . Peripheral vascular disease ([place])   . Unstable angina ([place]) 11/25/2017    Assessment: 69 yo M w/ multivessel CAD now s/p CABG x4 with placement of Impella 5.5 on 9/13. Pharmacy asked to monitor anticoagulation with Impella.  Impella currently at P6 with heparin running through purge. Purge flow currently 11.2 ml/hr (560 units/hr). Heparin level this morning undetectable, APTT 41, LDH 482. Hgb stable 8.1, plts 174. Received 1 unit of PRBC this AM. No bleeding per discussion with nursing.  Discussed with Dr. Prescott Gum this afternoon. Will start systemic fixed-rate heparin at 300 units/hr at 1800 tonight.  Goal of Therapy:  Heparin level 0.2-0.3 units/ml - will not titrate to goal for now Monitor platelets by anticoagulation protocol: Yes   Plan:  Continue heparin through purge solution Start systemic heparin at 300 units/hr at 1800 tonight Check 6-hr HL after starting heparin Monitor daily LDH and CBC  Richardine Service, PharmD PGY2 Cardiology Pharmacy Resident Phone: [phone] 11/12/2019  4:06 PM  Please check [email or web address] for unit-specific pharmacy phone numbers.

## 2019-11-12 NOTE — Progress Notes (Signed)
1 Day Post-Op Procedure(s) (LRB): CORONARY ARTERY BYPASS GRAFTING (CABG) USING LIMA to Diag1; ENDOSCOPICALLY HARVESTED RIGHT GREATER SAPHENOUS VEIN: SVG to OM1; SVG to OM2; SVG to PDA. (N/A) PLACEMENT OF IMPELLA LEFT VENTRICULAR ASSIST DEVICE 5.5 (N/A) TRANSESOPHAGEAL ECHOCARDIOGRAM (TEE) (N/A) ENDOVEIN HARVEST OF GREATER SAPHENOUS VEIN (Right) Subjective: Responsive on vent, CXR clear, will wean and ewxtubate Postop expected blood loss anemia with preop plavix, transfuse 1 unit PRBC Impella 5.5  At P6 with flow 3.5 L/min  Objective: Vital signs in last 24 hours: Temp:  [97 F (36.1 C)-99.9 F (37.7 C)] 99.3 F (37.4 C) (09/14 0800) Pulse Rate:  [61-207] 77 (09/14 0822) Cardiac Rhythm: Normal sinus rhythm (09/14 0400) Resp:  [12-22] 15 (09/14 0822) BP: (87-124)/(62-100) 100/66 (09/14 0822) SpO2:  [85 %-100 %] 98 % (09/14 0822) Arterial Line BP: (87-140)/(54-85) 124/72 (09/14 0800) FiO2 (%):  [40 %-50 %] 40 % (09/14 0822) Weight:  [86.7 kg] 86.7 kg (09/14 0400)  Hemodynamic parameters for last 24 hours: PAP: (26-52)/(15-34) 31/15 CVP:  [8 mmHg-16 mmHg] 12 mmHg CO:  [4.9 L/min-8.2 L/min] 8.2 L/min CI:  [2.6 L/min/m2-4.4 L/min/m2] 4.4 L/min/m2  Intake/Output from previous day: 09/13 0701 - 09/14 0700 In: 7177.1 [I.V.:3176.7; Blood:1731; IV Piggyback:2107.7] Out: 2725 [Urine:1995; Chest Tube:730] Intake/Output this shift: Total I/O In: 297.8 [I.V.:132; Other:11.1; IV Piggyback:154.7] Out: 305 [Urine:265; Chest Tube:40]       Exam    General- responds to command    Neck- no JVD, no cervical adenopathy palpable, no carotid bruit   Lungs- clear without rales, wheezes   Cor- regular rate and rhythm, no murmur , gallop   Abdomen- soft, non-tender   Extremities - warm, non-tender, minimal edema   Neuro- oriented, appropriate, no focal weakness   Lab Results: Recent Labs    11/11/19 2300 11/11/19 2301 11/12/19 0400 11/12/19 0411  WBC 6.7  --  7.0  --   HGB 8.1*   < >  8.1* 7.8*  HCT 24.8*   < > 24.3* 23.0*  PLT 155  --  174  --    < > = values in this interval not displayed.   BMET:  Recent Labs    11/11/19 2300 11/11/19 2301 11/12/19 0400 11/12/19 0411  NA 138   < > 138 141  K 4.0   < > 4.4 4.3  CL 105  --  105  --   CO2 22  --  24  --   GLUCOSE 196*  --  130*  --   BUN 25*  --  24*  --   CREATININE 1.31*  --  1.20  --   CALCIUM 7.8*  --  7.8*  --    < > = values in this interval not displayed.    PT/INR:  Recent Labs    11/11/19 1630  LABPROT 16.2*  INR 1.4*   ABG    Component Value Date/Time   PHART 7.395 11/12/2019 0411   HCO3 26.6 11/12/2019 0411   TCO2 28 11/12/2019 0411   ACIDBASEDEF 4.0 (H) 11/11/2019 2301   O2SAT 99.0 11/12/2019 0411   CBG (last 3)  Recent Labs    11/12/19 0306 11/12/19 0408 11/12/19 0509  GLUCAP 130* 134* 137*    Assessment/Plan: S/P Procedure(s) (LRB): CORONARY ARTERY BYPASS GRAFTING (CABG) USING LIMA to Diag1; ENDOSCOPICALLY HARVESTED RIGHT GREATER SAPHENOUS VEIN: SVG to OM1; SVG to OM2; SVG to PDA. (N/A) PLACEMENT OF IMPELLA LEFT VENTRICULAR ASSIST DEVICE 5.5 (N/A) TRANSESOPHAGEAL ECHOCARDIOGRAM (TEE) (N/A) ENDOVEIN HARVEST OF GREATER SAPHENOUS  VEIN (Right) Mobilize Diuresis Diabetes control extubate  Cont low dose epi for RV function   LOS: 7 days    [name] 11/12/2019

## 2019-11-12 NOTE — Procedures (Signed)
Extubation Procedure Note  Patient Details:   Name: [name] DOB: [birth year] MRN: [id number]   Airway Documentation:    Vent end date: 11/12/19 Vent end time: 0854   Evaluation  O2 sats: stable throughout Complications: No apparent complications Patient did tolerate procedure well. Bilateral Breath Sounds: Clear, Diminished   Yes   Patient extubated per protocol to 4L [place] with no apparent complications. Positive cuff leak was noted prior to extubation. Patient achieved NIF of -22 and VC of .8L with good patient effort. Patient is alert and oriented and is able to weakly speak. Vitals are stable. RT will continue to monitor.  Erinn Huskins Clyda Greener 11/12/2019, 8:58 AM

## 2019-11-12 NOTE — Plan of Care (Signed)
  Problem: Education: Goal: Will demonstrate proper wound care and an understanding of methods to prevent future damage Outcome: Progressing Goal: Knowledge of disease or condition will improve Outcome: Progressing Goal: Knowledge of the prescribed therapeutic regimen will improve Outcome: Progressing   

## 2019-11-13 ENCOUNTER — Inpatient Hospital Stay (HOSPITAL_COMMUNITY): Payer: PPO

## 2019-11-13 LAB — LACTATE DEHYDROGENASE: LDH: 485 U/L — ABNORMAL HIGH (ref 98–192)

## 2019-11-13 LAB — GLUCOSE, CAPILLARY
Glucose-Capillary: 110 mg/dL — ABNORMAL HIGH (ref 70–99)
Glucose-Capillary: 120 mg/dL — ABNORMAL HIGH (ref 70–99)
Glucose-Capillary: 120 mg/dL — ABNORMAL HIGH (ref 70–99)
Glucose-Capillary: 123 mg/dL — ABNORMAL HIGH (ref 70–99)
Glucose-Capillary: 125 mg/dL — ABNORMAL HIGH (ref 70–99)
Glucose-Capillary: 131 mg/dL — ABNORMAL HIGH (ref 70–99)

## 2019-11-13 LAB — BASIC METABOLIC PANEL
Anion gap: 9 (ref 5–15)
Anion gap: 11 (ref 5–15)
BUN: 18 mg/dL (ref 8–23)
BUN: 19 mg/dL (ref 8–23)
CO2: 25 mmol/L (ref 22–32)
CO2: 25 mmol/L (ref 22–32)
Calcium: 7.7 mg/dL — ABNORMAL LOW (ref 8.9–10.3)
Calcium: 8 mg/dL — ABNORMAL LOW (ref 8.9–10.3)
Chloride: 100 mmol/L (ref 98–111)
Chloride: 96 mmol/L — ABNORMAL LOW (ref 98–111)
Creatinine, Ser: 1 mg/dL (ref 0.61–1.24)
Creatinine, Ser: 0.96 mg/dL (ref 0.61–1.24)
GFR calc Af Amer: >60 mL/min (ref >60)
GFR calc Af Amer: >60 mL/min (ref >60)
GFR calc non Af Amer: >60 mL/min (ref >60)
GFR calc non Af Amer: >60 mL/min (ref >60)
Glucose, Bld: 124 mg/dL — ABNORMAL HIGH (ref 70–99)
Glucose, Bld: 122 mg/dL — ABNORMAL HIGH (ref 70–99)
Potassium: 4 mmol/L (ref 3.5–5.1)
Potassium: 3.9 mmol/L (ref 3.5–5.1)
Sodium: 134 mmol/L — ABNORMAL LOW (ref 135–145)
Sodium: 132 mmol/L — ABNORMAL LOW (ref 135–145)

## 2019-11-13 LAB — CBC
HCT: 25.8 % — ABNORMAL LOW (ref 39.0–52.0)
HCT: 26.5 % — ABNORMAL LOW (ref 39.0–52.0)
Hemoglobin: 8.4 g/dL — ABNORMAL LOW (ref 13.0–17.0)
Hemoglobin: 8.7 g/dL — ABNORMAL LOW (ref 13.0–17.0)
MCH: 31.1 pg (ref 26.0–34.0)
MCH: 31.5 pg (ref 26.0–34.0)
MCHC: 32.6 g/dL (ref 30.0–36.0)
MCHC: 32.8 g/dL (ref 30.0–36.0)
MCV: 95.6 fL (ref 80.0–100.0)
MCV: 96 fL (ref 80.0–100.0)
Platelets: 116 10*3/uL — ABNORMAL LOW (ref 150–400)
Platelets: 128 10*3/uL — ABNORMAL LOW (ref 150–400)
RBC: 2.7 MIL/uL — ABNORMAL LOW (ref 4.22–5.81)
RBC: 2.76 MIL/uL — ABNORMAL LOW (ref 4.22–5.81)
RDW: 13.8 % (ref 11.5–15.5)
RDW: 13.6 % (ref 11.5–15.5)
WBC: 9.4 10*3/uL (ref 4.0–10.5)
WBC: 10.6 10*3/uL — ABNORMAL HIGH (ref 4.0–10.5)
nRBC: 0 % (ref 0.0–0.2)
nRBC: 0 % (ref 0.0–0.2)

## 2019-11-13 LAB — MAGNESIUM
Magnesium: 2.2 mg/dL (ref 1.7–2.4)
Magnesium: 2.2 mg/dL (ref 1.7–2.4)

## 2019-11-13 LAB — COOXEMETRY PANEL
Carboxyhemoglobin: 1.3 % (ref 0.5–1.5)
Methemoglobin: 1.3 % (ref 0.0–1.5)
O2 Saturation: 64.6 %
Total hemoglobin: 11.2 g/dL — ABNORMAL LOW (ref 12.0–16.0)

## 2019-11-13 LAB — HEPARIN LEVEL (UNFRACTIONATED)
Heparin Unfractionated: <0.1 IU/mL — ABNORMAL LOW (ref 0.30–0.70)
Heparin Unfractionated: <0.1 IU/mL — ABNORMAL LOW (ref 0.30–0.70)

## 2019-11-13 MED ORDER — POTASSIUM CHLORIDE 20 MEQ PO PACK
40.0000 meq | PACK | Freq: Once | ORAL | Status: AC
  Administered 2019-11-13: 40 meq via ORAL
  Filled 2019-11-13: qty 2

## 2019-11-13 MED ORDER — AMIODARONE HCL 200 MG PO TABS
200.0000 mg | ORAL_TABLET | Freq: Two times a day (BID) | ORAL | Status: DC
  Administered 2019-11-13: 200 mg via ORAL
  Filled 2019-11-13: qty 1

## 2019-11-13 MED ORDER — SPIRONOLACTONE 12.5 MG HALF TABLET
12.5000 mg | ORAL_TABLET | Freq: Every day | ORAL | Status: DC
  Administered 2019-11-13 – 2019-11-17 (×5): 12.5 mg via ORAL
  Filled 2019-11-13 (×5): qty 1

## 2019-11-13 MED ORDER — ALBUMIN HUMAN 5 % IV SOLN
12.5000 g | Freq: Once | INTRAVENOUS | Status: AC
  Administered 2019-11-13: 12.5 g via INTRAVENOUS
  Filled 2019-11-13: qty 250

## 2019-11-13 MED ORDER — PNEUMOCOCCAL VAC POLYVALENT 25 MCG/0.5ML IJ INJ: 0.5000 mL | INJECTION | INTRAMUSCULAR | Status: DC | PRN

## 2019-11-13 MED ORDER — POTASSIUM CHLORIDE 10 MEQ/50ML IV SOLN
10.0000 meq | INTRAVENOUS | Status: AC
  Administered 2019-11-13 (×2): 10 meq via INTRAVENOUS
  Filled 2019-11-13 (×2): qty 50

## 2019-11-13 MED ORDER — ALBUMIN HUMAN 25 % IV SOLN: 12.5000 g | Freq: Once | INTRAVENOUS | Status: DC

## 2019-11-13 MED ORDER — ORAL CARE MOUTH RINSE
15.0000 mL | Freq: Two times a day (BID) | OROMUCOSAL | Status: DC
  Administered 2019-11-13 – 2019-11-15 (×5): 15 mL via OROMUCOSAL

## 2019-11-13 MED ORDER — FUROSEMIDE 10 MG/ML IJ SOLN
80.0000 mg | Freq: Two times a day (BID) | INTRAMUSCULAR | Status: DC
  Administered 2019-11-13 – 2019-11-14 (×4): 80 mg via INTRAVENOUS
  Filled 2019-11-13 (×5): qty 8

## 2019-11-13 MED FILL — Magnesium Sulfate Inj 50%: INTRAMUSCULAR | Qty: 10 | Status: AC

## 2019-11-13 MED FILL — Potassium Chloride Inj 2 mEq/ML: INTRAVENOUS | Qty: 40 | Status: AC

## 2019-11-13 MED FILL — Heparin Sodium (Porcine) Inj 1000 Unit/ML: INTRAMUSCULAR | Qty: 30 | Status: AC

## 2019-11-13 NOTE — Progress Notes (Signed)
[place] for Heparin Indication: Impella 5.5  Allergies  Allergen Reactions  . Brilinta [Ticagrelor] Shortness Of Breath  . Chlorhexidine Gluconate Other (See Comments)    Skin burning for hours afterward  . Zetia [Ezetimibe] Other (See Comments)    Muscle aches  . Statins Other (See Comments)    Failed Crestor 5 mg twice weekly, Crestor 20 mg daily, Pravastatin 40 mg qd, Lipitor, Zocor - muscle aches    Patient Measurements: Height: 5\' 6"  (167.6 cm) Weight: 88 kg (194 lb 0.1 oz) IBW/kg (Calculated) : 63.8 Heparin Dosing Weight: 79.7 kg  Vital Signs: Temp: 99.7 F (37.6 C) (09/15 0615) Pulse Rate: 70 (09/15 0615)  Labs: Recent Labs    11/11/19 0328 11/11/19 0808 11/11/19 1630 11/11/19 2300 11/12/19 0400 11/12/19 0411 11/12/19 1039 11/12/19 1039 11/12/19 1625 11/13/19 0304  HGB 14.8   < > 9.6*   < > 8.1*   < > 8.5*   < > 9.1* 8.4*  HCT 45.3   < > 29.2*   < > 24.3*   < > 25.0*  --  27.5* 25.8*  PLT 213   < > 160   < > 174  --   --   --  141* 116*  APTT  --   --  28  --  41*  --   --   --   --   --   LABPROT  --   --  16.2*  --   --   --   --   --   --   --   INR  --   --  1.4*  --   --   --   --   --   --   --   HEPARINUNFRC 0.45  --   --   --  <0.10*  --   --   --   --  <0.10*  CREATININE 1.34*   < >  --    < > 1.20  --   --   --  1.14 1.00   < > = values in this interval not displayed.    Estimated Creatinine Clearance: 72.5 mL/min (by C-G formula based on SCr of 1 mg/dL).   Medical History: Past Medical History:  Diagnosis Date  . Abnormal nuclear cardiac imaging test 08/08/2015  . Arthritis    fingers  . Carotid artery occlusion   . Coronary atherosclerosis of native coronary artery 01/29/2013   11/05/19 R/LHC 80% dLMCA stenosis small diffusely dz dLAD, chronically occluded OM1 50%mRCA lesion, widely patent mLCx strent, moderately elevated L heart filling pressures, mild to moderate RH filling pressures, normal to  moderately reduced CO  . Duodenal erosion   . Encounter for screening for lung cancer 07/13/2016  . Esophageal stenosis    esophageal dilation  . GERD (gastroesophageal reflux disease)   . H. pylori infection   . Heart attack [facility]) Oct. 2009   Mild  . Hiatal hernia   . Hyperlipidemia   . Hypertension   . 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 Cardiac catheterization-nonobstructive 60% RCA disease-subsequent nuclear stress test-9 minutes, low risk, mild inferior wall hypokinesis   . Pain in limb 12/19/2017  . Peripheral vascular disease ([place])   . Unstable angina ([place]) 11/25/2017    Assessment: 69 yo M w/ multivessel CAD now s/p CABG x4 with placement of Impella 5.5 on 9/13. Pharmacy asked to monitor anticoagulation  with Impella.  Impella remains at P6 with heparin running through purge. Purge flow currently 11.1 ml/hr (555 units/hr). Fixed-rate systemic heparin started yesterday evening.  Heparin level this morning undetectable, LDH stable 485. Hgb down slightly 8.4, plts 116. Received 1 unit of PRBC 9/14. No bleeding or infusionper discussion with nursing.  Discussed with Dr. Prescott Gum and will increase systemic heparin slowly this morning.  Goal of Therapy:  Heparin level 0.2-0.3 units/ml  Monitor platelets by anticoagulation protocol: Yes   Plan:  Continue heparin through purge solution Increase systemic heparin to 400 units/hr Check 6-hr HL  Monitor daily LDH and CBC  Richardine Service, PharmD PGY2 Cardiology Pharmacy Resident Phone: [phone] 11/13/2019  7:10 AM  Please check [email or web address] for unit-specific pharmacy phone numbers.

## 2019-11-13 NOTE — Progress Notes (Signed)
2 Days Post-Op Procedure(s) (LRB): CORONARY ARTERY BYPASS GRAFTING (CABG) USING LIMA to Diag1; ENDOSCOPICALLY HARVESTED RIGHT GREATER SAPHENOUS VEIN: SVG to OM1; SVG to OM2; SVG to PDA. (N/A) PLACEMENT OF IMPELLA LEFT VENTRICULAR ASSIST DEVICE 5.5 (N/A) TRANSESOPHAGEAL ECHOCARDIOGRAM (TEE) (N/A) ENDOVEIN HARVEST OF GREATER SAPHENOUS VEIN (Right) Subjective: Uncomfortable in bed- will DC swan and mobilize to chair with PT Epi off, Impella down to P5 Objective: Vital signs in last 24 hours: Temp:  [99 F (37.2 C)-100.8 F (38.2 C)] 99.3 F (37.4 C) (09/15 0815) Pulse Rate:  [66-89] 68 (09/15 0815) Cardiac Rhythm: Normal sinus rhythm;Bundle branch block (09/15 0800) Resp:  [10-29] 22 (09/15 0815) BP: (101-121)/(77-84) 121/84 (09/14 1645) SpO2:  [84 %-98 %] 95 % (09/15 0815) Arterial Line BP: (91-162)/(55-78) 127/65 (09/15 0815) Weight:  [88 kg] 88 kg (09/15 0615)  Hemodynamic parameters for last 24 hours: PAP: (30-65)/(9-28) 43/18 CVP:  [9 mmHg-43 mmHg] 16 mmHg CO:  [4.4 L/min-5.4 L/min] 4.4 L/min CI:  [2.3 L/min/m2-2.9 L/min/m2] 2.3 L/min/m2  Intake/Output from previous day: 09/14 0701 - 09/15 0700 In: 3745.3 [P.O.:840; I.V.:1807.5; Blood:342.5; IV Piggyback:454.7] Out: 2815 [Urine:2575; Chest Tube:240] Intake/Output this shift: Total I/O In: 90 [I.V.:67.8; Other:22.2] Out: 100 [Urine:100]       Exam    General- alert and comfortable    Direct Impella secure in R neck    Neck- no JVD, no cervical adenopathy palpable, no carotid bruit   Lungs- clear without rales, wheezes   Cor- regular rate and rhythm, no murmur , gallop   Abdomen- soft, non-tender   Extremities - warm, non-tender, minimal edema   Neuro- oriented, appropriate, no focal weakness   Lab Results: Recent Labs    11/12/19 1625 11/13/19 0304  WBC 9.1 9.4  HGB 9.1* 8.4*  HCT 27.5* 25.8*  PLT 141* 116*   BMET:  Recent Labs    11/12/19 1625 11/13/19 0304  NA 136 134*  K 4.1 4.0  CL 103 100  CO2  23 25  GLUCOSE 141* 124*  BUN 20 18  CREATININE 1.14 1.00  CALCIUM 7.8* 7.7*    PT/INR:  Recent Labs    11/11/19 1630  LABPROT 16.2*  INR 1.4*   ABG    Component Value Date/Time   PHART 7.403 11/12/2019 1039   HCO3 25.0 11/12/2019 1039   TCO2 26 11/12/2019 1039   ACIDBASEDEF 4.0 (H) 11/11/2019 2301   O2SAT 64.6 11/13/2019 0304   CBG (last 3)  Recent Labs    11/13/19 0002 11/13/19 0412 11/13/19 0753  GLUCAP 125* 123* 131*    Assessment/Plan: S/P Procedure(s) (LRB): CORONARY ARTERY BYPASS GRAFTING (CABG) USING LIMA to Diag1; ENDOSCOPICALLY HARVESTED RIGHT GREATER SAPHENOUS VEIN: SVG to OM1; SVG to OM2; SVG to PDA. (N/A) PLACEMENT OF IMPELLA LEFT VENTRICULAR ASSIST DEVICE 5.5 (N/A) TRANSESOPHAGEAL ECHOCARDIOGRAM (TEE) (N/A) ENDOVEIN HARVEST OF GREATER SAPHENOUS VEIN (Right) Diuresis wean Impella Increase heparin slowly OOB to chair Leave chest drains  LOS: 8 days    [name] 11/13/2019

## 2019-11-13 NOTE — Progress Notes (Signed)
[place] for Heparin Indication: Impella 5.5  Allergies  Allergen Reactions  . Brilinta [Ticagrelor] Shortness Of Breath  . Chlorhexidine Gluconate Other (See Comments)    Skin burning for hours afterward  . Zetia [Ezetimibe] Other (See Comments)    Muscle aches  . Statins Other (See Comments)    Failed Crestor 5 mg twice weekly, Crestor 20 mg daily, Pravastatin 40 mg qd, Lipitor, Zocor - muscle aches    Patient Measurements: Height: 5\' 6"  (167.6 cm) Weight: 88 kg (194 lb 0.1 oz) IBW/kg (Calculated) : 63.8 Heparin Dosing Weight: 79.7 kg  Vital Signs: Temp: 99.1 F (37.3 C) (09/15 1015) Temp Source: Core (09/15 0800) Pulse Rate: 64 (09/15 1500)  Labs: Recent Labs    11/11/19 1630 11/11/19 2300 11/12/19 0400 11/12/19 0411 11/12/19 1625 11/12/19 1625 11/13/19 0304 11/13/19 1512  HGB 9.6*   < > 8.1*   < > 9.1*   < > 8.4* 8.7*  HCT 29.2*   < > 24.3*   < > 27.5*  --  25.8* 26.5*  PLT 160   < > 174   < > 141*  --  116* 128*  APTT 28  --  41*  --   --   --   --   --   LABPROT 16.2*  --   --   --   --   --   --   --   INR 1.4*  --   --   --   --   --   --   --   HEPARINUNFRC  --   --  <0.10*  --   --   --  <0.10* <0.10*  CREATININE  --    < > 1.20   < > 1.14  --  1.00 0.96   < > = values in this interval not displayed.    Estimated Creatinine Clearance: 75.5 mL/min (by C-G formula based on SCr of 0.96 mg/dL).   Medical History: Past Medical History:  Diagnosis Date  . Abnormal nuclear cardiac imaging test 08/08/2015  . Arthritis    fingers  . Carotid artery occlusion   . Coronary atherosclerosis of native coronary artery 01/29/2013   11/05/19 R/LHC 80% dLMCA stenosis small diffusely dz dLAD, chronically occluded OM1 50%mRCA lesion, widely patent mLCx strent, moderately elevated L heart filling pressures, mild to moderate RH filling pressures, normal to moderately reduced CO  . Duodenal erosion   . Encounter for screening for  lung cancer 07/13/2016  . Esophageal stenosis    esophageal dilation  . GERD (gastroesophageal reflux disease)   . H. pylori infection   . Heart attack [facility]) Oct. 2009   Mild  . Hiatal hernia   . Hyperlipidemia   . Hypertension   . 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 Cardiac catheterization-nonobstructive 60% RCA disease-subsequent nuclear stress test-9 minutes, low risk, mild inferior wall hypokinesis   . Pain in limb 12/19/2017  . Peripheral vascular disease ([place])   . Unstable angina ([place]) 11/25/2017    Assessment: 69 yo M w/ multivessel CAD now s/p CABG x4 with placement of Impella 5.5 on 9/13. Pharmacy asked to monitor anticoagulation with Impella.  Impella remains at P6 with heparin running through purge. Purge flow currently 11.1 ml/hr (555 units/hr). Fixed-rate systemic heparin started yesterday evening.  Heparin level thisevening undetectable. Discussed with Dr. Prescott Gum and will increase systemic heparin slowly this morning.  Goal  of Therapy:  Heparin level 0.2-0.3 units/ml  Monitor platelets by anticoagulation protocol: Yes   Plan:  Continue heparin through purge solution Increase systemic heparin to 500 units/hr Check am HL  Monitor daily LDH and CBC  Alanda Slim, PharmD, [facility] Clinical Pharmacist Please see AMION for all Pharmacists' Contact Phone Numbers 11/13/2019, 4:17 PM

## 2019-11-13 NOTE — Evaluation (Signed)
Physical Therapy Evaluation Patient Details Name: [name] MRN: [id number] DOB: 12-Dec-1950 Today's Date: 11/13/2019   History of Present Illness  Pt admitted after cath with CAD and ICM. Pt s/p high risk CABG with impella 5.5 placement on 9/13. PMhx: CAD, NSTEMI, LBBB, NICM, PVD s/p mechanical thrombectomy, HTN, HLD, hiatal hernia  Clinical Impression  Pt pleasant with girlfriend and RN present throughout. Pt normally independent and golfing currently with limited mobility, strength, cardiopulmonary function and activity tolerance who will benefit from acute therapy to maximize function and independence. Pt educated for precautions, transfers, HEP and progression.   HR 64 SpO2 94% on 2L impella P-5    Follow Up Recommendations Supervision/Assistance - 24 hour;CIR    Equipment Recommendations  Rolling walker with 5" wheels;3in1 (PT)    Recommendations for Other Services       Precautions / Restrictions Precautions Precautions: Other (comment);Sternal Precaution Booklet Issued: No Precaution Comments: impella, chest tube, external pacer Restrictions Weight Bearing Restrictions: Yes (sternal precautions, Impella surgically implanted)      Mobility  Bed Mobility Overal bed mobility: Needs Assistance             General bed mobility comments: pt in partial chair position on arrival and utilized full chair for foot egress  Transfers Overall transfer level: Needs assistance   Transfers: Sit to/from Stand;Stand Pivot Transfers Sit to Stand: Mod assist;+2 safety/equipment Stand pivot transfers: Min assist       General transfer comment: mod assist to rise from foot egress x 2 trials and additional trial from recliner. +2 for impella and lines. Pivot with min assit with support of belt and cues for sequence  Ambulation/Gait                Stairs            Wheelchair Mobility    Modified Rankin (Stroke Patients Only)       Balance Overall  balance assessment: Mild deficits observed, not formally tested                                           Pertinent Vitals/Pain Pain Assessment: 0-10 Pain Score: 5  Pain Location: chest Pain Descriptors / Indicators: Aching Pain Intervention(s): Limited activity within patient's tolerance;Repositioned    Home Living Family/patient expects to be discharged to:: Private residence Living Arrangements: Spouse/significant other Available Help at Discharge: Available 24 hours/day;Friend(s) Type of Home: House Home Access: Stairs to enter Entrance Stairs-Rails: Psychiatric nurse of Steps: 3 Home Layout: One level Home Equipment: None      Prior Function Level of Independence: Independent         Comments: enjoys Engineer, building services Dominance        Extremity/Trunk Assessment   Upper Extremity Assessment Upper Extremity Assessment: Generalized weakness    Lower Extremity Assessment Lower Extremity Assessment: Generalized weakness    Cervical / Trunk Assessment Cervical / Trunk Assessment: Normal  Communication   Communication: No difficulties  Cognition Arousal/Alertness: Awake/alert Behavior During Therapy: WFL for tasks assessed/performed Overall Cognitive Status: Impaired/Different from baseline Area of Impairment: Memory                     Memory: Decreased recall of precautions                General Comments  Exercises General Exercises - Lower Extremity Portee Arc Quad: AAROM;Both;Seated;10 reps (AROM first 4 reps then required assist) Hip Flexion/Marching: AAROM;Both;10 reps;Seated   Assessment/Plan    PT Assessment Patient needs continued PT services  PT Problem List Decreased strength;Decreased mobility;Decreased activity tolerance;Decreased knowledge of use of DME;Decreased knowledge of precautions       PT Treatment Interventions DME instruction;Therapeutic exercise;Gait training;Functional  mobility training;Therapeutic activities;Patient/family education    PT Goals (Current goals can be found in the Care Plan section)  Acute Rehab PT Goals Patient Stated Goal: return to golf PT Goal Formulation: With patient/family Time For Goal Achievement: 11/27/19 Potential to Achieve Goals: Good    Frequency Min 3X/week   Barriers to discharge        Co-evaluation               AM-PAC PT "6 Clicks" Mobility  Outcome Measure Help needed turning from your back to your side while in a flat bed without using bedrails?: A Lot Help needed moving from lying on your back to sitting on the side of a flat bed without using bedrails?: A Lot Help needed moving to and from a bed to a chair (including a wheelchair)?: A Lot Help needed standing up from a chair using your arms (e.g., wheelchair or bedside chair)?: A Lot Help needed to walk in hospital room?: A Lot Help needed climbing 3-5 steps with a railing? : Total 6 Click Score: 11    End of Session Equipment Utilized During Treatment: Gait belt Activity Tolerance: Patient tolerated treatment well Patient left: in chair;with call bell/phone within reach;with family/visitor present;with nursing/sitter in room Nurse Communication: Mobility status;Precautions PT Visit Diagnosis: Other abnormalities of gait and mobility (R26.89);Difficulty in walking, not elsewhere classified (R26.2);Muscle weakness (generalized) (M62.81)    Time: 8527-7824 PT Time Calculation (min) (ACUTE ONLY): 31 min   Charges:   PT Evaluation $PT Eval High Complexity: 1 High PT Treatments $Therapeutic Activity: 8-22 mins        Shefali Ng P, PT Acute Rehabilitation Services Pager: [phone] Office: [phone]   Argie Applegate B Barnet Benavides 11/13/2019, 11:35 AM

## 2019-11-13 NOTE — Progress Notes (Signed)
° °   °  [address]       [place] [address]             [phone]      POD # 2 CABG, Impella  Was up to chair earlier, now resting in bed  BP 121/84    Pulse 64    Temp 97.6 F (36.4 C) (Oral)    Resp (!) 8    Ht 5\' 6"  (1.676 m)    Wt 88 kg    SpO2 93%    BMI 31.31 kg/m  Impella @3  l/min   Intake/Output Summary (Last 24 hours) at 11/13/2019 1710 Last data filed at 11/13/2019 1700 Gross per 24 hour  Intake 2315.81 ml  Output 2475 ml  Net -159.19 ml   K= 3.9, creatinine 0.96, Hct= 27  Continue current Rx  Lewie Deman C. Roxan Hockey, MD [facility] [phone]

## 2019-11-13 NOTE — Progress Notes (Addendum)
Patient ID: [name], male   DOB: 1950-06-28, 69 y.o.   MRN: [id number]     Advanced Heart Failure Rounding Note  PCP-Cardiologist: Yvonne Kendall, MD   Subjective:   S/P CABG with direct Impella placed with difficulty weaning from bypass yesterday.   Extubated POD#1. Stared on IV lasix. Remains +. CVP up to 15-16.   On Epi 1 mcg +milrinone 0.25 mcg. CO-OX 65%.   Impella 5.5 P6- No alarms Flow 3.4 CPO 0.8  CO 4.8  LDH 485  [place]: PAP: (29-65)/(9-28) 39/19 CVP:  [9 mmHg-43 mmHg] 16 mmHg CO:  [4.8 L/min-8.2 L/min] 4.8 L/min CI:  [2.6 L/min/m2-4.4 L/min/m2] 2.6 L/min/m2   Complaining of burning associated with CHG wipes. Had trouble sleeping. Denies SOB.    Objective:   Weight Range: 88 kg Body mass index is 31.31 kg/m.   Vital Signs:   Temp:  [99 F (37.2 C)-100.8 F (38.2 C)] 99.7 F (37.6 C) (09/15 0615) Pulse Rate:  [61-90] 70 (09/15 0615) Resp:  [10-29] 12 (09/15 0615) BP: (100-121)/(66-84) 121/84 (09/14 1645) SpO2:  [84 %-99 %] 95 % (09/15 0615) Arterial Line BP: (91-162)/(55-78) 124/65 (09/15 0615) FiO2 (%):  [40 %-50 %] 40 % (09/14 0822) Weight:  [88 kg] 88 kg (09/15 0615) Last BM Date: 11/09/19  Weight change: Filed Weights   11/11/19 0447 11/12/19 0400 11/13/19 0615  Weight: 78 kg 86.7 kg 88 kg    Intake/Output:   Intake/Output Summary (Last 24 hours) at 11/13/2019 0722 Last data filed at 11/13/2019 0700 Gross per 24 hour  Intake 3676 ml  Output 2815 ml  Net 861 ml      Physical Exam  CVP 15-16 General:   No resp difficulty HEENT: normal Neck: supple. JVP to jaw. Carotids 2+ bilat; no bruits. No lymphadenopathy or thryomegaly appreciated. RIJ  Cor: PMI nondisplaced. Regular rate & rhythm. No rubs, gallops or murmurs. Direct Impella  Lungs: clear on 4 liters Abdomen: soft, nontender, nondistended. No hepatosplenomegaly. No bruits or masses. Good bowel sounds. Extremities: no cyanosis, clubbing, rash, edema Neuro: alert & orientedx3,  cranial nerves grossly intact. moves all 4 extremities w/o difficulty. Affect pleasant    Telemetry   SR with occasional PVCs.   Labs    CBC Recent Labs    11/12/19 1625 11/13/19 0304  WBC 9.1 9.4  HGB 9.1* 8.4*  HCT 27.5* 25.8*  MCV 95.2 95.6  PLT 141* 116*   Basic Metabolic Panel Recent Labs    95/37/96 0400 11/12/19 0411 11/12/19 1625 11/13/19 0304  NA 138   < > 136 134*  K 4.4   < > 4.1 4.0  CL 105   < > 103 100  CO2 24   < > 23 25  GLUCOSE 130*   < > 141* 124*  BUN 24*   < > 20 18  CREATININE 1.20   < > 1.14 1.00  CALCIUM 7.8*   < > 7.8* 7.7*  MG 2.7*  --  2.3  --    < > = values in this interval not displayed.   Liver Function Tests No results for input(s): AST, ALT, ALKPHOS, BILITOT, PROT, ALBUMIN in the last 72 hours. No results for input(s): LIPASE, AMYLASE in the last 72 hours. Cardiac Enzymes No results for input(s): CKTOTAL, CKMB, CKMBINDEX, TROPONINI in the last 72 hours.  BNP: BNP (last 3 results) Recent Labs    11/05/19 2228  BNP 468.7*    ProBNP (last 3 results) No results for input(s):  PROBNP in the last 8760 hours.   D-Dimer No results for input(s): DDIMER in the last 72 hours. Hemoglobin A1C No results for input(s): HGBA1C in the last 72 hours. Fasting Lipid Panel No results for input(s): CHOL, HDL, LDLCALC, TRIG, CHOLHDL, LDLDIRECT in the last 72 hours. Thyroid Function Tests No results for input(s): TSH, T4TOTAL, T3FREE, THYROIDAB in the last 72 hours.  Invalid input(s): FREET3  Other results:   Imaging    ECHOCARDIOGRAM LIMITED  Result Date: 11/12/2019    ECHOCARDIOGRAM LIMITED REPORT   Patient Name:   JC VERON Soulliere Date of Exam: 11/12/2019 Medical Rec #:  [id number]    Height:       66.0 in Accession #:    [id number]   Weight:       191.1 lb Date of Birth:  1951/02/25   BSA:          1.962 m Patient Age:    44 years     BP:           105/82 mmHg Patient Gender: M            HR:           87 bpm. Exam Location:  Inpatient  Procedure: Limited Echo Indications:    Impella Placement  History:        Patient has prior history of Echocardiogram examinations, most                 recent 11/11/2019. Risk Factors:Hypertension and Dyslipidemia.  Sonographer:    Mikki Santee RDCS (AE) Referring Phys: [id number] Elby Showers [facility]  Sonographer Comments: IMPRESSIONS  1. 2D echo done for Impella placement. LV function severely reduced. Catheter tip is 4.8cm from AV. FINDINGS  Left Ventricle: 2D echo done for Impella placement. LV function severely reduced. Catheter tip is 4.8cm from AV. Fransico Him MD Electronically signed by Fransico Him MD Signature Date/Time: 11/12/2019/10:04:34 AM    Final      Medications:     Scheduled Medications: . acetaminophen  1,000 mg Oral Q6H   Or  . acetaminophen (TYLENOL) oral liquid 160 mg/5 mL  1,000 mg Per Tube Q6H  . aspirin EC  325 mg Oral Daily   Or  . aspirin  324 mg Per Tube Daily  . bisacodyl  10 mg Oral Daily   Or  . bisacodyl  10 mg Rectal Daily  . chlorhexidine gluconate (MEDLINE KIT)  15 mL Mouth Rinse BID  . Chlorhexidine Gluconate Cloth  6 each Topical Daily  . docusate sodium  200 mg Oral Daily  . furosemide  40 mg Intravenous BID  . insulin aspart  0-15 Units Subcutaneous Q4H  . mouth rinse  15 mL Mouth Rinse QID  . mupirocin ointment  1 application Nasal BID  . pantoprazole  40 mg Oral Daily  . pneumococcal 23 valent vaccine  0.5 mL Intramuscular Tomorrow-1000  . sodium chloride flush  10-40 mL Intracatheter Q12H  . sodium chloride flush  3 mL Intravenous Q12H    Infusions: . sodium chloride 20 mL/hr at 11/13/19 0600  . sodium chloride    . sodium chloride 10 mL/hr at 11/11/19 2047  . amiodarone 30 mg/hr (11/13/19 0600)  . dexmedetomidine (PRECEDEX) IV infusion Stopped (11/12/19 0848)  . epinephrine 1 mcg/min (11/13/19 0600)  . impella catheter heparin 50 unit/mL in dextrose 5%    . heparin 300 Units/hr (11/13/19 0600)  . insulin Stopped (11/12/19 1251)  .  lactated ringers    .  lactated ringers 20 mL/hr at 11/13/19 0600  . milrinone 0.25 mcg/kg/min (11/13/19 0600)  . norepinephrine (LEVOPHED) Adult infusion Stopped (11/12/19 1424)    PRN Medications: sodium chloride, dextrose, LORazepam, metoprolol tartrate, midazolam, morphine injection, ondansetron (ZOFRAN) IV, oxyCODONE, sodium chloride flush, sodium chloride flush, traMADol    Assessment/Plan   1. S/P CABG x4 wit direct impella placement 11/11/19  Pre Op weight 171-->194  Direct Imepella - P6 Flow 3.4 CPO 0.8 .  Remains on low dose epi 1 mcg +  milrinone 0.25 mcg. On Heparin drip.  CO-OX ok. Output ok. Should be able to come off epi.  CVP 16. Increase lasix to 80 mg twice a day.   - On aspirin - Hold bb for now.   2. CAD: H/o PCI to LCX in 2017. LHC 11/05/19 with multivessel CAD, including calcified 80% distal LMCA stenosis, small diffusely diseased distal LAD, chronically occluded OM1, and 50% mid RCA lesion. - Intolerance of statins, he is on Praluent at home.   3. . Acute systolic CHF: Ischemic cardiomyopathy.  Echo with EF 25-30%.   - If EF remains low after CABG, will be CRT-D candidate with LBBB.  - No bb for now.  - CVP up as above increase lasix to 80 mg twice a day  - Add 12.5 mg spiro daily.   4. AKI:  Renal function stable.   5. PVCs: Frequent.   - Now on amiodarone. Started on IV amio 9/14 for increased ectopy.  - Continue for now.   6. LBBB  7. Anemia, expected blood loss Hgb 8.4   Mobilize today.    Length of Stay: [place], NP  11/13/2019, 7:22 AM  Advanced Heart Failure Team Pager [phone] (M-F; 7a - 4p)  Please contact [facility] for night-coverage after hours (4p -7a ) and weekends on [email or web address]  Patient seen with NP, agree with the above note.   Extubated.  I/Os positive yesterday. Good co-ox and CO.  CVP 15-16.  Did not sleep well.   Rhythm difficult, ?NSR.   General: NAD Neck: JVP 12-14 cm, no thyromegaly or thyroid nodule.    Lungs: Decreased at bases.  CV: Nondisplaced PMI.  Heart regular S1/S2, no S3/S4, no murmur. Impella sounds.  No peripheral edema.   Abdomen: Soft, nontender, no hepatosplenomegaly, no distention.  Skin: Intact without lesions or rashes.  Neurologic: Alert and oriented x 3.  Psych: Normal affect. Extremities: No clubbing or cyanosis.  HEENT: Normal.   1. CAD: H/o PCI to LCX in 2017. LHC 9/7/21with multivessel CAD, including calcified 80% distal LMCA stenosis, small diffusely diseased distal LAD, chronically occluded OM1, and 50% mid RCA lesion.  Now s/p CABG x 4 on 9/13 with LIMA-LAD, SVG-OM1, SVG-OM2, SVG-PDA.  - Continue ASA 81 - Intolerance of statins, he is on Praluent at home.  2. Cardiogenic shock/acute systolic CHF: Ischemic cardiomyopathy. Echo pre-op with EF 25-30%. After CABG, difficult to wean from bypass with RV dysfunction, Impella 5.5 placed.  Echo 9/14 limited with EF 25%, normal RV size with mildly decreased systolic function.  Stable this morning with CI 2.6 and co-ox 65% on P6 Impella, milrinone 0.25, epinephrine 1.  CVP up to 15-16.  LDH stable.  - Stop epinephrine today, continue milrinone.  - Decrease Impella to P5 today.  He remains on low dose heparin gtt with Impella.  - Increase Lasix to 80 mg IV bid.  - If EF remains low after CABG, will be CRT-D candidate with LBBB.  -  Add low dose spironolactone.  3. BZM:CEYE diuresis and losartan, now off. Resolved.  4. PVCs: Frequent. Now on IV amiodarone.   - ECG today to check rhythm => junctional rhythm around 80 bpm.  5. Acute hypoxemic respiratory failure: Resolved, extubated.   6. Anemia: Stable.   CRITICAL CARE Performed by: Loralie Champagne  Total critical care time: 40 minutes  Critical care time was exclusive of separately billable procedures and treating other patients.  Critical care was necessary to treat or prevent imminent or life-threatening deterioration.  Critical care was time spent personally by  me on the following activities: development of treatment plan with patient and/or surrogate as well as nursing, discussions with consultants, evaluation of patient's response to treatment, examination of patient, obtaining history from patient or surrogate, ordering and performing treatments and interventions, ordering and review of laboratory studies, ordering and review of radiographic studies, pulse oximetry and re-evaluation of patient's condition.  Loralie Champagne 11/13/2019 8:27 AM

## 2019-11-14 ENCOUNTER — Inpatient Hospital Stay (HOSPITAL_COMMUNITY): Payer: PPO

## 2019-11-14 DIAGNOSIS — Z95811 Presence of heart assist device: Secondary | ICD-10-CM

## 2019-11-14 LAB — BPAM RBC
Blood Product Expiration Date: 202110142359
Blood Product Expiration Date: 202110152359
Blood Product Expiration Date: 202110152359
Blood Product Expiration Date: 202110152359
ISSUE DATE / TIME: 202109140849
Unit Type and Rh: 5100
Unit Type and Rh: 5100
Unit Type and Rh: 5100
Unit Type and Rh: 5100

## 2019-11-14 LAB — TYPE AND SCREEN
ABO/RH(D): B POS
Antibody Screen: NEGATIVE
Unit division: 0
Unit division: 0
Unit division: 0
Unit division: 0

## 2019-11-14 LAB — COMPREHENSIVE METABOLIC PANEL
ALT: 20 U/L (ref 0–44)
AST: 29 U/L (ref 15–41)
Albumin: 3.1 g/dL — ABNORMAL LOW (ref 3.5–5.0)
Alkaline Phosphatase: 41 U/L (ref 38–126)
Anion gap: 9 (ref 5–15)
BUN: 22 mg/dL (ref 8–23)
CO2: 26 mmol/L (ref 22–32)
Calcium: 7.8 mg/dL — ABNORMAL LOW (ref 8.9–10.3)
Chloride: 99 mmol/L (ref 98–111)
Creatinine, Ser: 1.07 mg/dL (ref 0.61–1.24)
GFR calc Af Amer: >60 mL/min (ref >60)
GFR calc non Af Amer: >60 mL/min (ref >60)
Glucose, Bld: 112 mg/dL — ABNORMAL HIGH (ref 70–99)
Potassium: 3.8 mmol/L (ref 3.5–5.1)
Sodium: 134 mmol/L — ABNORMAL LOW (ref 135–145)
Total Bilirubin: 0.9 mg/dL (ref 0.3–1.2)
Total Protein: 5.5 g/dL — ABNORMAL LOW (ref 6.5–8.1)

## 2019-11-14 LAB — POCT I-STAT 7, (LYTES, BLD GAS, ICA,H+H)
Acid-Base Excess: 6 mmol/L — ABNORMAL HIGH (ref 0.0–2.0)
Bicarbonate: 32 mmol/L — ABNORMAL HIGH (ref 20.0–28.0)
Calcium, Ion: 1.1 mmol/L — ABNORMAL LOW (ref 1.15–1.40)
HCT: 27 % — ABNORMAL LOW (ref 39.0–52.0)
Hemoglobin: 9.2 g/dL — ABNORMAL LOW (ref 13.0–17.0)
O2 Saturation: 95 %
Patient temperature: 37.4
Potassium: 3.5 mmol/L (ref 3.5–5.1)
Sodium: 133 mmol/L — ABNORMAL LOW (ref 135–145)
TCO2: 34 mmol/L — ABNORMAL HIGH (ref 22–32)
pCO2 arterial: 53.1 mmHg — ABNORMAL HIGH (ref 32.0–48.0)
pH, Arterial: 7.39 (ref 7.350–7.450)
pO2, Arterial: 83 mmHg (ref 83.0–108.0)

## 2019-11-14 LAB — LACTATE DEHYDROGENASE: LDH: 386 U/L — ABNORMAL HIGH (ref 98–192)

## 2019-11-14 LAB — COOXEMETRY PANEL
Carboxyhemoglobin: 1.5 % (ref 0.5–1.5)
Methemoglobin: 1.5 % (ref 0.0–1.5)
O2 Saturation: 63.1 %
Total hemoglobin: 8.1 g/dL — ABNORMAL LOW (ref 12.0–16.0)

## 2019-11-14 LAB — PREPARE RBC (CROSSMATCH)

## 2019-11-14 LAB — CBC
HCT: 23.7 % — ABNORMAL LOW (ref 39.0–52.0)
Hemoglobin: 7.6 g/dL — ABNORMAL LOW (ref 13.0–17.0)
MCH: 30.5 pg (ref 26.0–34.0)
MCHC: 32.1 g/dL (ref 30.0–36.0)
MCV: 95.2 fL (ref 80.0–100.0)
Platelets: 122 10*3/uL — ABNORMAL LOW (ref 150–400)
RBC: 2.49 MIL/uL — ABNORMAL LOW (ref 4.22–5.81)
RDW: 13.3 % (ref 11.5–15.5)
WBC: 10.3 10*3/uL (ref 4.0–10.5)
nRBC: 0 % (ref 0.0–0.2)

## 2019-11-14 LAB — GLUCOSE, CAPILLARY
Glucose-Capillary: 114 mg/dL — ABNORMAL HIGH (ref 70–99)
Glucose-Capillary: 114 mg/dL — ABNORMAL HIGH (ref 70–99)
Glucose-Capillary: 115 mg/dL — ABNORMAL HIGH (ref 70–99)
Glucose-Capillary: 134 mg/dL — ABNORMAL HIGH (ref 70–99)
Glucose-Capillary: 135 mg/dL — ABNORMAL HIGH (ref 70–99)
Glucose-Capillary: 89 mg/dL (ref 70–99)

## 2019-11-14 LAB — HEPARIN LEVEL (UNFRACTIONATED)
Heparin Unfractionated: 0.79 IU/mL — ABNORMAL HIGH (ref 0.30–0.70)
Heparin Unfractionated: <0.1 IU/mL — ABNORMAL LOW (ref 0.30–0.70)
Heparin Unfractionated: <0.1 IU/mL — ABNORMAL LOW (ref 0.30–0.70)

## 2019-11-14 LAB — MAGNESIUM: Magnesium: 2.1 mg/dL (ref 1.7–2.4)

## 2019-11-14 MED ORDER — BISACODYL 5 MG PO TBEC: 5.0000 mg | DELAYED_RELEASE_TABLET | Freq: Once | ORAL | Status: DC

## 2019-11-14 MED ORDER — AMIODARONE HCL IN DEXTROSE 360-4.14 MG/200ML-% IV SOLN
30.0000 mg/h | INTRAVENOUS | Status: DC
  Administered 2019-11-14 (×2): 60 mg/h via INTRAVENOUS
  Administered 2019-11-15 – 2019-11-17 (×5): 30 mg/h via INTRAVENOUS
  Administered 2019-11-17 – 2019-11-18 (×6): 60 mg/h via INTRAVENOUS
  Administered 2019-11-19: 30 mg/h via INTRAVENOUS
  Administered 2019-11-19: 60 mg/h via INTRAVENOUS
  Administered 2019-11-19: 30 mg/h via INTRAVENOUS
  Filled 2019-11-14 (×19): qty 200

## 2019-11-14 MED ORDER — AMIODARONE LOAD VIA INFUSION
150.0000 mg | Freq: Once | INTRAVENOUS | Status: DC
  Filled 2019-11-14: qty 83.34

## 2019-11-14 MED ORDER — ENSURE ENLIVE PO LIQD
237.0000 mL | Freq: Three times a day (TID) | ORAL | Status: DC
  Administered 2019-11-14 – 2019-11-18 (×5): 237 mL via ORAL

## 2019-11-14 MED ORDER — POTASSIUM CHLORIDE 10 MEQ/50ML IV SOLN
10.0000 meq | INTRAVENOUS | Status: AC
  Administered 2019-11-14 (×2): 10 meq via INTRAVENOUS
  Filled 2019-11-14 (×2): qty 50

## 2019-11-14 MED ORDER — ALPRAZOLAM 0.25 MG PO TABS
0.2500 mg | ORAL_TABLET | ORAL | Status: DC | PRN
  Administered 2019-11-15: 0.5 mg via ORAL
  Filled 2019-11-14: qty 2

## 2019-11-14 MED ORDER — AMIODARONE HCL IN DEXTROSE 360-4.14 MG/200ML-% IV SOLN
30.0000 mg/h | INTRAVENOUS | Status: DC
  Administered 2019-11-14: 30 mg/h via INTRAVENOUS
  Filled 2019-11-14: qty 200

## 2019-11-14 MED ORDER — MIDAZOLAM HCL 2 MG/2ML IJ SOLN: 2.0000 mg | Freq: Four times a day (QID) | INTRAMUSCULAR | Status: DC | PRN

## 2019-11-14 MED ORDER — AMIODARONE IV BOLUS ONLY 150 MG/100ML
150.0000 mg | Freq: Once | INTRAVENOUS | Status: AC
  Administered 2019-11-14: 150 mg via INTRAVENOUS
  Filled 2019-11-14: qty 100

## 2019-11-14 MED ORDER — POTASSIUM CHLORIDE 10 MEQ/50ML IV SOLN
INTRAVENOUS | Status: AC
  Administered 2019-11-14: 10 meq via INTRAVENOUS
  Filled 2019-11-14: qty 50

## 2019-11-14 MED ORDER — AMIODARONE IV BOLUS ONLY 150 MG/100ML
150.0000 mg | Freq: Once | INTRAVENOUS | Status: AC
  Administered 2019-11-14: 150 mg via INTRAVENOUS

## 2019-11-14 MED ORDER — LEVALBUTEROL HCL 0.63 MG/3ML IN NEBU
0.6300 mg | INHALATION_SOLUTION | Freq: Three times a day (TID) | RESPIRATORY_TRACT | Status: DC
  Administered 2019-11-14 – 2019-11-16 (×6): 0.63 mg via RESPIRATORY_TRACT
  Filled 2019-11-14 (×6): qty 3

## 2019-11-14 MED ORDER — DIGOXIN 125 MCG PO TABS
0.1250 mg | ORAL_TABLET | Freq: Every day | ORAL | Status: DC
  Administered 2019-11-14 – 2019-11-21 (×8): 0.125 mg via ORAL
  Filled 2019-11-14 (×8): qty 1

## 2019-11-14 MED ORDER — AMIODARONE LOAD VIA INFUSION
150.0000 mg | Freq: Once | INTRAVENOUS | Status: AC
  Administered 2019-11-14: 150 mg via INTRAVENOUS
  Filled 2019-11-14: qty 83.34

## 2019-11-14 MED ORDER — TEMAZEPAM 15 MG PO CAPS
15.0000 mg | ORAL_CAPSULE | Freq: Once | ORAL | Status: AC | PRN
  Administered 2019-11-14: 15 mg via ORAL
  Filled 2019-11-14: qty 1

## 2019-11-14 MED ORDER — MILRINONE LACTATE IN DEXTROSE 20-5 MG/100ML-% IV SOLN
0.1250 ug/kg/min | INTRAVENOUS | Status: DC
  Administered 2019-11-15 – 2019-11-18 (×6): 0.25 ug/kg/min via INTRAVENOUS
  Administered 2019-11-19: 0.125 ug/kg/min via INTRAVENOUS
  Filled 2019-11-14 (×6): qty 100

## 2019-11-14 MED FILL — Calcium Chloride Inj 10%: INTRAVENOUS | Qty: 10 | Status: AC

## 2019-11-14 MED FILL — Electrolyte-R (PH 7.4) Solution: INTRAVENOUS | Qty: 7000 | Status: AC

## 2019-11-14 MED FILL — Lidocaine HCl Local Soln Prefilled Syringe 100 MG/5ML (2%): INTRAMUSCULAR | Qty: 5 | Status: AC

## 2019-11-14 MED FILL — Heparin Sodium (Porcine) Inj 1000 Unit/ML: INTRAMUSCULAR | Qty: 30 | Status: AC

## 2019-11-14 MED FILL — Sodium Chloride IV Soln 0.9%: INTRAVENOUS | Qty: 6000 | Status: AC

## 2019-11-14 MED FILL — Mannitol IV Soln 20%: INTRAVENOUS | Qty: 500 | Status: AC

## 2019-11-14 MED FILL — Sodium Bicarbonate IV Soln 8.4%: INTRAVENOUS | Qty: 100 | Status: AC

## 2019-11-14 MED FILL — Albumin, Human Inj 5%: INTRAVENOUS | Qty: 500 | Status: AC

## 2019-11-14 NOTE — Progress Notes (Addendum)
Patient ID: [name], male   DOB: 07/24/1950, 69 y.o.   MRN: [id number]     Advanced Heart Failure Rounding Note  PCP-Cardiologist: Nelva Bush, MD   Subjective:   S/P CABG with direct Impella placed with difficulty weaning from bypass yesterday.   Extubated POD#2.   Yesterday IV lasix increased to 80 mg twice a day. CVP remains elevated. Negative 1 liter. CVP remain 13-14 .   Overnight went in A fib RVR. Given amio bolus and started on norepi.   On norepi 3 mcg + milrinone 0.25 mcg. CO-OX 63% .   Impella 5.5 P5- No alarms Flow 2.7  LDH 386   [place]wan: PAP: (38-48)/(16-23) 44/18 CVP:  [1 mmHg-22 mmHg] 15 mmHg CO:  [4.5 L/min] 4.5 L/min CI:  [2.4 L/min/m2] 2.4 L/min/m2   Having a hard time sleeping. Denies SOB    Objective:   Weight Range: 88 kg Body mass index is 31.31 kg/m.   Vital Signs:   Temp:  [97.6 F (36.4 C)-99.3 F (37.4 C)] 97.6 F (36.4 C) (09/15 1650) Pulse Rate:  [61-106] 99 (09/16 0600) Resp:  [7-23] 9 (09/16 0600) BP: (90-118)/(73-88) 118/81 (09/16 0600) SpO2:  [90 %-98 %] 93 % (09/16 0600) Arterial Line BP: (86-139)/(48-68) 110/61 (09/16 0600) Last BM Date: 11/13/19  Weight change: Filed Weights   11/11/19 0447 11/12/19 0400 11/13/19 0615  Weight: 78 kg 86.7 kg 88 kg    Intake/Output:   Intake/Output Summary (Last 24 hours) at 11/14/2019 0811 Last data filed at 11/14/2019 0600 Gross per 24 hour  Intake 1613.91 ml  Output 2615 ml  Net -1001.09 ml      Physical Exam   General:  Appears pale. No resp difficulty HEENT: normal Neck: supple. To jaw . Carotids 2+ bilat; no bruits. No lymphadenopathy or thryomegaly appreciated. Cor: PMI nondisplaced. Tachy Irregular rate & rhythm. No rubs, gallops or murmurs.Direct impella Lungs: clear on 4 liters Payne Abdomen: soft, nontender, nondistended. No hepatosplenomegaly. No bruits or masses. Good bowel sounds. Extremities: no cyanosis, clubbing, rash, edema Neuro: alert & orientedx3,  cranial nerves grossly intact. moves all 4 extremities w/o difficulty. Affect pleasant     Telemetry    A fib 130-140s   Labs    CBC Recent Labs    11/13/19 1512 11/14/19 0318  WBC 10.6* 10.3  HGB 8.7* 7.6*  HCT 26.5* 23.7*  MCV 96.0 95.2  PLT 128* 010*   Basic Metabolic Panel Recent Labs    11/13/19 1512 11/14/19 0318  NA 132* 134*  K 3.9 3.8  CL 96* 99  CO2 25 26  GLUCOSE 122* 112*  BUN 19 22  CREATININE 0.96 1.07  CALCIUM 8.0* 7.8*  MG 2.2 2.1   Liver Function Tests Recent Labs    11/14/19 0318  AST 29  ALT 20  ALKPHOS 41  BILITOT 0.9  PROT 5.5*  ALBUMIN 3.1*   No results for input(s): LIPASE, AMYLASE in the last 72 hours. Cardiac Enzymes No results for input(s): CKTOTAL, CKMB, CKMBINDEX, TROPONINI in the last 72 hours.  BNP: BNP (last 3 results) Recent Labs    11/05/19 2228  BNP 468.7*    ProBNP (last 3 results) No results for input(s): PROBNP in the last 8760 hours.   D-Dimer No results for input(s): DDIMER in the last 72 hours. Hemoglobin A1C No results for input(s): HGBA1C in the last 72 hours. Fasting Lipid Panel No results for input(s): CHOL, HDL, LDLCALC, TRIG, CHOLHDL, LDLDIRECT in the last 72 hours. Thyroid  Function Tests No results for input(s): TSH, T4TOTAL, T3FREE, THYROIDAB in the last 72 hours.  Invalid input(s): FREET3  Other results:   Imaging    DG Chest Port 1 View  Result Date: 11/14/2019 CLINICAL DATA:  Bypass surgery. EXAM: PORTABLE CHEST 1 VIEW COMPARISON:  11/13/2019 FINDINGS: The right-sided Swan-Ganz catheter has been removed. The right IJ Cordis is still in place. Right PICC line is stable. Impella device is stable. Bilateral chest tubes in good position, unchanged. No pneumothoraces. Stable cardiac enlargement and prominent mediastinal and hilar contours. Improving lung aeration with resolving edema, atelectasis and left effusion. IMPRESSION: Improving lung aeration with resolving edema, atelectasis and  left effusion. Electronically Signed   By: Marijo Sanes M.D.   On: 11/14/2019 07:38     Medications:     Scheduled Medications: . acetaminophen  1,000 mg Oral Q6H   Or  . acetaminophen (TYLENOL) oral liquid 160 mg/5 mL  1,000 mg Per Tube Q6H  . aspirin EC  325 mg Oral Daily   Or  . aspirin  324 mg Per Tube Daily  . bisacodyl  10 mg Oral Daily   Or  . bisacodyl  10 mg Rectal Daily  . docusate sodium  200 mg Oral Daily  . furosemide  80 mg Intravenous BID  . insulin aspart  0-15 Units Subcutaneous Q4H  . mouth rinse  15 mL Mouth Rinse BID  . mupirocin ointment  1 application Nasal BID  . pantoprazole  40 mg Oral Daily  . sodium chloride flush  10-40 mL Intracatheter Q12H  . sodium chloride flush  3 mL Intravenous Q12H  . spironolactone  12.5 mg Oral Daily    Infusions: . sodium chloride Stopped (11/13/19 0924)  . sodium chloride    . sodium chloride 10 mL/hr at 11/11/19 2047  . amiodarone 30 mg/hr (11/14/19 0801)  . dexmedetomidine (PRECEDEX) IV infusion Stopped (11/12/19 0848)  . impella catheter heparin 50 unit/mL in dextrose 5%    . heparin 500 Units/hr (11/14/19 0807)  . lactated ringers    . lactated ringers 20 mL/hr at 11/14/19 0500  . milrinone 0.25 mcg/kg/min (11/14/19 0500)  . norepinephrine (LEVOPHED) Adult infusion 3 mcg/min (11/14/19 0500)  . potassium chloride      PRN Medications: sodium chloride, LORazepam, metoprolol tartrate, midazolam, morphine injection, ondansetron (ZOFRAN) IV, oxyCODONE, pneumococcal 23 valent vaccine, sodium chloride flush, sodium chloride flush, traMADol    Assessment/Plan   1. 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  Direct Imepella - P5 Flow 2.8.  Remains on lnorepi 3 mcg +  milrinone 0.25 mcg. On Heparin drip.  CO-OX ok. CVP 14-15. Continue IV lasix 80 mg twice a day.   - On aspirin - Hold bb for now.   2. CAD: H/o PCI to LCX in 2017. LHC 11/05/19 with multivessel CAD,  including calcified 80% distal LMCA stenosis, small diffusely diseased distal LAD, chronically occluded OM1, and 50% mid RCA lesion. - Intolerance of statins, he is on Praluent at home.   3. . Acute systolic CHF: Ischemic cardiomyopathy.  Echo with EF 25-30%.   - If EF remains low after CABG, will be CRT-D candidate with LBBB.  - No bb for now.  - CVP remains elevated. Continue lasix x to 80 mg twice a day  - Continue 12.5 mg spiro daily.   4. AKI:  Renal function ok.   5. PVCs: Frequent.   - Now on amiodarone. Started on  IV amio 9/14 for increased ectopy.  - Continue for now.   6. LBBB  7. Anemia, expected blood loss Hgb down to 7.6.  Type and screen.  8. A fib RVR On amio drip. Give 150 bolus now and increase amio drip to 60 mg per hour.  On heparin drip.     Length of Stay: [place], NP  11/14/2019, 8:11 AM  Advanced Heart Failure Team Pager [phone] (M-F; 7a - 4p)  Please contact [facility] for night-coverage after hours (4p -7a ) and weekends on [email or web address]  Patient seen with NP, agree with the above note.   Diuresed yesterday, about 1 L net negative.  CVP 13-14.  Co-ox 63% with CI 2.4.  Overnight, he went into atrial fibrillation with RVR.  Amiodarone bolused and increased to 60 mg/hr.  He is now on milrinone 0.25, NE 3.  Impella at P5, functioning appropriately with no alarms.   He has gone back into NSR in 60s.    General: NAD Neck: JVP 12-14, no thyromegaly or thyroid nodule.  Lungs: Decreased at bases.  CV: Nondisplaced PMI.  Heart regular S1/S2, no S3/S4, Impella sounds.  No peripheral edema.   Abdomen: Soft, nontender, no hepatosplenomegaly, no distention.  Skin: Intact without lesions or rashes.  Neurologic: Alert and oriented x 3.  Psych: Normal affect. Extremities: No clubbing or cyanosis.  HEENT: Normal.   Now back in NSR, continue amiodarone gtt.  He is on heparin gtt.   Good cardiac output this morning, CVP remains elevated.   - To  decrease drive for atrial fibrillation, I will decrease milrinone to 0.125.  - I suspect we can wean NE as well, BP stable.  - Decrease Impella to P4.  LDH stable and on heparin gtt.  Will check Impella position by echo today.  Hopefully, remove Impella tomorrow if he remains stable.  - Needs ongoing diuresis, Lasix 80 mg IV bid.  Creatinine stable. - Continue spironolactone 12.5 daily.  - Add digoxin 0.125 daily.   Out of bed to chair.   Hgb 7.6, to get 1 unit PRBCs.   CRITICAL CARE Performed by: Loralie Champagne  Total critical care time: 35 minutes  Critical care time was exclusive of separately billable procedures and treating other patients.  Critical care was necessary to treat or prevent imminent or life-threatening deterioration.  Critical care was time spent personally by me on the following activities: development of treatment plan with patient and/or surrogate as well as nursing, discussions with consultants, evaluation of patient's response to treatment, examination of patient, obtaining history from patient or surrogate, ordering and performing treatments and interventions, ordering and review of laboratory studies, ordering and review of radiographic studies, pulse oximetry and re-evaluation of patient's condition.  Loralie Champagne 11/14/2019 9:21 AM

## 2019-11-14 NOTE — Progress Notes (Addendum)
3 Days Post-Op Procedure(s) (LRB): CORONARY ARTERY BYPASS GRAFTING (CABG) USING LIMA to Diag1; ENDOSCOPICALLY HARVESTED RIGHT GREATER SAPHENOUS VEIN: SVG to OM1; SVG to OM2; SVG to PDA. (N/A) PLACEMENT OF IMPELLA LEFT VENTRICULAR ASSIST DEVICE 5.5 (N/A) TRANSESOPHAGEAL ECHOCARDIOGRAM (TEE) (N/A) ENDOVEIN HARVEST OF GREATER SAPHENOUS VEIN (Right) Subective Back in nsr on iv amio Impella weaned to P4 Blood loss anemia - Hb 47m4, will give 1 U PRBC Plan removal of Impella Direct in OR tomorrow am- procedure d/w patient and wife    Objective: Vital signs in last 24 hours: Temp:  [97.6 F (36.4 C)-99.1 F (37.3 C)] 97.6 F (36.4 C) (09/15 1650) Pulse Rate:  [61-106] 99 (09/16 0800) Cardiac Rhythm: Atrial fibrillation (09/16 0800) Resp:  [7-23] 13 (09/16 0800) BP: (90-118)/(73-96) 118/96 (09/16 0800) SpO2:  [90 %-98 %] 94 % (09/16 0800) Arterial Line BP: (86-139)/(48-68) 120/65 (09/16 0800)  Hemodynamic parameters for last 24 hours: PAP: (38-44)/(16-18) 44/18 CVP:  [1 mmHg-22 mmHg] 15 mmHg  Intake/Output from previous day: 09/15 0701 - 09/16 0700 In: 1692.8 [I.V.:1138.3; IV Piggyback:305.3] Out: 2715 [Urine:2555; Chest Tube:160] Intake/Output this shift: No intake/output data recorded.       Exam    General- alert and comfortable    Neck- no JVD, no cervical adenopathy palpable, no carotid bruit   Lungs- clear without rales, wheezes   Cor- regular rate and rhythm, no murmur , gallop   Abdomen- soft, non-tender   Extremities - warm, non-tender, minimal edema   Neuro- oriented, appropriate, no focal weakness   Lab Results: Recent Labs    11/13/19 1512 11/14/19 0318  WBC 10.6* 10.3  HGB 8.7* 7.6*  HCT 26.5* 23.7*  PLT 128* 122*   BMET:  Recent Labs    11/13/19 1512 11/14/19 0318  NA 132* 134*  K 3.9 3.8  CL 96* 99  CO2 25 26  GLUCOSE 122* 112*  BUN 19 22  CREATININE 0.96 1.07  CALCIUM 8.0* 7.8*    PT/INR:  Recent Labs    11/11/19 1630  LABPROT 16.2*   INR 1.4*   ABG    Component Value Date/Time   PHART 7.403 11/12/2019 1039   HCO3 25.0 11/12/2019 1039   TCO2 26 11/12/2019 1039   ACIDBASEDEF 4.0 (H) 11/11/2019 2301   O2SAT 63.1 11/14/2019 0318   CBG (last 3)  Recent Labs    11/13/19 2359 11/14/19 0316 11/14/19 0754  GLUCAP 115* 114* 89    Assessment/Plan: S/P Procedure(s) (LRB): CORONARY ARTERY BYPASS GRAFTING (CABG) USING LIMA to Diag1; ENDOSCOPICALLY HARVESTED RIGHT GREATER SAPHENOUS VEIN: SVG to OM1; SVG to OM2; SVG to PDA. (N/A) PLACEMENT OF IMPELLA LEFT VENTRICULAR ASSIST DEVICE 5.5 (N/A) TRANSESOPHAGEAL ECHOCARDIOGRAM (TEE) (N/A) ENDOVEIN HARVEST OF GREATER SAPHENOUS VEIN (Right) Return to OR in am to rermove Impella 5.5 Direct A-V pace with LV epicardial wire due to severe LBBB  LOS: 9 days    [name] 11/14/2019

## 2019-11-14 NOTE — Progress Notes (Signed)
[place] for Heparin Indication: Impella 5.5  Allergies  Allergen Reactions  . Brilinta [Ticagrelor] Shortness Of Breath  . Chlorhexidine Gluconate Other (See Comments)    Skin burning for hours afterward  . Statins Other (See Comments)    Failed Crestor 5 mg twice weekly, Crestor 20 mg daily, Pravastatin 40 mg qd, Lipitor, Zocor - muscle aches  . Zetia [Ezetimibe] Other (See Comments)    Muscle aches    Patient Measurements: Height: 5\' 6"  (167.6 cm) Weight: 88 kg (194 lb 0.1 oz) IBW/kg (Calculated) : 63.8 Heparin Dosing Weight: 79.7 kg  Vital Signs: Temp: 100.6 F (38.1 C) (09/16 1415) Temp Source: Core (09/16 1415) BP: 101/79 (09/16 1900) Pulse Rate: 107 (09/16 1900)  Labs: Recent Labs    11/12/19 0400 11/12/19 0411 11/13/19 0304 11/13/19 0304 11/13/19 1512 11/13/19 1512 11/14/19 0318 11/14/19 0427 11/14/19 1746 11/14/19 1821  HGB 8.1*   < > 8.4*   < > 8.7*   < > 7.6*  --   --  9.2*  HCT 24.3*   < > 25.8*   < > 26.5*  --  23.7*  --   --  27.0*  PLT 174   < > 116*  --  128*  --  122*  --   --   --   APTT 41*  --   --   --   --   --   --   --   --   --   HEPARINUNFRC <0.10*   < > <0.10*   < > <0.10*   < > 0.79* <0.10* <0.10*  --   CREATININE 1.20   < > 1.00  --  0.96  --  1.07  --   --   --    < > = values in this interval not displayed.    Estimated Creatinine Clearance: 67.7 mL/min (by C-G formula based on SCr of 1.07 mg/dL).  Assessment: 69 yo M w/ multivessel CAD now s/p CABG x4 with placement of Impella 5.5 on 9/13. Pharmacy asked to monitor anticoagulation with Impella.  Impella weaned down to P4 this morning with heparin running through purge. Purge flow currently 10.8 ml/hr (540 units/hr). Plans noted for removal of Impella tomorrow.  Heparin level remains undetectable - no adjustments per Dr Darcey Nora  Goal of Therapy:  Heparin level 0.2-0.3 units/ml  Monitor platelets by anticoagulation protocol: Yes    Plan:  Continue heparin through purge solution Continue systemic heparin to 600 units/hr Daily hep lvl cbc  Barth Kirks, PharmD, BCPS, BCCCP Clinical Pharmacist [phone]  Please check AMION for all [place] numbers  11/14/2019 7:09 PM  .

## 2019-11-14 NOTE — Progress Notes (Signed)
TCTS Evening Rounds  Stable night BP 101/79   Pulse 62   Temp 99.9 F (37.7 C)   Resp 13   Ht 5\' 6"  (1.676 m)   Wt 88 kg   SpO2 97%   BMI 31.31 kg/m    Intake/Output Summary (Last 24 hours) at 11/14/2019 2323 Last data filed at 11/14/2019 2126 Gross per 24 hour  Intake 2493.93 ml  Output 3365 ml  Net -871.07 ml    Resting comfortably No complaints  A/P: continue present management plans. Kshawn Canal Z. Orvan Seen, [place]

## 2019-11-14 NOTE — Progress Notes (Signed)
[place] for Heparin Indication: Impella 5.5  Allergies  Allergen Reactions  . Brilinta [Ticagrelor] Shortness Of Breath  . Chlorhexidine Gluconate Other (See Comments)    Skin burning for hours afterward  . Zetia [Ezetimibe] Other (See Comments)    Muscle aches  . Statins Other (See Comments)    Failed Crestor 5 mg twice weekly, Crestor 20 mg daily, Pravastatin 40 mg qd, Lipitor, Zocor - muscle aches    Patient Measurements: Height: 5\' 6"  (167.6 cm) Weight: 88 kg (194 lb 0.1 oz) IBW/kg (Calculated) : 63.8 Heparin Dosing Weight: 79.7 kg  Vital Signs: BP: 118/96 (09/16 0800) Pulse Rate: 99 (09/16 0800)  Labs: Recent Labs    11/11/19 1630 11/11/19 2300 11/12/19 0400 11/12/19 0411 11/13/19 0304 11/13/19 0304 11/13/19 1512 11/14/19 0318 11/14/19 0427  HGB 9.6*   < > 8.1*   < > 8.4*   < > 8.7* 7.6*  --   HCT 29.2*   < > 24.3*   < > 25.8*  --  26.5* 23.7*  --   PLT 160   < > 174   < > 116*  --  128* 122*  --   APTT 28  --  41*  --   --   --   --   --   --   LABPROT 16.2*  --   --   --   --   --   --   --   --   INR 1.4*  --   --   --   --   --   --   --   --   HEPARINUNFRC  --   --  <0.10*   < > <0.10*   < > <0.10* 0.79* <0.10*  CREATININE  --    < > 1.20   < > 1.00  --  0.96 1.07  --    < > = values in this interval not displayed.    Estimated Creatinine Clearance: 67.7 mL/min (by C-G formula based on SCr of 1.07 mg/dL).   Medical History: Past Medical History:  Diagnosis Date  . Abnormal nuclear cardiac imaging test 08/08/2015  . Arthritis    fingers  . Carotid artery occlusion   . Coronary atherosclerosis of native coronary artery 01/29/2013   11/05/19 R/LHC 80% dLMCA stenosis small diffusely dz dLAD, chronically occluded OM1 50%mRCA lesion, widely patent mLCx strent, moderately elevated L heart filling pressures, mild to moderate RH filling pressures, normal to moderately reduced CO  . Duodenal erosion   . Encounter for  screening for lung cancer 07/13/2016  . Esophageal stenosis    esophageal dilation  . GERD (gastroesophageal reflux disease)   . H. pylori infection   . Heart attack [facility]) Oct. 2009   Mild  . Hiatal hernia   . Hyperlipidemia   . Hypertension   . 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 Cardiac catheterization-nonobstructive 60% RCA disease-subsequent nuclear stress test-9 minutes, low risk, mild inferior wall hypokinesis   . Pain in limb 12/19/2017  . Peripheral vascular disease ([place])   . Unstable angina ([place]) 11/25/2017    Assessment: 69 yo M w/ multivessel CAD now s/p CABG x4 with placement of Impella 5.5 on 9/13. Pharmacy asked to monitor anticoagulation with Impella.  Impella weaned down to P4 this morning with heparin running through purge. Purge flow currently 10.8 ml/hr (540 units/hr). Plans noted for removal of Impella tomorrow.  Heparin level remains undetectable. Given oozing at Impella site, systemic heparin was continued at 500 units/hr this morning. Hgb down to 7.6 today - receiving 2u PRBC. Plts stable at 122. LDH down to 386.  Discussed with Dr. Prescott Gum and will increase systemic heparin to 600 units/hr with no further increases until reassessed tomorrow morning.  Goal of Therapy:  Heparin level 0.2-0.3 units/ml  Monitor platelets by anticoagulation protocol: Yes   Plan:  Continue heparin through purge solution Increase systemic heparin to 600 units/hr Check 6-hr HL to ensure not supratherpeutic  Monitor daily LDH and CBC  Richardine Service, PharmD PGY2 Cardiology Pharmacy Resident Phone: [phone] 11/14/2019  10:06 AM  Please check [email or web address] for unit-specific pharmacy phone numbers.

## 2019-11-14 NOTE — Progress Notes (Signed)
ANTICOAGULATION CONSULT NOTE - Follow Up Consult  Pharmacy Consult for heparin Indication: Impella  Labs: Recent Labs    11/11/19 1630 11/11/19 2300 11/12/19 0400 11/12/19 0411 11/13/19 0304 11/13/19 0304 11/13/19 1512 11/14/19 0318 11/14/19 0427  HGB 9.6*   < > 8.1*   < > 8.4*   < > 8.7* 7.6*  --   HCT 29.2*   < > 24.3*   < > 25.8*  --  26.5* 23.7*  --   PLT 160   < > 174   < > 116*  --  128* 122*  --   APTT 28  --  41*  --   --   --   --   --   --   LABPROT 16.2*  --   --   --   --   --   --   --   --   INR 1.4*  --   --   --   --   --   --   --   --   HEPARINUNFRC  --   --  <0.10*   < > <0.10*   < > <0.10* 0.79* <0.10*  CREATININE  --    < > 1.20   < > 1.00  --  0.96 1.07  --    < > = values in this interval not displayed.    Assessment: 69yo male subtherapeutic on heparin after conservative rate changes; no gtt issues or signs of bleeding per RN other than some oozing around Impella site.  Purge solution providing 540-580 units/hr overnight.  Goal of Therapy:  Heparin level 0.2-0.3 units/ml   Plan:  Will increase systemic heparin gtt slightly to 600 units/hr and check level in 8 hours.    Wynona Neat, PharmD, BCPS  11/14/2019,5:51 AM

## 2019-11-14 NOTE — Progress Notes (Signed)
Echocardiogram 2D Echocardiogram has been performed.  Oneal Deputy Jacoba Cherney 11/14/2019, 10:16 AM   Impella rep present for exam.

## 2019-11-14 NOTE — Progress Notes (Signed)
Physical Therapy Treatment Patient Details Name: [name] MRN: [id number] DOB: December 12, 1950 Today's Date: 11/14/2019    History of Present Illness Pt admitted after cath with CAD and ICM. Pt s/p high risk CABG with impella 5.5 placement on 9/13. Plan to possibly remove Impella tomorrow 9/17. PMhx: CAD, NSTEMI, LBBB, NICM, PVD s/p mechanical thrombectomy, HTN, HLD, hiatal hernia    PT Comments    Pt making good progress today.  He was able to stand and take a few small steps to a chair with HHA of 2 for safety.  PT, OT , and RN in for treatment due to lines/leads, precautions, and for assist with transfers.  Pt required cues for sternal precautions.  Pt is hopeful for removal of impella tomorrow and may be able to further progress therapy.      Follow Up Recommendations  Supervision/Assistance - 24 hour;CIR     Equipment Recommendations  Rolling walker with 5" wheels;3in1 (PT)    Recommendations for Other Services       Precautions / Restrictions Precautions Precautions: Sternal;Fall Precaution Booklet Issued: No Precaution Comments: impella, chest tube, external pacer    Mobility  Bed Mobility Overal bed mobility: Needs Assistance Bed Mobility: Supine to Sit     Supine to sit: Mod assist     General bed mobility comments: assist to raise trunk and position hips at EOB with bed pad  Transfers Overall transfer level: Needs assistance Equipment used: 2 person hand held assist Transfers: Sit to/from Stand;Stand Pivot Transfers Sit to Stand: Min assist;+2 safety/equipment Stand pivot transfers: +2 safety/equipment;Min assist       General transfer comment: cues for technique, assist to rise and steady holding heart pillow; performed x 2  Ambulation/Gait Ambulation/Gait assistance: Min assist;+2 safety/equipment Gait Distance (Feet): 2 Feet Assistive device: 2 person hand held assist Gait Pattern/deviations: Decreased stride length;Shuffle Gait velocity:  decreased   General Gait Details: small steps to chair   Stairs             Wheelchair Mobility    Modified Rankin (Stroke Patients Only)       Balance Overall balance assessment: Needs assistance Sitting-balance support: No upper extremity supported;Feet supported Sitting balance-Leahy Scale: Fair     Standing balance support: Bilateral upper extremity supported Standing balance-Leahy Scale: Poor Standing balance comment: bil HHA but steady                            Cognition Arousal/Alertness: Awake/alert Behavior During Therapy: Flat affect Overall Cognitive Status: Impaired/Different from baseline Area of Impairment: Memory                     Memory: Decreased recall of precautions         General Comments: needing repeated reminders to generalize sternal precautions      Exercises      General Comments General comments (skin integrity, edema, etc.): OT and RN present to assist with line/lead and transfers; All VSS on 3 LPM O2.      Pertinent Vitals/Pain Pain Assessment: Faces Faces Pain Scale: Hurts little more Pain Location: chest Pain Descriptors / Indicators: Sore Pain Intervention(s): Monitored during session;Repositioned    Home Living Family/patient expects to be discharged to:: Private residence Living Arrangements: Spouse/significant other Available Help at Discharge: Available 24 hours/day;Friend(s) Type of Home: House Home Access: Stairs to enter Entrance Stairs-Rails: Right;Left Home Layout: One level Home Equipment: None  Prior Function Level of Independence: Independent      Comments: enjoys golf, dancing   PT Goals (current goals can now be found in the care plan section) Acute Rehab PT Goals Patient Stated Goal: return to golf, dancing PT Goal Formulation: With patient/family Time For Goal Achievement: 11/27/19 Potential to Achieve Goals: Good Progress towards PT goals: Progressing toward  goals    Frequency    Min 3X/week      PT Plan Current plan remains appropriate    Co-evaluation              AM-PAC PT "6 Clicks" Mobility   Outcome Measure  Help needed turning from your back to your side while in a flat bed without using bedrails?: A Lot Help needed moving from lying on your back to sitting on the side of a flat bed without using bedrails?: A Lot Help needed moving to and from a bed to a chair (including a wheelchair)?: A Lot Help needed standing up from a chair using your arms (e.g., wheelchair or bedside chair)?: A Little Help needed to walk in hospital room?: A Lot Help needed climbing 3-5 steps with a railing? : Total 6 Click Score: 12    End of Session Equipment Utilized During Treatment: Oxygen Activity Tolerance: Patient tolerated treatment well Patient left: in chair;with call bell/phone within reach;with family/visitor present;with nursing/sitter in room Nurse Communication: Mobility status PT Visit Diagnosis: Other abnormalities of gait and mobility (R26.89);Difficulty in walking, not elsewhere classified (R26.2);Muscle weakness (generalized) (M62.81)     Time: 2878-6767 PT Time Calculation (min) (ACUTE ONLY): 26 min  Charges:  $Therapeutic Activity: 8-22 mins                     Abran Richard, PT Acute Rehab Services Pager [phone] [facility] 11/14/2019, 3:27 PM

## 2019-11-14 NOTE — Evaluation (Signed)
Occupational Therapy Evaluation Patient Details Name: [name] MRN: [id number] DOB: [birth year] Today's Date: 11/14/2019    History of Present Illness Pt admitted after cath with CAD and ICM. Pt s/p high risk CABG with impella 5.5 placement on 9/13. PMhx: CAD, NSTEMI, LBBB, NICM, PVD s/p mechanical thrombectomy, HTN, HLD, hiatal hernia   Clinical Impression   Pt was independent and active prior to admission. Presents with generalized weakness, decreased activity tolerance, difficulty generalizing sternal precautions and mild standing balance deficits. Pt requires min assist +2 for OOB and set up to total assist for ADL. Limited evaluation due to Impella. Pt has good support at home. Recommending CIR for intensive rehab prior to return home. Will follow acutely.    Follow Up Recommendations  CIR    Equipment Recommendations  3 in 1 bedside commode    Recommendations for Other Services       Precautions / Restrictions Precautions Precautions: Sternal;Fall Precaution Booklet Issued: No Precaution Comments: impella, chest tube, external pacer      Mobility Bed Mobility Overal bed mobility: Needs Assistance Bed Mobility: Supine to Sit     Supine to sit: Mod assist     General bed mobility comments: assist to raise trunk and position hips at EOB with bed pad  Transfers Overall transfer level: Needs assistance Equipment used: 2 person hand held assist Transfers: Sit to/from Stand;Stand Pivot Transfers Sit to Stand: Min assist;+2 safety/equipment Stand pivot transfers: +2 safety/equipment;Min assist       General transfer comment: cues for technique, assist to rise and steady holding heart pillow    Balance Overall balance assessment: Mild deficits observed, not formally tested                                         ADL either performed or assessed with clinical judgement   ADL Overall ADL's : Needs assistance/impaired Eating/Feeding: Set  up;Sitting   Grooming: Minimal assistance;Sitting   Upper Body Bathing: Moderate assistance;Sitting   Lower Body Bathing: Total assistance;Sit to/from stand   Upper Body Dressing : Moderate assistance;Sitting   Lower Body Dressing: Total assistance;Bed level   Toilet Transfer: Minimal assistance;Stand-pivot;+2 for safety/equipment   Toileting- Clothing Manipulation and Hygiene: Total assistance;Sit to/from stand         General ADL Comments: Limited mobility due to [place] Patient Visual Report: No change from baseline       Perception     Praxis      Pertinent Vitals/Pain Pain Assessment: Faces Faces Pain Scale: Hurts little more Pain Location: chest Pain Descriptors / Indicators: Sore Pain Intervention(s): Monitored during session;Repositioned     Hand Dominance Right   Extremity/Trunk Assessment Upper Extremity Assessment Upper Extremity Assessment: Generalized weakness (not formally assessed)   Lower Extremity Assessment Lower Extremity Assessment: Defer to PT evaluation   Cervical / Trunk Assessment Cervical / Trunk Assessment: Normal   Communication Communication Communication: No difficulties   Cognition Arousal/Alertness: Awake/alert Behavior During Therapy: Flat affect   Area of Impairment: Memory                     Memory: Decreased recall of precautions         General Comments: needing repeated reminders to generalize sternal precautions   General Comments       Exercises     Shoulder Instructions  Home Living Family/patient expects to be discharged to:: Private residence Living Arrangements: Spouse/significant other Available Help at Discharge: Available 24 hours/day;Friend(s) Type of Home: House Home Access: Stairs to enter [employer] of Steps: 3 Entrance Stairs-Rails: Right;Left Home Layout: One level     Bathroom Shower/Tub: Occupational psychologist: Handicapped height      [place]: None          Prior Functioning/Environment Level of Independence: Independent        Comments: enjoys golf, dancing        OT Problem List: Impaired balance (sitting and/or standing);Decreased activity tolerance;Decreased strength;Decreased knowledge of precautions;Decreased knowledge of use of DME or AE;Obesity;Pain      OT Treatment/Interventions: Self-care/ADL training;Energy conservation;DME and/or AE instruction;Therapeutic activities;Patient/family education;Balance training    OT Goals(Current goals can be found in the care plan section) Acute Rehab OT Goals Patient Stated Goal: return to golf, dancing OT Goal Formulation: With patient Time For Goal Achievement: 11/28/19 Potential to Achieve Goals: Good ADL Goals Pt Will Perform Grooming: with supervision;standing Pt Will Perform Upper Body Dressing: with set-up;sitting Pt Will Perform Lower Body Dressing: with supervision;sit to/from stand Pt Will Transfer to Toilet: with supervision;ambulating;bedside commode Pt Will Perform Toileting - Clothing Manipulation and hygiene: with supervision;sit to/from stand Additional ADL Goal #1: Pt will generalize sternal precautions. Additional ADL Goal #2: Pt will perform bed mobility with supervision in preparation for ADL.  OT Frequency: Min 2X/week   Barriers to D/C:            Co-evaluation              AM-PAC OT "6 Clicks" Daily Activity     Outcome Measure Help from another person eating meals?: A Little Help from another person taking care of personal grooming?: A Little Help from another person toileting, which includes using toliet, bedpan, or urinal?: Total Help from another person bathing (including washing, rinsing, drying)?: A Lot Help from another person to put on and taking off regular upper body clothing?: A Lot Help from another person to put on and taking off regular lower body clothing?: Total 6 Click Score: 12   End of  Session Nurse Communication: Mobility status (RN managed Impella)  Activity Tolerance: Patient tolerated treatment well;Treatment limited secondary to medical complications (Comment) (Impella) Patient left: in chair;with call bell/phone within reach;with family/visitor present;with nursing/sitter in room  OT Visit Diagnosis: Unsteadiness on feet (R26.81);Pain;Other symptoms and signs involving cognitive function;Muscle weakness (generalized) (M62.81)                Time: 5364-6803 OT Time Calculation (min): 27 min Charges:  OT General Charges $OT Visit: 1 Visit OT Evaluation $OT Eval High Complexity: 1 High  Nestor Lewandowsky, OTR/L Acute Rehabilitation Services Pager: [phone] Office: [phone]  [name] 11/14/2019, 3:06 PM

## 2019-11-15 ENCOUNTER — Inpatient Hospital Stay (HOSPITAL_COMMUNITY): Payer: PPO | Admitting: Certified Registered"

## 2019-11-15 ENCOUNTER — Encounter (HOSPITAL_COMMUNITY)
Admission: AD | Disposition: A | Discharge status: Home / Self Care | Payer: Self-pay | Source: Other Acute Inpatient Hospital | Attending: Cardiothoracic Surgery

## 2019-11-15 ENCOUNTER — Inpatient Hospital Stay (HOSPITAL_COMMUNITY): Payer: PPO

## 2019-11-15 DIAGNOSIS — I255 Ischemic cardiomyopathy: Secondary | ICD-10-CM

## 2019-11-15 HISTORY — PX: REMOVAL OF IMPELLA LEFT VENTRICULAR ASSIST DEVICE: SHX6556

## 2019-11-15 HISTORY — PX: TEE WITHOUT CARDIOVERSION: SHX5443

## 2019-11-15 LAB — COMPREHENSIVE METABOLIC PANEL
ALT: 20 U/L (ref 0–44)
AST: 23 U/L (ref 15–41)
Albumin: 2.9 g/dL — ABNORMAL LOW (ref 3.5–5.0)
Alkaline Phosphatase: 51 U/L (ref 38–126)
Anion gap: 7 (ref 5–15)
BUN: 22 mg/dL (ref 8–23)
CO2: 29 mmol/L (ref 22–32)
Calcium: 8 mg/dL — ABNORMAL LOW (ref 8.9–10.3)
Chloride: 97 mmol/L — ABNORMAL LOW (ref 98–111)
Creatinine, Ser: 0.96 mg/dL (ref 0.61–1.24)
GFR calc Af Amer: >60 mL/min (ref >60)
GFR calc non Af Amer: >60 mL/min (ref >60)
Glucose, Bld: 123 mg/dL — ABNORMAL HIGH (ref 70–99)
Potassium: 3.9 mmol/L (ref 3.5–5.1)
Sodium: 133 mmol/L — ABNORMAL LOW (ref 135–145)
Total Bilirubin: 0.7 mg/dL (ref 0.3–1.2)
Total Protein: 5.9 g/dL — ABNORMAL LOW (ref 6.5–8.1)

## 2019-11-15 LAB — POCT I-STAT 7, (LYTES, BLD GAS, ICA,H+H)
Acid-Base Excess: 5 mmol/L — ABNORMAL HIGH (ref 0.0–2.0)
Acid-Base Excess: 5 mmol/L — ABNORMAL HIGH (ref 0.0–2.0)
Bicarbonate: 29.9 mmol/L — ABNORMAL HIGH (ref 20.0–28.0)
Bicarbonate: 30 mmol/L — ABNORMAL HIGH (ref 20.0–28.0)
Calcium, Ion: 1.12 mmol/L — ABNORMAL LOW (ref 1.15–1.40)
Calcium, Ion: 1.12 mmol/L — ABNORMAL LOW (ref 1.15–1.40)
HCT: 25 % — ABNORMAL LOW (ref 39.0–52.0)
HCT: 24 % — ABNORMAL LOW (ref 39.0–52.0)
Hemoglobin: 8.5 g/dL — ABNORMAL LOW (ref 13.0–17.0)
Hemoglobin: 8.2 g/dL — ABNORMAL LOW (ref 13.0–17.0)
O2 Saturation: 95 %
O2 Saturation: 96 %
Patient temperature: 37
Potassium: 3.8 mmol/L (ref 3.5–5.1)
Potassium: 3.9 mmol/L (ref 3.5–5.1)
Sodium: 133 mmol/L — ABNORMAL LOW (ref 135–145)
Sodium: 134 mmol/L — ABNORMAL LOW (ref 135–145)
TCO2: 31 mmol/L (ref 22–32)
TCO2: 31 mmol/L (ref 22–32)
pCO2 arterial: 46.8 mmHg (ref 32.0–48.0)
pCO2 arterial: 47.5 mmHg (ref 32.0–48.0)
pH, Arterial: 7.414 (ref 7.350–7.450)
pH, Arterial: 7.408 (ref 7.350–7.450)
pO2, Arterial: 78 mmHg — ABNORMAL LOW (ref 83.0–108.0)
pO2, Arterial: 80 mmHg — ABNORMAL LOW (ref 83.0–108.0)

## 2019-11-15 LAB — GLUCOSE, CAPILLARY
Glucose-Capillary: 117 mg/dL — ABNORMAL HIGH (ref 70–99)
Glucose-Capillary: 122 mg/dL — ABNORMAL HIGH (ref 70–99)
Glucose-Capillary: 124 mg/dL — ABNORMAL HIGH (ref 70–99)
Glucose-Capillary: 140 mg/dL — ABNORMAL HIGH (ref 70–99)
Glucose-Capillary: 159 mg/dL — ABNORMAL HIGH (ref 70–99)
Glucose-Capillary: 174 mg/dL — ABNORMAL HIGH (ref 70–99)

## 2019-11-15 LAB — BASIC METABOLIC PANEL
Anion gap: 10 (ref 5–15)
BUN: 20 mg/dL (ref 8–23)
CO2: 29 mmol/L (ref 22–32)
Calcium: 8.3 mg/dL — ABNORMAL LOW (ref 8.9–10.3)
Chloride: 94 mmol/L — ABNORMAL LOW (ref 98–111)
Creatinine, Ser: 0.93 mg/dL (ref 0.61–1.24)
GFR calc Af Amer: >60 mL/min (ref >60)
GFR calc non Af Amer: >60 mL/min (ref >60)
Glucose, Bld: 213 mg/dL — ABNORMAL HIGH (ref 70–99)
Potassium: 4.1 mmol/L (ref 3.5–5.1)
Sodium: 133 mmol/L — ABNORMAL LOW (ref 135–145)

## 2019-11-15 LAB — CBC
HCT: 26.1 % — ABNORMAL LOW (ref 39.0–52.0)
HCT: 26.3 % — ABNORMAL LOW (ref 39.0–52.0)
Hemoglobin: 8.3 g/dL — ABNORMAL LOW (ref 13.0–17.0)
Hemoglobin: 8.4 g/dL — ABNORMAL LOW (ref 13.0–17.0)
MCH: 29.9 pg (ref 26.0–34.0)
MCH: 30.2 pg (ref 26.0–34.0)
MCHC: 31.8 g/dL (ref 30.0–36.0)
MCHC: 31.9 g/dL (ref 30.0–36.0)
MCV: 93.9 fL (ref 80.0–100.0)
MCV: 94.6 fL (ref 80.0–100.0)
Platelets: 130 10*3/uL — ABNORMAL LOW (ref 150–400)
Platelets: 127 10*3/uL — ABNORMAL LOW (ref 150–400)
RBC: 2.78 MIL/uL — ABNORMAL LOW (ref 4.22–5.81)
RBC: 2.78 MIL/uL — ABNORMAL LOW (ref 4.22–5.81)
RDW: 14.2 % (ref 11.5–15.5)
RDW: 14.1 % (ref 11.5–15.5)
WBC: 8.6 10*3/uL (ref 4.0–10.5)
WBC: 6.9 10*3/uL (ref 4.0–10.5)
nRBC: 0.8 % — ABNORMAL HIGH (ref 0.0–0.2)
nRBC: 0.9 % — ABNORMAL HIGH (ref 0.0–0.2)

## 2019-11-15 LAB — ECHO INTRAOPERATIVE TEE
Area-P 1/2: 5.66 cm2
Height: 66 in
MV M vel: 0.39 m/s
MV Peak grad: 0.6 mmHg
MV Vena cont: 0.34 cm
Radius: 0.5 cm
Single Plane A2C EF: 25.8 %
Single Plane A4C EF: 28.6 %
Weight: 3012.37 oz

## 2019-11-15 LAB — COOXEMETRY PANEL
Carboxyhemoglobin: 1.4 % (ref 0.5–1.5)
Carboxyhemoglobin: 1 % (ref 0.5–1.5)
Methemoglobin: 1.3 % (ref 0.0–1.5)
Methemoglobin: 1 % (ref 0.0–1.5)
O2 Saturation: 57.8 %
O2 Saturation: 59.1 %
Total hemoglobin: 8.9 g/dL — ABNORMAL LOW (ref 12.0–16.0)
Total hemoglobin: 9 g/dL — ABNORMAL LOW (ref 12.0–16.0)

## 2019-11-15 LAB — LACTATE DEHYDROGENASE: LDH: 367 U/L — ABNORMAL HIGH (ref 98–192)

## 2019-11-15 LAB — HEPARIN LEVEL (UNFRACTIONATED): Heparin Unfractionated: <0.1 IU/mL — ABNORMAL LOW (ref 0.30–0.70)

## 2019-11-15 SURGERY — REMOVAL, CARDIAC ASSIST DEVICE, IMPELLA
Anesthesia: General | Site: Esophagus

## 2019-11-15 MED ORDER — VASOPRESSIN 20 UNIT/ML IV SOLN
INTRAVENOUS | Status: AC
  Filled 2019-11-15: qty 1

## 2019-11-15 MED ORDER — FUROSEMIDE 10 MG/ML IJ SOLN
40.0000 mg | Freq: Once | INTRAMUSCULAR | Status: AC
  Administered 2019-11-15: 40 mg via INTRAVENOUS

## 2019-11-15 MED ORDER — ROCURONIUM BROMIDE 10 MG/ML (PF) SYRINGE
PREFILLED_SYRINGE | INTRAVENOUS | Status: DC | PRN
  Administered 2019-11-15: 50 mg via INTRAVENOUS

## 2019-11-15 MED ORDER — PROPOFOL 10 MG/ML IV BOLUS
INTRAVENOUS | Status: DC | PRN
  Administered 2019-11-15: 30 mg via INTRAVENOUS

## 2019-11-15 MED ORDER — SACUBITRIL-VALSARTAN 24-26 MG PO TABS
1.0000 | ORAL_TABLET | Freq: Two times a day (BID) | ORAL | Status: DC
  Administered 2019-11-15 – 2019-11-16 (×4): 1 via ORAL
  Filled 2019-11-15 (×5): qty 1

## 2019-11-15 MED ORDER — CARVEDILOL 3.125 MG PO TABS: 3.1250 mg | ORAL_TABLET | Freq: Two times a day (BID) | ORAL | Status: DC

## 2019-11-15 MED ORDER — POTASSIUM CHLORIDE CRYS ER 20 MEQ PO TBCR
40.0000 meq | EXTENDED_RELEASE_TABLET | Freq: Once | ORAL | Status: AC
  Administered 2019-11-15: 40 meq via ORAL
  Filled 2019-11-15: qty 2

## 2019-11-15 MED ORDER — LACTATED RINGERS IV SOLN: INTRAVENOUS | Status: DC | PRN

## 2019-11-15 MED ORDER — SODIUM CHLORIDE 0.9% FLUSH: 10.0000 mL | INTRAVENOUS | Status: DC | PRN

## 2019-11-15 MED ORDER — DEXAMETHASONE SODIUM PHOSPHATE 10 MG/ML IJ SOLN
INTRAMUSCULAR | Status: AC
  Filled 2019-11-15: qty 1

## 2019-11-15 MED ORDER — PROPOFOL 10 MG/ML IV BOLUS
INTRAVENOUS | Status: AC
  Filled 2019-11-15: qty 20

## 2019-11-15 MED ORDER — LISINOPRIL 10 MG PO TABS
10.0000 mg | ORAL_TABLET | Freq: Every day | ORAL | Status: DC
Start: 2019-11-15 — End: 2019-11-15

## 2019-11-15 MED ORDER — STERILE WATER FOR IRRIGATION IR SOLN
Status: DC | PRN
  Administered 2019-11-15: 1000 mL

## 2019-11-15 MED ORDER — ONDANSETRON HCL 4 MG/2ML IJ SOLN
INTRAMUSCULAR | Status: AC
  Filled 2019-11-15: qty 2

## 2019-11-15 MED ORDER — DEXAMETHASONE SODIUM PHOSPHATE 10 MG/ML IJ SOLN
INTRAMUSCULAR | Status: DC | PRN
  Administered 2019-11-15: 10 mg via INTRAVENOUS

## 2019-11-15 MED ORDER — ORAL CARE MOUTH RINSE
15.0000 mL | Freq: Two times a day (BID) | OROMUCOSAL | Status: DC
  Administered 2019-11-15 – 2019-11-20 (×7): 15 mL via OROMUCOSAL

## 2019-11-15 MED ORDER — FENTANYL CITRATE (PF) 250 MCG/5ML IJ SOLN
INTRAMUSCULAR | Status: DC | PRN
Start: 2019-11-15 — End: 2019-11-15
  Administered 2019-11-15: 50 ug via INTRAVENOUS

## 2019-11-15 MED ORDER — FUROSEMIDE 10 MG/ML IJ SOLN: 80.0000 mg | Freq: Two times a day (BID) | INTRAMUSCULAR | Status: DC

## 2019-11-15 MED ORDER — SODIUM CHLORIDE 0.9% FLUSH
10.0000 mL | Freq: Two times a day (BID) | INTRAVENOUS | Status: DC
  Administered 2019-11-15 – 2019-11-19 (×8): 10 mL

## 2019-11-15 MED ORDER — SUGAMMADEX SODIUM 200 MG/2ML IV SOLN
INTRAVENOUS | Status: DC | PRN
  Administered 2019-11-15: 200 mg via INTRAVENOUS

## 2019-11-15 MED ORDER — LIDOCAINE 2% (20 MG/ML) 5 ML SYRINGE
INTRAMUSCULAR | Status: DC | PRN
  Administered 2019-11-15: 60 mg via INTRAVENOUS

## 2019-11-15 MED ORDER — FENTANYL CITRATE (PF) 250 MCG/5ML IJ SOLN
INTRAMUSCULAR | Status: AC
  Filled 2019-11-15: qty 5

## 2019-11-15 MED ORDER — FUROSEMIDE 10 MG/ML IJ SOLN
10.0000 mg/h | INTRAVENOUS | Status: DC
  Administered 2019-11-15 – 2019-11-16 (×2): 10 mg/h via INTRAVENOUS
  Filled 2019-11-15 (×3): qty 25

## 2019-11-15 MED ORDER — 0.9 % SODIUM CHLORIDE (POUR BTL) OPTIME
TOPICAL | Status: DC | PRN
  Administered 2019-11-15: 1000 mL

## 2019-11-15 MED ORDER — INSULIN ASPART 100 UNIT/ML ~~LOC~~ SOLN
0.0000 [IU] | Freq: Three times a day (TID) | SUBCUTANEOUS | Status: DC
  Administered 2019-11-15 – 2019-11-16 (×3): 3 [IU] via SUBCUTANEOUS
  Administered 2019-11-16: 2 [IU] via SUBCUTANEOUS
  Administered 2019-11-16: 3 [IU] via SUBCUTANEOUS
  Administered 2019-11-17 – 2019-11-19 (×4): 2 [IU] via SUBCUTANEOUS

## 2019-11-15 SURGICAL SUPPLY — 50 items
ATTRACTOMAT 16X20 MAGNETIC DRP (DRAPES) ×4 IMPLANT
BAG DECANTER FOR FLEXI CONT (MISCELLANEOUS) ×4 IMPLANT
BLADE CLIPPER SURG (BLADE) ×4 IMPLANT
BLADE SURG 12 STRL SS (BLADE) ×4 IMPLANT
CANISTER SUCT 3000ML PPV (MISCELLANEOUS) ×4 IMPLANT
CLIP VESOCCLUDE MED 6/CT (CLIP) ×2 IMPLANT
CLIP VESOCCLUDE SM WIDE 6/CT (CLIP) ×2 IMPLANT
COVER SURGICAL LIGHT HANDLE (MISCELLANEOUS) ×4 IMPLANT
DRAPE CHEST BREAST 15X10 FENES (DRAPES) ×4 IMPLANT
DRAPE SLUSH/WARMER DISC (DRAPES) ×4 IMPLANT
DRSG AQUACEL AG ADV 3.5X 6 (GAUZE/BANDAGES/DRESSINGS) ×4 IMPLANT
ELECT BLADE 4.0 EZ CLEAN MEGAD (MISCELLANEOUS) ×4
ELECT BLADE 6.5 EXT (BLADE) ×4 IMPLANT
ELECT CAUTERY BLADE 6.4 (BLADE) ×4 IMPLANT
ELECT REM PT RETURN 9FT ADLT (ELECTROSURGICAL) ×8
ELECTRODE BLDE 4.0 EZ CLN MEGD (MISCELLANEOUS) ×2 IMPLANT
ELECTRODE REM PT RTRN 9FT ADLT (ELECTROSURGICAL) ×4 IMPLANT
GAUZE SPONGE 4X4 12PLY STRL (GAUZE/BANDAGES/DRESSINGS) ×6 IMPLANT
GAUZE XEROFORM 1X8 LF (GAUZE/BANDAGES/DRESSINGS) ×2 IMPLANT
GLOVE BIO SURGEON STRL SZ7.5 (GLOVE) ×12 IMPLANT
GLOVE BIOGEL PI IND STRL 7.5 (GLOVE) IMPLANT
GLOVE BIOGEL PI INDICATOR 7.5 (GLOVE) ×2
GLOVE SURG SS PI 7.0 STRL IVOR (GLOVE) ×2 IMPLANT
GOWN STRL REUS W/ TWL LRG LVL3 (GOWN DISPOSABLE) ×8 IMPLANT
GOWN STRL REUS W/TWL LRG LVL3 (GOWN DISPOSABLE) ×16
HANDLE STAPLE  ENDO EGIA 4 STD (STAPLE) ×4
HANDLE STAPLE ENDO EGIA 4 STD (STAPLE) IMPLANT
KIT BASIN OR (CUSTOM PROCEDURE TRAY) ×4 IMPLANT
KIT TURNOVER KIT B (KITS) ×4 IMPLANT
NS IRRIG 1000ML POUR BTL (IV SOLUTION) ×8 IMPLANT
PACK GENERAL/GYN (CUSTOM PROCEDURE TRAY) ×4 IMPLANT
PACK OPEN HEART (CUSTOM PROCEDURE TRAY) ×4 IMPLANT
PAD ARMBOARD 7.5X6 YLW CONV (MISCELLANEOUS) ×8 IMPLANT
PAD ELECT DEFIB RADIOL ZOLL (MISCELLANEOUS) ×4 IMPLANT
POSITIONER HEAD DONUT 9IN (MISCELLANEOUS) ×4 IMPLANT
RELOAD TRI 2.0 30 VAS MED SUL (STAPLE) IMPLANT
SET TUBE SMOKE EVAC HIGH FLOW (TUBING) ×4 IMPLANT
SUT ETHILON 3 0 FSL (SUTURE) ×2 IMPLANT
SUT PROLENE 4 0 RB 1 (SUTURE) ×4
SUT PROLENE 4-0 RB1 .5 CRCL 36 (SUTURE) ×2 IMPLANT
SUT VIC AB 1 CTX 36 (SUTURE) ×8
SUT VIC AB 1 CTX36XBRD ANBCTR (SUTURE) ×4 IMPLANT
SUT VIC AB 2-0 CT1 27 (SUTURE) ×8
SUT VIC AB 2-0 CT1 TAPERPNT 27 (SUTURE) ×4 IMPLANT
SUT VIC AB 3-0 SH 8-18 (SUTURE) ×2 IMPLANT
SUT VIC AB 3-0 X1 27 (SUTURE) ×8 IMPLANT
TAPE CLOTH SURG 4X10 WHT LF (GAUZE/BANDAGES/DRESSINGS) ×2 IMPLANT
TOWEL GREEN STERILE (TOWEL DISPOSABLE) ×4 IMPLANT
TOWEL GREEN STERILE FF (TOWEL DISPOSABLE) ×4 IMPLANT
WATER STERILE IRR 1000ML POUR (IV SOLUTION) ×4 IMPLANT

## 2019-11-15 NOTE — Anesthesia Postprocedure Evaluation (Signed)
Anesthesia Post Note  Patient: [name]  Procedure(s) Performed: REMOVAL OF IMPELLA 5.5 LEFT VENTRICULAR ASSIST DEVICE (N/A Chest) TRANSESOPHAGEAL ECHOCARDIOGRAM (TEE) (N/A Esophagus)     Patient location during evaluation: SICU Anesthesia Type: General Level of consciousness: awake and alert Pain management: pain level controlled Vital Signs Assessment: post-procedure vital signs reviewed and stable Respiratory status: spontaneous breathing Cardiovascular status: stable Postop Assessment: no apparent nausea or vomiting Anesthetic complications: no   No complications documented.  Last Vitals:  Vitals:   11/15/19 0915 11/15/19 0930  BP: 123/81 127/74  Pulse: 88 90  Resp: 15 19  Temp:    SpO2: 99% 98%    Last Pain:  Vitals:   11/15/19 1007  TempSrc:   PainSc: 8                  Tyaisha Cullom P Torrance Frech

## 2019-11-15 NOTE — Progress Notes (Signed)
Pt transported to short stay room 37 by this RN and OR staff without event. This RN to stay at bedside to monitor impella until impella rep arrives.  Henreitta Leber, RN 6:50 AM. 11/15/19

## 2019-11-15 NOTE — Care Management (Signed)
Benefits check placed for cost of Entresto 24-26 mg po BID for 30 day supply.

## 2019-11-15 NOTE — Anesthesia Preprocedure Evaluation (Addendum)
Anesthesia Evaluation  Patient identified by MRN, date of birth, ID band Patient awake    Reviewed: Patient's Chart, lab work & pertinent test results  Airway Mallampati: II  TM Distance: >3 FB Neck ROM: Full    Dental  (+) Teeth Intact   Pulmonary former smoker,    Pulmonary exam normal        Cardiovascular hypertension, + CAD, + Past MI and + Peripheral Vascular Disease  + dysrhythmias Atrial Fibrillation  Rhythm:Irregular Rate:Normal  11/05/19 R/LHC 80% dLMCA stenosis small diffusely dz dLAD, chronically occluded OM1 50%mRCA lesion, widely patent mLCx strent, moderately elevated L heart filling pressures, mild to moderate RH filling pressures, normal to moderately reduced CO   Neuro/Psych negative neurological ROS  negative psych ROS   GI/Hepatic Neg liver ROS, hiatal hernia, GERD  Medicated,  Endo/Other  negative endocrine ROS  Renal/GU negative Renal ROS  negative genitourinary   Musculoskeletal  (+) Arthritis ,   Abdominal Normal abdominal exam  (+)  Abdomen: soft. Bowel sounds: normal.  Peds negative pediatric ROS (+)  Hematology negative hematology ROS (+)   Anesthesia Other Findings   Reproductive/Obstetrics                            Anesthesia Physical Anesthesia Plan  ASA: IV  Anesthesia Plan: General   Post-op Pain Management:    Induction: Intravenous  PONV Risk Score and Plan: 2 and Ondansetron and Dexamethasone  Airway Management Planned: Mask and Oral ETT  Additional Equipment: Arterial line, CVP and TEE  Intra-op Plan:   Post-operative Plan: Extubation in OR  Informed Consent: I have reviewed the patients History and Physical, chart, labs and discussed the procedure including the risks, benefits and alternatives for the proposed anesthesia with the patient or authorized representative who has indicated his/her understanding and acceptance.     Dental  advisory given  Plan Discussed with: CRNA and Surgeon  Anesthesia Plan Comments: (S/P CABG 11/11/19 with 5.5 impella placement 2/2 difficulty coming off CPB given right and left heart cardiac catheterization 11/05/19 that showed multivessel CAD, including calcified 80% distal left main stenosis, small diffusely disease distal LAD, chronically occluded OM1, and 50% mRCA lesion with widely patent mid left circumflex stent   Lab Results      Component                Value               Date                      WBC                      8.6                 11/15/2019                HGB                      8.5 (L)             11/15/2019                HCT                      25.0 (L)            11/15/2019  MCV                      93.9                11/15/2019                PLT                      130 (L)             11/15/2019           Covid-19 Nucleic Acid Test Results Lab Results      Component                Value               Date                      SARSCOV2NAA              NEGATIVE            11/01/2019                [place]              NEGATIVE            02/07/2019                [place]              NEGATIVE            09/10/2018          )        Anesthesia Quick Evaluation

## 2019-11-15 NOTE — Progress Notes (Signed)
Pre Procedure note for inpatients:   [name] has been scheduled for Procedure(s): REMOVAL OF IMPELLA 5.5 LEFT VENTRICULAR ASSIST DEVICE (N/A) TRANSESOPHAGEAL ECHOCARDIOGRAM (TEE) (N/A) today. The various methods of treatment have been discussed with the patient. After consideration of the risks, benefits and treatment options the patient has consented to the planned procedure.   The patient has been seen and labs reviewed. There are no changes in the patient's condition to prevent proceeding with the planned procedure today.  Recent labs:  Lab Results  Component Value Date   WBC 8.6 11/15/2019   HGB 8.5 (L) 11/15/2019   HCT 25.0 (L) 11/15/2019   PLT 130 (L) 11/15/2019   GLUCOSE 123 (H) 11/15/2019   CHOL 154 10/24/2019   TRIG 120 10/24/2019   HDL 61 10/24/2019   LDLCALC 69 10/24/2019   ALT 20 11/15/2019   AST 23 11/15/2019   NA 133 (L) 11/15/2019   K 3.8 11/15/2019   CL 97 (L) 11/15/2019   CREATININE 0.96 11/15/2019   BUN 22 11/15/2019   CO2 29 11/15/2019   TSH 3.303 11/07/2019   PSA 0.83 06/15/2015   INR 1.4 (H) 11/11/2019   HGBA1C 5.6 11/07/2019    Len Childs, MD 11/15/2019 7:23 AM

## 2019-11-15 NOTE — Anesthesia Procedure Notes (Signed)
Procedure Name: Intubation Date/Time: 11/15/2019 7:54 AM Performed by: Lance Coon, CRNA Pre-anesthesia Checklist: Patient identified, Emergency Drugs available, Suction available and Patient being monitored Patient Re-evaluated:Patient Re-evaluated prior to induction Oxygen Delivery Method: Circle System Utilized Preoxygenation: Pre-oxygenation with 100% oxygen Induction Type: IV induction Ventilation: Mask ventilation without difficulty Laryngoscope Size: Mac and 4 Grade View: Grade I Tube type: Oral Tube size: 8.0 mm Number of attempts: 1 Airway Equipment and Method: Stylet and Oral airway Placement Confirmation: ETT inserted through vocal cords under direct vision,  positive ETCO2 and breath sounds checked- equal and bilateral Secured at: 23 cm Tube secured with: Tape Dental Injury: Teeth and Oropharynx as per pre-operative assessment  Comments: Placed by G. Sonia Baller

## 2019-11-15 NOTE — Plan of Care (Signed)
  Problem: Education: Goal: Ability to demonstrate management of disease process will improve Outcome: Progressing Goal: Ability to verbalize understanding of medication therapies will improve Outcome: Progressing Goal: Individualized Educational Video(s) Outcome: Progressing   Problem: Activity: Goal: Capacity to carry out activities will improve Outcome: Progressing   Problem: Cardiac: Goal: Ability to achieve and maintain adequate cardiopulmonary perfusion will improve Outcome: Progressing   Problem: Education: Goal: Knowledge of General Education information will improve Description: Including pain rating scale, medication(s)/side effects and non-pharmacologic comfort measures Outcome: Progressing   Problem: Health Behavior/Discharge Planning: Goal: Ability to manage health-related needs will improve Outcome: Progressing   Problem: Clinical Measurements: Goal: Ability to maintain clinical measurements within normal limits will improve Outcome: Progressing Goal: Will remain free from infection Outcome: Progressing Goal: Diagnostic test results will improve Outcome: Progressing Goal: Respiratory complications will improve Outcome: Progressing Goal: Cardiovascular complication will be avoided Outcome: Progressing   Problem: Activity: Goal: Risk for activity intolerance will decrease Outcome: Progressing   Problem: Nutrition: Goal: Adequate nutrition will be maintained Outcome: Progressing   Problem: Coping: Goal: Level of anxiety will decrease Outcome: Progressing   Problem: Elimination: Goal: Will not experience complications related to bowel motility Outcome: Progressing Goal: Will not experience complications related to urinary retention Outcome: Progressing   Problem: Pain Managment: Goal: General experience of comfort will improve Outcome: Progressing   Problem: Safety: Goal: Ability to remain free from injury will improve Outcome: Progressing    Problem: Skin Integrity: Goal: Risk for impaired skin integrity will decrease Outcome: Progressing   Problem: Education: Goal: Will demonstrate proper wound care and an understanding of methods to prevent future damage Outcome: Progressing Goal: Knowledge of disease or condition will improve Outcome: Progressing Goal: Knowledge of the prescribed therapeutic regimen will improve Outcome: Progressing Goal: Individualized Educational Video(s) Outcome: Progressing   Problem: Activity: Goal: Risk for activity intolerance will decrease Outcome: Progressing   Problem: Cardiac: Goal: Will achieve and/or maintain hemodynamic stability Outcome: Progressing   Problem: Clinical Measurements: Goal: Postoperative complications will be avoided or minimized Outcome: Progressing   Problem: Respiratory: Goal: Respiratory status will improve Outcome: Progressing   Problem: Skin Integrity: Goal: Wound healing without signs and symptoms of infection Outcome: Progressing Goal: Risk for impaired skin integrity will decrease Outcome: Progressing   Problem: Urinary Elimination: Goal: Ability to achieve and maintain adequate renal perfusion and functioning will improve Outcome: Progressing

## 2019-11-15 NOTE — Brief Op Note (Signed)
11/15/2019  8:44 AM  PATIENT:  [name]  69 y.o. male  PRE-OPERATIVE DIAGNOSIS:  CHF CAD  POST-OPERATIVE DIAGNOSIS:  CHF CAD  PROCEDURE:  Procedure(s): REMOVAL OF IMPELLA 5.5 LEFT VENTRICULAR ASSIST DEVICE (N/A) TRANSESOPHAGEAL ECHOCARDIOGRAM (TEE) (N/A)  SURGEON:  Surgeon(s) and Role:    Ivin Poot, MD - Primary  PHYSICIAN ASSISTANT:   ASSISTANTS: none   ANESTHESIA:   general  EBL:  20cc   BLOOD ADMINISTERED:none  DRAINS: none   LOCAL MEDICATIONS USED:  NONE  SPECIMEN:  No Specimen  DISPOSITION OF SPECIMEN:  N/A  COUNTS:  YES  TOURNIQUET:  * No tourniquets in log *  DICTATION: .Dragon Dictation  PLAN OF CARE: return to ICU  PATIENT DISPOSITION:  ICU - extubated and stable.   Delay start of Pharmacological VTE agent (>24hrs) due to surgical blood loss or risk of bleeding: yes

## 2019-11-15 NOTE — Transfer of Care (Signed)
Immediate Anesthesia Transfer of Care Note  Patient: [name]  Procedure(s) Performed: REMOVAL OF IMPELLA 5.5 LEFT VENTRICULAR ASSIST DEVICE (N/A Chest) TRANSESOPHAGEAL ECHOCARDIOGRAM (TEE) (N/A Esophagus)  Patient Location: SICU  Anesthesia Type:General  Level of Consciousness: awake, alert  and oriented  Airway & Oxygen Therapy: Patient Spontanous Breathing and Patient connected to face mask oxygen  Post-op Assessment: Report given to RN, Post -op Vital signs reviewed and stable and Patient moving all extremities X 4  Post vital signs: Reviewed and stable  Last Vitals:  Vitals Value Taken Time  BP    Temp    Pulse    Resp    SpO2      Last Pain:  Vitals:   11/15/19 0700  TempSrc: Bladder  PainSc:       Patients Stated Pain Goal: 3 (97/35/32 9924)  Complications: No complications documented.

## 2019-11-15 NOTE — Progress Notes (Signed)
  Echocardiogram Echocardiogram Transesophageal has been performed.  [name] 11/15/2019, 8:39 AM

## 2019-11-15 NOTE — Op Note (Signed)
NAME: [name], [name] YY:[id number] ACCOUNT [id number] DATE OF BIRTH:Jun 20, 1950 FACILITY: [facility] LOCATION: [facility] PHYSICIAN:Mir Fullilove VAN TRIGT III, MD  OPERATIVE REPORT  DATE OF PROCEDURE:  11/15/2019  OPERATION:  Removal of Impella 5.5 LVAD.  SURGEON:  Ivin Poot, MD  ANESTHESIA:  General.  PREOPERATIVE DIAGNOSIS:  Ischemic cardiomyopathy, status post coronary artery bypass graft x4 with postoperative cardiac support with Impella 5.5 catheter placed through a 10 mm graft in the ascending aorta.  POSTOPERATIVE DIAGNOSIS:  Ischemic cardiomyopathy, status post coronary artery bypass graft x4 with postoperative cardiac support with Impella 5.5 catheter placed through a 10 mm graft in the ascending aorta.  DESCRIPTION OF PROCEDURE:  The patient was brought from preoperative holding where informed consent was documented and final issues regarding the procedure were discussed with the patient and all questions addressed.  The patient was placed supine on the  operating table and general anesthesia was induced.  He remained stable.  A transesophageal echo probe was placed by the anesthesia team.  This showed some improvement in his global LV function with EF 25-30%.  There was mild MR.  The Impella catheter  was in good position.  The chest and neck were prepped and draped as a sterile field.  A proper time-out was performed.  The sutures securing the Impella catheter to the graft were removed.  The graft was coated with some extra Betadine.  The catheter was then withdrawn  through the graft and followed on echo.  When the Impella was through the aortic valve the pump was turned off.  There was some resistance to withdrawal, but gentle dilatation of the neck fascia allowed the catheter to be removed as intended.  The graft  was clamped with a vascular clamp, and an Endo-GIA stapling device was used to close and divide the graft.  There was no bleeding from the graft site.  The  wound was irrigated.  It was closed in layers using 3-0 Vicryl for the subcutaneous layer and  interrupted 3-0 nylon for the skin.  A sterile dressing was applied.  The patient was extubated in the operating room and returned to the ICU in stable condition.  CN/NUANCE  D:11/15/2019 T:11/15/2019 JOB:[id number]

## 2019-11-15 NOTE — Progress Notes (Signed)
Patient ID: [name], male   DOB: October 09, 1950, 69 y.o.   MRN: [id number]     Advanced Heart Failure Rounding Note  PCP-Cardiologist: Nelva Bush, MD   Subjective:    S/P CABG with direct Impella placed with difficulty weaning from bypass. Impella removed in OR today.   Now back from OR, CVP 14-15 on my measurement. Co-ox 59% after return from OR, milrinone back to 0.25.  He is off NE.  SBP in 140s.    He is A-V sequentially paced, on amiodarone gtt at 30.   Objective:   Weight Range: 85.4 kg Body mass index is 30.39 kg/m.   Vital Signs:   Temp:  [98.2 F (36.8 C)-100.6 F (38.1 C)] 98.4 F (36.9 C) (09/17 0700) Pulse Rate:  [55-109] 90 (09/17 0930) Resp:  [7-22] 19 (09/17 0930) BP: (101-127)/(70-92) 127/74 (09/17 0930) SpO2:  [88 %-100 %] 98 % (09/17 0930) Arterial Line BP: (105-159)/(48-74) 155/67 (09/17 0930) Weight:  [85.4 kg] 85.4 kg (09/17 0500) Last BM Date: 11/13/19  Weight change: Filed Weights   11/12/19 0400 11/13/19 0615 11/15/19 0500  Weight: 86.7 kg 88 kg 85.4 kg    Intake/Output:   Intake/Output Summary (Last 24 hours) at 11/15/2019 0957 Last data filed at 11/15/2019 0900 Gross per 24 hour  Intake 3100.39 ml  Output 3175 ml  Net -74.61 ml      Physical Exam   General: NAD Neck: JVP 12 cm, no thyromegaly or thyroid nodule.  Lungs: Decreased at bases.  CV: Nondisplaced PMI.  Heart regular S1/S2, no S3/S4, no murmur.  No peripheral edema.   Abdomen: Soft, nontender, no hepatosplenomegaly, no distention.  Skin: Intact without lesions or rashes.  Neurologic: Alert and oriented x 3.  Psych: Normal affect. Extremities: No clubbing or cyanosis.  HEENT: Normal.     Telemetry   A-V sequential pacing in 80s (personally reviewed)  Labs    CBC Recent Labs    11/14/19 0318 11/14/19 1821 11/15/19 0349 11/15/19 0349 11/15/19 0621 11/15/19 0925  WBC 10.3  --  8.6  --   --   --   HGB 7.6*   < > 8.3*   < > 8.5* 8.2*  HCT 23.7*   < >  26.1*   < > 25.0* 24.0*  MCV 95.2  --  93.9  --   --   --   PLT 122*  --  130*  --   --   --    < > = values in this interval not displayed.   Basic Metabolic Panel Recent Labs    11/13/19 1512 11/13/19 1512 11/14/19 0318 11/14/19 1821 11/15/19 0349 11/15/19 0349 11/15/19 0621 11/15/19 0925  NA 132*   < > 134*   < > 133*   < > 133* 134*  K 3.9   < > 3.8   < > 3.9   < > 3.8 3.9  CL 96*   < > 99  --  97*  --   --   --   CO2 25   < > 26  --  29  --   --   --   GLUCOSE 122*   < > 112*  --  123*  --   --   --   BUN 19   < > 22  --  22  --   --   --   CREATININE 0.96   < > 1.07  --  0.96  --   --   --  CALCIUM 8.0*   < > 7.8*  --  8.0*  --   --   --   MG 2.2  --  2.1  --   --   --   --   --    < > = values in this interval not displayed.   Liver Function Tests Recent Labs    11/14/19 0318 11/15/19 0349  AST 29 23  ALT 20 20  ALKPHOS 41 51  BILITOT 0.9 0.7  PROT 5.5* 5.9*  ALBUMIN 3.1* 2.9*   No results for input(s): LIPASE, AMYLASE in the last 72 hours. Cardiac Enzymes No results for input(s): CKTOTAL, CKMB, CKMBINDEX, TROPONINI in the last 72 hours.  BNP: BNP (last 3 results) Recent Labs    11/05/19 2228  BNP 468.7*    ProBNP (last 3 results) No results for input(s): PROBNP in the last 8760 hours.   D-Dimer No results for input(s): DDIMER in the last 72 hours. Hemoglobin A1C No results for input(s): HGBA1C in the last 72 hours. Fasting Lipid Panel No results for input(s): CHOL, HDL, LDLCALC, TRIG, CHOLHDL, LDLDIRECT in the last 72 hours. Thyroid Function Tests No results for input(s): TSH, T4TOTAL, T3FREE, THYROIDAB in the last 72 hours.  Invalid input(s): FREET3  Other results:   Imaging    DG Chest Port 1 View  Result Date: 11/15/2019 CLINICAL DATA:  Postop CABG. EXAM: PORTABLE CHEST 1 VIEW COMPARISON:  11/15/2019 and older studies. FINDINGS: Linear and hazy lung opacities are noted in the mid and lower lungs, greater on the left, consistent  with atelectasis associated with small effusions. This appears stable from the previous day's study, allowing for differences in patient positioning, lung volumes and technique. No pneumothorax.  No mediastinal widening. Mediastinal tube, right internal jugular introducer sheath and right-sided PICC are stable. IMPRESSION: 1. No significant change from the previous day's study. 2. Pleural effusions and mid and lower lung atelectasis, without overt pulmonary edema. 3. No pneumothorax or mediastinal widening. Electronically Signed   By: Lajean Manes M.D.   On: 11/15/2019 09:40   DG Chest Port 1 View  Result Date: 11/15/2019 CLINICAL DATA:  CABG EXAM: PORTABLE CHEST 1 VIEW COMPARISON:  11/14/2019 FINDINGS: Changes of CABG. Impella device remains in place, unchanged. Cardiomegaly with vascular congestion, perihilar and bibasilar opacities, likely atelectasis. No effusions. Interval removal of left chest tube without pneumothorax. IMPRESSION: Cardiomegaly with vascular congestion and areas of atelectasis. No pneumothorax. Electronically Signed   By: Rolm Baptise M.D.   On: 11/15/2019 05:53     Medications:     Scheduled Medications: . acetaminophen  1,000 mg Oral Q6H   Or  . acetaminophen (TYLENOL) oral liquid 160 mg/5 mL  1,000 mg Per Tube Q6H  . aspirin EC  325 mg Oral Daily   Or  . aspirin  324 mg Per Tube Daily  . bisacodyl  10 mg Oral Daily   Or  . bisacodyl  10 mg Rectal Daily  . carvedilol  3.125 mg Oral BID WC  . digoxin  0.125 mg Oral Daily  . docusate sodium  200 mg Oral Daily  . feeding supplement (ENSURE ENLIVE)  237 mL Oral TID WC  . furosemide  40 mg Intravenous Once  . insulin aspart  0-15 Units Subcutaneous TID WC  . levalbuterol  0.63 mg Nebulization TID  . mouth rinse  15 mL Mouth Rinse BID  . mupirocin ointment  1 application Nasal BID  . pantoprazole  40 mg Oral Daily  .  sacubitril-valsartan  1 tablet Oral BID  . sodium chloride flush  3 mL Intravenous Q12H  .  spironolactone  12.5 mg Oral Daily    Infusions: . sodium chloride Stopped (11/13/19 0924)  . sodium chloride    . sodium chloride 10 mL/hr at 11/11/19 2047  . amiodarone 30 mg/hr (11/15/19 0916)  . furosemide (LASIX) infusion    . lactated ringers 20 mL/hr at 11/15/19 0630  . milrinone 0.125 mcg/kg/min (11/15/19 0729)    PRN Medications: sodium chloride, LORazepam, morphine injection, ondansetron (ZOFRAN) IV, oxyCODONE, pneumococcal 23 valent vaccine, sodium chloride flush, traMADol    Assessment/Plan   1. CAD: H/o PCI to LCX in 2017. LHC 11/05/19 with multivessel CAD, including calcified 80% distal LM stenosis, small diffusely diseased distal LAD, chronically occluded OM1, and 50% mid RCA lesion.  He is now s/p LIMA-LAD, SVG-OM1, SVG-OM2 and SVG-PDA.  - Intolerance of statins, he is on Praluent at home. - Continue ASA.  2. Acute systolic CHF: Ischemic cardiomyopathy.  Echo pre-op with EF 25-30%.  Impella 5.5 removed 9/17.  Now off norepinephrine and on milrinone at 0.25 with co-ox 59%.  BP actually high.  CVP 14-15.  - Continue milrinone 0.25 for now.  - Lasix 40 mg IV x 1 bolus and gtt at 10 mg/hr today.  - Continue spironolactone 12.5 mg daily. - He has BP room to start Entresto 24/26 bid.  - If EF remains low after CABG, will be CRT-D candidate with LBBB.  3. PVCs: Frequent.  Now on amiodarone.  4. Paroxysmal atrial fibrillation: Noted post-op.  Now in NSR.  - Decrease amiodarone gtt to 30 mg/hr.  Continue IV until off milrinone.    - No anticoagulation yet, will need if atrial fibrillation recurs.   CRITICAL CARE Performed by: Loralie Champagne  Total critical care time: 35 minutes  Critical care time was exclusive of separately billable procedures and treating other patients.  Critical care was necessary to treat or prevent imminent or life-threatening deterioration.  Critical care was time spent personally by me on the following activities: development of treatment plan  with patient and/or surrogate as well as nursing, discussions with consultants, evaluation of patient's response to treatment, examination of patient, obtaining history from patient or surrogate, ordering and performing treatments and interventions, ordering and review of laboratory studies, ordering and review of radiographic studies, pulse oximetry and re-evaluation of patient's condition.  Length of Stay: [place], MD  11/15/2019, 9:57 AM  Advanced Heart Failure Team Pager [phone] (M-F; 7a - 4p)  Please contact [facility] for night-coverage after hours (4p -7a ) and weekends on [email or web address]

## 2019-11-15 NOTE — Progress Notes (Signed)
TCTS BRIEF SICU PROGRESS NOTE  Day of Surgery  S/P Procedure(s) (LRB): REMOVAL OF IMPELLA 5.5 LEFT VENTRICULAR ASSIST DEVICE (N/A) TRANSESOPHAGEAL ECHOCARDIOGRAM (TEE) (N/A)   Stable since return from OR Sitting up in chair AV paced w/ stable BP off all drips Breathing comfortably on nasal cannula Diuresing well on lasix drip  Plan: Continue current plan  Rexene Alberts, MD 11/15/2019 3:35 PM

## 2019-11-15 NOTE — TOC Benefit Eligibility Note (Signed)
Transition of Care [facility]) Benefit Eligibility Note    Patient Details  Name: [name] MRN: [id number] Date of Birth: 1950/12/11   Medication/Dose: Eliquis 5mg . bid for 30 day supply ,Xarelto 10mg  bid for 30 day supply  Covered?: Yes  Tier: 3 Drug  Prescription Coverage Preferred Pharmacy: [facility]  Spoke with Person/Company/Phone Number:: [employer] PH# [phone]  Co-Pay: Eliquis 5 mg. $141.16 and  Xarelto 10mg . $261.21  Prior Approval: No (Entresto 24-26 mg PA required@[phone])  Deductible:  (no deductible)       Shelda Altes Phone Number: 11/15/2019, 3:48 PM

## 2019-11-16 ENCOUNTER — Inpatient Hospital Stay (HOSPITAL_COMMUNITY): Payer: PPO

## 2019-11-16 LAB — CBC
HCT: 26.7 % — ABNORMAL LOW (ref 39.0–52.0)
Hemoglobin: 8.6 g/dL — ABNORMAL LOW (ref 13.0–17.0)
MCH: 30.1 pg (ref 26.0–34.0)
MCHC: 32.2 g/dL (ref 30.0–36.0)
MCV: 93.4 fL (ref 80.0–100.0)
Platelets: 182 10*3/uL (ref 150–400)
RBC: 2.86 MIL/uL — ABNORMAL LOW (ref 4.22–5.81)
RDW: 13.6 % (ref 11.5–15.5)
WBC: 12.1 10*3/uL — ABNORMAL HIGH (ref 4.0–10.5)
nRBC: 0.6 % — ABNORMAL HIGH (ref 0.0–0.2)

## 2019-11-16 LAB — POCT I-STAT 7, (LYTES, BLD GAS, ICA,H+H)
Acid-Base Excess: 11 mmol/L — ABNORMAL HIGH (ref 0.0–2.0)
Bicarbonate: 35.7 mmol/L — ABNORMAL HIGH (ref 20.0–28.0)
Calcium, Ion: 1.14 mmol/L — ABNORMAL LOW (ref 1.15–1.40)
HCT: 26 % — ABNORMAL LOW (ref 39.0–52.0)
Hemoglobin: 8.8 g/dL — ABNORMAL LOW (ref 13.0–17.0)
O2 Saturation: 94 %
Patient temperature: 97.6
Potassium: 4.1 mmol/L (ref 3.5–5.1)
Sodium: 136 mmol/L (ref 135–145)
TCO2: 37 mmol/L — ABNORMAL HIGH (ref 22–32)
pCO2 arterial: 49 mmHg — ABNORMAL HIGH (ref 32.0–48.0)
pH, Arterial: 7.467 — ABNORMAL HIGH (ref 7.350–7.450)
pO2, Arterial: 66 mmHg — ABNORMAL LOW (ref 83.0–108.0)

## 2019-11-16 LAB — COMPREHENSIVE METABOLIC PANEL
ALT: 19 U/L (ref 0–44)
AST: 15 U/L (ref 15–41)
Albumin: 2.9 g/dL — ABNORMAL LOW (ref 3.5–5.0)
Alkaline Phosphatase: 52 U/L (ref 38–126)
Anion gap: 9 (ref 5–15)
BUN: 19 mg/dL (ref 8–23)
CO2: 32 mmol/L (ref 22–32)
Calcium: 8.5 mg/dL — ABNORMAL LOW (ref 8.9–10.3)
Chloride: 96 mmol/L — ABNORMAL LOW (ref 98–111)
Creatinine, Ser: 0.92 mg/dL (ref 0.61–1.24)
GFR calc Af Amer: >60 mL/min (ref >60)
GFR calc non Af Amer: >60 mL/min (ref >60)
Glucose, Bld: 163 mg/dL — ABNORMAL HIGH (ref 70–99)
Potassium: 4.1 mmol/L (ref 3.5–5.1)
Sodium: 137 mmol/L (ref 135–145)
Total Bilirubin: 0.5 mg/dL (ref 0.3–1.2)
Total Protein: 6 g/dL — ABNORMAL LOW (ref 6.5–8.1)

## 2019-11-16 LAB — COOXEMETRY PANEL
Carboxyhemoglobin: 1.5 % (ref 0.5–1.5)
Methemoglobin: 1.4 % (ref 0.0–1.5)
O2 Saturation: 63.9 %
Total hemoglobin: 9 g/dL — ABNORMAL LOW (ref 12.0–16.0)

## 2019-11-16 LAB — GLUCOSE, CAPILLARY
Glucose-Capillary: 168 mg/dL — ABNORMAL HIGH (ref 70–99)
Glucose-Capillary: 163 mg/dL — ABNORMAL HIGH (ref 70–99)
Glucose-Capillary: 122 mg/dL — ABNORMAL HIGH (ref 70–99)

## 2019-11-16 LAB — MAGNESIUM: Magnesium: 2 mg/dL (ref 1.7–2.4)

## 2019-11-16 MED ORDER — AMIODARONE IV BOLUS ONLY 150 MG/100ML: 150.0000 mg | Freq: Once | INTRAVENOUS | Status: DC

## 2019-11-16 MED ORDER — LEVALBUTEROL HCL 0.63 MG/3ML IN NEBU: 0.6300 mg | INHALATION_SOLUTION | Freq: Four times a day (QID) | RESPIRATORY_TRACT | Status: DC | PRN

## 2019-11-16 MED ORDER — TRAZODONE HCL 100 MG PO TABS
100.0000 mg | ORAL_TABLET | Freq: Every evening | ORAL | Status: DC | PRN
  Administered 2019-11-16 – 2019-11-20 (×4): 100 mg via ORAL
  Filled 2019-11-16: qty 1
  Filled 2019-11-16 (×3): qty 2

## 2019-11-16 MED ORDER — AMIODARONE LOAD VIA INFUSION
150.0000 mg | Freq: Once | INTRAVENOUS | Status: AC
  Administered 2019-11-16: 150 mg via INTRAVENOUS
  Filled 2019-11-16: qty 83.34

## 2019-11-16 MED ORDER — ~~LOC~~ CARDIAC SURGERY, PATIENT & FAMILY EDUCATION: Freq: Once | Status: AC

## 2019-11-16 NOTE — Progress Notes (Signed)
Inpatient Rehab Admissions Coordinator:   Per therapy recommendations, pt was screened for CIR candidacy by Clemens Catholic, [place] CCC-SLP. At this time, Pt. Appears to have functional decline and is a good candidate for CIR. Will pursue order for rehab consult per protocol.  Please contact me with questions.   Clemens Catholic, [place], [place] Admissions Coordinator  [phone] ([place]) [phone] (office)

## 2019-11-16 NOTE — Progress Notes (Signed)
TCTS BRIEF SICU PROGRESS NOTE  1 Day Post-Op  S/P Procedure(s) (LRB): REMOVAL OF IMPELLA 5.5 LEFT VENTRICULAR ASSIST DEVICE (N/A) TRANSESOPHAGEAL ECHOCARDIOGRAM (TEE) (N/A)   Stable day  Plan: Continue current plan  Rexene Alberts, MD 11/16/2019 6:04 PM

## 2019-11-16 NOTE — Progress Notes (Signed)
Paged Dr. Roxy Manns.  Dr. Roxy Manns called back and RN made MD aware that patient is in a wide QRS rhythm with occasional V-paced beats and that EKG was performed and shows Afib with RVR.  Rate is in 120's and patient on amiodarone drip.  MD gave order for amiodarone bolus 150 mg. No further orders received.

## 2019-11-16 NOTE — Progress Notes (Signed)
[address]       [place] [address]             [phone]        CARDIOTHORACIC SURGERY PROGRESS NOTE   R1 Day Post-Op Procedure(s) (LRB): REMOVAL OF IMPELLA 5.5 LEFT VENTRICULAR ASSIST DEVICE (N/A) TRANSESOPHAGEAL ECHOCARDIOGRAM (TEE) (N/A)  Subjective: Looks good.  Feels well.  Minimal pain.  Denies SOB.  Ambulated already this morning  Objective: Vital signs: BP Readings from Last 1 Encounters:  11/16/19 99/62   Pulse Readings from Last 1 Encounters:  11/16/19 90   Resp Readings from Last 1 Encounters:  11/16/19 18   Temp Readings from Last 1 Encounters:  11/16/19 98.5 F (36.9 C)    Hemodynamics: CVP:  [10 mmHg] 10 mmHg  Physical Exam:  Rhythm:   DDD paced  Breath sounds: clear  Heart sounds:  RRR  Incisions:  Clean and dry  Abdomen:  Soft, non-distended, non-tender  Extremities:  Warm, well-perfused    Intake/Output from previous day: 09/17 0701 - 09/18 0700 In: 1690.3 [P.O.:360; I.V.:1330.3] Out: 6430 [Urine:6260; Blood:100; Chest Tube:70] Intake/Output this shift: Total I/O In: 85.1 [I.V.:85.1] Out: -   Lab Results:  CBC: Recent Labs    11/15/19 0930 11/15/19 0930 11/16/19 0440 11/16/19 0449  WBC 6.9  --   --  12.1*  HGB 8.4*   < > 8.8* 8.6*  HCT 26.3*   < > 26.0* 26.7*  PLT 127*  --   --  182   < > = values in this interval not displayed.    BMET:  Recent Labs    11/15/19 1400 11/15/19 1400 11/16/19 0440 11/16/19 0449  NA 133*   < > 136 137  K 4.1   < > 4.1 4.1  CL 94*  --   --  96*  CO2 29  --   --  32  GLUCOSE 213*  --   --  163*  BUN 20  --   --  19  CREATININE 0.93  --   --  0.92  CALCIUM 8.3*  --   --  8.5*   < > = values in this interval not displayed.     PT/INR:  No results for input(s): LABPROT, INR in the last 72 hours.  CBG (last 3)  Recent Labs    11/15/19 1200 11/15/19 1615 11/16/19 0820  GLUCAP 159* 174* 168*    ABG    Component Value Date/Time   PHART 7.467 (H)  11/16/2019 0440   PCO2ART 49.0 (H) 11/16/2019 0440   PO2ART 66 (L) 11/16/2019 0440   HCO3 35.7 (H) 11/16/2019 0440   TCO2 37 (H) 11/16/2019 0440   ACIDBASEDEF 4.0 (H) 11/11/2019 2301   O2SAT 63.9 11/16/2019 0441    CXR: PORTABLE CHEST 1 VIEW  COMPARISON:  11/15/2019  FINDINGS: Sternotomy wires unchanged. Right IJ central venous sheath unchanged. Right-sided PICC line and mediastinal drain unchanged.  Lungs are adequately inflated with improved perihilar opacification. Improving aeration in the left base likely minimal residual left basilar atelectasis/effusion. No pneumothorax. Stable cardiomegaly. Remainder of the exam is unchanged.  IMPRESSION: 1. Interval improvement in mild vascular congestion and improved left base opacification likely improving atelectasis/effusion. 2. Tubes and lines as described.   Electronically Signed   By: Marin Olp M.D.   On: 11/16/2019 09:22   Assessment/Plan: S/P Procedure(s) (LRB): REMOVAL OF IMPELLA 5.5 LEFT VENTRICULAR ASSIST DEVICE (N/A) TRANSESOPHAGEAL ECHOCARDIOGRAM (TEE) (N/A)  Doing very well  since removal of Impella NSR under pacer w/ LBBB - AV pacing BP stable w/ co-ox 64% on milrinone 0.25 Breathing comfortably w/ O2 sats 95% on 2 L/min and CXR looks good Expected post op acute blood loss anemia, stable Acute on chronic systolic CHF with expected post-op volume excess, diuresing well   Continue milrinone, lasix for now  Mobilize  D/C tubes and Foley  Watch anemia  Rexene Alberts, MD 11/16/2019 9:54 AM

## 2019-11-16 NOTE — Progress Notes (Signed)
Patient ID: [name], male   DOB: 23-Jun-1950, 69 y.o.   MRN: [id number]     Advanced Heart Failure Rounding Note  PCP-Cardiologist: Nelva Bush, MD   Subjective:    S/P CABG with direct Impella placed with difficulty weaning from bypass. Impella removed in OR 9/17  On milrinone 0.25. Lasix gtt at 10  CVP 12  Co-ox 64%   He is A-V sequentially paced at 90, on amiodarone gtt at 30.   Feeling better. But c/o lack of sleep and bothered by SCDs. Chest sore but no dyspnea.   Objective:   Weight Range: 83.3 kg Body mass index is 29.64 kg/m.   Vital Signs:   Temp:  [97.5 F (36.4 C)-98.5 F (36.9 C)] 98.5 F (36.9 C) (09/18 0800) Pulse Rate:  [87-105] 90 (09/18 0900) Resp:  [7-19] 18 (09/18 0900) BP: (87-129)/(54-75) 99/62 (09/18 0900) SpO2:  [90 %-100 %] 95 % (09/18 0900) Arterial Line BP: (94-164)/(41-71) 119/55 (09/18 0900) Weight:  [83.3 kg] 83.3 kg (09/18 0500) Last BM Date: 11/13/19  Weight change: Filed Weights   11/13/19 0615 11/15/19 0500 11/16/19 0500  Weight: 88 kg 85.4 kg 83.3 kg    Intake/Output:   Intake/Output Summary (Last 24 hours) at 11/16/2019 0922 Last data filed at 11/16/2019 0900 Gross per 24 hour  Intake 1625.32 ml  Output 6200 ml  Net -4574.68 ml      Physical Exam   General:  Sitting in chair No resp difficulty HEENT: normal Neck: supple.RIJ introducer. Carotids 2+ bilat; no bruits. No lymphadenopathy or thryomegaly appreciated. Cor: 2 CTs  Sternal wound ok PMI nondisplaced. Regular rate & rhythm. No rubs, gallops or murmurs. Lungs: clear Abdomen: soft, nontender, mildly distended. No hepatosplenomegaly. No bruits or masses. Good bowel sounds. Extremities: no cyanosis, clubbing, rash, tr edema Neuro: alert & orientedx3, cranial nerves grossly intact. moves all 4 extremities w/o difficulty. Affect pleasant     Telemetry   A-V sequential pacing at 90 NSR with LBBB at 60 underneath  (personally reviewed)  Labs    CBC Recent  Labs    11/15/19 0930 11/15/19 0930 11/16/19 0440 11/16/19 0449  WBC 6.9  --   --  12.1*  HGB 8.4*   < > 8.8* 8.6*  HCT 26.3*   < > 26.0* 26.7*  MCV 94.6  --   --  93.4  PLT 127*  --   --  182   < > = values in this interval not displayed.   Basic Metabolic Panel Recent Labs    11/14/19 0318 11/14/19 1821 11/15/19 1400 11/15/19 1400 11/16/19 0440 11/16/19 0449  NA 134*   < > 133*   < > 136 137  K 3.8   < > 4.1   < > 4.1 4.1  CL 99   < > 94*  --   --  96*  CO2 26   < > 29  --   --  32  GLUCOSE 112*   < > 213*  --   --  163*  BUN 22   < > 20  --   --  19  CREATININE 1.07   < > 0.93  --   --  0.92  CALCIUM 7.8*   < > 8.3*  --   --  8.5*  MG 2.1  --   --   --   --  2.0   < > = values in this interval not displayed.   Liver Function Tests Recent Labs  11/15/19 0349 11/16/19 0449  AST 23 15  ALT 20 19  ALKPHOS 51 52  BILITOT 0.7 0.5  PROT 5.9* 6.0*  ALBUMIN 2.9* 2.9*   No results for input(s): LIPASE, AMYLASE in the last 72 hours. Cardiac Enzymes No results for input(s): CKTOTAL, CKMB, CKMBINDEX, TROPONINI in the last 72 hours.  BNP: BNP (last 3 results) Recent Labs    11/05/19 2228  BNP 468.7*    ProBNP (last 3 results) No results for input(s): PROBNP in the last 8760 hours.   D-Dimer No results for input(s): DDIMER in the last 72 hours. Hemoglobin A1C No results for input(s): HGBA1C in the last 72 hours. Fasting Lipid Panel No results for input(s): CHOL, HDL, LDLCALC, TRIG, CHOLHDL, LDLDIRECT in the last 72 hours. Thyroid Function Tests No results for input(s): TSH, T4TOTAL, T3FREE, THYROIDAB in the last 72 hours.  Invalid input(s): FREET3  Other results:   Imaging    DG Chest Port 1 View  Result Date: 11/15/2019 CLINICAL DATA:  Postop CABG. EXAM: PORTABLE CHEST 1 VIEW COMPARISON:  11/15/2019 and older studies. FINDINGS: Linear and hazy lung opacities are noted in the mid and lower lungs, greater on the left, consistent with atelectasis  associated with small effusions. This appears stable from the previous day's study, allowing for differences in patient positioning, lung volumes and technique. No pneumothorax.  No mediastinal widening. Mediastinal tube, right internal jugular introducer sheath and right-sided PICC are stable. IMPRESSION: 1. No significant change from the previous day's study. 2. Pleural effusions and mid and lower lung atelectasis, without overt pulmonary edema. 3. No pneumothorax or mediastinal widening. Electronically Signed   By: Lajean Manes M.D.   On: 11/15/2019 09:40     Medications:     Scheduled Medications: . acetaminophen  1,000 mg Oral Q6H   Or  . acetaminophen (TYLENOL) oral liquid 160 mg/5 mL  1,000 mg Per Tube Q6H  . aspirin EC  325 mg Oral Daily   Or  . aspirin  324 mg Per Tube Daily  . bisacodyl  10 mg Oral Daily   Or  . bisacodyl  10 mg Rectal Daily  . digoxin  0.125 mg Oral Daily  . docusate sodium  200 mg Oral Daily  . feeding supplement (ENSURE ENLIVE)  237 mL Oral TID WC  . insulin aspart  0-15 Units Subcutaneous TID WC  . mouth rinse  15 mL Mouth Rinse BID  . pantoprazole  40 mg Oral Daily  . sacubitril-valsartan  1 tablet Oral BID  . sodium chloride flush  10-40 mL Intracatheter Q12H  . sodium chloride flush  3 mL Intravenous Q12H  . spironolactone  12.5 mg Oral Daily    Infusions: . sodium chloride Stopped (11/13/19 0924)  . sodium chloride    . sodium chloride 10 mL/hr at 11/11/19 2047  . amiodarone 30 mg/hr (11/16/19 0900)  . furosemide (LASIX) infusion 10 mg/hr (11/16/19 0900)  . lactated ringers 10 mL/hr at 11/16/19 0900  . milrinone 0.25 mcg/kg/min (11/16/19 0900)    PRN Medications: sodium chloride, levalbuterol, LORazepam, morphine injection, ondansetron (ZOFRAN) IV, oxyCODONE, pneumococcal 23 valent vaccine, sodium chloride flush, sodium chloride flush, traMADol    Assessment/Plan   1. CAD: H/o PCI to LCX in 2017. LHC 11/05/19 with multivessel CAD,  including calcified 80% distal LM stenosis, small diffusely diseased distal LAD, chronically occluded OM1, and 50% mid RCA lesion.  He is now s/p LIMA-LAD, SVG-OM1, SVG-OM2 and SVG-PDA. No s/s ischmia - Intolerance of statins, he  is on Praluent at home. - Continue ASA.  2. Acute systolic CHF: Ischemic cardiomyopathy.  Echo pre-op with EF 25-30%.  Impella 5.5 removed 9/17.  Now off norepinephrine and on milrinone at 0.25 with co-ox 64%.  Diuresing well on lasix gtt. CVP 12. Weight still up 8-10 pounds - Continue lasix gtt and milrinone 0.25 for now. Likely begin milrinone wean tomorrow after full diuresis  - Continue spironolactone 12.5 mg daily. - Continue Entresto 24/26 bid.  - If EF remains low after CABG, will be CRT-D candidate with LBBB.  - Coninue AV pacing today 3. PVCs: Frequent.  Now on amiodarone.  4. Paroxysmal atrial fibrillation: Noted post-op.  Now in NSR.  - Continue amiodarone gtt until off milrinone.    - No anticoagulation yet, will need if atrial fibrillation recurs.    Length of Stay: 11  Glori Bickers, MD  11/16/2019, 9:22 AM  Advanced Heart Failure Team Pager [phone] (M-F; 7a - 4p)  Please contact [facility] for night-coverage after hours (4p -7a ) and weekends on [email or web address]

## 2019-11-17 ENCOUNTER — Inpatient Hospital Stay (HOSPITAL_COMMUNITY): Payer: PPO

## 2019-11-17 LAB — COMPREHENSIVE METABOLIC PANEL
ALT: 20 U/L (ref 0–44)
AST: 15 U/L (ref 15–41)
Albumin: 3.1 g/dL — ABNORMAL LOW (ref 3.5–5.0)
Alkaline Phosphatase: 55 U/L (ref 38–126)
Anion gap: 11 (ref 5–15)
BUN: 26 mg/dL — ABNORMAL HIGH (ref 8–23)
CO2: 35 mmol/L — ABNORMAL HIGH (ref 22–32)
Calcium: 8.8 mg/dL — ABNORMAL LOW (ref 8.9–10.3)
Chloride: 90 mmol/L — ABNORMAL LOW (ref 98–111)
Creatinine, Ser: 1.16 mg/dL (ref 0.61–1.24)
GFR calc Af Amer: >60 mL/min (ref >60)
GFR calc non Af Amer: >60 mL/min (ref >60)
Glucose, Bld: 127 mg/dL — ABNORMAL HIGH (ref 70–99)
Potassium: 3.6 mmol/L (ref 3.5–5.1)
Sodium: 136 mmol/L (ref 135–145)
Total Bilirubin: 0.9 mg/dL (ref 0.3–1.2)
Total Protein: 6.4 g/dL — ABNORMAL LOW (ref 6.5–8.1)

## 2019-11-17 LAB — CBC
HCT: 31.8 % — ABNORMAL LOW (ref 39.0–52.0)
Hemoglobin: 10.4 g/dL — ABNORMAL LOW (ref 13.0–17.0)
MCH: 30.8 pg (ref 26.0–34.0)
MCHC: 32.7 g/dL (ref 30.0–36.0)
MCV: 94.1 fL (ref 80.0–100.0)
Platelets: 242 10*3/uL (ref 150–400)
RBC: 3.38 MIL/uL — ABNORMAL LOW (ref 4.22–5.81)
RDW: 13.5 % (ref 11.5–15.5)
WBC: 9.4 10*3/uL (ref 4.0–10.5)
nRBC: 0.7 % — ABNORMAL HIGH (ref 0.0–0.2)

## 2019-11-17 LAB — COOXEMETRY PANEL
Carboxyhemoglobin: 1.4 % (ref 0.5–1.5)
Carboxyhemoglobin: 1.3 % (ref 0.5–1.5)
Methemoglobin: 1.3 % (ref 0.0–1.5)
Methemoglobin: 1.3 % (ref 0.0–1.5)
O2 Saturation: 51.2 %
O2 Saturation: 48.3 %
Total hemoglobin: 10.6 g/dL — ABNORMAL LOW (ref 12.0–16.0)
Total hemoglobin: 10.9 g/dL — ABNORMAL LOW (ref 12.0–16.0)

## 2019-11-17 LAB — GLUCOSE, CAPILLARY
Glucose-Capillary: 133 mg/dL — ABNORMAL HIGH (ref 70–99)
Glucose-Capillary: 90 mg/dL (ref 70–99)
Glucose-Capillary: 99 mg/dL (ref 70–99)

## 2019-11-17 MED ORDER — ENOXAPARIN SODIUM 30 MG/0.3ML ~~LOC~~ SOLN
30.0000 mg | SUBCUTANEOUS | Status: DC
  Administered 2019-11-17 – 2019-11-18 (×2): 30 mg via SUBCUTANEOUS
  Filled 2019-11-17 (×2): qty 0.3

## 2019-11-17 MED ORDER — AMIODARONE HCL IN DEXTROSE 360-4.14 MG/200ML-% IV SOLN: 30.0000 mg/h | INTRAVENOUS | Status: DC

## 2019-11-17 MED ORDER — NOREPINEPHRINE 4 MG/250ML-% IV SOLN
0.0000 ug/min | INTRAVENOUS | Status: DC
  Administered 2019-11-17: 3 ug/min via INTRAVENOUS

## 2019-11-17 MED ORDER — POTASSIUM CHLORIDE CRYS ER 20 MEQ PO TBCR
20.0000 meq | EXTENDED_RELEASE_TABLET | ORAL | Status: AC
  Administered 2019-11-17 (×3): 20 meq via ORAL
  Filled 2019-11-17 (×3): qty 1

## 2019-11-17 MED ORDER — AMIODARONE IV BOLUS ONLY 150 MG/100ML
150.0000 mg | Freq: Once | INTRAVENOUS | Status: AC
  Administered 2019-11-17: 150 mg via INTRAVENOUS
  Filled 2019-11-17: qty 100

## 2019-11-17 MED ORDER — CLONAZEPAM 0.25 MG PO TBDP
0.2500 mg | ORAL_TABLET | Freq: Every day | ORAL | Status: DC
  Administered 2019-11-17 – 2019-11-21 (×5): 0.25 mg via ORAL
  Filled 2019-11-17 (×5): qty 1

## 2019-11-17 MED ORDER — NOREPINEPHRINE 4 MG/250ML-% IV SOLN
INTRAVENOUS | Status: AC
  Administered 2019-11-17: 3 ug/min via INTRAVENOUS
  Filled 2019-11-17: qty 250

## 2019-11-17 MED ORDER — AMIODARONE LOAD VIA INFUSION
150.0000 mg | Freq: Once | INTRAVENOUS | Status: AC
  Administered 2019-11-17: 150 mg via INTRAVENOUS
  Filled 2019-11-17: qty 83.34

## 2019-11-17 MED ORDER — AMIODARONE HCL IN DEXTROSE 360-4.14 MG/200ML-% IV SOLN
60.0000 mg/h | INTRAVENOUS | Status: DC
  Administered 2019-11-17: 60 mg/h via INTRAVENOUS

## 2019-11-17 NOTE — Plan of Care (Signed)
°  Problem: Education: Goal: Ability to demonstrate management of disease process will improve Outcome: Progressing Goal: Ability to verbalize understanding of medication therapies will improve Outcome: Progressing Goal: Individualized Educational Video(s) Outcome: Progressing   Problem: Activity: Goal: Capacity to carry out activities will improve Outcome: Progressing   Problem: Cardiac: Goal: Ability to achieve and maintain adequate cardiopulmonary perfusion will improve Outcome: Progressing   Problem: Education: Goal: Knowledge of General Education information will improve Description: Including pain rating scale, medication(s)/side effects and non-pharmacologic comfort measures Outcome: Progressing   Problem: Health Behavior/Discharge Planning: Goal: Ability to manage health-related needs will improve Outcome: Progressing   Problem: Clinical Measurements: Goal: Ability to maintain clinical measurements within normal limits will improve Outcome: Progressing Goal: Will remain free from infection Outcome: Progressing Goal: Diagnostic test results will improve Outcome: Progressing Goal: Respiratory complications will improve Outcome: Progressing Goal: Cardiovascular complication will be avoided Outcome: Progressing   Problem: Activity: Goal: Risk for activity intolerance will decrease Outcome: Progressing   Problem: Nutrition: Goal: Adequate nutrition will be maintained Outcome: Progressing   Problem: Coping: Goal: Level of anxiety will decrease Outcome: Progressing   Problem: Elimination: Goal: Will not experience complications related to bowel motility Outcome: Progressing Goal: Will not experience complications related to urinary retention Outcome: Progressing   Problem: Pain Managment: Goal: General experience of comfort will improve Outcome: Progressing   Problem: Safety: Goal: Ability to remain free from injury will improve Outcome: Progressing    Problem: Skin Integrity: Goal: Risk for impaired skin integrity will decrease Outcome: Progressing   Problem: Education: Goal: Will demonstrate proper wound care and an understanding of methods to prevent future damage Outcome: Progressing Goal: Knowledge of disease or condition will improve Outcome: Progressing Goal: Knowledge of the prescribed therapeutic regimen will improve Outcome: Progressing Goal: Individualized Educational Video(s) Outcome: Progressing   Problem: Activity: Goal: Risk for activity intolerance will decrease Outcome: Progressing   Problem: Cardiac: Goal: Will achieve and/or maintain hemodynamic stability Outcome: Progressing   Problem: Clinical Measurements: Goal: Postoperative complications will be avoided or minimized Outcome: Progressing   Problem: Respiratory: Goal: Respiratory status will improve Outcome: Progressing   Problem: Skin Integrity: Goal: Wound healing without signs and symptoms of infection Outcome: Progressing Goal: Risk for impaired skin integrity will decrease Outcome: Progressing   Problem: Urinary Elimination: Goal: Ability to achieve and maintain adequate renal perfusion and functioning will improve Outcome: Progressing   

## 2019-11-17 NOTE — Progress Notes (Signed)
Patient ID: [name], male   DOB: [birth year], 69 y.o.   MRN: [id number]     Advanced Heart Failure Rounding Note  PCP-Cardiologist: Nelva Bush, MD   Subjective:    S/P CABG with direct Impella placed with difficulty weaning from bypass. Impella removed in OR 9/17  On milrinone 0.25. Lasix gtt at 10  And amio at 30. Eight down 3 pounds (still up 8 pounds from baseline). CVP 9  Developed AF with RVR overnight. Started on amio gtt. Co-ox 51% MAPs 60s  Very frustrated this am about all alarms and inability to sleep.   Objective:   Weight Range: 82 kg Body mass index is 29.18 kg/m.   Vital Signs:   Temp:  [96.9 F (36.1 C)-98.4 F (36.9 C)] 98.1 F (36.7 C) (09/19 0740) Pulse Rate:  [43-128] 95 (09/19 0400) Resp:  [9-22] 18 (09/19 0700) BP: (70-108)/(44-91) 107/68 (09/19 0700) SpO2:  [64 %-100 %] 98 % (09/19 0700) Arterial Line BP: (116-119)/(55-56) 116/56 (09/18 1000) Weight:  [82 kg] 82 kg (09/19 0454) Last BM Date: 11/13/19  Weight change: Filed Weights   11/15/19 0500 11/16/19 0500 11/17/19 0454  Weight: 85.4 kg 83.3 kg 82 kg    Intake/Output:   Intake/Output Summary (Last 24 hours) at 11/17/2019 0834 Last data filed at 11/17/2019 0700 Gross per 24 hour  Intake 1813.9 ml  Output 3190 ml  Net -1376.1 ml      Physical Exam   General:  Sitting in chair. Uncomfortable HEENT: normal Neck: supple. JVP 9  Impella wound Cor: PMI nondisplaced. Irregular tachy  Lungs: clear Abdomen: soft, nontender, nondistended. No hepatosplenomegaly. No bruits or masses. Good bowel sounds. Extremities: no cyanosis, clubbing, rash, 1+ edema Neuro: alert & orientedx3, cranial nerves grossly intact. moves all 4 extremities w/o difficulty. Affect stressed    Telemetry   AFL with RVR  Personally reviewed  Labs    CBC Recent Labs    11/16/19 0449 11/17/19 0443  WBC 12.1* 9.4  HGB 8.6* 10.4*  HCT 26.7* 31.8*  MCV 93.4 94.1  PLT 182 858   Basic Metabolic  Panel Recent Labs    11/16/19 0449 11/17/19 0443  NA 137 136  K 4.1 3.6  CL 96* 90*  CO2 32 35*  GLUCOSE 163* 127*  BUN 19 26*  CREATININE 0.92 1.16  CALCIUM 8.5* 8.8*  MG 2.0  --    Liver Function Tests Recent Labs    11/16/19 0449 11/17/19 0443  AST 15 15  ALT 19 20  ALKPHOS 52 55  BILITOT 0.5 0.9  PROT 6.0* 6.4*  ALBUMIN 2.9* 3.1*   No results for input(s): LIPASE, AMYLASE in the last 72 hours. Cardiac Enzymes No results for input(s): CKTOTAL, CKMB, CKMBINDEX, TROPONINI in the last 72 hours.  BNP: BNP (last 3 results) Recent Labs    11/05/19 2228  BNP 468.7*    ProBNP (last 3 results) No results for input(s): PROBNP in the last 8760 hours.   D-Dimer No results for input(s): DDIMER in the last 72 hours. Hemoglobin A1C No results for input(s): HGBA1C in the last 72 hours. Fasting Lipid Panel No results for input(s): CHOL, HDL, LDLCALC, TRIG, CHOLHDL, LDLDIRECT in the last 72 hours. Thyroid Function Tests No results for input(s): TSH, T4TOTAL, T3FREE, THYROIDAB in the last 72 hours.  Invalid input(s): FREET3  Other results:   Imaging    DG Chest Port 1 View  Result Date: 11/17/2019 CLINICAL DATA:  Follow-up atelectasis. EXAM: PORTABLE CHEST 1 VIEW  COMPARISON:  11/16/2019 FINDINGS: Right arm PICC line tip is at the cavoatrial junction. Interval removal of right IJ catheter and mediastinal drain. Previous median sternotomy and CABG procedure. Heart size appears enlarged. Decreased lung volumes. Small pleural effusions are again noted and appear unchanged. Subsegmental atelectasis identified within the left midlung. IMPRESSION: Stable small bilateral pleural effusions and left midlung atelectasis. Electronically Signed   By: Kerby Moors M.D.   On: 11/17/2019 08:07     Medications:     Scheduled Medications:  aspirin EC  325 mg Oral Daily   bisacodyl  10 mg Oral Daily   Or   bisacodyl  10 mg Rectal Daily   digoxin  0.125 mg Oral Daily    docusate sodium  200 mg Oral Daily   feeding supplement (ENSURE ENLIVE)  237 mL Oral TID WC   insulin aspart  0-15 Units Subcutaneous TID WC   mouth rinse  15 mL Mouth Rinse BID   pantoprazole  40 mg Oral Daily   potassium chloride  20 mEq Oral Q4H   sacubitril-valsartan  1 tablet Oral BID   sodium chloride flush  10-40 mL Intracatheter Q12H   sodium chloride flush  3 mL Intravenous Q12H   spironolactone  12.5 mg Oral Daily    Infusions:  sodium chloride     amiodarone 30 mg/hr (11/17/19 0700)   furosemide (LASIX) infusion 10 mg/hr (11/17/19 0700)   milrinone 0.25 mcg/kg/min (11/17/19 0700)    PRN Medications: levalbuterol, LORazepam, morphine injection, ondansetron (ZOFRAN) IV, oxyCODONE, pneumococcal 23 valent vaccine, sodium chloride flush, sodium chloride flush, traMADol, traZODone    Assessment/Plan   1. CAD: H/o PCI to LCX in 2017. LHC 11/05/19 with multivessel CAD, including calcified 80% distal LM stenosis, small diffusely diseased distal LAD, chronically occluded OM1, and 50% mid RCA lesion.  He is now s/p LIMA-LAD, SVG-OM1, SVG-OM2 and SVG-PDA. No ischemia.  - Intolerance of statins, he is on Praluent at home. - Continue ASA.  2. Acute systolic CHF: Ischemic cardiomyopathy.  Echo pre-op with EF 25-30%.  Impella 5.5 removed 9/17.  Now on milrinone at 0.25 with co-ox 64%-> 51% in setting of AFL with RVR. BP soft.Diuresing well on lasix gtt. CVP 9. Weight still up 8-10 pounds.  - Will repeat co-ox. Hold lasix. Start NE as needed to keep co-ox > 55%. Map > 65 - Continue spironolactone 12.5 mg daily for now. Can stop as needed - Hold Entresto with low BP  - If EF remains low after CABG, will be CRT-D candidate with LBBB.  3. PVCs: Frequent.  Now on amiodarone.  - no change 4. Paroxysmal atrial fibrillationflutter: Back in AFL with RVR. Remains on IV amio. Rate remains fast. BP and co-ox low. Will rebolus amio. Increase rate to 60. I tried to rapid atrially pace him  but was unsuccessful - Bolus amio and increase to 60. If fails to convert can do DC-CV. Keep NPO for now. D/w Dr. Roxy Manns at bedside.  - Will likely need [facility]. Will d/w TCTS. -> use lovenox 30 for now 5. Hypokalemia - supp    CRITICAL CARE Performed by: Glori Bickers  Total critical care time: 45 minutes  Critical care time was exclusive of separately billable procedures and treating other patients.  Critical care was necessary to treat or prevent imminent or life-threatening deterioration.  Critical care was time spent personally by me (independent of midlevel providers or residents) on the following activities: development of treatment plan with patient and/or surrogate as well  as nursing, discussions with consultants, evaluation of patient's response to treatment, examination of patient, obtaining history from patient or surrogate, ordering and performing treatments and interventions, ordering and review of laboratory studies, ordering and review of radiographic studies, pulse oximetry and re-evaluation of patient's condition.   Length of Stay: [place], MD  11/17/2019, 8:34 AM  Advanced Heart Failure Team Pager [phone] (M-F; 7a - 4p)  Please contact [facility] for night-coverage after hours (4p -7a ) and weekends on [email or web address]

## 2019-11-17 NOTE — Progress Notes (Signed)
Went into patient's room because patient called out and IV pump beeping occluded patient side. Patient states "y'all need more staff or something." Patient unable to sleep overnight due to ECG alarms. RN has explained to patient that he is in an irregular heart rhythm called Afib and is receiving continuous IV medication to treat this and that he received a bolus of this medication earlier in shift per Dr. Guy Sandifer order. Explained to patient that he is not symptomatic from being in Afib and that some of the ECG alarms can not be turned off. Patient expressed frustration.

## 2019-11-17 NOTE — Progress Notes (Addendum)
      [address]       [place] [address]             [phone]        CARDIOTHORACIC SURGERY PROGRESS NOTE   R2 Days Post-Op Procedure(s) (LRB): REMOVAL OF IMPELLA 5.5 LEFT VENTRICULAR ASSIST DEVICE (N/A) TRANSESOPHAGEAL ECHOCARDIOGRAM (TEE) (N/A)  Subjective: Anxious.  Went into Afib w/ RVR overnight.  Denies pain, SOB  Objective: Vital signs: BP Readings from Last 1 Encounters:  11/17/19 107/68   Pulse Readings from Last 1 Encounters:  11/17/19 95   Resp Readings from Last 1 Encounters:  11/17/19 18   Temp Readings from Last 1 Encounters:  11/17/19 98.1 F (36.7 C)    Hemodynamics:   Mixed venous co-ox 51%   Physical Exam:  Rhythm:   Afib w/ HR 120  Breath sounds: clear  Heart sounds:  irregular  Incisions:  Dressings dry, intact  Abdomen:  Soft, non-distended, non-tender  Extremities:  Warm, well-perfused    Intake/Output from previous day: 09/18 0701 - 09/19 0700 In: 2096.4 [P.O.:1200; I.V.:896.4] Out: 3190 [Urine:3180; Chest Tube:10] Intake/Output this shift: No intake/output data recorded.  Lab Results:  CBC: Recent Labs    11/16/19 0449 11/17/19 0443  WBC 12.1* 9.4  HGB 8.6* 10.4*  HCT 26.7* 31.8*  PLT 182 242    BMET:  Recent Labs    11/16/19 0449 11/17/19 0443  NA 137 136  K 4.1 3.6  CL 96* 90*  CO2 32 35*  GLUCOSE 163* 127*  BUN 19 26*  CREATININE 0.92 1.16  CALCIUM 8.5* 8.8*     PT/INR:  No results for input(s): LABPROT, INR in the last 72 hours.  CBG (last 3)  Recent Labs    11/16/19 0820 11/16/19 1210 11/16/19 1603  GLUCAP 168* 163* 122*    ABG    Component Value Date/Time   PHART 7.467 (H) 11/16/2019 0440   PCO2ART 49.0 (H) 11/16/2019 0440   PO2ART 66 (L) 11/16/2019 0440   HCO3 35.7 (H) 11/16/2019 0440   TCO2 37 (H) 11/16/2019 0440   ACIDBASEDEF 4.0 (H) 11/11/2019 2301   O2SAT 51.2 11/17/2019 0443    CXR: PORTABLE CHEST 1 VIEW  COMPARISON:  11/16/2019  FINDINGS: Right arm  PICC line tip is at the cavoatrial junction. Interval removal of right IJ catheter and mediastinal drain. Previous median sternotomy and CABG procedure. Heart size appears enlarged. Decreased lung volumes. Small pleural effusions are again noted and appear unchanged. Subsegmental atelectasis identified within the left midlung.  IMPRESSION: Stable small bilateral pleural effusions and left midlung atelectasis.   Electronically Signed   By: Kerby Moors M.D.   On: 11/17/2019 08:07  Assessment/Plan: S/P Procedure(s) (LRB): REMOVAL OF IMPELLA 5.5 LEFT VENTRICULAR ASSIST DEVICE (N/A) TRANSESOPHAGEAL ECHOCARDIOGRAM (TEE) (N/A)  Recurrent Afib w/ increased HR, BP marginal but stable, mixed venous co-ox down 51%, milrinone @ 0.25 Breathing comfortably w/ O2 sats 96-98% on 3 L/min, CXR looks good Acute on chronic systolic CHF with expected post-op volume excess, diuresing well, I/O's negative 6 liters last 48 hours, BUN/Creatinine up slightly 26/1.16 Expected post op acute blood loss anemia, Hgb up with diuresis   Agree w/ additional boluses amiodarone  Supplement potassium  Consider DCCV  Consider stopping epicardial pacing  Continue milrinone  Low dose lovenox for DVT prophylaxis  Keep in ICU   Rexene Alberts, MD 11/17/2019 8:51 AM

## 2019-11-17 NOTE — Progress Notes (Signed)
Inpatient Rehab Admissions Coordinator:   I spoke with Pt. And wife regarding need for rehab following acute stay. They state that they are not interested in CIR at this time because they live in [place] and Pt. Wishes to go home and receive either [facility] or outpatient PT/OT. Pt. Is adamant that he is not interested in inpatient care once cardiac issues are managed. CIR will sign off. I left a brochure and card and encouraged Pt. And wife to contact me if they change their mind. CIR to sign off for now.   Clemens Catholic, [place], [place] Admissions Coordinator  [phone] ([place]) [phone] (office)

## 2019-11-18 LAB — TYPE AND SCREEN
ABO/RH(D): B POS
Antibody Screen: NEGATIVE
Unit division: 0
Unit division: 0
Unit division: 0
Unit division: 0

## 2019-11-18 LAB — BPAM RBC
Blood Product Expiration Date: 202110152359
Blood Product Expiration Date: 202110152359
Blood Product Expiration Date: 202110152359
Blood Product Expiration Date: 202110152359
ISSUE DATE / TIME: 202109161128
ISSUE DATE / TIME: 202109170817
ISSUE DATE / TIME: 202109170817
ISSUE DATE / TIME: 202109170817
Unit Type and Rh: 7300
Unit Type and Rh: 7300
Unit Type and Rh: 7300
Unit Type and Rh: 7300

## 2019-11-18 LAB — POCT I-STAT, CHEM 8
BUN: 22 mg/dL (ref 8–23)
Calcium, Ion: 1.1 mmol/L — ABNORMAL LOW (ref 1.15–1.40)
Chloride: 92 mmol/L — ABNORMAL LOW (ref 98–111)
Creatinine, Ser: 0.9 mg/dL (ref 0.61–1.24)
Glucose, Bld: 129 mg/dL — ABNORMAL HIGH (ref 70–99)
HCT: 28 % — ABNORMAL LOW (ref 39.0–52.0)
Hemoglobin: 9.5 g/dL — ABNORMAL LOW (ref 13.0–17.0)
Potassium: 3.8 mmol/L (ref 3.5–5.1)
Sodium: 133 mmol/L — ABNORMAL LOW (ref 135–145)
TCO2: 29 mmol/L (ref 22–32)

## 2019-11-18 LAB — COMPREHENSIVE METABOLIC PANEL
ALT: 16 U/L (ref 0–44)
AST: 14 U/L — ABNORMAL LOW (ref 15–41)
Albumin: 2.6 g/dL — ABNORMAL LOW (ref 3.5–5.0)
Alkaline Phosphatase: 50 U/L (ref 38–126)
Anion gap: 12 (ref 5–15)
BUN: 20 mg/dL (ref 8–23)
CO2: 30 mmol/L (ref 22–32)
Calcium: 8.2 mg/dL — ABNORMAL LOW (ref 8.9–10.3)
Chloride: 89 mmol/L — ABNORMAL LOW (ref 98–111)
Creatinine, Ser: 1.07 mg/dL (ref 0.61–1.24)
GFR calc Af Amer: >60 mL/min (ref >60)
GFR calc non Af Amer: >60 mL/min (ref >60)
Glucose, Bld: 429 mg/dL — ABNORMAL HIGH (ref 70–99)
Potassium: 3.9 mmol/L (ref 3.5–5.1)
Sodium: 131 mmol/L — ABNORMAL LOW (ref 135–145)
Total Bilirubin: 0.8 mg/dL (ref 0.3–1.2)
Total Protein: 5.7 g/dL — ABNORMAL LOW (ref 6.5–8.1)

## 2019-11-18 LAB — POCT I-STAT 7, (LYTES, BLD GAS, ICA,H+H)
Acid-Base Excess: 4 mmol/L — ABNORMAL HIGH (ref 0.0–2.0)
Bicarbonate: 31 mmol/L — ABNORMAL HIGH (ref 20.0–28.0)
Calcium, Ion: 1.16 mmol/L (ref 1.15–1.40)
HCT: 26 % — ABNORMAL LOW (ref 39.0–52.0)
Hemoglobin: 8.8 g/dL — ABNORMAL LOW (ref 13.0–17.0)
O2 Saturation: 92 %
Potassium: 3.9 mmol/L (ref 3.5–5.1)
Sodium: 134 mmol/L — ABNORMAL LOW (ref 135–145)
TCO2: 33 mmol/L — ABNORMAL HIGH (ref 22–32)
pCO2 arterial: 58.5 mmHg — ABNORMAL HIGH (ref 32.0–48.0)
pH, Arterial: 7.333 — ABNORMAL LOW (ref 7.350–7.450)
pO2, Arterial: 70 mmHg — ABNORMAL LOW (ref 83.0–108.0)

## 2019-11-18 LAB — GLUCOSE, CAPILLARY
Glucose-Capillary: 106 mg/dL — ABNORMAL HIGH (ref 70–99)
Glucose-Capillary: 112 mg/dL — ABNORMAL HIGH (ref 70–99)
Glucose-Capillary: 113 mg/dL — ABNORMAL HIGH (ref 70–99)
Glucose-Capillary: 120 mg/dL — ABNORMAL HIGH (ref 70–99)
Glucose-Capillary: 122 mg/dL — ABNORMAL HIGH (ref 70–99)
Glucose-Capillary: 125 mg/dL — ABNORMAL HIGH (ref 70–99)

## 2019-11-18 LAB — COOXEMETRY PANEL
Carboxyhemoglobin: 1.1 % (ref 0.5–1.5)
Methemoglobin: 0.9 % (ref 0.0–1.5)
O2 Saturation: 64.9 %
Total hemoglobin: 9.3 g/dL — ABNORMAL LOW (ref 12.0–16.0)

## 2019-11-18 MED ORDER — POTASSIUM CHLORIDE CRYS ER 20 MEQ PO TBCR
20.0000 meq | EXTENDED_RELEASE_TABLET | Freq: Two times a day (BID) | ORAL | Status: DC
  Administered 2019-11-18 – 2019-11-21 (×7): 20 meq via ORAL
  Filled 2019-11-18 (×7): qty 1

## 2019-11-18 MED ORDER — SPIRONOLACTONE 25 MG PO TABS
25.0000 mg | ORAL_TABLET | Freq: Every day | ORAL | Status: DC
  Administered 2019-11-18 – 2019-11-21 (×4): 25 mg via ORAL
  Filled 2019-11-18 (×4): qty 1

## 2019-11-18 MED ORDER — MELOXICAM 7.5 MG PO TABS
7.5000 mg | ORAL_TABLET | Freq: Once | ORAL | Status: AC
  Administered 2019-11-18: 7.5 mg via ORAL
  Filled 2019-11-18: qty 1

## 2019-11-18 MED ORDER — POTASSIUM CHLORIDE 10 MEQ/50ML IV SOLN
10.0000 meq | INTRAVENOUS | Status: AC
  Administered 2019-11-18 (×2): 10 meq via INTRAVENOUS
  Filled 2019-11-18 (×2): qty 50

## 2019-11-18 MED ORDER — FUROSEMIDE 40 MG PO TABS
40.0000 mg | ORAL_TABLET | Freq: Every day | ORAL | Status: DC
  Administered 2019-11-18 – 2019-11-21 (×4): 40 mg via ORAL
  Filled 2019-11-18 (×4): qty 1

## 2019-11-18 MED ORDER — COLCHICINE 0.6 MG PO TABS
0.6000 mg | ORAL_TABLET | Freq: Two times a day (BID) | ORAL | Status: DC
  Administered 2019-11-18 – 2019-11-21 (×7): 0.6 mg via ORAL
  Filled 2019-11-18 (×7): qty 1

## 2019-11-18 MED ORDER — FUROSEMIDE 40 MG PO TABS: 40.0000 mg | ORAL_TABLET | Freq: Every day | ORAL | Status: DC

## 2019-11-18 NOTE — Progress Notes (Signed)
Physical Therapy Treatment Patient Details Name: [name] MRN: [id number] DOB: 1951/02/24 Today's Date: 11/18/2019    History of Present Illness Pt admitted after cath with CAD and ICM. Pt s/p high risk CABG with impella 5.5 placement 9/13-9/17. PMhx: CAD, NSTEMI, LBBB, NICM, PVD s/p mechanical thrombectomy, HTN, HLD, hiatal hernia    PT Comments    Pt with excellent improvement in mobility since Impella removal. Pt able to walk most of unit today but limited by Right hallux pain with pt questioning gout stating he has had this pain before but never diagnosed. Pt able to recall part of his precautions with education and handout provided. Educated for HEP and walking program with return home very reasonable at this point.   Paced HR 79-81 Pre 96/60 (72) Post gait 96/46 (62)    Follow Up Recommendations  Home health PT;Supervision for mobility/OOB     Equipment Recommendations  Rolling walker with 5" wheels    Recommendations for Other Services       Precautions / Restrictions Precautions Precautions: Sternal;Fall Precaution Booklet Issued: Yes (comment) Precaution Comments: external pacer    Mobility  Bed Mobility Overal bed mobility: Needs Assistance Bed Mobility: Rolling;Sidelying to Sit Rolling: Supervision Sidelying to sit: Supervision       General bed mobility comments: cues for sequence/precautions and increased time  Transfers Overall transfer level: Needs assistance   Transfers: Sit to/from Stand Sit to Stand: Min guard         General transfer comment: cues for hand placement and backing fully to surface  Ambulation/Gait Ambulation/Gait assistance: Min guard Gait Distance (Feet): 300 Feet Assistive device: Rolling walker (2 wheeled) Gait Pattern/deviations: Step-through pattern;Decreased stride length   Gait velocity interpretation: >2.62 ft/sec, indicative of community ambulatory General Gait Details: cues for posture and direction,  distance limited by Rt toe pain.   Stairs             Wheelchair Mobility    Modified Rankin (Stroke Patients Only)       Balance Overall balance assessment: No apparent balance deficits (not formally assessed)                                          Cognition Arousal/Alertness: Awake/alert Behavior During Therapy: WFL for tasks assessed/performed Overall Cognitive Status: Impaired/Different from baseline                       Memory: Decreased recall of precautions         General Comments: pt able to recall 2 precuations with education for all and handout provided      Exercises General Exercises - Lower Extremity Shrieves Arc Quad: AROM;Both;20 reps;Seated Hip Flexion/Marching: AROM;Both;20 reps;Seated    General Comments        Pertinent Vitals/Pain Pain Score: 4  Faces Pain Scale: Hurts little more Pain Location: chest, pt also reports 10/ 10 Right hallux pain Pain Descriptors / Indicators: Sore;Aching Pain Intervention(s): Monitored during session;Repositioned    Home Living                      Prior Function            PT Goals (current goals can now be found in the care plan section) Progress towards PT goals: Progressing toward goals    Frequency    Min 3X/week  PT Plan Discharge plan needs to be updated    Co-evaluation              AM-PAC PT "6 Clicks" Mobility   Outcome Measure  Help needed turning from your back to your side while in a flat bed without using bedrails?: A Little Help needed moving from lying on your back to sitting on the side of a flat bed without using bedrails?: A Little Help needed moving to and from a bed to a chair (including a wheelchair)?: A Little Help needed standing up from a chair using your arms (e.g., wheelchair or bedside chair)?: A Little Help needed to walk in hospital room?: A Little Help needed climbing 3-5 steps with a railing? : A Little 6  Click Score: 18    End of Session Equipment Utilized During Treatment: Gait belt Activity Tolerance: Patient tolerated treatment well Patient left: in chair;with call bell/phone within reach;with nursing/sitter in room Nurse Communication: Mobility status PT Visit Diagnosis: Other abnormalities of gait and mobility (R26.89);Difficulty in walking, not elsewhere classified (R26.2);Muscle weakness (generalized) (M62.81)     Time: 0920-0950 PT Time Calculation (min) (ACUTE ONLY): 30 min  Charges:  $Gait Training: 8-22 mins $Therapeutic Exercise: 8-22 mins                     [place], PT Acute Rehabilitation Services Pager: [phone] Office: [place] 11/18/2019, 12:29 PM

## 2019-11-18 NOTE — Progress Notes (Addendum)
Patient ID: [name], male   DOB: Apr 02, 1950, 69 y.o.   MRN: [id number]     Advanced Heart Failure Rounding Note  PCP-Cardiologist: Nelva Bush, MD   Subjective:    S/P CABG with direct Impella placed with difficulty weaning from bypass. Impella removed in OR 9/17  On milrinone 0.25. Required NE briefly yesterday for low Co-ox and hypotention. Discontinued overnight. Co-ox 65% this morning.  BP ok, SBPs 110s.   Lasix gtt discontinued yesterday. - 2.8L in UOP yesterday. Wt down an additional 5 lb. Nearing pre-op wt.   Developed AF with RVR over the weekend. Started on amio gtt. Back in NSR this AM, HR 70s.   Only complaint today is being NPO, hungry and thirsty. No CP or dyspnea.     Objective:   Weight Range: 79.6 kg Body mass index is 28.32 kg/m.   Vital Signs:   Temp:  [97.7 F (36.5 C)-98.8 F (37.1 C)] 98.5 F (36.9 C) (09/20 0415) Pulse Rate:  [55-82] 78 (09/20 0700) Resp:  [9-21] 17 (09/20 0700) BP: (81-144)/(53-77) 111/65 (09/20 0700) SpO2:  [87 %-99 %] 97 % (09/20 0700) Weight:  [79.6 kg] 79.6 kg (09/20 0500) Last BM Date: 11/13/19  Weight change: Filed Weights   11/16/19 0500 11/17/19 0454 11/18/19 0500  Weight: 83.3 kg 82 kg 79.6 kg    Intake/Output:   Intake/Output Summary (Last 24 hours) at 11/18/2019 0711 Last data filed at 11/18/2019 0456 Gross per 24 hour  Intake 1356.06 ml  Output 2750 ml  Net -1393.94 ml      Physical Exam   General:  Well appearing, laying in bed. No distress HEENT: normal Neck: supple. No JVD, no bruits Cor: PMI nondisplaced. RRR, no murmurs rubs or gallops, sternotomy site ok Lungs: clear bilaterally, no wheezing  Abdomen: soft, nontender, nondistended. No hepatosplenomegaly. No bruits or masses. Good bowel sounds. Extremities: no cyanosis, clubbing, rash, no edema, + bilateral SCDs Neuro: alert & orientedx3, cranial nerves grossly intact. moves all 4 extremities w/o difficulty. Affect stressed    Telemetry     NSR, 70s LBBB Personally reviewed  Labs    CBC Recent Labs    11/16/19 0449 11/17/19 0443  WBC 12.1* 9.4  HGB 8.6* 10.4*  HCT 26.7* 31.8*  MCV 93.4 94.1  PLT 182 272   Basic Metabolic Panel Recent Labs    11/16/19 0449 11/16/19 0449 11/17/19 0443 11/18/19 0418  NA 137   < > 136 131*  K 4.1   < > 3.6 3.9  CL 96*   < > 90* 89*  CO2 32   < > 35* 30  GLUCOSE 163*   < > 127* 429*  BUN 19   < > 26* 20  CREATININE 0.92   < > 1.16 1.07  CALCIUM 8.5*   < > 8.8* 8.2*  MG 2.0  --   --   --    < > = values in this interval not displayed.   Liver Function Tests Recent Labs    11/17/19 0443 11/18/19 0418  AST 15 14*  ALT 20 16  ALKPHOS 55 50  BILITOT 0.9 0.8  PROT 6.4* 5.7*  ALBUMIN 3.1* 2.6*   No results for input(s): LIPASE, AMYLASE in the last 72 hours. Cardiac Enzymes No results for input(s): CKTOTAL, CKMB, CKMBINDEX, TROPONINI in the last 72 hours.  BNP: BNP (last 3 results) Recent Labs    11/05/19 2228  BNP 468.7*    ProBNP (last 3 results) No results  for input(s): PROBNP in the last 8760 hours.   D-Dimer No results for input(s): DDIMER in the last 72 hours. Hemoglobin A1C No results for input(s): HGBA1C in the last 72 hours. Fasting Lipid Panel No results for input(s): CHOL, HDL, LDLCALC, TRIG, CHOLHDL, LDLDIRECT in the last 72 hours. Thyroid Function Tests No results for input(s): TSH, T4TOTAL, T3FREE, THYROIDAB in the last 72 hours.  Invalid input(s): FREET3  Other results:   Imaging    No results found.   Medications:     Scheduled Medications: . aspirin EC  325 mg Oral Daily  . bisacodyl  10 mg Oral Daily   Or  . bisacodyl  10 mg Rectal Daily  . clonazepam  0.25 mg Oral Daily  . digoxin  0.125 mg Oral Daily  . docusate sodium  200 mg Oral Daily  . enoxaparin (LOVENOX) injection  30 mg Subcutaneous Q24H  . feeding supplement (ENSURE ENLIVE)  237 mL Oral TID WC  . insulin aspart  0-15 Units Subcutaneous TID WC  . mouth  rinse  15 mL Mouth Rinse BID  . pantoprazole  40 mg Oral Daily  . sodium chloride flush  10-40 mL Intracatheter Q12H  . sodium chloride flush  3 mL Intravenous Q12H  . spironolactone  12.5 mg Oral Daily    Infusions: . sodium chloride    . amiodarone 60 mg/hr (11/18/19 0400)  . milrinone 0.25 mcg/kg/min (11/18/19 0400)  . norepinephrine (LEVOPHED) Adult infusion Stopped (11/18/19 0002)    PRN Medications: levalbuterol, LORazepam, morphine injection, ondansetron (ZOFRAN) IV, oxyCODONE, pneumococcal 23 valent vaccine, sodium chloride flush, sodium chloride flush, traMADol, traZODone    Assessment/Plan   1. CAD: H/o PCI to LCX in 2017. LHC 11/05/19 with multivessel CAD, including calcified 80% distal LM stenosis, small diffusely diseased distal LAD, chronically occluded OM1, and 50% mid RCA lesion.  He is now s/p LIMA-LAD, SVG-OM1, SVG-OM2 and SVG-PDA. Stable w/o CP - Intolerance of statins, he is on Praluent at home. - Continue ASA.  2. Acute systolic CHF: Ischemic cardiomyopathy.  Echo pre-op with EF 25-30%.  Impella 5.5 removed 9/17.  Now on milrinone at 0.25. Co-ox 65%. Diuresed well on lasix gtt, now back down to pre-op wt.   - Increase Spiro to 25 mg daily  - Wean down milrinone to 0.125. Follow co-ox - Continue to hold Entresto with soft BP for now, try to re-start once off milrinone   - consider addition of Farxiga  - If EF remains low after CABG, will be CRT-D candidate with LBBB.  3. PVCs: Frequent.  Now on amiodarone.  - no change 4. Paroxysmal atrial fibrillation/flutter: Went back into AFL over the weekend, Amio rebolused and gtt increased to 60. Converted back to NSR. HR well controlled.  - continue amio gtt while on milrinone.  - Will likely need AC. Will d/w TCTS. -> use lovenox 30 for now 5. Hypokalemia - resolved, K 3.9 today  - continue spiro   Length of Stay: [address] PA-C  11/18/2019, 7:11 AM  Advanced Heart Failure Team Pager [phone] (M-F;  [place])  Please contact [facility] for night-coverage after hours (4p -7a ) and weekends on [email or web address]  Patient seen with PA, agree with the above note.   Stable this morning, back in NSR.  Co-ox 65%.  CVP line not working properly.  I/Os negative yesterday, weight trending down.  Creatinine stable 0.9.  He feels better overall.   General: NAD Neck: No JVD, no  thyromegaly or thyroid nodule.  Lungs: Clear to auscultation bilaterally with normal respiratory effort. CV: Nondisplaced PMI.  Heart regular S1/S2, no S3/S4, no murmur.  No peripheral edema.   Abdomen: Soft, nontender, no hepatosplenomegaly, no distention.  Skin: Intact without lesions or rashes.  Neurologic: Alert and oriented x 3.  Psych: Normal affect. Extremities: No clubbing or cyanosis.  HEENT: Normal.   Today, decrease milrinone to 0.125.  SBP in 100s, will increase spironolactone to 25 mg daily and continue digoxin 0.125 daily. Continue Lasix 40 mg po daily today, volume status looks ok.   He is in NSR on IV amiodarone. Continue amiodarone IV until off milrinone.  He will need Basto-term anticoagulation, it appears, with recurrent atrial fibrillation post-op.  Would start Eliquis when ok with TCTS.   Mobilize, out of bed.   CRITICAL CARE Performed by: Loralie Champagne  Total critical care time: 35 minutes  Critical care time was exclusive of separately billable procedures and treating other patients.  Critical care was necessary to treat or prevent imminent or life-threatening deterioration.  Critical care was time spent personally by me on the following activities: development of treatment plan with patient and/or surrogate as well as nursing, discussions with consultants, evaluation of patient's response to treatment, examination of patient, obtaining history from patient or surrogate, ordering and performing treatments and interventions, ordering and review of laboratory studies, ordering and review of  radiographic studies, pulse oximetry and re-evaluation of patient's condition.  Loralie Champagne 11/18/2019 8:13 AM

## 2019-11-18 NOTE — Progress Notes (Signed)
Patient ID: [name], male   DOB: 1950/09/01, 69 y.o.   MRN: [id number] TCTS Evening Rounds:  Hemodynamically stable on milrinone 0.125. CVP 7.  Remains in sinus rhythm on amio.  sats 92% on 3L [place].

## 2019-11-18 NOTE — Hospital Course (Addendum)
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  A stress test was positive.  Cardiac catheterization by Dr. Saunders Revel demonstrated 80% distal left main stenosis with severe three-vessel diabetic pattern of disease.  LVEF is 30% with LVEDP of 30.  He is transferred to [facility] for surgical evaluation. An echocardiogram confirmed left ventricular ejection fraction of 25-30% and no significant aortic or mitral valvular disease.  The patient stabilized with medical therapy and diuresis which was started because of an elevated wedge pressure.  Heart Failure  Cardiology assessed the patient and monitored his mixed venous saturation and titrated his preoperative medications.  Dr. Darcey Nora discussed the procedure of high-risk CABG with the patient, including the potential for needing temporary percutaneous Impella LVAD support.  He understood the benefits to include improved [place]-term survival and improved symptoms and exercise tolerance.  He understood the risks to include stroke, bleeding, infection, organ failure, postoperative pulmonary problems, infection, and death.  He demonstrated his understanding and agreed to  proceed with surgery.   The patient's last surgical procedure was on his left hip and he had no anesthetic or cardiac complications.  He stopped smoking over 10 years ago.  He has been on Stein-term Plavix for the left SFA stent performed 2020   Hospital Course: After Mr. Ose had been medically stabilized and had been given sufficient time for Plavix washout, he was taken to the OR where CABG x 4 and placement of an Impella 5.5 LV assist device were carried out. He had a significant coagulopathy following the procedure which was corrected with transfusions on platelets and fresh frozen plasma. He  remained sedated and hemodynamically stable on epinephrine and milrinone infusions and was also started on heparin infusion for the LV assist device.   He was weaned from mechanical vent support slowly and was extubated on the morning of the first post-operative day. The advanced heart failure cardiology team followed the patient closely post operatively. He was transfused with additional PRBC's on post-op day 1 for Hct of 23%. The inotropic support and mechanical left ventricular support were slowly weaned over the course of several days and this was well tolerated. The PA catheter was removed on POD 2 and he was mobilized with assistance from physical therapy. He developed atrial fibrillation with RVR on the evening of POD2 and was started on amiodarone. He converted back to SR by the following day. His hemodynamics stabilized sufficiently to allow removal of the Impella LVAD on post-op day 4.  This was acomplished in the OR without complication and he was returned to the ICU. He was then diuresed aggressively with a Lasix drip. He was also started on Entresto by the heart failure team. His hemodynamics gradually improved. He was weaned off of the milrinone infusion by 11/19/19 while monitoring the CoOx daily. The pacer wires were removed on 9/21 and he was started on apixaban on the following day. The amiodarone infusion was converted to oral amiodarone on 9/22.      OPERATIVE REPORT   DATE OF PROCEDURE:  11/11/2019   OPERATION: 1.  Coronary artery bypass grafting x4 (left internal mammary artery to diagonal, saphenous vein graft to obtuse marginal 1, saphenous vein graft to obtuse marginal 2, saphenous vein graft to posterior descending). 2.  Endoscopic harvest of greater saphenous vein. 3.  Placement of Impella 5.5 direct  percutaneous left ventricular assist device through a 10 mm graft sewn to the ascending aorta.   SURGEON:  Ivin Poot, MD   ASSISTANT:  Jadene Pierini, PA-C.   ANESTHESIA:   General by Dr. Oleta Mouse.   PREPROCEDURE DIAGNOSES:   1.  Ischemic cardiomyopathy.   2.  Class IV heart failure.  3.  Severe 3-vessel coronary artery disease.  4.  Severe peripheral vascular disease.   5.  Prior history of smoking.   POSTOPERATIVE DIAGNOSES:   1.  Ischemic cardiomyopathy.   2.  Class IV heart failure.  3.  Severe 3-vessel coronary artery disease.  4.  Severe peripheral vascular disease.   5.  Prior history of smoking.   CLINICAL NOTE:  The patient is a 69 year old gentleman gentleman who presented to an outside hospital ER with acute shortness of breath with interstitial pulmonary edema and mild peripheral edema.  An echocardiogram showed EF to be 20% to 25% with severe LV  dysfunction.  Subsequent cardiac catheterization demonstrated severe 3-vessel coronary disease with reduced cardiac output.  He was transferred to this hospital for consideration of CABG.  The patient had been on Dohmen term Plavix for stents in his  femoral artery and previous coronary stent placement.  The patient stabilized with medical therapy and diuresis which was started because of an elevated wedge pressure.  Heart Failure  Cardiology assessed the patient and monitor his mixed venous  saturation and titrated his preoperative medications.  I discussed the procedure of high-risk CABG with the patient, including the potential for needing temporary percutaneous Impella LVAD support.  He understood the benefits to include improved  [place]-term survival and improved symptoms and exercise tolerance.  He understood the risks to include stroke, bleeding, infection, organ failure, postoperative pulmonary problems, infection, and death.  He demonstrated his understanding and agreed to  proceed with surgery under what I felt was an informed consent.   OPERATIVE FINDINGS: 1.  Severe diffuse coronary disease with somewhat small coronary targets. 2.  Significant atherosclerotic and atheroma of the ascending aorta and  aortic arch. 3.  Placement of a direct transaortic Impella LVAD for mechanical support to separate from cardiopulmonary bypass.   OPERATIVE REPORT   DATE OF PROCEDURE:  11/15/2019   OPERATION:  Removal of Impella 5.5 LVAD.   SURGEON:  Ivin Poot, MD   ANESTHESIA:  General.   PREOPERATIVE DIAGNOSIS:  Ischemic cardiomyopathy, status post coronary artery bypass graft x4 with postoperative cardiac support with Impella 5.5 catheter placed through a 10 mm graft in the ascending aorta.   POSTOPERATIVE DIAGNOSIS:  Ischemic cardiomyopathy, status post coronary artery bypass graft x4 with postoperative cardiac support with Impella 5.5 catheter placed through a 10 mm graft in the ascending aorta.

## 2019-11-18 NOTE — Progress Notes (Signed)
3 Days Post-Op Procedure(s) (LRB): REMOVAL OF IMPELLA 5.5 LEFT VENTRICULAR ASSIST DEVICE (N/A) TRANSESOPHAGEAL ECHOCARDIOGRAM (TEE) (N/A) Subjective: Back in nsr, feels better, coox now > 60%, NE is off, mil @ .125 Wt at preop- off lasix drip Objective: Vital signs in last 24 hours: Temp:  [97.7 F (36.5 C)-98.8 F (37.1 C)] 98.1 F (36.7 C) (09/20 0834) Pulse Rate:  [57-82] 78 (09/20 0700) Cardiac Rhythm: Normal sinus rhythm (09/20 0415) Resp:  [9-21] 17 (09/20 0700) BP: (86-144)/(53-77) 111/65 (09/20 0700) SpO2:  [87 %-99 %] 97 % (09/20 0700) Weight:  [79.6 kg] 79.6 kg (09/20 0500)  Hemodynamic parameters for last 24 hours: CVP:  [4 mmHg] 4 mmHg  Intake/Output from previous day: 09/19 0701 - 09/20 0700 In: 1356.1 [I.V.:1356.1] Out: 2750 [Urine:2750] Intake/Output this shift: No intake/output data recorded.       Exam    General- alert and comfortable , chest incisions clean and dry    Neck- no JVD, no cervical adenopathy palpable, no carotid bruit   Lungs- clear without rales, wheezes   Cor- regular rate and rhythm, no murmur , gallop   Abdomen- soft, non-tender   Extremities - warm, non-tender, minimal edema   Neuro- oriented, appropriate, no focal weakness   Lab Results: Recent Labs    11/16/19 0449 11/17/19 0443  WBC 12.1* 9.4  HGB 8.6* 10.4*  HCT 26.7* 31.8*  PLT 182 242   BMET:  Recent Labs    11/17/19 0443 11/18/19 0418  NA 136 131*  K 3.6 3.9  CL 90* 89*  CO2 35* 30  GLUCOSE 127* 429*  BUN 26* 20  CREATININE 1.16 1.07  CALCIUM 8.8* 8.2*    PT/INR: No results for input(s): LABPROT, INR in the last 72 hours. ABG    Component Value Date/Time   PHART 7.467 (H) 11/16/2019 0440   HCO3 35.7 (H) 11/16/2019 0440   TCO2 37 (H) 11/16/2019 0440   ACIDBASEDEF 4.0 (H) 11/11/2019 2301   O2SAT 64.9 11/18/2019 0438   CBG (last 3)  Recent Labs    11/18/19 0032 11/18/19 0414 11/18/19 0815  GLUCAP 113* 106* 125*    Assessment/Plan: S/P  Procedure(s) (LRB): REMOVAL OF IMPELLA 5.5 LEFT VENTRICULAR ASSIST DEVICE (N/A) TRANSESOPHAGEAL ECHOCARDIOGRAM (TEE) (N/A) Cont iv amio until mil off Keep EPWs until off iv amio, then remove and start Eliquis Mobilize, titrate HF meds per Cardiology Observe in ICU today  LOS: 13 days    [name] 11/18/2019

## 2019-11-19 ENCOUNTER — Encounter (HOSPITAL_COMMUNITY): Payer: Self-pay | Admitting: Cardiothoracic Surgery

## 2019-11-19 ENCOUNTER — Inpatient Hospital Stay (HOSPITAL_COMMUNITY): Payer: PPO

## 2019-11-19 DIAGNOSIS — I255 Ischemic cardiomyopathy: Secondary | ICD-10-CM | POA: Diagnosis not present

## 2019-11-19 LAB — BASIC METABOLIC PANEL
Anion gap: 10 (ref 5–15)
BUN: 18 mg/dL (ref 8–23)
CO2: 30 mmol/L (ref 22–32)
Calcium: 9 mg/dL (ref 8.9–10.3)
Chloride: 99 mmol/L (ref 98–111)
Creatinine, Ser: 0.98 mg/dL (ref 0.61–1.24)
GFR calc Af Amer: >60 mL/min (ref >60)
GFR calc non Af Amer: >60 mL/min (ref >60)
Glucose, Bld: 116 mg/dL — ABNORMAL HIGH (ref 70–99)
Potassium: 4.4 mmol/L (ref 3.5–5.1)
Sodium: 139 mmol/L (ref 135–145)

## 2019-11-19 LAB — CBC
HCT: 28.5 % — ABNORMAL LOW (ref 39.0–52.0)
Hemoglobin: 9.3 g/dL — ABNORMAL LOW (ref 13.0–17.0)
MCH: 31 pg (ref 26.0–34.0)
MCHC: 32.6 g/dL (ref 30.0–36.0)
MCV: 95 fL (ref 80.0–100.0)
Platelets: 285 10*3/uL (ref 150–400)
RBC: 3 MIL/uL — ABNORMAL LOW (ref 4.22–5.81)
RDW: 13.7 % (ref 11.5–15.5)
WBC: 7.4 10*3/uL (ref 4.0–10.5)
nRBC: 0.3 % — ABNORMAL HIGH (ref 0.0–0.2)

## 2019-11-19 LAB — COOXEMETRY PANEL
Carboxyhemoglobin: 1.3 % (ref 0.5–1.5)
Methemoglobin: 1 % (ref 0.0–1.5)
O2 Saturation: 60.8 %
Total hemoglobin: 9.5 g/dL — ABNORMAL LOW (ref 12.0–16.0)

## 2019-11-19 LAB — GLUCOSE, CAPILLARY
Glucose-Capillary: 129 mg/dL — ABNORMAL HIGH (ref 70–99)
Glucose-Capillary: 108 mg/dL — ABNORMAL HIGH (ref 70–99)
Glucose-Capillary: 119 mg/dL — ABNORMAL HIGH (ref 70–99)
Glucose-Capillary: 121 mg/dL — ABNORMAL HIGH (ref 70–99)
Glucose-Capillary: 111 mg/dL — ABNORMAL HIGH (ref 70–99)

## 2019-11-19 LAB — DIGOXIN LEVEL: Digoxin Level: 0.6 ng/mL — ABNORMAL LOW (ref 1.0–2.0)

## 2019-11-19 MED ORDER — APIXABAN 5 MG PO TABS
5.0000 mg | ORAL_TABLET | Freq: Two times a day (BID) | ORAL | Status: DC
  Administered 2019-11-20 – 2019-11-21 (×3): 5 mg via ORAL
  Filled 2019-11-19 (×3): qty 1

## 2019-11-19 MED ORDER — SACUBITRIL-VALSARTAN 24-26 MG PO TABS
1.0000 | ORAL_TABLET | Freq: Two times a day (BID) | ORAL | Status: DC
  Administered 2019-11-19 – 2019-11-21 (×5): 1 via ORAL
  Filled 2019-11-19 (×6): qty 1

## 2019-11-19 MED ORDER — ASPIRIN EC 81 MG PO TBEC
81.0000 mg | DELAYED_RELEASE_TABLET | Freq: Every day | ORAL | Status: DC
  Administered 2019-11-20 – 2019-11-21 (×2): 81 mg via ORAL
  Filled 2019-11-19 (×2): qty 1

## 2019-11-19 MED ORDER — SODIUM CHLORIDE 0.9% IV SOLUTION: Freq: Once | INTRAVENOUS | Status: DC

## 2019-11-19 NOTE — Progress Notes (Signed)
CARDIAC REHAB PHASE I   PRE:  Rate/Rhythm: 67 SR    BP: sitting 122/75    SaO2: 93 RA  MODE:  Ambulation: 410 ft   POST:  Rate/Rhythm: 75 SR with PVC    BP: sitting 126/89     SaO2: 97 RA  Pt doing well. Able to stand independently and walk with RW, standby assist. Steady. Rested x2 for DOE.  To bed for EPW pull. VSS. Pt likes to walk, encouraged more later. He is thinking he would like RW for home.  [place], ACSM 11/19/2019 11:16 AM

## 2019-11-19 NOTE — Progress Notes (Addendum)
TCTS DAILY ICU PROGRESS NOTE                   [address]            [place] [address]          [phone]   4 Days Post-Op Procedure(s) (LRB): REMOVAL OF IMPELLA 5.5 LEFT VENTRICULAR ASSIST DEVICE (N/A) TRANSESOPHAGEAL ECHOCARDIOGRAM (TEE) (N/A)  Total Length of Stay:  LOS: 14 days   Subjective: Up in the bedside chair, says he feels good, no complaints or concerns.   Brief a-fib last night that converted back to SR without intervention. IV amiodarone infusing.   On RA.   Objective: Vital signs in last 24 hours: Temp:  [97.9 F (36.6 C)-98.9 F (37.2 C)] 98.1 F (36.7 C) (09/21 0400) Pulse Rate:  [57-117] 73 (09/21 0700) Cardiac Rhythm: Normal sinus rhythm (09/20 2000) Resp:  [11-21] 17 (09/21 0700) BP: (88-131)/(54-93) 122/93 (09/21 0700) SpO2:  [91 %-99 %] 93 % (09/21 0700) Weight:  [79.5 kg] 79.5 kg (09/21 0500)  Filed Weights   11/17/19 0454 11/18/19 0500 11/19/19 0500  Weight: 82 kg 79.6 kg 79.5 kg    Weight change: -0.1 kg   Hemodynamic parameters for last 24 hours: CVP:  [5 mmHg-13 mmHg] 10 mmHg  Intake/Output from previous day: 09/20 0701 - 09/21 0700 In: 1556.2 [P.O.:480; I.V.:976.1; IV Piggyback:100.1] Out: 1825 [Urine:1825]  Intake/Output this shift: No intake/output data recorded.  Current Meds: Scheduled Meds: . sodium chloride   Intravenous Once  . aspirin EC  325 mg Oral Daily  . bisacodyl  10 mg Oral Daily   Or  . bisacodyl  10 mg Rectal Daily  . clonazepam  0.25 mg Oral Daily  . colchicine  0.6 mg Oral BID  . digoxin  0.125 mg Oral Daily  . docusate sodium  200 mg Oral Daily  . enoxaparin (LOVENOX) injection  30 mg Subcutaneous Q24H  . feeding supplement (ENSURE ENLIVE)  237 mL Oral TID WC  . furosemide  40 mg Oral Daily  . insulin aspart  0-15 Units Subcutaneous TID WC  . mouth rinse  15 mL Mouth Rinse BID  . pantoprazole  40 mg Oral Daily  . potassium chloride  20 mEq Oral BID  . sacubitril-valsartan  1  tablet Oral BID  . sodium chloride flush  10-40 mL Intracatheter Q12H  . sodium chloride flush  3 mL Intravenous Q12H  . spironolactone  25 mg Oral Daily   Continuous Infusions: . sodium chloride    . amiodarone 60 mg/hr (11/19/19 0700)   PRN Meds:.ondansetron (ZOFRAN) IV, oxyCODONE, pneumococcal 23 valent vaccine, sodium chloride flush, sodium chloride flush, traMADol, traZODone  General appearance: alert, cooperative and no distress Neurologic: intact Heart: A-fib for a few minutes last night, back in SR. Lungs: clear to auscultation bilaterally. CXR shows low lung volumes but clear lung fields, no effusions, no PTX Abdomen: Soft and non-tender. (Last BM Sunday) Extremities: Warm and well perfused, no peripheral edema. Wound: the sternotomy, right neck,  and RLE EVH incisions are all intact and healing well.   Lab Results: CBC: Recent Labs    11/17/19 0443 11/19/19 0312  WBC 9.4 7.4  HGB 10.4* 9.3*  HCT 31.8* 28.5*  PLT 242 285   BMET:  Recent Labs    11/18/19 0418 11/19/19 0312  NA 131* 139  K 3.9 4.4  CL 89* 99  CO2 30 30  GLUCOSE 429* 116*  BUN 20 18  CREATININE 1.07 0.98  CALCIUM 8.2* 9.0    CMET: Lab Results  Component Value Date   WBC 7.4 11/19/2019   HGB 9.3 (L) 11/19/2019   HCT 28.5 (L) 11/19/2019   PLT 285 11/19/2019   GLUCOSE 116 (H) 11/19/2019   CHOL 154 10/24/2019   TRIG 120 10/24/2019   HDL 61 10/24/2019   LDLCALC 69 10/24/2019   ALT 16 11/18/2019   AST 14 (L) 11/18/2019   NA 139 11/19/2019   K 4.4 11/19/2019   CL 99 11/19/2019   CREATININE 0.98 11/19/2019   BUN 18 11/19/2019   CO2 30 11/19/2019   TSH 3.303 11/07/2019   PSA 0.83 06/15/2015   INR 1.4 (H) 11/11/2019   HGBA1C 5.6 11/07/2019      PT/INR: No results for input(s): LABPROT, INR in the last 72 hours. Radiology: [facility] Chest Port 1 View  Result Date: 11/19/2019 CLINICAL DATA:  Soreness. Recent removal of Impella device. History of coronary artery disease. EXAM: PORTABLE  CHEST 1 VIEW COMPARISON:  11/17/2019. FINDINGS: Right PICC line in stable position. Prior CABG. Stable cardiomegaly. Low lung volumes with bibasilar atelectasis. No pleural effusion noted on today's exam. No pneumothorax. IMPRESSION: 1.  Right PICC line stable position. 2.  Prior CABG.  Stable cardiomegaly. 3. Low lung volumes with bibasilar atelectasis again noted. No pleural effusion noted on today's exam. No pneumothorax. Electronically Signed   By: Marcello Moores  Register   On: 11/19/2019 06:36     Assessment/Plan: S/P Procedure(s) (LRB): REMOVAL OF IMPELLA 5.5 LEFT VENTRICULAR ASSIST DEVICE (N/A) TRANSESOPHAGEAL ECHOCARDIOGRAM (TEE) (N/A)  -POD 8 CABG x 4 and placement of Impella 5.5 for low EF. POD-4 removal of Impella. Stable hemodynamics, CoOx 60 this am, milrinone discontinued by HF team. Plan removal of pacer wires today.  Advance activity.   -Post-op atrial fibrillation- mostly SR past 24 hours on amiodarone infusion, brief episode of a-fib last night that converted without intervention. Continue IV amiodarone another 24 hours then transition to PO.  OK to proceed with anticoagulation tomorrow after pacer wires removed today.   -Volume excess- resolved, now at pre-op weight.   -DVT PPX- continue with enoxaparin.  -Disposition- discussed with HF and Dr. Darcey Nora, [place] to transfer to 4E.    Antony Odea  [phone] 11/19/2019 8:16 AM  Progressing well with maintenance of nsr  Milrinone is off, cont iv amiodarone at 30 for another day DC EPWs today then start Eliquis in am Transfer order to 4E in place  patient examined and medical record reviewed,agree with above note. [name] 11/19/2019

## 2019-11-19 NOTE — Progress Notes (Addendum)
Patient ID: [name], male   DOB: 05/29/1950, 69 y.o.   MRN: [id number]     Advanced Heart Failure Rounding Note  PCP-Cardiologist: Nelva Bush, MD   Subjective:    Co-ox 61% on milrinone 0.125. CVP 10-11 but wt stable.   Dig level ok.   Had brief run of Afib overnight into the 140s. Back in NSR this am.   OOB sitting up in chair. No CP or dyspnea. Complains of rt great toe pain. H/o gout. Pain improving w/ start of colchicine.    Objective:   Weight Range: 79.5 kg Body mass index is 28.29 kg/m.   Vital Signs:   Temp:  [97.9 F (36.6 C)-98.9 F (37.2 C)] 98.1 F (36.7 C) (09/21 0400) Pulse Rate:  [57-117] 73 (09/21 0700) Resp:  [11-21] 17 (09/21 0700) BP: (88-131)/(54-93) 122/93 (09/21 0700) SpO2:  [91 %-99 %] 93 % (09/21 0700) Weight:  [79.5 kg] 79.5 kg (09/21 0500) Last BM Date: 11/18/19  Weight change: Filed Weights   11/17/19 0454 11/18/19 0500 11/19/19 0500  Weight: 82 kg 79.6 kg 79.5 kg    Intake/Output:   Intake/Output Summary (Last 24 hours) at 11/19/2019 0488 Last data filed at 11/19/2019 0700 Gross per 24 hour  Intake 1556.23 ml  Output 1825 ml  Net -268.77 ml      Physical Exam   CVP 10-11 General:  Well appearing, sitting up in chair. No respiratory distress  HEENT: normal Neck: supple. No JVD, no bruits Cor: PMI nondisplaced. RRR, no murmurs rubs or gallops, sternotomy site ok Lungs: clear  Abdomen: soft, nontender, nondistended. No hepatosplenomegaly. No bruits or masses. Good bowel sounds. Extremities: no cyanosis, clubbing, rash, no edema, Neuro: alert & orientedx3, cranial nerves grossly intact. moves all 4 extremities w/o difficulty. Affect stressed    Telemetry   Afib w/ RVR 140s overnight, brief. NSR currently Personally reviewed  Labs    CBC Recent Labs    11/17/19 0443 11/19/19 0312  WBC 9.4 7.4  HGB 10.4* 9.3*  HCT 31.8* 28.5*  MCV 94.1 95.0  PLT 242 891   Basic Metabolic Panel Recent Labs     11/18/19 0418 11/19/19 0312  NA 131* 139  K 3.9 4.4  CL 89* 99  CO2 30 30  GLUCOSE 429* 116*  BUN 20 18  CREATININE 1.07 0.98  CALCIUM 8.2* 9.0   Liver Function Tests Recent Labs    11/17/19 0443 11/18/19 0418  AST 15 14*  ALT 20 16  ALKPHOS 55 50  BILITOT 0.9 0.8  PROT 6.4* 5.7*  ALBUMIN 3.1* 2.6*   No results for input(s): LIPASE, AMYLASE in the last 72 hours. Cardiac Enzymes No results for input(s): CKTOTAL, CKMB, CKMBINDEX, TROPONINI in the last 72 hours.  BNP: BNP (last 3 results) Recent Labs    11/05/19 2228  BNP 468.7*    ProBNP (last 3 results) No results for input(s): PROBNP in the last 8760 hours.   D-Dimer No results for input(s): DDIMER in the last 72 hours. Hemoglobin A1C No results for input(s): HGBA1C in the last 72 hours. Fasting Lipid Panel No results for input(s): CHOL, HDL, LDLCALC, TRIG, CHOLHDL, LDLDIRECT in the last 72 hours. Thyroid Function Tests No results for input(s): TSH, T4TOTAL, T3FREE, THYROIDAB in the last 72 hours.  Invalid input(s): FREET3  Other results:   Imaging    DG Chest Port 1 View  Result Date: 11/19/2019 CLINICAL DATA:  Soreness. Recent removal of Impella device. History of coronary artery disease. EXAM:  PORTABLE CHEST 1 VIEW COMPARISON:  11/17/2019. FINDINGS: Right PICC line in stable position. Prior CABG. Stable cardiomegaly. Low lung volumes with bibasilar atelectasis. No pleural effusion noted on today's exam. No pneumothorax. IMPRESSION: 1.  Right PICC line stable position. 2.  Prior CABG.  Stable cardiomegaly. 3. Low lung volumes with bibasilar atelectasis again noted. No pleural effusion noted on today's exam. No pneumothorax. Electronically Signed   By: Marcello Moores  Register   On: 11/19/2019 06:36     Medications:     Scheduled Medications: . aspirin EC  325 mg Oral Daily  . bisacodyl  10 mg Oral Daily   Or  . bisacodyl  10 mg Rectal Daily  . clonazepam  0.25 mg Oral Daily  . colchicine  0.6 mg Oral  BID  . digoxin  0.125 mg Oral Daily  . docusate sodium  200 mg Oral Daily  . enoxaparin (LOVENOX) injection  30 mg Subcutaneous Q24H  . feeding supplement (ENSURE ENLIVE)  237 mL Oral TID WC  . furosemide  40 mg Oral Daily  . insulin aspart  0-15 Units Subcutaneous TID WC  . mouth rinse  15 mL Mouth Rinse BID  . pantoprazole  40 mg Oral Daily  . potassium chloride  20 mEq Oral BID  . sodium chloride flush  10-40 mL Intracatheter Q12H  . sodium chloride flush  3 mL Intravenous Q12H  . spironolactone  25 mg Oral Daily    Infusions: . sodium chloride    . amiodarone 60 mg/hr (11/19/19 0700)  . milrinone 0.125 mcg/kg/min (11/19/19 0700)  . norepinephrine (LEVOPHED) Adult infusion Stopped (11/18/19 0002)    PRN Medications: LORazepam, ondansetron (ZOFRAN) IV, oxyCODONE, pneumococcal 23 valent vaccine, sodium chloride flush, sodium chloride flush, traMADol, traZODone    Assessment/Plan   1. CAD: H/o PCI to LCX in 2017. LHC 11/05/19 with multivessel CAD, including calcified 80% distal LM stenosis, small diffusely diseased distal LAD, chronically occluded OM1, and 50% mid RCA lesion.  He is now s/p LIMA-LAD, SVG-OM1, SVG-OM2 and SVG-PDA. Stable w/o CP - Intolerance of statins, he is on Praluent at home. - Continue ASA.  2. Acute systolic CHF: Ischemic cardiomyopathy.  Echo pre-op with EF 25-30%.  Impella 5.5 removed 9/17.  Now on milrinone at 0.125. Co-ox 61%. Diuresed well on lasix gtt, now back down to pre-op wt.   - Stop milrinone today and follow co-ox - Restart low dose Entresto 24-26 mg bid. Monitor BP  - Continue Spiro 25 mg daily  - Continue Lasix 40 mg daily  - consider future addition of Farxiga  - continue digoxin 0.125. Dig level ok (0.6) - If EF remains low after CABG, will be CRT-D candidate with LBBB.  3. PVCs: Frequent.  Now on amiodarone.  - no change 4. Paroxysmal atrial fibrillation/flutter: Went back into rapid afib overnight, back in NSR - stop milrinone today   - reduce amio gtt down to 30 mg/hr then transition to PO - He will need a/c. Will start apixaban once pacer wires are removed  5. Hypokalemia - resolved, K 4.4 today  - continue spiro  7. Rt Toe Pain - empirically treating for gout. - pain improving w/ colchicine    Length of Stay: [address], PA-C  11/19/2019, 7:12 AM  Advanced Heart Failure Team Pager [phone] (M-F; 7a - 4p)  Please contact [facility] for night-coverage after hours (4p -7a ) and weekends on [email or web address]  Patient seen with PA, agree with the above note.  Co-ox 61% this morning on milrinone 0.125.  Brief atrial fibrillation overnight, now back in NSR.  Weight stable, back to baseline pre-op.  Digoxin level 0.6 and creatinine stable.   General: NAD Neck: No JVD, no thyromegaly or thyroid nodule.  Lungs: Clear to auscultation bilaterally with normal respiratory effort. CV: Nondisplaced PMI.  Heart regular S1/S2, no S3/S4, no murmur.  No peripheral edema.   Abdomen: Soft, nontender, no hepatosplenomegaly, no distention.  Skin: Intact without lesions or rashes.  Neurologic: Alert and oriented x 3.  Psych: Normal affect. Extremities: No clubbing or cyanosis.  HEENT: Normal.   Stop milrinone today, hopefully will decrease drive to atrial fibrillation.  Will need Eliquis after pacing wires out.  Continue amiodarone gtt for now.   Volume status ok, CVP 8 on my read.  Good co-ox.  - Continue digoxin.  - D/c milriinone.  - Lasix 40 mg daily - digoxin 0.125 daily.  - Continue spironolactone 25 daily.  - Agree with add Entresto 24/26 bid, follow BP carefully.   Gout, added colchicine.   Ambulate, improving.   CRITICAL CARE Performed by: Loralie Champagne  Total critical care time: 35 minutes  Critical care time was exclusive of separately billable procedures and treating other patients.  Critical care was necessary to treat or prevent imminent or life-threatening deterioration.  Critical care  was time spent personally by me on the following activities: development of treatment plan with patient and/or surrogate as well as nursing, discussions with consultants, evaluation of patient's response to treatment, examination of patient, obtaining history from patient or surrogate, ordering and performing treatments and interventions, ordering and review of laboratory studies, ordering and review of radiographic studies, pulse oximetry and re-evaluation of patient's condition.  Loralie Champagne 11/19/2019 7:37 AM

## 2019-11-19 NOTE — Progress Notes (Signed)
Transitions of Care Pharmacist Note  KWADWO TARAS is a 69 y.o. male that has been diagnosed with A Fib and will be prescribed Eliquis (apixaban) at discharge.   Patient Education: I provided the following education on Apixaban to the patient: How to take the medication Described what the medication is Signs of bleeding Signs/symptoms of VTE and stroke  Answered their questions  Discharge Medications Plan: The patient wants to have their discharge medications filled by the Transitions of Care pharmacy rather than their usual pharmacy.  The discharge orders pharmacy has been changed to the Transitions of Care pharmacy, the patient will receive a phone call regarding co-pay, and their medications will be delivered by the Transitions of Care pharmacy.    Thank you,   Norina Buzzard, PharmD PGY1 Pharmacy Resident 11/19/2019 7:52 PM  November 19, 2019

## 2019-11-19 NOTE — Progress Notes (Signed)
Patient arrived from [facility], report received. Heart Monitor applied and CCMD notified. Vitals obtained. Amio drip running per order. Patient states no complaints at this time.  [place]

## 2019-11-20 LAB — COOXEMETRY PANEL
Carboxyhemoglobin: 1.7 % — ABNORMAL HIGH (ref 0.5–1.5)
Carboxyhemoglobin: 1.8 % — ABNORMAL HIGH (ref 0.5–1.5)
Methemoglobin: 1.4 % (ref 0.0–1.5)
Methemoglobin: 1.5 % (ref 0.0–1.5)
O2 Saturation: 44.1 %
O2 Saturation: 52.5 %
Total hemoglobin: 10.5 g/dL — ABNORMAL LOW (ref 12.0–16.0)
Total hemoglobin: 11.4 g/dL — ABNORMAL LOW (ref 12.0–16.0)

## 2019-11-20 LAB — BASIC METABOLIC PANEL
Anion gap: 11 (ref 5–15)
BUN: 20 mg/dL (ref 8–23)
CO2: 28 mmol/L (ref 22–32)
Calcium: 9.1 mg/dL (ref 8.9–10.3)
Chloride: 97 mmol/L — ABNORMAL LOW (ref 98–111)
Creatinine, Ser: 1.07 mg/dL (ref 0.61–1.24)
GFR calc Af Amer: >60 mL/min (ref >60)
GFR calc non Af Amer: >60 mL/min (ref >60)
Glucose, Bld: 104 mg/dL — ABNORMAL HIGH (ref 70–99)
Potassium: 4.7 mmol/L (ref 3.5–5.1)
Sodium: 136 mmol/L (ref 135–145)

## 2019-11-20 LAB — ECHO INTRAOPERATIVE TEE
AR max vel: 2.99 cm2
AV Area VTI: 2.79 cm2
AV Area mean vel: 2.72 cm2
AV Mean grad: 2 mmHg
AV Peak grad: 4.1 mmHg
Ao pk vel: 1.01 m/s
Ao-asc: 3.5 cm
Height: 66 in
LVOT diameter: 25 mm
MV Vena cont: 0.3 cm
Mean grad: 1 mmHg
STJ: 2.2 cm
Sinus: 3.2 cm
Weight: 2751.34 oz

## 2019-11-20 LAB — GLUCOSE, CAPILLARY
Glucose-Capillary: 109 mg/dL — ABNORMAL HIGH (ref 70–99)
Glucose-Capillary: 92 mg/dL (ref 70–99)
Glucose-Capillary: 121 mg/dL — ABNORMAL HIGH (ref 70–99)
Glucose-Capillary: 111 mg/dL — ABNORMAL HIGH (ref 70–99)

## 2019-11-20 LAB — CBC
HCT: 32.2 % — ABNORMAL LOW (ref 39.0–52.0)
Hemoglobin: 10.3 g/dL — ABNORMAL LOW (ref 13.0–17.0)
MCH: 30.7 pg (ref 26.0–34.0)
MCHC: 32 g/dL (ref 30.0–36.0)
MCV: 95.8 fL (ref 80.0–100.0)
Platelets: 399 10*3/uL (ref 150–400)
RBC: 3.36 MIL/uL — ABNORMAL LOW (ref 4.22–5.81)
RDW: 13.5 % (ref 11.5–15.5)
WBC: 8.8 10*3/uL (ref 4.0–10.5)
nRBC: 0 % (ref 0.0–0.2)

## 2019-11-20 MED ORDER — DIGOXIN 125 MCG PO TABS
0.1250 mg | ORAL_TABLET | Freq: Every day | ORAL | 1 refills | Status: DC
Start: 2019-11-21 — End: 2019-12-05

## 2019-11-20 MED ORDER — POTASSIUM CHLORIDE CRYS ER 20 MEQ PO TBCR: 20.0000 meq | EXTENDED_RELEASE_TABLET | Freq: Two times a day (BID) | ORAL | 2 refills | Status: DC

## 2019-11-20 MED ORDER — APIXABAN 5 MG PO TABS
5.0000 mg | ORAL_TABLET | Freq: Two times a day (BID) | ORAL | 2 refills | Status: DC
Start: 2019-11-20 — End: 2019-12-18

## 2019-11-20 MED ORDER — CLONAZEPAM 0.25 MG PO TBDP
0.2500 mg | ORAL_TABLET | Freq: Every day | ORAL | 2 refills | Status: DC
Start: 2019-11-21 — End: 2020-01-07

## 2019-11-20 MED ORDER — SODIUM CHLORIDE 0.9% FLUSH: 10.0000 mL | INTRAVENOUS | Status: DC | PRN

## 2019-11-20 MED ORDER — SPIRONOLACTONE 25 MG PO TABS
25.0000 mg | ORAL_TABLET | Freq: Every day | ORAL | 2 refills | Status: DC
Start: 2019-11-21 — End: 2019-12-18

## 2019-11-20 MED ORDER — OXYCODONE HCL 5 MG PO TABS
5.0000 mg | ORAL_TABLET | ORAL | 0 refills | Status: AC | PRN
Start: 2019-11-20 — End: 2019-11-25

## 2019-11-20 MED ORDER — SACUBITRIL-VALSARTAN 24-26 MG PO TABS: 1.0000 | ORAL_TABLET | Freq: Two times a day (BID) | ORAL | 2 refills | Status: DC

## 2019-11-20 MED ORDER — AMIODARONE HCL 200 MG PO TABS
200.0000 mg | ORAL_TABLET | Freq: Two times a day (BID) | ORAL | 1 refills | Status: DC
Start: 2019-11-20 — End: 2019-12-04

## 2019-11-20 MED ORDER — AMIODARONE HCL 200 MG PO TABS
200.0000 mg | ORAL_TABLET | Freq: Two times a day (BID) | ORAL | Status: DC
  Administered 2019-11-20 – 2019-11-21 (×3): 200 mg via ORAL
  Filled 2019-11-20 (×3): qty 1

## 2019-11-20 MED ORDER — COLCHICINE 0.6 MG PO TABS
0.6000 mg | ORAL_TABLET | Freq: Two times a day (BID) | ORAL | 1 refills | Status: DC
Start: 2019-11-20 — End: 2020-03-02

## 2019-11-20 MED ORDER — SODIUM CHLORIDE 0.9% FLUSH
10.0000 mL | Freq: Two times a day (BID) | INTRAVENOUS | Status: DC
  Administered 2019-11-20: 20 mL

## 2019-11-20 MED ORDER — FUROSEMIDE 40 MG PO TABS
40.0000 mg | ORAL_TABLET | Freq: Every day | ORAL | 1 refills | Status: DC
Start: 2019-11-21 — End: 2019-12-05

## 2019-11-20 MED FILL — SPIRONOLACTONE 25 MG TABLET: 25 | 30 days supply | Qty: 30 | Fill #0

## 2019-11-20 MED FILL — oxyCODONE HCL 5 MG TABS: 5 | 5 days supply | Qty: 30 | Fill #0

## 2019-11-20 MED FILL — ENTRESTO 24 MG-26 MG TABLET: 24-26 | 30 days supply | Qty: 60 | Fill #0

## 2019-11-20 MED FILL — COLCHICINE 0.6 MG TABS: 0.6 | 15 days supply | Qty: 30 | Fill #0

## 2019-11-20 MED FILL — FUROSEMIDE 40 MG TABLET: 40 | 30 days supply | Qty: 30 | Fill #0

## 2019-11-20 MED FILL — POTASSIUM CHLORIDE 20meqER: 20 | 15 days supply | Qty: 30 | Fill #0

## 2019-11-20 MED FILL — DIGOXIN 0.125 MG TABLET: 125 | 30 days supply | Qty: 30 | Fill #0

## 2019-11-20 MED FILL — ELIQUIS 5 MG TABLET: 5 | 30 days supply | Qty: 60 | Fill #0

## 2019-11-20 MED FILL — AMIODARONE HCL 200 MG TAB: 200 | 40 days supply | Qty: 60 | Fill #0

## 2019-11-20 MED FILL — clonazePAM 0.25 MG TBDP: 0.25 | 30 days supply | Qty: 30 | Fill #0

## 2019-11-20 NOTE — Care Management Important Message (Signed)
Important Message  Patient Details  Name: [name] MRN: [id number] Date of Birth: [birth year]   Medicare Important Message Given:  Yes     Shelda Altes 11/20/2019, 8:41 AM

## 2019-11-20 NOTE — Progress Notes (Signed)
CARDIAC REHAB PHASE I   PRE:  Rate/Rhythm: 68 SR    BP: sitting 101/57    SaO2: 93 RA  MODE:  Ambulation: 470 ft   POST:  Rate/Rhythm: 81 SR    BP: sitting 116/67     SaO2: 93 RA  Pt in recliner on my arrival. Stood independently, used RW in hall with tennis shoes on. Steady. Rest x1. To recliner. VSS. Encouraged x2 more walks and IS. 1131-1205  Darrick Meigs CES, ACSM 11/20/2019 12:03 PM

## 2019-11-20 NOTE — Progress Notes (Signed)
Physical Therapy Treatment Patient Details Name: [name] MRN: [id number] DOB: Jul 21, 1950 Today's Date: 11/20/2019    History of Present Illness Pt admitted 11/05/19 after cath with CAD and ICM. Pt s/p high risk CABG with impella 5.5 placement 9/13-9/17. PMH includes CAD, NSTEMI, LBBB, NICM, PVD s/p mechanical thrombectomy, HTN, HLD, hiatal hernia.   PT Comments    Pt progressing well with mobility. Able to perform transfers and short ambulation distance at supervision-level. Pt declined hallway ambulation secondary to fatigue; session focused on bed mobility and transfer training techniques while maintaining sternal precautions. Pt hopeful for d/c home soon.  Post-mobility BP 115/67, HR 58   Follow Up Recommendations  Home health PT;Supervision for mobility/OOB     Equipment Recommendations  None recommended by PT    Recommendations for Other Services       Precautions / Restrictions Precautions Precautions: Sternal;Fall    Mobility  Bed Mobility Overal bed mobility: Independent Bed Mobility: Rolling;Sidelying to Sit;Sit to Sidelying           General bed mobility comments: Practiced log roll technique with bed flat and no rails; indep with good ability to maintain sternal precautions  Transfers Overall transfer level: Needs assistance Equipment used: None Transfers: Sit to/from Stand Sit to Stand: Supervision         General transfer comment: Pt initially pushing from recliner armrests, practiced multiple additional trials from EOB with hands at sides and hands on knees, pt performing well with supervision for technique  Ambulation/Gait Ambulation/Gait assistance: Supervision Gait Distance (Feet): 16 Feet Assistive device: None;IV Pole Gait Pattern/deviations: Step-through pattern;Decreased stride length;Trunk flexed Gait velocity: Decreased   General Gait Details: Slow, guarded steps without DME, pt reaching to IV pole for support; supervision for  safety; declined hallway ambulation secondary to fatigue   Stairs Stairs:  (Pt declined stair training; reports confident ascending/descending 2 steps with rail support at home)           Wheelchair Mobility    Modified Rankin (Stroke Patients Only)       Balance Overall balance assessment: No apparent balance deficits (not formally assessed) Sitting-balance support: No upper extremity supported;Feet supported Sitting balance-Leahy Scale: Good       Standing balance-Leahy Scale: Fair Standing balance comment: Can stand and walk without UE support; dynamic stability improved with at least single UE support                            Cognition Arousal/Alertness: Awake/alert Behavior During Therapy: WFL for tasks assessed/performed;Flat affect Overall Cognitive Status: Within Functional Limits for tasks assessed                                 General Comments: Flat, tired affect; cognitive WFL for simple tasks, not formally assessed      Exercises      General Comments General comments (skin integrity, edema, etc.): Pt reports planning to continue use of RW (wife purchased yesterday); reviewed and reinforced sternal precautions, activity recommendations      Pertinent Vitals/Pain Pain Assessment: Faces Faces Pain Scale: Hurts a little bit Pain Location: Generalized, test Pain Descriptors / Indicators: Tiring Pain Intervention(s): Monitored during session    Home Living                      Prior Function  PT Goals (current goals can now be found in the care plan section) Progress towards PT goals: Goals met and updated - see care plan    Frequency    Min 3X/week      PT Plan Current plan remains appropriate    Co-evaluation              AM-PAC PT "6 Clicks" Mobility   Outcome Measure  Help needed turning from your back to your side while in a flat bed without using bedrails?: None Help needed  moving from lying on your back to sitting on the side of a flat bed without using bedrails?: None Help needed moving to and from a bed to a chair (including a wheelchair)?: None Help needed standing up from a chair using your arms (e.g., wheelchair or bedside chair)?: None Help needed to walk in hospital room?: None Help needed climbing 3-5 steps with a railing? : A Little 6 Click Score: 23    End of Session   Activity Tolerance: Patient tolerated treatment well;Patient limited by fatigue Patient left: in bed;with call bell/phone within reach Nurse Communication: Mobility status PT Visit Diagnosis: Other abnormalities of gait and mobility (R26.89);Difficulty in walking, not elsewhere classified (R26.2);Muscle weakness (generalized) (M62.81)     Time: 6222-9798 PT Time Calculation (min) (ACUTE ONLY): 14 min  Charges:  $Therapeutic Activity: 8-22 mins                    Mabeline Caras, PT, DPT Acute Rehabilitation Services  Pager [phone] [place] 11/20/2019, 8:44 AM

## 2019-11-20 NOTE — Progress Notes (Signed)
Mobility Specialist: Progress Note   11/20/19 1633  Mobility  Activity Ambulated in hall  Level of Assistance Standby assist, set-up cues, supervision of patient - no hands on  Assistive Device Front wheel walker  Distance Ambulated (ft) 470 ft  Mobility Response Tolerated well  Mobility performed by Mobility specialist  Bed Position Chair  $Mobility charge 1 Mobility   Pre-Mobility: 66 HR, 93/61 BP, 89% SpO2 During Mobility: 71 HR, 92% SpO2 Post-Mobility: 77 HR, 119/71 BP, 96% SpO2  Upon entering pt's room pt's sats were at 82% w/ good pleth. Pt was axs. Pt's sats increased to 89% after sitting on EOB so ambulation continued. Pt's sats increased to the low 90s during ambulation and pt said he felt better as we continued walking. Pt is in chair w/ call bell in reach.   [facility] Gerilyn Stargell Mobility Specialist

## 2019-11-20 NOTE — Progress Notes (Addendum)
Patient ID: [name], male   DOB: 19-Jun-1950, 69 y.o.   MRN: [id number]     Advanced Heart Failure Rounding Note  PCP-Cardiologist: Nelva Bush, MD   Subjective:    Yesterday milrinone stopped. CO-OX 44%.   Amio drip stopped this morning.   Denies SOB. Hoping to go home tomorrow.     Objective:   Weight Range: 78.6 kg Body mass index is 27.97 kg/m.   Vital Signs:   Temp:  [97.8 F (36.6 C)-98.6 F (37 C)] 98.2 F (36.8 C) (09/22 0731) Pulse Rate:  [56-77] 58 (09/22 0846) Resp:  [13-22] 17 (09/22 0731) BP: (91-129)/(50-76) 91/55 (09/22 0731) SpO2:  [91 %-99 %] 93 % (09/22 0731) Weight:  [78.6 kg] 78.6 kg (09/22 0433) Last BM Date: 11/18/19  Weight change: Filed Weights   11/18/19 0500 11/19/19 0500 11/20/19 0433  Weight: 79.6 kg 79.5 kg 78.6 kg    Intake/Output:   Intake/Output Summary (Last 24 hours) at 11/20/2019 0915 Last data filed at 11/19/2019 2128 Gross per 24 hour  Intake 596.71 ml  Output 1225 ml  Net -628.29 ml      Physical Exam   General:   No resp difficulty HEENT: normal Neck: supple. JVP 5-6  Carotids 2+ bilat; no bruits. No lymphadenopathy or thryomegaly appreciated. Cor: PMI nondisplaced. Regular rate & rhythm. No rubs, gallops or murmurs. Lungs: clear Abdomen: soft, nontender, nondistended. No hepatosplenomegaly. No bruits or masses. Good bowel sounds. Extremities: no cyanosis, clubbing, rash, edema. RUE PICC  Neuro: alert & orientedx3, cranial nerves grossly intact. moves all 4 extremities w/o difficulty. Affect pleasant   Telemetry  Sinus Brady 50s   Labs    CBC Recent Labs    11/19/19 0312 11/20/19 0500  WBC 7.4 8.8  HGB 9.3* 10.3*  HCT 28.5* 32.2*  MCV 95.0 95.8  PLT 285 315   Basic Metabolic Panel Recent Labs    11/19/19 0312 11/20/19 0500  NA 139 136  K 4.4 4.7  CL 99 97*  CO2 30 28  GLUCOSE 116* 104*  BUN 18 20  CREATININE 0.98 1.07  CALCIUM 9.0 9.1   Liver Function Tests Recent Labs     11/18/19 0418  AST 14*  ALT 16  ALKPHOS 50  BILITOT 0.8  PROT 5.7*  ALBUMIN 2.6*   No results for input(s): LIPASE, AMYLASE in the last 72 hours. Cardiac Enzymes No results for input(s): CKTOTAL, CKMB, CKMBINDEX, TROPONINI in the last 72 hours.  BNP: BNP (last 3 results) Recent Labs    11/05/19 2228  BNP 468.7*    ProBNP (last 3 results) No results for input(s): PROBNP in the last 8760 hours.   D-Dimer No results for input(s): DDIMER in the last 72 hours. Hemoglobin A1C No results for input(s): HGBA1C in the last 72 hours. Fasting Lipid Panel No results for input(s): CHOL, HDL, LDLCALC, TRIG, CHOLHDL, LDLDIRECT in the last 72 hours. Thyroid Function Tests No results for input(s): TSH, T4TOTAL, T3FREE, THYROIDAB in the last 72 hours.  Invalid input(s): FREET3  Other results:   Imaging    No results found.   Medications:     Scheduled Medications: . amiodarone  200 mg Oral BID  . apixaban  5 mg Oral BID  . aspirin EC  81 mg Oral Daily  . bisacodyl  10 mg Oral Daily   Or  . bisacodyl  10 mg Rectal Daily  . clonazepam  0.25 mg Oral Daily  . colchicine  0.6 mg Oral BID  .  digoxin  0.125 mg Oral Daily  . docusate sodium  200 mg Oral Daily  . feeding supplement (ENSURE ENLIVE)  237 mL Oral TID WC  . furosemide  40 mg Oral Daily  . insulin aspart  0-15 Units Subcutaneous TID WC  . mouth rinse  15 mL Mouth Rinse BID  . pantoprazole  40 mg Oral Daily  . potassium chloride  20 mEq Oral BID  . sacubitril-valsartan  1 tablet Oral BID  . sodium chloride flush  3 mL Intravenous Q12H  . spironolactone  25 mg Oral Daily    Infusions: . sodium chloride      PRN Medications: ondansetron (ZOFRAN) IV, oxyCODONE, pneumococcal 23 valent vaccine, sodium chloride flush, traMADol, traZODone    Assessment/Plan   1. CAD: H/o PCI to LCX in 2017. LHC 11/05/19 with multivessel CAD, including calcified 80% distal LM stenosis, small diffusely diseased distal LAD,  chronically occluded OM1, and 50% mid RCA lesion.  He is now s/p LIMA-LAD, SVG-OM1, SVG-OM2 and SVG-PDA. - Intolerance of statins, he is on Praluent at home. - Continue ASA.  2. Acute systolic CHF: Ischemic cardiomyopathy.  Echo pre-op with EF 25-30%.  Impella 5.5 removed 9/17.  Now on milrinone at 0.125. Co-ox 61%. Diuresed well on lasix gtt, now back down to pre-op wt.   -CO-OX down to 44%. Repeat now. Doubt this is accurate.  - No bb for now. Add as an outpatient.  - Continue Entresto 24-26 mg bid. No room to titrate with soft BP.  - Continue Spiro 25 mg daily  - Volume status stable. Continue Lasix 40 mg daily  - consider future addition of Farxiga  - continue digoxin 0.125. Dig level ok (0.6) - If EF remains low after CABG, will be CRT-D candidate with LBBB.  3. PVCs: Frequent.  Now on amiodarone.  - no change 4. Paroxysmal atrial fibrillation/flutter: Went back into rapid afib overnight, back in NSR -Stop IV amio. Start amio 200 mg twice a day.  - now on apixaban.  5. Hypokalemia - resolved, K 4.7 today  - continue spiro  7. Rt Toe Pain - empirically treating for gout. - pain improving w/ colchicine    Length of Stay: [place], NP  11/20/2019, 9:15 AM  Advanced Heart Failure Team Pager [phone] (M-F; [place])  Please contact [facility] for night-coverage after hours (4p -7a ) and weekends on [email or web address]  Patient seen with NP, agree with the above note.   Feels good this morning.  Early am co-ox low at 44%, suspect inaccurate. SBP 100s-110s.  Creatinine stable 1.07.  General: NAD Neck: No JVD, no thyromegaly or thyroid nodule.  Lungs: Clear to auscultation bilaterally with normal respiratory effort. CV: Nondisplaced PMI.  Heart regular S1/S2, no S3/S4, no murmur.  No peripheral edema.   Abdomen: Soft, nontender, no hepatosplenomegaly, no distention.  Skin: Intact without lesions or rashes.  Neurologic: Alert and oriented x 3.  Psych: Normal affect. Extremities:  No clubbing or cyanosis.  HEENT: Normal.   Repeat co-ox but leave off milrinone.  Continue digoxin, Entresto, spironolactone at current doses.  No BP room to increase Entresto at this time.   Volume status looks ok on Lasix 40 mg daily.   He is in NSR, now on po amiodarone and apixaban, pacing wires out.   Loralie Champagne 11/20/2019 9:47 AM

## 2019-11-20 NOTE — Progress Notes (Signed)
Occupational Therapy Treatment Patient Details Name: [name] MRN: [id number] DOB: 1950/04/19 Today's Date: 11/20/2019    History of present illness Pt admitted 11/05/19 after cath with CAD and ICM. Pt s/p high risk CABG with impella 5.5 placement 9/13-9/17. PMH includes CAD, NSTEMI, LBBB, NICM, PVD s/p mechanical thrombectomy, HTN, HLD, hiatal hernia.   OT comments  Pt overall functioning at a supervision level in ADL. Educated pt in energy conservation strategies and provided written handout. Pt is eager to go home.   Follow Up Recommendations  No OT follow up    Equipment Recommendations  None recommended by OT    Recommendations for Other Services      Precautions / Restrictions Precautions Precautions: Sternal;Fall       Mobility Bed Mobility Overal bed mobility: Independent            General bed mobility comments: adhering to sternal precautions  Transfers Overall transfer level: Needs assistance Equipment used: None Transfers: Sit to/from Stand Sit to Stand: Supervision         General transfer comment: cues for hands on knees    Balance Overall balance assessment: No apparent balance deficits (not formally assessed) Sitting-balance support: No upper extremity supported;Feet supported Sitting balance-Leahy Scale: Good       Standing balance-Leahy Scale: Fair Standing balance comment: Can stand and walk without UE support; dynamic stability improved with at least single UE support                           ADL either performed or assessed with clinical judgement   ADL               Lower Body Bathing: Supervison/ safety;Set up;Sit to/from stand       Lower Body Dressing: Set up;Sit to/from stand;Supervision/safety                 General ADL Comments: Pt able to cross foot over opposite knee to reach feet. Recommended pt sit for showering, has a shower seat. Educated in energy conservation and pacing, reinforced with  written handout.     Vision       Perception     Praxis      Cognition Arousal/Alertness: Awake/alert Behavior During Therapy: WFL for tasks assessed/performed Overall Cognitive Status: Within Functional Limits for tasks assessed                                 General Comments: pt recalling sternal precautions        Exercises     Shoulder Instructions       General Comments Pt reports planning to continue use of RW (wife purchased yesterday); reviewed and reinforced sternal precautions, activity recommendations    Pertinent Vitals/ Pain       Pain Assessment: Faces Faces Pain Scale: Hurts a little bit Pain Location: generalized Pain Descriptors / Indicators: Tiring Pain Intervention(s): Monitored during session;Repositioned  Home Living                                          Prior Functioning/Environment              Frequency  Min 2X/week        Progress Toward Goals  OT Goals(current goals can now be  found in the care plan section)  Progress towards OT goals: Progressing toward goals  Acute Rehab OT Goals Patient Stated Goal: return to golf, dancing OT Goal Formulation: With patient Time For Goal Achievement: 11/28/19 Potential to Achieve Goals: Good  Plan Discharge plan needs to be updated    Co-evaluation                 AM-PAC OT "6 Clicks" Daily Activity     Outcome Measure   Help from another person eating meals?: None Help from another person taking care of personal grooming?: A Little Help from another person toileting, which includes using toliet, bedpan, or urinal?: A Little Help from another person bathing (including washing, rinsing, drying)?: A Little Help from another person to put on and taking off regular upper body clothing?: None Help from another person to put on and taking off regular lower body clothing?: A Little 6 Click Score: 20    End of Session Equipment Utilized During  Treatment: Gait belt  OT Visit Diagnosis: Unsteadiness on feet (R26.81);Pain;Other symptoms and signs involving cognitive function;Muscle weakness (generalized) (M62.81)   Activity Tolerance Patient tolerated treatment well;Treatment limited secondary to medical complications (Comment)   Patient Left in bed;with call bell/phone within reach   Nurse Communication          Time: [phone] OT Time Calculation (min): 18 min  Charges: OT General Charges $OT Visit: 1 Visit OT Treatments $Self Care/Home Management : 8-22 mins   Malka So 11/20/2019, 11:16 AM  Nestor Lewandowsky, [place] Pager: [phone] Office: [phone]

## 2019-11-20 NOTE — Progress Notes (Addendum)
[facility] DAILY ICU PROGRESS NOTE                   [address]            [place] [address]          [phone]   5 Days Post-Op Procedure(s) (LRB): REMOVAL OF IMPELLA 5.5 LEFT VENTRICULAR ASSIST DEVICE (N/A) TRANSESOPHAGEAL ECHOCARDIOGRAM (TEE) (N/A)  Total Length of Stay:  LOS: 15 days   Subjective: Up in the bedside chair, says he feels good, no complaints or new concerns.  He has eaten breakfast and been up to the bathroom this morning.    IV amiodarone infusing.   Remains on RA.   Wt 1kg below pre-op.   Objective: Vital signs in last 24 hours: Temp:  [97.8 F (36.6 C)-98.6 F (37 C)] 98.2 F (36.8 C) (09/22 0731) Pulse Rate:  [56-77] 60 (09/22 0731) Cardiac Rhythm: Normal sinus rhythm;Bundle branch block;Heart block (09/21 1900) Resp:  [13-22] 17 (09/22 0731) BP: (91-129)/(50-76) 91/55 (09/22 0731) SpO2:  [91 %-99 %] 93 % (09/22 0731) Weight:  [78.6 kg] 78.6 kg (09/22 0433)  Filed Weights   11/18/19 0500 11/19/19 0500 11/20/19 0433  Weight: 79.6 kg 79.5 kg 78.6 kg    Weight change: -0.892 kg   Hemodynamic parameters for last 24 hours: CVP:  [13 mmHg] 13 mmHg  Intake/Output from previous day: 09/21 0701 - 09/22 0700 In: 825.7 [P.O.:660; I.V.:165.7] Out: 1775 [Urine:1775]  Intake/Output this shift: No intake/output data recorded.  Current Meds: Scheduled Meds: . amiodarone  200 mg Oral BID  . apixaban  5 mg Oral BID  . aspirin EC  81 mg Oral Daily  . bisacodyl  10 mg Oral Daily   Or  . bisacodyl  10 mg Rectal Daily  . clonazepam  0.25 mg Oral Daily  . colchicine  0.6 mg Oral BID  . digoxin  0.125 mg Oral Daily  . docusate sodium  200 mg Oral Daily  . feeding supplement (ENSURE ENLIVE)  237 mL Oral TID WC  . furosemide  40 mg Oral Daily  . insulin aspart  0-15 Units Subcutaneous TID WC  . mouth rinse  15 mL Mouth Rinse BID  . pantoprazole  40 mg Oral Daily  . potassium chloride  20 mEq Oral BID  . sacubitril-valsartan  1  tablet Oral BID  . sodium chloride flush  10-40 mL Intracatheter Q12H  . sodium chloride flush  3 mL Intravenous Q12H  . spironolactone  25 mg Oral Daily   Continuous Infusions: . sodium chloride     PRN Meds:.ondansetron (ZOFRAN) IV, oxyCODONE, pneumococcal 23 valent vaccine, sodium chloride flush, sodium chloride flush, traMADol, traZODone  General appearance: alert, cooperative and no distress Neurologic: intact Heart: SR, no further a-fib recorded. Lungs: clear to auscultation bilaterally. CXR shows low lung volumes but clear lung fields, no effusions, no PTX Abdomen: Soft and non-tender. ("large" BM this morning) Extremities: Warm and well perfused, no peripheral edema. Wound: the sternotomy, right neck,  and RLE EVH incisions are all intact and healing well.   Lab Results: CBC: Recent Labs    11/19/19 0312 11/20/19 0500  WBC 7.4 8.8  HGB 9.3* 10.3*  HCT 28.5* 32.2*  PLT 285 399   BMET:  Recent Labs    11/19/19 0312 11/20/19 0500  NA 139 136  K 4.4 4.7  CL 99 97*  CO2 30 28  GLUCOSE 116* 104*  BUN 18 20  CREATININE 0.98  1.07  CALCIUM 9.0 9.1    CMET: Lab Results  Component Value Date   WBC 8.8 11/20/2019   HGB 10.3 (L) 11/20/2019   HCT 32.2 (L) 11/20/2019   PLT 399 11/20/2019   GLUCOSE 104 (H) 11/20/2019   CHOL 154 10/24/2019   TRIG 120 10/24/2019   HDL 61 10/24/2019   LDLCALC 69 10/24/2019   ALT 16 11/18/2019   AST 14 (L) 11/18/2019   NA 136 11/20/2019   K 4.7 11/20/2019   CL 97 (L) 11/20/2019   CREATININE 1.07 11/20/2019   BUN 20 11/20/2019   CO2 28 11/20/2019   TSH 3.303 11/07/2019   PSA 0.83 06/15/2015   INR 1.4 (H) 11/11/2019   HGBA1C 5.6 11/07/2019      PT/INR: No results for input(s): LABPROT, INR in the last 72 hours. Radiology: No results found.   Assessment/Plan: S/P Procedure(s) (LRB): REMOVAL OF IMPELLA 5.5 LEFT VENTRICULAR ASSIST DEVICE (N/A) TRANSESOPHAGEAL ECHOCARDIOGRAM (TEE) (N/A)  -POD 9 CABG x 4 and placement of  Impella 5.5 for low EF. POD-5 removal of Impella.  Continue working on PT.   -Post-op atrial fibrillation- SR past 24 hours on amiodarone infusion, converting t oral amio today. Eliquis to begin today.  -Volume excess- resolved, now below pre-op weight.   -DVT PPX- continue with enoxaparin.  -Disposition- anticipate discharge to home later this week. He has a walker and BSC at home.    [name]  [phone] 11/20/2019 7:41 AM  Patient feels great this morning and is maintaining sinus rhythm. Initial coox was low [patient's heart rate was also low] and will repeat. He is now on Eliquis for his postoperative atrial fibrillation. Should be ready for discharge after heart failure cardiac meds are titrated by cardiology.  patient examined and medical record reviewed,agree with above note. [name] 11/20/2019

## 2019-11-21 LAB — BASIC METABOLIC PANEL
Anion gap: 11 (ref 5–15)
BUN: 23 mg/dL (ref 8–23)
CO2: 27 mmol/L (ref 22–32)
Calcium: 9.1 mg/dL (ref 8.9–10.3)
Chloride: 97 mmol/L — ABNORMAL LOW (ref 98–111)
Creatinine, Ser: 1.13 mg/dL (ref 0.61–1.24)
GFR calc Af Amer: >60 mL/min (ref >60)
GFR calc non Af Amer: >60 mL/min (ref >60)
Glucose, Bld: 108 mg/dL — ABNORMAL HIGH (ref 70–99)
Potassium: 4.6 mmol/L (ref 3.5–5.1)
Sodium: 135 mmol/L (ref 135–145)

## 2019-11-21 LAB — CBC
HCT: 34.6 % — ABNORMAL LOW (ref 39.0–52.0)
Hemoglobin: 10.8 g/dL — ABNORMAL LOW (ref 13.0–17.0)
MCH: 29.3 pg (ref 26.0–34.0)
MCHC: 31.2 g/dL (ref 30.0–36.0)
MCV: 94 fL (ref 80.0–100.0)
Platelets: 495 10*3/uL — ABNORMAL HIGH (ref 150–400)
RBC: 3.68 MIL/uL — ABNORMAL LOW (ref 4.22–5.81)
RDW: 13.2 % (ref 11.5–15.5)
WBC: 10.2 10*3/uL (ref 4.0–10.5)
nRBC: 0 % (ref 0.0–0.2)

## 2019-11-21 LAB — COOXEMETRY PANEL
Carboxyhemoglobin: 2 % — ABNORMAL HIGH (ref 0.5–1.5)
Methemoglobin: 1.3 % (ref 0.0–1.5)
O2 Saturation: 52.4 %
Total hemoglobin: 11.3 g/dL — ABNORMAL LOW (ref 12.0–16.0)

## 2019-11-21 LAB — GLUCOSE, CAPILLARY: Glucose-Capillary: 99 mg/dL (ref 70–99)

## 2019-11-21 NOTE — Discharge Summary (Addendum)
Physician Discharge Summary  Patient ID: [name] MRN: [id number] DOB/AGE: April 17, 1950 69 y.o.  Admit date: 11/05/2019 Discharge date: 11/21/2019  Admission Diagnoses:    CAD, multiple vessel   History of CEA (carotid endarterectomy)   Hyperlipidemia LDL goal <70   Essential hypertension   History of tobacco use   Overweight (BMI 25.0-29.9)   Chronic systolic heart failure (HCC)   Carotid stenosis   Left bundle branch block    Discharge Diagnoses:     CAD, multiple vessel   Left ventricular dysfunction   History of CEA (carotid endarterectomy)   Hyperlipidemia LDL goal <70   Essential hypertension   History of tobacco use   Overweight (BMI 25.0-29.9)   Acute on Chronic systolic heart failure (HCC)   Ischemic cardiomyopathy   Carotid stenosis   Left bundle branch block   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  A stress test was positive.  Cardiac catheterization by Dr. Saunders Revel demonstrated 80% distal left main stenosis with severe three-vessel diabetic pattern of disease.  LVEF is 30% with LVEDP of 30.  He is transferred to [facility] for surgical evaluation. An echocardiogram confirmed left ventricular ejection fraction of 25-30% and no significant aortic or mitral valvular disease.  The patient stabilized with medical therapy and diuresis which was started because of an elevated wedge pressure.  Heart Failure  Cardiology assessed the patient and monitored his mixed venous saturation and titrated his preoperative medications.  Dr. Darcey Nora discussed the procedure of high-risk CABG with the patient, including the potential for needing temporary  percutaneous Impella LVAD support.  He understood the benefits to include improved [place]-term survival and improved symptoms and exercise tolerance.  He understood the risks to include stroke, bleeding, infection, organ failure, postoperative pulmonary problems, infection, and death.  He demonstrated his understanding and agreed to  proceed with surgery.   The patient's last surgical procedure was on his left hip and he had no anesthetic or cardiac complications.  He stopped smoking over 10 years ago.  He has been on [place]-term Plavix for the left SFA stent performed 2020   Hospital Course: After [name] had been medically stabilized and had been given sufficient time for Plavix washout, he was taken to the OR where CABG x 4 and placement of an Impella 5.5 LV assist device were carried out. He had a significant coagulopathy following the procedure which was corrected with transfusions on platelets and fresh frozen plasma. He remained sedated and hemodynamically stable on epinephrine and milrinone infusions and was also started on heparin infusion for the LV assist device.   He was weaned from mechanical vent support slowly and was extubated on the morning of the first post-operative day. The advanced heart failure cardiology team followed the patient closely post operatively. He was transfused with additional PRBC's on post-op day 1 for Hct of 23%. The inotropic support and mechanical left ventricular support were slowly weaned over the course of several days and this was well tolerated. The PA catheter was removed on POD 2 and he was mobilized with assistance from physical therapy. He developed atrial fibrillation with RVR on the evening of POD2 and was started on amiodarone. He converted back to  SR by the following day. His hemodynamics stabilized sufficiently to allow removal of the Impella LVAD on post-op day 4.  This was acomplished in the OR without complication and he was returned to the ICU. He was  then diuresed aggressively with a Lasix drip. He was also started on Entresto by the heart failure team. His hemodynamics gradually improved. He was weaned off of the milrinone infusion by 11/19/19 while monitoring the CoOx daily. The pacer wires were removed on 9/21 and he was started on apixaban on the following day. The amiodarone infusion was converted to oral amiodarone on 9/22. He remained in SR. He progressed to independent mobility and was maintaining adequate O2 saturation on room air.     Consults:  Advanced Heart Failure  Significant Diagnostic Studies:   RIGHT/LEFT HEART CATH AND CORONARY ANGIOGRAPHY  Conclusion  Conclusions: Multivessel CAD, including calcified 80% distal LMCA stenosis, small diffusely diseased distal LAD, chronically occluded OM1, and 50% mid RCA lesion. Widely patent mid LCx stent. Moderately elevated left heart filling pressure. Mildly to moderately elevated right heart filling pressure. Normal to mildly reduced cardiac output/index.   Recommendations: Transfer to [facility] for cardiac surgery consultation for CABG, given severe LMCA disease and low LVEF. Hold clopidogrel pending cardiac surgery consultation. Obtain echocardiogram. Aggressive secondary prevention.   Nelva Bush, MD [facility]   Recommendations  Antiplatelet/Anticoag Hold clopidogrel pending cardiac surgery evaluation.  Continue indefinite aspirin 81 mg daily.  Discharge Date Anticipated discharge date to be determined.  Surgeon Notes    11/16/2019 11:24 AM Operative Note signed by Ivin Poot, MD    11/15/2019  8:45 AM Brief Op Note signed by Ivin Poot, MD    11/13/2019  5:09 PM Operative Note signed by Ivin Poot, MD    11/11/2019  5:08 PM Brief Op Note signed by Ivin Poot, MD    11/11/2019  4:05 PM Operative Note - Scan signed by Default, Provider, MD  Indications  Coronary artery disease with cardiac symptoms [I25.10, R09.89 (ICD-10-CM)]    Shortness of breath [R06.02 (ICD-10-CM)]  Accelerating angina ([place]) [I20.0 (ICD-10-CM)]  Procedural Details  Technical Details Indication: 69 y.o. year-old man with history of coronary artery disease with NSTEMI and PCI to LCx in 07/2015, nonischemic cardiomyopathy, PAD, hypertension, hyperlipidemia, carotid artery stenosis, hiatal hernia, and peptic ulcer disease, presenting for evaluation of worsening shortness of breath.  He has a LBBB at baseline, though recent EKG in the office showed absence of BBB with marked anterior T-wave inversions.  GFR: >60 ml/min  Procedure: The risks, benefits, complications, treatment options, and expected outcomes were discussed with the patient. The patient and/or family concurred with the proposed plan, giving informed consent. The patient was sedated with IV midazolam and fentanyl. The right wrist was assessed with a modified Allens test which was normal. The right wrist and elbow were prepped and draped in a sterile fashion. 1% lidocaine was used for local anesthesia. A previously placed antecubital vein IV was exchanged for a 138F slender Glidesheath using modified Seldinger technique. Right heart catheterization was attempted but the catheter could not be advanced from the cephalic vein into the brachiocephalic vein.  Therefore, ultrasound was used to evaluate the right basilic vein. It was patent.  A micropuncture needle was used to access the right basilic veing under ultrasound guidance.  A 138F slender Glidesheath as placed in the basilic vein using modified Seldinger technique.  Right heart catheterization was performed by advancing a 138F balloon-tipped catheter through  the right heart chambers into the pulmonary capillary wedge position. Pressure measurements and oxygen saturations were obtained.  Thermodilution measurements were also performed.  Using the modified Seldinger access technique, a 62F slender Glidesheath was placed in the right radial artery. 3 mg  Verapamil was given through the sheath. Heparin 4,000 units were administered.  Selective coronary angiography was performed using a 1F JL3.5 catheter to engage the left coronary artery and a 1F JR4 catheter to engage the right coronary artery. Left heart catheterization was performed using a 1F JR4 catheter. Left ventriculogram was performed with a hand injection of contrast.  At the end of the procedure, the radial artery sheath was removed and a TR band applied to achieve patent hemostasis. There were no immediate complications. The patient was taken to the recovery area in stable condition.  Estimated blood loss <50 mL.   During this procedure medications were administered to achieve and maintain moderate conscious sedation while the patient's heart rate, blood pressure, and oxygen saturation were continuously monitored and I was present face-to-face 100% of this time.  Medications (Filter: Administrations occurring from 0919 to 1039 on 11/05/19)  (important)  Continuous medications are totaled by the amount administered until 11/05/19 1039.  Heparin (Porcine) in NaCl 1000-0.9 UT/500ML-% SOLN (mL) Total volume:  1,000 mL  Date/Time  Rate/Dose/Volume Action  11/05/19 0934  1,000 mL Given    midazolam (VERSED) injection (mg) Total dose:  1 mg  Date/Time  Rate/Dose/Volume Action  11/05/19 0935  1 mg Given    fentaNYL (SUBLIMAZE) injection (mcg) Total dose:  25 mcg  Date/Time  Rate/Dose/Volume Action  11/05/19 0935  25 mcg Given    verapamil (ISOPTIN) injection (mg) Total dose:  2.5 mg  Date/Time  Rate/Dose/Volume Action  11/05/19 1008  2.5 mg Given    heparin sodium (porcine) injection (Units) Total dose:  4,000 Units  Date/Time  Rate/Dose/Volume Action  11/05/19 1011  4,000 Units Given    iohexol (OMNIPAQUE) 300 MG/ML solution (mL) Total volume:  70 mL  Date/Time  Rate/Dose/Volume Action  11/05/19 1029  70 mL Given    Sedation Time  Sedation Time Physician-1: 50  minutes 41 seconds  Contrast  Medication Name Total Dose  iohexol (OMNIPAQUE) 300 MG/ML solution 70 mL    Radiation/Fluoro  Fluoro time: 8.1 (min) DAP: 21.7 (Gycm2) Cumulative Air Kerma: 694 (mGy)  Complications  Complications documented before study signed (11/05/2019  8:54 PM)   No complications were associated with this study.  Documented by Nelva Bush, MD - 11/05/2019 12:59 PM    Coronary Findings  Diagnostic Dominance: Right Left Main  Vessel is large.  Dist LM to Ost LAD lesion is 80% stenosed. The lesion is calcified.  Left Anterior Descending  Vessel is moderate in size.  First Diagonal Branch  Vessel is small in size.  Second Diagonal Branch  Vessel is moderate in size.  Left Circumflex  Previously placed Mid Cx stent (unknown type) is widely patent.  First Obtuse Marginal Branch  Vessel is moderate in size.  Collaterals  1st Mrg filled by collaterals from Dist LAD.    1st Mrg lesion is 100% stenosed. The lesion is chronically occluded with left-to-left collateral flow.  Second Obtuse Marginal Branch  Vessel is small in size.  Third Obtuse Marginal Branch  Vessel is moderate in size.  Fourth Obtuse Marginal Branch  Vessel is moderate in size.  Right Coronary Artery  Vessel is large.  Mid RCA lesion is 50% stenosed. The lesion is eccentric.  Intervention  No interventions have been documented. Right Heart  Right Heart Pressures RA (mean): 12 mmHg RV (S/EDP): 45/12 mmHg PA (S/D, mean): 45/25 mmHg PCWP (mean): 25 mmHg  Ao sat: 95% PA sat: 67%  Fick CO: 3.9 L/min Fick CI: 2.1 L/min/m^2  Thermodilution CO: 5.2 L/min Thermodilution CI: 2.7 L/min/m^2  Wall Motion  Resting               Left Heart  Left Ventricle There is severe left ventricular systolic dysfunction. LV end diastolic pressure is moderately elevated. LVEDP 25-30 mmHg. The left ventricular ejection fraction is 25-35% by visual estimate. There are LV function abnormalities.    Aortic Valve There is no aortic valve stenosis.  Coronary Diagrams  Diagnostic Dominance: Right    Treatments:   OPERATIVE REPORT   DATE OF PROCEDURE:  11/15/2019   OPERATION:  Removal of Impella 5.5 LVAD.   SURGEON:  Ivin Poot, MD   ANESTHESIA:  General.   PREOPERATIVE DIAGNOSIS:  Ischemic cardiomyopathy, status post coronary artery bypass graft x4 with postoperative cardiac support with Impella 5.5 catheter placed through a 10 mm graft in the ascending aorta.   POSTOPERATIVE DIAGNOSIS:  Ischemic cardiomyopathy, status post coronary artery bypass graft x4 with postoperative cardiac support with Impella 5.5 catheter placed through a 10 mm graft in the ascending aorta.  OPERATIVE FINDINGS: 1.  Severe diffuse coronary disease with somewhat small coronary targets. 2.  Significant atherosclerotic and atheroma of the ascending aorta and aortic arch. 3.  Placement of a direct transaortic Impella LVAD for mechanical support to separate from cardiopulmonary bypass.     ECHOCARDIOGRAM REPORT        Patient Name:   [name] Date of Exam: 11/06/2019  Medical Rec #:  [id number]    Height:       66.0 in  Accession #:    [id number]   Weight:       174.8 lb  Date of Birth:  1950-05-12   BSA:          1.889 m  Patient Age:    48 years     BP:           123/75 mmHg  Patient Gender: M            HR:           69 bpm.  Exam Location:  Inpatient   Procedure: 2D Echo   Indications:    786.50 chest pain     History:        Patient has prior history of Echocardiogram examinations,  most                  recent 02/27/2018. Previous Myocardial Infarction; Risk                  Factors:Hypertension and Dyslipidemia. Unstable angina.     Sonographer:    [name] RDCS (AE)  Referring Phys: [id number] Evalyse Stroope VAN TRIGT      Sonographer Comments: No parasternal window, suboptimal subcostal window  and suboptimal apical window.  IMPRESSIONS     1. Poor quality images no  parasternal/subcostal images.   2. Septal apical and inferior basal akinesis hypokinesis of mid and  apical inferior wall. Left ventricular ejection fraction, by estimation,  is 25 to 30%. The left ventricle has severely decreased function. The left  ventricle demonstrates regional wall  motion abnormalities (see scoring diagram/findings for description). The  left ventricular internal cavity size  was severely dilated. Left  ventricular diastolic parameters were normal.   3. Right ventricular systolic function is normal. The right ventricular  size is normal.   4. The mitral valve is normal in structure. Mild mitral valve  regurgitation. No evidence of mitral stenosis.   5. The aortic valve was not well visualized. Aortic valve regurgitation  is not visualized. Mild aortic valve sclerosis is present, with no  evidence of aortic valve stenosis.   6. The inferior vena cava is normal in size with greater than 50%  respiratory variability, suggesting right atrial pressure of 3 mmHg.   FINDINGS   Left Ventricle: Septal apical and inferior basal akinesis hypokinesis of  mid and apical inferior wall. Left ventricular ejection fraction, by  estimation, is 25 to 30%. The left ventricle has severely decreased  function. The left ventricle demonstrates  regional wall motion abnormalities. The left ventricular internal cavity  size was severely dilated. There is no left ventricular hypertrophy. Left  ventricular diastolic parameters were normal.   Right Ventricle: The right ventricular size is normal. No increase in  right ventricular wall thickness. Right ventricular systolic function is  normal.   Left Atrium: Left atrial size was normal in size.   Right Atrium: Right atrial size was normal in size.   Pericardium: There is no evidence of pericardial effusion.   Mitral Valve: The mitral valve is normal in structure. There is moderate  thickening of the mitral valve leaflet(s). There is  mild calcification of  the mitral valve leaflet(s). Mild mitral valve regurgitation. No evidence  of mitral valve stenosis.   Tricuspid Valve: The tricuspid valve is not well visualized. Tricuspid  valve regurgitation is mild . No evidence of tricuspid stenosis.   Aortic Valve: The aortic valve was not well visualized. Aortic valve  regurgitation is not visualized. Mild aortic valve sclerosis is present,  with no evidence of aortic valve stenosis.   Pulmonic Valve: The pulmonic valve was not well visualized. Pulmonic valve  regurgitation is not visualized. No evidence of pulmonic stenosis.   Aorta: The aortic root is normal in size and structure.   Venous: The inferior vena cava is normal in size with greater than 50%  respiratory variability, suggesting right atrial pressure of 3 mmHg.   IAS/Shunts: No atrial level shunt detected by color flow Doppler.   Additional Comments: Poor quality images no parasternal/subcostal images.         Diastology  LV e' medial:    4.13 cm/s  LV E/e' medial:  16.0  LV e' lateral:   6.96 cm/s  LV E/e' lateral: 9.5      RIGHT VENTRICLE  RV S prime:     12.80 cm/s  TAPSE (M-mode): 2.3 cm   LEFT ATRIUM             Index  LA Vol (A2C):   26.0 ml 13.77 ml/m  LA Vol (A4C):   44.6 ml 23.61 ml/m  LA Biplane Vol: 34.4 ml 18.21 ml/m   AORTIC VALVE  LVOT Vmax:   88.80 cm/s  LVOT Vmean:  55.300 cm/s  LVOT VTI:    0.192 m   MITRAL VALVE  MV Area (PHT): 2.37 cm    SHUNTS  MV Decel Time: 320 msec    Systemic VTI: 0.19 m  MV E velocity: 66.00 cm/s  MV A velocity: 76.70 cm/s  MV E/A ratio:  0.86   Jenkins Rouge MD  Electronically signed by Jenkins Rouge MD  Signature Date/Time:  11/06/2019/4:47:48 PM      OPERATIVE REPORT   DATE OF PROCEDURE:  11/15/2019   OPERATION:  Removal of Impella 5.5 LVAD.   SURGEON:  Ivin Poot, MD   ANESTHESIA:  General.   PREOPERATIVE DIAGNOSIS:  Ischemic cardiomyopathy, status post coronary artery bypass  graft x4 with postoperative cardiac support with Impella 5.5 catheter placed through a 10 mm graft in the ascending aorta.   POSTOPERATIVE DIAGNOSIS:  Ischemic cardiomyopathy, status post coronary artery bypass graft x4 with postoperative cardiac support with Impella 5.5 catheter placed through a 10 mm graft in the ascending aorta.    Discharge Exam: Blood pressure 98/62, pulse 70, temperature 98.8 F (37.1 C), temperature source Oral, resp. rate 18, height 5\' 6"  (1.676 m), weight 76.1 kg, SpO2 94 %.   General appearance: alert, cooperative and no distress Neurologic: intact Heart: SR, no further a-fib  Lungs: clear to auscultation bilaterally. Good sats on RA.  Abdomen: Soft and non-tender. Extremities: Warm and well perfused, no peripheral edema. Wound: the sternotomy, right neck,  and RLE EVH incisions are all intact and healing well.  Disposition:      Allergies as of 11/21/2019       Reactions   Brilinta [ticagrelor] Shortness Of Breath   Chlorhexidine Gluconate Other (See Comments)   Skin burning for hours afterward   Statins Other (See Comments)   Failed Crestor 5 mg twice weekly, Crestor 20 mg daily, Pravastatin 40 mg qd, Lipitor, Zocor - muscle aches   Zetia [ezetimibe] Other (See Comments)   Muscle aches        Medication List     STOP taking these medications    acetaminophen 500 MG tablet Commonly known as: TYLENOL   amLODipine 5 MG tablet Commonly known as: NORVASC   clopidogrel 75 MG tablet Commonly known as: PLAVIX   cyclobenzaprine 10 MG tablet Commonly known as: FLEXERIL   lisinopril 20 MG tablet Commonly known as: ZESTRIL   meloxicam 7.5 MG tablet Commonly known as: MOBIC   metoprolol succinate 25 MG 24 hr tablet Commonly known as: TOPROL-XL   naproxen sodium 220 MG tablet Commonly known as: ALEVE       TAKE these medications    amiodarone 200 MG tablet Commonly known as: PACERONE Take 1 tablet (200 mg total) by mouth 2 (two)  times daily. For 10 days then decrease to 1 tablet (200mg ) by mouth once daily   apixaban 5 MG Tabs tablet Commonly known as: ELIQUIS Take 1 tablet (5 mg total) by mouth 2 (two) times daily.   aspirin EC 81 MG tablet Take 1 tablet (81 mg total) by mouth daily.   clonazePAM 0.25 MG disintegrating tablet Commonly known as: KLONOPIN Take 1 tablet (0.25 mg total) by mouth daily.   colchicine 0.6 MG tablet Take 1 tablet (0.6 mg total) by mouth 2 (two) times daily.   digoxin 0.125 MG tablet Commonly known as: LANOXIN Take 1 tablet (0.125 mg total) by mouth daily.   furosemide 40 MG tablet Commonly known as: LASIX Take 1 tablet (40 mg total) by mouth daily. What changed:  medication strength how much to take how to take this when to take this additional instructions   nitroGLYCERIN 0.4 MG SL tablet Commonly known as: Nitrostat Place 1 tablet (0.4 mg total) under the tongue every 5 (five) minutes as needed for chest pain.   oxyCODONE 5 MG immediate release tablet Commonly known as: Oxy IR/ROXICODONE Take 1 tablet (5 mg total) by mouth every  4 (four) hours as needed for up to 5 days for severe pain.   pantoprazole 40 MG tablet Commonly known as: PROTONIX Take 1 tablet (40 mg total) by mouth daily.   potassium chloride SA 20 MEQ tablet Commonly known as: KLOR-CON Take 1 tablet (20 mEq total) by mouth 2 (two) times daily.   Praluent 150 MG/ML Soaj Generic drug: Alirocumab Inject 1 pen into the skin every 14 (fourteen) days.   sacubitril-valsartan 24-26 MG Commonly known as: ENTRESTO Take 1 tablet by mouth 2 (two) times daily.   spironolactone 25 MG tablet Commonly known as: ALDACTONE Take 1 tablet (25 mg total) by mouth daily.   triamcinolone cream 0.1 % Commonly known as: KENALOG Apply 1 application topically 2 (two) times daily as needed for itching.        Follow-up Information     [facility] Follow up on  12/05/2019.   Specialty: Cardiology Why: [place] [address] Contact information: [address] [id number] [place]        Loel Dubonnet, NP. Go on 12/04/2019.   Specialty: Cardiology Why: Your appointment is at [place] information: [address] [place] [address] [phone]         Ivin Poot, MD. Go on 12/18/2019.   Specialty: Cardiothoracic Surgery Why: Your appontment is on Wednesday, 12/18/19 at 2pm.  Please arrive 30 minutes early for a chest x-ray to be performed by [facility] located on the first floor of the same building. Contact information: [address] [place] [address] [phone]         [facility] [place]. Call.   Specialty: Cardiothoracic Surgery Why: The office will call with appointment instructions for suture removal next week.  Contact information: [place], [place] [phone]                Signed: Antony Odea, PA-C 11/21/2019, 8:24 AM   patient examined and medical record reviewed,agree with above note. [name] 11/22/2019

## 2019-11-21 NOTE — Discharge Instructions (Signed)
Discharge Instructions:  1. You may shower, please wash incisions daily with soap and water and keep dry.  If you wish to cover wounds with dressing you may do so but please keep clean and change daily.  No tub baths or swimming until incisions have completely healed.  If your incisions become red or develop any drainage please call our office at [phone]  2. No Driving until cleared by Dr. Thayer Ohm office and you are no longer using narcotic pain medications  3. Monitor your weight daily.. Please use the same scale and weigh at same time... If you gain 5-10 lbs in 48 hours with associated lower extremity swelling, please contact our office at [phone]  4. Fever of 101.5 for at least 24 hours with no source, please contact our office at [phone]  5. Activity- up as tolerated, please walk at least 3 times per day.  Avoid strenuous activity, no lifting, pushing, or pulling with your arms over 8-10 lbs for a minimum of 6 weeks  6. If any questions or concerns arise, please do not hesitate to contact our office at [phone]      Apixaban oral tablets What is this medicine? APIXABAN (a PIX a ban) is an anticoagulant (blood thinner). It is used to lower the chance of stroke in people with a medical condition called atrial fibrillation. It is also used to treat or prevent blood clots in the lungs or in the veins. This medicine may be used for other purposes; ask your health care provider or pharmacist if you have questions. COMMON BRAND NAME(S): Eliquis What should I tell my health care provider before I take this medicine? They need to know if you have any of these conditions:  antiphospholipid antibody syndrome  bleeding disorders  bleeding in the brain  blood in your stools (black or tarry stools) or if you have blood in your vomit  history of blood clots  history of stomach bleeding  kidney disease  liver disease  mechanical heart valve  an unusual or  allergic reaction to apixaban, other medicines, foods, dyes, or preservatives  pregnant or trying to get pregnant  breast-feeding How should I use this medicine? Take this medicine by mouth with a glass of water. Follow the directions on the prescription label. You can take it with or without food. If it upsets your stomach, take it with food. Take your medicine at regular intervals. Do not take it more often than directed. Do not stop taking except on your doctor's advice. Stopping this medicine may increase your risk of a blood clot. Be sure to refill your prescription before you run out of medicine. Talk to your pediatrician regarding the use of this medicine in children. Special care may be needed. Overdosage: If you think you have taken too much of this medicine contact a poison control center or emergency room at once. NOTE: This medicine is only for you. Do not share this medicine with others. What if I miss a dose? If you miss a dose, take it as soon as you can. If it is almost time for your next dose, take only that dose. Do not take double or extra doses. What may interact with this medicine? This medicine may interact with the following:  aspirin and aspirin-like medicines  certain medicines for fungal infections like ketoconazole and itraconazole  certain medicines for seizures like carbamazepine and phenytoin  certain medicines that treat or prevent blood clots like warfarin, enoxaparin, and dalteparin  clarithromycin  NSAIDs,  medicines for pain and inflammation, like ibuprofen or naproxen  rifampin  ritonavir  St. John's wort This list may not describe all possible interactions. Give your health care provider a list of all the medicines, herbs, non-prescription drugs, or dietary supplements you use. Also tell them if you smoke, drink alcohol, or use illegal drugs. Some items may interact with your medicine. What should I watch for while using this medicine? Visit your  healthcare professional for regular checks on your progress. You may need blood work done while you are taking this medicine. Your condition will be monitored carefully while you are receiving this medicine. It is important not to miss any appointments. Avoid sports and activities that might cause injury while you are using this medicine. Severe falls or injuries can cause unseen bleeding. Be careful when using sharp tools or knives. Consider using an Copy. Take special care brushing or flossing your teeth. Report any injuries, bruising, or red spots on the skin to your healthcare professional. If you are going to need surgery or other procedure, tell your healthcare professional that you are taking this medicine. Wear a medical ID bracelet or chain. Carry a card that describes your disease and details of your medicine and dosage times. What side effects may I notice from receiving this medicine? Side effects that you should report to your doctor or health care professional as soon as possible:  allergic reactions like skin rash, itching or hives, swelling of the face, lips, or tongue  signs and symptoms of bleeding such as bloody or black, tarry stools; red or dark-brown urine; spitting up blood or brown material that looks like coffee grounds; red spots on the skin; unusual bruising or bleeding from the eye, gums, or nose  signs and symptoms of a blood clot such as chest pain; shortness of breath; pain, swelling, or warmth in the leg  signs and symptoms of a stroke such as changes in vision; confusion; trouble speaking or understanding; severe headaches; sudden numbness or weakness of the face, arm or leg; trouble walking; dizziness; loss of coordination This list may not describe all possible side effects. Call your doctor for medical advice about side effects. You may report side effects to FDA at 1-800-FDA-1088. Where should I keep my medicine? Keep out of the reach of children. Store  at room temperature between 20 and 25 degrees C (68 and 77 degrees F). Throw away any unused medicine after the expiration date. NOTE: This sheet is a summary. It may not cover all possible information. If you have questions about this medicine, talk to your doctor, pharmacist, or health care provider.  2020 Elsevier/Gold Standard (2017-10-25 17:39:34)

## 2019-11-21 NOTE — Progress Notes (Signed)
CARDIAC REHAB PHASE I   Discussed sternal precautions, IS, daily wts, diet, exercise, and CRPII. Pt and wife receptive. Will refer to [place].   [id number]  [place], ACSM 11/21/2019 10:34 AM

## 2019-11-21 NOTE — Progress Notes (Addendum)
Patient ID: [name], male   DOB: Jul 04, 1950, 69 y.o.   MRN: [id number]     Advanced Heart Failure Rounding Note  PCP-Cardiologist: Nelva Bush, MD   Subjective:   Wants to go home. Denies SOB.   Objective:   Weight Range: 76.1 kg Body mass index is 27.08 kg/m.   Vital Signs:   Temp:  [97.7 F (36.5 C)-98.8 F (37.1 C)] 98.8 F (37.1 C) (09/23 0806) Pulse Rate:  [58-73] 70 (09/23 0806) Resp:  [14-18] 18 (09/23 0806) BP: (90-116)/(59-67) 98/62 (09/23 0806) SpO2:  [92 %-96 %] 94 % (09/23 0806) Weight:  [76.1 kg] 76.1 kg (09/23 0450) Last BM Date: 11/20/19  Weight change: Filed Weights   11/19/19 0500 11/20/19 0433 11/21/19 0450  Weight: 79.5 kg 78.6 kg 76.1 kg    Intake/Output:   Intake/Output Summary (Last 24 hours) at 11/21/2019 0815 Last data filed at 11/21/2019 0808 Gross per 24 hour  Intake 129 ml  Output 1900 ml  Net -1771 ml      Physical Exam  General: Sitting in the chair. No resp difficulty HEENT: normal Neck: supple. no JVD. Carotids 2+ bilat; no bruits. No lymphadenopathy or thryomegaly appreciated. Cor: PMI nondisplaced. Regular rate & rhythm. No rubs, gallops or murmurs. Lungs: clear Abdomen: soft, nontender, nondistended. No hepatosplenomegaly. No bruits or masses. Good bowel sounds. Extremities: no cyanosis, clubbing, rash, edema Neuro: alert & orientedx3, cranial nerves grossly intact. moves all 4 extremities w/o difficulty. Affect pleasant    Telemetry  Sinus 60-70s   Labs    CBC Recent Labs    11/20/19 0500 11/21/19 0521  WBC 8.8 10.2  HGB 10.3* 10.8*  HCT 32.2* 34.6*  MCV 95.8 94.0  PLT 399 818*   Basic Metabolic Panel Recent Labs    11/20/19 0500 11/21/19 0521  NA 136 135  K 4.7 4.6  CL 97* 97*  CO2 28 27  GLUCOSE 104* 108*  BUN 20 23  CREATININE 1.07 1.13  CALCIUM 9.1 9.1   Liver Function Tests No results for input(s): AST, ALT, ALKPHOS, BILITOT, PROT, ALBUMIN in the last 72 hours. No results for  input(s): LIPASE, AMYLASE in the last 72 hours. Cardiac Enzymes No results for input(s): CKTOTAL, CKMB, CKMBINDEX, TROPONINI in the last 72 hours.  BNP: BNP (last 3 results) Recent Labs    11/05/19 2228  BNP 468.7*    ProBNP (last 3 results) No results for input(s): PROBNP in the last 8760 hours.   D-Dimer No results for input(s): DDIMER in the last 72 hours. Hemoglobin A1C No results for input(s): HGBA1C in the last 72 hours. Fasting Lipid Panel No results for input(s): CHOL, HDL, LDLCALC, TRIG, CHOLHDL, LDLDIRECT in the last 72 hours. Thyroid Function Tests No results for input(s): TSH, T4TOTAL, T3FREE, THYROIDAB in the last 72 hours.  Invalid input(s): FREET3  Other results:   Imaging    No results found.   Medications:     Scheduled Medications: . amiodarone  200 mg Oral BID  . apixaban  5 mg Oral BID  . aspirin EC  81 mg Oral Daily  . bisacodyl  10 mg Oral Daily   Or  . bisacodyl  10 mg Rectal Daily  . clonazepam  0.25 mg Oral Daily  . colchicine  0.6 mg Oral BID  . digoxin  0.125 mg Oral Daily  . docusate sodium  200 mg Oral Daily  . feeding supplement (ENSURE ENLIVE)  237 mL Oral TID WC  . furosemide  40  mg Oral Daily  . insulin aspart  0-15 Units Subcutaneous TID WC  . mouth rinse  15 mL Mouth Rinse BID  . pantoprazole  40 mg Oral Daily  . potassium chloride  20 mEq Oral BID  . sacubitril-valsartan  1 tablet Oral BID  . sodium chloride flush  10-40 mL Intracatheter Q12H  . sodium chloride flush  3 mL Intravenous Q12H  . spironolactone  25 mg Oral Daily    Infusions: . sodium chloride      PRN Medications: ondansetron (ZOFRAN) IV, oxyCODONE, pneumococcal 23 valent vaccine, sodium chloride flush, sodium chloride flush, traMADol, traZODone    Assessment/Plan   1. CAD: H/o PCI to LCX in 2017. LHC 11/05/19 with multivessel CAD, including calcified 80% distal LM stenosis, small diffusely diseased distal LAD, chronically occluded OM1, and 50%  mid RCA lesion.  He is now s/p LIMA-LAD, SVG-OM1, SVG-OM2 and SVG-PDA. - Intolerance of statins, he is on Praluent at home. - Continue ASA.  2. Acute systolic CHF: Ischemic cardiomyopathy.  Echo pre-op with EF 25-30%.  Impella 5.5 removed 9/17.  Now off milrinone. Diuresed well on lasix gtt, now back down to pre-op wt.   -CO-OX stable.  - No bb for now. Add as an outpatient.  - Continue Entresto 24-26 mg bid. No room to titrate with soft BP.  - Continue Spiro 25 mg daily  - Volume status stable. Continue Lasix 40 mg daily  - consider future addition of Farxiga  - continue digoxin 0.125. Dig level ok (0.6) - If EF remains low after CABG, will be CRT-D candidate with LBBB.  3. PVCs: Frequent.  Now on amiodarone.  - no change 4. Paroxysmal atrial fibrillation/flutter: Went back into rapid afib overnight, back in NSR -Maintaing NSR. Continue amio 200 mg twice a day.  - now on apixaban.  5. Hypokalemia - resolved 6.  Rt Toe Pain - empirically treating for gout. - pain improving w/ colchicine    HF follow has been set up.  HF meds for d/c  Lasix 40 mg po daily Entresto 24-26 mg twice a day  Spironolactone 25 mg daily  Digoxin 0.125 mg daily  Amio 200 mg twice a day x 10 days then 200 mg daily after that Apixaban 5 mg twice a day  KCl 20 mEq daily  Length of Stay: [place], NP  11/21/2019, 8:15 AM  Advanced Heart Failure Team Pager [phone] (M-F; 7a - 4p)  Please contact [facility] for night-coverage after hours (4p -7a ) and weekends on [email or web address]  Patient seen with NP, agree with the above note.   No complaints this morning, wants to go home.  He remains in NSR.  SBP 90s-100s, no lightheadedness.  He is not volume overloaded on exam.  I think that he is ready to go home on the above regimen, followup in CHF clinic.   Loralie Champagne 11/21/2019 8:43 AM

## 2019-11-21 NOTE — Progress Notes (Addendum)
[facility] DAILY ICU PROGRESS NOTE                   [address]            [place] [address]          [phone]   6 Days Post-Op Procedure(s) (LRB): REMOVAL OF IMPELLA 5.5 LEFT VENTRICULAR ASSIST DEVICE (N/A) TRANSESOPHAGEAL ECHOCARDIOGRAM (TEE) (N/A)  Total Length of Stay:  LOS: 16 days   Subjective: Good day yesterday, no new concerns except he is not sleeping well.  He is anxious to return home.   Wt 3kg below pre-op.   Objective: Vital signs in last 24 hours: Temp:  [97.7 F (36.5 C)-98.8 F (37.1 C)] 98.8 F (37.1 C) (09/23 0806) Pulse Rate:  [58-73] 70 (09/23 0806) Cardiac Rhythm: Normal sinus rhythm;Bundle branch block (09/22 1930) Resp:  [14-18] 18 (09/23 0806) BP: (90-116)/(59-67) 98/62 (09/23 0806) SpO2:  [92 %-96 %] 94 % (09/23 0806) Weight:  [76.1 kg] 76.1 kg (09/23 0450)  Filed Weights   11/19/19 0500 11/20/19 0433 11/21/19 0450  Weight: 79.5 kg 78.6 kg 76.1 kg    Weight change: -2.495 kg      Intake/Output from previous day: 09/22 0701 - 09/23 0700 In: 129 [P.O.:129] Out: 1900 [Urine:1900]  Intake/Output this shift: No intake/output data recorded.  Current Meds: Scheduled Meds: . amiodarone  200 mg Oral BID  . apixaban  5 mg Oral BID  . aspirin EC  81 mg Oral Daily  . bisacodyl  10 mg Oral Daily   Or  . bisacodyl  10 mg Rectal Daily  . clonazepam  0.25 mg Oral Daily  . colchicine  0.6 mg Oral BID  . digoxin  0.125 mg Oral Daily  . docusate sodium  200 mg Oral Daily  . feeding supplement (ENSURE ENLIVE)  237 mL Oral TID WC  . furosemide  40 mg Oral Daily  . insulin aspart  0-15 Units Subcutaneous TID WC  . mouth rinse  15 mL Mouth Rinse BID  . pantoprazole  40 mg Oral Daily  . potassium chloride  20 mEq Oral BID  . sacubitril-valsartan  1 tablet Oral BID  . sodium chloride flush  10-40 mL Intracatheter Q12H  . sodium chloride flush  3 mL Intravenous Q12H  . spironolactone  25 mg Oral Daily   Continuous  Infusions: . sodium chloride     PRN Meds:.ondansetron (ZOFRAN) IV, oxyCODONE, pneumococcal 23 valent vaccine, sodium chloride flush, sodium chloride flush, traMADol, traZODone  General appearance: alert, cooperative and no distress Neurologic: intact Heart: SR, no further a-fib  Lungs: clear to auscultation bilaterally. Good sats on RA.  Abdomen: Soft and non-tender. Extremities: Warm and well perfused, no peripheral edema. Wound: the sternotomy, right neck,  and RLE EVH incisions are all intact and healing well.   Lab Results: CBC: Recent Labs    11/20/19 0500 11/21/19 0521  WBC 8.8 10.2  HGB 10.3* 10.8*  HCT 32.2* 34.6*  PLT 399 495*   BMET:  Recent Labs    11/20/19 0500 11/21/19 0521  NA 136 135  K 4.7 4.6  CL 97* 97*  CO2 28 27  GLUCOSE 104* 108*  BUN 20 23  CREATININE 1.07 1.13  CALCIUM 9.1 9.1    CMET: Lab Results  Component Value Date   WBC 10.2 11/21/2019   HGB 10.8 (L) 11/21/2019   HCT 34.6 (L) 11/21/2019   PLT 495 (H) 11/21/2019   GLUCOSE 108 (H)  11/21/2019   CHOL 154 10/24/2019   TRIG 120 10/24/2019   HDL 61 10/24/2019   LDLCALC 69 10/24/2019   ALT 16 11/18/2019   AST 14 (L) 11/18/2019   NA 135 11/21/2019   K 4.6 11/21/2019   CL 97 (L) 11/21/2019   CREATININE 1.13 11/21/2019   BUN 23 11/21/2019   CO2 27 11/21/2019   TSH 3.303 11/07/2019   PSA 0.83 06/15/2015   INR 1.4 (H) 11/11/2019   HGBA1C 5.6 11/07/2019      PT/INR: No results for input(s): LABPROT, INR in the last 72 hours. Radiology: No results found.   Assessment/Plan: S/P Procedure(s) (LRB): REMOVAL OF IMPELLA 5.5 LEFT VENTRICULAR ASSIST DEVICE (N/A) TRANSESOPHAGEAL ECHOCARDIOGRAM (TEE) (N/A)  -POD 10 CABG x 4 and placement of Impella 5.5 for low EF. POD-6 removal of Impella.  Independent with mobility.   -Post-op atrial fibrillation- SR past 48 hours on oral amiodarone  converting t oral  Eliquis added yesterday  -Volume excess- resolved, now below pre-op weight.    -DVT PPX- continue with enoxaparin.  -Disposition- discharge to home today. He has a walker and BSC at home.    Antony Odea  [phone] 11/21/2019 8:12 AM  sats 90% in recliner on RA R arm PICC removed- entire length DC instructions reviewed with patient Stable fpr DC home patient examined and medical record reviewed,agree with above note. [name] 11/21/2019

## 2019-11-21 NOTE — Progress Notes (Signed)
Pt provided discharge instructions and education. Pt iv removed and intact. Telebox removed/ccmd notified. Vitals stable. Pt denies complaints. [facility] pharmacy delivered all home meds. Wife at bedside has all belongings. Valet to tx pt to parking.  Jerald Kief, RN

## 2019-11-27 ENCOUNTER — Other Ambulatory Visit: Payer: Self-pay

## 2019-11-27 ENCOUNTER — Ambulatory Visit (INDEPENDENT_AMBULATORY_CARE_PROVIDER_SITE_OTHER): Payer: Self-pay | Admitting: Cardiothoracic Surgery

## 2019-11-27 ENCOUNTER — Encounter: Payer: Self-pay | Admitting: Cardiothoracic Surgery

## 2019-11-27 VITALS — BP 95/64 | HR 80 | Temp 96.8°F | Resp 20 | Ht 66.0 in | Wt 163.0 lb

## 2019-11-27 DIAGNOSIS — Z951 Presence of aortocoronary bypass graft: Secondary | ICD-10-CM

## 2019-11-27 DIAGNOSIS — I2510 Atherosclerotic heart disease of native coronary artery without angina pectoris: Secondary | ICD-10-CM

## 2019-11-27 DIAGNOSIS — Z09 Encounter for follow-up examination after completed treatment for conditions other than malignant neoplasm: Secondary | ICD-10-CM

## 2019-11-27 MED ORDER — TRAZODONE HCL 50 MG PO TABS: 50.0000 mg | ORAL_TABLET | Freq: Every evening | ORAL | 0 refills | Status: DC | PRN

## 2019-11-27 NOTE — Progress Notes (Signed)
PCP is Glean Hess, MD Referring Provider is End, Harrell Gave, MD  Chief Complaint  Patient presents with  . Routine Post Op    f/u s/p CABG 11/11/19    HPI: Patient returns for postop check and suture removal after CABG with Impella 5.5 VAD for ischemic cardiomyopathy.  He is doing well without symptoms of angina or CHF.  His exercise tolerance is improved.  No evidence of fluid retention.  No bleeding problems from the Eliquis started for postoperative atrial fibrillation. No complaints about his surgical incisions.   Past Medical History:  Diagnosis Date  . Abnormal nuclear cardiac imaging test 08/08/2015  . Arthritis    fingers  . Carotid artery occlusion   . Coronary atherosclerosis of native coronary artery 01/29/2013   11/05/19 R/LHC 80% dLMCA stenosis small diffusely dz dLAD, chronically occluded OM1 50%mRCA lesion, widely patent mLCx strent, moderately elevated L heart filling pressures, mild to moderate RH filling pressures, normal to moderately reduced CO  . Duodenal erosion   . Encounter for screening for lung cancer 07/13/2016  . Esophageal stenosis    esophageal dilation  . GERD (gastroesophageal reflux disease)   . H. pylori infection   . Heart attack [facility]) Oct. 2009   Mild  . Hiatal hernia   . Hyperlipidemia   . Hypertension   . 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 Cardiac catheterization-nonobstructive 60% RCA disease-subsequent nuclear stress test-9 minutes, low risk, mild inferior wall hypokinesis   . Pain in limb 12/19/2017  . Peripheral vascular disease ([place])   . Unstable angina ([place]) 11/25/2017    Past Surgical History:  Procedure Laterality Date  . APPENDECTOMY    . BACK SURGERY    . CARDIAC CATHETERIZATION N/A 08/07/2015   Procedure: Left Heart Cath and Coronary Angiography;  Surgeon: [name], MD;  Location: [place] [facility];  Service: Cardiovascular;  Laterality: N/A;  . CARDIAC  CATHETERIZATION N/A 08/07/2015   Procedure: Coronary Stent Intervention;  Surgeon: [name], MD;  Location: [place] [facility];  Service: Cardiovascular;  Laterality: N/A;  . CARDIAC CATHETERIZATION N/A 08/07/2015   Procedure: Coronary Stent Intervention;  Surgeon: Vikkie Goeden M Martinique, MD;  Location: [place] [facility];  Service: Cardiovascular;  Laterality: N/A;  . CAROTID ENDARTERECTOMY  01/05/2006   Right  CEA with DPA  . CATARACT EXTRACTION W/ INTRAOCULAR LENS IMPLANT Left 12/04/2017  . CATARACT EXTRACTION W/PHACO Left 12/04/2017   Procedure: CATARACT EXTRACTION PHACO AND INTRAOCULAR LENS PLACEMENT ([place]) LEFT;  Surgeon: Eulogio Bear, MD;  Location: [place];  Service: Ophthalmology;  Laterality: Left;  . CATARACT EXTRACTION W/PHACO Right 02/06/2018   Procedure: CATARACT EXTRACTION PHACO AND INTRAOCULAR LENS PLACEMENT (I[facility])RIGHT;  Surgeon: Eulogio Bear, MD;  Location: [place];  Service: Ophthalmology;  Laterality: Right;  . COLONOSCOPY  05/20/2008  . COLONOSCOPY WITH PROPOFOL N/A 09/13/2018   Procedure: COLONOSCOPY WITH BIOPSY;  Surgeon: [name], MD;  Location: [place];  Service: Endoscopy;  Laterality: N/A;  . CORONARY ARTERY BYPASS GRAFT N/A 11/11/2019   Procedure: CORONARY ARTERY BYPASS GRAFTING (CABG) USING LIMA to Diag1; ENDOSCOPICALLY HARVESTED RIGHT GREATER SAPHENOUS VEIN: SVG to OM1; SVG to OM2; SVG to PDA.;  Surgeon: Ivin Poot, MD;  Location: [place];  Service: Open Heart Surgery;  Laterality: N/A;  . CORONARY STENT PLACEMENT  08/07/2015   MID CIRCUMFLEX  . ENDOVEIN HARVEST OF GREATER SAPHENOUS VEIN Right 11/11/2019   Procedure: ENDOVEIN HARVEST  OF GREATER SAPHENOUS VEIN;  Surgeon: Ivin Poot, MD;  Location: [place];  Service: Open Heart Surgery;  Laterality: Right;  . ESOPHAGOGASTRODUODENOSCOPY (EGD) WITH PROPOFOL N/A 02/11/2019   Procedure: ESOPHAGOGASTRODUODENOSCOPY (EGD) WITH BIOPSY and  Dilation;  Surgeon: [name], MD;   Location: [place];  Service: Endoscopy;  Laterality: N/A;  . HIP SURGERY Left 10/2016   left hip tendon repair  . LEFT HEART CATH AND CORONARY ANGIOGRAPHY N/A 11/27/2017   Procedure: LEFT HEART CATH AND CORONARY ANGIOGRAPHY;  Surgeon: Wellington Hampshire, MD;  Location: [place] [facility];  Service: Cardiovascular;  Laterality: N/A;  . LOWER EXTREMITY ANGIOGRAPHY Left 02/12/2018   Procedure: LOWER EXTREMITY ANGIOGRAPHY;  Surgeon: Algernon Huxley, MD;  Location: [place] [facility];  Service: Cardiovascular;  Laterality: Left;  . LOWER EXTREMITY ANGIOGRAPHY Left 03/07/2018   Procedure: LOWER EXTREMITY ANGIOGRAPHY;  Surgeon: Algernon Huxley, MD;  Location: [place] [facility];  Service: Cardiovascular;  Laterality: Left;  . LOWER EXTREMITY ANGIOGRAPHY Left 06/04/2018   Procedure: LOWER EXTREMITY ANGIOGRAPHY;  Surgeon: Algernon Huxley, MD;  Location: [place] [facility];  Service: Cardiovascular;  Laterality: Left;  . PLACEMENT OF IMPELLA LEFT VENTRICULAR ASSIST DEVICE N/A 11/11/2019   Procedure: PLACEMENT OF IMPELLA LEFT VENTRICULAR ASSIST DEVICE 5.5;  Surgeon: Ivin Poot, MD;  Location: [place];  Service: Open Heart Surgery;  Laterality: N/A;  Midline Sternotomy  . POLYPECTOMY N/A 09/13/2018   Procedure: POLYPECTOMY;  Surgeon: [name], MD;  Location: [place];  Service: Endoscopy;  Laterality: N/A;  . POLYPECTOMY N/A 02/11/2019   Procedure: POLYPECTOMY;  Surgeon: [name], MD;  Location: [place];  Service: Endoscopy;  Laterality: N/A;  . REMOVAL OF IMPELLA LEFT VENTRICULAR ASSIST DEVICE N/A 11/15/2019   Procedure: REMOVAL OF IMPELLA 5.5 LEFT VENTRICULAR ASSIST DEVICE;  Surgeon: Ivin Poot, MD;  Location: [place];  Service: Open Heart Surgery;  Laterality: N/A;  . RIGHT/LEFT HEART CATH AND CORONARY ANGIOGRAPHY N/A 11/05/2019   Procedure: RIGHT/LEFT HEART CATH AND CORONARY ANGIOGRAPHY;  Surgeon: Nelva Bush, MD;  Location: [place] [facility];  Service:  Cardiovascular;  Laterality: N/A;  . SPINE SURGERY    . TEE WITHOUT CARDIOVERSION N/A 11/11/2019   Procedure: TRANSESOPHAGEAL ECHOCARDIOGRAM (TEE);  Surgeon: Prescott Gum, Collier Salina, MD;  Location: [place];  Service: Open Heart Surgery;  Laterality: N/A;  . TEE WITHOUT CARDIOVERSION N/A 11/15/2019   Procedure: TRANSESOPHAGEAL ECHOCARDIOGRAM (TEE);  Surgeon: Prescott Gum, Collier Salina, MD;  Location: [place];  Service: Open Heart Surgery;  Laterality: N/A;  . TONSILLECTOMY      Family History  Problem Relation Age of Onset  . Heart attack Mother   . Coronary artery disease Mother   . Heart disease Mother        Carotid Stenosis and BPG and Heart Disease before age 17  . Diabetes Mother   . Hypertension Mother   . Heart attack Father   . Heart disease Father        BPG and Heart Disease before age 25  . Hypertension Father   . Cancer Father 55       throat  . Stroke Father   . Colon cancer Neg Hx   . Colon polyps Neg Hx   . Esophageal cancer Neg Hx   . Rectal cancer Neg Hx   . Stomach cancer Neg Hx     Social History Social History   Tobacco Use  . Smoking status: Former Smoker  Packs/day: 1.25    Years: 35.00    Pack years: 43.75    Types: Cigarettes    Quit date: 02/28/2005    Years since quitting: 14.7  . Smokeless tobacco: Current User    Types: Snuff  . Tobacco comment: occaisionally  Vaping Use  . Vaping Use: Never used  Substance Use Topics  . Alcohol use: Yes    Alcohol/week: 8.0 - 10.0 standard drinks    Types: 8 - 10 Glasses of wine per week  . Drug use: No    Current Outpatient Medications  Medication Sig Dispense Refill  . Alirocumab (PRALUENT) 150 MG/ML SOAJ Inject 1 pen into the skin every 14 (fourteen) days. 2 mL 11  . amiodarone (PACERONE) 200 MG tablet Take 1 tablet (200 mg total) by mouth 2 (two) times daily. For 10 days then decrease to 1 tablet (200mg ) by mouth once daily 60 tablet 1  . apixaban (ELIQUIS) 5 MG TABS tablet Take 1 tablet (5 mg total) by mouth 2  (two) times daily. 60 tablet 2  . aspirin EC 81 MG tablet Take 1 tablet (81 mg total) by mouth daily. 90 tablet 3  . clonazePAM (KLONOPIN) 0.25 MG disintegrating tablet Take 1 tablet (0.25 mg total) by mouth daily. 30 tablet 2  . colchicine 0.6 MG tablet Take 1 tablet (0.6 mg total) by mouth 2 (two) times daily. 30 tablet 1  . digoxin (LANOXIN) 0.125 MG tablet Take 1 tablet (0.125 mg total) by mouth daily. 30 tablet 1  . furosemide (LASIX) 40 MG tablet Take 1 tablet (40 mg total) by mouth daily. 30 tablet 1  . nitroGLYCERIN (NITROSTAT) 0.4 MG SL tablet Place 1 tablet (0.4 mg total) under the tongue every 5 (five) minutes as needed for chest pain. 25 tablet 2  . pantoprazole (PROTONIX) 40 MG tablet Take 1 tablet (40 mg total) by mouth daily. 90 tablet 1  . potassium chloride SA (KLOR-CON) 20 MEQ tablet Take 1 tablet (20 mEq total) by mouth 2 (two) times daily. 30 tablet 2  . sacubitril-valsartan (ENTRESTO) 24-26 MG Take 1 tablet by mouth 2 (two) times daily. 60 tablet 2  . spironolactone (ALDACTONE) 25 MG tablet Take 1 tablet (25 mg total) by mouth daily. 30 tablet 2  . triamcinolone cream (KENALOG) 0.1 % Apply 1 application topically 2 (two) times daily as needed for itching.     No current facility-administered medications for this visit.    Allergies  Allergen Reactions  . Brilinta [Ticagrelor] Shortness Of Breath  . Chlorhexidine Gluconate Other (See Comments)    Skin burning for hours afterward  . Statins Other (See Comments)    Failed Crestor 5 mg twice weekly, Crestor 20 mg daily, Pravastatin 40 mg qd, Lipitor, Zocor - muscle aches  . Zetia [Ezetimibe] Other (See Comments)    Muscle aches    Review of Systems  Weight stable Poor sleeping Abnormal tasting of food No orthopnea No dizziness or presyncope  BP 95/64   Pulse 80   Temp (!) 96.8 F (36 C)   Resp 20   Ht 5\' 6"  (1.676 m)   Wt 163 lb (73.9 kg)   SpO2 94%   BMI 26.31 kg/m  Physical Exam      Exam    General-  alert and comfortable.  Sternal and leg incisions well-healed.  Right neck incision sutures removed at Impella catheter site.    Neck- no JVD, no cervical adenopathy palpable, no carotid bruit   Lungs- clear  without rales, wheezes   Cor- regular rate and rhythm, no murmur , gallop   Abdomen- soft, non-tender   Extremities - warm, non-tender, minimal edema   Neuro- oriented, appropriate, no focal weakness   Diagnostic Tests:  None Impression: Doing well 10 days after discharge from hospital for CABG for ischemic cardiomyopathy EF 25%-30%  Plan: Continue current medications and sternal precautions. We will call in some trazodone to help him sleep.  Return for scheduled follow-up appointment in 3 weeks. Len Childs, MD [facility] [phone]

## 2019-12-02 NOTE — Progress Notes (Signed)
Office Visit    Patient Name: [name] Date of Encounter: 12/04/2019  Primary Care Provider:  Glean Hess, MD Primary Cardiologist:  Nelva Bush, MD Electrophysiologist:  None   Chief Complaint    [name] is a 69 y.o. male with a hx of CAD s/p remote PCI to LCx in setting of NSTEMI and s/p CABGx4 10/2019, ICM/combined systolic and diastolic heart failure, PAD (right carotid endarterectomy, L SFA stent), HTN, HLD, prior tobacco use presents today for follow up after CABG.   Past Medical History    Past Medical History:  Diagnosis Date  . Abnormal nuclear cardiac imaging test 08/08/2015  . Arthritis    fingers  . Carotid artery occlusion   . Coronary atherosclerosis of native coronary artery 01/29/2013   11/05/19 R/LHC 80% dLMCA stenosis small diffusely dz dLAD, chronically occluded OM1 50%mRCA lesion, widely patent mLCx strent, moderately elevated L heart filling pressures, mild to moderate RH filling pressures, normal to moderately reduced CO  . Duodenal erosion   . Encounter for screening for lung cancer 07/13/2016  . Esophageal stenosis    esophageal dilation  . GERD (gastroesophageal reflux disease)   . H. pylori infection   . Heart attack [facility]) Oct. 2009   Mild  . Hiatal hernia   . Hyperlipidemia   . Hypertension   . 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 Cardiac catheterization-nonobstructive 60% RCA disease-subsequent nuclear stress test-9 minutes, low risk, mild inferior wall hypokinesis   . Pain in limb 12/19/2017  . Peripheral vascular disease ([place])   . Unstable angina ([place]) 11/25/2017   Past Surgical History:  Procedure Laterality Date  . APPENDECTOMY    . BACK SURGERY    . CARDIAC CATHETERIZATION N/A 08/07/2015   Procedure: Left Heart Cath and Coronary Angiography;  Surgeon: [name], MD;  Location: [place] CV [facility];  Service: Cardiovascular;  Laterality: N/A;  . CARDIAC  CATHETERIZATION N/A 08/07/2015   Procedure: Coronary Stent Intervention;  Surgeon: [name], MD;  Location: [place] [facility];  Service: Cardiovascular;  Laterality: N/A;  . CARDIAC CATHETERIZATION N/A 08/07/2015   Procedure: Coronary Stent Intervention;  Surgeon: [name], MD;  Location: [place] [facility];  Service: Cardiovascular;  Laterality: N/A;  . CAROTID ENDARTERECTOMY  01/05/2006   Right  CEA with DPA  . CATARACT EXTRACTION W/ INTRAOCULAR LENS IMPLANT Left 12/04/2017  . CATARACT EXTRACTION W/PHACO Left 12/04/2017   Procedure: CATARACT EXTRACTION PHACO AND INTRAOCULAR LENS PLACEMENT ([place]) LEFT;  Surgeon: Eulogio Bear, MD;  Location: [place];  Service: Ophthalmology;  Laterality: Left;  . CATARACT EXTRACTION W/PHACO Right 02/06/2018   Procedure: CATARACT EXTRACTION PHACO AND INTRAOCULAR LENS PLACEMENT (I[place])RIGHT;  Surgeon: Eulogio Bear, MD;  Location: [place];  Service: Ophthalmology;  Laterality: Right;  . COLONOSCOPY  05/20/2008  . COLONOSCOPY WITH PROPOFOL N/A 09/13/2018   Procedure: COLONOSCOPY WITH BIOPSY;  Surgeon: [name], MD;  Location: [place];  Service: Endoscopy;  Laterality: N/A;  . CORONARY ARTERY BYPASS GRAFT N/A 11/11/2019   Procedure: CORONARY ARTERY BYPASS GRAFTING (CABG) USING LIMA to Diag1; ENDOSCOPICALLY HARVESTED RIGHT GREATER SAPHENOUS VEIN: SVG to OM1; SVG to OM2; SVG to PDA.;  Surgeon: Ivin Poot, MD;  Location: [place];  Service: Open Heart Surgery;  Laterality: N/A;  . CORONARY STENT PLACEMENT  08/07/2015   MID CIRCUMFLEX  . ENDOVEIN HARVEST OF GREATER SAPHENOUS VEIN Right 11/11/2019  Procedure: ENDOVEIN HARVEST OF GREATER SAPHENOUS VEIN;  Surgeon: Ivin Poot, MD;  Location: [place];  Service: Open Heart Surgery;  Laterality: Right;  . ESOPHAGOGASTRODUODENOSCOPY (EGD) WITH PROPOFOL N/A 02/11/2019   Procedure: ESOPHAGOGASTRODUODENOSCOPY (EGD) WITH BIOPSY and  Dilation;  Surgeon: [name], MD;   Location: [place];  Service: Endoscopy;  Laterality: N/A;  . HIP SURGERY Left 10/2016   left hip tendon repair  . LEFT HEART CATH AND CORONARY ANGIOGRAPHY N/A 11/27/2017   Procedure: LEFT HEART CATH AND CORONARY ANGIOGRAPHY;  Surgeon: Wellington Hampshire, MD;  Location: [place] [facility];  Service: Cardiovascular;  Laterality: N/A;  . LOWER EXTREMITY ANGIOGRAPHY Left 02/12/2018   Procedure: LOWER EXTREMITY ANGIOGRAPHY;  Surgeon: Algernon Huxley, MD;  Location: [place] [facility];  Service: Cardiovascular;  Laterality: Left;  . LOWER EXTREMITY ANGIOGRAPHY Left 03/07/2018   Procedure: LOWER EXTREMITY ANGIOGRAPHY;  Surgeon: Algernon Huxley, MD;  Location: [place] [facility];  Service: Cardiovascular;  Laterality: Left;  . LOWER EXTREMITY ANGIOGRAPHY Left 06/04/2018   Procedure: LOWER EXTREMITY ANGIOGRAPHY;  Surgeon: Algernon Huxley, MD;  Location: [place] [facility];  Service: Cardiovascular;  Laterality: Left;  . PLACEMENT OF IMPELLA LEFT VENTRICULAR ASSIST DEVICE N/A 11/11/2019   Procedure: PLACEMENT OF IMPELLA LEFT VENTRICULAR ASSIST DEVICE 5.5;  Surgeon: Ivin Poot, MD;  Location: [place];  Service: Open Heart Surgery;  Laterality: N/A;  Midline Sternotomy  . POLYPECTOMY N/A 09/13/2018   Procedure: POLYPECTOMY;  Surgeon: [name], MD;  Location: [place];  Service: Endoscopy;  Laterality: N/A;  . POLYPECTOMY N/A 02/11/2019   Procedure: POLYPECTOMY;  Surgeon: [name], MD;  Location: [place];  Service: Endoscopy;  Laterality: N/A;  . REMOVAL OF IMPELLA LEFT VENTRICULAR ASSIST DEVICE N/A 11/15/2019   Procedure: REMOVAL OF IMPELLA 5.5 LEFT VENTRICULAR ASSIST DEVICE;  Surgeon: Ivin Poot, MD;  Location: [place];  Service: Open Heart Surgery;  Laterality: N/A;  . RIGHT/LEFT HEART CATH AND CORONARY ANGIOGRAPHY N/A 11/05/2019   Procedure: RIGHT/LEFT HEART CATH AND CORONARY ANGIOGRAPHY;  Surgeon: Nelva Bush, MD;  Location: [place] [facility];  Service:  Cardiovascular;  Laterality: N/A;  . SPINE SURGERY    . TEE WITHOUT CARDIOVERSION N/A 11/11/2019   Procedure: TRANSESOPHAGEAL ECHOCARDIOGRAM (TEE);  Surgeon: Prescott Gum, Collier Salina, MD;  Location: [place];  Service: Open Heart Surgery;  Laterality: N/A;  . TEE WITHOUT CARDIOVERSION N/A 11/15/2019   Procedure: TRANSESOPHAGEAL ECHOCARDIOGRAM (TEE);  Surgeon: Prescott Gum, Collier Salina, MD;  Location: [place];  Service: Open Heart Surgery;  Laterality: N/A;  . TONSILLECTOMY      Allergies  Allergies  Allergen Reactions  . Brilinta [Ticagrelor] Shortness Of Breath  . Chlorhexidine Gluconate Other (See Comments)    Skin burning for hours afterward  . Statins Other (See Comments)    Failed Crestor 5 mg twice weekly, Crestor 20 mg daily, Pravastatin 40 mg qd, Lipitor, Zocor - muscle aches  . Zetia [Ezetimibe] Other (See Comments)    Muscle aches    History of Present Illness    ALQUAN MORRISH is a 69 y.o. male with a hx of CAD s/p remote PCI to LCx in setting of NSTEMI and s/p CABGx4 10/2019, ICM/combined systolic and diastolic heart failure, PAD (right carotid endarterectomy, L SFA stent), HTN, HLD, prior tobacco use. He was last seen while hospitalized.  Previous cardiac work-up includes LHC 10/2017 with widely patent LCx stent and otherwise mild to moderate CAD.  Mild to moderately reduced LV  SF with EF 40-45% and mildly elevated LVEDP 30 mmHg. Echo with EF 45-50%. Recommended for medication management.  Repeat limited echo 01/2018 EF 40-45%.  Underwent mechanical thrombectomy and catheter thrombolysis to L SFA 05/2018.   Seen in clinic 10/21/19 noting DOE. He had new TWI leads V1-V6. Seen in ED same day with TWI resolved with exception of lead V5-6. He did not stay to be evaluated. He was scheduled for [facility] 11/05/19 which showed multivessel CAD including calcified 80% distal LMCA stenosis, small diffusely diseased distal LAD, CTO OM1, 50% mid RCA lesion. Mid LCx stent patent. He was transferred to [facility] for CABG.  Echo 11/06/19 EF 25-30%, LV severely dilated, mild MR.  After transfer he was medically stabilized with assistance of heart failure team due to elevated wedge pressure and need for Plavix washout.  Underwent CABGx4 with placement of Impella 11/11/19.  He was followed closely by the advanced heart failure team.  He required platelets, FFP, PRBC.  He did develop atrial fib with RVR on POD2, amiodarone started, converted to NSR. Impella LVAD removed POD4. He was discharged on Amiodarone and Eliquis.   Seen by surgeon 11/27/19 for postop noted to be doing well.  He was given prescription for trazodone for assistance with sleeping.  Presents today for follow-up.  Reports feeling overall well since hospital discharge.  He is very grateful for cardiology and cardiothoracic teams care. Has been walking 2-3 times per day about 30 minutes each time without difficulty.  He has some soreness to the right neck and chest where the Impella device is implanted.  This is overall improving.  Reports no chest pain, pressure, tightness.  Reports no shortness of breath or dyspnea on exertion.  Denies lower extremity edema, orthopnea, PND  Does describe some nausea in the mornings.  Tells me it occasionally occurs during the daytime hours.  Notices it when first waking.  No lightheadedness, dizziness, near-syncope, syncope.  We discussed checking digoxin level as well as possibly spacing out some of his medications.  He does note some mild delayed ecchymosis to the right inner thigh associated with soreness.  His previous catheterization was provided via the radial artery access.  We discussed the the swelling may be related to the SVG site which is just distal.  Encouraged to elevate lower extremities when sitting.  Tells me the potassium is very hard to swallow.  We reviewed all of his medications in detail as well as the indications in the setting of heart failure and coronary disease.  Tells me the Lasix originally had a  going to the bathroom a lot but this is decreased.  Monitoring weight at home tells me it is stable.  EKGs/Labs/Other Studies Reviewed:   The following studies were reviewed today:  Echo 11/06/19  1. Poor quality images no parasternal/subcostal images.   2. Septal apical and inferior basal akinesis hypokinesis of mid and  apical inferior wall. Left ventricular ejection fraction, by estimation,  is 25 to 30%. The left ventricle has severely decreased function. The left  ventricle demonstrates regional wall  motion abnormalities (see scoring diagram/findings for description). The  left ventricular internal cavity size was severely dilated. Left  ventricular diastolic parameters were normal.   3. Right ventricular systolic function is normal. The right ventricular  size is normal.   4. The mitral valve is normal in structure. Mild mitral valve  regurgitation. No evidence of mitral stenosis.   5. The aortic valve was not well visualized. Aortic valve regurgitation  is not visualized. Mild aortic valve sclerosis is present, with no  evidence of aortic valve stenosis.   6. The inferior vena cava is normal in size with greater than 50%  respiratory variability, suggesting right atrial pressure of 3 mmHg.   R/LHC 11/05/19 Conclusions: 1. Multivessel CAD, including calcified 80% distal LMCA stenosis, small diffusely diseased distal LAD, chronically occluded OM1, and 50% mid RCA lesion. 2. Widely patent mid LCx stent. 3. Moderately elevated left heart filling pressure. 4. Mildly to moderately elevated right heart filling pressure. 5. Normal to mildly reduced cardiac output/index.   Recommendations: 1. Transfer to [facility] for cardiac surgery consultation for CABG, given severe LMCA disease and low LVEF. 2. Hold clopidogrel pending cardiac surgery consultation. 3. Obtain echocardiogram. 4. Aggressive secondary prevention.   EKG:  EKG is ordered today.  The ekg ordered today demonstrates  normal sinus rhythm 83 bpm with known left frontal branch block.  Recent Labs: 11/05/2019: B Natriuretic Peptide 468.7 11/07/2019: TSH 3.303 11/16/2019: Magnesium 2.0 11/18/2019: ALT 16 11/21/2019: BUN 23; Creatinine, Ser 1.13; Hemoglobin 10.8; Platelets 495; Potassium 4.6; Sodium 135  Recent Lipid Panel    Component Value Date/Time   CHOL 154 10/24/2019 0716   CHOL 144 12/10/2018 0925   TRIG 120 10/24/2019 0716   HDL 61 10/24/2019 0716   HDL 59 12/10/2018 0925   CHOLHDL 2.5 10/24/2019 0716   VLDL 24 10/24/2019 0716   LDLCALC 69 10/24/2019 0716   LDLCALC 68 12/10/2018 0925    Home Medications   Current Meds  Medication Sig  . Alirocumab (PRALUENT) 150 MG/ML SOAJ Inject 1 pen into the skin every 14 (fourteen) days.  Marland Kitchen amiodarone (PACERONE) 200 MG tablet Take 200 mg by mouth daily.  Marland Kitchen apixaban (ELIQUIS) 5 MG TABS tablet Take 1 tablet (5 mg total) by mouth 2 (two) times daily.  Marland Kitchen aspirin EC 81 MG tablet Take 1 tablet (81 mg total) by mouth daily.  . clonazePAM (KLONOPIN) 0.25 MG disintegrating tablet Take 1 tablet (0.25 mg total) by mouth daily.  . colchicine 0.6 MG tablet Take 1 tablet (0.6 mg total) by mouth 2 (two) times daily.  . digoxin (LANOXIN) 0.125 MG tablet Take 1 tablet (0.125 mg total) by mouth daily.  . furosemide (LASIX) 40 MG tablet Take 1 tablet (40 mg total) by mouth daily.  . nitroGLYCERIN (NITROSTAT) 0.4 MG SL tablet Place 1 tablet (0.4 mg total) under the tongue every 5 (five) minutes as needed for chest pain.  . pantoprazole (PROTONIX) 40 MG tablet Take 1 tablet (40 mg total) by mouth daily.  . potassium chloride SA (KLOR-CON) 20 MEQ tablet Take 20 mEq by mouth daily.  . sacubitril-valsartan (ENTRESTO) 24-26 MG Take 1 tablet by mouth 2 (two) times daily.  Marland Kitchen spironolactone (ALDACTONE) 25 MG tablet Take 1 tablet (25 mg total) by mouth daily.  . traZODone (DESYREL) 50 MG tablet Take 1 tablet (50 mg total) by mouth at bedtime as needed for sleep.  Marland Kitchen triamcinolone cream  (KENALOG) 0.1 % Apply 1 application topically 2 (two) times daily as needed for itching.    Review of Systems    Review of Systems  Constitutional: Negative for chills, fever and malaise/fatigue.  Cardiovascular: Negative for chest pain, dyspnea on exertion, irregular heartbeat, leg swelling, near-syncope, orthopnea, palpitations and syncope.  Respiratory: Negative for cough, shortness of breath and wheezing.   Gastrointestinal: Positive for nausea. Negative for diarrhea, melena and vomiting.  Genitourinary: Negative for hematuria.  Neurological: Negative for dizziness, light-headedness and  weakness.   All other systems reviewed and are otherwise negative except as noted above.  Physical Exam    VS:  BP (!) 100/58 (BP Location: Left Arm, Patient Position: Sitting, Cuff Size: Normal)   Pulse 83   Ht 5\' 6"  (1.676 m)   Wt 168 lb 4 oz (76.3 kg)   SpO2 98%   BMI 27.16 kg/m  , BMI Body mass index is 27.16 kg/m. GEN: Well nourished, well developed, in no acute distress. HEENT: normal. Neck: Supple, no JVD, carotid bruits, or masses. Cardiac: RRR, no murmurs, rubs, or gallops. No clubbing, cyanosis, edema.  Radials/DP/PT 2+ and equal bilaterally.  Respiratory:  Respirations regular and unlabored, clear to auscultation bilaterally. GI: Soft, nontender, nondistended, BS + x 4. MS: No deformity or atrophy. Skin: Warm and dry, no rash.  Midsternal incision, chest tube incisions, RLE SVG site healing appropriately without signs of infection.  Mild yellowed ecchymosis to right without evidence of hematoma or infection. Neuro:  Strength and sensation are intact. Psych: Normal affect.  Assessment & Plan    1. CAD s/p CABG x4 11/11/19 -EKG today maintaining sinus rhythm with known left bundle branch block.  GDMT includes aspirin, Praluent.  History of statin intolerance. Potential future addition of beta blocker, as below.  Incisions healing appropriately.  Encouraged to participate in cardiac  rehab, referral previously placed.  2. HLD, LDL goal <70/Statin intolerance/Drug induced myopathy- Follows with pharmacy team.  Continue Praluent.  History of statin intolerance to Crestor, Pravastatin, Simvastatin, Atorvastatin.   3. High-risk medication use - On digoxin secondary to HFrEF/post op atrial fib.  Endorses some nausea but no lightheadedness/dizziness.  Digoxin level today.  4. Postoperative atrial fibrillation/anticoagulation - Maintaining NSR by EKG today.  Denies palpitations.  Denies bleeding complications on Eliquis 5 mg twice daily.  Does not meet criteria for reduced dose. CBC today.  5. On amiodarone therapy -in setting of postoperative atrial fibrillation.  We discussed continuing amiodarone for at least 3 months from surgery date.  No signs of toxicity. Plan for TSH and liver panel at next visit.  Continue amiodarone 200 mg daily.  6. HFrEF/ICM - Euvolemic and well compensated on exam.  Preop echo 11/06/2019 with EF 25-30%.  Present GDMT includes Entresto 24-26 mg BID, Spironolactone 25mg  QD, Lasix 40mg  QD, Potassium 54mEq QD, Digoxin 0.125mg  QD.  Due to relative hypotension will defer addition of beta-blocker at this time though will consider based on lab work. Digoxin level, BMP today.  He does note some difficulty taking potassium and hopeful we will be able to discontinue as he is on spironolactone.  He is down 10 pounds from his preop weight after diuresis. Hopeful to be able to reduce dose of Lasix to allow for room in BP to add beta blocker. Educated to report weight gain of 2 pounds overnight or 5 pounds in 1 week.  Educated on low-sodium diet and 2 L fluid restriction.  7. Nausea -check digoxin level, as above.  If digoxin level normal we will plan to space out his medications to see if this assists with nausea.  Encouraged bland diet as well as small regular meals.  Disposition: Follow up in 6 week(s) with Dr. Saunders Revel or APP.    Loel Dubonnet, NP 12/04/2019, 1:03 PM

## 2019-12-03 ENCOUNTER — Telehealth: Payer: Self-pay | Admitting: *Deleted

## 2019-12-03 NOTE — Telephone Encounter (Signed)
Attempted to contact and schedule lung screening scan. Message left for patient to call back to schedule. 

## 2019-12-03 NOTE — Telephone Encounter (Signed)
Patient returned call and reports recent CABG surgery and is not feeling up to lung scan at this time. Will follow up with Surgeon later this month and consider lung screening next month.

## 2019-12-04 ENCOUNTER — Encounter: Payer: Self-pay | Admitting: Family

## 2019-12-04 ENCOUNTER — Other Ambulatory Visit: Payer: Self-pay

## 2019-12-04 ENCOUNTER — Ambulatory Visit: Payer: PPO | Admitting: Family

## 2019-12-04 VITALS — BP 100/58 | HR 83 | Ht 66.0 in | Wt 168.2 lb

## 2019-12-04 DIAGNOSIS — Z7901 Long term (current) use of anticoagulants: Secondary | ICD-10-CM

## 2019-12-04 DIAGNOSIS — G720 Drug-induced myopathy: Secondary | ICD-10-CM | POA: Diagnosis not present

## 2019-12-04 DIAGNOSIS — I9789 Other postprocedural complications and disorders of the circulatory system, not elsewhere classified: Secondary | ICD-10-CM

## 2019-12-04 DIAGNOSIS — I4891 Unspecified atrial fibrillation: Secondary | ICD-10-CM | POA: Diagnosis not present

## 2019-12-04 DIAGNOSIS — Z79899 Other long term (current) drug therapy: Secondary | ICD-10-CM

## 2019-12-04 DIAGNOSIS — I5042 Chronic combined systolic (congestive) and diastolic (congestive) heart failure: Secondary | ICD-10-CM

## 2019-12-04 DIAGNOSIS — I25118 Atherosclerotic heart disease of native coronary artery with other forms of angina pectoris: Secondary | ICD-10-CM

## 2019-12-04 DIAGNOSIS — E785 Hyperlipidemia, unspecified: Secondary | ICD-10-CM

## 2019-12-04 DIAGNOSIS — Z789 Other specified health status: Secondary | ICD-10-CM | POA: Diagnosis not present

## 2019-12-04 DIAGNOSIS — I255 Ischemic cardiomyopathy: Secondary | ICD-10-CM

## 2019-12-04 NOTE — Patient Instructions (Addendum)
Medication Instructions:   Continue your current medications. We will adjust your Lasix and Potassium based on your lab work. You can hold your dose tomorrow morning until we call you with lab results tomorrow.  Recommend taking your Colchicine for 5-7 days to assess if it improves your pain in your right great toe. If it does not improve, recommend discussing with Dr. Army Melia.   *If you need a refill on your cardiac medications before your next appointment, please call your pharmacy*  Lab Work: Your physician recommends that you return for lab work today: digoxin level, BMP, CBC, BNP  If you have labs (blood work) drawn today and your tests are completely normal, you will receive your results only by: Marland Kitchen MyChart Message (if you have MyChart) OR . A paper copy in the mail If you have any lab test that is abnormal or we need to change your treatment, we will call you to review the results.  Testing/Procedures: Your EKG today shows you are maintaining normal sinus rhythm.   Follow-Up: At [facility], you and your health needs are our priority.  As part of our continuing mission to provide you with exceptional heart care, we have created designated Provider Care Teams.  These Care Teams include your primary Cardiologist (physician) and Advanced Practice Providers (APPs -  Physician Assistants and Nurse Practitioners) who all work together to provide you with the care you need, when you need it.  We recommend signing up for the patient portal called "MyChart".  Sign up information is provided on this After Visit Summary.  MyChart is used to connect with patients for Virtual Visits (Telemedicine).  Patients are able to view lab/test results, encounter notes, upcoming appointments, etc.  Non-urgent messages can be sent to your provider as well.   To learn more about what you can do with MyChart, go to [email or web address].    Your next appointment:   6 week(s)  The format for your next  appointment:   In Person  Provider:   You may see Nelva Bush, MD or one of the following Advanced Practice Providers on your designated Care Team:    Murray Hodgkins, NP  Christell Faith, PA-C  Marrianne Mood, PA-C  Cadence [place], [place]  Laurann Montana, NP   Other Instructions   You have been referred to cardiac rehab. This is a combination program including monitored exercise, dietary education, and support group. We strongly recommend participating in the program. Expect a phone call from them in approximately 2-3 weeks. If you do not hear from them, the phone number for cardiac rehab at [facility] is [phone].   Call our office if you gain 2 pounds overnight or 5 pounds in one week.  Recommend low salt diet. Additional information below.  Recommend less than 2L of fluid per day to prevent hanging onto excess fluid.   Heart-Healthy Eating Plan Heart-healthy meal planning includes:  Eating less unhealthy fats.  Eating more healthy fats.  Making other changes in your diet. Talk with your doctor or a diet specialist (dietitian) to create an eating plan that is right for you.  What are tips for following this plan? Cooking Avoid frying your food. Try to bake, boil, grill, or broil it instead. You can also reduce fat by:  Removing the skin from poultry.  Removing all visible fats from meats.  Steaming vegetables in water or broth. Meal planning   At meals, divide your plate into four equal parts: ? Fill one-half of your plate with  vegetables and green salads. ? Fill one-fourth of your plate with whole grains. ? Fill one-fourth of your plate with lean protein foods.  Eat 4-5 servings of vegetables per day. A serving of vegetables is: ? 1 cup of raw or cooked vegetables. ? 2 cups of raw leafy greens.  Eat 4-5 servings of fruit per day. A serving of fruit is: ? 1 medium whole fruit. ?  cup of dried fruit. ?  cup of fresh, frozen, or canned fruit. ?  cup of  100% fruit juice.  Eat more foods that have soluble fiber. These are apples, broccoli, carrots, beans, peas, and barley. Try to get 20-30 g of fiber per day.  Eat 4-5 servings of nuts, legumes, and seeds per week: ? 1 serving of dried beans or legumes equals  cup after being cooked. ? 1 serving of nuts is  cup. ? 1 serving of seeds equals 1 tablespoon. General information  Eat more home-cooked food. Eat less restaurant, buffet, and fast food.  Limit or avoid alcohol.  Limit foods that are high in starch and sugar.  Avoid fried foods.  Lose weight if you are overweight.  Keep track of how much salt (sodium) you eat. This is important if you have high blood pressure. Ask your doctor to tell you more about this.  Try to add vegetarian meals each week. Fats  Choose healthy fats. These include olive oil and canola oil, flaxseeds, walnuts, almonds, and seeds.  Eat more omega-3 fats. These include salmon, mackerel, sardines, tuna, flaxseed oil, and ground flaxseeds. Try to eat fish at least 2 times each week.  Check food labels. Avoid foods with trans fats or high amounts of saturated fat.  Limit saturated fats. ? These are often found in animal products, such as meats, butter, and cream. ? These are also found in plant foods, such as palm oil, palm kernel oil, and coconut oil.  Avoid foods with partially hydrogenated oils in them. These have trans fats. Examples are stick margarine, some tub margarines, cookies, crackers, and other baked goods. What foods can I eat? Fruits All fresh, canned (in natural juice), or frozen fruits. Vegetables Fresh or frozen vegetables (raw, steamed, roasted, or grilled). Green salads. Grains Most grains. Choose whole wheat and whole grains most of the time. Rice and pasta, including brown rice and pastas made with whole wheat. Meats and other proteins Lean, well-trimmed beef, veal, pork, and lamb. Chicken and [place] without skin. All fish and  shellfish. Wild duck, rabbit, pheasant, and venison. Egg whites or low-cholesterol egg substitutes. Dried beans, peas, lentils, and tofu. Seeds and most nuts. Dairy Low-fat or nonfat cheeses, including ricotta and mozzarella. Skim or 1% milk that is liquid, powdered, or evaporated. Buttermilk that is made with low-fat milk. Nonfat or low-fat yogurt. Fats and oils Non-hydrogenated (trans-free) margarines. Vegetable oils, including soybean, sesame, sunflower, olive, peanut, safflower, corn, canola, and cottonseed. Salad dressings or mayonnaise made with a vegetable oil. Beverages Mineral water. Coffee and tea. Diet carbonated beverages. Sweets and desserts Sherbet, gelatin, and fruit ice. Small amounts of dark chocolate. Limit all sweets and desserts. Seasonings and condiments All seasonings and condiments. The items listed above may not be a complete list of foods and drinks you can eat. Contact a dietitian for more options. What foods should I avoid? Fruits Canned fruit in heavy syrup. Fruit in cream or butter sauce. Fried fruit. Limit coconut. Vegetables Vegetables cooked in cheese, cream, or butter sauce. Fried vegetables. Grains Breads that  are made with saturated or trans fats, oils, or whole milk. Croissants. Sweet rolls. Donuts. High-fat crackers, such as cheese crackers. Meats and other proteins Fatty meats, such as hot dogs, ribs, sausage, bacon, rib-eye roast or steak. High-fat deli meats, such as salami and bologna. Caviar. Domestic duck and goose. Organ meats, such as liver. Dairy Cream, sour cream, cream cheese, and creamed cottage cheese. Whole-milk cheeses. Whole or 2% milk that is liquid, evaporated, or condensed. Whole buttermilk. Cream sauce or high-fat cheese sauce. Yogurt that is made from whole milk. Fats and oils Meat fat, or shortening. Cocoa butter, hydrogenated oils, palm oil, coconut oil, palm kernel oil. Solid fats and shortenings, including bacon fat, salt pork,  lard, and butter. Nondairy cream substitutes. Salad dressings with cheese or sour cream. Beverages Regular sodas and juice drinks with added sugar. Sweets and desserts Frosting. Pudding. Cookies. Cakes. Pies. Milk chocolate or white chocolate. Buttered syrups. Full-fat ice cream or ice cream drinks. The items listed above may not be a complete list of foods and drinks to avoid. Contact a dietitian for more information. Summary  Heart-healthy meal planning includes eating less unhealthy fats, eating more healthy fats, and making other changes in your diet.  Eat a balanced diet. This includes fruits and vegetables, low-fat or nonfat dairy, lean protein, nuts and legumes, whole grains, and heart-healthy oils and fats. This information is not intended to replace advice given to you by your health care provider. Make sure you discuss any questions you have with your health care provider. Document Revised: 04/20/2017 Document Reviewed: 03/24/2017 Elsevier Patient Education  2020 [employer].

## 2019-12-04 NOTE — Progress Notes (Signed)
PCP: Dr Army Melia  Primary HF Cardiologist: Dr [name]  Cardiology: Dr End  CT Surgeon: Dr Darcey Nora  HPI: [name] is a 69 year old with a history of CAD, NSTEMI 2017 s/p PCI to LCX , LBBB, HTN, PAD, RCEA, left femoral stent (Cavagnaro term plavix), and hyperlipidemia.   Had ECHO 2019 that showed EF 40-45%   Presented to [facility] for outpatient heart cath. Cath showed left main disease. ECHO completed and showed EF 25-30% with normal RV. Transferred to [facility] for CT surgery consultation. Deemed appropriate for CABG. S/P CABG LIMA-LAD, SVG-OM1, SVG-OM2 and SVG-PDA. Post op he required impella. Had post op A fib and frequent PVCs. Placed IV amio and transitioned to po amio. Diuresed with IV lasix and transitioned to 40 mg po lasix daily.   Today he returns for HF follow up.Overall feeling ok. Denies SOB/PND/Orthopnea. Having some soreness in his neck but says this is getting better. Walking 1 mile 3-4 days a week. Has had some nausea. Appetite improving. No fever or chills. Weight at home 162-163 pounds. Taking all medications.  Cardiac Studies ECHO 2019 that showed EF 40-45% Echo 11/06/19 EF 25-30% RV normal   LHC 10/2019  1. Multivessel CAD, including calcified 80% distal LMCA stenosis, small diffusely diseased distal LAD, chronically occluded OM1, and 50% mid RCA lesion. 2. Widely patent mid LCx stent. 3. Moderately elevated left heart filling pressure. 4. Mildly to moderately elevated right heart filling pressure. 5. Normal to mildly reduced cardiac output/index.  ROS: All systems negative except as listed in HPI, PMH and Problem List.  SH:  Social History   Socioeconomic History  . Marital status: Married    Spouse name: Not on file  . Number of children: 1  . Years of education: Not on file  . Highest education level: Bachelor's degree (e.g., BA, AB, BS)  Occupational History  . Not on file  Tobacco Use  . Smoking status: Former Smoker    Packs/day: 1.25    Years: 35.00    Pack years:  43.75    Types: Cigarettes    Quit date: 02/28/2005    Years since quitting: 14.7  . Smokeless tobacco: Green User    Types: Snuff  . Tobacco comment: occaisionally  Vaping Use  . Vaping Use: Never used  Substance and Sexual Activity  . Alcohol use: Yes    Alcohol/week: 8.0 - 10.0 standard drinks    Types: 8 - 10 Glasses of wine per week  . Drug use: No  . Sexual activity: Yes  Other Topics Concern  . Not on file  Social History Narrative  . Not on file   Social Determinants of Health   Financial Resource Strain: Low Risk   . Difficulty of Paying Living Expenses: Not hard at all  Food Insecurity: No Food Insecurity  . Worried About Charity fundraiser in the Last Year: Never true  . Ran Out of Food in the Last Year: Never true  Transportation Needs: No Transportation Needs  . Lack of Transportation (Medical): No  . Lack of Transportation (Non-Medical): No  Physical Activity: Sufficiently Active  . Days of Exercise per Week: 4 days  . Minutes of Exercise per Session: 60 min  Stress: No Stress Concern Present  . Feeling of Stress : Not at all  Social Connections: Moderately Integrated  . Frequency of Communication with Friends and Family: More than three times a week  . Frequency of Social Gatherings with Friends and Family: Three times a  week  . Attends Religious Services: Never  . Active Member of Clubs or Organizations: Yes  . Attends Archivist Meetings: More than 4 times per year  . Marital Status: Married  Human resources officer Violence: Not At Risk  . Fear of Green or Ex-Partner: No  . Emotionally Abused: No  . Physically Abused: No  . Sexually Abused: No    FH:  Family History  Problem Relation Age of Onset  . Heart attack Mother   . Coronary artery disease Mother   . Heart disease Mother        Carotid Stenosis and BPG and Heart Disease before age 82  . Diabetes Mother   . Hypertension Mother   . Heart attack Father   . Heart disease Father         BPG and Heart Disease before age 66  . Hypertension Father   . Cancer Father 55       throat  . Stroke Father   . Colon cancer Neg Hx   . Colon polyps Neg Hx   . Esophageal cancer Neg Hx   . Rectal cancer Neg Hx   . Stomach cancer Neg Hx     Past Medical History:  Diagnosis Date  . Abnormal nuclear cardiac imaging test 08/08/2015  . Arthritis    fingers  . Carotid artery occlusion   . Coronary atherosclerosis of native coronary artery 01/29/2013   11/05/19 R/LHC 80% dLMCA stenosis small diffusely dz dLAD, chronically occluded OM1 50%mRCA lesion, widely patent mLCx strent, moderately elevated L heart filling pressures, mild to moderate RH filling pressures, normal to moderately reduced CO  . Duodenal erosion   . Encounter for screening for lung cancer 07/13/2016  . Esophageal stenosis    esophageal dilation  . GERD (gastroesophageal reflux disease)   . H. pylori infection   . Heart attack [facility]) Oct. 2009   Mild  . Hiatal hernia   . Hyperlipidemia   . Hypertension   . 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 Cardiac catheterization-nonobstructive 60% RCA disease-subsequent nuclear stress test-9 minutes, low risk, mild inferior wall hypokinesis   . Pain in limb 12/19/2017  . Peripheral vascular disease ([place])   . Unstable angina ([place]) 11/25/2017    Green Outpatient Medications  Medication Sig Dispense Refill  . Alirocumab (PRALUENT) 150 MG/ML SOAJ Inject 1 pen into the skin every 14 (fourteen) days. 2 mL 11  . amiodarone (PACERONE) 200 MG tablet Take 200 mg by mouth daily.    Marland Kitchen apixaban (ELIQUIS) 5 MG TABS tablet Take 1 tablet (5 mg total) by mouth 2 (two) times daily. 60 tablet 2  . aspirin EC 81 MG tablet Take 1 tablet (81 mg total) by mouth daily. 90 tablet 3  . clonazePAM (KLONOPIN) 0.25 MG disintegrating tablet Take 1 tablet (0.25 mg total) by mouth daily. 30 tablet 2  . colchicine 0.6 MG tablet Take 1 tablet (0.6  mg total) by mouth 2 (two) times daily. 30 tablet 1  . digoxin (LANOXIN) 0.125 MG tablet Take 1 tablet (0.125 mg total) by mouth daily. 30 tablet 1  . nitroGLYCERIN (NITROSTAT) 0.4 MG SL tablet Place 1 tablet (0.4 mg total) under the tongue every 5 (five) minutes as needed for chest pain. 25 tablet 2  . pantoprazole (PROTONIX) 40 MG tablet Take 1 tablet (40 mg total) by mouth daily. 90 tablet 1  . sacubitril-valsartan (ENTRESTO) 24-26 MG Take 1 tablet by mouth 2 (  two) times daily. 60 tablet 2  . spironolactone (ALDACTONE) 25 MG tablet Take 1 tablet (25 mg total) by mouth daily. 30 tablet 2  . traZODone (DESYREL) 50 MG tablet Take 1 tablet (50 mg total) by mouth at bedtime as needed for sleep. 30 tablet 0  . triamcinolone cream (KENALOG) 0.1 % Apply 1 application topically 2 (two) times daily as needed for itching.    . furosemide (LASIX) 40 MG tablet Take 1 tablet (40 mg total) by mouth daily. (Patient not taking: Reported on 12/05/2019) 30 tablet 1  . potassium chloride SA (KLOR-CON) 20 MEQ tablet Take 20 mEq by mouth daily. (Patient not taking: Reported on 12/05/2019)     No Green facility-administered medications for this encounter.    Vitals:   12/05/19 1013  BP: (!) 102/56  Pulse: 71  SpO2: 96%  Weight: 76.3 kg (168 lb 2 oz)   Wt Readings from Last 3 Encounters:  12/05/19 76.3 kg (168 lb 2 oz)  12/04/19 76.3 kg (168 lb 4 oz)  11/27/19 73.9 kg (163 lb)    PHYSICAL EXAM: General:  Well appearing. No resp difficulty HEENT: normal Neck: supple. JVP flat. Carotids 2+ bilaterally; no bruits. No lymphadenopathy or thryomegaly appreciated. Cor: PMI normal. Regular rate & rhythm. No rubs, gallops or murmurs. Lungs: clear Abdomen: soft, nontender, nondistended. No hepatosplenomegaly. No bruits or masses. Good bowel sounds. Extremities: no cyanosis, clubbing, rash, edema Neuro: alert & orientedx3, cranial nerves grossly intact. Moves all 4 extremities w/o difficulty. Affect  pleasant.  ASSESSMENT & PLAN: 1. CAD: H/o PCI to LCX in 2017. LHC 9/7/21with multivessel CAD, including calcified 80% distal LM stenosis, small diffusely diseased distal LAD, chronically occluded OM1, and 50% mid RCA lesion.  He is now s/p LIMA-LAD, SVG-OM1, SVG-OM2 and SVG-PDA. - Intolerance of statins, he is on Praluent at home. - Stop aspirin. On apixaban.   2. Chronic systolic CHF: Ischemic cardiomyopathy.  Echo pre-op with EF 25-30%.  Impella 5.5 removed 9/17.   -NYHA II. Volume status stable.  - Dig level 1.4. Stop digoxin today hopefully this will help with nausea.  - Consider bb versus SGT2i.    - Continue Entresto 24-26 mg bid. No room to titrate with soft BP.  - Continue Spiro 25 mg daily  - Change lasix and potassium to as needed for weight at 168 pounds or great. .   - If EF remains low after CABG, will be CRT-D candidate with LBBB.  3. PVCs: Frequent.  Now on amiodarone. Continue 200 mg daily.  4. Paroxysmal atrial fibrillation/flutter:  - Regular on exam.  - Continue amio 200 mg twice a day.  - Continue apixaban.   Follow up in 4 weeks with Dr [name].

## 2019-12-05 ENCOUNTER — Telehealth: Payer: Self-pay | Admitting: Family

## 2019-12-05 ENCOUNTER — Ambulatory Visit (HOSPITAL_COMMUNITY)
Admit: 2019-12-05 | Discharge: 2019-12-05 | Disposition: A | Discharge status: Home / Self Care | Payer: PPO | Source: Ambulatory Visit | Attending: Adult Health | Admitting: Adult Health

## 2019-12-05 VITALS — BP 102/56 | HR 71 | Wt 168.1 lb

## 2019-12-05 DIAGNOSIS — I5042 Chronic combined systolic (congestive) and diastolic (congestive) heart failure: Secondary | ICD-10-CM | POA: Diagnosis not present

## 2019-12-05 DIAGNOSIS — I4892 Unspecified atrial flutter: Secondary | ICD-10-CM | POA: Diagnosis not present

## 2019-12-05 DIAGNOSIS — Z951 Presence of aortocoronary bypass graft: Secondary | ICD-10-CM | POA: Diagnosis not present

## 2019-12-05 DIAGNOSIS — I252 Old myocardial infarction: Secondary | ICD-10-CM | POA: Insufficient documentation

## 2019-12-05 DIAGNOSIS — Z7901 Long term (current) use of anticoagulants: Secondary | ICD-10-CM | POA: Diagnosis not present

## 2019-12-05 DIAGNOSIS — Z8249 Family history of ischemic heart disease and other diseases of the circulatory system: Secondary | ICD-10-CM | POA: Diagnosis not present

## 2019-12-05 DIAGNOSIS — K219 Gastro-esophageal reflux disease without esophagitis: Secondary | ICD-10-CM | POA: Diagnosis not present

## 2019-12-05 DIAGNOSIS — E785 Hyperlipidemia, unspecified: Secondary | ICD-10-CM | POA: Insufficient documentation

## 2019-12-05 DIAGNOSIS — I110 Hypertensive heart disease with heart failure: Secondary | ICD-10-CM | POA: Insufficient documentation

## 2019-12-05 DIAGNOSIS — I447 Left bundle-branch block, unspecified: Secondary | ICD-10-CM | POA: Diagnosis not present

## 2019-12-05 DIAGNOSIS — I2510 Atherosclerotic heart disease of native coronary artery without angina pectoris: Secondary | ICD-10-CM | POA: Diagnosis not present

## 2019-12-05 DIAGNOSIS — I5022 Chronic systolic (congestive) heart failure: Secondary | ICD-10-CM | POA: Insufficient documentation

## 2019-12-05 DIAGNOSIS — Z7982 Long term (current) use of aspirin: Secondary | ICD-10-CM | POA: Insufficient documentation

## 2019-12-05 DIAGNOSIS — I480 Paroxysmal atrial fibrillation: Secondary | ICD-10-CM | POA: Diagnosis not present

## 2019-12-05 DIAGNOSIS — Z87891 Personal history of nicotine dependence: Secondary | ICD-10-CM | POA: Diagnosis not present

## 2019-12-05 DIAGNOSIS — Z79899 Other long term (current) drug therapy: Secondary | ICD-10-CM | POA: Diagnosis not present

## 2019-12-05 DIAGNOSIS — I739 Peripheral vascular disease, unspecified: Secondary | ICD-10-CM | POA: Insufficient documentation

## 2019-12-05 DIAGNOSIS — M542 Cervicalgia: Secondary | ICD-10-CM | POA: Diagnosis not present

## 2019-12-05 DIAGNOSIS — I255 Ischemic cardiomyopathy: Secondary | ICD-10-CM | POA: Diagnosis not present

## 2019-12-05 DIAGNOSIS — R110 Nausea: Secondary | ICD-10-CM | POA: Diagnosis not present

## 2019-12-05 DIAGNOSIS — Z7902 Long term (current) use of antithrombotics/antiplatelets: Secondary | ICD-10-CM | POA: Diagnosis not present

## 2019-12-05 DIAGNOSIS — Z8774 Personal history of (corrected) congenital malformations of heart and circulatory system: Secondary | ICD-10-CM | POA: Insufficient documentation

## 2019-12-05 LAB — CBC WITH DIFFERENTIAL/PLATELET
Basophils Absolute: 0.1 10*3/uL (ref 0.0–0.2)
Basos: 1 %
EOS (ABSOLUTE): 0.5 10*3/uL — ABNORMAL HIGH (ref 0.0–0.4)
Eos: 9 %
Hematocrit: 35.3 % — ABNORMAL LOW (ref 37.5–51.0)
Hemoglobin: 11.9 g/dL — ABNORMAL LOW (ref 13.0–17.7)
Immature Grans (Abs): 0 10*3/uL (ref 0.0–0.1)
Immature Granulocytes: 1 %
Lymphocytes Absolute: 1 10*3/uL (ref 0.7–3.1)
Lymphs: 19 %
MCH: 30.1 pg (ref 26.6–33.0)
MCHC: 33.7 g/dL (ref 31.5–35.7)
MCV: 89 fL (ref 79–97)
Monocytes Absolute: 0.7 10*3/uL (ref 0.1–0.9)
Monocytes: 13 %
Neutrophils Absolute: 3.1 10*3/uL (ref 1.4–7.0)
Neutrophils: 57 %
Platelets: 326 10*3/uL (ref 150–450)
RBC: 3.95 x10E6/uL — ABNORMAL LOW (ref 4.14–5.80)
RDW: 12.3 % (ref 11.6–15.4)
WBC: 5.3 10*3/uL (ref 3.4–10.8)

## 2019-12-05 LAB — BASIC METABOLIC PANEL
BUN/Creatinine Ratio: 24 (ref 10–24)
BUN: 29 mg/dL — ABNORMAL HIGH (ref 8–27)
CO2: 25 mmol/L (ref 20–29)
Calcium: 9.3 mg/dL (ref 8.6–10.2)
Chloride: 98 mmol/L (ref 96–106)
Creatinine, Ser: 1.2 mg/dL (ref 0.76–1.27)
GFR calc Af Amer: 71 mL/min/{1.73_m2} (ref >59)
GFR calc non Af Amer: 61 mL/min/{1.73_m2} (ref >59)
Glucose: 115 mg/dL — ABNORMAL HIGH (ref 65–99)
Potassium: 4.8 mmol/L (ref 3.5–5.2)
Sodium: 137 mmol/L (ref 134–144)

## 2019-12-05 LAB — DIGOXIN LEVEL: Digoxin, Serum: 1.4 ng/mL — ABNORMAL HIGH (ref 0.5–0.9)

## 2019-12-05 LAB — BRAIN NATRIURETIC PEPTIDE: BNP: 202.7 pg/mL — ABNORMAL HIGH (ref 0.0–100.0)

## 2019-12-05 MED ORDER — FUROSEMIDE 40 MG PO TABS
40.0000 mg | ORAL_TABLET | Freq: Every day | ORAL | 1 refills | Status: DC | PRN
Start: 2019-12-05 — End: 2019-12-09

## 2019-12-05 MED ORDER — POTASSIUM CHLORIDE CRYS ER 20 MEQ PO TBCR: 20.0000 meq | EXTENDED_RELEASE_TABLET | Freq: Every day | ORAL | Status: DC | PRN

## 2019-12-05 NOTE — Telephone Encounter (Signed)
Ronald Dubonnet, NP  12/05/2019 8:05 AM EDT     Digoxin level mildly elevated which could contribute to nausea. HOLD digoxin today then resume tomorrow at reduced dose of 0.0625mg  daily. Repeat digoxin level/BMP in 1 week at [facility].   CBC shows continued improvement in Hb from discharge.   Renal function stable. BUN mildly elevated. Normal electrolytes. Please reduce Lasix to 20mg  daily and stop potassium.   Will readdress medications after repeat lab work in 1 week. Hopeful to be able to add beta blocker for optimization of HF therapies.

## 2019-12-05 NOTE — Patient Instructions (Signed)
Stop Digoxin  Decrease Furosemide and Potassium to AS NEEDED Only for weight 168 lbs or greater  Your physician recommends that you schedule a follow-up appointment in: 4 weeks with Dr [name]  Your physician recommends that you schedule a follow-up appointment in: 3 months with echocardiogram   If you have any questions or concerns before your next appointment please send [place] a message through [facility] or call our office at [phone].    TO LEAVE A MESSAGE FOR THE NURSE SELECT OPTION 2, PLEASE LEAVE A MESSAGE INCLUDING: . YOUR NAME . DATE OF BIRTH . CALL BACK NUMBER . REASON FOR CALL**this is important as we prioritize the call backs  [place] AS YOU CALL BEFORE 4:00 PM  At the [facility], you and your health needs are our priority. As part of our continuing mission to provide you with exceptional heart care, we have created designated Provider Care Teams. These Care Teams include your primary Cardiologist (physician) and Advanced Practice Providers (APPs- Physician Assistants and Nurse Practitioners) who all work together to provide you with the care you need, when you need it.   You may see any of the following providers on your designated Care Team at your next follow up: Marland Kitchen Dr Glori Bickers . Dr Loralie Champagne . Darrick Grinder, NP . Lyda Jester, PA . Audry Riles, PharmD   Please be sure to bring in all your medications bottles to every appointment.

## 2019-12-05 NOTE — Telephone Encounter (Signed)
Attempted to call the patient. No answer- I left a message to please call back.  

## 2019-12-09 MED ORDER — DIGOXIN 125 MCG PO TABS: 0.0625 mg | ORAL_TABLET | Freq: Every day | ORAL | 0 refills | Status: DC

## 2019-12-09 MED ORDER — FUROSEMIDE 20 MG PO TABS: 20.0000 mg | ORAL_TABLET | Freq: Every day | ORAL | Status: DC | PRN

## 2019-12-09 NOTE — Telephone Encounter (Signed)
Pt reports that he has stopped his Digoxin last Thursday and restarted this past Sunday 12/08/19 0.0625 mg. He says that his nausea has much improved   He had decreased his Lasix to 20 mg starting this past Friday 12/06/19 and has stopped his K... he says that he saw another MD in [place] that had access to [facility] recommendation re: his meds and had informed them already.   [facility] labwork per Laurann Montana NP.   Pt to call back if he has any questions or problems.

## 2019-12-09 NOTE — Telephone Encounter (Signed)
Attempt #2 to call the pt... Left message for him to call our office back.

## 2019-12-11 ENCOUNTER — Telehealth: Payer: Self-pay | Admitting: *Deleted

## 2019-12-11 NOTE — Telephone Encounter (Signed)
  Chronic Care Management   Chart Review Note  12/11/2019 Name: [name] MRN: [id number] DOB: 1950-10-07  Patient is not seeing [facility] for PCP services. He has been removed from the embedded CCM program with [facility]   Follow up plan: No follow-up required  Chong Sicilian, BSN, RN-BC [place] [facility] [place] [facility] Direct Dial: [phone]

## 2019-12-12 ENCOUNTER — Other Ambulatory Visit: Payer: Self-pay | Admitting: Cardiothoracic Surgery

## 2019-12-12 DIAGNOSIS — Z951 Presence of aortocoronary bypass graft: Secondary | ICD-10-CM

## 2019-12-13 ENCOUNTER — Telehealth: Payer: Self-pay | Admitting: Internal Medicine

## 2019-12-13 ENCOUNTER — Other Ambulatory Visit
Admission: RE | Admit: 2019-12-13 | Discharge: 2019-12-13 | Disposition: A | Discharge status: Home / Self Care | Payer: PPO | Attending: Family | Admitting: Family

## 2019-12-13 DIAGNOSIS — I5042 Chronic combined systolic (congestive) and diastolic (congestive) heart failure: Secondary | ICD-10-CM | POA: Diagnosis not present

## 2019-12-13 DIAGNOSIS — Z79899 Other long term (current) drug therapy: Secondary | ICD-10-CM | POA: Insufficient documentation

## 2019-12-13 LAB — BASIC METABOLIC PANEL
Anion gap: 11 (ref 5–15)
BUN: 25 mg/dL — ABNORMAL HIGH (ref 8–23)
CO2: 25 mmol/L (ref 22–32)
Calcium: 9.2 mg/dL (ref 8.9–10.3)
Chloride: 98 mmol/L (ref 98–111)
Creatinine, Ser: 1.22 mg/dL (ref 0.61–1.24)
GFR, Estimated: >60 mL/min (ref >60)
Glucose, Bld: 115 mg/dL — ABNORMAL HIGH (ref 70–99)
Potassium: 4.2 mmol/L (ref 3.5–5.1)
Sodium: 134 mmol/L — ABNORMAL LOW (ref 135–145)

## 2019-12-13 LAB — DIGOXIN LEVEL: Digoxin Level: 0.5 ng/mL — ABNORMAL LOW (ref 1.0–2.0)

## 2019-12-13 NOTE — Telephone Encounter (Signed)
Called patient. Reports he has been having ongoing nausea and body aches since his procedure. It feels like when he took statin's in the past. His legs, arms and body ache. Nausea comes and goes throughout the day. He has spaced out his medications and takes some in the morning and some in the evening with food.   He is very grateful for all our help and does not want to sound like he's complaining. He thinks its something with his medications that is making him feel this way. He had lab work today and digoxin is still pending. Advised I will make Laurann Montana, NP aware and will be in touch.

## 2019-12-13 NOTE — Telephone Encounter (Signed)
Patient calling  States that he has been nauseated and achey but does not know what it is - denies SOB or chest pain  Would like to discuss- please call

## 2019-12-13 NOTE — Telephone Encounter (Signed)
Seen by me in follow up after CABG 12/04/19. Nausea presumed due to elevated digoxin level. This was discontinued and his digoxin level today was appropriate. Sorry to hear he is still nauseous.  Seen by Darrick Grinder, NP in HF clinic 12/05/19. At that time he was recommended to STOP Aspirin, Digoxin (does not appear was removed from med list). CHANGE Lasix and Potassium to only as needed for weight 168 pounds or greater. CONTINUE Entresto 24-26mg  BID, Spironolactone 25mg  daily.   Please ensure he is taking meds as recommended at HF clinic visit & update med list Please ensure taking Entresto doses approx 12 hours apart Amiodarone can cause nausea, please discontinue Amiodarone. It does take time for this medication to get out of one's system so it may take some time to notice improvement.   Best, Loel Dubonnet, NP

## 2019-12-16 MED ORDER — POTASSIUM CHLORIDE CRYS ER 20 MEQ PO TBCR: 20.0000 meq | EXTENDED_RELEASE_TABLET | Freq: Every day | ORAL | 3 refills | Status: DC | PRN

## 2019-12-16 NOTE — Telephone Encounter (Signed)
Ronald Dubonnet, NP  [name], Ronald Ivory, RN Digoxin level now normal after discontinuing medication - good result! Recommend remaining off Digoxin. Kidney function stable. Sodium just mildly low, recommend strict <2L fluid restriction to prevent low sodium levels. Recommend repeat BMP in 1 week for reassessment of sodium level.   -------------------------------------------------------------------------------------  Spoke to patient and wife on speaker phone. They verbalized understanding of previous entry instructions and above lab results. Wife read back the medication recommendations correctly. Med list has been updated.   Patient would also like to get repeat lab work at the [facility] in [place] since he lives closer to there. Patient will go on Friday to have done. BMET order faxed to [facility] at [phone].

## 2019-12-18 ENCOUNTER — Ambulatory Visit (INDEPENDENT_AMBULATORY_CARE_PROVIDER_SITE_OTHER): Payer: Self-pay | Admitting: Cardiothoracic Surgery

## 2019-12-18 ENCOUNTER — Other Ambulatory Visit: Payer: Self-pay | Admitting: Internal Medicine

## 2019-12-18 ENCOUNTER — Ambulatory Visit: Payer: PPO | Admitting: Cardiothoracic Surgery

## 2019-12-18 ENCOUNTER — Ambulatory Visit
Admission: RE | Admit: 2019-12-18 | Discharge: 2019-12-18 | Disposition: A | Discharge status: Home / Self Care | Payer: PPO | Source: Ambulatory Visit | Attending: Cardiothoracic Surgery | Admitting: Cardiothoracic Surgery

## 2019-12-18 ENCOUNTER — Encounter: Payer: Self-pay | Admitting: Cardiothoracic Surgery

## 2019-12-18 ENCOUNTER — Other Ambulatory Visit: Payer: Self-pay

## 2019-12-18 VITALS — BP 106/70 | HR 84 | Temp 97.6°F | Resp 20 | Ht 66.0 in | Wt 169.0 lb

## 2019-12-18 DIAGNOSIS — Z951 Presence of aortocoronary bypass graft: Secondary | ICD-10-CM

## 2019-12-18 DIAGNOSIS — Z9889 Other specified postprocedural states: Secondary | ICD-10-CM | POA: Diagnosis not present

## 2019-12-18 DIAGNOSIS — J9811 Atelectasis: Secondary | ICD-10-CM | POA: Diagnosis not present

## 2019-12-18 DIAGNOSIS — I517 Cardiomegaly: Secondary | ICD-10-CM | POA: Diagnosis not present

## 2019-12-18 DIAGNOSIS — I2510 Atherosclerotic heart disease of native coronary artery without angina pectoris: Secondary | ICD-10-CM

## 2019-12-18 DIAGNOSIS — I700 Atherosclerosis of aorta: Secondary | ICD-10-CM | POA: Diagnosis not present

## 2019-12-18 MED ORDER — APIXABAN 5 MG PO TABS: 5.0000 mg | ORAL_TABLET | Freq: Two times a day (BID) | ORAL | 1 refills | Status: DC

## 2019-12-18 MED ORDER — TRAMADOL HCL 50 MG PO TABS
50.0000 mg | ORAL_TABLET | Freq: Four times a day (QID) | ORAL | 0 refills | Status: DC | PRN
Start: 2019-12-18 — End: 2020-10-02

## 2019-12-18 MED ORDER — SPIRONOLACTONE 25 MG PO TABS
25.0000 mg | ORAL_TABLET | Freq: Every day | ORAL | 1 refills | Status: DC
Start: 2019-12-18 — End: 2020-03-19

## 2019-12-18 MED ORDER — SACUBITRIL-VALSARTAN 24-26 MG PO TABS
1.0000 | ORAL_TABLET | Freq: Two times a day (BID) | ORAL | 1 refills | Status: DC
Start: 2019-12-18 — End: 2020-01-30

## 2019-12-18 NOTE — Telephone Encounter (Signed)
REFILLS OK, REFILLS SENT.

## 2019-12-18 NOTE — Telephone Encounter (Signed)
Please advise if to refill medications Entresto and Spironolactone last filled by  Antony Odea, PA-C

## 2019-12-18 NOTE — Telephone Encounter (Signed)
*  STAT* If patient is at the pharmacy, call can be transferred to refill team.   1. Which medications need to be refilled? (please list name of each medication and dose if known)  Eliquis 5 MG 1 tablet 2 times daily Entresto 24-26 MG 1 tablet 2 times daily  Spironolactone 25 MG 1 tablet daily   2. Which pharmacy/location (including street and city if local pharmacy) is medication to be sent to? [facility] [place]   3. Do they need a 30 day or 90 day supply? 90 day

## 2019-12-18 NOTE — Progress Notes (Signed)
PCP is Glean Hess, MD Referring Provider is End, Harrell Gave, MD  Chief Complaint  Patient presents with  . Routine Post Op    f/u from surgery with CXR s/p CABG x4    HPI: Patient returns for scheduled postop visit after CABG x4 with Impella 5.5 support for treatment of ischemic cardiomyopathy, low EF and severe CAD.  He is followed by the advanced heart failure clinic.  He is on Eliquis for some postoperative atrial fibrillation.  He is also on heart failure medication as directed by the heart failure service.  He has had no bleeding from his Eliquis.  He denies any symptoms of angina or CHF.  Weight has been stable.  He is walking daily.  Chest x-ray today is clear without pleural effusion.  Sternal wires are intact and well aligned.  Past Medical History:  Diagnosis Date  . Abnormal nuclear cardiac imaging test 08/08/2015  . Arthritis    fingers  . Carotid artery occlusion   . Coronary atherosclerosis of native coronary artery 01/29/2013   11/05/19 R/LHC 80% dLMCA stenosis small diffusely dz dLAD, chronically occluded OM1 50%mRCA lesion, widely patent mLCx strent, moderately elevated L heart filling pressures, mild to moderate RH filling pressures, normal to moderately reduced CO  . Duodenal erosion   . Encounter for screening for lung cancer 07/13/2016  . Esophageal stenosis    esophageal dilation  . GERD (gastroesophageal reflux disease)   . H. pylori infection   . Heart attack [facility]) Oct. 2009   Mild  . Hiatal hernia   . Hyperlipidemia   . Hypertension   . 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 Cardiac catheterization-nonobstructive 60% RCA disease-subsequent nuclear stress test-9 minutes, low risk, mild inferior wall hypokinesis   . Pain in limb 12/19/2017  . Peripheral vascular disease ([place])   . Unstable angina ([place]) 11/25/2017    Past Surgical History:  Procedure Laterality Date  . APPENDECTOMY    . BACK  SURGERY    . CARDIAC CATHETERIZATION N/A 08/07/2015   Procedure: Left Heart Cath and Coronary Angiography;  Surgeon: [name], MD;  Location: [place] [facility];  Service: Cardiovascular;  Laterality: N/A;  . CARDIAC CATHETERIZATION N/A 08/07/2015   Procedure: Coronary Stent Intervention;  Surgeon: [name], MD;  Location: [place] [facility];  Service: Cardiovascular;  Laterality: N/A;  . CARDIAC CATHETERIZATION N/A 08/07/2015   Procedure: Coronary Stent Intervention;  Surgeon: Rondall Radigan M Martinique, MD;  Location: [place] [facility];  Service: Cardiovascular;  Laterality: N/A;  . CAROTID ENDARTERECTOMY  01/05/2006   Right  CEA with DPA  . CATARACT EXTRACTION W/ INTRAOCULAR LENS IMPLANT Left 12/04/2017  . CATARACT EXTRACTION W/PHACO Left 12/04/2017   Procedure: CATARACT EXTRACTION PHACO AND INTRAOCULAR LENS PLACEMENT ([place]) LEFT;  Surgeon: Eulogio Bear, MD;  Location: [place];  Service: Ophthalmology;  Laterality: Left;  . CATARACT EXTRACTION W/PHACO Right 02/06/2018   Procedure: CATARACT EXTRACTION PHACO AND INTRAOCULAR LENS PLACEMENT (IOC)RIGHT;  Surgeon: Eulogio Bear, MD;  Location: [place];  Service: Ophthalmology;  Laterality: Right;  . COLONOSCOPY  05/20/2008  . COLONOSCOPY WITH PROPOFOL N/A 09/13/2018   Procedure: COLONOSCOPY WITH BIOPSY;  Surgeon: [name], MD;  Location: [place];  Service: Endoscopy;  Laterality: N/A;  . CORONARY ARTERY BYPASS GRAFT N/A 11/11/2019   Procedure: CORONARY ARTERY BYPASS GRAFTING (CABG) USING LIMA to Diag1; ENDOSCOPICALLY HARVESTED RIGHT GREATER SAPHENOUS VEIN: SVG to OM1; SVG to  OM2; SVG to PDA.;  Surgeon: Ivin Poot, MD;  Location: [place];  Service: Open Heart Surgery;  Laterality: N/A;  . CORONARY STENT PLACEMENT  08/07/2015   MID CIRCUMFLEX  . ENDOVEIN HARVEST OF GREATER SAPHENOUS VEIN Right 11/11/2019   Procedure: ENDOVEIN HARVEST OF GREATER SAPHENOUS VEIN;  Surgeon: Ivin Poot, MD;  Location: [place];  Service: Open Heart Surgery;  Laterality: Right;  . ESOPHAGOGASTRODUODENOSCOPY (EGD) WITH PROPOFOL N/A 02/11/2019   Procedure: ESOPHAGOGASTRODUODENOSCOPY (EGD) WITH BIOPSY and  Dilation;  Surgeon: [name], MD;  Location: [place];  Service: Endoscopy;  Laterality: N/A;  . HIP SURGERY Left 10/2016   left hip tendon repair  . LEFT HEART CATH AND CORONARY ANGIOGRAPHY N/A 11/27/2017   Procedure: LEFT HEART CATH AND CORONARY ANGIOGRAPHY;  Surgeon: Wellington Hampshire, MD;  Location: [place] [facility];  Service: Cardiovascular;  Laterality: N/A;  . LOWER EXTREMITY ANGIOGRAPHY Left 02/12/2018   Procedure: LOWER EXTREMITY ANGIOGRAPHY;  Surgeon: Algernon Huxley, MD;  Location: [place] [facility];  Service: Cardiovascular;  Laterality: Left;  . LOWER EXTREMITY ANGIOGRAPHY Left 03/07/2018   Procedure: LOWER EXTREMITY ANGIOGRAPHY;  Surgeon: Algernon Huxley, MD;  Location: [place] [facility];  Service: Cardiovascular;  Laterality: Left;  . LOWER EXTREMITY ANGIOGRAPHY Left 06/04/2018   Procedure: LOWER EXTREMITY ANGIOGRAPHY;  Surgeon: Algernon Huxley, MD;  Location: [place] [facility];  Service: Cardiovascular;  Laterality: Left;  . PLACEMENT OF IMPELLA LEFT VENTRICULAR ASSIST DEVICE N/A 11/11/2019   Procedure: PLACEMENT OF IMPELLA LEFT VENTRICULAR ASSIST DEVICE 5.5;  Surgeon: Ivin Poot, MD;  Location: [place];  Service: Open Heart Surgery;  Laterality: N/A;  Midline Sternotomy  . POLYPECTOMY N/A 09/13/2018   Procedure: POLYPECTOMY;  Surgeon: [name], MD;  Location: [place];  Service: Endoscopy;  Laterality: N/A;  . POLYPECTOMY N/A 02/11/2019   Procedure: POLYPECTOMY;  Surgeon: [name], MD;  Location: [place];  Service: Endoscopy;  Laterality: N/A;  . REMOVAL OF IMPELLA LEFT VENTRICULAR ASSIST DEVICE N/A 11/15/2019   Procedure: REMOVAL OF IMPELLA 5.5 LEFT VENTRICULAR ASSIST DEVICE;  Surgeon: Ivin Poot, MD;  Location: [place];  Service: Open Heart Surgery;   Laterality: N/A;  . RIGHT/LEFT HEART CATH AND CORONARY ANGIOGRAPHY N/A 11/05/2019   Procedure: RIGHT/LEFT HEART CATH AND CORONARY ANGIOGRAPHY;  Surgeon: Nelva Bush, MD;  Location: [place] [facility];  Service: Cardiovascular;  Laterality: N/A;  . SPINE SURGERY    . TEE WITHOUT CARDIOVERSION N/A 11/11/2019   Procedure: TRANSESOPHAGEAL ECHOCARDIOGRAM (TEE);  Surgeon: Prescott Gum, Collier Salina, MD;  Location: [place];  Service: Open Heart Surgery;  Laterality: N/A;  . TEE WITHOUT CARDIOVERSION N/A 11/15/2019   Procedure: TRANSESOPHAGEAL ECHOCARDIOGRAM (TEE);  Surgeon: Prescott Gum, Collier Salina, MD;  Location: [place];  Service: Open Heart Surgery;  Laterality: N/A;  . TONSILLECTOMY      Family History  Problem Relation Age of Onset  . Heart attack Mother   . Coronary artery disease Mother   . Heart disease Mother        Carotid Stenosis and BPG and Heart Disease before age 3  . Diabetes Mother   . Hypertension Mother   . Heart attack Father   . Heart disease Father        BPG and Heart Disease before age 20  . Hypertension Father   . Cancer Father 55       throat  . Stroke Father   . Colon cancer Neg Hx   .  Colon polyps Neg Hx   . Esophageal cancer Neg Hx   . Rectal cancer Neg Hx   . Stomach cancer Neg Hx     Social History Social History   Tobacco Use  . Smoking status: Former Smoker    Packs/day: 1.25    Years: 35.00    Pack years: 43.75    Types: Cigarettes    Quit date: 02/28/2005    Years since quitting: 14.8  . Smokeless tobacco: Current User    Types: Snuff  . Tobacco comment: occaisionally  Vaping Use  . Vaping Use: Never used  Substance Use Topics  . Alcohol use: Yes    Alcohol/week: 8.0 - 10.0 standard drinks    Types: 8 - 10 Glasses of wine per week  . Drug use: No    Current Outpatient Medications  Medication Sig Dispense Refill  . Alirocumab (PRALUENT) 150 MG/ML SOAJ Inject 1 pen into the skin every 14 (fourteen) days. 2 mL 11  . apixaban (ELIQUIS) 5 MG TABS tablet  Take 1 tablet (5 mg total) by mouth 2 (two) times daily. 60 tablet 2  . clonazePAM (KLONOPIN) 0.25 MG disintegrating tablet Take 1 tablet (0.25 mg total) by mouth daily. 30 tablet 2  . colchicine 0.6 MG tablet Take 1 tablet (0.6 mg total) by mouth 2 (two) times daily. 30 tablet 1  . furosemide (LASIX) 20 MG tablet Take 1 tablet (20 mg total) by mouth daily as needed (for weight 168 lb or greater).    . nitroGLYCERIN (NITROSTAT) 0.4 MG SL tablet Place 1 tablet (0.4 mg total) under the tongue every 5 (five) minutes as needed for chest pain. 25 tablet 2  . pantoprazole (PROTONIX) 40 MG tablet Take 1 tablet (40 mg total) by mouth daily. 90 tablet 1  . potassium chloride SA (KLOR-CON) 20 MEQ tablet Take 1 tablet (20 mEq total) by mouth daily as needed (on days your take your furosemide.). 90 tablet 3  . sacubitril-valsartan (ENTRESTO) 24-26 MG Take 1 tablet by mouth 2 (two) times daily. 60 tablet 2  . spironolactone (ALDACTONE) 25 MG tablet Take 1 tablet (25 mg total) by mouth daily. 30 tablet 2  . traMADol (ULTRAM) 50 MG tablet Take 1 tablet (50 mg total) by mouth every 6 (six) hours as needed. 40 tablet 0  . traZODone (DESYREL) 50 MG tablet Take 1 tablet (50 mg total) by mouth at bedtime as needed for sleep. 30 tablet 0  . triamcinolone cream (KENALOG) 0.1 % Apply 1 application topically 2 (two) times daily as needed for itching.     No current facility-administered medications for this visit.    Allergies  Allergen Reactions  . Brilinta [Ticagrelor] Shortness Of Breath  . Chlorhexidine Gluconate Other (See Comments)    Skin burning for hours afterward  . Statins Other (See Comments)    Failed Crestor 5 mg twice weekly, Crestor 20 mg daily, Pravastatin 40 mg qd, Lipitor, Zocor - muscle aches  . Zetia [Ezetimibe] Other (See Comments)    Muscle aches    Review of Systems  Appetite improving Strength improving Sleeping pattern improved No problems with incisions  BP 106/70   Pulse 84    Temp 97.6 F (36.4 C) (Skin)   Resp 20   Ht 5\' 6"  (1.676 m)   Wt 169 lb (76.7 kg)   SpO2 95% Comment: RA  BMI 27.28 kg/m  Physical Exam      Exam    General- alert and comfortable.  Sternal  incision healing well.    Neck- no JVD, no cervical adenopathy palpable, no carotid bruit   Lungs- clear without rales, wheezes   Cor- regular rate and rhythm, no murmur , gallop   Abdomen- soft, non-tender   Extremities - warm, non-tender, minimal edema   Neuro- oriented, appropriate, no focal weakness   Diagnostic Tests: Chest x-ray image personally reviewed with results as noted above  Impression: Excellent early recovery after CABG for ischemic cardiomyopathy.  Patient may now drive, lift up to 10 pounds, and the goal is to walk 1 mile daily.  He defers outpatient cardiac rehab and preference for his own walking program  Plan: I will see him back in 6 weeks for review of progress.  Len Childs, MD [facility] [phone]

## 2019-12-19 ENCOUNTER — Other Ambulatory Visit: Payer: Self-pay | Admitting: Cardiothoracic Surgery

## 2019-12-19 ENCOUNTER — Telehealth: Payer: Self-pay | Admitting: Family

## 2019-12-19 NOTE — Telephone Encounter (Signed)
Please call regarding [name]. States he is unable to afford this.

## 2019-12-19 NOTE — Telephone Encounter (Signed)
Caitlin,   I'm assuming he will be on eliquis Fralix term. He had post-op a-fib. I am trying to figure out what I can offer him. We are waiting on samples to come to the office. I can try him on patient assistance as well.  Just trying to make sure this will be [place] term though.   Thanks!

## 2019-12-19 NOTE — Telephone Encounter (Signed)
Hospitalized with HFrEF and underwent CABG. He had postoperative atrial fib requiring Amiodarone and anticoagulation (Eliquis). Typically we leave people on Amiodarone for 3 months after CABG prior to discontinuing and assessing for recurrent atrial fibrillation. I would anticipate him being on the Eliquis for at least 6 months.   Spoke with pharmacy. Price for 60 tablets was $143.60 (of note, [name] is awaiting prior auth so we may need to start patient assistance for this as well).  Spoke with insurance company. He has Medicare Part D and has reached the "donut hole". Prior to hitting the donut hole his co-pay would be $45 for 1 month and $90 for 3 months. The "donut hole" resets in January. Asked about Xarelto, cost similar as both Tier 3 medications.    Can he afford the higher priced Eliquis for 3 months until reset? Otherwise provide samples. He does also follow with HF clinic in [place]. Not sure if they have samples (has appt upcoming 01/07/20 with Dr. [name]). Let me know what I can do to assist!  Ronald Dubonnet, NP

## 2019-12-20 ENCOUNTER — Other Ambulatory Visit
Admission: RE | Admit: 2019-12-20 | Discharge: 2019-12-20 | Disposition: A | Discharge status: Home / Self Care | Payer: PPO | Attending: Family | Admitting: Family

## 2019-12-20 ENCOUNTER — Other Ambulatory Visit: Payer: Self-pay

## 2019-12-20 DIAGNOSIS — I5042 Chronic combined systolic (congestive) and diastolic (congestive) heart failure: Secondary | ICD-10-CM | POA: Insufficient documentation

## 2019-12-20 DIAGNOSIS — Z79899 Other long term (current) drug therapy: Secondary | ICD-10-CM | POA: Insufficient documentation

## 2019-12-20 LAB — BASIC METABOLIC PANEL WITH GFR
Anion gap: 10 (ref 5–15)
BUN: 22 mg/dL (ref 8–23)
CO2: 26 mmol/L (ref 22–32)
Calcium: 8.9 mg/dL (ref 8.9–10.3)
Chloride: 103 mmol/L (ref 98–111)
Creatinine, Ser: 0.98 mg/dL (ref 0.61–1.24)
GFR, Estimated: >60 mL/min
Glucose, Bld: 119 mg/dL — ABNORMAL HIGH (ref 70–99)
Potassium: 4.5 mmol/L (ref 3.5–5.1)
Sodium: 139 mmol/L (ref 135–145)

## 2019-12-20 NOTE — Telephone Encounter (Signed)
Patient returning call.   He says on a fixed income the Eliquis is not affordable   He is interested in samples and patient assistance forms .    Please call.

## 2019-12-20 NOTE — Telephone Encounter (Signed)
I spoke with the patient regarding his eliquis. He advised he did pick up a 30-day supply of this at the pharmacy today as he would be out over the weekend.   I advised I will pull him samples of the medication as soon as we receive these in the office.  He is also aware we will pull an application for patient assistance for him as well.  The patient and his wife voice understanding.  They then asked about his entresto stating he will be out tomorrow.  They state this is needing/ is in the process for a prior authorization.  I advised I cannot see where the PA has been done and will need to follow up on this.  He is aware I have 1 bottle of samples of Entresto 24/24 mg in the office. He will come pick this up in the office today. I advised I will wait for him to come.   The patient/ his wife are on the way now.   Samples given:  Entresto 24/26 mg Lot: [id number] Exp: 10/22 # 1 bottle  Eliquis patient assistance application also provided.

## 2019-12-20 NOTE — Addendum Note (Signed)
Addended by: Elmo Putt on: 12/20/2019 10:09 AM   Modules accepted: Orders

## 2019-12-24 ENCOUNTER — Telehealth: Payer: Self-pay | Admitting: *Deleted

## 2019-12-24 NOTE — Telephone Encounter (Signed)
Completed phone call and chart review for T18,M48 for the CLEAR research study since subject is followed by phone only. All concomitant medication changes have been reviewed and updated in [facility]. All SAE's and AE's have been reported to sponsor and IRB and updated in [facility] when research coordinator was made aware in September. No new findings today. Next phone call and chart review will be at the end of January 2022.

## 2019-12-25 NOTE — Telephone Encounter (Signed)
I spoke with the patient and advised him that I did get some eliquis samples in the office that I can give to him. He states his wife is seeing Dr. Saunders Revel tomorrow and asked if she could pick these up. I advised this will be fine and that she can just ask for these tomorrow at the front desk.   Per the patient, Mrs. Holtzer will also be bringing back his patient assistance paperwork tomorrow as well.  He was very appreciative for the call back.  Samples Given: Eliquis 5 mg Lot: [id number] Exp: Mar 2023 # 3 boxes

## 2019-12-26 ENCOUNTER — Other Ambulatory Visit: Payer: Self-pay | Admitting: *Deleted

## 2019-12-26 MED ORDER — APIXABAN 5 MG PO TABS
5.0000 mg | ORAL_TABLET | Freq: Two times a day (BID) | ORAL | 3 refills | Status: DC
Start: 2019-12-26 — End: 2020-03-23

## 2019-12-26 NOTE — Telephone Encounter (Signed)
Patent spouse dropped off patient assistance forms  Placed in nurse box

## 2019-12-26 NOTE — Telephone Encounter (Signed)
Eliquis patient assistance forms obtained from nurse bin and completed except for physician signature on the application/ RX.  Paper work was given to [employer], Therapist, sports for Dr. Saunders Revel to have him sign the application & RX.

## 2019-12-27 NOTE — Telephone Encounter (Signed)
Application completed and signed by Dr End and faxed to [employer] this morning.

## 2019-12-31 ENCOUNTER — Telehealth: Payer: Self-pay

## 2019-12-31 NOTE — Telephone Encounter (Signed)
Patient walked into the office to qst samples of Entresto.  Medication Samples have been provided to the patient.  Drug name: [name]   Strength: 24/26 mg        Qty: 1 bottle     Dosing instructions: Take 1 tablet twice daily.

## 2020-01-01 NOTE — Telephone Encounter (Signed)
Forms signed and completed by Dr End. Faxed successfully to [employer]. MyChart message sent to patient with the update.

## 2020-01-03 ENCOUNTER — Other Ambulatory Visit: Payer: Self-pay | Admitting: Cardiothoracic Surgery

## 2020-01-07 ENCOUNTER — Ambulatory Visit (HOSPITAL_COMMUNITY)
Admission: RE | Admit: 2020-01-07 | Discharge: 2020-01-07 | Disposition: A | Discharge status: Home / Self Care | Payer: PPO | Source: Ambulatory Visit | Attending: Cardiology | Admitting: Cardiology

## 2020-01-07 ENCOUNTER — Other Ambulatory Visit: Payer: Self-pay

## 2020-01-07 ENCOUNTER — Telehealth (HOSPITAL_COMMUNITY): Payer: Self-pay | Admitting: Pharmacy Technician

## 2020-01-07 ENCOUNTER — Telehealth: Payer: Self-pay | Admitting: Internal Medicine

## 2020-01-07 ENCOUNTER — Encounter (HOSPITAL_COMMUNITY): Payer: Self-pay | Admitting: Cardiology

## 2020-01-07 VITALS — BP 124/80 | HR 77 | Wt 172.8 lb

## 2020-01-07 DIAGNOSIS — Z951 Presence of aortocoronary bypass graft: Secondary | ICD-10-CM | POA: Insufficient documentation

## 2020-01-07 DIAGNOSIS — I252 Old myocardial infarction: Secondary | ICD-10-CM | POA: Diagnosis not present

## 2020-01-07 DIAGNOSIS — I480 Paroxysmal atrial fibrillation: Secondary | ICD-10-CM | POA: Insufficient documentation

## 2020-01-07 DIAGNOSIS — Z955 Presence of coronary angioplasty implant and graft: Secondary | ICD-10-CM | POA: Diagnosis not present

## 2020-01-07 DIAGNOSIS — Z7901 Long term (current) use of anticoagulants: Secondary | ICD-10-CM | POA: Diagnosis not present

## 2020-01-07 DIAGNOSIS — E785 Hyperlipidemia, unspecified: Secondary | ICD-10-CM | POA: Insufficient documentation

## 2020-01-07 DIAGNOSIS — I110 Hypertensive heart disease with heart failure: Secondary | ICD-10-CM | POA: Diagnosis not present

## 2020-01-07 DIAGNOSIS — I5022 Chronic systolic (congestive) heart failure: Secondary | ICD-10-CM | POA: Diagnosis not present

## 2020-01-07 DIAGNOSIS — I447 Left bundle-branch block, unspecified: Secondary | ICD-10-CM | POA: Insufficient documentation

## 2020-01-07 DIAGNOSIS — Z79899 Other long term (current) drug therapy: Secondary | ICD-10-CM | POA: Insufficient documentation

## 2020-01-07 DIAGNOSIS — I2510 Atherosclerotic heart disease of native coronary artery without angina pectoris: Secondary | ICD-10-CM | POA: Diagnosis not present

## 2020-01-07 DIAGNOSIS — Z87891 Personal history of nicotine dependence: Secondary | ICD-10-CM | POA: Diagnosis not present

## 2020-01-07 HISTORY — DX: Heart failure, unspecified: I50.9

## 2020-01-07 LAB — LIPID PANEL
Cholesterol: 144 mg/dL (ref 0–200)
HDL: 62 mg/dL (ref >40)
LDL Cholesterol: 50 mg/dL (ref 0–99)
Total CHOL/HDL Ratio: 2.3 RATIO
Triglycerides: 158 mg/dL — ABNORMAL HIGH (ref <150)
VLDL: 32 mg/dL (ref 0–40)

## 2020-01-07 LAB — BASIC METABOLIC PANEL
Anion gap: 10 (ref 5–15)
BUN: 21 mg/dL (ref 8–23)
CO2: 24 mmol/L (ref 22–32)
Calcium: 9.5 mg/dL (ref 8.9–10.3)
Chloride: 105 mmol/L (ref 98–111)
Creatinine, Ser: 0.89 mg/dL (ref 0.61–1.24)
GFR, Estimated: >60 mL/min (ref >60)
Glucose, Bld: 100 mg/dL — ABNORMAL HIGH (ref 70–99)
Potassium: 4.2 mmol/L (ref 3.5–5.1)
Sodium: 139 mmol/L (ref 135–145)

## 2020-01-07 MED ORDER — CARVEDILOL 6.25 MG PO TABS: 6.2500 mg | ORAL_TABLET | Freq: Two times a day (BID) | ORAL | 3 refills | Status: DC

## 2020-01-07 NOTE — Telephone Encounter (Signed)
Advanced Heart Failure Patient Advocate Encounter  Prior Authorization for [name] has been approved.    PA#  [id number] Effective dates: 01/07/20 through 01/06/21  Patients co-pay is $142  Will proceed with both [employer] and [employer] applications.

## 2020-01-07 NOTE — Patient Instructions (Signed)
Start Carvedilol 6.25 mg Twice daily   Labs done today, your results will be available in MyChart, we will contact you for abnormal readings.  Please follow up with our heart failure pharmacist in 3-4 weeks  Keep follow up as scheduled on 03/02/20  If you have any questions or concerns before your next appointment please send [place] a message through [place] or call our office at [phone].    TO LEAVE A MESSAGE FOR THE NURSE SELECT OPTION 2, PLEASE LEAVE A MESSAGE INCLUDING:  YOUR NAME  DATE OF BIRTH  CALL BACK NUMBER  REASON FOR CALL**this is important as we prioritize the call backs  YOU WILL RECEIVE A CALL BACK THE SAME DAY AS [place] AS YOU CALL BEFORE 4:00 PM  At the [facility], you and your health needs are our priority. As part of our continuing mission to provide you with exceptional heart care, we have created designated Provider Care Teams. These Care Teams include your primary Cardiologist (physician) and Advanced Practice Providers (APPs- Physician Assistants and Nurse Practitioners) who all work together to provide you with the care you need, when you need it.   You may see any of the following providers on your designated Care Team at your next follow up:  Dr Glori Bickers  Dr Haynes Kerns, NP  Lyda Jester, [place]  Audry Riles, PharmD   Please be sure to bring in all your medications bottles to every appointment.

## 2020-01-07 NOTE — Telephone Encounter (Signed)
Per [employer] patient pharmacy [facility] requires Prior Auth for [employer].   Please call [phone] to initiate or fax the existing Prior auth to [phone].

## 2020-01-07 NOTE — Telephone Encounter (Signed)
Patient is in the coverage gap and is having a hard time affording Eliquis. [name] is requiring a PA but it will also have a high co-pay  Started an application for both [employer] and [employer].  Will fax in once signatures are obtained and [name] PA is approved.

## 2020-01-07 NOTE — Progress Notes (Signed)
PCP: Dr Army Melia  HF Cardiology: Dr [name]  Cardiology: Dr End  CT Surgeon: Dr Prescott Gum  HPI: Mr Coats is a 69 y.o. with a history of CAD with NSTEMI 2017 s/p PCI to LCX , LBBB, HTN, PAD, RCEA, left femoral stent (Lauman term plavix), and hyperlipidemia.   Had ECHO 2019 that showed EF 40-45%.    Presented to [facility] in 9/21 for outpatient heart cath. Cath showed left main disease. ECHO completed and showed EF 25-30% with normal RV. Transferred to [facility] for CT surgery consultation. Deemed appropriate for CABG. S/P CABG LIMA-LAD, SVG-OM1, SVG-OM2 and SVG-PDA. Post-op, he required Impella 5.5. Had atrial fibrillation. Placed IV amio and transitioned to po amio. Diuresed with IV lasix and transitioned to 40 mg po lasix daily.   Today he returns for followup of CHF and CAD.  He is in NSR, no longer taking amiodarone. He is off digoxin with elevated level and nausea.  No chest pain.  No significant exertional dyspnea.  He has not wanted to do cardiac rehab.  He is walking 1/4 mile 2-3 times/day, limited by hip pain from walking further.  He is doing some putting and chipping at the golf course.  No palpitations.  No orthopnea/PND.  He has not had to use any Lasix. Weight up 4 lbs.   ECG (personally reviewed): NSR, PVC, LBBB 162 msec  Labs (10/21): K 4.5, creatinine 0.98  PMH:  1. HTN 2. GERD with esophageal stenosis and dilation.  3. Hyperlipidemia.  4. CAD: NSTEMI in 2017 with PCI to LCx.  - LHC (9/21) with severe 3 vessel disease.   - CABG (9/21) with LIMA-LAD, SVG-OM1, SVG-OM2, SVG-PDA.  5. Atrial fibrillation: Paroxysmal.  6. Carotid stenosis: Right CEA.  - Carotid dopplers (3/79): 02-40% LICA 7. PAD: Left SFA stent.  8. H/o LBBB 9. Chronic systolic CHF: Ischemic cardiomyopathy.  - Echo (2019): EF 40-45%.  - Echo (9/21): EF 25-30%, normal RV.   ROS: All systems negative except as listed in HPI, PMH and Problem List.  Social History   Socioeconomic History  . Marital status: Married     Spouse name: Not on file  . Number of children: 1  . Years of education: Not on file  . Highest education level: Bachelor's degree (e.g., BA, AB, BS)  Occupational History  . Not on file  Tobacco Use  . Smoking status: Former Smoker    Packs/day: 1.25    Years: 35.00    Pack years: 43.75    Types: Cigarettes    Quit date: 02/28/2005    Years since quitting: 14.8  . Smokeless tobacco: Current User    Types: Snuff  . Tobacco comment: occaisionally  Vaping Use  . Vaping Use: Never used  Substance and Sexual Activity  . Alcohol use: Yes    Alcohol/week: 8.0 - 10.0 standard drinks    Types: 8 - 10 Glasses of wine per week  . Drug use: No  . Sexual activity: Yes  Other Topics Concern  . Not on file  Social History Narrative  . Not on file   Social Determinants of Health   Financial Resource Strain: Low Risk   . Difficulty of Paying Living Expenses: Not hard at all  Food Insecurity: No Food Insecurity  . Worried About Charity fundraiser in the Last Year: Never true  . Ran Out of Food in the Last Year: Never true  Transportation Needs: No Transportation Needs  . Lack of Transportation (Medical): No  .  Lack of Transportation (Non-Medical): No  Physical Activity: Sufficiently Active  . Days of Exercise per Week: 4 days  . Minutes of Exercise per Session: 60 min  Stress: No Stress Concern Present  . Feeling of Stress : Not at all  Social Connections: Moderately Integrated  . Frequency of Communication with Friends and Family: More than three times a week  . Frequency of Social Gatherings with Friends and Family: Three times a week  . Attends Religious Services: Never  . Active Member of Clubs or Organizations: Yes  . Attends Archivist Meetings: More than 4 times per year  . Marital Status: Married  Human resources officer Violence: Not At Risk  . Fear of Current or Ex-Partner: No  . Emotionally Abused: No  . Physically Abused: No  . Sexually Abused: No    Family  History  Problem Relation Age of Onset  . Heart attack Mother   . Coronary artery disease Mother   . Heart disease Mother        Carotid Stenosis and BPG and Heart Disease before age 27  . Diabetes Mother   . Hypertension Mother   . Heart attack Father   . Heart disease Father        BPG and Heart Disease before age 53  . Hypertension Father   . Cancer Father 55       throat  . Stroke Father   . Colon cancer Neg Hx   . Colon polyps Neg Hx   . Esophageal cancer Neg Hx   . Rectal cancer Neg Hx   . Stomach cancer Neg Hx      Current Outpatient Medications  Medication Sig Dispense Refill  . Alirocumab (PRALUENT) 150 MG/ML SOAJ Inject 1 pen into the skin every 14 (fourteen) days. 2 mL 11  . apixaban (ELIQUIS) 5 MG TABS tablet Take 1 tablet (5 mg total) by mouth 2 (two) times daily. 180 tablet 3  . colchicine 0.6 MG tablet Take 1 tablet (0.6 mg total) by mouth 2 (two) times daily. 30 tablet 1  . furosemide (LASIX) 20 MG tablet Take 1 tablet (20 mg total) by mouth daily as needed (for weight 168 lb or greater).    . nitroGLYCERIN (NITROSTAT) 0.4 MG SL tablet Place 1 tablet (0.4 mg total) under the tongue every 5 (five) minutes as needed for chest pain. 25 tablet 2  . pantoprazole (PROTONIX) 40 MG tablet Take 1 tablet (40 mg total) by mouth daily. 90 tablet 1  . potassium chloride SA (KLOR-CON) 20 MEQ tablet Take 1 tablet (20 mEq total) by mouth daily as needed (on days your take your furosemide.). 90 tablet 3  . sacubitril-valsartan (ENTRESTO) 24-26 MG Take 1 tablet by mouth 2 (two) times daily. 180 tablet 1  . spironolactone (ALDACTONE) 25 MG tablet Take 1 tablet (25 mg total) by mouth daily. 90 tablet 1  . traMADol (ULTRAM) 50 MG tablet Take 1 tablet (50 mg total) by mouth every 6 (six) hours as needed. 40 tablet 0  . traZODone (DESYREL) 50 MG tablet TAKE 1 TABLET (50 MG TOTAL) BY MOUTH AT BEDTIME AS NEEDED FOR SLEEP. 30 tablet 0  . triamcinolone cream (KENALOG) 0.1 % Apply 1  application topically 2 (two) times daily as needed for itching.    . carvedilol (COREG) 6.25 MG tablet Take 1 tablet (6.25 mg total) by mouth 2 (two) times daily. 180 tablet 3   No current facility-administered medications for this encounter.    Vitals:  01/07/20 1523  BP: 124/80  Pulse: 77  SpO2: 96%  Weight: 78.4 kg (172 lb 12.8 oz)   Wt Readings from Last 3 Encounters:  01/07/20 78.4 kg (172 lb 12.8 oz)  12/18/19 76.7 kg (169 lb)  12/05/19 76.3 kg (168 lb 2 oz)    PHYSICAL EXAM: General: NAD Neck: No JVD, no thyromegaly or thyroid nodule.  Lungs: Clear to auscultation bilaterally with normal respiratory effort. CV: Nondisplaced PMI.  Heart regular S1/S2, no S3/S4, no murmur.  No peripheral edema.  No carotid bruit.  Normal pedal pulses.  Abdomen: Soft, nontender, no hepatosplenomegaly, no distention.  Skin: Intact without lesions or rashes.  Neurologic: Alert and oriented x 3.  Psych: Normal affect. Extremities: No clubbing or cyanosis.  HEENT: Normal.   ASSESSMENT & PLAN: 1. CAD: H/o PCI to LCX in 2017. LHC 9/7/21with multivessel CAD, including calcified 80% distal LM stenosis, small diffusely diseased distal LAD, chronically occluded OM1, and 50% mid RCA lesion.  He is now s/p LIMA-LAD, SVG-OM1, SVG-OM2 and SVG-PDA. No chest pain.  - Intolerant of statins, he is on Praluent now.  Check lipids today.  - No ASA given apixaban use.  2. Chronic systolic CHF: Ischemic cardiomyopathy.  Echo in 9/21 with EF 25-30%.  NYHA II. He is not volume overloaded.  - Add Coreg 6.25 mg bid today.  - Continue Entresto 24-26 mg bid. BMET today.  - Continue spironolactone 25 mg daily  - SGLT2-inhibitor at next appt.  - If EF remains low on followup echo in 1/22, will be CRT-D candidate with LBBB.  3. Atrial fibrillation: Paroxysmal.  Noted peri-op.  He is now off amiodarone. NSR today.  - Continue apixaban.   Followup in 3 wks with HF pharmacist for med titration, see me in 1/22 with  echo.   Loralie Champagne 01/07/2020

## 2020-01-07 NOTE — Telephone Encounter (Signed)
Patient Advocate Encounter   Received notification from HTA that prior authorization for [name] is required   PA submitted on CoverMyMeds Key  [facility] Status is pending   Will continue to follow.

## 2020-01-07 NOTE — Progress Notes (Signed)
Medication Samples have been provided to the patient.  Drug name: Eliquis       Strength: 5mg         Qty: 2  LOT: [id number]  Exp.Date: 8/23  Dosing instructions: Take 1 tablet by mouth daily  The patient has been instructed regarding the correct time, dose, and frequency of taking this medication, including desired effects and most common side effects.   [name] 4:42 PM 01/07/2020  Medication Samples have been provided to the patient.  Drug name: [name]       Strength: 24/26        Qty: 2  LOT: [id number]  Exp.Date: 3/23  Dosing instructions: Take 1 tablet by mouth  2x a day  The patient has been instructed regarding the correct time, dose, and frequency of taking this medication, including desired effects and most common side effects.   [name] 4:45 PM 01/07/2020

## 2020-01-08 ENCOUNTER — Encounter: Payer: Self-pay | Admitting: Internal Medicine

## 2020-01-08 DIAGNOSIS — D6869 Other thrombophilia: Secondary | ICD-10-CM | POA: Insufficient documentation

## 2020-01-08 NOTE — Telephone Encounter (Signed)
Charlann Boxer, CPhT     01/07/20 4:20 PM Note Advanced Heart Failure Patient Advocate Encounter  Prior Authorization for [name] has been approved.    PA#  [id number] Effective dates: 01/07/20 through 01/06/21  Patients co-pay is $142  Will proceed with both [employer] and [employer] applications.      Charlann Boxer, [place]     01/07/20 4:19 PM Note Patient Advocate Encounter  Received notification from HTA that prior authorization for [name] is required  PA submitted on CoverMyMeds Key  [facility] Status is pending  Will continue to follow.     Charlann Boxer, [place]     01/07/20 4:12 PM Note Patient is in the coverage gap and is having a hard time affording Eliquis. [name] is requiring a PA but it will also have a high co-pay  Started an application for both [employer] and [employer].  Will fax in once signatures are obtained and [name] PA is approved.

## 2020-01-09 NOTE — Telephone Encounter (Addendum)
Sent in both applications via fax Wednesday 11/10. Attached PA approval to [employer] application.   Will follow up.

## 2020-01-13 NOTE — Telephone Encounter (Signed)
Called [employer] to check the status of the patient's application. Representative stated that they needed a PA approval or denial to proceed. I was able to confirm that they did receive that with his application. The representative sent the application back to processing.   Will follow up in a few days.

## 2020-01-13 NOTE — Telephone Encounter (Signed)
Called [employer] to check the status of the patient's application. The electronic checker came back that the patient was over the income. Will call patient and update him. Would need to send in POI in order to get a second determination.

## 2020-01-14 LAB — ECHOCARDIOGRAM LIMITED
Height: 66 in
Weight: 3104.08 oz

## 2020-01-14 NOTE — Telephone Encounter (Signed)
Called and spoke with the patient. He is going to resend the email with the information again.  Will follow up.

## 2020-01-15 ENCOUNTER — Ambulatory Visit: Payer: PPO | Admitting: Family

## 2020-01-15 ENCOUNTER — Encounter: Payer: Self-pay | Admitting: Family

## 2020-01-15 ENCOUNTER — Other Ambulatory Visit: Payer: Self-pay

## 2020-01-15 VITALS — BP 112/66 | HR 65 | Ht 66.0 in | Wt 167.0 lb

## 2020-01-15 DIAGNOSIS — I25118 Atherosclerotic heart disease of native coronary artery with other forms of angina pectoris: Secondary | ICD-10-CM | POA: Diagnosis not present

## 2020-01-15 DIAGNOSIS — I480 Paroxysmal atrial fibrillation: Secondary | ICD-10-CM | POA: Diagnosis not present

## 2020-01-15 DIAGNOSIS — I447 Left bundle-branch block, unspecified: Secondary | ICD-10-CM | POA: Diagnosis not present

## 2020-01-15 DIAGNOSIS — I5022 Chronic systolic (congestive) heart failure: Secondary | ICD-10-CM | POA: Diagnosis not present

## 2020-01-15 DIAGNOSIS — I255 Ischemic cardiomyopathy: Secondary | ICD-10-CM

## 2020-01-15 DIAGNOSIS — E785 Hyperlipidemia, unspecified: Secondary | ICD-10-CM

## 2020-01-15 NOTE — Patient Instructions (Signed)
Medication Instructions:  Your physician recommends that you continue on your current medications as directed. Please refer to the Current Medication list given to you today.  *If you need a refill on your cardiac medications before your next appointment, please call your pharmacy*   Lab Work: None ordered.   Testing/Procedures: None ordered.   Follow-Up: At [facility], you and your health needs are our priority.  As part of our continuing mission to provide you with exceptional heart care, we have created designated Provider Care Teams.  These Care Teams include your primary Cardiologist (physician) and Advanced Practice Providers (APPs -  Physician Assistants and Nurse Practitioners) who all work together to provide you with the care you need, when you need it.  We recommend signing up for the patient portal called "MyChart".  Sign up information is provided on this After Visit Summary.  MyChart is used to connect with patients for Virtual Visits (Telemedicine).  Patients are able to view lab/test results, encounter notes, upcoming appointments, etc.  Non-urgent messages can be sent to your provider as well.   To learn more about what you can do with MyChart, go to [email or web address].    Your next appointment:    Follow up in March or April 2022  The format for your next appointment:   In Person  Provider:   Nelva Bush, MD

## 2020-01-15 NOTE — Progress Notes (Signed)
Office Visit    Patient Name: [name] Date of Encounter: 01/15/2020  Primary Care Provider:  Glean Hess, MD Primary Cardiologist:  Nelva Bush, MD Electrophysiologist:  None   Chief Complaint    [name] is a 69 y.o. male with a hx of CAD s/p remote PCI to LCx in setting of NSTEMI and s/p CABGx4 10/2019, ICM/combined systolic and diastolic heart failure, PAD (right carotid endarterectomy, L SFA stent), HTN, HLD, prior tobacco use   presents today for follow-up of CAD and heart failure.  Past Medical History    Past Medical History:  Diagnosis Date  . Abnormal nuclear cardiac imaging test 08/08/2015  . Arthritis    fingers  . Carotid artery occlusion   . CHF (congestive heart failure) ([place])   . Coronary atherosclerosis of native coronary artery 01/29/2013   11/05/19 R/LHC 80% dLMCA stenosis small diffusely dz dLAD, chronically occluded OM1 50%mRCA lesion, widely patent mLCx strent, moderately elevated L heart filling pressures, mild to moderate RH filling pressures, normal to moderately reduced CO  . Duodenal erosion   . Encounter for screening for lung cancer 07/13/2016  . Esophageal stenosis    esophageal dilation  . GERD (gastroesophageal reflux disease)   . H. pylori infection   . Heart attack [facility]) Oct. 2009   Mild  . Hiatal hernia   . Hyperlipidemia   . Hypertension   . 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 Cardiac catheterization-nonobstructive 60% RCA disease-subsequent nuclear stress test-9 minutes, low risk, mild inferior wall hypokinesis   . Pain in limb 12/19/2017  . Peripheral vascular disease ([place])   . Unstable angina ([place]) 11/25/2017   Past Surgical History:  Procedure Laterality Date  . APPENDECTOMY    . BACK SURGERY    . CARDIAC CATHETERIZATION N/A 08/07/2015   Procedure: Left Heart Cath and Coronary Angiography;  Surgeon: [name], MD;  Location: [place] [facility];  Service:  Cardiovascular;  Laterality: N/A;  . CARDIAC CATHETERIZATION N/A 08/07/2015   Procedure: Coronary Stent Intervention;  Surgeon: [name], MD;  Location: [place] [facility];  Service: Cardiovascular;  Laterality: N/A;  . CARDIAC CATHETERIZATION N/A 08/07/2015   Procedure: Coronary Stent Intervention;  Surgeon: [name], MD;  Location: [place] [facility];  Service: Cardiovascular;  Laterality: N/A;  . CAROTID ENDARTERECTOMY  01/05/2006   Right  CEA with DPA  . CATARACT EXTRACTION W/ INTRAOCULAR LENS IMPLANT Left 12/04/2017  . CATARACT EXTRACTION W/PHACO Left 12/04/2017   Procedure: CATARACT EXTRACTION PHACO AND INTRAOCULAR LENS PLACEMENT ([place]) LEFT;  Surgeon: Eulogio Bear, MD;  Location: [place];  Service: Ophthalmology;  Laterality: Left;  . CATARACT EXTRACTION W/PHACO Right 02/06/2018   Procedure: CATARACT EXTRACTION PHACO AND INTRAOCULAR LENS PLACEMENT (IOC)RIGHT;  Surgeon: Eulogio Bear, MD;  Location: [place];  Service: Ophthalmology;  Laterality: Right;  . COLONOSCOPY  05/20/2008  . COLONOSCOPY WITH PROPOFOL N/A 09/13/2018   Procedure: COLONOSCOPY WITH BIOPSY;  Surgeon: [name], MD;  Location: [place];  Service: Endoscopy;  Laterality: N/A;  . CORONARY ARTERY BYPASS GRAFT N/A 11/11/2019   Procedure: CORONARY ARTERY BYPASS GRAFTING (CABG) USING LIMA to Diag1; ENDOSCOPICALLY HARVESTED RIGHT GREATER SAPHENOUS VEIN: SVG to OM1; SVG to OM2; SVG to PDA.;  Surgeon: Ivin Poot, MD;  Location: [place];  Service: Open Heart Surgery;  Laterality: N/A;  . CORONARY STENT PLACEMENT  08/07/2015   MID CIRCUMFLEX  .  ENDOVEIN HARVEST OF GREATER SAPHENOUS VEIN Right 11/11/2019   Procedure: ENDOVEIN HARVEST OF GREATER SAPHENOUS VEIN;  Surgeon: Ivin Poot, MD;  Location: [place];  Service: Open Heart Surgery;  Laterality: Right;  . ESOPHAGOGASTRODUODENOSCOPY (EGD) WITH PROPOFOL N/A 02/11/2019   Procedure: ESOPHAGOGASTRODUODENOSCOPY (EGD) WITH BIOPSY  and  Dilation;  Surgeon: [name], MD;  Location: [place];  Service: Endoscopy;  Laterality: N/A;  . HIP SURGERY Left 10/2016   left hip tendon repair  . LEFT HEART CATH AND CORONARY ANGIOGRAPHY N/A 11/27/2017   Procedure: LEFT HEART CATH AND CORONARY ANGIOGRAPHY;  Surgeon: Wellington Hampshire, MD;  Location: [place] [facility];  Service: Cardiovascular;  Laterality: N/A;  . LOWER EXTREMITY ANGIOGRAPHY Left 02/12/2018   Procedure: LOWER EXTREMITY ANGIOGRAPHY;  Surgeon: Algernon Huxley, MD;  Location: [place] [facility];  Service: Cardiovascular;  Laterality: Left;  . LOWER EXTREMITY ANGIOGRAPHY Left 03/07/2018   Procedure: LOWER EXTREMITY ANGIOGRAPHY;  Surgeon: Algernon Huxley, MD;  Location: [place] [facility];  Service: Cardiovascular;  Laterality: Left;  . LOWER EXTREMITY ANGIOGRAPHY Left 06/04/2018   Procedure: LOWER EXTREMITY ANGIOGRAPHY;  Surgeon: Algernon Huxley, MD;  Location: [place] [facility];  Service: Cardiovascular;  Laterality: Left;  . PLACEMENT OF IMPELLA LEFT VENTRICULAR ASSIST DEVICE N/A 11/11/2019   Procedure: PLACEMENT OF IMPELLA LEFT VENTRICULAR ASSIST DEVICE 5.5;  Surgeon: Ivin Poot, MD;  Location: [place];  Service: Open Heart Surgery;  Laterality: N/A;  Midline Sternotomy  . POLYPECTOMY N/A 09/13/2018   Procedure: POLYPECTOMY;  Surgeon: [name], MD;  Location: [place];  Service: Endoscopy;  Laterality: N/A;  . POLYPECTOMY N/A 02/11/2019   Procedure: POLYPECTOMY;  Surgeon: [name], MD;  Location: [place];  Service: Endoscopy;  Laterality: N/A;  . REMOVAL OF IMPELLA LEFT VENTRICULAR ASSIST DEVICE N/A 11/15/2019   Procedure: REMOVAL OF IMPELLA 5.5 LEFT VENTRICULAR ASSIST DEVICE;  Surgeon: Ivin Poot, MD;  Location: [place];  Service: Open Heart Surgery;  Laterality: N/A;  . RIGHT/LEFT HEART CATH AND CORONARY ANGIOGRAPHY N/A 11/05/2019   Procedure: RIGHT/LEFT HEART CATH AND CORONARY ANGIOGRAPHY;  Surgeon: Nelva Bush, MD;   Location: [place] [facility];  Service: Cardiovascular;  Laterality: N/A;  . SPINE SURGERY    . TEE WITHOUT CARDIOVERSION N/A 11/11/2019   Procedure: TRANSESOPHAGEAL ECHOCARDIOGRAM (TEE);  Surgeon: Prescott Gum, Collier Salina, MD;  Location: [place];  Service: Open Heart Surgery;  Laterality: N/A;  . TEE WITHOUT CARDIOVERSION N/A 11/15/2019   Procedure: TRANSESOPHAGEAL ECHOCARDIOGRAM (TEE);  Surgeon: Prescott Gum, Collier Salina, MD;  Location: [place];  Service: Open Heart Surgery;  Laterality: N/A;  . TONSILLECTOMY      Allergies  Allergies  Allergen Reactions  . Brilinta [Ticagrelor] Shortness Of Breath  . Chlorhexidine Gluconate Other (See Comments)    Skin burning for hours afterward  . Statins Other (See Comments)    Failed Crestor 5 mg twice weekly, Crestor 20 mg daily, Pravastatin 40 mg qd, Lipitor, Zocor - muscle aches  . Zetia [Ezetimibe] Other (See Comments)    Muscle aches    History of Present Illness    [name] is a 69 y.o. male with a hx of CAD s/p remote PCI to LCx in setting of NSTEMI and s/p CABGx4 10/2019, ICM/combined systolic and diastolic heart failure, PAD (right carotid endarterectomy, L SFA stent), HTN, HLD, prior tobacco use. He was last seen by Dr. [name] in the [facility] 01/07/20.  Previous cardiac work-up includes LHC  10/2017 with widely patent LCx stent and otherwise mild to moderate CAD.  Mild to moderately reduced LV SF with EF 40-45% and mildly elevated LVEDP 30 mmHg. Echo with EF 45-50%. Recommended for medication management.  Repeat limited echo 01/2018 EF 40-45%.  Underwent mechanical thrombectomy and catheter thrombolysis to L SFA 05/2018.    Seen in clinic 10/21/19 noting DOE. He had new TWI leads V1-V6. Seen in ED same day with TWI resolved with exception of lead V5-6. He did not stay to be evaluated. He was scheduled for [facility] 11/05/19 which showed multivessel CAD including calcified 80% distal LMCA stenosis, small diffusely diseased distal LAD, CTO OM1, 50%  mid RCA lesion. Mid LCx stent patent. He was transferred to [facility] for CABG. Echo 11/06/19 EF 25-30%, LV severely dilated, mild MR.   After transfer he was medically stabilized with assistance of heart failure team due to elevated wedge pressure and need for Plavix washout.  Underwent CABGx4 with placement of Impella 11/11/19.  He was followed closely by the advanced heart failure team.  He required platelets, FFP, PRBC.  He did develop atrial fib with RVR on POD2, amiodarone started, converted to NSR. Impella LVAD removed POD4. He was discharged on Amiodarone and Eliquis.    Seen by surgeon 11/27/19 for postop noted to be doing well.  He was given prescription for trazodone for assistance with sleeping.  Digoxin was previously discontinued due to elevated level and nausea.  Amiodarone has been discontinued since hospital discharge.  He has maintained NSR. Seen by Dr. [name] 01/07/20. Noted to be walking quarter mile 2-3 times per day. Coreg 6.25mg  BID was added to his regimen of Entresto 24-26mg  BID, Spironolactone 25mg  daily.  He was recommended to follow-up in January with Dr. [name] with echocardiogram at that time.  He presents today for follow-up.  Reports no chest pain, pressure, tightness.  Tells me he continues to walk regularly and is pleased by his progress.  He is hopeful to get back to golfing soon as he very much enjoys doing this.  Denies lower extremity edema, orthopnea, PND, palpitations.  He is tolerating Coreg without difficulty.  EKGs/Labs/Other Studies Reviewed:   The following studies were reviewed today: Echo 11/06/19  1. Poor quality images no parasternal/subcostal images.   2. Septal apical and inferior basal akinesis hypokinesis of mid and  apical inferior wall. Left ventricular ejection fraction, by estimation,  is 25 to 30%. The left ventricle has severely decreased function. The left  ventricle demonstrates regional wall  motion abnormalities (see scoring diagram/findings  for description). The  left ventricular internal cavity size was severely dilated. Left  ventricular diastolic parameters were normal.   3. Right ventricular systolic function is normal. The right ventricular  size is normal.   4. The mitral valve is normal in structure. Mild mitral valve  regurgitation. No evidence of mitral stenosis.   5. The aortic valve was not well visualized. Aortic valve regurgitation  is not visualized. Mild aortic valve sclerosis is present, with no  evidence of aortic valve stenosis.   6. The inferior vena cava is normal in size with greater than 50%  respiratory variability, suggesting right atrial pressure of 3 mmHg.    R/LHC 11/05/19 Conclusions: 1. Multivessel CAD, including calcified 80% distal LMCA stenosis, small diffusely diseased distal LAD, chronically occluded OM1, and 50% mid RCA lesion. 2. Widely patent mid LCx stent. 3. Moderately elevated left heart filling pressure. 4. Mildly to moderately elevated right heart filling pressure. 5.  Normal to mildly reduced cardiac output/index.   Recommendations: 1. Transfer to [facility] for cardiac surgery consultation for CABG, given severe LMCA disease and low LVEF. 2. Hold clopidogrel pending cardiac surgery consultation. 3. Obtain echocardiogram. 4. Aggressive secondary prevention.  EKG:  EKG is  ordered today.  The ekg ordered today demonstrates NSR 65 bpm with known left bundle branch block.  Recent Labs: 11/07/2019: TSH 3.303 11/16/2019: Magnesium 2.0 11/18/2019: ALT 16 12/04/2019: BNP 202.7; Hemoglobin 11.9; Platelets 326 01/07/2020: BUN 21; Creatinine, Ser 0.89; Potassium 4.2; Sodium 139  Recent Lipid Panel    Component Value Date/Time   CHOL 144 01/07/2020 1601   CHOL 144 12/10/2018 0925   TRIG 158 (H) 01/07/2020 1601   HDL 62 01/07/2020 1601   HDL 59 12/10/2018 0925   CHOLHDL 2.3 01/07/2020 1601   VLDL 32 01/07/2020 1601   LDLCALC 50 01/07/2020 1601   LDLCALC 68 12/10/2018 0925    Home  Medications   Current Meds  Medication Sig  . Alirocumab (PRALUENT) 150 MG/ML SOAJ Inject 1 pen into the skin every 14 (fourteen) days.  Marland Kitchen apixaban (ELIQUIS) 5 MG TABS tablet Take 1 tablet (5 mg total) by mouth 2 (two) times daily.  . carvedilol (COREG) 6.25 MG tablet Take 1 tablet (6.25 mg total) by mouth 2 (two) times daily.  . colchicine 0.6 MG tablet Take 1 tablet (0.6 mg total) by mouth 2 (two) times daily.  . furosemide (LASIX) 20 MG tablet Take 1 tablet (20 mg total) by mouth daily as needed (for weight 168 lb or greater).  . nitroGLYCERIN (NITROSTAT) 0.4 MG SL tablet Place 1 tablet (0.4 mg total) under the tongue every 5 (five) minutes as needed for chest pain.  . pantoprazole (PROTONIX) 40 MG tablet Take 1 tablet (40 mg total) by mouth daily.  . potassium chloride SA (KLOR-CON) 20 MEQ tablet Take 1 tablet (20 mEq total) by mouth daily as needed (on days your take your furosemide.).  Marland Kitchen sacubitril-valsartan (ENTRESTO) 24-26 MG Take 1 tablet by mouth 2 (two) times daily.  Marland Kitchen spironolactone (ALDACTONE) 25 MG tablet Take 1 tablet (25 mg total) by mouth daily.  . traMADol (ULTRAM) 50 MG tablet Take 1 tablet (50 mg total) by mouth every 6 (six) hours as needed.  . traZODone (DESYREL) 50 MG tablet TAKE 1 TABLET (50 MG TOTAL) BY MOUTH AT BEDTIME AS NEEDED FOR SLEEP.  Marland Kitchen triamcinolone cream (KENALOG) 0.1 % Apply 1 application topically 2 (two) times daily as needed for itching.     Review of Systems  All other systems reviewed and are otherwise negative except as noted above.  Physical Exam    VS:  There were no vitals taken for this visit. , BMI There is no height or weight on file to calculate BMI.  Wt Readings from Last 3 Encounters:  01/07/20 172 lb 12.8 oz (78.4 kg)  12/18/19 169 lb (76.7 kg)  12/05/19 168 lb 2 oz (76.3 kg)    GEN: Well nourished, well developed, in no acute distress. HEENT: normal. Neck: Supple, no JVD, carotid bruits, or masses. Cardiac: RRR, no murmurs, rubs,  or gallops. No clubbing, cyanosis, edema.  Radials/DP/PT 2+ and equal bilaterally.  Respiratory: Respirations regular and unlabored, clear to auscultation bilaterally. GI: Soft, nontender, nondistended. MS: No deformity or atrophy. Skin: Warm and dry, no rash. Neuro:  Strength and sensation are intact. Psych: Normal affect.  Assessment & Plan    1. CAD s/p CABG x4 11/11/19 -stable with no anginal symptoms.  EKG today no acute ST/T wave changes.  No indication for ischemic evaluation.  Continue GDMT including beta-blocker.  No statin secondary to intolerance.  No aspirin secondary to chronic anticoagulation.  2. LBBB - Stable compared to prior.   3. HLD, LDL goal <70 / Statin intolerance -continue Praluent.  4. Postoperative atrial fibrillation / anticoagulation -maintaining NSR by EKG today.  No longer on AAD.  Denies bleeding complications on Eliquis.  He is applied for patient assistance and we are able to provide him with samples today.   5. On amiodarone therapy - Amiodarone discontinued approx 12/16/19. No evidence of recurrent atrial fibrillation. Consider monitor at least 3 months from discontinuation to assess for recurrent atrial fib. If no recurrent atrial fib, could consider discontinuing anticoagulation as his atrial fib was in post op period.   6. HFrEF / ICM -euvolemic and well compensated on exam.  Echo 11/14/2019 LVEF less than 20%.  Has undergone CABG at that time been placed on optimize medical therapy and following with the advanced heart failure clinic Dr. [name].  He has plans for repeat echocardiogram in January.  Present GDMT includes Coreg 6.25 mg twice daily, Lasix 20 mg as needed, Entresto 24-26 mg twice daily, spironolactone 25 mg daily.  Remains less than 35% at upcoming echo in January with Dr. [name] plan for CRT-D due to LBBB.  Disposition: Follow up in January with Dr. [name] as scheduled. Follow up in 4-5 months with Dr. Saunders Revel or APP   Signed, Loel Dubonnet,  NP 01/15/2020, 8:43 AM [place]

## 2020-01-16 NOTE — Telephone Encounter (Signed)
Advanced Heart Failure Patient Advocate Encounter   Patient was approved to receive Entresto from [employer]  Patient ID: [id number] Effective dates: 01/14/20 through 02/28/20

## 2020-01-17 NOTE — Telephone Encounter (Signed)
Sent in OOP to [place] via fax.   Will follow up.

## 2020-01-20 NOTE — Progress Notes (Signed)
PCP: Dr Army Melia  HF Cardiology: Dr [name]  Cardiology: Dr End  CT Surgeon: Dr Prescott Gum  HPI:  Mr [name] is a 69 y.o. with a history of CAD with NSTEMI 2017 s/p PCI to LCX , LBBB, HTN,PAD, RCEA, left femoral stent ([place] term clopidogrel),and hyperlipidemia.  Had ECHO 2019 that showed EF 40-45%.   Presented to [facility] 9/21 for outpatient heart cath. Cath showed left main disease.ECHO completed and showed EF 25-30% with normal RV.Transferred to [facility] for CT surgery consultation.Deemed appropriate for CABG. S/P CABGLIMA-LAD, SVG-OM1, SVG-OM2 and SVG-PDA. Post-op, he required Impella 5.5. Had atrial fibrillation. Placed on IV amiodarone and transitioned to po amiodarone. Diuresed with IV furosemide and transitioned to furosemide 40 mg daily.   Recently returned to HF Clinic for followup of CHF and CAD on 01/07/20.  He was in NSR, no longer taking amiodarone. He was off digoxin with elevated level and nausea.  No chest pain.  No significant exertional dyspnea.  He had not wanted to do cardiac rehab.  He was walking 1/4 mile 2-3 times/day, limited by hip pain from walking further.  He was doing some putting and chipping at the golf course.  No palpitations.  No orthopnea/PND.  He had not had to use any furosemide. Weight was up 4 lbs.   Today he returns to HF clinic for pharmacist medication titration. At last visit with MD, carvedilol 6.25 mg BID was initiated. Overall he is feeling well today. Saw Dr. Prescott Gum yesterday who cleared him to play golf. No dizziness, lightheadedness, chest pain or palpitations. No SOB/DOE. Activity limited by hip pain but he is still very active. Weight has been stable at home. Has not needed any PRN furosemide. No LEE, PND or orthopnea. Taking all medications and tolerating all medications. Is worried about the amount of brand name medications he is taking because he doesn't want to go in the donut hole next year.     HF Medications: Carvedilol 6.25 mg  BID Entresto 24/26 mg BID Spironolactone 25 mg daily Furosemide 40 mg PRN Potassium chloride 20 mEq PRN  Has the patient been experiencing any side effects to the medications prescribed?  no  Does the patient have any problems obtaining medications due to transportation or finances?   Yes - has [employer] Medicare plan. [name] through [employer]. Getting BMS samples while assistance application is pending.   Understanding of regimen: good Understanding of indications: good Potential of compliance: good Patient understands to avoid NSAIDs. Patient understands to avoid decongestants.    Pertinent Lab Values (01/07/20): Marland Kitchen Serum creatinine 0.89, BUN 21, Potassium 4.2, Sodium 139   Vital Signs: . Weight: 173.2 lbs (last clinic weight: 172.8 lbs) . Blood pressure: 120/76  . Heart rate: 64   Assessment: 1. CAD: H/o PCI to LCX in 2017. LHC 9/7/21with multivessel CAD, including calcified 80% distal LM stenosis, small diffusely diseased distal LAD, chronically occluded OM1, and 50% mid RCA lesion. He is now s/p LIMA-LAD, SVG-OM1, SVG-OM2 and SVG-PDA. No chest pain.  - Intolerant of statins, he is on Praluent now. - No ASA given apixaban use.  2. Chronic systolic CHF: Ischemic cardiomyopathy. Echo in 9/21 with EF 25-30%.   - NYHA II. He is not volume overloaded.  - Continue furosemide 40 mg PRN - Continue carvedilol 6.25 mg BID - Increase Entresto to 49/51 mg BID - Continue spironolactone 25 mg daily  -Would be good candidate for SGLT2-inhibitor. However, he is worried about expense of medications and going into  the donut hole next year. After benefits check, noted Wilder Glade is non-formulary and Vania Rea is currently $134. Revisit initiation of Jardiance after January 1st.    - If EF remains low on followup echo in 1/22, will be CRT-D candidate with LBBB.  3. Atrial fibrillation: Paroxysmal.  Noted peri-op.  He is now off amiodarone. NSR today.  - Continue apixaban.     Plan: 1) Medication changes: Based on clinical presentation, vital signs and recent labs will increase Entresto to 49/51 mg BID. 2) Follow-up: 1 month with Dr. Rush Farmer, PharmD, BCPS, [facility], CPP Heart Failure Clinic Pharmacist [phone]

## 2020-01-21 ENCOUNTER — Encounter (INDEPENDENT_AMBULATORY_CARE_PROVIDER_SITE_OTHER): Payer: PPO

## 2020-01-21 ENCOUNTER — Ambulatory Visit (INDEPENDENT_AMBULATORY_CARE_PROVIDER_SITE_OTHER): Payer: PPO | Admitting: Vascular Surgery

## 2020-01-28 ENCOUNTER — Telehealth: Payer: Self-pay | Admitting: *Deleted

## 2020-01-28 NOTE — Telephone Encounter (Signed)
Contacted in attempt to schedule lung screening scan. Patient reports he is still recovering from recent CABG. Wants to consider scheduling scan in January.

## 2020-01-28 NOTE — Telephone Encounter (Signed)
Spoke with the patient, he has enough Eliquis to last until mid December, will provide 2 weeks of samples to get through the end of the year. Will apply for BMS next year, once he hits the donut hole.  He is concerned about going in the donut hole next year, considering he is on a few brand name drugs now. We will apply for [employer] assistance for next year instead of proceeding with getting a PAN grant. He stated that when [employer] called and set up mailing him the medication, that they representative told him he was approved through 2022. Will call to confirm eligibility end date.  Sample information LOT [id number] EXP 09/2021

## 2020-01-29 ENCOUNTER — Other Ambulatory Visit: Payer: Self-pay

## 2020-01-29 ENCOUNTER — Other Ambulatory Visit: Payer: Self-pay | Admitting: Cardiothoracic Surgery

## 2020-01-29 ENCOUNTER — Encounter: Payer: Self-pay | Admitting: Cardiothoracic Surgery

## 2020-01-29 ENCOUNTER — Ambulatory Visit (INDEPENDENT_AMBULATORY_CARE_PROVIDER_SITE_OTHER): Payer: Self-pay | Admitting: Cardiothoracic Surgery

## 2020-01-29 VITALS — BP 118/78 | HR 67 | Temp 97.6°F | Resp 20 | Ht 66.0 in | Wt 173.0 lb

## 2020-01-29 DIAGNOSIS — I2510 Atherosclerotic heart disease of native coronary artery without angina pectoris: Secondary | ICD-10-CM

## 2020-01-29 DIAGNOSIS — Z951 Presence of aortocoronary bypass graft: Secondary | ICD-10-CM

## 2020-01-29 DIAGNOSIS — Z09 Encounter for follow-up examination after completed treatment for conditions other than malignant neoplasm: Secondary | ICD-10-CM

## 2020-01-29 MED ORDER — DICLOFENAC SODIUM 1 % EX GEL
2.0000 g | Freq: Two times a day (BID) | CUTANEOUS | Status: DC
Start: 2020-01-29 — End: 2020-01-30

## 2020-01-29 NOTE — Progress Notes (Signed)
PCP is Glean Hess, MD Referring Provider is End, Harrell Gave, MD  Chief Complaint  Patient presents with  . Routine Post Op    2 month f/u HX of CABG    HPI: Returns for follow-up almost 3 months after multivessel CABG for ischemic cardiomyopathy with placement of a percutaneous Impella 5.5 LVAD.  He has been followed at the heart failure clinic because of his of his low EF.  On his current medications he denies any symptoms of angina or CHF.  He walks over a mile a day.  He is taking Eliquis for postoperative atrial fibrillation but is maintained sinus rhythm for several weeks.  He is ready to resume his normal activities including golf, fitness center workout, yard work.  No bleeding complications from Eliquis. He has an appointment to see Dr. [name] in the heart failure clinic in January.  Past Medical History:  Diagnosis Date  . Abnormal nuclear cardiac imaging test 08/08/2015  . Arthritis    fingers  . Carotid artery occlusion   . CHF (congestive heart failure) ([place])   . Coronary atherosclerosis of native coronary artery 01/29/2013   11/05/19 R/LHC 80% dLMCA stenosis small diffusely dz dLAD, chronically occluded OM1 50%mRCA lesion, widely patent mLCx strent, moderately elevated L heart filling pressures, mild to moderate RH filling pressures, normal to moderately reduced CO  . Duodenal erosion   . Encounter for screening for lung cancer 07/13/2016  . Esophageal stenosis    esophageal dilation  . GERD (gastroesophageal reflux disease)   . H. pylori infection   . Heart attack [facility]) Oct. 2009   Mild  . Hiatal hernia   . Hyperlipidemia   . Hypertension   . 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 Cardiac catheterization-nonobstructive 60% RCA disease-subsequent nuclear stress test-9 minutes, low risk, mild inferior wall hypokinesis   . Pain in limb 12/19/2017  . Peripheral vascular disease ([place])   . Unstable angina ([place])  11/25/2017    Past Surgical History:  Procedure Laterality Date  . APPENDECTOMY    . BACK SURGERY    . CARDIAC CATHETERIZATION N/A 08/07/2015   Procedure: Left Heart Cath and Coronary Angiography;  Surgeon: [name], MD;  Location: [place] [facility];  Service: Cardiovascular;  Laterality: N/A;  . CARDIAC CATHETERIZATION N/A 08/07/2015   Procedure: Coronary Stent Intervention;  Surgeon: [name], MD;  Location: [place] [facility];  Service: Cardiovascular;  Laterality: N/A;  . CARDIAC CATHETERIZATION N/A 08/07/2015   Procedure: Coronary Stent Intervention;  Surgeon: Lonita Debes M Martinique, MD;  Location: [place] [facility];  Service: Cardiovascular;  Laterality: N/A;  . CAROTID ENDARTERECTOMY  01/05/2006   Right  CEA with DPA  . CATARACT EXTRACTION W/ INTRAOCULAR LENS IMPLANT Left 12/04/2017  . CATARACT EXTRACTION W/PHACO Left 12/04/2017   Procedure: CATARACT EXTRACTION PHACO AND INTRAOCULAR LENS PLACEMENT ([place]) LEFT;  Surgeon: Eulogio Bear, MD;  Location: [place];  Service: Ophthalmology;  Laterality: Left;  . CATARACT EXTRACTION W/PHACO Right 02/06/2018   Procedure: CATARACT EXTRACTION PHACO AND INTRAOCULAR LENS PLACEMENT (IOC)RIGHT;  Surgeon: Eulogio Bear, MD;  Location: [place];  Service: Ophthalmology;  Laterality: Right;  . COLONOSCOPY  05/20/2008  . COLONOSCOPY WITH PROPOFOL N/A 09/13/2018   Procedure: COLONOSCOPY WITH BIOPSY;  Surgeon: [name], MD;  Location: [place];  Service: Endoscopy;  Laterality: N/A;  . CORONARY ARTERY BYPASS GRAFT N/A 11/11/2019   Procedure: CORONARY ARTERY BYPASS GRAFTING (CABG)  USING LIMA to Diag1; ENDOSCOPICALLY HARVESTED RIGHT GREATER SAPHENOUS VEIN: SVG to OM1; SVG to OM2; SVG to PDA.;  Surgeon: Ivin Poot, MD;  Location: [place];  Service: Open Heart Surgery;  Laterality: N/A;  . CORONARY STENT PLACEMENT  08/07/2015   MID CIRCUMFLEX  . ENDOVEIN HARVEST OF GREATER SAPHENOUS VEIN Right 11/11/2019    Procedure: ENDOVEIN HARVEST OF GREATER SAPHENOUS VEIN;  Surgeon: Ivin Poot, MD;  Location: [place];  Service: Open Heart Surgery;  Laterality: Right;  . ESOPHAGOGASTRODUODENOSCOPY (EGD) WITH PROPOFOL N/A 02/11/2019   Procedure: ESOPHAGOGASTRODUODENOSCOPY (EGD) WITH BIOPSY and  Dilation;  Surgeon: [name], MD;  Location: [place];  Service: Endoscopy;  Laterality: N/A;  . HIP SURGERY Left 10/2016   left hip tendon repair  . LEFT HEART CATH AND CORONARY ANGIOGRAPHY N/A 11/27/2017   Procedure: LEFT HEART CATH AND CORONARY ANGIOGRAPHY;  Surgeon: Wellington Hampshire, MD;  Location: [place] [facility];  Service: Cardiovascular;  Laterality: N/A;  . LOWER EXTREMITY ANGIOGRAPHY Left 02/12/2018   Procedure: LOWER EXTREMITY ANGIOGRAPHY;  Surgeon: Algernon Huxley, MD;  Location: [place] [facility];  Service: Cardiovascular;  Laterality: Left;  . LOWER EXTREMITY ANGIOGRAPHY Left 03/07/2018   Procedure: LOWER EXTREMITY ANGIOGRAPHY;  Surgeon: Algernon Huxley, MD;  Location: [place] [facility];  Service: Cardiovascular;  Laterality: Left;  . LOWER EXTREMITY ANGIOGRAPHY Left 06/04/2018   Procedure: LOWER EXTREMITY ANGIOGRAPHY;  Surgeon: Algernon Huxley, MD;  Location: [place] [facility];  Service: Cardiovascular;  Laterality: Left;  . PLACEMENT OF IMPELLA LEFT VENTRICULAR ASSIST DEVICE N/A 11/11/2019   Procedure: PLACEMENT OF IMPELLA LEFT VENTRICULAR ASSIST DEVICE 5.5;  Surgeon: Ivin Poot, MD;  Location: [place];  Service: Open Heart Surgery;  Laterality: N/A;  Midline Sternotomy  . POLYPECTOMY N/A 09/13/2018   Procedure: POLYPECTOMY;  Surgeon: [name], MD;  Location: [place];  Service: Endoscopy;  Laterality: N/A;  . POLYPECTOMY N/A 02/11/2019   Procedure: POLYPECTOMY;  Surgeon: [name], MD;  Location: [place];  Service: Endoscopy;  Laterality: N/A;  . REMOVAL OF IMPELLA LEFT VENTRICULAR ASSIST DEVICE N/A 11/15/2019   Procedure: REMOVAL OF IMPELLA 5.5 LEFT  VENTRICULAR ASSIST DEVICE;  Surgeon: Ivin Poot, MD;  Location: [place];  Service: Open Heart Surgery;  Laterality: N/A;  . RIGHT/LEFT HEART CATH AND CORONARY ANGIOGRAPHY N/A 11/05/2019   Procedure: RIGHT/LEFT HEART CATH AND CORONARY ANGIOGRAPHY;  Surgeon: Nelva Bush, MD;  Location: [place] [facility];  Service: Cardiovascular;  Laterality: N/A;  . SPINE SURGERY    . TEE WITHOUT CARDIOVERSION N/A 11/11/2019   Procedure: TRANSESOPHAGEAL ECHOCARDIOGRAM (TEE);  Surgeon: Prescott Gum, Collier Salina, MD;  Location: [place];  Service: Open Heart Surgery;  Laterality: N/A;  . TEE WITHOUT CARDIOVERSION N/A 11/15/2019   Procedure: TRANSESOPHAGEAL ECHOCARDIOGRAM (TEE);  Surgeon: Prescott Gum, Collier Salina, MD;  Location: [place];  Service: Open Heart Surgery;  Laterality: N/A;  . TONSILLECTOMY      Family History  Problem Relation Age of Onset  . Heart attack Mother   . Coronary artery disease Mother   . Heart disease Mother        Carotid Stenosis and BPG and Heart Disease before age 67  . Diabetes Mother   . Hypertension Mother   . Heart attack Father   . Heart disease Father        BPG and Heart Disease before age 33  . Hypertension Father   . Cancer Father 63  throat  . Stroke Father   . Colon cancer Neg Hx   . Colon polyps Neg Hx   . Esophageal cancer Neg Hx   . Rectal cancer Neg Hx   . Stomach cancer Neg Hx     Social History Social History   Tobacco Use  . Smoking status: Former Smoker    Packs/day: 1.25    Years: 35.00    Pack years: 43.75    Types: Cigarettes    Quit date: 02/28/2005    Years since quitting: 14.9  . Smokeless tobacco: Current User    Types: Snuff  . Tobacco comment: occaisionally  Vaping Use  . Vaping Use: Never used  Substance Use Topics  . Alcohol use: Yes    Alcohol/week: 8.0 - 10.0 standard drinks    Types: 8 - 10 Glasses of wine per week  . Drug use: No    Current Outpatient Medications  Medication Sig Dispense Refill  . Alirocumab (PRALUENT) 150 MG/ML  SOAJ Inject 1 pen into the skin every 14 (fourteen) days. 2 mL 11  . apixaban (ELIQUIS) 5 MG TABS tablet Take 1 tablet (5 mg total) by mouth 2 (two) times daily. 180 tablet 3  . carvedilol (COREG) 6.25 MG tablet Take 1 tablet (6.25 mg total) by mouth 2 (two) times daily. 180 tablet 3  . colchicine 0.6 MG tablet Take 1 tablet (0.6 mg total) by mouth 2 (two) times daily. 30 tablet 1  . furosemide (LASIX) 20 MG tablet Take 1 tablet (20 mg total) by mouth daily as needed (for weight 168 lb or greater).    . nitroGLYCERIN (NITROSTAT) 0.4 MG SL tablet Place 1 tablet (0.4 mg total) under the tongue every 5 (five) minutes as needed for chest pain. 25 tablet 2  . pantoprazole (PROTONIX) 40 MG tablet Take 1 tablet (40 mg total) by mouth daily. 90 tablet 1  . potassium chloride SA (KLOR-CON) 20 MEQ tablet Take 1 tablet (20 mEq total) by mouth daily as needed (on days your take your furosemide.). 90 tablet 3  . sacubitril-valsartan (ENTRESTO) 24-26 MG Take 1 tablet by mouth 2 (two) times daily. 180 tablet 1  . spironolactone (ALDACTONE) 25 MG tablet Take 1 tablet (25 mg total) by mouth daily. 90 tablet 1  . traMADol (ULTRAM) 50 MG tablet Take 1 tablet (50 mg total) by mouth every 6 (six) hours as needed. 40 tablet 0  . traZODone (DESYREL) 50 MG tablet TAKE 1 TABLET (50 MG TOTAL) BY MOUTH AT BEDTIME AS NEEDED FOR SLEEP. 30 tablet 0  . triamcinolone cream (KENALOG) 0.1 % Apply 1 application topically 2 (two) times daily as needed for itching.     No current facility-administered medications for this visit.    Allergies  Allergen Reactions  . Brilinta [Ticagrelor] Shortness Of Breath  . Chlorhexidine Gluconate Other (See Comments)    Skin burning for hours afterward  . Statins Other (See Comments)    Failed Crestor 5 mg twice weekly, Crestor 20 mg daily, Pravastatin 40 mg qd, Lipitor, Zocor - muscle aches  . Zetia [Ezetimibe] Other (See Comments)    Muscle aches    Review of Systems  No sternal click  or popping No edema imProved appetite, weight stable  BP 118/78   Pulse 67   Temp 97.6 F (36.4 C) (Skin)   Resp 20   Ht 5\' 6"  (1.676 m)   Wt 173 lb (78.5 kg)   SpO2 97% Comment: RA  BMI 27.92 kg/m  Physical Exam      Exam    General- alert and comfortable.  Sternal incision well-healed.    Neck- no JVD, no cervical adenopathy palpable, no carotid bruit   Lungs- clear without rales, wheezes   Cor- regular rate and rhythm, no murmur , gallop   Abdomen- soft, non-tender   Extremities - warm, non-tender, minimal edema   Neuro- oriented, appropriate, no focal weakness  Diagnostic Tests: None  Impression: Excellent recovery after multivessel CABG for ischemic cardiomyopathy with postoperative Impella 5.5 mechanical support. Sternal restriction's are lifted and he may gradually proceed with  normal activities and lifting. Plan: Will be followed by cardiology and return here as needed. Stands importance of heart healthy lifestyle including heart healthy diet and 40 minutes of ambulation aerobic activity 5 days a week.  He knows he should resume aspirin 81 mg a day once the Eliquis is stopped. Len Childs, MD [facility] [phone]

## 2020-01-30 ENCOUNTER — Ambulatory Visit (HOSPITAL_COMMUNITY)
Admission: RE | Admit: 2020-01-30 | Discharge: 2020-01-30 | Disposition: A | Discharge status: Home / Self Care | Payer: PPO | Source: Ambulatory Visit | Attending: Pharmacist | Admitting: Pharmacist

## 2020-01-30 VITALS — BP 120/76 | HR 64 | Wt 173.2 lb

## 2020-01-30 DIAGNOSIS — I447 Left bundle-branch block, unspecified: Secondary | ICD-10-CM | POA: Diagnosis not present

## 2020-01-30 DIAGNOSIS — E785 Hyperlipidemia, unspecified: Secondary | ICD-10-CM | POA: Diagnosis not present

## 2020-01-30 DIAGNOSIS — I252 Old myocardial infarction: Secondary | ICD-10-CM | POA: Diagnosis not present

## 2020-01-30 DIAGNOSIS — I110 Hypertensive heart disease with heart failure: Secondary | ICD-10-CM | POA: Insufficient documentation

## 2020-01-30 DIAGNOSIS — Z79899 Other long term (current) drug therapy: Secondary | ICD-10-CM | POA: Insufficient documentation

## 2020-01-30 DIAGNOSIS — I255 Ischemic cardiomyopathy: Secondary | ICD-10-CM | POA: Diagnosis not present

## 2020-01-30 DIAGNOSIS — I739 Peripheral vascular disease, unspecified: Secondary | ICD-10-CM | POA: Insufficient documentation

## 2020-01-30 DIAGNOSIS — I5022 Chronic systolic (congestive) heart failure: Secondary | ICD-10-CM | POA: Insufficient documentation

## 2020-01-30 DIAGNOSIS — I480 Paroxysmal atrial fibrillation: Secondary | ICD-10-CM | POA: Insufficient documentation

## 2020-01-30 DIAGNOSIS — Z7901 Long term (current) use of anticoagulants: Secondary | ICD-10-CM | POA: Diagnosis not present

## 2020-01-30 DIAGNOSIS — I2510 Atherosclerotic heart disease of native coronary artery without angina pectoris: Secondary | ICD-10-CM | POA: Diagnosis not present

## 2020-01-30 MED ORDER — SACUBITRIL-VALSARTAN 49-51 MG PO TABS
1.0000 | ORAL_TABLET | Freq: Two times a day (BID) | ORAL | 3 refills | Status: DC
Start: 2020-01-30 — End: 2020-07-24

## 2020-01-30 NOTE — Patient Instructions (Signed)
It was a pleasure seeing you today!  MEDICATIONS: -We are changing your medications today -Increase Entresto to 49/51 mg (1 tablet) twice daily. You may take 2 tablets of the 24/26 mg strength twice daily until you receive the new strength.  -Call if you have questions about your medications.   NEXT APPOINTMENT: Return to clinic in 1 month with Dr. [name].  In general, to take care of your heart failure: -Limit your fluid intake to 2 Liters (half-gallon) per day.   -Limit your salt intake to ideally 2-3 grams (2000-3000 mg) per day. -Weigh yourself daily and record, and bring that "weight diary" to your next appointment.  (Weight gain of 2-3 pounds in 1 day typically means fluid weight.) -The medications for your heart are to help your heart and help you live longer.   -Please contact us before stopping any of your heart medications.  Call the clinic at [phone] with questions or to reschedule future appointments.

## 2020-02-06 NOTE — Telephone Encounter (Signed)
Called and confirmed with [employer] that the patient was approved to receive Entresto through 02/27/21.  Charlann Boxer, CPhT

## 2020-03-02 ENCOUNTER — Other Ambulatory Visit (HOSPITAL_COMMUNITY): Payer: Self-pay

## 2020-03-02 ENCOUNTER — Ambulatory Visit (HOSPITAL_COMMUNITY)
Admission: RE | Admit: 2020-03-02 | Discharge: 2020-03-02 | Disposition: A | Discharge status: Home / Self Care | Payer: PPO | Source: Ambulatory Visit | Attending: Cardiology | Admitting: Cardiology

## 2020-03-02 ENCOUNTER — Encounter (HOSPITAL_COMMUNITY): Payer: Self-pay | Admitting: Cardiology

## 2020-03-02 ENCOUNTER — Telehealth (HOSPITAL_COMMUNITY): Payer: Self-pay | Admitting: Pharmacy Technician

## 2020-03-02 ENCOUNTER — Other Ambulatory Visit: Payer: Self-pay

## 2020-03-02 ENCOUNTER — Other Ambulatory Visit (HOSPITAL_COMMUNITY): Payer: Self-pay | Admitting: Cardiology

## 2020-03-02 ENCOUNTER — Ambulatory Visit (HOSPITAL_BASED_OUTPATIENT_CLINIC_OR_DEPARTMENT_OTHER)
Admission: RE | Admit: 2020-03-02 | Discharge: 2020-03-02 | Disposition: A | Discharge status: Home / Self Care | Payer: PPO | Source: Ambulatory Visit | Attending: Cardiology | Admitting: Cardiology

## 2020-03-02 VITALS — BP 120/70 | HR 67 | Ht 66.0 in | Wt 178.0 lb

## 2020-03-02 DIAGNOSIS — I252 Old myocardial infarction: Secondary | ICD-10-CM | POA: Diagnosis not present

## 2020-03-02 DIAGNOSIS — Z951 Presence of aortocoronary bypass graft: Secondary | ICD-10-CM | POA: Insufficient documentation

## 2020-03-02 DIAGNOSIS — I110 Hypertensive heart disease with heart failure: Secondary | ICD-10-CM | POA: Diagnosis not present

## 2020-03-02 DIAGNOSIS — I5022 Chronic systolic (congestive) heart failure: Secondary | ICD-10-CM | POA: Diagnosis not present

## 2020-03-02 DIAGNOSIS — I255 Ischemic cardiomyopathy: Secondary | ICD-10-CM | POA: Insufficient documentation

## 2020-03-02 DIAGNOSIS — E785 Hyperlipidemia, unspecified: Secondary | ICD-10-CM | POA: Diagnosis not present

## 2020-03-02 DIAGNOSIS — Z8249 Family history of ischemic heart disease and other diseases of the circulatory system: Secondary | ICD-10-CM | POA: Diagnosis not present

## 2020-03-02 DIAGNOSIS — Z955 Presence of coronary angioplasty implant and graft: Secondary | ICD-10-CM | POA: Diagnosis not present

## 2020-03-02 DIAGNOSIS — I2510 Atherosclerotic heart disease of native coronary artery without angina pectoris: Secondary | ICD-10-CM | POA: Insufficient documentation

## 2020-03-02 DIAGNOSIS — Z87891 Personal history of nicotine dependence: Secondary | ICD-10-CM | POA: Diagnosis not present

## 2020-03-02 DIAGNOSIS — Z7901 Long term (current) use of anticoagulants: Secondary | ICD-10-CM | POA: Insufficient documentation

## 2020-03-02 DIAGNOSIS — Z7902 Long term (current) use of antithrombotics/antiplatelets: Secondary | ICD-10-CM | POA: Diagnosis not present

## 2020-03-02 DIAGNOSIS — I480 Paroxysmal atrial fibrillation: Secondary | ICD-10-CM | POA: Insufficient documentation

## 2020-03-02 DIAGNOSIS — Z8774 Personal history of (corrected) congenital malformations of heart and circulatory system: Secondary | ICD-10-CM | POA: Diagnosis not present

## 2020-03-02 DIAGNOSIS — I447 Left bundle-branch block, unspecified: Secondary | ICD-10-CM | POA: Diagnosis not present

## 2020-03-02 DIAGNOSIS — Z79899 Other long term (current) drug therapy: Secondary | ICD-10-CM | POA: Diagnosis not present

## 2020-03-02 DIAGNOSIS — I25118 Atherosclerotic heart disease of native coronary artery with other forms of angina pectoris: Secondary | ICD-10-CM

## 2020-03-02 LAB — CBC
HCT: 44.6 % (ref 39.0–52.0)
Hemoglobin: 14 g/dL (ref 13.0–17.0)
MCH: 28.6 pg (ref 26.0–34.0)
MCHC: 31.4 g/dL (ref 30.0–36.0)
MCV: 91 fL (ref 80.0–100.0)
Platelets: 192 10*3/uL (ref 150–400)
RBC: 4.9 MIL/uL (ref 4.22–5.81)
RDW: 15.3 % (ref 11.5–15.5)
WBC: 4.1 10*3/uL (ref 4.0–10.5)
nRBC: 0 % (ref 0.0–0.2)

## 2020-03-02 LAB — BASIC METABOLIC PANEL
Anion gap: 11 (ref 5–15)
BUN: 34 mg/dL — ABNORMAL HIGH (ref 8–23)
CO2: 23 mmol/L (ref 22–32)
Calcium: 9.4 mg/dL (ref 8.9–10.3)
Chloride: 101 mmol/L (ref 98–111)
Creatinine, Ser: 1.07 mg/dL (ref 0.61–1.24)
GFR, Estimated: >60 mL/min (ref >60)
Glucose, Bld: 105 mg/dL — ABNORMAL HIGH (ref 70–99)
Potassium: 5.4 mmol/L — ABNORMAL HIGH (ref 3.5–5.1)
Sodium: 135 mmol/L (ref 135–145)

## 2020-03-02 LAB — ECHOCARDIOGRAM COMPLETE
Area-P 1/2: 2.56 cm2
Height: 66 in
P 1/2 time: 1179 msec
S' Lateral: 4 cm
Single Plane A4C EF: 44.3 %

## 2020-03-02 MED ORDER — DAPAGLIFLOZIN PROPANEDIOL 10 MG PO TABS: 10.0000 mg | ORAL_TABLET | Freq: Every day | ORAL | 3 refills | Status: DC

## 2020-03-02 MED ORDER — CARVEDILOL 6.25 MG PO TABS
9.3750 mg | ORAL_TABLET | Freq: Two times a day (BID) | ORAL | 3 refills | Status: DC
Start: 2020-03-02 — End: 2020-05-20

## 2020-03-02 NOTE — Progress Notes (Signed)
  Echocardiogram 2D Echocardiogram has been performed.  [name] 03/02/2020, 9:36 AM

## 2020-03-02 NOTE — Progress Notes (Signed)
PCP: Dr Army Melia  HF Cardiology: Dr [name]  Cardiology: Dr End  CT Surgeon: Dr Prescott Gum  HPI: Mr [name] is a 70 y.o. with a history of CAD with NSTEMI 2017 s/p PCI to LCX , LBBB, HTN, PAD, RCEA, left femoral stent ([place] term plavix), and hyperlipidemia.   Had ECHO 2019 that showed EF 40-45%.    Presented to [facility] in 9/21 for outpatient heart cath. Cath showed left main disease. ECHO completed and showed EF 25-30% with normal RV. Transferred to [facility] for CT surgery consultation. Deemed appropriate for CABG. S/P CABG LIMA-LAD, SVG-OM1, SVG-OM2 and SVG-PDA. Post-op, he required Impella 5.5. Had atrial fibrillation. Placed IV amio and transitioned to po amio. Diuresed with IV lasix and transitioned to 40 mg po lasix daily.  Now using Lasix only prn.   Echo was done today and reviewed, EF 30-35% with diffuse hypokinesis and septal-lateral dyssynchrony consistent with LBBB, mildly decreased RV systolic function.   Today he returns for followup of CHF and CAD.  He is in NSR, no longer taking amiodarone. He is off digoxin with elevated level and nausea.  No chest pain.  He goes to the gym where he rides exercise bike and lifts weights 3 times/week.  He golfs 3-4 days/week.  No dyspnea except with heavy exertion.  No chest pain.  No orthopnea/PND.  Weight is up about 6 lbs since last appointment.   ECG (personally reviewed): NSR, PVC, LBBB 148 msec  Labs (10/21): K 4.5, creatinine 0.98 Labs (11/21): LDL 50, HDL 62, K 4.2, creatinine 0.89  PMH:  1. HTN 2. GERD with esophageal stenosis and dilation.  3. Hyperlipidemia.  4. CAD: NSTEMI in 2017 with PCI to LCx.  - LHC (9/21) with severe 3 vessel disease.   - CABG (9/21) with LIMA-LAD, SVG-OM1, SVG-OM2, SVG-PDA.  5. Atrial fibrillation: Paroxysmal.  6. Carotid stenosis: Right CEA.  - Carotid dopplers (0000000): 123456 LICA 7. PAD: Left SFA stent.  8. H/o LBBB 9. Chronic systolic CHF: Ischemic cardiomyopathy.  - Echo (2019): EF 40-45%.  - Echo  (9/21): EF 25-30%, normal RV.  - Echo (1/22): EF 30-35% with diffuse hypokinesis and septal-lateral dyssynchrony consistent with LBBB, mildly decreased RV systolic function.   ROS: All systems negative except as listed in HPI, PMH and Problem List.  Social History   Socioeconomic History  . Marital status: Married    Spouse name: Not on file  . Number of children: 1  . Years of education: Not on file  . Highest education level: Bachelor's degree (e.g., BA, AB, BS)  Occupational History  . Not on file  Tobacco Use  . Smoking status: Former Smoker    Packs/day: 1.25    Years: 35.00    Pack years: 43.75    Types: Cigarettes    Quit date: 02/28/2005    Years since quitting: 15.0  . Smokeless tobacco: Current User    Types: Snuff  . Tobacco comment: occaisionally  Vaping Use  . Vaping Use: Never used  Substance and Sexual Activity  . Alcohol use: Yes    Alcohol/week: 8.0 - 10.0 standard drinks    Types: 8 - 10 Glasses of wine per week  . Drug use: No  . Sexual activity: Yes  Other Topics Concern  . Not on file  Social History Narrative  . Not on file   Social Determinants of Health   Financial Resource Strain: Low Risk   . Difficulty of Paying Living Expenses: Not hard at all  Food Insecurity: No Food Insecurity  . Worried About Charity fundraiser in the Last Year: Never true  . Ran Out of Food in the Last Year: Never true  Transportation Needs: No Transportation Needs  . Lack of Transportation (Medical): No  . Lack of Transportation (Non-Medical): No  Physical Activity: Sufficiently Active  . Days of Exercise per Week: 4 days  . Minutes of Exercise per Session: 60 min  Stress: No Stress Concern Present  . Feeling of Stress : Not at all  Social Connections: Moderately Integrated  . Frequency of Communication with Friends and Family: More than three times a week  . Frequency of Social Gatherings with Friends and Family: Three times a week  . Attends Religious  Services: Never  . Active Member of Clubs or Organizations: Yes  . Attends Archivist Meetings: More than 4 times per year  . Marital Status: Married  Human resources officer Violence: Not At Risk  . Fear of Current or Ex-Partner: No  . Emotionally Abused: No  . Physically Abused: No  . Sexually Abused: No    Family History  Problem Relation Age of Onset  . Heart attack Mother   . Coronary artery disease Mother   . Heart disease Mother        Carotid Stenosis and BPG and Heart Disease before age 47  . Diabetes Mother   . Hypertension Mother   . Heart attack Father   . Heart disease Father        BPG and Heart Disease before age 67  . Hypertension Father   . Cancer Father 55       throat  . Stroke Father   . Colon cancer Neg Hx   . Colon polyps Neg Hx   . Esophageal cancer Neg Hx   . Rectal cancer Neg Hx   . Stomach cancer Neg Hx      Current Outpatient Medications  Medication Sig Dispense Refill  . Alirocumab (PRALUENT) 150 MG/ML SOAJ Inject 1 pen into the skin every 14 (fourteen) days. 2 mL 11  . apixaban (ELIQUIS) 5 MG TABS tablet Take 1 tablet (5 mg total) by mouth 2 (two) times daily. 180 tablet 3  . colchicine 0.6 MG tablet Take 0.6 mg by mouth daily as needed.    . dapagliflozin propanediol (FARXIGA) 10 MG TABS tablet Take 1 tablet (10 mg total) by mouth daily before breakfast. 30 tablet 3  . nitroGLYCERIN (NITROSTAT) 0.4 MG SL tablet Place 1 tablet (0.4 mg total) under the tongue every 5 (five) minutes as needed for chest pain. 25 tablet 2  . pantoprazole (PROTONIX) 40 MG tablet Take 1 tablet (40 mg total) by mouth daily. 90 tablet 1  . sacubitril-valsartan (ENTRESTO) 49-51 MG Take 1 tablet by mouth 2 (two) times daily. 180 tablet 3  . spironolactone (ALDACTONE) 25 MG tablet Take 1 tablet (25 mg total) by mouth daily. 90 tablet 1  . traMADol (ULTRAM) 50 MG tablet Take 1 tablet (50 mg total) by mouth every 6 (six) hours as needed. 40 tablet 0  . traZODone  (DESYREL) 50 MG tablet TAKE 1 TABLET BY MOUTH AT BEDTIME AS NEEDED FOR SLEEP. 90 tablet 1  . triamcinolone cream (KENALOG) 0.1 % Apply 1 application topically 2 (two) times daily as needed for itching.    . carvedilol (COREG) 6.25 MG tablet Take 1.5 tablets (9.375 mg total) by mouth 2 (two) times daily. 135 tablet 3  . FLUZONE HIGH-DOSE QUADRIVALENT 0.7 ML  SUSY     Laurita Quint injection      No current facility-administered medications for this encounter.    Vitals:   03/02/20 0935  BP: 120/70  Pulse: 67  SpO2: 96%  Weight: 80.7 kg (178 lb)  Height: 5\' 6"  (1.676 m)   Wt Readings from Last 3 Encounters:  03/02/20 80.7 kg (178 lb)  01/30/20 78.6 kg (173 lb 3.2 oz)  01/29/20 78.5 kg (173 lb)    PHYSICAL EXAM: General: NAD Neck: No JVD, no thyromegaly or thyroid nodule.  Lungs: Clear to auscultation bilaterally with normal respiratory effort. CV: Nondisplaced PMI.  Heart regular S1/S2, no S3/S4, no murmur.  No peripheral edema.  No carotid bruit.  Normal pedal pulses.  Abdomen: Soft, nontender, no hepatosplenomegaly, no distention.  Skin: Intact without lesions or rashes.  Neurologic: Alert and oriented x 3.  Psych: Normal affect. Extremities: No clubbing or cyanosis.  HEENT: Normal.   ASSESSMENT & PLAN: 1. CAD: H/o PCI to LCX in 2017. LHC 9/7/21with multivessel CAD, including calcified 80% distal LM stenosis, small diffusely diseased distal LAD, chronically occluded OM1, and 50% mid RCA lesion.  He is now s/p LIMA-LAD, SVG-OM1, SVG-OM2 and SVG-PDA. No chest pain.  - Intolerant of statins, he is on Praluent now.  Good lipids in 11/21.  - No ASA given apixaban use.  2. Chronic systolic CHF: Ischemic cardiomyopathy.  Echo in 9/21 with EF 25-30%.  Repeat echo was done today showing EF 30-35% with diffuse hypokinesis and septal-lateral dyssynchrony consistent with LBBB, mildly decreased RV systolic function.  NYHA II. He is not volume overloaded.  - Increase Coreg to 9.375 mg bid.  -  Add Farxiga 10 mg daily.  BMET today and again in 10 days.   - Continue Entresto 49/51 mg bid. BMET today.  - Continue spironolactone 25 mg daily  - EF is persistently low.  He has LBBB, not markedly wide today at 148 msec but he has prominent septal-lateral dyssynchrony on echo.  I think he needs CRT-D implant, will refer to EP.  3. Atrial fibrillation: Paroxysmal.  Noted peri-op.  He is now off amiodarone. NSR today.  - Continue apixaban. Check CBC.   Followup in 4 wks with HF pharmacist for med titration, see me in 2 months.   10/21 03/02/2020

## 2020-03-02 NOTE — Telephone Encounter (Signed)
Patient was started on Farxiga in clinic today. Upon investigation, the patient's insurance prefers [place]. Asked Marchelle Folks, RN to send in the new prescription.   Called and spoke with the patient. He has not picked up [place] and is aware that he should not. He is going to pick up the [place] later today.   Also started an application for [facility]. Emailed the application to the patient. Will fax in once signatures are obtained.

## 2020-03-02 NOTE — Patient Instructions (Addendum)
START Farxiga 10mg  (1 tablet) daily  INCREASE Carvedilol 9.375mg  (1 1/2 tablets) twice daily  Labs done today, your results will be available in MyChart, we will contact you for abnormal readings.  Your physician recommends that you have repeat labs drawn. A prescription has been sent with you to have it done locally  You have been referred to EP office. They will contact you to schedule.  Your physician recommends that you schedule a follow-up appointment in: 1 month with pharmacy  Your physician recommends that you schedule a follow-up appointment in: 2 months with Dr.  If you have any questions or concerns before your next appointment please send Shirlee Latch a message through [facility] or call our office at [phone].    TO LEAVE A MESSAGE FOR THE NURSE SELECT OPTION 2, PLEASE LEAVE A MESSAGE INCLUDING: . YOUR NAME . DATE OF BIRTH . CALL BACK NUMBER . REASON FOR CALL**this is important as we prioritize the call backs  YOU WILL RECEIVE A CALL BACK THE SAME DAY AS [place] AS YOU CALL BEFORE 4:00 PM

## 2020-03-03 ENCOUNTER — Ambulatory Visit: Payer: PPO | Admitting: Cardiology

## 2020-03-03 ENCOUNTER — Encounter (HOSPITAL_COMMUNITY): Payer: Self-pay

## 2020-03-03 ENCOUNTER — Encounter: Payer: Self-pay | Admitting: Cardiology

## 2020-03-03 VITALS — BP 102/70 | HR 70 | Ht 66.0 in | Wt 176.0 lb

## 2020-03-03 DIAGNOSIS — I480 Paroxysmal atrial fibrillation: Secondary | ICD-10-CM

## 2020-03-03 DIAGNOSIS — I5022 Chronic systolic (congestive) heart failure: Secondary | ICD-10-CM | POA: Diagnosis not present

## 2020-03-03 DIAGNOSIS — I447 Left bundle-branch block, unspecified: Secondary | ICD-10-CM | POA: Diagnosis not present

## 2020-03-03 DIAGNOSIS — I428 Other cardiomyopathies: Secondary | ICD-10-CM

## 2020-03-03 NOTE — Addendum Note (Signed)
Addended by: Alois Cliche on: 03/03/2020 05:51 PM   Modules accepted: Orders

## 2020-03-03 NOTE — Progress Notes (Signed)
Electrophysiology Office Note:    Date:  03/03/2020   ID:  [name], DOB 03-Nov-1950, MRN [id number]  PCP:  Glean Hess, MD  [facility] Cardiologist:  Nelva Bush, MD  [facility] Electrophysiologist:  Vickie Epley, MD   Referring MD: Larey Dresser, MD   Chief Complaint: Ischemic cardiomyopathy  History of Present Illness:    [name] is a 70 y.o. male who presents for an evaluation of ischemic cardiomyopathy at the request of Dr. [name]. Their medical history includes coronary artery disease with prior PCI to left circumflex, left bundle branch block, hypertension, PAD, left femoral stent and hyperlipidemia.  He last saw Dr. [name] on March 02, 2020.  This appointment was after he presented to [facility] in September 2021 for an outpatient heart catheterization.  Heart catheterization showed significant left main disease with an echocardiogram showing a left ventricular function of 25 to 30%.  He was transferred to main campus where he underwent bypass surgery with LIMA to LAD, vein to OM1, vein to OM 2, vein to PDA.  He did require an Impella 5.5 postoperatively.  His postop course was also complicated by atrial fibrillation requiring IV amiodarone.  Echocardiogram performed yesterday showed a persistently decreased left ventricular function with an ejection fraction measured at 30%.  He is doing well.  He plays golf and exercises at the gym multiple times per week.  Very minimal shortness of breath or limitations in his activities.  Past Medical History:  Diagnosis Date  . Abnormal nuclear cardiac imaging test 08/08/2015  . Arthritis    fingers  . Carotid artery occlusion   . CHF (congestive heart failure) ([place])   . Coronary atherosclerosis of native coronary artery 01/29/2013   11/05/19 R/LHC 80% dLMCA stenosis small diffusely dz dLAD, chronically occluded OM1 50%mRCA lesion, widely patent mLCx strent, moderately elevated L heart filling pressures,  mild to moderate RH filling pressures, normal to moderately reduced CO  . Duodenal erosion   . Encounter for screening for lung cancer 07/13/2016  . Esophageal stenosis    esophageal dilation  . GERD (gastroesophageal reflux disease)   . H. pylori infection   . Heart attack [facility]) Oct. 2009   Mild  . Hiatal hernia   . Hyperlipidemia   . Hypertension   . 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 Cardiac catheterization-nonobstructive 60% RCA disease-subsequent nuclear stress test-9 minutes, low risk, mild inferior wall hypokinesis   . Pain in limb 12/19/2017  . Peripheral vascular disease ([place])   . Unstable angina ([place]) 11/25/2017    Past Surgical History:  Procedure Laterality Date  . APPENDECTOMY    . BACK SURGERY    . CARDIAC CATHETERIZATION N/A 08/07/2015   Procedure: Left Heart Cath and Coronary Angiography;  Surgeon: [name], MD;  Location: [place] [facility];  Service: Cardiovascular;  Laterality: N/A;  . CARDIAC CATHETERIZATION N/A 08/07/2015   Procedure: Coronary Stent Intervention;  Surgeon: [name], MD;  Location: [place] [facility];  Service: Cardiovascular;  Laterality: N/A;  . CARDIAC CATHETERIZATION N/A 08/07/2015   Procedure: Coronary Stent Intervention;  Surgeon: [name], MD;  Location: [place] [facility];  Service: Cardiovascular;  Laterality: N/A;  . CAROTID ENDARTERECTOMY  01/05/2006   Right  CEA with DPA  . CATARACT EXTRACTION W/ INTRAOCULAR LENS IMPLANT Left 12/04/2017  . CATARACT EXTRACTION W/PHACO Left 12/04/2017   Procedure: CATARACT EXTRACTION PHACO AND INTRAOCULAR LENS  PLACEMENT ([place]) LEFT;  Surgeon: Eulogio Bear, MD;  Location: [place];  Service: Ophthalmology;  Laterality: Left;  . CATARACT EXTRACTION W/PHACO Right 02/06/2018   Procedure: CATARACT EXTRACTION PHACO AND INTRAOCULAR LENS PLACEMENT (IOC)RIGHT;  Surgeon: Eulogio Bear, MD;  Location: [place];   Service: Ophthalmology;  Laterality: Right;  . COLONOSCOPY  05/20/2008  . COLONOSCOPY WITH PROPOFOL N/A 09/13/2018   Procedure: COLONOSCOPY WITH BIOPSY;  Surgeon: [name], MD;  Location: [place];  Service: Endoscopy;  Laterality: N/A;  . CORONARY ARTERY BYPASS GRAFT N/A 11/11/2019   Procedure: CORONARY ARTERY BYPASS GRAFTING (CABG) USING LIMA to Diag1; ENDOSCOPICALLY HARVESTED RIGHT GREATER SAPHENOUS VEIN: SVG to OM1; SVG to OM2; SVG to PDA.;  Surgeon: Ivin Poot, MD;  Location: [place];  Service: Open Heart Surgery;  Laterality: N/A;  . CORONARY STENT PLACEMENT  08/07/2015   MID CIRCUMFLEX  . ENDOVEIN HARVEST OF GREATER SAPHENOUS VEIN Right 11/11/2019   Procedure: ENDOVEIN HARVEST OF GREATER SAPHENOUS VEIN;  Surgeon: Ivin Poot, MD;  Location: [place];  Service: Open Heart Surgery;  Laterality: Right;  . ESOPHAGOGASTRODUODENOSCOPY (EGD) WITH PROPOFOL N/A 02/11/2019   Procedure: ESOPHAGOGASTRODUODENOSCOPY (EGD) WITH BIOPSY and  Dilation;  Surgeon: [name], MD;  Location: [place];  Service: Endoscopy;  Laterality: N/A;  . HIP SURGERY Left 10/2016   left hip tendon repair  . LEFT HEART CATH AND CORONARY ANGIOGRAPHY N/A 11/27/2017   Procedure: LEFT HEART CATH AND CORONARY ANGIOGRAPHY;  Surgeon: Wellington Hampshire, MD;  Location: [place] [facility];  Service: Cardiovascular;  Laterality: N/A;  . LOWER EXTREMITY ANGIOGRAPHY Left 02/12/2018   Procedure: LOWER EXTREMITY ANGIOGRAPHY;  Surgeon: Algernon Huxley, MD;  Location: [place] [facility];  Service: Cardiovascular;  Laterality: Left;  . LOWER EXTREMITY ANGIOGRAPHY Left 03/07/2018   Procedure: LOWER EXTREMITY ANGIOGRAPHY;  Surgeon: Algernon Huxley, MD;  Location: [place] [facility];  Service: Cardiovascular;  Laterality: Left;  . LOWER EXTREMITY ANGIOGRAPHY Left 06/04/2018   Procedure: LOWER EXTREMITY ANGIOGRAPHY;  Surgeon: Algernon Huxley, MD;  Location: [place] [facility];  Service: Cardiovascular;  Laterality:  Left;  . PLACEMENT OF IMPELLA LEFT VENTRICULAR ASSIST DEVICE N/A 11/11/2019   Procedure: PLACEMENT OF IMPELLA LEFT VENTRICULAR ASSIST DEVICE 5.5;  Surgeon: Ivin Poot, MD;  Location: [place];  Service: Open Heart Surgery;  Laterality: N/A;  Midline Sternotomy  . POLYPECTOMY N/A 09/13/2018   Procedure: POLYPECTOMY;  Surgeon: [name], MD;  Location: [place];  Service: Endoscopy;  Laterality: N/A;  . POLYPECTOMY N/A 02/11/2019   Procedure: POLYPECTOMY;  Surgeon: [name], MD;  Location: [place];  Service: Endoscopy;  Laterality: N/A;  . REMOVAL OF IMPELLA LEFT VENTRICULAR ASSIST DEVICE N/A 11/15/2019   Procedure: REMOVAL OF IMPELLA 5.5 LEFT VENTRICULAR ASSIST DEVICE;  Surgeon: Ivin Poot, MD;  Location: [place];  Service: Open Heart Surgery;  Laterality: N/A;  . RIGHT/LEFT HEART CATH AND CORONARY ANGIOGRAPHY N/A 11/05/2019   Procedure: RIGHT/LEFT HEART CATH AND CORONARY ANGIOGRAPHY;  Surgeon: Nelva Bush, MD;  Location: [place] [facility];  Service: Cardiovascular;  Laterality: N/A;  . SPINE SURGERY    . TEE WITHOUT CARDIOVERSION N/A 11/11/2019   Procedure: TRANSESOPHAGEAL ECHOCARDIOGRAM (TEE);  Surgeon: Prescott Gum, Collier Salina, MD;  Location: [place];  Service: Open Heart Surgery;  Laterality: N/A;  . TEE WITHOUT CARDIOVERSION N/A 11/15/2019   Procedure: TRANSESOPHAGEAL ECHOCARDIOGRAM (TEE);  Surgeon: Prescott Gum, Collier Salina, MD;  Location: [place];  Service: Open  Heart Surgery;  Laterality: N/A;  . TONSILLECTOMY      Current Medications: Current Meds  Medication Sig  . Alirocumab (PRALUENT) 150 MG/ML SOAJ Inject 1 pen into the skin every 14 (fourteen) days.  Marland Kitchen apixaban (ELIQUIS) 5 MG TABS tablet Take 1 tablet (5 mg total) by mouth 2 (two) times daily.  . carvedilol (COREG) 6.25 MG tablet Take 1.5 tablets (9.375 mg total) by mouth 2 (two) times daily.  . colchicine 0.6 MG tablet Take 0.6 mg by mouth daily as needed.  . empagliflozin (JARDIANCE) 10 MG TABS tablet Take 1  tablet (10 mg total) by mouth daily before breakfast.  . FLUZONE HIGH-DOSE QUADRIVALENT 0.7 ML SUSY   . nitroGLYCERIN (NITROSTAT) 0.4 MG SL tablet Place 1 tablet (0.4 mg total) under the tongue every 5 (five) minutes as needed for chest pain.  . pantoprazole (PROTONIX) 40 MG tablet Take 1 tablet (40 mg total) by mouth daily.  . sacubitril-valsartan (ENTRESTO) 49-51 MG Take 1 tablet by mouth 2 (two) times daily.  Marland Kitchen SHINGRIX injection   . spironolactone (ALDACTONE) 25 MG tablet Take 1 tablet (25 mg total) by mouth daily.  . traMADol (ULTRAM) 50 MG tablet Take 1 tablet (50 mg total) by mouth every 6 (six) hours as needed.  . traZODone (DESYREL) 50 MG tablet TAKE 1 TABLET BY MOUTH AT BEDTIME AS NEEDED FOR SLEEP.  Marland Kitchen triamcinolone cream (KENALOG) 0.1 % Apply 1 application topically 2 (two) times daily as needed for itching.     Allergies:   Brilinta [ticagrelor], Chlorhexidine gluconate, Statins, and Zetia [ezetimibe]   Social History   Socioeconomic History  . Marital status: Married    Spouse name: Not on file  . Number of children: 1  . Years of education: Not on file  . Highest education level: Bachelor's degree (e.g., BA, AB, BS)  Occupational History  . Not on file  Tobacco Use  . Smoking status: Former Smoker    Packs/day: 1.25    Years: 35.00    Pack years: 43.75    Types: Cigarettes    Quit date: 02/28/2005    Years since quitting: 15.0  . Smokeless tobacco: Current User    Types: Snuff  . Tobacco comment: occaisionally  Vaping Use  . Vaping Use: Never used  Substance and Sexual Activity  . Alcohol use: Yes    Alcohol/week: 8.0 - 10.0 standard drinks    Types: 8 - 10 Glasses of wine per week  . Drug use: No  . Sexual activity: Yes  Other Topics Concern  . Not on file  Social History Narrative  . Not on file   Social Determinants of Health   Financial Resource Strain: Low Risk   . Difficulty of Paying Living Expenses: Not hard at all  Food Insecurity: No Food  Insecurity  . Worried About Charity fundraiser in the Last Year: Never true  . Ran Out of Food in the Last Year: Never true  Transportation Needs: No Transportation Needs  . Lack of Transportation (Medical): No  . Lack of Transportation (Non-Medical): No  Physical Activity: Sufficiently Active  . Days of Exercise per Week: 4 days  . Minutes of Exercise per Session: 60 min  Stress: No Stress Concern Present  . Feeling of Stress : Not at all  Social Connections: Moderately Integrated  . Frequency of Communication with Friends and Family: More than three times a week  . Frequency of Social Gatherings with Friends and Family: Three times a  week  . Attends Religious Services: Never  . Active Member of Clubs or Organizations: Yes  . Attends Archivist Meetings: More than 4 times per year  . Marital Status: Married     Family History: The patient's family history includes Cancer (age of onset: 37) in his father; Coronary artery disease in his mother; Diabetes in his mother; Heart attack in his father and mother; Heart disease in his father and mother; Hypertension in his father and mother; Stroke in his father. There is no history of Colon cancer, Colon polyps, Esophageal cancer, Rectal cancer, or Stomach cancer.  ROS:   Please see the history of present illness.    All other systems reviewed and are negative.  EKGs/Labs/Other Studies Reviewed:    The following studies were reviewed today: Prior notes, echo, EKG  March 02, 2020 echo personally reviewed Left ventricular function severely decreased, 30% Dyssynchrony consistent with left bundle branch block   EKG:  The ekg yesterday shows a left bundle branch block, sinus rhythm  Recent Labs: 11/07/2019: TSH 3.303 11/16/2019: Magnesium 2.0 11/18/2019: ALT 16 12/04/2019: BNP 202.7 03/02/2020: BUN 34; Creatinine, Ser 1.07; Hemoglobin 14.0; Platelets 192; Potassium 5.4; Sodium 135  Recent Lipid Panel    Component Value  Date/Time   CHOL 144 01/07/2020 1601   CHOL 144 12/10/2018 0925   TRIG 158 (H) 01/07/2020 1601   HDL 62 01/07/2020 1601   HDL 59 12/10/2018 0925   CHOLHDL 2.3 01/07/2020 1601   VLDL 32 01/07/2020 1601   LDLCALC 50 01/07/2020 1601   LDLCALC 68 12/10/2018 0925    Physical Exam:    VS:  BP 102/70   Pulse 70   Ht 5\' 6"  (1.676 m)   Wt 176 lb (79.8 kg)   SpO2 96%   BMI 28.41 kg/m     Wt Readings from Last 3 Encounters:  03/03/20 176 lb (79.8 kg)  03/02/20 178 lb (80.7 kg)  01/30/20 173 lb 3.2 oz (78.6 kg)     GEN:  Well nourished, well developed in no acute distress HEENT: Normal NECK: No JVD; No carotid bruits LYMPHATICS: No lymphadenopathy CARDIAC: RRR, no murmurs, rubs, gallops RESPIRATORY:  Clear to auscultation without rales, wheezing or rhonchi  ABDOMEN: Soft, non-tender, non-distended MUSCULOSKELETAL:  No edema; No deformity  SKIN: Warm and dry NEUROLOGIC:  Alert and oriented x 3 PSYCHIATRIC:  Normal affect   ASSESSMENT:    1. Chronic systolic heart failure ([place])   2. Nonischemic cardiomyopathy ([place])   3. LBBB (left bundle branch block)   4. PAF (paroxysmal atrial fibrillation) ([facility])    PLAN:    In order of problems listed above:  1. Chronic systolic heart failure secondary to ischemic cardiomyopathy NYHA class II symptoms, warm and dry on exam.  Left bundle-branch block.  EF 30%. On good medical therapy with carvedilol, Entresto, spironolactone, Jardiance. Discussed CRT-D implant with the patient today given his persistently low ejection fraction and left bundle branch block.  Discussed procedural details, risks and expected recovery.  He would like to proceed.  Risks, benefits, and alternatives to CRT-D were discussed in detail today.  The patient understands that risks include but are not limited to bleeding, infection, pneumothorax, perforation, tamponade, vascular damage, renal failure, MI, stroke, death, inappropriate shocks and lead dislodgement and  wishes to proceed.  We will therefore schedule the procedure at the next available time.  2.  Atrial fibrillation Maintaining normal rhythm off amiodarone and digoxin.  Continue Eliquis twice daily.  We will  need to stop this 2 days prior to the CRT-D implant and we will plan to restart it 5 days postop.    Medication Adjustments/Labs and Tests Ordered: Current medicines are reviewed at length with the patient today.  Concerns regarding medicines are outlined above.  No orders of the defined types were placed in this encounter.  No orders of the defined types were placed in this encounter.    Signed, Steffanie Dunn, MD, [facility]  03/03/2020 10:12 AM    Electrophysiology [facility]

## 2020-03-03 NOTE — Patient Instructions (Signed)
Medication Instructions:  Your physician recommends that you continue on your current medications as directed. Please refer to the Current Medication list given to you today.  *If you need a refill on your cardiac medications before your next appointment, please call your pharmacy*   Lab Work: None ordered today If you have labs (blood work) drawn today and your tests are completely normal, you will receive your results only by: Marland Kitchen MyChart Message (if you have MyChart) OR . A paper copy in the mail If you have any lab test that is abnormal or we need to change your treatment, we will call you to review the results.   Testing/Procedures: CRT or cardiac resynchronization therapy is a treatment used to correct heart failure. When you have heart failure your heart is weakened and doesn't pump as well as it should. This therapy may help reduce symptoms and improve the quality of life.  Please see the handout/brochure given to you today to get more information of the different options of therapy.  Your physician has recommended that you have a defibrillator inserted. An implantable cardioverter defibrillator (ICD) is a small device that is placed in your chest or, in rare cases, your abdomen. This device uses electrical pulses or shocks to help control life-threatening, irregular heartbeats that could lead the heart to suddenly stop beating (sudden cardiac arrest). Leads are attached to the ICD that goes into your heart. This is done in the hospital and usually requires an overnight stay. Please see the instruction sheet given to you today for more information.    Follow-Up: At [facility], you and your health needs are our priority.  As part of our continuing mission to provide you with exceptional heart care, we have created designated Provider Care Teams.  These Care Teams include your primary Cardiologist (physician) and Advanced Practice Providers (APPs -  Physician Assistants and Nurse  Practitioners) who all work together to provide you with the care you need, when you need it.  We recommend signing up for the patient portal called "MyChart".  Sign up information is provided on this After Visit Summary.  MyChart is used to connect with patients for Virtual Visits (Telemedicine).  Patients are able to view lab/test results, encounter notes, upcoming appointments, etc.  Non-urgent messages can be sent to your provider as well.   To learn more about what you can do with MyChart, go to [email or web address].    Your next appointment:   To be scheduled

## 2020-03-03 NOTE — H&P (View-Only) (Signed)
Electrophysiology Office Note:    Date:  03/03/2020   ID:  [name], DOB 08-19-1950, MRN [id number]  PCP:  Glean Hess, MD  [facility] Cardiologist:  Nelva Bush, MD  [facility] Electrophysiologist:  Vickie Epley, MD   Referring MD: Larey Dresser, MD   Chief Complaint: Ischemic cardiomyopathy  History of Present Illness:    [name] is a 70 y.o. male who presents for an evaluation of ischemic cardiomyopathy at the request of Dr. [name]. Their medical history includes coronary artery disease with prior PCI to left circumflex, left bundle branch block, hypertension, PAD, left femoral stent and hyperlipidemia.  He last saw Dr. [name] on March 02, 2020.  This appointment was after he presented to [facility] in September 2021 for an outpatient heart catheterization.  Heart catheterization showed significant left main disease with an echocardiogram showing a left ventricular function of 25 to 30%.  He was transferred to main campus where he underwent bypass surgery with LIMA to LAD, vein to OM1, vein to OM 2, vein to PDA.  He did require an Impella 5.5 postoperatively.  His postop course was also complicated by atrial fibrillation requiring IV amiodarone.  Echocardiogram performed yesterday showed a persistently decreased left ventricular function with an ejection fraction measured at 30%.  He is doing well.  He plays golf and exercises at the gym multiple times per week.  Very minimal shortness of breath or limitations in his activities.  Past Medical History:  Diagnosis Date  . Abnormal nuclear cardiac imaging test 08/08/2015  . Arthritis    fingers  . Carotid artery occlusion   . CHF (congestive heart failure) ([place])   . Coronary atherosclerosis of native coronary artery 01/29/2013   11/05/19 R/LHC 80% dLMCA stenosis small diffusely dz dLAD, chronically occluded OM1 50%mRCA lesion, widely patent mLCx strent, moderately elevated L heart filling pressures,  mild to moderate RH filling pressures, normal to moderately reduced CO  . Duodenal erosion   . Encounter for screening for lung cancer 07/13/2016  . Esophageal stenosis    esophageal dilation  . GERD (gastroesophageal reflux disease)   . H. pylori infection   . Heart attack [facility]) Oct. 2009   Mild  . Hiatal hernia   . Hyperlipidemia   . Hypertension   . 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 Cardiac catheterization-nonobstructive 60% RCA disease-subsequent nuclear stress test-9 minutes, low risk, mild inferior wall hypokinesis   . Pain in limb 12/19/2017  . Peripheral vascular disease ([place])   . Unstable angina ([place]) 11/25/2017    Past Surgical History:  Procedure Laterality Date  . APPENDECTOMY    . BACK SURGERY    . CARDIAC CATHETERIZATION N/A 08/07/2015   Procedure: Left Heart Cath and Coronary Angiography;  Surgeon: [name], MD;  Location: [place] [facility];  Service: Cardiovascular;  Laterality: N/A;  . CARDIAC CATHETERIZATION N/A 08/07/2015   Procedure: Coronary Stent Intervention;  Surgeon: [name], MD;  Location: [place] [facility];  Service: Cardiovascular;  Laterality: N/A;  . CARDIAC CATHETERIZATION N/A 08/07/2015   Procedure: Coronary Stent Intervention;  Surgeon: [name], MD;  Location: [place] CV [facility];  Service: Cardiovascular;  Laterality: N/A;  . CAROTID ENDARTERECTOMY  01/05/2006   Right  CEA with DPA  . CATARACT EXTRACTION W/ INTRAOCULAR LENS IMPLANT Left 12/04/2017  . CATARACT EXTRACTION W/PHACO Left 12/04/2017   Procedure: CATARACT EXTRACTION PHACO AND INTRAOCULAR LENS  PLACEMENT ([place]) LEFT;  Surgeon: Eulogio Bear, MD;  Location: [place];  Service: Ophthalmology;  Laterality: Left;  . CATARACT EXTRACTION W/PHACO Right 02/06/2018   Procedure: CATARACT EXTRACTION PHACO AND INTRAOCULAR LENS PLACEMENT (IOC)RIGHT;  Surgeon: Eulogio Bear, MD;  Location: [place];   Service: Ophthalmology;  Laterality: Right;  . COLONOSCOPY  05/20/2008  . COLONOSCOPY WITH PROPOFOL N/A 09/13/2018   Procedure: COLONOSCOPY WITH BIOPSY;  Surgeon: [name], MD;  Location: [place];  Service: Endoscopy;  Laterality: N/A;  . CORONARY ARTERY BYPASS GRAFT N/A 11/11/2019   Procedure: CORONARY ARTERY BYPASS GRAFTING (CABG) USING LIMA to Diag1; ENDOSCOPICALLY HARVESTED RIGHT GREATER SAPHENOUS VEIN: SVG to OM1; SVG to OM2; SVG to PDA.;  Surgeon: Ivin Poot, MD;  Location: [place];  Service: Open Heart Surgery;  Laterality: N/A;  . CORONARY STENT PLACEMENT  08/07/2015   MID CIRCUMFLEX  . ENDOVEIN HARVEST OF GREATER SAPHENOUS VEIN Right 11/11/2019   Procedure: ENDOVEIN HARVEST OF GREATER SAPHENOUS VEIN;  Surgeon: Ivin Poot, MD;  Location: [place];  Service: Open Heart Surgery;  Laterality: Right;  . ESOPHAGOGASTRODUODENOSCOPY (EGD) WITH PROPOFOL N/A 02/11/2019   Procedure: ESOPHAGOGASTRODUODENOSCOPY (EGD) WITH BIOPSY and  Dilation;  Surgeon: [name], MD;  Location: [place];  Service: Endoscopy;  Laterality: N/A;  . HIP SURGERY Left 10/2016   left hip tendon repair  . LEFT HEART CATH AND CORONARY ANGIOGRAPHY N/A 11/27/2017   Procedure: LEFT HEART CATH AND CORONARY ANGIOGRAPHY;  Surgeon: Wellington Hampshire, MD;  Location: [place] [facility];  Service: Cardiovascular;  Laterality: N/A;  . LOWER EXTREMITY ANGIOGRAPHY Left 02/12/2018   Procedure: LOWER EXTREMITY ANGIOGRAPHY;  Surgeon: Algernon Huxley, MD;  Location: [place] [facility];  Service: Cardiovascular;  Laterality: Left;  . LOWER EXTREMITY ANGIOGRAPHY Left 03/07/2018   Procedure: LOWER EXTREMITY ANGIOGRAPHY;  Surgeon: Algernon Huxley, MD;  Location: [place] [facility];  Service: Cardiovascular;  Laterality: Left;  . LOWER EXTREMITY ANGIOGRAPHY Left 06/04/2018   Procedure: LOWER EXTREMITY ANGIOGRAPHY;  Surgeon: Algernon Huxley, MD;  Location: [place] [facility];  Service: Cardiovascular;  Laterality:  Left;  . PLACEMENT OF IMPELLA LEFT VENTRICULAR ASSIST DEVICE N/A 11/11/2019   Procedure: PLACEMENT OF IMPELLA LEFT VENTRICULAR ASSIST DEVICE 5.5;  Surgeon: Ivin Poot, MD;  Location: [place];  Service: Open Heart Surgery;  Laterality: N/A;  Midline Sternotomy  . POLYPECTOMY N/A 09/13/2018   Procedure: POLYPECTOMY;  Surgeon: [name], MD;  Location: [place];  Service: Endoscopy;  Laterality: N/A;  . POLYPECTOMY N/A 02/11/2019   Procedure: POLYPECTOMY;  Surgeon: [name], MD;  Location: [place];  Service: Endoscopy;  Laterality: N/A;  . REMOVAL OF IMPELLA LEFT VENTRICULAR ASSIST DEVICE N/A 11/15/2019   Procedure: REMOVAL OF IMPELLA 5.5 LEFT VENTRICULAR ASSIST DEVICE;  Surgeon: Ivin Poot, MD;  Location: [place];  Service: Open Heart Surgery;  Laterality: N/A;  . RIGHT/LEFT HEART CATH AND CORONARY ANGIOGRAPHY N/A 11/05/2019   Procedure: RIGHT/LEFT HEART CATH AND CORONARY ANGIOGRAPHY;  Surgeon: Nelva Bush, MD;  Location: [place] [facility];  Service: Cardiovascular;  Laterality: N/A;  . SPINE SURGERY    . TEE WITHOUT CARDIOVERSION N/A 11/11/2019   Procedure: TRANSESOPHAGEAL ECHOCARDIOGRAM (TEE);  Surgeon: Prescott Gum, Collier Salina, MD;  Location: [place];  Service: Open Heart Surgery;  Laterality: N/A;  . TEE WITHOUT CARDIOVERSION N/A 11/15/2019   Procedure: TRANSESOPHAGEAL ECHOCARDIOGRAM (TEE);  Surgeon: Prescott Gum, Collier Salina, MD;  Location: [place];  Service: Open  Heart Surgery;  Laterality: N/A;  . TONSILLECTOMY      Current Medications: Current Meds  Medication Sig  . Alirocumab (PRALUENT) 150 MG/ML SOAJ Inject 1 pen into the skin every 14 (fourteen) days.  Marland Kitchen apixaban (ELIQUIS) 5 MG TABS tablet Take 1 tablet (5 mg total) by mouth 2 (two) times daily.  . carvedilol (COREG) 6.25 MG tablet Take 1.5 tablets (9.375 mg total) by mouth 2 (two) times daily.  . colchicine 0.6 MG tablet Take 0.6 mg by mouth daily as needed.  . empagliflozin (JARDIANCE) 10 MG TABS tablet Take 1  tablet (10 mg total) by mouth daily before breakfast.  . FLUZONE HIGH-DOSE QUADRIVALENT 0.7 ML SUSY   . nitroGLYCERIN (NITROSTAT) 0.4 MG SL tablet Place 1 tablet (0.4 mg total) under the tongue every 5 (five) minutes as needed for chest pain.  . pantoprazole (PROTONIX) 40 MG tablet Take 1 tablet (40 mg total) by mouth daily.  . sacubitril-valsartan (ENTRESTO) 49-51 MG Take 1 tablet by mouth 2 (two) times daily.  Marland Kitchen SHINGRIX injection   . spironolactone (ALDACTONE) 25 MG tablet Take 1 tablet (25 mg total) by mouth daily.  . traMADol (ULTRAM) 50 MG tablet Take 1 tablet (50 mg total) by mouth every 6 (six) hours as needed.  . traZODone (DESYREL) 50 MG tablet TAKE 1 TABLET BY MOUTH AT BEDTIME AS NEEDED FOR SLEEP.  Marland Kitchen triamcinolone cream (KENALOG) 0.1 % Apply 1 application topically 2 (two) times daily as needed for itching.     Allergies:   Brilinta [ticagrelor], Chlorhexidine gluconate, Statins, and Zetia [ezetimibe]   Social History   Socioeconomic History  . Marital status: Married    Spouse name: Not on file  . Number of children: 1  . Years of education: Not on file  . Highest education level: Bachelor's degree (e.g., BA, AB, BS)  Occupational History  . Not on file  Tobacco Use  . Smoking status: Former Smoker    Packs/day: 1.25    Years: 35.00    Pack years: 43.75    Types: Cigarettes    Quit date: 02/28/2005    Years since quitting: 15.0  . Smokeless tobacco: Current User    Types: Snuff  . Tobacco comment: occaisionally  Vaping Use  . Vaping Use: Never used  Substance and Sexual Activity  . Alcohol use: Yes    Alcohol/week: 8.0 - 10.0 standard drinks    Types: 8 - 10 Glasses of wine per week  . Drug use: No  . Sexual activity: Yes  Other Topics Concern  . Not on file  Social History Narrative  . Not on file   Social Determinants of Health   Financial Resource Strain: Low Risk   . Difficulty of Paying Living Expenses: Not hard at all  Food Insecurity: No Food  Insecurity  . Worried About Charity fundraiser in the Last Year: Never true  . Ran Out of Food in the Last Year: Never true  Transportation Needs: No Transportation Needs  . Lack of Transportation (Medical): No  . Lack of Transportation (Non-Medical): No  Physical Activity: Sufficiently Active  . Days of Exercise per Week: 4 days  . Minutes of Exercise per Session: 60 min  Stress: No Stress Concern Present  . Feeling of Stress : Not at all  Social Connections: Moderately Integrated  . Frequency of Communication with Friends and Family: More than three times a week  . Frequency of Social Gatherings with Friends and Family: Three times a  week  . Attends Religious Services: Never  . Active Member of Clubs or Organizations: Yes  . Attends Archivist Meetings: More than 4 times per year  . Marital Status: Married     Family History: The patient's family history includes Cancer (age of onset: 44) in his father; Coronary artery disease in his mother; Diabetes in his mother; Heart attack in his father and mother; Heart disease in his father and mother; Hypertension in his father and mother; Stroke in his father. There is no history of Colon cancer, Colon polyps, Esophageal cancer, Rectal cancer, or Stomach cancer.  ROS:   Please see the history of present illness.    All other systems reviewed and are negative.  EKGs/Labs/Other Studies Reviewed:    The following studies were reviewed today: Prior notes, echo, EKG  March 02, 2020 echo personally reviewed Left ventricular function severely decreased, 30% Dyssynchrony consistent with left bundle branch block   EKG:  The ekg yesterday shows a left bundle branch block, sinus rhythm  Recent Labs: 11/07/2019: TSH 3.303 11/16/2019: Magnesium 2.0 11/18/2019: ALT 16 12/04/2019: BNP 202.7 03/02/2020: BUN 34; Creatinine, Ser 1.07; Hemoglobin 14.0; Platelets 192; Potassium 5.4; Sodium 135  Recent Lipid Panel    Component Value  Date/Time   CHOL 144 01/07/2020 1601   CHOL 144 12/10/2018 0925   TRIG 158 (H) 01/07/2020 1601   HDL 62 01/07/2020 1601   HDL 59 12/10/2018 0925   CHOLHDL 2.3 01/07/2020 1601   VLDL 32 01/07/2020 1601   LDLCALC 50 01/07/2020 1601   LDLCALC 68 12/10/2018 0925    Physical Exam:    VS:  BP 102/70   Pulse 70   Ht 5\' 6"  (1.676 m)   Wt 176 lb (79.8 kg)   SpO2 96%   BMI 28.41 kg/m     Wt Readings from Last 3 Encounters:  03/03/20 176 lb (79.8 kg)  03/02/20 178 lb (80.7 kg)  01/30/20 173 lb 3.2 oz (78.6 kg)     GEN:  Well nourished, well developed in no acute distress HEENT: Normal NECK: No JVD; No carotid bruits LYMPHATICS: No lymphadenopathy CARDIAC: RRR, no murmurs, rubs, gallops RESPIRATORY:  Clear to auscultation without rales, wheezing or rhonchi  ABDOMEN: Soft, non-tender, non-distended MUSCULOSKELETAL:  No edema; No deformity  SKIN: Warm and dry NEUROLOGIC:  Alert and oriented x 3 PSYCHIATRIC:  Normal affect   ASSESSMENT:    1. Chronic systolic heart failure ([place])   2. Nonischemic cardiomyopathy ([place])   3. LBBB (left bundle branch block)   4. PAF (paroxysmal atrial fibrillation) ([facility])    PLAN:    In order of problems listed above:  1. Chronic systolic heart failure secondary to ischemic cardiomyopathy NYHA class II symptoms, warm and dry on exam.  Left bundle-branch block.  EF 30%. On good medical therapy with carvedilol, Entresto, spironolactone, Jardiance. Discussed CRT-D implant with the patient today given his persistently low ejection fraction and left bundle branch block.  Discussed procedural details, risks and expected recovery.  He would like to proceed.  Risks, benefits, and alternatives to CRT-D were discussed in detail today.  The patient understands that risks include but are not limited to bleeding, infection, pneumothorax, perforation, tamponade, vascular damage, renal failure, MI, stroke, death, inappropriate shocks and lead dislodgement and  wishes to proceed.  We will therefore schedule the procedure at the next available time.  2.  Atrial fibrillation Maintaining normal rhythm off amiodarone and digoxin.  Continue Eliquis twice daily.  We will  need to stop this 2 days prior to the CRT-D implant and we will plan to restart it 5 days postop.    Medication Adjustments/Labs and Tests Ordered: Current medicines are reviewed at length with the patient today.  Concerns regarding medicines are outlined above.  No orders of the defined types were placed in this encounter.  No orders of the defined types were placed in this encounter.    Signed, Steffanie Dunn, MD, [facility]  03/03/2020 10:12 AM    Electrophysiology [facility]

## 2020-03-04 ENCOUNTER — Telehealth: Payer: Self-pay | Admitting: *Deleted

## 2020-03-04 NOTE — Telephone Encounter (Signed)
Discussed lung screening scan with patient. Will follow up in May due to patient's recent cardiac surgery and planned additional surgery soon.

## 2020-03-04 NOTE — Telephone Encounter (Signed)
Attempted phone call to pt to review device instruction letter with pt.  See letter for complete details.  Per Epic ok to leave detailed voicemail message.  Pt advised instructions have been released to his MyChart.  Pt may contact Juliette Mangle, Dr Lambert's nurse with any further questions or concerns.  Hard copy of letter mailed to pt's address on file.

## 2020-03-10 DIAGNOSIS — M76892 Other specified enthesopathies of left lower limb, excluding foot: Secondary | ICD-10-CM | POA: Diagnosis not present

## 2020-03-11 NOTE — Telephone Encounter (Signed)
Sent in application via fax.  Will follow up.  

## 2020-03-11 NOTE — Progress Notes (Incomplete)
PCP: Dr Army Melia  HF Cardiology: Dr [name]  Cardiology: Dr End  CT Surgeon: Dr Prescott Gum  HPI:  [name] is a 70 y.o. with a history of CAD with NSTEMI 2017 s/p PCI to LCX , LBBB, HTN,PAD, RCEA, left femoral stent (Kreischer term clopidogrel),and hyperlipidemia.  Had ECHO 2019 that showed EF 40-45%.   Presented to [facility] 9/21 for outpatient heart cath. Cath showed left main disease.ECHO completed and showed EF 25-30% with normal RV.Transferred to [facility] for CT surgery consultation.Deemed appropriate for CABG. S/P CABGLIMA-LAD, SVG-OM1, SVG-OM2 and SVG-PDA. Post-op, he required Impella 5.5. Had atrial fibrillation. Placed on IV amiodarone and transitioned to po amiodarone. Diuresed with IV furosemide and transitioned to furosemide 40 mg daily. Now using furosemide only prn.   Echo done 03/03/19 and reviewed, EF 30-35% with diffuse hypokinesis and septal-lateral dyssynchrony consistent with LBBB, mildly decreased RV systolic function.   Recently returned to HF Clinic for followup of CHF and CAD on 03/03/19. He was in NSR, no longer taking amiodarone. He was off digoxin with elevated level and nausea.  No chest pain.  He reported going to the gym where he rides the exercise bike and lifts weights 3 times/week.  He golfs 3-4 days/week.  No dyspnea except with heavy exertion.  No chest pain.  No orthopnea/PND.  Weight was up about 6 lbs since last appointment.  Today he returns to HF clinic for pharmacist medication titration. At last visit with MD, carvedilol was increased to 9.375 mg BID and Jardiance 10 mg daily was initiated (farxiga was non-formulary).    HF Medications: Carvedilol 9.375 mg BID Entresto 49/51 mg BID Spironolactone 25 mg daily Jardiance 10 mg daily Furosemide 40 mg PRN Potassium chloride 20 mEq PRN  Has the patient been experiencing any side effects to the medications prescribed?  no  Does the patient have any problems obtaining medications due to transportation or  finances?   Yes - has [employer] Medicare plan. [name] through [employer].   Understanding of regimen: good Understanding of indications: good Potential of compliance: good Patient understands to avoid NSAIDs. Patient understands to avoid decongestants.    Pertinent Lab Values (01/07/20): [name] Kitchen Serum creatinine 0.89, BUN 21, Potassium 4.2, Sodium 139   Vital Signs: . Weight: 173.2 lbs (last clinic weight: 172.8 lbs) . Blood pressure: 120/76  . Heart rate: 64   Assessment: 1. CAD: H/o PCI to LCX in 2017. LHC 9/7/21with multivessel CAD, including calcified 80% distal LM stenosis, small diffusely diseased distal LAD, chronically occluded OM1, and 50% mid RCA lesion. He is now s/p LIMA-LAD, SVG-OM1, SVG-OM2 and SVG-PDA. No chest pain.  - Intolerant of statins, he is on Praluent now. Good lipids in 11/21. - No ASA given apixaban use.  2. Chronic systolic CHF: Ischemic cardiomyopathy. Echo in 9/21 with EF 25-30%.  Echo 03/03/19 showed EF 30-35% with diffuse hypokinesis and septal-lateral dyssynchrony consistent with LBBB, mildly decreased RV systolic function.   - NYHA II. He is not volume overloaded.  - Continue furosemide 40 mg PRN - Continue carvedilol 9.375 mg BID - Continue Entresto 49/51 mg BID - Continue spironolactone 25 mg daily  - Continue Jardiance 10 mg daily    - EF is persistently low.  He has LBBB, not markedly wide but he has prominent septal-lateral dyssynchrony on echo.  Needs CRT-D implant, referred to EP.  3. Atrial fibrillation: Paroxysmal.  Noted peri-op.  He is now off amiodarone.  - Continue apixaban.    Plan: 1) Medication changes:  Based on clinical presentation, vital signs and recent labs will *** 2) Follow-up: 1 month with Dr. Rush Farmer, PharmD, BCPS, [facility], [place] [facility] Pharmacist [phone]

## 2020-03-12 ENCOUNTER — Other Ambulatory Visit: Payer: Self-pay | Admitting: Gastroenterology

## 2020-03-13 ENCOUNTER — Other Ambulatory Visit: Payer: Self-pay | Admitting: Cardiology

## 2020-03-13 DIAGNOSIS — I5022 Chronic systolic (congestive) heart failure: Secondary | ICD-10-CM | POA: Diagnosis not present

## 2020-03-14 LAB — BASIC METABOLIC PANEL
BUN/Creatinine Ratio: 38 — ABNORMAL HIGH (ref 10–24)
BUN: 50 mg/dL — ABNORMAL HIGH (ref 8–27)
CO2: 21 mmol/L (ref 20–29)
Calcium: 9.7 mg/dL (ref 8.6–10.2)
Chloride: 99 mmol/L (ref 96–106)
Creatinine, Ser: 1.32 mg/dL — ABNORMAL HIGH (ref 0.76–1.27)
GFR calc Af Amer: 63 mL/min/{1.73_m2} (ref >59)
GFR calc non Af Amer: 54 mL/min/{1.73_m2} — ABNORMAL LOW (ref >59)
Glucose: 108 mg/dL — ABNORMAL HIGH (ref 65–99)
Potassium: 5.8 mmol/L — ABNORMAL HIGH (ref 3.5–5.2)
Sodium: 138 mmol/L (ref 134–144)

## 2020-03-18 NOTE — Progress Notes (Signed)
Follow-up Outpatient Visit Date: 03/19/2020  Primary Care Provider: Glean Hess, MD [address] [place] [address]  Chief Complaint: Follow-up CAD and heart failure  HPI:  Mr. Raptis is a 70 y.o. male with history of CAD status post PCI to LCx in 2017 in setting of NSTEMI and subsequent development of severe distal LMCA disease status post CABG in 10/2019, chronic HFrEF (mixed ischemic and nonischemic), paroxysmal atrial fibrillation, PAD, HTN, HLD, and carotid artery stenosis status post right carotid endarterectomy, who presents for follow-up of coronary artery disease and heart failure.  Since I last saw him, he has been followed closely by the advanced heart failure clinic (Dr. [name]) and EP with plans for CRT implantation next week with Dr. Quentin Ore.  Today, Mr. Oestreicher feels fairly well.  His strength has improved and he is back to playing golf.  Chest wall soreness following CABG has almost completely resolved.  He has minimal exertional dyspnea and also denies palpitations, lightheadedness, and edema.  He is concerned that some of his heart failure medications are making him feel worse than before his CABG.  He also wishes to follow with [place] primarily for management of his cardiac issues, rather than the heart failure clinic in [place].  Of note, recent labs by Dr. Claris Gladden office were notable for moderate hyperkalemia.  Several medication changes were recommended, though these have yet to be communicated to Mr. Fitzpatrick.  --------------------------------------------------------------------------------------------------  Past Medical History:  Diagnosis Date  . Abnormal nuclear cardiac imaging test 08/08/2015  . Arthritis    fingers  . Carotid artery occlusion   . CHF (congestive heart failure) ([place])   . Coronary atherosclerosis of native coronary artery 01/29/2013   11/05/19 R/LHC 80% dLMCA stenosis small diffusely dz dLAD, chronically occluded OM1 50%mRCA lesion, widely  patent mLCx strent, moderately elevated L heart filling pressures, mild to moderate RH filling pressures, normal to moderately reduced CO  . Duodenal erosion   . Encounter for screening for lung cancer 07/13/2016  . Esophageal stenosis    esophageal dilation  . GERD (gastroesophageal reflux disease)   . H. pylori infection   . Heart attack [facility]) Oct. 2009   Mild  . Hiatal hernia   . Hyperlipidemia   . Hypertension   . 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 Cardiac catheterization-nonobstructive 60% RCA disease-subsequent nuclear stress test-9 minutes, low risk, mild inferior wall hypokinesis   . Pain in limb 12/19/2017  . Peripheral vascular disease ([place])   . Unstable angina ([place]) 11/25/2017   Past Surgical History:  Procedure Laterality Date  . APPENDECTOMY    . BACK SURGERY    . CARDIAC CATHETERIZATION N/A 08/07/2015   Procedure: Left Heart Cath and Coronary Angiography;  Surgeon: [name], MD;  Location: [place] [facility];  Service: Cardiovascular;  Laterality: N/A;  . CARDIAC CATHETERIZATION N/A 08/07/2015   Procedure: Coronary Stent Intervention;  Surgeon: [name], MD;  Location: [place] [facility];  Service: Cardiovascular;  Laterality: N/A;  . CARDIAC CATHETERIZATION N/A 08/07/2015   Procedure: Coronary Stent Intervention;  Surgeon: [name], MD;  Location: [place] [facility];  Service: Cardiovascular;  Laterality: N/A;  . CAROTID ENDARTERECTOMY  01/05/2006   Right  CEA with DPA  . CATARACT EXTRACTION W/ INTRAOCULAR LENS IMPLANT Left 12/04/2017  . CATARACT EXTRACTION W/PHACO Left 12/04/2017   Procedure: CATARACT EXTRACTION PHACO AND INTRAOCULAR LENS PLACEMENT ([place]) LEFT;  Surgeon: Benay Pillow  Elta Guadeloupe, MD;  Location: [place];  Service: Ophthalmology;  Laterality: Left;  . CATARACT EXTRACTION W/PHACO Right 02/06/2018   Procedure: CATARACT EXTRACTION PHACO AND INTRAOCULAR LENS PLACEMENT (I[facility])RIGHT;   Surgeon: Eulogio Bear, MD;  Location: [place];  Service: Ophthalmology;  Laterality: Right;  . COLONOSCOPY  05/20/2008  . COLONOSCOPY WITH PROPOFOL N/A 09/13/2018   Procedure: COLONOSCOPY WITH BIOPSY;  Surgeon: [name], MD;  Location: [place];  Service: Endoscopy;  Laterality: N/A;  . CORONARY ARTERY BYPASS GRAFT N/A 11/11/2019   Procedure: CORONARY ARTERY BYPASS GRAFTING (CABG) USING LIMA to Diag1; ENDOSCOPICALLY HARVESTED RIGHT GREATER SAPHENOUS VEIN: SVG to OM1; SVG to OM2; SVG to PDA.;  Surgeon: Ivin Poot, MD;  Location: [place];  Service: Open Heart Surgery;  Laterality: N/A;  . CORONARY STENT PLACEMENT  08/07/2015   MID CIRCUMFLEX  . ENDOVEIN HARVEST OF GREATER SAPHENOUS VEIN Right 11/11/2019   Procedure: ENDOVEIN HARVEST OF GREATER SAPHENOUS VEIN;  Surgeon: Ivin Poot, MD;  Location: [place];  Service: Open Heart Surgery;  Laterality: Right;  . ESOPHAGOGASTRODUODENOSCOPY (EGD) WITH PROPOFOL N/A 02/11/2019   Procedure: ESOPHAGOGASTRODUODENOSCOPY (EGD) WITH BIOPSY and  Dilation;  Surgeon: [name], MD;  Location: [place];  Service: Endoscopy;  Laterality: N/A;  . HIP SURGERY Left 10/2016   left hip tendon repair  . LEFT HEART CATH AND CORONARY ANGIOGRAPHY N/A 11/27/2017   Procedure: LEFT HEART CATH AND CORONARY ANGIOGRAPHY;  Surgeon: Wellington Hampshire, MD;  Location: [place] [facility];  Service: Cardiovascular;  Laterality: N/A;  . LOWER EXTREMITY ANGIOGRAPHY Left 02/12/2018   Procedure: LOWER EXTREMITY ANGIOGRAPHY;  Surgeon: Algernon Huxley, MD;  Location: [place] [facility];  Service: Cardiovascular;  Laterality: Left;  . LOWER EXTREMITY ANGIOGRAPHY Left 03/07/2018   Procedure: LOWER EXTREMITY ANGIOGRAPHY;  Surgeon: Algernon Huxley, MD;  Location: [place] [facility];  Service: Cardiovascular;  Laterality: Left;  . LOWER EXTREMITY ANGIOGRAPHY Left 06/04/2018   Procedure: LOWER EXTREMITY ANGIOGRAPHY;  Surgeon: Algernon Huxley, MD;  Location:  [place] [facility];  Service: Cardiovascular;  Laterality: Left;  . PLACEMENT OF IMPELLA LEFT VENTRICULAR ASSIST DEVICE N/A 11/11/2019   Procedure: PLACEMENT OF IMPELLA LEFT VENTRICULAR ASSIST DEVICE 5.5;  Surgeon: Ivin Poot, MD;  Location: [place];  Service: Open Heart Surgery;  Laterality: N/A;  Midline Sternotomy  . POLYPECTOMY N/A 09/13/2018   Procedure: POLYPECTOMY;  Surgeon: [name], MD;  Location: [place];  Service: Endoscopy;  Laterality: N/A;  . POLYPECTOMY N/A 02/11/2019   Procedure: POLYPECTOMY;  Surgeon: [name], MD;  Location: [place];  Service: Endoscopy;  Laterality: N/A;  . REMOVAL OF IMPELLA LEFT VENTRICULAR ASSIST DEVICE N/A 11/15/2019   Procedure: REMOVAL OF IMPELLA 5.5 LEFT VENTRICULAR ASSIST DEVICE;  Surgeon: Ivin Poot, MD;  Location: [place];  Service: Open Heart Surgery;  Laterality: N/A;  . RIGHT/LEFT HEART CATH AND CORONARY ANGIOGRAPHY N/A 11/05/2019   Procedure: RIGHT/LEFT HEART CATH AND CORONARY ANGIOGRAPHY;  Surgeon: Nelva Bush, MD;  Location: [place] [facility];  Service: Cardiovascular;  Laterality: N/A;  . SPINE SURGERY    . TEE WITHOUT CARDIOVERSION N/A 11/11/2019   Procedure: TRANSESOPHAGEAL ECHOCARDIOGRAM (TEE);  Surgeon: Prescott Gum, Collier Salina, MD;  Location: [place];  Service: Open Heart Surgery;  Laterality: N/A;  . TEE WITHOUT CARDIOVERSION N/A 11/15/2019   Procedure: TRANSESOPHAGEAL ECHOCARDIOGRAM (TEE);  Surgeon: Prescott Gum, Collier Salina, MD;  Location: [place];  Service: Open Heart Surgery;  Laterality: N/A;  .  TONSILLECTOMY      Current Meds  Medication Sig  . Alirocumab (PRALUENT) 150 MG/ML SOAJ Inject 1 pen into the skin every 14 (fourteen) days. (Patient taking differently: Inject 150 mg into the skin every 14 (fourteen) days.)  . apixaban (ELIQUIS) 5 MG TABS tablet Take 1 tablet (5 mg total) by mouth 2 (two) times daily.  . carvedilol (COREG) 6.25 MG tablet Take 1.5 tablets (9.375 mg total) by mouth 2 (two) times daily.   . colchicine 0.6 MG tablet Take 0.6 mg by mouth daily as needed (Gout).  Marland Kitchen empagliflozin (JARDIANCE) 10 MG TABS tablet Take 1 tablet (10 mg total) by mouth daily before breakfast.  . furosemide (LASIX) 20 MG tablet Take 20 mg by mouth daily as needed for fluid.  . nitroGLYCERIN (NITROSTAT) 0.4 MG SL tablet Place 1 tablet (0.4 mg total) under the tongue every 5 (five) minutes as needed for chest pain.  . pantoprazole (PROTONIX) 40 MG tablet Take 1 tablet (40 mg total) by mouth daily. **PLEASE SCHEDULE FOLLOW UP APPT** (Patient taking differently: Take 40 mg by mouth daily.)  . potassium chloride (KLOR-CON) 20 MEQ packet Take 20 mEq by mouth daily as needed (When take lasix).  . sacubitril-valsartan (ENTRESTO) 49-51 MG Take 1 tablet by mouth 2 (two) times daily.  Marland Kitchen spironolactone (ALDACTONE) 25 MG tablet Take 1 tablet (25 mg total) by mouth daily.  . traMADol (ULTRAM) 50 MG tablet Take 1 tablet (50 mg total) by mouth every 6 (six) hours as needed. (Patient taking differently: Take 50 mg by mouth every 6 (six) hours as needed for moderate pain or severe pain.)  . traZODone (DESYREL) 50 MG tablet TAKE 1 TABLET BY MOUTH AT BEDTIME AS NEEDED FOR SLEEP. (Patient taking differently: Take 50 mg by mouth at bedtime as needed for sleep.)  . triamcinolone cream (KENALOG) 0.1 % Apply 1 application topically 2 (two) times daily as needed for itching.    Allergies: Brilinta [ticagrelor], Chlorhexidine gluconate, Statins, and Zetia [ezetimibe]  Social History   Tobacco Use  . Smoking status: Former Smoker    Packs/day: 1.25    Years: 35.00    Pack years: 43.75    Types: Cigarettes    Quit date: 02/28/2005    Years since quitting: 15.0  . Smokeless tobacco: Current User    Types: Snuff  . Tobacco comment: occaisionally  Vaping Use  . Vaping Use: Never used  Substance Use Topics  . Alcohol use: Yes    Alcohol/week: 8.0 - 10.0 standard drinks    Types: 8 - 10 Glasses of wine per week  . Drug use: No     Family History  Problem Relation Age of Onset  . Heart attack Mother   . Coronary artery disease Mother   . Heart disease Mother        Carotid Stenosis and BPG and Heart Disease before age 46  . Diabetes Mother   . Hypertension Mother   . Heart attack Father   . Heart disease Father        BPG and Heart Disease before age 53  . Hypertension Father   . Cancer Father 55       throat  . Stroke Father   . Colon cancer Neg Hx   . Colon polyps Neg Hx   . Esophageal cancer Neg Hx   . Rectal cancer Neg Hx   . Stomach cancer Neg Hx     Review of Systems: A 12-system review of systems was  performed and was negative except as noted in the HPI.  --------------------------------------------------------------------------------------------------  Physical Exam: BP 102/68 (BP Location: Left Arm, Patient Position: Sitting, Cuff Size: Normal)   Pulse 62   Ht 5\' 6"  (1.676 m)   Wt 177 lb (80.3 kg)   SpO2 97%   BMI 28.57 kg/m   General:  NAD. Neck: No JVD or HJR. Lungs: Clear to auscultation bilaterally without wheezes or crackles. Heart: Regular rate and rhythm without murmurs, rubs, or gallops. Abdomen: Soft, nontender, nondistended. Extremities: No lower extremity edema.  EKG (03/02/2020):  NSR with LBBB.  Lab Results  Component Value Date   WBC 4.1 03/02/2020   HGB 14.0 03/02/2020   HCT 44.6 03/02/2020   MCV 91.0 03/02/2020   PLT 192 03/02/2020    Lab Results  Component Value Date   NA 138 03/13/2020   K 5.8 (H) 03/13/2020   CL 99 03/13/2020   CO2 21 03/13/2020   BUN 50 (H) 03/13/2020   CREATININE 1.32 (H) 03/13/2020   GLUCOSE 108 (H) 03/13/2020   ALT 16 11/18/2019    Lab Results  Component Value Date   CHOL 144 01/07/2020   HDL 62 01/07/2020   LDLCALC 50 01/07/2020   TRIG 158 (H) 01/07/2020   CHOLHDL 2.3 01/07/2020    --------------------------------------------------------------------------------------------------  ASSESSMENT AND PLAN: Coronary  artery disease: No angina reported with good recovery from CABG in 10/2019.  Continue aspirin and carvedilol for secondary prevention, as well as alirocumab.  Chronic HFrEF and hyperkalemia: Mr. Heaster appears euvolemic with NYHA class II symptoms.  He is scheduled for CRT-D in the setting of LBBB and persistent LVEF < 35%.  I will repeat labs today, given recent hyperkalemia.  In the meantime, I have instructed Mr. Afonso to stop spironolactone.  Further medication changes to be made based on results of repeat BMP.  I will reach out to Dr. [name] to see if the HF pharmacy team can meet with Mr. Borenstein when he is hospitalized for device placement next week, rather than [facility] a [facility] visit.  Hyperlipidemia: Continue alirocumab with ongoing follow-up through the lipid clinic.  PAF: Maintaining sinus rhythm on most recent EKG and by exam today.  Continue apixaban and carvedilol.  Follow-up: Return to clinic in 1 month.  Nelva Bush, MD 03/19/2020 10:50 AM

## 2020-03-19 ENCOUNTER — Encounter: Payer: Self-pay | Admitting: Internal Medicine

## 2020-03-19 ENCOUNTER — Other Ambulatory Visit
Admission: RE | Admit: 2020-03-19 | Discharge: 2020-03-19 | Disposition: A | Discharge status: Home / Self Care | Payer: PPO | Source: Ambulatory Visit | Attending: Internal Medicine | Admitting: Internal Medicine

## 2020-03-19 ENCOUNTER — Ambulatory Visit: Payer: PPO | Admitting: Internal Medicine

## 2020-03-19 ENCOUNTER — Telehealth: Payer: Self-pay | Admitting: Internal Medicine

## 2020-03-19 ENCOUNTER — Other Ambulatory Visit: Payer: Self-pay

## 2020-03-19 VITALS — BP 102/68 | HR 62 | Ht 66.0 in | Wt 177.0 lb

## 2020-03-19 DIAGNOSIS — Z79899 Other long term (current) drug therapy: Secondary | ICD-10-CM

## 2020-03-19 DIAGNOSIS — I5022 Chronic systolic (congestive) heart failure: Secondary | ICD-10-CM | POA: Insufficient documentation

## 2020-03-19 DIAGNOSIS — E785 Hyperlipidemia, unspecified: Secondary | ICD-10-CM | POA: Diagnosis not present

## 2020-03-19 DIAGNOSIS — E875 Hyperkalemia: Secondary | ICD-10-CM

## 2020-03-19 DIAGNOSIS — Z20822 Contact with and (suspected) exposure to covid-19: Secondary | ICD-10-CM | POA: Diagnosis not present

## 2020-03-19 DIAGNOSIS — Z01812 Encounter for preprocedural laboratory examination: Secondary | ICD-10-CM | POA: Diagnosis not present

## 2020-03-19 DIAGNOSIS — I2510 Atherosclerotic heart disease of native coronary artery without angina pectoris: Secondary | ICD-10-CM

## 2020-03-19 LAB — BASIC METABOLIC PANEL
Anion gap: 9 (ref 5–15)
BUN: 47 mg/dL — ABNORMAL HIGH (ref 8–23)
CO2: 25 mmol/L (ref 22–32)
Calcium: 9 mg/dL (ref 8.9–10.3)
Chloride: 104 mmol/L (ref 98–111)
Creatinine, Ser: 1.28 mg/dL — ABNORMAL HIGH (ref 0.61–1.24)
GFR, Estimated: >60 mL/min (ref >60)
Glucose, Bld: 106 mg/dL — ABNORMAL HIGH (ref 70–99)
Potassium: 5.9 mmol/L — ABNORMAL HIGH (ref 3.5–5.1)
Sodium: 138 mmol/L (ref 135–145)

## 2020-03-19 LAB — SARS CORONAVIRUS 2 (TAT 6-24 HRS): SARS Coronavirus 2: NEGATIVE

## 2020-03-19 MED ORDER — VELTASSA 8.4 G PO PACK: 8.4000 g | PACK | Freq: Every day | ORAL | 0 refills | Status: DC

## 2020-03-19 NOTE — Telephone Encounter (Signed)
Pharmacy sent request to change medication. Veltassa is not on the pt formulary.

## 2020-03-19 NOTE — Telephone Encounter (Signed)
Nelva Bush, MD  03/19/2020 12:13 PM EST Back to Top     Please let Mr. Barbeau know that his kidney function and potassium are stable, with potassium still somewhat elevated at 5.9. I recommend the following:  Stop spironolactone Hold this evening's dose of Entresto Take Veltassa 8.4 gm x 1 today Repeat BMP when he presents for device implantation on Monday. No Lasix or potassium.

## 2020-03-19 NOTE — Addendum Note (Signed)
Addended by: Annia Belt on: 03/19/2020 12:44 PM   Modules accepted: Orders

## 2020-03-19 NOTE — Telephone Encounter (Signed)
Patient believes Dr. Saunders Revel will be taking care of him from now on and wants to confirm he can cancel HF clinic visit with [facility].    Please call.

## 2020-03-19 NOTE — Patient Instructions (Addendum)
Medication Instructions:  Your physician has recommended you make the following change in your medication:  1- STOP Spironolactone.  *If you need a refill on your cardiac medications before your next appointment, please call your pharmacy*   Lab Work: TODAY - STAT BMET at the [facility] as you leave today.  If you have labs (blood work) drawn today and your tests are completely normal, you will receive your results only by: Marland Kitchen MyChart Message (if you have MyChart) OR . A paper copy in the mail If you have any lab test that is abnormal or we need to change your treatment, we will call you to review the results.   Testing/Procedures: none  Follow-Up: At [facility], you and your health needs are our priority.  As part of our continuing mission to provide you with exceptional heart care, we have created designated Provider Care Teams.  These Care Teams include your primary Cardiologist (physician) and Advanced Practice Providers (APPs -  Physician Assistants and Nurse Practitioners) who all work together to provide you with the care you need, when you need it.  We recommend signing up for the patient portal called "MyChart".  Sign up information is provided on this After Visit Summary.  MyChart is used to connect with patients for Virtual Visits (Telemedicine).  Patients are able to view lab/test results, encounter notes, upcoming appointments, etc.  Non-urgent messages can be sent to your provider as well.   To learn more about what you can do with MyChart, go to [email or web address].    Your next appointment:   1 month(s) - has seen Gilford Rile and Mickle Plumb in the past as well.   The format for your next appointment:   In Person  Provider:   You may see Nelva Bush, MD or one of the following Advanced Practice Providers on your designated Care Team:    Murray Hodgkins, NP  Christell Faith, PA-C  Marrianne Mood, PA-C  Cadence Suring, [place]  Laurann [place], NP

## 2020-03-19 NOTE — Telephone Encounter (Addendum)
Called patient.  He verbalized understanding of the results and plan of care as listed by Dr End. He wrote down the recommendations and read them back to me correctly. Rx sent to pharmacy.  I had asked Dr End about the other entry concerning the cancelling of appointment with heart failure clinic in [place]. Advised patient that Dr End will be in touch with Dr [name] and discuss further with patient at another time.  Patient will let us know if he has any further questions.

## 2020-03-19 NOTE — Telephone Encounter (Signed)
Pharmacist said that the [name] is not approved by insurance and would cost $1100.  Dr End advised we can replace it with Lokelma 10 gm x1 today. Pharmacist said that should be covered.  New Rx sent to pharmacy.  Dr End would also like patient to get repeat BMET tomorrow.  Patient verbalized understanding of the medication change. He asked if he could go to the [facility] in [place] instead of driving all the way to [facility] due to the inclement weather.  Advised that would be fine.  He will call [facility] in [place] tomorrow to make sure they have the order and call us if they did not receive it.

## 2020-03-20 ENCOUNTER — Telehealth (HOSPITAL_COMMUNITY): Payer: Self-pay

## 2020-03-20 ENCOUNTER — Inpatient Hospital Stay: Admission: RE | Admit: 2020-03-20 | Payer: PPO | Source: Ambulatory Visit

## 2020-03-20 ENCOUNTER — Telehealth: Payer: Self-pay | Admitting: Internal Medicine

## 2020-03-20 DIAGNOSIS — I5022 Chronic systolic (congestive) heart failure: Secondary | ICD-10-CM

## 2020-03-20 MED ORDER — SPIRONOLACTONE 25 MG PO TABS: 12.5000 mg | ORAL_TABLET | Freq: Every day | ORAL | 3 refills | Status: DC

## 2020-03-20 NOTE — Progress Notes (Signed)
Instructed patient on the following items: Arrival time 1030 Nothing to eat or drink after midnight No meds AM of procedure Responsible person to drive you home and stay with you for 24 hrs Wash with special soap night before and morning of procedure If on anti-coagulant drug instructions Eliquis-last dose

## 2020-03-20 NOTE — Telephone Encounter (Signed)
Patient wants to change lab orders to [facility] outpatient and is going on Saturday 03/21/20

## 2020-03-20 NOTE — Telephone Encounter (Signed)
Patient verbalized understanding of Dr Darnelle Bos Recommendations.

## 2020-03-20 NOTE — Telephone Encounter (Signed)
-----   Message from Larey Dresser, MD sent at 03/16/2020 10:34 PM EST ----- 1. Stop spironolactone for 2 days, decrease to 12.5 mg daily after that.  2.  Hold Entresto for a day.  3. Increase po hydration.  4. Would give 1 dose of Veltassa 8.4 g. 5. BMET on Thursday.

## 2020-03-20 NOTE — Telephone Encounter (Signed)
Patient advised via vm per patient request,med list updated to reflect changes, lab order entered, patient has pending appointment next week will check blood work at that time.   Orders Placed This Encounter  Procedures   Basic Metabolic Panel (BMET)    Standing Status:   Future    Standing Expiration Date:   03/20/2021    Order Specific Question:   Release to patient    Answer:   Immediate     Meds ordered this encounter  Medications   spironolactone (ALDACTONE) 25 MG tablet    Sig: Take 0.5 tablets (12.5 mg total) by mouth daily.    Dispense:  45 tablet    Refill:  3    Please cancel all previous orders for current medication. Change in dosage or pill size.

## 2020-03-20 NOTE — Telephone Encounter (Signed)
Since he was just able to take [facility], let's have him take the dose this AM.  He should continue to hold spironolactone until further notice.  Let's also have him skip Entresto today.  He can have a BMP checked at the [facility] tomorrow if they are open.  I will monitor my in basket for the results over the weekend.  Thanks.  Gerald Stabs

## 2020-03-20 NOTE — Telephone Encounter (Signed)
Spoke with patient. He wasn't able to take the [facility] until this morning.  He wants to give the medicine a little more time to work. He would like to come to the [facility] tomorrow for the BMET instead of today.  Advised I will make Dr End aware and if he feels he needs to come on for labs later today, I will call him back.  Patient is concerned about the weather later this afternoon.

## 2020-03-21 ENCOUNTER — Other Ambulatory Visit
Admission: RE | Admit: 2020-03-21 | Discharge: 2020-03-21 | Disposition: A | Discharge status: Home / Self Care | Payer: PPO | Attending: Internal Medicine | Admitting: Internal Medicine

## 2020-03-21 ENCOUNTER — Telehealth: Payer: Self-pay | Admitting: Internal Medicine

## 2020-03-21 ENCOUNTER — Other Ambulatory Visit: Payer: Self-pay

## 2020-03-21 DIAGNOSIS — E875 Hyperkalemia: Secondary | ICD-10-CM | POA: Diagnosis not present

## 2020-03-21 DIAGNOSIS — Z79899 Other long term (current) drug therapy: Secondary | ICD-10-CM

## 2020-03-21 DIAGNOSIS — I5022 Chronic systolic (congestive) heart failure: Secondary | ICD-10-CM | POA: Insufficient documentation

## 2020-03-21 LAB — BASIC METABOLIC PANEL
Anion gap: 9 (ref 5–15)
BUN: 38 mg/dL — ABNORMAL HIGH (ref 8–23)
CO2: 26 mmol/L (ref 22–32)
Calcium: 8.7 mg/dL — ABNORMAL LOW (ref 8.9–10.3)
Chloride: 102 mmol/L (ref 98–111)
Creatinine, Ser: 1.47 mg/dL — ABNORMAL HIGH (ref 0.61–1.24)
GFR, Estimated: 51 mL/min — ABNORMAL LOW (ref >60)
Glucose, Bld: 109 mg/dL — ABNORMAL HIGH (ref 70–99)
Potassium: 4.7 mmol/L (ref 3.5–5.1)
Sodium: 137 mmol/L (ref 135–145)

## 2020-03-21 NOTE — Telephone Encounter (Signed)
Potassium back to normal range.  Creatinine up slightly above baseline.  I have spoke with [name] by phone and have asked him to stay off spironolactone, potassium, and furosemide.  He can restart his usual dose of Entresto tonight.  I encouraged him to drink extra water.  He can proceed with CRT-D placement as scheduled in 2 days.  BMP should be repeated at that time or on Tuesday prior to discharge home.  Nelva Bush, MD [facility]

## 2020-03-23 ENCOUNTER — Ambulatory Visit (HOSPITAL_COMMUNITY): Payer: PPO

## 2020-03-23 ENCOUNTER — Ambulatory Visit (HOSPITAL_COMMUNITY)
Admission: RE | Disposition: A | Discharge status: Home / Self Care | Payer: Self-pay | Source: Home / Self Care | Attending: Cardiology

## 2020-03-23 ENCOUNTER — Other Ambulatory Visit: Payer: Self-pay

## 2020-03-23 ENCOUNTER — Ambulatory Visit (HOSPITAL_COMMUNITY)
Admission: RE | Admit: 2020-03-23 | Discharge: 2020-03-23 | Disposition: A | Discharge status: Home / Self Care | Payer: PPO | Attending: Cardiology | Admitting: Cardiology

## 2020-03-23 DIAGNOSIS — I5022 Chronic systolic (congestive) heart failure: Secondary | ICD-10-CM | POA: Diagnosis not present

## 2020-03-23 DIAGNOSIS — Z79899 Other long term (current) drug therapy: Secondary | ICD-10-CM | POA: Insufficient documentation

## 2020-03-23 DIAGNOSIS — I480 Paroxysmal atrial fibrillation: Secondary | ICD-10-CM | POA: Insufficient documentation

## 2020-03-23 DIAGNOSIS — I255 Ischemic cardiomyopathy: Secondary | ICD-10-CM | POA: Diagnosis not present

## 2020-03-23 DIAGNOSIS — Z7901 Long term (current) use of anticoagulants: Secondary | ICD-10-CM | POA: Insufficient documentation

## 2020-03-23 DIAGNOSIS — Z95810 Presence of automatic (implantable) cardiac defibrillator: Secondary | ICD-10-CM

## 2020-03-23 DIAGNOSIS — Z951 Presence of aortocoronary bypass graft: Secondary | ICD-10-CM | POA: Diagnosis not present

## 2020-03-23 DIAGNOSIS — I110 Hypertensive heart disease with heart failure: Secondary | ICD-10-CM | POA: Diagnosis not present

## 2020-03-23 DIAGNOSIS — I447 Left bundle-branch block, unspecified: Secondary | ICD-10-CM | POA: Insufficient documentation

## 2020-03-23 DIAGNOSIS — F17290 Nicotine dependence, other tobacco product, uncomplicated: Secondary | ICD-10-CM | POA: Insufficient documentation

## 2020-03-23 HISTORY — PX: BIV ICD INSERTION CRT-D: EP1195

## 2020-03-23 SURGERY — BIV ICD INSERTION CRT-D
Anesthesia: LOCAL

## 2020-03-23 MED ORDER — FENTANYL CITRATE (PF) 100 MCG/2ML IJ SOLN
INTRAMUSCULAR | Status: DC | PRN
  Administered 2020-03-23: 12.5 ug via INTRAVENOUS
  Administered 2020-03-23 (×3): 25 ug via INTRAVENOUS

## 2020-03-23 MED ORDER — HEPARIN (PORCINE) IN NACL 1000-0.9 UT/500ML-% IV SOLN
INTRAVENOUS | Status: AC
  Filled 2020-03-23: qty 500

## 2020-03-23 MED ORDER — LIDOCAINE HCL 1 % IJ SOLN
INTRAMUSCULAR | Status: AC
  Filled 2020-03-23: qty 20

## 2020-03-23 MED ORDER — SODIUM CHLORIDE 0.9 % IV SOLN: INTRAVENOUS | Status: DC

## 2020-03-23 MED ORDER — APIXABAN 5 MG PO TABS: 5.0000 mg | ORAL_TABLET | Freq: Two times a day (BID) | ORAL | 3 refills | Status: DC

## 2020-03-23 MED ORDER — LIDOCAINE HCL (PF) 1 % IJ SOLN
INTRAMUSCULAR | Status: DC | PRN
  Administered 2020-03-23: 60 mL

## 2020-03-23 MED ORDER — FENTANYL CITRATE (PF) 100 MCG/2ML IJ SOLN
INTRAMUSCULAR | Status: AC
  Filled 2020-03-23: qty 2

## 2020-03-23 MED ORDER — ONDANSETRON HCL 4 MG/2ML IJ SOLN: 4.0000 mg | Freq: Four times a day (QID) | INTRAMUSCULAR | Status: DC | PRN

## 2020-03-23 MED ORDER — SODIUM CHLORIDE 0.9 % IV SOLN
INTRAVENOUS | Status: AC
  Filled 2020-03-23: qty 2

## 2020-03-23 MED ORDER — MIDAZOLAM HCL 5 MG/5ML IJ SOLN
INTRAMUSCULAR | Status: AC
  Filled 2020-03-23: qty 5

## 2020-03-23 MED ORDER — ACETAMINOPHEN 325 MG PO TABS
325.0000 mg | ORAL_TABLET | ORAL | Status: DC | PRN
  Administered 2020-03-23 (×2): 650 mg via ORAL
  Filled 2020-03-23 (×5): qty 2

## 2020-03-23 MED ORDER — CEFAZOLIN SODIUM-DEXTROSE 2-4 GM/100ML-% IV SOLN
INTRAVENOUS | Status: AC
  Filled 2020-03-23: qty 100

## 2020-03-23 MED ORDER — HEPARIN (PORCINE) IN NACL 1000-0.9 UT/500ML-% IV SOLN
INTRAVENOUS | Status: DC | PRN
  Administered 2020-03-23: 500 mL

## 2020-03-23 MED ORDER — IOHEXOL 350 MG/ML SOLN
INTRAVENOUS | Status: DC | PRN
  Administered 2020-03-23: 10 mL

## 2020-03-23 MED ORDER — CEFAZOLIN SODIUM-DEXTROSE 2-4 GM/100ML-% IV SOLN
2.0000 g | INTRAVENOUS | Status: AC
  Administered 2020-03-23: 2 g via INTRAVENOUS

## 2020-03-23 MED ORDER — MIDAZOLAM HCL 5 MG/5ML IJ SOLN
INTRAMUSCULAR | Status: DC | PRN
  Administered 2020-03-23 (×4): 1 mg via INTRAVENOUS

## 2020-03-23 MED ORDER — SODIUM CHLORIDE 0.9 % IV SOLN
80.0000 mg | INTRAVENOUS | Status: AC
  Administered 2020-03-23: 80 mg

## 2020-03-23 SURGICAL SUPPLY — 22 items
ADAPTER SEALING SSA-EW-09 (ADAPTER) ×1 IMPLANT
ADPR INTRO LNG 9FR SL XTD WNG (ADAPTER) ×1
BALLN COR SINUS VENO 6FR 80 (BALLOONS) ×2
BALLOON COR SINUS VENO 6FR 80 (BALLOONS) IMPLANT
CABLE SURGICAL S-101-97-12 (CABLE) ×2 IMPLANT
CATH ATTAIN COM SURV 6250V-EH (CATHETERS) ×1 IMPLANT
ICD GALLANT HFCRTD CDHFA500Q (ICD Generator) ×1 IMPLANT
KIT ESSENTIALS PG (KITS) ×1 IMPLANT
LEAD DURATA 7122Q-65CM (Lead) ×1 IMPLANT
LEAD QUARTET 1458Q-86CM (Lead) ×1 IMPLANT
LEAD TENDRIL MRI 52CM LPA1200M (Lead) ×1 IMPLANT
PAD PRO RADIOLUCENT 2001M-C (PAD) ×2 IMPLANT
POUCH AIGIS-R ANTIBACT ICD (Mesh General) ×2 IMPLANT
POUCH AIGIS-R ANTIBACT ICD LRG (Mesh General) IMPLANT
SHEATH 7FR PRELUDE SNAP 13 (SHEATH) ×1 IMPLANT
SHEATH 8FR PRELUDE SNAP 13 (SHEATH) ×1 IMPLANT
SHEATH 9.5FR PRELUDE SNAP 13 (SHEATH) ×1 IMPLANT
SHEATH PROBE COVER 6X72 (BAG) ×2 IMPLANT
SLITTER 6232ADJ (MISCELLANEOUS) ×1 IMPLANT
TRAY PACEMAKER INSERTION (PACKS) ×2 IMPLANT
WIRE ACUITY WHISPER EDS 4648 (WIRE) ×1 IMPLANT
WIRE HI TORQ VERSACORE-J 145CM (WIRE) ×1 IMPLANT

## 2020-03-23 NOTE — Interval H&P Note (Signed)
History and Physical Interval Note:  03/23/2020 9:15 AM  [name]  has presented today for surgery, with the diagnosis of cardio myopathy.  The various methods of treatment have been discussed with the patient and family. After consideration of risks, benefits and other options for treatment, the patient has consented to  Procedure(s): BIV ICD INSERTION CRT-D (N/A) as a surgical intervention.  The patient's history has been reviewed, patient examined, no change in status, stable for surgery.  I have reviewed the patient's chart and labs.  Questions were answered to the patient's satisfaction.     Jamile Sivils T Reanne Nellums

## 2020-03-23 NOTE — Discharge Instructions (Signed)
After Your ICD (Implantable Cardiac Defibrillator)    You have a [employer] ICD  ACTIVITY  Do not lift your arm above shoulder height for 1 week after your procedure. After 7 days, you may progress as below.   You should remove your sling 24 hours after your procedure, unless otherwise instructed by your provider.     Monday March 30, 2020  Tuesday March 31, 2020 Wednesday April 01, 2020 Thursday April 02, 2020    Do not lift, push, pull, or carry anything over 10 pounds with the affected arm until 6 weeks (Monday May 04, 2020 ) after your procedure.    Do NOT DRIVE until you have been seen for your wound check, or as [place] as instructed by your healthcare provider.    Ask your healthcare provider when you can go back to work   INCISION/Dressing  You should wait until Saturday, 1/29 to resume your Eliquis, starting with the EVENING dose.   Monitor your defibrillator site for redness, swelling, and drainage. Call the device clinic at [phone] if you experience these symptoms or fever/chills.   If your incision is sealed with Steri-strips or staples, you may shower 10 days after your procedure or when told by your provider. Do not remove the steri-strips or let the shower hit directly on your site. You may wash around your site with soap and water.     Avoid lotions, ointments, or perfumes over your incision until it is well-healed.   You may use a hot tub or a pool AFTER your wound check appointment if the incision is completely closed.   Your ICD is designed to protect you from life threatening heart rhythms. Because of this, you may receive a shock.   o 1 shock with no symptoms:  Call the office during business hours. o 1 shock with symptoms (chest pain, chest pressure, dizziness, lightheadedness, shortness of breath, overall feeling unwell):  Call 911. o If you experience 2 or more shocks in 24 hours:  Call 911. o If you receive a shock, you should not  drive for 6 months per the [place] DMV IF you receive appropriate therapy from your ICD.    ICD Alerts:  Some alerts are vibratory and others beep. These are NOT emergencies. Please call our office to let us know. If this occurs at night or on weekends, it can wait until the next business day. Send a remote transmission.   If your device is capable of reading fluid status (for heart failure), you will be offered monthly monitoring to review this with you.   DEVICE MANAGEMENT  Remote monitoring is used to monitor your ICD from home. This monitoring is scheduled every 91 days by our office. It allows [place] to keep an eye on the functioning of your device to ensure it is working properly. You will routinely see your Electrophysiologist annually (more often if necessary).    You should receive your ID card for your new device in 4-8 weeks. Keep this card with you at all times once received. Consider wearing a medical alert bracelet or necklace.   Your ICD  may be MRI compatible. This will be discussed at your next office visit/wound check.  You should avoid contact with strong electric or magnetic fields.    Do not use amateur (ham) radio equipment or electric (arc) welding torches. MP3 player headphones with magnets should not be used. Some devices are safe to use if held at least 12 inches (30 cm)  from your defibrillator. These include power tools, lawn mowers, and speakers. If you are unsure if something is safe to use, ask your health care provider.   When using your cell phone, hold it to the ear that is on the opposite side from the defibrillator. Do not leave your cell phone in a pocket over the defibrillator.   You may safely use electric blankets, heating pads, computers, and microwave ovens.  Call the office right away if:  You have chest pain.  You feel more than one shock.  You feel more short of breath than you have felt before.  You feel more light-headed than you have felt  before.  Your incision starts to open up.  This information is not intended to replace advice given to you by your health care provider. Make sure you discuss any questions you have with your health care provider.

## 2020-03-23 NOTE — Progress Notes (Signed)
Dr Quentin Ore notified of b/p's and no new orders client without complaints

## 2020-03-24 ENCOUNTER — Telehealth: Payer: Self-pay

## 2020-03-24 ENCOUNTER — Encounter (HOSPITAL_COMMUNITY): Payer: Self-pay | Admitting: Cardiology

## 2020-03-24 MED FILL — Lidocaine HCl Local Inj 1%: INTRAMUSCULAR | Qty: 60 | Status: AC

## 2020-03-24 NOTE — Telephone Encounter (Signed)
Follow-up after same day discharge: Implant date: 03/23/2020 MD: Dr. Lars Mage Device: Abbott [id number] Location:Left Chest    Wound check visit: 04/02/2020 @ 10:00 90 day MD follow-up: 07/01/2020  Remote Transmission received: 03/24/2020  Dressing removed: Yes  Wound care and ifting restrictions discussed with patient. Advised to call for further questions or concerns.

## 2020-03-25 ENCOUNTER — Telehealth: Payer: Self-pay

## 2020-03-25 DIAGNOSIS — Z006 Encounter for examination for normal comparison and control in clinical research program: Secondary | ICD-10-CM

## 2020-03-25 NOTE — Telephone Encounter (Signed)
Called subject for [place] telephone visit. This subject is followed by telephone only. The patient was diagnosed with cardiomyopathy in September 2019, this was updated and reported as an AE. The subject underwent BIV ICD insertion of CRT-D on 03/23/2020 for his cardiomyopathy. This was updated and reported as a concomitant procedure. Reviewed concomitant medications; no updates to be made at this time. Scheduled next telephone call for 05/23/2020.

## 2020-03-26 ENCOUNTER — Inpatient Hospital Stay (HOSPITAL_COMMUNITY): Admission: RE | Admit: 2020-03-26 | Payer: PPO | Source: Ambulatory Visit

## 2020-03-27 NOTE — Telephone Encounter (Signed)
Patient was denied assistance from [facility] due to being over the income limit.   Called and spoke with the patient. He is aware that the 30 day co-pay is $45 and 90 days is $90.  He is going to fill 30 days until after he sees Dr. [name] again in March. If he is still on Jardiance after his next appointment, he is going to get a 90 day supply.   Charlann Boxer, CPhT

## 2020-04-02 ENCOUNTER — Other Ambulatory Visit: Payer: Self-pay

## 2020-04-02 ENCOUNTER — Ambulatory Visit (INDEPENDENT_AMBULATORY_CARE_PROVIDER_SITE_OTHER): Payer: PPO | Admitting: Emergency Medicine

## 2020-04-02 DIAGNOSIS — I428 Other cardiomyopathies: Secondary | ICD-10-CM

## 2020-04-02 LAB — CUP PACEART INCLINIC DEVICE CHECK
Battery Remaining Longevity: 58 mo
Brady Statistic RA Percent Paced: 32 %
Brady Statistic RV Percent Paced: 97 %
Date Time Interrogation Session: 20220203112650
HighPow Impedance: 51.75 Ohm
Implantable Lead Implant Date: 20220124
Implantable Lead Implant Date: 20220124
Implantable Lead Implant Date: 20220124
Implantable Lead Location: 753858
Implantable Lead Location: 753859
Implantable Lead Location: 753860
Implantable Pulse Generator Implant Date: 20220124
Lead Channel Impedance Value: 437.5 Ohm
Lead Channel Impedance Value: 450 Ohm
Lead Channel Impedance Value: 500 Ohm
Lead Channel Pacing Threshold Amplitude: 0.625 V
Lead Channel Pacing Threshold Amplitude: 0.75 V
Lead Channel Pacing Threshold Amplitude: 0.75 V
Lead Channel Pacing Threshold Amplitude: 0.75 V
Lead Channel Pacing Threshold Amplitude: 0.75 V
Lead Channel Pacing Threshold Amplitude: 2 V
Lead Channel Pacing Threshold Pulse Width: 0.5 ms
Lead Channel Pacing Threshold Pulse Width: 0.5 ms
Lead Channel Pacing Threshold Pulse Width: 0.5 ms
Lead Channel Pacing Threshold Pulse Width: 0.5 ms
Lead Channel Pacing Threshold Pulse Width: 0.5 ms
Lead Channel Pacing Threshold Pulse Width: 0.5 ms
Lead Channel Sensing Intrinsic Amplitude: 1 mV
Lead Channel Sensing Intrinsic Amplitude: 12 mV
Lead Channel Setting Pacing Amplitude: 1.625
Lead Channel Setting Pacing Amplitude: 3.125
Lead Channel Setting Pacing Amplitude: 3.5 V
Lead Channel Setting Pacing Pulse Width: 0.5 ms
Lead Channel Setting Pacing Pulse Width: 0.5 ms
Lead Channel Setting Sensing Sensitivity: 0.5 mV
Pulse Gen Serial Number: 810017138

## 2020-04-02 NOTE — Progress Notes (Signed)
Wound check appointment for CRT-D. Steri-strips removed. Wound without redness or edema. Incision edges approximated, wound well healed. Normal device function. Thresholds, sensing, and impedances consistent with implant measurements. Device programmed at 3.5V for extra safety margin until 3 month visit. Histogram distribution appropriate for patient and level of activity. No mode switches or ventricular arrhythmias noted. Patient educated about wound care, arm mobility, lifting restrictions, shock plan. ROV with Dr Quentin Ore 07/01/20. Enrolled in remote follow-up and next remote 06/23/20.

## 2020-04-03 ENCOUNTER — Other Ambulatory Visit: Payer: PPO

## 2020-04-03 ENCOUNTER — Other Ambulatory Visit (HOSPITAL_COMMUNITY): Payer: PPO

## 2020-04-18 NOTE — Progress Notes (Addendum)
Office Visit    Patient Name: [name] Date of Encounter: 04/22/2020  PCP:  Glean Hess, MD   [place]  Cardiologist:  Nelva Bush, MD  Advanced Practice Provider:  No care team member to display Electrophysiologist:  Vickie Epley, MD   Chief Complaint    Chief Complaint  Patient presents with  . 1 month follow up     "doing well with the Defib/pacemaker." Patient c/o pain in feet, feels it is gout.  Medications reviewed by the patient verbally.     70 yo male with history of CAD with NSTEMI 07/2015 s/p PCI to LCx s/p CABG x4 10/2019 for development of severe distal LMCA disease,chronic HFrEF (mixed ICM/NICM) and known LBBB s/p 02/2020 CRT-D implantation (St. Jude) for ICM, paroxysmal atrial fibrillation on Eliquis, carotid disease s/p R carotid endarterectomy, PAD s/p intervention and stent of left SFA 05/2018, statin intolerance on Praluent, HTN, HLD, hiatal hernia, previous tobacco use, PUD, and here for follow-up after device placement.  Past Medical History    Past Medical History:  Diagnosis Date  . Abnormal nuclear cardiac imaging test 08/08/2015  . Arthritis    fingers  . Carotid artery occlusion   . CHF (congestive heart failure) ([place])   . Coronary atherosclerosis of native coronary artery 01/29/2013   11/05/19 R/LHC 80% dLMCA stenosis small diffusely dz dLAD, chronically occluded OM1 50%mRCA lesion, widely patent mLCx strent, moderately elevated L heart filling pressures, mild to moderate RH filling pressures, normal to moderately reduced CO  . Duodenal erosion   . Encounter for screening for lung cancer 07/13/2016  . Esophageal stenosis    esophageal dilation  . GERD (gastroesophageal reflux disease)   . H. pylori infection   . Heart attack [facility]) Oct. 2009   Mild  . Hiatal hernia   . Hyperlipidemia   . Hypertension   . 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  Cardiac catheterization-nonobstructive 60% RCA disease-subsequent nuclear stress test-9 minutes, low risk, mild inferior wall hypokinesis   . Pain in limb 12/19/2017  . Peripheral vascular disease ([place])   . Unstable angina ([place]) 11/25/2017   Past Surgical History:  Procedure Laterality Date  . APPENDECTOMY    . BACK SURGERY    . BIV ICD INSERTION CRT-D N/A 03/23/2020   Procedure: BIV ICD INSERTION CRT-D;  Surgeon: Vickie Epley, MD;  Location: [place] [facility];  Service: Cardiovascular;  Laterality: N/A;  . CARDIAC CATHETERIZATION N/A 08/07/2015   Procedure: Left Heart Cath and Coronary Angiography;  Surgeon: [name], MD;  Location: [place] [facility];  Service: Cardiovascular;  Laterality: N/A;  . CARDIAC CATHETERIZATION N/A 08/07/2015   Procedure: Coronary Stent Intervention;  Surgeon: [name], MD;  Location: [place] [facility];  Service: Cardiovascular;  Laterality: N/A;  . CARDIAC CATHETERIZATION N/A 08/07/2015   Procedure: Coronary Stent Intervention;  Surgeon: [name], MD;  Location: [place] [facility];  Service: Cardiovascular;  Laterality: N/A;  . CAROTID ENDARTERECTOMY  01/05/2006   Right  CEA with DPA  . CATARACT EXTRACTION W/ INTRAOCULAR LENS IMPLANT Left 12/04/2017  . CATARACT EXTRACTION W/PHACO Left 12/04/2017   Procedure: CATARACT EXTRACTION PHACO AND INTRAOCULAR LENS PLACEMENT ([place]) LEFT;  Surgeon: Eulogio Bear, MD;  Location: [place];  Service: Ophthalmology;  Laterality: Left;  . CATARACT EXTRACTION W/PHACO Right 02/06/2018   Procedure: CATARACT EXTRACTION PHACO AND INTRAOCULAR LENS PLACEMENT (  IOC)RIGHT;  Surgeon: Eulogio Bear, MD;  Location: [place];  Service: Ophthalmology;  Laterality: Right;  . COLONOSCOPY  05/20/2008  . COLONOSCOPY WITH PROPOFOL N/A 09/13/2018   Procedure: COLONOSCOPY WITH BIOPSY;  Surgeon: [name], MD;  Location: [place];  Service: Endoscopy;  Laterality: N/A;  . CORONARY ARTERY  BYPASS GRAFT N/A 11/11/2019   Procedure: CORONARY ARTERY BYPASS GRAFTING (CABG) USING LIMA to Diag1; ENDOSCOPICALLY HARVESTED RIGHT GREATER SAPHENOUS VEIN: SVG to OM1; SVG to OM2; SVG to PDA.;  Surgeon: Ivin Poot, MD;  Location: [place];  Service: Open Heart Surgery;  Laterality: N/A;  . CORONARY STENT PLACEMENT  08/07/2015   MID CIRCUMFLEX  . ENDOVEIN HARVEST OF GREATER SAPHENOUS VEIN Right 11/11/2019   Procedure: ENDOVEIN HARVEST OF GREATER SAPHENOUS VEIN;  Surgeon: Ivin Poot, MD;  Location: [place];  Service: Open Heart Surgery;  Laterality: Right;  . ESOPHAGOGASTRODUODENOSCOPY (EGD) WITH PROPOFOL N/A 02/11/2019   Procedure: ESOPHAGOGASTRODUODENOSCOPY (EGD) WITH BIOPSY and  Dilation;  Surgeon: [name], MD;  Location: [place];  Service: Endoscopy;  Laterality: N/A;  . HIP SURGERY Left 10/2016   left hip tendon repair  . LEFT HEART CATH AND CORONARY ANGIOGRAPHY N/A 11/27/2017   Procedure: LEFT HEART CATH AND CORONARY ANGIOGRAPHY;  Surgeon: Wellington Hampshire, MD;  Location: [place] [facility];  Service: Cardiovascular;  Laterality: N/A;  . LOWER EXTREMITY ANGIOGRAPHY Left 02/12/2018   Procedure: LOWER EXTREMITY ANGIOGRAPHY;  Surgeon: Algernon Huxley, MD;  Location: [place] [facility];  Service: Cardiovascular;  Laterality: Left;  . LOWER EXTREMITY ANGIOGRAPHY Left 03/07/2018   Procedure: LOWER EXTREMITY ANGIOGRAPHY;  Surgeon: Algernon Huxley, MD;  Location: [place] [facility];  Service: Cardiovascular;  Laterality: Left;  . LOWER EXTREMITY ANGIOGRAPHY Left 06/04/2018   Procedure: LOWER EXTREMITY ANGIOGRAPHY;  Surgeon: Algernon Huxley, MD;  Location: [place] [facility];  Service: Cardiovascular;  Laterality: Left;  . PLACEMENT OF IMPELLA LEFT VENTRICULAR ASSIST DEVICE N/A 11/11/2019   Procedure: PLACEMENT OF IMPELLA LEFT VENTRICULAR ASSIST DEVICE 5.5;  Surgeon: Ivin Poot, MD;  Location: [place];  Service: Open Heart Surgery;  Laterality: N/A;  Midline Sternotomy  .  POLYPECTOMY N/A 09/13/2018   Procedure: POLYPECTOMY;  Surgeon: [name], MD;  Location: [place];  Service: Endoscopy;  Laterality: N/A;  . POLYPECTOMY N/A 02/11/2019   Procedure: POLYPECTOMY;  Surgeon: [name], MD;  Location: [place];  Service: Endoscopy;  Laterality: N/A;  . REMOVAL OF IMPELLA LEFT VENTRICULAR ASSIST DEVICE N/A 11/15/2019   Procedure: REMOVAL OF IMPELLA 5.5 LEFT VENTRICULAR ASSIST DEVICE;  Surgeon: Ivin Poot, MD;  Location: [place];  Service: Open Heart Surgery;  Laterality: N/A;  . RIGHT/LEFT HEART CATH AND CORONARY ANGIOGRAPHY N/A 11/05/2019   Procedure: RIGHT/LEFT HEART CATH AND CORONARY ANGIOGRAPHY;  Surgeon: Nelva Bush, MD;  Location: [place] [facility];  Service: Cardiovascular;  Laterality: N/A;  . SPINE SURGERY    . TEE WITHOUT CARDIOVERSION N/A 11/11/2019   Procedure: TRANSESOPHAGEAL ECHOCARDIOGRAM (TEE);  Surgeon: Prescott Gum, Collier Salina, MD;  Location: [place];  Service: Open Heart Surgery;  Laterality: N/A;  . TEE WITHOUT CARDIOVERSION N/A 11/15/2019   Procedure: TRANSESOPHAGEAL ECHOCARDIOGRAM (TEE);  Surgeon: Prescott Gum, Collier Salina, MD;  Location: [place];  Service: Open Heart Surgery;  Laterality: N/A;  . TONSILLECTOMY      Allergies  Allergies  Allergen Reactions  . Brilinta [Ticagrelor] Shortness Of Breath  . Chlorhexidine Gluconate Other (See Comments)    Skin  burning for hours afterward  . Statins Other (See Comments)    Failed Crestor 5 mg twice weekly, Crestor 20 mg daily, Pravastatin 40 mg qd, Lipitor, Zocor - muscle aches  . Zetia [Ezetimibe] Other (See Comments)    Muscle aches    History of Present Illness    KEYAN FOLSON is a 70 y.o. male with PMH as above.   He has history of PAD and underwent mechanical thrombectomy and cathter thrombolysis to the L SFA in early 05/2018 and reported minimal leg pain at that time.  He is s/p right CEA with carotid Dopplers 10/2019 showing 40 to 00% LICA.  He is intolerant to statins and  on Praluent.  He has history of CAD s/p 2017 non-STEMI and subsequent cath with PCI to LCx 2017.  He underwent repeat LHC 10/2017 with widely patent LCx stent without significant restenosis and otherwise mild to moderate CAD, EF 40-45%, LVEDP 60mmg.  Echo EF 45-50%. Repeat limited echo 01/2018 EF 40-45%.  Medical management advised.  Seen in clinic 09/2019 with DOE and abnormal EKG. Subsequent outpatient [facility] 11/05/2019 showed multivessel CAD with development of severe distal LMCA disease.  Echo EF 25 to 30%, LV severely dilated, mild MR. He was transferred to [facility] for CABG x4 (LIMA-LAD, SVG-OM1, SVG-OM 2, SVG-PDA) with placement of Impella 5.5 on 11/11/2019 (removed POD4). He had postoperative atrial fibrillation with NSR restored once started on IV amiodarone and transitioned to oral amiodarone and Eliquis at discharge. He was IV diuresed and transitioned to p.o. Lasix 40 mg daily at discharge.    When seen by the heart failure clinic 03/02/2020, he was riding his exercise bike / lifting weights 3 times per week.  He was golfing 3-4 times per week. He noted DOE only with heavy exertion.  He was using Lasix on a PRN basis and euvolemic. He remained in NSR with PVCs and known LBBB.  He was no longer taking amiodarone and off of digoxin, due to elevated level and nausea.  Same day echo with EF 30 to 35%, global hypokinesis with septal-lateral dyssynchrony due to LBBB, reduced RV SF, mild LVH, G1 DD.  Given his persistently low EF and LBBB with prominent septal-lateral dyssynchrony on echo, recommendation was for CRT-D implant.    He was seen prior to CRT implantation by his primary cardiologist, Dr. Harrell Gave End, and reported almost complete resolution of chest wall soreness following his CABG with minimal DOE.  He was concerned his heart failure medications were making him feel worse than before his CABG. [place] to elevated K+, spironolactone was discontinued and Entresto held then restarted the evening of 1/22  after repeat labs showed K+ returned to normal range.  It was noted creatinine was still slightly above baseline with recommendation to drink extra water.  Recommendation was for repeat BMP at the time of his CRT-D placement or on Tuesday prior to discharge home.  This does not appear to have yet been collected.  He underwent St. Jude CRT-D implant 03/23/2020 by Dr. Quentin Ore without complications.  Subsequent wound check appointment 2/3 without erythema or edema and Steri-Strips removed.  Device interrogation 2/3 without significant findings.   Today, 04/22/2020, he returns to clinic and notes he is overall doing well from a cardiac standpoint.  He is excited to finally be able to return to golf 1 day in the near future.  He denies any chest pain, racing heart rate, palpitations, presyncope, syncope, early satiety, PND, orthopnea, or signs or symptoms of  bleeding.  He reports gradual weight gain, attributed to sedentary lifestyle with restrictions after CRT-D implantation and an eagerness to return to activity once able.  He again reports his desire to consolidate his physicians and only receive care through his primary cardiologist and Dr. Quentin Ore in [place].  We reviewed his recent pacemaker insertion and cardiac event and intervention.  He reports his only complaint has been of left big toe gout and requests a refill to patch hi through until able to fill via his PCP.  Home Medications    Current Outpatient Medications on File Prior to Visit  Medication Sig Dispense Refill  . Alirocumab (PRALUENT) 150 MG/ML SOAJ Inject 1 pen into the skin every 14 (fourteen) days. 2 mL 11  . apixaban (ELIQUIS) 5 MG TABS tablet Take 1 tablet (5 mg total) by mouth 2 (two) times daily. 180 tablet 3  . carvedilol (COREG) 6.25 MG tablet Take 1.5 tablets (9.375 mg total) by mouth 2 (two) times daily. 135 tablet 3  . empagliflozin (JARDIANCE) 10 MG TABS tablet Take 1 tablet (10 mg total) by mouth daily before breakfast. 30  tablet 3  . nitroGLYCERIN (NITROSTAT) 0.4 MG SL tablet Place 1 tablet (0.4 mg total) under the tongue every 5 (five) minutes as needed for chest pain. 25 tablet 2  . pantoprazole (PROTONIX) 40 MG tablet Take 40 mg by mouth daily.    . sacubitril-valsartan (ENTRESTO) 49-51 MG Take 1 tablet by mouth 2 (two) times daily. 180 tablet 3  . traMADol (ULTRAM) 50 MG tablet Take 1 tablet (50 mg total) by mouth every 6 (six) hours as needed. 40 tablet 0  . traZODone (DESYREL) 50 MG tablet TAKE 1 TABLET BY MOUTH AT BEDTIME AS NEEDED FOR SLEEP. 90 tablet 1  . triamcinolone cream (KENALOG) 0.1 % Apply 1 application topically 2 (two) times daily as needed for itching.     No current facility-administered medications on file prior to visit.    Review of Systems    He denies chest pain, palpitations, dyspnea, pnd, orthopnea, n, v, dizziness, syncope, edema, or early satiety. He reports gout in his L big toe.  He reports weight gain/gradual weight gain attributed to sedentary lifestyle with current restrictions on activity after CRT-D implantation.  All other systems reviewed and are otherwise negative except as noted above.  Physical Exam    VS:  BP 100/60 (BP Location: Left Arm, Patient Position: Sitting, Cuff Size: Normal)   Pulse 64   Ht 5\' 6"  (1.676 m)   Wt 180 lb 6 oz (81.8 kg)   SpO2 96%   BMI 29.11 kg/m  , BMI Body mass index is 29.11 kg/m. GEN: Well nourished, well developed, in no acute distress. HEENT: normal. Neck: Supple, no JVD, carotid bruits, or masses. Cardiac: RRR, no murmurs, rubs, or gallops. No clubbing, cyanosis, edema.  Radials/DP/PT 2+ and equal bilaterally. Sternal incision healing well without any evidence of erythema /infection.  Pacemaker pocket incision healing well without erythema or warmth. Respiratory:  Respirations regular and unlabored, clear to auscultation bilaterally. GI: Soft, nontender, nondistended, BS + x 4. MS: no deformity or atrophy. Skin: warm and dry, no  rash. Neuro:  Strength and sensation are intact. Psych: Normal affect.  Accessory Clinical Findings    ECG personally reviewed by me today - BiV paced rhythm, 64bpm - no acute changes.  VITALS Reviewed today   Temp Readings from Last 3 Encounters:  03/23/20 98.1 F (36.7 C) (Oral)  01/29/20 97.6 F (36.4 C) (  Skin)  12/18/19 97.6 F (36.4 C) (Skin)   BP Readings from Last 3 Encounters:  04/22/20 100/60  03/23/20 131/74  03/19/20 102/68   Pulse Readings from Last 3 Encounters:  04/22/20 64  03/23/20 62  03/19/20 62    Wt Readings from Last 3 Encounters:  04/22/20 180 lb 6 oz (81.8 kg)  03/23/20 176 lb (79.8 kg)  03/19/20 177 lb (80.3 kg)     LABS  reviewed today   Lab Results  Component Value Date   WBC 4.1 03/02/2020   HGB 14.0 03/02/2020   HCT 44.6 03/02/2020   MCV 91.0 03/02/2020   PLT 192 03/02/2020   Lab Results  Component Value Date   CREATININE 1.47 (H) 03/21/2020   BUN 38 (H) 03/21/2020   NA 137 03/21/2020   K 4.7 03/21/2020   CL 102 03/21/2020   CO2 26 03/21/2020   Lab Results  Component Value Date   ALT 16 11/18/2019   AST 14 (L) 11/18/2019   ALKPHOS 50 11/18/2019   BILITOT 0.8 11/18/2019   Lab Results  Component Value Date   CHOL 144 01/07/2020   HDL 62 01/07/2020   LDLCALC 50 01/07/2020   TRIG 158 (H) 01/07/2020   CHOLHDL 2.3 01/07/2020    Lab Results  Component Value Date   HGBA1C 5.6 11/07/2019   Lab Results  Component Value Date   TSH 3.303 11/07/2019     STUDIES/PROCEDURES reviewed today   R/LHC 11/05/19 Conclusions: 1. Multivessel CAD, including calcified 80% distal LMCA stenosis, small diffusely diseased distal LAD, chronically occluded OM1, and 50% mid RCA lesion. 2. Widely patent mid LCx stent. 3. Moderately elevated left heart filling pressure. 4. Mildly to moderately elevated right heart filling pressure. 5. Normal to mildly reduced cardiac output/index. Recommendations: 1. Transfer to [facility] for cardiac  surgery consultation for CABG, given severe LMCA disease and low LVEF. 2. Hold clopidogrel pending cardiac surgery consultation. 3. Obtain echocardiogram. 4. Aggressive secondary prevention.  US Carotid and extremities 11/07/19 Right Carotid: Velocities in the right ICA are consistent with a 1-39%  stenosis.  Left Carotid: Velocities in the left ICA are consistent with a 40-59%  stenosis.  Vertebrals: Bilateral vertebral arteries demonstrate antegrade flow.  Subclavians: Normal flow hemodynamics were seen in bilateral subclavian        arteries.  Right ABI: Resting right ankle-brachial index indicates mild right lower  extremity arterial disease. The right toe-brachial index is abnormal.  Left ABI: Resting left ankle-brachial index indicates moderate left lower  extremity arterial disease. The left toe-brachial index is abnormal.  Right Upper Extremity: Doppler waveform obliterate with right radial  compression. Doppler waveforms remain within normal limits with right  ulnar compression.  Left Upper Extremity: Doppler waveform obliterate with left radial  compression. Doppler waveforms remain within normal limits with left ulnar  compression.   Echo 03/02/20 1. Left ventricular ejection fraction, by estimation, is 30 to 35%. The  left ventricle has moderately decreased function. The left ventricle  demonstrates global hypokinesis with septal-lateral dyssynchrony due to  LBBB. There is mild left ventricular  hypertrophy. Left ventricular diastolic parameters are consistent with  Grade I diastolic dysfunction (impaired relaxation).  2. The mitral valve is normal in structure. Trivial mitral valve  regurgitation. No evidence of mitral stenosis.  3. The aortic valve is tricuspid. Aortic valve regurgitation is trivial.  No aortic stenosis is present.  4. Right ventricular systolic function is mildly reduced. The right  ventricular size is normal. Tricuspid  regurgitation  signal is inadequate  for assessing PA pressure.  5. The inferior vena cava is normal in size with greater than 50%  respiratory variability, suggesting right atrial pressure of 3 mmHg.   Assessment & Plan    Coronary artery disease s/p CABG x4 (10/2019)  --No angina reported.  Continues to recover well from CABG 10/2019.  EKG without acute ST/T changes.  Continue ASA and carvedilol.  Continue Eliquis in lieu of ASA.  Continue PPI to protect the stomach.  Sublingual nitro as needed for chest pain.  Continue Praluent for aggressive risk factor modification.   Chronic HFrEF (EF 30-35%, 02/2020) S/p CRT-D implantation (St Jude, 02/2020) History of hyperkalemia --Euvolemic and well compensated on exam.  Reports improvement in previously reported dyspnea s/p 02/2020 CRT-D implantation (St. Jude) for ICM and ongoing reduced EF 30-35% and LBBB.  Weight gain reported in the setting of sedentary lifestyle and has been gradual and not attributed to fluid with patient euvolemic on exam.  We will repeat labs today, given history of hyperkalemia.  He remains off of spironolactone.  Continue current beta-blocker, Jardiance 10 mg daily, and Entresto 49-51 mg twice daily.  Further recommendations, if indicated, following repeat BMET.  Continue to follow-up with the EP regarding device /automated device checks.  Return to activity per guidelines given after CRT-D implantation.  Postoperative atrial fibrillation Chronic anticoagulation --NSR by EKG today.  No signs or symptoms of bleeding reported on Eliquis 5 mg twice daily.  He does report some difficulty with affording this medication.  We will contact the Eliquis representative today and call back the patient with further recommendations.  Samples and Eliquis coupon provided in the office for assistance.  Continue Eliquis and carvedilol.  Hyperlipidemia, LDL goal below 70 Statin intolerance --Continue Praluent.  LDL goal below 70.  12/2019 LDL  50.  Hypertension, goal BP 130/80 or lower --BP well controlled to soft at 100/60.  Continue current medications.  PAD -History of PAD. S/p bilateral lower extremity interventions by Dr. Lucky Cowboy.  Denies any symptoms of worsening disease today.  No reported claudication / worsening pain in lower extremities or edema on exam.   Most recent 10/2019 studies as above show right ABI indicates mild right lower extremity arterial disease with right TBI abnormal; left ABI indicates moderate left lower extremity arterial disease with left TBI abnormal.  Continue to monitor with annual studies. Risk factor modification with HR, BP, and cholesterol control. Continue Elqiuis and Praluent.   Carotid artery disease --S/p right carotid endarterectomy ectomy with history of carotid disease.  No report of dizziness/presyncope or amaurosis fugax.  No bruit on exam. Most recent 10/2019 carotid studies as above.  Studies show RICA 1 to 38%V, LICA 40 to 56%E.  Risk factor modification with HR/BP/cholesterol control.  Continue current Eliquis and Praluent.  Continue annual studies to monitor.  Gout --Reports significant symptoms of left big toe gout.  We will provide a prescription for colchicine 0.6 mg daily as needed to patch him through until his next PCP visit.  Further refills per PCP.  Medication changes: None.  Refill for colchicine provided until able to contact PCP.  We will contact the Eliquis representative regarding the cost of this medication.  He will contact his insurance to see if Xarelto is preferred over that of Eliquis. Labs ordered: BMET to reassess K+, given history of hyperkalemia Studies / Imaging ordered: None Future considerations: None Disposition: RTC 3 months or sooner if indicated   Arvil Chaco,  PA-C 04/22/2020

## 2020-04-22 ENCOUNTER — Encounter: Payer: Self-pay | Admitting: Physician Assistant

## 2020-04-22 ENCOUNTER — Ambulatory Visit: Payer: PPO | Admitting: Physician Assistant

## 2020-04-22 ENCOUNTER — Other Ambulatory Visit: Payer: Self-pay

## 2020-04-22 VITALS — BP 100/60 | HR 64 | Ht 66.0 in | Wt 180.4 lb

## 2020-04-22 DIAGNOSIS — E785 Hyperlipidemia, unspecified: Secondary | ICD-10-CM | POA: Diagnosis not present

## 2020-04-22 DIAGNOSIS — I6523 Occlusion and stenosis of bilateral carotid arteries: Secondary | ICD-10-CM | POA: Diagnosis not present

## 2020-04-22 DIAGNOSIS — E875 Hyperkalemia: Secondary | ICD-10-CM | POA: Diagnosis not present

## 2020-04-22 DIAGNOSIS — Z79899 Other long term (current) drug therapy: Secondary | ICD-10-CM | POA: Diagnosis not present

## 2020-04-22 DIAGNOSIS — Z9889 Other specified postprocedural states: Secondary | ICD-10-CM | POA: Diagnosis not present

## 2020-04-22 DIAGNOSIS — I5022 Chronic systolic (congestive) heart failure: Secondary | ICD-10-CM

## 2020-04-22 DIAGNOSIS — Z87891 Personal history of nicotine dependence: Secondary | ICD-10-CM | POA: Diagnosis not present

## 2020-04-22 DIAGNOSIS — I447 Left bundle-branch block, unspecified: Secondary | ICD-10-CM

## 2020-04-22 DIAGNOSIS — I10 Essential (primary) hypertension: Secondary | ICD-10-CM

## 2020-04-22 DIAGNOSIS — I480 Paroxysmal atrial fibrillation: Secondary | ICD-10-CM

## 2020-04-22 DIAGNOSIS — Z95810 Presence of automatic (implantable) cardiac defibrillator: Secondary | ICD-10-CM

## 2020-04-22 DIAGNOSIS — I739 Peripheral vascular disease, unspecified: Secondary | ICD-10-CM

## 2020-04-22 DIAGNOSIS — G720 Drug-induced myopathy: Secondary | ICD-10-CM

## 2020-04-22 DIAGNOSIS — T466X5A Adverse effect of antihyperlipidemic and antiarteriosclerotic drugs, initial encounter: Secondary | ICD-10-CM

## 2020-04-22 DIAGNOSIS — I2510 Atherosclerotic heart disease of native coronary artery without angina pectoris: Secondary | ICD-10-CM | POA: Diagnosis not present

## 2020-04-22 DIAGNOSIS — Z951 Presence of aortocoronary bypass graft: Secondary | ICD-10-CM | POA: Diagnosis not present

## 2020-04-22 MED ORDER — COLCHICINE 0.6 MG PO TABS: 0.6000 mg | ORAL_TABLET | Freq: Every day | ORAL | 0 refills | Status: DC | PRN

## 2020-04-22 NOTE — Telephone Encounter (Signed)
Pt seen in clinic today. Colchicine refill was sent by Elenor Quinones, PA for 30 days. Pt instructed to have PCP send refills thereafter.

## 2020-04-22 NOTE — Patient Instructions (Addendum)
Medication Instructions:  Your physician recommends that you continue on your current medications as directed. Please refer to the Current Medication list given to you today.  We have sent in a 30 day refill for Colchicine. Please contact your PCP for further refills of this medication.  *If you need a refill on your cardiac medications before your next appointment, please call your pharmacy*   Lab Work: Your physician recommends that you have lab work TODAY: Bmet  If you have labs (blood work) drawn today and your tests are completely normal, you will receive your results only by: Marland Kitchen MyChart Message (if you have MyChart) OR . A paper copy in the mail If you have any lab test that is abnormal or we need to change your treatment, we will call you to review the results.   Testing/Procedures: None ordered   Follow-Up: At [facility], you and your health needs are our priority.  As part of our continuing mission to provide you with exceptional heart care, we have created designated Provider Care Teams.  These Care Teams include your primary Cardiologist (physician) and Advanced Practice Providers (APPs -  Physician Assistants and Nurse Practitioners) who all work together to provide you with the care you need, when you need it.  We recommend signing up for the patient portal called "MyChart".  Sign up information is provided on this After Visit Summary.  MyChart is used to connect with patients for Virtual Visits (Telemedicine).  Patients are able to view lab/test results, encounter notes, upcoming appointments, etc.  Non-urgent messages can be sent to your provider as well.   To learn more about what you can do with MyChart, go to [email or web address].    Your next appointment:   3 month(s)  The format for your next appointment:   In Person  Provider:   You may see Nelva Bush, MD or one of the following Advanced Practice Providers on your designated Care Team:     Murray Hodgkins, NP  Christell Faith, PA-C  Marrianne Mood, PA-C  Cadence Kathlen Mody, [place]  Laurann Montana, NP    Other Instructions  We will contact our Eliquis rep to discuss any further options to assist.  May call your insurance to see if Xarelto is preferred over Eliquis

## 2020-04-23 LAB — BASIC METABOLIC PANEL WITH GFR
BUN/Creatinine Ratio: 21 (ref 10–24)
BUN: 22 mg/dL (ref 8–27)
CO2: 23 mmol/L (ref 20–29)
Calcium: 9.1 mg/dL (ref 8.6–10.2)
Chloride: 99 mmol/L (ref 96–106)
Creatinine, Ser: 1.03 mg/dL (ref 0.76–1.27)
GFR calc Af Amer: 85 mL/min/{1.73_m2}
GFR calc non Af Amer: 73 mL/min/{1.73_m2}
Glucose: 137 mg/dL — ABNORMAL HIGH (ref 65–99)
Potassium: 4.4 mmol/L (ref 3.5–5.2)
Sodium: 139 mmol/L (ref 134–144)

## 2020-05-05 DIAGNOSIS — G720 Drug-induced myopathy: Secondary | ICD-10-CM | POA: Insufficient documentation

## 2020-05-05 DIAGNOSIS — T466X5A Adverse effect of antihyperlipidemic and antiarteriosclerotic drugs, initial encounter: Secondary | ICD-10-CM | POA: Insufficient documentation

## 2020-05-06 ENCOUNTER — Encounter (HOSPITAL_COMMUNITY): Payer: PPO | Admitting: Cardiology

## 2020-05-20 ENCOUNTER — Encounter: Payer: Self-pay | Admitting: Internal Medicine

## 2020-05-20 ENCOUNTER — Other Ambulatory Visit: Payer: Self-pay

## 2020-05-20 ENCOUNTER — Ambulatory Visit: Payer: PPO | Admitting: Internal Medicine

## 2020-05-20 VITALS — BP 158/94 | HR 63 | Ht 66.0 in | Wt 183.4 lb

## 2020-05-20 DIAGNOSIS — I9789 Other postprocedural complications and disorders of the circulatory system, not elsewhere classified: Secondary | ICD-10-CM

## 2020-05-20 DIAGNOSIS — I10 Essential (primary) hypertension: Secondary | ICD-10-CM | POA: Diagnosis not present

## 2020-05-20 DIAGNOSIS — I4891 Unspecified atrial fibrillation: Secondary | ICD-10-CM | POA: Diagnosis not present

## 2020-05-20 DIAGNOSIS — I2510 Atherosclerotic heart disease of native coronary artery without angina pectoris: Secondary | ICD-10-CM

## 2020-05-20 DIAGNOSIS — I5022 Chronic systolic (congestive) heart failure: Secondary | ICD-10-CM

## 2020-05-20 DIAGNOSIS — E785 Hyperlipidemia, unspecified: Secondary | ICD-10-CM | POA: Diagnosis not present

## 2020-05-20 MED ORDER — CARVEDILOL 12.5 MG PO TABS: 12.5000 mg | ORAL_TABLET | Freq: Two times a day (BID) | ORAL | 3 refills | Status: DC

## 2020-05-20 NOTE — Progress Notes (Unsigned)
Follow-up Outpatient Visit Date: 05/20/2020  Primary Care Provider: Glean Hess, [address] [place] [address]  Chief Complaint: Follow-up coronary artery disease and heart failure  HPI:  [name] is a 70 y.o. male with history of CAD status post PCI to LCx in 2017 in setting of NSTEMI and subsequent development of severe distal LMCA disease status post CABG in 10/2019, chronic HFrEF (mixed ischemic and nonischemic), paroxysmal atrial fibrillation, PAD, HTN, HLD, and carotid artery stenosis status post right carotid endarterectomy, who presents for follow-up of coronary artery disease and heart failure.  I last saw him in 02/2020, at which time he was feeling fairly well following his CABG last year.  Due to persistent reduction in LVEF, he underwent BiV-ICD placement with Dr. Quentin Ore later that month.  He was seen a month ago by Marrianne Mood, PA, and was doing well from a heart standpoint.  His only complaint was of pain in his left big toe that he attributed to gout.  Today, [name] reports that he is doing quite well without chest pain, shortness of breath, orthopnea, palpitations, lightheadedness, or edema.  He notes that his blood pressure today is not typical for him, with a usually being low normal.  He reports having put on about 10 pounds due to inactivity after his pacemaker/defibrillator placement.  He does not believe that this is fluid weight.  He just started going back to the gym.  He inquires about discontinuing empagliflozin due to cost.  --------------------------------------------------------------------------------------------------  Past Medical History:  Diagnosis Date  . Abnormal nuclear cardiac imaging test 08/08/2015  . Arthritis    fingers  . Carotid artery occlusion   . CHF (congestive heart failure) ([place])   . Coronary atherosclerosis of native coronary artery 01/29/2013   11/05/19 R/LHC 80% dLMCA stenosis small diffusely dz dLAD,  chronically occluded OM1 50%mRCA lesion, widely patent mLCx strent, moderately elevated L heart filling pressures, mild to moderate RH filling pressures, normal to moderately reduced CO  . Duodenal erosion   . Encounter for screening for lung cancer 07/13/2016  . Esophageal stenosis    esophageal dilation  . GERD (gastroesophageal reflux disease)   . H. pylori infection   . Heart attack [facility]) Oct. 2009   Mild  . Hiatal hernia   . Hyperlipidemia   . Hypertension   . 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 Cardiac catheterization-nonobstructive 60% RCA disease-subsequent nuclear stress test-9 minutes, low risk, mild inferior wall hypokinesis   . Pain in limb 12/19/2017  . Peripheral vascular disease ([place])   . Unstable angina ([place]) 11/25/2017   Past Surgical History:  Procedure Laterality Date  . APPENDECTOMY    . BACK SURGERY    . BIV ICD INSERTION CRT-D N/A 03/23/2020   Procedure: BIV ICD INSERTION CRT-D;  Surgeon: Vickie Epley, MD;  Location: [place] [facility];  Service: Cardiovascular;  Laterality: N/A;  . CARDIAC CATHETERIZATION N/A 08/07/2015   Procedure: Left Heart Cath and Coronary Angiography;  Surgeon: [name], MD;  Location: [facility];  Service: Cardiovascular;  Laterality: N/A;  . CARDIAC CATHETERIZATION N/A 08/07/2015   Procedure: Coronary Stent Intervention;  Surgeon: [name], MD;  Location: [place] [facility];  Service: Cardiovascular;  Laterality: N/A;  . CARDIAC CATHETERIZATION N/A 08/07/2015   Procedure: Coronary Stent Intervention;  Surgeon: [name], MD;  Location: [facility];  Service: Cardiovascular;  Laterality:  N/A;  . CAROTID ENDARTERECTOMY  01/05/2006   Right  CEA with DPA  . CATARACT EXTRACTION W/ INTRAOCULAR LENS IMPLANT Left 12/04/2017  . CATARACT EXTRACTION W/PHACO Left 12/04/2017   Procedure: CATARACT EXTRACTION PHACO AND INTRAOCULAR LENS PLACEMENT ([place]) LEFT;   Surgeon: Eulogio Bear, MD;  Location: [place];  Service: Ophthalmology;  Laterality: Left;  . CATARACT EXTRACTION W/PHACO Right 02/06/2018   Procedure: CATARACT EXTRACTION PHACO AND INTRAOCULAR LENS PLACEMENT (IOC)RIGHT;  Surgeon: Eulogio Bear, MD;  Location: [facility];  Service: Ophthalmology;  Laterality: Right;  . COLONOSCOPY  05/20/2008  . COLONOSCOPY WITH PROPOFOL N/A 09/13/2018   Procedure: COLONOSCOPY WITH BIOPSY;  Surgeon: [name], MD;  Location: [facility];  Service: Endoscopy;  Laterality: N/A;  . CORONARY ARTERY BYPASS GRAFT N/A 11/11/2019   Procedure: CORONARY ARTERY BYPASS GRAFTING (CABG) USING LIMA to Diag1; ENDOSCOPICALLY HARVESTED RIGHT GREATER SAPHENOUS VEIN: SVG to OM1; SVG to OM2; SVG to PDA.;  Surgeon: Ivin Poot, MD;  Location: [facility];  Service: Open Heart Surgery;  Laterality: N/A;  . CORONARY STENT PLACEMENT  08/07/2015   MID CIRCUMFLEX  . ENDOVEIN HARVEST OF GREATER SAPHENOUS VEIN Right 11/11/2019   Procedure: ENDOVEIN HARVEST OF GREATER SAPHENOUS VEIN;  Surgeon: Ivin Poot, MD;  Location: [place] [facility];  Service: Open Heart Surgery;  Laterality: Right;  . ESOPHAGOGASTRODUODENOSCOPY (EGD) WITH PROPOFOL N/A 02/11/2019   Procedure: ESOPHAGOGASTRODUODENOSCOPY (EGD) WITH BIOPSY and  Dilation;  Surgeon: [name], MD;  Location: [facility];  Service: Endoscopy;  Laterality: N/A;  . HIP SURGERY Left 10/2016   left hip tendon repair  . LEFT HEART CATH AND CORONARY ANGIOGRAPHY N/A 11/27/2017   Procedure: LEFT HEART CATH AND CORONARY ANGIOGRAPHY;  Surgeon: Wellington Hampshire, MD;  Location: [facility];  Service: Cardiovascular;  Laterality: N/A;  . LOWER EXTREMITY ANGIOGRAPHY Left 02/12/2018   Procedure: LOWER EXTREMITY ANGIOGRAPHY;  Surgeon: Algernon Huxley, MD;  Location: [place];  Service: Cardiovascular;  Laterality: Left;  . LOWER EXTREMITY ANGIOGRAPHY Left 03/07/2018   Procedure: LOWER EXTREMITY  ANGIOGRAPHY;  Surgeon: Algernon Huxley, MD;  Location: [place] [facility];  Service: Cardiovascular;  Laterality: Left;  . LOWER EXTREMITY ANGIOGRAPHY Left 06/04/2018   Procedure: LOWER EXTREMITY ANGIOGRAPHY;  Surgeon: Algernon Huxley, MD;  Location: [facility] [facility];  Service: Cardiovascular;  Laterality: Left;  . PLACEMENT OF IMPELLA LEFT VENTRICULAR ASSIST DEVICE N/A 11/11/2019   Procedure: PLACEMENT OF IMPELLA LEFT VENTRICULAR ASSIST DEVICE 5.5;  Surgeon: Ivin Poot, MD;  Location: [facility];  Service: Open Heart Surgery;  Laterality: N/A;  Midline Sternotomy  . POLYPECTOMY N/A 09/13/2018   Procedure: POLYPECTOMY;  Surgeon: [name], MD;  Location: [facility];  Service: Endoscopy;  Laterality: N/A;  . POLYPECTOMY N/A 02/11/2019   Procedure: POLYPECTOMY;  Surgeon: [name], MD;  Location: [facility];  Service: Endoscopy;  Laterality: N/A;  . REMOVAL OF IMPELLA LEFT VENTRICULAR ASSIST DEVICE N/A 11/15/2019   Procedure: REMOVAL OF IMPELLA 5.5 LEFT VENTRICULAR ASSIST DEVICE;  Surgeon: Ivin Poot, MD;  Location: [facility];  Service: Open Heart Surgery;  Laterality: N/A;  . RIGHT/LEFT HEART CATH AND CORONARY ANGIOGRAPHY N/A 11/05/2019   Procedure: RIGHT/LEFT HEART CATH AND CORONARY ANGIOGRAPHY;  Surgeon: Nelva Bush, MD;  Location: [facility] [facility];  Service: Cardiovascular;  Laterality: N/A;  . SPINE SURGERY    . TEE WITHOUT CARDIOVERSION N/A 11/11/2019   Procedure: TRANSESOPHAGEAL ECHOCARDIOGRAM (TEE);  Surgeon: Prescott Gum,  Collier Salina, MD;  Location: [facility];  Service: Open Heart Surgery;  Laterality: N/A;  . TEE WITHOUT CARDIOVERSION N/A 11/15/2019   Procedure: TRANSESOPHAGEAL ECHOCARDIOGRAM (TEE);  Surgeon: Prescott Gum, Collier Salina, MD;  Location: [facility];  Service: Open Heart Surgery;  Laterality: N/A;  . TONSILLECTOMY      Current Meds  Medication Sig  . Alirocumab (PRALUENT) 150 MG/ML SOAJ Inject 1 pen into the skin every 14 (fourteen) days.  Marland Kitchen apixaban (ELIQUIS) 5 MG TABS  tablet Take 1 tablet (5 mg total) by mouth 2 (two) times daily.  . carvedilol (COREG) 6.25 MG tablet Take 1.5 tablets (9.375 mg total) by mouth 2 (two) times daily.  . colchicine 0.6 MG tablet Take 1 tablet (0.6 mg total) by mouth daily as needed (Gout).  Marland Kitchen empagliflozin (JARDIANCE) 10 MG TABS tablet Take 1 tablet (10 mg total) by mouth daily before breakfast.  . nitroGLYCERIN (NITROSTAT) 0.4 MG SL tablet Place 1 tablet (0.4 mg total) under the tongue every 5 (five) minutes as needed for chest pain.  . pantoprazole (PROTONIX) 40 MG tablet Take 40 mg by mouth daily.  . sacubitril-valsartan (ENTRESTO) 49-51 MG Take 1 tablet by mouth 2 (two) times daily.  . traMADol (ULTRAM) 50 MG tablet Take 1 tablet (50 mg total) by mouth every 6 (six) hours as needed.  . traZODone (DESYREL) 50 MG tablet TAKE 1 TABLET BY MOUTH AT BEDTIME AS NEEDED FOR SLEEP.  Marland Kitchen triamcinolone cream (KENALOG) 0.1 % Apply 1 application topically 2 (two) times daily as needed for itching.    Allergies: Brilinta [ticagrelor], Chlorhexidine gluconate, Statins, and Zetia [ezetimibe]  Social History   Tobacco Use  . Smoking status: Former Smoker    Packs/day: 1.25    Years: 35.00    Pack years: 43.75    Types: Cigarettes    Quit date: 02/28/2005    Years since quitting: 15.2  . Smokeless tobacco: Current User    Types: Snuff  . Tobacco comment: occaisionally  Vaping Use  . Vaping Use: Never used  Substance Use Topics  . Alcohol use: Yes    Alcohol/week: 8.0 - 10.0 standard drinks    Types: 8 - 10 Glasses of wine per week  . Drug use: No    Family History  Problem Relation Age of Onset  . Heart attack Mother   . Coronary artery disease Mother   . Heart disease Mother        Carotid Stenosis and BPG and Heart Disease before age 48  . Diabetes Mother   . Hypertension Mother   . Heart attack Father   . Heart disease Father        BPG and Heart Disease before age 35  . Hypertension Father   . Cancer Father 55        throat  . Stroke Father   . Colon cancer Neg Hx   . Colon polyps Neg Hx   . Esophageal cancer Neg Hx   . Rectal cancer Neg Hx   . Stomach cancer Neg Hx     Review of Systems: A 12-system review of systems was performed and was negative except as noted in the HPI.  --------------------------------------------------------------------------------------------------  Physical Exam: BP (!) 158/94 (BP Location: Left Arm, Patient Position: Sitting, Cuff Size: Normal)   Pulse 63   Ht 5\' 6"  (1.676 m)   Wt 183 lb 6 oz (83.2 kg)   SpO2 96%   BMI 29.60 kg/m   General:  NAD. Neck: No JVD or  HJR. Lungs: Clear to auscultation bilaterally without wheezes or crackles. Heart: Regular rate and rhythm without murmurs, rubs, or gallops.  Median sternotomy and left chest pacemaker insertion sites well-healed. Abdomen: Soft, nontender, nondistended. Extremities: No lower extremity edema.  EKG: AV paced with isolated PVC.  Lab Results  Component Value Date   WBC 4.1 03/02/2020   HGB 14.0 03/02/2020   HCT 44.6 03/02/2020   MCV 91.0 03/02/2020   PLT 192 03/02/2020    Lab Results  Component Value Date   NA 139 04/22/2020   K 4.4 04/22/2020   CL 99 04/22/2020   CO2 23 04/22/2020   BUN 22 04/22/2020   CREATININE 1.03 04/22/2020   GLUCOSE 137 (H) 04/22/2020   ALT 16 11/18/2019    Lab Results  Component Value Date   CHOL 144 01/07/2020   HDL 62 01/07/2020   LDLCALC 50 01/07/2020   TRIG 158 (H) 01/07/2020   CHOLHDL 2.3 01/07/2020    --------------------------------------------------------------------------------------------------  ASSESSMENT AND PLAN: Coronary artery disease without angina: No chest pain reported.  Continue dairy prevention including alirocumab and carvedilol.  I will escalate carvedilol to 12.5 mg twice daily.  Defer addition of aspirin for now given that patient is on apixaban for history of atrial fibrillation.  Chronic HFrEF due to ischemic cardiomyopathy: Mr.  Green appears euvolemic with NYHA class I-II symptoms.  Given that he is now paced and hypertensive, I will use this opportunity to escalate carvedilol to 12.5 mg twice daily.  We will continue Entresto 49-51 mg twice daily.  Due to significant cost concerns, we have agreed to discontinue empagliflozin when [name] has exhausted his current supply.  He previously had hyperkalemia with spironolactone; we will defer rechallenging him with an aldosterone antagonist at this time.  Repeat echocardiogram to assess LVEF response to medical therapy and BiV pacing should be addressed when he sees Dr. Quentin Ore and Ms. Visser in May.  Postoperative atrial fibrillation: No symptoms to suggest atrial fibrillation.  I think it would be best to continue apixaban until his next device interrogation and follow-up with Dr. Quentin Ore.  I would favor discontinuing apixaban and switching to low-dose aspirin at that time if there is no evidence of recurrent atrial fibrillation.  Hypertension: Blood pressure uncharacteristically elevated today.  We will increase carvedilol to 12.5 mg twice daily and continue current dose of Entresto.  I have encouraged [name] to monitor his blood pressure at home and to continue avoidance of salt.  Hyperlipidemia: LDL well controlled on last check.  Continue alirocumab.  Follow-up: Return to clinic in May with Dr. Quentin Ore and Ms. Mickle Plumb as previously scheduled.  Nelva Bush, MD 05/20/2020 11:23 AM

## 2020-05-20 NOTE — Patient Instructions (Signed)
Medication Instructions:  Your physician has recommended you make the following change in your medication:   STOP taking Jardiance after you finish what you have at home  INCREASE Carvedilol to 12.5mg  TWICE daily - A new Rx has been sent to your pharmcy  *If you need a refill on your cardiac medications before your next appointment, please call your pharmacy*   Lab Work: None ordered  Testing/Procedures: None ordered   Follow-Up: At [facility], you and your health needs are our priority.  As part of our continuing mission to provide you with exceptional heart care, we have created designated Provider Care Teams.  These Care Teams include your primary Cardiologist (physician) and Advanced Practice Providers (APPs -  Physician Assistants and Nurse Practitioners) who all work together to provide you with the care you need, when you need it.  We recommend signing up for the patient portal called "MyChart".  Sign up information is provided on this After Visit Summary.  MyChart is used to connect with patients for Virtual Visits (Telemedicine).  Patients are able to view lab/test results, encounter notes, upcoming appointments, etc.  Non-urgent messages can be sent to your provider as well.   To learn more about what you can do with MyChart, go to [email or web address].    Your next appointment:   Follow up as scheduled with Dr. Quentin Ore and Marrianne Mood, [place] in May

## 2020-05-21 ENCOUNTER — Encounter: Payer: Self-pay | Admitting: Internal Medicine

## 2020-05-21 DIAGNOSIS — I4891 Unspecified atrial fibrillation: Secondary | ICD-10-CM | POA: Insufficient documentation

## 2020-05-21 DIAGNOSIS — I9789 Other postprocedural complications and disorders of the circulatory system, not elsewhere classified: Secondary | ICD-10-CM | POA: Insufficient documentation

## 2020-06-03 ENCOUNTER — Ambulatory Visit: Payer: PPO | Admitting: Internal Medicine

## 2020-06-07 ENCOUNTER — Other Ambulatory Visit: Payer: Self-pay | Admitting: Gastroenterology

## 2020-06-10 ENCOUNTER — Telehealth: Payer: Self-pay | Admitting: *Deleted

## 2020-06-10 NOTE — Telephone Encounter (Signed)
Completed telephone call/medical chart review for visit T20,M54 for the CLEAR research study. There are no new AE's or SAE's to report to sponsor at this time. All concomitant medications have been reviewed and updated. Next phone/chart review will be the "End of Study" which will be in June 2022.

## 2020-06-18 ENCOUNTER — Other Ambulatory Visit: Payer: Self-pay

## 2020-06-18 ENCOUNTER — Ambulatory Visit (INDEPENDENT_AMBULATORY_CARE_PROVIDER_SITE_OTHER): Payer: PPO | Admitting: Physician Assistant

## 2020-06-18 ENCOUNTER — Encounter: Payer: Self-pay | Admitting: Physician Assistant

## 2020-06-18 VITALS — BP 110/74 | HR 63 | Ht 66.0 in | Wt 186.0 lb

## 2020-06-18 DIAGNOSIS — Z7901 Long term (current) use of anticoagulants: Secondary | ICD-10-CM | POA: Diagnosis not present

## 2020-06-18 DIAGNOSIS — I5023 Acute on chronic systolic (congestive) heart failure: Secondary | ICD-10-CM | POA: Diagnosis not present

## 2020-06-18 DIAGNOSIS — I10 Essential (primary) hypertension: Secondary | ICD-10-CM

## 2020-06-18 DIAGNOSIS — I6523 Occlusion and stenosis of bilateral carotid arteries: Secondary | ICD-10-CM | POA: Diagnosis not present

## 2020-06-18 DIAGNOSIS — Z951 Presence of aortocoronary bypass graft: Secondary | ICD-10-CM | POA: Diagnosis not present

## 2020-06-18 DIAGNOSIS — I739 Peripheral vascular disease, unspecified: Secondary | ICD-10-CM

## 2020-06-18 DIAGNOSIS — I5022 Chronic systolic (congestive) heart failure: Secondary | ICD-10-CM

## 2020-06-18 DIAGNOSIS — E785 Hyperlipidemia, unspecified: Secondary | ICD-10-CM | POA: Diagnosis not present

## 2020-06-18 DIAGNOSIS — Z95810 Presence of automatic (implantable) cardiac defibrillator: Secondary | ICD-10-CM | POA: Diagnosis not present

## 2020-06-18 DIAGNOSIS — Z79899 Other long term (current) drug therapy: Secondary | ICD-10-CM

## 2020-06-18 DIAGNOSIS — I2510 Atherosclerotic heart disease of native coronary artery without angina pectoris: Secondary | ICD-10-CM

## 2020-06-18 DIAGNOSIS — Z789 Other specified health status: Secondary | ICD-10-CM

## 2020-06-18 DIAGNOSIS — I4891 Unspecified atrial fibrillation: Secondary | ICD-10-CM

## 2020-06-18 DIAGNOSIS — I9789 Other postprocedural complications and disorders of the circulatory system, not elsewhere classified: Secondary | ICD-10-CM

## 2020-06-18 MED ORDER — TORSEMIDE 10 MG PO TABS: 5.0000 mg | ORAL_TABLET | Freq: Every day | ORAL | 1 refills | Status: DC

## 2020-06-18 NOTE — Patient Instructions (Addendum)
Medication Instructions:  Your physician has recommended you make the following change in your medication:   START Torsemide 10mg  - take HALF tablet daily (5mg  daily)  *Monitor blood pressure at home for ONE WEEK and send [place] a MyChart message with your BP readings after starting Torsemide. If blood pressure ok, we will increase to whole tablet daily.  If okay, we will increase you to a whole tablet daily.  *If you need a refill on your cardiac medications before your next appointment, please call your pharmacy*   Lab Work:  1) Your physician recommends that you have lab work TODAY: BMET  2)  Your physician recommends that you return for lab work to the [facility] to your appointment with Dr. Quentin Ore on 07/01/20: BMET (this lab is not fasting) -  Please go to the [facility]. You will check in at the front desk to the right as you walk into the atrium. Valet Parking is offered if needed. - No appointment needed. You may go any day between 7 am and 6 pm.   If you have labs (blood work) drawn today and your tests are completely normal, you will receive your results only by: Marland Kitchen MyChart Message (if you have MyChart) OR . A paper copy in the mail If you have any lab test that is abnormal or we need to change your treatment, we will call you to review the results.   Testing/Procedures: None ordered   Follow-Up: At [facility] you and your health needs are our priority.  As part of our continuing mission to provide you with exceptional heart care, we have created designated Provider Care Teams.  These Care Teams include your primary Cardiologist (physician) and Advanced Practice Providers (APPs -  Physician Assistants and Nurse Practitioners) who all work together to provide you with the care you need, when you need it.  We recommend signing up for the patient portal called "MyChart".  Sign up information is provided on this After Visit Summary.  MyChart is used to connect with  patients for Virtual Visits (Telemedicine).  Patients are able to view lab/test results, encounter notes, upcoming appointments, etc.  Non-urgent messages can be sent to your provider as well.   To learn more about what you can do with MyChart, go to [email or web address].    Your next appointment:    Follow up as scheduled with Dr. Quentin Ore and Marrianne Mood, PA

## 2020-06-18 NOTE — Progress Notes (Signed)
Office Visit    Patient Name: [name] Date of Encounter: 06/18/2020  PCP:  Glean Hess, MD   [place]  Cardiologist:  Nelva Bush, MD  Advanced Practice Provider:  No care team member to display Electrophysiologist:  Vickie Epley, MD   Chief Complaint    Chief Complaint  Patient presents with  . office visit-Patient c/o weight gain/bloating; denies SOB    71 yo male with history of CAD with NSTEMI 07/2015 s/p PCI to LCx s/p CABG x4 10/2019 for development of severe distal LMCA disease,chronic HFrEF (mixed ICM/NICM) and known LBBB s/p 02/2020 CRT-D implantation (St. Jude) for ICM, paroxysmal atrial fibrillation on Eliquis, carotid disease s/p R carotid endarterectomy, PAD s/p intervention and stent of left SFA 05/2018, statin intolerance on Praluent, HTN, HLD, hiatal hernia, previous tobacco use, PUD, and here for last-minute symptoms due to weight gain, abdominal distention, and shortness of breath/dyspnea  Past Medical History    Past Medical History:  Diagnosis Date  . Abnormal nuclear cardiac imaging test 08/08/2015  . Arthritis    fingers  . Carotid artery occlusion   . CHF (congestive heart failure) ([place])   . Coronary atherosclerosis of native coronary artery 01/29/2013   11/05/19 R/LHC 80% dLMCA stenosis small diffusely dz dLAD, chronically occluded OM1 50%mRCA lesion, widely patent mLCx strent, moderately elevated L heart filling pressures, mild to moderate RH filling pressures, normal to moderately reduced CO  . Duodenal erosion   . Encounter for screening for lung cancer 07/13/2016  . Esophageal stenosis    esophageal dilation  . GERD (gastroesophageal reflux disease)   . H. pylori infection   . Heart attack [facility]) Oct. 2009   Mild  . Hiatal hernia   . Hyperlipidemia   . Hypertension   . 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 Cardiac catheterization-nonobstructive  60% RCA disease-subsequent nuclear stress test-9 minutes, low risk, mild inferior wall hypokinesis   . Pain in limb 12/19/2017  . Peripheral vascular disease ([place])   . Unstable angina ([place]) 11/25/2017   Past Surgical History:  Procedure Laterality Date  . APPENDECTOMY    . BACK SURGERY    . BIV ICD INSERTION CRT-D N/A 03/23/2020   Procedure: BIV ICD INSERTION CRT-D;  Surgeon: Vickie Epley, MD;  Location: [place] [facility];  Service: Cardiovascular;  Laterality: N/A;  . CARDIAC CATHETERIZATION N/A 08/07/2015   Procedure: Left Heart Cath and Coronary Angiography;  Surgeon: [name], MD;  Location: [place] [facility];  Service: Cardiovascular;  Laterality: N/A;  . CARDIAC CATHETERIZATION N/A 08/07/2015   Procedure: Coronary Stent Intervention;  Surgeon: [name], MD;  Location: [place] [facility];  Service: Cardiovascular;  Laterality: N/A;  . CARDIAC CATHETERIZATION N/A 08/07/2015   Procedure: Coronary Stent Intervention;  Surgeon: [name], MD;  Location: [place] [facility];  Service: Cardiovascular;  Laterality: N/A;  . CAROTID ENDARTERECTOMY  01/05/2006   Right  CEA with DPA  . CATARACT EXTRACTION W/ INTRAOCULAR LENS IMPLANT Left 12/04/2017  . CATARACT EXTRACTION W/PHACO Left 12/04/2017   Procedure: CATARACT EXTRACTION PHACO AND INTRAOCULAR LENS PLACEMENT ([place]) LEFT;  Surgeon: Eulogio Bear, MD;  Location: [place];  Service: Ophthalmology;  Laterality: Left;  . CATARACT EXTRACTION W/PHACO Right 02/06/2018   Procedure: CATARACT EXTRACTION PHACO AND INTRAOCULAR LENS PLACEMENT (IOC)RIGHT;  Surgeon: Eulogio Bear, MD;  Location: [place];  Service: Ophthalmology;  Laterality: Right;  . COLONOSCOPY  05/20/2008  . COLONOSCOPY WITH PROPOFOL N/A 09/13/2018   Procedure: COLONOSCOPY WITH BIOPSY;  Surgeon: [name], MD;  Location: [place];  Service: Endoscopy;  Laterality: N/A;  . CORONARY ARTERY BYPASS GRAFT N/A 11/11/2019   Procedure:  CORONARY ARTERY BYPASS GRAFTING (CABG) USING LIMA to Diag1; ENDOSCOPICALLY HARVESTED RIGHT GREATER SAPHENOUS VEIN: SVG to OM1; SVG to OM2; SVG to PDA.;  Surgeon: Ivin Poot, MD;  Location: [place];  Service: Open Heart Surgery;  Laterality: N/A;  . CORONARY STENT PLACEMENT  08/07/2015   MID CIRCUMFLEX  . ENDOVEIN HARVEST OF GREATER SAPHENOUS VEIN Right 11/11/2019   Procedure: ENDOVEIN HARVEST OF GREATER SAPHENOUS VEIN;  Surgeon: Ivin Poot, MD;  Location: [place];  Service: Open Heart Surgery;  Laterality: Right;  . ESOPHAGOGASTRODUODENOSCOPY (EGD) WITH PROPOFOL N/A 02/11/2019   Procedure: ESOPHAGOGASTRODUODENOSCOPY (EGD) WITH BIOPSY and  Dilation;  Surgeon: [name], MD;  Location: [place];  Service: Endoscopy;  Laterality: N/A;  . HIP SURGERY Left 10/2016   left hip tendon repair  . LEFT HEART CATH AND CORONARY ANGIOGRAPHY N/A 11/27/2017   Procedure: LEFT HEART CATH AND CORONARY ANGIOGRAPHY;  Surgeon: Wellington Hampshire, MD;  Location: [place] [facility];  Service: Cardiovascular;  Laterality: N/A;  . LOWER EXTREMITY ANGIOGRAPHY Left 02/12/2018   Procedure: LOWER EXTREMITY ANGIOGRAPHY;  Surgeon: Algernon Huxley, MD;  Location: [place] [facility];  Service: Cardiovascular;  Laterality: Left;  . LOWER EXTREMITY ANGIOGRAPHY Left 03/07/2018   Procedure: LOWER EXTREMITY ANGIOGRAPHY;  Surgeon: Algernon Huxley, MD;  Location: [place] [facility];  Service: Cardiovascular;  Laterality: Left;  . LOWER EXTREMITY ANGIOGRAPHY Left 06/04/2018   Procedure: LOWER EXTREMITY ANGIOGRAPHY;  Surgeon: Algernon Huxley, MD;  Location: [place] [facility];  Service: Cardiovascular;  Laterality: Left;  . PLACEMENT OF IMPELLA LEFT VENTRICULAR ASSIST DEVICE N/A 11/11/2019   Procedure: PLACEMENT OF IMPELLA LEFT VENTRICULAR ASSIST DEVICE 5.5;  Surgeon: Ivin Poot, MD;  Location: [place];  Service: Open Heart Surgery;  Laterality: N/A;  Midline Sternotomy  . POLYPECTOMY N/A 09/13/2018   Procedure:  POLYPECTOMY;  Surgeon: [name], MD;  Location: [place];  Service: Endoscopy;  Laterality: N/A;  . POLYPECTOMY N/A 02/11/2019   Procedure: POLYPECTOMY;  Surgeon: [name], MD;  Location: [place];  Service: Endoscopy;  Laterality: N/A;  . REMOVAL OF IMPELLA LEFT VENTRICULAR ASSIST DEVICE N/A 11/15/2019   Procedure: REMOVAL OF IMPELLA 5.5 LEFT VENTRICULAR ASSIST DEVICE;  Surgeon: Ivin Poot, MD;  Location: [place];  Service: Open Heart Surgery;  Laterality: N/A;  . RIGHT/LEFT HEART CATH AND CORONARY ANGIOGRAPHY N/A 11/05/2019   Procedure: RIGHT/LEFT HEART CATH AND CORONARY ANGIOGRAPHY;  Surgeon: Nelva Bush, MD;  Location: [place] [facility];  Service: Cardiovascular;  Laterality: N/A;  . SPINE SURGERY    . TEE WITHOUT CARDIOVERSION N/A 11/11/2019   Procedure: TRANSESOPHAGEAL ECHOCARDIOGRAM (TEE);  Surgeon: Prescott Gum, Collier Salina, MD;  Location: [place];  Service: Open Heart Surgery;  Laterality: N/A;  . TEE WITHOUT CARDIOVERSION N/A 11/15/2019   Procedure: TRANSESOPHAGEAL ECHOCARDIOGRAM (TEE);  Surgeon: Prescott Gum, Collier Salina, MD;  Location: [place];  Service: Open Heart Surgery;  Laterality: N/A;  . TONSILLECTOMY      Allergies  Allergies  Allergen Reactions  . Brilinta [Ticagrelor] Shortness Of Breath  . Chlorhexidine Gluconate Other (See Comments)    Skin burning for hours afterward  . Statins Other (See Comments)    Failed Crestor 5  mg twice weekly, Crestor 20 mg daily, Pravastatin 40 mg qd, Lipitor, Zocor - muscle aches  . Zetia [Ezetimibe] Other (See Comments)    Muscle aches    History of Present Illness    BEECHER FURIO is a 70 y.o. male with PMH as above.   He has history of PAD and underwent mechanical thrombectomy and cathter thrombolysis to the L SFA in early 05/2018 and reported minimal leg pain at that time.  He is s/p right CEA with carotid Dopplers 10/2019 showing 40 to 44% LICA.  He is intolerant to statins and on Praluent.  He has history of CAD  s/p 2017 non-STEMI and subsequent cath with PCI to LCx 2017.  He underwent repeat LHC 10/2017 with widely patent LCx stent without significant restenosis and otherwise mild to moderate CAD, EF 40-45%, LVEDP 5mmg.  Echo EF 45-50%. Repeat limited echo 01/2018 EF 40-45%.  Medical management advised.  Seen in clinic 09/2019 with DOE and abnormal EKG. Subsequent outpatient [facility] 11/05/2019 showed multivessel CAD with development of severe distal LMCA disease.  Echo EF 25 to 30%, LV severely dilated, mild MR. He was transferred to [facility] for CABG x4 (LIMA-LAD, SVG-OM1, SVG-OM 2, SVG-PDA) with placement of Impella 5.5 on 11/11/2019 (removed POD4). He had postoperative atrial fibrillation with NSR restored once started on IV amiodarone and transitioned to oral amiodarone and Eliquis at discharge. He was IV diuresed and transitioned to p.o. Lasix 40 mg daily at discharge.    When seen by the heart failure clinic 03/02/2020, he was riding his exercise bike / lifting weights 3 times per week.  He was golfing 3-4 times per week. He noted DOE only with heavy exertion.  He was using Lasix on a PRN basis and euvolemic. He remained in NSR with PVCs and known LBBB.  He was no longer taking amiodarone and off of digoxin, due to elevated level and nausea.  Same day echo with EF 30 to 35%, global hypokinesis with septal-lateral dyssynchrony due to LBBB, reduced RV SF, mild LVH, G1 DD.  Given his persistently low EF and LBBB with prominent septal-lateral dyssynchrony on echo, recommendation was for CRT-D implant.    He was seen prior to CRT implantation by his primary cardiologist, Dr. Harrell Gave End, and reported almost complete resolution of chest wall soreness following his CABG with minimal DOE.  He was concerned his heart failure medications were making him feel worse than before his CABG. [place] to elevated K+, spironolactone was discontinued and Entresto held then restarted the evening of 1/22 after repeat labs showed K+ returned  to normal range.  It was noted creatinine was still slightly above baseline with recommendation to drink extra water.  Recommendation was for repeat BMP at the time of his CRT-D placement or on Tuesday prior to discharge home.  This does not appear to have yet been collected.  He underwent St. Jude CRT-D implant 03/23/2020 by Dr. Quentin Ore without complications.  Subsequent wound check appointment 2/3 without erythema or edema and Steri-Strips removed.  Device interrogation 2/3 without significant findings.  Seen 04/22/2020, when he was doing well from a cardiac standpoint.  He was excited to return to golf in the near future.  He reported gradual weight gain, attributed to continue lifestyle with restrictions for CRT-D implantation.  He wanted to consolidate his physicians to Dr. Quentin Ore and Dr. Saunders Revel.  He reported to gout with colchicine refill provided.  Eliquis representative provided given cost of his medication.  Patient reported he  would also contact Xarelto to see if preferred over Eliquis.  On discussion with patient today, RRT has since did try to get financial help regarding anticoagulation has not made any headway at this point.   Seen 05/20/2020.  He was doing well from a cardiovascular standpoint.  He noted BP associated with visit was not typical for him.  He expressed frustration regarding putting on approximately 10 pounds due to inactivity after his pacemaker/defibrillator placement.  He did not feel as if he was holding onto fluid.  He noted at times his pants were difficult to put on and zip/passing.  He was going back to the gym.  He inquired regarding discontinuing Wilder Glade due to cost with decision to discontinue the medication at that time.  He was continued on ASA, Eliquis and, and Coreg.  Escalation of carvedilol was recommended to 12.5 mg twice daily.  ASA 81 mg was deferred for now, given apixaban and history of atrial fibrillation.  It was noted that, when seeing Dr. Quentin Ore and myself for  follow-up, repeat echo could be ordered to to reassess LVEF and response to medical therapy and biventricular pacing/ should be addressed at the time.  He was continued on apixaban until his next device interrogation, at which time it was recommended per primary cardiologist that he discontinue apixaban and switch to low-dose ASA 81 mg daily thereafter, given there was no evidence of recurrent atrial fibrillation.  He was encouraged to monitor his blood pressure at home, as well as salt/fluid restrictions reviewed.    Today, 06/18/2020, he returns to clinic and notes that he has overall not been doing as well from cardiac standpoint.  No chest pain at rest or with exertion.  No shortness of breath or dyspnea.  No presyncope or syncope.  No early satiety.  He notes elevated pressures and feeling as if he is holding onto fluid or driving with low motivation and low energy.  He reports between 10 to 15 pound increase.  Weight is between clinic scales compared.  Patient has been urinating 4-5 times per day during golf with total daily the intake described as 2 cups of coffee in the morning and a Diet Coke in the evening, as well as 2 glasses of wine most nights and a glass of iced tea.  Breakfast typically is bacon and eggs.  He denies any current or recent chest pain or dyspnea/shortness of breath with exertion.  He has been helping out with jobs, given short staffed status.  Of note, per primary cardiologist, it was recommended that at his next visit with Dr. Quentin Ore, he should discuss if apixaban should be discontinued and patient switch to low-dose ASA daily thereafter without evidence of atrial fibrillation for some time.  He is encouraged to monitor his blood pressure and heart rate at home.  Reds vest 37%, consistent with volume overload.  No signs or symptoms of bleeding.  He does report some discomfort while coughing, as he has had to adjust his golf swing to be device pocket, but he is slowly adjusting.    Home Medications    Current Outpatient Medications on File Prior to Visit  Medication Sig Dispense Refill  . Alirocumab (PRALUENT) 150 MG/ML SOAJ Inject 1 pen into the skin every 14 (fourteen) days. 2 mL 11  . apixaban (ELIQUIS) 5 MG TABS tablet Take 1 tablet (5 mg total) by mouth 2 (two) times daily. 180 tablet 3  . carvedilol (COREG) 12.5 MG tablet Take 1 tablet (12.5 mg  total) by mouth 2 (two) times daily. 180 tablet 3  . colchicine 0.6 MG tablet Take 1 tablet (0.6 mg total) by mouth daily as needed (Gout). 30 tablet 0  . nitroGLYCERIN (NITROSTAT) 0.4 MG SL tablet Place 1 tablet (0.4 mg total) under the tongue every 5 (five) minutes as needed for chest pain. 25 tablet 2  . pantoprazole (PROTONIX) 40 MG tablet TAKE 1 TABLET (40 MG TOTAL) BY MOUTH DAILY. **PLEASE SCHEDULE FOLLOW UP APPT** 30 tablet 0  . sacubitril-valsartan (ENTRESTO) 49-51 MG Take 1 tablet by mouth 2 (two) times daily. 180 tablet 3  . traMADol (ULTRAM) 50 MG tablet Take 1 tablet (50 mg total) by mouth every 6 (six) hours as needed. 40 tablet 0  . traZODone (DESYREL) 50 MG tablet TAKE 1 TABLET BY MOUTH AT BEDTIME AS NEEDED FOR SLEEP. 90 tablet 1  . triamcinolone cream (KENALOG) 0.1 % Apply 1 application topically 2 (two) times daily as needed for itching.     No current facility-administered medications on file prior to visit.    Review of Systems    He reports fatigue and low motivation.  He denies chest pain, palpitations, dyspnea, pnd, orthopnea, n, v, dizziness, syncope, edema, or early satiety. He reports gout in his L big toe.  He reports weight gain/gradual weight gain were previously attributed to sedentary lifestyle but now attributed to fluid Na/L restrictions on salt / fluids and activity.  All other systems reviewed and are otherwise negative except as noted above.  Physical Exam    VS:  BP 110/74 (BP Location: Left Arm, Patient Position: Sitting, Cuff Size: Large)   Pulse 63   Ht 5\' 6"  (1.676 m)   Wt 186  lb (84.4 kg)   SpO2 96%   BMI 30.02 kg/m  , BMI Body mass index is 30.02 kg/m. GEN: Well nourished, well developed, in no acute distress.  Facemask in place. HEENT: normal. Neck: Supple, no JVD, carotid bruits, or masses. Cardiac: RRR, no murmurs, rubs, or gallops. No clubbing, cyanosis.  Radials/DP/PT 2+ and equal bilaterally.  Per patient report, pacemaker pocket incision healing well, though uncomfortable during a golf swing. Respiratory:  Respirations regular and unlabored, bilaterally reduced breath sounds but otherwise clear to auscultation bilaterally. GI: Soft, nontender, nondistended, BS + x 4. MS: no deformity or atrophy. Skin: warm and dry, no rash. Neuro:  Strength and sensation are intact. Psych: Normal affect.  Accessory Clinical Findings    ECG personally reviewed by me today -no EKG.  VITALS Reviewed today   Temp Readings from Last 3 Encounters:  03/23/20 98.1 F (36.7 C) (Oral)  01/29/20 97.6 F (36.4 C) (Skin)  12/18/19 97.6 F (36.4 C) (Skin)   BP Readings from Last 3 Encounters:  06/18/20 110/74  05/20/20 (!) 158/94  04/22/20 100/60   Pulse Readings from Last 3 Encounters:  06/18/20 63  05/20/20 63  04/22/20 64    Wt Readings from Last 3 Encounters:  06/18/20 186 lb (84.4 kg)  05/20/20 183 lb 6 oz (83.2 kg)  04/22/20 180 lb 6 oz (81.8 kg)     LABS  reviewed today   Lab Results  Component Value Date   WBC 4.1 03/02/2020   HGB 14.0 03/02/2020   HCT 44.6 03/02/2020   MCV 91.0 03/02/2020   PLT 192 03/02/2020   Lab Results  Component Value Date   CREATININE 1.03 04/22/2020   BUN 22 04/22/2020   NA 139 04/22/2020   K 4.4 04/22/2020   CL  99 04/22/2020   CO2 23 04/22/2020   Lab Results  Component Value Date   ALT 16 11/18/2019   AST 14 (L) 11/18/2019   ALKPHOS 50 11/18/2019   BILITOT 0.8 11/18/2019   Lab Results  Component Value Date   CHOL 144 01/07/2020   HDL 62 01/07/2020   LDLCALC 50 01/07/2020   TRIG 158 (H) 01/07/2020    CHOLHDL 2.3 01/07/2020    Lab Results  Component Value Date   HGBA1C 5.6 11/07/2019   Lab Results  Component Value Date   TSH 3.303 11/07/2019     STUDIES/PROCEDURES reviewed today   [facility] 11/05/19 Conclusions: 1. Multivessel CAD, including calcified 80% distal LMCA stenosis, small diffusely diseased distal LAD, chronically occluded OM1, and 50% mid RCA lesion. 2. Widely patent mid LCx stent. 3. Moderately elevated left heart filling pressure. 4. Mildly to moderately elevated right heart filling pressure. 5. Normal to mildly reduced cardiac output/index. Recommendations: 1. Transfer to [facility] for cardiac surgery consultation for CABG, given severe LMCA disease and low LVEF. 2. Hold clopidogrel pending cardiac surgery consultation. 3. Obtain echocardiogram. 4. Aggressive secondary prevention.  US Carotid and extremities 11/07/19 Right Carotid: Velocities in the right ICA are consistent with a 1-39%  stenosis.  Left Carotid: Velocities in the left ICA are consistent with a 40-59%  stenosis.  Vertebrals: Bilateral vertebral arteries demonstrate antegrade flow.  Subclavians: Normal flow hemodynamics were seen in bilateral subclavian        arteries.  Right ABI: Resting right ankle-brachial index indicates mild right lower  extremity arterial disease. The right toe-brachial index is abnormal.  Left ABI: Resting left ankle-brachial index indicates moderate left lower  extremity arterial disease. The left toe-brachial index is abnormal.  Right Upper Extremity: Doppler waveform obliterate with right radial  compression. Doppler waveforms remain within normal limits with right  ulnar compression.  Left Upper Extremity: Doppler waveform obliterate with left radial  compression. Doppler waveforms remain within normal limits with left ulnar  compression.   Echo 03/02/20 1. Left ventricular ejection fraction, by estimation, is 30 to 35%. The  left ventricle has moderately  decreased function. The left ventricle  demonstrates global hypokinesis with septal-lateral dyssynchrony due to  LBBB. There is mild left ventricular  hypertrophy. Left ventricular diastolic parameters are consistent with  Grade I diastolic dysfunction (impaired relaxation).  2. The mitral valve is normal in structure. Trivial mitral valve  regurgitation. No evidence of mitral stenosis.  3. The aortic valve is tricuspid. Aortic valve regurgitation is trivial.  No aortic stenosis is present.  4. Right ventricular systolic function is mildly reduced. The right  ventricular size is normal. Tricuspid regurgitation signal is inadequate  for assessing PA pressure.  5. The inferior vena cava is normal in size with greater than 50%  respiratory variability, suggesting right atrial pressure of 3 mmHg.   Assessment & Plan    Coronary artery disease s/p CABG x4 (10/2019)  --No angina reported.  Recover well from CABG 10/2019.  Continue ASA and carvedilol.  Continue Eliquis in lieu of ASA.  Continue PPI to protect the stomach.  Sublingual nitro as needed for chest pain.  Continue Praluent for aggressive risk factor modification.   Chronic HFrEF (EF 30-35%, 02/2020) S/p CRT-D implantation (St Jude, 02/2020) History of hyperkalemia --Volume up exam with report of gradual signs /symptoms of volume overload.  ReDS vest 37%. Ss/p 02/2020 CRT-D implantation (St. Jude) for ICM and ongoing reduced EF 30-35% and LBBB.  Weight gain likely  2/2 fluid.  We will repeat labs today, given history of hyperkalemia.  He remains off of spironolactone.  Continue current beta-blocker and Entresto 49-51 mg twice daily.  Further recommendations, if indicated, following repeat BMET.  Continue to follow-up with the EP.  Continue an active lifestyle as tolerated.  Limit salt and fluids as previously described.  Postoperative atrial fibrillation Chronic anticoagulation -- Regular rate and rhythm on exam.  NSR by EKG today.  No  signs or symptoms of bleeding on previously reported Eliquis 5 mg, which was actually only taken once per day.  This dosing has since been corrected with future dosing to reflect Eliquis twice daily.  Pt does report some difficulty with affording this medication.  We will contact the Eliquis representative today and call back the patient with further recommendations.  Samples and Eliquis coupon provided in the office for assistance.  Financial assistance form provided.  Continue Eliquis and carvedilol.  Hyperlipidemia, LDL goal below 70 Statin intolerance --Continue Praluent.  LDL goal below 70.  12/2019 LDL 50.  Hypertension, goal BP 130/80 or lower --BP well controlled to soft as on prior visits.  Currently 110/74.  Continue current medications.  PAD -History of PAD. S/p bilateral lower extremity interventions by Dr. Lucky Cowboy.  Denies any symptoms of worsening disease today.  No reported claudication / worsening pain in lower extremities or edema on exam.   Most recent 10/2019 studies as above show right ABI indicates mild right lower extremity arterial disease with right TBI abnormal; left ABI indicates moderate left lower extremity arterial disease with left TBI abnormal.  Continue to monitor with annual studies. Risk factor modification with HR, BP, and cholesterol control. Continue Elqiuis and Praluent.   Carotid artery disease --S/p right carotid endarterectomy ectomy with history of carotid disease.  No report of dizziness/presyncope or amaurosis fugax.  %. No bruit on exam. Most recent 10/2019 carotid studies as above.  Studies show RICA 1 to 42%H, LICA 40 to 06%C.  Risk factor modification with HR/BP/cholesterol control.  Continue current Eliquis and Praluent.  Continue annual studies to monitor.  Gout --Reports significant symptoms of left big toe gout.  We will provide a prescription for colchicine 0.6 mg daily as needed to patch him through until his next PCP visit.  Further refills per  PCP.  Medication changes: Start torsemide 10 mg daily.  Please take one half torsemide 10 mg daily (5 mg daily) until we are able to reassess your blood pressure.  Your blood pressure is consistently soft, and we want to ensure that we do not cause a syncopal event.  Please send a MyChart message with your blood pressure readings after 1 week of torsemide.  If BP readings acceptable, we will increase to Toprol tablet daily.  We will contact the Eliquis representative regarding the cost of this medication, as it frequently changes depending on insurance coverage.Marland Kitchen  He will contact his insurance to see if Xarelto versus Eliquis is preferred over that of Eliquis.  If needed, we could provide medication assistance forms.  Will defer for now. ReDS 37% Labs ordered: BMET to reassess K+, given history of hyperkalemia. Studies / Imaging ordered: None Future considerations: None Disposition: RTC as scheduled with Dr. Quentin Ore and myself   Arvil Chaco, PA-C 06/18/2020

## 2020-06-19 ENCOUNTER — Telehealth: Payer: Self-pay | Admitting: *Deleted

## 2020-06-19 LAB — BASIC METABOLIC PANEL
BUN/Creatinine Ratio: 20 (ref 10–24)
BUN: 25 mg/dL (ref 8–27)
CO2: 21 mmol/L (ref 20–29)
Calcium: 8.8 mg/dL (ref 8.6–10.2)
Chloride: 102 mmol/L (ref 96–106)
Creatinine, Ser: 1.26 mg/dL (ref 0.76–1.27)
Glucose: 102 mg/dL — ABNORMAL HIGH (ref 65–99)
Potassium: 4.7 mmol/L (ref 3.5–5.2)
Sodium: 139 mmol/L (ref 134–144)
eGFR: 61 mL/min/{1.73_m2} (ref >59)

## 2020-06-19 NOTE — Telephone Encounter (Signed)
Attempted to call pt, no answer. Lmtcb.

## 2020-06-19 NOTE — Telephone Encounter (Signed)
-----   Message from Arvil Chaco, PA-C sent at 06/19/2020  1:15 PM EDT ----- Baseline labs for start of patient's torsemide.  Please make sure he drinks at least some water while we get the fluid off of him, so we keep his kidneys happy (we also discussed this in clinic and that it should be under 2L per day with salt under 2g).  If he noticed improvement in his symptoms after his first dose of torsemide, we will continue him on his current dose.  Let's just plan to recheck his kidneys again around Wednesday of next week to air on the safe side.

## 2020-06-22 ENCOUNTER — Telehealth: Payer: Self-pay | Admitting: Physician Assistant

## 2020-06-22 MED ORDER — TORSEMIDE 10 MG PO TABS: 10.0000 mg | ORAL_TABLET | Freq: Every day | ORAL | 5 refills | Status: DC

## 2020-06-22 NOTE — Telephone Encounter (Signed)
Spoke to pt. Notified I have sent refills for Torsemide 10mg  daily to [facility] in [place].  Pt appreciative and voiced understanding. No further needs at this time.

## 2020-06-22 NOTE — Telephone Encounter (Signed)
Call placed to patient regarding his output following initiation of torsemide 5 mg daily.  He reports that his urine output has not changed and his weight remains stable.  He denies any presyncope or dizziness.  BP has remained above 110.  After ROS via telephone, we will plan to increase to torsemide 10 mg daily.  He does note only receiving 12 pills in his recent prescription.  He may require an additional prescription to be sent to his pharmacy to patch him through until his next visit.  At the time of our conversation, he is not at home, and therefore uncertain of the amount of pills he had remaining (suspect he has only used ~2 pills).  He plans for a follow-up BMET before RTC as previously planned.  He will send a MyChart message in the next few days to update [place] on his output following increase to torsemide 10 mg daily.  Jinny Blossom, are we able to send in additional torsemide to ensure he has enough until next seen in clinic?

## 2020-06-23 ENCOUNTER — Ambulatory Visit (INDEPENDENT_AMBULATORY_CARE_PROVIDER_SITE_OTHER): Payer: PPO

## 2020-06-23 DIAGNOSIS — I428 Other cardiomyopathies: Secondary | ICD-10-CM | POA: Diagnosis not present

## 2020-06-23 NOTE — Telephone Encounter (Signed)
Pt has been notified of results and provider's recc.  See telephone note 4/25. Lab orders in place.  Pt aware to repeat BMET next week.

## 2020-06-29 ENCOUNTER — Encounter: Payer: Self-pay | Admitting: Internal Medicine

## 2020-06-29 ENCOUNTER — Other Ambulatory Visit: Payer: Self-pay | Admitting: Physician Assistant

## 2020-06-29 DIAGNOSIS — Z79899 Other long term (current) drug therapy: Secondary | ICD-10-CM | POA: Diagnosis not present

## 2020-06-29 DIAGNOSIS — I5022 Chronic systolic (congestive) heart failure: Secondary | ICD-10-CM | POA: Diagnosis not present

## 2020-06-29 DIAGNOSIS — I10 Essential (primary) hypertension: Secondary | ICD-10-CM | POA: Diagnosis not present

## 2020-06-29 DIAGNOSIS — I2510 Atherosclerotic heart disease of native coronary artery without angina pectoris: Secondary | ICD-10-CM | POA: Diagnosis not present

## 2020-06-30 ENCOUNTER — Telehealth: Payer: Self-pay | Admitting: *Deleted

## 2020-06-30 DIAGNOSIS — I2510 Atherosclerotic heart disease of native coronary artery without angina pectoris: Secondary | ICD-10-CM

## 2020-06-30 LAB — CUP PACEART REMOTE DEVICE CHECK
Battery Remaining Longevity: 61 mo
Battery Remaining Percentage: 94 %
Battery Voltage: 3.01 V
Brady Statistic AP VP Percent: 36 %
Brady Statistic AP VS Percent: 1 %
Brady Statistic AS VP Percent: 48 %
Brady Statistic AS VS Percent: 4.2 %
Brady Statistic RA Percent Paced: 23 %
Date Time Interrogation Session: 20220429162610
HighPow Impedance: 61 Ohm
Implantable Lead Implant Date: 20220124
Implantable Lead Implant Date: 20220124
Implantable Lead Implant Date: 20220124
Implantable Lead Location: 753858
Implantable Lead Location: 753859
Implantable Lead Location: 753860
Implantable Pulse Generator Implant Date: 20220124
Lead Channel Impedance Value: 460 Ohm
Lead Channel Impedance Value: 490 Ohm
Lead Channel Impedance Value: 500 Ohm
Lead Channel Pacing Threshold Amplitude: 0.625 V
Lead Channel Pacing Threshold Amplitude: 0.75 V
Lead Channel Pacing Threshold Amplitude: 1.125 V
Lead Channel Pacing Threshold Pulse Width: 0.5 ms
Lead Channel Pacing Threshold Pulse Width: 0.5 ms
Lead Channel Pacing Threshold Pulse Width: 0.5 ms
Lead Channel Sensing Intrinsic Amplitude: 12 mV
Lead Channel Sensing Intrinsic Amplitude: 2.8 mV
Lead Channel Setting Pacing Amplitude: 1.625
Lead Channel Setting Pacing Amplitude: 2.125
Lead Channel Setting Pacing Amplitude: 3.5 V
Lead Channel Setting Pacing Pulse Width: 0.5 ms
Lead Channel Setting Pacing Pulse Width: 0.5 ms
Lead Channel Setting Sensing Sensitivity: 0.5 mV
Pulse Gen Serial Number: 810017138

## 2020-06-30 LAB — BASIC METABOLIC PANEL
BUN/Creatinine Ratio: 28 — ABNORMAL HIGH (ref 10–24)
BUN: 42 mg/dL — ABNORMAL HIGH (ref 8–27)
CO2: 25 mmol/L (ref 20–29)
Calcium: 9.4 mg/dL (ref 8.6–10.2)
Chloride: 100 mmol/L (ref 96–106)
Creatinine, Ser: 1.5 mg/dL — ABNORMAL HIGH (ref 0.76–1.27)
Glucose: 102 mg/dL — ABNORMAL HIGH (ref 65–99)
Potassium: 5.4 mmol/L — ABNORMAL HIGH (ref 3.5–5.2)
Sodium: 141 mmol/L (ref 134–144)
eGFR: 50 mL/min/{1.73_m2} — ABNORMAL LOW (ref >59)

## 2020-06-30 NOTE — Telephone Encounter (Signed)
Spoke with patient and reviewed recommendations to hold his torsemide until his next visit (07/24/20) with Marrianne Mood PA-C per her request. Also discussed need for repeat labs to be done in one week. He requested to do them at our [facility] location. Order entered for this and will also update APP that patient also has appointment tomorrow with Dr. Quentin Ore. He verbalized understanding of instructions, agreement with plan, and had no further questions at this time.

## 2020-06-30 NOTE — Telephone Encounter (Signed)
-----   Message from Arvil Chaco, PA-C sent at 06/30/2020 12:36 PM EDT ----- We need a repeat BMET within the next week please.

## 2020-06-30 NOTE — Telephone Encounter (Signed)
-----   Message from Arvil Chaco, PA-C sent at 06/30/2020 12:35 PM EDT ----- Please call the patient and instruct him to hold his Torsemide until next seen in office.

## 2020-07-01 ENCOUNTER — Other Ambulatory Visit: Payer: Self-pay

## 2020-07-01 ENCOUNTER — Ambulatory Visit (INDEPENDENT_AMBULATORY_CARE_PROVIDER_SITE_OTHER): Payer: PPO | Admitting: Cardiology

## 2020-07-01 ENCOUNTER — Encounter: Payer: Self-pay | Admitting: Cardiology

## 2020-07-01 VITALS — BP 132/80 | HR 64 | Ht 66.0 in | Wt 183.6 lb

## 2020-07-01 DIAGNOSIS — I255 Ischemic cardiomyopathy: Secondary | ICD-10-CM | POA: Diagnosis not present

## 2020-07-01 DIAGNOSIS — I447 Left bundle-branch block, unspecified: Secondary | ICD-10-CM

## 2020-07-01 DIAGNOSIS — I5022 Chronic systolic (congestive) heart failure: Secondary | ICD-10-CM

## 2020-07-01 DIAGNOSIS — I493 Ventricular premature depolarization: Secondary | ICD-10-CM | POA: Diagnosis not present

## 2020-07-01 DIAGNOSIS — Z95810 Presence of automatic (implantable) cardiac defibrillator: Secondary | ICD-10-CM

## 2020-07-01 DIAGNOSIS — Z79899 Other long term (current) drug therapy: Secondary | ICD-10-CM | POA: Diagnosis not present

## 2020-07-01 DIAGNOSIS — I2510 Atherosclerotic heart disease of native coronary artery without angina pectoris: Secondary | ICD-10-CM | POA: Diagnosis not present

## 2020-07-01 LAB — PACEMAKER DEVICE OBSERVATION

## 2020-07-01 MED ORDER — AMIODARONE HCL 200 MG PO TABS: ORAL_TABLET | ORAL | 1 refills | Status: DC

## 2020-07-01 NOTE — Addendum Note (Signed)
Addended by: Stanton Kidney on: 07/01/2020 11:29 AM   Modules accepted: Orders

## 2020-07-01 NOTE — Progress Notes (Signed)
Electrophysiology Office Follow up Visit Note:    Date:  07/01/2020   ID:  [name], DOB 02-Oct-1950, MRN [id number]  PCP:  Glean Hess, MD  [facility] Cardiologist:  Nelva Bush, MD  [facility] Electrophysiologist:  Vickie Epley, MD    Interval History:    [name] is a 70 y.o. male who presents for a follow up visit.  He had a CRT-D implanted March 23, 2020 for ischemic cardiomyopathy with left bundle branch block.  He did well with the procedure and has recovered well.  He is active and playing golf.    Past Medical History:  Diagnosis Date  . Abnormal nuclear cardiac imaging test 08/08/2015  . Arthritis    fingers  . Carotid artery occlusion   . CHF (congestive heart failure) ([place])   . Coronary atherosclerosis of native coronary artery 01/29/2013   11/05/19 R/LHC 80% dLMCA stenosis small diffusely dz dLAD, chronically occluded OM1 50%mRCA lesion, widely patent mLCx strent, moderately elevated L heart filling pressures, mild to moderate RH filling pressures, normal to moderately reduced CO  . Duodenal erosion   . Encounter for screening for lung cancer 07/13/2016  . Esophageal stenosis    esophageal dilation  . GERD (gastroesophageal reflux disease)   . H. pylori infection   . Heart attack [facility]) Oct. 2009   Mild  . Hiatal hernia   . Hyperlipidemia   . Hypertension   . 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 Cardiac catheterization-nonobstructive 60% RCA disease-subsequent nuclear stress test-9 minutes, low risk, mild inferior wall hypokinesis   . Pain in limb 12/19/2017  . Peripheral vascular disease ([place])   . Unstable angina ([place]) 11/25/2017    Past Surgical History:  Procedure Laterality Date  . APPENDECTOMY    . BACK SURGERY    . BIV ICD INSERTION CRT-D N/A 03/23/2020   Procedure: BIV ICD INSERTION CRT-D;  Surgeon: Vickie Epley, MD;  Location: [place] CV [facility];  Service:  Cardiovascular;  Laterality: N/A;  . CARDIAC CATHETERIZATION N/A 08/07/2015   Procedure: Left Heart Cath and Coronary Angiography;  Surgeon: [name], MD;  Location: [place] [facility];  Service: Cardiovascular;  Laterality: N/A;  . CARDIAC CATHETERIZATION N/A 08/07/2015   Procedure: Coronary Stent Intervention;  Surgeon: [name], MD;  Location: [place] CV [facility];  Service: Cardiovascular;  Laterality: N/A;  . CARDIAC CATHETERIZATION N/A 08/07/2015   Procedure: Coronary Stent Intervention;  Surgeon: [name], MD;  Location: [place] CV [facility];  Service: Cardiovascular;  Laterality: N/A;  . CAROTID ENDARTERECTOMY  01/05/2006   Right  CEA with DPA  . CATARACT EXTRACTION W/ INTRAOCULAR LENS IMPLANT Left 12/04/2017  . CATARACT EXTRACTION W/PHACO Left 12/04/2017   Procedure: CATARACT EXTRACTION PHACO AND INTRAOCULAR LENS PLACEMENT ([place]) LEFT;  Surgeon: Eulogio Bear, MD;  Location: [place];  Service: Ophthalmology;  Laterality: Left;  . CATARACT EXTRACTION W/PHACO Right 02/06/2018   Procedure: CATARACT EXTRACTION PHACO AND INTRAOCULAR LENS PLACEMENT (IOC)RIGHT;  Surgeon: Eulogio Bear, MD;  Location: [place];  Service: Ophthalmology;  Laterality: Right;  . COLONOSCOPY  05/20/2008  . COLONOSCOPY WITH PROPOFOL N/A 09/13/2018   Procedure: COLONOSCOPY WITH BIOPSY;  Surgeon: [name], MD;  Location: [place];  Service: Endoscopy;  Laterality: N/A;  . CORONARY ARTERY BYPASS GRAFT N/A 11/11/2019   Procedure: CORONARY ARTERY BYPASS GRAFTING (CABG) USING LIMA to Diag1; ENDOSCOPICALLY HARVESTED RIGHT GREATER SAPHENOUS  VEIN: SVG to OM1; SVG to OM2; SVG to PDA.;  Surgeon: Ivin Poot, MD;  Location: [place];  Service: Open Heart Surgery;  Laterality: N/A;  . CORONARY STENT PLACEMENT  08/07/2015   MID CIRCUMFLEX  . ENDOVEIN HARVEST OF GREATER SAPHENOUS VEIN Right 11/11/2019   Procedure: ENDOVEIN HARVEST OF GREATER SAPHENOUS VEIN;  Surgeon: Ivin Poot, MD;  Location: [place];  Service: Open Heart Surgery;  Laterality: Right;  . ESOPHAGOGASTRODUODENOSCOPY (EGD) WITH PROPOFOL N/A 02/11/2019   Procedure: ESOPHAGOGASTRODUODENOSCOPY (EGD) WITH BIOPSY and  Dilation;  Surgeon: [name], MD;  Location: [place];  Service: Endoscopy;  Laterality: N/A;  . HIP SURGERY Left 10/2016   left hip tendon repair  . LEFT HEART CATH AND CORONARY ANGIOGRAPHY N/A 11/27/2017   Procedure: LEFT HEART CATH AND CORONARY ANGIOGRAPHY;  Surgeon: Wellington Hampshire, MD;  Location: [place] [facility];  Service: Cardiovascular;  Laterality: N/A;  . LOWER EXTREMITY ANGIOGRAPHY Left 02/12/2018   Procedure: LOWER EXTREMITY ANGIOGRAPHY;  Surgeon: Algernon Huxley, MD;  Location: [place] CV [facility];  Service: Cardiovascular;  Laterality: Left;  . LOWER EXTREMITY ANGIOGRAPHY Left 03/07/2018   Procedure: LOWER EXTREMITY ANGIOGRAPHY;  Surgeon: Algernon Huxley, MD;  Location: [place] [facility];  Service: Cardiovascular;  Laterality: Left;  . LOWER EXTREMITY ANGIOGRAPHY Left 06/04/2018   Procedure: LOWER EXTREMITY ANGIOGRAPHY;  Surgeon: Algernon Huxley, MD;  Location: [place] CV [facility];  Service: Cardiovascular;  Laterality: Left;  . PLACEMENT OF IMPELLA LEFT VENTRICULAR ASSIST DEVICE N/A 11/11/2019   Procedure: PLACEMENT OF IMPELLA LEFT VENTRICULAR ASSIST DEVICE 5.5;  Surgeon: Ivin Poot, MD;  Location: [place];  Service: Open Heart Surgery;  Laterality: N/A;  Midline Sternotomy  . POLYPECTOMY N/A 09/13/2018   Procedure: POLYPECTOMY;  Surgeon: [name], MD;  Location: [place];  Service: Endoscopy;  Laterality: N/A;  . POLYPECTOMY N/A 02/11/2019   Procedure: POLYPECTOMY;  Surgeon: [name], MD;  Location: ;  Service: Endoscopy;  Laterality: N/A;  . REMOVAL OF IMPELLA LEFT VENTRICULAR ASSIST DEVICE N/A 11/15/2019   Procedure: REMOVAL OF IMPELLA 5.5 LEFT VENTRICULAR ASSIST DEVICE;  Surgeon: Ivin Poot, MD;  Location: [place];   Service: Open Heart Surgery;  Laterality: N/A;  . RIGHT/LEFT HEART CATH AND CORONARY ANGIOGRAPHY N/A 11/05/2019   Procedure: RIGHT/LEFT HEART CATH AND CORONARY ANGIOGRAPHY;  Surgeon: Nelva Bush, MD;  Location: [place] [facility];  Service: Cardiovascular;  Laterality: N/A;  . SPINE SURGERY    . TEE WITHOUT CARDIOVERSION N/A 11/11/2019   Procedure: TRANSESOPHAGEAL ECHOCARDIOGRAM (TEE);  Surgeon: Prescott Gum, Collier Salina, MD;  Location: [place];  Service: Open Heart Surgery;  Laterality: N/A;  . TEE WITHOUT CARDIOVERSION N/A 11/15/2019   Procedure: TRANSESOPHAGEAL ECHOCARDIOGRAM (TEE);  Surgeon: Prescott Gum, Collier Salina, MD;  Location: [place];  Service: Open Heart Surgery;  Laterality: N/A;  . TONSILLECTOMY      Current Medications: Current Meds  Medication Sig  . Alirocumab (PRALUENT) 150 MG/ML SOAJ Inject 1 pen into the skin every 14 (fourteen) days.  [place] Kitchen apixaban (ELIQUIS) 5 MG TABS tablet Take 1 tablet (5 mg total) by mouth 2 (two) times daily.  . carvedilol (COREG) 12.5 MG tablet Take 1 tablet (12.5 mg total) by mouth 2 (two) times daily.  . colchicine 0.6 MG tablet Take 1 tablet (0.6 mg total) by mouth daily as needed (Gout).  . nitroGLYCERIN (NITROSTAT) 0.4 MG SL tablet Place 1 tablet (0.4 mg total) under the tongue every 5 (five)  minutes as needed for chest pain.  . pantoprazole (PROTONIX) 40 MG tablet TAKE 1 TABLET (40 MG TOTAL) BY MOUTH DAILY. **PLEASE SCHEDULE FOLLOW UP APPT**  . sacubitril-valsartan (ENTRESTO) 49-51 MG Take 1 tablet by mouth 2 (two) times daily.  [place] Kitchen torsemide (DEMADEX) 10 MG tablet Take 1 tablet (10 mg total) by mouth daily.  . traMADol (ULTRAM) 50 MG tablet Take 1 tablet (50 mg total) by mouth every 6 (six) hours as needed.  . traZODone (DESYREL) 50 MG tablet TAKE 1 TABLET BY MOUTH AT BEDTIME AS NEEDED FOR SLEEP.  [place] Kitchen triamcinolone cream (KENALOG) 0.1 % Apply 1 application topically 2 (two) times daily as needed for itching.     Allergies:   Brilinta [ticagrelor], Chlorhexidine  gluconate, Statins, and Zetia [ezetimibe]   Social History   Socioeconomic History  . Marital status: Married    Spouse name: Not on file  . Number of children: 1  . Years of education: Not on file  . Highest education level: Bachelor's degree (e.g., BA, AB, BS)  Occupational History  . Not on file  Tobacco Use  . Smoking status: Former Smoker    Packs/day: 1.25    Years: 35.00    Pack years: 43.75    Types: Cigarettes    Quit date: 02/28/2005    Years since quitting: 15.3  . Smokeless tobacco: Current User    Types: Snuff  . Tobacco comment: occaisionally  Vaping Use  . Vaping Use: Never used  Substance and Sexual Activity  . Alcohol use: Yes    Alcohol/week: 8.0 - 10.0 standard drinks    Types: 8 - 10 Glasses of wine per week    Comment: weekly  . Drug use: No  . Sexual activity: Yes  Other Topics Concern  . Not on file  Social History Narrative  . Not on file   Social Determinants of Health   Financial Resource Strain: Low Risk   . Difficulty of Paying Living Expenses: Not hard at all  Food Insecurity: No Food Insecurity  . Worried About Charity fundraiser in the Last Year: Never true  . Ran Out of Food in the Last Year: Never true  Transportation Needs: No Transportation Needs  . Lack of Transportation (Medical): No  . Lack of Transportation (Non-Medical): No  Physical Activity: Sufficiently Active  . Days of Exercise per Week: 4 days  . Minutes of Exercise per Session: 60 min  Stress: No Stress Concern Present  . Feeling of Stress : Not at all  Social Connections: Moderately Integrated  . Frequency of Communication with Friends and Family: More than three times a week  . Frequency of Social Gatherings with Friends and Family: Three times a week  . Attends Religious Services: Never  . Active Member of Clubs or Organizations: Yes  . Attends Archivist Meetings: More than 4 times per year  . Marital Status: Married     Family History: The  patient's family history includes Cancer (age of onset: 73) in his father; Coronary artery disease in his mother; Diabetes in his mother; Heart attack in his father and mother; Heart disease in his father and mother; Hypertension in his father and mother; Stroke in his father. There is no history of Colon cancer, Colon polyps, Esophageal cancer, Rectal cancer, or Stomach cancer.  ROS:   Please see the history of present illness.    All other systems reviewed and are negative.  EKGs/Labs/Other Studies Reviewed:    The following  studies were reviewed today:  March 02, 2020 EKG Left bundle branch block   05/20/2020 ECG        Jul 01, 2020 device interrogation in clinic personally reviewed with reprogramming Lead parameters are stable Reprogrammed atrial output given 3 months out from implant PVARP adjusted to 300 ms given for PMT episodes.  Retrograde conduction varied from 170 to 440 ms with average of 250 ms  12% PVC burden Core view not suggestive of volume overload Battery longevity estimated at 6 V-V optimization performed today and suggest better QRS morphology with LV preexcited by 40 ms    EKG:  The ekg ordered today demonstrates biventricular pacing, a paced, V pace.  QRS duration 140 ms.  Recent Labs: 11/07/2019: TSH 3.303 11/16/2019: Magnesium 2.0 11/18/2019: ALT 16 12/04/2019: BNP 202.7 03/02/2020: Hemoglobin 14.0; Platelets 192 06/18/2020: BUN 25; Creatinine, Ser 1.26; Potassium 4.7; Sodium 139  Recent Lipid Panel    Component Value Date/Time   CHOL 144 01/07/2020 1601   CHOL 144 12/10/2018 0925   TRIG 158 (H) 01/07/2020 1601   HDL 62 01/07/2020 1601   HDL 59 12/10/2018 0925   CHOLHDL 2.3 01/07/2020 1601   VLDL 32 01/07/2020 1601   LDLCALC 50 01/07/2020 1601   LDLCALC 68 12/10/2018 0925    Physical Exam:    VS:  BP 132/80   Pulse 64   Ht 5\' 6"  (1.676 m)   Wt 183 lb 9.6 oz (83.3 kg)   SpO2 95%   BMI 29.63 kg/m     Wt Readings from Last 3 Encounters:   07/01/20 183 lb 9.6 oz (83.3 kg)  06/18/20 186 lb (84.4 kg)  05/20/20 183 lb 6 oz (83.2 kg)     GEN:  Well nourished, well developed in no acute distress HEENT: Normal NECK: No JVD; No carotid bruits LYMPHATICS: No lymphadenopathy CARDIAC: RRR, no murmurs, rubs, gallops RESPIRATORY:  Clear to auscultation without rales, wheezing or rhonchi  ABDOMEN: Soft, non-tender, non-distended MUSCULOSKELETAL:  No edema; No deformity  SKIN: Warm and dry NEUROLOGIC:  Alert and oriented x 3 PSYCHIATRIC:  Normal affect   ASSESSMENT:    1. Ischemic cardiomyopathy   2. Chronic systolic heart failure ([place])   3. Left bundle branch block   4. Encounter for Tsao-term (current) use of high-risk medication   5. Cardiac resynchronization therapy defibrillator (CRT-D) in place   6. Frequent PVCs    PLAN:    In order of problems listed above:  1. Chronic systolic heart failure secondary to ischemic cardiomyopathy NYHA class II.  Warm and dry.  Seems to be symptomatically improved since CRT-D implant and optimization of his medical regimen.  Is followed by the heart failure clinic.  I do think he is dry today on exam.  His core view also confirms this.  Continue heart failure regimen including Entresto, Coreg.  2.  CRT-D Performed V-V optimization during today's clinic visit.  Preexcitation of the left ventricular lead by 40 ms seems to have the most ideal QRS morphology. Biventricular pacing limited by frequent PVCs (12%).  We will start amiodarone to see if we can suppress the PVCs to improve his biventricular pacing percentage.  I did discuss amiodarone with the patient and his wife during today's visit including the risks and they agreed to proceed with loading. Plan to see him back in 3 months with a CMP, TSH and free T4.  3.  Paroxysmal atrial fibrillation Device interrogation today shows no A. fib episodes since implant.  Continues  to take Eliquis 5 mg twice daily for stroke prophylaxis.  For  now, I have encouraged him to continue this medication.  We can reassess this in the future depending on his A. fib burden on the device.  Follow-up 3 months or sooner as needed  Total time spent with patient today 40 minutes. This includes reviewing records, evaluating the patient and coordinating care.   Medication Adjustments/Labs and Tests Ordered: Current medicines are reviewed at length with the patient today.  Concerns regarding medicines are outlined above.  No orders of the defined types were placed in this encounter.  No orders of the defined types were placed in this encounter.    Signed, Lars Mage, MD, [facility], [facility] 07/01/2020 10:45 AM    Electrophysiology [facility]

## 2020-07-01 NOTE — Patient Instructions (Addendum)
Medication Instructions:  Your physician has recommended you make the following change in your medication:  1. START Amiodarone --  Take 2 tablets (400 mg total) TWICE a day for 5 days, then  Take 2 tablets (400 mg total) ONCE a day for 5 days, then  Take 1 tablet (200 mg total) ONCE a day  *If you need a refill on your cardiac medications before your next appointment, please call your pharmacy*   Lab Work: Your physician recommends that you return for lab work a week prior to your 3 month follow up with Dr. Quentin Ore  (CMP, TSH, free T4). You can stop by any [facility] for this blood work.  If you have labs (blood work) drawn today and your tests are completely normal, you will receive your results only by: Marland Kitchen MyChart Message (if you have MyChart) OR . A paper copy in the mail If you have any lab test that is abnormal or we need to change your treatment, we will call you to review the results.   Testing/Procedures: None ordered   Follow-Up: At [facility], you and your health needs are our priority.  As part of our continuing mission to provide you with exceptional heart care, we have created designated Provider Care Teams.  These Care Teams include your primary Cardiologist (physician) and Advanced Practice Providers (APPs -  Physician Assistants and Nurse Practitioners) who all work together to provide you with the care you need, when you need it.  Your next appointment:   3 month(s)  The format for your next appointment:   In Person  Provider:   Lars Mage, MD    Thank you for choosing [facility]!!    Other Instructions  Amiodarone tablets What is this medicine? AMIODARONE (a MEE oh da rone) is an antiarrhythmic drug. It helps make your heart beat regularly. Because of the side effects caused by this medicine, it is only used when other medicines have not worked. It is usually used for heartbeat problems that may be life threatening. This medicine may be used  for other purposes; ask your health care provider or pharmacist if you have questions. COMMON BRAND NAME(S): Cordarone, Pacerone What should I tell my health care provider before I take this medicine? They need to know if you have any of these conditions:  liver disease  lung disease  other heart problems  thyroid disease  an unusual or allergic reaction to amiodarone, iodine, other medicines, foods, dyes, or preservatives  pregnant or trying to get pregnant  breast-feeding How should I use this medicine? Take this medicine by mouth with a glass of water. Follow the directions on the prescription label. You can take this medicine with or without food. However, you should always take it the same way each time. Take your doses at regular intervals. Do not take your medicine more often than directed. Do not stop taking except on the advice of your doctor or health care professional. A special MedGuide will be given to you by the pharmacist with each prescription and refill. Be sure to read this information carefully each time. Talk to your pediatrician regarding the use of this medicine in children. Special care may be needed. Overdosage: If you think you have taken too much of this medicine contact a poison control center or emergency room at once. NOTE: This medicine is only for you. Do not share this medicine with others. What if I miss a dose? If you miss a dose, take it as  soon as you can. If it is almost time for your next dose, take only that dose. Do not take double or extra doses. What may interact with this medicine? Do not take this medicine with any of the following medications:  abarelix  apomorphine  arsenic trioxide  certain antibiotics like erythromycin, gemifloxacin, levofloxacin, pentamidine  certain medicines for depression like amoxapine, tricyclic antidepressants  certain medicines for fungal infections like fluconazole, itraconazole, ketoconazole,  posaconazole, voriconazole  certain medicines for irregular heart beat like disopyramide, dronedarone, ibutilide, propafenone, sotalol  certain medicines for malaria like chloroquine, halofantrine  cisapride  droperidol  haloperidol  hawthorn  maprotiline  methadone  phenothiazines like chlorpromazine, mesoridazine, thioridazine  pimozide  ranolazine  red yeast rice  vardenafil This medicine may also interact with the following medications:  antiviral medicines for HIV or AIDS  certain medicines for blood pressure, heart disease, irregular heart beat  certain medicines for cholesterol like atorvastatin, cerivastatin, lovastatin, simvastatin  certain medicines for hepatitis C like sofosbuvir and ledipasvir; sofosbuvir  certain medicines for seizures like phenytoin  certain medicines for thyroid problems  certain medicines that treat or prevent blood clots like warfarin  cholestyramine  cimetidine  clopidogrel  cyclosporine  dextromethorphan  diuretics  dofetilide  fentanyl  general anesthetics  grapefruit juice  lidocaine  loratadine  methotrexate  other medicines that prolong the QT interval (cause an abnormal heart rhythm)  procainamide  quinidine  rifabutin, rifampin, or rifapentine  St. John's Wort  trazodone  ziprasidone This list may not describe all possible interactions. Give your health care provider a list of all the medicines, herbs, non-prescription drugs, or dietary supplements you use. Also tell them if you smoke, drink alcohol, or use illegal drugs. Some items may interact with your medicine. What should I watch for while using this medicine? Your condition will be monitored closely when you first begin therapy. Often, this drug is first started in a hospital or other monitored health care setting. Once you are on maintenance therapy, visit your doctor or health care professional for regular checks on your progress.  Because your condition and use of this medicine carry some risk, it is a good idea to carry an identification card, necklace or bracelet with details of your condition, medications, and doctor or health care professional. [name] may get drowsy or dizzy. Do not drive, use machinery, or do anything that needs mental alertness until you know how this medicine affects you. Do not stand or sit up quickly, especially if you are an older patient. This reduces the risk of dizzy or fainting spells. This medicine can make you more sensitive to the sun. Keep out of the sun. If you cannot avoid being in the sun, wear protective clothing and use sunscreen. Do not use sun lamps or tanning beds/booths. You should have regular eye exams before and during treatment. Call your doctor if you have blurred vision, see halos, or your eyes become sensitive to light. Your eyes may get dry. It may be helpful to use a lubricating eye solution or artificial tears solution. If you are going to have surgery or a procedure that requires contrast dyes, tell your doctor or health care professional that you are taking this medicine. What side effects may I notice from receiving this medicine? Side effects that you should report to your doctor or health care professional as soon as possible:  allergic reactions like skin rash, itching or hives, swelling of the face, lips, or tongue  blue-gray coloring of  the skin  blurred vision, seeing blue green halos, increased sensitivity of the eyes to light  breathing problems  chest pain  dark urine  fast, irregular heartbeat  feeling faint or light-headed  intolerance to heat or cold  nausea or vomiting  pain and swelling of the scrotum  pain, tingling, numbness in feet, hands  redness, blistering, peeling or loosening of the skin, including inside the mouth  spitting up blood  stomach pain  sweating  unusual or uncontrolled movements of body  unusually weak or  tired  weight gain or loss  yellowing of the eyes or skin Side effects that usually do not require medical attention (report to your doctor or health care professional if they continue or are bothersome):  change in sex drive or performance  constipation  dizziness  headache  loss of appetite  trouble sleeping This list may not describe all possible side effects. Call your doctor for medical advice about side effects. You may report side effects to FDA at 1-800-FDA-1088. Where should I keep my medicine? Keep out of the reach of children. Store at room temperature between 20 and 25 degrees C (68 and 77 degrees F). Protect from light. Keep container tightly closed. Throw away any unused medicine after the expiration date. NOTE: This sheet is a summary. It may not cover all possible information. If you have questions about this medicine, talk to your doctor, pharmacist, or health care provider.  2021 [employer] (2018-01-17 13:44:04)

## 2020-07-08 ENCOUNTER — Telehealth: Payer: Self-pay | Admitting: Cardiology

## 2020-07-08 NOTE — Telephone Encounter (Signed)
Responded via mychart:  Good afternoon!  Dr. Quentin Ore did not want you to get lab work until 1 week prior to your next appointment with him.  Your next appointment is August 10.  So you can get your lab work any time beginning of August.  Your nurse last week advised that she put in your lab orders so that they could be drawn at any [facility].  If you would like I could print your lab orders and mail them to you?  Sonia Baller

## 2020-07-08 NOTE — Telephone Encounter (Signed)
Patient start date for new med amiodarone not until 5/722.    Please call to advise when to get labs drawn considering new start date and also confirm order sent to [facility] in [place],

## 2020-07-09 ENCOUNTER — Other Ambulatory Visit: Payer: Self-pay | Admitting: Gastroenterology

## 2020-07-10 NOTE — Telephone Encounter (Signed)
Mailed lab orders to Pt.  No further action needed.

## 2020-07-14 DIAGNOSIS — M545 Low back pain, unspecified: Secondary | ICD-10-CM | POA: Diagnosis not present

## 2020-07-14 DIAGNOSIS — M9903 Segmental and somatic dysfunction of lumbar region: Secondary | ICD-10-CM | POA: Diagnosis not present

## 2020-07-14 NOTE — Progress Notes (Signed)
Remote ICD transmission.   

## 2020-07-15 DIAGNOSIS — M545 Low back pain, unspecified: Secondary | ICD-10-CM | POA: Diagnosis not present

## 2020-07-15 DIAGNOSIS — M9903 Segmental and somatic dysfunction of lumbar region: Secondary | ICD-10-CM | POA: Diagnosis not present

## 2020-07-21 ENCOUNTER — Ambulatory Visit (INDEPENDENT_AMBULATORY_CARE_PROVIDER_SITE_OTHER): Payer: PPO

## 2020-07-21 ENCOUNTER — Ambulatory Visit (INDEPENDENT_AMBULATORY_CARE_PROVIDER_SITE_OTHER): Payer: PPO | Admitting: Nurse Practitioner

## 2020-07-21 ENCOUNTER — Encounter (INDEPENDENT_AMBULATORY_CARE_PROVIDER_SITE_OTHER): Payer: Self-pay | Admitting: Vascular Surgery

## 2020-07-21 ENCOUNTER — Other Ambulatory Visit: Payer: Self-pay

## 2020-07-21 VITALS — BP 149/89 | HR 64 | Resp 16 | Wt 185.6 lb

## 2020-07-21 DIAGNOSIS — I6523 Occlusion and stenosis of bilateral carotid arteries: Secondary | ICD-10-CM | POA: Diagnosis not present

## 2020-07-21 DIAGNOSIS — I10 Essential (primary) hypertension: Secondary | ICD-10-CM | POA: Diagnosis not present

## 2020-07-21 DIAGNOSIS — E785 Hyperlipidemia, unspecified: Secondary | ICD-10-CM

## 2020-07-21 DIAGNOSIS — I70229 Atherosclerosis of native arteries of extremities with rest pain, unspecified extremity: Secondary | ICD-10-CM | POA: Diagnosis not present

## 2020-07-21 DIAGNOSIS — I739 Peripheral vascular disease, unspecified: Secondary | ICD-10-CM

## 2020-07-21 NOTE — Progress Notes (Signed)
Subjective:    Patient ID: [name], male    DOB: 06-22-1950, 70 y.o.   MRN: [id number] Chief Complaint  Patient presents with  . Follow-up    Ultrasound follow up    [name] is a 70 year old male that presents today for follow-up of carotid artery stenosis as well as peripheral artery disease.  Since the patient's last office visit he has had some extensive cardiology procedures including CABG as well as placement of a BiV ICD.  Currently he is doing well.  He denies any extensive claudication-like symptoms.  He denies any rest pain or ulcerations.  He also denies any TIA or CVA-like symptoms.  He does endorse some hip pain but that is more musculoskeletal in nature.  Overall he is doing well.  Today noninvasive studies show carotid artery stenosis of 1 to 39% in the right ICA with a patent right endarterectomy with no evidence of restenosis.  Left carotid artery is consistent with a 40 to 59% stenosis.  The bilateral vertebral arteries have antegrade flow with normal flow hemodynamics in the bilateral subclavian arteries.  Today the ABIs show a right ABI of 1.15 with a left of 1.09.  Previously on 01/18/2019 it was 1 in the right and 0.96 on the left.  He has triphasic tibial artery waveforms with good toe waveforms bilaterally.     Review of Systems  Cardiovascular: Negative for leg swelling.  Skin: Negative for wound.  All other systems reviewed and are negative.      Objective:   Physical Exam Vitals reviewed.  HENT:     Head: Normocephalic.  Cardiovascular:     Rate and Rhythm: Normal rate.     Pulses: Normal pulses.  Pulmonary:     Effort: Pulmonary effort is normal.  Skin:    General: Skin is warm and dry.  Neurological:     Mental Status: He is alert and oriented to person, place, and time.  Psychiatric:        Mood and Affect: Mood normal.        Behavior: Behavior normal.        Thought Content: Thought content normal.        Judgment: Judgment  normal.     BP (!) 149/89 (BP Location: Right Arm)   Pulse 64   Resp 16   Wt 185 lb 9.6 oz (84.2 kg)   BMI 29.96 kg/m   Past Medical History:  Diagnosis Date  . Abnormal nuclear cardiac imaging test 08/08/2015  . Arthritis    fingers  . Carotid artery occlusion   . CHF (congestive heart failure) ([place])   . Coronary atherosclerosis of native coronary artery 01/29/2013   11/05/19 R/LHC 80% dLMCA stenosis small diffusely dz dLAD, chronically occluded OM1 50%mRCA lesion, widely patent mLCx strent, moderately elevated L heart filling pressures, mild to moderate RH filling pressures, normal to moderately reduced CO  . Duodenal erosion   . Encounter for screening for lung cancer 07/13/2016  . Esophageal stenosis    esophageal dilation  . GERD (gastroesophageal reflux disease)   . H. pylori infection   . Heart attack [facility]) Oct. 2009   Mild  . Hiatal hernia   . Hyperlipidemia   . Hypertension   . 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 Cardiac catheterization-nonobstructive 60% RCA disease-subsequent nuclear stress test-9 minutes, low risk, mild inferior wall hypokinesis   . Pain in limb 12/19/2017  . Peripheral  vascular disease ([place])   . Unstable angina ([place]) 11/25/2017    Social History   Socioeconomic History  . Marital status: Married    Spouse name: Not on file  . Number of children: 1  . Years of education: Not on file  . Highest education level: Bachelor's degree (e.g., BA, AB, BS)  Occupational History  . Not on file  Tobacco Use  . Smoking status: Former Smoker    Packs/day: 1.25    Years: 35.00    Pack years: 43.75    Types: Cigarettes    Quit date: 02/28/2005    Years since quitting: 15.4  . Smokeless tobacco: Current User    Types: Snuff  . Tobacco comment: occaisionally  Vaping Use  . Vaping Use: Never used  Substance and Sexual Activity  . Alcohol use: Yes    Alcohol/week: 8.0 - 10.0 standard drinks     Types: 8 - 10 Glasses of wine per week    Comment: weekly  . Drug use: No  . Sexual activity: Yes  Other Topics Concern  . Not on file  Social History Narrative  . Not on file   Social Determinants of Health   Financial Resource Strain: Low Risk   . Difficulty of Paying Living Expenses: Not hard at all  Food Insecurity: No Food Insecurity  . Worried About Charity fundraiser in the Last Year: Never true  . Ran Out of Food in the Last Year: Never true  Transportation Needs: No Transportation Needs  . Lack of Transportation (Medical): No  . Lack of Transportation (Non-Medical): No  Physical Activity: Sufficiently Active  . Days of Exercise per Week: 4 days  . Minutes of Exercise per Session: 60 min  Stress: No Stress Concern Present  . Feeling of Stress : Not at all  Social Connections: Moderately Integrated  . Frequency of Communication with Friends and Family: More than three times a week  . Frequency of Social Gatherings with Friends and Family: Three times a week  . Attends Religious Services: Never  . Active Member of Clubs or Organizations: Yes  . Attends Archivist Meetings: More than 4 times per year  . Marital Status: Married  Human resources officer Violence: Not At Risk  . Fear of Current or Ex-Partner: No  . Emotionally Abused: No  . Physically Abused: No  . Sexually Abused: No    Past Surgical History:  Procedure Laterality Date  . APPENDECTOMY    . BACK SURGERY    . BIV ICD INSERTION CRT-D N/A 03/23/2020   Procedure: BIV ICD INSERTION CRT-D;  Surgeon: Vickie Epley, MD;  Location: [place] [facility];  Service: Cardiovascular;  Laterality: N/A;  . CARDIAC CATHETERIZATION N/A 08/07/2015   Procedure: Left Heart Cath and Coronary Angiography;  Surgeon: [name], MD;  Location: [place] [facility];  Service: Cardiovascular;  Laterality: N/A;  . CARDIAC CATHETERIZATION N/A 08/07/2015   Procedure: Coronary Stent Intervention;  Surgeon: [name], MD;   Location: [place] [facility];  Service: Cardiovascular;  Laterality: N/A;  . CARDIAC CATHETERIZATION N/A 08/07/2015   Procedure: Coronary Stent Intervention;  Surgeon: [name], MD;  Location: [place] [facility];  Service: Cardiovascular;  Laterality: N/A;  . CAROTID ENDARTERECTOMY  01/05/2006   Right  CEA with DPA  . CATARACT EXTRACTION W/ INTRAOCULAR LENS IMPLANT Left 12/04/2017  . CATARACT EXTRACTION W/PHACO Left 12/04/2017   Procedure: CATARACT EXTRACTION PHACO AND INTRAOCULAR LENS PLACEMENT (IOC) LEFT;  Surgeon: Eulogio Bear, MD;  Location: [place];  Service: Ophthalmology;  Laterality: Left;  . CATARACT EXTRACTION W/PHACO Right 02/06/2018   Procedure: CATARACT EXTRACTION PHACO AND INTRAOCULAR LENS PLACEMENT (IOC)RIGHT;  Surgeon: Eulogio Bear, MD;  Location: [place];  Service: Ophthalmology;  Laterality: Right;  . COLONOSCOPY  05/20/2008  . COLONOSCOPY WITH PROPOFOL N/A 09/13/2018   Procedure: COLONOSCOPY WITH BIOPSY;  Surgeon: [name], MD;  Location: [place];  Service: Endoscopy;  Laterality: N/A;  . CORONARY ARTERY BYPASS GRAFT N/A 11/11/2019   Procedure: CORONARY ARTERY BYPASS GRAFTING (CABG) USING LIMA to Diag1; ENDOSCOPICALLY HARVESTED RIGHT GREATER SAPHENOUS VEIN: SVG to OM1; SVG to OM2; SVG to PDA.;  Surgeon: Ivin Poot, MD;  Location: [place];  Service: Open Heart Surgery;  Laterality: N/A;  . CORONARY STENT PLACEMENT  08/07/2015   MID CIRCUMFLEX  . ENDOVEIN HARVEST OF GREATER SAPHENOUS VEIN Right 11/11/2019   Procedure: ENDOVEIN HARVEST OF GREATER SAPHENOUS VEIN;  Surgeon: Ivin Poot, MD;  Location: [place];  Service: Open Heart Surgery;  Laterality: Right;  . ESOPHAGOGASTRODUODENOSCOPY (EGD) WITH PROPOFOL N/A 02/11/2019   Procedure: ESOPHAGOGASTRODUODENOSCOPY (EGD) WITH BIOPSY and  Dilation;  Surgeon: [name], MD;  Location: [place];  Service: Endoscopy;  Laterality: N/A;  . HIP SURGERY Left 10/2016    left hip tendon repair  . LEFT HEART CATH AND CORONARY ANGIOGRAPHY N/A 11/27/2017   Procedure: LEFT HEART CATH AND CORONARY ANGIOGRAPHY;  Surgeon: Wellington Hampshire, MD;  Location: [place] [facility];  Service: Cardiovascular;  Laterality: N/A;  . LOWER EXTREMITY ANGIOGRAPHY Left 02/12/2018   Procedure: LOWER EXTREMITY ANGIOGRAPHY;  Surgeon: Algernon Huxley, MD;  Location: [place] [facility];  Service: Cardiovascular;  Laterality: Left;  . LOWER EXTREMITY ANGIOGRAPHY Left 03/07/2018   Procedure: LOWER EXTREMITY ANGIOGRAPHY;  Surgeon: Algernon Huxley, MD;  Location: [place] [facility];  Service: Cardiovascular;  Laterality: Left;  . LOWER EXTREMITY ANGIOGRAPHY Left 06/04/2018   Procedure: LOWER EXTREMITY ANGIOGRAPHY;  Surgeon: Algernon Huxley, MD;  Location: [place] [facility];  Service: Cardiovascular;  Laterality: Left;  . PLACEMENT OF IMPELLA LEFT VENTRICULAR ASSIST DEVICE N/A 11/11/2019   Procedure: PLACEMENT OF IMPELLA LEFT VENTRICULAR ASSIST DEVICE 5.5;  Surgeon: Ivin Poot, MD;  Location: [place];  Service: Open Heart Surgery;  Laterality: N/A;  Midline Sternotomy  . POLYPECTOMY N/A 09/13/2018   Procedure: POLYPECTOMY;  Surgeon: [name], MD;  Location: [place];  Service: Endoscopy;  Laterality: N/A;  . POLYPECTOMY N/A 02/11/2019   Procedure: POLYPECTOMY;  Surgeon: [name], MD;  Location: [place];  Service: Endoscopy;  Laterality: N/A;  . REMOVAL OF IMPELLA LEFT VENTRICULAR ASSIST DEVICE N/A 11/15/2019   Procedure: REMOVAL OF IMPELLA 5.5 LEFT VENTRICULAR ASSIST DEVICE;  Surgeon: Ivin Poot, MD;  Location: [place];  Service: Open Heart Surgery;  Laterality: N/A;  . RIGHT/LEFT HEART CATH AND CORONARY ANGIOGRAPHY N/A 11/05/2019   Procedure: RIGHT/LEFT HEART CATH AND CORONARY ANGIOGRAPHY;  Surgeon: Nelva Bush, MD;  Location: [place] [facility];  Service: Cardiovascular;  Laterality: N/A;  . SPINE SURGERY    . TEE WITHOUT CARDIOVERSION N/A 11/11/2019    Procedure: TRANSESOPHAGEAL ECHOCARDIOGRAM (TEE);  Surgeon: Prescott Gum, Collier Salina, MD;  Location: [place];  Service: Open Heart Surgery;  Laterality: N/A;  . TEE WITHOUT CARDIOVERSION N/A 11/15/2019   Procedure: TRANSESOPHAGEAL ECHOCARDIOGRAM (TEE);  Surgeon: Prescott Gum, Collier Salina, MD;  Location: [place];  Service: Open Heart Surgery;  Laterality:  N/A;  . TONSILLECTOMY      Family History  Problem Relation Age of Onset  . Heart attack Mother   . Coronary artery disease Mother   . Heart disease Mother        Carotid Stenosis and BPG and Heart Disease before age 53  . Diabetes Mother   . Hypertension Mother   . Heart attack Father   . Heart disease Father        BPG and Heart Disease before age 4  . Hypertension Father   . Cancer Father 55       throat  . Stroke Father   . Colon cancer Neg Hx   . Colon polyps Neg Hx   . Esophageal cancer Neg Hx   . Rectal cancer Neg Hx   . Stomach cancer Neg Hx     Allergies  Allergen Reactions  . Brilinta [Ticagrelor] Shortness Of Breath  . Chlorhexidine Gluconate Other (See Comments)    Skin burning for hours afterward  . Statins Other (See Comments)    Failed Crestor 5 mg twice weekly, Crestor 20 mg daily, Pravastatin 40 mg qd, Lipitor, Zocor - muscle aches  . Zetia [Ezetimibe] Other (See Comments)    Muscle aches    CBC Latest Ref Rng & Units 03/02/2020 12/04/2019 11/21/2019  WBC 4.0 - 10.5 K/uL 4.1 5.3 10.2  Hemoglobin 13.0 - 17.0 g/dL 14.0 11.9(L) 10.8(L)  Hematocrit 39.0 - 52.0 % 44.6 35.3(L) 34.6(L)  Platelets 150 - 400 K/uL 192 326 495(H)      CMP     Component Value Date/Time   NA 141 06/29/2020 0906   K 5.4 (H) 06/29/2020 0906   CL 100 06/29/2020 0906   CO2 25 06/29/2020 0906   GLUCOSE 102 (H) 06/29/2020 0906   GLUCOSE 109 (H) 03/21/2020 0855   BUN 42 (H) 06/29/2020 0906   CREATININE 1.50 (H) 06/29/2020 0906   CREATININE 1.12 07/23/2015 1016   CALCIUM 9.4 06/29/2020 0906   PROT 5.7 (L) 11/18/2019 0418   PROT 7.2 05/21/2019 0841    ALBUMIN 2.6 (L) 11/18/2019 0418   ALBUMIN 4.4 05/21/2019 0841   AST 14 (L) 11/18/2019 0418   ALT 16 11/18/2019 0418   ALKPHOS 50 11/18/2019 0418   BILITOT 0.8 11/18/2019 0418   BILITOT 0.5 05/21/2019 0841   GFRNONAA 73 04/22/2020 1013   GFRNONAA 51 (L) 03/21/2020 0855   GFRAA 85 04/22/2020 1013     No results found.     Assessment & Plan:   1. Atherosclerosis of artery of extremity with rest pain ([place])  Recommend:  The patient has evidence of atherosclerosis of the lower extremities with claudication.  The patient does not voice lifestyle limiting changes at this point in time.  Noninvasive studies do not suggest clinically significant change.  No invasive studies, angiography or surgery at this time The patient should continue walking and begin a more formal exercise program.  The patient should continue antiplatelet therapy and aggressive treatment of the lipid abnormalities  No changes in the patient's medications at this time  The patient should continue wearing graduated compression socks 10-15 mmHg strength to control the mild edema.   2. Bilateral carotid artery stenosis Recommend:  Given the patient's asymptomatic subcritical stenosis no further invasive testing or surgery at this time.  Duplex ultrasound shows 1 to 39% stenosis in the right ICA with 40 to 59% stenosis in the left ICA  Continue antiplatelet therapy as prescribed Continue management of CAD, HTN and  Hyperlipidemia Healthy heart diet,  encouraged exercise at least 4 times per week Follow up in 12 months with duplex ultrasound and physical exam    3. Essential hypertension Continue antihypertensive medications as already ordered, these medications have been reviewed and there are no changes at this time.   4. Hyperlipidemia LDL goal <70 Continue statin as ordered and reviewed, no changes at this time    Current Outpatient Medications on File Prior to Visit  Medication Sig Dispense Refill   . Alirocumab (PRALUENT) 150 MG/ML SOAJ Inject 1 pen into the skin every 14 (fourteen) days. 2 mL 11  . amiodarone (PACERONE) 200 MG tablet Take 2 tablets TWICE daily for 5 days, then take 2 tablets ONCE daily for 5 days, then take 1 tablet daily 90 tablet 1  . apixaban (ELIQUIS) 5 MG TABS tablet Take 1 tablet (5 mg total) by mouth 2 (two) times daily. 180 tablet 3  . carvedilol (COREG) 12.5 MG tablet Take 1 tablet (12.5 mg total) by mouth 2 (two) times daily. 180 tablet 3  . colchicine 0.6 MG tablet Take 1 tablet (0.6 mg total) by mouth daily as needed (Gout). 30 tablet 0  . nitroGLYCERIN (NITROSTAT) 0.4 MG SL tablet Place 1 tablet (0.4 mg total) under the tongue every 5 (five) minutes as needed for chest pain. 25 tablet 2  . pantoprazole (PROTONIX) 40 MG tablet TAKE 1 TABLET (40 MG TOTAL) BY MOUTH DAILY. **PLEASE SCHEDULE FOLLOW UP APPT** 30 tablet 0  . sacubitril-valsartan (ENTRESTO) 49-51 MG Take 1 tablet by mouth 2 (two) times daily. 180 tablet 3  . torsemide (DEMADEX) 10 MG tablet Take 1 tablet (10 mg total) by mouth daily. 30 tablet 5  . traMADol (ULTRAM) 50 MG tablet Take 1 tablet (50 mg total) by mouth every 6 (six) hours as needed. 40 tablet 0  . traZODone (DESYREL) 50 MG tablet TAKE 1 TABLET BY MOUTH AT BEDTIME AS NEEDED FOR SLEEP. 90 tablet 1  . triamcinolone cream (KENALOG) 0.1 % Apply 1 application topically 2 (two) times daily as needed for itching.     No current facility-administered medications on file prior to visit.    There are no Patient Instructions on file for this visit. No follow-ups on file.   Kris Hartmann, NP

## 2020-07-24 ENCOUNTER — Other Ambulatory Visit: Payer: Self-pay

## 2020-07-24 ENCOUNTER — Encounter: Payer: Self-pay | Admitting: Physician Assistant

## 2020-07-24 ENCOUNTER — Ambulatory Visit: Payer: PPO | Admitting: Physician Assistant

## 2020-07-24 ENCOUNTER — Other Ambulatory Visit: Payer: Self-pay | Admitting: *Deleted

## 2020-07-24 VITALS — BP 110/78 | HR 64 | Ht 66.0 in | Wt 184.0 lb

## 2020-07-24 DIAGNOSIS — Z951 Presence of aortocoronary bypass graft: Secondary | ICD-10-CM | POA: Diagnosis not present

## 2020-07-24 DIAGNOSIS — Z79899 Other long term (current) drug therapy: Secondary | ICD-10-CM

## 2020-07-24 DIAGNOSIS — I6523 Occlusion and stenosis of bilateral carotid arteries: Secondary | ICD-10-CM

## 2020-07-24 DIAGNOSIS — I255 Ischemic cardiomyopathy: Secondary | ICD-10-CM | POA: Diagnosis not present

## 2020-07-24 DIAGNOSIS — I2510 Atherosclerotic heart disease of native coronary artery without angina pectoris: Secondary | ICD-10-CM

## 2020-07-24 DIAGNOSIS — Z7901 Long term (current) use of anticoagulants: Secondary | ICD-10-CM | POA: Diagnosis not present

## 2020-07-24 DIAGNOSIS — Z95810 Presence of automatic (implantable) cardiac defibrillator: Secondary | ICD-10-CM | POA: Diagnosis not present

## 2020-07-24 DIAGNOSIS — I493 Ventricular premature depolarization: Secondary | ICD-10-CM

## 2020-07-24 DIAGNOSIS — I447 Left bundle-branch block, unspecified: Secondary | ICD-10-CM | POA: Diagnosis not present

## 2020-07-24 DIAGNOSIS — I10 Essential (primary) hypertension: Secondary | ICD-10-CM | POA: Diagnosis not present

## 2020-07-24 MED ORDER — APIXABAN 5 MG PO TABS: 5.0000 mg | ORAL_TABLET | Freq: Two times a day (BID) | ORAL | 1 refills | Status: DC

## 2020-07-24 MED ORDER — SACUBITRIL-VALSARTAN 49-51 MG PO TABS
1.0000 | ORAL_TABLET | Freq: Two times a day (BID) | ORAL | 3 refills | Status: DC
  Filled 2020-07-24: qty 180, 90d supply, fill #0

## 2020-07-24 MED ORDER — PRALUENT 150 MG/ML ~~LOC~~ SOAJ
1.0000 "pen " | SUBCUTANEOUS | 11 refills | Status: DC
  Filled 2020-07-24: qty 2, fill #0

## 2020-07-24 MED ORDER — PRALUENT 150 MG/ML ~~LOC~~ SOAJ: 1.0000 "pen " | SUBCUTANEOUS | 11 refills | Status: DC

## 2020-07-24 NOTE — Patient Instructions (Addendum)
Medication Instructions:  Your physician recommends that you continue on your current medications as directed. Please refer to the Current Medication list given to you today.  *If you need a refill on your cardiac medications before your next appointment, please call your pharmacy*   Lab Work: Your physician recommends that you have lab work TODAY: Liver panel, TSH  If you have labs (blood work) drawn today and your tests are completely normal, you will receive your results only by: Marland Kitchen MyChart Message (if you have MyChart) OR . A paper copy in the mail If you have any lab test that is abnormal or we need to change your treatment, we will call you to review the results.   Testing/Procedures:  Echo ordered  Follow-Up: At [facility], you and your health needs are our priority.  As part of our continuing mission to provide you with exceptional heart care, we have created designated Provider Care Teams.  These Care Teams include your primary Cardiologist (physician) and Advanced Practice Providers (APPs -  Physician Assistants and Nurse Practitioners) who all work together to provide you with the care you need, when you need it.  We recommend signing up for the patient portal called "MyChart".  Sign up information is provided on this After Visit Summary.  MyChart is used to connect with patients for Virtual Visits (Telemedicine).  Patients are able to view lab/test results, encounter notes, upcoming appointments, etc.  Non-urgent messages can be sent to your provider as well.   To learn more about what you can do with MyChart, go to [email or web address].    Your next appointment:   4 month(s)  The format for your next appointment:   In Person  Provider:   Nelva Bush, MD

## 2020-07-24 NOTE — Progress Notes (Signed)
Office Visit    Patient Name: [name] Date of Encounter: 07/24/2020  PCP:  Glean Hess, MD   [place]  Cardiologist:  Nelva Bush, MD  Advanced Practice Provider:  No care team member to display Electrophysiologist:  Vickie Epley, MD   Chief Complaint    Chief Complaint  Patient presents with  . Follow-up    3 Months follow up. Medications verbally reviewed with patient.     70 yo male with history of CAD with NSTEMI 07/2015 s/p PCI to LCx s/p CABG x4 10/2019 for development of severe distal LMCA disease,chronic HFrEF (mixed ICM/NICM) and known LBBB s/p 02/2020 CRT-D implantation (St. Jude) for ICM, paroxysmal atrial fibrillation on Eliquis, carotid disease s/p R carotid endarterectomy, PAD s/p intervention and stent of left SFA 05/2018, statin intolerance on Praluent, HTN, HLD, hiatal hernia, previous tobacco use, PUD, and here for follow-up after recent visit with elevated ReDS vest.   Past Medical History    Past Medical History:  Diagnosis Date  . Abnormal nuclear cardiac imaging test 08/08/2015  . Arthritis    fingers  . Carotid artery occlusion   . CHF (congestive heart failure) ([place])   . Coronary atherosclerosis of native coronary artery 01/29/2013   11/05/19 R/LHC 80% dLMCA stenosis small diffusely dz dLAD, chronically occluded OM1 50%mRCA lesion, widely patent mLCx strent, moderately elevated L heart filling pressures, mild to moderate RH filling pressures, normal to moderately reduced CO  . Duodenal erosion   . Encounter for screening for lung cancer 07/13/2016  . Esophageal stenosis    esophageal dilation  . GERD (gastroesophageal reflux disease)   . H. pylori infection   . Heart attack [facility]) Oct. 2009   Mild  . Hiatal hernia   . Hyperlipidemia   . Hypertension   . 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 Cardiac catheterization-nonobstructive 60% RCA  disease-subsequent nuclear stress test-9 minutes, low risk, mild inferior wall hypokinesis   . Pain in limb 12/19/2017  . Peripheral vascular disease ([place])   . Unstable angina ([place]) 11/25/2017   Past Surgical History:  Procedure Laterality Date  . APPENDECTOMY    . BACK SURGERY    . BIV ICD INSERTION CRT-D N/A 03/23/2020   Procedure: BIV ICD INSERTION CRT-D;  Surgeon: Vickie Epley, MD;  Location: [place] [facility];  Service: Cardiovascular;  Laterality: N/A;  . CARDIAC CATHETERIZATION N/A 08/07/2015   Procedure: Left Heart Cath and Coronary Angiography;  Surgeon: [name], MD;  Location: [place] [facility];  Service: Cardiovascular;  Laterality: N/A;  . CARDIAC CATHETERIZATION N/A 08/07/2015   Procedure: Coronary Stent Intervention;  Surgeon: [name], MD;  Location: [place] [facility];  Service: Cardiovascular;  Laterality: N/A;  . CARDIAC CATHETERIZATION N/A 08/07/2015   Procedure: Coronary Stent Intervention;  Surgeon: [name], MD;  Location: [place] [facility];  Service: Cardiovascular;  Laterality: N/A;  . CAROTID ENDARTERECTOMY  01/05/2006   Right  CEA with DPA  . CATARACT EXTRACTION W/ INTRAOCULAR LENS IMPLANT Left 12/04/2017  . CATARACT EXTRACTION W/PHACO Left 12/04/2017   Procedure: CATARACT EXTRACTION PHACO AND INTRAOCULAR LENS PLACEMENT ([place]) LEFT;  Surgeon: Eulogio Bear, MD;  Location: [place];  Service: Ophthalmology;  Laterality: Left;  . CATARACT EXTRACTION W/PHACO Right 02/06/2018   Procedure: CATARACT EXTRACTION PHACO AND INTRAOCULAR LENS PLACEMENT (IOC)RIGHT;  Surgeon: Eulogio Bear, MD;  Location: [place]  [facility];  Service: Ophthalmology;  Laterality: Right;  . COLONOSCOPY  05/20/2008  . COLONOSCOPY WITH PROPOFOL N/A 09/13/2018   Procedure: COLONOSCOPY WITH BIOPSY;  Surgeon: [name], MD;  Location: [place];  Service: Endoscopy;  Laterality: N/A;  . CORONARY ARTERY BYPASS GRAFT N/A 11/11/2019   Procedure: CORONARY  ARTERY BYPASS GRAFTING (CABG) USING LIMA to Diag1; ENDOSCOPICALLY HARVESTED RIGHT GREATER SAPHENOUS VEIN: SVG to OM1; SVG to OM2; SVG to PDA.;  Surgeon: Ivin Poot, MD;  Location: [place];  Service: Open Heart Surgery;  Laterality: N/A;  . CORONARY STENT PLACEMENT  08/07/2015   MID CIRCUMFLEX  . ENDOVEIN HARVEST OF GREATER SAPHENOUS VEIN Right 11/11/2019   Procedure: ENDOVEIN HARVEST OF GREATER SAPHENOUS VEIN;  Surgeon: Ivin Poot, MD;  Location: [place];  Service: Open Heart Surgery;  Laterality: Right;  . ESOPHAGOGASTRODUODENOSCOPY (EGD) WITH PROPOFOL N/A 02/11/2019   Procedure: ESOPHAGOGASTRODUODENOSCOPY (EGD) WITH BIOPSY and  Dilation;  Surgeon: [name], MD;  Location: [place];  Service: Endoscopy;  Laterality: N/A;  . HIP SURGERY Left 10/2016   left hip tendon repair  . LEFT HEART CATH AND CORONARY ANGIOGRAPHY N/A 11/27/2017   Procedure: LEFT HEART CATH AND CORONARY ANGIOGRAPHY;  Surgeon: Wellington Hampshire, MD;  Location: [place] [facility];  Service: Cardiovascular;  Laterality: N/A;  . LOWER EXTREMITY ANGIOGRAPHY Left 02/12/2018   Procedure: LOWER EXTREMITY ANGIOGRAPHY;  Surgeon: Algernon Huxley, MD;  Location: [place] [facility];  Service: Cardiovascular;  Laterality: Left;  . LOWER EXTREMITY ANGIOGRAPHY Left 03/07/2018   Procedure: LOWER EXTREMITY ANGIOGRAPHY;  Surgeon: Algernon Huxley, MD;  Location: [place] [facility];  Service: Cardiovascular;  Laterality: Left;  . LOWER EXTREMITY ANGIOGRAPHY Left 06/04/2018   Procedure: LOWER EXTREMITY ANGIOGRAPHY;  Surgeon: Algernon Huxley, MD;  Location: [place] [facility];  Service: Cardiovascular;  Laterality: Left;  . PLACEMENT OF IMPELLA LEFT VENTRICULAR ASSIST DEVICE N/A 11/11/2019   Procedure: PLACEMENT OF IMPELLA LEFT VENTRICULAR ASSIST DEVICE 5.5;  Surgeon: Ivin Poot, MD;  Location: [place];  Service: Open Heart Surgery;  Laterality: N/A;  Midline Sternotomy  . POLYPECTOMY N/A 09/13/2018   Procedure: POLYPECTOMY;   Surgeon: [name], MD;  Location: [place];  Service: Endoscopy;  Laterality: N/A;  . POLYPECTOMY N/A 02/11/2019   Procedure: POLYPECTOMY;  Surgeon: [name], MD;  Location: [place];  Service: Endoscopy;  Laterality: N/A;  . REMOVAL OF IMPELLA LEFT VENTRICULAR ASSIST DEVICE N/A 11/15/2019   Procedure: REMOVAL OF IMPELLA 5.5 LEFT VENTRICULAR ASSIST DEVICE;  Surgeon: Ivin Poot, MD;  Location: [place];  Service: Open Heart Surgery;  Laterality: N/A;  . RIGHT/LEFT HEART CATH AND CORONARY ANGIOGRAPHY N/A 11/05/2019   Procedure: RIGHT/LEFT HEART CATH AND CORONARY ANGIOGRAPHY;  Surgeon: Nelva Bush, MD;  Location: [place] [facility];  Service: Cardiovascular;  Laterality: N/A;  . SPINE SURGERY    . TEE WITHOUT CARDIOVERSION N/A 11/11/2019   Procedure: TRANSESOPHAGEAL ECHOCARDIOGRAM (TEE);  Surgeon: Prescott Gum, Collier Salina, MD;  Location: [place];  Service: Open Heart Surgery;  Laterality: N/A;  . TEE WITHOUT CARDIOVERSION N/A 11/15/2019   Procedure: TRANSESOPHAGEAL ECHOCARDIOGRAM (TEE);  Surgeon: Prescott Gum, Collier Salina, MD;  Location: [place];  Service: Open Heart Surgery;  Laterality: N/A;  . TONSILLECTOMY      Allergies  Allergies  Allergen Reactions  . Brilinta [Ticagrelor] Shortness Of Breath  . Chlorhexidine Gluconate Other (See Comments)    Skin burning for hours afterward  . Statins Other (See Comments)  Failed Crestor 5 mg twice weekly, Crestor 20 mg daily, Pravastatin 40 mg qd, Lipitor, Zocor - muscle aches  . Zetia [Ezetimibe] Other (See Comments)    Muscle aches    History of Present Illness    JUN OSMENT is a 70 y.o. male with PMH as above.   He has history of PAD and underwent mechanical thrombectomy and cathter thrombolysis to the L SFA in early 05/2018 and reported minimal leg pain at that time.  He is s/p right CEA with carotid Dopplers 10/2019 showing 40 to 97% LICA.  He is intolerant to statins and on Praluent.  He has history of CAD s/p 2017  non-STEMI and subsequent cath with PCI to LCx 2017.  He underwent repeat LHC 10/2017 with widely patent LCx stent without significant restenosis and otherwise mild to moderate CAD, EF 40-45%, LVEDP 16mmg.  Echo EF 45-50%. Repeat limited echo 01/2018 EF 40-45%.  Medical management advised.  Seen in clinic 09/2019 with DOE and abnormal EKG. Subsequent outpatient [facility] 11/05/2019 showed multivessel CAD with development of severe distal LMCA disease.  Echo EF 25 to 30%, LV severely dilated, mild MR. He was transferred to [facility] for CABG x4 (LIMA-LAD, SVG-OM1, SVG-OM 2, SVG-PDA) with placement of Impella 5.5 on 11/11/2019 (removed POD4). He had postoperative atrial fibrillation with NSR restored once started on IV amiodarone and transitioned to oral amiodarone and Eliquis at discharge. He was IV diuresed and transitioned to p.o. Lasix 40 mg daily at discharge.    When seen by the heart failure clinic 03/02/2020, he was riding his exercise bike / lifting weights 3 times per week.  He was golfing 3-4 times per week. He noted DOE only with heavy exertion.  He was using Lasix on a PRN basis and euvolemic. He remained in NSR with PVCs and known LBBB.  He was no longer taking amiodarone and off of digoxin, due to elevated level and nausea.  Same day echo with EF 30 to 35%, global hypokinesis with septal-lateral dyssynchrony due to LBBB, reduced RV SF, mild LVH, G1 DD.  Given his persistently low EF and LBBB with prominent septal-lateral dyssynchrony on echo, recommendation was for CRT-D implant.    He was seen prior to CRT implantation by his primary cardiologist, Dr. Harrell Gave End, and reported almost complete resolution of chest wall soreness following his CABG with minimal DOE.  He was concerned his heart failure medications were making him feel worse than before his CABG. [place] to elevated K+, spironolactone was discontinued and Entresto held then restarted the evening of 1/22 after repeat labs showed K+ returned to normal  range.  It was noted creatinine was still slightly above baseline with recommendation to drink extra water.  Recommendation was for repeat BMP at the time of his CRT-D placement or on Tuesday prior to discharge home.  This does not appear to have yet been collected.  He underwent [employer]. Jude CRT-D implant 03/23/2020 by Dr. Quentin Ore without complications.  Subsequent wound check appointment 2/3 without erythema or edema and Steri-Strips removed.  Device interrogation 2/3 without significant findings.  Seen February and March 2022, when he was doing well from a cardiac standpoint.  He was excited to return to golf in the near future.  He reported gradual weight gain, attributed to continue lifestyle with restrictions for CRT-D implantation.  Escalation of carvedilol was recommended to 12.5 mg twice daily.  ASA 81 mg was deferred, and he was continued on apixaban until his next device interrogation, at  which time it was recommended per primary cardiologist that he discontinue apixaban and switch to low-dose ASA 81 mg daily thereafter, given there was no evidence of recurrent atrial fibrillation.  He was encouraged to monitor his blood pressure at home, as well as salt/fluid restrictions reviewed.    Seen 06/18/20 and volume up with ReDS 37% and with short term torsemide prescribed then held after bump in Cr.  He was drinking 2 cups of coffee in the morning and a Diet Coke in the evening, as well as drinking 2 glasses of red wine most nights and a glass of iced tea.  Breakfast was typically bacon and eggs. Fluid and salt discussed  He was seen by EP 07/01/2020 with recommendation to continue on amiodarone until at least September 2022 per patient.  His device was adjusted to suppress PVCs.  Amiodarone was restarted.  Today, 07/24/2020, he returns to clinic and has been tolerating his medications, though he does feel a slight decrease in energy.  No chest pain or shortness of breath at rest or with exertion.  He is  frustrated regarding his ongoing weight increase, as it is not due to fluid.  Heart healthy and low-carb diet was recommended.  He denies any signs or symptoms of volume overload.  No reported signs or symptoms consistent with bleeding.  EKG shows changes, discussed with DOD and due to pacing with patient report of no new symptoms since his previous clinic visit.  Home Medications    Current Outpatient Medications on File Prior to Visit  Medication Sig Dispense Refill  . Alirocumab (PRALUENT) 150 MG/ML SOAJ Inject 1 pen into the skin every 14 (fourteen) days. 2 mL 11  . amiodarone (PACERONE) 200 MG tablet Take 2 tablets TWICE daily for 5 days, then take 2 tablets ONCE daily for 5 days, then take 1 tablet daily 90 tablet 1  . apixaban (ELIQUIS) 5 MG TABS tablet Take 1 tablet (5 mg total) by mouth 2 (two) times daily. 180 tablet 3  . carvedilol (COREG) 12.5 MG tablet Take 1 tablet (12.5 mg total) by mouth 2 (two) times daily. 180 tablet 3  . colchicine 0.6 MG tablet Take 1 tablet (0.6 mg total) by mouth daily as needed (Gout). 30 tablet 0  . nitroGLYCERIN (NITROSTAT) 0.4 MG SL tablet Place 1 tablet (0.4 mg total) under the tongue every 5 (five) minutes as needed for chest pain. 25 tablet 2  . pantoprazole (PROTONIX) 40 MG tablet TAKE 1 TABLET (40 MG TOTAL) BY MOUTH DAILY. **PLEASE SCHEDULE FOLLOW UP APPT** 30 tablet 0  . sacubitril-valsartan (ENTRESTO) 49-51 MG Take 1 tablet by mouth 2 (two) times daily. 180 tablet 3  . traMADol (ULTRAM) 50 MG tablet Take 1 tablet (50 mg total) by mouth every 6 (six) hours as needed. 40 tablet 0  . traZODone (DESYREL) 50 MG tablet TAKE 1 TABLET BY MOUTH AT BEDTIME AS NEEDED FOR SLEEP. 90 tablet 1  . triamcinolone cream (KENALOG) 0.1 % Apply 1 application topically 2 (two) times daily as needed for itching.    . torsemide (DEMADEX) 10 MG tablet Take 1 tablet (10 mg total) by mouth daily. (Patient not taking: Reported on 07/24/2020) 30 tablet 5   No current  facility-administered medications on file prior to visit.    Review of Systems    He reports fatigue.  He denies chest pain, palpitations, dyspnea, pnd, orthopnea, n, v, dizziness, syncope, edema, or early satiety. He reports weight gain/gradual weight gain attributed to diet and  sedentary lifestyle.  All other systems reviewed and are otherwise negative except as noted above.  Physical Exam    VS:  BP 110/78 (BP Location: Left Arm, Patient Position: Sitting, Cuff Size: Normal)   Pulse 64   Ht 5\' 6"  (1.676 m)   Wt 184 lb (83.5 kg)   SpO2 96%   BMI 29.70 kg/m  , BMI Body mass index is 29.7 kg/m. GEN: Well nourished, well developed, in no acute distress.  Facemask in place.  Joined by his wife. HEENT: normal. Neck: Supple, no JVD, carotid bruits, or masses. Cardiac: RRR, no murmurs, rubs, or gallops. No clubbing, cyanosis.  Radials/DP/PT 2+ and equal bilaterally.   Respiratory:  Respirations regular and unlabored, CTAB GI: Soft, nontender, nondistended, BS + x 4. MS: no deformity or atrophy. Skin: warm and dry, no rash. Neuro:  Strength and sensation are intact. Psych: Normal affect.  Accessory Clinical Findings    ECG personally reviewed by me today -atrial sensed, ventricular paced rhythm, 64 bpm, T wave inversion noted V1 through V3 after changed to his device at a previous clinic visit.  VITALS Reviewed today   Temp Readings from Last 3 Encounters:  03/23/20 98.1 F (36.7 C) (Oral)  01/29/20 97.6 F (36.4 C) (Skin)  12/18/19 97.6 F (36.4 C) (Skin)   BP Readings from Last 3 Encounters:  07/24/20 110/78  07/21/20 (!) 149/89  07/01/20 132/80   Pulse Readings from Last 3 Encounters:  07/24/20 64  07/21/20 64  07/01/20 64    Wt Readings from Last 3 Encounters:  07/24/20 184 lb (83.5 kg)  07/21/20 185 lb 9.6 oz (84.2 kg)  07/01/20 183 lb 9.6 oz (83.3 kg)     LABS  reviewed today   Lab Results  Component Value Date   WBC 4.1 03/02/2020   HGB 14.0 03/02/2020    HCT 44.6 03/02/2020   MCV 91.0 03/02/2020   PLT 192 03/02/2020   Lab Results  Component Value Date   CREATININE 1.50 (H) 06/29/2020   BUN 42 (H) 06/29/2020   NA 141 06/29/2020   K 5.4 (H) 06/29/2020   CL 100 06/29/2020   CO2 25 06/29/2020   Lab Results  Component Value Date   ALT 16 11/18/2019   AST 14 (L) 11/18/2019   ALKPHOS 50 11/18/2019   BILITOT 0.8 11/18/2019   Lab Results  Component Value Date   CHOL 144 01/07/2020   HDL 62 01/07/2020   LDLCALC 50 01/07/2020   TRIG 158 (H) 01/07/2020   CHOLHDL 2.3 01/07/2020    Lab Results  Component Value Date   HGBA1C 5.6 11/07/2019   Lab Results  Component Value Date   TSH 3.303 11/07/2019     STUDIES/PROCEDURES reviewed today   [facility] 11/05/19 Conclusions: 1. Multivessel CAD, including calcified 80% distal LMCA stenosis, small diffusely diseased distal LAD, chronically occluded OM1, and 50% mid RCA lesion. 2. Widely patent mid LCx stent. 3. Moderately elevated left heart filling pressure. 4. Mildly to moderately elevated right heart filling pressure. 5. Normal to mildly reduced cardiac output/index. Recommendations: 1. Transfer to [facility] for cardiac surgery consultation for CABG, given severe LMCA disease and low LVEF. 2. Hold clopidogrel pending cardiac surgery consultation. 3. Obtain echocardiogram. 4. Aggressive secondary prevention.  US Carotid and extremities 11/07/19 Right Carotid: Velocities in the right ICA are consistent with a 1-39%  stenosis.  Left Carotid: Velocities in the left ICA are consistent with a 40-59%  stenosis.  Vertebrals: Bilateral vertebral arteries demonstrate antegrade  flow.  Subclavians: Normal flow hemodynamics were seen in bilateral subclavian        arteries.  Right ABI: Resting right ankle-brachial index indicates mild right lower  extremity arterial disease. The right toe-brachial index is abnormal.  Left ABI: Resting left ankle-brachial index indicates moderate  left lower  extremity arterial disease. The left toe-brachial index is abnormal.  Right Upper Extremity: Doppler waveform obliterate with right radial  compression. Doppler waveforms remain within normal limits with right  ulnar compression.  Left Upper Extremity: Doppler waveform obliterate with left radial  compression. Doppler waveforms remain within normal limits with left ulnar  compression.   Echo 03/02/20 1. Left ventricular ejection fraction, by estimation, is 30 to 35%. The  left ventricle has moderately decreased function. The left ventricle  demonstrates global hypokinesis with septal-lateral dyssynchrony due to  LBBB. There is mild left ventricular  hypertrophy. Left ventricular diastolic parameters are consistent with  Grade I diastolic dysfunction (impaired relaxation).  2. The mitral valve is normal in structure. Trivial mitral valve  regurgitation. No evidence of mitral stenosis.  3. The aortic valve is tricuspid. Aortic valve regurgitation is trivial.  No aortic stenosis is present.  4. Right ventricular systolic function is mildly reduced. The right  ventricular size is normal. Tricuspid regurgitation signal is inadequate  for assessing PA pressure.  5. The inferior vena cava is normal in size with greater than 50%  respiratory variability, suggesting right atrial pressure of 3 mmHg.   Assessment & Plan    Coronary artery disease s/p CABG x4 (10/2019)  --No angina reported.  EKG today shows changes consistent with biventricular pacing, as discussed with patient today and reviewed by DOD.  He has recovered well from his CABG 10/2019.  Continue Eliquis in lieu of ASA, and per EP recommendations after reviewing previous EP visit note.  Continue carvedilol 12.5 mg twice daily.  Continue alirocumab for aggressive risk factor modification.  He requires refills and we will investigate the proper channels to refill this medication, given he is currently covered via a study.   Sublingual nitro as needed for chest pain.  We will continue diet and lifestyle changes.    Chronic HFrEF (EF 30-35%, 02/2020) S/p CRT-D implantation ([employer] Jude, 02/2020) History of hyperkalemia -- No dyspnea or shortness of breath.  Euvolemic on exam. NYHA Class I-II sx.  S/p 02/2020 CRT-D implantation (St. Jude) for ICM and ongoing reduced EF 30-35% and LBBB. Continue current beta-blocker and Entresto 49-51 mg twice daily.  Empagliflozin discontinued due to cost concerns.  He previously had hyperkalemia with spironolactone, and we will defer very challenging him with an aldosterone antagonist at this time.  Will order repeat echo to reassess LVEF response to medical therapy and biventricular pacing, as well as any improvement with amiodarone.  Continue to follow-up with EP.  Continue an active lifestyle as tolerated.  Limit salt and fluids as previously described.  Postoperative atrial fibrillation Chronic anticoagulation -- Regular rate and rhythm on exam.  NSR by EKG today.  Seen by EP with recommendation to continue Eliquis at this time.  We will continue to defer discontinuing apixaban and reassess at subsequent follow-up.  Will defer ASA in the setting.  Will continue with carvedilol.  Hyperlipidemia, LDL goal below 70 Statin intolerance --Continue Praluent.  Will reach out to the appropriate staff to ensure his refills are covered via his study coverage.  Recommend LDL goal below 70.  12/2019 LDL 50.  Hypertension, goal BP 130/80 or lower --BP  well controlled.  Continue current medications.  PAD -History of PAD. S/p bilateral lower extremity interventions by Dr. Lucky Cowboy.  Denies any symptoms of worsening disease today.  No reported claudication / worsening pain in lower extremities or edema on exam.   Most recent 07/21/2020 studies are not yet read/resulted.  10/2019 studies as above show right ABI indicates mild right lower extremity arterial disease with right TBI abnormal; left ABI indicates  moderate left lower extremity arterial disease with left TBI abnormal.  Continue to monitor with annual studies. Risk factor modification with HR, BP, and cholesterol control. Continue Elqiuis and Praluent.   Carotid artery disease --S/p right carotid endarterectomy ectomy with history of carotid disease.  No report of dizziness/presyncope or amaurosis fugax.  No bruit on exam. Most recent 07/21/2020 studies are not yet resulted.  10/2019 carotid studies as above.  Studies show RICA 1 to 89%H, LICA 40 to 73%S.  Risk factor modification with HR/BP/cholesterol control.  Continue current Eliquis and Praluent.  Continue annual studies to monitor.  Gout --Continue medications per PCP  Medication changes: None. Labs ordered: Lipid and liver, TSH. Studies / Imaging ordered: Echo Future considerations: When to discontinue ASA Disposition: RTC 4 months or sooner if indicated   Arvil Chaco, PA-C 07/24/2020

## 2020-07-24 NOTE — Telephone Encounter (Signed)
Eliquis 5mg  refill request received. Patient is 70 years old, weight-83.5kg, Crea-1.50 on 06/29/20, Diagnosis-Afib, and last seen by Marrianne Mood, PA on today. Dose is appropriate based on dosing criteria. Will send in refill to requested pharmacy.

## 2020-07-24 NOTE — Telephone Encounter (Signed)
Pt in office today and requests refill of Eliquis.  Forwarding to [facility].

## 2020-07-24 NOTE — Addendum Note (Signed)
Addended by: Darlyne Russian on: 07/24/2020 01:56 PM   Modules accepted: Orders

## 2020-07-25 LAB — HEPATIC FUNCTION PANEL
ALT: 49 IU/L — ABNORMAL HIGH (ref 0–44)
AST: 40 IU/L (ref 0–40)
Albumin: 4.5 g/dL (ref 3.8–4.8)
Alkaline Phosphatase: 86 IU/L (ref 44–121)
Bilirubin Total: 0.4 mg/dL (ref 0.0–1.2)
Bilirubin, Direct: 0.14 mg/dL (ref 0.00–0.40)
Total Protein: 7.3 g/dL (ref 6.0–8.5)

## 2020-07-25 LAB — TSH: TSH: 3.26 u[IU]/mL (ref 0.450–4.500)

## 2020-07-28 ENCOUNTER — Telehealth: Payer: Self-pay | Admitting: *Deleted

## 2020-07-28 NOTE — Telephone Encounter (Signed)
-----   Message from Arvil Chaco, PA-C sent at 07/27/2020  8:11 AM EDT ----- Amiodarone baseline labs --TSH wnl --ALT slightly elevated. Will monitor closely with repeat labs already scheduled by Dr. Quentin Ore on review of his chart.

## 2020-07-28 NOTE — Telephone Encounter (Signed)
Spoke to pt. Notified of lab results and recc.  Pt voiced understanding. F/u with Dr. Quentin Ore is 10/07/20 and appt notes reflect repeat CMP.   Pt does report at this time, "extrememly fatigued" and incr weakness since starting Amiodarone on 07/01/20.  Pt states that this is such a significant change in how he feels, it is affecting his daily activity.  Denies shortness of breath.  States SBP running 125-130 and HR in 70s.  Pt states if this continues he does not want to take Amiodarone any further.   Notified pt I will make provider aware and will call back. Pt voiced understanding.

## 2020-07-29 NOTE — Telephone Encounter (Signed)
I spoke with the patient. He is aware of Dr. Mardene Speak recommendations to stop amiodarone and keep his scheduled follow up appointment in August.  The patient voices understanding and is agreeable.

## 2020-07-29 NOTE — Telephone Encounter (Signed)
EKG reviewed by Dr. Quentin Ore.  Per Dr. Quentin Ore- stop amiodarone.

## 2020-08-04 ENCOUNTER — Telehealth: Payer: Self-pay | Admitting: Cardiology

## 2020-08-04 NOTE — Telephone Encounter (Signed)
       [facility] Pre-operative Risk Assessment    Patient Name: [name]  DOB: 03/26/1950  MRN: [id number]   HEARTCARE STAFF: - Please ensure there is not already an duplicate clearance open for this procedure. - Under Visit Info/Reason for Call, type in Other and utilize the format Clearance MM/DD/YY or Clearance TBD. Do not use dashes or single digits. - If request is for dental extraction, please clarify the # of teeth to be extracted. - If the patient is currently at the dentist's office, call Pre-Op APP to address. If the patient is not currently in the dentist office, please route to the Pre-Op pool  Request for surgical clearance:  1. What type of surgery is being performed? cleaning  2. When is this surgery scheduled? 08/04/20  3. What type of clearance is required (medical clearance vs. Pharmacy clearance to hold med vs. Both)? Pharmacy  4. Are there any medications that need to be held prior to surgery and how [place]? If pt needs to pre med  5. Practice name and name of physician performing surgery? Dr. Posey Pronto [facility]  6. What is the office phone number? [phone]   7.   What is the office fax number? [phone]  8.   Anesthesia type (None, local, MAC, general) ? None   Angeline S Hammer 08/04/2020, 8:09 AM  _________________________________________________________________   (provider comments below)

## 2020-08-04 NOTE — Telephone Encounter (Signed)
   Patient Name: [name]    Primary Cardiologist: Nelva Bush, MD  Chart reviewed as part of pre-operative protocol coverage.   Simple dental extractions are considered low risk procedures per guidelines and generally do not require any specific cardiac clearance. It is also generally accepted that for simple extractions and dental cleanings, there is no need to interrupt blood thinner therapy.  SBE prophylaxis is not required for the patient from a cardiac standpoint.  I will route this recommendation to the requesting party via Epic fax function and remove from pre-op pool.  Please call with questions.  Kathyrn Drown, NP 08/04/2020, 8:19 AM

## 2020-08-05 ENCOUNTER — Telehealth: Payer: Self-pay

## 2020-08-05 NOTE — Telephone Encounter (Signed)
Patient had chest CT scan in September 2021, so he is not due for annual lung screening CT scan yet.

## 2020-08-06 ENCOUNTER — Encounter: Payer: Self-pay | Admitting: Internal Medicine

## 2020-08-09 ENCOUNTER — Other Ambulatory Visit: Payer: Self-pay

## 2020-08-10 ENCOUNTER — Other Ambulatory Visit: Payer: Self-pay

## 2020-08-10 ENCOUNTER — Ambulatory Visit (INDEPENDENT_AMBULATORY_CARE_PROVIDER_SITE_OTHER): Payer: PPO

## 2020-08-10 DIAGNOSIS — I2510 Atherosclerotic heart disease of native coronary artery without angina pectoris: Secondary | ICD-10-CM | POA: Diagnosis not present

## 2020-08-10 DIAGNOSIS — Z951 Presence of aortocoronary bypass graft: Secondary | ICD-10-CM | POA: Diagnosis not present

## 2020-08-10 DIAGNOSIS — I255 Ischemic cardiomyopathy: Secondary | ICD-10-CM | POA: Diagnosis not present

## 2020-08-10 LAB — ECHOCARDIOGRAM COMPLETE
AR max vel: 2.98 cm2
AV Area VTI: 2.57 cm2
AV Area mean vel: 2.49 cm2
AV Mean grad: 4 mmHg
AV Peak grad: 7.1 mmHg
Ao pk vel: 1.33 m/s
Area-P 1/2: 3.48 cm2
Calc EF: 48.8 %
S' Lateral: 4.6 cm
Single Plane A2C EF: 43.5 %
Single Plane A4C EF: 49.9 %

## 2020-08-12 ENCOUNTER — Telehealth: Payer: Self-pay | Admitting: Physician Assistant

## 2020-08-12 NOTE — Telephone Encounter (Signed)
Arvil Chaco, PA-C  08/11/2020  6:08 PM EDT      Good news! - Echo shows improvement in pump function from previous 30 to 35% (02/2020) tolow normal function at 50 to 55%.   --The walls of the heart are moving normally, which is reassuring.   --The bottom and top left chambers of the heart are mildly larger in size thannormal.  We will continue to monitor this with periodic echo.   --The mitral valve is mildly leaky but not stiff.  The aortic valve is trivially leaky with mild thickening of the valve but also not stiff.  These valve findings are not concerning, and we will continue to monitor the valveswith periodic echoes moving forward.     Overall, these are very reassuring findings. If questions, however, we can certainly discuss this echo at an upcoming visit.

## 2020-08-12 NOTE — Telephone Encounter (Signed)
Attempted to all the patient with results. No answer- I left a detailed message of results on the patient's voice mail (okper DPR). I asked that he call back with any questions or concerns.

## 2020-08-18 ENCOUNTER — Telehealth: Payer: Self-pay | Admitting: *Deleted

## 2020-08-18 DIAGNOSIS — Z006 Encounter for examination for normal comparison and control in clinical research program: Secondary | ICD-10-CM

## 2020-08-18 NOTE — Telephone Encounter (Signed)
I called patient for end of study visit. I reconciled adverse events and concomitant medications. I thanked patient for participating in the study. I reminded I would call patient in 30 days post study. Patient has been off study medication since August 15,2020. We had been following patient by phone calls per protocol.

## 2020-08-25 ENCOUNTER — Telehealth: Payer: Self-pay | Admitting: Internal Medicine

## 2020-08-25 NOTE — Telephone Encounter (Signed)
Please call to discuss prior authorization that has expired for Praluent.

## 2020-08-25 NOTE — Telephone Encounter (Signed)
He was using a [employer] but his grant expired. Unfortunately, the fund has closed for the time being.  Spoke with patient and wife. Sounds like they will qualify for assistance through drug company. Walked them through how to apply online. They will call back if they need help.

## 2020-08-25 NOTE — Telephone Encounter (Signed)
Spoke with pt's pharmacy and they stated pt's assistance card has expired.  I do not see where we have sent in an application, per pharmacy, this would be a card and just needs updated.  Pt states that he does have 30 day supply on hand of Praluent.  Notified pt I will reach out to PharmD to assist with what is needed.  Pt appreciative and no further needs at this time.

## 2020-08-26 ENCOUNTER — Telehealth: Payer: Self-pay

## 2020-08-26 NOTE — Telephone Encounter (Signed)
Incoming call from [place] with [employer] [facility] [place] stated that a patient assistance application was sent in for patient and page 2 (prescription) was missing.   She stated that in order to process the application we will need to complete page two or we can fax a prescription but it has to be hand dated and signed.   She stated we can fax this too [phone] Attn: [employer]

## 2020-08-27 NOTE — Telephone Encounter (Signed)
Received a secure chat from [facility], [facility]  "I have the Rx for this guy filled out. if I fax it to you, can you have Dr. Saunders Revel sign and either fax back to me or fax to [employer]?"  Fax was received, Signed by Dr. Saunders Revel, and faxed too [employer].

## 2020-09-01 ENCOUNTER — Telehealth: Payer: Self-pay | Admitting: Internal Medicine

## 2020-09-01 MED ORDER — PRALUENT 150 MG/ML ~~LOC~~ SOAJ: 1.0000 "pen " | SUBCUTANEOUS | 2 refills | Status: DC

## 2020-09-01 NOTE — Telephone Encounter (Signed)
Please call to discuss application for Praluent. States there is a missing prescription. Please call to discuss.  Fax [phone]

## 2020-09-01 NOTE — Telephone Encounter (Signed)
Printed New Rx and placed in [place], [place] for her signature. Rx needs to be faxed to requested fax number [phone].    Requested Prescriptions   Signed Prescriptions Disp Refills   Alirocumab (PRALUENT) 150 MG/ML SOAJ 2 mL 2    Sig: Inject 1 pen into the skin every 14 (fourteen) days.    Authorizing Provider: Marrianne Mood D    Ordering User: Britt Bottom

## 2020-09-04 NOTE — Telephone Encounter (Signed)
Praluent , [name], calling back in to check on status of the standalone prescription hand signed and hand dated. Please fax to [phone]  Please advise

## 2020-09-04 NOTE — Telephone Encounter (Addendum)
Signing pt rx for Praluent. Please fax to the appropriate personnel.

## 2020-09-04 NOTE — Telephone Encounter (Signed)
Prescription is sitting on your desk for signature.

## 2020-09-07 NOTE — Telephone Encounter (Signed)
Patient calling in to check status of the prescription being signed and sent back

## 2020-09-08 ENCOUNTER — Telehealth: Payer: Self-pay | Admitting: *Deleted

## 2020-09-08 NOTE — Telephone Encounter (Signed)
Just completed the post end of study phone call/chart review for the CLEAR research study. All concomitant medications have been reviewed and updated in the [facility] system if applicable. There are no new AE's or SAE's to report to sponsor at this time.

## 2020-09-08 NOTE — Telephone Encounter (Signed)
Rx for Praluent signed yesterday and was faxed to [employer] @ [phone].  I called this morning to follow up and representative confirmed that they did receive Rx and also stated they were missing dx code on application. Dx Codes provided for Hyperlipidemia and Statin Intolerance.  Rep confirmed they have all that is needed to process application. And that we may call back to follow up on status if have not heard anything by end of this week @ [phone].  Called pt and made aware of status update.  Pt appreciative and has no further questions/concerns at this time.

## 2020-09-09 NOTE — Telephone Encounter (Signed)
Received fax confirming pt is approved for Praluent pt assistance as of 09/08/20 through 02/27/21.  Pt is aware. Paperwork placed in [employer].

## 2020-09-14 ENCOUNTER — Telehealth (INDEPENDENT_AMBULATORY_CARE_PROVIDER_SITE_OTHER): Payer: Self-pay | Admitting: Vascular Surgery

## 2020-09-14 ENCOUNTER — Other Ambulatory Visit (INDEPENDENT_AMBULATORY_CARE_PROVIDER_SITE_OTHER): Payer: Self-pay | Admitting: Nurse Practitioner

## 2020-09-14 DIAGNOSIS — M79662 Pain in left lower leg: Secondary | ICD-10-CM

## 2020-09-14 NOTE — Telephone Encounter (Signed)
Patient called and scheduled.

## 2020-09-14 NOTE — Telephone Encounter (Signed)
Called stating that he is having calf pain in his lle. Patient states that his toes are cold/numb. Patient states that this started Friday 09/11/20. Patient would like to come in to be seen. Patient was last seen 07/21/20 with abi and carotid studies with FB. Please advise.

## 2020-09-14 NOTE — Telephone Encounter (Signed)
Bring him in with ABIs, today or tomorrow

## 2020-09-15 ENCOUNTER — Ambulatory Visit (INDEPENDENT_AMBULATORY_CARE_PROVIDER_SITE_OTHER): Payer: PPO | Admitting: Nurse Practitioner

## 2020-09-15 ENCOUNTER — Ambulatory Visit (INDEPENDENT_AMBULATORY_CARE_PROVIDER_SITE_OTHER): Payer: PPO

## 2020-09-15 ENCOUNTER — Other Ambulatory Visit: Payer: Self-pay

## 2020-09-15 ENCOUNTER — Telehealth (INDEPENDENT_AMBULATORY_CARE_PROVIDER_SITE_OTHER): Payer: Self-pay

## 2020-09-15 ENCOUNTER — Encounter (INDEPENDENT_AMBULATORY_CARE_PROVIDER_SITE_OTHER): Payer: Self-pay | Admitting: Nurse Practitioner

## 2020-09-15 VITALS — BP 158/88 | HR 73 | Resp 16 | Wt 182.4 lb

## 2020-09-15 DIAGNOSIS — E785 Hyperlipidemia, unspecified: Secondary | ICD-10-CM

## 2020-09-15 DIAGNOSIS — M79662 Pain in left lower leg: Secondary | ICD-10-CM

## 2020-09-15 DIAGNOSIS — I10 Essential (primary) hypertension: Secondary | ICD-10-CM | POA: Diagnosis not present

## 2020-09-15 DIAGNOSIS — I70229 Atherosclerosis of native arteries of extremities with rest pain, unspecified extremity: Secondary | ICD-10-CM

## 2020-09-15 NOTE — Telephone Encounter (Signed)
Spoke with the patient and he is scheduled with Dr. Lucky Cowboy for a LLE angio on 09/17/20 with a 9:00 am arrival time to the [facility]. Pre-procedure instructions were discussed and handed to patient.

## 2020-09-15 NOTE — H&P (View-Only) (Signed)
Subjective:    Patient ID: [name], male    DOB: 1951/01/31, 70 y.o.   MRN: [id number] Chief Complaint  Patient presents with   Follow-up    Ultrasound follow up    [name] is a 70 year old male that returns to the office for followup and review of the noninvasive studies. There has been a significant deterioration in the lower extremity symptoms.  The patient notes interval shortening of their claudication distance and development of mild rest pain symptoms. No new ulcers or wounds have occurred since the last visit.  The symptoms all occur following a weekend of heavy physical activity.  The patient was lifting very heavy items as well as doing strenuous exercise in very high temperatures.  The patient notes that he began having pain in the calf as well as coldness of his toes immediately after resting from these events.  There have been no significant changes to the patient's overall health care.  The patient denies amaurosis fugax or recent TIA symptoms. There are no recent neurological changes noted. The patient denies history of DVT, PE or superficial thrombophlebitis. The patient denies recent episodes of angina or shortness of breath.   ABI's Rt=1.24 and Lt=0.76 (previous ABI's Rt=1.15 and Lt=1.09) the patient has a TBI of 0.00 on the left Duplex US of the lower extremity arterial system shows triphasic tibial artery waveforms on the right with monophasic waveforms on the left.  The patient's right great toe has good waveforms whereas the left is flat.   Review of Systems  Cardiovascular:        Claudication  Musculoskeletal:  Positive for myalgias.  All other systems reviewed and are negative.     Objective:   Physical Exam Vitals reviewed.  HENT:     Head: Normocephalic.  Cardiovascular:     Rate and Rhythm: Normal rate.     Pulses:          Dorsalis pedis pulses are 1+ on the right side and 0 on the left side.       Posterior tibial pulses are 1+ on the  right side and 0 on the left side.  Pulmonary:     Effort: Pulmonary effort is normal.  Musculoskeletal:     Right lower leg: No edema.     Left lower leg: No edema.  Skin:    General: Skin is cool.  Neurological:     Mental Status: He is alert and oriented to person, place, and time.  Psychiatric:        Mood and Affect: Mood normal.        Behavior: Behavior normal.        Thought Content: Thought content normal.        Judgment: Judgment normal.    BP (!) 158/88 (BP Location: Right Arm)   Pulse 73   Resp 16   Wt 182 lb 6.4 oz (82.7 kg)   BMI 29.44 kg/m   Past Medical History:  Diagnosis Date   Abnormal nuclear cardiac imaging test 08/08/2015   Arthritis    fingers   Carotid artery occlusion    CHF (congestive heart failure) (HCC)    Coronary atherosclerosis of native coronary artery 01/29/2013   11/05/19 R/LHC 80% dLMCA stenosis small diffusely dz dLAD, chronically occluded OM1 50%mRCA lesion, widely patent mLCx strent, moderately elevated L heart filling pressures, mild to moderate RH filling pressures, normal to moderately reduced CO   Duodenal erosion    Encounter for screening  for lung cancer 07/13/2016   Esophageal stenosis    esophageal dilation   GERD (gastroesophageal reflux disease)    H. pylori infection    Heart attack [facility]) Oct. 2009   Mild   Hiatal hernia    Hyperlipidemia    Hypertension    Old myocardial infarction 11/29/2007   Mildly elevated troponin, isolated value in October 2009. Cardiac catheterization-nonobstructive 60% RCA disease-subsequent nuclear stress test-9 minutes, low risk, mild inferior wall hypokinesis    Pain in limb 12/19/2017   Peripheral vascular disease ([place])    Unstable angina ([place]) 11/25/2017    Social History   Socioeconomic History   Marital status: Married    Spouse name: Not on file   Number of children: 1   Years of education: Not on file   Highest education level: Bachelor's degree (e.g., BA, AB, BS)  Occupational  History   Not on file  Tobacco Use   Smoking status: Former    Packs/day: 1.25    Years: 35.00    Pack years: 43.75    Types: Cigarettes    Quit date: 02/28/2005    Years since quitting: 15.5   Smokeless tobacco: Current    Types: Snuff   Tobacco comments:    occaisionally  Vaping Use   Vaping Use: Never used  Substance and Sexual Activity   Alcohol use: Yes    Alcohol/week: 8.0 - 10.0 standard drinks    Types: 8 - 10 Glasses of wine per week    Comment: weekly   Drug use: No   Sexual activity: Yes  Other Topics Concern   Not on file  Social History Narrative   Not on file   Social Determinants of Health   Financial Resource Strain: Low Risk    Difficulty of Paying Living Expenses: Not hard at all  Food Insecurity: No Food Insecurity   Worried About Charity fundraiser in the Last Year: Never true   Ran Out of Food in the Last Year: Never true  Transportation Needs: No Transportation Needs   Lack of Transportation (Medical): No   Lack of Transportation (Non-Medical): No  Physical Activity: Sufficiently Active   Days of Exercise per Week: 4 days   Minutes of Exercise per Session: 60 min  Stress: No Stress Concern Present   Feeling of Stress : Not at all  Social Connections: Moderately Integrated   Frequency of Communication with Friends and Family: More than three times a week   Frequency of Social Gatherings with Friends and Family: Three times a week   Attends Religious Services: Never   Active Member of Clubs or Organizations: Yes   Attends Music therapist: More than 4 times per year   Marital Status: Married  Human resources officer Violence: Not At Risk   Fear of Current or Ex-Partner: No   Emotionally Abused: No   Physically Abused: No   Sexually Abused: No    Past Surgical History:  Procedure Laterality Date   APPENDECTOMY     BACK SURGERY     BIV ICD INSERTION CRT-D N/A 03/23/2020   Procedure: BIV ICD INSERTION CRT-D;  Surgeon: Vickie Epley, MD;  Location: [place] [facility];  Service: Cardiovascular;  Laterality: N/A;   CARDIAC CATHETERIZATION N/A 08/07/2015   Procedure: Left Heart Cath and Coronary Angiography;  Surgeon: [name], MD;  Location: [place] [facility];  Service: Cardiovascular;  Laterality: N/A;   CARDIAC CATHETERIZATION N/A 08/07/2015   Procedure: Coronary Stent  Intervention;  Surgeon: [name], MD;  Location: [place] [facility];  Service: Cardiovascular;  Laterality: N/A;   CARDIAC CATHETERIZATION N/A 08/07/2015   Procedure: Coronary Stent Intervention;  Surgeon: [name], MD;  Location: [place] [facility];  Service: Cardiovascular;  Laterality: N/A;   CAROTID ENDARTERECTOMY  01/05/2006   Right  CEA with DPA   CATARACT EXTRACTION W/ INTRAOCULAR LENS IMPLANT Left 12/04/2017   CATARACT EXTRACTION W/PHACO Left 12/04/2017   Procedure: CATARACT EXTRACTION PHACO AND INTRAOCULAR LENS PLACEMENT ([place]) LEFT;  Surgeon: Eulogio Bear, MD;  Location: [place];  Service: Ophthalmology;  Laterality: Left;   CATARACT EXTRACTION W/PHACO Right 02/06/2018   Procedure: CATARACT EXTRACTION PHACO AND INTRAOCULAR LENS PLACEMENT (I[facility])RIGHT;  Surgeon: Eulogio Bear, MD;  Location: [place];  Service: Ophthalmology;  Laterality: Right;   COLONOSCOPY  05/20/2008   COLONOSCOPY WITH PROPOFOL N/A 09/13/2018   Procedure: COLONOSCOPY WITH BIOPSY;  Surgeon: [name], MD;  Location: [place];  Service: Endoscopy;  Laterality: N/A;   CORONARY ARTERY BYPASS GRAFT N/A 11/11/2019   Procedure: CORONARY ARTERY BYPASS GRAFTING (CABG) USING LIMA to Diag1; ENDOSCOPICALLY HARVESTED RIGHT GREATER SAPHENOUS VEIN: SVG to OM1; SVG to OM2; SVG to PDA.;  Surgeon: Ivin Poot, MD;  Location: [place];  Service: Open Heart Surgery;  Laterality: N/A;   CORONARY STENT PLACEMENT  08/07/2015   MID CIRCUMFLEX   ENDOVEIN HARVEST OF GREATER SAPHENOUS VEIN Right 11/11/2019   Procedure: ENDOVEIN HARVEST OF GREATER  SAPHENOUS VEIN;  Surgeon: Ivin Poot, MD;  Location: [place];  Service: Open Heart Surgery;  Laterality: Right;   ESOPHAGOGASTRODUODENOSCOPY (EGD) WITH PROPOFOL N/A 02/11/2019   Procedure: ESOPHAGOGASTRODUODENOSCOPY (EGD) WITH BIOPSY and  Dilation;  Surgeon: [name], MD;  Location: [place];  Service: Endoscopy;  Laterality: N/A;   HIP SURGERY Left 10/2016   left hip tendon repair   LEFT HEART CATH AND CORONARY ANGIOGRAPHY N/A 11/27/2017   Procedure: LEFT HEART CATH AND CORONARY ANGIOGRAPHY;  Surgeon: Wellington Hampshire, MD;  Location: [place] [facility];  Service: Cardiovascular;  Laterality: N/A;   LOWER EXTREMITY ANGIOGRAPHY Left 02/12/2018   Procedure: LOWER EXTREMITY ANGIOGRAPHY;  Surgeon: Algernon Huxley, MD;  Location: [place] [facility];  Service: Cardiovascular;  Laterality: Left;   LOWER EXTREMITY ANGIOGRAPHY Left 03/07/2018   Procedure: LOWER EXTREMITY ANGIOGRAPHY;  Surgeon: Algernon Huxley, MD;  Location: [place] [facility];  Service: Cardiovascular;  Laterality: Left;   LOWER EXTREMITY ANGIOGRAPHY Left 06/04/2018   Procedure: LOWER EXTREMITY ANGIOGRAPHY;  Surgeon: Algernon Huxley, MD;  Location: [place] [facility];  Service: Cardiovascular;  Laterality: Left;   PLACEMENT OF IMPELLA LEFT VENTRICULAR ASSIST DEVICE N/A 11/11/2019   Procedure: PLACEMENT OF IMPELLA LEFT VENTRICULAR ASSIST DEVICE 5.5;  Surgeon: Ivin Poot, MD;  Location: [place];  Service: Open Heart Surgery;  Laterality: N/A;  Midline Sternotomy   POLYPECTOMY N/A 09/13/2018   Procedure: POLYPECTOMY;  Surgeon: [name], MD;  Location: [place];  Service: Endoscopy;  Laterality: N/A;   POLYPECTOMY N/A 02/11/2019   Procedure: POLYPECTOMY;  Surgeon: [name], MD;  Location: [place];  Service: Endoscopy;  Laterality: N/A;   REMOVAL OF IMPELLA LEFT VENTRICULAR ASSIST DEVICE N/A 11/15/2019   Procedure: REMOVAL OF IMPELLA 5.5 LEFT VENTRICULAR ASSIST DEVICE;  Surgeon: Ivin Poot,  MD;  Location: [place];  Service: Open Heart Surgery;  Laterality: N/A;   RIGHT/LEFT HEART CATH AND CORONARY ANGIOGRAPHY N/A 11/05/2019  Procedure: RIGHT/LEFT HEART CATH AND CORONARY ANGIOGRAPHY;  Surgeon: Nelva Bush, MD;  Location: [place] [facility];  Service: Cardiovascular;  Laterality: N/A;   SPINE SURGERY     TEE WITHOUT CARDIOVERSION N/A 11/11/2019   Procedure: TRANSESOPHAGEAL ECHOCARDIOGRAM (TEE);  Surgeon: Prescott Gum, Collier Salina, MD;  Location: [place];  Service: Open Heart Surgery;  Laterality: N/A;   TEE WITHOUT CARDIOVERSION N/A 11/15/2019   Procedure: TRANSESOPHAGEAL ECHOCARDIOGRAM (TEE);  Surgeon: Prescott Gum, Collier Salina, MD;  Location: [place];  Service: Open Heart Surgery;  Laterality: N/A;   TONSILLECTOMY      Family History  Problem Relation Age of Onset   Heart attack Mother    Coronary artery disease Mother    Heart disease Mother        Carotid Stenosis and BPG and Heart Disease before age 62   Diabetes Mother    Hypertension Mother    Heart attack Father    Heart disease Father        BPG and Heart Disease before age 21   Hypertension Father    Cancer Father 7       throat   Stroke Father    Colon cancer Neg Hx    Colon polyps Neg Hx    Esophageal cancer Neg Hx    Rectal cancer Neg Hx    Stomach cancer Neg Hx     Allergies  Allergen Reactions   Brilinta [Ticagrelor] Shortness Of Breath   Chlorhexidine Gluconate Other (See Comments)    Skin burning for hours afterward   Statins Other (See Comments)    Failed Crestor 5 mg twice weekly, Crestor 20 mg daily, Pravastatin 40 mg qd, Lipitor, Zocor - muscle aches   Zetia [Ezetimibe] Other (See Comments)    Muscle aches    CBC Latest Ref Rng & Units 03/02/2020 12/04/2019 11/21/2019  WBC 4.0 - 10.5 K/uL 4.1 5.3 10.2  Hemoglobin 13.0 - 17.0 g/dL 14.0 11.9(L) 10.8(L)  Hematocrit 39.0 - 52.0 % 44.6 35.3(L) 34.6(L)  Platelets 150 - 400 K/uL 192 326 495(H)      CMP     Component Value Date/Time   NA 141 06/29/2020 0906    K 5.4 (H) 06/29/2020 0906   CL 100 06/29/2020 0906   CO2 25 06/29/2020 0906   GLUCOSE 102 (H) 06/29/2020 0906   GLUCOSE 109 (H) 03/21/2020 0855   BUN 42 (H) 06/29/2020 0906   CREATININE 1.50 (H) 06/29/2020 0906   CREATININE 1.12 07/23/2015 1016   CALCIUM 9.4 06/29/2020 0906   PROT 7.3 07/24/2020 0856   ALBUMIN 4.5 07/24/2020 0856   AST 40 07/24/2020 0856   ALT 49 (H) 07/24/2020 0856   ALKPHOS 86 07/24/2020 0856   BILITOT 0.4 07/24/2020 0856   GFRNONAA 73 04/22/2020 1013   GFRNONAA 51 (L) 03/21/2020 0855   GFRAA 85 04/22/2020 1013     VAS [place] ABI WITH/WO TBI  Result Date: 07/28/2020  LOWER EXTREMITY DOPPLER STUDY Patient Name:  ALEK BORGES  Date of Exam:   07/21/2020 Medical Rec #: [id number]     Accession #:    [id number] Date of Birth: December 01, 1950    Patient Gender: M Patient Age:   69Y Exam Location:  [facility] Procedure:      VAS [place] ABI WITH/WO TBI Referring Phys: [id number] Lucretia Roers [place] --------------------------------------------------------------------------------  Indications: Rest pain, peripheral artery disease, and No complaints at this              time  06/04/2018 Left SFA to TP trunk thrombectomy, and stent.  Vascular Interventions: 03/07/2018 PTA left distal SFA and popliteal artery. Stent                         in the left popliteal artery. Comparison Study: 01/18/2019 Performing Technologist: Charlane Ferretti RT (R)(VS)  Examination Guidelines: A complete evaluation includes at minimum, Doppler waveform signals and systolic blood pressure reading at the level of bilateral brachial, anterior tibial, and posterior tibial arteries, when vessel segments are accessible. Bilateral testing is considered an integral part of a complete examination. Photoelectric Plethysmograph (PPG) waveforms and toe systolic pressure readings are included as required and additional duplex testing as needed. Limited examinations for reoccurring indications may be performed as  noted.  ABI Findings: +---------+------------------+-----+---------+--------+ Right    Rt Pressure (mmHg)IndexWaveform Comment  +---------+------------------+-----+---------+--------+ Brachial 132                                      +---------+------------------+-----+---------+--------+ ATA      160               1.15 triphasic         +---------+------------------+-----+---------+--------+ PTA      141               1.01 triphasic         +---------+------------------+-----+---------+--------+ Great Toe78                0.56 Abnormal          +---------+------------------+-----+---------+--------+ +---------+------------------+-----+---------+-------+ Left     Lt Pressure (mmHg)IndexWaveform Comment +---------+------------------+-----+---------+-------+ Brachial 139                                     +---------+------------------+-----+---------+-------+ ATA      126               0.91 triphasic        +---------+------------------+-----+---------+-------+ PTA      151               1.09 triphasic        +---------+------------------+-----+---------+-------+ Great Toe92                0.66 Abnormal         +---------+------------------+-----+---------+-------+ +-------+-----------+-----------+------------+------------+ ABI/TBIToday's ABIToday's TBIPrevious ABIPrevious TBI +-------+-----------+-----------+------------+------------+ Right  1.15       .56        1.0         .53          +-------+-----------+-----------+------------+------------+ Left   1.09       .66        .96         .65          +-------+-----------+-----------+------------+------------+ Bilateral ABIs appear essentially unchanged compared to prior study on 01/18/2019. Bilateral TBIs appear essentially unchanged compared to prior study on 01/18/2019.  Summary: Right: Resting right ankle-brachial index is within normal range. No evidence of significant right lower  extremity arterial disease. The right toe-brachial index is abnormal. Left: Resting left ankle-brachial index is within normal range. No evidence of significant left lower extremity arterial disease. The left toe-brachial index is abnormal.  *See table(s) above for measurements and observations.  Electronically signed by [name] MD on 07/28/2020 at 12:42:22 PM.    Final  Assessment & Plan:   1. Atherosclerosis of artery of extremity with rest pain ([place]) Recommend:  The patient has evidence of severe atherosclerotic changes of both lower extremities with rest pain that is associated with preulcerative changes and impending tissue loss of the foot.  This represents a limb threatening ischemia and places the patient at the risk for limb loss.  Patient should undergo angiography of the lower extremities with the hope for intervention for limb salvage.  The risks and benefits as well as the alternative therapies was discussed in detail with the patient.  All questions were answered.  Patient agrees to proceed with angiography.  The patient will follow up with me in the office after the procedure.       2. Essential hypertension Continue antihypertensive medications as already ordered, these medications have been reviewed and there are no changes at this time.   3. Hyperlipidemia LDL goal <70 Continue statin as ordered and reviewed, no changes at this time    Current Outpatient Medications on File Prior to Visit  Medication Sig Dispense Refill   Alirocumab (PRALUENT) 150 MG/ML SOAJ Inject 1 pen into the skin every 14 (fourteen) days. 2 mL 2   apixaban (ELIQUIS) 5 MG TABS tablet Take 1 tablet (5 mg total) by mouth 2 (two) times daily. 180 tablet 1   carvedilol (COREG) 12.5 MG tablet Take 1 tablet (12.5 mg total) by mouth 2 (two) times daily. 180 tablet 3   colchicine 0.6 MG tablet Take 1 tablet (0.6 mg total) by mouth daily as needed (Gout). 30 tablet 0   nitroGLYCERIN (NITROSTAT) 0.4  MG SL tablet Place 1 tablet (0.4 mg total) under the tongue every 5 (five) minutes as needed for chest pain. 25 tablet 2   pantoprazole (PROTONIX) 40 MG tablet TAKE 1 TABLET (40 MG TOTAL) BY MOUTH DAILY. **PLEASE SCHEDULE FOLLOW UP APPT** 30 tablet 0   sacubitril-valsartan (ENTRESTO) 49-51 MG Take 1 tablet by mouth 2 (two) times daily. 180 tablet 3   traMADol (ULTRAM) 50 MG tablet Take 1 tablet (50 mg total) by mouth every 6 (six) hours as needed. 40 tablet 0   traZODone (DESYREL) 50 MG tablet TAKE 1 TABLET BY MOUTH AT BEDTIME AS NEEDED FOR SLEEP. 90 tablet 1   triamcinolone cream (KENALOG) 0.1 % Apply 1 application topically 2 (two) times daily as needed for itching.     torsemide (DEMADEX) 10 MG tablet Take 1 tablet (10 mg total) by mouth daily. (Patient not taking: No sig reported) 30 tablet 5   No current facility-administered medications on file prior to visit.    There are no Patient Instructions on file for this visit. No follow-ups on file.   Kris Hartmann, NP

## 2020-09-15 NOTE — Progress Notes (Signed)
Subjective:    Patient ID: [name], male    DOB: 1950-11-26, 70 y.o.   MRN: [id number] Chief Complaint  Patient presents with   Follow-up    Ultrasound follow up    [name] is a 70 year old male that returns to the office for followup and review of the noninvasive studies. There has been a significant deterioration in the lower extremity symptoms.  The patient notes interval shortening of their claudication distance and development of mild rest pain symptoms. No new ulcers or wounds have occurred since the last visit.  The symptoms all occur following a weekend of heavy physical activity.  The patient was lifting very heavy items as well as doing strenuous exercise in very high temperatures.  The patient notes that he began having pain in the calf as well as coldness of his toes immediately after resting from these events.  There have been no significant changes to the patient's overall health care.  The patient denies amaurosis fugax or recent TIA symptoms. There are no recent neurological changes noted. The patient denies history of DVT, PE or superficial thrombophlebitis. The patient denies recent episodes of angina or shortness of breath.   ABI's Rt=1.24 and Lt=0.76 (previous ABI's Rt=1.15 and Lt=1.09) the patient has a TBI of 0.00 on the left Duplex US of the lower extremity arterial system shows triphasic tibial artery waveforms on the right with monophasic waveforms on the left.  The patient's right great toe has good waveforms whereas the left is flat.   Review of Systems  Cardiovascular:        Claudication  Musculoskeletal:  Positive for myalgias.  All other systems reviewed and are negative.     Objective:   Physical Exam Vitals reviewed.  HENT:     Head: Normocephalic.  Cardiovascular:     Rate and Rhythm: Normal rate.     Pulses:          Dorsalis pedis pulses are 1+ on the right side and 0 on the left side.       Posterior tibial pulses are 1+ on the  right side and 0 on the left side.  Pulmonary:     Effort: Pulmonary effort is normal.  Musculoskeletal:     Right lower leg: No edema.     Left lower leg: No edema.  Skin:    General: Skin is cool.  Neurological:     Mental Status: He is alert and oriented to person, place, and time.  Psychiatric:        Mood and Affect: Mood normal.        Behavior: Behavior normal.        Thought Content: Thought content normal.        Judgment: Judgment normal.    BP (!) 158/88 (BP Location: Right Arm)   Pulse 73   Resp 16   Wt 182 lb 6.4 oz (82.7 kg)   BMI 29.44 kg/m   Past Medical History:  Diagnosis Date   Abnormal nuclear cardiac imaging test 08/08/2015   Arthritis    fingers   Carotid artery occlusion    CHF (congestive heart failure) (HCC)    Coronary atherosclerosis of native coronary artery 01/29/2013   11/05/19 R/LHC 80% dLMCA stenosis small diffusely dz dLAD, chronically occluded OM1 50%mRCA lesion, widely patent mLCx strent, moderately elevated L heart filling pressures, mild to moderate RH filling pressures, normal to moderately reduced CO   Duodenal erosion    Encounter for screening  for lung cancer 07/13/2016   Esophageal stenosis    esophageal dilation   GERD (gastroesophageal reflux disease)    H. pylori infection    Heart attack [facility]) Oct. 2009   Mild   Hiatal hernia    Hyperlipidemia    Hypertension    Old myocardial infarction 11/29/2007   Mildly elevated troponin, isolated value in October 2009. Cardiac catheterization-nonobstructive 60% RCA disease-subsequent nuclear stress test-9 minutes, low risk, mild inferior wall hypokinesis    Pain in limb 12/19/2017   Peripheral vascular disease ([place])    Unstable angina ([place]) 11/25/2017    Social History   Socioeconomic History   Marital status: Married    Spouse name: Not on file   Number of children: 1   Years of education: Not on file   Highest education level: Bachelor's degree (e.g., BA, AB, BS)  Occupational  History   Not on file  Tobacco Use   Smoking status: Former    Packs/day: 1.25    Years: 35.00    Pack years: 43.75    Types: Cigarettes    Quit date: 02/28/2005    Years since quitting: 15.5   Smokeless tobacco: Current    Types: Snuff   Tobacco comments:    occaisionally  Vaping Use   Vaping Use: Never used  Substance and Sexual Activity   Alcohol use: Yes    Alcohol/week: 8.0 - 10.0 standard drinks    Types: 8 - 10 Glasses of wine per week    Comment: weekly   Drug use: No   Sexual activity: Yes  Other Topics Concern   Not on file  Social History Narrative   Not on file   Social Determinants of Health   Financial Resource Strain: Low Risk    Difficulty of Paying Living Expenses: Not hard at all  Food Insecurity: No Food Insecurity   Worried About Charity fundraiser in the Last Year: Never true   Ran Out of Food in the Last Year: Never true  Transportation Needs: No Transportation Needs   Lack of Transportation (Medical): No   Lack of Transportation (Non-Medical): No  Physical Activity: Sufficiently Active   Days of Exercise per Week: 4 days   Minutes of Exercise per Session: 60 min  Stress: No Stress Concern Present   Feeling of Stress : Not at all  Social Connections: Moderately Integrated   Frequency of Communication with Friends and Family: More than three times a week   Frequency of Social Gatherings with Friends and Family: Three times a week   Attends Religious Services: Never   Active Member of Clubs or Organizations: Yes   Attends Music therapist: More than 4 times per year   Marital Status: Married  Human resources officer Violence: Not At Risk   Fear of Current or Ex-Partner: No   Emotionally Abused: No   Physically Abused: No   Sexually Abused: No    Past Surgical History:  Procedure Laterality Date   APPENDECTOMY     BACK SURGERY     BIV ICD INSERTION CRT-D N/A 03/23/2020   Procedure: BIV ICD INSERTION CRT-D;  Surgeon: Vickie Epley, MD;  Location: [place] [facility];  Service: Cardiovascular;  Laterality: N/A;   CARDIAC CATHETERIZATION N/A 08/07/2015   Procedure: Left Heart Cath and Coronary Angiography;  Surgeon: [name], MD;  Location: [place] [facility];  Service: Cardiovascular;  Laterality: N/A;   CARDIAC CATHETERIZATION N/A 08/07/2015   Procedure: Coronary Stent  Intervention;  Surgeon: [name], MD;  Location: [place] [facility];  Service: Cardiovascular;  Laterality: N/A;   CARDIAC CATHETERIZATION N/A 08/07/2015   Procedure: Coronary Stent Intervention;  Surgeon: [name], MD;  Location: [place] [facility];  Service: Cardiovascular;  Laterality: N/A;   CAROTID ENDARTERECTOMY  01/05/2006   Right  CEA with DPA   CATARACT EXTRACTION W/ INTRAOCULAR LENS IMPLANT Left 12/04/2017   CATARACT EXTRACTION W/PHACO Left 12/04/2017   Procedure: CATARACT EXTRACTION PHACO AND INTRAOCULAR LENS PLACEMENT ([place]) LEFT;  Surgeon: Eulogio Bear, MD;  Location: [place];  Service: Ophthalmology;  Laterality: Left;   CATARACT EXTRACTION W/PHACO Right 02/06/2018   Procedure: CATARACT EXTRACTION PHACO AND INTRAOCULAR LENS PLACEMENT (I[facility])RIGHT;  Surgeon: Eulogio Bear, MD;  Location: [place];  Service: Ophthalmology;  Laterality: Right;   COLONOSCOPY  05/20/2008   COLONOSCOPY WITH PROPOFOL N/A 09/13/2018   Procedure: COLONOSCOPY WITH BIOPSY;  Surgeon: [name], MD;  Location: [place];  Service: Endoscopy;  Laterality: N/A;   CORONARY ARTERY BYPASS GRAFT N/A 11/11/2019   Procedure: CORONARY ARTERY BYPASS GRAFTING (CABG) USING LIMA to Diag1; ENDOSCOPICALLY HARVESTED RIGHT GREATER SAPHENOUS VEIN: SVG to OM1; SVG to OM2; SVG to PDA.;  Surgeon: Ivin Poot, MD;  Location: [place];  Service: Open Heart Surgery;  Laterality: N/A;   CORONARY STENT PLACEMENT  08/07/2015   MID CIRCUMFLEX   ENDOVEIN HARVEST OF GREATER SAPHENOUS VEIN Right 11/11/2019   Procedure: ENDOVEIN HARVEST OF GREATER  SAPHENOUS VEIN;  Surgeon: Ivin Poot, MD;  Location: [place];  Service: Open Heart Surgery;  Laterality: Right;   ESOPHAGOGASTRODUODENOSCOPY (EGD) WITH PROPOFOL N/A 02/11/2019   Procedure: ESOPHAGOGASTRODUODENOSCOPY (EGD) WITH BIOPSY and  Dilation;  Surgeon: [name], MD;  Location: [place];  Service: Endoscopy;  Laterality: N/A;   HIP SURGERY Left 10/2016   left hip tendon repair   LEFT HEART CATH AND CORONARY ANGIOGRAPHY N/A 11/27/2017   Procedure: LEFT HEART CATH AND CORONARY ANGIOGRAPHY;  Surgeon: Wellington Hampshire, MD;  Location: [place] [facility];  Service: Cardiovascular;  Laterality: N/A;   LOWER EXTREMITY ANGIOGRAPHY Left 02/12/2018   Procedure: LOWER EXTREMITY ANGIOGRAPHY;  Surgeon: Algernon Huxley, MD;  Location: [place] [facility];  Service: Cardiovascular;  Laterality: Left;   LOWER EXTREMITY ANGIOGRAPHY Left 03/07/2018   Procedure: LOWER EXTREMITY ANGIOGRAPHY;  Surgeon: Algernon Huxley, MD;  Location: [place] [facility];  Service: Cardiovascular;  Laterality: Left;   LOWER EXTREMITY ANGIOGRAPHY Left 06/04/2018   Procedure: LOWER EXTREMITY ANGIOGRAPHY;  Surgeon: Algernon Huxley, MD;  Location: [place] [facility];  Service: Cardiovascular;  Laterality: Left;   PLACEMENT OF IMPELLA LEFT VENTRICULAR ASSIST DEVICE N/A 11/11/2019   Procedure: PLACEMENT OF IMPELLA LEFT VENTRICULAR ASSIST DEVICE 5.5;  Surgeon: Ivin Poot, MD;  Location: [place];  Service: Open Heart Surgery;  Laterality: N/A;  Midline Sternotomy   POLYPECTOMY N/A 09/13/2018   Procedure: POLYPECTOMY;  Surgeon: [name], MD;  Location: [place];  Service: Endoscopy;  Laterality: N/A;   POLYPECTOMY N/A 02/11/2019   Procedure: POLYPECTOMY;  Surgeon: [name], MD;  Location: [place];  Service: Endoscopy;  Laterality: N/A;   REMOVAL OF IMPELLA LEFT VENTRICULAR ASSIST DEVICE N/A 11/15/2019   Procedure: REMOVAL OF IMPELLA 5.5 LEFT VENTRICULAR ASSIST DEVICE;  Surgeon: Ivin Poot,  MD;  Location: [place];  Service: Open Heart Surgery;  Laterality: N/A;   RIGHT/LEFT HEART CATH AND CORONARY ANGIOGRAPHY N/A 11/05/2019  Procedure: RIGHT/LEFT HEART CATH AND CORONARY ANGIOGRAPHY;  Surgeon: Nelva Bush, MD;  Location: [place] [facility];  Service: Cardiovascular;  Laterality: N/A;   SPINE SURGERY     TEE WITHOUT CARDIOVERSION N/A 11/11/2019   Procedure: TRANSESOPHAGEAL ECHOCARDIOGRAM (TEE);  Surgeon: Prescott Gum, Collier [place], MD;  Location: [place];  Service: Open Heart Surgery;  Laterality: N/A;   TEE WITHOUT CARDIOVERSION N/A 11/15/2019   Procedure: TRANSESOPHAGEAL ECHOCARDIOGRAM (TEE);  Surgeon: Prescott Gum, Collier Salina, MD;  Location: [place];  Service: Open Heart Surgery;  Laterality: N/A;   TONSILLECTOMY      Family History  Problem Relation Age of Onset   Heart attack Mother    Coronary artery disease Mother    Heart disease Mother        Carotid Stenosis and BPG and Heart Disease before age 36   Diabetes Mother    Hypertension Mother    Heart attack Father    Heart disease Father        BPG and Heart Disease before age 58   Hypertension Father    Cancer Father 57       throat   Stroke Father    Colon cancer Neg Hx    Colon polyps Neg Hx    Esophageal cancer Neg Hx    Rectal cancer Neg Hx    Stomach cancer Neg Hx     Allergies  Allergen Reactions   Brilinta [Ticagrelor] Shortness Of Breath   Chlorhexidine Gluconate Other (See Comments)    Skin burning for hours afterward   Statins Other (See Comments)    Failed Crestor 5 mg twice weekly, Crestor 20 mg daily, Pravastatin 40 mg qd, Lipitor, Zocor - muscle aches   Zetia [Ezetimibe] Other (See Comments)    Muscle aches    CBC Latest Ref Rng & Units 03/02/2020 12/04/2019 11/21/2019  WBC 4.0 - 10.5 K/uL 4.1 5.3 10.2  Hemoglobin 13.0 - 17.0 g/dL 14.0 11.9(L) 10.8(L)  Hematocrit 39.0 - 52.0 % 44.6 35.3(L) 34.6(L)  Platelets 150 - 400 K/uL 192 326 495(H)      CMP     Component Value Date/Time   NA 141 06/29/2020 0906    K 5.4 (H) 06/29/2020 0906   CL 100 06/29/2020 0906   CO2 25 06/29/2020 0906   GLUCOSE 102 (H) 06/29/2020 0906   GLUCOSE 109 (H) 03/21/2020 0855   BUN 42 (H) 06/29/2020 0906   CREATININE 1.50 (H) 06/29/2020 0906   CREATININE 1.12 07/23/2015 1016   CALCIUM 9.4 06/29/2020 0906   PROT 7.3 07/24/2020 0856   ALBUMIN 4.5 07/24/2020 0856   AST 40 07/24/2020 0856   ALT 49 (H) 07/24/2020 0856   ALKPHOS 86 07/24/2020 0856   BILITOT 0.4 07/24/2020 0856   GFRNONAA 73 04/22/2020 1013   GFRNONAA 51 (L) 03/21/2020 0855   GFRAA 85 04/22/2020 1013     VAS [place] ABI WITH/WO TBI  Result Date: 07/28/2020  LOWER EXTREMITY DOPPLER STUDY Patient Name:  [name]  Date of Exam:   07/21/2020 Medical Rec #: [id number]     Accession #:    [id number] Date of Birth: 1950/03/16    Patient Gender: M Patient Age:   69Y Exam Location:  [facility] Procedure:      VAS [place] ABI WITH/WO TBI Referring Phys: [id number] Lucretia Roers Jerol Rufener --------------------------------------------------------------------------------  Indications: Rest pain, peripheral artery disease, and No complaints at this              time  06/04/2018 Left SFA to TP trunk thrombectomy, and stent.  Vascular Interventions: 03/07/2018 PTA left distal SFA and popliteal artery. Stent                         in the left popliteal artery. Comparison Study: 01/18/2019 Performing Technologist: Charlane Ferretti RT (R)(VS)  Examination Guidelines: A complete evaluation includes at minimum, Doppler waveform signals and systolic blood pressure reading at the level of bilateral brachial, anterior tibial, and posterior tibial arteries, when vessel segments are accessible. Bilateral testing is considered an integral part of a complete examination. Photoelectric Plethysmograph (PPG) waveforms and toe systolic pressure readings are included as required and additional duplex testing as needed. Limited examinations for reoccurring indications may be performed as  noted.  ABI Findings: +---------+------------------+-----+---------+--------+ Right    Rt Pressure (mmHg)IndexWaveform Comment  +---------+------------------+-----+---------+--------+ Brachial 132                                      +---------+------------------+-----+---------+--------+ ATA      160               1.15 triphasic         +---------+------------------+-----+---------+--------+ PTA      141               1.01 triphasic         +---------+------------------+-----+---------+--------+ Great Toe78                0.56 Abnormal          +---------+------------------+-----+---------+--------+ +---------+------------------+-----+---------+-------+ Left     Lt Pressure (mmHg)IndexWaveform Comment +---------+------------------+-----+---------+-------+ Brachial 139                                     +---------+------------------+-----+---------+-------+ ATA      126               0.91 triphasic        +---------+------------------+-----+---------+-------+ PTA      151               1.09 triphasic        +---------+------------------+-----+---------+-------+ Great Toe92                0.66 Abnormal         +---------+------------------+-----+---------+-------+ +-------+-----------+-----------+------------+------------+ ABI/TBIToday's ABIToday's TBIPrevious ABIPrevious TBI +-------+-----------+-----------+------------+------------+ Right  1.15       .56        1.0         .53          +-------+-----------+-----------+------------+------------+ Left   1.09       .66        .96         .65          +-------+-----------+-----------+------------+------------+ Bilateral ABIs appear essentially unchanged compared to prior study on 01/18/2019. Bilateral TBIs appear essentially unchanged compared to prior study on 01/18/2019.  Summary: Right: Resting right ankle-brachial index is within normal range. No evidence of significant right lower  extremity arterial disease. The right toe-brachial index is abnormal. Left: Resting left ankle-brachial index is within normal range. No evidence of significant left lower extremity arterial disease. The left toe-brachial index is abnormal.  *See table(s) above for measurements and observations.  Electronically signed by [name] MD on 07/28/2020 at 12:42:22 PM.    Final  Assessment & Plan:   1. Atherosclerosis of artery of extremity with rest pain ([place]) Recommend:  The patient has evidence of severe atherosclerotic changes of both lower extremities with rest pain that is associated with preulcerative changes and impending tissue loss of the foot.  This represents a limb threatening ischemia and places the patient at the risk for limb loss.  Patient should undergo angiography of the lower extremities with the hope for intervention for limb salvage.  The risks and benefits as well as the alternative therapies was discussed in detail with the patient.  All questions were answered.  Patient agrees to proceed with angiography.  The patient will follow up with me in the office after the procedure.       2. Essential hypertension Continue antihypertensive medications as already ordered, these medications have been reviewed and there are no changes at this time.   3. Hyperlipidemia LDL goal <70 Continue statin as ordered and reviewed, no changes at this time    Current Outpatient Medications on File Prior to Visit  Medication Sig Dispense Refill   Alirocumab (PRALUENT) 150 MG/ML SOAJ Inject 1 pen into the skin every 14 (fourteen) days. 2 mL 2   apixaban (ELIQUIS) 5 MG TABS tablet Take 1 tablet (5 mg total) by mouth 2 (two) times daily. 180 tablet 1   carvedilol (COREG) 12.5 MG tablet Take 1 tablet (12.5 mg total) by mouth 2 (two) times daily. 180 tablet 3   colchicine 0.6 MG tablet Take 1 tablet (0.6 mg total) by mouth daily as needed (Gout). 30 tablet 0   nitroGLYCERIN (NITROSTAT) 0.4  MG SL tablet Place 1 tablet (0.4 mg total) under the tongue every 5 (five) minutes as needed for chest pain. 25 tablet 2   pantoprazole (PROTONIX) 40 MG tablet TAKE 1 TABLET (40 MG TOTAL) BY MOUTH DAILY. **PLEASE SCHEDULE FOLLOW UP APPT** 30 tablet 0   sacubitril-valsartan (ENTRESTO) 49-51 MG Take 1 tablet by mouth 2 (two) times daily. 180 tablet 3   traMADol (ULTRAM) 50 MG tablet Take 1 tablet (50 mg total) by mouth every 6 (six) hours as needed. 40 tablet 0   traZODone (DESYREL) 50 MG tablet TAKE 1 TABLET BY MOUTH AT BEDTIME AS NEEDED FOR SLEEP. 90 tablet 1   triamcinolone cream (KENALOG) 0.1 % Apply 1 application topically 2 (two) times daily as needed for itching.     torsemide (DEMADEX) 10 MG tablet Take 1 tablet (10 mg total) by mouth daily. (Patient not taking: No sig reported) 30 tablet 5   No current facility-administered medications on file prior to visit.    There are no Patient Instructions on file for this visit. No follow-ups on file.   Kris Hartmann, NP

## 2020-09-17 ENCOUNTER — Other Ambulatory Visit (INDEPENDENT_AMBULATORY_CARE_PROVIDER_SITE_OTHER): Payer: Self-pay | Admitting: Nurse Practitioner

## 2020-09-17 ENCOUNTER — Other Ambulatory Visit: Payer: Self-pay

## 2020-09-17 ENCOUNTER — Encounter
Admission: RE | Disposition: A | Discharge status: Home / Self Care | Payer: Self-pay | Source: Home / Self Care | Attending: Vascular Surgery

## 2020-09-17 ENCOUNTER — Ambulatory Visit
Admission: RE | Admit: 2020-09-17 | Discharge: 2020-09-17 | Disposition: A | Discharge status: Home / Self Care | Payer: PPO | Attending: Vascular Surgery | Admitting: Vascular Surgery

## 2020-09-17 DIAGNOSIS — Z79899 Other long term (current) drug therapy: Secondary | ICD-10-CM | POA: Diagnosis not present

## 2020-09-17 DIAGNOSIS — Z888 Allergy status to other drugs, medicaments and biological substances status: Secondary | ICD-10-CM | POA: Insufficient documentation

## 2020-09-17 DIAGNOSIS — I70222 Atherosclerosis of native arteries of extremities with rest pain, left leg: Secondary | ICD-10-CM | POA: Insufficient documentation

## 2020-09-17 DIAGNOSIS — Z7901 Long term (current) use of anticoagulants: Secondary | ICD-10-CM | POA: Diagnosis not present

## 2020-09-17 DIAGNOSIS — F17290 Nicotine dependence, other tobacco product, uncomplicated: Secondary | ICD-10-CM | POA: Diagnosis not present

## 2020-09-17 DIAGNOSIS — I10 Essential (primary) hypertension: Secondary | ICD-10-CM | POA: Insufficient documentation

## 2020-09-17 DIAGNOSIS — I70229 Atherosclerosis of native arteries of extremities with rest pain, unspecified extremity: Secondary | ICD-10-CM

## 2020-09-17 DIAGNOSIS — E785 Hyperlipidemia, unspecified: Secondary | ICD-10-CM | POA: Insufficient documentation

## 2020-09-17 DIAGNOSIS — I739 Peripheral vascular disease, unspecified: Secondary | ICD-10-CM | POA: Diagnosis not present

## 2020-09-17 HISTORY — PX: LOWER EXTREMITY ANGIOGRAPHY: CATH118251

## 2020-09-17 LAB — CREATININE, SERUM
Creatinine, Ser: 0.98 mg/dL (ref 0.61–1.24)
GFR, Estimated: >60 mL/min (ref >60)

## 2020-09-17 LAB — BUN: BUN: 34 mg/dL — ABNORMAL HIGH (ref 8–23)

## 2020-09-17 SURGERY — LOWER EXTREMITY ANGIOGRAPHY
Anesthesia: Moderate Sedation | Site: Leg Lower | Laterality: Left

## 2020-09-17 MED ORDER — MIDAZOLAM HCL 2 MG/2ML IJ SOLN
INTRAMUSCULAR | Status: DC | PRN
  Administered 2020-09-17 (×2): 2 mg via INTRAVENOUS
  Administered 2020-09-17: 1 mg via INTRAVENOUS

## 2020-09-17 MED ORDER — SODIUM CHLORIDE 0.9% FLUSH: 3.0000 mL | Freq: Two times a day (BID) | INTRAVENOUS | Status: DC

## 2020-09-17 MED ORDER — DIPHENHYDRAMINE HCL 50 MG/ML IJ SOLN: 50.0000 mg | Freq: Once | INTRAMUSCULAR | Status: AC | PRN

## 2020-09-17 MED ORDER — SODIUM CHLORIDE 0.9 % IV SOLN: INTRAVENOUS | Status: DC

## 2020-09-17 MED ORDER — FAMOTIDINE 20 MG PO TABS: 40.0000 mg | ORAL_TABLET | Freq: Once | ORAL | Status: DC | PRN

## 2020-09-17 MED ORDER — ASPIRIN EC 81 MG PO TBEC
81.0000 mg | DELAYED_RELEASE_TABLET | Freq: Every day | ORAL | Status: DC
  Administered 2020-09-17: 81 mg via ORAL

## 2020-09-17 MED ORDER — SODIUM CHLORIDE 0.9% FLUSH: 3.0000 mL | INTRAVENOUS | Status: DC | PRN

## 2020-09-17 MED ORDER — ONDANSETRON HCL 4 MG/2ML IJ SOLN: 4.0000 mg | Freq: Four times a day (QID) | INTRAMUSCULAR | Status: DC | PRN

## 2020-09-17 MED ORDER — CEFAZOLIN SODIUM-DEXTROSE 2-4 GM/100ML-% IV SOLN: 2.0000 g | Freq: Once | INTRAVENOUS | Status: AC

## 2020-09-17 MED ORDER — MIDAZOLAM HCL 2 MG/ML PO SYRP: 8.0000 mg | ORAL_SOLUTION | Freq: Once | ORAL | Status: DC | PRN

## 2020-09-17 MED ORDER — SODIUM CHLORIDE 0.9 % IV SOLN: 250.0000 mL | INTRAVENOUS | Status: DC | PRN

## 2020-09-17 MED ORDER — ACETAMINOPHEN 325 MG PO TABS: 650.0000 mg | ORAL_TABLET | ORAL | Status: DC | PRN

## 2020-09-17 MED ORDER — ASPIRIN EC 81 MG PO TBEC: 81.0000 mg | DELAYED_RELEASE_TABLET | Freq: Every day | ORAL | 2 refills | Status: AC

## 2020-09-17 MED ORDER — NITROGLYCERIN 1 MG/10 ML FOR IR/CATH LAB
INTRA_ARTERIAL | Status: DC | PRN
  Administered 2020-09-17: 300 ug via INTRA_ARTERIAL

## 2020-09-17 MED ORDER — FENTANYL CITRATE (PF) 100 MCG/2ML IJ SOLN
INTRAMUSCULAR | Status: AC
  Filled 2020-09-17: qty 2

## 2020-09-17 MED ORDER — CEFAZOLIN SODIUM-DEXTROSE 2-4 GM/100ML-% IV SOLN
INTRAVENOUS | Status: AC
  Administered 2020-09-17: 2 g via INTRAVENOUS
  Filled 2020-09-17: qty 100

## 2020-09-17 MED ORDER — DIPHENHYDRAMINE HCL 50 MG/ML IJ SOLN
INTRAMUSCULAR | Status: AC
  Administered 2020-09-17: 50 mg via INTRAVENOUS
  Filled 2020-09-17: qty 1

## 2020-09-17 MED ORDER — METHYLPREDNISOLONE SODIUM SUCC 125 MG IJ SOLR: 125.0000 mg | Freq: Once | INTRAMUSCULAR | Status: AC | PRN

## 2020-09-17 MED ORDER — MIDAZOLAM HCL 2 MG/2ML IJ SOLN
INTRAMUSCULAR | Status: AC
  Filled 2020-09-17: qty 4

## 2020-09-17 MED ORDER — LABETALOL HCL 5 MG/ML IV SOLN
10.0000 mg | INTRAVENOUS | Status: DC | PRN
Start: 2020-09-17 — End: 2020-09-17

## 2020-09-17 MED ORDER — IODIXANOL 320 MG/ML IV SOLN
INTRAVENOUS | Status: DC | PRN
  Administered 2020-09-17: 50 mL

## 2020-09-17 MED ORDER — METHYLPREDNISOLONE SODIUM SUCC 125 MG IJ SOLR
INTRAMUSCULAR | Status: AC
  Administered 2020-09-17: 125 mg via INTRAVENOUS
  Filled 2020-09-17: qty 2

## 2020-09-17 MED ORDER — MIDAZOLAM HCL 2 MG/2ML IJ SOLN
INTRAMUSCULAR | Status: AC
  Filled 2020-09-17: qty 2

## 2020-09-17 MED ORDER — FENTANYL CITRATE (PF) 100 MCG/2ML IJ SOLN
INTRAMUSCULAR | Status: DC | PRN
  Administered 2020-09-17 (×3): 50 ug via INTRAVENOUS

## 2020-09-17 MED ORDER — HEPARIN SODIUM (PORCINE) 1000 UNIT/ML IJ SOLN
INTRAMUSCULAR | Status: DC | PRN
  Administered 2020-09-17: 5000 [IU] via INTRAVENOUS

## 2020-09-17 MED ORDER — HYDRALAZINE HCL 20 MG/ML IJ SOLN: 5.0000 mg | INTRAMUSCULAR | Status: DC | PRN

## 2020-09-17 MED ORDER — ASPIRIN 81 MG PO CHEW
CHEWABLE_TABLET | ORAL | Status: AC
  Filled 2020-09-17: qty 1

## 2020-09-17 MED ORDER — ASPIRIN EC 81 MG PO TBEC
DELAYED_RELEASE_TABLET | ORAL | Status: AC
  Filled 2020-09-17: qty 1

## 2020-09-17 MED ORDER — HEPARIN SODIUM (PORCINE) 1000 UNIT/ML IJ SOLN
INTRAMUSCULAR | Status: AC
  Filled 2020-09-17: qty 1

## 2020-09-17 MED ORDER — HYDROMORPHONE HCL 1 MG/ML IJ SOLN: 1.0000 mg | Freq: Once | INTRAMUSCULAR | Status: DC | PRN

## 2020-09-17 SURGICAL SUPPLY — 24 items
BALLN LUTONIX 018 4X100X130 (BALLOONS) ×2
BALLN LUTONIX 018 5X220X130 (BALLOONS) ×2
BALLN LUTONIX 018 5X80X130 (BALLOONS) ×2
BALLN ULTRVRSE 3X80X150 (BALLOONS) ×2
BALLN ULTRVRSE 3X80X150 OTW (BALLOONS) ×1
BALLOON LUTONIX 018 4X100X130 (BALLOONS) IMPLANT
BALLOON LUTONIX 018 5X220X130 (BALLOONS) IMPLANT
BALLOON LUTONIX 018 5X80X130 (BALLOONS) IMPLANT
BALLOON ULTRVRSE 3X80X150 OTW (BALLOONS) IMPLANT
CANNULA 5F STIFF (CANNULA) ×1 IMPLANT
CATH ANGIO 5F PIGTAIL 65CM (CATHETERS) ×1 IMPLANT
CATH ROTAREX 135 6FR (CATHETERS) ×1 IMPLANT
CATH VERT 5X100 (CATHETERS) ×1 IMPLANT
COVER PROBE U/S 5X48 (MISCELLANEOUS) ×1 IMPLANT
DEVICE STARCLOSE SE CLOSURE (Vascular Products) ×1 IMPLANT
GLIDEWIRE ADV .035X260CM (WIRE) ×1 IMPLANT
KIT ENCORE 26 ADVANTAGE (KITS) ×1 IMPLANT
PACK ANGIOGRAPHY (CUSTOM PROCEDURE TRAY) ×2 IMPLANT
SHEATH ANL2 6FRX45 HC (SHEATH) ×1 IMPLANT
SHEATH BRITE TIP 5FRX11 (SHEATH) ×1 IMPLANT
STENT LIFESTENT 5F 6X80X135 (Permanent Stent) ×1 IMPLANT
TUBING CONTRAST HIGH PRESS 72 (TUBING) ×1 IMPLANT
WIRE G V18X300CM (WIRE) ×1 IMPLANT
WIRE GUIDERIGHT .035X150 (WIRE) ×1 IMPLANT

## 2020-09-17 NOTE — OR Nursing (Signed)
Pt reporting itching nose, head and face reddened. Suspect contrast reaction. Solumedrol and benedryl iv given per order

## 2020-09-17 NOTE — Interval H&P Note (Signed)
History and Physical Interval Note:  09/17/2020 9:00 AM  [place]  has presented today for surgery, with the diagnosis of LLE Angio  BARD    ASO w rest pain.  The various methods of treatment have been discussed with the patient and family. After consideration of risks, benefits and other options for treatment, the patient has consented to  Procedure(s): LOWER EXTREMITY ANGIOGRAPHY (Left) as a surgical intervention.  The patient's history has been reviewed, patient examined, no change in status, stable for surgery.  I have reviewed the patient's chart and labs.  Questions were answered to the patient's satisfaction.     [name]

## 2020-09-17 NOTE — Op Note (Signed)
[facility]  Percutaneous Study/Intervention Procedural Note   Date of Surgery: 09/17/2020  Surgeon(s):Milessa Hogan    Assistants:none  Pre-operative Diagnosis: PAD with rest pain, acute on chronic ischemia left lower extremity  Post-operative diagnosis:  Same  Procedure(s) Performed:             1.  Ultrasound guidance for vascular access right femoral artery             2.  Catheter placement into left common femoral artery from right femoral approach             3.  Aortogram and selective left lower extremity angiogram             4.  Mechanical thrombectomy using the [place] Rex device to the left common femoral artery, SFA, and popliteal arteries             5.  Percutaneous transluminal angioplasty of the left tibioperoneal trunk and distal popliteal artery with 4 mm diameter Lutonix drug-coated angioplasty balloon.  6.  Percutaneous transluminal angioplasty of the left SFA and popliteal arteries with 5 mm diameter Lutonix drug-coated angioplasty balloon  7.  Stent placement to the left SFA and popliteal arteries with 6 mm diameter by 10 cm length life stent             8.  StarClose closure device right femoral artery  EBL: 100 cc  Contrast: 50 cc  Fluoro Time: 9.5 minutes  Moderate Conscious Sedation Time: approximately 53 minutes using 5 mg of Versed and 150 mcg of Fentanyl              Indications:  Patient is a 70 y.o.male with acute on chronic rest pain of the left leg starting about a week ago. The patient has noninvasive study showing significant drop in his left ABI with multiple previous interventions on that leg. The patient is brought in for angiography for further evaluation and potential treatment.  Due to the limb threatening nature of the situation, angiogram was performed for attempted limb salvage. The patient is aware that if the procedure fails, amputation would be expected.  The patient also understands that even with successful  revascularization, amputation may still be required due to the severity of the situation.  Risks and benefits are discussed and informed consent is obtained.   Procedure:  The patient was identified and appropriate procedural time out was performed.  The patient was then placed supine on the table and prepped and draped in the usual sterile fashion. Moderate conscious sedation was administered during a face to face encounter with the patient throughout the procedure with my supervision of the RN administering medicines and monitoring the patient's vital signs, pulse oximetry, telemetry and mental status throughout from the start of the procedure until the patient was taken to the recovery room. Ultrasound was used to evaluate the right common femoral artery.  It was diseased but did have flow.  A digital ultrasound image was acquired.  A Seldinger needle was used to access the right common femoral artery under direct ultrasound guidance and a permanent image was performed.  A 0.035 J wire was advanced without resistance and a 5Fr sheath was placed.  Pigtail catheter was placed into the aorta and an AP aortogram was performed. This demonstrated normal renal arteries and normal aorta and iliac segments without significant stenosis. The previous right iliac stent was widely patent. I then crossed the aortic bifurcation and advanced to the left femoral  head. Selective left lower extremity angiogram was then performed. This demonstrated a dense calcific lesion in the left common femoral artery creating a greater than 70% stenosis.  There is also severe disease in the proximal portion of the profunda femoris artery down to the primary branches with stenosis of greater than 80%.  The proximal portion of the left SFA was patent and it was patent down through the mid to distal segment where there was an occlusion several centimeters above the previously placed stents.  The stents were occluded and there was occlusion down  to the tibial trifurcation with occlusion of the proximal portion of the tibioperoneal trunk slightly below the previously placed stents.  The anterior tibial and the posterior tibial arteries were then continuous distally. It was felt that it was in the patient's best interest to proceed with intervention after these images to avoid a second procedure and a larger amount of contrast and fluoroscopy based off of the findings from the initial angiogram. The patient was systemically heparinized and a 6 [place] Ansell sheath was then placed over the [employer] wire. I then used a Kumpe catheter and the advantage wire to cross the occlusion and confirm intraluminal flow in the posterior tibial artery.  I then switched for a V 18 wire and remove the diagnostic catheter.  Mechanical thrombectomy was then performed with 3 passes with the [place] Rex device with the initial pass actually used in the left common femoral artery as well to try to debulk that plaque somewhat.  It was then passed down to the distal SFA and popliteal arteries within and just below and above the previously placed stents.  A large amount of thrombus was removed with this past, but there remained sluggish flow with occlusion at the top and within and below the previously placed stents.  There is minimal improvement in the common femoral artery but this would need to be addressed with an endarterectomy.  That could address the profunda femoris artery as well.  I then performed angioplasty using a 4 mm diameter by 10 cm length angioplasty balloon in the tibioperoneal trunk and distal popliteal artery.  A 5 mm diameter by 22 cm length Lutonix drug-coated angioplasty balloon was then used to treat the distal SFA and popliteal artery.  Each of these inflations were 8 to 10 atm for 1 minute.  Completion imaging showed a large amount of vasospasm distally and intra-arterial nitroglycerin was given twice.  A 3 mm diameter angioplasty balloon was inflated  in the proximal posterior tibial artery and the tibioperoneal trunk as well and this was taken to 8 atm for 1 minute.  Completion imaging following this showed significantly improved flow the tibial vessels but there remained a stenosis just above the previously placed stents at the level of a large collateral that I wanted to preserve.  I selected a 6 mm diameter by 10 cm length noncovered life stent deployed this in the distal SFA bridging down into the previously placed stents in the most proximal popliteal artery.  This is postdilated with a 6 mm Lutonix drug-coated balloon with excellent angiographic completion result and less than 10% residual stenosis. I elected to terminate the procedure. The sheath was removed and StarClose closure device was deployed in the right femoral artery with excellent hemostatic result.  It should be noted, that just below the StarClose was a dense calcific lesion in the 60 to 70% range in the right common femoral artery extending down to the femoral bifurcation  as well.  This may very well need to be addressed with endarterectomy in the future as well.  The patient was taken to the recovery room in stable condition having tolerated the procedure well.  Findings:               Aortogram:  This demonstrated normal renal arteries and normal aorta and iliac segments without significant stenosis. The previous right iliac stent was widely patent.             Left Lower Extremity: Dense calcific lesion in the left common femoral artery creating a greater than 70% stenosis.  There is also severe disease in the proximal portion of the profunda femoris artery down to the primary branches with stenosis of greater than 80%.  The proximal portion of the left SFA was patent and it was patent down through the mid to distal segment where there was an occlusion several centimeters above the previously placed stents.  The stents were occluded and there was occlusion down to the tibial  trifurcation with occlusion of the proximal portion of the tibioperoneal trunk slightly below the previously placed stents.  The anterior tibial and the posterior tibial arteries were then continuous distally.   Disposition: Patient was taken to the recovery room in stable condition having tolerated the procedure well.  Complications: None  [name] 09/17/2020 11:52 AM   This note was created with Dragon Medical transcription system. Any errors in dictation are purely unintentional.

## 2020-09-18 ENCOUNTER — Encounter: Payer: Self-pay | Admitting: Vascular Surgery

## 2020-09-21 ENCOUNTER — Encounter: Payer: Self-pay | Admitting: Vascular Surgery

## 2020-09-22 ENCOUNTER — Ambulatory Visit (INDEPENDENT_AMBULATORY_CARE_PROVIDER_SITE_OTHER): Payer: PPO

## 2020-09-22 DIAGNOSIS — I428 Other cardiomyopathies: Secondary | ICD-10-CM | POA: Diagnosis not present

## 2020-09-23 ENCOUNTER — Ambulatory Visit (INDEPENDENT_AMBULATORY_CARE_PROVIDER_SITE_OTHER): Payer: PPO

## 2020-09-23 DIAGNOSIS — Z0000 Encounter for general adult medical examination without abnormal findings: Secondary | ICD-10-CM | POA: Diagnosis not present

## 2020-09-23 NOTE — Patient Instructions (Signed)
[name] , Thank you for taking time to come for your Medicare Wellness Visit. I appreciate your ongoing commitment to your health goals. Please review the following plan we discussed and let me know if I can assist you in the future.   Screening recommendations/referrals: Colonoscopy: done 09/13/18. Repeat 08/2023 Recommended yearly ophthalmology/optometry visit for glaucoma screening and checkup Recommended yearly dental visit for hygiene and checkup  Vaccinations: Influenza vaccine: due 10/29/20 Pneumococcal vaccine: done 01/06/17 Tdap vaccine: done 04/18/12 Shingles vaccine: done 01/17/20 & 04/22/20   Covid-19: done 04/09/19 & 04/30/19; please bring a copy of your vaccine record to your next appt for booster information  Advanced directives: Please bring a copy of your health care power of attorney and living will to the office at your convenience.  Conditions/risks identified: Keep up the great work!  Next appointment: Follow up in one year for your annual wellness visit.   Preventive Care 71 Years and Older, Male Preventive care refers to lifestyle choices and visits with your health care provider that can promote health and wellness. What does preventive care include? A yearly physical exam. This is also called an annual well check. Dental exams once or twice a year. Routine eye exams. Ask your health care provider how often you should have your eyes checked. Personal lifestyle choices, including: Daily care of your teeth and gums. Regular physical activity. Eating a healthy diet. Avoiding tobacco and drug use. Limiting alcohol use. Practicing safe sex. Taking low doses of aspirin every day. Taking vitamin and mineral supplements as recommended by your health care provider. What happens during an annual well check? The services and screenings done by your health care provider during your annual well check will depend on your age, overall health, lifestyle risk factors, and family  history of disease. Counseling  Your health care provider may ask you questions about your: Alcohol use. Tobacco use. Drug use. Emotional well-being. Home and relationship well-being. Sexual activity. Eating habits. History of falls. Memory and ability to understand (cognition). Work and work Statistician. Screening  You may have the following tests or measurements: Height, weight, and BMI. Blood pressure. Lipid and cholesterol levels. These may be checked every 5 years, or more frequently if you are over 35 years old. Skin check. Lung cancer screening. You may have this screening every year starting at age 14 if you have a 30-pack-year history of smoking and currently smoke or have quit within the past 15 years. Fecal occult blood test (FOBT) of the stool. You may have this test every year starting at age 48. Flexible sigmoidoscopy or colonoscopy. You may have a sigmoidoscopy every 5 years or a colonoscopy every 10 years starting at age 65. Prostate cancer screening. Recommendations will vary depending on your family history and other risks. Hepatitis C blood test. Hepatitis B blood test. Sexually transmitted disease (STD) testing. Diabetes screening. This is done by checking your blood sugar (glucose) after you have not eaten for a while (fasting). You may have this done every 1-3 years. Abdominal aortic aneurysm (AAA) screening. You may need this if you are a current or former smoker. Osteoporosis. You may be screened starting at age 2 if you are at high risk. Talk with your health care provider about your test results, treatment options, and if necessary, the need for more tests. Vaccines  Your health care provider may recommend certain vaccines, such as: Influenza vaccine. This is recommended every year. Tetanus, diphtheria, and acellular pertussis (Tdap, Td) vaccine. You may need  a Td booster every 10 years. Zoster vaccine. You may need this after age 25. Pneumococcal  13-valent conjugate (PCV13) vaccine. One dose is recommended after age 40. Pneumococcal polysaccharide (PPSV23) vaccine. One dose is recommended after age 69. Talk to your health care provider about which screenings and vaccines you need and how often you need them. This information is not intended to replace advice given to you by your health care provider. Make sure you discuss any questions you have with your health care provider. Document Released: 03/13/2015 Document Revised: 11/04/2015 Document Reviewed: 12/16/2014 Elsevier Interactive Patient Education  2017 [place] Prevention in the Home Falls can cause injuries. They can happen to people of all ages. There are many things you can do to make your home safe and to help prevent falls. What can I do on the outside of my home? Regularly fix the edges of walkways and driveways and fix any cracks. Remove anything that might make you trip as you walk through a door, such as a raised step or threshold. Trim any bushes or trees on the path to your home. Use bright outdoor lighting. Clear any walking paths of anything that might make someone trip, such as rocks or tools. Regularly check to see if handrails are loose or broken. Make sure that both sides of any steps have handrails. Any raised decks and porches should have guardrails on the edges. Have any leaves, snow, or ice cleared regularly. Use sand or salt on walking paths during winter. Clean up any spills in your garage right away. This includes oil or grease spills. What can I do in the bathroom? Use night lights. Install grab bars by the toilet and in the tub and shower. Do not use towel bars as grab bars. Use non-skid mats or decals in the tub or shower. If you need to sit down in the shower, use a plastic, non-slip stool. Keep the floor dry. Clean up any water that spills on the floor as soon as it happens. Remove soap buildup in the tub or shower regularly. Attach  bath mats securely with double-sided non-slip rug tape. Do not have throw rugs and other things on the floor that can make you trip. What can I do in the bedroom? Use night lights. Make sure that you have a light by your bed that is easy to reach. Do not use any sheets or blankets that are too big for your bed. They should not hang down onto the floor. Have a firm chair that has side arms. You can use this for support while you get dressed. Do not have throw rugs and other things on the floor that can make you trip. What can I do in the kitchen? Clean up any spills right away. Avoid walking on wet floors. Keep items that you use a lot in easy-to-reach places. If you need to reach something above you, use a strong step stool that has a grab bar. Keep electrical cords out of the way. Do not use floor polish or wax that makes floors slippery. If you must use wax, use non-skid floor wax. Do not have throw rugs and other things on the floor that can make you trip. What can I do with my stairs? Do not leave any items on the stairs. Make sure that there are handrails on both sides of the stairs and use them. Fix handrails that are broken or loose. Make sure that handrails are as F[place]er as the stairways. Check  any carpeting to make sure that it is firmly attached to the stairs. Fix any carpet that is loose or worn. Avoid having throw rugs at the top or bottom of the stairs. If you do have throw rugs, attach them to the floor with carpet tape. Make sure that you have a light switch at the top of the stairs and the bottom of the stairs. If you do not have them, ask someone to add them for you. What else can I do to help prevent falls? Wear shoes that: Do not have high heels. Have rubber bottoms. Are comfortable and fit you well. Are closed at the toe. Do not wear sandals. If you use a stepladder: Make sure that it is fully opened. Do not climb a closed stepladder. Make sure that both sides of the  stepladder are locked into place. Ask someone to hold it for you, if possible. Clearly mark and make sure that you can see: Any grab bars or handrails. First and last steps. Where the edge of each step is. Use tools that help you move around (mobility aids) if they are needed. These include: Canes. Walkers. Scooters. Crutches. Turn on the lights when you go into a dark area. Replace any light bulbs as soon as they burn out. Set up your furniture so you have a clear path. Avoid moving your furniture around. If any of your floors are uneven, fix them. If there are any pets around you, be aware of where they are. Review your medicines with your doctor. Some medicines can make you feel dizzy. This can increase your chance of falling. Ask your doctor what other things that you can do to help prevent falls. This information is not intended to replace advice given to you by your health care provider. Make sure you discuss any questions you have with your health care provider. Document Released: 12/11/2008 Document Revised: 07/23/2015 Document Reviewed: 03/21/2014 Elsevier Interactive Patient Education  2017 [employer].

## 2020-09-23 NOTE — Progress Notes (Signed)
Subjective:   [name] is a 70 y.o. male who presents for Medicare Annual/Subsequent preventive examination.  Virtual Visit via Telephone Note  I connected with  [name] on 09/23/20 at  8:00 AM EDT by telephone and verified that I am speaking with the correct person using two identifiers.  Location: Patient: home Provider: [facility] Persons participating in the virtual visit: [place]   I discussed the limitations, risks, security and privacy concerns of performing an evaluation and management service by telephone and the availability of in person appointments. The patient expressed understanding and agreed to proceed.  Interactive audio and video telecommunications were attempted between this nurse and patient, however failed, due to patient having technical difficulties OR patient did not have access to video capability.  We continued and completed visit with audio only.  Some vital signs may be absent or patient reported.   [name], LPN   Review of Systems     Cardiac Risk Factors include: advanced age (>1mn, >>74women);hypertension;male gender;smoking/ tobacco exposure;dyslipidemia     Objective:    There were no vitals filed for this visit. There is no height or weight on file to calculate BMI.  Advanced Directives 09/23/2020 09/17/2020 03/23/2020 11/05/2019 11/05/2019 09/23/2019 02/11/2019  Does Patient Have a Medical Advance Directive? Yes Yes Yes Yes Yes Yes Yes  Type of AParamedicof AAlvaradoLiving will HVandervoortLiving will - Living will Living will HLivengoodLiving will HMansonLiving will  Does patient want to make changes to medical advance directive? - - No - Patient declined No - Patient declined No - Patient declined - No - Patient declined  Copy of [place] Chart? No - copy requested - - - - No - copy requested No - copy requested   Would patient like information on creating a medical advance directive? - - - - - - -    Current Medications (verified) Outpatient Encounter Medications as of 09/23/2020  Medication Sig   Alirocumab (PRALUENT) 150 MG/ML SOAJ Inject 1 pen into the skin every 14 (fourteen) days.   apixaban (ELIQUIS) 5 MG TABS tablet Take 1 tablet (5 mg total) by mouth 2 (two) times daily.   aspirin EC 81 MG tablet Take 1 tablet (81 mg total) by mouth daily.   carvedilol (COREG) 12.5 MG tablet Take 1 tablet (12.5 mg total) by mouth 2 (two) times daily.   colchicine 0.6 MG tablet Take 1 tablet (0.6 mg total) by mouth daily as needed (Gout).   nitroGLYCERIN (NITROSTAT) 0.4 MG SL tablet Place 1 tablet (0.4 mg total) under the tongue every 5 (five) minutes as needed for chest pain.   sacubitril-valsartan (ENTRESTO) 49-51 MG Take 1 tablet by mouth 2 (two) times daily.   traMADol (ULTRAM) 50 MG tablet Take 1 tablet (50 mg total) by mouth every 6 (six) hours as needed.   traZODone (DESYREL) 50 MG tablet TAKE 1 TABLET BY MOUTH AT BEDTIME AS NEEDED FOR SLEEP.   [DISCONTINUED] pantoprazole (PROTONIX) 40 MG tablet TAKE 1 TABLET (40 MG TOTAL) BY MOUTH DAILY. **PLEASE SCHEDULE FOLLOW UP APPT** (Patient not taking: Reported on 09/17/2020)   [DISCONTINUED] torsemide (DEMADEX) 10 MG tablet Take 1 tablet (10 mg total) by mouth daily. (Patient not taking: No sig reported)   [DISCONTINUED] triamcinolone cream (KENALOG) 0.1 % Apply 1 application topically 2 (two) times daily as needed for itching.   No facility-administered encounter medications on file  as of 09/23/2020.    Allergies (verified) Brilinta [ticagrelor], Chlorhexidine gluconate, Contrast media [iodinated diagnostic agents], Statins, and Zetia [ezetimibe]   History: Past Medical History:  Diagnosis Date   Abnormal nuclear cardiac imaging test 08/08/2015   Arthritis    fingers   Carotid artery occlusion    CHF (congestive heart failure) (HCC)    Coronary  atherosclerosis of native coronary artery 01/29/2013   11/05/19 R/LHC 80% dLMCA stenosis small diffusely dz dLAD, chronically occluded OM1 50%mRCA lesion, widely patent mLCx strent, moderately elevated L heart filling pressures, mild to moderate RH filling pressures, normal to moderately reduced CO   Duodenal erosion    Encounter for screening for lung cancer 07/13/2016   Esophageal stenosis    esophageal dilation   GERD (gastroesophageal reflux disease)    H. pylori infection    Heart attack ([place]) Oct. 2009   Mild   Hiatal hernia    Hyperlipidemia    Hypertension    Old myocardial infarction 11/29/2007   Mildly elevated troponin, isolated value in October 2009. Cardiac catheterization-nonobstructive 60% RCA disease-subsequent nuclear stress test-9 minutes, low risk, mild inferior wall hypokinesis    Pain in limb 12/19/2017   Peripheral vascular disease ([place])    Unstable angina ([place]) 11/25/2017   Past Surgical History:  Procedure Laterality Date   APPENDECTOMY     BACK SURGERY     BIV ICD INSERTION CRT-D N/A 03/23/2020   Procedure: BIV ICD INSERTION CRT-D;  Surgeon: Vickie Epley, MD;  Location: [place] [facility];  Service: Cardiovascular;  Laterality: N/A;   CARDIAC CATHETERIZATION N/A 08/07/2015   Procedure: Left Heart Cath and Coronary Angiography;  Surgeon: [name], MD;  Location: [place] [facility];  Service: Cardiovascular;  Laterality: N/A;   CARDIAC CATHETERIZATION N/A 08/07/2015   Procedure: Coronary Stent Intervention;  Surgeon: [name], MD;  Location: [place] [facility];  Service: Cardiovascular;  Laterality: N/A;   CARDIAC CATHETERIZATION N/A 08/07/2015   Procedure: Coronary Stent Intervention;  Surgeon: [name], MD;  Location: [place] [facility];  Service: Cardiovascular;  Laterality: N/A;   CAROTID ENDARTERECTOMY  01/05/2006   Right  CEA with DPA   CATARACT EXTRACTION W/ INTRAOCULAR LENS IMPLANT Left 12/04/2017   CATARACT EXTRACTION W/PHACO Left 12/04/2017    Procedure: CATARACT EXTRACTION PHACO AND INTRAOCULAR LENS PLACEMENT ([name]) LEFT;  Surgeon: Eulogio Bear, MD;  Location: [place];  Service: Ophthalmology;  Laterality: Left;   CATARACT EXTRACTION W/PHACO Right 02/06/2018   Procedure: CATARACT EXTRACTION PHACO AND INTRAOCULAR LENS PLACEMENT (IOC)RIGHT;  Surgeon: Eulogio Bear, MD;  Location: [place];  Service: Ophthalmology;  Laterality: Right;   COLONOSCOPY  05/20/2008   COLONOSCOPY WITH PROPOFOL N/A 09/13/2018   Procedure: COLONOSCOPY WITH BIOPSY;  Surgeon: [name], MD;  Location: [place];  Service: Endoscopy;  Laterality: N/A;   CORONARY ARTERY BYPASS GRAFT N/A 11/11/2019   Procedure: CORONARY ARTERY BYPASS GRAFTING (CABG) USING LIMA to Diag1; ENDOSCOPICALLY HARVESTED RIGHT GREATER SAPHENOUS VEIN: SVG to OM1; SVG to OM2; SVG to PDA.;  Surgeon: Ivin Poot, MD;  Location: [place];  Service: Open Heart Surgery;  Laterality: N/A;   CORONARY STENT PLACEMENT  08/07/2015   MID CIRCUMFLEX   ENDOVEIN HARVEST OF GREATER SAPHENOUS VEIN Right 11/11/2019   Procedure: ENDOVEIN HARVEST OF GREATER SAPHENOUS VEIN;  Surgeon: Ivin Poot, MD;  Location: [place];  Service: Open Heart Surgery;  Laterality: Right;   ESOPHAGOGASTRODUODENOSCOPY (EGD) WITH PROPOFOL N/A 02/11/2019  Procedure: ESOPHAGOGASTRODUODENOSCOPY (EGD) WITH BIOPSY and  Dilation;  Surgeon: [name], MD;  Location: [place];  Service: Endoscopy;  Laterality: N/A;   HIP SURGERY Left 10/2016   left hip tendon repair   LEFT HEART CATH AND CORONARY ANGIOGRAPHY N/A 11/27/2017   Procedure: LEFT HEART CATH AND CORONARY ANGIOGRAPHY;  Surgeon: Wellington Hampshire, MD;  Location: [place] [facility];  Service: Cardiovascular;  Laterality: N/A;   LOWER EXTREMITY ANGIOGRAPHY Left 02/12/2018   Procedure: LOWER EXTREMITY ANGIOGRAPHY;  Surgeon: Algernon Huxley, MD;  Location: [place] [facility];  Service: Cardiovascular;  Laterality: Left;    LOWER EXTREMITY ANGIOGRAPHY Left 03/07/2018   Procedure: LOWER EXTREMITY ANGIOGRAPHY;  Surgeon: Algernon Huxley, MD;  Location: [place] [facility];  Service: Cardiovascular;  Laterality: Left;   LOWER EXTREMITY ANGIOGRAPHY Left 06/04/2018   Procedure: LOWER EXTREMITY ANGIOGRAPHY;  Surgeon: Algernon Huxley, MD;  Location: [place] [facility];  Service: Cardiovascular;  Laterality: Left;   LOWER EXTREMITY ANGIOGRAPHY Left 09/17/2020   Procedure: LOWER EXTREMITY ANGIOGRAPHY;  Surgeon: Algernon Huxley, MD;  Location: [place] [facility];  Service: Cardiovascular;  Laterality: Left;   PLACEMENT OF IMPELLA LEFT VENTRICULAR ASSIST DEVICE N/A 11/11/2019   Procedure: PLACEMENT OF IMPELLA LEFT VENTRICULAR ASSIST DEVICE 5.5;  Surgeon: Ivin Poot, MD;  Location: [place];  Service: Open Heart Surgery;  Laterality: N/A;  Midline Sternotomy   POLYPECTOMY N/A 09/13/2018   Procedure: POLYPECTOMY;  Surgeon: [name], MD;  Location: [place];  Service: Endoscopy;  Laterality: N/A;   POLYPECTOMY N/A 02/11/2019   Procedure: POLYPECTOMY;  Surgeon: [name], MD;  Location: [place];  Service: Endoscopy;  Laterality: N/A;   REMOVAL OF IMPELLA LEFT VENTRICULAR ASSIST DEVICE N/A 11/15/2019   Procedure: REMOVAL OF IMPELLA 5.5 LEFT VENTRICULAR ASSIST DEVICE;  Surgeon: Ivin Poot, MD;  Location: [place];  Service: Open Heart Surgery;  Laterality: N/A;   RIGHT/LEFT HEART CATH AND CORONARY ANGIOGRAPHY N/A 11/05/2019   Procedure: RIGHT/LEFT HEART CATH AND CORONARY ANGIOGRAPHY;  Surgeon: Nelva Bush, MD;  Location: [place] [facility];  Service: Cardiovascular;  Laterality: N/A;   SPINE SURGERY     TEE WITHOUT CARDIOVERSION N/A 11/11/2019   Procedure: TRANSESOPHAGEAL ECHOCARDIOGRAM (TEE);  Surgeon: Prescott Gum, Collier Salina, MD;  Location: [place];  Service: Open Heart Surgery;  Laterality: N/A;   TEE WITHOUT CARDIOVERSION N/A 11/15/2019   Procedure: TRANSESOPHAGEAL ECHOCARDIOGRAM (TEE);  Surgeon: Prescott Gum,  Collier Salina, MD;  Location: [place];  Service: Open Heart Surgery;  Laterality: N/A;   TONSILLECTOMY     Family History  Problem Relation Age of Onset   Heart attack Mother    Coronary artery disease Mother    Heart disease Mother        Carotid Stenosis and BPG and Heart Disease before age 38   Diabetes Mother    Hypertension Mother    Heart attack Father    Heart disease Father        BPG and Heart Disease before age 41   Hypertension Father    Cancer Father 61       throat   Stroke Father    Colon cancer Neg Hx    Colon polyps Neg Hx    Esophageal cancer Neg Hx    Rectal cancer Neg Hx    Stomach cancer Neg Hx    Social History   Socioeconomic History   Marital status: Married    Spouse name: Not on file  Number of children: 1   Years of education: Not on file   Highest education level: Bachelor's degree (e.g., BA, AB, BS)  Occupational History   Not on file  Tobacco Use   Smoking status: Former    Packs/day: 1.25    Years: 35.00    Pack years: 43.75    Types: Cigarettes    Quit date: 02/28/2005    Years since quitting: 15.5   Smokeless tobacco: Current    Types: Snuff   Tobacco comments:    occaisionally  Vaping Use   Vaping Use: Never used  Substance and Sexual Activity   Alcohol use: Yes    Alcohol/week: 8.0 - 10.0 standard drinks    Types: 8 - 10 Glasses of wine per week    Comment: weekly   Drug use: No   Sexual activity: Yes  Other Topics Concern   Not on file  Social History Narrative   Not on file   Social Determinants of Health   Financial Resource Strain: Low Risk    Difficulty of Paying Living Expenses: Not hard at all  Food Insecurity: No Food Insecurity   Worried About Charity fundraiser in the Last Year: Never true   Ran Out of Food in the Last Year: Never true  Transportation Needs: No Transportation Needs   Lack of Transportation (Medical): No   Lack of Transportation (Non-Medical): No  Physical Activity: Insufficiently Active   Days of  Exercise per Week: 2 days   Minutes of Exercise per Session: 60 min  Stress: No Stress Concern Present   Feeling of Stress : Not at all  Social Connections: Moderately Integrated   Frequency of Communication with Friends and Family: More than three times a week   Frequency of Social Gatherings with Friends and Family: Three times a week   Attends Religious Services: Never   Active Member of Clubs or Organizations: Yes   Attends Music therapist: More than 4 times per year   Marital Status: Married    Tobacco Counseling Ready to quit: Not Answered Counseling given: Not Answered Tobacco comments: occaisionally   Clinical Intake:  Pre-visit preparation completed: Yes  Pain : No/denies pain     Nutritional Risks: None Diabetes: No  How often do you need to have someone help you when you read instructions, pamphlets, or other written materials from your doctor or pharmacy?: 1 - Never    Interpreter Needed?: No  Information entered by :: [name] LPN   Activities of Daily Living In your present state of health, do you have any difficulty performing the following activities: 09/23/2020 11/05/2019  Hearing? N N  Vision? N N  Difficulty concentrating or making decisions? N N  Walking or climbing stairs? N N  Dressing or bathing? N N  Doing errands, shopping? N N  Preparing Food and eating ? N -  Using the Toilet? N -  In the past six months, have you accidently leaked urine? N -  Do you have problems with loss of bowel control? N -  Managing your Medications? N -  Managing your Finances? N -  Housekeeping or managing your Housekeeping? N -  Some recent data might be hidden    Patient Care Team: Glean Hess, MD as PCP - General (Internal Medicine) Vickie Epley, MD as PCP - Electrophysiology (Cardiology) Eulogio Bear, MD as Consulting Physician (Ophthalmology) Lucky Cowboy Erskine Squibb, MD as Referring Physician (Vascular Surgery) End, [place] Gave,  MD as  Consulting Physician (Cardiology)  Indicate any recent Medical Services you may have received from other than [facility] providers in the past year (date may be approximate).     Assessment:   This is a routine wellness examination for Bhuvan.  Hearing/Vision screen Hearing Screening - Comments:: Pt denies hearing difficulty  Vision Screening - Comments:: Vision screenings with [facility]; due for exam  Dietary issues and exercise activities discussed: Current Exercise Habits: Home exercise routine, Type of exercise: Other - see comments;strength training/weights (golf), Time (Minutes): 60, Frequency (Times/Week): 2, Weekly Exercise (Minutes/Week): 120, Intensity: Moderate, Exercise limited by: cardiac condition(s)   Goals Addressed             This Visit's Progress    DIET - INCREASE WATER INTAKE   On track    Recommend drinking 6-8 glasses of water per day       Depression Screen PHQ 2/9 Scores 09/23/2020 09/23/2019 08/26/2019 05/21/2019 09/12/2018 09/06/2017 05/01/2017  PHQ - 2 Score 0 0 0 0 0 0 0  PHQ- 9 Score - - 0 0 - 0 1    Fall Risk Fall Risk  09/23/2020 09/23/2019 08/26/2019 05/21/2019 09/12/2018  Falls in the past year? 0 0 0 0 0  Number falls in past yr: 0 0 0 0 0  Injury with Fall? 0 0 0 0 0  Risk for fall due to : No Fall Risks No Fall Risks No Fall Risks No Fall Risks -  Follow up Falls prevention discussed Falls prevention discussed Falls evaluation completed Falls evaluation completed Falls prevention discussed    FALL RISK PREVENTION PERTAINING TO THE HOME:  Any stairs in or around the home? Yes  If so, are there any without handrails? No  Home free of loose throw rugs in walkways, pet beds, electrical cords, etc? Yes  Adequate lighting in your home to reduce risk of falls? Yes   ASSISTIVE DEVICES UTILIZED TO PREVENT FALLS:  Life alert? No  Use of a cane, walker or w/c? No  Grab bars in the bathroom? Yes  Shower chair or bench in shower? Yes   Elevated toilet seat or a handicapped toilet? Yes   TIMED UP AND GO:  Was the test performed? No . Telephonic visit.   Cognitive Function: Normal cognitive status assessed by direct observation by this Nurse Health Advisor. No abnormalities found.       6CIT Screen 09/12/2018 09/06/2017 07/13/2016  What Year? 0 points 0 points 0 points  What month? 0 points 0 points 0 points  What time? 0 points 0 points -  Count back from 20 0 points 0 points -  Months in reverse 0 points 0 points -  Repeat phrase 0 points 4 points -  Total Score 0 4 -    Immunizations Immunization History  Administered Date(s) Administered   Fluad Quad(high Dose 65+) 11/23/2018   Influenza, High Dose Seasonal PF 12/23/2016, 12/12/2017   Influenza-Unspecified 12/15/2015, 12/23/2016   PFIZER(Purple Top)SARS-COV-2 Vaccination 04/09/2019, 04/30/2019   Pneumococcal Conjugate-13 06/15/2015   Pneumococcal Polysaccharide-23 01/06/2017   Tdap 04/18/2012   Zoster Recombinat (Shingrix) 01/17/2020, 04/22/2020    TDAP status: Up to date  Flu Vaccine status: Up to date per patient; due 10/29/20  Pneumococcal vaccine status: Up to date  Covid-19 vaccine status: Completed vaccines; pt advised to bring vaccine card to next appt for booster information   Qualifies for Shingles Vaccine? Yes   Zostavax completed No   Shingrix Completed?: Yes  Screening Tests Health Maintenance  Topic Date Due   COVID-19 Vaccine (3 - Pfizer risk series) 05/28/2019   INFLUENZA VACCINE  09/28/2020   TETANUS/TDAP  04/18/2022   COLONOSCOPY (Pts 45-35yr Insurance coverage will need to be confirmed)  09/13/2023   Hepatitis C Screening  Completed   PNA vac Low Risk Adult  Completed   Zoster Vaccines- Shingrix  Completed   HPV VACCINES  Aged Out    Health Maintenance  Health Maintenance Due  Topic Date Due   COVID-19 Vaccine (3 - Pfizer risk series) 05/28/2019    Colorectal cancer screening: Type of screening: Colonoscopy.  Completed 09/13/18. Repeat every 5 years  Lung Cancer Screening: (Low Dose CT Chest recommended if Age 71-80years, 30 pack-year currently smoking OR have quit w/in 15years.) does not qualify.   Additional Screening:  Hepatitis C Screening: does qualify; Completed 05/21/19  Vision Screening: Recommended annual ophthalmology exams for early detection of glaucoma and other disorders of the eye. Is the patient up to date with their annual eye exam?  No  Who is the provider or what is the name of the office in which the patient attends annual eye exams? [facility]   Dental Screening: Recommended annual dental exams for proper oral hygiene  Community Resource Referral / Chronic Care Management: CRR required this visit?  No   CCM required this visit?  No      Plan:     I have personally reviewed and noted the following in the patient's chart:   Medical and social history Use of alcohol, tobacco or illicit drugs  Current medications and supplements including opioid prescriptions. Patient is currently taking opioid prescriptions. Information provided to patient regarding non-opioid alternatives. Patient advised to discuss non-opioid treatment plan with their provider. Functional ability and status Nutritional status Physical activity Advanced directives List of other physicians Hospitalizations, surgeries, and ER visits in previous 12 months Vitals Screenings to include cognitive, depression, and falls Referrals and appointments  In addition, I have reviewed and discussed with patient certain preventive protocols, quality metrics, and best practice recommendations. A written personalized care plan for preventive services as well as general preventive health recommendations were provided to patient.     KClemetine Marker LPN   [phone]  Nurse Notes: pt advised due for CPE with Dr. BArmy Melia msg sent to front desk to contact patient.

## 2020-09-24 ENCOUNTER — Other Ambulatory Visit (INDEPENDENT_AMBULATORY_CARE_PROVIDER_SITE_OTHER): Payer: Self-pay | Admitting: Nurse Practitioner

## 2020-09-24 ENCOUNTER — Encounter (INDEPENDENT_AMBULATORY_CARE_PROVIDER_SITE_OTHER): Payer: Self-pay

## 2020-09-24 DIAGNOSIS — Z95820 Peripheral vascular angioplasty status with implants and grafts: Secondary | ICD-10-CM

## 2020-09-24 DIAGNOSIS — R200 Anesthesia of skin: Secondary | ICD-10-CM

## 2020-09-24 DIAGNOSIS — M79672 Pain in left foot: Secondary | ICD-10-CM

## 2020-09-24 LAB — CUP PACEART REMOTE DEVICE CHECK
Battery Remaining Longevity: 65 mo
Battery Remaining Percentage: 90 %
Battery Voltage: 2.99 V
Brady Statistic AP VP Percent: 45 %
Brady Statistic AP VS Percent: 1 %
Brady Statistic AS VP Percent: 48 %
Brady Statistic AS VS Percent: 2.3 %
Brady Statistic RA Percent Paced: 42 %
Date Time Interrogation Session: 20220727162509
HighPow Impedance: 57 Ohm
Implantable Lead Implant Date: 20220124
Implantable Lead Implant Date: 20220124
Implantable Lead Implant Date: 20220124
Implantable Lead Location: 753858
Implantable Lead Location: 753859
Implantable Lead Location: 753860
Implantable Pulse Generator Implant Date: 20220124
Lead Channel Impedance Value: 490 Ohm
Lead Channel Impedance Value: 490 Ohm
Lead Channel Impedance Value: 500 Ohm
Lead Channel Pacing Threshold Amplitude: 0.75 V
Lead Channel Pacing Threshold Amplitude: 0.75 V
Lead Channel Pacing Threshold Amplitude: 1.375 V
Lead Channel Pacing Threshold Pulse Width: 0.5 ms
Lead Channel Pacing Threshold Pulse Width: 0.5 ms
Lead Channel Pacing Threshold Pulse Width: 0.5 ms
Lead Channel Sensing Intrinsic Amplitude: 12 mV
Lead Channel Sensing Intrinsic Amplitude: 2.2 mV
Lead Channel Setting Pacing Amplitude: 1.75 V
Lead Channel Setting Pacing Amplitude: 2 V
Lead Channel Setting Pacing Amplitude: 2.375
Lead Channel Setting Pacing Pulse Width: 0.5 ms
Lead Channel Setting Pacing Pulse Width: 0.5 ms
Lead Channel Setting Sensing Sensitivity: 0.5 mV
Pulse Gen Serial Number: 810017138

## 2020-09-24 NOTE — Telephone Encounter (Signed)
Called and scheduled patient

## 2020-09-24 NOTE — Telephone Encounter (Signed)
Let's see if we can get mr Mines in today/tomorrow for ABIs

## 2020-09-25 ENCOUNTER — Other Ambulatory Visit: Payer: Self-pay

## 2020-09-25 ENCOUNTER — Other Ambulatory Visit (INDEPENDENT_AMBULATORY_CARE_PROVIDER_SITE_OTHER): Payer: Self-pay | Admitting: Vascular Surgery

## 2020-09-25 ENCOUNTER — Telehealth (INDEPENDENT_AMBULATORY_CARE_PROVIDER_SITE_OTHER): Payer: Self-pay

## 2020-09-25 ENCOUNTER — Ambulatory Visit (INDEPENDENT_AMBULATORY_CARE_PROVIDER_SITE_OTHER): Payer: PPO

## 2020-09-25 ENCOUNTER — Ambulatory Visit (INDEPENDENT_AMBULATORY_CARE_PROVIDER_SITE_OTHER): Payer: PPO | Admitting: Vascular Surgery

## 2020-09-25 VITALS — BP 127/81 | HR 68 | Resp 16 | Ht 67.0 in | Wt 180.0 lb

## 2020-09-25 DIAGNOSIS — M79672 Pain in left foot: Secondary | ICD-10-CM

## 2020-09-25 DIAGNOSIS — R202 Paresthesia of skin: Secondary | ICD-10-CM

## 2020-09-25 DIAGNOSIS — I70229 Atherosclerosis of native arteries of extremities with rest pain, unspecified extremity: Secondary | ICD-10-CM | POA: Diagnosis not present

## 2020-09-25 DIAGNOSIS — E785 Hyperlipidemia, unspecified: Secondary | ICD-10-CM | POA: Diagnosis not present

## 2020-09-25 DIAGNOSIS — Z95820 Peripheral vascular angioplasty status with implants and grafts: Secondary | ICD-10-CM

## 2020-09-25 DIAGNOSIS — I10 Essential (primary) hypertension: Secondary | ICD-10-CM | POA: Diagnosis not present

## 2020-09-25 DIAGNOSIS — R200 Anesthesia of skin: Secondary | ICD-10-CM

## 2020-09-25 NOTE — Progress Notes (Signed)
MRN : [id number]  [name] is a 70 y.o. ([birth year]) male who presents with chief complaint of  Chief Complaint  Patient presents with   Follow-up    ultrasound  .  History of Present Illness: Patient returns today in follow up of his PAD.  Only a little over a week ago he underwent left SFA/popliteal/tibial intervention but over the past couple of days he has developed numbness and pain in his feet again.  He had known severe Green femoral disease bilaterally which was proximal to the intervention on the left.  Noninvasive studies today show reocclusion of his left SFA and popliteal stents.  He does not have ulceration but he does have symptoms worrisome for rest pain on the left.  He gets some pain in his legs with walking on the right.  He is on Eliquis and aspirin but is intolerant to statin agents  Current Outpatient Medications  Medication Sig Dispense Refill   Alirocumab (PRALUENT) 150 MG/ML SOAJ Inject 1 pen into the skin every 14 (fourteen) days. 2 mL 2   apixaban (ELIQUIS) 5 MG TABS tablet Take 1 tablet (5 mg total) by mouth 2 (two) times daily. 180 tablet 1   aspirin EC 81 MG tablet Take 1 tablet (81 mg total) by mouth daily. 150 tablet 2   carvedilol (COREG) 12.5 MG tablet Take 1 tablet (12.5 mg total) by mouth 2 (two) times daily. 180 tablet 3   colchicine 0.6 MG tablet Take 1 tablet (0.6 mg total) by mouth daily as needed (Gout). 30 tablet 0   nitroGLYCERIN (NITROSTAT) 0.4 MG SL tablet Place 1 tablet (0.4 mg total) under the tongue every 5 (five) minutes as needed for chest pain. 25 tablet 2   sacubitril-valsartan (ENTRESTO) 49-51 MG Take 1 tablet by mouth 2 (two) times daily. 180 tablet 3   traMADol (ULTRAM) 50 MG tablet Take 1 tablet (50 mg total) by mouth every 6 (six) hours as needed. 40 tablet 0   traZODone (DESYREL) 50 MG tablet TAKE 1 TABLET BY MOUTH AT BEDTIME AS NEEDED FOR SLEEP. 90 tablet 1   No current facility-administered medications for this visit.     Past Medical History:  Diagnosis Date   Abnormal nuclear cardiac imaging test 08/08/2015   Arthritis    fingers   Carotid artery occlusion    CHF (congestive heart failure) (HCC)    Coronary atherosclerosis of native coronary artery 01/29/2013   11/05/19 R/LHC 80% dLMCA stenosis small diffusely dz dLAD, chronically occluded OM1 50%mRCA lesion, widely patent mLCx strent, moderately elevated L heart filling pressures, mild to moderate RH filling pressures, normal to moderately reduced CO   Duodenal erosion    Encounter for screening for lung cancer 07/13/2016   Esophageal stenosis    esophageal dilation   GERD (gastroesophageal reflux disease)    H. pylori infection    Heart attack ([place]) Oct. 2009   Mild   Hiatal hernia    Hyperlipidemia    Hypertension    Old myocardial infarction 11/29/2007   Mildly elevated troponin, isolated value in October 2009. Cardiac catheterization-nonobstructive 60% RCA disease-subsequent nuclear stress test-9 minutes, low risk, mild inferior wall hypokinesis    Pain in limb 12/19/2017   Peripheral vascular disease ([place])    Unstable angina ([place]) 11/25/2017    Past Surgical History:  Procedure Laterality Date   APPENDECTOMY     BACK SURGERY     BIV ICD INSERTION CRT-D N/A 03/23/2020   Procedure: BIV ICD  INSERTION CRT-D;  Surgeon: Vickie Epley, MD;  Location: [facility];  Service: Cardiovascular;  Laterality: N/A;   CARDIAC CATHETERIZATION N/A 08/07/2015   Procedure: Left Heart Cath and Coronary Angiography;  Surgeon: [name], MD;  Location: [facility];  Service: Cardiovascular;  Laterality: N/A;   CARDIAC CATHETERIZATION N/A 08/07/2015   Procedure: Coronary Stent Intervention;  Surgeon: [name], MD;  Location: [facility];  Service: Cardiovascular;  Laterality: N/A;   CARDIAC CATHETERIZATION N/A 08/07/2015   Procedure: Coronary Stent Intervention;  Surgeon: [name], MD;  Location: [facility];  Service:  Cardiovascular;  Laterality: N/A;   CAROTID ENDARTERECTOMY  01/05/2006   Right  CEA with DPA   CATARACT EXTRACTION W/ INTRAOCULAR LENS IMPLANT Left 12/04/2017   CATARACT EXTRACTION W/PHACO Left 12/04/2017   Procedure: CATARACT EXTRACTION PHACO AND INTRAOCULAR LENS PLACEMENT ([place]) LEFT;  Surgeon: Eulogio Bear, MD;  Location: [facility];  Service: Ophthalmology;  Laterality: Left;   CATARACT EXTRACTION W/PHACO Right 02/06/2018   Procedure: CATARACT EXTRACTION PHACO AND INTRAOCULAR LENS PLACEMENT ([facility])RIGHT;  Surgeon: Eulogio Bear, MD;  Location: [facility];  Service: Ophthalmology;  Laterality: Right;   COLONOSCOPY  05/20/2008   COLONOSCOPY WITH PROPOFOL N/A 09/13/2018   Procedure: COLONOSCOPY WITH BIOPSY;  Surgeon: [name], MD;  Location: [facility];  Service: Endoscopy;  Laterality: N/A;   CORONARY ARTERY BYPASS GRAFT N/A 11/11/2019   Procedure: CORONARY ARTERY BYPASS GRAFTING (CABG) USING LIMA to Diag1; ENDOSCOPICALLY HARVESTED RIGHT GREATER SAPHENOUS VEIN: SVG to OM1; SVG to OM2; SVG to PDA.;  Surgeon: Ivin Poot, MD;  Location: [facility];  Service: Open Heart Surgery;  Laterality: N/A;   CORONARY STENT PLACEMENT  08/07/2015   MID CIRCUMFLEX   ENDOVEIN HARVEST OF GREATER SAPHENOUS VEIN Right 11/11/2019   Procedure: ENDOVEIN HARVEST OF GREATER SAPHENOUS VEIN;  Surgeon: Ivin Poot, MD;  Location: [facility];  Service: Open Heart Surgery;  Laterality: Right;   ESOPHAGOGASTRODUODENOSCOPY (EGD) WITH PROPOFOL N/A 02/11/2019   Procedure: ESOPHAGOGASTRODUODENOSCOPY (EGD) WITH BIOPSY and  Dilation;  Surgeon: [name], MD;  Location: [facility];  Service: Endoscopy;  Laterality: N/A;   HIP SURGERY Left 10/2016   left hip tendon repair   LEFT HEART CATH AND CORONARY ANGIOGRAPHY N/A 11/27/2017   Procedure: LEFT HEART CATH AND CORONARY ANGIOGRAPHY;  Surgeon: Wellington Hampshire, MD;  Location: [facility];  Service: Cardiovascular;  Laterality:  N/A;   LOWER EXTREMITY ANGIOGRAPHY Left 02/12/2018   Procedure: LOWER EXTREMITY ANGIOGRAPHY;  Surgeon: Algernon Huxley, MD;  Location: [facility];  Service: Cardiovascular;  Laterality: Left;   LOWER EXTREMITY ANGIOGRAPHY Left 03/07/2018   Procedure: LOWER EXTREMITY ANGIOGRAPHY;  Surgeon: Algernon Huxley, MD;  Location: [facility];  Service: Cardiovascular;  Laterality: Left;   LOWER EXTREMITY ANGIOGRAPHY Left 06/04/2018   Procedure: LOWER EXTREMITY ANGIOGRAPHY;  Surgeon: Algernon Huxley, MD;  Location: [facility];  Service: Cardiovascular;  Laterality: Left;   LOWER EXTREMITY ANGIOGRAPHY Left 09/17/2020   Procedure: LOWER EXTREMITY ANGIOGRAPHY;  Surgeon: Algernon Huxley, MD;  Location: [facility];  Service: Cardiovascular;  Laterality: Left;   PLACEMENT OF IMPELLA LEFT VENTRICULAR ASSIST DEVICE N/A 11/11/2019   Procedure: PLACEMENT OF IMPELLA LEFT VENTRICULAR ASSIST DEVICE 5.5;  Surgeon: Ivin Poot, MD;  Location: [facility];  Service: Open Heart Surgery;  Laterality: N/A;  Midline Sternotomy   POLYPECTOMY N/A 09/13/2018  Procedure: POLYPECTOMY;  Surgeon: [name], MD;  Location: [facility];  Service: Endoscopy;  Laterality: N/A;   POLYPECTOMY N/A 02/11/2019   Procedure: POLYPECTOMY;  Surgeon: [name], MD;  Location: [facility];  Service: Endoscopy;  Laterality: N/A;   REMOVAL OF IMPELLA LEFT VENTRICULAR ASSIST DEVICE N/A 11/15/2019   Procedure: REMOVAL OF IMPELLA 5.5 LEFT VENTRICULAR ASSIST DEVICE;  Surgeon: Ivin Poot, MD;  Location: [facility];  Service: Open Heart Surgery;  Laterality: N/A;   RIGHT/LEFT HEART CATH AND CORONARY ANGIOGRAPHY N/A 11/05/2019   Procedure: RIGHT/LEFT HEART CATH AND CORONARY ANGIOGRAPHY;  Surgeon: Nelva Bush, MD;  Location: [facility];  Service: Cardiovascular;  Laterality: N/A;   SPINE SURGERY     TEE WITHOUT CARDIOVERSION N/A 11/11/2019   Procedure: TRANSESOPHAGEAL ECHOCARDIOGRAM (TEE);  Surgeon: Prescott Gum, Collier Salina, MD;  Location: [facility];  Service: Open Heart Surgery;  Laterality: N/A;   TEE WITHOUT CARDIOVERSION N/A 11/15/2019   Procedure: TRANSESOPHAGEAL ECHOCARDIOGRAM (TEE);  Surgeon: Prescott Gum, Collier Salina, MD;  Location: [facility];  Service: Open Heart Surgery;  Laterality: N/A;   TONSILLECTOMY       Social History   Tobacco Use   Smoking status: Former    Packs/day: 1.25    Years: 35.00    Pack years: 43.75    Types: Cigarettes    Quit date: 02/28/2005    Years since quitting: 15.5   Smokeless tobacco: Current    Types: Snuff   Tobacco comments:    occaisionally  Vaping Use   Vaping Use: Never used  Substance Use Topics   Alcohol use: Yes    Alcohol/week: 8.0 - 10.0 standard drinks    Types: 8 - 10 Glasses of wine per week    Comment: weekly   Drug use: No      Family History  Problem Relation Age of Onset   Heart attack Mother    Coronary artery disease Mother    Heart disease Mother        Carotid Stenosis and BPG and Heart Disease before age 62   Diabetes Mother    Hypertension Mother    Heart attack Father    Heart disease Father        BPG and Heart Disease before age 36   Hypertension Father    Cancer Father 17       throat   Stroke Father    Colon cancer Neg Hx    Colon polyps Neg Hx    Esophageal cancer Neg Hx    Rectal cancer Neg Hx    Stomach cancer Neg Hx      Allergies  Allergen Reactions   Brilinta [Ticagrelor] Shortness Of Breath   Chlorhexidine Gluconate Other (See Comments)    Skin burning for hours afterward   Contrast Media [Iodinated Diagnostic Agents] Itching    Face and head flushing, nose itching after contrast administation for angiogram   Statins Other (See Comments)    Failed Crestor 5 mg twice weekly, Crestor 20 mg daily, Pravastatin 40 mg qd, Lipitor, Zocor - muscle aches   Zetia [Ezetimibe] Other (See Comments)    Muscle aches     REVIEW OF SYSTEMS (Negative unless checked)  Constitutional: $RemoveBeforeDE'[]'QdlVinIBtCDwcHd$ Weight loss  $Rem'[]'XdoV$ Fever   '[]'$ Chills Cardiac: $RemoveBeforeD'[]'SFCSchsurUvQrW$ Chest pain   '[]'$ Chest pressure   '[]'$ Palpitations   '[]'$ Shortness of breath when laying flat   '[]'$ Shortness of breath at rest   '[]'$ Shortness of breath with exertion. Vascular:  $RemoveBe'[x]'WWPFxOQbo$ Pain in legs with walking   '[]'$   Pain in legs at rest   [] Pain in legs when laying flat   [x] Claudication   [] Pain in feet when walking  [x] Pain in feet at rest  [] Pain in feet when laying flat   [] History of DVT   [] Phlebitis   [] Swelling in legs   [] Varicose veins   [] Non-healing ulcers Pulmonary:   [] Uses home oxygen   [] Productive cough   [] Hemoptysis   [] Wheeze  [] COPD   [] Asthma Neurologic:  [] Dizziness  [] Blackouts   [] Seizures   [] History of stroke   [] History of TIA  [] Aphasia   [] Temporary blindness   [] Dysphagia   [] Weakness or numbness in arms   [x] Weakness or numbness in legs Musculoskeletal:  [] Arthritis   [] Joint swelling   [] Joint pain   [] Low back pain Hematologic:  [] Easy bruising  [] Easy bleeding   [] Hypercoagulable state   [] Anemic   Gastrointestinal:  [] Blood in stool   [] Vomiting blood  [] Gastroesophageal reflux/heartburn   [] Abdominal pain Genitourinary:  [] Chronic kidney disease   [] Difficult urination  [] Frequent urination  [] Burning with urination   [] Hematuria Skin:  [] Rashes   [] Ulcers   [] Wounds Psychological:  [] History of anxiety   []  History of major depression.  Physical Examination  BP 127/81 (BP Location: Right Arm)   Pulse 68   Resp 16   Ht 5\' 7"  (1.702 m)   Wt 180 lb (81.6 kg)   BMI 28.19 kg/m  Gen:  WD/WN, NAD Head: [place]/AT, No temporalis wasting. Ear/Nose/Throat: Hearing grossly intact, nares w/o erythema or drainage Eyes: Conjunctiva clear. Sclera non-icteric Neck: Supple.  Trachea midline Pulmonary:  Good air movement, no use of accessory muscles.  Cardiac: RRR, no JVD Vascular:  Vessel Right Left  Radial Palpable Palpable                          PT 1+ palpable Not palpable  DP 1+ palpable Not palpable   Gastrointestinal: soft,  non-tender/non-distended. No guarding/reflex.  Musculoskeletal: M/S 5/5 throughout.  No deformity or atrophy.  No significant lower extremity edema. Neurologic: Sensation grossly intact in extremities.  Symmetrical.  Speech is fluent.  Psychiatric: Judgment intact, Mood & affect appropriate for pt's clinical situation. Dermatologic: No rashes or ulcers noted.  No cellulitis or open wounds.      Labs Recent Results (from the past 2160 hour(s))  Basic metabolic panel     Status: Abnormal   Collection Time: 06/29/20  9:06 AM  Result Value Ref Range   Glucose 102 (H) 65 - 99 mg/dL   BUN 42 (H) 8 - 27 mg/dL   Creatinine, Ser 1.50 (H) 0.76 - 1.27 mg/dL   eGFR 50 (L) >59 mL/min/1.73   BUN/Creatinine Ratio 28 (H) 10 - 24   Sodium 141 134 - 144 mmol/L   Potassium 5.4 (H) 3.5 - 5.2 mmol/L   Chloride 100 96 - 106 mmol/L   CO2 25 20 - 29 mmol/L   Calcium 9.4 8.6 - 10.2 mg/dL  TSH     Status: None   Collection Time: 07/24/20  8:56 AM  Result Value Ref Range   TSH 3.260 0.450 - 4.500 uIU/mL  Hepatic function panel     Status: Abnormal   Collection Time: 07/24/20  8:56 AM  Result Value Ref Range   Total Protein 7.3 6.0 - 8.5 g/dL   Albumin 4.5 3.8 - 4.8 g/dL   Bilirubin Total 0.4 0.0 - 1.2 mg/dL   Bilirubin, Direct 0.14 0.00 - 0.40  mg/dL   Alkaline Phosphatase 86 44 - 121 IU/L   AST 40 0 - 40 IU/L   ALT 49 (H) 0 - 44 IU/L  ECHOCARDIOGRAM COMPLETE     Status: None   Collection Time: 08/10/20  8:51 AM  Result Value Ref Range   AR max vel 2.98 cm2   AV Peak grad 7.1 mmHg   Ao pk vel 1.33 m/s   S' Lateral 4.60 cm   Area-P 1/2 3.48 cm2   AV Area VTI 2.57 cm2   AV Mean grad 4.0 mmHg   Single Plane A4C EF 49.9 %   Single Plane A2C EF 43.5 %   Calc EF 48.8 %   AV Area mean vel 2.49 cm2  BUN     Status: Abnormal   Collection Time: 09/17/20  9:19 AM  Result Value Ref Range   BUN 34 (H) 8 - 23 mg/dL    Comment: Performed at [facility], [address] [place], [place]  [address]  Creatinine, serum     Status: None   Collection Time: 09/17/20  9:19 AM  Result Value Ref Range   Creatinine, Ser 0.98 0.61 - 1.24 mg/dL   GFR, Estimated >60 >60 mL/min    Comment: (NOTE) Calculated using the CKD-EPI Creatinine Equation (2021) Performed at [facility], [address] [place], [place] [address]   CUP PACEART REMOTE DEVICE CHECK     Status: None   Collection Time: 09/23/20  4:25 PM  Result Value Ref Range   Date Time Interrogation Session [phone]    Pulse Generator Manufacturer OTHER    Pulse Gen Model [id number] Gallant HF    Pulse Gen Serial Number [id number]    Clinic Name [facility]    Implantable Pulse Generator Type Cardiac Resynch Therapy Defibulator    Implantable Pulse Generator Implant Date 07371062    Implantable Lead Manufacturer [facility]    Implantable Lead Model 1458Q Quartet    Implantable Lead Serial Number [id number]    Implantable Lead Implant Date [id number]    Implantable Lead Location Detail 1 UNKNOWN    Implantable Lead Location [id number]    Implantable Lead Manufacturer [facility]    Implantable Lead Model [id number] Durata SJ4    Implantable Lead Serial Number [id number]    Implantable Lead Implant Date 70350093    Implantable Lead Location Detail 1 UNKNOWN    Implantable Lead Location [id number]    Implantable Lead Manufacturer [employer]    Implantable Lead Model LPA1200M Tendril MRI    Implantable Lead Serial Number [id number]    Implantable Lead Implant Date 81829937    Implantable Lead Location Detail 1 UNKNOWN    Implantable Lead Location [phone]    Lead Channel Setting Sensing Sensitivity 0.5 mV   Lead Channel Setting Sensing Adaptation Mode Adaptive Sensing    Lead Channel Setting Pacing Amplitude 2.0 V   Lead Channel Setting Pacing Pulse Width 0.5 ms   Lead Channel Setting Pacing Amplitude 1.75 V   Lead Channel Setting Pacing Pulse Width 0.5 ms   Lead Channel Setting Pacing Amplitude 2.375    Lead Channel Setting Pacing Capture  Mode Adaptive Capture    Lead Channel Status NULL    Lead Channel Impedance Value 490 ohm   Lead Channel Pacing Threshold Amplitude 1.375 V   Lead Channel Pacing Threshold Pulse Width 0.5 ms   Lead Channel Status NULL    Lead Channel Impedance Value 490 ohm   Lead Channel Sensing Intrinsic Amplitude 2.2 mV  Lead Channel Pacing Threshold Amplitude 0.75 V   Lead Channel Pacing Threshold Pulse Width 0.5 ms   Lead Channel Status NULL    Lead Channel Impedance Value 500 ohm   Lead Channel Sensing Intrinsic Amplitude 12.0 mV   Lead Channel Pacing Threshold Amplitude 0.75 V   Lead Channel Pacing Threshold Pulse Width 0.5 ms   HighPow Impedance 57 ohm   HighPow Imped Status NULL    Battery Status MOS    Battery Remaining Longevity 65 mo   Battery Remaining Percentage 90.0 %   Battery Voltage 2.99 V   Brady Statistic RA Percent Paced 42.0 %   Brady Statistic AP VP Percent 45.0 %   Brady Statistic AS VP Percent 48.0 %   Brady Statistic AP VS Percent 1.0 %   Brady Statistic AS VS Percent 2.3 %    Radiology PERIPHERAL VASCULAR CATHETERIZATION  Result Date: 09/17/2020 See surgical note for result.  VAS [place] ABI WITH/WO TBI  Result Date: 09/22/2020  LOWER EXTREMITY DOPPLER STUDY Patient Name:  RUSELL MENEELY  Date of Exam:   09/15/2020 Medical Rec #: [id number]     Accession #:    [id number] Date of Birth: Jun 27, 1950    Patient Gender: M Patient Age:   69Y Exam Location:  [facility] Procedure:      VAS [place] ABI WITH/WO TBI Referring Phys: [id number] Kris Hartmann --------------------------------------------------------------------------------  Indications: Rest pain, peripheral artery disease, and No complaints at this              time              06/04/2018 Left SFA to TP trunk thrombectomy, and stent.  Vascular Interventions: 03/07/2018 PTA left distal SFA and popliteal artery. Stent                         in the left popliteal artery. Comparison Study: 07/21/2020 Performing Technologist:  Almira Coaster RVS  Examination Guidelines: A complete evaluation includes at minimum, Doppler waveform signals and systolic blood pressure reading at the level of bilateral brachial, anterior tibial, and posterior tibial arteries, when vessel segments are accessible. Bilateral testing is considered an integral part of a complete examination. Photoelectric Plethysmograph (PPG) waveforms and toe systolic pressure readings are included as required and additional duplex testing as needed. Limited examinations for reoccurring indications may be performed as noted.  ABI Findings: +---------+------------------+-----+--------+--------+ Right    Rt Pressure (mmHg)IndexWaveformComment  +---------+------------------+-----+--------+--------+ Brachial 143                                     +---------+------------------+-----+--------+--------+ ATA      165               1.15                  +---------+------------------+-----+--------+--------+ PTA      177               1.24                  +---------+------------------+-----+--------+--------+ Great Toe137               0.96                  +---------+------------------+-----+--------+--------+ +---------+------------------+-----+--------+-------+ Left     Lt Pressure (mmHg)IndexWaveformComment +---------+------------------+-----+--------+-------+ Brachial 139                                    +---------+------------------+-----+--------+-------+  ATA      94                0.66                 +---------+------------------+-----+--------+-------+ PTA      109               0.76                 +---------+------------------+-----+--------+-------+ Great Toe0                 0.00 Absent          +---------+------------------+-----+--------+-------+ +-------+-----------+-----------+------------+------------+ ABI/TBIToday's ABIToday's TBIPrevious ABIPrevious TBI  +-------+-----------+-----------+------------+------------+ Right  1.24       .96        1.15        .56          +-------+-----------+-----------+------------+------------+ Left   .76        0.0        1.09        .66          +-------+-----------+-----------+------------+------------+ Right ABIs and TBIs appear increased compared to prior study on 07/21/2020. Left TBIs and ABIs appear decreased compared to prior study on 07/21/2020.  Summary: Right: Resting right ankle-brachial index is within normal range. No evidence of significant right lower extremity arterial disease. The right toe-brachial index is normal. Left: Resting left ankle-brachial index indicates moderate left lower extremity arterial disease. The left toe-brachial index is abnormal.  *See table(s) above for measurements and observations.  Electronically signed by [name] MD on 09/22/2020 at 5:03:40 PM.    Final    CUP PACEART REMOTE DEVICE CHECK  Result Date: 09/24/2020 Scheduled remote reviewed. Normal device function.  Next remote 91 days. LR  VAS [place] LOWER EXTREMITY ARTERIAL DUPLEX  Result Date: 09/22/2020 LOWER EXTREMITY ARTERIAL DUPLEX STUDY Patient Name:  OREE HISLOP Ohlsen  Date of Exam:   09/15/2020 Medical Rec #: [id number]     Accession #:    [id number] Date of Birth: [birth year]    Patient Gender: M Patient Age:   69Y Exam Location:  [facility] Procedure:      VAS [place] LOWER EXTREMITY ARTERIAL DUPLEX Referring Phys: [id number] [place] --------------------------------------------------------------------------------  Indications: Peripheral artery disease.  Vascular Interventions: 03/07/2018 PTA left distal SFA and popliteal artery. Stent                         in the left popliteal artery. Current ABI:            Rt 1.24, Lt .76 Performing Technologist: Almira Coaster RVS  Examination Guidelines: A complete evaluation includes B-mode imaging, spectral Doppler, color Doppler, and power Doppler as needed of all  accessible portions of each vessel. Bilateral testing is considered an integral part of a complete examination. Limited examinations for reoccurring indications may be performed as noted.   +-----------+--------+-----+--------+----------+--------+ LEFT       PSV cm/sRatioStenosisWaveform  Comments +-----------+--------+-----+--------+----------+--------+ CFA Distal 75                   triphasic          +-----------+--------+-----+--------+----------+--------+ DFA        35                   triphasic          +-----------+--------+-----+--------+----------+--------+ SFA Prox   58  triphasic          +-----------+--------+-----+--------+----------+--------+ SFA Mid    33                   monophasic         +-----------+--------+-----+--------+----------+--------+ SFA Distal 25                   monophasic         +-----------+--------+-----+--------+----------+--------+ POP Distal 36                   monophasic         +-----------+--------+-----+--------+----------+--------+ ATA Distal 14                   monophasic         +-----------+--------+-----+--------+----------+--------+ PTA Distal 21                   monophasic         +-----------+--------+-----+--------+----------+--------+ PERO Distal10                   monophasic         +-----------+--------+-----+--------+----------+--------+  Summary: Left: Imaging and Waveforms obtained throughout in the Left Lower Extremity. Monophasic Waveforms seen predominantly in the Left Lower Extremity. The Left Popliteal Stent is patent; Flow decreases from SFA Mid to the level of the Ankle.  See table(s) above for measurements and observations. Electronically signed by [name] MD on 09/22/2020 at 5:03:43 PM.    Final     Assessment/Plan 1. Atherosclerosis of artery of extremity with rest pain ([place]) Recommend:   The patient has evidence of severe atherosclerotic changes of  both lower extremities with rest pain that is associated with preulcerative changes and impending tissue loss of the foot.  This represents a limb threatening ischemia and places the patient at the risk for limb loss.   This is a difficult situation particularly given the recurrent nature of the occlusion now with multiple episodes of ischemia.  He has known Green femoral disease bilaterally which we had noticed at his last angiogram and had discussed repairing in the future, but at this point I think that is going to have to be moved up.  I am going to bring him in Monday to perform an angiogram to try to open his infrainguinal circulation preliminary to this and then schedule him for surgery Wednesday to perform bilateral femoral endarterectomies.  I discussed the risks and benefits the procedure.  I discussed the reason and rationale for this treatment.        2. Essential hypertension Continue antihypertensive medications as already ordered, these medications have been reviewed and there are no changes at this time.    3. Hyperlipidemia LDL goal <70 Continue statin as ordered and reviewed, no changes at this time    [name], MD  09/25/2020 11:24 AM    This note was created with Dragon medical transcription system.  Any errors from dictation are purely unintentional

## 2020-09-25 NOTE — Telephone Encounter (Signed)
Patient's arrival time has changed for his left leg angio on 09/28/20 with Dr. Lucky Cowboy to 10:45 am. Patient was called and a message was left regarding the time change.

## 2020-09-25 NOTE — Telephone Encounter (Signed)
I spoke with the patient and he is scheduled with Dr. Lucky Cowboy for a left leg angio on 09/28/20  with a 11:30 am arrival time to the [facility]. Pre-procedure instructions were discussed and patient stated he understood.

## 2020-09-25 NOTE — Patient Instructions (Signed)
[employer]'s Vascular and Endovascular Therapy (9th ed., pp. [id number]). [place], [place]: [employer]."> [employer]'s Operating Room Technique [id number] ed., pp. [phone]). [place], [place]: Elsevier."> Vascular and Endovascular Surgery: A Companion to Specialist Surgical Practice (6th ed., pp. [phone]). [place], [place]: [employer].">  Femoral Endarterectomy  Femoral endarterectomy is surgery to clear a blocked femoral artery. The femoral artery is the main blood vessel that supplies blood to the legs. It is found in the groin area. The femoral artery can become blocked by a buildup of fat, cholesterol, calcium, and other substances (plaque). This blockage is also called peripheral vascular disease.Plaque may partially or completely block the artery or cause a blood clot toform, which can be dangerous. Femoral endarterectomy may be done as part of another blood vessel surgery. Abel Presto also be done in combination with a stent procedure. Tell a health care provider about: Any allergies you have. All medicines you are taking, including vitamins, herbs, eye drops, creams, and over-the-counter medicines. Any problems you or family members have had with anesthetic medicines. Any blood disorders you have. Any surgeries you have had. Any medical conditions you have, including diabetes or kidney problems. Whether you are pregnant or may be pregnant. What are the risks? Generally, this is a safe procedure. However, problems may occur, including: Infection. Bleeding. Allergic reactions to medicines or dyes. Nerve damage. Blood clots. These can form in the legs. Clots that break loose could travel to the heart, lungs, or brain. Return of plaque buildup. This may cause another blockage. This can happen months or years after the procedure. What happens before the procedure? Staying hydrated Follow instructions from your health care provider about hydration, which may include: Up to 2 hours before the procedure -  you may continue to drink clear liquids, such as water, clear fruit juice, black coffee, and plain tea.  Eating and drinking restrictions Follow instructions from your health care provider about eating and drinking, which may include: 8 hours before the procedure - stop eating heavy meals or foods, such as meat, fried foods, or fatty foods. 6 hours before the procedure - stop eating light meals or foods, such as toast or cereal. 6 hours before the procedure - stop drinking milk or drinks that contain milk. 2 hours before the procedure - stop drinking clear liquids. Medicines Ask your health care provider about: Starting new medicines or changing or stopping your regular medicines. This is especially important if you are taking diabetes medicines or blood thinners. Taking medicines such as aspirin or ibuprofen. These medicines can thin your blood. Do not take these medicines unless your health care provider tells you to take them. Taking over-the-counter medicines, vitamins, herbs, and supplements. Surgery safety Ask your health care provider: How your surgery site will be marked. What steps will be taken to help prevent infection. These steps may include: Removing hair at the surgery site. Washing skin with a germ-killing soap. Receiving antibiotic medicine. General instructions Do not use any products that contain nicotine or tobacco for at least 4 weeks before the procedure. These products include cigarettes, chewing tobacco, and vaping devices, such as e-cigarettes. If you need help quitting, ask your health care provider. Plan to have a responsible adult take you home from the hospital or clinic. Plan to have a responsible adult care for you for the time you are told after you leave the hospital or clinic. This is important. You may have tests, including: Blood tests. A Doppler ultrasonogram. This test uses sound waves to check blood  flow through your femoral artery and leg. An  angiogram. This involves having dye put into your blood and then having X-rays taken. What happens during the procedure? Small monitors will be placed on your body. These will be used to check your heart rate, breathing rate, blood pressure, and oxygen level. An IV will be inserted into one of your veins. You will be given one or more of the following: A medicine to help you relax (sedative). A medicine to numb the area (local anesthetic). A medicine to make you fall asleep (general anesthetic). A medicine that is injected into your spine to numb the area below and slightly above the injection site (spinal anesthetic). A medicine that is injected into an area of your body to numb everything below the injection site (regional anesthetic). An incision will be made in your groin area, over the blocked part of the femoral artery. A clamp will be placed on the femoral artery above and below the blockage. This will stop blood from flowing through the blocked area. An incision will be made in the femoral artery so the health care provider can access the plaque. The plaque will be stripped away from the inner walls of the artery. The femoral artery will be closed using a patch made of prosthetic material or from one of your own veins. This will make the artery wider. It also helps keep the artery from getting narrow again. The clamp will be removed so that blood can flow through the femoral artery again. The incision in your groin will be closed with staples or stitches (sutures). A bandage (dressing) will be placed over the incision. The procedure may vary among health care providers and hospitals. What happens after the procedure? Your blood pressure, heart rate, breathing rate, and blood oxygen level will be monitored until you leave the hospital or clinic. You may have to wear compression stockings. These stockings help to prevent blood clots and reduce swelling in your legs. You will be given pain  medicine as needed. If you were given a sedative during the procedure, it can affect you for several hours. Do not drive or operate machinery until your health care provider says that it is safe. Summary Femoral endarterectomy is surgery to clear a blocked femoral artery. The femoral artery is the main blood vessel that supplies blood to the legs. It is found in the groin area. Before surgery, ask your health care provider before starting new medicines or changing or stopping your regular medicines. If you were given a sedative during the procedure, you should not drive or operate machinery until your health care provider says that it is safe. This information is not intended to replace advice given to you by your health care provider. Make sure you discuss any questions you have with your healthcare provider. Document Revised: 08/19/2019 Document Reviewed: 08/19/2019 Else[employer] Patient Education  [place].

## 2020-09-25 NOTE — H&P (View-Only) (Signed)
MRN : [id number]  [name] is a 70 y.o. ([birth year]) male who presents with chief complaint of  Chief Complaint  Patient presents with   Follow-up    ultrasound  .  History of Present Illness: Patient returns today in follow up of his PAD.  Only a little over a week ago he underwent left SFA/popliteal/tibial intervention but over the past couple of days he has developed numbness and pain in his feet again.  He had known severe common femoral disease bilaterally which was proximal to the intervention on the left.  Noninvasive studies today show reocclusion of his left SFA and popliteal stents.  He does not have ulceration but he does have symptoms worrisome for rest pain on the left.  He gets some pain in his legs with walking on the right.  He is on Eliquis and aspirin but is intolerant to statin agents  Current Outpatient Medications  Medication Sig Dispense Refill   Alirocumab (PRALUENT) 150 MG/ML SOAJ Inject 1 pen into the skin every 14 (fourteen) days. 2 mL 2   apixaban (ELIQUIS) 5 MG TABS tablet Take 1 tablet (5 mg total) by mouth 2 (two) times daily. 180 tablet 1   aspirin EC 81 MG tablet Take 1 tablet (81 mg total) by mouth daily. 150 tablet 2   carvedilol (COREG) 12.5 MG tablet Take 1 tablet (12.5 mg total) by mouth 2 (two) times daily. 180 tablet 3   colchicine 0.6 MG tablet Take 1 tablet (0.6 mg total) by mouth daily as needed (Gout). 30 tablet 0   nitroGLYCERIN (NITROSTAT) 0.4 MG SL tablet Place 1 tablet (0.4 mg total) under the tongue every 5 (five) minutes as needed for chest pain. 25 tablet 2   sacubitril-valsartan (ENTRESTO) 49-51 MG Take 1 tablet by mouth 2 (two) times daily. 180 tablet 3   traMADol (ULTRAM) 50 MG tablet Take 1 tablet (50 mg total) by mouth every 6 (six) hours as needed. 40 tablet 0   traZODone (DESYREL) 50 MG tablet TAKE 1 TABLET BY MOUTH AT BEDTIME AS NEEDED FOR SLEEP. 90 tablet 1   No current facility-administered medications for this visit.     Past Medical History:  Diagnosis Date   Abnormal nuclear cardiac imaging test 08/08/2015   Arthritis    fingers   Carotid artery occlusion    CHF (congestive heart failure) (HCC)    Coronary atherosclerosis of native coronary artery 01/29/2013   11/05/19 R/LHC 80% dLMCA stenosis small diffusely dz dLAD, chronically occluded OM1 50%mRCA lesion, widely patent mLCx strent, moderately elevated L heart filling pressures, mild to moderate RH filling pressures, normal to moderately reduced CO   Duodenal erosion    Encounter for screening for lung cancer 07/13/2016   Esophageal stenosis    esophageal dilation   GERD (gastroesophageal reflux disease)    H. pylori infection    Heart attack (HCC) Oct. 2009   Mild   Hiatal hernia    Hyperlipidemia    Hypertension    Old myocardial infarction 11/29/2007   Mildly elevated troponin, isolated value in October 2009. Cardiac catheterization-nonobstructive 60% RCA disease-subsequent nuclear stress test-9 minutes, low risk, mild inferior wall hypokinesis    Pain in limb 12/19/2017   Peripheral vascular disease (HCC)    Unstable angina (HCC) 11/25/2017    Past Surgical History:  Procedure Laterality Date   APPENDECTOMY     BACK SURGERY     BIV ICD INSERTION CRT-D N/A 03/23/2020   Procedure: BIV ICD  INSERTION CRT-D;  Surgeon: Lanier Prude, MD;  Location: [facility] INVASIVE CV LAB;  Service: Cardiovascular;  Laterality: N/A;   CARDIAC CATHETERIZATION N/A 08/07/2015   Procedure: Left Heart Cath and Coronary Angiography;  Surgeon: Jake Bathe, MD;  Location: [facility] INVASIVE CV LAB;  Service: Cardiovascular;  Laterality: N/A;   CARDIAC CATHETERIZATION N/A 08/07/2015   Procedure: Coronary Stent Intervention;  Surgeon: Jake Bathe, MD;  Location: [facility] INVASIVE CV LAB;  Service: Cardiovascular;  Laterality: N/A;   CARDIAC CATHETERIZATION N/A 08/07/2015   Procedure: Coronary Stent Intervention;  Surgeon: Peter M Swaziland, MD;  Location: [facility] INVASIVE CV LAB;  Service:  Cardiovascular;  Laterality: N/A;   CAROTID ENDARTERECTOMY  01/05/2006   Right  CEA with DPA   CATARACT EXTRACTION W/ INTRAOCULAR LENS IMPLANT Left 12/04/2017   CATARACT EXTRACTION W/PHACO Left 12/04/2017   Procedure: CATARACT EXTRACTION PHACO AND INTRAOCULAR LENS PLACEMENT (IOC) LEFT;  Surgeon: Nevada Crane, MD;  Location: [facility];  Service: Ophthalmology;  Laterality: Left;   CATARACT EXTRACTION W/PHACO Right 02/06/2018   Procedure: CATARACT EXTRACTION PHACO AND INTRAOCULAR LENS PLACEMENT (IOC)RIGHT;  Surgeon: Nevada Crane, MD;  Location: [facility]ERY [facility]TR;  Service: Ophthalmology;  Laterality: Right;   COLONOSCOPY  05/20/2008   COLONOSCOPY WITH PROPOFOL N/A 09/13/2018   Procedure: COLONOSCOPY WITH BIOPSY;  Surgeon: Midge Minium, MD;  Location: [facility];  Service: Endoscopy;  Laterality: N/A;   CORONARY ARTERY BYPASS GRAFT N/A 11/11/2019   Procedure: CORONARY ARTERY BYPASS GRAFTING (CABG) USING LIMA to Diag1; ENDOSCOPICALLY HARVESTED RIGHT GREATER SAPHENOUS VEIN: SVG to OM1; SVG to OM2; SVG to PDA.;  Surgeon: Kerin Perna, MD;  Location: [facility] OR;  Service: Open Heart Surgery;  Laterality: N/A;   CORONARY STENT PLACEMENT  08/07/2015   MID CIRCUMFLEX   ENDOVEIN HARVEST OF GREATER SAPHENOUS VEIN Right 11/11/2019   Procedure: ENDOVEIN HARVEST OF GREATER SAPHENOUS VEIN;  Surgeon: Kerin Perna, MD;  Location: [facility] OR;  Service: Open Heart Surgery;  Laterality: Right;   ESOPHAGOGASTRODUODENOSCOPY (EGD) WITH PROPOFOL N/A 02/11/2019   Procedure: ESOPHAGOGASTRODUODENOSCOPY (EGD) WITH BIOPSY and  Dilation;  Surgeon: Midge Minium, MD;  Location: [facility];  Service: Endoscopy;  Laterality: N/A;   HIP SURGERY Left 10/2016   left hip tendon repair   LEFT HEART CATH AND CORONARY ANGIOGRAPHY N/A 11/27/2017   Procedure: LEFT HEART CATH AND CORONARY ANGIOGRAPHY;  Surgeon: Iran Ouch, MD;  Location: [facility] INVASIVE CV LAB;  Service: Cardiovascular;  Laterality:  N/A;   LOWER EXTREMITY ANGIOGRAPHY Left 02/12/2018   Procedure: LOWER EXTREMITY ANGIOGRAPHY;  Surgeon: Annice Needy, MD;  Location: [facility] INVASIVE CV LAB;  Service: Cardiovascular;  Laterality: Left;   LOWER EXTREMITY ANGIOGRAPHY Left 03/07/2018   Procedure: LOWER EXTREMITY ANGIOGRAPHY;  Surgeon: Annice Needy, MD;  Location: [facility] INVASIVE CV LAB;  Service: Cardiovascular;  Laterality: Left;   LOWER EXTREMITY ANGIOGRAPHY Left 06/04/2018   Procedure: LOWER EXTREMITY ANGIOGRAPHY;  Surgeon: Annice Needy, MD;  Location: [facility] INVASIVE CV LAB;  Service: Cardiovascular;  Laterality: Left;   LOWER EXTREMITY ANGIOGRAPHY Left 09/17/2020   Procedure: LOWER EXTREMITY ANGIOGRAPHY;  Surgeon: Annice Needy, MD;  Location: [facility] INVASIVE CV LAB;  Service: Cardiovascular;  Laterality: Left;   PLACEMENT OF IMPELLA LEFT VENTRICULAR ASSIST DEVICE N/A 11/11/2019   Procedure: PLACEMENT OF IMPELLA LEFT VENTRICULAR ASSIST DEVICE 5.5;  Surgeon: Kerin Perna, MD;  Location[facility];  Service: Open Heart Surgery;  Laterality: N/A;  Midline Sternotomy   POLYPECTOMY N/A 09/13/2018  Procedure: POLYPECTOMY;  Surgeon: [name], MD;  Location: [place];  Service: Endoscopy;  Laterality: N/A;   POLYPECTOMY N/A 02/11/2019   Procedure: POLYPECTOMY;  Surgeon: [name], MD;  Location: [place];  Service: Endoscopy;  Laterality: N/A;   REMOVAL OF IMPELLA LEFT VENTRICULAR ASSIST DEVICE N/A 11/15/2019   Procedure: REMOVAL OF IMPELLA 5.5 LEFT VENTRICULAR ASSIST DEVICE;  Surgeon: Ivin Poot, MD;  Location: [place];  Service: Open Heart Surgery;  Laterality: N/A;   RIGHT/LEFT HEART CATH AND CORONARY ANGIOGRAPHY N/A 11/05/2019   Procedure: RIGHT/LEFT HEART CATH AND CORONARY ANGIOGRAPHY;  Surgeon: Nelva Bush, MD;  Location: [place] CV LAB;  Service: Cardiovascular;  Laterality: N/A;   SPINE SURGERY     TEE WITHOUT CARDIOVERSION N/A 11/11/2019   Procedure: TRANSESOPHAGEAL ECHOCARDIOGRAM (TEE);  Surgeon: Prescott Gum, Collier Salina, MD;  Location: [place];  Service: Open Heart Surgery;  Laterality: N/A;   TEE WITHOUT CARDIOVERSION N/A 11/15/2019   Procedure: TRANSESOPHAGEAL ECHOCARDIOGRAM (TEE);  Surgeon: Prescott Gum, Collier Salina, MD;  Location: [place];  Service: Open Heart Surgery;  Laterality: N/A;   TONSILLECTOMY       Social History   Tobacco Use   Smoking status: Former    Packs/day: 1.25    Years: 35.00    Pack years: 43.75    Types: Cigarettes    Quit date: 02/28/2005    Years since quitting: 15.5   Smokeless tobacco: Current    Types: Snuff   Tobacco comments:    occaisionally  Vaping Use   Vaping Use: Never used  Substance Use Topics   Alcohol use: Yes    Alcohol/week: 8.0 - 10.0 standard drinks    Types: 8 - 10 Glasses of wine per week    Comment: weekly   Drug use: No      Family History  Problem Relation Age of Onset   Heart attack Mother    Coronary artery disease Mother    Heart disease Mother        Carotid Stenosis and BPG and Heart Disease before age 68   Diabetes Mother    Hypertension Mother    Heart attack Father    Heart disease Father        BPG and Heart Disease before age 64   Hypertension Father    Cancer Father 77       throat   Stroke Father    Colon cancer Neg Hx    Colon polyps Neg Hx    Esophageal cancer Neg Hx    Rectal cancer Neg Hx    Stomach cancer Neg Hx      Allergies  Allergen Reactions   Brilinta [Ticagrelor] Shortness Of Breath   Chlorhexidine Gluconate Other (See Comments)    Skin burning for hours afterward   Contrast Media [Iodinated Diagnostic Agents] Itching    Face and head flushing, nose itching after contrast administation for angiogram   Statins Other (See Comments)    Failed Crestor 5 mg twice weekly, Crestor 20 mg daily, Pravastatin 40 mg qd, Lipitor, Zocor - muscle aches   Zetia [Ezetimibe] Other (See Comments)    Muscle aches     REVIEW OF SYSTEMS (Negative unless checked)  Constitutional: $RemoveBeforeDE'[]'BxrJpJwKMjUMgfQ$ Weight loss  $Rem'[]'VCmE$ Fever   '[]'$ Chills Cardiac: $RemoveBeforeD'[]'USsusNYxnTUDpC$ Chest pain   '[]'$ Chest pressure   '[]'$ Palpitations   '[]'$ Shortness of breath when laying flat   '[]'$ Shortness of breath at rest   '[]'$ Shortness of breath with exertion. Vascular:  $RemoveBe'[x]'SgYPzzmBt$ Pain in legs with walking   '[]'$   Pain in legs at rest   [] Pain in legs when laying flat   [x] Claudication   [] Pain in feet when walking  [x] Pain in feet at rest  [] Pain in feet when laying flat   [] History of DVT   [] Phlebitis   [] Swelling in legs   [] Varicose veins   [] Non-healing ulcers Pulmonary:   [] Uses home oxygen   [] Productive cough   [] Hemoptysis   [] Wheeze  [] COPD   [] Asthma Neurologic:  [] Dizziness  [] Blackouts   [] Seizures   [] History of stroke   [] History of TIA  [] Aphasia   [] Temporary blindness   [] Dysphagia   [] Weakness or numbness in arms   [x] Weakness or numbness in legs Musculoskeletal:  [] Arthritis   [] Joint swelling   [] Joint pain   [] Low back pain Hematologic:  [] Easy bruising  [] Easy bleeding   [] Hypercoagulable state   [] Anemic   Gastrointestinal:  [] Blood in stool   [] Vomiting blood  [] Gastroesophageal reflux/heartburn   [] Abdominal pain Genitourinary:  [] Chronic kidney disease   [] Difficult urination  [] Frequent urination  [] Burning with urination   [] Hematuria Skin:  [] Rashes   [] Ulcers   [] Wounds Psychological:  [] History of anxiety   []  History of major depression.  Physical Examination  BP 127/81 (BP Location: Right Arm)   Pulse 68   Resp 16   Ht 5\' 7"  (1.702 m)   Wt 180 lb (81.6 kg)   BMI 28.19 kg/m  Gen:  WD/WN, NAD Head: [place]/AT, No temporalis wasting. Ear/Nose/Throat: Hearing grossly intact, nares w/o erythema or drainage Eyes: Conjunctiva clear. Sclera non-icteric Neck: Supple.  Trachea midline Pulmonary:  Good air movement, no use of accessory muscles.  Cardiac: RRR, no JVD Vascular:  Vessel Right Left  Radial Palpable Palpable                          PT 1+ palpable Not palpable  DP 1+ palpable Not palpable   Gastrointestinal: soft,  non-tender/non-distended. No guarding/reflex.  Musculoskeletal: M/S 5/5 throughout.  No deformity or atrophy.  No significant lower extremity edema. Neurologic: Sensation grossly intact in extremities.  Symmetrical.  Speech is fluent.  Psychiatric: Judgment intact, Mood & affect appropriate for pt's clinical situation. Dermatologic: No rashes or ulcers noted.  No cellulitis or open wounds.      Labs Recent Results (from the past 2160 hour(s))  Basic metabolic panel     Status: Abnormal   Collection Time: 06/29/20  9:06 AM  Result Value Ref Range   Glucose 102 (H) 65 - 99 mg/dL   BUN 42 (H) 8 - 27 mg/dL   Creatinine, Ser 1.50 (H) 0.76 - 1.27 mg/dL   eGFR 50 (L) >59 mL/min/1.73   BUN/Creatinine Ratio 28 (H) 10 - 24   Sodium 141 134 - 144 mmol/L   Potassium 5.4 (H) 3.5 - 5.2 mmol/L   Chloride 100 96 - 106 mmol/L   CO2 25 20 - 29 mmol/L   Calcium 9.4 8.6 - 10.2 mg/dL  TSH     Status: None   Collection Time: 07/24/20  8:56 AM  Result Value Ref Range   TSH 3.260 0.450 - 4.500 uIU/mL  Hepatic function panel     Status: Abnormal   Collection Time: 07/24/20  8:56 AM  Result Value Ref Range   Total Protein 7.3 6.0 - 8.5 g/dL   Albumin 4.5 3.8 - 4.8 g/dL   Bilirubin Total 0.4 0.0 - 1.2 mg/dL   Bilirubin, Direct 0.14 0.00 - 0.40  mg/dL   Alkaline Phosphatase 86 44 - 121 IU/L   AST 40 0 - 40 IU/L   ALT 49 (H) 0 - 44 IU/L  ECHOCARDIOGRAM COMPLETE     Status: None   Collection Time: 08/10/20  8:51 AM  Result Value Ref Range   AR max vel 2.98 cm2   AV Peak grad 7.1 mmHg   Ao pk vel 1.33 m/s   S' Lateral 4.60 cm   Area-P 1/2 3.48 cm2   AV Area VTI 2.57 cm2   AV Mean grad 4.0 mmHg   Single Plane A4C EF 49.9 %   Single Plane A2C EF 43.5 %   Calc EF 48.8 %   AV Area mean vel 2.49 cm2  BUN     Status: Abnormal   Collection Time: 09/17/20  9:19 AM  Result Value Ref Range   BUN 34 (H) 8 - 23 mg/dL    Comment: Performed at [facility], [address] [place], [place]  [address]  Creatinine, serum     Status: None   Collection Time: 09/17/20  9:19 AM  Result Value Ref Range   Creatinine, Ser 0.98 0.61 - 1.24 mg/dL   GFR, Estimated >60 >60 mL/min    Comment: (NOTE) Calculated using the CKD-EPI Creatinine Equation (2021) Performed at [facility], [address] [place], [place] [address]   CUP PACEART REMOTE DEVICE CHECK     Status: None   Collection Time: 09/23/20  4:25 PM  Result Value Ref Range   Date Time Interrogation Session [id number]    Pulse Generator Manufacturer OTHER    Pulse Gen Model [id number] Gallant HF    Pulse Gen Serial Number [id number]    Clinic Name [facility]    Implantable Pulse Generator Type Cardiac Resynch Therapy Defibulator    Implantable Pulse Generator Implant Date [id number]    Implantable Lead Manufacturer [facility]    Implantable Lead Model 1458Q Quartet    Implantable Lead Serial Number [id number]    Implantable Lead Implant Date 50932671    Implantable Lead Location Detail 1 UNKNOWN    Implantable Lead Location [id number]    Implantable Lead Manufacturer [facility]    Implantable Lead Model [id number] Durata SJ4    Implantable Lead Serial Number [id number]    Implantable Lead Implant Date 24580998    Implantable Lead Location Detail 1 UNKNOWN    Implantable Lead Location [id number]    Implantable Lead Manufacturer [employer]    Implantable Lead Model LPA1200M Tendril MRI    Implantable Lead Serial Number [id number]    Implantable Lead Implant Date 33825053    Implantable Lead Location Detail 1 UNKNOWN    Implantable Lead Location [phone]    Lead Channel Setting Sensing Sensitivity 0.5 mV   Lead Channel Setting Sensing Adaptation Mode Adaptive Sensing    Lead Channel Setting Pacing Amplitude 2.0 V   Lead Channel Setting Pacing Pulse Width 0.5 ms   Lead Channel Setting Pacing Amplitude 1.75 V   Lead Channel Setting Pacing Pulse Width 0.5 ms   Lead Channel Setting Pacing Amplitude 2.375    Lead Channel Setting Pacing Capture  Mode Adaptive Capture    Lead Channel Status NULL    Lead Channel Impedance Value 490 ohm   Lead Channel Pacing Threshold Amplitude 1.375 V   Lead Channel Pacing Threshold Pulse Width 0.5 ms   Lead Channel Status NULL    Lead Channel Impedance Value 490 ohm   Lead Channel Sensing Intrinsic Amplitude 2.2 mV  Lead Channel Pacing Threshold Amplitude 0.75 V   Lead Channel Pacing Threshold Pulse Width 0.5 ms   Lead Channel Status NULL    Lead Channel Impedance Value 500 ohm   Lead Channel Sensing Intrinsic Amplitude 12.0 mV   Lead Channel Pacing Threshold Amplitude 0.75 V   Lead Channel Pacing Threshold Pulse Width 0.5 ms   HighPow Impedance 57 ohm   HighPow Imped Status NULL    Battery Status MOS    Battery Remaining Longevity 65 mo   Battery Remaining Percentage 90.0 %   Battery Voltage 2.99 V   Brady Statistic RA Percent Paced 42.0 %   Brady Statistic AP VP Percent 45.0 %   Brady Statistic AS VP Percent 48.0 %   Brady Statistic AP VS Percent 1.0 %   Brady Statistic AS VS Percent 2.3 %    Radiology PERIPHERAL VASCULAR CATHETERIZATION  Result Date: 09/17/2020 See surgical note for result.  VAS [place] ABI WITH/WO TBI  Result Date: 09/22/2020  LOWER EXTREMITY DOPPLER STUDY Patient Name:  HERNDON GRILL  Date of Exam:   09/15/2020 Medical Rec #: [id number]     Accession #:    [id number] Date of Birth: 21-Oct-1950    Patient Gender: M Patient Age:   69Y Exam Location:  [facility] Procedure:      VAS [place] ABI WITH/WO TBI Referring Phys: [id number] Kris Hartmann --------------------------------------------------------------------------------  Indications: Rest pain, peripheral artery disease, and No complaints at this              time              06/04/2018 Left SFA to TP trunk thrombectomy, and stent.  Vascular Interventions: 03/07/2018 PTA left distal SFA and popliteal artery. Stent                         in the left popliteal artery. Comparison Study: 07/21/2020 Performing Technologist:  Almira Coaster RVS  Examination Guidelines: A complete evaluation includes at minimum, Doppler waveform signals and systolic blood pressure reading at the level of bilateral brachial, anterior tibial, and posterior tibial arteries, when vessel segments are accessible. Bilateral testing is considered an integral part of a complete examination. Photoelectric Plethysmograph (PPG) waveforms and toe systolic pressure readings are included as required and additional duplex testing as needed. Limited examinations for reoccurring indications may be performed as noted.  ABI Findings: +---------+------------------+-----+--------+--------+ Right    Rt Pressure (mmHg)IndexWaveformComment  +---------+------------------+-----+--------+--------+ Brachial 143                                     +---------+------------------+-----+--------+--------+ ATA      165               1.15                  +---------+------------------+-----+--------+--------+ PTA      177               1.24                  +---------+------------------+-----+--------+--------+ Great Toe137               0.96                  +---------+------------------+-----+--------+--------+ +---------+------------------+-----+--------+-------+ Left     Lt Pressure (mmHg)IndexWaveformComment +---------+------------------+-----+--------+-------+ Brachial 139                                    +---------+------------------+-----+--------+-------+  ATA      94                0.66                 +---------+------------------+-----+--------+-------+ PTA      109               0.76                 +---------+------------------+-----+--------+-------+ Great Toe0                 0.00 Absent          +---------+------------------+-----+--------+-------+ +-------+-----------+-----------+------------+------------+ ABI/TBIToday's ABIToday's TBIPrevious ABIPrevious TBI  +-------+-----------+-----------+------------+------------+ Right  1.24       .96        1.15        .56          +-------+-----------+-----------+------------+------------+ Left   .76        0.0        1.09        .66          +-------+-----------+-----------+------------+------------+ Right ABIs and TBIs appear increased compared to prior study on 07/21/2020. Left TBIs and ABIs appear decreased compared to prior study on 07/21/2020.  Summary: Right: Resting right ankle-brachial index is within normal range. No evidence of significant right lower extremity arterial disease. The right toe-brachial index is normal. Left: Resting left ankle-brachial index indicates moderate left lower extremity arterial disease. The left toe-brachial index is abnormal.  *See table(s) above for measurements and observations.  Electronically signed by [name] MD on 09/22/2020 at 5:03:40 PM.    Final    CUP PACEART REMOTE DEVICE CHECK  Result Date: 09/24/2020 Scheduled remote reviewed. Normal device function.  Next remote 91 days. LR  VAS [place] LOWER EXTREMITY ARTERIAL DUPLEX  Result Date: 09/22/2020 LOWER EXTREMITY ARTERIAL DUPLEX STUDY Patient Name:  [name]  Date of Exam:   09/15/2020 Medical Rec #: [id number]     Accession #:    [id number] Date of Birth: [birth year]    Patient Gender: M Patient Age:   69Y Exam Location:  [facility] Procedure:      VAS [place] LOWER EXTREMITY ARTERIAL DUPLEX Referring Phys: [id number] [place] --------------------------------------------------------------------------------  Indications: Peripheral artery disease.  Vascular Interventions: 03/07/2018 PTA left distal SFA and popliteal artery. Stent                         in the left popliteal artery. Current ABI:            Rt 1.24, Lt .76 Performing Technologist: Almira Coaster RVS  Examination Guidelines: A complete evaluation includes B-mode imaging, spectral Doppler, color Doppler, and power Doppler as needed of all  accessible portions of each vessel. Bilateral testing is considered an integral part of a complete examination. Limited examinations for reoccurring indications may be performed as noted.   +-----------+--------+-----+--------+----------+--------+ LEFT       PSV cm/sRatioStenosisWaveform  Comments +-----------+--------+-----+--------+----------+--------+ CFA Distal 75                   triphasic          +-----------+--------+-----+--------+----------+--------+ DFA        35                   triphasic          +-----------+--------+-----+--------+----------+--------+ SFA Prox   58  triphasic          +-----------+--------+-----+--------+----------+--------+ SFA Mid    33                   monophasic         +-----------+--------+-----+--------+----------+--------+ SFA Distal 25                   monophasic         +-----------+--------+-----+--------+----------+--------+ POP Distal 36                   monophasic         +-----------+--------+-----+--------+----------+--------+ ATA Distal 14                   monophasic         +-----------+--------+-----+--------+----------+--------+ PTA Distal 21                   monophasic         +-----------+--------+-----+--------+----------+--------+ PERO Distal10                   monophasic         +-----------+--------+-----+--------+----------+--------+  Summary: Left: Imaging and Waveforms obtained throughout in the Left Lower Extremity. Monophasic Waveforms seen predominantly in the Left Lower Extremity. The Left Popliteal Stent is patent; Flow decreases from SFA Mid to the level of the Ankle.  See table(s) above for measurements and observations. Electronically signed by [name] MD on 09/22/2020 at 5:03:43 PM.    Final     Assessment/Plan 1. Atherosclerosis of artery of extremity with rest pain ([place]) Recommend:   The patient has evidence of severe atherosclerotic changes of  both lower extremities with rest pain that is associated with preulcerative changes and impending tissue loss of the foot.  This represents a limb threatening ischemia and places the patient at the risk for limb loss.   This is a difficult situation particularly given the recurrent nature of the occlusion now with multiple episodes of ischemia.  He has known common femoral disease bilaterally which we had noticed at his last angiogram and had discussed repairing in the future, but at this point I think that is going to have to be moved up.  I am going to bring him in Monday to perform an angiogram to try to open his infrainguinal circulation preliminary to this and then schedule him for surgery Wednesday to perform bilateral femoral endarterectomies.  I discussed the risks and benefits the procedure.  I discussed the reason and rationale for this treatment.        2. Essential hypertension Continue antihypertensive medications as already ordered, these medications have been reviewed and there are no changes at this time.    3. Hyperlipidemia LDL goal <70 Continue statin as ordered and reviewed, no changes at this time    [name], MD  09/25/2020 11:24 AM    This note was created with Dragon medical transcription system.  Any errors from dictation are purely unintentional

## 2020-09-28 ENCOUNTER — Encounter
Admission: RE | Disposition: A | Discharge status: Home / Self Care | Payer: Self-pay | Source: Home / Self Care | Attending: Vascular Surgery

## 2020-09-28 ENCOUNTER — Inpatient Hospital Stay
Admission: RE | Admit: 2020-09-28 | Discharge: 2020-10-02 | DRG: 272 | Disposition: A | Discharge status: Home / Self Care | Payer: PPO | Attending: Vascular Surgery | Admitting: Vascular Surgery

## 2020-09-28 ENCOUNTER — Encounter: Payer: Self-pay | Admitting: Anesthesiology

## 2020-09-28 ENCOUNTER — Other Ambulatory Visit: Payer: Self-pay

## 2020-09-28 ENCOUNTER — Other Ambulatory Visit (INDEPENDENT_AMBULATORY_CARE_PROVIDER_SITE_OTHER): Payer: Self-pay | Admitting: Nurse Practitioner

## 2020-09-28 DIAGNOSIS — Z951 Presence of aortocoronary bypass graft: Secondary | ICD-10-CM | POA: Diagnosis not present

## 2020-09-28 DIAGNOSIS — Y831 Surgical operation with implant of artificial internal device as the cause of abnormal reaction of the patient, or of later complication, without mention of misadventure at the time of the procedure: Secondary | ICD-10-CM | POA: Diagnosis present

## 2020-09-28 DIAGNOSIS — I743 Embolism and thrombosis of arteries of the lower extremities: Secondary | ICD-10-CM | POA: Diagnosis not present

## 2020-09-28 DIAGNOSIS — E785 Hyperlipidemia, unspecified: Secondary | ICD-10-CM | POA: Diagnosis present

## 2020-09-28 DIAGNOSIS — I10 Essential (primary) hypertension: Secondary | ICD-10-CM | POA: Diagnosis present

## 2020-09-28 DIAGNOSIS — T82856A Stenosis of peripheral vascular stent, initial encounter: Secondary | ICD-10-CM | POA: Diagnosis not present

## 2020-09-28 DIAGNOSIS — Z79899 Other long term (current) drug therapy: Secondary | ICD-10-CM

## 2020-09-28 DIAGNOSIS — Z8249 Family history of ischemic heart disease and other diseases of the circulatory system: Secondary | ICD-10-CM | POA: Diagnosis not present

## 2020-09-28 DIAGNOSIS — I70223 Atherosclerosis of native arteries of extremities with rest pain, bilateral legs: Secondary | ICD-10-CM | POA: Diagnosis present

## 2020-09-28 DIAGNOSIS — Z7982 Long term (current) use of aspirin: Secondary | ICD-10-CM

## 2020-09-28 DIAGNOSIS — I70229 Atherosclerosis of native arteries of extremities with rest pain, unspecified extremity: Secondary | ICD-10-CM | POA: Diagnosis not present

## 2020-09-28 DIAGNOSIS — I252 Old myocardial infarction: Secondary | ICD-10-CM

## 2020-09-28 DIAGNOSIS — I998 Other disorder of circulatory system: Secondary | ICD-10-CM | POA: Diagnosis not present

## 2020-09-28 DIAGNOSIS — M62262 Nontraumatic ischemic infarction of muscle, left lower leg: Secondary | ICD-10-CM | POA: Diagnosis not present

## 2020-09-28 DIAGNOSIS — Z7901 Long term (current) use of anticoagulants: Secondary | ICD-10-CM

## 2020-09-28 DIAGNOSIS — I2510 Atherosclerotic heart disease of native coronary artery without angina pectoris: Secondary | ICD-10-CM | POA: Diagnosis present

## 2020-09-28 DIAGNOSIS — Z955 Presence of coronary angioplasty implant and graft: Secondary | ICD-10-CM

## 2020-09-28 DIAGNOSIS — Z833 Family history of diabetes mellitus: Secondary | ICD-10-CM

## 2020-09-28 DIAGNOSIS — Z20822 Contact with and (suspected) exposure to covid-19: Secondary | ICD-10-CM | POA: Diagnosis not present

## 2020-09-28 DIAGNOSIS — Z823 Family history of stroke: Secondary | ICD-10-CM

## 2020-09-28 DIAGNOSIS — Z87891 Personal history of nicotine dependence: Secondary | ICD-10-CM | POA: Diagnosis not present

## 2020-09-28 DIAGNOSIS — I70222 Atherosclerosis of native arteries of extremities with rest pain, left leg: Secondary | ICD-10-CM | POA: Diagnosis not present

## 2020-09-28 HISTORY — PX: LOWER EXTREMITY ANGIOGRAPHY: CATH118251

## 2020-09-28 LAB — COMPREHENSIVE METABOLIC PANEL
ALT: 23 U/L (ref 0–44)
AST: 19 U/L (ref 15–41)
Albumin: 3.8 g/dL (ref 3.5–5.0)
Alkaline Phosphatase: 58 U/L (ref 38–126)
Anion gap: 10 (ref 5–15)
BUN: 28 mg/dL — ABNORMAL HIGH (ref 8–23)
CO2: 25 mmol/L (ref 22–32)
Calcium: 8.5 mg/dL — ABNORMAL LOW (ref 8.9–10.3)
Chloride: 104 mmol/L (ref 98–111)
Creatinine, Ser: 0.9 mg/dL (ref 0.61–1.24)
GFR, Estimated: >60 mL/min (ref >60)
Glucose, Bld: 118 mg/dL — ABNORMAL HIGH (ref 70–99)
Potassium: 4.6 mmol/L (ref 3.5–5.1)
Sodium: 139 mmol/L (ref 135–145)
Total Bilirubin: 0.9 mg/dL (ref 0.3–1.2)
Total Protein: 6.8 g/dL (ref 6.5–8.1)

## 2020-09-28 LAB — URINALYSIS, ROUTINE W REFLEX MICROSCOPIC
Bilirubin Urine: NEGATIVE
Glucose, UA: NEGATIVE mg/dL
Hgb urine dipstick: NEGATIVE
Ketones, ur: NEGATIVE mg/dL
Leukocytes,Ua: NEGATIVE
Nitrite: NEGATIVE
Protein, ur: NEGATIVE mg/dL
Specific Gravity, Urine: 1.031 — ABNORMAL HIGH (ref 1.005–1.030)
pH: 7 (ref 5.0–8.0)

## 2020-09-28 LAB — RESP PANEL BY RT-PCR (FLU A&B, COVID) ARPGX2
Influenza A by PCR: NEGATIVE
Influenza B by PCR: NEGATIVE
SARS Coronavirus 2 by RT PCR: NEGATIVE

## 2020-09-28 LAB — CBC
HCT: 39.9 % (ref 39.0–52.0)
Hemoglobin: 13.6 g/dL (ref 13.0–17.0)
MCH: 33.3 pg (ref 26.0–34.0)
MCHC: 34.1 g/dL (ref 30.0–36.0)
MCV: 97.6 fL (ref 80.0–100.0)
Platelets: 141 10*3/uL — ABNORMAL LOW (ref 150–400)
RBC: 4.09 MIL/uL — ABNORMAL LOW (ref 4.22–5.81)
RDW: 13.8 % (ref 11.5–15.5)
WBC: 4.2 10*3/uL (ref 4.0–10.5)
nRBC: 0.5 % — ABNORMAL HIGH (ref 0.0–0.2)

## 2020-09-28 LAB — PROTIME-INR
INR: 1.1 (ref 0.8–1.2)
Prothrombin Time: 14.3 seconds (ref 11.4–15.2)

## 2020-09-28 LAB — CREATININE, SERUM
Creatinine, Ser: 0.99 mg/dL (ref 0.61–1.24)
GFR, Estimated: >60 mL/min (ref >60)

## 2020-09-28 LAB — BUN: BUN: 29 mg/dL — ABNORMAL HIGH (ref 8–23)

## 2020-09-28 SURGERY — LOWER EXTREMITY ANGIOGRAPHY
Anesthesia: Moderate Sedation | Site: Leg Lower | Laterality: Left

## 2020-09-28 MED ORDER — HEPARIN SODIUM (PORCINE) 1000 UNIT/ML IJ SOLN
INTRAMUSCULAR | Status: DC | PRN
  Administered 2020-09-28: 5000 [IU] via INTRAVENOUS

## 2020-09-28 MED ORDER — ONDANSETRON HCL 4 MG/2ML IJ SOLN
4.0000 mg | Freq: Four times a day (QID) | INTRAMUSCULAR | Status: DC | PRN
  Administered 2020-09-30: 4 mg via INTRAVENOUS
  Filled 2020-09-28: qty 2

## 2020-09-28 MED ORDER — CEFAZOLIN SODIUM-DEXTROSE 2-4 GM/100ML-% IV SOLN
2.0000 g | Freq: Once | INTRAVENOUS | Status: AC
  Administered 2020-09-28: 2 g via INTRAVENOUS

## 2020-09-28 MED ORDER — POTASSIUM CHLORIDE CRYS ER 20 MEQ PO TBCR: 20.0000 meq | EXTENDED_RELEASE_TABLET | Freq: Once | ORAL | Status: DC

## 2020-09-28 MED ORDER — TIROFIBAN HCL IN NACL 5-0.9 MG/100ML-% IV SOLN
0.1500 ug/kg/min | INTRAVENOUS | Status: DC
  Administered 2020-09-28 – 2020-09-29 (×2): 0.15 ug/kg/min via INTRAVENOUS
  Filled 2020-09-28 (×3): qty 100

## 2020-09-28 MED ORDER — LABETALOL HCL 5 MG/ML IV SOLN: 10.0000 mg | INTRAVENOUS | Status: DC | PRN

## 2020-09-28 MED ORDER — MIDAZOLAM HCL 5 MG/5ML IJ SOLN
INTRAMUSCULAR | Status: AC
  Filled 2020-09-28: qty 5

## 2020-09-28 MED ORDER — SACUBITRIL-VALSARTAN 49-51 MG PO TABS
1.0000 | ORAL_TABLET | Freq: Two times a day (BID) | ORAL | Status: DC
  Administered 2020-09-28 – 2020-10-02 (×7): 1 via ORAL
  Filled 2020-09-28 (×10): qty 1

## 2020-09-28 MED ORDER — PANTOPRAZOLE SODIUM 40 MG PO TBEC
40.0000 mg | DELAYED_RELEASE_TABLET | Freq: Every day | ORAL | Status: DC
  Administered 2020-09-28 – 2020-10-02 (×5): 40 mg via ORAL
  Filled 2020-09-28 (×5): qty 1

## 2020-09-28 MED ORDER — MIDAZOLAM HCL 2 MG/ML PO SYRP
8.0000 mg | ORAL_SOLUTION | Freq: Once | ORAL | Status: AC | PRN
  Administered 2020-09-28: 8 mg via ORAL

## 2020-09-28 MED ORDER — FAMOTIDINE 20 MG PO TABS
ORAL_TABLET | ORAL | Status: AC
  Administered 2020-09-28: 40 mg
  Filled 2020-09-28: qty 2

## 2020-09-28 MED ORDER — DIPHENHYDRAMINE HCL 50 MG/ML IJ SOLN
INTRAMUSCULAR | Status: AC
  Administered 2020-09-28: 50 mg via INTRAVENOUS
  Filled 2020-09-28: qty 1

## 2020-09-28 MED ORDER — PHENOL 1.4 % MT LIQD
1.0000 | OROMUCOSAL | Status: DC | PRN
  Filled 2020-09-28: qty 177

## 2020-09-28 MED ORDER — TRAZODONE HCL 50 MG PO TABS
50.0000 mg | ORAL_TABLET | Freq: Every evening | ORAL | Status: DC | PRN
  Administered 2020-09-28 – 2020-10-01 (×3): 50 mg via ORAL
  Filled 2020-09-28 (×3): qty 1

## 2020-09-28 MED ORDER — FAMOTIDINE 20 MG PO TABS: 40.0000 mg | ORAL_TABLET | Freq: Once | ORAL | Status: AC | PRN

## 2020-09-28 MED ORDER — GUAIFENESIN-DM 100-10 MG/5ML PO SYRP: 15.0000 mL | ORAL_SOLUTION | ORAL | Status: DC | PRN

## 2020-09-28 MED ORDER — FENTANYL CITRATE (PF) 100 MCG/2ML IJ SOLN
INTRAMUSCULAR | Status: DC | PRN
  Administered 2020-09-28: 50 ug via INTRAVENOUS

## 2020-09-28 MED ORDER — ALTEPLASE 2 MG IJ SOLR
INTRAMUSCULAR | Status: AC
  Filled 2020-09-28: qty 6

## 2020-09-28 MED ORDER — NITROGLYCERIN 0.4 MG SL SUBL: 0.4000 mg | SUBLINGUAL_TABLET | SUBLINGUAL | Status: DC | PRN

## 2020-09-28 MED ORDER — ONDANSETRON HCL 4 MG/2ML IJ SOLN: 4.0000 mg | Freq: Four times a day (QID) | INTRAMUSCULAR | Status: DC | PRN

## 2020-09-28 MED ORDER — METOPROLOL TARTRATE 5 MG/5ML IV SOLN: 2.0000 mg | INTRAVENOUS | Status: DC | PRN

## 2020-09-28 MED ORDER — HYDROMORPHONE HCL 1 MG/ML IJ SOLN: 1.0000 mg | Freq: Once | INTRAMUSCULAR | Status: DC | PRN

## 2020-09-28 MED ORDER — TIROFIBAN HCL IV 12.5 MG/250 ML
INTRAVENOUS | Status: AC
  Administered 2020-09-28: 0.15 ug/kg/min via INTRAVENOUS
  Filled 2020-09-28: qty 250

## 2020-09-28 MED ORDER — ALUM & MAG HYDROXIDE-SIMETH 200-200-20 MG/5ML PO SUSP: 15.0000 mL | ORAL | Status: DC | PRN

## 2020-09-28 MED ORDER — COLCHICINE 0.6 MG PO TABS
0.6000 mg | ORAL_TABLET | Freq: Every day | ORAL | Status: DC | PRN
  Filled 2020-09-28: qty 1

## 2020-09-28 MED ORDER — METHYLPREDNISOLONE SODIUM SUCC 125 MG IJ SOLR
INTRAMUSCULAR | Status: AC
  Filled 2020-09-28: qty 2

## 2020-09-28 MED ORDER — METHYLPREDNISOLONE SODIUM SUCC 125 MG IJ SOLR
125.0000 mg | Freq: Once | INTRAMUSCULAR | Status: AC | PRN
  Administered 2020-09-28: 125 mg via INTRAVENOUS

## 2020-09-28 MED ORDER — DIPHENHYDRAMINE HCL 50 MG/ML IJ SOLN: 50.0000 mg | Freq: Once | INTRAMUSCULAR | Status: AC | PRN

## 2020-09-28 MED ORDER — ASPIRIN EC 81 MG PO TBEC
81.0000 mg | DELAYED_RELEASE_TABLET | Freq: Every day | ORAL | Status: DC
  Administered 2020-09-28 – 2020-10-02 (×5): 81 mg via ORAL
  Filled 2020-09-28 (×5): qty 1

## 2020-09-28 MED ORDER — SODIUM CHLORIDE 0.9 % IV SOLN: INTRAVENOUS | Status: DC

## 2020-09-28 MED ORDER — HYDRALAZINE HCL 20 MG/ML IJ SOLN: 5.0000 mg | INTRAMUSCULAR | Status: DC | PRN

## 2020-09-28 MED ORDER — ALTEPLASE 2 MG IJ SOLR
INTRAMUSCULAR | Status: DC | PRN
  Administered 2020-09-28: 6 mg

## 2020-09-28 MED ORDER — CARVEDILOL 12.5 MG PO TABS
12.5000 mg | ORAL_TABLET | Freq: Two times a day (BID) | ORAL | Status: DC
  Administered 2020-09-28 – 2020-10-02 (×7): 12.5 mg via ORAL
  Filled 2020-09-28 (×8): qty 1

## 2020-09-28 MED ORDER — HEPARIN SODIUM (PORCINE) 1000 UNIT/ML IJ SOLN
INTRAMUSCULAR | Status: AC
  Filled 2020-09-28: qty 1

## 2020-09-28 MED ORDER — MIDAZOLAM HCL 2 MG/2ML IJ SOLN
INTRAMUSCULAR | Status: DC | PRN
  Administered 2020-09-28: 2 mg via INTRAVENOUS

## 2020-09-28 MED ORDER — FENTANYL CITRATE (PF) 100 MCG/2ML IJ SOLN
INTRAMUSCULAR | Status: AC
  Filled 2020-09-28: qty 2

## 2020-09-28 MED ORDER — TRAMADOL HCL 50 MG PO TABS
50.0000 mg | ORAL_TABLET | Freq: Four times a day (QID) | ORAL | Status: DC | PRN
  Administered 2020-09-29: 50 mg via ORAL
  Filled 2020-09-28: qty 1

## 2020-09-28 MED ORDER — MIDAZOLAM HCL 2 MG/ML PO SYRP
ORAL_SOLUTION | ORAL | Status: AC
  Filled 2020-09-28: qty 4

## 2020-09-28 MED ORDER — TIROFIBAN (AGGRASTAT) BOLUS VIA INFUSION
25.0000 ug/kg | Freq: Once | INTRAVENOUS | Status: AC
  Administered 2020-09-28: 2040 ug via INTRAVENOUS
  Filled 2020-09-28: qty 41

## 2020-09-28 SURGICAL SUPPLY — 23 items
BALLN LUTONIX 018 4X40X130 (BALLOONS) ×2
BALLN LUTONIX 018 5X150X130 (BALLOONS) ×2
BALLN LUTONIX 5X220X130 (BALLOONS) ×2
BALLOON LUTONIX 018 4X40X130 (BALLOONS) IMPLANT
BALLOON LUTONIX 018 5X150X130 (BALLOONS) IMPLANT
BALLOON LUTONIX 5X220X130 (BALLOONS) IMPLANT
CANISTER PENUMBRA ENGINE (MISCELLANEOUS) ×1 IMPLANT
CATH ANGIO 5F PIGTAIL 65CM (CATHETERS) ×1 IMPLANT
CATH INDIGO CAT6 KIT (CATHETERS) ×1 IMPLANT
CATH VERT 5X100 (CATHETERS) ×1 IMPLANT
COVER PROBE U/S 5X48 (MISCELLANEOUS) ×1 IMPLANT
DEVICE SAFEGUARD 24CM (GAUZE/BANDAGES/DRESSINGS) ×1 IMPLANT
DEVICE STARCLOSE SE CLOSURE (Vascular Products) ×1 IMPLANT
GLIDEWIRE ADV .035X260CM (WIRE) ×1 IMPLANT
KIT ENCORE 26 ADVANTAGE (KITS) ×1 IMPLANT
PACK ANGIOGRAPHY (CUSTOM PROCEDURE TRAY) ×2 IMPLANT
SHEATH BRITE TIP 5FRX11 (SHEATH) ×1 IMPLANT
SHEATH DESTIN RDC 6FR 45 (SHEATH) ×1 IMPLANT
STENT VIABAHN 6X150X120 (Permanent Stent) ×1 IMPLANT
SYR MEDRAD MARK 7 150ML (SYRINGE) ×1 IMPLANT
TUBING CONTRAST HIGH PRESS 72 (TUBING) ×1 IMPLANT
WIRE G V18X300CM (WIRE) ×1 IMPLANT
WIRE GUIDERIGHT .035X150 (WIRE) ×1 IMPLANT

## 2020-09-28 NOTE — Op Note (Signed)
[facility]  Percutaneous Study/Intervention Procedural Note   Date of Surgery: 09/28/2020  Surgeon(s):,    Assistants:none  Pre-operative Diagnosis: PAD with rest pain left lower extremity  Post-operative diagnosis:  Same  Procedure(s) Performed:             1.  Ultrasound guidance for vascular access right femoral artery             2.  Catheter placement into left common femoral artery from right femoral approach             3.  Aortogram and selective left lower extremity angiogram             4.  Catheter directed thrombolytic therapy with 6 mg of tPA in the left tibioperoneal trunk popliteal, superficial femoral arteries             5.  Mechanical thrombectomy with the penumbra CAT 6 catheter the left SFA, popliteal artery, tibioperoneal trunk, and posterior tibial artery  6.  Percutaneous transluminal angioplasty of left tibioperoneal trunk and most proximal posterior tibial artery with 4 mm diameter by 4 cm length Lutonix drug-coated angioplasty balloon  7.  Percutaneous transluminal angioplasty of the left SFA and popliteal arteries with 5 mm 22 cm length Lutonix drug-coated angioplasty balloon  8.  Viabahn stent placement with 6 mm diameter by 15 cm length stent to the left SFA and above-knee popliteal arteries             9.  StarClose closure device right femoral artery  EBL: 200 cc  Contrast: 40 cc  Fluoro Time: 6.8 minutes  Moderate Conscious Sedation Time: approximately 45 minutes using 2 mg of Versed and 50 mcg of Fentanyl              Indications:  Patient is a 70 y.o.male with recurrent ischemia of the left lower extremity. The patient has noninvasive study showing occlusion of his previous SFA and popliteal stents.  He has known significant common femoral artery lesions bilaterally that will require intervention with endarterectomy. The patient is brought in for angiography for further evaluation and potential treatment.  Due to the  limb threatening nature of the situation, angiogram was performed for attempted limb salvage. The patient is aware that if the procedure fails, amputation would be expected.  The patient also understands that even with successful revascularization, amputation may still be required due to the severity of the situation.  Risks and benefits are discussed and informed consent is obtained.   Procedure:  The patient was identified and appropriate procedural time out was performed.  The patient was then placed supine on the table and prepped and draped in the usual sterile fashion. Moderate conscious sedation was administered during a face to face encounter with the patient throughout the procedure with my supervision of the RN administering medicines and monitoring the patient's vital signs, pulse oximetry, telemetry and mental status throughout from the start of the procedure until the patient was taken to the recovery room. Ultrasound was used to evaluate the right common femoral artery.  It was heavily diseased.  A digital ultrasound image was acquired.  A Seldinger needle was used to access the right common femoral artery under direct ultrasound guidance and a permanent image was performed.  A 0.035 J wire was advanced without resistance and a 5Fr sheath was placed.  Pigtail catheter was placed into the aorta and an AP aortogram was performed. This demonstrated normal renal arteries and  normal aorta and iliac segments without significant stenosis. I then crossed the aortic bifurcation and advanced to the left femoral head. Selective left lower extremity angiogram was then performed. This demonstrated The common femoral artery and profunda femoris artery were heavily diseased with calcific stenosis greater than 80%.  The proximal to mid SFA were patent, but there was an abrupt occlusion within the most recently placed life stent in the mid SFA with reconstitution of posterior tibial artery distally as the best runoff  to the foot. It was felt that it was in the patient's best interest to proceed with intervention after these images to avoid a second procedure and a larger amount of contrast and fluoroscopy based off of the findings from the initial angiogram. The patient was systemically heparinized and a 6 [place] [employer] sheath was then placed over the [employer] wire. I then used a Kumpe catheter and the advantage wire to cross the occlusion in the SFA and popliteal arteries as well as the tibioperoneal trunk and the proximal portion of the posterior tibial artery without difficulty and then exchanged for a V 18 wire.  I then instilled 6 mg of tPA from the proximal posterior tibial artery, up to the tibioperoneal trunk, popliteal artery, and mid SFA.  After this dwelled, mechanical thrombectomy was then performed with the penumbra CAT 6 device to the left SFA, popliteal artery, tibioperoneal trunk, and proximal posterior tibial arteries.  2 passes were made.  Thrombus was removed and this resulted in restoration of the flow channel, but there remained thrombus and stenosis particularly in the midportion of the previously placed stents and just below the previously placed stents.  I treated these areas with angioplasty initially.  A 4 mm diameter by 4 cm length Lutonix drug-coated angioplasty balloon was inflated to 10 atm for 1 minute in the tibioperoneal trunk and most proximal posterior tibial artery.  A 5 mm diameter by 22 cm length Lutonix drug-coated angioplasty balloon was then inflated within the previously placed stents.  Following this, the tibioperoneal trunk and proximal posterior tibial artery markedly improved with less than 20% residual stenosis, but there was significant spasm in the more distal posterior tibial artery.  However, within the stents there was significant areas of greater than 50% residual stenosis.  Interestingly, the stents were widely patent and the most proximal portion which was a  noncovered stent down to a large medial collateral, then there became significant disease over the next 8 to 10 cm.  A 6 mm diameter by 15 cm length Viabahn stent was then deployed in this location in the distal SFA and above-knee popliteal artery and postdilated with a 5 mm balloon with excellent angiographic completion result and less than 10% residual stenosis. I elected to terminate the procedure. The sheath was removed and StarClose closure device was deployed in the right femoral artery with excellent hemostatic result. The patient was taken to the recovery room in stable condition having tolerated the procedure well.  We are planning on femoral endarterectomies later this week to improve his inflow and can consider repeat left lower extremity angiogram based on the clinical situation  Findings:               Aortogram: Normal renal arteries, normal aorta and iliac arteries including the stented right external iliac artery all being widely patent             Left lower Extremity: The common femoral artery and profunda femoris artery were heavily diseased with  calcific stenosis greater than 80%.  The proximal to mid SFA were patent, but there was an abrupt occlusion within the most recently placed life stent in the mid SFA with reconstitution of posterior tibial artery distally as the best runoff to the foot.   Disposition: Patient was taken to the recovery room in stable condition having tolerated the procedure well.  Complications: None  [name] 09/28/2020 1:28 PM   This note was created with Dragon Medical transcription system. Any errors in dictation are purely unintentional.

## 2020-09-28 NOTE — Interval H&P Note (Signed)
History and Physical Interval Note:  09/28/2020 11:27 AM  [name]  has presented today for surgery, with the diagnosis of LLE Angio   BARD    ASO w rest pain.  The various methods of treatment have been discussed with the patient and family. After consideration of risks, benefits and other options for treatment, the patient has consented to  Procedure(s): LOWER EXTREMITY ANGIOGRAPHY (Left) as a surgical intervention.  The patient's history has been reviewed, patient examined, no change in status, stable for surgery.  I have reviewed the patient's chart and labs.  Questions were answered to the patient's satisfaction.     [name]

## 2020-09-28 NOTE — Progress Notes (Signed)
Blood for Type & screen, CBC, PT/INR drawn. Nasal swab bilat. Done for COVID test secondary to pt. To be admitted for surgery on Wed. 09/30/20. Pt. Tolerated well. Right groin without complications: PAD intact. Pt. Denies any c/o groin pain, dizziness, leg pain, N/V, HA. Await bed for admission.

## 2020-09-29 ENCOUNTER — Encounter: Payer: Self-pay | Admitting: Vascular Surgery

## 2020-09-29 ENCOUNTER — Other Ambulatory Visit (INDEPENDENT_AMBULATORY_CARE_PROVIDER_SITE_OTHER): Payer: Self-pay | Admitting: Vascular Surgery

## 2020-09-29 DIAGNOSIS — I70222 Atherosclerosis of native arteries of extremities with rest pain, left leg: Secondary | ICD-10-CM

## 2020-09-29 DIAGNOSIS — I70229 Atherosclerosis of native arteries of extremities with rest pain, unspecified extremity: Secondary | ICD-10-CM

## 2020-09-29 LAB — BASIC METABOLIC PANEL
Anion gap: 9 (ref 5–15)
BUN: 26 mg/dL — ABNORMAL HIGH (ref 8–23)
CO2: 25 mmol/L (ref 22–32)
Calcium: 8.8 mg/dL — ABNORMAL LOW (ref 8.9–10.3)
Chloride: 104 mmol/L (ref 98–111)
Creatinine, Ser: 0.88 mg/dL (ref 0.61–1.24)
GFR, Estimated: >60 mL/min (ref >60)
Glucose, Bld: 148 mg/dL — ABNORMAL HIGH (ref 70–99)
Potassium: 4.6 mmol/L (ref 3.5–5.1)
Sodium: 138 mmol/L (ref 135–145)

## 2020-09-29 LAB — CBC
HCT: 40.9 % (ref 39.0–52.0)
Hemoglobin: 13.6 g/dL (ref 13.0–17.0)
MCH: 31.6 pg (ref 26.0–34.0)
MCHC: 33.3 g/dL (ref 30.0–36.0)
MCV: 95.1 fL (ref 80.0–100.0)
Platelets: 182 10*3/uL (ref 150–400)
RBC: 4.3 MIL/uL (ref 4.22–5.81)
RDW: 13.7 % (ref 11.5–15.5)
WBC: 10.6 10*3/uL — ABNORMAL HIGH (ref 4.0–10.5)
nRBC: 0 % (ref 0.0–0.2)

## 2020-09-29 LAB — HEPARIN INDUCED PLATELET AB (HIT ANTIBODY): Heparin Induced Plt Ab: 0.069 OD (ref 0.000–0.400)

## 2020-09-29 MED ORDER — METHYLPREDNISOLONE SODIUM SUCC 125 MG IJ SOLR: 125.0000 mg | Freq: Once | INTRAMUSCULAR | Status: DC | PRN

## 2020-09-29 MED ORDER — DIPHENHYDRAMINE HCL 50 MG/ML IJ SOLN: 50.0000 mg | Freq: Once | INTRAMUSCULAR | Status: DC | PRN

## 2020-09-29 MED ORDER — MORPHINE SULFATE (PF) 2 MG/ML IV SOLN
2.0000 mg | INTRAVENOUS | Status: DC | PRN
Start: 2020-09-29 — End: 2020-10-02
  Administered 2020-10-01 – 2020-10-02 (×4): 2 mg via INTRAVENOUS
  Filled 2020-09-29 (×5): qty 1

## 2020-09-29 MED ORDER — HEPARIN (PORCINE) 25000 UT/250ML-% IV SOLN
800.0000 [IU]/h | INTRAVENOUS | Status: DC
  Administered 2020-09-29: 800 [IU]/h via INTRAVENOUS
  Filled 2020-09-29: qty 250

## 2020-09-29 MED ORDER — CEFAZOLIN SODIUM-DEXTROSE 2-4 GM/100ML-% IV SOLN
2.0000 g | INTRAVENOUS | Status: AC
  Administered 2020-09-30: 2 g via INTRAVENOUS
  Filled 2020-09-29: qty 100

## 2020-09-29 MED ORDER — SODIUM CHLORIDE 0.9 % IV SOLN: INTRAVENOUS | Status: DC

## 2020-09-29 MED ORDER — FAMOTIDINE 20 MG PO TABS: 40.0000 mg | ORAL_TABLET | Freq: Once | ORAL | Status: DC | PRN

## 2020-09-29 MED ORDER — OXYCODONE HCL 5 MG PO TABS
5.0000 mg | ORAL_TABLET | ORAL | Status: DC | PRN
  Administered 2020-09-29: 5 mg via ORAL
  Administered 2020-09-30: 10 mg via ORAL
  Administered 2020-10-01: 5 mg via ORAL
  Administered 2020-10-01: 10 mg via ORAL
  Administered 2020-10-01 (×3): 5 mg via ORAL
  Administered 2020-10-02: 10 mg via ORAL
  Filled 2020-09-29 (×4): qty 1
  Filled 2020-09-29 (×2): qty 2
  Filled 2020-09-29: qty 1
  Filled 2020-09-29: qty 2

## 2020-09-29 NOTE — Progress Notes (Addendum)
ANTICOAGULATION CONSULT NOTE  Pharmacy Consult for Heparin infusion Indication:  PAD  Allergies  Allergen Reactions   Brilinta [Ticagrelor] Shortness Of Breath   Chlorhexidine Gluconate Other (See Comments)    Skin burning for hours afterward   Contrast Media [Iodinated Diagnostic Agents] Itching    Face and head flushing, nose itching after contrast administation for angiogram   Statins Other (See Comments)    Failed Crestor 5 mg twice weekly, Crestor 20 mg daily, Pravastatin 40 mg qd, Lipitor, Zocor - muscle aches   Zetia [Ezetimibe] Other (See Comments)    Muscle aches    Patient Measurements: Height: '5\' 6"'$  (167.6 cm) Weight: 81.6 kg (180 lb) IBW/kg (Calculated) : 63.8  Vital Signs: Temp: 97.8 F (36.6 C) (08/02 0748) Temp Source: Oral (08/02 0748) BP: 119/77 (08/02 0748) Pulse Rate: 64 (08/02 0748)  Labs: Recent Labs    09/28/20 1202 09/28/20 1340 09/29/20 0620  HGB  --  13.6 13.6  HCT  --  39.9 40.9  PLT  --  141* 182  LABPROT  --  14.3  --   INR  --  1.1  --   CREATININE 0.99 0.90 0.88    Estimated Creatinine Clearance: 78.3 mL/min (by C-G formula based on SCr of 0.88 mg/dL).   Medical History: Past Medical History:  Diagnosis Date   Abnormal nuclear cardiac imaging test 08/08/2015   Arthritis    fingers   Carotid artery occlusion    CHF (congestive heart failure) (HCC)    Coronary atherosclerosis of native coronary artery 01/29/2013   11/05/19 R/LHC 80% dLMCA stenosis small diffusely dz dLAD, chronically occluded OM1 50%mRCA lesion, widely patent mLCx strent, moderately elevated L heart filling pressures, mild to moderate RH filling pressures, normal to moderately reduced CO   Duodenal erosion    Encounter for screening for lung cancer 07/13/2016   Esophageal stenosis    esophageal dilation   GERD (gastroesophageal reflux disease)    H. pylori infection    Heart attack ([place]) Oct. 2009   Mild   Hiatal hernia    Hyperlipidemia    Hypertension     Old myocardial infarction 11/29/2007   Mildly elevated troponin, isolated value in October 2009. Cardiac catheterization-nonobstructive 60% RCA disease-subsequent nuclear stress test-9 minutes, low risk, mild inferior wall hypokinesis    Pain in limb 12/19/2017   Peripheral vascular disease ([place])    Unstable angina ([place]) 11/25/2017    Medications:  Eliquis 5 mg BID - last dose on 8/1  Assessment: 70 year old male with recurrent ischemia of the left lower extremity s/p LLE angiogram with intervention. Per vascular, pt still with significant disease and will require bilateral femoral endarterectomies with possible LLE intervention planned for 8/3. Pharmacy has been consulted to start heparin at a fixed rate per Dr. Lucky Cowboy.     Plan:  Start heparin infusion at 800 units/hr and no monitoring/titration needed per Dr. Lucky Cowboy. Continue to monitor H&H and platelets   Marielys Trinidad O Guilherme Schwenke 09/29/2020,12:37 PM

## 2020-09-29 NOTE — Plan of Care (Signed)
Pt reports he slept well overnight. Mild pain to right groin, tramadol given with good relief. P.A.D to right groin intact, no hematoma felt. Pedal pulses strong with doppler.   Problem: Clinical Measurements: Goal: Ability to maintain clinical measurements within normal limits will improve Outcome: Progressing Goal: Will remain free from infection Outcome: Progressing Goal: Cardiovascular complication will be avoided Outcome: Progressing   Problem: Elimination: Goal: Will not experience complications related to urinary retention Outcome: Progressing   Problem: Pain Managment: Goal: General experience of comfort will improve Outcome: Progressing   Problem: Safety: Goal: Ability to remain free from injury will improve Outcome: Progressing

## 2020-09-29 NOTE — Progress Notes (Signed)
[facility] Daily Progress Note  09/28/20:             1.  Ultrasound guidance for vascular access right femoral artery             2.  Catheter placement into left common femoral artery from right femoral approach             3.  Aortogram and selective left lower extremity angiogram             4.  Catheter directed thrombolytic therapy with 6 mg of tPA in the left tibioperoneal trunk popliteal, superficial femoral arteries             5.  Mechanical thrombectomy with the penumbra CAT 6 catheter the left SFA, popliteal artery, tibioperoneal trunk, and posterior tibial artery             6.  Percutaneous transluminal angioplasty of left tibioperoneal trunk and most proximal posterior tibial artery with 4 mm diameter by 4 cm length Lutonix drug-coated angioplasty balloon             7.  Percutaneous transluminal angioplasty of the left SFA and popliteal arteries with 5 mm 22 cm length Lutonix drug-coated angioplasty balloon             8.  Viabahn stent placement with 6 mm diameter by 15 cm length stent to the left SFA and above-knee popliteal arteries             9.  StarClose closure device right femoral artery  Subjective: Patient resting comfortable in bed. Notes improvement in discomfort to left lower extremity. No acute issues overnight.   Objective: Vitals:   09/28/20 2035 09/29/20 0139 09/29/20 0409 09/29/20 0748  BP: 118/71 124/62 121/68 119/77  Pulse: 65 64 64 64  Resp: '18 16 18 20  '$ Temp: 98.2 F (36.8 C) (!) 97.4 F (36.3 C) 97.6 F (36.4 C) 97.8 F (36.6 C)  TempSrc: Oral Oral Oral Oral  SpO2: 98% 93% 95% 95%  Weight:      Height:        Intake/Output Summary (Last 24 hours) at 09/29/2020 1133 Last data filed at 09/29/2020 1020 Gross per 24 hour  Intake 1198.33 ml  Output 200 ml  Net 998.33 ml   Physical Exam: A&Ox3, NAD CV: RRR Pulmonary: CTA Bilaterally Abdomen: Soft, Non-tender, Non-distended Right Groin:  PAD in place. No swelling or  drainage noted.  Vascular:  LLE: Thigh soft. Calf. Soft. Extremities warm distally. Hard to palpate pedal pulses.   Laboratory: CBC    Component Value Date/Time   WBC 10.6 (H) 09/29/2020 0620   HGB 13.6 09/29/2020 0620   HGB 11.9 (L) 12/04/2019 1022   HCT 40.9 09/29/2020 0620   HCT 35.3 (L) 12/04/2019 1022   PLT 182 09/29/2020 0620   PLT 326 12/04/2019 1022   BMET    Component Value Date/Time   NA 138 09/29/2020 0620   NA 141 06/29/2020 0906   K 4.6 09/29/2020 0620   CL 104 09/29/2020 0620   CO2 25 09/29/2020 0620   GLUCOSE 148 (H) 09/29/2020 0620   BUN 26 (H) 09/29/2020 0620   BUN 42 (H) 06/29/2020 0906   CREATININE 0.88 09/29/2020 0620   CREATININE 1.12 07/23/2015 1016   CALCIUM 8.8 (L) 09/29/2020 0620   GFRNONAA >60 09/29/2020 0620   GFRAA 85 04/22/2020 1013   Assessment/Planning: Patient is a 70 year old male with recurrent ischemia of  the left lower extremity s/p LLE angiogram with intervention - POD#1  1) Patient still with significant disease - will require bilateral femoral endarterectomies with possible LLE intervention. Plan for tomorrow. 2) Procedure, risks and benefits explained to the patient. All questions answered. Patient wishes to proceed.  3) OK to remove PAD 4) NPO after midnight 5) Heparin to stop 6 hours before surgery.  Discussed with Dr. Ellis Parents Apryll Hinkle PA-C 09/29/2020 11:33 AM

## 2020-09-29 NOTE — H&P (View-Only) (Signed)
[facility] Daily Progress Note  09/28/20:             1.  Ultrasound guidance for vascular access right femoral artery             2.  Catheter placement into left common femoral artery from right femoral approach             3.  Aortogram and selective left lower extremity angiogram             4.  Catheter directed thrombolytic therapy with 6 mg of tPA in the left tibioperoneal trunk popliteal, superficial femoral arteries             5.  Mechanical thrombectomy with the penumbra CAT 6 catheter the left SFA, popliteal artery, tibioperoneal trunk, and posterior tibial artery             6.  Percutaneous transluminal angioplasty of left tibioperoneal trunk and most proximal posterior tibial artery with 4 mm diameter by 4 cm length Lutonix drug-coated angioplasty balloon             7.  Percutaneous transluminal angioplasty of the left SFA and popliteal arteries with 5 mm 22 cm length Lutonix drug-coated angioplasty balloon             8.  Viabahn stent placement with 6 mm diameter by 15 cm length stent to the left SFA and above-knee popliteal arteries             9.  StarClose closure device right femoral artery  Subjective: Patient resting comfortable in bed. Notes improvement in discomfort to left lower extremity. No acute issues overnight.   Objective: Vitals:   09/28/20 2035 09/29/20 0139 09/29/20 0409 09/29/20 0748  BP: 118/71 124/62 121/68 119/77  Pulse: 65 64 64 64  Resp: '18 16 18 20  '$ Temp: 98.2 F (36.8 C) (!) 97.4 F (36.3 C) 97.6 F (36.4 C) 97.8 F (36.6 C)  TempSrc: Oral Oral Oral Oral  SpO2: 98% 93% 95% 95%  Weight:      Height:        Intake/Output Summary (Last 24 hours) at 09/29/2020 1133 Last data filed at 09/29/2020 1020 Gross per 24 hour  Intake 1198.33 ml  Output 200 ml  Net 998.33 ml   Physical Exam: A&Ox3, NAD CV: RRR Pulmonary: CTA Bilaterally Abdomen: Soft, Non-tender, Non-distended Right Groin:  PAD in place. No swelling or  drainage noted.  Vascular:  LLE: Thigh soft. Calf. Soft. Extremities warm distally. Hard to palpate pedal pulses.   Laboratory: CBC    Component Value Date/Time   WBC 10.6 (H) 09/29/2020 0620   HGB 13.6 09/29/2020 0620   HGB 11.9 (L) 12/04/2019 1022   HCT 40.9 09/29/2020 0620   HCT 35.3 (L) 12/04/2019 1022   PLT 182 09/29/2020 0620   PLT 326 12/04/2019 1022   BMET    Component Value Date/Time   NA 138 09/29/2020 0620   NA 141 06/29/2020 0906   K 4.6 09/29/2020 0620   CL 104 09/29/2020 0620   CO2 25 09/29/2020 0620   GLUCOSE 148 (H) 09/29/2020 0620   BUN 26 (H) 09/29/2020 0620   BUN 42 (H) 06/29/2020 0906   CREATININE 0.88 09/29/2020 0620   CREATININE 1.12 07/23/2015 1016   CALCIUM 8.8 (L) 09/29/2020 0620   GFRNONAA >60 09/29/2020 0620   GFRAA 85 04/22/2020 1013   Assessment/Planning: Patient is a 70 year old male with recurrent ischemia of  the left lower extremity s/p LLE angiogram with intervention - POD#1  1) Patient still with significant disease - will require bilateral femoral endarterectomies with possible LLE intervention. Plan for tomorrow. 2) Procedure, risks and benefits explained to the patient. All questions answered. Patient wishes to proceed.  3) OK to remove PAD 4) NPO after midnight 5) Heparin to stop 6 hours before surgery.  Discussed with Dr. Ellis Parents Treston Coker PA-C 09/29/2020 11:33 AM

## 2020-09-30 ENCOUNTER — Inpatient Hospital Stay: Payer: PPO

## 2020-09-30 ENCOUNTER — Inpatient Hospital Stay: Admission: RE | Admit: 2020-09-30 | Payer: PPO | Source: Home / Self Care | Admitting: Vascular Surgery

## 2020-09-30 ENCOUNTER — Encounter
Admission: RE | Disposition: A | Discharge status: Home / Self Care | Payer: Self-pay | Source: Home / Self Care | Attending: Vascular Surgery

## 2020-09-30 DIAGNOSIS — I70223 Atherosclerosis of native arteries of extremities with rest pain, bilateral legs: Secondary | ICD-10-CM | POA: Diagnosis not present

## 2020-09-30 HISTORY — PX: ENDARTERECTOMY FEMORAL: SHX5804

## 2020-09-30 LAB — CBC
HCT: 36.9 % — ABNORMAL LOW (ref 39.0–52.0)
HCT: 38.4 % — ABNORMAL LOW (ref 39.0–52.0)
Hemoglobin: 12.4 g/dL — ABNORMAL LOW (ref 13.0–17.0)
Hemoglobin: 12.7 g/dL — ABNORMAL LOW (ref 13.0–17.0)
MCH: 33.1 pg (ref 26.0–34.0)
MCH: 32.2 pg (ref 26.0–34.0)
MCHC: 33.6 g/dL (ref 30.0–36.0)
MCHC: 33.1 g/dL (ref 30.0–36.0)
MCV: 98.4 fL (ref 80.0–100.0)
MCV: 97.2 fL (ref 80.0–100.0)
Platelets: 130 10*3/uL — ABNORMAL LOW (ref 150–400)
Platelets: 159 10*3/uL (ref 150–400)
RBC: 3.75 MIL/uL — ABNORMAL LOW (ref 4.22–5.81)
RBC: 3.95 MIL/uL — ABNORMAL LOW (ref 4.22–5.81)
RDW: 14 % (ref 11.5–15.5)
RDW: 13.9 % (ref 11.5–15.5)
WBC: 7 10*3/uL (ref 4.0–10.5)
WBC: 8.1 10*3/uL (ref 4.0–10.5)
nRBC: 0 % (ref 0.0–0.2)
nRBC: 0 % (ref 0.0–0.2)

## 2020-09-30 LAB — GLUCOSE, CAPILLARY: Glucose-Capillary: 119 mg/dL — ABNORMAL HIGH (ref 70–99)

## 2020-09-30 LAB — POCT I-STAT, CHEM 8
BUN: 24 mg/dL — ABNORMAL HIGH (ref 8–23)
Calcium, Ion: 1.24 mmol/L (ref 1.15–1.40)
Chloride: 103 mmol/L (ref 98–111)
Creatinine, Ser: 1.1 mg/dL (ref 0.61–1.24)
Glucose, Bld: 139 mg/dL — ABNORMAL HIGH (ref 70–99)
HCT: 36 % — ABNORMAL LOW (ref 39.0–52.0)
Hemoglobin: 12.2 g/dL — ABNORMAL LOW (ref 13.0–17.0)
Potassium: 4.6 mmol/L (ref 3.5–5.1)
Sodium: 139 mmol/L (ref 135–145)
TCO2: 26 mmol/L (ref 22–32)

## 2020-09-30 LAB — MAGNESIUM: Magnesium: 1.9 mg/dL (ref 1.7–2.4)

## 2020-09-30 LAB — MRSA NEXT GEN BY PCR, NASAL: MRSA by PCR Next Gen: NOT DETECTED

## 2020-09-30 LAB — BASIC METABOLIC PANEL
Anion gap: 5 (ref 5–15)
BUN: 28 mg/dL — ABNORMAL HIGH (ref 8–23)
CO2: 26 mmol/L (ref 22–32)
Calcium: 8.7 mg/dL — ABNORMAL LOW (ref 8.9–10.3)
Chloride: 105 mmol/L (ref 98–111)
Creatinine, Ser: 0.9 mg/dL (ref 0.61–1.24)
GFR, Estimated: >60 mL/min (ref >60)
Glucose, Bld: 101 mg/dL — ABNORMAL HIGH (ref 70–99)
Potassium: 4.2 mmol/L (ref 3.5–5.1)
Sodium: 136 mmol/L (ref 135–145)

## 2020-09-30 LAB — PROTIME-INR
INR: 1 (ref 0.8–1.2)
Prothrombin Time: 13.3 seconds (ref 11.4–15.2)

## 2020-09-30 LAB — APTT: aPTT: 33 seconds (ref 24–36)

## 2020-09-30 SURGERY — ENDARTERECTOMY, FEMORAL
Anesthesia: General

## 2020-09-30 MED ORDER — PROMETHAZINE HCL 25 MG/ML IJ SOLN: 12.5000 mg | Freq: Once | INTRAMUSCULAR | Status: DC | PRN

## 2020-09-30 MED ORDER — DIPHENHYDRAMINE HCL 50 MG/ML IJ SOLN
INTRAMUSCULAR | Status: AC
  Filled 2020-09-30: qty 1

## 2020-09-30 MED ORDER — ROCURONIUM BROMIDE 100 MG/10ML IV SOLN
INTRAVENOUS | Status: DC | PRN
  Administered 2020-09-30: 30 mg via INTRAVENOUS
  Administered 2020-09-30: 20 mg via INTRAVENOUS
  Administered 2020-09-30 (×2): 30 mg via INTRAVENOUS
  Administered 2020-09-30: 20 mg via INTRAVENOUS
  Administered 2020-09-30: 50 mg via INTRAVENOUS

## 2020-09-30 MED ORDER — HEPARIN SODIUM (PORCINE) 1000 UNIT/ML IJ SOLN
INTRAMUSCULAR | Status: DC | PRN
  Administered 2020-09-30: 6000 [IU] via INTRAVENOUS

## 2020-09-30 MED ORDER — OXYCODONE HCL 5 MG/5ML PO SOLN: 5.0000 mg | Freq: Once | ORAL | Status: DC | PRN

## 2020-09-30 MED ORDER — METHYLPREDNISOLONE SODIUM SUCC 125 MG IJ SOLR
INTRAMUSCULAR | Status: AC
  Filled 2020-09-30: qty 2

## 2020-09-30 MED ORDER — LACTATED RINGERS IV SOLN: INTRAVENOUS | Status: DC

## 2020-09-30 MED ORDER — APIXABAN 5 MG PO TABS
5.0000 mg | ORAL_TABLET | Freq: Two times a day (BID) | ORAL | Status: DC
  Administered 2020-10-01 – 2020-10-02 (×3): 5 mg via ORAL
  Filled 2020-09-30 (×3): qty 1

## 2020-09-30 MED ORDER — SODIUM CHLORIDE 0.9 % IV SOLN
INTRAVENOUS | Status: DC | PRN
  Administered 2020-09-30: 60 ug/min via INTRAVENOUS

## 2020-09-30 MED ORDER — SODIUM CHLORIDE FLUSH 0.9 % IV SOLN
INTRAVENOUS | Status: AC
  Filled 2020-09-30: qty 40

## 2020-09-30 MED ORDER — HEPARIN SODIUM (PORCINE) 1000 UNIT/ML IJ SOLN
INTRAMUSCULAR | Status: AC
  Filled 2020-09-30: qty 1

## 2020-09-30 MED ORDER — CEFAZOLIN SODIUM-DEXTROSE 2-4 GM/100ML-% IV SOLN
INTRAVENOUS | Status: AC
  Filled 2020-09-30: qty 100

## 2020-09-30 MED ORDER — HYDROMORPHONE HCL 1 MG/ML IJ SOLN
INTRAMUSCULAR | Status: AC
  Administered 2020-09-30: 0.25 mg via INTRAVENOUS
  Filled 2020-09-30: qty 1

## 2020-09-30 MED ORDER — PHENYLEPHRINE HCL (PRESSORS) 10 MG/ML IV SOLN
INTRAVENOUS | Status: AC
  Filled 2020-09-30: qty 1

## 2020-09-30 MED ORDER — FENTANYL CITRATE (PF) 100 MCG/2ML IJ SOLN
INTRAMUSCULAR | Status: AC
  Filled 2020-09-30: qty 2

## 2020-09-30 MED ORDER — SUGAMMADEX SODIUM 200 MG/2ML IV SOLN
INTRAVENOUS | Status: DC | PRN
  Administered 2020-09-30: 200 mg via INTRAVENOUS

## 2020-09-30 MED ORDER — SODIUM CHLORIDE 0.9 % IV SOLN
INTRAVENOUS | Status: DC | PRN
  Administered 2020-09-30: 1005 mL via INTRAMUSCULAR

## 2020-09-30 MED ORDER — HEMOSTATIC AGENTS (NO CHARGE) OPTIME
TOPICAL | Status: DC | PRN
  Administered 2020-09-30: 2 via TOPICAL

## 2020-09-30 MED ORDER — DEXAMETHASONE SODIUM PHOSPHATE 10 MG/ML IJ SOLN
INTRAMUSCULAR | Status: AC
  Filled 2020-09-30: qty 1

## 2020-09-30 MED ORDER — PHENYLEPHRINE HCL (PRESSORS) 10 MG/ML IV SOLN
INTRAVENOUS | Status: DC | PRN
  Administered 2020-09-30: 50 ug via INTRAVENOUS
  Administered 2020-09-30: 100 ug via INTRAVENOUS
  Administered 2020-09-30: 50 ug via INTRAVENOUS
  Administered 2020-09-30: 100 ug via INTRAVENOUS
  Administered 2020-09-30: 50 ug via INTRAVENOUS
  Administered 2020-09-30: 200 ug via INTRAVENOUS
  Administered 2020-09-30: 50 ug via INTRAVENOUS
  Administered 2020-09-30: 100 ug via INTRAVENOUS

## 2020-09-30 MED ORDER — METHYLPREDNISOLONE SODIUM SUCC 125 MG IJ SOLR
INTRAMUSCULAR | Status: DC | PRN
  Administered 2020-09-30: 125 mg via INTRAVENOUS

## 2020-09-30 MED ORDER — ACETAMINOPHEN 500 MG PO TABS
1000.0000 mg | ORAL_TABLET | Freq: Four times a day (QID) | ORAL | Status: DC | PRN
  Administered 2020-09-30 – 2020-10-01 (×2): 1000 mg via ORAL
  Filled 2020-09-30 (×2): qty 2

## 2020-09-30 MED ORDER — PROPOFOL 10 MG/ML IV BOLUS
INTRAVENOUS | Status: DC | PRN
  Administered 2020-09-30: 50 mg via INTRAVENOUS
  Administered 2020-09-30: 150 mg via INTRAVENOUS

## 2020-09-30 MED ORDER — 0.9 % SODIUM CHLORIDE (POUR BTL) OPTIME
TOPICAL | Status: DC | PRN
  Administered 2020-09-30: 500 mL

## 2020-09-30 MED ORDER — OXYCODONE HCL 5 MG PO TABS: 5.0000 mg | ORAL_TABLET | Freq: Once | ORAL | Status: DC | PRN

## 2020-09-30 MED ORDER — VISTASEAL 10 ML SINGLE DOSE KIT
PACK | CUTANEOUS | Status: DC | PRN
  Administered 2020-09-30: 10 mL via TOPICAL

## 2020-09-30 MED ORDER — SODIUM CHLORIDE (PF) 0.9 % IJ SOLN
INTRAMUSCULAR | Status: DC | PRN
  Administered 2020-09-30: 100 mL via INTRAVENOUS

## 2020-09-30 MED ORDER — ONDANSETRON HCL 4 MG/2ML IJ SOLN
INTRAMUSCULAR | Status: DC | PRN
  Administered 2020-09-30: 4 mg via INTRAVENOUS

## 2020-09-30 MED ORDER — HYDROMORPHONE HCL 1 MG/ML IJ SOLN: 0.2500 mg | INTRAMUSCULAR | Status: DC | PRN

## 2020-09-30 MED ORDER — ACETAMINOPHEN 10 MG/ML IV SOLN: 1000.0000 mg | Freq: Once | INTRAVENOUS | Status: DC | PRN

## 2020-09-30 MED ORDER — VISTASEAL 10 ML SINGLE DOSE KIT
PACK | CUTANEOUS | Status: AC
  Filled 2020-09-30: qty 10

## 2020-09-30 MED ORDER — LACTATED RINGERS IV BOLUS
500.0000 mL | Freq: Once | INTRAVENOUS | Status: DC
  Administered 2020-09-30: 500 mL via INTRAVENOUS

## 2020-09-30 MED ORDER — LIDOCAINE HCL (PF) 2 % IJ SOLN
INTRAMUSCULAR | Status: AC
  Filled 2020-09-30: qty 5

## 2020-09-30 MED ORDER — SODIUM CHLORIDE 0.9 % IV SOLN: INTRAVENOUS | Status: AC | PRN

## 2020-09-30 MED ORDER — DEXMEDETOMIDINE (PRECEDEX) IN NS 20 MCG/5ML (4 MCG/ML) IV SYRINGE
PREFILLED_SYRINGE | INTRAVENOUS | Status: DC | PRN
  Administered 2020-09-30 (×2): 4 ug via INTRAVENOUS

## 2020-09-30 MED ORDER — FENTANYL CITRATE (PF) 100 MCG/2ML IJ SOLN
INTRAMUSCULAR | Status: DC | PRN
  Administered 2020-09-30 (×2): 50 ug via INTRAVENOUS

## 2020-09-30 MED ORDER — PROPOFOL 10 MG/ML IV BOLUS
INTRAVENOUS | Status: AC
  Filled 2020-09-30: qty 20

## 2020-09-30 MED ORDER — LACTATED RINGERS IV SOLN: INTRAVENOUS | Status: DC | PRN

## 2020-09-30 MED ORDER — DEXAMETHASONE SODIUM PHOSPHATE 10 MG/ML IJ SOLN
INTRAMUSCULAR | Status: DC | PRN
  Administered 2020-09-30: 5 mg via INTRAVENOUS

## 2020-09-30 MED ORDER — LIDOCAINE HCL (CARDIAC) PF 100 MG/5ML IV SOSY
PREFILLED_SYRINGE | INTRAVENOUS | Status: DC | PRN
  Administered 2020-09-30: 60 mg via INTRAVENOUS

## 2020-09-30 MED ORDER — ROCURONIUM BROMIDE 10 MG/ML (PF) SYRINGE
PREFILLED_SYRINGE | INTRAVENOUS | Status: AC
  Filled 2020-09-30: qty 10

## 2020-09-30 MED ORDER — IODIXANOL 320 MG/ML IV SOLN
INTRAVENOUS | Status: DC | PRN
Start: 2020-09-30 — End: 2020-09-30
  Administered 2020-09-30: 25 mL via INTRA_ARTERIAL

## 2020-09-30 MED ORDER — DIPHENHYDRAMINE HCL 50 MG/ML IJ SOLN
INTRAMUSCULAR | Status: DC | PRN
  Administered 2020-09-30: 50 mg via INTRAVENOUS

## 2020-09-30 SURGICAL SUPPLY — 74 items
"PENCIL ELECTRO HAND CTR " (MISCELLANEOUS) IMPLANT
ADH SKN CLS APL DERMABOND .7 (GAUZE/BANDAGES/DRESSINGS) ×6
APPLIER CLIP 11 MED OPEN (CLIP)
APPLIER CLIP 9.375 SM OPEN (CLIP)
APR CLP MED 11 20 MLT OPN (CLIP)
APR CLP SM 9.3 20 MLT OPN (CLIP)
BAG DECANTER FOR FLEXI CONT (MISCELLANEOUS) ×4 IMPLANT
BAG ISL LRG 20X20 DRWSTRG (DRAPES)
BAG ISOLATATION DRAPE 20X20 ST (DRAPES) IMPLANT
BLADE SURG 15 STRL LF DISP TIS (BLADE) ×3 IMPLANT
BLADE SURG 15 STRL SS (BLADE) ×4
BLADE SURG SZ11 CARB STEEL (BLADE) ×4 IMPLANT
BOOT SUTURE AID YELLOW STND (SUTURE) ×4 IMPLANT
CLIP APPLIE 11 MED OPEN (CLIP) IMPLANT
CLIP APPLIE 9.375 SM OPEN (CLIP) IMPLANT
DERMABOND ADVANCED (GAUZE/BANDAGES/DRESSINGS) ×2
DERMABOND ADVANCED .7 DNX12 (GAUZE/BANDAGES/DRESSINGS) ×4 IMPLANT
DRAPE INCISE IOBAN 66X45 STRL (DRAPES) ×4 IMPLANT
DRAPE ISOLATE BAG 20X20 STRL (DRAPES)
DRESSING SURGICEL FIBRLLR 1X2 (HEMOSTASIS) ×2 IMPLANT
DRSG OPSITE POSTOP 4X6 (GAUZE/BANDAGES/DRESSINGS) ×4 IMPLANT
DRSG SURGICEL FIBRILLAR 1X2 (HEMOSTASIS) ×8
ELECT CAUTERY BLADE 6.4 (BLADE) ×6 IMPLANT
ELECT REM PT RETURN 9FT ADLT (ELECTROSURGICAL) ×4
ELECTRODE REM PT RTRN 9FT ADLT (ELECTROSURGICAL) ×3 IMPLANT
GAUZE 4X4 16PLY ~~LOC~~+RFID DBL (SPONGE) ×4 IMPLANT
GLOVE SURG SYN 7.0 (GLOVE) ×8 IMPLANT
GLOVE SURG SYN 7.0 PF PI (GLOVE) ×4 IMPLANT
GLOVE SURG UNDER LTX SZ7.5 (GLOVE) ×4 IMPLANT
GOWN STRL REUS W/ TWL LRG LVL3 (GOWN DISPOSABLE) ×3 IMPLANT
GOWN STRL REUS W/ TWL XL LVL3 (GOWN DISPOSABLE) ×6 IMPLANT
GOWN STRL REUS W/TWL LRG LVL3 (GOWN DISPOSABLE) ×4
GOWN STRL REUS W/TWL XL LVL3 (GOWN DISPOSABLE) ×8
HEMOSTAT SURGICEL 2X3 (HEMOSTASIS) ×4 IMPLANT
IV NS 500ML (IV SOLUTION) ×4
IV NS 500ML BAXH (IV SOLUTION) ×3 IMPLANT
KIT MICROPUNCTURE NIT STIFF (SHEATH) ×2 IMPLANT
KIT TURNOVER KIT A (KITS) ×4 IMPLANT
LABEL OR SOLS (LABEL) ×4 IMPLANT
LOOP RED MAXI  1X406MM (MISCELLANEOUS) ×4
LOOP VESSEL MAXI  1X406 RED (MISCELLANEOUS) ×12
LOOP VESSEL MAXI 1X406 RED (MISCELLANEOUS) ×8 IMPLANT
LOOP VESSEL MINI 0.8X406 BLUE (MISCELLANEOUS) ×8 IMPLANT
LOOPS BLUE MINI 0.8X406MM (MISCELLANEOUS) ×4
MANIFOLD NEPTUNE II (INSTRUMENTS) ×4 IMPLANT
NDL SAFETY ECLIPSE 18X1.5 (NEEDLE) ×3 IMPLANT
NEEDLE HYPO 18GX1.5 SHARP (NEEDLE) ×4
NS IRRIG 500ML POUR BTL (IV SOLUTION) ×4 IMPLANT
PACK BASIN MAJOR ARMC (MISCELLANEOUS) ×4 IMPLANT
PACK UNIVERSAL (MISCELLANEOUS) ×4 IMPLANT
PATCH CAROTID ECM VASC 1X10 (Prosthesis & Implant Heart) ×4 IMPLANT
PENCIL ELECTRO HAND CTR (MISCELLANEOUS) IMPLANT
SET WALTER ACTIVATION W/DRAPE (SET/KITS/TRAYS/PACK) ×6 IMPLANT
SPONGE T-LAP 18X18 ~~LOC~~+RFID (SPONGE) ×8 IMPLANT
SUT MNCRL 4-0 (SUTURE) ×8
SUT MNCRL 4-0 27XMFL (SUTURE) ×6
SUT PROLENE 5 0 RB 1 DA (SUTURE) ×8 IMPLANT
SUT PROLENE 6 0 BV (SUTURE) ×16 IMPLANT
SUT PROLENE 7 0 BV 1 (SUTURE) ×16 IMPLANT
SUT SILK 2 0 (SUTURE) ×8
SUT SILK 2-0 18XBRD TIE 12 (SUTURE) ×4 IMPLANT
SUT SILK 3 0 (SUTURE) ×4
SUT SILK 3-0 18XBRD TIE 12 (SUTURE) ×3 IMPLANT
SUT SILK 4 0 (SUTURE) ×4
SUT SILK 4-0 18XBRD TIE 12 (SUTURE) ×3 IMPLANT
SUT VIC AB 2-0 CT1 27 (SUTURE) ×8
SUT VIC AB 2-0 CT1 TAPERPNT 27 (SUTURE) ×6 IMPLANT
SUT VIC AB 3-0 SH 27 (SUTURE) ×4
SUT VIC AB 3-0 SH 27X BRD (SUTURE) ×3 IMPLANT
SUT VICRYL+ 3-0 36IN CT-1 (SUTURE) ×16 IMPLANT
SUTURE MNCRL 4-0 27XMF (SUTURE) ×4 IMPLANT
SYR 20ML LL LF (SYRINGE) ×4 IMPLANT
SYR 5ML LL (SYRINGE) ×4 IMPLANT
TRAY FOLEY MTR SLVR 16FR STAT (SET/KITS/TRAYS/PACK) ×4 IMPLANT

## 2020-09-30 NOTE — Op Note (Signed)
OPERATIVE NOTE   PROCEDURE: 1.   Left common femoral, profunda femoris, and superficial femoral artery endarterectomies 2.   Right common femoral, profunda femoris, and superficial femoral artery endarterectomies 3.   Left lower extremity angiogram   PRE-OPERATIVE DIAGNOSIS: 1.Atherosclerotic occlusive disease bilateral lower extremities with rest pain   POST-OPERATIVE DIAGNOSIS: Same  SURGEON: [name], MD  CO-surgeon:  Hortencia Pilar, MD  ANESTHESIA:  general  ESTIMATED BLOOD LOSS: 200 cc  FINDING(S): 1.  significant plaque in bilateral common femoral, profunda femoris, and superficial femoral arteries  SPECIMEN(S):  Bilateral common femoral, profunda femoris, and superficial femoral artery plaque.  INDICATIONS:    Patient presents with severe peripheral arterial disease.  He has undergone many left lower extremity revascularizations previously and his most recent angiogram had severe bilateral common femoral artery and femoral bifurcation disease.  Bilateral femoral endarterectomies are planned to try to improve perfusion.  The risks and benefits as well as alternative therapies including intervention were reviewed in detail all questions were answered the patient agrees to proceed with surgery.  DESCRIPTION: After obtaining full informed written consent, the patient was brought back to the operating room and placed supine upon the operating table.  The patient received IV antibiotics prior to induction.  After obtaining adequate anesthesia, the patient was prepped and draped in the standard fashion appropriate time out is called.    With myself working on the left and Dr. Delana Meyer working on the right we began by dissecting out the femoral arteries on each side. Vertical incisions were created overlying both femoral arteries. The common femoral artery proximally, and superficial femoral artery, and primary profunda femoris artery branches were encircled with vessel loops and  prepared for control. Both femoral arteries were found to have significant plaque from the common femoral artery into the profunda and superficial femoral arteries.   6000 units of heparin was given and allowed circulate for 5 minutes.   Attention is then turned to the right femoral artery.  An arteriotomy is made with 11 blade and extended with Potts scissors in the common femoral artery and carried down onto the first 3 cm of the superficial femoral artery. An endarterectomy was then performed. The [facility] was used to create a plane. The proximal endpoint was cut flush with tenotomy scissors. This was in the proximal common femoral artery. An extensive eversion endarterectomy was then performed for the first 3-4 cm of the profunda femoris artery beyond the primary trenches. Good backbleeding was then seen. The distal endpoint of the superficial femoral artery endarterectomy was created with gentle traction and the distal endpoint was quite clean and the superficial femoral artery.  The Cormatrix patcth is then selected and prepared for a patch angioplasty.  It is cut and beveled and started at the proximal endpoint with a 6-0 Prolene suture.  Approximately one half of the suture line is run medially and laterally and the distal end point was cut and bevelled to match the arteriotomy.  A second 6-0 Prolene was started at the distal end point and run to the mid portion to complete the arteriotomy.  The vessel was flushed prior to release of control and completion of the anastomosis.  At this point, flow was established first to the profunda femoris artery and then to the superficial femoral artery. Easily palpable pulses are noted well beyond the anastomosis in both arteries. Prior to performing the endarterectomy in the left femoral artery, we elected to image the left lower extremity to ensure the  recent intervention remain patent with the inflow lesion.  A micropuncture needle was used to access  the left superficial femoral artery in the proximal segment and a micropuncture wire and sheath were then placed.  Imaging of the left lower extremities then performed through the micropuncture sheath.  This showed the previously placed left SFA and popliteal stents to be patent with less than 30% residual stenosis.  There was a typical tibial trifurcation with two-vessel runoff distally which was stable from his previous angiogram.  The sheath was then removed and then we turned attention to performing endarterectomy of the left femoral artery. Arteriotomy is made in the common femoral artery and extended down into the first 3 to 4 cm of the left superficial femoral artery beyond the access site for the angiogram.  Similarly, an endarterectomy was performed with the [facility]. The proximal endpoint was cut flush with tenotomy scissors in the proximal common femoral artery.  The proximal portion of the left profunda femoris artery was addressed and treated with an eversion endarterectomy and this was performed with a hemostat and gentle traction. The arteriotomy was carried down onto the superficial femoral artery and the endarterectomy was continued to this point. The distal endpoint was created with gentle traction and then tacked down with a pair of 7-0 Prolene sutures. The Cormatrix extracellular patch was then brought onto the field.  It is cut and beveled and started at the proximal endpoint with a 6-0 Prolene suture.  Approximately one half of the suture line is run medially and laterally and the distal end point was cut and bevelled to match the arteriotomy.  A second 6-0 Prolene was started at the distal end point and run to the mid portion to complete the arteriotomy.   Flushing maneuvers were performed and flow was reestablished to the femoral vessels. Excellent pulses noted in the left superficial femoral and profunda femoris artery below the femoral anastomosis.  Fibrillar and Vistacel topical  hemostatic agents were placed in the femoral incisions and hemostasis was complete. The femoral incisions were then closed in a layered fashion with 2 layers of 2-0 Vicryl, 2 layers of 3-0 Vicryl, and 4-0 Monocryl for the skin closure. Dermabond and sterile dressing were then placed over all incisions.  The patient was then awakened from anesthesia and taken to the recovery room in stable condition having tolerated the procedure well.  COMPLICATIONS: None  CONDITION: Stable     [name] 09/30/2020 6:14 PM  This note was created with Dragon Medical transcription system. Any errors in dictation are purely unintentional.

## 2020-09-30 NOTE — Op Note (Signed)
OPERATIVE NOTE   PROCEDURE: Bilateral common femoral, superficial femoral and profunda femoris endarterectomy with Cormatrix patch angioplasty. Left lower extremity angiography on table  PRE-OPERATIVE DIAGNOSIS: Atherosclerotic occlusive disease bilateral lower extremities with lifestyle limiting claudication and severe rest pain symptoms; hypertension; coronary artery disease.  POST-OPERATIVE DIAGNOSIS: Same  CO-SURGEON: Katha Cabal, MD and Algernon Huxley, M.D.  ASSISTANT(S): None  ANESTHESIA: general  ESTIMATED BLOOD LOSS: 200 cc  FINDING(S): Profound calcific plaque noted bilaterally extending past the initial bifurcation of the profunda femoris arteries as well as down the extensive length of the SFA's bilaterally  SPECIMEN(S):  Calcific plaque from the common femoral, superficial femoral and the profunda femoris arteries bilaterally  INDICATIONS:   [name] 70 y.o. y.o.male who presents with complaints of lifestyle limiting claudication and pain continuously in the feet bilaterally. The patient has documented severe atherosclerotic occlusive disease and has undergone multiple minimally invasive treatments in the past. However, at this point his primary area of stricture stenosis resides in the common femoral and origins of the superficial femoral and profunda femoris extending into these arteries and therefore this is not amenable to intervention and he is now undergoing open endarterectomy. The risks and benefits of been reviewed with the patient, all questions have answered; alternative therapies have been reviewed as well and the patient has agreed to proceed with surgical open repair.  DESCRIPTION: After obtaining full informed written consent, the patient was brought back to the operating room and placed supine upon the operating table.  The patient received IV antibiotics prior to induction.  After obtaining adequate anesthesia, the patient was prepped and draped  in the standard fashion for: bilateral femoral exposure.  Co-surgeons are required because this is a bilateral procedure with work being performed simultaneously from both the right femoral and left femoral approach.  This also expedite the procedure making a shorter operative time reducing complications and improving patient safety.  Attention was turned to the bilateral groin with Dr. Lucky Cowboy working on the left and myself working on the right of the patient.  Vertical  incisions were made over the common femoral artery and dissected down to the common femoral artery with electrocautery.  The left femoral was treated first and then the right femoral was treated in the same fashion.  The  dissection of the common femoral artery was performed from the distal external iliac artery (identified by the superficial circumflex vessels) down to the femoral bifurcation.  On initial inspection, the common femoral artery was: Densely calcified and there was no palpable pulse noted bilaterally.    Subsequently the dissection was continued to include all circumflex branches, and several centimeters of the external iliac artery to allow for appropriate vascular control.  Next the profunda femoris artery and superficial femoral artery were dissected and controlled. The superficial femoral artery was dissected circumferentially for a distance of approximately 5 cm and the profunda femoris was dissected circumferentially out to the third order branches individual vessel loops were placed around each branch. Both of the groins were treated simultaneously as described above. Control of all branches was obtained with vessel loops.  A softer area in the distal external iliac artery amendable to clamping was identified.    The patient was given 5000 units of Heparin intravenously, which was a therapeutic bolus.   After waiting 3 minutes, the distal external iliac artery was clamped and placed all circumflex branches, and the profunda  and superficial femoral arteries under tension.  Right femoral  endarterectomy was performed first and then the left.  Both were performed in same fashion.  Arteriotomy was made in the common femoral artery with a 11-blade and extended it with a Potts scissor proximally and distally extending the distal end down the SFA for approximately 4 cm.   Endarterectomy was then performed under direct visualization using a freer elevator and a right angle from the mid common femoral extending up both proximally and distally. Proximally the endarterectomy was brought up to the level of the clamp where a clean edge was obtained. Distally the endarterectomy was carried down to a soft spot in the SFA where a feathered edge was obtained.  7-0 Prolene interrupted tacking sutures were placed to secure the leading edge of the plaque in the SFA on the left but this was not necessary on the right.  The profunda femoris was treated with an eversion technique extending endarterectomy approximately 2 cm distally again obtaining a featheredge on both sides right and left.  Given the smooth intimal edge tacking sutures were not necessary on either side.  At this point, we fashioned a core matrix patch for the geometry of the arteriotomy.  The patch was sewn to the artery with 2 running stitches of 6-0 Prolene, running from each end.  Prior to completing the patch angioplasty, the profunda femoral artery was flushed as was the superficial femoral artery. The system was then forward flushed. The endarterectomy site was then irrigated copiously with heparinized saline. The patch angioplasty was completed in the usual fashion.  Flow was then reestablished first to the profunda femoris and then the superficial femoral artery. Any gaps or bleeding sites in the suture line were easily controlled with a 6-0 Prolene suture.  It should be noted prior to perform the left sided endarterectomy a micropuncture needle was inserted into the common  femoral on the left wire was advanced followed by micro sheath.  Hand-injection contrast was then performed demonstrating images of the left lower extremity all the way down to the distal tibials.  This demonstrated the recent reconstruction including the Viabahn stent were widely patent with two-vessel runoff.  Both right and left groins were then irrigated copiously with sterile saline and subsequently Evicel and Surgicel were placed in the wound. The incision was repaired with a double layer of 2-0 Vicryl, a double layer of 3-0 Vicryl, and a layer of 4-0 Monocryl in a subcuticular fashion.  The skin was cleaned, dried, and reinforced with Dermabond.  COMPLICATIONS: None  CONDITION: [name], M.D. [facility] Office: [phone]  09/30/2020, 6:28 PM

## 2020-09-30 NOTE — Transfer of Care (Signed)
Immediate Anesthesia Transfer of Care Note  Patient: [name]  Procedure(s) Performed: ENDARTERECTOMY FEMORAL (Bilateral) LOWER EXTREMITY ANGIOGRAM possible SFA stent (Left) APPLICATION OF CELL SAVER  Patient Location: PACU  Anesthesia Type:General  Level of Consciousness: awake and patient cooperative  Airway & Oxygen Therapy: Patient Spontanous Breathing and Patient connected to face mask oxygen  Post-op Assessment: Report given to RN and Post -op Vital signs reviewed and stable  Post vital signs: Reviewed and stable  Last Vitals:  Vitals Value Taken Time  BP 114/67 09/30/20 1835  Temp    Pulse 67 09/30/20 1843  Resp 8 09/30/20 1843  SpO2 92 % 09/30/20 1843  Vitals shown include unvalidated device data.  Last Pain:  Vitals:   09/30/20 1148  TempSrc: Tympanic  PainSc:       Patients Stated Pain Goal: 0 ([id number])  Complications: No notable events documented.

## 2020-09-30 NOTE — Interval H&P Note (Signed)
History and Physical Interval Note:  09/30/2020 1:31 PM  [name]  has presented today for surgery, with the diagnosis of ischemic limb.  The various methods of treatment have been discussed with the patient and family. After consideration of risks, benefits and other options for treatment, the patient has consented to  Procedure(s): ENDARTERECTOMY FEMORAL (Bilateral) LOWER EXTREMITY ANGIOGRAM possible SFA stent (Left) APPLICATION OF CELL SAVER (N/A) as a surgical intervention.  The patient's history has been reviewed, patient examined, no change in status, stable for surgery.  I have reviewed the patient's chart and labs.  Questions were answered to the patient's satisfaction.     [name]

## 2020-09-30 NOTE — Anesthesia Preprocedure Evaluation (Addendum)
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 Left ventricular ejection fraction, by estimation, is 50 to 55%. The  left ventricle has low normal function. The left ventricle has no regional  wall motion abnormalities. The left ventricular internal cavity size was  mildly dilated. Left ventricular  diastolic parameters are indeterminate. The average left ventricular  global longitudinal strain is -14.0 %. The global longitudinal strain is  abnormal.  2. Right ventricular systolic function is normal. The right ventricular  size is normal. Tricuspid regurgitation signal is inadequate for assessing  PA pressure.  3. Left atrial size was mildly dilated.  4. The mitral valve is normal in structure. Mild mitral valve  regurgitation. No evidence of mitral stenosis.  5. The aortic valve is normal in structure. Aortic valve regurgitation is  trivial. Mild aortic valve  sclerosis is present, with no evidence of  aortic valve stenosis.   EKG 06/2020 Atrial sensed, ventricular paced  Reproductive/Obstetrics                            Anesthesia Physical Anesthesia Plan  ASA: 3  Anesthesia Plan: General   Post-op Pain Management:    Induction:   PONV Risk Score and Plan: 3  Airway Management Planned:   Additional Equipment: Arterial line  Intra-op Plan:   Post-operative Plan: Extubation in OR  Informed Consent: I have reviewed the patients History and Physical, chart, labs and discussed the procedure including the risks, benefits and alternatives for the proposed anesthesia with the patient or authorized representative who has indicated his/her understanding and acceptance.       Plan Discussed with:   Anesthesia Plan Comments: (PIV x 2  Pre-induction arterial line  AICD - magnet or deactivate, have Zolls available )       Anesthesia Quick Evaluation

## 2020-09-30 NOTE — Anesthesia Procedure Notes (Signed)
Procedure Name: Intubation Date/Time: 09/30/2020 2:41 PM Performed by: Lily Peer, Alazar Cherian, CRNA Pre-anesthesia Checklist: Patient identified, Emergency Drugs available, Suction available and Patient being monitored Patient Re-evaluated:Patient Re-evaluated prior to induction Oxygen Delivery Method: Circle system utilized Preoxygenation: Pre-oxygenation with 100% oxygen Induction Type: IV induction Ventilation: Mask ventilation without difficulty Laryngoscope Size: McGraph and 3 Grade View: Grade I Tube type: Oral Number of attempts: 1 Airway Equipment and Method: Stylet and Oral airway Placement Confirmation: ETT inserted through vocal cords under direct vision, positive ETCO2 and breath sounds checked- equal and bilateral Secured at: 22 cm Tube secured with: Tape Dental Injury: Teeth and Oropharynx as per pre-operative assessment

## 2020-09-30 NOTE — Progress Notes (Signed)
Pt did not receive CHG wipes due to allergy.

## 2020-09-30 NOTE — Progress Notes (Addendum)
ANTICOAGULATION CONSULT NOTE  Pharmacy Consult for Heparin infusion Indication:  PAD  Allergies  Allergen Reactions   Brilinta [Ticagrelor] Shortness Of Breath   Chlorhexidine Gluconate Other (See Comments)    Skin burning for hours afterward   Contrast Media [Iodinated Diagnostic Agents] Itching    Face and head flushing, nose itching after contrast administation for angiogram   Statins Other (See Comments)    Failed Crestor 5 mg twice weekly, Crestor 20 mg daily, Pravastatin 40 mg qd, Lipitor, Zocor - muscle aches   Zetia [Ezetimibe] Other (See Comments)    Muscle aches    Patient Measurements: Height: '5\' 6"'$  (167.6 cm) Weight: 81.6 kg (180 lb) IBW/kg (Calculated) : 63.8  Vital Signs: Temp: 97.9 F (36.6 C) (08/03 0351) Temp Source: Oral (08/03 0351) BP: 106/67 (08/03 0351) Pulse Rate: 64 (08/03 0351)  Labs: Recent Labs    09/28/20 1340 09/29/20 0620 09/30/20 0520  HGB 13.6 13.6 12.4*  HCT 39.9 40.9 36.9*  PLT 141* 182 130*  APTT  --   --  33  LABPROT 14.3  --  13.3  INR 1.1  --  1.0  CREATININE 0.90 0.88 0.90     Estimated Creatinine Clearance: 76.6 mL/min (by C-G formula based on SCr of 0.9 mg/dL).   Medical History: Past Medical History:  Diagnosis Date   Abnormal nuclear cardiac imaging test 08/08/2015   Arthritis    fingers   Carotid artery occlusion    CHF (congestive heart failure) (HCC)    Coronary atherosclerosis of native coronary artery 01/29/2013   11/05/19 R/LHC 80% dLMCA stenosis small diffusely dz dLAD, chronically occluded OM1 50%mRCA lesion, widely patent mLCx strent, moderately elevated L heart filling pressures, mild to moderate RH filling pressures, normal to moderately reduced CO   Duodenal erosion    Encounter for screening for lung cancer 07/13/2016   Esophageal stenosis    esophageal dilation   GERD (gastroesophageal reflux disease)    H. pylori infection    Heart attack ([place]) Oct. 2009   Mild   Hiatal hernia    Hyperlipidemia     Hypertension    Old myocardial infarction 11/29/2007   Mildly elevated troponin, isolated value in October 2009. Cardiac catheterization-nonobstructive 60% RCA disease-subsequent nuclear stress test-9 minutes, low risk, mild inferior wall hypokinesis    Pain in limb 12/19/2017   Peripheral vascular disease ([place])    Unstable angina ([place]) 11/25/2017    Medications:  Eliquis 5 mg BID - last dose on 8/1  Assessment: 70 year old male with recurrent ischemia of the left lower extremity s/p LLE angiogram with intervention. Per vascular, pt still with significant disease and will require bilateral femoral endarterectomies with possible LLE intervention planned for 8/3. Pharmacy has been consulted to start heparin at a fixed rate per Dr. Lucky Cowboy.     Plan:  8/3:  aPTT @ 0520 = 35 Will continue pt on current rate  Ramello Cordial D 09/30/2020,5:54 AM

## 2020-09-30 NOTE — Anesthesia Procedure Notes (Signed)
Arterial Line Insertion Start/End8/04/2020 2:37 PM Performed by: Velna Hatchet, MD, anesthesiologist  Patient location: Pre-op. Preanesthetic checklist: patient identified, IV checked, site marked, risks and benefits discussed, surgical consent, monitors and equipment checked, pre-op evaluation, timeout performed and anesthesia consent Lidocaine 1% used for infiltration Right, radial was placed Catheter size: 20 G Hand hygiene performed  and maximum sterile barriers used   Attempts: 1 (1 attempt by [facility]) Procedure performed using ultrasound guided technique. Following insertion, dressing applied. Post procedure assessment: normal and unchanged  Patient tolerated the procedure well with no immediate complications.

## 2020-10-01 ENCOUNTER — Encounter: Payer: Self-pay | Admitting: Vascular Surgery

## 2020-10-01 LAB — BASIC METABOLIC PANEL
Anion gap: 8 (ref 5–15)
BUN: 26 mg/dL — ABNORMAL HIGH (ref 8–23)
CO2: 26 mmol/L (ref 22–32)
Calcium: 7.9 mg/dL — ABNORMAL LOW (ref 8.9–10.3)
Chloride: 104 mmol/L (ref 98–111)
Creatinine, Ser: 1.08 mg/dL (ref 0.61–1.24)
GFR, Estimated: >60 mL/min (ref >60)
Glucose, Bld: 123 mg/dL — ABNORMAL HIGH (ref 70–99)
Potassium: 4.6 mmol/L (ref 3.5–5.1)
Sodium: 138 mmol/L (ref 135–145)

## 2020-10-01 LAB — CBC
HCT: 35.9 % — ABNORMAL LOW (ref 39.0–52.0)
Hemoglobin: 11.9 g/dL — ABNORMAL LOW (ref 13.0–17.0)
MCH: 32.2 pg (ref 26.0–34.0)
MCHC: 33.1 g/dL (ref 30.0–36.0)
MCV: 97 fL (ref 80.0–100.0)
Platelets: 150 10*3/uL (ref 150–400)
RBC: 3.7 MIL/uL — ABNORMAL LOW (ref 4.22–5.81)
RDW: 13.7 % (ref 11.5–15.5)
WBC: 7.6 10*3/uL (ref 4.0–10.5)
nRBC: 0 % (ref 0.0–0.2)

## 2020-10-01 MED ORDER — POLYETHYLENE GLYCOL 3350 17 G PO PACK: 17.0000 g | PACK | Freq: Every day | ORAL | Status: DC | PRN

## 2020-10-01 MED ORDER — DOCUSATE SODIUM 100 MG PO CAPS
100.0000 mg | ORAL_CAPSULE | Freq: Two times a day (BID) | ORAL | Status: DC
  Administered 2020-10-01 – 2020-10-02 (×2): 100 mg via ORAL
  Filled 2020-10-01 (×2): qty 1

## 2020-10-01 MED ORDER — SODIUM CHLORIDE 0.9 % IV BOLUS
500.0000 mL | Freq: Once | INTRAVENOUS | Status: AC
  Administered 2020-09-30: 500 mL via INTRAVENOUS

## 2020-10-01 NOTE — Progress Notes (Signed)
This morning Dr. Lucky Cowboy gave order for heart healthy diet. Just spoke with MD via secure chat and asked about need for foley catheter and arterial line. MD gave order to discontinue both.

## 2020-10-01 NOTE — Evaluation (Signed)
Occupational Therapy Evaluation Patient Details Name: LADARION CHARON MRN: [id number] DOB: March 25, 1950 Today's Date: 10/01/2020    History of Present Illness Audry Pili Benninger is a 22yoM who comes to [facility] 8/1 for scheduled LLE angio procedure c Dr. Lucky Cowboy 2/2 ongoing PAD. Due to significant disease, required 2nd procedure on 8/3. Last week had underwent left SFA/popliteal/tibial intervention but over the past couple of days he has developed numbness and pain in his feet again. PMH: Quad bypass Sept 2021. PTA no device needed for limited community distances, longer AMB limited by PAD.   Clinical Impression   Pt seen for OT evaluation this date in setting of acute hospitalization s/p revascularization. Pt reports being INDEP at baseline including playing golf. Pt presents this date with understandable decreased tolerance for hip flexion/extension and internal/external rotation, impacting his ability to perform LB ADLs. Pt able to perform bed mobility and STS with no AD with SUPV only, demos good control and balance. D/t post op pain, pt requires MOD A for LB ADLs including simulating dressing tasks while seated EOB.  OT educates pt re: use of reacher for LB dressing and modifying with slip on shoes and loose fitting pants/underwear. Pt with good understanding. Will continue to follow acutely. Pt denies need for f/u [facility] services.     Follow Up Recommendations  Follow surgeon's recommendation for DC plan and follow-up therapies    Equipment Recommendations  Other (comment) (grabber for LB dressing)    Recommendations for Other Services       Precautions / Restrictions Precautions Precautions: None Restrictions Weight Bearing Restrictions: No      Mobility Bed Mobility Overal bed mobility: Modified Independent             General bed mobility comments: increased time, HOB eleavted    Transfers Overall transfer level: Modified independent Equipment used: None             General  transfer comment: increased time, somewhat difficult, but not sway or need for physical assistance.    Balance Overall balance assessment: Modified Independent                                         ADL either performed or assessed with clinical judgement   ADL Overall ADL's : Needs assistance/impaired                                       General ADL Comments: INDEP for seated UB ADLs, SUPV for ADL transfers and standing, requires MIN/MOD A for LB dressing, ed re: AE for LB ADLs.     Vision Patient Visual Report: No change from baseline       Perception     Praxis      Pertinent Vitals/Pain Pain Assessment: 0-10 Pain Score: 5  Pain Location: bilat femoral access points Pain Descriptors / Indicators: Tightness;Tender Pain Intervention(s): Limited activity within patient's tolerance;Monitored during session     Hand Dominance Right   Extremity/Trunk Assessment Upper Extremity Assessment Upper Extremity Assessment: Overall WFL for tasks assessed   Lower Extremity Assessment Lower Extremity Assessment:  (expected post-op limitations with hip flexion and internal/external rotation, impacting LB ADLs)       Communication Communication Communication: No difficulties   Cognition Arousal/Alertness: Awake/alert Behavior During Therapy: WFL for tasks assessed/performed Overall Cognitive  Status: Within Functional Limits for tasks assessed                                     General Comments       Exercises Other Exercises Other Exercises: OT ed with pt re: LB ADLs with AE and modifications including slip on shoes, use of grabber to don LB clothing includign pants/underwear   Shoulder Instructions      Home Living Family/patient expects to be discharged to:: Private residence Living Arrangements: Spouse/significant other Available Help at Discharge: Available 24 hours/day;Friend(s) Type of Home: House Home Access:  Stairs to enter [employer] of Steps: 3. Entrance Stairs-Rails: Right;Left Home Layout: One level     Bathroom Shower/Tub: Hospital doctor Toilet: Handicapped height     Home Equipment: Environmental consultant - 4 wheels   Additional Comments: Sept 2021 quadruple bipass      Prior Functioning/Environment Level of Independence: Independent        Comments: enjoys golf, dancing        OT Problem List: Decreased range of motion;Pain      OT Treatment/Interventions: Self-care/ADL training;Therapeutic exercise;Therapeutic activities    OT Goals(Current goals can be found in the care plan section) Acute Rehab OT Goals Patient Stated Goal: improve INDEP so spouse doesnt have to help OT Goal Formulation: With patient Time For Goal Achievement: 10/15/20 Potential to Achieve Goals: Good ADL Goals Pt Will Perform Lower Body Dressing: with supervision;with adaptive equipment;sit to/from stand  OT Frequency: Min 1X/week   Barriers to D/C:            Co-evaluation              AM-PAC OT "6 Clicks" Daily Activity     Outcome Measure Help from another person eating meals?: None Help from another person taking care of personal grooming?: None Help from another person toileting, which includes using toliet, bedpan, or urinal?: A Little Help from another person bathing (including washing, rinsing, drying)?: A Little Help from another person to put on and taking off regular upper body clothing?: None Help from another person to put on and taking off regular lower body clothing?: A Little 6 Click Score: 21   End of Session    Activity Tolerance: Patient tolerated treatment well Patient left: in bed;with call bell/phone within reach  OT Visit Diagnosis: Muscle weakness (generalized) (M62.81);Pain Pain - part of body:  (bilateral groin)                Time: [id number] OT Time Calculation (min): 9 min Charges:  OT General Charges $OT Visit: 1 Visit OT  Evaluation $OT Eval Low Complexity: Pastos, MS, OTR/L ascom [phone] 10/01/20, 5:51 PM

## 2020-10-01 NOTE — Progress Notes (Signed)
Patient transferred to room 227 by wheelchair with [place]. Patient alert with no distress noted when leaving ICU. Bilateral groin sites free of complication.

## 2020-10-01 NOTE — Progress Notes (Signed)
[facility] Daily Progress Note   09/30/20: 1.   Left common femoral, profunda femoris, and superficial femoral artery endarterectomies 2.   Right common femoral, profunda femoris, and superficial femoral artery endarterectomies 3.   Left lower extremity angiogram  Subjective: Patient with some incisional soreness. No acute issues overnight.   Objective: Vitals:   10/01/20 0930 10/01/20 1000 10/01/20 1030 10/01/20 1100  BP:  101/64  104/64  Pulse: 64 (!) 58 64 67  Resp: '13 20 12 13  '$ Temp:      TempSrc:      SpO2: 98% 97% 99% 96%  Weight:      Height:        Intake/Output Summary (Last 24 hours) at 10/01/2020 1110 Last data filed at 10/01/2020 1000 Gross per 24 hour  Intake 3413.42 ml  Output 1400 ml  Net 2013.42 ml   Physical Exam: A&Ox3, NAD CV: RRR Pulmonary: CTA Bilaterally Abdomen: Soft, Nontender, Nondistended Right Groin: OR dressing clean and dry. Left Groin: OR dressing clean and dry. Vascular:  LLE: Thigh soft. Calf soft. Warm distally. Good capillary refill.   RLE: Thigh soft. Calf soft. Warm distally. Good capillary refill.    Laboratory: CBC    Component Value Date/Time   WBC 7.6 10/01/2020 0540   HGB 11.9 (L) 10/01/2020 0540   HGB 11.9 (L) 12/04/2019 1022   HCT 35.9 (L) 10/01/2020 0540   HCT 35.3 (L) 12/04/2019 1022   PLT 150 10/01/2020 0540   PLT 326 12/04/2019 1022   BMET    Component Value Date/Time   NA 138 10/01/2020 0711   NA 141 06/29/2020 0906   K 4.6 10/01/2020 0711   CL 104 10/01/2020 0711   CO2 26 10/01/2020 0711   GLUCOSE 123 (H) 10/01/2020 0711   BUN 26 (H) 10/01/2020 0711   BUN 42 (H) 06/29/2020 0906   CREATININE 1.08 10/01/2020 0711   CREATININE 1.12 07/23/2015 1016   CALCIUM 7.9 (L) 10/01/2020 0711   GFRNONAA >60 10/01/2020 0711   GFRAA 85 04/22/2020 1013   Assessment/Planning:  1) Gram drip in Hbg - Antihypertensives held this AM. 2) pain seems controlled 3) discontinue foley / a-line 4) OOB /  Ambulation 5) OK to transfer to the floor  Discussed with Dr. Ellis Parents Dillard Pascal PA-C 10/01/2020 11:10 AM

## 2020-10-01 NOTE — Anesthesia Postprocedure Evaluation (Signed)
Anesthesia Post Note  Patient: Elyn Aquas Kiesler  Procedure(s) Performed: ENDARTERECTOMY FEMORAL (Bilateral) APPLICATION OF CELL SAVER  Patient location during evaluation: PACU Anesthesia Type: General Level of consciousness: awake and alert Pain management: pain level controlled Vital Signs Assessment: post-procedure vital signs reviewed and stable Respiratory status: spontaneous breathing, nonlabored ventilation, respiratory function stable and patient connected to nasal cannula oxygen Cardiovascular status: blood pressure returned to baseline and stable Postop Assessment: no apparent nausea or vomiting Anesthetic complications: no   Pt had a short duration of hypotension in pacu treated with fluid and phenylephrine bolus. His groin sites were evaluated with no hematoma noted. Pt had no complaints of chest pain with no respiratory distress.  No notable events documented.   Last Vitals:  Vitals:   10/01/20 0400 10/01/20 0500  BP: (!) 88/61 101/64  Pulse: 63 64  Resp: 11 13  Temp: 36.5 C   SpO2: 98% 98%    Last Pain:  Vitals:   10/01/20 0504  TempSrc:   PainSc: [place]

## 2020-10-01 NOTE — Progress Notes (Signed)
Report called to Ernestene Mention, RN. Patient will be moved to room 227.

## 2020-10-01 NOTE — Evaluation (Signed)
Physical Therapy Evaluation Patient Details Name: [name] MRN: [id number] DOB: [birth year] Today's Date: 10/01/2020   History of Present Illness  [name] is a 32yoM who comes to [facility] 8/1 for scheduled LLE angio procedure c Dr. Lucky Cowboy 2/2 ongoing PAD. Due to significant disease, required 2nd procedure on 8/3. Last week had underwent left SFA/popliteal/tibial intervention but over the past couple of days he has developed numbness and pain in his feet again. PMH: Quad bypass Sept 2021. PTA no device needed for limited community distances, longer AMB limited by PAD.  Clinical Impression  Pt admitted with above diagnosis. Pt currently with functional limitations due to the deficits listed below (see "PT Problem List"). Upon entry, pt in recliner, awake and agreeable to participate- RN recently DC Rt radial arterial line. Wife at bedside. The pt is alert, pleasant, interactive, and able to provide info regarding prior level of function, both in tolerance and independence. MinguardA or less for transfers, and careful, slow AMB in room. No dizziness in standing, BP stable from sitting to standing. Pt able to AMB ~61f in room, to door and back several times, no gross LOB. Patient's performance this date reveals decreased ability, independence, and tolerance in performing all basic mobility required for performance of activities of daily living. Pt denies any concern with performance of safe household AMB upon return to home. Pt requires additional DME, close physical assistance, and cues for safe participate in mobility. Pt will benefit from skilled PT intervention to increase independence and safety with basic mobility in preparation for discharge to the venue listed below.   *verified per pt's RN, 2nd ICU RN, and vascular PA that patient has no activity restrictions of the Rt wrist s/p removal of arterial line.      Follow Up Recommendations No PT follow up;Follow surgeon's recommendation for DC plan and  follow-up therapies Pt declines any need for HHPT services at DC.    Equipment Recommendations  None recommended by PT    Recommendations for Other Services       Precautions / Restrictions Precautions Precautions: None Restrictions Weight Bearing Restrictions: No      Mobility  Bed Mobility               General bed mobility comments: up to chair with RN    Transfers Overall transfer level: Modified independent Equipment used: None                Ambulation/Gait Ambulation/Gait assistance: Min guard Gait Distance (Feet): 75 Feet Assistive device: None       General Gait Details: acute unsteadiness, no gross LOB  Stairs            Wheelchair Mobility    Modified Rankin (Stroke Patients Only)       Balance Overall balance assessment: Modified Independent                                           Pertinent Vitals/Pain Pain Assessment: 0-10 Pain Score: 5  Pain Location: bilat femoral access points Pain Intervention(s): Limited activity within patient's tolerance;Monitored during session    Home Living Family/patient expects to be discharged to:: Private residence Living Arrangements: Spouse/significant other Available Help at Discharge: Available 24 hours/day;Friend(s) Type of Home: House Home Access: Stairs to enter Entrance Stairs-Rails: Right;Left Entrance Stairs-Number of Steps: 3. Home Layout: One level Home Equipment: Walker - 4  wheels Additional Comments: Sept 2021 quadruple bipass    Prior Function                 Hand Dominance   Dominant Hand: Right    Extremity/Trunk Assessment   Upper Extremity Assessment Upper Extremity Assessment: Overall WFL for tasks assessed    Lower Extremity Assessment Lower Extremity Assessment: Generalized weakness;Overall WFL for tasks assessed       Communication      Cognition Arousal/Alertness: Awake/alert Behavior During Therapy: WFL for tasks  assessed/performed Overall Cognitive Status: Within Functional Limits for tasks assessed                                        General Comments      Exercises     Assessment/Plan    PT Assessment Patient needs continued PT services  PT Problem List Decreased activity tolerance;Decreased balance;Decreased mobility       PT Treatment Interventions DME instruction;Balance training;Gait training;Stair training;Functional mobility training;Therapeutic activities;Therapeutic exercise;Patient/family education    PT Goals (Current goals can be found in the Care Plan section)  Acute Rehab PT Goals Patient Stated Goal: improve AMB tolerance, reduce pain in bilat groin PT Goal Formulation: With patient Time For Goal Achievement: 10/15/20 Potential to Achieve Goals: Fair    Frequency Min 2X/week   Barriers to discharge        Co-evaluation               AM-PAC PT "6 Clicks" Mobility  Outcome Measure Help needed turning from your back to your side while in a flat bed without using bedrails?: A Little Help needed moving from lying on your back to sitting on the side of a flat bed without using bedrails?: A Little Help needed moving to and from a bed to a chair (including a wheelchair)?: A Little Help needed standing up from a chair using your arms (e.g., wheelchair or bedside chair)?: A Little Help needed to walk in hospital room?: A Little Help needed climbing 3-5 steps with a railing? : A Little 6 Click Score: 18    End of Session Equipment Utilized During Treatment: Gait belt Activity Tolerance: Patient tolerated treatment well;Patient limited by pain Patient left: in chair;with family/visitor present;with call bell/phone within reach Nurse Communication: Mobility status (arterial line restrictions?) PT Visit Diagnosis: Unsteadiness on feet (R26.81);Difficulty in walking, not elsewhere classified (R26.2)    Time: [id number] PT Time Calculation (min)  (ACUTE ONLY): 25 min   Charges:   PT Evaluation $PT Eval Moderate Complexity: 1 Mod         1:16 PM, 10/01/20 Etta Grandchild, PT, DPT Physical Therapist - [facility]  [phone] ([place])    [place] C 10/01/2020, 1:13 PM

## 2020-10-01 NOTE — Progress Notes (Signed)
Steg[place], PA at bedside and gave verbal order to hold entresto and coreg this morning due to marginal blood pressure.  PA also gave orders to transfer patient to med surge with tele.

## 2020-10-01 NOTE — Progress Notes (Signed)
Patient was given all scheduled medications along with a PRN pain medication. Initial BP per the arterial line was systolic [id number]. BP meds were given. BP noted to drop to systolic Q000111Q and [id number] per the arterial line. Bilateral dorsalis pedis pulses 1+. Skin warm and dry. Incisions to groin intact and clean; dressings in place. Patient asymptomatic. On-call physician called. Gave verbal order to give 500cc bolus over 1 hour and continue to monitor. Action was completed. BP is now Systolic 0000000 to low [id number]. MAP is 68 at this time. Patient continues to be asymptomatic at this time. Call bell in reach. Will continue to monitor.

## 2020-10-02 ENCOUNTER — Ambulatory Visit (INDEPENDENT_AMBULATORY_CARE_PROVIDER_SITE_OTHER): Payer: PPO | Admitting: Vascular Surgery

## 2020-10-02 ENCOUNTER — Encounter (INDEPENDENT_AMBULATORY_CARE_PROVIDER_SITE_OTHER): Payer: PPO

## 2020-10-02 LAB — BPAM RBC
Blood Product Expiration Date: 202208202359
Blood Product Expiration Date: 202208222359
Unit Type and Rh: 7300
Unit Type and Rh: 7300

## 2020-10-02 LAB — TYPE AND SCREEN
ABO/RH(D): B POS
Antibody Screen: NEGATIVE
Unit division: 0
Unit division: 0

## 2020-10-02 LAB — BASIC METABOLIC PANEL
Anion gap: 4 — ABNORMAL LOW (ref 5–15)
BUN: 30 mg/dL — ABNORMAL HIGH (ref 8–23)
CO2: 29 mmol/L (ref 22–32)
Calcium: 8.4 mg/dL — ABNORMAL LOW (ref 8.9–10.3)
Chloride: 106 mmol/L (ref 98–111)
Creatinine, Ser: 0.98 mg/dL (ref 0.61–1.24)
GFR, Estimated: >60 mL/min (ref >60)
Glucose, Bld: 101 mg/dL — ABNORMAL HIGH (ref 70–99)
Potassium: 4.2 mmol/L (ref 3.5–5.1)
Sodium: 139 mmol/L (ref 135–145)

## 2020-10-02 LAB — CBC
HCT: 34.3 % — ABNORMAL LOW (ref 39.0–52.0)
Hemoglobin: 11.2 g/dL — ABNORMAL LOW (ref 13.0–17.0)
MCH: 31.8 pg (ref 26.0–34.0)
MCHC: 32.7 g/dL (ref 30.0–36.0)
MCV: 97.4 fL (ref 80.0–100.0)
Platelets: 130 10*3/uL — ABNORMAL LOW (ref 150–400)
RBC: 3.52 MIL/uL — ABNORMAL LOW (ref 4.22–5.81)
RDW: 13.9 % (ref 11.5–15.5)
WBC: 6.7 10*3/uL (ref 4.0–10.5)
nRBC: 0 % (ref 0.0–0.2)

## 2020-10-02 LAB — SURGICAL PATHOLOGY

## 2020-10-02 LAB — PREPARE RBC (CROSSMATCH)

## 2020-10-02 LAB — MAGNESIUM: Magnesium: 2.1 mg/dL (ref 1.7–2.4)

## 2020-10-02 MED ORDER — OXYCODONE HCL 5 MG PO TABS: 5.0000 mg | ORAL_TABLET | Freq: Four times a day (QID) | ORAL | 0 refills | Status: DC | PRN

## 2020-10-02 NOTE — Progress Notes (Signed)
Physical Therapy Treatment Patient Details Name: [name] MRN: [id number] DOB: Jan 11, 1951 Today's Date: 10/02/2020    History of Present Illness [name] is a 45yoM who comes to [facility] 8/1 for scheduled LLE angio procedure c Dr. Lucky Cowboy 2/2 ongoing PAD. Due to significant disease, required 2nd procedure on 8/3. Last week had underwent left SFA/popliteal/tibial intervention but over the past couple of days he has developed numbness and pain in his feet again. PMH: Quad bypass Sept 2021. PTA no device needed for limited community distances, longer AMB limited by PAD.    PT Comments    Pt in bed upon entry, reports significantly more today today, not at femoral access sites but  bilat central anterior thighs with paresthesias and burning. Pt reports noticing this morning, but much worse after sitting recliner for a bit and since improved after return to bed and pain meds. In AMB the pain intensifies to 9-10/10, Rt worse than left. Pt needs minA for legs back into bed, minguard for transfers and AMB. Pt AMB far better previous day without device, but today more pain limited. Despite pain issues, he still would very much like to DC to home today. Pt educated on avoiding prolonged hip flexion at 90 degrees to avoid any potential femoral nerve irritation, also discussed with vascular PA. Will continue to follow.   Follow Up Recommendations  No PT follow up;Follow surgeon's recommendation for DC plan and follow-up therapies     Equipment Recommendations  None recommended by PT    Recommendations for Other Services       Precautions / Restrictions Precautions Precautions: None    Mobility  Bed Mobility Overal bed mobility: Needs Assistance Bed Mobility: Supine to Sit;Sit to Supine     Supine to sit: Modified independent (Device/Increase time) Sit to supine: Min assist   General bed mobility comments: pain limited    Transfers Overall transfer level: Needs assistance Equipment used:  Rolling walker (2 wheeled) Transfers: Sit to/from Stand           General transfer comment: increased time, somewhat difficult  Ambulation/Gait Ambulation/Gait assistance: Min guard Gait Distance (Feet): 120 Feet Assistive device: Rolling walker (2 wheeled) Gait Pattern/deviations: WFL(Within Functional Limits);Step-to pattern     General Gait Details: much more pain limited, at 50% distance begins to limp with reduced RLE weightbearing and using a toe touch pattern. Pt returned to room.   Stairs             Wheelchair Mobility    Modified Rankin (Stroke Patients Only)       Balance                                            Cognition Arousal/Alertness: Awake/alert Behavior During Therapy: WFL for tasks assessed/performed Overall Cognitive Status: Within Functional Limits for tasks assessed                                        Exercises      General Comments        Pertinent Vitals/Pain Pain Assessment: 0-10 Pain Score: 10-Worst pain ever Pain Location: has bilat anterior thigh burning and paresthesias Pain Intervention(s): Limited activity within patient's tolerance;Monitored during session;Premedicated before session    Home Living  Prior Function            PT Goals (current goals can now be found in the care plan section) Acute Rehab PT Goals Patient Stated Goal: improve INDEP so spouse doesnt have to help PT Goal Formulation: With patient Time For Goal Achievement: 10/15/20 Potential to Achieve Goals: Fair Progress towards PT goals: Progressing toward goals    Frequency    Min 2X/week      PT Plan Current plan remains appropriate    Co-evaluation              AM-PAC PT "6 Clicks" Mobility   Outcome Measure  Help needed turning from your back to your side while in a flat bed without using bedrails?: A Little Help needed moving from lying on your back to  sitting on the side of a flat bed without using bedrails?: A Little Help needed moving to and from a bed to a chair (including a wheelchair)?: A Little Help needed standing up from a chair using your arms (e.g., wheelchair or bedside chair)?: A Little Help needed to walk in hospital room?: A Little Help needed climbing 3-5 steps with a railing? : A Lot 6 Click Score: 17    End of Session Equipment Utilized During Treatment: Gait belt Activity Tolerance: Patient tolerated treatment well;Patient limited by pain Patient left: with call bell/phone within reach;in bed;with nursing/sitter in room Nurse Communication: Mobility status PT Visit Diagnosis: Unsteadiness on feet (R26.81);Difficulty in walking, not elsewhere classified (R26.2)     Time: [id number] PT Time Calculation (min) (ACUTE ONLY): 31 min  Charges:  $Therapeutic Activity: 8-22 mins $Self Care/Home Management: 8-22                    1:28 PM, 10/02/20 Etta Grandchild, PT, DPT Physical Therapist - [facility]  [phone] ([place])      [place] C 10/02/2020, 1:23 PM

## 2020-10-02 NOTE — Discharge Summary (Signed)
[facility]    Discharge Summary  Patient ID:  [name] MRN: [id number] DOB/AGE: 1950-05-13 70 y.o.  Admit date: 09/28/2020 Discharge date: 10/02/2020 Date of Surgery: 09/30/2020 Surgeon: Surgeon(s): Dew, Erskine Squibb, MD Schnier, Dolores Lory, MD  Admission Diagnosis: Ischemic leg [I99.8]  Discharge Diagnoses:  Ischemic leg [I99.8]  Secondary Diagnoses: Past Medical History:  Diagnosis Date   Abnormal nuclear cardiac imaging test 08/08/2015   Arthritis    fingers   Carotid artery occlusion    CHF (congestive heart failure) (HCC)    Coronary atherosclerosis of native coronary artery 01/29/2013   11/05/19 R/LHC 80% dLMCA stenosis small diffusely dz dLAD, chronically occluded OM1 50%mRCA lesion, widely patent mLCx strent, moderately elevated L heart filling pressures, mild to moderate RH filling pressures, normal to moderately reduced CO   Duodenal erosion    Encounter for screening for lung cancer 07/13/2016   Esophageal stenosis    esophageal dilation   GERD (gastroesophageal reflux disease)    H. pylori infection    Heart attack ([place]) Oct. 2009   Mild   Hiatal hernia    Hyperlipidemia    Hypertension    Old myocardial infarction 11/29/2007   Mildly elevated troponin, isolated value in October 2009. Cardiac catheterization-nonobstructive 60% RCA disease-subsequent nuclear stress test-9 minutes, low risk, mild inferior wall hypokinesis    Pain in limb 12/19/2017   Peripheral vascular disease ([place])    Unstable angina ([place]) 11/25/2017   Procedure(s): 09/28/20:             1.  Ultrasound guidance for vascular access right femoral artery             2.  Catheter placement into left common femoral artery from right femoral approach             3.  Aortogram and selective left lower extremity angiogram             4.  Catheter directed thrombolytic therapy with 6 mg of tPA in the left tibioperoneal trunk popliteal, superficial femoral arteries             5.   Mechanical thrombectomy with the penumbra CAT 6 catheter the left SFA, popliteal artery, tibioperoneal trunk, and posterior tibial artery             6.  Percutaneous transluminal angioplasty of left tibioperoneal trunk and most proximal posterior tibial artery with 4 mm diameter by 4 cm length Lutonix drug-coated angioplasty balloon             7.  Percutaneous transluminal angioplasty of the left SFA and popliteal arteries with 5 mm 22 cm length Lutonix drug-coated angioplasty balloon             8.  Viabahn stent placement with 6 mm diameter by 15 cm length stent to the left SFA and above-knee popliteal arteries             9.  StarClose closure device right femoral artery    09/30/20: 1.   Left common femoral, profunda femoris, and superficial femoral artery endarterectomies 2.   Right common femoral, profunda femoris, and superficial femoral artery endarterectomies 3.   Left lower extremity angiogram  Discharged Condition: Good  HPI / Hospital Course:  Patient presents with severe peripheral arterial disease.  He has undergone many left lower extremity revascularizations previously and his most recent angiogram had severe bilateral common femoral artery and femoral bifurcation disease.  Bilateral femoral endarterectomies are  planned to try to improve perfusion.  The risks and benefits as well as alternative therapies including intervention were reviewed in detail all questions were answered the patient agrees to proceed with surgery. On 09/28/20, the patient underwent:              1.  Ultrasound guidance for vascular access right femoral artery             2.  Catheter placement into left common femoral artery from right femoral approach             3.  Aortogram and selective left lower extremity angiogram             4.  Catheter directed thrombolytic therapy with 6 mg of tPA in the left tibioperoneal trunk popliteal, superficial femoral arteries             5.  Mechanical thrombectomy with  the penumbra CAT 6 catheter the left SFA, popliteal artery, tibioperoneal trunk, and posterior tibial artery             6.  Percutaneous transluminal angioplasty of left tibioperoneal trunk and most proximal posterior tibial artery with 4 mm diameter by 4 cm length Lutonix drug-coated angioplasty balloon             7.  Percutaneous transluminal angioplasty of the left SFA and popliteal arteries with 5 mm 22 cm length Lutonix drug-coated angioplasty balloon             8.  Viabahn stent placement with 6 mm diameter by 15 cm length stent to the left SFA and above-knee popliteal arteries             9.  StarClose closure device right femoral artery  He tolerated the procedure well and was trasnfered to the ICU for TPA / heparin overnight. Night of procedure unremarkable. Patient returned to the OR on 09/30/20 and underwent femoral endarterectomies. Tolerated the procedure well. Night of surgery unremarkable. During this inpatient stay, his diet was advanced, his pain was controlled thorough the use of PO pain meds, and he was ambulating at baseline. Day of discharge, the patient was afebrile with vital stable sign and an improved physical exam.    Physical Exam:  A&Ox3, NAD CV: RRR Pulm: CTA bilaterally Abdomen: soft, non-tender, Non-distended Extremity:             Left Lower Extremity: Thigh soft, calf soft. Warm distally to toes. Good cap refill. Incisions: clean and dry.   Labs as below  Complications: None  Consults: None  Significant Diagnostic Studies: CBC Lab Results  Component Value Date   WBC 6.7 10/02/2020   HGB 11.2 (L) 10/02/2020   HCT 34.3 (L) 10/02/2020   MCV 97.4 10/02/2020   PLT 130 (L) 10/02/2020   BMET    Component Value Date/Time   NA 139 10/02/2020 0459   NA 141 06/29/2020 0906   K 4.2 10/02/2020 0459   CL 106 10/02/2020 0459   CO2 29 10/02/2020 0459   GLUCOSE 101 (H) 10/02/2020 0459   BUN 30 (H) 10/02/2020 0459   BUN 42 (H) 06/29/2020 0906   CREATININE  0.98 10/02/2020 0459   CREATININE 1.12 07/23/2015 1016   CALCIUM 8.4 (L) 10/02/2020 0459   GFRNONAA >60 10/02/2020 0459   GFRAA 85 04/22/2020 1013   COAG Lab Results  Component Value Date   INR 1.0 09/30/2020   INR 1.1 09/28/2020   INR 1.4 (H) 11/11/2019   Disposition:  Discharge to :Home  Allergies as of 10/02/2020       Reactions   Brilinta [ticagrelor] Shortness Of Breath   Chlorhexidine Gluconate Other (See Comments)   Skin burning for hours afterward   Contrast Media [iodinated Diagnostic Agents] Itching   Face and head flushing, nose itching after contrast administation for angiogram   Statins Other (See Comments)   Failed Crestor 5 mg twice weekly, Crestor 20 mg daily, Pravastatin 40 mg qd, Lipitor, Zocor - muscle aches   Zetia [ezetimibe] Other (See Comments)   Muscle aches        Medication List     STOP taking these medications    traMADol 50 MG tablet Commonly known as: ULTRAM       TAKE these medications    apixaban 5 MG Tabs tablet Commonly known as: ELIQUIS Take 1 tablet (5 mg total) by mouth 2 (two) times daily.   aspirin EC 81 MG tablet Take 1 tablet (81 mg total) by mouth daily.   carvedilol 12.5 MG tablet Commonly known as: COREG Take 1 tablet (12.5 mg total) by mouth 2 (two) times daily.   colchicine 0.6 MG tablet Take 1 tablet (0.6 mg total) by mouth daily as needed (Gout).   nitroGLYCERIN 0.4 MG SL tablet Commonly known as: Nitrostat Place 1 tablet (0.4 mg total) under the tongue every 5 (five) minutes as needed for chest pain.   oxyCODONE 5 MG immediate release tablet Commonly known as: Oxy IR/ROXICODONE Take 1-2 tablets (5-10 mg total) by mouth every 6 (six) hours as needed for severe pain or moderate pain.   Praluent 150 MG/ML Soaj Generic drug: Alirocumab Inject 1 pen into the skin every 14 (fourteen) days.   sacubitril-valsartan 49-51 MG Commonly known as: ENTRESTO Take 1 tablet by mouth 2 (two) times daily.    traZODone 50 MG tablet Commonly known as: DESYREL TAKE 1 TABLET BY MOUTH AT BEDTIME AS NEEDED FOR SLEEP.       Verbal and written Discharge instructions given to the patient. Wound care per Discharge AVS  Follow-up Information     Kris Hartmann, NP Follow up in 2 week(s).   Specialty: Vascular Surgery Why: First post-op. No studies needed. Contact information: [place] [address] [phone]                Signed: Sela Hua, PA-C  10/02/2020, 1:06 PM

## 2020-10-02 NOTE — Progress Notes (Signed)
Eliquis coupon provided.  Oleh Genin, [place]

## 2020-10-02 NOTE — Discharge Instructions (Addendum)
You may remove your dressing and shower as of Sunday.  Gently clean your incisions with soap and water. Gently pat dry.  Please do not engage in strenuous activity or lifting greater than 10 pounds until you are cleared to do so.

## 2020-10-06 NOTE — Progress Notes (Signed)
Electrophysiology Office Follow up Visit Note:    Date:  10/07/2020   ID:  [name], DOB Jan 29, 1951, MRN [id number]  PCP:  Glean Hess, MD  [facility] Cardiologist:  None  [facility] Electrophysiologist:  Vickie Epley, MD    Interval History:    [name] is a 70 y.o. male who presents for a follow up visit after his CRT-D implant on 03/23/2020.   After CRT-D implant, the EF improved to 55% from 30% previously.   The patient had an endarterectomy with Dr. Lucky Cowboy last week.  He tells me the procedure was uncomplicated.  He was discharged but has not been doing that well at home.  He has had decreased p.o. intake.  No fevers or chills.  No syncope or presyncope.  He does feel a bit lightheaded and weak.  He is very uncomfortable in the room today.  He is with his wife who I have previously met.  He tells me he was constipated after the procedure but finally had a bowel movement yesterday.   Past Medical History:  Diagnosis Date   Abnormal nuclear cardiac imaging test 08/08/2015   Arthritis    fingers   Carotid artery occlusion    CHF (congestive heart failure) (HCC)    Coronary atherosclerosis of native coronary artery 01/29/2013   11/05/19 R/LHC 80% dLMCA stenosis small diffusely dz dLAD, chronically occluded OM1 50%mRCA lesion, widely patent mLCx strent, moderately elevated L heart filling pressures, mild to moderate RH filling pressures, normal to moderately reduced CO   Duodenal erosion    Encounter for screening for lung cancer 07/13/2016   Esophageal stenosis    esophageal dilation   GERD (gastroesophageal reflux disease)    H. pylori infection    Heart attack ([place]) Oct. 2009   Mild   Hiatal hernia    Hyperlipidemia    Hypertension    Old myocardial infarction 11/29/2007   Mildly elevated troponin, isolated value in October 2009. Cardiac catheterization-nonobstructive 60% RCA disease-subsequent nuclear stress test-9 minutes, low risk, mild inferior wall  hypokinesis    Pain in limb 12/19/2017   Peripheral vascular disease ([place])    Unstable angina ([place]) 11/25/2017    Past Surgical History:  Procedure Laterality Date   APPENDECTOMY     BACK SURGERY     BIV ICD INSERTION CRT-D N/A 03/23/2020   Procedure: BIV ICD INSERTION CRT-D;  Surgeon: Vickie Epley, MD;  Location: [place] [facility];  Service: Cardiovascular;  Laterality: N/A;   CARDIAC CATHETERIZATION N/A 08/07/2015   Procedure: Left Heart Cath and Coronary Angiography;  Surgeon: [name], MD;  Location: [facility] [facility];  Service: Cardiovascular;  Laterality: N/A;   CARDIAC CATHETERIZATION N/A 08/07/2015   Procedure: Coronary Stent Intervention;  Surgeon: [name], MD;  Location: [place];  Service: Cardiovascular;  Laterality: N/A;   CARDIAC CATHETERIZATION N/A 08/07/2015   Procedure: Coronary Stent Intervention;  Surgeon: [name], MD;  Location: [facility] [facility];  Service: Cardiovascular;  Laterality: N/A;   CAROTID ENDARTERECTOMY  01/05/2006   Right  CEA with DPA   CATARACT EXTRACTION W/ INTRAOCULAR LENS IMPLANT Left 12/04/2017   CATARACT EXTRACTION W/PHACO Left 12/04/2017   Procedure: CATARACT EXTRACTION PHACO AND INTRAOCULAR LENS PLACEMENT ([place]) LEFT;  Surgeon: Eulogio Bear, MD;  Location: [place];  Service: Ophthalmology;  Laterality: Left;   CATARACT EXTRACTION W/PHACO Right 02/06/2018   Procedure: CATARACT EXTRACTION PHACO AND INTRAOCULAR LENS PLACEMENT (  IOC)RIGHT;  Surgeon: Eulogio Bear, MD;  Location: [place];  Service: Ophthalmology;  Laterality: Right;   COLONOSCOPY  05/20/2008   COLONOSCOPY WITH PROPOFOL N/A 09/13/2018   Procedure: COLONOSCOPY WITH BIOPSY;  Surgeon: [name], MD;  Location: [place];  Service: Endoscopy;  Laterality: N/A;   CORONARY ARTERY BYPASS GRAFT N/A 11/11/2019   Procedure: CORONARY ARTERY BYPASS GRAFTING (CABG) USING LIMA to Diag1; ENDOSCOPICALLY HARVESTED RIGHT GREATER  SAPHENOUS VEIN: SVG to OM1; SVG to OM2; SVG to PDA.;  Surgeon: Ivin Poot, MD;  Location: [facility];  Service: Open Heart Surgery;  Laterality: N/A;   CORONARY STENT PLACEMENT  08/07/2015   MID CIRCUMFLEX   ENDARTERECTOMY FEMORAL Bilateral 09/30/2020   Procedure: ENDARTERECTOMY FEMORAL;  Surgeon: Algernon Huxley, MD;  Location: [facility];  Service: Vascular;  Laterality: Bilateral;   ENDOVEIN HARVEST OF GREATER SAPHENOUS VEIN Right 11/11/2019   Procedure: ENDOVEIN HARVEST OF GREATER SAPHENOUS VEIN;  Surgeon: Ivin Poot, MD;  Location: [facility];  Service: Open Heart Surgery;  Laterality: Right;   ESOPHAGOGASTRODUODENOSCOPY (EGD) WITH PROPOFOL N/A 02/11/2019   Procedure: ESOPHAGOGASTRODUODENOSCOPY (EGD) WITH BIOPSY and  Dilation;  Surgeon: [name], MD;  Location: [facility];  Service: Endoscopy;  Laterality: N/A;   HIP SURGERY Left 10/2016   left hip tendon repair   LEFT HEART CATH AND CORONARY ANGIOGRAPHY N/A 11/27/2017   Procedure: LEFT HEART CATH AND CORONARY ANGIOGRAPHY;  Surgeon: [place], MD;  Location: [facility];  Service: Cardiovascular;  Laterality: N/A;   LOWER EXTREMITY ANGIOGRAPHY Left 02/12/2018   Procedure: LOWER EXTREMITY ANGIOGRAPHY;  Surgeon: Algernon Huxley, MD;  Location: [place] [facility];  Service: Cardiovascular;  Laterality: Left;   LOWER EXTREMITY ANGIOGRAPHY Left 03/07/2018   Procedure: LOWER EXTREMITY ANGIOGRAPHY;  Surgeon: Algernon Huxley, MD;  Location: [facility];  Service: Cardiovascular;  Laterality: Left;   LOWER EXTREMITY ANGIOGRAPHY Left 06/04/2018   Procedure: LOWER EXTREMITY ANGIOGRAPHY;  Surgeon: Algernon Huxley, MD;  Location: [place];  Service: Cardiovascular;  Laterality: Left;   LOWER EXTREMITY ANGIOGRAPHY Left 09/17/2020   Procedure: LOWER EXTREMITY ANGIOGRAPHY;  Surgeon: Algernon Huxley, MD;  Location: [facility];  Service: Cardiovascular;  Laterality: Left;   LOWER EXTREMITY ANGIOGRAPHY Left 09/28/2020    Procedure: LOWER EXTREMITY ANGIOGRAPHY;  Surgeon: Algernon Huxley, MD;  Location: [facility] [facility];  Service: Cardiovascular;  Laterality: Left;   PLACEMENT OF IMPELLA LEFT VENTRICULAR ASSIST DEVICE N/A 11/11/2019   Procedure: PLACEMENT OF IMPELLA LEFT VENTRICULAR ASSIST DEVICE 5.5;  Surgeon: Ivin Poot, MD;  Location: [place];  Service: Open Heart Surgery;  Laterality: N/A;  Midline Sternotomy   POLYPECTOMY N/A 09/13/2018   Procedure: POLYPECTOMY;  Surgeon: [name], MD;  Location: [place];  Service: Endoscopy;  Laterality: N/A;   POLYPECTOMY N/A 02/11/2019   Procedure: POLYPECTOMY;  Surgeon: [name], MD;  Location: [place];  Service: Endoscopy;  Laterality: N/A;   REMOVAL OF IMPELLA LEFT VENTRICULAR ASSIST DEVICE N/A 11/15/2019   Procedure: REMOVAL OF IMPELLA 5.5 LEFT VENTRICULAR ASSIST DEVICE;  Surgeon: Ivin Poot, MD;  Location: [place];  Service: Open Heart Surgery;  Laterality: N/A;   RIGHT/LEFT HEART CATH AND CORONARY ANGIOGRAPHY N/A 11/05/2019   Procedure: RIGHT/LEFT HEART CATH AND CORONARY ANGIOGRAPHY;  Surgeon: Nelva Bush, MD;  Location: [facility];  Service: Cardiovascular;  Laterality: N/A;   SPINE SURGERY     TEE WITHOUT CARDIOVERSION  N/A 11/11/2019   Procedure: TRANSESOPHAGEAL ECHOCARDIOGRAM (TEE);  Surgeon: Prescott Gum, Collier Salina, MD;  Location: [place];  Service: Open Heart Surgery;  Laterality: N/A;   TEE WITHOUT CARDIOVERSION N/A 11/15/2019   Procedure: TRANSESOPHAGEAL ECHOCARDIOGRAM (TEE);  Surgeon: Prescott Gum, Collier Salina, MD;  Location: [place];  Service: Open Heart Surgery;  Laterality: N/A;   TONSILLECTOMY      Current Medications: Current Meds  Medication Sig   Alirocumab (PRALUENT) 150 MG/ML SOAJ Inject 1 pen into the skin every 14 (fourteen) days.   apixaban (ELIQUIS) 5 MG TABS tablet Take 1 tablet (5 mg total) by mouth 2 (two) times daily.   aspirin EC 81 MG tablet Take 1 tablet (81 mg total) by mouth daily.   carvedilol (COREG)  12.5 MG tablet Take 1 tablet (12.5 mg total) by mouth 2 (two) times daily.   colchicine 0.6 MG tablet Take 1 tablet (0.6 mg total) by mouth daily as needed (Gout).   nitroGLYCERIN (NITROSTAT) 0.4 MG SL tablet Place 1 tablet (0.4 mg total) under the tongue every 5 (five) minutes as needed for chest pain.   oxyCODONE (OXY IR/ROXICODONE) 5 MG immediate release tablet Take 1-2 tablets (5-10 mg total) by mouth every 6 (six) hours as needed for severe pain or moderate pain.   sacubitril-valsartan (ENTRESTO) 49-51 MG Take 1 tablet by mouth 2 (two) times daily.   traZODone (DESYREL) 50 MG tablet TAKE 1 TABLET BY MOUTH AT BEDTIME AS NEEDED FOR SLEEP.     Allergies:   Brilinta [ticagrelor], Chlorhexidine gluconate, Contrast media [iodinated diagnostic agents], Statins, and Zetia [ezetimibe]   Social History   Socioeconomic History   Marital status: Married    Spouse name: Not on file   Number of children: 1   Years of education: Not on file   Highest education level: Bachelor's degree (e.g., BA, AB, BS)  Occupational History   Not on file  Tobacco Use   Smoking status: Former    Packs/day: 1.25    Years: 35.00    Pack years: 43.75    Types: Cigarettes    Quit date: 02/28/2005    Years since quitting: 15.6   Smokeless tobacco: Current    Types: Snuff   Tobacco comments:    occaisionally  Vaping Use   Vaping Use: Never used  Substance and Sexual Activity   Alcohol use: Yes    Alcohol/week: 8.0 - 10.0 standard drinks    Types: 8 - 10 Glasses of wine per week    Comment: weekly   Drug use: No   Sexual activity: Yes  Other Topics Concern   Not on file  Social History Narrative   Not on file   Social Determinants of Health   Financial Resource Strain: Low Risk    Difficulty of Paying Living Expenses: Not hard at all  Food Insecurity: No Food Insecurity   Worried About Charity fundraiser in the Last Year: Never true   Ran Out of Food in the Last Year: Never true  Transportation  Needs: No Transportation Needs   Lack of Transportation (Medical): No   Lack of Transportation (Non-Medical): No  Physical Activity: Insufficiently Active   Days of Exercise per Week: 2 days   Minutes of Exercise per Session: 60 min  Stress: No Stress Concern Present   Feeling of Stress : Not at all  Social Connections: Moderately Integrated   Frequency of Communication with Friends and Family: More than three times a week   Frequency of Social  Gatherings with Friends and Family: Three times a week   Attends Religious Services: Never   Active Member of Clubs or Organizations: Yes   Attends Music therapist: More than 4 times per year   Marital Status: Married     Family History: The patient's family history includes Cancer (age of onset: 52) in his father; Coronary artery disease in his mother; Diabetes in his mother; Heart attack in his father and mother; Heart disease in his father and mother; Hypertension in his father and mother; Stroke in his father. There is no history of Colon cancer, Colon polyps, Esophageal cancer, Rectal cancer, or Stomach cancer.  ROS:   Please see the history of present illness.    All other systems reviewed and are negative.  EKGs/Labs/Other Studies Reviewed:    The following studies were reviewed today:  08/10/2020 Echo personally reviewed EF 55% RV normal Mild MR   October 07, 2020 device interrogation in clinic personally reviewed Battery longevity 5.2 to 5.6 years Lead parameters are all stable Biventricular pacing 93% PVC burden 3.8% Underlying rhythm is sinus rhythm coreview suggests no volume overload Changed the left ventricular capture pacing margins   EKG:  The ekg ordered today demonstrates AV paced.  Biventricular paced rhythm.  QRS duration is approximately 120 to 130 ms.  Recent Labs: 12/04/2019: BNP 202.7 07/24/2020: TSH 3.260 09/28/2020: ALT 23 10/02/2020: BUN 30; Creatinine, Ser 0.98; Hemoglobin 11.2; Magnesium 2.1;  Platelets 130; Potassium 4.2; Sodium 139  Recent Lipid Panel    Component Value Date/Time   CHOL 144 01/07/2020 1601   CHOL 144 12/10/2018 0925   TRIG 158 (H) 01/07/2020 1601   HDL 62 01/07/2020 1601   HDL 59 12/10/2018 0925   CHOLHDL 2.3 01/07/2020 1601   VLDL 32 01/07/2020 1601   LDLCALC 50 01/07/2020 1601   LDLCALC 68 12/10/2018 0925    Physical Exam:    VS:  BP (!) 71/47 (BP Location: Right Arm, Patient Position: Sitting, Cuff Size: Normal)   Pulse 64   Ht _0  (1.676 m)   Wt 177 lb (80.3 kg)   SpO2 94%   BMI 28.57 kg/m     Manual recheck of his blood pressure was 68/44.     Wt Readings from Last 3 Encounters:  10/07/20 176 lb 5.9 oz (80 kg)  10/07/20 177 lb (80.3 kg)  09/30/20 183 lb 10.3 oz (83.3 kg)     GEN: Uncomfortable appearing.  Mild distress because of pain and lightheadedness.  In wheelchair HEENT: Normal NECK: No JVD; No carotid bruits LYMPHATICS: No lymphadenopathy CARDIAC: RRR, no murmurs, rubs, gallops.  CRT-D pocket well-healed. RESPIRATORY:  Clear to auscultation without rales, wheezing or rhonchi  ABDOMEN: Soft, non-tender, non-distended MUSCULOSKELETAL:  No edema; No deformity  SKIN: Warm and dry NEUROLOGIC:  Alert and oriented x 3 PSYCHIATRIC:  Normal affect   ASSESSMENT:    1. Hypotension after procedure   2. Chronic systolic heart failure ([place])   3. Left bundle branch block   4. Cardiac resynchronization therapy defibrillator (CRT-D) in place   5. Essential hypertension   6. Nonischemic cardiomyopathy ([place])    PLAN:    In order of problems listed above:  1. Hypotension after procedure Patient is quite hypotensive today in clinic with systolics in the high 20F on manual check.  He feels poorly with lightheadedness.  He is afebrile.  I think he needs to be evaluated emergently in the emergency department to rule out postoperative complication.  Also possible  is dehydration postop given reduced p.o. intake by history.  2. Chronic  systolic heart failure (HCC) Warm.  I do not think he is in cardiogenic shock.  He is dry on exam. Continue CRT as above. Will need to hold his Entresto, Coreg in the setting of profound hypotension.  3. Left bundle branch block 4. Cardiac resynchronization therapy defibrillator (CRT-D) in place  Device functioning well.     Patient sent to emergency department by wheelchair.  I did speak to the ER physician, Dr. Duffy Bruce who will be evaluating him.  I also sent a message to the vascular surgeon, Dr. Lucky Cowboy.     Total time spent with patient today 45 minutes. This includes reviewing records, evaluating the patient and coordinating care.   Medication Adjustments/Labs and Tests Ordered: Current medicines are reviewed at length with the patient today.  Concerns regarding medicines are outlined above.  Orders Placed This Encounter  Procedures   EKG 12-Lead   No orders of the defined types were placed in this encounter.    Signed, Lars Mage, MD, [facility], [facility] 10/07/2020 10:11 AM    Electrophysiology [facility]

## 2020-10-07 ENCOUNTER — Ambulatory Visit (INDEPENDENT_AMBULATORY_CARE_PROVIDER_SITE_OTHER): Payer: PPO | Admitting: Cardiology

## 2020-10-07 ENCOUNTER — Encounter: Payer: Self-pay | Admitting: Cardiology

## 2020-10-07 ENCOUNTER — Emergency Department
Admission: EM | Admit: 2020-10-07 | Discharge: 2020-10-07 | Disposition: A | Discharge status: Home / Self Care | Payer: PPO | Attending: Emergency Medicine | Admitting: Emergency Medicine

## 2020-10-07 ENCOUNTER — Emergency Department: Payer: PPO

## 2020-10-07 ENCOUNTER — Other Ambulatory Visit: Payer: Self-pay

## 2020-10-07 VITALS — BP 71/47 | HR 64 | Ht 66.0 in | Wt 177.0 lb

## 2020-10-07 DIAGNOSIS — Z7901 Long term (current) use of anticoagulants: Secondary | ICD-10-CM | POA: Insufficient documentation

## 2020-10-07 DIAGNOSIS — Z7982 Long term (current) use of aspirin: Secondary | ICD-10-CM | POA: Insufficient documentation

## 2020-10-07 DIAGNOSIS — Z79899 Other long term (current) drug therapy: Secondary | ICD-10-CM | POA: Diagnosis not present

## 2020-10-07 DIAGNOSIS — I5022 Chronic systolic (congestive) heart failure: Secondary | ICD-10-CM | POA: Diagnosis not present

## 2020-10-07 DIAGNOSIS — Z95810 Presence of automatic (implantable) cardiac defibrillator: Secondary | ICD-10-CM

## 2020-10-07 DIAGNOSIS — Z951 Presence of aortocoronary bypass graft: Secondary | ICD-10-CM | POA: Diagnosis not present

## 2020-10-07 DIAGNOSIS — E860 Dehydration: Secondary | ICD-10-CM

## 2020-10-07 DIAGNOSIS — I10 Essential (primary) hypertension: Secondary | ICD-10-CM | POA: Diagnosis not present

## 2020-10-07 DIAGNOSIS — I9581 Postprocedural hypotension: Secondary | ICD-10-CM | POA: Diagnosis not present

## 2020-10-07 DIAGNOSIS — I447 Left bundle-branch block, unspecified: Secondary | ICD-10-CM | POA: Diagnosis not present

## 2020-10-07 DIAGNOSIS — I110 Hypertensive heart disease with heart failure: Secondary | ICD-10-CM | POA: Diagnosis not present

## 2020-10-07 DIAGNOSIS — I25110 Atherosclerotic heart disease of native coronary artery with unstable angina pectoris: Secondary | ICD-10-CM | POA: Insufficient documentation

## 2020-10-07 DIAGNOSIS — F17200 Nicotine dependence, unspecified, uncomplicated: Secondary | ICD-10-CM | POA: Insufficient documentation

## 2020-10-07 DIAGNOSIS — I428 Other cardiomyopathies: Secondary | ICD-10-CM | POA: Diagnosis not present

## 2020-10-07 DIAGNOSIS — R531 Weakness: Secondary | ICD-10-CM | POA: Diagnosis present

## 2020-10-07 DIAGNOSIS — I959 Hypotension, unspecified: Secondary | ICD-10-CM | POA: Diagnosis not present

## 2020-10-07 LAB — BASIC METABOLIC PANEL
Anion gap: 12 (ref 5–15)
BUN: 30 mg/dL — ABNORMAL HIGH (ref 8–23)
CO2: 27 mmol/L (ref 22–32)
Calcium: 8.6 mg/dL — ABNORMAL LOW (ref 8.9–10.3)
Chloride: 98 mmol/L (ref 98–111)
Creatinine, Ser: 1.39 mg/dL — ABNORMAL HIGH (ref 0.61–1.24)
GFR, Estimated: 55 mL/min — ABNORMAL LOW (ref >60)
Glucose, Bld: 109 mg/dL — ABNORMAL HIGH (ref 70–99)
Potassium: 4.6 mmol/L (ref 3.5–5.1)
Sodium: 137 mmol/L (ref 135–145)

## 2020-10-07 LAB — URINALYSIS, COMPLETE (UACMP) WITH MICROSCOPIC
Bacteria, UA: NONE SEEN
Bilirubin Urine: NEGATIVE
Glucose, UA: NEGATIVE mg/dL
Hgb urine dipstick: NEGATIVE
Ketones, ur: NEGATIVE mg/dL
Leukocytes,Ua: NEGATIVE
Nitrite: NEGATIVE
Protein, ur: NEGATIVE mg/dL
Specific Gravity, Urine: 1.017 (ref 1.005–1.030)
Squamous Epithelial / HPF: NONE SEEN (ref 0–5)
pH: 6 (ref 5.0–8.0)

## 2020-10-07 LAB — CBC
HCT: 34.4 % — ABNORMAL LOW (ref 39.0–52.0)
Hemoglobin: 11.5 g/dL — ABNORMAL LOW (ref 13.0–17.0)
MCH: 32.8 pg (ref 26.0–34.0)
MCHC: 33.4 g/dL (ref 30.0–36.0)
MCV: 98 fL (ref 80.0–100.0)
Platelets: 218 10*3/uL (ref 150–400)
RBC: 3.51 MIL/uL — ABNORMAL LOW (ref 4.22–5.81)
RDW: 13.2 % (ref 11.5–15.5)
WBC: 5.1 10*3/uL (ref 4.0–10.5)
nRBC: 0 % (ref 0.0–0.2)

## 2020-10-07 LAB — HEPATIC FUNCTION PANEL
ALT: 21 U/L (ref 0–44)
AST: 19 U/L (ref 15–41)
Albumin: 3 g/dL — ABNORMAL LOW (ref 3.5–5.0)
Alkaline Phosphatase: 68 U/L (ref 38–126)
Bilirubin, Direct: 0.2 mg/dL (ref 0.0–0.2)
Indirect Bilirubin: 0.8 mg/dL (ref 0.3–0.9)
Total Bilirubin: 1 mg/dL (ref 0.3–1.2)
Total Protein: 6.8 g/dL (ref 6.5–8.1)

## 2020-10-07 LAB — LACTIC ACID, PLASMA: Lactic Acid, Venous: 1.2 mmol/L (ref 0.5–1.9)

## 2020-10-07 LAB — TROPONIN I (HIGH SENSITIVITY)
Troponin I (High Sensitivity): 9 ng/L (ref <18)
Troponin I (High Sensitivity): 8 ng/L (ref <18)

## 2020-10-07 LAB — PROTIME-INR
INR: 1.2 (ref 0.8–1.2)
Prothrombin Time: 15.6 seconds — ABNORMAL HIGH (ref 11.4–15.2)

## 2020-10-07 MED ORDER — SODIUM CHLORIDE 0.9 % IV BOLUS
1000.0000 mL | Freq: Once | INTRAVENOUS | Status: AC
  Administered 2020-10-07: 1000 mL via INTRAVENOUS

## 2020-10-07 MED ORDER — OXYCODONE-ACETAMINOPHEN 5-325 MG PO TABS
2.0000 | ORAL_TABLET | Freq: Once | ORAL | Status: AC
  Administered 2020-10-07: 2 via ORAL
  Filled 2020-10-07: qty 2

## 2020-10-07 MED ORDER — ONDANSETRON HCL 4 MG/2ML IJ SOLN
4.0000 mg | Freq: Once | INTRAMUSCULAR | Status: AC
  Administered 2020-10-07: 4 mg via INTRAVENOUS
  Filled 2020-10-07: qty 2

## 2020-10-07 NOTE — Discharge Instructions (Addendum)
Drink AT LEAST 6-8 glasses of water daily  Aim for your urine to be clear  Follow-up with Dr. Lucky Cowboy for your pain  Continue your pain meds and stool softeners

## 2020-10-07 NOTE — Patient Instructions (Signed)
Medication Instructions:  Your physician recommends that you continue on your current medications as directed. Please refer to the Current Medication list given to you today.  *If you need a refill on your cardiac medications before your next appointment, please call your pharmacy*   Lab Work: None ordered.  If you have labs (blood work) drawn today and your tests are completely normal, you will receive your results only by: [place] (if you have MyChart) OR A paper copy in the mail If you have any lab test that is abnormal or we need to change your treatment, we will call you to review the results.   Testing/Procedures: Dr Quentin Ore recommends you go to the ED now   Follow-Up: At [facility], you and your health needs are our priority.  As part of our continuing mission to provide you with exceptional heart care, we have created designated Provider Care Teams.  These Care Teams include your primary Cardiologist (physician) and Advanced Practice Providers (APPs -  Physician Assistants and Nurse Practitioners) who all work together to provide you with the care you need, when you need it.  We recommend signing up for the patient portal called "MyChart".  Sign up information is provided on this After Visit Summary.  MyChart is used to connect with patients for Virtual Visits (Telemedicine).  Patients are able to view lab/test results, encounter notes, upcoming appointments, etc.  Non-urgent messages can be sent to your provider as well.   To learn more about what you can do with MyChart, go to [email or web address].    Your next appointment:   12 month(s)  The format for your next appointment:   In Person  Provider:   Lars Mage, MD

## 2020-10-07 NOTE — ED Provider Notes (Signed)
[facility] Emergency Department Provider Note  ____________________________________________   Event Date/Time   First MD Initiated Contact with Patient 10/07/20 [phone]     (approximate)  I have reviewed the triage vital signs and the nursing notes.   HISTORY  Chief Complaint Hypotension and Post-op Problem    HPI [name] is a 70 y.o. male status post recent femoral endarterectomy here with generalized weakness.  The patient just had bilateral femoral endarterectomy with Dr. Lucky Cowboy on 8/3.  He tolerated it well.  He has been complaining of bilateral thigh pain that is burning and aching since then, but has otherwise been recovering fairly well.  He does admit he has had poor appetite and just started having normal bowel movements.  He reports that he has had some associated general fatigue.  He has not had much of an appetite.  He went to have a follow-up with cardiology today and was noted to be hypotensive.  He appeared uncomfortable.  He subsequently sent here for further evaluation.  Blood pressure was in the 0000000 to [id number] systolic.  He states otherwise he is without complaints.  His leg pain seems about consistent with what it has been since surgery.  Denies any distal lower extremity weakness or numbness.  No chest pain.  No shortness of breath.  No lightheadedness or dizziness.    Past Medical History:  Diagnosis Date   Abnormal nuclear cardiac imaging test 08/08/2015   Arthritis    fingers   Carotid artery occlusion    CHF (congestive heart failure) (HCC)    Coronary atherosclerosis of native coronary artery 01/29/2013   11/05/19 R/LHC 80% dLMCA stenosis small diffusely dz dLAD, chronically occluded OM1 50%mRCA lesion, widely patent mLCx strent, moderately elevated L heart filling pressures, mild to moderate RH filling pressures, normal to moderately reduced CO   Duodenal erosion    Encounter for screening for lung cancer 07/13/2016   Esophageal stenosis     esophageal dilation   GERD (gastroesophageal reflux disease)    H. pylori infection    Heart attack ([place]) Oct. 2009   Mild   Hiatal hernia    Hyperlipidemia    Hypertension    Old myocardial infarction 11/29/2007   Mildly elevated troponin, isolated value in October 2009. 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   Accelerating angina ([place]) 11/05/2019   Left bundle branch block 11/05/2019   Shortness of breath    Stricture and stenosis of esophagus    Polyp of colon    Rectal polyp    CAD, multiple vessel 08/13/2018   PAD (peripheral artery disease) ([place]) 08/13/2018   Carotid stenosis 07/17/2018   Atherosclerosis of artery of extremity with rest pain ([place]) [id number]   Chronic systolic heart failure ([place]) 03/12/2018   Nonischemic cardiomyopathy ([place]) 12/08/2017   Myalgia due to statin 12/08/2017   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 ([place]) 05/01/2017   Foot pain, bilateral 05/01/2017   Degenerative disc disease, 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   History of tobacco use  07/13/2016   Overweight (BMI 25.0-29.9) 07/13/2016   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   Dysphagia 11/26/2015   Chronic anticoagulation: plavix 10/15/2015   Chronic left hip pain 09/15/2015   Coronary artery disease involving native coronary artery of native heart without angina pectoris 08/08/2015   Essential hypertension 08/08/2015   Hyperlipidemia LDL goal <70 01/29/2013   History of CEA (carotid  endarterectomy) 11/08/2011    Past Surgical History:  Procedure Laterality Date   APPENDECTOMY     BACK SURGERY     BIV ICD INSERTION CRT-D N/A 03/23/2020   Procedure: BIV ICD INSERTION CRT-D;  Surgeon: Vickie Epley, MD;  Location: [place] [facility];  Service: Cardiovascular;  Laterality: N/A;   CARDIAC CATHETERIZATION N/A 08/07/2015   Procedure: Left Heart Cath and Coronary Angiography;  Surgeon: [name], MD;  Location: [place] [facility];  Service: Cardiovascular;  Laterality: N/A;   CARDIAC CATHETERIZATION N/A 08/07/2015   Procedure: Coronary Stent Intervention;  Surgeon: [name], MD;  Location: [place] [facility];  Service: Cardiovascular;  Laterality: N/A;   CARDIAC CATHETERIZATION N/A 08/07/2015   Procedure: Coronary Stent Intervention;  Surgeon: [name], MD;  Location: [place] [facility];  Service: Cardiovascular;  Laterality: N/A;   CAROTID ENDARTERECTOMY  01/05/2006   Right  CEA with DPA   CATARACT EXTRACTION W/ INTRAOCULAR LENS IMPLANT Left 12/04/2017   CATARACT EXTRACTION W/PHACO Left 12/04/2017   Procedure: CATARACT EXTRACTION PHACO AND INTRAOCULAR LENS PLACEMENT ([place]) LEFT;  Surgeon: Eulogio Bear, MD;  Location: [place];  Service: Ophthalmology;  Laterality: Left;   CATARACT EXTRACTION W/PHACO Right 02/06/2018   Procedure: CATARACT EXTRACTION PHACO AND INTRAOCULAR LENS PLACEMENT (IOC)RIGHT;  Surgeon: Eulogio Bear, MD;  Location: [place];  Service: Ophthalmology;  Laterality: Right;   COLONOSCOPY  05/20/2008   COLONOSCOPY WITH PROPOFOL N/A 09/13/2018   Procedure: COLONOSCOPY WITH BIOPSY;  Surgeon: [name], MD;  Location: [place];  Service: Endoscopy;  Laterality: N/A;   CORONARY ARTERY BYPASS GRAFT N/A 11/11/2019   Procedure: CORONARY ARTERY BYPASS GRAFTING (CABG) USING LIMA to Diag1; ENDOSCOPICALLY HARVESTED RIGHT GREATER SAPHENOUS VEIN: SVG to OM1; SVG to OM2; SVG to PDA.;  Surgeon: Ivin Poot, MD;   Location: [place];  Service: Open Heart Surgery;  Laterality: N/A;   CORONARY STENT PLACEMENT  08/07/2015   MID CIRCUMFLEX   ENDARTERECTOMY FEMORAL Bilateral 09/30/2020   Procedure: ENDARTERECTOMY FEMORAL;  Surgeon: Algernon Huxley, MD;  Location: [facility];  Service: Vascular;  Laterality: Bilateral;   ENDOVEIN HARVEST OF GREATER SAPHENOUS VEIN Right 11/11/2019   Procedure: ENDOVEIN HARVEST OF GREATER SAPHENOUS VEIN;  Surgeon: Ivin Poot, MD;  Location: [place];  Service: Open Heart Surgery;  Laterality: Right;   ESOPHAGOGASTRODUODENOSCOPY (EGD) WITH PROPOFOL N/A 02/11/2019   Procedure: ESOPHAGOGASTRODUODENOSCOPY (EGD) WITH BIOPSY and  Dilation;  Surgeon: [name], MD;  Location: [place];  Service: Endoscopy;  Laterality: N/A;   HIP SURGERY Left 10/2016   left hip tendon repair   LEFT HEART CATH AND CORONARY ANGIOGRAPHY N/A 11/27/2017   Procedure: LEFT HEART CATH AND CORONARY ANGIOGRAPHY;  Surgeon: Wellington Hampshire, MD;  Location: [place] [facility];  Service: Cardiovascular;  Laterality: N/A;   LOWER EXTREMITY ANGIOGRAPHY Left 02/12/2018   Procedure: LOWER EXTREMITY ANGIOGRAPHY;  Surgeon: Algernon Huxley, MD;  Location: [place] [facility];  Service: Cardiovascular;  Laterality: Left;   LOWER EXTREMITY ANGIOGRAPHY Left 03/07/2018   Procedure: LOWER EXTREMITY ANGIOGRAPHY;  Surgeon: Algernon Huxley, MD;  Location: [place] [facility];  Service: Cardiovascular;  Laterality: Left;   LOWER EXTREMITY ANGIOGRAPHY Left 06/04/2018   Procedure: LOWER EXTREMITY ANGIOGRAPHY;  Surgeon: Algernon Huxley, MD;  Location: [place] [facility];  Service: Cardiovascular;  Laterality: Left;   LOWER EXTREMITY ANGIOGRAPHY Left 09/17/2020   Procedure: LOWER EXTREMITY ANGIOGRAPHY;  Surgeon: Algernon Huxley, MD;  Location: [place] [facility];  Service: Cardiovascular;  Laterality: Left;   LOWER EXTREMITY ANGIOGRAPHY Left 09/28/2020   Procedure: LOWER EXTREMITY ANGIOGRAPHY;  Surgeon: Algernon Huxley, MD;  Location: [place] [facility];  Service: Cardiovascular;  Laterality: Left;   PLACEMENT OF IMPELLA LEFT VENTRICULAR ASSIST DEVICE N/A 11/11/2019   Procedure: PLACEMENT OF IMPELLA LEFT VENTRICULAR ASSIST DEVICE 5.5;  Surgeon: Ivin Poot, MD;  Location: [place];  Service: Open Heart Surgery;  Laterality: N/A;  Midline Sternotomy   POLYPECTOMY N/A 09/13/2018   Procedure: POLYPECTOMY;  Surgeon: [name], MD;  Location: [place];  Service: Endoscopy;  Laterality: N/A;   POLYPECTOMY N/A 02/11/2019   Procedure: POLYPECTOMY;  Surgeon: [name], MD;  Location: [place];  Service: Endoscopy;  Laterality: N/A;   REMOVAL OF IMPELLA LEFT VENTRICULAR ASSIST DEVICE N/A 11/15/2019   Procedure: REMOVAL OF IMPELLA 5.5 LEFT VENTRICULAR ASSIST DEVICE;  Surgeon: Ivin Poot, MD;  Location: [place];  Service: Open Heart Surgery;  Laterality: N/A;   RIGHT/LEFT HEART CATH AND CORONARY ANGIOGRAPHY N/A 11/05/2019   Procedure: RIGHT/LEFT HEART CATH AND CORONARY ANGIOGRAPHY;  Surgeon: Nelva Bush, MD;  Location: [place] [facility];  Service: Cardiovascular;  Laterality: N/A;   SPINE SURGERY     TEE WITHOUT CARDIOVERSION N/A 11/11/2019   Procedure: TRANSESOPHAGEAL ECHOCARDIOGRAM (TEE);  Surgeon: Prescott Gum, Collier Salina, MD;  Location: [place];  Service: Open Heart Surgery;  Laterality: N/A;   TEE WITHOUT CARDIOVERSION N/A 11/15/2019   Procedure: TRANSESOPHAGEAL ECHOCARDIOGRAM (TEE);  Surgeon: Prescott Gum, Collier Salina, MD;  Location: [place];  Service: Open Heart Surgery;  Laterality: N/A;   TONSILLECTOMY      Prior to Admission medications   Medication Sig Start Date End Date Taking? Authorizing Provider  Alirocumab (PRALUENT) 150 MG/ML SOAJ Inject 1 pen into the skin every 14 (fourteen) days. 09/01/20   Marrianne Mood D, PA-C  apixaban (ELIQUIS) 5 MG TABS tablet Take 1 tablet (5 mg total) by mouth 2 (two) times daily. 07/24/20 01/20/21  End, Harrell Gave, MD  aspirin EC 81 MG tablet Take 1 tablet (81 mg total) by  mouth daily. 09/17/20   Algernon Huxley, MD  carvedilol (COREG) 12.5 MG tablet Take 1 tablet (12.5 mg total) by mouth 2 (two) times daily. 05/20/20 05/15/21  End, Harrell Gave, MD  colchicine 0.6 MG tablet Take 1 tablet (0.6 mg total) by mouth daily as needed (Gout). 04/22/20   Marrianne Mood D, PA-C  nitroGLYCERIN (NITROSTAT) 0.4 MG SL tablet Place 1 tablet (0.4 mg total) under the tongue every 5 (five) minutes as needed for chest pain. 08/08/15   Erlene Quan, PA-C  oxyCODONE (OXY IR/ROXICODONE) 5 MG immediate release tablet Take 1-2 tablets (5-10 mg total) by mouth every 6 (six) hours as needed for severe pain or moderate pain. 10/02/20   Stegmayer, Janalyn Harder, PA-C  sacubitril-valsartan (ENTRESTO) 49-51 MG Take 1 tablet by mouth 2 (two) times daily. 07/24/20 07/19/21  Marrianne Mood D, PA-C  traZODone (DESYREL) 50 MG tablet TAKE 1 TABLET BY MOUTH AT BEDTIME AS NEEDED FOR SLEEP. 01/30/20   Lucianne Lei  Donney Rankins, MD    Allergies Brilinta [ticagrelor], Chlorhexidine gluconate, Contrast media [iodinated diagnostic agents], Statins, and Zetia [ezetimibe]  Family History  Problem Relation Age of Onset   Heart attack Mother    Coronary artery disease Mother    Heart disease Mother        Carotid Stenosis and BPG and Heart Disease before age 48   Diabetes Mother    Hypertension Mother    Heart attack Father    Heart disease Father        BPG and Heart Disease before age 10   Hypertension Father    Cancer Father 70       throat   Stroke Father    Colon cancer Neg Hx    Colon polyps Neg Hx    Esophageal cancer Neg Hx    Rectal cancer Neg Hx    Stomach cancer Neg Hx     Social History Social History   Tobacco Use   Smoking status: Former    Packs/day: 1.25    Years: 35.00    Pack years: 43.75    Types: Cigarettes    Quit date: 02/28/2005    Years since quitting: 15.6   Smokeless tobacco: Current    Types: Snuff   Tobacco comments:    occaisionally  Vaping Use   Vaping Use: Never used   Substance Use Topics   Alcohol use: Yes    Alcohol/week: 8.0 - 10.0 standard drinks    Types: 8 - 10 Glasses of wine per week    Comment: weekly   Drug use: No    Review of Systems  Review of Systems  Constitutional:  Positive for appetite change and fatigue. Negative for chills and fever.  HENT:  Negative for sore throat.   Respiratory:  Negative for shortness of breath.   Cardiovascular:  Negative for chest pain.  Gastrointestinal:  Negative for abdominal pain.  Genitourinary:  Negative for flank pain.  Musculoskeletal:  Positive for arthralgias. Negative for neck pain.  Skin:  Negative for rash and wound.  Allergic/Immunologic: Negative for immunocompromised state.  Neurological:  Positive for weakness. Negative for numbness.  Hematological:  Does not bruise/bleed easily.  All other systems reviewed and are negative.   ____________________________________________  PHYSICAL EXAM:      VITAL SIGNS: ED Triage Vitals  Enc Vitals Group     BP 10/07/20 0926 (!) 79/46     Pulse Rate 10/07/20 0927 63     Resp 10/07/20 0925 18     Temp 10/07/20 0925 98.3 F (36.8 C)     Temp src --      SpO2 10/07/20 0927 96 %     Weight 10/07/20 0927 176 lb 5.9 oz (80 kg)     Height 10/07/20 0927 '5\' 6"'$  (1.676 m)     Head Circumference --      Peak Flow --      Pain Score 10/07/20 0926 6     Pain Loc --      Pain Edu? --      Excl. in [place]? --      Physical Exam Vitals and nursing note reviewed.  Constitutional:      General: He is not in acute distress.    Appearance: He is well-developed.  HENT:     Head: Normocephalic and atraumatic.     Mouth/Throat:     Mouth: Mucous membranes are dry.  Eyes:     Conjunctiva/sclera: Conjunctivae normal.  Cardiovascular:  Rate and Rhythm: Normal rate and regular rhythm.     Heart sounds: Normal heart sounds. No murmur heard.   No friction rub.     Comments: 2+ DP pulses bilaterally.  Toes pink and well-perfused.  No  cyanosis. Pulmonary:     Effort: Pulmonary effort is normal. No respiratory distress.     Breath sounds: Normal breath sounds. No wheezing or rales.  Abdominal:     General: There is no distension.     Palpations: Abdomen is soft.     Tenderness: There is no abdominal tenderness.  Musculoskeletal:     Cervical back: Neck supple.  Skin:    General: Skin is warm.     Capillary Refill: Capillary refill takes less than 2 seconds.     Comments: Bilateral femoral endarterectomy sites with surgical dressings in place.  There is no bleeding, redness, or significant hematoma or induration bilaterally.  Neurological:     Mental Status: He is alert and oriented to person, place, and time.     Motor: No abnormal muscle tone.      ____________________________________________   LABS (all labs ordered are listed, but only abnormal results are displayed)  Labs Reviewed  BASIC METABOLIC PANEL - Abnormal; Notable for the following components:      Result Value   Glucose, Bld 109 (*)    BUN 30 (*)    Creatinine, Ser 1.39 (*)    Calcium 8.6 (*)    GFR, Estimated 55 (*)    All other components within normal limits  CBC - Abnormal; Notable for the following components:   RBC 3.51 (*)    Hemoglobin 11.5 (*)    HCT 34.4 (*)    All other components within normal limits  URINALYSIS, COMPLETE (UACMP) WITH MICROSCOPIC - Abnormal; Notable for the following components:   Color, Urine YELLOW (*)    APPearance CLEAR (*)    All other components within normal limits  HEPATIC FUNCTION PANEL - Abnormal; Notable for the following components:   Albumin 3.0 (*)    All other components within normal limits  PROTIME-INR - Abnormal; Notable for the following components:   Prothrombin Time 15.6 (*)    All other components within normal limits  CULTURE, BLOOD (ROUTINE X 2)  CULTURE, BLOOD (ROUTINE X 2)  LACTIC ACID, PLASMA  TROPONIN I (HIGH SENSITIVITY)  TROPONIN I (HIGH SENSITIVITY)     ____________________________________________  EKG: Ventricular rate 77.  PR 123, QRS 124.  QTc 596.  AV paced rhythm, no significant change from prior. ________________________________________  RADIOLOGY All imaging, including plain films, CT scans, and ultrasounds, independently reviewed by me, and interpretations confirmed via formal radiology reads.  ED MD interpretation:   Chest x-ray: No active disease  Official radiology report(s): DG Chest Portable 1 View  Result Date: 10/07/2020 CLINICAL DATA:  Hypotension.  Recent surgery. EXAM: PORTABLE CHEST 1 VIEW COMPARISON:  Chest x-ray dated March 23, 2020. FINDINGS: Unchanged left chest wall AICD. Stable cardiomediastinal silhouette status post CABG. Normal pulmonary vascularity. No focal consolidation, pleural effusion, or pneumothorax. No acute osseous abnormality. IMPRESSION: No active disease. Electronically Signed   By: Titus Dubin M.D.   On: 10/07/2020 10:17    ____________________________________________  PROCEDURES   Procedure(s) performed (including Critical Care):  Procedures  ____________________________________________  INITIAL IMPRESSION / MDM / [place] / ED COURSE  As part of my medical decision making, I reviewed the following data within the [facility] notes reviewed and incorporated,  Old chart reviewed, Notes from prior ED visits, and [place] Controlled Substance Database       *BILLAL MINGE was evaluated in Emergency Department on 10/07/2020 for the symptoms described in the history of present illness. He was evaluated in the context of the global COVID-19 pandemic, which necessitated consideration that the patient might be at risk for infection with the SARS-CoV-2 virus that causes COVID-19. Institutional protocols and algorithms that pertain to the evaluation of patients at risk for COVID-19 are in a state of rapid change based on information released by regulatory bodies  including the CDC and federal and state organizations. These policies and algorithms were followed during the patient's care in the ED.  Some ED evaluations and interventions may be delayed as a result of limited staffing during the pandemic.*     Medical Decision Making: 70 year old male here with generalized weakness in the setting of recent surgery.  He is significantly hypotensive but appears clinically dry and I suspect this is due to poor p.o. intake in the setting of his recent surgery.  CBC shows no evidence of significant leukocytosis or anemia.  Lactic acid is normal which is reassuring for no evidence of significant hypoperfusion.  LFTs normal.  Renal function slightly elevated which I suspect is due to dehydration.  UA unremarkable.  Chest x-ray reviewed and clear.  EKG nonischemic.  Troponin negative and I do not suspect ACS.  His operative sites appear clean, dry, with no evidence of infection, hematoma, and his distal strength, sensation, and pulses are fully intact.  I suspect patient had significant dehydration due to poor p.o. intake in the setting of taking his pain meds as well as relative constipation, which seems to have been improved.  Following 1 L of fluids his blood pressure is normal and he is amatory without difficulty.  He no longer feels weak.  I ran the case by Dr. Lucky Cowboy, does not feel anything is needed from a surgical perspective.  Will encourage the patient to hydrate at home, discharged with outpatient follow-up. ____________________________________________  FINAL CLINICAL IMPRESSION(S) / ED DIAGNOSES  Final diagnoses:  Dehydration     MEDICATIONS GIVEN DURING THIS VISIT:  Medications  sodium chloride 0.9 % bolus 1,000 mL (1,000 mLs Intravenous New Bag/Given 10/07/20 1030)  oxyCODONE-acetaminophen (PERCOCET/ROXICET) 5-325 MG per tablet 2 tablet (2 tablets Oral Given 10/07/20 1142)  ondansetron (ZOFRAN) injection 4 mg (4 mg Intravenous Given 10/07/20 1142)  sodium  chloride 0.9 % bolus 1,000 mL (1,000 mLs Intravenous New Bag/Given 10/07/20 1259)     ED Discharge Orders     None        Note:  This document was prepared using Dragon voice recognition software and may include unintentional dictation errors.   Duffy Bruce, MD 10/07/20 1346

## 2020-10-07 NOTE — ED Triage Notes (Signed)
Pt to ED from heart center for hypotension. Pt had surgery a week ago to have clots removed from groin. No hx of bp probs.  Denies dizziness.  Hypotensive on arrival  C/o pain to bilateral legs and back

## 2020-10-08 ENCOUNTER — Ambulatory Visit: Payer: Self-pay | Admitting: *Deleted

## 2020-10-08 NOTE — Telephone Encounter (Signed)
Called pt told him that since Dr. Army Melia has not seen him for this problem before then he would need to come in the office. Explained to pt that it would not be until next week maybe Monday or Tuesday that we could fit him in. Told pt since the cardiologist prescribed him the medication that he could try to see if they would refill it. Once telling pt it would be next week when we see him he stated " I will see if Im better by then" and abruptly got off the phone.  KP

## 2020-10-08 NOTE — Telephone Encounter (Signed)
Patient calls requesting prescription for colchicine 0.6 mg tabs for a gout flare at his left great toe that began last week. Symptoms include swelling, redness and painful to touch or walk. Stated he took the last of the prescription yesterday. Encouraged [name] to drink at least 8 glasses water/day during a flare.  He has upcoming on 02/24/21.  Currently he is recovering from Bilateral Endarterectomy performed last week at [facility]. He did say Dr. Army Melia has not treated him for gout in the past as he had the initial gout flare in September last year while recovering from heart surgery-at that time cardiology agreed to treat him for the gout then. He has since requested another prescription and was told to request from his pcp. Currently recoving from last week's procedures he is using a walker and not getting around very well.  Requesting a prescription for colchicine at this time.  [facility], [place].

## 2020-10-12 LAB — CULTURE, BLOOD (ROUTINE X 2)
Culture: NO GROWTH
Culture: NO GROWTH
Special Requests: ADEQUATE

## 2020-10-14 ENCOUNTER — Encounter: Payer: Self-pay | Admitting: Internal Medicine

## 2020-10-14 ENCOUNTER — Other Ambulatory Visit: Payer: Self-pay

## 2020-10-14 ENCOUNTER — Ambulatory Visit (INDEPENDENT_AMBULATORY_CARE_PROVIDER_SITE_OTHER): Payer: PPO | Admitting: Internal Medicine

## 2020-10-14 ENCOUNTER — Telehealth: Payer: PPO | Admitting: Internal Medicine

## 2020-10-14 VITALS — BP 122/82 | HR 64 | Temp 97.8°F | Ht 66.0 in | Wt 182.0 lb

## 2020-10-14 DIAGNOSIS — M109 Gout, unspecified: Secondary | ICD-10-CM | POA: Diagnosis not present

## 2020-10-14 MED ORDER — COLCHICINE 0.6 MG PO TABS: 0.6000 mg | ORAL_TABLET | Freq: Two times a day (BID) | ORAL | 0 refills | Status: DC

## 2020-10-14 NOTE — Progress Notes (Signed)
Date:  10/14/2020   Name:  [name]   DOB:  Oct 09, 1950   MRN:  [id number]   Chief Complaint: Gout (X1 year of and on, x1 -2 weeks recently, Left toe pain)  Toe Pain  The incident occurred more than 1 week ago. There was no injury mechanism. The pain is present in the left toes. The quality of the pain is described as burning and shooting. The pain is moderate. The pain has been Improving since onset. The symptoms are aggravated by palpation, movement and weight bearing. He has tried NSAIDs (dx by Cardiology with gout while hospitalized for CABG.  given cholchicine) for the symptoms. The treatment provided significant relief.  He reports no official diagnosis of gout.  No UA level drawn. He does eat red meat but does not drink beer.  He has had several flares since last fall.  Always in the great toe - right or left but no other joints.   Lab Results  Component Value Date   CREATININE 1.39 (H) 10/07/2020   BUN 30 (H) 10/07/2020   NA 137 10/07/2020   K 4.6 10/07/2020   CL 98 10/07/2020   CO2 27 10/07/2020   Lab Results  Component Value Date   CHOL 144 01/07/2020   HDL 62 01/07/2020   LDLCALC 50 01/07/2020   TRIG 158 (H) 01/07/2020   CHOLHDL 2.3 01/07/2020   Lab Results  Component Value Date   TSH 3.260 07/24/2020   Lab Results  Component Value Date   HGBA1C 5.6 11/07/2019   Lab Results  Component Value Date   WBC 5.1 10/07/2020   HGB 11.5 (L) 10/07/2020   HCT 34.4 (L) 10/07/2020   MCV 98.0 10/07/2020   PLT 218 10/07/2020   Lab Results  Component Value Date   ALT 21 10/07/2020   AST 19 10/07/2020   ALKPHOS 68 10/07/2020   BILITOT 1.0 10/07/2020     Review of Systems  Constitutional:  Negative for chills and fever.  Respiratory:  Negative for chest tightness and shortness of breath.   Musculoskeletal:  Positive for arthralgias and joint swelling.  Skin:  Positive for wound (recent LE revascularizations).   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   Accelerating angina ([place]) 11/05/2019   Left bundle branch block 11/05/2019   Shortness of breath    Stricture and stenosis of esophagus    Polyp of colon    Rectal polyp    CAD, multiple vessel 08/13/2018   PAD (peripheral artery disease) ([place]) 08/13/2018   Carotid stenosis 07/17/2018   Atherosclerosis of artery of extremity with rest pain ([place]) [id number]   Chronic systolic heart failure ([place]) 03/12/2018   Nonischemic cardiomyopathy ([place]) 12/08/2017   Myalgia due to statin 12/08/2017   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 ([place]) 05/01/2017   Foot pain, bilateral 05/01/2017   Degenerative disc disease, 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   History of tobacco use 07/13/2016   Overweight (BMI 25.0-29.9) 07/13/2016   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   Dysphagia 11/26/2015   Chronic anticoagulation: plavix 10/15/2015   Chronic left hip pain 09/15/2015   Coronary artery disease involving native coronary artery of native heart without angina pectoris 08/08/2015   Essential hypertension 08/08/2015  Hyperlipidemia LDL goal <70 01/29/2013   History of CEA (carotid endarterectomy) 11/08/2011    Allergies  Allergen Reactions   Brilinta [Ticagrelor] Shortness Of Breath   Chlorhexidine Gluconate Other (See Comments)    Skin burning for hours afterward   Contrast Media [Iodinated Diagnostic Agents] Itching    Face and head flushing, nose itching after contrast administation for angiogram   Statins Other (See Comments)    Failed Crestor 5 mg twice weekly, Crestor 20 mg daily, Pravastatin 40 mg qd, Lipitor, Zocor - muscle aches   Zetia [Ezetimibe] Other (See Comments)    Muscle aches    Past Surgical History:  Procedure  Laterality Date   APPENDECTOMY     BACK SURGERY     BIV ICD INSERTION CRT-D N/A 03/23/2020   Procedure: BIV ICD INSERTION CRT-D;  Surgeon: Vickie Epley, MD;  Location: [facility];  Service: Cardiovascular;  Laterality: N/A;   CARDIAC CATHETERIZATION N/A 08/07/2015   Procedure: Left Heart Cath and Coronary Angiography;  Surgeon: [name], MD;  Location: [facility];  Service: Cardiovascular;  Laterality: N/A;   CARDIAC CATHETERIZATION N/A 08/07/2015   Procedure: Coronary Stent Intervention;  Surgeon: [name], MD;  Location: [facility];  Service: Cardiovascular;  Laterality: N/A;   CARDIAC CATHETERIZATION N/A 08/07/2015   Procedure: Coronary Stent Intervention;  Surgeon: [name], MD;  Location: [facility];  Service: Cardiovascular;  Laterality: N/A;   CAROTID ENDARTERECTOMY  01/05/2006   Right  CEA with DPA   CATARACT EXTRACTION W/ INTRAOCULAR LENS IMPLANT Left 12/04/2017   CATARACT EXTRACTION W/PHACO Left 12/04/2017   Procedure: CATARACT EXTRACTION PHACO AND INTRAOCULAR LENS PLACEMENT ([place]) LEFT;  Surgeon: Eulogio Bear, MD;  Location: [facility];  Service: Ophthalmology;  Laterality: Left;   CATARACT EXTRACTION W/PHACO Right 02/06/2018   Procedure: CATARACT EXTRACTION PHACO AND INTRAOCULAR LENS PLACEMENT (IOC)RIGHT;  Surgeon: Eulogio Bear, MD;  Location: [facility];  Service: Ophthalmology;  Laterality: Right;   COLONOSCOPY  05/20/2008   COLONOSCOPY WITH PROPOFOL N/A 09/13/2018   Procedure: COLONOSCOPY WITH BIOPSY;  Surgeon: [name], MD;  Location: [facility];  Service: Endoscopy;  Laterality: N/A;   CORONARY ARTERY BYPASS GRAFT N/A 11/11/2019   Procedure: CORONARY ARTERY BYPASS GRAFTING (CABG) USING LIMA to Diag1; ENDOSCOPICALLY HARVESTED RIGHT GREATER SAPHENOUS VEIN: SVG to OM1; SVG to OM2; SVG to PDA.;  Surgeon: Ivin Poot, MD;  Location: [facility];  Service: Open Heart Surgery;  Laterality: N/A;    CORONARY STENT PLACEMENT  08/07/2015   MID CIRCUMFLEX   ENDARTERECTOMY FEMORAL Bilateral 09/30/2020   Procedure: ENDARTERECTOMY FEMORAL;  Surgeon: Algernon Huxley, MD;  Location: [facility];  Service: Vascular;  Laterality: Bilateral;   ENDOVEIN HARVEST OF GREATER SAPHENOUS VEIN Right 11/11/2019   Procedure: ENDOVEIN HARVEST OF GREATER SAPHENOUS VEIN;  Surgeon: Ivin Poot, MD;  Location: [facility];  Service: Open Heart Surgery;  Laterality: Right;   ESOPHAGOGASTRODUODENOSCOPY (EGD) WITH PROPOFOL N/A 02/11/2019   Procedure: ESOPHAGOGASTRODUODENOSCOPY (EGD) WITH BIOPSY and  Dilation;  Surgeon: [name], MD;  Location: [facility];  Service: Endoscopy;  Laterality: N/A;   HIP SURGERY Left 10/2016   left hip tendon repair   LEFT HEART CATH AND CORONARY ANGIOGRAPHY N/A 11/27/2017   Procedure: LEFT HEART CATH AND CORONARY ANGIOGRAPHY;  Surgeon: Wellington Hampshire, MD;  Location: [facility];  Service: Cardiovascular;  Laterality: N/A;   LOWER EXTREMITY ANGIOGRAPHY Left 02/12/2018  Procedure: LOWER EXTREMITY ANGIOGRAPHY;  Surgeon: Algernon Huxley, MD;  Location: [facility];  Service: Cardiovascular;  Laterality: Left;   LOWER EXTREMITY ANGIOGRAPHY Left 03/07/2018   Procedure: LOWER EXTREMITY ANGIOGRAPHY;  Surgeon: Algernon Huxley, MD;  Location: [place] [facility];  Service: Cardiovascular;  Laterality: Left;   LOWER EXTREMITY ANGIOGRAPHY Left 06/04/2018   Procedure: LOWER EXTREMITY ANGIOGRAPHY;  Surgeon: Algernon Huxley, MD;  Location: [facility] [place] [facility];  Service: Cardiovascular;  Laterality: Left;   LOWER EXTREMITY ANGIOGRAPHY Left 09/17/2020   Procedure: LOWER EXTREMITY ANGIOGRAPHY;  Surgeon: Algernon Huxley, MD;  Location: [facility];  Service: Cardiovascular;  Laterality: Left;   LOWER EXTREMITY ANGIOGRAPHY Left 09/28/2020   Procedure: LOWER EXTREMITY ANGIOGRAPHY;  Surgeon: Algernon Huxley, MD;  Location: [facility];  Service: Cardiovascular;  Laterality: Left;    PLACEMENT OF IMPELLA LEFT VENTRICULAR ASSIST DEVICE N/A 11/11/2019   Procedure: PLACEMENT OF IMPELLA LEFT VENTRICULAR ASSIST DEVICE 5.5;  Surgeon: Ivin Poot, MD;  Location: [facility];  Service: Open Heart Surgery;  Laterality: N/A;  Midline Sternotomy   POLYPECTOMY N/A 09/13/2018   Procedure: POLYPECTOMY;  Surgeon: [name], MD;  Location: [facility];  Service: Endoscopy;  Laterality: N/A;   POLYPECTOMY N/A 02/11/2019   Procedure: POLYPECTOMY;  Surgeon: [name], MD;  Location: [facility];  Service: Endoscopy;  Laterality: N/A;   REMOVAL OF IMPELLA LEFT VENTRICULAR ASSIST DEVICE N/A 11/15/2019   Procedure: REMOVAL OF IMPELLA 5.5 LEFT VENTRICULAR ASSIST DEVICE;  Surgeon: Ivin Poot, MD;  Location: [facility];  Service: Open Heart Surgery;  Laterality: N/A;   RIGHT/LEFT HEART CATH AND CORONARY ANGIOGRAPHY N/A 11/05/2019   Procedure: RIGHT/LEFT HEART CATH AND CORONARY ANGIOGRAPHY;  Surgeon: Nelva Bush, MD;  Location: [facility];  Service: Cardiovascular;  Laterality: N/A;   SPINE SURGERY     TEE WITHOUT CARDIOVERSION N/A 11/11/2019   Procedure: TRANSESOPHAGEAL ECHOCARDIOGRAM (TEE);  Surgeon: Prescott Gum, Collier Salina, MD;  Location: [place];  Service: Open Heart Surgery;  Laterality: N/A;   TEE WITHOUT CARDIOVERSION N/A 11/15/2019   Procedure: TRANSESOPHAGEAL ECHOCARDIOGRAM (TEE);  Surgeon: Prescott Gum, Collier Salina, MD;  Location: [facility] [place];  Service: Open Heart Surgery;  Laterality: N/A;   TONSILLECTOMY      Social History   Tobacco Use   Smoking status: Former    Packs/day: 1.25    Years: 35.00    Pack years: 43.75    Types: Cigarettes    Quit date: 02/28/2005    Years since quitting: 15.6   Smokeless tobacco: Current    Types: Snuff   Tobacco comments:    occaisionally  Vaping Use   Vaping Use: Never used  Substance Use Topics   Alcohol use: Yes    Alcohol/week: 8.0 - 10.0 standard drinks    Types: 8 - 10 Glasses of wine per week    Comment: weekly   Drug use: No      Medication list has been reviewed and updated.  Current Meds  Medication Sig   Alirocumab (PRALUENT) 150 MG/ML SOAJ Inject 1 pen into the skin every 14 (fourteen) days.   apixaban (ELIQUIS) 5 MG TABS tablet Take 1 tablet (5 mg total) by mouth 2 (two) times daily.   aspirin EC 81 MG tablet Take 1 tablet (81 mg total) by mouth daily.   carvedilol (COREG) 12.5 MG tablet Take 1 tablet (12.5 mg total) by mouth 2 (two) times daily.   colchicine 0.6 MG tablet  Take 1 tablet (0.6 mg total) by mouth daily as needed (Gout).   nitroGLYCERIN (NITROSTAT) 0.4 MG SL tablet Place 1 tablet (0.4 mg total) under the tongue every 5 (five) minutes as needed for chest pain.   oxyCODONE (OXY IR/ROXICODONE) 5 MG immediate release tablet Take 1-2 tablets (5-10 mg total) by mouth every 6 (six) hours as needed for severe pain or moderate pain.   sacubitril-valsartan (ENTRESTO) 49-51 MG Take 1 tablet by mouth 2 (two) times daily.   traZODone (DESYREL) 50 MG tablet TAKE 1 TABLET BY MOUTH AT BEDTIME AS NEEDED FOR SLEEP.    PHQ 2/9 Scores 10/14/2020 09/23/2020 09/23/2019 08/26/2019  PHQ - 2 Score 2 0 0 0  PHQ- 9 Score 2 - - 0    GAD 7 : Generalized Anxiety Score 10/14/2020 08/26/2019 05/21/2019  Nervous, Anxious, on Edge 0 0 0  Control/stop worrying 0 0 0  Worry too much - different things 0 0 0  Trouble relaxing 0 0 0  Restless 0 0 0  Easily annoyed or irritable 0 0 0  Afraid - awful might happen 0 0 0  Total GAD 7 Score 0 0 0  Anxiety Difficulty - Not difficult at all Not difficult at all    BP Readings from Last 3 Encounters:  10/14/20 122/82  10/07/20 97/61  10/07/20 (!) 71/47    Physical Exam Constitutional:      Appearance: Normal appearance.  Cardiovascular:     Rate and Rhythm: Normal rate and regular rhythm.  Pulmonary:     Effort: Pulmonary effort is normal.     Breath sounds: No wheezing or rhonchi.  Musculoskeletal:     Left foot: Swelling (of great toe) and tenderness present.  Skin:     Comments: Bilateral groin surgical sites intact without evidence of infection.   Neurological:     Mental Status: He is alert.    Wt Readings from Last 3 Encounters:  10/14/20 182 lb (82.6 kg)  10/07/20 176 lb 5.9 oz (80 kg)  10/07/20 177 lb (80.3 kg)    BP 122/82   Pulse 64   Temp 97.8 F (36.6 C) (Oral)   Ht '5\' 6"'$  (1.676 m)   Wt 182 lb (82.6 kg)   SpO2 97%   BMI 29.38 kg/m   Assessment and Plan: 1. Gouty arthritis of great toe Suspected - pt will make dietary changes Will obtain UA at [facility] in December Follow up if sx continue to recur Take colchicine for 5 days maximum each episode - colchicine 0.6 MG tablet; Take 1 tablet (0.6 mg total) by mouth 2 (two) times daily.  Dispense: 30 tablet; Refill: 0   Partially dictated using Editor, commissioning. Any errors are unintentional.  Halina Maidens, MD [facility]  10/14/2020

## 2020-10-16 ENCOUNTER — Ambulatory Visit (INDEPENDENT_AMBULATORY_CARE_PROVIDER_SITE_OTHER): Payer: PPO | Admitting: Nurse Practitioner

## 2020-10-16 ENCOUNTER — Encounter (INDEPENDENT_AMBULATORY_CARE_PROVIDER_SITE_OTHER): Payer: Self-pay | Admitting: Nurse Practitioner

## 2020-10-16 ENCOUNTER — Other Ambulatory Visit: Payer: Self-pay

## 2020-10-16 VITALS — BP 130/77 | HR 65 | Ht 66.0 in | Wt 181.0 lb

## 2020-10-16 DIAGNOSIS — I70229 Atherosclerosis of native arteries of extremities with rest pain, unspecified extremity: Secondary | ICD-10-CM

## 2020-10-16 MED ORDER — DOXYCYCLINE HYCLATE 100 MG PO CAPS: 100.0000 mg | ORAL_CAPSULE | Freq: Two times a day (BID) | ORAL | 0 refills | Status: DC

## 2020-10-16 MED ORDER — GABAPENTIN 300 MG PO CAPS: 300.0000 mg | ORAL_CAPSULE | Freq: Every day | ORAL | 0 refills | Status: DC

## 2020-10-18 ENCOUNTER — Encounter (INDEPENDENT_AMBULATORY_CARE_PROVIDER_SITE_OTHER): Payer: Self-pay | Admitting: Nurse Practitioner

## 2020-10-18 NOTE — Progress Notes (Signed)
Subjective:    Patient ID: [name], male    DOB: 12-18-1950, 70 y.o.   MRN: [id number] Chief Complaint  Patient presents with   Follow-up    2 wk [facility] post LE angio no studies     [name] presents today for follow-up after intervention including:  PROCEDURE: 1. Bilateral common femoral, superficial femoral and profunda femoris endarterectomy with Cormatrix patch angioplasty. 2. Left lower extremity angiography on table   Shortly after the patient's intervention he was hospitalized for dehydration.  They had a hypotensive episode however the blood pressures have been stable since that time otherwise has been doing well.  The groin sites are healing well with some increased redness surrounding.  The patient's biggest complaint is of a burning stinging sensation in his thigh area.  He has been walking as tolerated.  He also endorses having some leg swelling.  Overall however he is doing well.   Review of Systems  Cardiovascular:  Positive for leg swelling.  Musculoskeletal:  Positive for myalgias.  Skin:  Positive for wound.  All other systems reviewed and are negative.     Objective:   Physical Exam Vitals reviewed.  HENT:     Head: Normocephalic.  Cardiovascular:     Rate and Rhythm: Normal rate.  Pulmonary:     Effort: Pulmonary effort is normal.  Musculoskeletal:     Right lower leg: Edema present.     Left lower leg: Edema present.  Skin:    General: Skin is warm and dry.     Findings: Wound present.  Neurological:     Mental Status: He is alert and oriented to person, place, and time.  Psychiatric:        Mood and Affect: Mood normal.        Behavior: Behavior normal.        Thought Content: Thought content normal.        Judgment: Judgment normal.    BP 130/77   Pulse 65   Ht '5\' 6"'$  (1.676 m)   Wt 181 lb (82.1 kg)   BMI 29.21 kg/m   Past Medical History:  Diagnosis Date   Abnormal nuclear cardiac imaging test 08/08/2015   Arthritis    fingers    Carotid artery occlusion    CHF (congestive heart failure) (HCC)    Coronary atherosclerosis of native coronary artery 01/29/2013   11/05/19 R/LHC 80% dLMCA stenosis small diffusely dz dLAD, chronically occluded OM1 50%mRCA lesion, widely patent mLCx strent, moderately elevated L heart filling pressures, mild to moderate RH filling pressures, normal to moderately reduced CO   Duodenal erosion    Encounter for screening for lung cancer 07/13/2016   Esophageal stenosis    esophageal dilation   GERD (gastroesophageal reflux disease)    H. pylori infection    Heart attack ([place]) Oct. 2009   Mild   Hiatal hernia    Hyperlipidemia    Hypertension    Old myocardial infarction 11/29/2007   Mildly elevated troponin, isolated value in October 2009. Cardiac catheterization-nonobstructive 60% RCA disease-subsequent nuclear stress test-9 minutes, low risk, mild inferior wall hypokinesis    Pain in limb 12/19/2017   Peripheral vascular disease ([place])    Unstable angina ([place]) 11/25/2017    Social History   Socioeconomic History   Marital status: Married    Spouse name: Not on file   Number of children: 1   Years of education: Not on file   Highest education level: Bachelor's degree (  e.g., BA, AB, BS)  Occupational History   Not on file  Tobacco Use   Smoking status: Former    Packs/day: 1.25    Years: 35.00    Pack years: 43.75    Types: Cigarettes    Quit date: 02/28/2005    Years since quitting: 15.6   Smokeless tobacco: Current    Types: Snuff   Tobacco comments:    occaisionally  Vaping Use   Vaping Use: Never used  Substance and Sexual Activity   Alcohol use: Yes    Alcohol/week: 8.0 - 10.0 standard drinks    Types: 8 - 10 Glasses of wine per week    Comment: weekly   Drug use: No   Sexual activity: Yes  Other Topics Concern   Not on file  Social History Narrative   Not on file   Social Determinants of Health   Financial Resource Strain: Low Risk    Difficulty of Paying  Living Expenses: Not hard at all  Food Insecurity: No Food Insecurity   Worried About Charity fundraiser in the Last Year: Never true   Ran Out of Food in the Last Year: Never true  Transportation Needs: No Transportation Needs   Lack of Transportation (Medical): No   Lack of Transportation (Non-Medical): No  Physical Activity: Insufficiently Active   Days of Exercise per Week: 2 days   Minutes of Exercise per Session: 60 min  Stress: No Stress Concern Present   Feeling of Stress : Not at all  Social Connections: Moderately Integrated   Frequency of Communication with Friends and Family: More than three times a week   Frequency of Social Gatherings with Friends and Family: Three times a week   Attends Religious Services: Never   Active Member of Clubs or Organizations: Yes   Attends Music therapist: More than 4 times per year   Marital Status: Married  Human resources officer Violence: Not At Risk   Fear of Current or Ex-Partner: No   Emotionally Abused: No   Physically Abused: No   Sexually Abused: No    Past Surgical History:  Procedure Laterality Date   APPENDECTOMY     BACK SURGERY     BIV ICD INSERTION CRT-D N/A 03/23/2020   Procedure: BIV ICD INSERTION CRT-D;  Surgeon: Vickie Epley, MD;  Location: [place] [facility];  Service: Cardiovascular;  Laterality: N/A;   CARDIAC CATHETERIZATION N/A 08/07/2015   Procedure: Left Heart Cath and Coronary Angiography;  Surgeon: [name], MD;  Location: [place] [facility];  Service: Cardiovascular;  Laterality: N/A;   CARDIAC CATHETERIZATION N/A 08/07/2015   Procedure: Coronary Stent Intervention;  Surgeon: [name], MD;  Location: [place] [facility];  Service: Cardiovascular;  Laterality: N/A;   CARDIAC CATHETERIZATION N/A 08/07/2015   Procedure: Coronary Stent Intervention;  Surgeon: [name], MD;  Location: [place] [facility];  Service: Cardiovascular;  Laterality: N/A;   CAROTID ENDARTERECTOMY  01/05/2006    Right  CEA with DPA   CATARACT EXTRACTION W/ INTRAOCULAR LENS IMPLANT Left 12/04/2017   CATARACT EXTRACTION W/PHACO Left 12/04/2017   Procedure: CATARACT EXTRACTION PHACO AND INTRAOCULAR LENS PLACEMENT ([place]) LEFT;  Surgeon: Eulogio Bear, MD;  Location: [place];  Service: Ophthalmology;  Laterality: Left;   CATARACT EXTRACTION W/PHACO Right 02/06/2018   Procedure: CATARACT EXTRACTION PHACO AND INTRAOCULAR LENS PLACEMENT (IOC)RIGHT;  Surgeon: Eulogio Bear, MD;  Location: [place];  Service: Ophthalmology;  Laterality: Right;  COLONOSCOPY  05/20/2008   COLONOSCOPY WITH PROPOFOL N/A 09/13/2018   Procedure: COLONOSCOPY WITH BIOPSY;  Surgeon: [name], MD;  Location: [place];  Service: Endoscopy;  Laterality: N/A;   CORONARY ARTERY BYPASS GRAFT N/A 11/11/2019   Procedure: CORONARY ARTERY BYPASS GRAFTING (CABG) USING LIMA to Diag1; ENDOSCOPICALLY HARVESTED RIGHT GREATER SAPHENOUS VEIN: SVG to OM1; SVG to OM2; SVG to PDA.;  Surgeon: Ivin Poot, MD;  Location: [place];  Service: Open Heart Surgery;  Laterality: N/A;   CORONARY STENT PLACEMENT  08/07/2015   MID CIRCUMFLEX   ENDARTERECTOMY FEMORAL Bilateral 09/30/2020   Procedure: ENDARTERECTOMY FEMORAL;  Surgeon: Algernon Huxley, MD;  Location: [facility];  Service: Vascular;  Laterality: Bilateral;   ENDOVEIN HARVEST OF GREATER SAPHENOUS VEIN Right 11/11/2019   Procedure: ENDOVEIN HARVEST OF GREATER SAPHENOUS VEIN;  Surgeon: Ivin Poot, MD;  Location: [place];  Service: Open Heart Surgery;  Laterality: Right;   ESOPHAGOGASTRODUODENOSCOPY (EGD) WITH PROPOFOL N/A 02/11/2019   Procedure: ESOPHAGOGASTRODUODENOSCOPY (EGD) WITH BIOPSY and  Dilation;  Surgeon: [name], MD;  Location: [place];  Service: Endoscopy;  Laterality: N/A;   HIP SURGERY Left 10/2016   left hip tendon repair   LEFT HEART CATH AND CORONARY ANGIOGRAPHY N/A 11/27/2017   Procedure: LEFT HEART CATH AND CORONARY ANGIOGRAPHY;   Surgeon: Wellington Hampshire, MD;  Location: [place] [facility];  Service: Cardiovascular;  Laterality: N/A;   LOWER EXTREMITY ANGIOGRAPHY Left 02/12/2018   Procedure: LOWER EXTREMITY ANGIOGRAPHY;  Surgeon: Algernon Huxley, MD;  Location: [place] [facility];  Service: Cardiovascular;  Laterality: Left;   LOWER EXTREMITY ANGIOGRAPHY Left 03/07/2018   Procedure: LOWER EXTREMITY ANGIOGRAPHY;  Surgeon: Algernon Huxley, MD;  Location: [place] [facility];  Service: Cardiovascular;  Laterality: Left;   LOWER EXTREMITY ANGIOGRAPHY Left 06/04/2018   Procedure: LOWER EXTREMITY ANGIOGRAPHY;  Surgeon: Algernon Huxley, MD;  Location: [place] [facility];  Service: Cardiovascular;  Laterality: Left;   LOWER EXTREMITY ANGIOGRAPHY Left 09/17/2020   Procedure: LOWER EXTREMITY ANGIOGRAPHY;  Surgeon: Algernon Huxley, MD;  Location: [place] [facility];  Service: Cardiovascular;  Laterality: Left;   LOWER EXTREMITY ANGIOGRAPHY Left 09/28/2020   Procedure: LOWER EXTREMITY ANGIOGRAPHY;  Surgeon: Algernon Huxley, MD;  Location: [place] [facility];  Service: Cardiovascular;  Laterality: Left;   PLACEMENT OF IMPELLA LEFT VENTRICULAR ASSIST DEVICE N/A 11/11/2019   Procedure: PLACEMENT OF IMPELLA LEFT VENTRICULAR ASSIST DEVICE 5.5;  Surgeon: Ivin Poot, MD;  Location: [place];  Service: Open Heart Surgery;  Laterality: N/A;  Midline Sternotomy   POLYPECTOMY N/A 09/13/2018   Procedure: POLYPECTOMY;  Surgeon: [name], MD;  Location: [place];  Service: Endoscopy;  Laterality: N/A;   POLYPECTOMY N/A 02/11/2019   Procedure: POLYPECTOMY;  Surgeon: [name], MD;  Location: [place];  Service: Endoscopy;  Laterality: N/A;   REMOVAL OF IMPELLA LEFT VENTRICULAR ASSIST DEVICE N/A 11/15/2019   Procedure: REMOVAL OF IMPELLA 5.5 LEFT VENTRICULAR ASSIST DEVICE;  Surgeon: Ivin Poot, MD;  Location: [place];  Service: Open Heart Surgery;  Laterality: N/A;   RIGHT/LEFT HEART CATH AND CORONARY ANGIOGRAPHY N/A 11/05/2019    Procedure: RIGHT/LEFT HEART CATH AND CORONARY ANGIOGRAPHY;  Surgeon: Nelva Bush, MD;  Location: [place] [facility];  Service: Cardiovascular;  Laterality: N/A;   SPINE SURGERY     TEE WITHOUT CARDIOVERSION N/A 11/11/2019   Procedure: TRANSESOPHAGEAL ECHOCARDIOGRAM (TEE);  Surgeon: Prescott Gum, Collier Salina, MD;  Location: [place];  Service:  Open Heart Surgery;  Laterality: N/A;   TEE WITHOUT CARDIOVERSION N/A 11/15/2019   Procedure: TRANSESOPHAGEAL ECHOCARDIOGRAM (TEE);  Surgeon: Prescott Gum, Collier Salina, MD;  Location: [place];  Service: Open Heart Surgery;  Laterality: N/A;   TONSILLECTOMY      Family History  Problem Relation Age of Onset   Heart attack Mother    Coronary artery disease Mother    Heart disease Mother        Carotid Stenosis and BPG and Heart Disease before age 49   Diabetes Mother    Hypertension Mother    Heart attack Father    Heart disease Father        BPG and Heart Disease before age 38   Hypertension Father    Cancer Father 78       throat   Stroke Father    Colon cancer Neg Hx    Colon polyps Neg Hx    Esophageal cancer Neg Hx    Rectal cancer Neg Hx    Stomach cancer Neg Hx     Allergies  Allergen Reactions   Brilinta [Ticagrelor] Shortness Of Breath   Chlorhexidine Gluconate Other (See Comments)    Skin burning for hours afterward   Contrast Media [Iodinated Diagnostic Agents] Itching    Face and head flushing, nose itching after contrast administation for angiogram   Statins Other (See Comments)    Failed Crestor 5 mg twice weekly, Crestor 20 mg daily, Pravastatin 40 mg qd, Lipitor, Zocor - muscle aches   Zetia [Ezetimibe] Other (See Comments)    Muscle aches    CBC Latest Ref Rng & Units 10/07/2020 10/02/2020 10/01/2020  WBC 4.0 - 10.5 K/uL 5.1 6.7 7.6  Hemoglobin 13.0 - 17.0 g/dL 11.5(L) 11.2(L) 11.9(L)  Hematocrit 39.0 - 52.0 % 34.4(L) 34.3(L) 35.9(L)  Platelets 150 - 400 K/uL 218 130(L) 150      CMP     Component Value Date/Time   NA 137  10/07/2020 1030   NA 141 06/29/2020 0906   K 4.6 10/07/2020 1030   CL 98 10/07/2020 1030   CO2 27 10/07/2020 1030   GLUCOSE 109 (H) 10/07/2020 1030   BUN 30 (H) 10/07/2020 1030   BUN 42 (H) 06/29/2020 0906   CREATININE 1.39 (H) 10/07/2020 1030   CREATININE 1.12 07/23/2015 1016   CALCIUM 8.6 (L) 10/07/2020 1030   PROT 6.8 10/07/2020 1030   PROT 7.3 07/24/2020 0856   ALBUMIN 3.0 (L) 10/07/2020 1030   ALBUMIN 4.5 07/24/2020 0856   AST 19 10/07/2020 1030   ALT 21 10/07/2020 1030   ALKPHOS 68 10/07/2020 1030   BILITOT 1.0 10/07/2020 1030   BILITOT 0.4 07/24/2020 0856   GFRNONAA 55 (L) 10/07/2020 1030   GFRAA 85 04/22/2020 1013     VAS [place] ABI WITH/WO TBI  Result Date: 09/22/2020  LOWER EXTREMITY DOPPLER STUDY Patient Name:  KRISTAN NILSSON  Date of Exam:   09/15/2020 Medical Rec #: [id number]     Accession #:    [id number] Date of Birth: 1950-10-31    Patient Gender: M Patient Age:   69Y Exam Location:  [facility] Procedure:      VAS [place] ABI WITH/WO TBI Referring Phys: [id number] Kris Hartmann --------------------------------------------------------------------------------  Indications: Rest pain, peripheral artery disease, and No complaints at this              time              06/04/2018 Left SFA to  TP trunk thrombectomy, and stent.  Vascular Interventions: 03/07/2018 PTA left distal SFA and popliteal artery. Stent                         in the left popliteal artery. Comparison Study: 07/21/2020 Performing Technologist: Almira Coaster RVS  Examination Guidelines: A complete evaluation includes at minimum, Doppler waveform signals and systolic blood pressure reading at the level of bilateral brachial, anterior tibial, and posterior tibial arteries, when vessel segments are accessible. Bilateral testing is considered an integral part of a complete examination. Photoelectric Plethysmograph (PPG) waveforms and toe systolic pressure readings are included as required and additional duplex  testing as needed. Limited examinations for reoccurring indications may be performed as noted.  ABI Findings: +---------+------------------+-----+--------+--------+ Right    Rt Pressure (mmHg)IndexWaveformComment  +---------+------------------+-----+--------+--------+ Brachial 143                                     +---------+------------------+-----+--------+--------+ ATA      165               1.15                  +---------+------------------+-----+--------+--------+ PTA      177               1.24                  +---------+------------------+-----+--------+--------+ Great Toe137               0.96                  +---------+------------------+-----+--------+--------+ +---------+------------------+-----+--------+-------+ Left     Lt Pressure (mmHg)IndexWaveformComment +---------+------------------+-----+--------+-------+ Brachial 139                                    +---------+------------------+-----+--------+-------+ ATA      94                0.66                 +---------+------------------+-----+--------+-------+ PTA      109               0.76                 +---------+------------------+-----+--------+-------+ Great Toe0                 0.00 Absent          +---------+------------------+-----+--------+-------+ +-------+-----------+-----------+------------+------------+ ABI/TBIToday's ABIToday's TBIPrevious ABIPrevious TBI +-------+-----------+-----------+------------+------------+ Right  1.24       .96        1.15        .56          +-------+-----------+-----------+------------+------------+ Left   .76        0.0        1.09        .66          +-------+-----------+-----------+------------+------------+ Right ABIs and TBIs appear increased compared to prior study on 07/21/2020. Left TBIs and ABIs appear decreased compared to prior study on 07/21/2020.  Summary: Right: Resting right ankle-brachial index is within normal  range. No evidence of significant right lower extremity arterial disease. The right toe-brachial index is normal. Left: Resting left ankle-brachial index indicates moderate left lower extremity arterial disease. The left toe-brachial  index is abnormal.  *See table(s) above for measurements and observations.  Electronically signed by [name] MD on 09/22/2020 at 5:03:40 PM.    Final        Assessment & Plan:   1. Atherosclerosis of artery of extremity with rest pain ([place]) Overall patient is doing well postsurgery.  We will do some antibiotics because of the redness near the groin areas.  The patient also continues to have some significant pain that is very likely related to nerve pain.  We will try gabapentin during the evenings to see if this offers some relief for him.  We will have the patient return in 2 weeks for evaluation of wound as well as noninvasive studies.   Current Outpatient Medications on File Prior to Visit  Medication Sig Dispense Refill   Alirocumab (PRALUENT) 150 MG/ML SOAJ Inject 1 pen into the skin every 14 (fourteen) days. 2 mL 2   apixaban (ELIQUIS) 5 MG TABS tablet Take 1 tablet (5 mg total) by mouth 2 (two) times daily. 180 tablet 1   aspirin EC 81 MG tablet Take 1 tablet (81 mg total) by mouth daily. 150 tablet 2   carvedilol (COREG) 12.5 MG tablet Take 1 tablet (12.5 mg total) by mouth 2 (two) times daily. 180 tablet 3   colchicine 0.6 MG tablet Take 1 tablet (0.6 mg total) by mouth 2 (two) times daily. 30 tablet 0   nitroGLYCERIN (NITROSTAT) 0.4 MG SL tablet Place 1 tablet (0.4 mg total) under the tongue every 5 (five) minutes as needed for chest pain. 25 tablet 2   oxyCODONE (OXY IR/ROXICODONE) 5 MG immediate release tablet Take 1-2 tablets (5-10 mg total) by mouth every 6 (six) hours as needed for severe pain or moderate pain. 50 tablet 0   sacubitril-valsartan (ENTRESTO) 49-51 MG Take 1 tablet by mouth 2 (two) times daily. 180 tablet 3   traZODone (DESYREL) 50 MG  tablet TAKE 1 TABLET BY MOUTH AT BEDTIME AS NEEDED FOR SLEEP. 90 tablet 1   No current facility-administered medications on file prior to visit.    There are no Patient Instructions on file for this visit. No follow-ups on file.   Kris Hartmann, NP

## 2020-10-19 NOTE — Progress Notes (Signed)
Remote ICD transmission.   

## 2020-10-21 ENCOUNTER — Other Ambulatory Visit: Payer: Self-pay | Admitting: Internal Medicine

## 2020-10-21 DIAGNOSIS — M109 Gout, unspecified: Secondary | ICD-10-CM

## 2020-10-21 NOTE — Telephone Encounter (Signed)
Please review. Is pt supposed to take daily or as needed.  KP

## 2020-10-21 NOTE — Telephone Encounter (Signed)
Requested medication (s) are due for refill today - no  Requested medication (s) are on the active medication list -yes  Future visit scheduled -yes  Last refill: 10/14/20  Notes to clinic: Request RF: last RF 10/14/20- 15 day supply- sent for review of request   Requested Prescriptions  Pending Prescriptions Disp Refills   colchicine 0.6 MG tablet [Pharmacy Med Name: COLCHICINE 0.6 MG TABLET] 30 tablet 0    Sig: TAKE 1 TABLET BY MOUTH 2 TIMES DAILY.     Endocrinology:  Gout Agents Failed - 10/21/2020  2:30 PM      Failed - Uric Acid in normal range and within 360 days    No results found for: POCURA, LABURIC        Failed - Cr in normal range and within 360 days    Creat  Date Value Ref Range Status  07/23/2015 1.12 0.70 - 1.25 mg/dL Final   Creatinine, Ser  Date Value Ref Range Status  10/07/2020 1.39 (H) 0.61 - 1.24 mg/dL Final          Passed - Valid encounter within last 12 months    Recent Outpatient Visits           1 week ago Gouty arthritis of great toe   [place], MD   1 year ago Impacted cerumen of left ear   [facility] Glean Hess, MD   1 year ago Annual physical exam   [facility] Glean Hess, MD   2 years ago Annual physical exam   [facility] Glean Hess, MD   2 years ago Essential hypertension   [place], MD       Future Appointments             In 4 weeks End, Harrell Gave, MD [facility], [facility]   In 4 months Army Melia, Jesse Sans, MD [facility], [facility]               Requested Prescriptions  Pending Prescriptions Disp Refills   colchicine 0.6 MG tablet [Pharmacy Med Name: COLCHICINE 0.6 MG TABLET] 30 tablet 0    Sig: TAKE 1 TABLET BY MOUTH 2 TIMES DAILY.     Endocrinology:  Gout Agents Failed - 10/21/2020  2:30 PM      Failed - Uric Acid in normal range and within 360 days    No results found for:  POCURA, LABURIC        Failed - Cr in normal range and within 360 days    Creat  Date Value Ref Range Status  07/23/2015 1.12 0.70 - 1.25 mg/dL Final   Creatinine, Ser  Date Value Ref Range Status  10/07/2020 1.39 (H) 0.61 - 1.24 mg/dL Final          Passed - Valid encounter within last 12 months    Recent Outpatient Visits           1 week ago Gouty arthritis of great toe   [facility] Glean Hess, MD   1 year ago Impacted cerumen of left ear   [facility] Glean Hess, MD   1 year ago Annual physical exam   [facility] Glean Hess, MD   2 years ago Annual physical exam   [facility] Glean Hess, MD   2 years ago Essential hypertension   [place],  MD       Future Appointments             In 4 weeks End, Harrell Gave, MD [facility], [facility]   In 4 months Army Melia, Jesse Sans, MD [facility], [facility]

## 2020-10-26 DIAGNOSIS — Z872 Personal history of diseases of the skin and subcutaneous tissue: Secondary | ICD-10-CM | POA: Diagnosis not present

## 2020-10-26 DIAGNOSIS — L578 Other skin changes due to chronic exposure to nonionizing radiation: Secondary | ICD-10-CM | POA: Diagnosis not present

## 2020-10-28 ENCOUNTER — Other Ambulatory Visit (HOSPITAL_COMMUNITY): Payer: Self-pay

## 2020-10-28 MED ORDER — SACUBITRIL-VALSARTAN 49-51 MG PO TABS: 1.0000 | ORAL_TABLET | Freq: Two times a day (BID) | ORAL | 0 refills | Status: DC

## 2020-10-29 ENCOUNTER — Other Ambulatory Visit (INDEPENDENT_AMBULATORY_CARE_PROVIDER_SITE_OTHER): Payer: Self-pay | Admitting: Nurse Practitioner

## 2020-10-29 DIAGNOSIS — Z9889 Other specified postprocedural states: Secondary | ICD-10-CM

## 2020-10-29 DIAGNOSIS — I739 Peripheral vascular disease, unspecified: Secondary | ICD-10-CM

## 2020-10-30 ENCOUNTER — Ambulatory Visit (INDEPENDENT_AMBULATORY_CARE_PROVIDER_SITE_OTHER): Payer: PPO | Admitting: Nurse Practitioner

## 2020-10-30 ENCOUNTER — Other Ambulatory Visit: Payer: Self-pay

## 2020-10-30 ENCOUNTER — Ambulatory Visit (INDEPENDENT_AMBULATORY_CARE_PROVIDER_SITE_OTHER): Payer: PPO

## 2020-10-30 ENCOUNTER — Encounter (INDEPENDENT_AMBULATORY_CARE_PROVIDER_SITE_OTHER): Payer: Self-pay | Admitting: Nurse Practitioner

## 2020-10-30 VITALS — BP 115/77 | HR 48 | Ht 66.0 in | Wt 188.0 lb

## 2020-10-30 DIAGNOSIS — I70229 Atherosclerosis of native arteries of extremities with rest pain, unspecified extremity: Secondary | ICD-10-CM

## 2020-10-30 DIAGNOSIS — I739 Peripheral vascular disease, unspecified: Secondary | ICD-10-CM | POA: Diagnosis not present

## 2020-10-30 DIAGNOSIS — Z9889 Other specified postprocedural states: Secondary | ICD-10-CM

## 2020-10-30 MED ORDER — SILVER SULFADIAZINE 1 % EX CREA: 1.0000 "application " | TOPICAL_CREAM | Freq: Every day | CUTANEOUS | 0 refills | Status: DC

## 2020-11-02 ENCOUNTER — Encounter (INDEPENDENT_AMBULATORY_CARE_PROVIDER_SITE_OTHER): Payer: Self-pay | Admitting: Nurse Practitioner

## 2020-11-02 NOTE — Progress Notes (Addendum)
Subjective:    Patient ID: [name], male    DOB: Feb 13, 1951, 70 y.o.   MRN: [id number] Chief Complaint  Patient presents with   Follow-up    2 wk BIL arterial ABI wound check     [name] presents today for follow-up after intervention including:   PROCEDURE: 1.         Bilateral common femoral, superficial femoral and profunda femoris endarterectomy with Cormatrix patch angioplasty. 2.         Left lower extremity angiography on table   The patient returns for wound evaluation.  He still has burning and stinging sensation in his thigh area.  He still also has some leg swelling but it does appear to be decreased.  The right groin wound sites are healing well but the left groin has some slight dehiscence near the distal portion.  Otherwise the patient is doing well postsurgery.  Today noninvasive studies show an ABI of 1.17 on the right and 1.16 on the left.  The patient has a TBI 0.55 on the right and 0.43 on the left.  The patient has biphasic tibial artery waveforms on the left and monophasic tibial artery waveforms on the left.  Lower extremity arterial duplex shows the right has mostly biphasic but there are some monophasic waveforms in the distal common femoral and profunda arteries.  The left lower extremity has mostly biphasic waveforms however the distal SFA has noted thready flow with monophasic waveforms at the distal peroneal artery.  There is also a groin fluid collection measuring 6.4 cm x 3.78 cm x 4.84 cm.   Review of Systems  Cardiovascular:  Positive for leg swelling.  Skin:  Positive for wound.  Neurological:  Positive for numbness.      Objective:   Physical Exam Vitals reviewed.  HENT:     Head: Normocephalic.  Cardiovascular:     Rate and Rhythm: Normal rate.  Pulmonary:     Effort: Pulmonary effort is normal.  Skin:    General: Skin is warm and dry.  Neurological:     Mental Status: He is alert and oriented to person, place, and time.   Psychiatric:        Mood and Affect: Mood normal.        Behavior: Behavior normal.        Thought Content: Thought content normal.        Judgment: Judgment normal.    BP 115/77   Pulse (!) 48   Ht '5\' 6"'$  (1.676 m)   Wt 188 lb (85.3 kg)   BMI 30.34 kg/m   Past Medical History:  Diagnosis Date   Abnormal nuclear cardiac imaging test 08/08/2015   Arthritis    fingers   Carotid artery occlusion    CHF (congestive heart failure) (HCC)    Coronary atherosclerosis of native coronary artery 01/29/2013   11/05/19 R/LHC 80% dLMCA stenosis small diffusely dz dLAD, chronically occluded OM1 50%mRCA lesion, widely patent mLCx strent, moderately elevated L heart filling pressures, mild to moderate RH filling pressures, normal to moderately reduced CO   Duodenal erosion    Encounter for screening for lung cancer 07/13/2016   Esophageal stenosis    esophageal dilation   GERD (gastroesophageal reflux disease)    H. pylori infection    Heart attack ([place]) Oct. 2009   Mild   Hiatal hernia    Hyperlipidemia    Hypertension    Old myocardial infarction 11/29/2007   Mildly elevated troponin,  isolated value in October 2009. Cardiac catheterization-nonobstructive 60% RCA disease-subsequent nuclear stress test-9 minutes, low risk, mild inferior wall hypokinesis    Pain in limb 12/19/2017   Peripheral vascular disease ([place])    Unstable angina ([place]) 11/25/2017    Social History   Socioeconomic History   Marital status: Married    Spouse name: Not on file   Number of children: 1   Years of education: Not on file   Highest education level: Bachelor's degree (e.g., BA, AB, BS)  Occupational History   Not on file  Tobacco Use   Smoking status: Former    Packs/day: 1.25    Years: 35.00    Pack years: 43.75    Types: Cigarettes    Quit date: 02/28/2005    Years since quitting: 15.6   Smokeless tobacco: Current    Types: Snuff   Tobacco comments:    occaisionally  Vaping Use   Vaping Use:  Never used  Substance and Sexual Activity   Alcohol use: Yes    Alcohol/week: 8.0 - 10.0 standard drinks    Types: 8 - 10 Glasses of wine per week    Comment: weekly   Drug use: No   Sexual activity: Yes  Other Topics Concern   Not on file  Social History Narrative   Not on file   Social Determinants of Health   Financial Resource Strain: Low Risk    Difficulty of Paying Living Expenses: Not hard at all  Food Insecurity: No Food Insecurity   Worried About Charity fundraiser in the Last Year: Never true   Ran Out of Food in the Last Year: Never true  Transportation Needs: No Transportation Needs   Lack of Transportation (Medical): No   Lack of Transportation (Non-Medical): No  Physical Activity: Insufficiently Active   Days of Exercise per Week: 2 days   Minutes of Exercise per Session: 60 min  Stress: No Stress Concern Present   Feeling of Stress : Not at all  Social Connections: Moderately Integrated   Frequency of Communication with Friends and Family: More than three times a week   Frequency of Social Gatherings with Friends and Family: Three times a week   Attends Religious Services: Never   Active Member of Clubs or Organizations: Yes   Attends Music therapist: More than 4 times per year   Marital Status: Married  Human resources officer Violence: Not At Risk   Fear of Current or Ex-Partner: No   Emotionally Abused: No   Physically Abused: No   Sexually Abused: No    Past Surgical History:  Procedure Laterality Date   APPENDECTOMY     BACK SURGERY     BIV ICD INSERTION CRT-D N/A 03/23/2020   Procedure: BIV ICD INSERTION CRT-D;  Surgeon: Vickie Epley, MD;  Location: [place] [facility];  Service: Cardiovascular;  Laterality: N/A;   CARDIAC CATHETERIZATION N/A 08/07/2015   Procedure: Left Heart Cath and Coronary Angiography;  Surgeon: [name], MD;  Location: [place] [facility];  Service: Cardiovascular;  Laterality: N/A;   CARDIAC CATHETERIZATION  N/A 08/07/2015   Procedure: Coronary Stent Intervention;  Surgeon: [name], MD;  Location: [place] [facility];  Service: Cardiovascular;  Laterality: N/A;   CARDIAC CATHETERIZATION N/A 08/07/2015   Procedure: Coronary Stent Intervention;  Surgeon: [name], MD;  Location: [place] [facility];  Service: Cardiovascular;  Laterality: N/A;   CAROTID ENDARTERECTOMY  01/05/2006   Right  CEA  with DPA   CATARACT EXTRACTION W/ INTRAOCULAR LENS IMPLANT Left 12/04/2017   CATARACT EXTRACTION W/PHACO Left 12/04/2017   Procedure: CATARACT EXTRACTION PHACO AND INTRAOCULAR LENS PLACEMENT (Equality) LEFT;  Surgeon: Eulogio Bear, MD;  Location: [place];  Service: Ophthalmology;  Laterality: Left;   CATARACT EXTRACTION W/PHACO Right 02/06/2018   Procedure: CATARACT EXTRACTION PHACO AND INTRAOCULAR LENS PLACEMENT (IOC)RIGHT;  Surgeon: Eulogio Bear, MD;  Location: [facility];  Service: Ophthalmology;  Laterality: Right;   COLONOSCOPY  05/20/2008   COLONOSCOPY WITH PROPOFOL N/A 09/13/2018   Procedure: COLONOSCOPY WITH BIOPSY;  Surgeon: [name], MD;  Location: [place];  Service: Endoscopy;  Laterality: N/A;   CORONARY ARTERY BYPASS GRAFT N/A 11/11/2019   Procedure: CORONARY ARTERY BYPASS GRAFTING (CABG) USING LIMA to Diag1; ENDOSCOPICALLY HARVESTED RIGHT GREATER SAPHENOUS VEIN: SVG to OM1; SVG to OM2; SVG to PDA.;  Surgeon: Ivin Poot, MD;  Location: [place];  Service: Open Heart Surgery;  Laterality: N/A;   CORONARY STENT PLACEMENT  08/07/2015   MID CIRCUMFLEX   ENDARTERECTOMY FEMORAL Bilateral 09/30/2020   Procedure: ENDARTERECTOMY FEMORAL;  Surgeon: Algernon Huxley, MD;  Location: [facility];  Service: Vascular;  Laterality: Bilateral;   ENDOVEIN HARVEST OF GREATER SAPHENOUS VEIN Right 11/11/2019   Procedure: ENDOVEIN HARVEST OF GREATER SAPHENOUS VEIN;  Surgeon: Ivin Poot, MD;  Location: [place];  Service: Open Heart Surgery;  Laterality: Right;    ESOPHAGOGASTRODUODENOSCOPY (EGD) WITH PROPOFOL N/A 02/11/2019   Procedure: ESOPHAGOGASTRODUODENOSCOPY (EGD) WITH BIOPSY and  Dilation;  Surgeon: [name], MD;  Location: [place];  Service: Endoscopy;  Laterality: N/A;   HIP SURGERY Left 10/2016   left hip tendon repair   LEFT HEART CATH AND CORONARY ANGIOGRAPHY N/A 11/27/2017   Procedure: LEFT HEART CATH AND CORONARY ANGIOGRAPHY;  Surgeon: Wellington Hampshire, MD;  Location: [place] [facility];  Service: Cardiovascular;  Laterality: N/A;   LOWER EXTREMITY ANGIOGRAPHY Left 02/12/2018   Procedure: LOWER EXTREMITY ANGIOGRAPHY;  Surgeon: Algernon Huxley, MD;  Location: [place] [facility];  Service: Cardiovascular;  Laterality: Left;   LOWER EXTREMITY ANGIOGRAPHY Left 03/07/2018   Procedure: LOWER EXTREMITY ANGIOGRAPHY;  Surgeon: Algernon Huxley, MD;  Location: [place] [facility];  Service: Cardiovascular;  Laterality: Left;   LOWER EXTREMITY ANGIOGRAPHY Left 06/04/2018   Procedure: LOWER EXTREMITY ANGIOGRAPHY;  Surgeon: Algernon Huxley, MD;  Location: [place] [facility];  Service: Cardiovascular;  Laterality: Left;   LOWER EXTREMITY ANGIOGRAPHY Left 09/17/2020   Procedure: LOWER EXTREMITY ANGIOGRAPHY;  Surgeon: Algernon Huxley, MD;  Location: [place] [facility];  Service: Cardiovascular;  Laterality: Left;   LOWER EXTREMITY ANGIOGRAPHY Left 09/28/2020   Procedure: LOWER EXTREMITY ANGIOGRAPHY;  Surgeon: Algernon Huxley, MD;  Location: [place] [facility];  Service: Cardiovascular;  Laterality: Left;   PLACEMENT OF IMPELLA LEFT VENTRICULAR ASSIST DEVICE N/A 11/11/2019   Procedure: PLACEMENT OF IMPELLA LEFT VENTRICULAR ASSIST DEVICE 5.5;  Surgeon: Ivin Poot, MD;  Location: [place];  Service: Open Heart Surgery;  Laterality: N/A;  Midline Sternotomy   POLYPECTOMY N/A 09/13/2018   Procedure: POLYPECTOMY;  Surgeon: [name], MD;  Location: [place];  Service: Endoscopy;  Laterality: N/A;   POLYPECTOMY N/A 02/11/2019   Procedure:  POLYPECTOMY;  Surgeon: [name], MD;  Location: [place];  Service: Endoscopy;  Laterality: N/A;   REMOVAL OF IMPELLA LEFT VENTRICULAR ASSIST DEVICE N/A 11/15/2019   Procedure: REMOVAL OF IMPELLA 5.5 LEFT VENTRICULAR ASSIST DEVICE;  Surgeon: Prescott Gum, Collier Salina, MD;  Location: [place];  Service: Open Heart Surgery;  Laterality: N/A;   RIGHT/LEFT HEART CATH AND CORONARY ANGIOGRAPHY N/A 11/05/2019   Procedure: RIGHT/LEFT HEART CATH AND CORONARY ANGIOGRAPHY;  Surgeon: Nelva Bush, MD;  Location: [place] [facility];  Service: Cardiovascular;  Laterality: N/A;   SPINE SURGERY     TEE WITHOUT CARDIOVERSION N/A 11/11/2019   Procedure: TRANSESOPHAGEAL ECHOCARDIOGRAM (TEE);  Surgeon: Prescott Gum, Collier Salina, MD;  Location: [place];  Service: Open Heart Surgery;  Laterality: N/A;   TEE WITHOUT CARDIOVERSION N/A 11/15/2019   Procedure: TRANSESOPHAGEAL ECHOCARDIOGRAM (TEE);  Surgeon: Prescott Gum, Collier Salina, MD;  Location: [place];  Service: Open Heart Surgery;  Laterality: N/A;   TONSILLECTOMY      Family History  Problem Relation Age of Onset   Heart attack Mother    Coronary artery disease Mother    Heart disease Mother        Carotid Stenosis and BPG and Heart Disease before age 54   Diabetes Mother    Hypertension Mother    Heart attack Father    Heart disease Father        BPG and Heart Disease before age 74   Hypertension Father    Cancer Father 58       throat   Stroke Father    Colon cancer Neg Hx    Colon polyps Neg Hx    Esophageal cancer Neg Hx    Rectal cancer Neg Hx    Stomach cancer Neg Hx     Allergies  Allergen Reactions   Brilinta [Ticagrelor] Shortness Of Breath   Chlorhexidine Gluconate Other (See Comments)    Skin burning for hours afterward   Contrast Media [Iodinated Diagnostic Agents] Itching    Face and head flushing, nose itching after contrast administation for angiogram   Statins Other (See Comments)    Failed Crestor 5 mg twice weekly, Crestor 20 mg daily,  Pravastatin 40 mg qd, Lipitor, Zocor - muscle aches   Zetia [Ezetimibe] Other (See Comments)    Muscle aches    CBC Latest Ref Rng & Units 10/07/2020 10/02/2020 10/01/2020  WBC 4.0 - 10.5 K/uL 5.1 6.7 7.6  Hemoglobin 13.0 - 17.0 g/dL 11.5(L) 11.2(L) 11.9(L)  Hematocrit 39.0 - 52.0 % 34.4(L) 34.3(L) 35.9(L)  Platelets 150 - 400 K/uL 218 130(L) 150      CMP     Component Value Date/Time   NA 137 10/07/2020 1030   NA 141 06/29/2020 0906   K 4.6 10/07/2020 1030   CL 98 10/07/2020 1030   CO2 27 10/07/2020 1030   GLUCOSE 109 (H) 10/07/2020 1030   BUN 30 (H) 10/07/2020 1030   BUN 42 (H) 06/29/2020 0906   CREATININE 1.39 (H) 10/07/2020 1030   CREATININE 1.12 07/23/2015 1016   CALCIUM 8.6 (L) 10/07/2020 1030   PROT 6.8 10/07/2020 1030   PROT 7.3 07/24/2020 0856   ALBUMIN 3.0 (L) 10/07/2020 1030   ALBUMIN 4.5 07/24/2020 0856   AST 19 10/07/2020 1030   ALT 21 10/07/2020 1030   ALKPHOS 68 10/07/2020 1030   BILITOT 1.0 10/07/2020 1030   BILITOT 0.4 07/24/2020 0856   GFRNONAA 55 (L) 10/07/2020 1030   GFRAA 85 04/22/2020 1013     VAS [place] ABI WITH/WO TBI  Result Date: 09/22/2020  LOWER EXTREMITY DOPPLER STUDY Patient Name:  [name]  Date of Exam:   09/15/2020 Medical Rec #: [id number]     Accession #:  [id number] Date of Birth: 1951-02-19    Patient Gender: M Patient Age:   69Y Exam Location:  [facility] Procedure:      VAS [place] ABI WITH/WO TBI Referring Phys: [id number] Kris Hartmann --------------------------------------------------------------------------------  Indications: Rest pain, peripheral artery disease, and No complaints at this              time              06/04/2018 Left SFA to TP trunk thrombectomy, and stent.  Vascular Interventions: 03/07/2018 PTA left distal SFA and popliteal artery. Stent                         in the left popliteal artery. Comparison Study: 07/21/2020 Performing Technologist: Almira Coaster RVS  Examination Guidelines: A complete evaluation  includes at minimum, Doppler waveform signals and systolic blood pressure reading at the level of bilateral brachial, anterior tibial, and posterior tibial arteries, when vessel segments are accessible. Bilateral testing is considered an integral part of a complete examination. Photoelectric Plethysmograph (PPG) waveforms and toe systolic pressure readings are included as required and additional duplex testing as needed. Limited examinations for reoccurring indications may be performed as noted.  ABI Findings: +---------+------------------+-----+--------+--------+ Right    Rt Pressure (mmHg)IndexWaveformComment  +---------+------------------+-----+--------+--------+ Brachial 143                                     +---------+------------------+-----+--------+--------+ ATA      165               1.15                  +---------+------------------+-----+--------+--------+ PTA      177               1.24                  +---------+------------------+-----+--------+--------+ Great Toe137               0.96                  +---------+------------------+-----+--------+--------+ +---------+------------------+-----+--------+-------+ Left     Lt Pressure (mmHg)IndexWaveformComment +---------+------------------+-----+--------+-------+ Brachial 139                                    +---------+------------------+-----+--------+-------+ ATA      94                0.66                 +---------+------------------+-----+--------+-------+ PTA      109               0.76                 +---------+------------------+-----+--------+-------+ Great Toe0                 0.00 Absent          +---------+------------------+-----+--------+-------+ +-------+-----------+-----------+------------+------------+ ABI/TBIToday's ABIToday's TBIPrevious ABIPrevious TBI +-------+-----------+-----------+------------+------------+ Right  1.24       .96        1.15        .56           +-------+-----------+-----------+------------+------------+ Left   .76        0.0  1.09        .66          +-------+-----------+-----------+------------+------------+ Right ABIs and TBIs appear increased compared to prior study on 07/21/2020. Left TBIs and ABIs appear decreased compared to prior study on 07/21/2020.  Summary: Right: Resting right ankle-brachial index is within normal range. No evidence of significant right lower extremity arterial disease. The right toe-brachial index is normal. Left: Resting left ankle-brachial index indicates moderate left lower extremity arterial disease. The left toe-brachial index is abnormal.  *See table(s) above for measurements and observations.  Electronically signed by [name] MD on 09/22/2020 at 5:03:40 PM.    Final        Assessment & Plan:   1. Atherosclerosis of artery of extremity with rest pain [facility]) The patient has some slight opening near the distal portion of his left groin.  We will have the patient placed Aquacel in his area.  We will also have sulfa Silvadene placed on the areas that are only superficially open.  We will have the patient return to the office in 4 weeks to evaluate wound progression.   Current Outpatient Medications on File Prior to Visit  Medication Sig Dispense Refill   Alirocumab (PRALUENT) 150 MG/ML SOAJ Inject 1 pen into the skin every 14 (fourteen) days. 2 mL 2   apixaban (ELIQUIS) 5 MG TABS tablet Take 1 tablet (5 mg total) by mouth 2 (two) times daily. 180 tablet 1   aspirin EC 81 MG tablet Take 1 tablet (81 mg total) by mouth daily. 150 tablet 2   carvedilol (COREG) 12.5 MG tablet Take 1 tablet (12.5 mg total) by mouth 2 (two) times daily. 180 tablet 3   colchicine 0.6 MG tablet Take 1 tablet (0.6 mg total) by mouth 2 (two) times daily. 30 tablet 0   doxycycline (VIBRAMYCIN) 100 MG capsule Take 1 capsule (100 mg total) by mouth 2 (two) times daily. 20 capsule 0   gabapentin (NEURONTIN) 300 MG capsule  Take 1 capsule (300 mg total) by mouth at bedtime. 30 capsule 0   nitroGLYCERIN (NITROSTAT) 0.4 MG SL tablet Place 1 tablet (0.4 mg total) under the tongue every 5 (five) minutes as needed for chest pain. 25 tablet 2   oxyCODONE (OXY IR/ROXICODONE) 5 MG immediate release tablet Take 1-2 tablets (5-10 mg total) by mouth every 6 (six) hours as needed for severe pain or moderate pain. 50 tablet 0   sacubitril-valsartan (ENTRESTO) 49-51 MG Take 1 tablet by mouth 2 (two) times daily. Need to make an appointment for further refills 60 tablet 0   traZODone (DESYREL) 50 MG tablet TAKE 1 TABLET BY MOUTH AT BEDTIME AS NEEDED FOR SLEEP. 90 tablet 1   No current facility-administered medications on file prior to visit.    There are no Patient Instructions on file for this visit. No follow-ups on file.   Kris Hartmann, NP

## 2020-11-09 ENCOUNTER — Other Ambulatory Visit: Payer: Self-pay | Admitting: Internal Medicine

## 2020-11-09 NOTE — Telephone Encounter (Signed)
Refill Request.  

## 2020-11-09 NOTE — Telephone Encounter (Signed)
Pt last saw Dr Quentin Ore 10/07/20, last labs 10/07/20 Creat 1.39, age 70, weight 85.3kg, based on specified criteria pt is on appropriate dosage of Eliquis '5mg'$  BID.  Will refill rx.

## 2020-11-09 NOTE — Telephone Encounter (Signed)
Prescription refill request for Eliquis received. Last office visit:LAMBERT 10/07/20 Scr:1.39 10/07/20 Age: 38M Weight:85.3KG

## 2020-11-18 ENCOUNTER — Ambulatory Visit: Payer: PPO | Admitting: Internal Medicine

## 2020-11-18 ENCOUNTER — Other Ambulatory Visit: Payer: Self-pay

## 2020-11-18 VITALS — BP 132/82 | HR 64 | Ht 66.0 in | Wt 185.0 lb

## 2020-11-18 DIAGNOSIS — E782 Mixed hyperlipidemia: Secondary | ICD-10-CM

## 2020-11-18 DIAGNOSIS — I739 Peripheral vascular disease, unspecified: Secondary | ICD-10-CM | POA: Diagnosis not present

## 2020-11-18 DIAGNOSIS — I5022 Chronic systolic (congestive) heart failure: Secondary | ICD-10-CM

## 2020-11-18 DIAGNOSIS — I10 Essential (primary) hypertension: Secondary | ICD-10-CM

## 2020-11-18 DIAGNOSIS — N179 Acute kidney failure, unspecified: Secondary | ICD-10-CM | POA: Diagnosis not present

## 2020-11-18 DIAGNOSIS — I4891 Unspecified atrial fibrillation: Secondary | ICD-10-CM

## 2020-11-18 DIAGNOSIS — I9789 Other postprocedural complications and disorders of the circulatory system, not elsewhere classified: Secondary | ICD-10-CM | POA: Diagnosis not present

## 2020-11-18 DIAGNOSIS — I2510 Atherosclerotic heart disease of native coronary artery without angina pectoris: Secondary | ICD-10-CM | POA: Diagnosis not present

## 2020-11-18 NOTE — Patient Instructions (Signed)
Medication Instructions:   Your physician has recommended you make the following change in your medication:   AFTER you run out of current supply of Eliquis, you may STOP taking.   CONTINUE Aspirin 81 mg daily after you stop Eliquis.  *If you need a refill on your cardiac medications before your next appointment, please call your pharmacy*   Lab Work:  BMET today  If you have labs (blood work) drawn today and your tests are completely normal, you will receive your results only by: Tony (if you have MyChart) OR A paper copy in the mail If you have any lab test that is abnormal or we need to change your treatment, we will call you to review the results.   Testing/Procedures:  None ordered   Follow-Up: At [facility], you and your health needs are our priority.  As part of our continuing mission to provide you with exceptional heart care, we have created designated Provider Care Teams.  These Care Teams include your primary Cardiologist (physician) and Advanced Practice Providers (APPs -  Physician Assistants and Nurse Practitioners) who all work together to provide you with the care you need, when you need it.  We recommend signing up for the patient portal called "MyChart".  Sign up information is provided on this After Visit Summary.  MyChart is used to connect with patients for Virtual Visits (Telemedicine).  Patients are able to view lab/test results, encounter notes, upcoming appointments, etc.  Non-urgent messages can be sent to your provider as well.   To learn more about what you can do with MyChart, go to [email or web address].    Your next appointment:   6 month(s)  The format for your next appointment:   In Person  Provider:   You may see Dr. Harrell Gave End or one of the following Advanced Practice Providers on your designated Care Team:   Murray Hodgkins, NP Christell Faith, PA-C Marrianne Mood, PA-C Cadence Tahoma, [place]

## 2020-11-18 NOTE — Progress Notes (Addendum)
Follow-up Outpatient Visit Date: 11/18/2020  Primary Care Provider: Glean Hess, MD [address] [place] [address]  Chief Complaint: Follow-up coronary artery disease  HPI:  Mr. Stepanek is a 70 y.o. male with history of CAD status post PCI to LCx in 2017 in setting of NSTEMI and subsequent development of severe distal LMCA disease status post CABG in 10/2019, chronic HFrEF (mixed ischemic and nonischemic), paroxysmal atrial fibrillation, PAD, HTN, HLD, and carotid artery stenosis status post right carotid endarterectomy, who presents for follow-up of coronary artery disease and heart failure.  I last saw him in March, at which time he was feeling fairly well.  Jardiance was discontinued due to cost concerns.  He was last seen in office by Lorenso Quarry, PA, in early May, at which time he admitted complaining of increased fluid retention he was frustrated by continued weight gain despite edema.  He underwent bilateral CFA, SFA, and profunda femoris endarterectomy and patch angioplasty by vascular surgery last month.  He was seen by Dr. Quentin Ore in [facility] 2 days later and was found to be hypotensive.  He was referred to the emergency department.  Labs were reassuring.  Blood pressure normalized after a 1 L normal saline fluid bolus.  Today, Mr. Leinbach reports that he is feeling fairly well.  His right groin incision is completely healed.  His left groin has almost completely healed.  Pain in both groins has resolved.  He has just started exercising again and plans to begin playing golf again.  He notes occasional leg swelling, though this has improved with compression stocking use.  He denies chest pain, shortness of breath, palpitations, and lightheadedness.  He denies bleeding.  He inquires as to discontinuation of apixaban.   --------------------------------------------------------------------------------------------------  Past Medical History:  Diagnosis Date   Abnormal  nuclear cardiac imaging test 08/08/2015   Arthritis    fingers   Carotid artery occlusion    CHF (congestive heart failure) (HCC)    Coronary atherosclerosis of native coronary artery 01/29/2013   11/05/19 R/LHC 80% dLMCA stenosis small diffusely dz dLAD, chronically occluded OM1 50%mRCA lesion, widely patent mLCx strent, moderately elevated L heart filling pressures, mild to moderate RH filling pressures, normal to moderately reduced CO   Duodenal erosion    Encounter for screening for lung cancer 07/13/2016   Esophageal stenosis    esophageal dilation   GERD (gastroesophageal reflux disease)    H. pylori infection    Heart attack ([place]) Oct. 2009   Mild   Hiatal hernia    Hyperlipidemia    Hypertension    Old myocardial infarction 11/29/2007   Mildly elevated troponin, isolated value in October 2009. Cardiac catheterization-nonobstructive 60% RCA disease-subsequent nuclear stress test-9 minutes, low risk, mild inferior wall hypokinesis    Pain in limb 12/19/2017   Peripheral vascular disease ([place])    Unstable angina ([place]) 11/25/2017   Past Surgical History:  Procedure Laterality Date   APPENDECTOMY     BACK SURGERY     BIV ICD INSERTION CRT-D N/A 03/23/2020   Procedure: BIV ICD INSERTION CRT-D;  Surgeon: Vickie Epley, MD;  Location: [facility];  Service: Cardiovascular;  Laterality: N/A;   CARDIAC CATHETERIZATION N/A 08/07/2015   Procedure: Left Heart Cath and Coronary Angiography;  Surgeon: [name], MD;  Location: [facility];  Service: Cardiovascular;  Laterality: N/A;   CARDIAC CATHETERIZATION N/A 08/07/2015   Procedure: Coronary Stent Intervention;  Surgeon: [name],  MD;  Location: [facility];  Service: Cardiovascular;  Laterality: N/A;   CARDIAC CATHETERIZATION N/A 08/07/2015   Procedure: Coronary Stent Intervention;  Surgeon: [name], MD;  Location: [facility];  Service: Cardiovascular;  Laterality: N/A;   CAROTID ENDARTERECTOMY   01/05/2006   Right  CEA with DPA   CATARACT EXTRACTION W/ INTRAOCULAR LENS IMPLANT Left 12/04/2017   CATARACT EXTRACTION W/PHACO Left 12/04/2017   Procedure: CATARACT EXTRACTION PHACO AND INTRAOCULAR LENS PLACEMENT ([place]) LEFT;  Surgeon: Eulogio Bear, MD;  Location: [facility];  Service: Ophthalmology;  Laterality: Left;   CATARACT EXTRACTION W/PHACO Right 02/06/2018   Procedure: CATARACT EXTRACTION PHACO AND INTRAOCULAR LENS PLACEMENT ([facility])RIGHT;  Surgeon: Eulogio Bear, MD;  Location: [facility];  Service: Ophthalmology;  Laterality: Right;   COLONOSCOPY  05/20/2008   COLONOSCOPY WITH PROPOFOL N/A 09/13/2018   Procedure: COLONOSCOPY WITH BIOPSY;  Surgeon: [name], MD;  Location: [facility];  Service: Endoscopy;  Laterality: N/A;   CORONARY ARTERY BYPASS GRAFT N/A 11/11/2019   Procedure: CORONARY ARTERY BYPASS GRAFTING (CABG) USING LIMA to Diag1; ENDOSCOPICALLY HARVESTED RIGHT GREATER SAPHENOUS VEIN: SVG to OM1; SVG to OM2; SVG to PDA.;  Surgeon: Ivin Poot, MD;  Location: [facility];  Service: Open Heart Surgery;  Laterality: N/A;   CORONARY STENT PLACEMENT  08/07/2015   MID CIRCUMFLEX   ENDARTERECTOMY FEMORAL Bilateral 09/30/2020   Procedure: ENDARTERECTOMY FEMORAL;  Surgeon: Algernon Huxley, MD;  Location: [facility];  Service: Vascular;  Laterality: Bilateral;   ENDOVEIN HARVEST OF GREATER SAPHENOUS VEIN Right 11/11/2019   Procedure: ENDOVEIN HARVEST OF GREATER SAPHENOUS VEIN;  Surgeon: Ivin Poot, MD;  Location: [facility];  Service: Open Heart Surgery;  Laterality: Right;   ESOPHAGOGASTRODUODENOSCOPY (EGD) WITH PROPOFOL N/A 02/11/2019   Procedure: ESOPHAGOGASTRODUODENOSCOPY (EGD) WITH BIOPSY and  Dilation;  Surgeon: [name], MD;  Location: [facility];  Service: Endoscopy;  Laterality: N/A;   HIP SURGERY Left 10/2016   left hip tendon repair   LEFT HEART CATH AND CORONARY ANGIOGRAPHY N/A 11/27/2017   Procedure: LEFT HEART CATH AND CORONARY  ANGIOGRAPHY;  Surgeon: Wellington Hampshire, MD;  Location: [facility];  Service: Cardiovascular;  Laterality: N/A;   LOWER EXTREMITY ANGIOGRAPHY Left 02/12/2018   Procedure: LOWER EXTREMITY ANGIOGRAPHY;  Surgeon: Algernon Huxley, MD;  Location: [facility];  Service: Cardiovascular;  Laterality: Left;   LOWER EXTREMITY ANGIOGRAPHY Left 03/07/2018   Procedure: LOWER EXTREMITY ANGIOGRAPHY;  Surgeon: Algernon Huxley, MD;  Location: [facility];  Service: Cardiovascular;  Laterality: Left;   LOWER EXTREMITY ANGIOGRAPHY Left 06/04/2018   Procedure: LOWER EXTREMITY ANGIOGRAPHY;  Surgeon: Algernon Huxley, MD;  Location: [facility];  Service: Cardiovascular;  Laterality: Left;   LOWER EXTREMITY ANGIOGRAPHY Left 09/17/2020   Procedure: LOWER EXTREMITY ANGIOGRAPHY;  Surgeon: Algernon Huxley, MD;  Location: [facility];  Service: Cardiovascular;  Laterality: Left;   LOWER EXTREMITY ANGIOGRAPHY Left 09/28/2020   Procedure: LOWER EXTREMITY ANGIOGRAPHY;  Surgeon: Algernon Huxley, MD;  Location: [facility];  Service: Cardiovascular;  Laterality: Left;   PLACEMENT OF IMPELLA LEFT VENTRICULAR ASSIST DEVICE N/A 11/11/2019   Procedure: PLACEMENT OF IMPELLA LEFT VENTRICULAR ASSIST DEVICE 5.5;  Surgeon: Ivin Poot, MD;  Location: [facility];  Service: Open Heart Surgery;  Laterality: N/A;  Midline Sternotomy   POLYPECTOMY N/A 09/13/2018   Procedure: POLYPECTOMY;  Surgeon: [name], MD;  Location: [facility];  Service: Endoscopy;  Laterality: N/A;   POLYPECTOMY N/A 02/11/2019   Procedure: POLYPECTOMY;  Surgeon: [name], MD;  Location: [facility];  Service: Endoscopy;  Laterality: N/A;   REMOVAL OF IMPELLA LEFT VENTRICULAR ASSIST DEVICE N/A 11/15/2019   Procedure: REMOVAL OF IMPELLA 5.5 LEFT VENTRICULAR ASSIST DEVICE;  Surgeon: Ivin Poot, MD;  Location: [facility];  Service: Open Heart Surgery;  Laterality: N/A;   RIGHT/LEFT HEART CATH AND CORONARY ANGIOGRAPHY  N/A 11/05/2019   Procedure: RIGHT/LEFT HEART CATH AND CORONARY ANGIOGRAPHY;  Surgeon: Nelva Bush, MD;  Location: [facility];  Service: Cardiovascular;  Laterality: N/A;   SPINE SURGERY     TEE WITHOUT CARDIOVERSION N/A 11/11/2019   Procedure: TRANSESOPHAGEAL ECHOCARDIOGRAM (TEE);  Surgeon: Prescott Gum, Collier Salina, MD;  Location: [facility];  Service: Open Heart Surgery;  Laterality: N/A;   TEE WITHOUT CARDIOVERSION N/A 11/15/2019   Procedure: TRANSESOPHAGEAL ECHOCARDIOGRAM (TEE);  Surgeon: Prescott Gum, Collier Salina, MD;  Location: [facility];  Service: Open Heart Surgery;  Laterality: N/A;   TONSILLECTOMY      Current Meds  Medication Sig   Alirocumab (PRALUENT) 150 MG/ML SOAJ Inject 1 pen into the skin every 14 (fourteen) days.   apixaban (ELIQUIS) 5 MG TABS tablet TAKE 1 TABLET BY MOUTH TWICE A DAY   aspirin EC 81 MG tablet Take 1 tablet (81 mg total) by mouth daily.   carvedilol (COREG) 12.5 MG tablet Take 1 tablet (12.5 mg total) by mouth 2 (two) times daily.   colchicine 0.6 MG tablet Take 1 tablet (0.6 mg total) by mouth 2 (two) times daily.   gabapentin (NEURONTIN) 300 MG capsule Take 1 capsule (300 mg total) by mouth at bedtime.   nitroGLYCERIN (NITROSTAT) 0.4 MG SL tablet Place 1 tablet (0.4 mg total) under the tongue every 5 (five) minutes as needed for chest pain.   oxyCODONE (OXY IR/ROXICODONE) 5 MG immediate release tablet Take 1-2 tablets (5-10 mg total) by mouth every 6 (six) hours as needed for severe pain or moderate pain.   sacubitril-valsartan (ENTRESTO) 49-51 MG Take 1 tablet by mouth 2 (two) times daily. Need to make an appointment for further refills   silver sulfADIAZINE (SILVADENE) 1 % cream Apply 1 application topically daily.   traZODone (DESYREL) 50 MG tablet TAKE 1 TABLET BY MOUTH AT BEDTIME AS NEEDED FOR SLEEP.    Allergies: Brilinta [ticagrelor], Chlorhexidine gluconate, Contrast media [iodinated diagnostic agents], Statins, and Zetia [ezetimibe]  Social History   Tobacco  Use   Smoking status: Former    Packs/day: 1.25    Years: 35.00    Pack years: 43.75    Types: Cigarettes    Quit date: 02/28/2005    Years since quitting: 15.7   Smokeless tobacco: Current    Types: Snuff   Tobacco comments:    occaisionally  Vaping Use   Vaping Use: Never used  Substance Use Topics   Alcohol use: Yes    Alcohol/week: 8.0 - 10.0 standard drinks    Types: 8 - 10 Glasses of wine per week    Comment: weekly   Drug use: No    Family History  Problem Relation Age of Onset   Heart attack Mother    Coronary artery disease Mother    Heart disease Mother        Carotid Stenosis and BPG and Heart Disease before age 54   Diabetes Mother    Hypertension Mother    Heart attack Father    Heart disease  Father        BPG and Heart Disease before age 23   Hypertension Father    Cancer Father 83       throat   Stroke Father    Colon cancer Neg Hx    Colon polyps Neg Hx    Esophageal cancer Neg Hx    Rectal cancer Neg Hx    Stomach cancer Neg Hx     Review of Systems: A 12-system review of systems was performed and was negative except as noted in the HPI.  --------------------------------------------------------------------------------------------------  Physical Exam: BP 132/82 (BP Location: Left Arm, Patient Position: Sitting, Cuff Size: Normal)   Pulse 64   Ht 5\' 6"  (1.676 m)   Wt 185 lb (83.9 kg)   SpO2 97%   BMI 29.86 kg/m   General:  NAD. Neck: No JVD or HJR. Lungs: Clear to auscultation bilaterally without wheezes or crackles. Heart: Regular rate and rhythm without murmurs, rubs, or gallops. Abdomen: Soft, nontender, nondistended. Extremities: No lower extremity edema with compression stockings in place.   Lab Results  Component Value Date   WBC 5.1 10/07/2020   HGB 11.5 (L) 10/07/2020   HCT 34.4 (L) 10/07/2020   MCV 98.0 10/07/2020   PLT 218 10/07/2020    Lab Results  Component Value Date   NA 137 10/07/2020   K 4.6 10/07/2020   CL  98 10/07/2020   CO2 27 10/07/2020   BUN 30 (H) 10/07/2020   CREATININE 1.39 (H) 10/07/2020   GLUCOSE 109 (H) 10/07/2020   ALT 21 10/07/2020    Lab Results  Component Value Date   CHOL 144 01/07/2020   HDL 62 01/07/2020   LDLCALC 50 01/07/2020   TRIG 158 (H) 01/07/2020   CHOLHDL 2.3 01/07/2020    --------------------------------------------------------------------------------------------------  ASSESSMENT AND PLAN: Coronary artery disease: Mr. Jares is doing well now 1 year out from his CABG complicated by HFrEF.  He has CCS class I symptoms.  I have encouraged him to begin exercising again now that he is recovered from his bilateral femoral endarterectomies.  We will continue secondary prevention with aspirin, carvedilol, and Delarosa Mab.  Chronic HFrEF with recovered ejection fraction: Mr. Heyde reports mild intermittent leg edema well controlled with compression stockings.  This may have been exacerbated by his femoral endarterectomies in August.  He reports NYHA class II symptoms.  We will continue with his current regimen of Entresto and carvedilol, given normalization of LVEF by most recent echo in June.  PAD: Mr. Costlow is recovering from bilateral femoral endarterectomies by vascular surgery in August.  He also has mild-moderate bilateral internal carotid artery disease, which will be managed medically.  Continue secondary prevention with aspirin as well as aggressive lipid management.  Postoperative atrial fibrillation: No further atrial fibrillation noted on device interrogations.  I have spoken with Mr. Laforest and Dr. Quentin Ore; we have agreed to discontinue apixaban when he has exhausted his current supply.  Continue carvedilol.  Hypertension: Blood pressure upper normal today.  We discussed escalation his heart failure regimen to help with blood pressure control as well but have agreed to defer this given lower readings at home at times.  Hyperlipidemia: LDL well controlled  with alirocumab.  Continue lifestyle modifications to help improve triglycerides.  Acute kidney injury: AKI noted at the time of ED visit in August, likely from dehydration in the setting of recent vascular surgery.  We will repeat a BMP today to ensure that renal function has returned back to  baseline.  Follow-up: Return to clinic in 6 months.  Nelva Bush, MD 11/18/2020 9:52 AM

## 2020-11-19 LAB — BASIC METABOLIC PANEL
BUN/Creatinine Ratio: 22 (ref 10–24)
BUN: 19 mg/dL (ref 8–27)
CO2: 19 mmol/L — ABNORMAL LOW (ref 20–29)
Calcium: 9.3 mg/dL (ref 8.6–10.2)
Chloride: 106 mmol/L (ref 96–106)
Creatinine, Ser: 0.88 mg/dL (ref 0.76–1.27)
Glucose: 100 mg/dL — ABNORMAL HIGH (ref 65–99)
Potassium: 4.7 mmol/L (ref 3.5–5.2)
Sodium: 144 mmol/L (ref 134–144)
eGFR: 93 mL/min/{1.73_m2} (ref >59)

## 2020-11-20 ENCOUNTER — Encounter: Payer: Self-pay | Admitting: Internal Medicine

## 2020-11-20 DIAGNOSIS — N179 Acute kidney failure, unspecified: Secondary | ICD-10-CM | POA: Insufficient documentation

## 2020-11-24 ENCOUNTER — Other Ambulatory Visit: Payer: Self-pay

## 2020-11-24 ENCOUNTER — Ambulatory Visit (INDEPENDENT_AMBULATORY_CARE_PROVIDER_SITE_OTHER): Payer: PPO | Admitting: Vascular Surgery

## 2020-11-24 VITALS — BP 129/76 | HR 65 | Ht 66.0 in | Wt 189.0 lb

## 2020-11-24 DIAGNOSIS — M9903 Segmental and somatic dysfunction of lumbar region: Secondary | ICD-10-CM | POA: Diagnosis not present

## 2020-11-24 DIAGNOSIS — I10 Essential (primary) hypertension: Secondary | ICD-10-CM

## 2020-11-24 DIAGNOSIS — M545 Low back pain, unspecified: Secondary | ICD-10-CM | POA: Diagnosis not present

## 2020-11-24 DIAGNOSIS — I70229 Atherosclerosis of native arteries of extremities with rest pain, unspecified extremity: Secondary | ICD-10-CM

## 2020-11-24 DIAGNOSIS — I6523 Occlusion and stenosis of bilateral carotid arteries: Secondary | ICD-10-CM

## 2020-11-24 DIAGNOSIS — E782 Mixed hyperlipidemia: Secondary | ICD-10-CM

## 2020-11-24 NOTE — Assessment & Plan Note (Signed)
No new symptoms.  Scheduled to have this checked and we will combine that with his lower extremity follow-up in the next 2 to 3 months.

## 2020-11-24 NOTE — Assessment & Plan Note (Signed)
Marked improvement after revascularization of both lower extremities.  Overall doing well.  Return in 2 to 3 months with ABIs.  Can resume all normal activities.

## 2020-11-24 NOTE — Assessment & Plan Note (Signed)
blood pressure control important in reducing the progression of atherosclerotic disease. On appropriate oral medications.  

## 2020-11-24 NOTE — Assessment & Plan Note (Signed)
lipid control important in reducing the progression of atherosclerotic disease. Continue statin therapy  

## 2020-11-24 NOTE — Progress Notes (Signed)
Patient ID: [name], male   DOB: 03-23-1950, 70 y.o.   MRN: [id number]  Chief Complaint  Patient presents with   Follow-up    4 wk wound check    HPI [name] is a 70 y.o. male.  Patient returns in follow-up for recheck of his wounds.  At his initial postop visit, he had some seroma of the left groin and mild wound separation.  This has essentially resolved.  The pain on the inner legs is much better although not completely resolved.  He overall is doing well.  Has resumed most of his normal activities.   Past Medical History:  Diagnosis Date   Abnormal nuclear cardiac imaging test 08/08/2015   Arthritis    fingers   Carotid artery occlusion    CHF (congestive heart failure) (HCC)    Coronary atherosclerosis of native coronary artery 01/29/2013   11/05/19 R/LHC 80% dLMCA stenosis small diffusely dz dLAD, chronically occluded OM1 50%mRCA lesion, widely patent mLCx strent, moderately elevated L heart filling pressures, mild to moderate RH filling pressures, normal to moderately reduced CO   Duodenal erosion    Encounter for screening for lung cancer 07/13/2016   Esophageal stenosis    esophageal dilation   GERD (gastroesophageal reflux disease)    H. pylori infection    Heart attack ([facility]) Oct. 2009   Mild   Hiatal hernia    Hyperlipidemia    Hypertension    Old myocardial infarction 11/29/2007   Mildly elevated troponin, isolated value in October 2009. Cardiac catheterization-nonobstructive 60% RCA disease-subsequent nuclear stress test-9 minutes, low risk, mild inferior wall hypokinesis    Pain in limb 12/19/2017   Peripheral vascular disease ([place])    Unstable angina ([place]) 11/25/2017    Past Surgical History:  Procedure Laterality Date   APPENDECTOMY     BACK SURGERY     BIV ICD INSERTION CRT-D N/A 03/23/2020   Procedure: BIV ICD INSERTION CRT-D;  Surgeon: Vickie Epley, MD;  Location: [facility];  Service: Cardiovascular;  Laterality: N/A;   CARDIAC  CATHETERIZATION N/A 08/07/2015   Procedure: Left Heart Cath and Coronary Angiography;  Surgeon: [name], MD;  Location: [facility];  Service: Cardiovascular;  Laterality: N/A;   CARDIAC CATHETERIZATION N/A 08/07/2015   Procedure: Coronary Stent Intervention;  Surgeon: [name], MD;  Location: [facility];  Service: Cardiovascular;  Laterality: N/A;   CARDIAC CATHETERIZATION N/A 08/07/2015   Procedure: Coronary Stent Intervention;  Surgeon: [name], MD;  Location: [facility];  Service: Cardiovascular;  Laterality: N/A;   CAROTID ENDARTERECTOMY  01/05/2006   Right  CEA with DPA   CATARACT EXTRACTION W/ INTRAOCULAR LENS IMPLANT Left 12/04/2017   CATARACT EXTRACTION W/PHACO Left 12/04/2017   Procedure: CATARACT EXTRACTION PHACO AND INTRAOCULAR LENS PLACEMENT ([place]) LEFT;  Surgeon: Eulogio Bear, MD;  Location: [facility];  Service: Ophthalmology;  Laterality: Left;   CATARACT EXTRACTION W/PHACO Right 02/06/2018   Procedure: CATARACT EXTRACTION PHACO AND INTRAOCULAR LENS PLACEMENT ([facility])RIGHT;  Surgeon: Eulogio Bear, MD;  Location: [facility];  Service: Ophthalmology;  Laterality: Right;   COLONOSCOPY  05/20/2008   COLONOSCOPY WITH PROPOFOL N/A 09/13/2018   Procedure: COLONOSCOPY WITH BIOPSY;  Surgeon: [name], MD;  Location: [facility];  Service: Endoscopy;  Laterality: N/A;   CORONARY ARTERY BYPASS GRAFT N/A 11/11/2019   Procedure: CORONARY ARTERY BYPASS GRAFTING (CABG) USING LIMA to Diag1; ENDOSCOPICALLY HARVESTED RIGHT  GREATER SAPHENOUS VEIN: SVG to OM1; SVG to OM2; SVG to PDA.;  Surgeon: Ivin Poot, MD;  Location: [facility];  Service: Open Heart Surgery;  Laterality: N/A;   CORONARY STENT PLACEMENT  08/07/2015   MID CIRCUMFLEX   ENDARTERECTOMY FEMORAL Bilateral 09/30/2020   Procedure: ENDARTERECTOMY FEMORAL;  Surgeon: Algernon Huxley, MD;  Location: [facility];  Service: Vascular;  Laterality: Bilateral;   ENDOVEIN HARVEST OF  GREATER SAPHENOUS VEIN Right 11/11/2019   Procedure: ENDOVEIN HARVEST OF GREATER SAPHENOUS VEIN;  Surgeon: Ivin Poot, MD;  Location: [facility];  Service: Open Heart Surgery;  Laterality: Right;   ESOPHAGOGASTRODUODENOSCOPY (EGD) WITH PROPOFOL N/A 02/11/2019   Procedure: ESOPHAGOGASTRODUODENOSCOPY (EGD) WITH BIOPSY and  Dilation;  Surgeon: [name], MD;  Location: [facility];  Service: Endoscopy;  Laterality: N/A;   HIP SURGERY Left 10/2016   left hip tendon repair   LEFT HEART CATH AND CORONARY ANGIOGRAPHY N/A 11/27/2017   Procedure: LEFT HEART CATH AND CORONARY ANGIOGRAPHY;  Surgeon: Wellington Hampshire, MD;  Location: [facility];  Service: Cardiovascular;  Laterality: N/A;   LOWER EXTREMITY ANGIOGRAPHY Left 02/12/2018   Procedure: LOWER EXTREMITY ANGIOGRAPHY;  Surgeon: Algernon Huxley, MD;  Location: [facility];  Service: Cardiovascular;  Laterality: Left;   LOWER EXTREMITY ANGIOGRAPHY Left 03/07/2018   Procedure: LOWER EXTREMITY ANGIOGRAPHY;  Surgeon: Algernon Huxley, MD;  Location: [facility];  Service: Cardiovascular;  Laterality: Left;   LOWER EXTREMITY ANGIOGRAPHY Left 06/04/2018   Procedure: LOWER EXTREMITY ANGIOGRAPHY;  Surgeon: Algernon Huxley, MD;  Location: [facility];  Service: Cardiovascular;  Laterality: Left;   LOWER EXTREMITY ANGIOGRAPHY Left 09/17/2020   Procedure: LOWER EXTREMITY ANGIOGRAPHY;  Surgeon: Algernon Huxley, MD;  Location: [facility];  Service: Cardiovascular;  Laterality: Left;   LOWER EXTREMITY ANGIOGRAPHY Left 09/28/2020   Procedure: LOWER EXTREMITY ANGIOGRAPHY;  Surgeon: Algernon Huxley, MD;  Location: [facility];  Service: Cardiovascular;  Laterality: Left;   PLACEMENT OF IMPELLA LEFT VENTRICULAR ASSIST DEVICE N/A 11/11/2019   Procedure: PLACEMENT OF IMPELLA LEFT VENTRICULAR ASSIST DEVICE 5.5;  Surgeon: Ivin Poot, MD;  Location: [facility];  Service: Open Heart Surgery;  Laterality: N/A;  Midline Sternotomy    POLYPECTOMY N/A 09/13/2018   Procedure: POLYPECTOMY;  Surgeon: [name], MD;  Location: [facility];  Service: Endoscopy;  Laterality: N/A;   POLYPECTOMY N/A 02/11/2019   Procedure: POLYPECTOMY;  Surgeon: [name], MD;  Location: [facility];  Service: Endoscopy;  Laterality: N/A;   REMOVAL OF IMPELLA LEFT VENTRICULAR ASSIST DEVICE N/A 11/15/2019   Procedure: REMOVAL OF IMPELLA 5.5 LEFT VENTRICULAR ASSIST DEVICE;  Surgeon: Ivin Poot, MD;  Location: [facility];  Service: Open Heart Surgery;  Laterality: N/A;   RIGHT/LEFT HEART CATH AND CORONARY ANGIOGRAPHY N/A 11/05/2019   Procedure: RIGHT/LEFT HEART CATH AND CORONARY ANGIOGRAPHY;  Surgeon: Nelva Bush, MD;  Location: [facility];  Service: Cardiovascular;  Laterality: N/A;   SPINE SURGERY     TEE WITHOUT CARDIOVERSION N/A 11/11/2019   Procedure: TRANSESOPHAGEAL ECHOCARDIOGRAM (TEE);  Surgeon: Prescott Gum, Collier Salina, MD;  Location: [facility];  Service: Open Heart Surgery;  Laterality: N/A;   TEE WITHOUT CARDIOVERSION N/A 11/15/2019   Procedure: TRANSESOPHAGEAL ECHOCARDIOGRAM (TEE);  Surgeon: Prescott Gum, Collier Salina, MD;  Location: [facility];  Service: Open Heart Surgery;  Laterality: N/A;   TONSILLECTOMY        Allergies  Allergen Reactions   Brilinta [Ticagrelor]  Shortness Of Breath   Chlorhexidine Gluconate Other (See Comments)    Skin burning for hours afterward   Contrast Media [Iodinated Diagnostic Agents] Itching    Face and head flushing, nose itching after contrast administation for angiogram   Statins Other (See Comments)    Failed Crestor 5 mg twice weekly, Crestor 20 mg daily, Pravastatin 40 mg qd, Lipitor, Zocor - muscle aches   Zetia [Ezetimibe] Other (See Comments)    Muscle aches    Current Outpatient Medications  Medication Sig Dispense Refill   Alirocumab (PRALUENT) 150 MG/ML SOAJ Inject 1 pen into the skin every 14 (fourteen) days. 2 mL 2   aspirin EC 81 MG tablet Take 1 tablet (81 mg total) by mouth  daily. 150 tablet 2   carvedilol (COREG) 12.5 MG tablet Take 1 tablet (12.5 mg total) by mouth 2 (two) times daily. 180 tablet 3   colchicine 0.6 MG tablet Take 1 tablet (0.6 mg total) by mouth 2 (two) times daily. 30 tablet 0   gabapentin (NEURONTIN) 300 MG capsule Take 1 capsule (300 mg total) by mouth at bedtime. 30 capsule 0   nitroGLYCERIN (NITROSTAT) 0.4 MG SL tablet Place 1 tablet (0.4 mg total) under the tongue every 5 (five) minutes as needed for chest pain. 25 tablet 2   oxyCODONE (OXY IR/ROXICODONE) 5 MG immediate release tablet Take 1-2 tablets (5-10 mg total) by mouth every 6 (six) hours as needed for severe pain or moderate pain. 50 tablet 0   sacubitril-valsartan (ENTRESTO) 49-51 MG Take 1 tablet by mouth 2 (two) times daily. Need to make an appointment for further refills 60 tablet 0   silver sulfADIAZINE (SILVADENE) 1 % cream Apply 1 application topically daily. 50 g 0   traZODone (DESYREL) 50 MG tablet TAKE 1 TABLET BY MOUTH AT BEDTIME AS NEEDED FOR SLEEP. 90 tablet 1   No current facility-administered medications for this visit.        Physical Exam BP 129/76   Pulse 65   Ht 5\' 6"  (1.676 m)   Wt 189 lb (85.7 kg)   BMI 30.51 kg/m  Gen:  WD/WN, NAD Skin: incision C/D/I.  Previous seroma from the left groin has essentially resolved.  No erythema or drainage.     Assessment/Plan:  No problem-specific Assessment & Plan notes found for this encounter.      [name] 11/24/2020, 8:48 AM   This note was created with Dragon medical transcription system.  Any errors from dictation are unintentional.

## 2020-12-08 ENCOUNTER — Telehealth: Payer: Self-pay | Admitting: Internal Medicine

## 2020-12-08 NOTE — Telephone Encounter (Signed)
Patient came by office Dropped off Assistance forms to be completed Placed in nurse box

## 2020-12-09 NOTE — Telephone Encounter (Signed)
Spoke to pt.  Notified that I have received both pt assistance forms.  Dr. Saunders Revel has signed and I faxed both to:  [employer] PAF for [facility] @ [phone]  [employer] @ [phone]  Pt appreciative and has no further questions.  Forms placed in designated file cabinet.

## 2020-12-16 ENCOUNTER — Other Ambulatory Visit (INDEPENDENT_AMBULATORY_CARE_PROVIDER_SITE_OTHER): Payer: Self-pay | Admitting: Vascular Surgery

## 2020-12-16 NOTE — Telephone Encounter (Signed)
Received fax from [employer] re [name] PAF: Pt is eligible to receive Entresto until 02/27/21.  Pt will be notified by [employer] 45 days prior to expiration to re-enroll.

## 2020-12-22 ENCOUNTER — Ambulatory Visit (INDEPENDENT_AMBULATORY_CARE_PROVIDER_SITE_OTHER): Payer: PPO

## 2020-12-22 DIAGNOSIS — I428 Other cardiomyopathies: Secondary | ICD-10-CM | POA: Diagnosis not present

## 2020-12-22 LAB — CUP PACEART REMOTE DEVICE CHECK
Battery Remaining Longevity: 65 mo
Battery Remaining Percentage: 86 %
Battery Voltage: 2.98 V
Brady Statistic AP VP Percent: 51 %
Brady Statistic AP VS Percent: 1 %
Brady Statistic AS VP Percent: 28 %
Brady Statistic AS VS Percent: 3.1 %
Brady Statistic RA Percent Paced: 32 %
Date Time Interrogation Session: 20221025021535
HighPow Impedance: 54 Ohm
Implantable Lead Implant Date: 20220124
Implantable Lead Implant Date: 20220124
Implantable Lead Implant Date: 20220124
Implantable Lead Location: 753858
Implantable Lead Location: 753859
Implantable Lead Location: 753860
Implantable Pulse Generator Implant Date: 20220124
Lead Channel Impedance Value: 430 Ohm
Lead Channel Impedance Value: 460 Ohm
Lead Channel Impedance Value: 510 Ohm
Lead Channel Pacing Threshold Amplitude: 0.625 V
Lead Channel Pacing Threshold Amplitude: 0.75 V
Lead Channel Pacing Threshold Amplitude: 1.625 V
Lead Channel Pacing Threshold Pulse Width: 0.5 ms
Lead Channel Pacing Threshold Pulse Width: 0.5 ms
Lead Channel Pacing Threshold Pulse Width: 0.5 ms
Lead Channel Sensing Intrinsic Amplitude: 12 mV
Lead Channel Sensing Intrinsic Amplitude: 2.8 mV
Lead Channel Setting Pacing Amplitude: 1.625
Lead Channel Setting Pacing Amplitude: 2 V
Lead Channel Setting Pacing Amplitude: 2.125
Lead Channel Setting Pacing Pulse Width: 0.5 ms
Lead Channel Setting Pacing Pulse Width: 0.5 ms
Lead Channel Setting Sensing Sensitivity: 0.5 mV
Pulse Gen Serial Number: 810017138

## 2020-12-31 NOTE — Progress Notes (Signed)
Remote ICD transmission.   

## 2021-01-04 ENCOUNTER — Telehealth: Payer: Self-pay

## 2021-01-04 NOTE — Telephone Encounter (Signed)
Lmom pt that the [employer]well approval

## 2021-01-04 NOTE — Telephone Encounter (Signed)
Patient called back. Advised patient that his [employer] grant was renewed. Gave him the new ID # to give his pharmacy.

## 2021-01-05 DIAGNOSIS — M9903 Segmental and somatic dysfunction of lumbar region: Secondary | ICD-10-CM | POA: Diagnosis not present

## 2021-01-05 DIAGNOSIS — M545 Low back pain, unspecified: Secondary | ICD-10-CM | POA: Diagnosis not present

## 2021-01-11 ENCOUNTER — Other Ambulatory Visit (INDEPENDENT_AMBULATORY_CARE_PROVIDER_SITE_OTHER): Payer: Self-pay | Admitting: Nurse Practitioner

## 2021-01-11 MED ORDER — SILVER SULFADIAZINE 1 % EX CREA: 1.0000 "application " | TOPICAL_CREAM | Freq: Every day | CUTANEOUS | 0 refills | Status: DC

## 2021-01-11 NOTE — Telephone Encounter (Signed)
Patient dropped off PAF placed in box 

## 2021-01-13 MED ORDER — SACUBITRIL-VALSARTAN 49-51 MG PO TABS: 1.0000 | ORAL_TABLET | Freq: Two times a day (BID) | ORAL | 3 refills | Status: DC

## 2021-01-13 NOTE — Addendum Note (Signed)
Addended by: Darlyne Russian on: 01/13/2021 08:40 AM   Modules accepted: Orders

## 2021-01-13 NOTE — Telephone Encounter (Signed)
Received PAF forms. Completing provider portion, will have Dr. Saunders Revel sign.  Rx printed along with required supporting documentation.

## 2021-01-14 ENCOUNTER — Telehealth: Payer: Self-pay

## 2021-01-14 NOTE — Telephone Encounter (Signed)
Completed PAF application has been faxed to [employer] @ [phone] with confirmation received via fax.  Forms placed in designated file cabinet.

## 2021-01-14 NOTE — Telephone Encounter (Signed)
The patient called stating his insurance is no longer covering remote monitoring. He would like to know what he should do. I told him if he can not afford to pay out of pocket maybe he can come in office every 3 months to be checked since he has a defibrillator. I told him I will asked Dr. Quentin Ore what his recommendations would be. I told him once I hear from Dr. Quentin Ore I will call him back.

## 2021-01-15 IMAGING — DX DG CHEST 1V PORT
1 series · 1 of 1 positions shown · non-contrast
Comparison: 11/17/2019.

CLINICAL DATA: Soreness. Recent removal of Impella device. History
of coronary artery disease.

EXAM:
PORTABLE CHEST 1 VIEW

[chest ap]
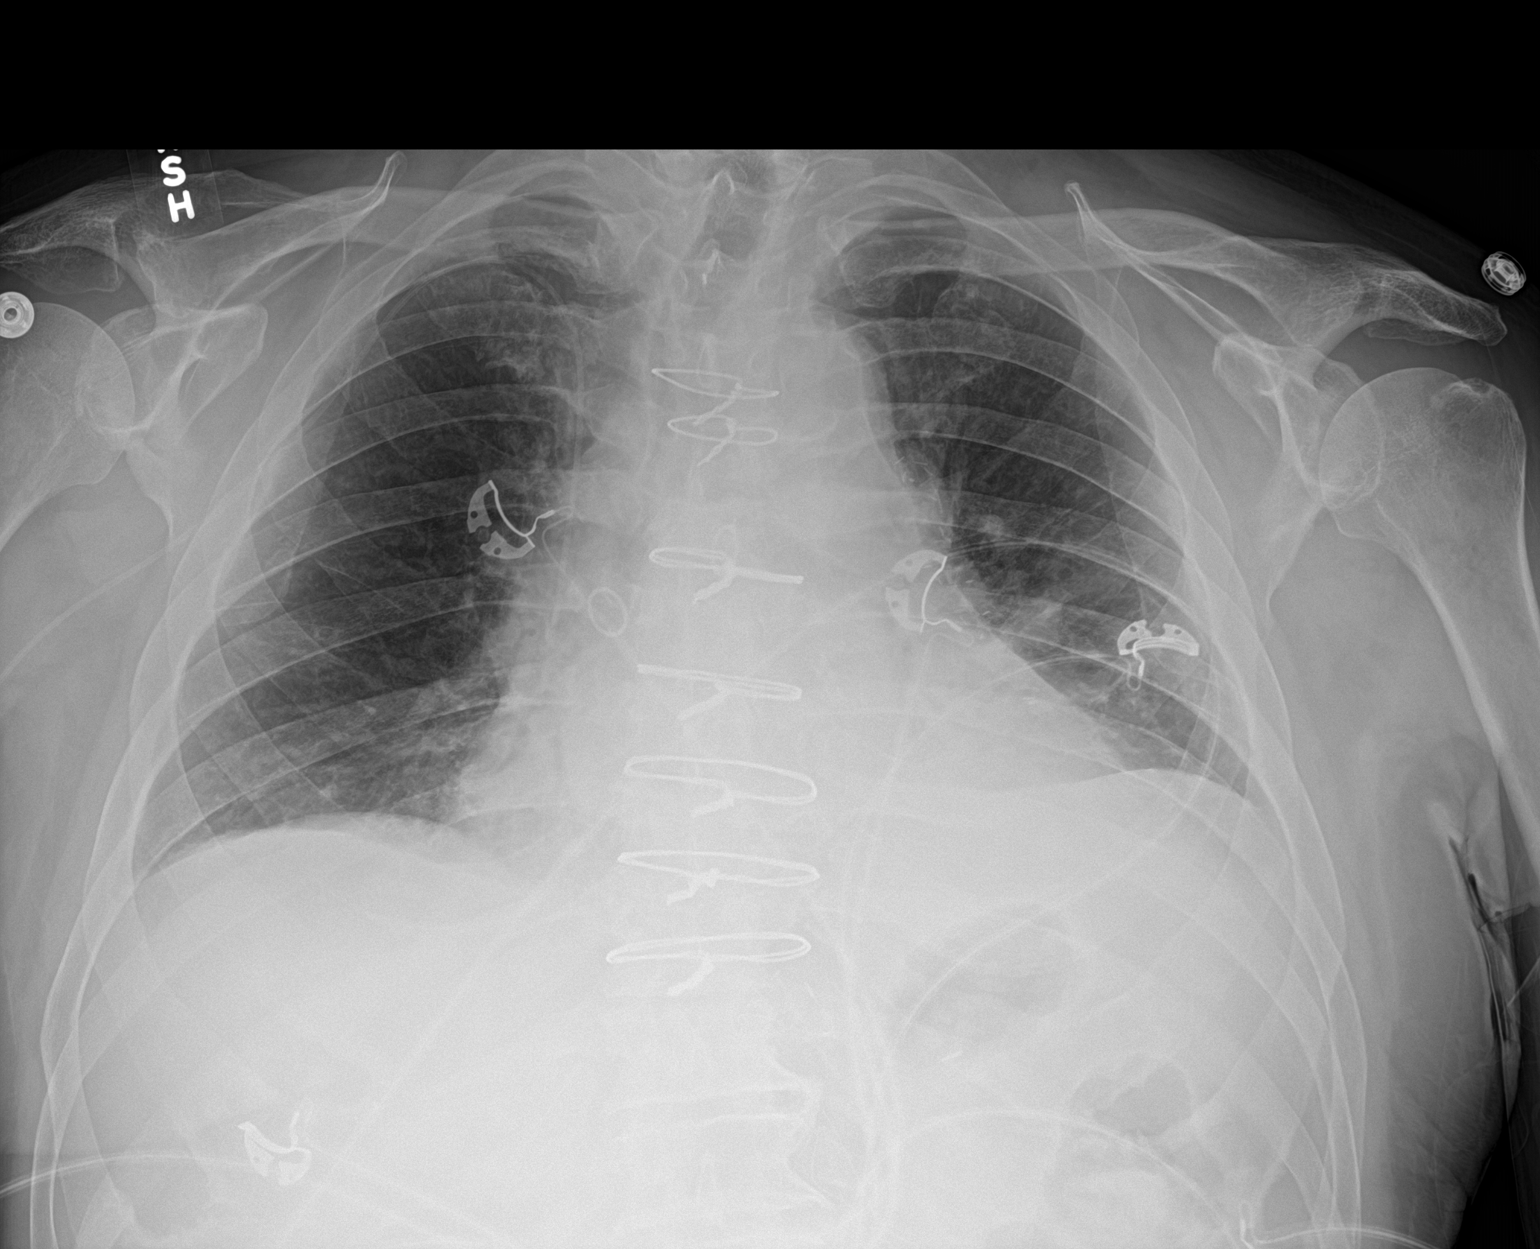

[1 of 1 positions shown; findings below may reference images not displayed]

FINDINGS: Right PICC line in stable position. Prior CABG. Stable cardiomegaly.
Low lung volumes with bibasilar atelectasis. No pleural effusion
noted on today's exam. No pneumothorax.
IMPRESSION: 1.  Right PICC line stable position.

2.  Prior CABG.  Stable cardiomegaly.

3. Low lung volumes with bibasilar atelectasis again noted. No
pleural effusion noted on today's exam. No pneumothorax.

## 2021-01-20 NOTE — Telephone Encounter (Signed)
I let the patient know that his remotes are covered and his insurance company should be giving him a call to explain things to him.

## 2021-02-03 NOTE — Telephone Encounter (Signed)
Patient calling  Wants to clarify status of patient assistance  Please call

## 2021-02-04 NOTE — Telephone Encounter (Signed)
Spoke with pt. States that he talked to [employer] and was told that they do have all documentation and will send response.  Notified pt that I will call once I receive response via fax.  Asked pt to let me know if he has not heard back in ~ 1 week and I can follow up with [employer].  Pt appreciative and voiced understanding. No further needs at this time.

## 2021-02-13 ENCOUNTER — Encounter (INDEPENDENT_AMBULATORY_CARE_PROVIDER_SITE_OTHER): Payer: Self-pay | Admitting: Vascular Surgery

## 2021-02-13 IMAGING — DX DG CHEST 2V
2 series · 2 of 2 positions shown · non-contrast
Comparison: 11/19/2019

CLINICAL DATA: Status post CABG

EXAM:
CHEST - 2 VIEW

[dg chest 2 view (1 of 2)]
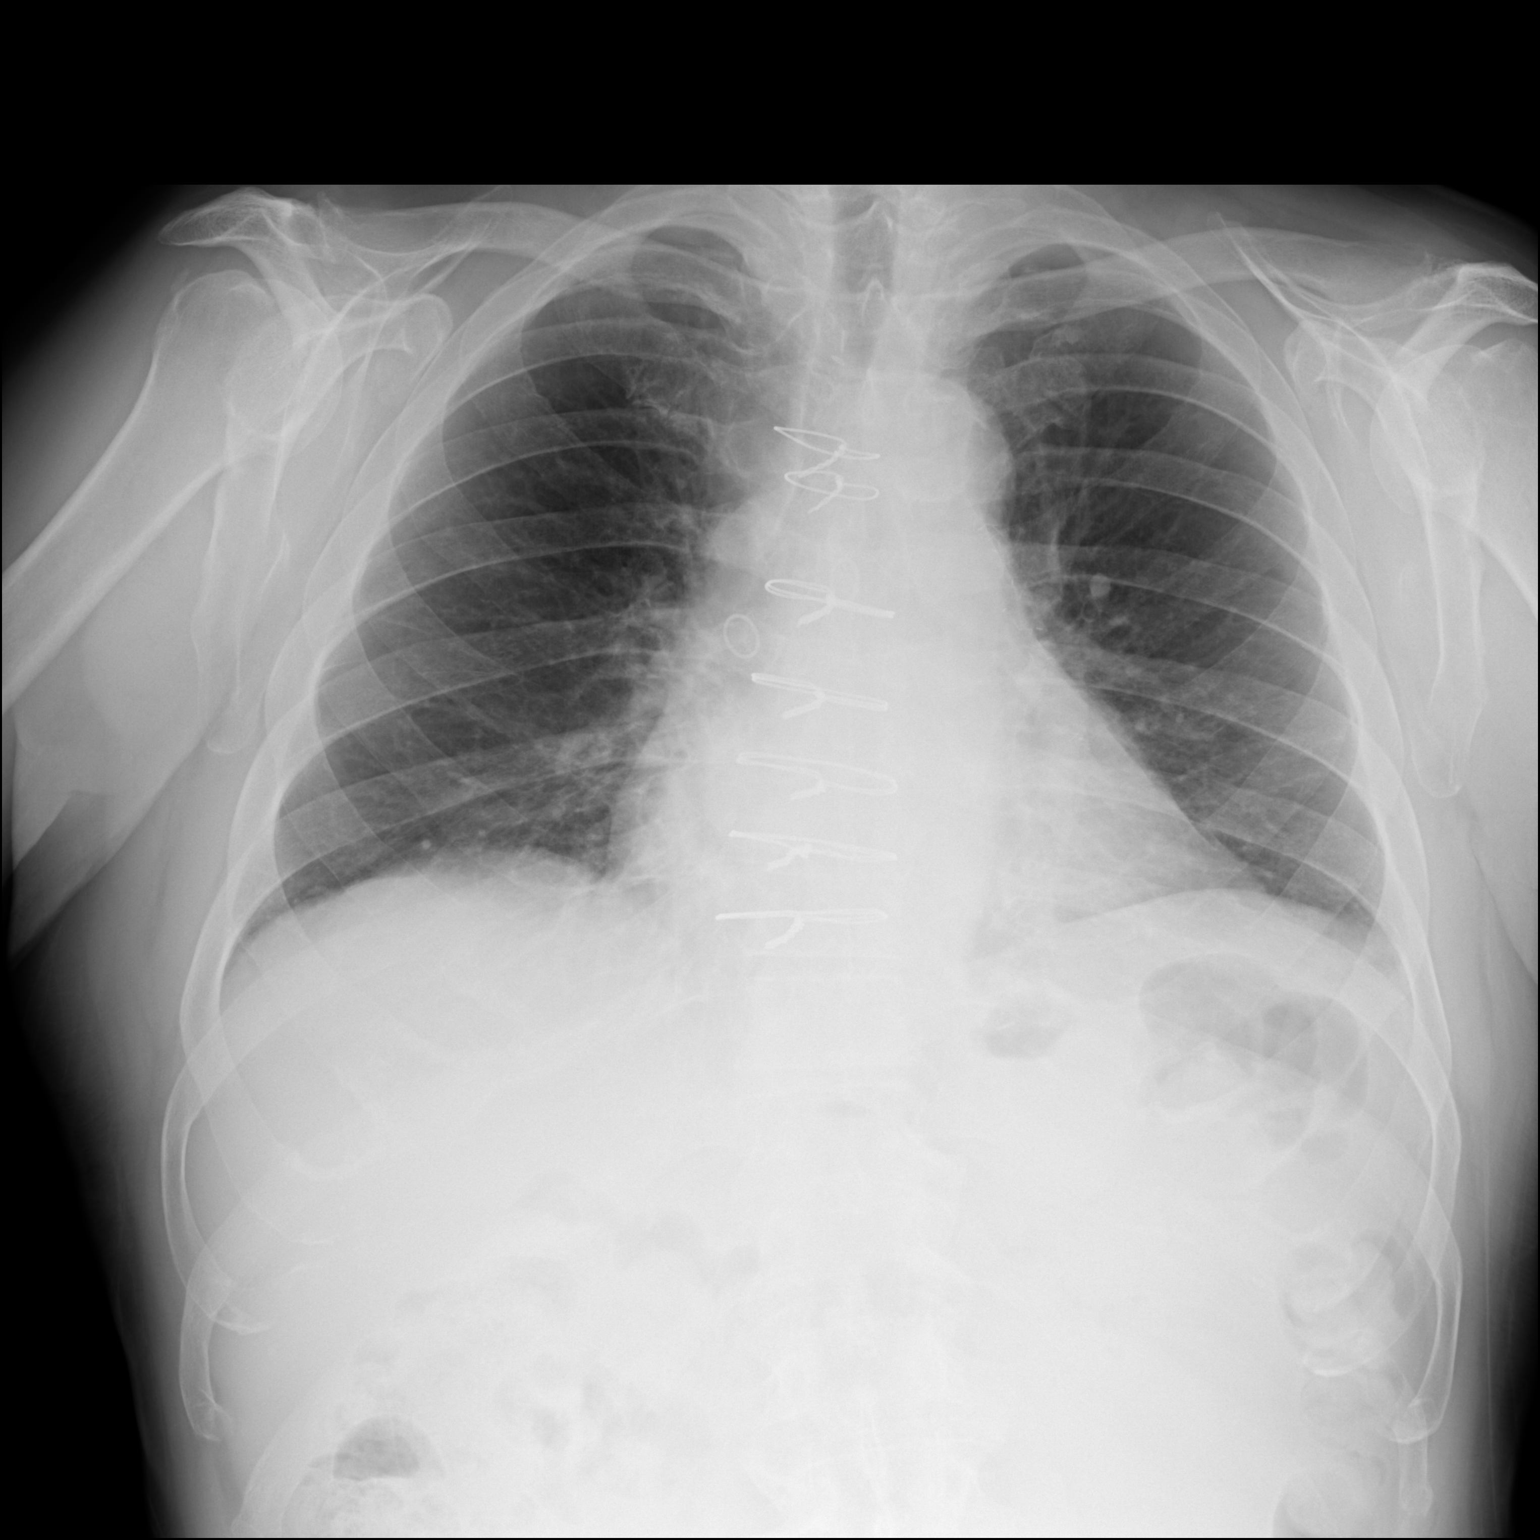

[dg chest 2 view (2 of 2)]
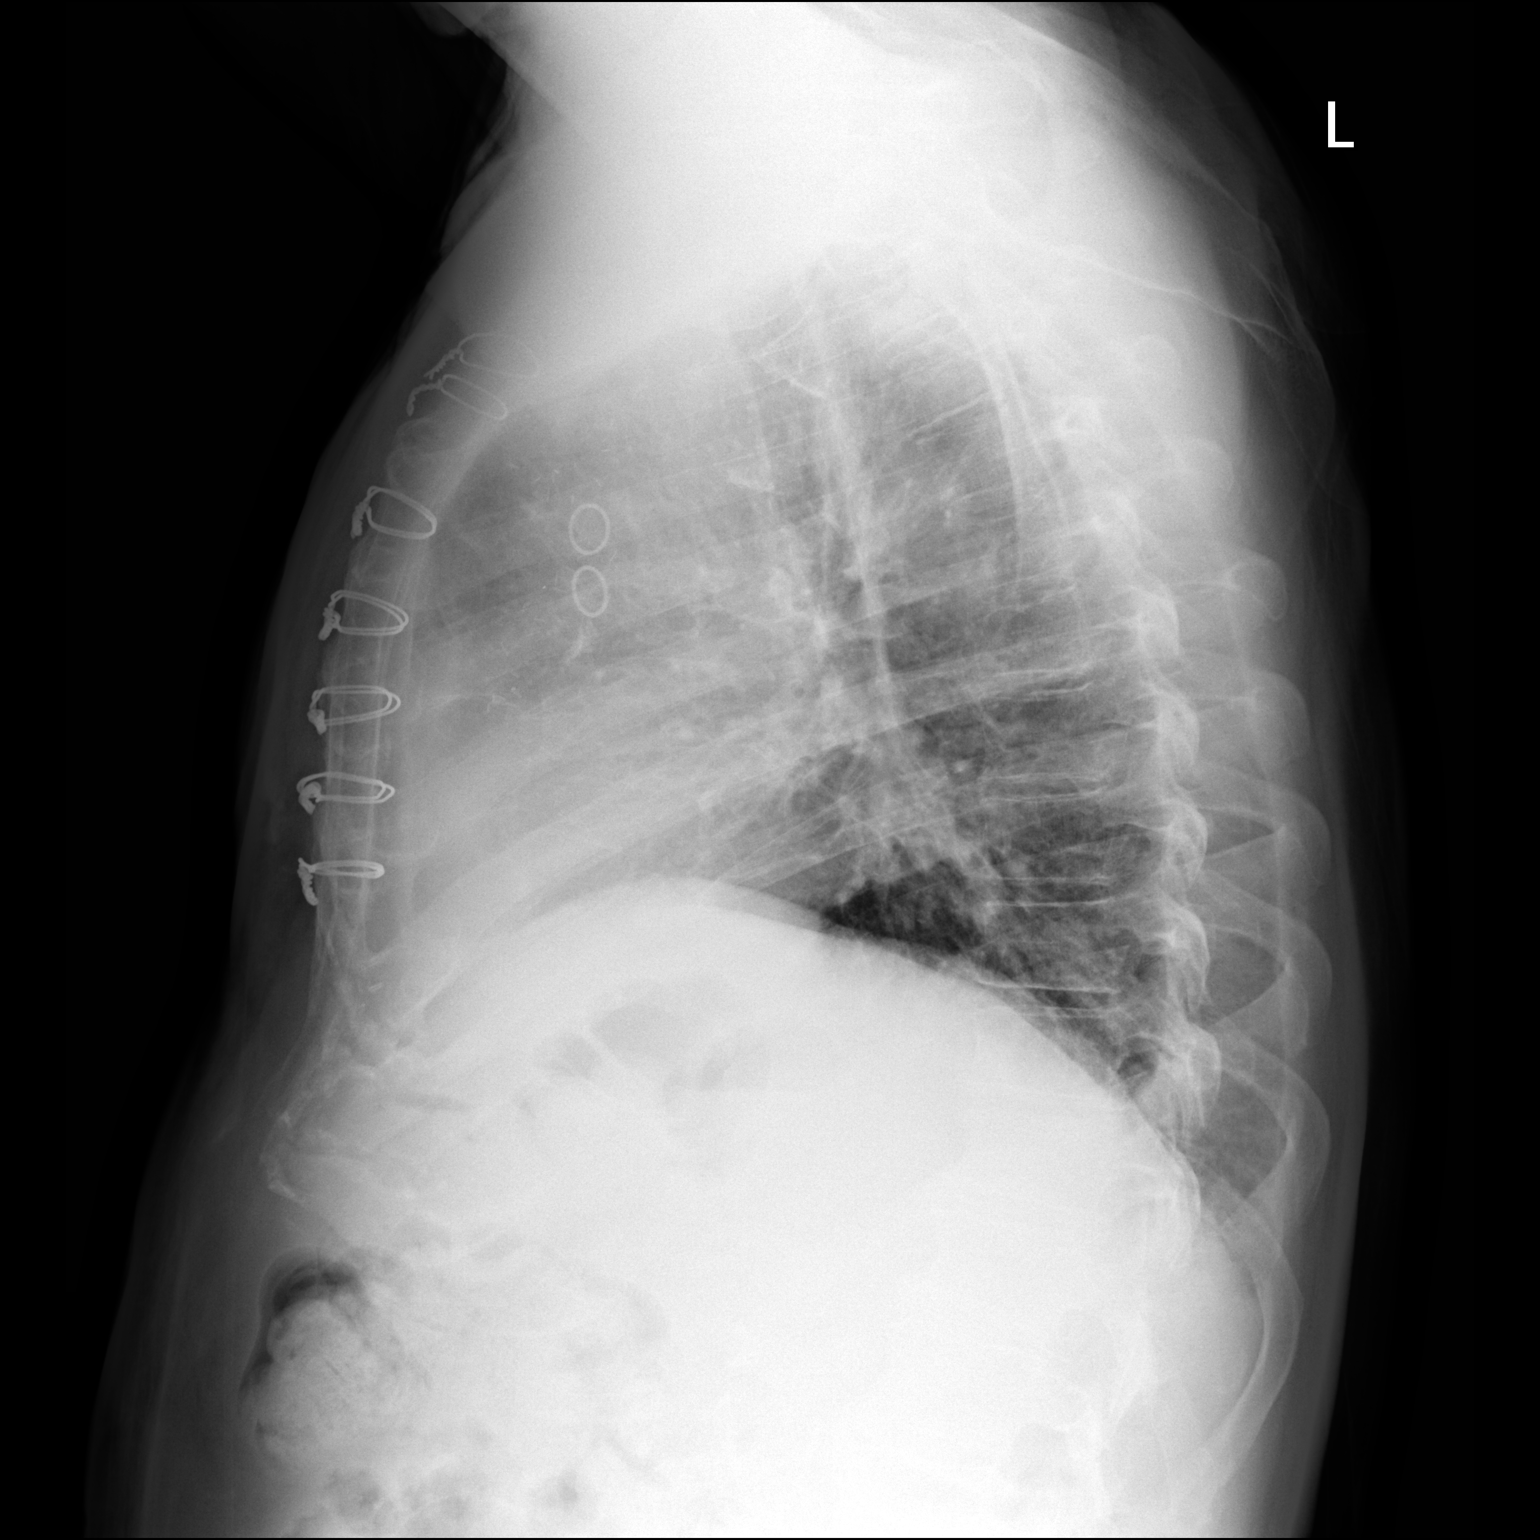

[2 of 2 positions shown; findings below may reference images not displayed]

FINDINGS: Postsurgical changes of prior CABG with similar enlargement the
cardiac silhouette. Calcific atherosclerosis of the aorta. Similar
low lung volumes with bibasilar streaky opacities. No confluent
consolidation. No pleural effusions or pneumothorax.
IMPRESSION: 1. Similar low lung volumes with mild bibasilar atelectasis.
2. Stable cardiomegaly.

## 2021-02-15 ENCOUNTER — Encounter: Payer: Self-pay | Admitting: Emergency Medicine

## 2021-02-15 ENCOUNTER — Emergency Department: Payer: PPO

## 2021-02-15 ENCOUNTER — Inpatient Hospital Stay
Admission: EM | Admit: 2021-02-15 | Discharge: 2021-02-16 | DRG: 271 | Disposition: A | Discharge status: Home / Self Care | Payer: PPO | Attending: Internal Medicine | Admitting: Internal Medicine

## 2021-02-15 ENCOUNTER — Other Ambulatory Visit (INDEPENDENT_AMBULATORY_CARE_PROVIDER_SITE_OTHER): Payer: Self-pay | Admitting: Vascular Surgery

## 2021-02-15 ENCOUNTER — Other Ambulatory Visit: Payer: Self-pay

## 2021-02-15 ENCOUNTER — Encounter
Admission: EM | Disposition: A | Discharge status: Home / Self Care | Payer: Self-pay | Source: Home / Self Care | Attending: Internal Medicine

## 2021-02-15 DIAGNOSIS — Z955 Presence of coronary angioplasty implant and graft: Secondary | ICD-10-CM

## 2021-02-15 DIAGNOSIS — I70222 Atherosclerosis of native arteries of extremities with rest pain, left leg: Secondary | ICD-10-CM | POA: Diagnosis not present

## 2021-02-15 DIAGNOSIS — Z809 Family history of malignant neoplasm, unspecified: Secondary | ICD-10-CM

## 2021-02-15 DIAGNOSIS — Z87891 Personal history of nicotine dependence: Secondary | ICD-10-CM | POA: Diagnosis not present

## 2021-02-15 DIAGNOSIS — I5042 Chronic combined systolic (congestive) and diastolic (congestive) heart failure: Secondary | ICD-10-CM | POA: Diagnosis not present

## 2021-02-15 DIAGNOSIS — M79662 Pain in left lower leg: Secondary | ICD-10-CM | POA: Diagnosis not present

## 2021-02-15 DIAGNOSIS — I252 Old myocardial infarction: Secondary | ICD-10-CM

## 2021-02-15 DIAGNOSIS — Z823 Family history of stroke: Secondary | ICD-10-CM

## 2021-02-15 DIAGNOSIS — Z8711 Personal history of peptic ulcer disease: Secondary | ICD-10-CM | POA: Diagnosis not present

## 2021-02-15 DIAGNOSIS — M19049 Primary osteoarthritis, unspecified hand: Secondary | ICD-10-CM | POA: Diagnosis not present

## 2021-02-15 DIAGNOSIS — Z79899 Other long term (current) drug therapy: Secondary | ICD-10-CM

## 2021-02-15 DIAGNOSIS — I110 Hypertensive heart disease with heart failure: Secondary | ICD-10-CM | POA: Diagnosis present

## 2021-02-15 DIAGNOSIS — K219 Gastro-esophageal reflux disease without esophagitis: Secondary | ICD-10-CM | POA: Diagnosis not present

## 2021-02-15 DIAGNOSIS — Z91041 Radiographic dye allergy status: Secondary | ICD-10-CM | POA: Diagnosis not present

## 2021-02-15 DIAGNOSIS — I70209 Unspecified atherosclerosis of native arteries of extremities, unspecified extremity: Secondary | ICD-10-CM | POA: Diagnosis not present

## 2021-02-15 DIAGNOSIS — Z20822 Contact with and (suspected) exposure to covid-19: Secondary | ICD-10-CM | POA: Diagnosis not present

## 2021-02-15 DIAGNOSIS — M109 Gout, unspecified: Secondary | ICD-10-CM | POA: Diagnosis present

## 2021-02-15 DIAGNOSIS — Z7982 Long term (current) use of aspirin: Secondary | ICD-10-CM | POA: Diagnosis not present

## 2021-02-15 DIAGNOSIS — Z888 Allergy status to other drugs, medicaments and biological substances status: Secondary | ICD-10-CM

## 2021-02-15 DIAGNOSIS — Z951 Presence of aortocoronary bypass graft: Secondary | ICD-10-CM | POA: Diagnosis not present

## 2021-02-15 DIAGNOSIS — Z833 Family history of diabetes mellitus: Secondary | ICD-10-CM

## 2021-02-15 DIAGNOSIS — Z8249 Family history of ischemic heart disease and other diseases of the circulatory system: Secondary | ICD-10-CM

## 2021-02-15 DIAGNOSIS — I998 Other disorder of circulatory system: Secondary | ICD-10-CM

## 2021-02-15 DIAGNOSIS — I10 Essential (primary) hypertension: Secondary | ICD-10-CM | POA: Diagnosis present

## 2021-02-15 DIAGNOSIS — Z789 Other specified health status: Secondary | ICD-10-CM | POA: Diagnosis not present

## 2021-02-15 DIAGNOSIS — I5032 Chronic diastolic (congestive) heart failure: Secondary | ICD-10-CM | POA: Diagnosis not present

## 2021-02-15 DIAGNOSIS — I2510 Atherosclerotic heart disease of native coronary artery without angina pectoris: Secondary | ICD-10-CM | POA: Diagnosis not present

## 2021-02-15 DIAGNOSIS — E785 Hyperlipidemia, unspecified: Secondary | ICD-10-CM | POA: Diagnosis not present

## 2021-02-15 DIAGNOSIS — Y831 Surgical operation with implant of artificial internal device as the cause of abnormal reaction of the patient, or of later complication, without mention of misadventure at the time of the procedure: Secondary | ICD-10-CM | POA: Diagnosis present

## 2021-02-15 DIAGNOSIS — T82898A Other specified complication of vascular prosthetic devices, implants and grafts, initial encounter: Secondary | ICD-10-CM | POA: Diagnosis not present

## 2021-02-15 DIAGNOSIS — I739 Peripheral vascular disease, unspecified: Secondary | ICD-10-CM

## 2021-02-15 DIAGNOSIS — I70202 Unspecified atherosclerosis of native arteries of extremities, left leg: Secondary | ICD-10-CM | POA: Diagnosis not present

## 2021-02-15 DIAGNOSIS — I7090 Unspecified atherosclerosis: Secondary | ICD-10-CM

## 2021-02-15 HISTORY — PX: LOWER EXTREMITY ANGIOGRAPHY: CATH118251

## 2021-02-15 LAB — CBC
HCT: 44.6 % (ref 39.0–52.0)
Hemoglobin: 14.3 g/dL (ref 13.0–17.0)
MCH: 29.8 pg (ref 26.0–34.0)
MCHC: 32.1 g/dL (ref 30.0–36.0)
MCV: 92.9 fL (ref 80.0–100.0)
Platelets: 153 10*3/uL (ref 150–400)
RBC: 4.8 MIL/uL (ref 4.22–5.81)
RDW: 14.9 % (ref 11.5–15.5)
WBC: 4.1 10*3/uL (ref 4.0–10.5)
nRBC: 0 % (ref 0.0–0.2)

## 2021-02-15 LAB — COMPREHENSIVE METABOLIC PANEL
ALT: 25 U/L (ref 0–44)
AST: 23 U/L (ref 15–41)
Albumin: 4.2 g/dL (ref 3.5–5.0)
Alkaline Phosphatase: 80 U/L (ref 38–126)
Anion gap: 4 — ABNORMAL LOW (ref 5–15)
BUN: 26 mg/dL — ABNORMAL HIGH (ref 8–23)
CO2: 26 mmol/L (ref 22–32)
Calcium: 8.9 mg/dL (ref 8.9–10.3)
Chloride: 107 mmol/L (ref 98–111)
Creatinine, Ser: 0.98 mg/dL (ref 0.61–1.24)
GFR, Estimated: >60 mL/min (ref >60)
Glucose, Bld: 102 mg/dL — ABNORMAL HIGH (ref 70–99)
Potassium: 4.6 mmol/L (ref 3.5–5.1)
Sodium: 137 mmol/L (ref 135–145)
Total Bilirubin: 0.9 mg/dL (ref 0.3–1.2)
Total Protein: 7.4 g/dL (ref 6.5–8.1)

## 2021-02-15 LAB — RESP PANEL BY RT-PCR (FLU A&B, COVID) ARPGX2
Influenza A by PCR: NEGATIVE
Influenza B by PCR: NEGATIVE
SARS Coronavirus 2 by RT PCR: NEGATIVE

## 2021-02-15 LAB — PROTIME-INR
INR: 0.9 (ref 0.8–1.2)
Prothrombin Time: 12.6 seconds (ref 11.4–15.2)

## 2021-02-15 LAB — BASIC METABOLIC PANEL
Anion gap: 7 (ref 5–15)
BUN: 26 mg/dL — ABNORMAL HIGH (ref 8–23)
CO2: 24 mmol/L (ref 22–32)
Calcium: 8.8 mg/dL — ABNORMAL LOW (ref 8.9–10.3)
Chloride: 108 mmol/L (ref 98–111)
Creatinine, Ser: 0.86 mg/dL (ref 0.61–1.24)
GFR, Estimated: >60 mL/min (ref >60)
Glucose, Bld: 202 mg/dL — ABNORMAL HIGH (ref 70–99)
Potassium: 4.5 mmol/L (ref 3.5–5.1)
Sodium: 139 mmol/L (ref 135–145)

## 2021-02-15 LAB — HEPARIN LEVEL (UNFRACTIONATED): Heparin Unfractionated: <0.1 IU/mL — ABNORMAL LOW (ref 0.30–0.70)

## 2021-02-15 LAB — BRAIN NATRIURETIC PEPTIDE: B Natriuretic Peptide: 415.4 pg/mL — ABNORMAL HIGH (ref 0.0–100.0)

## 2021-02-15 LAB — APTT: aPTT: 28 seconds (ref 24–36)

## 2021-02-15 SURGERY — LOWER EXTREMITY ANGIOGRAPHY
Anesthesia: Moderate Sedation

## 2021-02-15 MED ORDER — NITROGLYCERIN 0.4 MG SL SUBL: 0.4000 mg | SUBLINGUAL_TABLET | SUBLINGUAL | Status: DC | PRN

## 2021-02-15 MED ORDER — CEFAZOLIN SODIUM-DEXTROSE 2-4 GM/100ML-% IV SOLN: 2.0000 g | Freq: Once | INTRAVENOUS | Status: AC

## 2021-02-15 MED ORDER — CARVEDILOL 12.5 MG PO TABS
12.5000 mg | ORAL_TABLET | Freq: Two times a day (BID) | ORAL | Status: DC
  Administered 2021-02-16: 08:00:00 12.5 mg via ORAL
  Filled 2021-02-15: qty 1

## 2021-02-15 MED ORDER — METHYLPREDNISOLONE SODIUM SUCC 125 MG IJ SOLR: 125.0000 mg | Freq: Once | INTRAMUSCULAR | Status: AC | PRN

## 2021-02-15 MED ORDER — TIROFIBAN HCL IV 12.5 MG/250 ML
INTRAVENOUS | Status: AC
  Administered 2021-02-15: 17:00:00 0.15 ug/kg/min via INTRAVENOUS
  Filled 2021-02-15: qty 250

## 2021-02-15 MED ORDER — FAMOTIDINE 20 MG PO TABS: 40.0000 mg | ORAL_TABLET | Freq: Once | ORAL | Status: DC | PRN

## 2021-02-15 MED ORDER — ASPIRIN EC 81 MG PO TBEC
81.0000 mg | DELAYED_RELEASE_TABLET | Freq: Every day | ORAL | Status: DC
  Administered 2021-02-15: 22:00:00 81 mg via ORAL
  Filled 2021-02-15: qty 1

## 2021-02-15 MED ORDER — ACETAMINOPHEN 160 MG/5ML PO SOLN
650.0000 mg | Freq: Four times a day (QID) | ORAL | Status: DC | PRN
  Filled 2021-02-15: qty 20.3

## 2021-02-15 MED ORDER — MIDAZOLAM HCL 2 MG/2ML IJ SOLN
INTRAMUSCULAR | Status: AC
  Filled 2021-02-15: qty 2

## 2021-02-15 MED ORDER — ACETAMINOPHEN 500 MG PO TABS
1000.0000 mg | ORAL_TABLET | Freq: Once | ORAL | Status: AC
  Administered 2021-02-15: 12:00:00 1000 mg via ORAL
  Filled 2021-02-15: qty 2

## 2021-02-15 MED ORDER — MIDAZOLAM HCL 2 MG/ML PO SYRP
8.0000 mg | ORAL_SOLUTION | Freq: Once | ORAL | Status: DC | PRN
  Filled 2021-02-15: qty 4

## 2021-02-15 MED ORDER — HEPARIN BOLUS VIA INFUSION
4000.0000 [IU] | Freq: Once | INTRAVENOUS | Status: AC
  Administered 2021-02-15: 10:00:00 4000 [IU] via INTRAVENOUS
  Filled 2021-02-15: qty 4000

## 2021-02-15 MED ORDER — SODIUM CHLORIDE 0.9 % IV SOLN: INTRAVENOUS | Status: DC

## 2021-02-15 MED ORDER — FENTANYL CITRATE (PF) 100 MCG/2ML IJ SOLN
INTRAMUSCULAR | Status: DC | PRN
  Administered 2021-02-15 (×2): 50 ug via INTRAVENOUS

## 2021-02-15 MED ORDER — HEPARIN (PORCINE) 25000 UT/250ML-% IV SOLN
1000.0000 [IU]/h | INTRAVENOUS | Status: DC
  Administered 2021-02-15: 10:00:00 1000 [IU]/h via INTRAVENOUS
  Filled 2021-02-15: qty 250

## 2021-02-15 MED ORDER — DIPHENHYDRAMINE HCL 50 MG/ML IJ SOLN
INTRAMUSCULAR | Status: AC
  Administered 2021-02-15: 15:00:00 50 mg via INTRAVENOUS
  Filled 2021-02-15: qty 1

## 2021-02-15 MED ORDER — TIROFIBAN HCL IV 12.5 MG/250 ML
0.1500 ug/kg/min | INTRAVENOUS | Status: AC
Start: 2021-02-15 — End: 2021-02-16
  Administered 2021-02-15: 21:00:00 0.15 ug/kg/min via INTRAVENOUS
  Filled 2021-02-15 (×2): qty 250

## 2021-02-15 MED ORDER — SACUBITRIL-VALSARTAN 49-51 MG PO TABS
1.0000 | ORAL_TABLET | Freq: Two times a day (BID) | ORAL | Status: DC
  Administered 2021-02-15 – 2021-02-16 (×2): 1 via ORAL
  Filled 2021-02-15 (×2): qty 1

## 2021-02-15 MED ORDER — MORPHINE SULFATE (PF) 2 MG/ML IV SOLN: 2.0000 mg | INTRAVENOUS | Status: DC | PRN

## 2021-02-15 MED ORDER — THIAMINE HCL 100 MG PO TABS
100.0000 mg | ORAL_TABLET | Freq: Every day | ORAL | Status: DC
  Administered 2021-02-15 – 2021-02-16 (×2): 100 mg via ORAL
  Filled 2021-02-15 (×2): qty 1

## 2021-02-15 MED ORDER — FAMOTIDINE 20 MG PO TABS
ORAL_TABLET | ORAL | Status: AC
  Administered 2021-02-15: 15:00:00 40 mg
  Filled 2021-02-15: qty 2

## 2021-02-15 MED ORDER — LORAZEPAM 2 MG/ML IJ SOLN: 0.0000 mg | Freq: Two times a day (BID) | INTRAMUSCULAR | Status: DC

## 2021-02-15 MED ORDER — THIAMINE HCL 100 MG/ML IJ SOLN: 100.0000 mg | Freq: Every day | INTRAMUSCULAR | Status: DC

## 2021-02-15 MED ORDER — LORAZEPAM 2 MG/ML IJ SOLN: 1.0000 mg | INTRAMUSCULAR | Status: DC | PRN

## 2021-02-15 MED ORDER — HYDRALAZINE HCL 20 MG/ML IJ SOLN: 5.0000 mg | INTRAMUSCULAR | Status: DC | PRN

## 2021-02-15 MED ORDER — MIDAZOLAM HCL 2 MG/2ML IJ SOLN
INTRAMUSCULAR | Status: DC | PRN
  Administered 2021-02-15 (×2): 2 mg via INTRAVENOUS

## 2021-02-15 MED ORDER — TRAZODONE HCL 50 MG PO TABS: 50.0000 mg | ORAL_TABLET | Freq: Every evening | ORAL | Status: DC | PRN

## 2021-02-15 MED ORDER — FENTANYL CITRATE (PF) 100 MCG/2ML IJ SOLN
INTRAMUSCULAR | Status: AC
  Filled 2021-02-15: qty 2

## 2021-02-15 MED ORDER — TIROFIBAN HCL IN NACL 5-0.9 MG/100ML-% IV SOLN
0.1500 ug/kg/min | INTRAVENOUS | Status: DC
  Filled 2021-02-15 (×3): qty 100

## 2021-02-15 MED ORDER — ONDANSETRON HCL 4 MG/2ML IJ SOLN: 4.0000 mg | Freq: Three times a day (TID) | INTRAMUSCULAR | Status: DC | PRN

## 2021-02-15 MED ORDER — LORAZEPAM 2 MG/ML IJ SOLN: 0.0000 mg | Freq: Four times a day (QID) | INTRAMUSCULAR | Status: DC

## 2021-02-15 MED ORDER — LORAZEPAM 1 MG PO TABS: 1.0000 mg | ORAL_TABLET | ORAL | Status: DC | PRN

## 2021-02-15 MED ORDER — OXYCODONE-ACETAMINOPHEN 5-325 MG PO TABS: 1.0000 | ORAL_TABLET | ORAL | Status: DC | PRN

## 2021-02-15 MED ORDER — DIPHENHYDRAMINE HCL 50 MG/ML IJ SOLN: 50.0000 mg | Freq: Once | INTRAMUSCULAR | Status: AC | PRN

## 2021-02-15 MED ORDER — ALTEPLASE 2 MG IJ SOLR
INTRAMUSCULAR | Status: AC
  Filled 2021-02-15: qty 8

## 2021-02-15 MED ORDER — HYDROMORPHONE HCL 1 MG/ML IJ SOLN: 1.0000 mg | Freq: Once | INTRAMUSCULAR | Status: DC | PRN

## 2021-02-15 MED ORDER — METHYLPREDNISOLONE SODIUM SUCC 125 MG IJ SOLR
INTRAMUSCULAR | Status: AC
  Administered 2021-02-15: 15:00:00 125 mg via INTRAVENOUS
  Filled 2021-02-15: qty 2

## 2021-02-15 MED ORDER — ADULT MULTIVITAMIN W/MINERALS CH
1.0000 | ORAL_TABLET | Freq: Every day | ORAL | Status: DC
  Administered 2021-02-15 – 2021-02-16 (×2): 1 via ORAL
  Filled 2021-02-15 (×2): qty 1

## 2021-02-15 MED ORDER — ONDANSETRON HCL 4 MG/2ML IJ SOLN: 4.0000 mg | Freq: Four times a day (QID) | INTRAMUSCULAR | Status: DC | PRN

## 2021-02-15 MED ORDER — FOLIC ACID 1 MG PO TABS
1.0000 mg | ORAL_TABLET | Freq: Every day | ORAL | Status: DC
  Administered 2021-02-15 – 2021-02-16 (×2): 1 mg via ORAL
  Filled 2021-02-15 (×2): qty 1

## 2021-02-15 MED ORDER — CEFAZOLIN SODIUM-DEXTROSE 2-4 GM/100ML-% IV SOLN
INTRAVENOUS | Status: AC
  Administered 2021-02-15: 15:00:00 2 g via INTRAVENOUS
  Filled 2021-02-15: qty 100

## 2021-02-15 MED ORDER — HEPARIN SODIUM (PORCINE) 1000 UNIT/ML IJ SOLN
INTRAMUSCULAR | Status: AC
  Filled 2021-02-15: qty 10

## 2021-02-15 MED ORDER — IODIXANOL 320 MG/ML IV SOLN
INTRAVENOUS | Status: DC | PRN
  Administered 2021-02-15: 16:00:00 50 mL via INTRA_ARTERIAL

## 2021-02-15 MED ORDER — TIROFIBAN (AGGRASTAT) BOLUS VIA INFUSION
25.0000 ug/kg | Freq: Once | INTRAVENOUS | Status: AC
  Administered 2021-02-15: 17:00:00 2040 ug via INTRAVENOUS
  Filled 2021-02-15: qty 41

## 2021-02-15 MED ORDER — ALTEPLASE 2 MG IJ SOLR
INTRAMUSCULAR | Status: DC | PRN
  Administered 2021-02-15: 8 mg

## 2021-02-15 SURGICAL SUPPLY — 22 items
BALLN LUTONIX 018 5X40X130 (BALLOONS)
BALLN LUTONIX DCB 5X40X130 (BALLOONS) ×3
BALLN ULTRVRSE 2.5X220X150 (BALLOONS) ×3
BALLOON LUTONIX 018 5X40X130 (BALLOONS) IMPLANT
BALLOON LUTONIX DCB 5X40X130 (BALLOONS) IMPLANT
BALLOON ULTRVRSE 2.5X220X150 (BALLOONS) IMPLANT
CANISTER PENUMBRA ENGINE (MISCELLANEOUS) ×2 IMPLANT
CATH LIGHTNING 7 XTORQ 130 (CATHETERS) ×2 IMPLANT
CATH PIG 70CM (CATHETERS) ×2 IMPLANT
CATH VERT 5FR 125CM (CATHETERS) ×2 IMPLANT
COVER PROBE U/S 5X48 (MISCELLANEOUS) ×2 IMPLANT
DEVICE STARCLOSE SE CLOSURE (Vascular Products) ×2 IMPLANT
GLIDEWIRE ADV .035X260CM (WIRE) ×2 IMPLANT
KIT ENCORE 26 ADVANTAGE (KITS) ×2 IMPLANT
PACK ANGIOGRAPHY (CUSTOM PROCEDURE TRAY) ×4 IMPLANT
SHEATH PINNACLE 5F 10CM (SHEATH) ×2 IMPLANT
SHEATH PINNACLE MP 7F 45CM (SHEATH) ×2 IMPLANT
STENT VIABAHN 6X7.5X120 (Permanent Stent) ×2 IMPLANT
SYR MEDRAD MARK 7 150ML (SYRINGE) ×2 IMPLANT
TUBING CONTRAST HIGH PRESS 72 (TUBING) ×2 IMPLANT
WIRE G V18X300CM (WIRE) ×2 IMPLANT
WIRE GUIDERIGHT .035X150 (WIRE) ×2 IMPLANT

## 2021-02-15 NOTE — Consult Note (Signed)
[facility] Vascular Consult Note  MRN : [id number]  [name] is a 70 y.o. (1951-02-13) male who presents with chief complaint of  Chief Complaint  Patient presents with   Leg Pain   History of Present Illness:  [name] is a 70 y.o. male with a past medical history of CHF, CAD status post multiple revascularization procedures of his left lower extremity presents to the emergency department for left foot discomfort and paleness.    According to the patient 2 days ago on Saturday he crouched down and shortly afterwards noted some pain to the left foot.  States he has had intermittent pain in paleness and appearance of the left foot ever since.  Patient notes progressively worsening left lower extremity discomfort which prompted him to seek medical attention in emergency department.  Denies any wound formation.  Vascular surgery was consulted by Dr. Blaine Hamper for possible endovascular intervention.  Current Facility-Administered Medications  Medication Dose Route Frequency Provider Last Rate Last Admin   0.9 %  sodium chloride infusion   Intravenous Continuous Ivor Costa, MD 75 mL/hr at 02/15/21 1033 New Bag at 02/15/21 1033   acetaminophen (TYLENOL) 160 MG/5ML solution 650 mg  650 mg Oral Q6H PRN Ivor Costa, MD       aspirin EC tablet 81 mg  81 mg Oral QHS Ivor Costa, MD       carvedilol (COREG) tablet 12.5 mg  12.5 mg Oral BID Ivor Costa, MD       heparin ADULT infusion 100 units/mL (25000 units/235mL)  1,000 Units/hr Intravenous Continuous Ivor Costa, MD 10 mL/hr at 02/15/21 1029 1,000 Units/hr at 02/15/21 1029   hydrALAZINE (APRESOLINE) injection 5 mg  5 mg Intravenous Q2H PRN Ivor Costa, MD       morphine 2 MG/ML injection 2 mg  2 mg Intravenous Q4H PRN Ivor Costa, MD       nitroGLYCERIN (NITROSTAT) SL tablet 0.4 mg  0.4 mg Sublingual Q5 min PRN Ivor Costa, MD       ondansetron [facility]) injection 4 mg  4 mg Intravenous Q8H PRN Ivor Costa, MD        oxyCODONE-acetaminophen (PERCOCET/ROXICET) 5-325 MG per tablet 1 tablet  1 tablet Oral Q4H PRN Ivor Costa, MD       sacubitril-valsartan (ENTRESTO) 49-51 mg per tablet  1 tablet Oral BID Ivor Costa, MD       traZODone (DESYREL) tablet 50 mg  50 mg Oral QHS PRN Ivor Costa, MD       Current Outpatient Medications  Medication Sig Dispense Refill   aspirin EC 81 MG tablet Take 1 tablet (81 mg total) by mouth daily. (Patient taking differently: Take 81 mg by mouth at bedtime.) 150 tablet 2   carvedilol (COREG) 12.5 MG tablet Take 1 tablet (12.5 mg total) by mouth 2 (two) times daily. 180 tablet 3   colchicine 0.6 MG tablet Take 1 tablet (0.6 mg total) by mouth 2 (two) times daily. 30 tablet 0   sacubitril-valsartan (ENTRESTO) 49-51 MG Take 1 tablet by mouth 2 (two) times daily. Need to make an appointment for further refills 180 tablet 3   traZODone (DESYREL) 50 MG tablet TAKE 1 TABLET BY MOUTH AT BEDTIME AS NEEDED FOR SLEEP. 90 tablet 1   Alirocumab (PRALUENT) 150 MG/ML SOAJ Inject 1 pen into the skin every 14 (fourteen) days. 2 mL 2   gabapentin (NEURONTIN) 300 MG capsule Take 1 capsule (300 mg total) by mouth at bedtime. (  Patient not taking: Reported on 02/15/2021) 30 capsule 0   nitroGLYCERIN (NITROSTAT) 0.4 MG SL tablet Place 1 tablet (0.4 mg total) under the tongue every 5 (five) minutes as needed for chest pain. 25 tablet 2   oxyCODONE (OXY IR/ROXICODONE) 5 MG immediate release tablet Take 1-2 tablets (5-10 mg total) by mouth every 6 (six) hours as needed for severe pain or moderate pain. (Patient not taking: Reported on 02/15/2021) 50 tablet 0   silver sulfADIAZINE (SILVADENE) 1 % cream Apply 1 application topically daily. (Patient not taking: Reported on 02/15/2021) 50 g 0   Past Medical History:  Diagnosis Date   Abnormal nuclear cardiac imaging test 08/08/2015   Arthritis    fingers   Carotid artery occlusion    CHF (congestive heart failure) (HCC)    Coronary atherosclerosis of native  coronary artery 01/29/2013   11/05/19 R/LHC 80% dLMCA stenosis small diffusely dz dLAD, chronically occluded OM1 50%mRCA lesion, widely patent mLCx strent, moderately elevated L heart filling pressures, mild to moderate RH filling pressures, normal to moderately reduced CO   Duodenal erosion    Encounter for screening for lung cancer 07/13/2016   Esophageal stenosis    esophageal dilation   GERD (gastroesophageal reflux disease)    H. pylori infection    Heart attack ([place]) Oct. 2009   Mild   Hiatal hernia    Hyperlipidemia    Hypertension    Old myocardial infarction 11/29/2007   Mildly elevated troponin, isolated value in October 2009. Cardiac catheterization-nonobstructive 60% RCA disease-subsequent nuclear stress test-9 minutes, low risk, mild inferior wall hypokinesis    Pain in limb 12/19/2017   Peripheral vascular disease ([place])    Unstable angina ([place]) 11/25/2017   Past Surgical History:  Procedure Laterality Date   APPENDECTOMY     BACK SURGERY     BIV ICD INSERTION CRT-D N/A 03/23/2020   Procedure: BIV ICD INSERTION CRT-D;  Surgeon: Vickie Epley, MD;  Location: [place] [facility];  Service: Cardiovascular;  Laterality: N/A;   CARDIAC CATHETERIZATION N/A 08/07/2015   Procedure: Left Heart Cath and Coronary Angiography;  Surgeon: [name], MD;  Location: [place] [facility];  Service: Cardiovascular;  Laterality: N/A;   CARDIAC CATHETERIZATION N/A 08/07/2015   Procedure: Coronary Stent Intervention;  Surgeon: [name], MD;  Location: [place] [facility];  Service: Cardiovascular;  Laterality: N/A;   CARDIAC CATHETERIZATION N/A 08/07/2015   Procedure: Coronary Stent Intervention;  Surgeon: [name], MD;  Location: [place] [facility];  Service: Cardiovascular;  Laterality: N/A;   CAROTID ENDARTERECTOMY  01/05/2006   Right  CEA with DPA   CATARACT EXTRACTION W/ INTRAOCULAR LENS IMPLANT Left 12/04/2017   CATARACT EXTRACTION W/PHACO Left 12/04/2017   Procedure: CATARACT  EXTRACTION PHACO AND INTRAOCULAR LENS PLACEMENT ([place]) LEFT;  Surgeon: Eulogio Bear, MD;  Location: [place];  Service: Ophthalmology;  Laterality: Left;   CATARACT EXTRACTION W/PHACO Right 02/06/2018   Procedure: CATARACT EXTRACTION PHACO AND INTRAOCULAR LENS PLACEMENT (IOC)RIGHT;  Surgeon: Eulogio Bear, MD;  Location: [place];  Service: Ophthalmology;  Laterality: Right;   COLONOSCOPY  05/20/2008   COLONOSCOPY WITH PROPOFOL N/A 09/13/2018   Procedure: COLONOSCOPY WITH BIOPSY;  Surgeon: [name], MD;  Location: [place];  Service: Endoscopy;  Laterality: N/A;   CORONARY ARTERY BYPASS GRAFT N/A 11/11/2019   Procedure: CORONARY ARTERY BYPASS GRAFTING (CABG) USING LIMA to Diag1; ENDOSCOPICALLY HARVESTED RIGHT GREATER SAPHENOUS VEIN: SVG to OM1; SVG to OM2;  SVG to PDA.;  Surgeon: Ivin Poot, MD;  Location: [place];  Service: Open Heart Surgery;  Laterality: N/A;   CORONARY STENT PLACEMENT  08/07/2015   MID CIRCUMFLEX   ENDARTERECTOMY FEMORAL Bilateral 09/30/2020   Procedure: ENDARTERECTOMY FEMORAL;  Surgeon: Algernon Huxley, MD;  Location: [facility];  Service: Vascular;  Laterality: Bilateral;   ENDOVEIN HARVEST OF GREATER SAPHENOUS VEIN Right 11/11/2019   Procedure: ENDOVEIN HARVEST OF GREATER SAPHENOUS VEIN;  Surgeon: Ivin Poot, MD;  Location: [place];  Service: Open Heart Surgery;  Laterality: Right;   ESOPHAGOGASTRODUODENOSCOPY (EGD) WITH PROPOFOL N/A 02/11/2019   Procedure: ESOPHAGOGASTRODUODENOSCOPY (EGD) WITH BIOPSY and  Dilation;  Surgeon: [name], MD;  Location: [place];  Service: Endoscopy;  Laterality: N/A;   HIP SURGERY Left 10/2016   left hip tendon repair   LEFT HEART CATH AND CORONARY ANGIOGRAPHY N/A 11/27/2017   Procedure: LEFT HEART CATH AND CORONARY ANGIOGRAPHY;  Surgeon: Wellington Hampshire, MD;  Location: [place] [facility];  Service: Cardiovascular;  Laterality: N/A;   LOWER EXTREMITY ANGIOGRAPHY Left 02/12/2018    Procedure: LOWER EXTREMITY ANGIOGRAPHY;  Surgeon: Algernon Huxley, MD;  Location: [place] [facility];  Service: Cardiovascular;  Laterality: Left;   LOWER EXTREMITY ANGIOGRAPHY Left 03/07/2018   Procedure: LOWER EXTREMITY ANGIOGRAPHY;  Surgeon: Algernon Huxley, MD;  Location: [place] [facility];  Service: Cardiovascular;  Laterality: Left;   LOWER EXTREMITY ANGIOGRAPHY Left 06/04/2018   Procedure: LOWER EXTREMITY ANGIOGRAPHY;  Surgeon: Algernon Huxley, MD;  Location: [place] [facility];  Service: Cardiovascular;  Laterality: Left;   LOWER EXTREMITY ANGIOGRAPHY Left 09/17/2020   Procedure: LOWER EXTREMITY ANGIOGRAPHY;  Surgeon: Algernon Huxley, MD;  Location: [place] [facility];  Service: Cardiovascular;  Laterality: Left;   LOWER EXTREMITY ANGIOGRAPHY Left 09/28/2020   Procedure: LOWER EXTREMITY ANGIOGRAPHY;  Surgeon: Algernon Huxley, MD;  Location: [place] [facility];  Service: Cardiovascular;  Laterality: Left;   PLACEMENT OF IMPELLA LEFT VENTRICULAR ASSIST DEVICE N/A 11/11/2019   Procedure: PLACEMENT OF IMPELLA LEFT VENTRICULAR ASSIST DEVICE 5.5;  Surgeon: Ivin Poot, MD;  Location: [place];  Service: Open Heart Surgery;  Laterality: N/A;  Midline Sternotomy   POLYPECTOMY N/A 09/13/2018   Procedure: POLYPECTOMY;  Surgeon: [name], MD;  Location: [place];  Service: Endoscopy;  Laterality: N/A;   POLYPECTOMY N/A 02/11/2019   Procedure: POLYPECTOMY;  Surgeon: [name], MD;  Location: [place];  Service: Endoscopy;  Laterality: N/A;   REMOVAL OF IMPELLA LEFT VENTRICULAR ASSIST DEVICE N/A 11/15/2019   Procedure: REMOVAL OF IMPELLA 5.5 LEFT VENTRICULAR ASSIST DEVICE;  Surgeon: Ivin Poot, MD;  Location: [place];  Service: Open Heart Surgery;  Laterality: N/A;   RIGHT/LEFT HEART CATH AND CORONARY ANGIOGRAPHY N/A 11/05/2019   Procedure: RIGHT/LEFT HEART CATH AND CORONARY ANGIOGRAPHY;  Surgeon: Nelva Bush, MD;  Location: [place] [facility];  Service: Cardiovascular;   Laterality: N/A;   SPINE SURGERY     TEE WITHOUT CARDIOVERSION N/A 11/11/2019   Procedure: TRANSESOPHAGEAL ECHOCARDIOGRAM (TEE);  Surgeon: Prescott Gum, Collier Salina, MD;  Location: [place];  Service: Open Heart Surgery;  Laterality: N/A;   TEE WITHOUT CARDIOVERSION N/A 11/15/2019   Procedure: TRANSESOPHAGEAL ECHOCARDIOGRAM (TEE);  Surgeon: Prescott Gum, Collier Salina, MD;  Location: [place];  Service: Open Heart Surgery;  Laterality: N/A;   TONSILLECTOMY     Social History Social History   Tobacco Use   Smoking status: Former    Packs/day: 1.25  Years: 35.00    Pack years: 43.75    Types: Cigarettes    Quit date: 02/28/2005    Years since quitting: 15.9   Smokeless tobacco: Current    Types: Snuff   Tobacco comments:    occaisionally  Vaping Use   Vaping Use: Never used  Substance Use Topics   Alcohol use: Yes    Alcohol/week: 8.0 - 10.0 standard drinks    Types: 8 - 10 Glasses of wine per week    Comment: weekly   Drug use: No   Family History Family History  Problem Relation Age of Onset   Heart attack Mother    Coronary artery disease Mother    Heart disease Mother        Carotid Stenosis and BPG and Heart Disease before age 63   Diabetes Mother    Hypertension Mother    Heart attack Father    Heart disease Father        BPG and Heart Disease before age 21   Hypertension Father    Cancer Father 71       throat   Stroke Father    Colon cancer Neg Hx    Colon polyps Neg Hx    Esophageal cancer Neg Hx    Rectal cancer Neg Hx    Stomach cancer Neg Hx   Denies family history of peripheral artery disease, venous disease or renal disease.  Allergies  Allergen Reactions   Brilinta [Ticagrelor] Shortness Of Breath   Chlorhexidine Gluconate Other (See Comments)    Skin burning for hours afterward   Contrast Media [Iodinated Diagnostic Agents] Itching    Face and head flushing, nose itching after contrast administation for angiogram   Statins Other (See Comments)    Failed Crestor 5 mg  twice weekly, Crestor 20 mg daily, Pravastatin 40 mg qd, Lipitor, Zocor - muscle aches   Zetia [Ezetimibe] Other (See Comments)    Muscle aches   REVIEW OF SYSTEMS (Negative unless checked)  Constitutional: [] Weight loss  [] Fever  [] Chills Cardiac: [] Chest pain   [] Chest pressure   [] Palpitations   [] Shortness of breath when laying flat   [] Shortness of breath at rest   [] Shortness of breath with exertion. Vascular:  [] Pain in legs with walking   [] Pain in legs at rest   [] Pain in legs when laying flat   [] Claudication   [x] Pain in feet when walking  [x] Pain in feet at rest  [x] Pain in feet when laying flat   [] History of DVT   [] Phlebitis   [] Swelling in legs   [] Varicose veins   [] Non-healing ulcers Pulmonary:   [] Uses home oxygen   [] Productive cough   [] Hemoptysis   [] Wheeze  [] COPD   [] Asthma Neurologic:  [] Dizziness  [] Blackouts   [] Seizures   [] History of stroke   [] History of TIA  [] Aphasia   [] Temporary blindness   [] Dysphagia   [] Weakness or numbness in arms   [] Weakness or numbness in legs Musculoskeletal:  [] Arthritis   [] Joint swelling   [] Joint pain   [] Low back pain Hematologic:  [] Easy bruising  [] Easy bleeding   [] Hypercoagulable state   [] Anemic  [] Hepatitis Gastrointestinal:  [] Blood in stool   [] Vomiting blood  [] Gastroesophageal reflux/heartburn   [] Difficulty swallowing. Genitourinary:  [] Chronic kidney disease   [] Difficult urination  [] Frequent urination  [] Burning with urination   [] Blood in urine Skin:  [] Rashes   [] Ulcers   [] Wounds Psychological:  [] History of anxiety   []  History of major  depression.  Physical Examination  Vitals:   02/15/21 0808 02/15/21 0811 02/15/21 0812 02/15/21 1015  BP:   94/73 129/85  Pulse:   65 62  Resp:   17 18  Temp:   98.4 F (36.9 C)   TempSrc:   Oral   SpO2:   93% 94%  Weight: 85 kg 81.6 kg    Height: 5\' 6"  (1.676 m) 5\' 6"  (1.676 m)     Body mass index is 29.05 kg/m. Gen:  WD/WN, NAD Head: [place]/AT, No temporalis wasting.  Prominent temp pulse not noted. Ear/Nose/Throat: Hearing grossly intact, nares w/o erythema or drainage, oropharynx w/o Erythema/Exudate Eyes: Sclera non-icteric, conjunctiva clear Neck: Trachea midline.  No JVD.  Pulmonary:  Good air movement, respirations not labored, equal bilaterally.  Cardiac: RRR, normal S1, S2. Vascular:  Vessel Right Left  Radial Palpable Palpable  Ulnar Palpable Palpable  Brachial Palpable Palpable  Carotid Palpable, without bruit Palpable, without bruit  Aorta Not palpable N/A  Femoral Palpable Palpable  Popliteal Palpable Palpable  PT Palpable Non-Palpable  DP Palpable Non-Palpable   Left lower extremity: Thigh soft.  Calf soft.  The extremity is warm however transition cooler at the calf.  Foot is cold.  Unable to palpate pedal pulses.  Motor/sensory is intact.  Gastrointestinal: soft, non-tender/non-distended. No guarding/reflex.  Musculoskeletal: M/S 5/5 throughout.  Extremities without ischemic changes.  No deformity or atrophy. No edema. Neurologic: Sensation grossly intact in extremities.  Symmetrical.  Speech is fluent. Motor exam as listed above. Psychiatric: Judgment intact, Mood & affect appropriate for pt's clinical situation. Dermatologic: No rashes or ulcers noted.  No cellulitis or open wounds. Lymph : No Cervical, Axillary, or Inguinal lymphadenopathy.  CBC Lab Results  Component Value Date   WBC 4.1 02/15/2021   HGB 14.3 02/15/2021   HCT 44.6 02/15/2021   MCV 92.9 02/15/2021   PLT 153 02/15/2021   BMET    Component Value Date/Time   NA 137 02/15/2021 1004   NA 144 11/18/2020 1011   K 4.6 02/15/2021 1004   CL 107 02/15/2021 1004   CO2 26 02/15/2021 1004   GLUCOSE 102 (H) 02/15/2021 1004   BUN 26 (H) 02/15/2021 1004   BUN 19 11/18/2020 1011   CREATININE 0.98 02/15/2021 1004   CREATININE 1.12 07/23/2015 1016   CALCIUM 8.9 02/15/2021 1004   GFRNONAA >60 02/15/2021 1004   GFRAA 85 04/22/2020 1013   Estimated Creatinine  Clearance: 69.3 mL/min (by C-G formula based on SCr of 0.98 mg/dL).  COAG Lab Results  Component Value Date   INR 0.9 02/15/2021   INR 1.2 10/07/2020   INR 1.0 09/30/2020   Radiology US Venous Img Lower Unilateral Left  Result Date: 02/15/2021 CLINICAL DATA:  Pain EXAM: LEFT LOWER EXTREMITY VENOUS DOPPLER ULTRASOUND TECHNIQUE: Gray-scale sonography with compression, as well as color and duplex ultrasound, were performed to evaluate the deep venous system(s) from the level of the common femoral vein through the popliteal and proximal calf veins. COMPARISON:  None. FINDINGS: VENOUS Normal compressibility of the common femoral, superficial femoral, and popliteal veins, as well as the visualized calf veins. Visualized portions of profunda femoral vein and great saphenous vein unremarkable. No filling defects to suggest DVT on grayscale or color Doppler imaging. Doppler waveforms show normal direction of venous flow, normal respiratory phasicity and response to augmentation. Limited views of the contralateral common femoral vein are unremarkable. OTHER Stent in the mid left SFA, with no internal flow signal identified. Limitations: none IMPRESSION: 1. Negative  for left lower extremity DVT. 2. Occluded mid left SFA stent. Electronically Signed   By: Lucrezia Europe M.D.   On: 02/15/2021 09:32    Assessment/Plan [name] is a 70 y.o. male with a past medical history of CHF, CAD status post multiple revascularization procedures of his left lower extremity presents to the emergency department for left foot discomfort and paleness.   1.  Left lower extremity ischemia: Patient has a known history of atherosclerotic disease requiring multiple endovascular interventions in the past.  Presents today with progressively worsening discomfort and paleness to the left foot.  States he was in his usual state of health and was "squatting" while working on something Saturday and when he stood up immediately felt  discomfort to his foot.  On exam the extremity is warm however transitions to become much cooler mid calf.  Foot is cold.  Unable to palpate pedal pulses.  Agree with initiation of heparin.  Recommend undergoing a left lower extremity angiogram with possible intervention to assess his anatomy and revascularize the extremity.  Procedure, risks and benefits were explained to the patient.  All questions were answered.  We will plan on this today with Dr. Lucky Cowboy.  2.  Hyperlipidemia: Patient is on aspirin for medical management He has had a documented allergy to statins Encouraged good control as its slows the progression of atherosclerotic disease   3.  Hypertension: On appropriate medications Encouraged good control as its slows the progression of atherosclerotic disease  Discussed with Dr. Mayme Genta, PA-C 02/15/2021 11:33 AM  This note was created with Dragon medical transcription system.  Any error is purely unintentional.

## 2021-02-15 NOTE — Progress Notes (Signed)
Dr. Lucky Cowboy at bedside, speaking with pt. Wife and pt. Re: procedural results. Both verbalize understanding of conversation.

## 2021-02-15 NOTE — Telephone Encounter (Signed)
See if he is still having pain today because this was 2 days ago.Marland KitchenMarland KitchenIf so, bring him in with ABIs

## 2021-02-15 NOTE — Consult Note (Signed)
ANTICOAGULATION CONSULT NOTE - Initial Consult  Pharmacy Consult for Heparin infusion Indication: chest pain/ACS  Allergies  Allergen Reactions   Brilinta [Ticagrelor] Shortness Of Breath   Chlorhexidine Gluconate Other (See Comments)    Skin burning for hours afterward   Contrast Media [Iodinated Diagnostic Agents] Itching    Face and head flushing, nose itching after contrast administation for angiogram   Statins Other (See Comments)    Failed Crestor 5 mg twice weekly, Crestor 20 mg daily, Pravastatin 40 mg qd, Lipitor, Zocor - muscle aches   Zetia [Ezetimibe] Other (See Comments)    Muscle aches    Patient Measurements: Height: 5\' 6"  (167.6 cm) Weight: 81.6 kg (180 lb) IBW/kg (Calculated) : 63.8 Heparin Dosing Weight: 80.3 kg  Vital Signs: Temp: 98.4 F (36.9 C) (12/19 0812) Temp Source: Oral (12/19 0812) BP: 94/73 (12/19 0812) Pulse Rate: 65 (12/19 0812)  Labs: No results for input(s): HGB, HCT, PLT, APTT, LABPROT, INR, HEPARINUNFRC, HEPRLOWMOCWT, CREATININE, CKTOTAL, CKMB, TROPONINIHS in the last 72 hours.  CrCl cannot be calculated (Patient's most recent lab result is older than the maximum 21 days allowed.).   Medical History: Past Medical History:  Diagnosis Date   Abnormal nuclear cardiac imaging test 08/08/2015   Arthritis    fingers   Carotid artery occlusion    CHF (congestive heart failure) (HCC)    Coronary atherosclerosis of native coronary artery 01/29/2013   11/05/19 R/LHC 80% dLMCA stenosis small diffusely dz dLAD, chronically occluded OM1 50%mRCA lesion, widely patent mLCx strent, moderately elevated L heart filling pressures, mild to moderate RH filling pressures, normal to moderately reduced CO   Duodenal erosion    Encounter for screening for lung cancer 07/13/2016   Esophageal stenosis    esophageal dilation   GERD (gastroesophageal reflux disease)    H. pylori infection    Heart attack ([place]) Oct. 2009   Mild   Hiatal hernia     Hyperlipidemia    Hypertension    Old myocardial infarction 11/29/2007   Mildly elevated troponin, isolated value in October 2009. Cardiac catheterization-nonobstructive 60% RCA disease-subsequent nuclear stress test-9 minutes, low risk, mild inferior wall hypokinesis    Pain in limb 12/19/2017   Peripheral vascular disease ([place])    Unstable angina ([place]) 11/25/2017    Medications:  (Not in a hospital admission)   Assessment: Patient admitted with low extremity pain after vascular procedure 2 days ago. Noted PMH relevant for HFrEF, post-op Afib on Eliquis (dc on Sept/2022), CAD s/p CABG, PAD and hyperlipidemia. Pharmacy was consulted to manage Heparin infusion for ACS.  Noted baseline aPTT, INR, and CBC already ordered by MD. Per patient and spouse his last dose of Eliquis was around November 1st.   Goal of Therapy:  Heparin level 0.3-0.7 units/ml aPTT 66-102 seconds Monitor platelets by anticoagulation protocol: Yes   Plan:  Give 4000 units bolus x 1 Start heparin infusion at 1000 units/hr Check anti-Xa level in 8 hours and daily while on heparin Continue to monitor H&H and platelets  Braiden Presutti Rodriguez-Guzman PharmD, BCPS 02/15/2021 10:16 AM

## 2021-02-15 NOTE — H&P (Signed)
History and Physical    ROBERTH BERLING KZS:[id number] DOB: [birth year] DOA: 02/15/2021  Referring MD/NP/PA:   PCP: Glean Hess, MD   Patient coming from:  The patient is coming from home.  At baseline, pt is independent for most of ADL.        Chief Complaint: left leg pain  HPI: LENELL LAMA is a 70 y.o. male with medical history significant of PAD (s/p of multiple revascularization procedures to left lower extremity), hypertension, hyperlipidemia, GERD, gout, CAD, CABG, CHF, carotid artery occlusion (s/p of carotid artery endarterectomy), esophageal stenosis, duodenal erosion, former smoker, alcohol use, left bundle blockage, steady myopathy, who presents with left leg pain.  Patient states that he started having left lower leg pain 3 days ago, which has been progressively worsening.  He also feels numbness in left lower leg.  No injury.  The pain is constant, sharp, moderate, nonradiating.  Pt states that he noted paleness in left lower leg and ankle. No fever or chills.  Patient does not have chest pain, cough, shortness breath.  No nausea vomiting, diarrhea or abdominal pain.  No symptoms of UTI.  Patient states that she drinks 3-4 ounces of bourbon every day.  Patient does not have recent fall or head injury.  No dark stool or rectal bleeding.  ED Course: pt was found to have WBC 4.1, INR 1.2, PTT 28, BNP 415, negative COVID PCR, GFR > 60, temperature normal, blood pressure 94/73, heart rate 65, RR 17, oxygen saturation 93% on room air.  Lower extremity venous Dopplers negative for DVT, but showed stent occlusion of left SFA.  Patient is admitted to [facility] bed as inpatient.  Dr. Delana Meyer of vascular surgery is consulted.   Review of Systems:   General: no fevers, chills, no body weight gain, has fatigue HEENT: no blurry vision, hearing changes or sore throat Respiratory: no dyspnea, coughing, wheezing CV: no chest pain, no palpitations GI: no nausea, vomiting, abdominal pain,  diarrhea, constipation GU: no dysuria, burning on urination, increased urinary frequency, hematuria  Ext: no leg edema. Has left lower leg pain Neuro: no unilateral weakness, numbness, or tingling, no vision change or hearing loss Skin: no rash, no skin tear. MSK: No muscle spasm, no deformity, no limitation of range of movement in spin Heme: No easy bruising.  Travel history: No recent [place] distant travel.  Allergy:  Allergies  Allergen Reactions   Brilinta [Ticagrelor] Shortness Of Breath   Chlorhexidine Gluconate Other (See Comments)    Skin burning for hours afterward   Contrast Media [Iodinated Diagnostic Agents] Itching    Face and head flushing, nose itching after contrast administation for angiogram   Statins Other (See Comments)    Failed Crestor 5 mg twice weekly, Crestor 20 mg daily, Pravastatin 40 mg qd, Lipitor, Zocor - muscle aches   Zetia [Ezetimibe] Other (See Comments)    Muscle aches    Past Medical History:  Diagnosis Date   Abnormal nuclear cardiac imaging test 08/08/2015   Arthritis    fingers   Carotid artery occlusion    CHF (congestive heart failure) (HCC)    Coronary atherosclerosis of native coronary artery 01/29/2013   11/05/19 R/LHC 80% dLMCA stenosis small diffusely dz dLAD, chronically occluded OM1 50%mRCA lesion, widely patent mLCx strent, moderately elevated L heart filling pressures, mild to moderate RH filling pressures, normal to moderately reduced CO   Duodenal erosion    Encounter for screening for lung cancer 07/13/2016   Esophageal stenosis  esophageal dilation   GERD (gastroesophageal reflux disease)    H. pylori infection    Heart attack ([place]) Oct. 2009   Mild   Hiatal hernia    Hyperlipidemia    Hypertension    Old myocardial infarction 11/29/2007   Mildly elevated troponin, isolated value in October 2009. Cardiac catheterization-nonobstructive 60% RCA disease-subsequent nuclear stress test-9 minutes, low risk, mild inferior wall  hypokinesis    Pain in limb 12/19/2017   Peripheral vascular disease ([place])    Unstable angina ([place]) 11/25/2017    Past Surgical History:  Procedure Laterality Date   APPENDECTOMY     BACK SURGERY     BIV ICD INSERTION CRT-D N/A 03/23/2020   Procedure: BIV ICD INSERTION CRT-D;  Surgeon: Vickie Epley, MD;  Location: [facility];  Service: Cardiovascular;  Laterality: N/A;   CARDIAC CATHETERIZATION N/A 08/07/2015   Procedure: Left Heart Cath and Coronary Angiography;  Surgeon: [name], MD;  Location: [facility];  Service: Cardiovascular;  Laterality: N/A;   CARDIAC CATHETERIZATION N/A 08/07/2015   Procedure: Coronary Stent Intervention;  Surgeon: [name], MD;  Location: [facility];  Service: Cardiovascular;  Laterality: N/A;   CARDIAC CATHETERIZATION N/A 08/07/2015   Procedure: Coronary Stent Intervention;  Surgeon: [name], MD;  Location: [facility];  Service: Cardiovascular;  Laterality: N/A;   CAROTID ENDARTERECTOMY  01/05/2006   Right  CEA with DPA   CATARACT EXTRACTION W/ INTRAOCULAR LENS IMPLANT Left 12/04/2017   CATARACT EXTRACTION W/PHACO Left 12/04/2017   Procedure: CATARACT EXTRACTION PHACO AND INTRAOCULAR LENS PLACEMENT ([place]) LEFT;  Surgeon: Eulogio Bear, MD;  Location: [facility];  Service: Ophthalmology;  Laterality: Left;   CATARACT EXTRACTION W/PHACO Right 02/06/2018   Procedure: CATARACT EXTRACTION PHACO AND INTRAOCULAR LENS PLACEMENT (I[facility])RIGHT;  Surgeon: Eulogio Bear, MD;  Location: [facility];  Service: Ophthalmology;  Laterality: Right;   COLONOSCOPY  05/20/2008   COLONOSCOPY WITH PROPOFOL N/A 09/13/2018   Procedure: COLONOSCOPY WITH BIOPSY;  Surgeon: [name], MD;  Location: [facility];  Service: Endoscopy;  Laterality: N/A;   CORONARY ARTERY BYPASS GRAFT N/A 11/11/2019   Procedure: CORONARY ARTERY BYPASS GRAFTING (CABG) USING LIMA to Diag1; ENDOSCOPICALLY HARVESTED RIGHT GREATER  SAPHENOUS VEIN: SVG to OM1; SVG to OM2; SVG to PDA.;  Surgeon: Ivin Poot, MD;  Location: [facility];  Service: Open Heart Surgery;  Laterality: N/A;   CORONARY STENT PLACEMENT  08/07/2015   MID CIRCUMFLEX   ENDARTERECTOMY FEMORAL Bilateral 09/30/2020   Procedure: ENDARTERECTOMY FEMORAL;  Surgeon: Algernon Huxley, MD;  Location: [facility];  Service: Vascular;  Laterality: Bilateral;   ENDOVEIN HARVEST OF GREATER SAPHENOUS VEIN Right 11/11/2019   Procedure: ENDOVEIN HARVEST OF GREATER SAPHENOUS VEIN;  Surgeon: Ivin Poot, MD;  Location: [facility];  Service: Open Heart Surgery;  Laterality: Right;   ESOPHAGOGASTRODUODENOSCOPY (EGD) WITH PROPOFOL N/A 02/11/2019   Procedure: ESOPHAGOGASTRODUODENOSCOPY (EGD) WITH BIOPSY and  Dilation;  Surgeon: [name], MD;  Location: [place] [facility];  Service: Endoscopy;  Laterality: N/A;   HIP SURGERY Left 10/2016   left hip tendon repair   LEFT HEART CATH AND CORONARY ANGIOGRAPHY N/A 11/27/2017   Procedure: LEFT HEART CATH AND CORONARY ANGIOGRAPHY;  Surgeon: Wellington Hampshire, MD;  Location: [facility];  Service: Cardiovascular;  Laterality: N/A;   LOWER EXTREMITY ANGIOGRAPHY Left 02/12/2018   Procedure: LOWER EXTREMITY ANGIOGRAPHY;  Surgeon: Algernon Huxley, MD;  Location: [facility];  Service: Cardiovascular;  Laterality: Left;   LOWER EXTREMITY ANGIOGRAPHY Left 03/07/2018   Procedure: LOWER EXTREMITY ANGIOGRAPHY;  Surgeon: Algernon Huxley, MD;  Location: [facility];  Service: Cardiovascular;  Laterality: Left;   LOWER EXTREMITY ANGIOGRAPHY Left 06/04/2018   Procedure: LOWER EXTREMITY ANGIOGRAPHY;  Surgeon: Algernon Huxley, MD;  Location: [facility];  Service: Cardiovascular;  Laterality: Left;   LOWER EXTREMITY ANGIOGRAPHY Left 09/17/2020   Procedure: LOWER EXTREMITY ANGIOGRAPHY;  Surgeon: Algernon Huxley, MD;  Location: [facility];  Service: Cardiovascular;  Laterality: Left;   LOWER EXTREMITY ANGIOGRAPHY Left 09/28/2020    Procedure: LOWER EXTREMITY ANGIOGRAPHY;  Surgeon: Algernon Huxley, MD;  Location: [facility];  Service: Cardiovascular;  Laterality: Left;   PLACEMENT OF IMPELLA LEFT VENTRICULAR ASSIST DEVICE N/A 11/11/2019   Procedure: PLACEMENT OF IMPELLA LEFT VENTRICULAR ASSIST DEVICE 5.5;  Surgeon: Ivin Poot, MD;  Location: [facility];  Service: Open Heart Surgery;  Laterality: N/A;  Midline Sternotomy   POLYPECTOMY N/A 09/13/2018   Procedure: POLYPECTOMY;  Surgeon: [name], MD;  Location: [facility];  Service: Endoscopy;  Laterality: N/A;   POLYPECTOMY N/A 02/11/2019   Procedure: POLYPECTOMY;  Surgeon: [name], MD;  Location: [place];  Service: Endoscopy;  Laterality: N/A;   REMOVAL OF IMPELLA LEFT VENTRICULAR ASSIST DEVICE N/A 11/15/2019   Procedure: REMOVAL OF IMPELLA 5.5 LEFT VENTRICULAR ASSIST DEVICE;  Surgeon: Ivin Poot, MD;  Location: [facility];  Service: Open Heart Surgery;  Laterality: N/A;   RIGHT/LEFT HEART CATH AND CORONARY ANGIOGRAPHY N/A 11/05/2019   Procedure: RIGHT/LEFT HEART CATH AND CORONARY ANGIOGRAPHY;  Surgeon: Nelva Bush, MD;  Location: [facility];  Service: Cardiovascular;  Laterality: N/A;   SPINE SURGERY     TEE WITHOUT CARDIOVERSION N/A 11/11/2019   Procedure: TRANSESOPHAGEAL ECHOCARDIOGRAM (TEE);  Surgeon: Prescott Gum, Collier Salina, MD;  Location: [place];  Service: Open Heart Surgery;  Laterality: N/A;   TEE WITHOUT CARDIOVERSION N/A 11/15/2019   Procedure: TRANSESOPHAGEAL ECHOCARDIOGRAM (TEE);  Surgeon: Prescott Gum, Collier Salina, MD;  Location: [facility];  Service: Open Heart Surgery;  Laterality: N/A;   TONSILLECTOMY      Social History:  reports that he quit smoking about 15 years ago. His smoking use included cigarettes. He has a 43.75 pack-year smoking history. His smokeless tobacco use includes snuff. He reports current alcohol use of about 8.0 - 10.0 standard drinks per week. He reports that he does not use drugs.  Family History:  Family  History  Problem Relation Age of Onset   Heart attack Mother    Coronary artery disease Mother    Heart disease Mother        Carotid Stenosis and BPG and Heart Disease before age 23   Diabetes Mother    Hypertension Mother    Heart attack Father    Heart disease Father        BPG and Heart Disease before age 57   Hypertension Father    Cancer Father 35       throat   Stroke Father    Colon cancer Neg Hx    Colon polyps Neg Hx    Esophageal cancer Neg Hx    Rectal cancer Neg Hx    Stomach cancer Neg Hx      Prior to Admission medications   Medication Sig Start Date End Date Taking? Authorizing Provider  Alirocumab (PRALUENT) 150 MG/ML SOAJ Inject 1 pen into the  skin every 14 (fourteen) days. 09/01/20   Marrianne Mood D, PA-C  aspirin EC 81 MG tablet Take 1 tablet (81 mg total) by mouth daily. 09/17/20   Algernon Huxley, MD  carvedilol (COREG) 12.5 MG tablet Take 1 tablet (12.5 mg total) by mouth 2 (two) times daily. 05/20/20 05/15/21  End, Harrell Gave, MD  colchicine 0.6 MG tablet Take 1 tablet (0.6 mg total) by mouth 2 (two) times daily. 10/14/20   Glean Hess, MD  gabapentin (NEURONTIN) 300 MG capsule Take 1 capsule (300 mg total) by mouth at bedtime. 10/16/20   Kris Hartmann, NP  nitroGLYCERIN (NITROSTAT) 0.4 MG SL tablet Place 1 tablet (0.4 mg total) under the tongue every 5 (five) minutes as needed for chest pain. 08/08/15   Erlene Quan, PA-C  oxyCODONE (OXY IR/ROXICODONE) 5 MG immediate release tablet Take 1-2 tablets (5-10 mg total) by mouth every 6 (six) hours as needed for severe pain or moderate pain. 10/02/20   Stegmayer, Janalyn Harder, PA-C  sacubitril-valsartan (ENTRESTO) 49-51 MG Take 1 tablet by mouth 2 (two) times daily. Need to make an appointment for further refills 01/13/21 01/08/22  End, Harrell Gave, MD  silver sulfADIAZINE (SILVADENE) 1 % cream Apply 1 application topically daily. 01/11/21   Kris Hartmann, NP  traZODone (DESYREL) 50 MG tablet TAKE 1 TABLET BY  MOUTH AT BEDTIME AS NEEDED FOR SLEEP. 01/30/20   Ivin Poot, MD    Physical Exam: Vitals:   02/15/21 1730 02/15/21 1745 02/15/21 1800 02/15/21 1831  BP: (!) 147/65  (!) 148/78 139/81  Pulse: 75 90 80 64  Resp: 19 20 19 16   Temp:    97.9 F (36.6 C)  TempSrc:      SpO2: 93% 94% 98% 95%  Weight:      Height:       General: Not in acute distress HEENT:       Eyes: PERRL, EOMI, no scleral icterus.       ENT: No discharge from the ears and nose, no pharynx injection, no tonsillar enlargement.        Neck: No JVD, no bruit, no mass felt. Heme: No neck lymph node enlargement. Cardiac: S1/S2, RRR, No murmurs, No gallops or rubs. Respiratory: No rales, wheezing, rhonchi or rubs. GI: Soft, nondistended, nontender, no rebound pain, no organomegaly, BS present. GU: No hematuria Ext: No pitting leg edema bilaterally. 1+DP/PT pulse in right leg.  Difficult to palpate DP/PT pulse in left leg, but left leg and foot are warm tough Musculoskeletal: No joint deformities, No joint redness or warmth, no limitation of ROM in spin. Skin: No rashes.  Neuro: Alert, oriented X3, cranial nerves II-XII grossly intact, moves all extremities normally.  Psych: Patient is not psychotic, no suicidal or hemocidal ideation.  Labs on Admission: I have personally reviewed following labs and imaging studies  CBC: Recent Labs  Lab 02/15/21 1004  WBC 4.1  HGB 14.3  HCT 44.6  MCV 92.9  PLT 324   Basic Metabolic Panel: Recent Labs  Lab 02/15/21 1004  NA 137  K 4.6  CL 107  CO2 26  GLUCOSE 102*  BUN 26*  CREATININE 0.98  CALCIUM 8.9   GFR: Estimated Creatinine Clearance: 69.3 mL/min (by C-G formula based on SCr of 0.98 mg/dL). Liver Function Tests: Recent Labs  Lab 02/15/21 1004  AST 23  ALT 25  ALKPHOS 80  BILITOT 0.9  PROT 7.4  ALBUMIN 4.2   No results for input(s): LIPASE, AMYLASE in  the last 168 hours. No results for input(s): AMMONIA in the last 168 hours. Coagulation  Profile: Recent Labs  Lab 02/15/21 1004  INR 0.9   Cardiac Enzymes: No results for input(s): CKTOTAL, CKMB, CKMBINDEX, TROPONINI in the last 168 hours. BNP (last 3 results) No results for input(s): PROBNP in the last 8760 hours. HbA1C: No results for input(s): HGBA1C in the last 72 hours. CBG: No results for input(s): GLUCAP in the last 168 hours. Lipid Profile: No results for input(s): CHOL, HDL, LDLCALC, TRIG, CHOLHDL, LDLDIRECT in the last 72 hours. Thyroid Function Tests: No results for input(s): TSH, T4TOTAL, FREET4, T3FREE, THYROIDAB in the last 72 hours. Anemia Panel: No results for input(s): VITAMINB12, FOLATE, FERRITIN, TIBC, IRON, RETICCTPCT in the last 72 hours. Urine analysis:    Component Value Date/Time   COLORURINE YELLOW (A) 10/07/2020 1150   APPEARANCEUR CLEAR (A) 10/07/2020 1150   LABSPEC 1.017 10/07/2020 1150   PHURINE 6.0 10/07/2020 1150   GLUCOSEU NEGATIVE 10/07/2020 1150   HGBUR NEGATIVE 10/07/2020 1150   BILIRUBINUR NEGATIVE 10/07/2020 1150   BILIRUBINUR neg 05/21/2019 0823   KETONESUR NEGATIVE 10/07/2020 1150   PROTEINUR NEGATIVE 10/07/2020 1150   UROBILINOGEN 0.2 05/21/2019 0823   UROBILINOGEN 1.0 12/12/2011 0914   NITRITE NEGATIVE 10/07/2020 1150   LEUKOCYTESUR NEGATIVE 10/07/2020 1150   Sepsis Labs: @LABRCNTIP (procalcitonin:4,lacticidven:4) ) Recent Results (from the past 240 hour(s))  Resp Panel by RT-PCR (Flu A&B, Covid) Nasopharyngeal Swab     Status: None   Collection Time: 02/15/21 10:04 AM   Specimen: Nasopharyngeal Swab; Nasopharyngeal(NP) swabs in vial transport medium  Result Value Ref Range Status   SARS Coronavirus 2 by RT PCR NEGATIVE NEGATIVE Final    Comment: (NOTE) SARS-CoV-2 target nucleic acids are NOT DETECTED.  The SARS-CoV-2 RNA is generally detectable in upper respiratory specimens during the acute phase of infection. The lowest concentration of SARS-CoV-2 viral copies this assay can detect is 138 copies/mL. A  negative result does not preclude SARS-Cov-2 infection and should not be used as the sole basis for treatment or other patient management decisions. A negative result may occur with  improper specimen collection/handling, submission of specimen other than nasopharyngeal swab, presence of viral mutation(s) within the areas targeted by this assay, and inadequate number of viral copies(<138 copies/mL). A negative result must be combined with clinical observations, patient history, and epidemiological information. The expected result is Negative.  Fact Sheet for Patients:  EntrepreneurPulse.com.au  Fact Sheet for Healthcare Providers:  IncredibleEmployment.be  This test is no t yet approved or cleared by the [place] FDA and  has been authorized for detection and/or diagnosis of SARS-CoV-2 by FDA under an Emergency Use Authorization (EUA). This EUA will remain  in effect (meaning this test can be used) for the duration of the COVID-19 declaration under Section 564(b)(1) of the Act, 21 U.S.C.section 360bbb-3(b)(1), unless the authorization is terminated  or revoked sooner.       Influenza A by PCR NEGATIVE NEGATIVE Final   Influenza B by PCR NEGATIVE NEGATIVE Final    Comment: (NOTE) The Xpert Xpress SARS-CoV-2/FLU/RSV plus assay is intended as an aid in the diagnosis of influenza from Nasopharyngeal swab specimens and should not be used as a sole basis for treatment. Nasal washings and aspirates are unacceptable for Xpert Xpress SARS-CoV-2/FLU/RSV testing.  Fact Sheet for Patients: EntrepreneurPulse.com.au  Fact Sheet for Healthcare Providers: IncredibleEmployment.be  This test is not yet approved or cleared by the [place] FDA and has been authorized for detection and/or  diagnosis of SARS-CoV-2 by FDA under an Emergency Use Authorization (EUA). This EUA will remain in effect (meaning this test can  be used) for the duration of the COVID-19 declaration under Section 564(b)(1) of the Act, 21 U.S.C. section 360bbb-3(b)(1), unless the authorization is terminated or revoked.  Performed at [facility], [address] [place], [place] [address]      Radiological Exams on Admission: PERIPHERAL VASCULAR CATHETERIZATION  Result Date: 02/15/2021 See surgical note for result.  US Venous Img Lower Unilateral Left  Result Date: 02/15/2021 CLINICAL DATA:  Pain EXAM: LEFT LOWER EXTREMITY VENOUS DOPPLER ULTRASOUND TECHNIQUE: Gray-scale sonography with compression, as well as color and duplex ultrasound, were performed to evaluate the deep venous system(s) from the level of the common femoral vein through the popliteal and proximal calf veins. COMPARISON:  None. FINDINGS: VENOUS Normal compressibility of the common femoral, superficial femoral, and popliteal veins, as well as the visualized calf veins. Visualized portions of profunda femoral vein and great saphenous vein unremarkable. No filling defects to suggest DVT on grayscale or color Doppler imaging. Doppler waveforms show normal direction of venous flow, normal respiratory phasicity and response to augmentation. Limited views of the contralateral common femoral vein are unremarkable. OTHER Stent in the mid left SFA, with no internal flow signal identified. Limitations: none IMPRESSION: 1. Negative for left lower extremity DVT. 2. Occluded mid left SFA stent. Electronically Signed   By: Lucrezia Europe M.D.   On: 02/15/2021 09:32     EKG: Not done in ED, will get one.   Assessment/Plan Principal Problem:   Ischemia of left lower extremity Active Problems:   Essential hypertension   PAD (peripheral artery disease) (HCC)   HLD (hyperlipidemia)   CAD (coronary artery disease)   Chronic diastolic CHF (congestive heart failure) (HCC)   Alcohol use   Ischemia of left lower extremity and hx of PVD: Patient has history of PVD, now has  occlusion of the left SFA stent.  Dr. Delana Meyer of vascular surgery was consulted, recommended left lower extremity angiogram with possible intervention.  -Admitted to [facility] bed as inpatient -IV heparin -As needed Percocet, morphine, Tylenol for pain -ASA  Essential hypertension -IV hydralazine as needed -Coreg, Entresto,  HLD (hyperlipidemia) -Patient is getting Praluent injection every 2 weeks  CAD (coronary artery disease): S/p of CABG.  No chest pain -Continue aspirin and Coreg -As needed nitroglycerin  Chronic diastolic CHF (congestive heart failure) ([place]): 2D echo on 08/10/2020 showed EF of 50-55%.  Patient does not have leg edema or JVD.  CHF seem to be compensated. -Patient is on Entresto -Check BNP    DVT ppx: On IV Heparin     Code Status: Full code Family Communication: Yes, patient's wife at bed side.    Disposition Plan:  Anticipate discharge back to previous environment Consults called:  Dr. Delana Meyer of vascular surgery Admission status and Level of care: Progressive Cardiac:   as inpt    Status is: Inpatient  Remains inpatient appropriate because: Patient has multiple comorbidities, including history of PAD, now presents with left leg ischemia.  Patient will need revascularization procedure.  His presentation is highly complicated.  Patient will need to be treated in hospital for at least 2 days.        Date of Service 02/15/2021    Ivor Costa [facility]   If 7PM-7AM, please contact night-coverage [email or web address] 02/15/2021, 6:52 PM

## 2021-02-15 NOTE — ED Triage Notes (Signed)
Pt c/o left leg pain behind the knee with calf and foot numbness since Friday. "I think my stent there is collapsed"

## 2021-02-15 NOTE — ED Notes (Signed)
Vascular PA at bedside  

## 2021-02-15 NOTE — Op Note (Signed)
[facility]  Percutaneous Study/Intervention Procedural Note   Date of Surgery: 02/15/2021  Surgeon(s):Teyah Rossy    Assistants:none  Pre-operative Diagnosis: PAD with rest Green, acute on chronic ischemia left leg  Post-operative diagnosis:  Same  Procedure(s) Performed:             1.  Ultrasound guidance for vascular access right femoral artery             2.  Catheter placement into left common femoral artery from right femoral approach             3.  Aortogram and selective left lower extremity angiogram             4.  Percutaneous transluminal angioplasty of left profunda femoris artery with 5 mm diameter by 4 cm length Lutonix drug-coated angioplasty balloon             5.  Mechanical thrombectomy to the left SFA, popliteal artery, tibioperoneal trunk, and posterior tibial arteries using the penumbra CAT 7 device  6.  Percutaneous transluminal angioplasty of the left posterior tibial artery with 2.5 mm diameter angioplasty balloon  7.  Stent placement to the left SFA with 6 mm diameter by 7.5 cm length Viabahn stent             8.  StarClose closure device right femoral artery  EBL: 150 cc  Contrast: 55 cc  Fluoro Time: 9.1 minutes  Moderate Conscious Sedation Time: approximately 46 minutes using 4 mg of Versed and 100 mcg of Fentanyl              Indications:  Patient is a 70 y.o.male with recurrent acute on chronic ischemia of the left lower extremity after multiple previous surgeries and interventions. The patient has rest Green.  The patient is brought in for angiography for further evaluation and potential treatment.  Due to the limb threatening nature of the situation, angiogram was performed for attempted limb salvage. The patient is aware that if the procedure fails, amputation would be expected.  The patient also understands that even with successful revascularization, amputation may still be required due to the severity of the situation.  Risks  and benefits are discussed and informed consent is obtained.   Procedure:  The patient was identified and appropriate procedural time out was performed.  The patient was then placed supine on the table and prepped and draped in the usual sterile fashion. Moderate conscious sedation was administered during a face to face encounter with the patient throughout the procedure with my supervision of the RN administering medicines and monitoring the patient's vital signs, pulse oximetry, telemetry and mental status throughout from the start of the procedure until the patient was taken to the recovery room. Ultrasound was used to evaluate the right common femoral artery.  It was patent .  A digital ultrasound image was acquired.  A Seldinger needle was used to access the right common femoral artery under direct ultrasound guidance and a permanent image was performed.  A 0.035 J wire was advanced without resistance and a 5Fr sheath was placed.  Pigtail catheter was placed into the aorta and an AP aortogram was performed. This demonstrated normal renal arteries and normal aorta and iliac segments without significant stenosis. I then crossed the aortic bifurcation and advanced to the left femoral head. Selective left lower extremity angiogram was then performed. This demonstrated a normal common femoral artery.  The profunda femoris artery had a high-grade stenosis in  the 80 to 90% range just beyond the previous endarterectomy site likely due to intimal hyperplasia and residual disease distal to the endarterectomy.  The proximal to mid SFA were fairly normal with occlusion a few centimeters above the previously placed stents.  There was reconstitution of all 3 tibial vessels beyond proximal occlusions.  The stent went to just above the origin of the anterior tibial artery and the distal popliteal artery. It was felt that it was in the patient's best interest to proceed with intervention after these images to avoid a second  procedure and a larger amount of contrast and fluoroscopy based off of the findings from the initial angiogram. The patient was systemically heparinized and a 7 [place] [employer] sheath was then placed over the [employer] wire. I then used a Kumpe catheter and the advantage wire to navigate across the profunda femoris stenosis which was addressed first.  A 5 mm diameter by 4 cm length Lutonix drug-coated angioplasty balloon was inflated to 12 atm for 1 minute.  There still remained a borderline 50% residual stenosis, but I did not want to inflate much larger than this in the profunda with no bailout option for stenting.  I then turned my attention to the thrombosed SFA and popliteal stents as well as making sure he restored tibial flow.  8 mg tPA were instilled in the left SFA and popliteal arteries through the Kumpe catheter and then across the occlusion without difficulty with the advantage wire and the Kumpe catheter confirming intraluminal flow in the mid posterior tibial artery.  I then exchanged for a V 18 wire.  Mechanical thrombectomy was then performed with the penumbra CAT 7 device in the left SFA, popliteal arteries, tibioperoneal trunk, and posterior tibial arteries.  3 passes were made with the penumbra CAT 7 device mechanical thrombectomy created significant improvement although there was residual thrombus creating greater than 50% stenosis in the proximal portion of the previously placed stents, and the posterior tibial artery was occluded in the mid and distal segments.  The anterior tibial and peroneal arteries now had flow.  I then turned my attention to the posterior tibial artery and treated this with 2 inflations with a 2.5 mm diameter by 22 cm length angioplasty balloon inflated all the way down into the foot and going up to the proximal portion.  These inflations were 6 to 8 atm for a minute.  Completion imaging showed some spasm distally, but did not appear to be any greater than 50%  residual stenosis and that there were 3 vessels distally.  A Viabahn covered stent was then placed in the SFA to cover the thrombus in the proximal portion of the previously placed stent.  A 6 mm diameter by 7.5 cm length Viabahn stent was deployed.  This open well was not postdilated and there was less than 10% residual stenosis.  There is now brisk flow distally. I elected to terminate the procedure. The sheath was removed and StarClose closure device was deployed in the right femoral artery with excellent hemostatic result. The patient was taken to the recovery room in stable condition having tolerated the procedure well.  Findings:               Aortogram: Normal renal arteries, normal aorta and iliac arteries including the previous stent placement in the right iliac system without stenosis.             Left lower Extremity: Normal common femoral artery.  The profunda femoris artery  had a high-grade stenosis in the 80 to 90% range just beyond the previous endarterectomy site likely due to intimal hyperplasia and residual disease distal to the endarterectomy.  The proximal to mid SFA were fairly normal with occlusion a few centimeters above the previously placed stents.  There was reconstitution of all 3 tibial vessels beyond proximal occlusions.  The stent went to just above the origin of the anterior tibial artery and the distal popliteal artery.   Disposition: Patient was taken to the recovery room in stable condition having tolerated the procedure well.  Complications: None  [name] 02/15/2021 4:13 PM   This note was created with Dragon Medical transcription system. Any errors in dictation are purely unintentional.

## 2021-02-15 NOTE — Progress Notes (Signed)
Noted constant, slow ooze under PAD. PAD removed. Manual pressure held x 5 min. M. Hankins, tech. Injected epi/lido SQ into RFA site. Benzoin applied to skin, then new sterile PAD to site. Pt. Tolerated well.

## 2021-02-15 NOTE — ED Provider Notes (Addendum)
[facility] Emergency Department Provider Note  Time seen: 9:46 AM  I have reviewed the triage vital signs and the nursing notes.   HISTORY  Chief Complaint Leg Pain   HPI [name] is a 70 y.o. male with a past medical history of CHF, CAD status post multiple revascularization procedures of his left lower extremity presents to the emergency department for left foot discomfort and paleness.  According to the patient 2 days ago on Saturday he crouched down and shortly afterwards noted some pain to the left foot.  States he has had intermittent pain in paleness and appearance of the left foot ever since.  Patient states pain this morning so he came to the emergency department for evaluation.   Past Medical History:  Diagnosis Date   Abnormal nuclear cardiac imaging test 08/08/2015   Arthritis    fingers   Carotid artery occlusion    CHF (congestive heart failure) (HCC)    Coronary atherosclerosis of native coronary artery 01/29/2013   11/05/19 R/LHC 80% dLMCA stenosis small diffusely dz dLAD, chronically occluded OM1 50%mRCA lesion, widely patent mLCx strent, moderately elevated L heart filling pressures, mild to moderate RH filling pressures, normal to moderately reduced CO   Duodenal erosion    Encounter for screening for lung cancer 07/13/2016   Esophageal stenosis    esophageal dilation   GERD (gastroesophageal reflux disease)    H. pylori infection    Heart attack ([place]) Oct. 2009   Mild   Hiatal hernia    Hyperlipidemia    Hypertension    Old myocardial infarction 11/29/2007   Mildly elevated troponin, isolated value in October 2009. Cardiac catheterization-nonobstructive 60% RCA disease-subsequent nuclear stress test-9 minutes, low risk, mild inferior wall hypokinesis    Pain in limb 12/19/2017   Peripheral vascular disease ([place])    Unstable angina ([place]) 11/25/2017    Patient Active Problem List   Diagnosis Date Noted   AKI (acute kidney injury)  ([place]) 11/20/2020   Gouty arthritis of great toe 10/14/2020   Ischemic leg 09/28/2020   Postoperative atrial fibrillation ([place]) 05/21/2020   Statin myopathy 05/05/2020   Acquired thrombophilia ([place]) 01/08/2020   Post-operative state 12/18/2019   Postop check 11/27/2019   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   Accelerating angina ([place]) 11/05/2019   Left bundle branch block 11/05/2019   Shortness of breath    Stricture and stenosis of esophagus    Polyp of colon    Rectal polyp    CAD, multiple vessel 08/13/2018   PAD (peripheral artery disease) ([place]) 08/13/2018   Carotid stenosis 07/17/2018   Atherosclerosis of artery of extremity with rest pain ([place]) 00/92/3300   Chronic systolic heart failure ([place]) 03/12/2018   Nonischemic cardiomyopathy ([place]) 12/08/2017   Myalgia due to statin 12/08/2017   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 ([place]) 05/01/2017   Foot pain, bilateral 05/01/2017   Degenerative disc disease, 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   History of tobacco use 07/13/2016   Overweight (BMI 25.0-29.9) 07/13/2016   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   Dysphagia 11/26/2015   Chronic anticoagulation: plavix 10/15/2015   Chronic left hip pain 09/15/2015   Coronary artery disease involving native coronary artery of native heart without angina pectoris 08/08/2015   Essential hypertension 08/08/2015   Mixed hyperlipidemia 01/29/2013   History of CEA (carotid endarterectomy) 11/08/2011    Past Surgical History:  Procedure Laterality Date   APPENDECTOMY     BACK SURGERY     BIV ICD  INSERTION CRT-D N/A 03/23/2020   Procedure: BIV ICD INSERTION CRT-D;  Surgeon: Vickie Epley, MD;  Location: [facility];  Service: Cardiovascular;  Laterality: N/A;   CARDIAC CATHETERIZATION N/A 08/07/2015   Procedure: Left Heart Cath and Coronary Angiography;  Surgeon: [name], MD;  Location: [facility] [facility];  Service: Cardiovascular;  Laterality: N/A;   CARDIAC CATHETERIZATION N/A  08/07/2015   Procedure: Coronary Stent Intervention;  Surgeon: [name], MD;  Location: [place] [facility];  Service: Cardiovascular;  Laterality: N/A;   CARDIAC CATHETERIZATION N/A 08/07/2015   Procedure: Coronary Stent Intervention;  Surgeon: [name], MD;  Location: [place] [facility];  Service: Cardiovascular;  Laterality: N/A;   CAROTID ENDARTERECTOMY  01/05/2006   Right  CEA with DPA   CATARACT EXTRACTION W/ INTRAOCULAR LENS IMPLANT Left 12/04/2017   CATARACT EXTRACTION W/PHACO Left 12/04/2017   Procedure: CATARACT EXTRACTION PHACO AND INTRAOCULAR LENS PLACEMENT ([place]) LEFT;  Surgeon: Eulogio Bear, MD;  Location: [place];  Service: Ophthalmology;  Laterality: Left;   CATARACT EXTRACTION W/PHACO Right 02/06/2018   Procedure: CATARACT EXTRACTION PHACO AND INTRAOCULAR LENS PLACEMENT (IOC)RIGHT;  Surgeon: Eulogio Bear, MD;  Location: [place];  Service: Ophthalmology;  Laterality: Right;   COLONOSCOPY  05/20/2008   COLONOSCOPY WITH PROPOFOL N/A 09/13/2018   Procedure: COLONOSCOPY WITH BIOPSY;  Surgeon: [name], MD;  Location: [place];  Service: Endoscopy;  Laterality: N/A;   CORONARY ARTERY BYPASS GRAFT N/A 11/11/2019   Procedure: CORONARY ARTERY BYPASS GRAFTING (CABG) USING LIMA to Diag1; ENDOSCOPICALLY HARVESTED RIGHT GREATER SAPHENOUS VEIN: SVG to OM1; SVG to OM2; SVG to PDA.;  Surgeon: Ivin Poot, MD;  Location: [place];  Service: Open Heart Surgery;  Laterality: N/A;   CORONARY STENT PLACEMENT  08/07/2015   MID CIRCUMFLEX   ENDARTERECTOMY FEMORAL Bilateral 09/30/2020   Procedure: ENDARTERECTOMY FEMORAL;  Surgeon: Algernon Huxley, MD;  Location: [facility];  Service: Vascular;  Laterality: Bilateral;   ENDOVEIN HARVEST OF GREATER SAPHENOUS VEIN Right 11/11/2019   Procedure: ENDOVEIN HARVEST OF GREATER SAPHENOUS VEIN;  Surgeon: Ivin Poot, MD;  Location: [place];  Service: Open Heart Surgery;  Laterality: Right;    ESOPHAGOGASTRODUODENOSCOPY (EGD) WITH PROPOFOL N/A 02/11/2019   Procedure: ESOPHAGOGASTRODUODENOSCOPY (EGD) WITH BIOPSY and  Dilation;  Surgeon: [name], MD;  Location: [place];  Service: Endoscopy;  Laterality: N/A;   HIP SURGERY Left 10/2016   left hip tendon repair   LEFT HEART CATH AND CORONARY ANGIOGRAPHY N/A 11/27/2017   Procedure: LEFT HEART CATH AND CORONARY ANGIOGRAPHY;  Surgeon: Wellington Hampshire, MD;  Location: [place] [facility];  Service: Cardiovascular;  Laterality: N/A;   LOWER EXTREMITY ANGIOGRAPHY Left 02/12/2018   Procedure: LOWER EXTREMITY ANGIOGRAPHY;  Surgeon: Algernon Huxley, MD;  Location: [place] [facility];  Service: Cardiovascular;  Laterality: Left;   LOWER EXTREMITY ANGIOGRAPHY Left 03/07/2018   Procedure: LOWER EXTREMITY ANGIOGRAPHY;  Surgeon: Algernon Huxley, MD;  Location: [facility] [facility];  Service: Cardiovascular;  Laterality: Left;   LOWER EXTREMITY ANGIOGRAPHY Left 06/04/2018   Procedure: LOWER EXTREMITY ANGIOGRAPHY;  Surgeon: Algernon Huxley, MD;  Location: [place] [facility];  Service: Cardiovascular;  Laterality: Left;   LOWER EXTREMITY ANGIOGRAPHY Left 09/17/2020   Procedure: LOWER EXTREMITY ANGIOGRAPHY;  Surgeon: Algernon Huxley, MD;  Location: [place] [facility];  Service: Cardiovascular;  Laterality: Left;   LOWER EXTREMITY ANGIOGRAPHY Left 09/28/2020   Procedure: LOWER EXTREMITY ANGIOGRAPHY;  Surgeon: Algernon Huxley, MD;  Location: [place] [facility];  Service: Cardiovascular;  Laterality: Left;   PLACEMENT OF IMPELLA LEFT VENTRICULAR ASSIST DEVICE N/A 11/11/2019   Procedure: PLACEMENT OF IMPELLA LEFT VENTRICULAR ASSIST DEVICE 5.5;  Surgeon: Ivin Poot, MD;  Location: [place];  Service: Open Heart Surgery;  Laterality: N/A;  Midline Sternotomy   POLYPECTOMY N/A 09/13/2018   Procedure: POLYPECTOMY;  Surgeon: [name], MD;  Location: [place];  Service: Endoscopy;  Laterality: N/A;   POLYPECTOMY N/A 02/11/2019   Procedure:  POLYPECTOMY;  Surgeon: [name], MD;  Location: [place];  Service: Endoscopy;  Laterality: N/A;   REMOVAL OF IMPELLA LEFT VENTRICULAR ASSIST DEVICE N/A 11/15/2019   Procedure: REMOVAL OF IMPELLA 5.5 LEFT VENTRICULAR ASSIST DEVICE;  Surgeon: Ivin Poot, MD;  Location: [place];  Service: Open Heart Surgery;  Laterality: N/A;   RIGHT/LEFT HEART CATH AND CORONARY ANGIOGRAPHY N/A 11/05/2019   Procedure: RIGHT/LEFT HEART CATH AND CORONARY ANGIOGRAPHY;  Surgeon: Nelva Bush, MD;  Location: [place] [facility];  Service: Cardiovascular;  Laterality: N/A;   SPINE SURGERY     TEE WITHOUT CARDIOVERSION N/A 11/11/2019   Procedure: TRANSESOPHAGEAL ECHOCARDIOGRAM (TEE);  Surgeon: Prescott Gum, Collier Salina, MD;  Location: [place];  Service: Open Heart Surgery;  Laterality: N/A;   TEE WITHOUT CARDIOVERSION N/A 11/15/2019   Procedure: TRANSESOPHAGEAL ECHOCARDIOGRAM (TEE);  Surgeon: Prescott Gum, Collier Salina, MD;  Location: [place];  Service: Open Heart Surgery;  Laterality: N/A;   TONSILLECTOMY      Prior to Admission medications   Medication Sig Start Date End Date Taking? Authorizing Provider  Alirocumab (PRALUENT) 150 MG/ML SOAJ Inject 1 pen into the skin every 14 (fourteen) days. 09/01/20   Marrianne Mood D, PA-C  aspirin EC 81 MG tablet Take 1 tablet (81 mg total) by mouth daily. 09/17/20   Algernon Huxley, MD  carvedilol (COREG) 12.5 MG tablet Take 1 tablet (12.5 mg total) by mouth 2 (two) times daily. 05/20/20 05/15/21  End, Harrell Gave, MD  colchicine 0.6 MG tablet Take 1 tablet (0.6 mg total) by mouth 2 (two) times daily. 10/14/20   Glean Hess, MD  gabapentin (NEURONTIN) 300 MG capsule Take 1 capsule (300 mg total) by mouth at bedtime. 10/16/20   Kris Hartmann, NP  nitroGLYCERIN (NITROSTAT) 0.4 MG SL tablet Place 1 tablet (0.4 mg total) under the tongue every 5 (five) minutes as needed for chest pain. 08/08/15   Erlene Quan, PA-C  oxyCODONE (OXY IR/ROXICODONE) 5 MG immediate release tablet Take 1-2  tablets (5-10 mg total) by mouth every 6 (six) hours as needed for severe pain or moderate pain. 10/02/20   Stegmayer, Janalyn Harder, PA-C  sacubitril-valsartan (ENTRESTO) 49-51 MG Take 1 tablet by mouth 2 (two) times daily. Need to make an appointment for further refills 01/13/21 01/08/22  End, Harrell Gave, MD  silver sulfADIAZINE (SILVADENE) 1 % cream Apply 1 application topically daily. 01/11/21   Kris Hartmann, NP  traZODone (DESYREL) 50 MG tablet TAKE 1 TABLET BY MOUTH AT BEDTIME AS NEEDED FOR SLEEP. 01/30/20   Ivin Poot, MD    Allergies  Allergen Reactions   Brilinta [Ticagrelor] Shortness Of Breath   Chlorhexidine Gluconate Other (See Comments)    Skin burning for hours afterward   Contrast Media [Iodinated Diagnostic Agents] Itching    Face and head flushing, nose itching after contrast administation for angiogram   Statins Other (See Comments)    Failed Crestor 5 mg twice weekly,  Crestor 20 mg daily, Pravastatin 40 mg qd, Lipitor, Zocor - muscle aches   Zetia [Ezetimibe] Other (See Comments)    Muscle aches    Family History  Problem Relation Age of Onset   Heart attack Mother    Coronary artery disease Mother    Heart disease Mother        Carotid Stenosis and BPG and Heart Disease before age 32   Diabetes Mother    Hypertension Mother    Heart attack Father    Heart disease Father        BPG and Heart Disease before age 77   Hypertension Father    Cancer Father 41       throat   Stroke Father    Colon cancer Neg Hx    Colon polyps Neg Hx    Esophageal cancer Neg Hx    Rectal cancer Neg Hx    Stomach cancer Neg Hx     Social History Social History   Tobacco Use   Smoking status: Former    Packs/day: 1.25    Years: 35.00    Pack years: 43.75    Types: Cigarettes    Quit date: 02/28/2005    Years since quitting: 15.9   Smokeless tobacco: Current    Types: Snuff   Tobacco comments:    occaisionally  Vaping Use   Vaping Use: Never used  Substance Use  Topics   Alcohol use: Yes    Alcohol/week: 8.0 - 10.0 standard drinks    Types: 8 - 10 Glasses of wine per week    Comment: weekly   Drug use: No    Review of Systems Constitutional: Negative for fever. Cardiovascular: Negative for chest pain. Respiratory: Negative for shortness of breath. Gastrointestinal: Negative for abdominal pain, vomiting Musculoskeletal: Intermittent pain behind the left knee.  Intermittent pain to the left foot. Skin: Paleness of the left foot. Neurological: Negative for headache All other ROS negative  ____________________________________________   PHYSICAL EXAM:  VITAL SIGNS: ED Triage Vitals  Enc Vitals Group     BP 02/15/21 0812 94/73     Pulse Rate 02/15/21 0812 65     Resp 02/15/21 0812 17     Temp 02/15/21 0812 98.4 F (36.9 C)     Temp Source 02/15/21 0812 Oral     SpO2 02/15/21 0812 93 %     Weight 02/15/21 0808 187 lb 6.3 oz (85 kg)     Height 02/15/21 0808 5\' 6"  (1.676 m)     Head Circumference --      Peak Flow --      Pain Score 02/15/21 0808 8     Pain Loc --      Pain Edu? --      Excl. in [place]? --    Constitutional: Alert and oriented. Well appearing and in no distress. Eyes: Normal exam ENT      Head: Normocephalic and atraumatic.      Mouth/Throat: Mucous membranes are moist. Cardiovascular: Normal rate, regular rhythm Respiratory: Normal respiratory effort without tachypnea nor retractions. Breath sounds are clear Gastrointestinal: Soft and nontender. No distention. Musculoskeletal: Nontender with normal range of motion in all extremities.  Unable to palpate or Doppler DP or PT pulse.  Foot is nontender. Neurologic:  Normal speech and language. No gross focal neurologic deficits  Skin:  Skin is warm, dry.  Slight paleness to the left foot but it is somewhat warm to the touch.  No DP or PT  pulse palpated or dopplered. Psychiatric: Mood and affect are normal.  ____________________________________________      RADIOLOGY  Ultrasound of the left lower extremity negative for DVT but shows an occluded mid left SFA stent.  ____________________________________________   INITIAL IMPRESSION / ASSESSMENT AND PLAN / ED COURSE  Pertinent labs & imaging results that were available during my care of the patient were reviewed by me and considered in my medical decision making (see chart for details).   Patient presents to the emergency department for pain behind the left knee and intermittent pain to the left foot.  Somewhat pale appearing foot, although slightly warm to touch.  No DP or PT pulse dopplerable.  Follows up with Dr. Lucky Cowboy of vascular surgery.  Has had multiple revascularization procedures to the left lower extremity.  Ultrasound is negative for DVT but does appear to show an occluded left SFA stent.  We will discussed with vascular surgery.  We will check labs, COVID, start the patient on heparin and admit to the hospital.  I spoke with Dr. Delana Meyer of vascular surgery.  We will start the patient on heparin and admit to the hospitalist service.  REEF ACHTERBERG was evaluated in Emergency Department on 02/15/2021 for the symptoms described in the history of present illness. He was evaluated in the context of the global COVID-19 pandemic, which necessitated consideration that the patient might be at risk for infection with the SARS-CoV-2 virus that causes COVID-19. Institutional protocols and algorithms that pertain to the evaluation of patients at risk for COVID-19 are in a state of rapid change based on information released by regulatory bodies including the CDC and federal and state organizations. These policies and algorithms were followed during the patient's care in the ED.  CRITICAL CARE Performed by: Harvest Dark   Total critical care time: 30 minutes  Critical care time was exclusive of separately billable procedures and treating other patients.  Critical care was necessary to treat or  prevent imminent or life-threatening deterioration.  Critical care was time spent personally by me on the following activities: development of treatment plan with patient and/or surrogate as well as nursing, discussions with consultants, evaluation of patient's response to treatment, examination of patient, obtaining history from patient or surrogate, ordering and performing treatments and interventions, ordering and review of laboratory studies, ordering and review of radiographic studies, pulse oximetry and re-evaluation of patient's condition.  ____________________________________________   FINAL CLINICAL IMPRESSION(S) / ED DIAGNOSES  Arterial occlusion.   Harvest Dark, MD 02/15/21 [id number]    Harvest Dark, MD 02/15/21 [phone]

## 2021-02-15 NOTE — ED Notes (Signed)
When patient took shoe off, his foot was very pale from ankle down.  I went to get the doppler, and when I got back it is pink.

## 2021-02-15 NOTE — Telephone Encounter (Signed)
Patient was called back and he is at the hospital to have a procedure with Dr. Lucky Cowboy this afternoon.

## 2021-02-15 NOTE — ED Notes (Signed)
Saturday night squatted down and his left foot started feeling numb.  Says has stents in that leg.  Says pain with wt bearing.

## 2021-02-16 ENCOUNTER — Encounter: Payer: Self-pay | Admitting: Vascular Surgery

## 2021-02-16 DIAGNOSIS — I7090 Unspecified atherosclerosis: Secondary | ICD-10-CM

## 2021-02-16 DIAGNOSIS — Z789 Other specified health status: Secondary | ICD-10-CM

## 2021-02-16 LAB — BASIC METABOLIC PANEL
Anion gap: 9 (ref 5–15)
BUN: 24 mg/dL — ABNORMAL HIGH (ref 8–23)
CO2: 22 mmol/L (ref 22–32)
Calcium: 8.8 mg/dL — ABNORMAL LOW (ref 8.9–10.3)
Chloride: 105 mmol/L (ref 98–111)
Creatinine, Ser: 0.81 mg/dL (ref 0.61–1.24)
GFR, Estimated: >60 mL/min (ref >60)
Glucose, Bld: 138 mg/dL — ABNORMAL HIGH (ref 70–99)
Potassium: 4.6 mmol/L (ref 3.5–5.1)
Sodium: 136 mmol/L (ref 135–145)

## 2021-02-16 LAB — CBC
HCT: 40.3 % (ref 39.0–52.0)
Hemoglobin: 13.3 g/dL (ref 13.0–17.0)
MCH: 30.1 pg (ref 26.0–34.0)
MCHC: 33 g/dL (ref 30.0–36.0)
MCV: 91.2 fL (ref 80.0–100.0)
Platelets: 167 10*3/uL (ref 150–400)
RBC: 4.42 MIL/uL (ref 4.22–5.81)
RDW: 14.6 % (ref 11.5–15.5)
WBC: 7.4 10*3/uL (ref 4.0–10.5)
nRBC: 0 % (ref 0.0–0.2)

## 2021-02-16 LAB — GLUCOSE, CAPILLARY: Glucose-Capillary: 125 mg/dL — ABNORMAL HIGH (ref 70–99)

## 2021-02-16 MED ORDER — APIXABAN 5 MG PO TABS: 5.0000 mg | ORAL_TABLET | Freq: Two times a day (BID) | ORAL | 0 refills | Status: DC

## 2021-02-16 NOTE — Progress Notes (Addendum)
°  Transition of Care [facility]) Screening Note   Patient Details  Name: [name] Date of Birth: 04-Jul-1950   Transition of Care [facility]) CM/SW Contact:    Alberteen Sam, LCSW Phone Number: 02/16/2021, 9:07 AM   Patient to discharge this morning, per MD needs eliqiuis card. Copay card printed to floor and provded in dc packet to patient for $10 eliquis as patient is insured.   TOC was consulted for substance abuse resources but patient declines at this time.    Transition of Care Department [facility]) has reviewed patient and no TOC needs have been identified at this time. We will continue to monitor patient advancement through interdisciplinary progression rounds. If new patient transition needs arise, please place a TOC consult.

## 2021-02-18 ENCOUNTER — Telehealth: Payer: Self-pay

## 2021-02-18 NOTE — Telephone Encounter (Signed)
Patient states he received a letter stating his PA information was not received & will expire in 15 days. Please assist with 2023 forms

## 2021-02-18 NOTE — Telephone Encounter (Signed)
I spoke with the patient. I inquired if he was calling about his Entresto application. He advised he was calling about his Praluent renewal application.  I advised the last thing that was sent in for [place] was 12/09/20. The patient advised he is good on Entresto, but will need to re-submit for Praluent. He advised that the PASS, contacted him and was going to mail him a new form to complete, but it needed to be submitted in ~ 15 days and he will most likely not get this for ~10 days or so.  I advised I could go online and print a copy of the PASS form and he could come by the office and complete this.   He is aware I will pull a copy of his financial statements from his Entresto application to send in with this if needed.   The patient advised he will come by the office early next week to complete his portion of the application. He will just fill it out while here and leave with the front desk.  Will forward to Hoskins, RN for Dr. Saunders Revel as an Juluis Rainier.

## 2021-02-18 NOTE — Telephone Encounter (Signed)
Transition Care Management Follow-up Telephone Call Date of discharge and from where: 02/16/21 [facility] How have you been since you were released from the hospital? Pt states he is doing much better Any questions or concerns? No  Items Reviewed: Did the pt receive and understand the discharge instructions provided? Yes  Medications obtained and verified? Yes  Other? No  Any new allergies since your discharge? No  Dietary orders reviewed? Yes Do you have support at home? Yes   Home Care and Equipment/Supplies: Were home health services ordered? no  Were any new equipment or medical supplies ordered?  No  Functional Questionnaire: (I = Independent and D = Dependent) ADLs: I  Bathing/Dressing- I  Meal Prep- I  Eating- I  Maintaining continence- I  Transferring/Ambulation- I  Managing Meds- I  Follow up appointments reviewed:  PCP Hospital f/u appt confirmed? No  patient canceled hosp fu appt that was scheduled for 02/23/21. [facility] f/u appt confirmed? Yes  Scheduled to see Dr. Lucky Cowboy on 04/06/21. Are transportation arrangements needed? No  If their condition worsens, is the pt aware to call PCP or go to the Emergency Dept.? Yes Was the patient provided with contact information for the PCP's office or ED? Yes Was to pt encouraged to call back with questions or concerns? Yes

## 2021-02-19 NOTE — Discharge Summary (Signed)
Physician Discharge Summary   Patient: [name] MRN: [id number] DOB: @DOB   Admit date:     02/15/2021  Discharge date: 02/16/2021  Discharge Physician: Max Sane   PCP: Glean Hess, MD   Recommendations at discharge: 1. Outpt f/up with providers as requested  Discharge Diagnoses Principal Problem:   Ischemia of left lower extremity Active Problems:   Essential hypertension   PAD (peripheral artery disease) (HCC)   Ischemia of extremity   HLD (hyperlipidemia)   CAD (coronary artery disease)   Chronic diastolic CHF (congestive heart failure) (HCC)   Alcohol use   Arterial occlusion  Hospital Course    70 y.o. male with a past medical history of CHF, CAD status post multiple revascularization procedures of his left lower extremity presents to the emergency department for left foot discomfort and paleness.    Ischemia of left lower extremity and hx of PVD: Patient has history of PVD and admitted for occlusion of the left SFA stent. s/p successful left lower extremity angiogram and PTCA, Mechanical thrombectomy. Patient was doing very well with good return of circulation and requested to go home. Vascular surgery was in agreement with D/C plans  - Vascular surgery recommends Eliquis 5 mg PO bid at D/C   Essential hypertension -continue Coreg, Entresto,   HLD (hyperlipidemia) -Patient is getting Praluent injection every 2 weeks   CAD (coronary artery disease): S/p of CABG.  No chest pain -Continue aspirin and Coreg   Chronic diastolic CHF (congestive heart failure) ([place]): 2D echo on 08/10/2020 showed EF of 50-55%.  Patient does not have leg edema or JVD.  CHF seem to be compensated. -Patient is on Entresto        Pain control - [place] was reviewed. and patient was instructed, not to drive, operate heavy machinery, perform activities at heights, swimming or participation in water activities or provide  baby-sitting services while on Pain, Sleep and Anxiety Medications; until their outpatient Physician has advised to do so again. Also recommended to not to take more than prescribed Pain, Sleep and Anxiety Medications.   Consultants: Vascular surgery Procedures performed: Angio on 02/15/21  Disposition: Home Diet recommendation: Cardiac and Carb modified diet  DISCHARGE MEDICATION: Allergies as of 02/16/2021       Reactions   Brilinta [ticagrelor] Shortness Of Breath   Chlorhexidine Gluconate Other (See Comments)   Skin burning for hours afterward   Contrast Media [iodinated Diagnostic Agents] Itching   Face and head flushing, nose itching after contrast administation for angiogram   Statins Other (See Comments)   Failed Crestor 5 mg twice weekly, Crestor 20 mg daily, Pravastatin 40 mg qd, Lipitor, Zocor - muscle aches   Zetia [ezetimibe] Other (See Comments)   Muscle aches        Medication List     STOP taking these medications    gabapentin 300 MG capsule Commonly known as: NEURONTIN   oxyCODONE 5 MG immediate release tablet Commonly known as: Oxy IR/ROXICODONE   silver sulfADIAZINE 1 % cream Commonly known as: SILVADENE       TAKE these medications    apixaban 5 MG Tabs tablet Commonly known as: ELIQUIS Take 1 tablet (5 mg total) by mouth 2 (two) times daily.   aspirin EC 81 MG tablet Take 1 tablet (81 mg total) by mouth daily. What changed: when to take this   carvedilol 12.5 MG tablet Commonly known as: COREG Take 1 tablet (12.5 mg total) by  mouth 2 (two) times daily.   colchicine 0.6 MG tablet Take 1 tablet (0.6 mg total) by mouth 2 (two) times daily.   nitroGLYCERIN 0.4 MG SL tablet Commonly known as: Nitrostat Place 1 tablet (0.4 mg total) under the tongue every 5 (five) minutes as needed for chest pain.   Praluent 150 MG/ML So[place] Generic drug: Alirocumab Inject 1 pen into the skin every 14 (fourteen) days.   sacubitril-valsartan 49-51  MG Commonly known as: ENTRESTO Take 1 tablet by mouth 2 (two) times daily. Need to make an appointment for further refills   traZODone 50 MG tablet Commonly known as: DESYREL TAKE 1 TABLET BY MOUTH AT BEDTIME AS NEEDED FOR SLEEP.        Follow-up Information     Glean Hess, MD. Schedule an appointment as soon as possible for a visit on 02/23/2021.   Specialty: Internal Medicine Why: [facility] Discharge F/UP..@2pm  Contact information: [address] [place] [address] [phone]         Vickie Epley, MD. Schedule an appointment as soon as possible for a visit on 02/25/2021.   Specialties: Cardiology, Radiology Why: [facility] Discharge F/UP.Marland Kitchen@ 9:15am Contact information: [place] [address] [phone]         Algernon Huxley, MD. Schedule an appointment as soon as possible for a visit on 03/03/2021.   Specialties: Vascular Surgery, Radiology, Interventional Cardiology Why: Can see Dew or Arna Medici. Will need ABI with visit...@ 2pm                Discharge Exam: Filed Weights   02/15/21 0808 02/15/21 0811 02/16/21 0500  Weight: 85 kg 81.6 kg 82.8 kg    General: Not in acute distress HEENT:       Eyes: PERRL, EOMI, no scleral icterus.       ENT: No discharge from the ears and nose, no pharynx injection, no tonsillar enlargement.        Neck: No JVD, no bruit, no mass felt. Heme: No neck lymph node enlargement. Cardiac: S1/S2, RRR, No murmurs, No gallops or rubs. Respiratory: No rales, wheezing, rhonchi or rubs. GI: Soft, nondistended, nontender, no rebound pain, no organomegaly, BS present. GU: No hematuria Ext: No pitting leg edema bilaterally. 2+DP/PT pulse in right leg. Good circulation sign in LE Musculoskeletal: No joint deformities, No joint redness or warmth, no limitation of ROM in spin. Skin: No rashes.  Neuro: Alert, oriented X3, cranial nerves II-XII grossly intact, moves all  extremities normally.  Psych: Patient is not psychotic, no suicidal or hemocidal ideation.  Condition at discharge: good  The results of significant diagnostics from this hospitalization (including imaging, microbiology, ancillary and laboratory) are listed below for reference.   Imaging Studies: PERIPHERAL VASCULAR CATHETERIZATION  Result Date: 02/15/2021 See surgical note for result.  US Venous Img Lower Unilateral Left  Result Date: 02/15/2021 CLINICAL DATA:  Pain EXAM: LEFT LOWER EXTREMITY VENOUS DOPPLER ULTRASOUND TECHNIQUE: Gray-scale sonography with compression, as well as color and duplex ultrasound, were performed to evaluate the deep venous system(s) from the level of the common femoral vein through the popliteal and proximal calf veins. COMPARISON:  None. FINDINGS: VENOUS Normal compressibility of the common femoral, superficial femoral, and popliteal veins, as well as the visualized calf veins. Visualized portions of profunda femoral vein and great saphenous vein unremarkable. No filling defects to suggest DVT on grayscale or color Doppler imaging. Doppler waveforms show normal direction of venous flow, normal  respiratory phasicity and response to augmentation. Limited views of the contralateral common femoral vein are unremarkable. OTHER Stent in the mid left SFA, with no internal flow signal identified. Limitations: none IMPRESSION: 1. Negative for left lower extremity DVT. 2. Occluded mid left SFA stent. Electronically Signed   By: Lucrezia Europe M.D.   On: 02/15/2021 09:32    Microbiology: Results for orders placed or performed during the hospital encounter of 02/15/21  Resp Panel by RT-PCR (Flu A&B, Covid) Nasopharyngeal Swab     Status: None   Collection Time: 02/15/21 10:04 AM   Specimen: Nasopharyngeal Swab; Nasopharyngeal(NP) swabs in vial transport medium  Result Value Ref Range Status   SARS Coronavirus 2 by RT PCR NEGATIVE NEGATIVE Final    Comment: (NOTE) SARS-CoV-2  target nucleic acids are NOT DETECTED.  The SARS-CoV-2 RNA is generally detectable in upper respiratory specimens during the acute phase of infection. The lowest concentration of SARS-CoV-2 viral copies this assay can detect is 138 copies/mL. A negative result does not preclude SARS-Cov-2 infection and should not be used as the sole basis for treatment or other patient management decisions. A negative result may occur with  improper specimen collection/handling, submission of specimen other than nasopharyngeal swab, presence of viral mutation(s) within the areas targeted by this assay, and inadequate number of viral copies(<138 copies/mL). A negative result must be combined with clinical observations, patient history, and epidemiological information. The expected result is Negative.  Fact Sheet for Patients:  EntrepreneurPulse.com.au  Fact Sheet for Healthcare Providers:  IncredibleEmployment.be  This test is no t yet approved or cleared by the [place] FDA and  has been authorized for detection and/or diagnosis of SARS-CoV-2 by FDA under an Emergency Use Authorization (EUA). This EUA will remain  in effect (meaning this test can be used) for the duration of the COVID-19 declaration under Section 564(b)(1) of the Act, 21 U.S.C.section 360bbb-3(b)(1), unless the authorization is terminated  or revoked sooner.       Influenza A by PCR NEGATIVE NEGATIVE Final   Influenza B by PCR NEGATIVE NEGATIVE Final    Comment: (NOTE) The Xpert Xpress SARS-CoV-2/FLU/RSV plus assay is intended as an aid in the diagnosis of influenza from Nasopharyngeal swab specimens and should not be used as a sole basis for treatment. Nasal washings and aspirates are unacceptable for Xpert Xpress SARS-CoV-2/FLU/RSV testing.  Fact Sheet for Patients: EntrepreneurPulse.com.au  Fact Sheet for Healthcare  Providers: IncredibleEmployment.be  This test is not yet approved or cleared by the [place] FDA and has been authorized for detection and/or diagnosis of SARS-CoV-2 by FDA under an Emergency Use Authorization (EUA). This EUA will remain in effect (meaning this test can be used) for the duration of the COVID-19 declaration under Section 564(b)(1) of the Act, 21 U.S.C. section 360bbb-3(b)(1), unless the authorization is terminated or revoked.  Performed at [facility], [address] [place], [place] [address]     Labs: CBC: Recent Labs  Lab 02/15/21 1004 02/16/21 0614  WBC 4.1 7.4  HGB 14.3 13.3  HCT 44.6 40.3  MCV 92.9 91.2  PLT 153 786   Basic Metabolic Panel: Recent Labs  Lab 02/15/21 1004 02/15/21 1914 02/16/21 0614  NA 137 139 136  K 4.6 4.5 4.6  CL 107 108 105  CO2 26 24 22   GLUCOSE 102* 202* 138*  BUN 26* 26* 24*  CREATININE 0.98 0.86 0.81  CALCIUM 8.9 8.8* 8.8*   Liver Function Tests: Recent Labs  Lab 02/15/21 1004  AST 23  ALT 25  ALKPHOS 80  BILITOT 0.9  PROT 7.4  ALBUMIN 4.2   CBG: Recent Labs  Lab 02/16/21 0734  GLUCAP 125*    Discharge time spent: greater than 30 minutes.  Signed:  Max Sane MD.  [facility] 02/19/2021

## 2021-02-23 ENCOUNTER — Inpatient Hospital Stay: Payer: PPO | Admitting: Internal Medicine

## 2021-02-23 NOTE — Telephone Encounter (Signed)
Patient pick up forms & dropped off an envelope placed in box

## 2021-02-24 ENCOUNTER — Encounter: Payer: PPO | Admitting: Internal Medicine

## 2021-02-24 ENCOUNTER — Telehealth (INDEPENDENT_AMBULATORY_CARE_PROVIDER_SITE_OTHER): Payer: PPO | Admitting: Internal Medicine

## 2021-02-24 ENCOUNTER — Ambulatory Visit: Payer: Self-pay

## 2021-02-24 ENCOUNTER — Encounter: Payer: Self-pay | Admitting: Internal Medicine

## 2021-02-24 VITALS — Ht 66.0 in | Wt 182.6 lb

## 2021-02-24 DIAGNOSIS — U071 COVID-19: Secondary | ICD-10-CM

## 2021-02-24 MED ORDER — MOLNUPIRAVIR EUA 200MG CAPSULE: 4.0000 | ORAL_CAPSULE | Freq: Two times a day (BID) | ORAL | 0 refills | Status: AC

## 2021-02-24 NOTE — Telephone Encounter (Signed)
Called and spoke with pt.  Notified that I have sent pt assistance application with supporting documentation provided by pt.  Faxed to [employer] @ [phone]. Confirmation received via fax.  Paperwork placed in [employer].  Notified pt I will let him know once I receive a response from [employer].

## 2021-02-24 NOTE — Progress Notes (Signed)
Date:  02/24/2021   Name:  [name]   DOB:  [birth year]   MRN:  [id number]  This encounter was conducted via video encounter due to the need for social distancing in light of the Covid-19 pandemic.  The patient was correctly identified.  I advised that I am conducting the visit from a secure room in my office at [facility] clinic.  The patient is located at home. The limitations of this form of encounter were discussed with the patient and he/she agreed to proceed.  Some vital signs will be absent. Unable to connect via video so the visit was conducted by telephone.  Chief Complaint: Covid Positive Started to feel sick 2 days ago and yesterday has sx of a bad cold and the flu. Tested this AM at home and was positive for Covid.  Cough This is a new problem. The current episode started yesterday. The cough is Productive of sputum. Associated symptoms include headaches and myalgias. Pertinent negatives include no chest pain, chills, ear pain, fever, nasal congestion, sore throat, shortness of breath or wheezing.   Lab Results  Component Value Date   NA 136 02/16/2021   K 4.6 02/16/2021   CO2 22 02/16/2021   GLUCOSE 138 (H) 02/16/2021   BUN 24 (H) 02/16/2021   CREATININE 0.81 02/16/2021   CALCIUM 8.8 (L) 02/16/2021   EGFR 93 11/18/2020   GFRNONAA >60 02/16/2021   Lab Results  Component Value Date   CHOL 144 01/07/2020   HDL 62 01/07/2020   LDLCALC 50 01/07/2020   TRIG 158 (H) 01/07/2020   CHOLHDL 2.3 01/07/2020   Lab Results  Component Value Date   TSH 3.260 07/24/2020   Lab Results  Component Value Date   HGBA1C 5.6 11/07/2019   Lab Results  Component Value Date   WBC 7.4 02/16/2021   HGB 13.3 02/16/2021   HCT 40.3 02/16/2021   MCV 91.2 02/16/2021   PLT 167 02/16/2021   Lab Results  Component Value Date   ALT 25 02/15/2021   AST 23 02/15/2021   ALKPHOS 80 02/15/2021   BILITOT 0.9 02/15/2021   Lab Results  Component Value Date   VD25OH 42.7 09/06/2017      Review of Systems  Constitutional:  Positive for fatigue. Negative for chills and fever.  HENT:  Positive for congestion. Negative for ear pain and sore throat.   Respiratory:  Positive for cough. Negative for chest tightness, shortness of breath and wheezing.   Cardiovascular:  Negative for chest pain and palpitations.  Gastrointestinal:  Negative for diarrhea, nausea and vomiting.  Musculoskeletal:  Positive for myalgias.  Neurological:  Positive for headaches.  Psychiatric/Behavioral:  Negative for sleep disturbance.    Patient Active Problem List   Diagnosis Date Noted   Arterial occlusion    Ischemia of left lower extremity 02/15/2021   HLD (hyperlipidemia) 02/15/2021   CAD (coronary artery disease) 02/15/2021   Chronic diastolic CHF (congestive heart failure) ([place]) 02/15/2021   Alcohol use 02/15/2021   AKI (acute kidney injury) ([place]) 11/20/2020   Gouty arthritis of great toe 10/14/2020   Ischemia of extremity 09/28/2020   Postoperative atrial fibrillation ([place]) 05/21/2020   Statin myopathy 05/05/2020   Acquired thrombophilia ([place]) 01/08/2020   Post-operative state 12/18/2019   Postop check 11/27/2019   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   Accelerating angina ([place]) 11/05/2019   Left bundle branch block 11/05/2019   Shortness of breath    Stricture and stenosis of esophagus  Polyp of colon    Rectal polyp    CAD, multiple vessel 08/13/2018   PAD (peripheral artery disease) (HCC) 08/13/2018   Carotid stenosis 07/17/2018   Atherosclerosis of artery of extremity with rest pain (HCC) 06/04/2018   Chronic systolic heart failure (HCC) 03/12/2018   Nonischemic cardiomyopathy (HCC) 12/08/2017   Myalgia due to statin 12/08/2017   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 (HCC) 05/01/2017   Foot pain, bilateral 05/01/2017   Degenerative disc disease, 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   History of tobacco use 07/13/2016   Overweight (BMI 25.0-29.9) 07/13/2016   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   Dysphagia 11/26/2015   Chronic anticoagulation: plavix 10/15/2015   Chronic left hip pain 09/15/2015   Coronary artery disease involving native coronary artery of native heart without angina pectoris 08/08/2015   Essential hypertension 08/08/2015   Mixed hyperlipidemia 01/29/2013   History of CEA (carotid endarterectomy) 11/08/2011    Allergies  Allergen Reactions   Brilinta [Ticagrelor] Shortness Of Breath   Chlorhexidine Gluconate Other (See Comments)    Skin burning for hours afterward   Contrast Media [Iodinated Contrast Media] Itching    Face and head flushing, nose itching after contrast administation for angiogram   Statins Other (See Comments)    Failed Crestor 5 mg twice weekly, Crestor 20 mg daily, Pravastatin 40 mg qd, Lipitor, Zocor - muscle aches   Zetia [Ezetimibe] Other (See Comments)    Muscle aches    Past Surgical History:  Procedure Laterality Date   APPENDECTOMY     BACK SURGERY     BIV ICD INSERTION CRT-D N/A 03/23/2020   Procedure: BIV ICD INSERTION CRT-D;  Surgeon: Lanier Prude, MD;  Location: [facility] INVASIVE CV LAB;  Service: Cardiovascular;  Laterality: N/A;   CARDIAC CATHETERIZATION N/A 08/07/2015   Procedure: Left Heart Cath and Coronary Angiography;  Surgeon: Jake Bathe, MD;  Location: [facility] INVASIVE CV LAB;  Service: Cardiovascular;  Laterality: N/A;   CARDIAC CATHETERIZATION N/A 08/07/2015   Procedure: Coronary Stent Intervention;  Surgeon: Jake Bathe, MD;  Location: [facility] INVASIVE CV LAB;  Service: Cardiovascular;  Laterality: N/A;   CARDIAC CATHETERIZATION N/A 08/07/2015   Procedure: Coronary Stent Intervention;  Surgeon: Peter M Swaziland, MD;  Location: [facility] INVASIVE CV LAB;  Service: Cardiovascular;  Laterality: N/A;   CAROTID ENDARTERECTOMY  01/05/2006   Right  CEA with DPA   CATARACT EXTRACTION W/ INTRAOCULAR LENS IMPLANT Left 12/04/2017   CATARACT EXTRACTION W/PHACO Left 12/04/2017   Procedure: CATARACT EXTRACTION  PHACO AND INTRAOCULAR LENS PLACEMENT (IOC) LEFT;  Surgeon: Nevada Crane, MD;  Location: [facility];  Service: Ophthalmology;  Laterality: Left;   CATARACT EXTRACTION W/PHACO Right 02/06/2018   Procedure: CATARACT EXTRACTION PHACO AND INTRAOCULAR LENS PLACEMENT (IOC)RIGHT;  Surgeon: Nevada Crane, MD;  Location: [facility];  Service: Ophthalmology;  Laterality: Right;   COLONOSCOPY  05/20/2008   COLONOSCOPY WITH PROPOFOL N/A 09/13/2018   Procedure: COLONOSCOPY WITH BIOPSY;  Surgeon: Midge Minium, MD;  Location: [facility];  Service: Endoscopy;  Laterality: N/A;   CORONARY ARTERY BYPASS GRAFT N/A 11/11/2019   Procedure: CORONARY ARTERY BYPASS GRAFTING (CABG) USING LIMA to Diag1; ENDOSCOPICALLY HARVESTED RIGHT GREATER SAPHENOUS VEIN: SVG to OM1; SVG to OM2; SVG to PDA.;  Surgeon: Kerin Perna, MD;  Location: [facility] OR;  Service: Open Heart Surgery;  Laterality: N/A;   CORONARY STENT PLACEMENT  08/07/2015   MID CIRCUMFLEX   ENDARTERECTOMY FEMORAL Bilateral 09/30/2020   Procedure: ENDARTERECTOMY FEMORAL;  Surgeon: Algernon Huxley, MD;  Location: [facility] ORS;  Service: Vascular;  Laterality: Bilateral;   ENDOVEIN HARVEST OF GREATER SAPHENOUS VEIN Right 11/11/2019   Procedure: ENDOVEIN HARVEST OF GREATER SAPHENOUS VEIN;  Surgeon: Ivin Poot, MD;  Location: [place];  Service: Open Heart Surgery;  Laterality: Right;   ESOPHAGOGASTRODUODENOSCOPY (EGD) WITH PROPOFOL N/A 02/11/2019   Procedure: ESOPHAGOGASTRODUODENOSCOPY (EGD) WITH BIOPSY and  Dilation;  Surgeon: [name], MD;  Location: [place];  Service: Endoscopy;  Laterality: N/A;   HIP SURGERY Left 10/2016   left hip tendon repair   LEFT HEART CATH AND CORONARY ANGIOGRAPHY N/A 11/27/2017   Procedure: LEFT HEART CATH AND CORONARY ANGIOGRAPHY;  Surgeon: Wellington Hampshire, MD;  Location: [place] CV LAB;  Service: Cardiovascular;  Laterality: N/A;   LOWER EXTREMITY ANGIOGRAPHY Left 02/12/2018   Procedure:  LOWER EXTREMITY ANGIOGRAPHY;  Surgeon: Algernon Huxley, MD;  Location: [place] CV LAB;  Service: Cardiovascular;  Laterality: Left;   LOWER EXTREMITY ANGIOGRAPHY Left 03/07/2018   Procedure: LOWER EXTREMITY ANGIOGRAPHY;  Surgeon: Algernon Huxley, MD;  Location: [place] CV LAB;  Service: Cardiovascular;  Laterality: Left;   LOWER EXTREMITY ANGIOGRAPHY Left 06/04/2018   Procedure: LOWER EXTREMITY ANGIOGRAPHY;  Surgeon: Algernon Huxley, MD;  Location: [place] CV LAB;  Service: Cardiovascular;  Laterality: Left;   LOWER EXTREMITY ANGIOGRAPHY Left 09/17/2020   Procedure: LOWER EXTREMITY ANGIOGRAPHY;  Surgeon: Algernon Huxley, MD;  Location: [place] CV LAB;  Service: Cardiovascular;  Laterality: Left;   LOWER EXTREMITY ANGIOGRAPHY Left 09/28/2020   Procedure: LOWER EXTREMITY ANGIOGRAPHY;  Surgeon: Algernon Huxley, MD;  Location: [place] CV LAB;  Service: Cardiovascular;  Laterality: Left;   LOWER EXTREMITY ANGIOGRAPHY N/A 02/15/2021   Procedure: LOWER EXTREMITY ANGIOGRAPHY;  Surgeon: Algernon Huxley, MD;  Location: [place] CV LAB;  Service: Cardiovascular;  Laterality: N/A;   PLACEMENT OF IMPELLA LEFT VENTRICULAR ASSIST DEVICE N/A 11/11/2019   Procedure: PLACEMENT OF IMPELLA LEFT VENTRICULAR ASSIST DEVICE 5.5;  Surgeon: Ivin Poot, MD;  Location: [place];  Service: Open Heart Surgery;  Laterality: N/A;  Midline Sternotomy   POLYPECTOMY N/A 09/13/2018   Procedure: POLYPECTOMY;  Surgeon: [name], MD;  Location: [place];  Service: Endoscopy;  Laterality: N/A;   POLYPECTOMY N/A 02/11/2019   Procedure: POLYPECTOMY;  Surgeon: [name], MD;  Location: [place];  Service: Endoscopy;  Laterality: N/A;   REMOVAL OF IMPELLA LEFT VENTRICULAR ASSIST DEVICE N/A 11/15/2019   Procedure: REMOVAL OF IMPELLA 5.5 LEFT VENTRICULAR ASSIST DEVICE;  Surgeon: Ivin Poot, MD;  Location: [place];  Service: Open Heart Surgery;  Laterality: N/A;   RIGHT/LEFT HEART CATH AND CORONARY  ANGIOGRAPHY N/A 11/05/2019   Procedure: RIGHT/LEFT HEART CATH AND CORONARY ANGIOGRAPHY;  Surgeon: Nelva Bush, MD;  Location: [place] CV LAB;  Service: Cardiovascular;  Laterality: N/A;   SPINE SURGERY     TEE WITHOUT CARDIOVERSION N/A 11/11/2019   Procedure: TRANSESOPHAGEAL ECHOCARDIOGRAM (TEE);  Surgeon: Prescott Gum, Collier Salina, MD;  Location: [place];  Service: Open Heart Surgery;  Laterality: N/A;   TEE WITHOUT CARDIOVERSION N/A 11/15/2019   Procedure: TRANSESOPHAGEAL ECHOCARDIOGRAM (TEE);  Surgeon: Prescott Gum, Collier Salina, MD;  Location: [place];  Service: Open Heart Surgery;  Laterality: N/A;   TONSILLECTOMY      Social History   Tobacco Use   Smoking status: Former    Packs/day: 1.25    Years: 35.00    Pack years: 43.75    Types: Cigarettes  Quit date: 02/28/2005    Years since quitting: 16.0   Smokeless tobacco: Current    Types: Snuff   Tobacco comments:    occaisionally  Vaping Use   Vaping Use: Never used  Substance Use Topics   Alcohol use: Yes    Alcohol/week: 8.0 - 10.0 standard drinks    Types: 8 - 10 Glasses of wine per week    Comment: weekly   Drug use: No     Medication list has been reviewed and updated.  Current Meds  Medication Sig   Alirocumab (PRALUENT) 150 MG/ML SOAJ Inject 1 pen into the skin every 14 (fourteen) days.   apixaban (ELIQUIS) 5 MG TABS tablet Take 1 tablet (5 mg total) by mouth 2 (two) times daily.   aspirin EC 81 MG tablet Take 1 tablet (81 mg total) by mouth daily. (Patient taking differently: Take 81 mg by mouth at bedtime.)   carvedilol (COREG) 12.5 MG tablet Take 1 tablet (12.5 mg total) by mouth 2 (two) times daily.   colchicine 0.6 MG tablet Take 1 tablet (0.6 mg total) by mouth 2 (two) times daily.   nitroGLYCERIN (NITROSTAT) 0.4 MG SL tablet Place 1 tablet (0.4 mg total) under the tongue every 5 (five) minutes as needed for chest pain.   sacubitril-valsartan (ENTRESTO) 49-51 MG Take 1 tablet by mouth 2 (two) times daily. Need to make  an appointment for further refills   traZODone (DESYREL) 50 MG tablet TAKE 1 TABLET BY MOUTH AT BEDTIME AS NEEDED FOR SLEEP.    PHQ 2/9 Scores 02/24/2021 10/14/2020 09/23/2020 09/23/2019  PHQ - 2 Score 2 2 0 0  PHQ- 9 Score 2 2 - -    GAD 7 : Generalized Anxiety Score 02/24/2021 10/14/2020 08/26/2019 05/21/2019  Nervous, Anxious, on Edge 0 0 0 0  Control/stop worrying 0 0 0 0  Worry too much - different things 0 0 0 0  Trouble relaxing 0 0 0 0  Restless 0 0 0 0  Easily annoyed or irritable 0 0 0 0  Afraid - awful might happen 0 0 0 0  Total GAD 7 Score 0 0 0 0  Anxiety Difficulty Not difficult at all - Not difficult at all Not difficult at all    BP Readings from Last 3 Encounters:  02/16/21 (!) 125/95  11/24/20 129/76  11/18/20 132/82    Physical Exam Pulmonary:     Effort: Pulmonary effort is normal.     Comments: Occasional dry cough heard Neurological:     Mental Status: He is alert.  Psychiatric:        Mood and Affect: Mood normal.    Wt Readings from Last 3 Encounters:  02/24/21 182 lb 9.6 oz (82.8 kg)  02/16/21 182 lb 9.6 oz (82.8 kg)  11/24/20 189 lb (85.7 kg)    Ht $R'5\' 6"'sW$  (1.676 m)    Wt 182 lb 9.6 oz (82.8 kg)    BMI 29.47 kg/m   Assessment and Plan: 1. COVID-19 virus infection Take Tylenol 650 - 1000 mg every 6-8 hours for fever, body aches and headache. Drink plenty of fluids with electrolytes. Monitor for fever that does not decrease and/or shortness of breath that worsens or is present at rest. Quarantine for 5 days. - molnupiravir EUA (LAGEVRIO) 200 mg CAPS capsule; Take 4 capsules (800 mg total) by mouth 2 (two) times daily for 5 days.  Dispense: 40 capsule; Refill: 0   I spent 7 minutes on this encounter, 100% of  the visit was via telephone. Partially dictated using Editor, commissioning. Any errors are unintentional.  Halina Maidens, MD [facility]  02/24/2021

## 2021-02-24 NOTE — Telephone Encounter (Signed)
°  Chief Complaint: covid positive today , requesting antiviral medicaiton Symptoms: flu like sx, congestion, cough  Frequency: 02/22/21 sx started  Pertinent Negatives: Patient denies chest pain , difficulty breathing, fever Disposition: [] ED /[] Urgent Care (no appt availability in office) / [x] Appointment(In office/virtual)/ []  [facility]/ [] Home Care/ [] Refused Recommended Disposition  Additional Notes:  Called [facility] to notify My Chart VV scheduled today             Reason for Disposition  [1] HIGH RISK for severe COVID complications (e.g., weak immune system, age > 65 years, obesity with BMI > 25, pregnant, chronic lung disease or other chronic medical condition) AND [2] COVID symptoms (e.g., cough, fever)  (Exceptions: Already seen by PCP and no new or worsening symptoms.)  Answer Assessment - Initial Assessment Questions 1. COVID-19 DIAGNOSIS: "Who made your COVID-19 diagnosis?" "Was it confirmed by a positive lab test or self-test?" If not diagnosed by a doctor (or NP/PA), ask "Are there lots of cases (community spread) where you live?" Note: See public health department website, if unsure.     2 at home tests last today positive for covid  2. COVID-19 EXPOSURE: "Was there any known exposure to COVID before the symptoms began?" CDC Definition of close contact: within 6 feet (2 meters) for a total of 15 minutes or more over a 24-hour period.      na 3. ONSET: "When did the COVID-19 symptoms start?"      02/22/21 Monday  4. WORST SYMPTOM: "What is your worst symptom?" (e.g., cough, fever, shortness of breath, muscle aches)     Flu like sx, congestion , cough 5. COUGH: "Do you have a cough?" If Yes, ask: "How bad is the cough?"       yes 6. FEVER: "Do you have a fever?" If Yes, ask: "What is your temperature, how was it measured, and when did it start?"     no 7. RESPIRATORY STATUS: "Describe your breathing?" (e.g., shortness of breath, wheezing, unable to speak)       Na  8. BETTER-SAME-WORSE: "Are you getting better, staying the same or getting worse compared to yesterday?"  If getting worse, ask, "In what way?"     Worse yesterday  9. HIGH RISK DISEASE: "Do you have any chronic medical problems?" (e.g., asthma, heart or lung disease, weak immune system, obesity, etc.)     See hx 10. VACCINE: "Have you had the COVID-19 vaccine?" If Yes, ask: "Which one, how many shots, when did you get it?"       Yes  11. BOOSTER: "Have you received your COVID-19 booster?" If Yes, ask: "Which one and when did you get it?"       Yes  12. PREGNANCY: "Is there any chance you are pregnant?" "When was your last menstrual period?"       na 13. OTHER SYMPTOMS: "Do you have any other symptoms?"  (e.g., chills, fatigue, headache, loss of smell or taste, muscle pain, sore throat)       Flu like sx. Congestion, cough  14. O2 SATURATION MONITOR:  "Do you use an oxygen saturation monitor (pulse oximeter) at home?" If Yes, ask "What is your reading (oxygen level) today?" "What is your usual oxygen saturation reading?" (e.g., 95%)       ns  Protocols used: Coronavirus (COVID-19) Diagnosed or Suspected-A-AH

## 2021-02-24 NOTE — Telephone Encounter (Signed)
Patient called, left VM to return the call to the office to discuss symptoms with a nurse.  Summary: Positive Covid test, req medication   Patient called in to say that he have been achy in the neck area like with the flu and had a little congestion so he tested for Covid 2xs and was positive. Would like to know if there is medication to be prescribed for him and what to do. Please call patient at Ph#   [phone]

## 2021-02-25 ENCOUNTER — Ambulatory Visit: Payer: PPO | Admitting: Physician Assistant

## 2021-02-25 ENCOUNTER — Ambulatory Visit: Payer: Self-pay | Admitting: *Deleted

## 2021-02-25 NOTE — Telephone Encounter (Signed)
Reason for Disposition  Caller has already spoken with the PCP and has no further questions.  Protocols used: No Contact or Duplicate Contact Call-A-AH  Summary: request Rx for cough medication   Patient called back to inform Dr Army Melia that he would like the Rx sent to his pharmacy for the cough medicine that they discussed on 02/24/21. Any questions please call Ph# [phone]  [id number] [address], [place]  Phone: [phone]  Fax: [phone]     Routing to physician for review.

## 2021-02-25 NOTE — Telephone Encounter (Signed)
Please advise for cough medicine. See notes.

## 2021-02-26 ENCOUNTER — Other Ambulatory Visit: Payer: Self-pay | Admitting: Internal Medicine

## 2021-02-26 DIAGNOSIS — U071 COVID-19: Secondary | ICD-10-CM

## 2021-02-26 MED ORDER — PROMETHAZINE-DM 6.25-15 MG/5ML PO SYRP: 5.0000 mL | ORAL_SOLUTION | Freq: Four times a day (QID) | ORAL | 0 refills | Status: DC | PRN

## 2021-02-26 NOTE — Telephone Encounter (Signed)
Called patient and left VM informing of cough medication sent to pharmacy.

## 2021-03-02 ENCOUNTER — Encounter (INDEPENDENT_AMBULATORY_CARE_PROVIDER_SITE_OTHER): Payer: PPO

## 2021-03-02 ENCOUNTER — Ambulatory Visit (INDEPENDENT_AMBULATORY_CARE_PROVIDER_SITE_OTHER): Payer: PPO | Admitting: Vascular Surgery

## 2021-03-03 ENCOUNTER — Telehealth: Payer: Self-pay | Admitting: Internal Medicine

## 2021-03-03 ENCOUNTER — Ambulatory Visit (INDEPENDENT_AMBULATORY_CARE_PROVIDER_SITE_OTHER): Payer: PPO | Admitting: Nurse Practitioner

## 2021-03-03 ENCOUNTER — Encounter (INDEPENDENT_AMBULATORY_CARE_PROVIDER_SITE_OTHER): Payer: PPO

## 2021-03-03 NOTE — Telephone Encounter (Signed)
Copied from [place] [id number]. Topic: General - Other >> Mar 03, 2021  8:09 AM Leward Quan A wrote: Reason for CRM: Patient called in to get an appointment to have his ears flushed stated that every time he has had it done it was a nurse but was informed that he need to be scheduled with his PCP. No appointments available until 03/05/21 but patient does not want to wait. Also was diagnosed with Covid on 02/24/2021. Please advise and call patient at Ph# [phone]

## 2021-03-03 NOTE — Telephone Encounter (Signed)
Received PAF response re [employer] - pt does not meet income criteria.  Called and notified pt. Pt will contact [employer] PA program to discuss further.  Response placed in designated file cabinet.

## 2021-03-04 ENCOUNTER — Ambulatory Visit
Admission: EM | Admit: 2021-03-04 | Discharge: 2021-03-04 | Disposition: A | Discharge status: Home / Self Care | Payer: PPO

## 2021-03-04 ENCOUNTER — Other Ambulatory Visit: Payer: Self-pay

## 2021-03-04 DIAGNOSIS — H6122 Impacted cerumen, left ear: Secondary | ICD-10-CM | POA: Diagnosis not present

## 2021-03-04 NOTE — ED Triage Notes (Signed)
Pt here with C/O both ears being clogged up. Denies pain

## 2021-03-04 NOTE — ED Provider Notes (Signed)
[facility]    CSN: [id number] Arrival date & time: 03/04/21  0804      History   Chief Complaint Chief Complaint  Patient presents with   Otalgia    HPI [name] is a 71 y.o. male who presents with L ear feeling stuffy x 3 days. He denies pain.    Past Medical History:  Diagnosis Date   Abnormal nuclear cardiac imaging test 08/08/2015   Arthritis    fingers   Carotid artery occlusion    CHF (congestive heart failure) (HCC)    Coronary atherosclerosis of native coronary artery 01/29/2013   11/05/19 R/LHC 80% dLMCA stenosis small diffusely dz dLAD, chronically occluded OM1 50%mRCA lesion, widely patent mLCx strent, moderately elevated L heart filling pressures, mild to moderate RH filling pressures, normal to moderately reduced CO   Duodenal erosion    Encounter for screening for lung cancer 07/13/2016   Esophageal stenosis    esophageal dilation   GERD (gastroesophageal reflux disease)    H. pylori infection    Heart attack ([place]) Oct. 2009   Mild   Hiatal hernia    Hyperlipidemia    Hypertension    Old myocardial infarction 11/29/2007   Mildly elevated troponin, isolated value in October 2009. Cardiac catheterization-nonobstructive 60% RCA disease-subsequent nuclear stress test-9 minutes, low risk, mild inferior wall hypokinesis    Pain in limb 12/19/2017   Peripheral vascular disease ([place])    Unstable angina ([place]) 11/25/2017    Patient Active Problem List   Diagnosis Date Noted   Arterial occlusion    Ischemia of left lower extremity 02/15/2021   HLD (hyperlipidemia) 02/15/2021   CAD (coronary artery disease) 02/15/2021   Chronic diastolic CHF (congestive heart failure) ([place]) 02/15/2021   Alcohol use 02/15/2021   AKI (acute kidney injury) ([place]) 11/20/2020   Gouty arthritis of great toe 10/14/2020   Ischemia of extremity 09/28/2020   Postoperative atrial fibrillation ([place]) 05/21/2020   Statin myopathy 05/05/2020   Acquired thrombophilia ([place])  01/08/2020   Post-operative state 12/18/2019   Postop check 11/27/2019   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   Accelerating angina ([place]) 11/05/2019   Left bundle branch block 11/05/2019   Shortness of breath    Stricture and stenosis of esophagus    Polyp of colon    Rectal polyp    CAD, multiple vessel 08/13/2018   PAD (peripheral artery disease) ([place]) 08/13/2018   Carotid stenosis 07/17/2018   Atherosclerosis of artery of extremity with rest pain ([place]) 03/54/6568   Chronic systolic heart failure ([place]) 03/12/2018   Nonischemic cardiomyopathy ([place]) 12/08/2017   Myalgia due to statin 12/08/2017   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 ([place]) 05/01/2017   Foot pain, bilateral 05/01/2017   Degenerative disc disease, 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   History of tobacco use 07/13/2016   Overweight (BMI 25.0-29.9) 07/13/2016   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   Dysphagia 11/26/2015   Chronic anticoagulation: plavix 10/15/2015   Chronic left hip pain 09/15/2015   Coronary artery disease involving native coronary artery of native heart without angina pectoris 08/08/2015   Essential hypertension 08/08/2015   Mixed hyperlipidemia 01/29/2013   History of CEA (carotid endarterectomy) 11/08/2011    Past Surgical History:  Procedure Laterality Date   APPENDECTOMY     BACK SURGERY     BIV ICD INSERTION CRT-D N/A 03/23/2020   Procedure: BIV ICD INSERTION CRT-D;  Surgeon: Vickie Epley, MD;  Location: [place] [facility];  Service: Cardiovascular;  Laterality: N/A;   CARDIAC CATHETERIZATION N/A 08/07/2015   Procedure: Left Heart Cath and Coronary Angiography;  Surgeon: [name], MD;  Location: [place] [facility];  Service: Cardiovascular;  Laterality: N/A;   CARDIAC CATHETERIZATION N/A 08/07/2015   Procedure: Coronary Stent Intervention;  Surgeon: [name], MD;  Location: [place] [facility];  Service: Cardiovascular;  Laterality: N/A;   CARDIAC CATHETERIZATION N/A 08/07/2015    Procedure: Coronary Stent Intervention;  Surgeon: [name], MD;  Location: [place] [facility];  Service: Cardiovascular;  Laterality: N/A;   CAROTID ENDARTERECTOMY  01/05/2006   Right  CEA with DPA   CATARACT EXTRACTION W/ INTRAOCULAR LENS IMPLANT Left 12/04/2017   CATARACT EXTRACTION W/PHACO Left 12/04/2017   Procedure: CATARACT EXTRACTION PHACO AND INTRAOCULAR LENS PLACEMENT ([place]) LEFT;  Surgeon: Eulogio Bear, MD;  Location: [place];  Service: Ophthalmology;  Laterality: Left;   CATARACT EXTRACTION W/PHACO Right 02/06/2018   Procedure: CATARACT EXTRACTION PHACO AND INTRAOCULAR LENS PLACEMENT (IOC)RIGHT;  Surgeon: Eulogio Bear, MD;  Location: [place];  Service: Ophthalmology;  Laterality: Right;   COLONOSCOPY  05/20/2008   COLONOSCOPY WITH PROPOFOL N/A 09/13/2018   Procedure: COLONOSCOPY WITH BIOPSY;  Surgeon: [name], MD;  Location: [place];  Service: Endoscopy;  Laterality: N/A;   CORONARY ARTERY BYPASS GRAFT N/A 11/11/2019   Procedure: CORONARY ARTERY BYPASS GRAFTING (CABG) USING LIMA to Diag1; ENDOSCOPICALLY HARVESTED RIGHT GREATER SAPHENOUS VEIN: SVG to OM1; SVG to OM2; SVG to PDA.;  Surgeon: Ivin Poot, MD;  Location: [place];  Service: Open Heart Surgery;  Laterality: N/A;   CORONARY STENT PLACEMENT  08/07/2015   MID CIRCUMFLEX   ENDARTERECTOMY FEMORAL Bilateral 09/30/2020   Procedure: ENDARTERECTOMY FEMORAL;  Surgeon: Algernon Huxley, MD;  Location: [facility];  Service: Vascular;  Laterality: Bilateral;   ENDOVEIN HARVEST OF GREATER SAPHENOUS VEIN Right 11/11/2019   Procedure: ENDOVEIN HARVEST OF GREATER SAPHENOUS VEIN;  Surgeon: Ivin Poot, MD;  Location: [place];  Service: Open Heart Surgery;  Laterality: Right;   ESOPHAGOGASTRODUODENOSCOPY (EGD) WITH PROPOFOL N/A 02/11/2019   Procedure: ESOPHAGOGASTRODUODENOSCOPY (EGD) WITH BIOPSY and  Dilation;  Surgeon: [name], MD;  Location: [place];  Service: Endoscopy;   Laterality: N/A;   HIP SURGERY Left 10/2016   left hip tendon repair   LEFT HEART CATH AND CORONARY ANGIOGRAPHY N/A 11/27/2017   Procedure: LEFT HEART CATH AND CORONARY ANGIOGRAPHY;  Surgeon: Wellington Hampshire, MD;  Location: [place] [facility];  Service: Cardiovascular;  Laterality: N/A;   LOWER EXTREMITY ANGIOGRAPHY Left 02/12/2018   Procedure: LOWER EXTREMITY ANGIOGRAPHY;  Surgeon: Algernon Huxley, MD;  Location: [place] [facility];  Service: Cardiovascular;  Laterality: Left;   LOWER EXTREMITY ANGIOGRAPHY Left 03/07/2018   Procedure: LOWER EXTREMITY ANGIOGRAPHY;  Surgeon: Algernon Huxley, MD;  Location: [place] [facility];  Service: Cardiovascular;  Laterality: Left;   LOWER EXTREMITY ANGIOGRAPHY Left 06/04/2018   Procedure: LOWER EXTREMITY ANGIOGRAPHY;  Surgeon: Algernon Huxley, MD;  Location: [place] [facility];  Service: Cardiovascular;  Laterality: Left;   LOWER EXTREMITY ANGIOGRAPHY Left 09/17/2020   Procedure: LOWER EXTREMITY ANGIOGRAPHY;  Surgeon: Algernon Huxley, MD;  Location: [place] [facility];  Service: Cardiovascular;  Laterality: Left;   LOWER EXTREMITY ANGIOGRAPHY Left 09/28/2020   Procedure: LOWER EXTREMITY ANGIOGRAPHY;  Surgeon: Algernon Huxley, MD;  Location: [place] [facility];  Service: Cardiovascular;  Laterality: Left;   LOWER EXTREMITY ANGIOGRAPHY N/A 02/15/2021  Procedure: LOWER EXTREMITY ANGIOGRAPHY;  Surgeon: Algernon Huxley, MD;  Location: [place] [facility];  Service: Cardiovascular;  Laterality: N/A;   PLACEMENT OF IMPELLA LEFT VENTRICULAR ASSIST DEVICE N/A 11/11/2019   Procedure: PLACEMENT OF IMPELLA LEFT VENTRICULAR ASSIST DEVICE 5.5;  Surgeon: Ivin Poot, MD;  Location: [place];  Service: Open Heart Surgery;  Laterality: N/A;  Midline Sternotomy   POLYPECTOMY N/A 09/13/2018   Procedure: POLYPECTOMY;  Surgeon: [name], MD;  Location: [place];  Service: Endoscopy;  Laterality: N/A;   POLYPECTOMY N/A 02/11/2019   Procedure: POLYPECTOMY;  Surgeon: [name], MD;  Location: [place];  Service: Endoscopy;  Laterality: N/A;   REMOVAL OF IMPELLA LEFT VENTRICULAR ASSIST DEVICE N/A 11/15/2019   Procedure: REMOVAL OF IMPELLA 5.5 LEFT VENTRICULAR ASSIST DEVICE;  Surgeon: Ivin Poot, MD;  Location: [place];  Service: Open Heart Surgery;  Laterality: N/A;   RIGHT/LEFT HEART CATH AND CORONARY ANGIOGRAPHY N/A 11/05/2019   Procedure: RIGHT/LEFT HEART CATH AND CORONARY ANGIOGRAPHY;  Surgeon: Nelva Bush, MD;  Location: [place] [facility];  Service: Cardiovascular;  Laterality: N/A;   SPINE SURGERY     TEE WITHOUT CARDIOVERSION N/A 11/11/2019   Procedure: TRANSESOPHAGEAL ECHOCARDIOGRAM (TEE);  Surgeon: Prescott Gum, Collier Salina, MD;  Location: [place];  Service: Open Heart Surgery;  Laterality: N/A;   TEE WITHOUT CARDIOVERSION N/A 11/15/2019   Procedure: TRANSESOPHAGEAL ECHOCARDIOGRAM (TEE);  Surgeon: Prescott Gum, Collier Salina, MD;  Location: [place];  Service: Open Heart Surgery;  Laterality: N/A;   TONSILLECTOMY         Home Medications    Prior to Admission medications   Medication Sig Start Date End Date Taking? Authorizing Provider  Alirocumab (PRALUENT) 150 MG/ML SOAJ Inject 1 pen into the skin every 14 (fourteen) days. 09/01/20  Yes Visser, Jacquelyn D, PA-C  apixaban (ELIQUIS) 5 MG TABS tablet Take 1 tablet (5 mg total) by mouth 2 (two) times daily. 02/16/21  Yes Max Sane, MD  aspirin EC 81 MG tablet Take 1 tablet (81 mg total) by mouth daily. Patient taking differently: Take 81 mg by mouth at bedtime. 09/17/20  Yes Dew, Erskine Squibb, MD  carvedilol (COREG) 12.5 MG tablet Take 1 tablet (12.5 mg total) by mouth 2 (two) times daily. 05/20/20 05/15/21 Yes End, Harrell Gave, MD  colchicine 0.6 MG tablet Take 1 tablet (0.6 mg total) by mouth 2 (two) times daily. 10/14/20  Yes Glean Hess, MD  nitroGLYCERIN (NITROSTAT) 0.4 MG SL tablet Place 1 tablet (0.4 mg total) under the tongue every 5 (five) minutes as needed for chest pain. 08/08/15  Yes Kilroy, Luke  K, PA-C  promethazine-dextromethorphan (PROMETHAZINE-DM) 6.25-15 MG/5ML syrup Take 5 mLs by mouth 4 (four) times daily as needed for cough. 02/26/21  Yes Glean Hess, MD  sacubitril-valsartan (ENTRESTO) 49-51 MG Take 1 tablet by mouth 2 (two) times daily. Need to make an appointment for further refills 01/13/21 01/08/22 Yes End, Harrell Gave, MD  traZODone (DESYREL) 50 MG tablet TAKE 1 TABLET BY MOUTH AT BEDTIME AS NEEDED FOR SLEEP. 01/30/20  Yes Dahlia Byes, MD    Family History Family History  Problem Relation Age of Onset   Heart attack Mother    Coronary artery disease Mother    Heart disease Mother        Carotid Stenosis and BPG and Heart Disease before age 66   Diabetes Mother    Hypertension Mother    Heart attack Father    Heart disease Father  BPG and Heart Disease before age 61   Hypertension Father    Cancer Father 19       throat   Stroke Father    Colon cancer Neg Hx    Colon polyps Neg Hx    Esophageal cancer Neg Hx    Rectal cancer Neg Hx    Stomach cancer Neg Hx     Social History Social History   Tobacco Use   Smoking status: Former    Packs/day: 1.25    Years: 35.00    Pack years: 43.75    Types: Cigarettes    Quit date: 02/28/2005    Years since quitting: 16.0   Smokeless tobacco: Current    Types: Snuff   Tobacco comments:    occaisionally  Vaping Use   Vaping Use: Never used  Substance Use Topics   Alcohol use: Yes    Alcohol/week: 8.0 - 10.0 standard drinks    Types: 8 - 10 Glasses of wine per week    Comment: weekly   Drug use: No     Allergies   Brilinta [ticagrelor], Chlorhexidine gluconate, Contrast media [iodinated contrast media], Statins, and Zetia [ezetimibe]   Review of Systems Review of Systems  Constitutional:  Negative for fever.  HENT:  Positive for congestion, hearing loss and postnasal drip. Negative for ear discharge and ear pain.        Decreased hearing L ear    Physical Exam Triage Vital Signs ED  Triage Vitals  Enc Vitals Group     BP 03/04/21 0817 114/83     Pulse Rate 03/04/21 0817 65     Resp 03/04/21 0817 18     Temp 03/04/21 0817 98.4 F (36.9 C)     Temp Source 03/04/21 0817 Oral     SpO2 03/04/21 0817 97 %     Weight 03/04/21 0816 175 lb (79.4 kg)     Height 03/04/21 0816 5\' 6"  (1.676 m)     Head Circumference --      Peak Flow --      Pain Score 03/04/21 0816 0     Pain Loc --      Pain Edu? --      Excl. in [place]? --    No data found.  Updated Vital Signs BP 114/83 (BP Location: Left Arm)    Pulse 65    Temp 98.4 F (36.9 C) (Oral)    Resp 18    Ht 5\' 6"  (1.676 m)    Wt 175 lb (79.4 kg)    SpO2 97%    BMI 28.25 kg/m   Visual Acuity Right Eye Distance:   Left Eye Distance:   Bilateral Distance:    Right Eye Near:   Left Eye Near:    Bilateral Near:     Physical Exam Vitals and nursing note reviewed.  Constitutional:      General: He is not in acute distress.    Appearance: He is not toxic-appearing.  HENT:     Right Ear: Ear canal and external ear normal.     Left Ear: External ear normal.     Ears:     Comments: R TM gray and dull. L Tm not visible due to cerumen impaction. After lavage, TM is healthy Eyes:     General: No scleral icterus.    Conjunctiva/sclera: Conjunctivae normal.  Pulmonary:     Effort: Pulmonary effort is normal.  Musculoskeletal:        General: Normal  range of motion.     Cervical back: Neck supple.  Skin:    General: Skin is warm and dry.  Neurological:     Mental Status: He is alert and oriented to person, place, and time.     Gait: Gait normal.  Psychiatric:        Mood and Affect: Mood normal.        Behavior: Behavior normal.        Thought Content: Thought content normal.        Judgment: Judgment normal.     UC Treatments / Results  Labs (all labs ordered are listed, but only abnormal results are displayed) Labs Reviewed - No data to display  EKG   Radiology No results  found.  Procedures Procedures (including critical care time)  Medications Ordered in UC Medications - No data to display  Initial Impression / Assessment and Plan / UC Course  I have reviewed the triage vital signs and the nursing notes. L cerumen impaction, resolved after lavage.  FU prn Final Clinical Impressions(s) / UC Diagnoses   Final diagnoses:  None   Discharge Instructions   None    ED Prescriptions   None    PDMP not reviewed this encounter.   Shelby Mattocks, [place] 03/04/21 [phone]

## 2021-03-11 ENCOUNTER — Other Ambulatory Visit: Payer: Self-pay | Admitting: Internal Medicine

## 2021-03-12 ENCOUNTER — Telehealth (INDEPENDENT_AMBULATORY_CARE_PROVIDER_SITE_OTHER): Payer: Self-pay

## 2021-03-12 ENCOUNTER — Telehealth: Payer: Self-pay | Admitting: Internal Medicine

## 2021-03-12 NOTE — Telephone Encounter (Signed)
Pt is s/p left lower extremity angiogram and PTCA, mechanical thrombectomy with Dr. Lucky Cowboy 02/16/21. Per d/c note: - Vascular surgery recommends Eliquis 5 mg PO bid at D/C  Pt calling to inquire about continuing Eliquis and which provider is going to send in refills.  Pt was given Rx on discharge for Eliquis 5 mg BID s/p vascular procedure.   Pt states that he has 45 days left on hand of Eliquis.  Pt has follow up with Dr. Lucky Cowboy scheduled 04/06/21.   Pt will discuss medication management with Dr. Lucky Cowboy.

## 2021-03-12 NOTE — Telephone Encounter (Signed)
Patient calling  Wants to clarify medication changes with eliquis  Please call to discuss

## 2021-03-12 NOTE — Telephone Encounter (Signed)
Patient called requesting for 90 supply for Eliquis 5 mg bid. Patient had angio on 02/15/21 with Dr Lucky Cowboy. Patient has upcoming appointment on 04/06/21. I spoke with Eulogio Ditch NP and she informed that we can continue refills for Eliquis. Prescription for 90 day supply with 3 refills will be sent on the day he comes in the office and he informed that has enough until next visit.

## 2021-03-18 ENCOUNTER — Encounter: Payer: Self-pay | Admitting: Internal Medicine

## 2021-03-18 ENCOUNTER — Other Ambulatory Visit: Payer: Self-pay

## 2021-03-18 ENCOUNTER — Ambulatory Visit (INDEPENDENT_AMBULATORY_CARE_PROVIDER_SITE_OTHER): Payer: PPO | Admitting: Internal Medicine

## 2021-03-18 VITALS — BP 124/76 | HR 61 | Ht 66.0 in | Wt 184.0 lb

## 2021-03-18 DIAGNOSIS — I739 Peripheral vascular disease, unspecified: Secondary | ICD-10-CM | POA: Diagnosis not present

## 2021-03-18 DIAGNOSIS — Z0000 Encounter for general adult medical examination without abnormal findings: Secondary | ICD-10-CM

## 2021-03-18 DIAGNOSIS — I25118 Atherosclerotic heart disease of native coronary artery with other forms of angina pectoris: Secondary | ICD-10-CM

## 2021-03-18 DIAGNOSIS — M109 Gout, unspecified: Secondary | ICD-10-CM | POA: Diagnosis not present

## 2021-03-18 DIAGNOSIS — Z125 Encounter for screening for malignant neoplasm of prostate: Secondary | ICD-10-CM | POA: Diagnosis not present

## 2021-03-18 DIAGNOSIS — I9789 Other postprocedural complications and disorders of the circulatory system, not elsewhere classified: Secondary | ICD-10-CM

## 2021-03-18 DIAGNOSIS — I4891 Unspecified atrial fibrillation: Secondary | ICD-10-CM | POA: Diagnosis not present

## 2021-03-18 DIAGNOSIS — D6869 Other thrombophilia: Secondary | ICD-10-CM | POA: Diagnosis not present

## 2021-03-18 DIAGNOSIS — I5032 Chronic diastolic (congestive) heart failure: Secondary | ICD-10-CM

## 2021-03-18 DIAGNOSIS — F5101 Primary insomnia: Secondary | ICD-10-CM

## 2021-03-18 MED ORDER — COLCHICINE 0.6 MG PO TABS: 0.6000 mg | ORAL_TABLET | Freq: Two times a day (BID) | ORAL | 0 refills | Status: DC

## 2021-03-18 MED ORDER — TRAZODONE HCL 50 MG PO TABS: 50.0000 mg | ORAL_TABLET | Freq: Every evening | ORAL | 1 refills | Status: DC | PRN

## 2021-03-18 NOTE — Progress Notes (Signed)
Date:  03/18/2021   Name:  [name]   DOB:  12/14/1950   MRN:  [id number]   Chief Complaint: Annual Exam [name] is a 71 y.o. male who presents today for his Complete Annual Exam. He feels well. He reports exercising walking when he can but cant walk far without stopping due to hip pain. He reports he is sleeping well. He is recovering well from Covid infection three weeks ago.  He also had a LLE revascularization procedure in December - stent replaced and restarted on Eliquis.   He is very limited in the distance he can walk without fatigue and left hip pain.  He still walks when he can and plays golf.  Colonoscopy: 7.2020  Immunization History  Administered Date(s) Administered   Fluad Quad(high Dose 65+) 11/23/2018   Influenza, High Dose Seasonal PF 12/23/2016, 12/12/2017, 12/21/2020   Influenza-Unspecified 12/15/2015, 12/23/2016   PFIZER Comirnaty(Gray Top)Covid-19 Tri-Sucrose Vaccine 04/09/2019, 04/30/2019, 12/02/2019   PFIZER(Purple Top)SARS-COV-2 Vaccination 04/09/2019, 04/30/2019   Pneumococcal Conjugate-13 06/15/2015   Pneumococcal Polysaccharide-23 01/06/2017   Tdap 04/18/2012   Zoster Recombinat (Shingrix) 01/17/2020, 04/22/2020    Lab Results  Component Value Date   PSA1 0.9 05/21/2019   PSA1 0.6 05/17/2018   PSA 0.83 06/15/2015    HPI  Lab Results  Component Value Date   NA 136 02/16/2021   K 4.6 02/16/2021   CO2 22 02/16/2021   GLUCOSE 138 (H) 02/16/2021   BUN 24 (H) 02/16/2021   CREATININE 0.81 02/16/2021   CALCIUM 8.8 (L) 02/16/2021   EGFR 93 11/18/2020   GFRNONAA >60 02/16/2021   Lab Results  Component Value Date   CHOL 144 01/07/2020   HDL 62 01/07/2020   LDLCALC 50 01/07/2020   TRIG 158 (H) 01/07/2020   CHOLHDL 2.3 01/07/2020   Lab Results  Component Value Date   TSH 3.260 07/24/2020   Lab Results  Component Value Date   HGBA1C 5.6 11/07/2019   Lab Results  Component Value Date   WBC 7.4 02/16/2021   HGB 13.3 02/16/2021    HCT 40.3 02/16/2021   MCV 91.2 02/16/2021   PLT 167 02/16/2021   Lab Results  Component Value Date   ALT 25 02/15/2021   AST 23 02/15/2021   ALKPHOS 80 02/15/2021   BILITOT 0.9 02/15/2021   Lab Results  Component Value Date   VD25OH 42.7 09/06/2017     Review of Systems  Constitutional:  Positive for fatigue and unexpected weight change (gained some weight last year). Negative for chills and fever.  HENT:  Negative for ear pain, sore throat, trouble swallowing and voice change.   Eyes:  Negative for visual disturbance.  Respiratory:  Positive for shortness of breath. Negative for cough, chest tightness and wheezing.   Cardiovascular:  Negative for chest pain, palpitations and leg swelling.  Gastrointestinal:  Negative for abdominal pain, constipation and diarrhea.  Genitourinary:  Negative for frequency, hematuria and urgency.  Musculoskeletal:  Positive for arthralgias (left hip) and gait problem.  Skin:  Negative for rash.  Allergic/Immunologic: Negative for environmental allergies.  Neurological:  Negative for dizziness and headaches.  Psychiatric/Behavioral:  Positive for sleep disturbance. Negative for dysphoric mood. The patient is not nervous/anxious.    Patient Active Problem List   Diagnosis Date Noted   Arterial occlusion    Ischemia of left lower extremity 02/15/2021   HLD (hyperlipidemia) 02/15/2021   CAD (coronary artery disease) 02/15/2021   Chronic diastolic CHF (congestive heart failure) (  [place]) 02/15/2021   Alcohol use 02/15/2021   AKI (acute kidney injury) ([place]) 11/20/2020   Gouty arthritis of great toe 10/14/2020   Ischemia of extremity 09/28/2020   Postoperative atrial fibrillation ([place]) 05/21/2020   Statin myopathy 05/05/2020   Acquired thrombophilia ([place]) 01/08/2020   Post-operative state 12/18/2019   Postop check 11/27/2019   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   Accelerating angina ([place]) 11/05/2019   Left bundle branch block 11/05/2019   Shortness of  breath    Stricture and stenosis of esophagus    Polyp of colon    Rectal polyp    CAD, multiple vessel 08/13/2018   PAD (peripheral artery disease) ([place]) 08/13/2018   Carotid stenosis 07/17/2018   Atherosclerosis of artery of extremity with rest pain ([place]) 02/63/7858   Chronic systolic heart failure ([place]) 03/12/2018   Nonischemic cardiomyopathy ([place]) 12/08/2017   Myalgia due to statin 12/08/2017   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 ([place]) 05/01/2017   Foot pain, bilateral 05/01/2017   Degenerative disc disease, 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   History of tobacco use 07/13/2016   Overweight (BMI 25.0-29.9) 07/13/2016   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[place]) 07/13/2016   Dysphagia 11/26/2015   Chronic anticoagulation: plavix 10/15/2015   Chronic left hip pain 09/15/2015   Essential hypertension 08/08/2015   Coronary artery disease of native artery of native heart with stable angina pectoris ([place])    Mixed hyperlipidemia 01/29/2013   History of CEA (carotid endarterectomy) 11/08/2011    Allergies  Allergen Reactions   Brilinta [Ticagrelor] Shortness Of Breath   Chlorhexidine Gluconate Other (See Comments)    Skin burning for hours afterward   Contrast Media [Iodinated Contrast Media] Itching    Face and head flushing, nose itching after contrast administation for angiogram   Statins Other (See Comments)    Failed Crestor 5 mg twice weekly, Crestor 20 mg daily, Pravastatin 40 mg qd, Lipitor, Zocor - muscle aches   Zetia [Ezetimibe] Other (See Comments)    Muscle aches    Past Surgical History:  Procedure Laterality Date   APPENDECTOMY     BACK SURGERY     BIV ICD INSERTION CRT-D N/A 03/23/2020   Procedure: BIV ICD INSERTION CRT-D;  Surgeon: Vickie Epley, MD;  Location: [facility];  Service: Cardiovascular;  Laterality: N/A;   CARDIAC CATHETERIZATION N/A 08/07/2015   Procedure: Left Heart Cath and Coronary Angiography;  Surgeon: [name], MD;  Location: [facility];  Service: Cardiovascular;  Laterality: N/A;   CARDIAC CATHETERIZATION N/A 08/07/2015   Procedure: Coronary Stent Intervention;  Surgeon: [name], MD;  Location: [facility];  Service: Cardiovascular;  Laterality: N/A;   CARDIAC CATHETERIZATION N/A 08/07/2015   Procedure: Coronary Stent Intervention;  Surgeon: [name], MD;  Location: [facility];  Service: Cardiovascular;  Laterality: N/A;   CAROTID ENDARTERECTOMY  01/05/2006   Right  CEA with DPA   CATARACT EXTRACTION W/ INTRAOCULAR LENS IMPLANT Left 12/04/2017   CATARACT EXTRACTION W/PHACO Left 12/04/2017   Procedure: CATARACT EXTRACTION PHACO AND INTRAOCULAR LENS PLACEMENT ([place]) LEFT;  Surgeon: Eulogio Bear, MD;  Location: [facility];  Service: Ophthalmology;  Laterality: Left;   CATARACT EXTRACTION W/PHACO Right 02/06/2018   Procedure: CATARACT EXTRACTION PHACO AND INTRAOCULAR LENS PLACEMENT (I[facility])RIGHT;  Surgeon: Eulogio Bear, MD;  Location: [facility];  Service: Ophthalmology;  Laterality: Right;   COLONOSCOPY  05/20/2008   COLONOSCOPY WITH PROPOFOL N/A 09/13/2018   Procedure: COLONOSCOPY  WITH BIOPSY;  Surgeon: [name], MD;  Location: [facility];  Service: Endoscopy;  Laterality: N/A;   CORONARY ARTERY BYPASS GRAFT N/A 11/11/2019   Procedure: CORONARY ARTERY BYPASS GRAFTING (CABG) USING LIMA to Diag1; ENDOSCOPICALLY HARVESTED RIGHT GREATER SAPHENOUS VEIN: SVG to OM1; SVG to OM2; SVG to PDA.;  Surgeon: Ivin Poot, MD;  Location: [facility];  Service: Open Heart Surgery;  Laterality: N/A;   CORONARY STENT PLACEMENT  08/07/2015   MID CIRCUMFLEX   ENDARTERECTOMY FEMORAL Bilateral 09/30/2020   Procedure: ENDARTERECTOMY FEMORAL;  Surgeon: Algernon Huxley, MD;  Location: [facility];  Service: Vascular;  Laterality: Bilateral;   ENDOVEIN HARVEST OF GREATER SAPHENOUS VEIN Right 11/11/2019   Procedure: ENDOVEIN HARVEST OF GREATER SAPHENOUS VEIN;  Surgeon: Ivin Poot, MD;   Location: [facility];  Service: Open Heart Surgery;  Laterality: Right;   ESOPHAGOGASTRODUODENOSCOPY (EGD) WITH PROPOFOL N/A 02/11/2019   Procedure: ESOPHAGOGASTRODUODENOSCOPY (EGD) WITH BIOPSY and  Dilation;  Surgeon: [name], MD;  Location: [facility];  Service: Endoscopy;  Laterality: N/A;   HIP SURGERY Left 10/2016   left hip tendon repair   LEFT HEART CATH AND CORONARY ANGIOGRAPHY N/A 11/27/2017   Procedure: LEFT HEART CATH AND CORONARY ANGIOGRAPHY;  Surgeon: Wellington Hampshire, MD;  Location: [facility];  Service: Cardiovascular;  Laterality: N/A;   LOWER EXTREMITY ANGIOGRAPHY Left 02/12/2018   Procedure: LOWER EXTREMITY ANGIOGRAPHY;  Surgeon: Algernon Huxley, MD;  Location: [facility];  Service: Cardiovascular;  Laterality: Left;   LOWER EXTREMITY ANGIOGRAPHY Left 03/07/2018   Procedure: LOWER EXTREMITY ANGIOGRAPHY;  Surgeon: Algernon Huxley, MD;  Location: [facility];  Service: Cardiovascular;  Laterality: Left;   LOWER EXTREMITY ANGIOGRAPHY Left 06/04/2018   Procedure: LOWER EXTREMITY ANGIOGRAPHY;  Surgeon: Algernon Huxley, MD;  Location: [facility];  Service: Cardiovascular;  Laterality: Left;   LOWER EXTREMITY ANGIOGRAPHY Left 09/17/2020   Procedure: LOWER EXTREMITY ANGIOGRAPHY;  Surgeon: Algernon Huxley, MD;  Location: [facility];  Service: Cardiovascular;  Laterality: Left;   LOWER EXTREMITY ANGIOGRAPHY Left 09/28/2020   Procedure: LOWER EXTREMITY ANGIOGRAPHY;  Surgeon: Algernon Huxley, MD;  Location: [facility];  Service: Cardiovascular;  Laterality: Left;   LOWER EXTREMITY ANGIOGRAPHY N/A 02/15/2021   Procedure: LOWER EXTREMITY ANGIOGRAPHY;  Surgeon: Algernon Huxley, MD;  Location: [facility];  Service: Cardiovascular;  Laterality: N/A;   PLACEMENT OF IMPELLA LEFT VENTRICULAR ASSIST DEVICE N/A 11/11/2019   Procedure: PLACEMENT OF IMPELLA LEFT VENTRICULAR ASSIST DEVICE 5.5;  Surgeon: Ivin Poot, MD;  Location: [facility];  Service:  Open Heart Surgery;  Laterality: N/A;  Midline Sternotomy   POLYPECTOMY N/A 09/13/2018   Procedure: POLYPECTOMY;  Surgeon: [name], MD;  Location: [facility];  Service: Endoscopy;  Laterality: N/A;   POLYPECTOMY N/A 02/11/2019   Procedure: POLYPECTOMY;  Surgeon: [name], MD;  Location: [facility];  Service: Endoscopy;  Laterality: N/A;   REMOVAL OF IMPELLA LEFT VENTRICULAR ASSIST DEVICE N/A 11/15/2019   Procedure: REMOVAL OF IMPELLA 5.5 LEFT VENTRICULAR ASSIST DEVICE;  Surgeon: Ivin Poot, MD;  Location: [facility];  Service: Open Heart Surgery;  Laterality: N/A;   RIGHT/LEFT HEART CATH AND CORONARY ANGIOGRAPHY N/A 11/05/2019   Procedure: RIGHT/LEFT HEART CATH AND CORONARY ANGIOGRAPHY;  Surgeon: Nelva Bush, MD;  Location: [facility];  Service: Cardiovascular;  Laterality: N/A;   SPINE SURGERY     TEE WITHOUT CARDIOVERSION N/A 11/11/2019  Procedure: TRANSESOPHAGEAL ECHOCARDIOGRAM (TEE);  Surgeon: Prescott Gum, Collier Salina, MD;  Location: [facility];  Service: Open Heart Surgery;  Laterality: N/A;   TEE WITHOUT CARDIOVERSION N/A 11/15/2019   Procedure: TRANSESOPHAGEAL ECHOCARDIOGRAM (TEE);  Surgeon: Prescott Gum, Collier Salina, MD;  Location: [facility];  Service: Open Heart Surgery;  Laterality: N/A;   TONSILLECTOMY      Social History   Tobacco Use   Smoking status: Former    Packs/day: 1.25    Years: 35.00    Pack years: 43.75    Types: Cigarettes    Quit date: 02/28/2005    Years since quitting: 16.0   Smokeless tobacco: Current    Types: Snuff   Tobacco comments:    occaisionally  Vaping Use   Vaping Use: Never used  Substance Use Topics   Alcohol use: Yes    Alcohol/week: 8.0 - 10.0 standard drinks    Types: 8 - 10 Glasses of wine per week    Comment: weekly   Drug use: No     Medication list has been reviewed and updated.  Current Meds  Medication Sig   Alirocumab (PRALUENT) 150 MG/ML SOAJ Inject 1 pen into the skin every 14 (fourteen) days.   apixaban  (ELIQUIS) 5 MG TABS tablet Take 1 tablet (5 mg total) by mouth 2 (two) times daily.   aspirin EC 81 MG tablet Take 1 tablet (81 mg total) by mouth daily. (Patient taking differently: Take 81 mg by mouth at bedtime.)   carvedilol (COREG) 12.5 MG tablet TAKE 1 TABLET BY MOUTH 2 TIMES DAILY.   nitroGLYCERIN (NITROSTAT) 0.4 MG SL tablet Place 1 tablet (0.4 mg total) under the tongue every 5 (five) minutes as needed for chest pain.   sacubitril-valsartan (ENTRESTO) 49-51 MG Take 1 tablet by mouth 2 (two) times daily. Need to make an appointment for further refills   [DISCONTINUED] colchicine 0.6 MG tablet Take 1 tablet (0.6 mg total) by mouth 2 (two) times daily. (Patient taking differently: Take 0.6 mg by mouth as needed.)   [DISCONTINUED] promethazine-dextromethorphan (PROMETHAZINE-DM) 6.25-15 MG/5ML syrup Take 5 mLs by mouth 4 (four) times daily as needed for cough.   [DISCONTINUED] traZODone (DESYREL) 50 MG tablet TAKE 1 TABLET BY MOUTH AT BEDTIME AS NEEDED FOR SLEEP.    PHQ 2/9 Scores 03/18/2021 02/24/2021 10/14/2020 09/23/2020  PHQ - 2 Score 0 2 2 0  PHQ- 9 Score 0 2 2 -    GAD 7 : Generalized Anxiety Score 03/18/2021 02/24/2021 10/14/2020 08/26/2019  Nervous, Anxious, on Edge 0 0 0 0  Control/stop worrying 0 0 0 0  Worry too much - different things 0 0 0 0  Trouble relaxing 0 0 0 0  Restless 0 0 0 0  Easily annoyed or irritable 0 0 0 0  Afraid - awful might happen 0 0 0 0  Total GAD 7 Score 0 0 0 0  Anxiety Difficulty - Not difficult at all - Not difficult at all    BP Readings from Last 3 Encounters:  03/18/21 124/76  03/04/21 114/83  02/16/21 (!) 125/95    Physical Exam Vitals and nursing note reviewed.  Constitutional:      General: He is not in acute distress.    Appearance: Normal appearance. He is well-developed.  HENT:     Head: Normocephalic and atraumatic.     Right Ear: Tympanic membrane normal.     Left Ear: Tympanic membrane normal.  Cardiovascular:     Rate and  Rhythm: Normal rate  and regular rhythm.     Pulses: Normal pulses.     Heart sounds: No murmur heard. Pulmonary:     Effort: Pulmonary effort is normal. No respiratory distress.  Chest:       Comments: Pacemaker site intact Abdominal:     General: Abdomen is flat. Bowel sounds are normal.     Palpations: Abdomen is soft.     Tenderness: There is no abdominal tenderness.  Musculoskeletal:     Cervical back: Normal range of motion.  Lymphadenopathy:     Cervical: No cervical adenopathy.  Skin:    General: Skin is warm and dry.     Findings: No rash.  Neurological:     Mental Status: He is alert and oriented to person, place, and time.     Motor: Motor function is intact.     Coordination: Coordination is intact.     Deep Tendon Reflexes: Reflexes are normal and symmetric.  Psychiatric:        Mood and Affect: Mood normal.        Behavior: Behavior normal.    Wt Readings from Last 3 Encounters:  03/18/21 184 lb (83.5 kg)  03/04/21 175 lb (79.4 kg)  02/24/21 182 lb 9.6 oz (82.8 kg)    BP 124/76    Pulse 61    Ht $R'5\' 6"'iz$  (1.676 m)    Wt 184 lb (83.5 kg)    SpO2 95%    BMI 29.70 kg/m   Assessment and Plan: 1. Annual physical exam Normal exam Encourage exercise as able daily. Handicapped parking placard application given  2. Prostate cancer screening DRE deferred - PSA  3. Gouty arthritis of great toe He has occasional mild flares that respond to 1-2 colchicine tabs po. - colchicine 0.6 MG tablet; Take 1 tablet (0.6 mg total) by mouth 2 (two) times daily.  Dispense: 30 tablet; Refill: 0  4. Chronic diastolic CHF (congestive heart failure) ([place]) He feels well no on Entrestro. No PND or Orthopnea; BP has been stable.  5. PAD (peripheral artery disease) ([facility]) Recent clot in LLE stent - replaced and started on Eliquis indefinitely  6. Postoperative atrial fibrillation ([facility]) Resolved  7. Acquired thrombophilia ([place]) Due to Eliquis therapy. No bleeding issues.  8.  Coronary artery disease of native artery of native heart with stable angina pectoris [facility]) Currently doing well.  He has close follow up with Dr. Saunders Revel. No lipid panel in the past 12 months. Continue Praluent every 14 days. - Lipid panel  9. Primary insomnia Started after CABG in 2014. He uses it intermittently with good sleep and no morning somnolence. - traZODone (DESYREL) 50 MG tablet; Take 1 tablet (50 mg total) by mouth at bedtime as needed. for sleep  Dispense: 90 tablet; Refill: 1   Partially dictated using Editor, commissioning. Any errors are unintentional.  Halina Maidens, MD [facility]  03/18/2021

## 2021-03-19 LAB — PSA: Prostate Specific Ag, Serum: 0.5 ng/mL (ref 0.0–4.0)

## 2021-03-19 LAB — LIPID PANEL
Chol/HDL Ratio: 2 ratio (ref 0.0–5.0)
Cholesterol, Total: 127 mg/dL (ref 100–199)
HDL: 63 mg/dL (ref >39)
LDL Chol Calc (NIH): 44 mg/dL (ref 0–99)
Triglycerides: 111 mg/dL (ref 0–149)
VLDL Cholesterol Cal: 20 mg/dL (ref 5–40)

## 2021-03-23 ENCOUNTER — Ambulatory Visit (INDEPENDENT_AMBULATORY_CARE_PROVIDER_SITE_OTHER): Payer: PPO

## 2021-03-23 DIAGNOSIS — I428 Other cardiomyopathies: Secondary | ICD-10-CM

## 2021-03-23 LAB — CUP PACEART REMOTE DEVICE CHECK
Battery Remaining Longevity: 60 mo
Battery Remaining Percentage: 83 %
Battery Voltage: 2.98 V
Brady Statistic AP VP Percent: 55 %
Brady Statistic AP VS Percent: 1 %
Brady Statistic AS VP Percent: 27 %
Brady Statistic AS VS Percent: 3 %
Brady Statistic RA Percent Paced: 38 %
Date Time Interrogation Session: 20230124051127
HighPow Impedance: 56 Ohm
Implantable Lead Implant Date: 20220124
Implantable Lead Implant Date: 20220124
Implantable Lead Implant Date: 20220124
Implantable Lead Location: 753858
Implantable Lead Location: 753859
Implantable Lead Location: 753860
Implantable Pulse Generator Implant Date: 20220124
Lead Channel Impedance Value: 440 Ohm
Lead Channel Impedance Value: 440 Ohm
Lead Channel Impedance Value: 510 Ohm
Lead Channel Pacing Threshold Amplitude: 0.625 V
Lead Channel Pacing Threshold Amplitude: 0.75 V
Lead Channel Pacing Threshold Amplitude: 1.625 V
Lead Channel Pacing Threshold Pulse Width: 0.5 ms
Lead Channel Pacing Threshold Pulse Width: 0.5 ms
Lead Channel Pacing Threshold Pulse Width: 0.5 ms
Lead Channel Sensing Intrinsic Amplitude: 1.9 mV
Lead Channel Sensing Intrinsic Amplitude: 12 mV
Lead Channel Setting Pacing Amplitude: 1.625
Lead Channel Setting Pacing Amplitude: 2 V
Lead Channel Setting Pacing Amplitude: 2.125
Lead Channel Setting Pacing Pulse Width: 0.5 ms
Lead Channel Setting Pacing Pulse Width: 0.5 ms
Lead Channel Setting Sensing Sensitivity: 0.5 mV
Pulse Gen Serial Number: 810017138

## 2021-03-25 MED ORDER — METHYLPREDNISOLONE SODIUM SUCC 125 MG IJ SOLR
INTRAMUSCULAR | Status: AC
  Filled 2021-03-25: qty 2

## 2021-03-25 MED ORDER — DIPHENHYDRAMINE HCL 50 MG/ML IJ SOLN
INTRAMUSCULAR | Status: AC
  Filled 2021-03-25: qty 1

## 2021-03-27 ENCOUNTER — Other Ambulatory Visit: Payer: Self-pay | Admitting: Internal Medicine

## 2021-03-27 DIAGNOSIS — M109 Gout, unspecified: Secondary | ICD-10-CM

## 2021-03-27 NOTE — Telephone Encounter (Signed)
last RF 03/18/21 #30 too soon  Requested Prescriptions  Refused Prescriptions Disp Refills   colchicine 0.6 MG tablet [Pharmacy Med Name: COLCHICINE 0.6 MG TABLET] 30 tablet 0    Sig: TAKE 1 TABLET BY MOUTH 2 TIMES DAILY.     Endocrinology:  Gout Agents Failed - 03/27/2021 11:33 AM      Failed - Uric Acid in normal range and within 360 days    No results found for: POCURA, LABURIC       Passed - Cr in normal range and within 360 days    Creat  Date Value Ref Range Status  07/23/2015 1.12 0.70 - 1.25 mg/dL Final   Creatinine, Ser  Date Value Ref Range Status  02/16/2021 0.81 0.61 - 1.24 mg/dL Final         Passed - Valid encounter within last 12 months    Recent Outpatient Visits          1 week ago Annual physical exam   [facility] Glean Hess, MD   1 month ago COVID-19 virus infection   [place], [place]   5 months ago Gouty arthritis of great toe   [facility] Glean Hess, MD   1 year ago Impacted cerumen of left ear   [facility] Glean Hess, MD   1 year ago Annual physical exam   [facility] Glean Hess, MD      Future Appointments            In 1 month End, Harrell Gave, MD [facility], [facility]   In 12 months Army Melia, Jesse Sans, MD [facility], [facility]

## 2021-04-02 NOTE — Progress Notes (Signed)
Remote ICD transmission.   

## 2021-04-05 MED ORDER — PRALUENT 150 MG/ML ~~LOC~~ SOAJ: 1.0000 "pen " | SUBCUTANEOUS | 3 refills | Status: DC

## 2021-04-05 NOTE — Addendum Note (Signed)
Addended by: Valora Corporal on: 04/05/2021 03:06 PM   Modules accepted: Orders

## 2021-04-05 NOTE — Telephone Encounter (Signed)
Pt dropped off PAF paperwork to submit for 2023 for Praluent.  Will complete provider portion and have Dr. Saunders Revel sign then will send to to [employer]. Paperwork with this RN at this time.

## 2021-04-05 NOTE — Addendum Note (Signed)
Addended by: Darlyne Russian on: 04/05/2021 04:06 PM   Modules accepted: Orders

## 2021-04-06 ENCOUNTER — Ambulatory Visit (INDEPENDENT_AMBULATORY_CARE_PROVIDER_SITE_OTHER): Payer: PPO

## 2021-04-06 ENCOUNTER — Encounter (INDEPENDENT_AMBULATORY_CARE_PROVIDER_SITE_OTHER): Payer: Self-pay | Admitting: Vascular Surgery

## 2021-04-06 ENCOUNTER — Ambulatory Visit (INDEPENDENT_AMBULATORY_CARE_PROVIDER_SITE_OTHER): Payer: PPO | Admitting: Vascular Surgery

## 2021-04-06 ENCOUNTER — Other Ambulatory Visit: Payer: Self-pay

## 2021-04-06 VITALS — BP 119/76 | HR 66 | Resp 16 | Wt 186.6 lb

## 2021-04-06 DIAGNOSIS — I10 Essential (primary) hypertension: Secondary | ICD-10-CM

## 2021-04-06 DIAGNOSIS — I6523 Occlusion and stenosis of bilateral carotid arteries: Secondary | ICD-10-CM

## 2021-04-06 DIAGNOSIS — E785 Hyperlipidemia, unspecified: Secondary | ICD-10-CM | POA: Diagnosis not present

## 2021-04-06 DIAGNOSIS — I739 Peripheral vascular disease, unspecified: Secondary | ICD-10-CM | POA: Diagnosis not present

## 2021-04-06 DIAGNOSIS — I70229 Atherosclerosis of native arteries of extremities with rest pain, unspecified extremity: Secondary | ICD-10-CM | POA: Diagnosis not present

## 2021-04-06 NOTE — Assessment & Plan Note (Signed)
lipid control important in reducing the progression of atherosclerotic disease. Continue statin therapy  

## 2021-04-06 NOTE — Assessment & Plan Note (Signed)
Duplex today shows 1 to 39% right ICA stenosis and 40 to 59% left ICA stenosis without significant progression from previous studies.  No role for intervention at this level.  Continue to follow on annual basis.  Continue current medical regimen.

## 2021-04-06 NOTE — Assessment & Plan Note (Signed)
blood pressure control important in reducing the progression of atherosclerotic disease. On appropriate oral medications.  

## 2021-04-06 NOTE — Assessment & Plan Note (Signed)
ABIs today are normal at 1.24 on the right and 1.18 on the left with multiphasic waveforms and normal digital pressures and waveforms currently.  Multiple previous interventions and surgeries.  Doing well.  Continue current medical regimen including Eliquis.  He is having a hard time affording that, but this is clearly necessary as he has failed many times without full anticoagulation.  Recheck in 3 months.

## 2021-04-06 NOTE — Progress Notes (Signed)
MRN : [id number]  [name] is a 71 y.o. (1951/02/24) male who presents with chief complaint of  Chief Complaint  Patient presents with   Follow-up    Ultrasound follow up  .  History of Present Illness: Patient returns today in follow up of his peripheral arterial disease as well as his carotid disease.  He is doing well.  He still has some numbness in the right leg after femoral endarterectomy this a little over 6 months ago.  He had to have a repeat intervention on the left leg back in December.  He is doing well now.  He is playing golf regularly.  He is not having disabling claudication symptoms or rest pain.  ABIs today are normal at 1.24 on the right and 1.18 on the left with multiphasic waveforms and normal digital pressures and waveforms currently. He is also followed for carotid disease.  No focal neurologic symptoms. Specifically, the patient denies amaurosis fugax, speech or swallowing difficulties, or arm or leg weakness or numbness. Duplex today shows 1 to 39% right ICA stenosis and 40 to 59% left ICA stenosis without significant progression from previous studies.  Current Outpatient Medications  Medication Sig Dispense Refill   Alirocumab (PRALUENT) 150 MG/ML SOAJ Inject 1 pen into the skin every 14 (fourteen) days. 6 mL 3   apixaban (ELIQUIS) 5 MG TABS tablet Take 1 tablet (5 mg total) by mouth 2 (two) times daily. 60 tablet 0   aspirin EC 81 MG tablet Take 1 tablet (81 mg total) by mouth daily. (Patient taking differently: Take 81 mg by mouth at bedtime.) 150 tablet 2   carvedilol (COREG) 12.5 MG tablet TAKE 1 TABLET BY MOUTH 2 TIMES DAILY. 180 tablet 0   colchicine 0.6 MG tablet Take 1 tablet (0.6 mg total) by mouth 2 (two) times daily. 30 tablet 0   nitroGLYCERIN (NITROSTAT) 0.4 MG SL tablet Place 1 tablet (0.4 mg total) under the tongue every 5 (five) minutes as needed for chest pain. 25 tablet 2   sacubitril-valsartan (ENTRESTO) 49-51 MG Take 1 tablet by mouth 2 (two)  times daily. Need to make an appointment for further refills 180 tablet 3   traZODone (DESYREL) 50 MG tablet Take 1 tablet (50 mg total) by mouth at bedtime as needed. for sleep 90 tablet 1   No current facility-administered medications for this visit.    Past Medical History:  Diagnosis Date   Abnormal nuclear cardiac imaging test 08/08/2015   Arthritis    fingers   Carotid artery occlusion    CHF (congestive heart failure) (HCC)    Coronary atherosclerosis of native coronary artery 01/29/2013   11/05/19 R/LHC 80% dLMCA stenosis small diffusely dz dLAD, chronically occluded OM1 50%mRCA lesion, widely patent mLCx strent, moderately elevated L heart filling pressures, mild to moderate RH filling pressures, normal to moderately reduced CO   Duodenal erosion    Encounter for screening for lung cancer 07/13/2016   Esophageal stenosis    esophageal dilation   GERD (gastroesophageal reflux disease)    H. pylori infection    Heart attack ([place]) Oct. 2009   Mild   Hiatal hernia    Hyperlipidemia    Hypertension    Old myocardial infarction 11/29/2007   Mildly elevated troponin, isolated value in October 2009. Cardiac catheterization-nonobstructive 60% RCA disease-subsequent nuclear stress test-9 minutes, low risk, mild inferior wall hypokinesis    Pain in limb 12/19/2017   Peripheral vascular disease ([place])    Unstable  angina ([place]) 11/25/2017    Past Surgical History:  Procedure Laterality Date   APPENDECTOMY     BACK SURGERY     BIV ICD INSERTION CRT-D N/A 03/23/2020   Procedure: BIV ICD INSERTION CRT-D;  Surgeon: Vickie Epley, MD;  Location: [facility] [facility];  Service: Cardiovascular;  Laterality: N/A;   CARDIAC CATHETERIZATION N/A 08/07/2015   Procedure: Left Heart Cath and Coronary Angiography;  Surgeon: [name], MD;  Location: [facility];  Service: Cardiovascular;  Laterality: N/A;   CARDIAC CATHETERIZATION N/A 08/07/2015   Procedure: Coronary Stent Intervention;   Surgeon: [name], MD;  Location: [facility];  Service: Cardiovascular;  Laterality: N/A;   CARDIAC CATHETERIZATION N/A 08/07/2015   Procedure: Coronary Stent Intervention;  Surgeon: [name], MD;  Location: [facility];  Service: Cardiovascular;  Laterality: N/A;   CAROTID ENDARTERECTOMY  01/05/2006   Right  CEA with DPA   CATARACT EXTRACTION W/ INTRAOCULAR LENS IMPLANT Left 12/04/2017   CATARACT EXTRACTION W/PHACO Left 12/04/2017   Procedure: CATARACT EXTRACTION PHACO AND INTRAOCULAR LENS PLACEMENT ([place]) LEFT;  Surgeon: Eulogio Bear, MD;  Location: [place] [facility];  Service: Ophthalmology;  Laterality: Left;   CATARACT EXTRACTION W/PHACO Right 02/06/2018   Procedure: CATARACT EXTRACTION PHACO AND INTRAOCULAR LENS PLACEMENT (I[facility])RIGHT;  Surgeon: Eulogio Bear, MD;  Location: [facility];  Service: Ophthalmology;  Laterality: Right;   COLONOSCOPY  05/20/2008   COLONOSCOPY WITH PROPOFOL N/A 09/13/2018   Procedure: COLONOSCOPY WITH BIOPSY;  Surgeon: [name], MD;  Location: [facility];  Service: Endoscopy;  Laterality: N/A;   CORONARY ARTERY BYPASS GRAFT N/A 11/11/2019   Procedure: CORONARY ARTERY BYPASS GRAFTING (CABG) USING LIMA to Diag1; ENDOSCOPICALLY HARVESTED RIGHT GREATER SAPHENOUS VEIN: SVG to OM1; SVG to OM2; SVG to PDA.;  Surgeon: Ivin Poot, MD;  Location: [facility];  Service: Open Heart Surgery;  Laterality: N/A;   CORONARY STENT PLACEMENT  08/07/2015   MID CIRCUMFLEX   ENDARTERECTOMY FEMORAL Bilateral 09/30/2020   Procedure: ENDARTERECTOMY FEMORAL;  Surgeon: Algernon Huxley, MD;  Location: [facility];  Service: Vascular;  Laterality: Bilateral;   ENDOVEIN HARVEST OF GREATER SAPHENOUS VEIN Right 11/11/2019   Procedure: ENDOVEIN HARVEST OF GREATER SAPHENOUS VEIN;  Surgeon: Ivin Poot, MD;  Location: [place];  Service: Open Heart Surgery;  Laterality: Right;   ESOPHAGOGASTRODUODENOSCOPY (EGD) WITH PROPOFOL N/A 02/11/2019    Procedure: ESOPHAGOGASTRODUODENOSCOPY (EGD) WITH BIOPSY and  Dilation;  Surgeon: [name], MD;  Location: [facility];  Service: Endoscopy;  Laterality: N/A;   HIP SURGERY Left 10/2016   left hip tendon repair   LEFT HEART CATH AND CORONARY ANGIOGRAPHY N/A 11/27/2017   Procedure: LEFT HEART CATH AND CORONARY ANGIOGRAPHY;  Surgeon: Wellington [place], MD;  Location: [facility];  Service: Cardiovascular;  Laterality: N/A;   LOWER EXTREMITY ANGIOGRAPHY Left 02/12/2018   Procedure: LOWER EXTREMITY ANGIOGRAPHY;  Surgeon: Algernon Huxley, MD;  Location: [facility];  Service: Cardiovascular;  Laterality: Left;   LOWER EXTREMITY ANGIOGRAPHY Left 03/07/2018   Procedure: LOWER EXTREMITY ANGIOGRAPHY;  Surgeon: Algernon Huxley, MD;  Location: [facility];  Service: Cardiovascular;  Laterality: Left;   LOWER EXTREMITY ANGIOGRAPHY Left 06/04/2018   Procedure: LOWER EXTREMITY ANGIOGRAPHY;  Surgeon: Algernon Huxley, MD;  Location: [facility];  Service: Cardiovascular;  Laterality: Left;   LOWER EXTREMITY ANGIOGRAPHY Left 09/17/2020   Procedure: LOWER EXTREMITY ANGIOGRAPHY;  Surgeon: Lucky Cowboy,  Erskine Squibb, MD;  Location: [place] [facility];  Service: Cardiovascular;  Laterality: Left;   LOWER EXTREMITY ANGIOGRAPHY Left 09/28/2020   Procedure: LOWER EXTREMITY ANGIOGRAPHY;  Surgeon: Algernon Huxley, MD;  Location: [facility] [facility];  Service: Cardiovascular;  Laterality: Left;   LOWER EXTREMITY ANGIOGRAPHY N/A 02/15/2021   Procedure: LOWER EXTREMITY ANGIOGRAPHY;  Surgeon: Algernon Huxley, MD;  Location: [facility] [facility];  Service: Cardiovascular;  Laterality: N/A;   PLACEMENT OF IMPELLA LEFT VENTRICULAR ASSIST DEVICE N/A 11/11/2019   Procedure: PLACEMENT OF IMPELLA LEFT VENTRICULAR ASSIST DEVICE 5.5;  Surgeon: Ivin Poot, MD;  Location: [place];  Service: Open Heart Surgery;  Laterality: N/A;  Midline Sternotomy   POLYPECTOMY N/A 09/13/2018   Procedure: POLYPECTOMY;  Surgeon: [name], MD;  Location: [facility];  Service: Endoscopy;  Laterality: N/A;   POLYPECTOMY N/A 02/11/2019   Procedure: POLYPECTOMY;  Surgeon: [name], MD;  Location: [place];  Service: Endoscopy;  Laterality: N/A;   REMOVAL OF IMPELLA LEFT VENTRICULAR ASSIST DEVICE N/A 11/15/2019   Procedure: REMOVAL OF IMPELLA 5.5 LEFT VENTRICULAR ASSIST DEVICE;  Surgeon: Ivin Poot, MD;  Location: [place];  Service: Open Heart Surgery;  Laterality: N/A;   RIGHT/LEFT HEART CATH AND CORONARY ANGIOGRAPHY N/A 11/05/2019   Procedure: RIGHT/LEFT HEART CATH AND CORONARY ANGIOGRAPHY;  Surgeon: Nelva Bush, MD;  Location: [place] [facility];  Service: Cardiovascular;  Laterality: N/A;   SPINE SURGERY     TEE WITHOUT CARDIOVERSION N/A 11/11/2019   Procedure: TRANSESOPHAGEAL ECHOCARDIOGRAM (TEE);  Surgeon: Prescott Gum, Collier Salina, MD;  Location: [place];  Service: Open Heart Surgery;  Laterality: N/A;   TEE WITHOUT CARDIOVERSION N/A 11/15/2019   Procedure: TRANSESOPHAGEAL ECHOCARDIOGRAM (TEE);  Surgeon: Prescott Gum, Collier Salina, MD;  Location: [place];  Service: Open Heart Surgery;  Laterality: N/A;   TONSILLECTOMY       Social History   Tobacco Use   Smoking status: Former    Packs/day: 1.25    Years: 35.00    Pack years: 43.75    Types: Cigarettes    Quit date: 02/28/2005    Years since quitting: 16.1   Smokeless tobacco: Current    Types: Snuff   Tobacco comments:    occaisionally  Vaping Use   Vaping Use: Never used  Substance Use Topics   Alcohol use: Yes    Alcohol/week: 8.0 - 10.0 standard drinks    Types: 8 - 10 Glasses of wine per week    Comment: weekly   Drug use: No       Family History  Problem Relation Age of Onset   Heart attack Mother    Coronary artery disease Mother    Heart disease Mother        Carotid Stenosis and BPG and Heart Disease before age 63   Diabetes Mother    Hypertension Mother    Heart attack Father    Heart disease Father        BPG and Heart  Disease before age 69   Hypertension Father    Cancer Father 36       throat   Stroke Father    Colon cancer Neg Hx    Colon polyps Neg Hx    Esophageal cancer Neg Hx    Rectal cancer Neg Hx    Stomach cancer Neg Hx      Allergies  Allergen Reactions   Brilinta [Ticagrelor] Shortness Of Breath   Chlorhexidine Gluconate Other (See Comments)  Skin burning for hours afterward   Contrast Media [Iodinated Contrast Media] Itching    Face and head flushing, nose itching after contrast administation for angiogram   Statins Other (See Comments)    Failed Crestor 5 mg twice weekly, Crestor 20 mg daily, Pravastatin 40 mg qd, Lipitor, Zocor - muscle aches   Zetia [Ezetimibe] Other (See Comments)    Muscle aches     REVIEW OF SYSTEMS (Negative unless checked)  Constitutional: [] Weight loss  [] Fever  [] Chills Cardiac: [] Chest pain   [] Chest pressure   [] Palpitations   [] Shortness of breath when laying flat   [] Shortness of breath at rest   [] Shortness of breath with exertion. Vascular:  [x] Pain in legs with walking   [] Pain in legs at rest   [] Pain in legs when laying flat   [] Claudication   [] Pain in feet when walking  [] Pain in feet at rest  [] Pain in feet when laying flat   [] History of DVT   [] Phlebitis   [] Swelling in legs   [] Varicose veins   [] Non-healing ulcers Pulmonary:   [] Uses home oxygen   [] Productive cough   [] Hemoptysis   [] Wheeze  [] COPD   [] Asthma Neurologic:  [] Dizziness  [] Blackouts   [] Seizures   [] History of stroke   [] History of TIA  [] Aphasia   [] Temporary blindness   [] Dysphagia   [] Weakness or numbness in arms   [x] Weakness or numbness in legs Musculoskeletal:  [x] Arthritis   [] Joint swelling   [] Joint pain   [] Low back pain Hematologic:  [] Easy bruising  [] Easy bleeding   [] Hypercoagulable state   [] Anemic   Gastrointestinal:  [] Blood in stool   [] Vomiting blood  [] Gastroesophageal reflux/heartburn   [] Abdominal pain Genitourinary:  [] Chronic kidney disease    [] Difficult urination  [] Frequent urination  [] Burning with urination   [] Hematuria Skin:  [] Rashes   [] Ulcers   [] Wounds Psychological:  [] History of anxiety   []  History of major depression.  Physical Examination  BP 119/76 (BP Location: Right Arm)    Pulse 66    Resp 16    Wt 186 lb 9.6 oz (84.6 kg)    BMI 30.12 kg/m  Gen:  WD/WN, NAD Head: [place]/AT, No temporalis wasting. Ear/Nose/Throat: Hearing grossly intact, nares w/o erythema or drainage Eyes: Conjunctiva clear. Sclera non-icteric Neck: Supple.  Trachea midline Pulmonary:  Good air movement, no use of accessory muscles.  Cardiac: RRR, no JVD Vascular:  Vessel Right Left  Radial Palpable Palpable                          PT Palpable Palpable  DP Palpable Palpable   Musculoskeletal: M/S 5/5 throughout.  No deformity or atrophy. Trace LE edema. Neurologic: Sensation grossly intact in extremities.  Symmetrical.  Speech is fluent.  Psychiatric: Judgment intact, Mood & affect appropriate for pt's clinical situation. Dermatologic: No rashes or ulcers noted.  No cellulitis or open wounds.      Labs Recent Results (from the past 2160 hour(s))  CBC     Status: None   Collection Time: 02/15/21 10:04 AM  Result Value Ref Range   WBC 4.1 4.0 - 10.5 K/uL   RBC 4.80 4.22 - 5.81 MIL/uL   Hemoglobin 14.3 13.0 - 17.0 g/dL   HCT 44.6 39.0 - 52.0 %   MCV 92.9 80.0 - 100.0 fL   MCH 29.8 26.0 - 34.0 pg   MCHC 32.1 30.0 - 36.0 g/dL   RDW 14.9 11.5 -  15.5 %   Platelets 153 150 - 400 K/uL   nRBC 0.0 0.0 - 0.2 %    Comment: Performed at [facility], [address] [place], [place] [address]  Comprehensive metabolic panel     Status: Abnormal   Collection Time: 02/15/21 10:04 AM  Result Value Ref Range   Sodium 137 135 - 145 mmol/L   Potassium 4.6 3.5 - 5.1 mmol/L   Chloride 107 98 - 111 mmol/L   CO2 26 22 - 32 mmol/L   Glucose, Bld 102 (H) 70 - 99 mg/dL    Comment: Glucose reference range applies only to samples  taken after fasting for at least 8 hours.   BUN 26 (H) 8 - 23 mg/dL   Creatinine, Ser 0.98 0.61 - 1.24 mg/dL   Calcium 8.9 8.9 - 10.3 mg/dL   Total Protein 7.4 6.5 - 8.1 g/dL   Albumin 4.2 3.5 - 5.0 g/dL   AST 23 15 - 41 U/L   ALT 25 0 - 44 U/L   Alkaline Phosphatase 80 38 - 126 U/L   Total Bilirubin 0.9 0.3 - 1.2 mg/dL   GFR, Estimated >60 >60 mL/min    Comment: (NOTE) Calculated using the CKD-EPI Creatinine Equation (2021)    Anion gap 4 (L) 5 - 15    Comment: Performed at [facility], [address] [place], [place] [address]  Protime-INR     Status: None   Collection Time: 02/15/21 10:04 AM  Result Value Ref Range   Prothrombin Time 12.6 11.4 - 15.2 seconds   INR 0.9 0.8 - 1.2    Comment: (NOTE) INR goal varies based on device and disease states. Performed at [facility], [address] [place], [place] [address]   APTT     Status: None   Collection Time: 02/15/21 10:04 AM  Result Value Ref Range   aPTT 28 24 - 36 seconds    Comment: Performed at [facility], [address] [place], [place] [address]  Resp Panel by RT-PCR (Flu A&B, Covid) Nasopharyngeal Swab     Status: None   Collection Time: 02/15/21 10:04 AM   Specimen: Nasopharyngeal Swab; Nasopharyngeal(NP) swabs in vial transport medium  Result Value Ref Range   SARS Coronavirus 2 by RT PCR NEGATIVE NEGATIVE    Comment: (NOTE) SARS-CoV-2 target nucleic acids are NOT DETECTED.  The SARS-CoV-2 RNA is generally detectable in upper respiratory specimens during the acute phase of infection. The lowest concentration of SARS-CoV-2 viral copies this assay can detect is 138 copies/mL. A negative result does not preclude SARS-Cov-2 infection and should not be used as the sole basis for treatment or other patient management decisions. A negative result may occur with  improper specimen collection/handling, submission of specimen other than nasopharyngeal swab, presence of viral  mutation(s) within the areas targeted by this assay, and inadequate number of viral copies(<138 copies/mL). A negative result must be combined with clinical observations, patient history, and epidemiological information. The expected result is Negative.  Fact Sheet for Patients:  EntrepreneurPulse.com.au  Fact Sheet for Healthcare Providers:  IncredibleEmployment.be  This test is no t yet approved or cleared by the [place] FDA and  has been authorized for detection and/or diagnosis of SARS-CoV-2 by FDA under an Emergency Use Authorization (EUA). This EUA will remain  in effect (meaning this test can be used) for the duration of the COVID-19 declaration under Section 564(b)(1) of the Act, 21 U.S.C.section 360bbb-3(b)(1), unless the authorization is  terminated  or revoked sooner.       Influenza A by PCR NEGATIVE NEGATIVE   Influenza B by PCR NEGATIVE NEGATIVE    Comment: (NOTE) The Xpert Xpress SARS-CoV-2/FLU/RSV plus assay is intended as an aid in the diagnosis of influenza from Nasopharyngeal swab specimens and should not be used as a sole basis for treatment. Nasal washings and aspirates are unacceptable for Xpert Xpress SARS-CoV-2/FLU/RSV testing.  Fact Sheet for Patients: EntrepreneurPulse.com.au  Fact Sheet for Healthcare Providers: IncredibleEmployment.be  This test is not yet approved or cleared by the [place] FDA and has been authorized for detection and/or diagnosis of SARS-CoV-2 by FDA under an Emergency Use Authorization (EUA). This EUA will remain in effect (meaning this test can be used) for the duration of the COVID-19 declaration under Section 564(b)(1) of the Act, 21 U.S.C. section 360bbb-3(b)(1), unless the authorization is terminated or revoked.  Performed at [facility], [address] [place], [place] [address]   Brain natriuretic peptide     Status:  Abnormal   Collection Time: 02/15/21 10:04 AM  Result Value Ref Range   B Natriuretic Peptide 415.4 (H) 0.0 - 100.0 pg/mL    Comment: Performed at [facility], [address] [place], [place] [address]  Heparin level (unfractionated)     Status: Abnormal   Collection Time: 02/15/21  7:14 PM  Result Value Ref Range   Heparin Unfractionated <0.10 (L) 0.30 - 0.70 IU/mL    Comment: REPEATED TO VERIFY Performed at [facility], [address] [place], [place] [address]   Basic metabolic panel     Status: Abnormal   Collection Time: 02/15/21  7:14 PM  Result Value Ref Range   Sodium 139 135 - 145 mmol/L   Potassium 4.5 3.5 - 5.1 mmol/L   Chloride 108 98 - 111 mmol/L   CO2 24 22 - 32 mmol/L   Glucose, Bld 202 (H) 70 - 99 mg/dL    Comment: Glucose reference range applies only to samples taken after fasting for at least 8 hours.   BUN 26 (H) 8 - 23 mg/dL   Creatinine, Ser 0.86 0.61 - 1.24 mg/dL   Calcium 8.8 (L) 8.9 - 10.3 mg/dL   GFR, Estimated >60 >60 mL/min    Comment: (NOTE) Calculated using the CKD-EPI Creatinine Equation (2021)    Anion gap 7 5 - 15    Comment: Performed at [facility], [address] [place], [place] [address]  Basic metabolic panel     Status: Abnormal   Collection Time: 02/16/21  6:14 AM  Result Value Ref Range   Sodium 136 135 - 145 mmol/L   Potassium 4.6 3.5 - 5.1 mmol/L   Chloride 105 98 - 111 mmol/L   CO2 22 22 - 32 mmol/L   Glucose, Bld 138 (H) 70 - 99 mg/dL    Comment: Glucose reference range applies only to samples taken after fasting for at least 8 hours.   BUN 24 (H) 8 - 23 mg/dL   Creatinine, Ser 0.81 0.61 - 1.24 mg/dL   Calcium 8.8 (L) 8.9 - 10.3 mg/dL   GFR, Estimated >60 >60 mL/min    Comment: (NOTE) Calculated using the CKD-EPI Creatinine Equation (2021)    Anion gap 9 5 - 15    Comment: Performed at [facility], [address] [place],  [address]  CBC     Status: None   Collection  Time: 02/16/21  6:14 AM  Result Value  Ref Range   WBC 7.4 4.0 - 10.5 K/uL   RBC 4.42 4.22 - 5.81 MIL/uL   Hemoglobin 13.3 13.0 - 17.0 g/dL   HCT 40.3 39.0 - 52.0 %   MCV 91.2 80.0 - 100.0 fL   MCH 30.1 26.0 - 34.0 pg   MCHC 33.0 30.0 - 36.0 g/dL   RDW 14.6 11.5 - 15.5 %   Platelets 167 150 - 400 K/uL   nRBC 0.0 0.0 - 0.2 %    Comment: Performed at [facility], [address] [place], [place] [address]  Glucose, capillary     Status: Abnormal   Collection Time: 02/16/21  7:34 AM  Result Value Ref Range   Glucose-Capillary 125 (H) 70 - 99 mg/dL    Comment: Glucose reference range applies only to samples taken after fasting for at least 8 hours.  Lipid panel     Status: None   Collection Time: 03/18/21 11:10 AM  Result Value Ref Range   Cholesterol, Total 127 100 - 199 mg/dL   Triglycerides 111 0 - 149 mg/dL   HDL 63 >39 mg/dL   VLDL Cholesterol Cal 20 5 - 40 mg/dL   LDL Chol Calc (NIH) 44 0 - 99 mg/dL   Chol/HDL Ratio 2.0 0.0 - 5.0 ratio    Comment:                                   T. Chol/HDL Ratio                                             Men  Women                               1/2 Avg.Risk  3.4    3.3                                   Avg.Risk  5.0    4.4                                2X Avg.Risk  9.6    7.1                                3X Avg.Risk 23.4   11.0   PSA     Status: None   Collection Time: 03/18/21 11:10 AM  Result Value Ref Range   Prostate Specific Ag, Serum 0.5 0.0 - 4.0 ng/mL    Comment: Roche ECLIA methodology. According to the American Urological Association, Serum PSA should decrease and remain at undetectable levels after radical prostatectomy. The AUA defines biochemical recurrence as an initial PSA value 0.2 ng/mL or greater followed by a subsequent confirmatory PSA value 0.2 ng/mL or greater. Values obtained with different assay methods or kits cannot be used interchangeably. Results cannot be interpreted as absolute evidence of  the presence or absence of malignant disease.   CUP PACEART REMOTE DEVICE CHECK     Status: None   Collection Time: 03/23/21  5:11 AM  Result Value Ref  Range   Date Time Interrogation Session [id number]    Pulse Generator Manufacturer OTHER    Pulse Gen Model [id number] Gallant HF    Pulse Gen Serial Number [id number]    Clinic Name [facility]    Implantable Pulse Generator Type Cardiac Resynch Therapy Defibulator    Implantable Pulse Generator Implant Date 70962836    Implantable Lead Manufacturer [facility]    Implantable Lead Model 1458Q Quartet    Implantable Lead Serial Number [id number]    Implantable Lead Implant Date 50354656    Implantable Lead Location Detail 1 UNKNOWN    Implantable Lead Location [id number]    Implantable Lead Manufacturer [facility]    Implantable Lead Model [id number] Durata SJ4    Implantable Lead Serial Number [id number]    Implantable Lead Implant Date 17001749    Implantable Lead Location Detail 1 UNKNOWN    Implantable Lead Location [id number]    Implantable Lead Manufacturer [facility]    Implantable Lead Model [id number] Tendril MRI    Implantable Lead Serial Number [id number]    Implantable Lead Implant Date 44967591    Implantable Lead Location Detail 1 UNKNOWN    Implantable Lead Location [id number]    Lead Channel Setting Sensing Sensitivity 0.5 mV   Lead Channel Setting Sensing Adaptation Mode Adaptive Sensing    Lead Channel Setting Pacing Amplitude 2.0 V   Lead Channel Setting Pacing Pulse Width 0.5 ms   Lead Channel Setting Pacing Amplitude 1.625    Lead Channel Setting Pacing Pulse Width 0.5 ms   Lead Channel Setting Pacing Amplitude 2.125    Lead Channel Setting Pacing Capture Mode Adaptive Capture    Lead Channel Status     Lead Channel Impedance Value 510 ohm   Lead Channel Pacing Threshold Amplitude 1.625 V   Lead Channel Pacing Threshold Pulse Width 0.5 ms   Lead Channel Status     Lead Channel Impedance Value 440 ohm   Lead Channel Sensing  Intrinsic Amplitude 1.9 mV   Lead Channel Pacing Threshold Amplitude 0.75 V   Lead Channel Pacing Threshold Pulse Width 0.5 ms   Lead Channel Status     Lead Channel Impedance Value 440 ohm   Lead Channel Sensing Intrinsic Amplitude 12.0 mV   Lead Channel Pacing Threshold Amplitude 0.625 V   Lead Channel Pacing Threshold Pulse Width 0.5 ms   HighPow Impedance 56 ohm   HighPow Imped Status     Battery Status MOS    Battery Remaining Longevity 60 mo   Battery Remaining Percentage 83.0 %   Battery Voltage 2.98 V   Brady Statistic RA Percent Paced 38.0 %   Brady Statistic AP VP Percent 55.0 %   Brady Statistic AS VP Percent 27.0 %   Brady Statistic AP VS Percent 1.0 %   Brady Statistic AS VS Percent 3.0 %    Radiology CUP PACEART REMOTE DEVICE CHECK  Result Date: 03/23/2021 Scheduled remote reviewed. Normal device function.  [place]- Eliquis Next remote 91 days- JJB   Assessment/Plan  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 (HCC) ABIs today are normal at 1.24 on the right and 1.18 on the left with multiphasic waveforms and normal digital pressures and waveforms currently.  Multiple previous interventions and surgeries.  Doing well.  Continue current medical regimen including Eliquis.  He is having a hard time affording that, but this is clearly necessary as he has failed many times without full anticoagulation.  Recheck in 3 months.  Carotid stenosis Duplex today shows 1  to 39% right ICA stenosis and 40 to 59% left ICA stenosis without significant progression from previous studies.  No role for intervention at this level.  Continue to follow on annual basis.  Continue current medical regimen.  Essential hypertension blood pressure control important in reducing the progression of atherosclerotic disease. On appropriate oral medications.   HLD (hyperlipidemia) lipid control important in reducing the progression of atherosclerotic disease. Continue statin  therapy     [name], MD  04/06/2021 12:26 PM    This note was created with Dragon medical transcription system.  Any errors from dictation are purely unintentional

## 2021-04-07 ENCOUNTER — Other Ambulatory Visit (INDEPENDENT_AMBULATORY_CARE_PROVIDER_SITE_OTHER): Payer: Self-pay | Admitting: Nurse Practitioner

## 2021-04-07 ENCOUNTER — Telehealth (INDEPENDENT_AMBULATORY_CARE_PROVIDER_SITE_OTHER): Payer: Self-pay

## 2021-04-07 MED ORDER — APIXABAN 5 MG PO TABS
5.0000 mg | ORAL_TABLET | Freq: Two times a day (BID) | ORAL | 0 refills | Status: DC
Start: 2021-04-07 — End: 2021-05-20

## 2021-04-07 NOTE — Telephone Encounter (Signed)
The pt called and left a VM on the nurses line wanting to know could he have a Rx for eliquis for 90 days sent to his pharmacy .Please advise.

## 2021-04-07 NOTE — Telephone Encounter (Signed)
sent 

## 2021-04-08 NOTE — Telephone Encounter (Signed)
Faxed pt assistance application to [employer] @ [phone].  Received confirmation of fax transmission.  Placed paperwork in [employer].

## 2021-04-08 NOTE — Telephone Encounter (Signed)
I called the pt and left  a VM for the pt making him aware of the Rx being called into his pharmacy.

## 2021-04-23 ENCOUNTER — Telehealth: Payer: Self-pay | Admitting: Acute Care

## 2021-04-23 NOTE — Telephone Encounter (Signed)
Called and spoke with pt regarding lung cancer screening. Pt reports that he quit smoking in 2006. I advised pt that he no longer qualifies since he quit smoking over 15 years ago. Pt verbalized understanding.

## 2021-05-03 DIAGNOSIS — M9903 Segmental and somatic dysfunction of lumbar region: Secondary | ICD-10-CM | POA: Diagnosis not present

## 2021-05-03 DIAGNOSIS — M545 Low back pain, unspecified: Secondary | ICD-10-CM | POA: Diagnosis not present

## 2021-05-19 NOTE — Progress Notes (Signed)
? ?Follow-up Outpatient Visit ?Date: 05/20/2021 ? ?Primary Care Provider: ?Glean Hess, MD ?[address] ?[place] [address] ? ?Chief Complaint: Follow-up CAD and heart failure ? ?HPI:  [name] is a 71 y.o. male with history of CAD status post PCI to LCx in 2017 in setting of NSTEMI and subsequent development of severe distal LMCA disease status post CABG in 10/2019, chronic HFrEF (mixed ischemic and nonischemic) with normalization of LVEF, postoperative atrial fibrillation, PAD, HTN, HLD, and carotid artery stenosis status post right carotid endarterectomy, who presents for follow-up of coronary artery disease and heart failure.  I last saw him in 10/2019, at which time he was feeling fairly well but was still recovering from his bilateral femoral endarterectomies.  He was starting to exercise again.  Given no further atrial fibrillation identified by his device, we agreed to discontinue apixaban after consulting with Dr. Quentin Ore.  However, [name] has continued on aspirin and apixaban at the direction of vascular surgery. ? ?[name] continues to do well from a heart standpoint, denying chest pain, shortness of breath, palpitations, lightheadedness, and edema.  He is trying to exercise more, having returned to the gym about a month ago.  He continues to play golf regularly.  He is tolerating his medications well. ? ?-------------------------------------------------------------------------------------------------- ? ?Past Medical History:  ?Diagnosis Date  ? Abnormal nuclear cardiac imaging test 08/08/2015  ? Arthritis   ? fingers  ? Carotid artery occlusion   ? CHF (congestive heart failure) ([place])   ? Coronary atherosclerosis of native coronary artery 01/29/2013  ? 11/05/19 R/LHC 80% dLMCA stenosis small diffusely dz dLAD, chronically occluded OM1 50%mRCA lesion, widely patent mLCx strent, moderately elevated L heart filling pressures, mild to moderate RH filling pressures, normal to moderately  reduced CO  ? Duodenal erosion   ? Encounter for screening for lung cancer 07/13/2016  ? Esophageal stenosis   ? esophageal dilation  ? GERD (gastroesophageal reflux disease)   ? H. pylori infection   ? Heart attack [facility]) Oct. 2009  ? Mild  ? Hiatal hernia   ? Hyperlipidemia   ? Hypertension   ? 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 Mildly elevated troponin, isolated value in October 2009. Cardiac catheterization-nonobstructive 60% RCA disease-subsequent nuclear stress test-9 minutes, low risk, mild inferior wall hypokinesis   ? Pain in limb 12/19/2017  ? Peripheral vascular disease ([place])   ? Unstable angina ([place]) 11/25/2017  ? ?Past Surgical History:  ?Procedure Laterality Date  ? APPENDECTOMY    ? BACK SURGERY    ? BIV ICD INSERTION CRT-D N/A 03/23/2020  ? Procedure: BIV ICD INSERTION CRT-D;  Surgeon: Vickie Epley, MD;  Location: [place] [facility];  Service: Cardiovascular;  Laterality: N/A;  ? CARDIAC CATHETERIZATION N/A 08/07/2015  ? Procedure: Left Heart Cath and Coronary Angiography;  Surgeon: [name], MD;  Location: [place] [facility];  Service: Cardiovascular;  Laterality: N/A;  ? CARDIAC CATHETERIZATION N/A 08/07/2015  ? Procedure: Coronary Stent Intervention;  Surgeon: [name], MD;  Location: [place] [facility];  Service: Cardiovascular;  Laterality: N/A;  ? CARDIAC CATHETERIZATION N/A 08/07/2015  ? Procedure: Coronary Stent Intervention;  Surgeon: [name], MD;  Location: [place] [facility];  Service: Cardiovascular;  Laterality: N/A;  ? CAROTID ENDARTERECTOMY  01/05/2006  ? Right  CEA with DPA  ? CATARACT EXTRACTION W/ INTRAOCULAR LENS IMPLANT Left 12/04/2017  ? CATARACT EXTRACTION W/PHACO Left 12/04/2017  ? Procedure: CATARACT EXTRACTION PHACO AND  INTRAOCULAR LENS PLACEMENT (IOC) LEFT;  Surgeon: Eulogio Bear, MD;  Location: [place];  Service: Ophthalmology;  Laterality: Left;  ? CATARACT EXTRACTION W/PHACO Right 02/06/2018  ? Procedure: CATARACT EXTRACTION PHACO AND  INTRAOCULAR LENS PLACEMENT (IOC)RIGHT;  Surgeon: Eulogio Bear, MD;  Location: [place];  Service: Ophthalmology;  Laterality: Right;  ? COLONOSCOPY  05/20/2008  ? COLONOSCOPY WITH PROPOFOL N/A 09/13/2018  ? Procedure: COLONOSCOPY WITH BIOPSY;  Surgeon: [name], MD;  Location: [place];  Service: Endoscopy;  Laterality: N/A;  ? CORONARY ARTERY BYPASS GRAFT N/A 11/11/2019  ? Procedure: CORONARY ARTERY BYPASS GRAFTING (CABG) USING LIMA to Diag1; ENDOSCOPICALLY HARVESTED RIGHT GREATER SAPHENOUS VEIN: SVG to OM1; SVG to OM2; SVG to PDA.;  Surgeon: Ivin Poot, MD;  Location: [place];  Service: Open Heart Surgery;  Laterality: N/A;  ? CORONARY STENT PLACEMENT  08/07/2015  ? MID CIRCUMFLEX  ? ENDARTERECTOMY FEMORAL Bilateral 09/30/2020  ? Procedure: ENDARTERECTOMY FEMORAL;  Surgeon: Algernon Huxley, MD;  Location: [facility];  Service: Vascular;  Laterality: Bilateral;  ? ENDOVEIN HARVEST OF GREATER SAPHENOUS VEIN Right 11/11/2019  ? Procedure: ENDOVEIN HARVEST OF GREATER SAPHENOUS VEIN;  Surgeon: Ivin Poot, MD;  Location: [place];  Service: Open Heart Surgery;  Laterality: Right;  ? ESOPHAGOGASTRODUODENOSCOPY (EGD) WITH PROPOFOL N/A 02/11/2019  ? Procedure: ESOPHAGOGASTRODUODENOSCOPY (EGD) WITH BIOPSY and  Dilation;  Surgeon: [name], MD;  Location: [place];  Service: Endoscopy;  Laterality: N/A;  ? HIP SURGERY Left 10/2016  ? left hip tendon repair  ? LEFT HEART CATH AND CORONARY ANGIOGRAPHY N/A 11/27/2017  ? Procedure: LEFT HEART CATH AND CORONARY ANGIOGRAPHY;  Surgeon: Wellington Hampshire, MD;  Location: [place] [facility];  Service: Cardiovascular;  Laterality: N/A;  ? LOWER EXTREMITY ANGIOGRAPHY Left 02/12/2018  ? Procedure: LOWER EXTREMITY ANGIOGRAPHY;  Surgeon: Algernon Huxley, MD;  Location: [place] [facility];  Service: Cardiovascular;  Laterality: Left;  ? LOWER EXTREMITY ANGIOGRAPHY Left 03/07/2018  ? Procedure: LOWER EXTREMITY ANGIOGRAPHY;  Surgeon: Algernon Huxley, MD;   Location: [place] [facility];  Service: Cardiovascular;  Laterality: Left;  ? LOWER EXTREMITY ANGIOGRAPHY Left 06/04/2018  ? Procedure: LOWER EXTREMITY ANGIOGRAPHY;  Surgeon: Algernon Huxley, MD;  Location: [place] [facility];  Service: Cardiovascular;  Laterality: Left;  ? LOWER EXTREMITY ANGIOGRAPHY Left 09/17/2020  ? Procedure: LOWER EXTREMITY ANGIOGRAPHY;  Surgeon: Algernon Huxley, MD;  Location: [place] [facility];  Service: Cardiovascular;  Laterality: Left;  ? LOWER EXTREMITY ANGIOGRAPHY Left 09/28/2020  ? Procedure: LOWER EXTREMITY ANGIOGRAPHY;  Surgeon: Algernon Huxley, MD;  Location: [place] [facility];  Service: Cardiovascular;  Laterality: Left;  ? LOWER EXTREMITY ANGIOGRAPHY N/A 02/15/2021  ? Procedure: LOWER EXTREMITY ANGIOGRAPHY;  Surgeon: Algernon Huxley, MD;  Location: [place] [facility];  Service: Cardiovascular;  Laterality: N/A;  ? PLACEMENT OF IMPELLA LEFT VENTRICULAR ASSIST DEVICE N/A 11/11/2019  ? Procedure: PLACEMENT OF IMPELLA LEFT VENTRICULAR ASSIST DEVICE 5.5;  Surgeon: Ivin Poot, MD;  Location: [place];  Service: Open Heart Surgery;  Laterality: N/A;  Midline Sternotomy  ? POLYPECTOMY N/A 09/13/2018  ? Procedure: POLYPECTOMY;  Surgeon: [name], MD;  Location: [place];  Service: Endoscopy;  Laterality: N/A;  ? POLYPECTOMY N/A 02/11/2019  ? Procedure: POLYPECTOMY;  Surgeon: [name], MD;  Location: [place];  Service: Endoscopy;  Laterality: N/A;  ? REMOVAL OF IMPELLA LEFT VENTRICULAR ASSIST DEVICE N/A 11/15/2019  ? Procedure: REMOVAL OF IMPELLA  5.5 LEFT VENTRICULAR ASSIST DEVICE;  Surgeon: Ivin Poot, MD;  Location: [place];  Service: Open Heart Surgery;  Laterality: N/A;  ? RIGHT/LEFT HEART CATH AND CORONARY ANGIOGRAPHY N/A 11/05/2019  ? Procedure: RIGHT/LEFT HEART CATH AND CORONARY ANGIOGRAPHY;  Surgeon: Nelva Bush, MD;  Location: [place] [facility];  Service: Cardiovascular;  Laterality: N/A;  ? SPINE SURGERY    ? TEE WITHOUT CARDIOVERSION N/A  11/11/2019  ? Procedure: TRANSESOPHAGEAL ECHOCARDIOGRAM (TEE);  Surgeon: Prescott Gum, Collier Salina, MD;  Location: [place];  Service: Open Heart Surgery;  Laterality: N/A;  ? TEE WITHOUT CARDIOVERSION N/A 11/15/2019  ? Pro

## 2021-05-20 ENCOUNTER — Ambulatory Visit: Payer: PPO | Admitting: Internal Medicine

## 2021-05-20 ENCOUNTER — Telehealth: Payer: Self-pay | Admitting: *Deleted

## 2021-05-20 ENCOUNTER — Encounter: Payer: Self-pay | Admitting: Internal Medicine

## 2021-05-20 ENCOUNTER — Other Ambulatory Visit: Payer: Self-pay

## 2021-05-20 VITALS — BP 120/72 | HR 60 | Ht 66.0 in | Wt 183.0 lb

## 2021-05-20 DIAGNOSIS — I2510 Atherosclerotic heart disease of native coronary artery without angina pectoris: Secondary | ICD-10-CM | POA: Diagnosis not present

## 2021-05-20 DIAGNOSIS — I9789 Other postprocedural complications and disorders of the circulatory system, not elsewhere classified: Secondary | ICD-10-CM

## 2021-05-20 DIAGNOSIS — I10 Essential (primary) hypertension: Secondary | ICD-10-CM | POA: Diagnosis not present

## 2021-05-20 DIAGNOSIS — I5022 Chronic systolic (congestive) heart failure: Secondary | ICD-10-CM

## 2021-05-20 DIAGNOSIS — I739 Peripheral vascular disease, unspecified: Secondary | ICD-10-CM | POA: Diagnosis not present

## 2021-05-20 DIAGNOSIS — I4891 Unspecified atrial fibrillation: Secondary | ICD-10-CM | POA: Diagnosis not present

## 2021-05-20 DIAGNOSIS — E785 Hyperlipidemia, unspecified: Secondary | ICD-10-CM

## 2021-05-20 MED ORDER — PRALUENT 150 MG/ML ~~LOC~~ SOAJ: 1.0000 "pen " | SUBCUTANEOUS | 2 refills | Status: AC

## 2021-05-20 NOTE — Patient Instructions (Signed)
Medication Instructions:  ? ?Your physician has recommended you make the following change in your medication:  ? ?STOP Eliquis  ? ?*If you need a refill on your cardiac medications before your next appointment, please call your pharmacy* ? ? ?Lab Work: ? ?None ordered ? ?Testing/Procedures: ? ?None ordered ? ? ?Follow-Up: ?At [facility], you and your health needs are our priority.  As part of our continuing mission to provide you with exceptional heart care, we have created designated Provider Care Teams.  These Care Teams include your primary Cardiologist (physician) and Advanced Practice Providers (APPs -  Physician Assistants and Nurse Practitioners) who all work together to provide you with the care you need, when you need it. ? ?We recommend signing up for the patient portal called "MyChart".  Sign up information is provided on this After Visit Summary.  MyChart is used to connect with patients for Virtual Visits (Telemedicine).  Patients are able to view lab/test results, encounter notes, upcoming appointments, etc.  Non-urgent messages can be sent to your provider as well.   ?To learn more about what you can do with MyChart, go to [email or web address].   ? ?Your next appointment:   ?6 month(s) ? ?The format for your next appointment:   ?In Person ? ?Provider:   ?You may see Dr. Harrell Gave End or one of the following Advanced Practice Providers on your designated Care Team:   ?Murray Hodgkins, NP ?Christell Faith, PA-C ?Cadence Kathlen Mody, PA-C ?

## 2021-05-20 NOTE — Telephone Encounter (Signed)
Called and spoke with pt. Notified pt that per Dr. Saunders Revel, Eliquis may be stopped from a cardiac standpoint, but if Dr. Lucky Cowboy has instructed him to continue then do so. Pt will continue Eliquis 5 mg BID for PAD.  ?

## 2021-05-20 NOTE — Telephone Encounter (Signed)
Spoke with [employer] rep this morning to follow up on pt's application.  ?Office has not received notification of decision of approval/denial.  ? ?Per rep, application was cancelled due to pt's proof of out of pocket spending report of prescriptions was sent from 2022 and needs 2023.  ? ?Minimum of oop expenses of Rx is$500.  ? ?Pt being seen in office today. Will update pt and resend application with [id number] proof of income and Rx spending report.  ? ? ?

## 2021-05-20 NOTE — Telephone Encounter (Signed)
Pt states that he has not met minimum oop prescription requirement for this year.  ?Pt states he thinks after he has next month of [employer] filled he will possibly meet this amount.  ?Pt will bring paperwork to office when ready.  ?Notified pt I will keep his application and resend with 2023 Rx expense report when he brings to office.  ? ?Pt appreciative and has no further questions.  ?Paperwork placed in [employer] at this time.  ?

## 2021-06-14 ENCOUNTER — Telehealth: Payer: Self-pay

## 2021-06-14 NOTE — Telephone Encounter (Signed)
Received fax from [employer] requesting Prior Authorization for [employer]. Completed PA in covermymeds.com. KEY: [id number]  ?Received following information from covermymeds.com: Prior Authorization duplicate/approved ?Spoke with pharmacist at [facility] who states the Rx will still not go through on her end. She declined to [employer] states it is between our office and [employer]. Advised patient to contact his insurance plan to see if he can find out why the pharmacy can not get it to go through. Advised him to call our office back if there is anything that can be done on our end. Patient expressed gratitude for the call.  ?

## 2021-06-22 ENCOUNTER — Ambulatory Visit (INDEPENDENT_AMBULATORY_CARE_PROVIDER_SITE_OTHER): Payer: PPO

## 2021-06-22 DIAGNOSIS — I428 Other cardiomyopathies: Secondary | ICD-10-CM

## 2021-06-24 LAB — CUP PACEART REMOTE DEVICE CHECK
Battery Remaining Longevity: 59 mo
Battery Remaining Percentage: 79 %
Battery Voltage: 2.98 V
Brady Statistic AP VP Percent: 57 %
Brady Statistic AP VS Percent: 1 %
Brady Statistic AS VP Percent: 26 %
Brady Statistic AS VS Percent: 2.9 %
Brady Statistic RA Percent Paced: 43 %
Date Time Interrogation Session: 20230427122211
HighPow Impedance: 54 Ohm
Implantable Lead Implant Date: 20220124
Implantable Lead Implant Date: 20220124
Implantable Lead Implant Date: 20220124
Implantable Lead Location: 753858
Implantable Lead Location: 753859
Implantable Lead Location: 753860
Implantable Pulse Generator Implant Date: 20220124
Lead Channel Impedance Value: 440 Ohm
Lead Channel Impedance Value: 440 Ohm
Lead Channel Impedance Value: 500 Ohm
Lead Channel Pacing Threshold Amplitude: 0.75 V
Lead Channel Pacing Threshold Amplitude: 0.75 V
Lead Channel Pacing Threshold Amplitude: 1.5 V
Lead Channel Pacing Threshold Pulse Width: 0.5 ms
Lead Channel Pacing Threshold Pulse Width: 0.5 ms
Lead Channel Pacing Threshold Pulse Width: 0.5 ms
Lead Channel Sensing Intrinsic Amplitude: 1.9 mV
Lead Channel Sensing Intrinsic Amplitude: 12 mV
Lead Channel Setting Pacing Amplitude: 1.75 V
Lead Channel Setting Pacing Amplitude: 2 V
Lead Channel Setting Pacing Amplitude: 2 V
Lead Channel Setting Pacing Pulse Width: 0.5 ms
Lead Channel Setting Pacing Pulse Width: 0.5 ms
Lead Channel Setting Sensing Sensitivity: 0.5 mV
Pulse Gen Serial Number: 810017138

## 2021-07-03 ENCOUNTER — Other Ambulatory Visit (INDEPENDENT_AMBULATORY_CARE_PROVIDER_SITE_OTHER): Payer: Self-pay | Admitting: Nurse Practitioner

## 2021-07-05 DIAGNOSIS — M9903 Segmental and somatic dysfunction of lumbar region: Secondary | ICD-10-CM | POA: Diagnosis not present

## 2021-07-05 DIAGNOSIS — M545 Low back pain, unspecified: Secondary | ICD-10-CM | POA: Diagnosis not present

## 2021-07-06 ENCOUNTER — Encounter (INDEPENDENT_AMBULATORY_CARE_PROVIDER_SITE_OTHER): Payer: PPO

## 2021-07-06 ENCOUNTER — Ambulatory Visit (INDEPENDENT_AMBULATORY_CARE_PROVIDER_SITE_OTHER): Payer: PPO | Admitting: Vascular Surgery

## 2021-07-08 NOTE — Progress Notes (Signed)
Remote ICD transmission.   

## 2021-07-20 ENCOUNTER — Encounter (INDEPENDENT_AMBULATORY_CARE_PROVIDER_SITE_OTHER): Payer: PPO

## 2021-07-20 ENCOUNTER — Ambulatory Visit (INDEPENDENT_AMBULATORY_CARE_PROVIDER_SITE_OTHER): Payer: PPO | Admitting: Vascular Surgery

## 2021-07-24 ENCOUNTER — Other Ambulatory Visit: Payer: Self-pay | Admitting: Internal Medicine

## 2021-07-27 MED ORDER — CARVEDILOL 12.5 MG PO TABS: 12.5000 mg | ORAL_TABLET | Freq: Two times a day (BID) | ORAL | 0 refills | Status: DC

## 2021-07-27 NOTE — Telephone Encounter (Signed)
*  STAT* If patient is at the pharmacy, call can be transferred to refill team.   1. Which medications need to be refilled? (please list name of each medication and dose if known) Carvedilol  2. Which pharmacy/location (including street and city if local pharmacy) is medication to be sent to?[facility]  [place], [place]- please change his pharmacy in his Chart to [facility]  3. Do they need a 30 day or 90 day supply? 90 days and refills

## 2021-08-16 NOTE — Telephone Encounter (Signed)
Received noticed from [employer] that pt has been enrolled to receive assistance as of 08/16/21.   Called pt to make aware. No answer. LDM ok per DPR. Asked pt to call with any further questions.   Paperwork placed in [employer].

## 2021-08-18 ENCOUNTER — Other Ambulatory Visit
Admission: RE | Admit: 2021-08-18 | Discharge: 2021-08-18 | Disposition: A | Discharge status: Home / Self Care | Payer: PPO | Attending: Cardiology | Admitting: Cardiology

## 2021-08-18 ENCOUNTER — Encounter: Payer: Self-pay | Admitting: Cardiology

## 2021-08-18 ENCOUNTER — Ambulatory Visit (INDEPENDENT_AMBULATORY_CARE_PROVIDER_SITE_OTHER): Payer: PPO | Admitting: Cardiology

## 2021-08-18 VITALS — BP 114/68 | HR 65 | Ht 66.0 in | Wt 178.6 lb

## 2021-08-18 DIAGNOSIS — I493 Ventricular premature depolarization: Secondary | ICD-10-CM | POA: Insufficient documentation

## 2021-08-18 DIAGNOSIS — I5022 Chronic systolic (congestive) heart failure: Secondary | ICD-10-CM

## 2021-08-18 DIAGNOSIS — Z95810 Presence of automatic (implantable) cardiac defibrillator: Secondary | ICD-10-CM | POA: Insufficient documentation

## 2021-08-18 LAB — CUP PACEART INCLINIC DEVICE CHECK
Battery Remaining Longevity: 55 mo
Brady Statistic RA Percent Paced: 44 %
Brady Statistic RV Percent Paced: 83 %
Date Time Interrogation Session: 20230621092515
HighPow Impedance: 60.75 Ohm
Implantable Lead Implant Date: 20220124
Implantable Lead Implant Date: 20220124
Implantable Lead Implant Date: 20220124
Implantable Lead Location: 753858
Implantable Lead Location: 753859
Implantable Lead Location: 753860
Implantable Pulse Generator Implant Date: 20220124
Lead Channel Impedance Value: 462.5 Ohm
Lead Channel Impedance Value: 475 Ohm
Lead Channel Impedance Value: 562.5 Ohm
Lead Channel Pacing Threshold Amplitude: 0.75 V
Lead Channel Pacing Threshold Amplitude: 1.75 V
Lead Channel Pacing Threshold Amplitude: 2.25 V
Lead Channel Pacing Threshold Pulse Width: 0.5 ms
Lead Channel Pacing Threshold Pulse Width: 0.5 ms
Lead Channel Pacing Threshold Pulse Width: 0.5 ms
Lead Channel Sensing Intrinsic Amplitude: 12 mV
Lead Channel Sensing Intrinsic Amplitude: 2.6 mV
Lead Channel Setting Pacing Amplitude: 1.75 V
Lead Channel Setting Pacing Amplitude: 2 V
Lead Channel Setting Pacing Amplitude: 2.25 V
Lead Channel Setting Pacing Pulse Width: 0.5 ms
Lead Channel Setting Pacing Pulse Width: 0.5 ms
Lead Channel Setting Sensing Sensitivity: 0.5 mV
Pulse Gen Serial Number: 810017138

## 2021-08-18 LAB — COMPREHENSIVE METABOLIC PANEL
ALT: 27 U/L (ref 0–44)
AST: 24 U/L (ref 15–41)
Albumin: 4.2 g/dL (ref 3.5–5.0)
Alkaline Phosphatase: 77 U/L (ref 38–126)
Anion gap: 7 (ref 5–15)
BUN: 36 mg/dL — ABNORMAL HIGH (ref 8–23)
CO2: 24 mmol/L (ref 22–32)
Calcium: 9.4 mg/dL (ref 8.9–10.3)
Chloride: 108 mmol/L (ref 98–111)
Creatinine, Ser: 1.12 mg/dL (ref 0.61–1.24)
GFR, Estimated: >60 mL/min (ref >60)
Glucose, Bld: 112 mg/dL — ABNORMAL HIGH (ref 70–99)
Potassium: 5 mmol/L (ref 3.5–5.1)
Sodium: 139 mmol/L (ref 135–145)
Total Bilirubin: 0.6 mg/dL (ref 0.3–1.2)
Total Protein: 7.6 g/dL (ref 6.5–8.1)

## 2021-08-18 LAB — TSH: TSH: 1.504 u[IU]/mL (ref 0.350–4.500)

## 2021-08-18 LAB — PACEMAKER DEVICE OBSERVATION

## 2021-08-18 LAB — T4, FREE: Free T4: 0.94 ng/dL (ref 0.61–1.12)

## 2021-08-18 MED ORDER — AMIODARONE HCL 200 MG PO TABS: ORAL_TABLET | ORAL | 3 refills | Status: DC

## 2021-08-18 NOTE — Patient Instructions (Addendum)
Medications: Amiodarone 200 mg two times a day for 5 day then 200 mg daily Your physician recommends that you continue on your current medications as directed. Please refer to the Current Medication list given to you today. *If you need a refill on your cardiac medications before your next appointment, please call your pharmacy*  Lab Work: CMP, TSH, Free T4  If you have labs (blood work) drawn today and your tests are completely normal, you will receive your results only by: [place] (if you have MyChart) OR A paper copy in the mail If you have any lab test that is abnormal or we need to change your treatment, we will call you to review the results.  Testing/Procedures: None.  Follow-Up: At [facility], you and your health needs are our priority.  As part of our continuing mission to provide you with exceptional heart care, we have created designated Provider Care Teams.  These Care Teams include your primary Cardiologist (physician) and Advanced Practice Providers (APPs -  Physician Assistants and Nurse Practitioners) who all work together to provide you with the care you need, when you need it.  Your physician wants you to follow-up in: 3 months with Lars Mage, MD repeat lab work on the same day before at [place] at [facility] 1st desk on the right to check in Lab hours: 7:30 am- 5:30 pm (walk in basis)   We recommend signing up for the patient portal called "MyChart".  Sign up information is provided on this After Visit Summary.  MyChart is used to connect with patients for Virtual Visits (Telemedicine).  Patients are able to view lab/test results, encounter notes, upcoming appointments, etc.  Non-urgent messages can be sent to your provider as well.   To learn more about what you can do with MyChart, go to [email or web address].    Any Other Special Instructions Will Be Listed Below (If Applicable).

## 2021-08-18 NOTE — Progress Notes (Signed)
Electrophysiology Office Follow up Visit Note:    Date:  08/18/2021   ID:  [name], DOB 09-27-1950, MRN [id number]  PCP:  Glean Hess, MD  [facility] Cardiologist:  Nelva Bush, MD  [facility] Electrophysiologist:  Vickie Epley, MD    Interval History:    [name] is a 71 y.o. male who presents for a follow up visit. They were last seen in clinic October 07, 2020.  He had a CRT-D implanted March 23, 2020.  His ejection fraction improved to 55% from 30% with CRT.  On the last device check, patient's BiV pacing percentage was down to 83% with a PVC burden of at least 14%.  He presents today to discuss management options to help optimize his CRT pacing.  The patient and his wife received new cell phone in April and have not been remotely transmitting since May.       Past Medical History:  Diagnosis Date   Abnormal nuclear cardiac imaging test 08/08/2015   Arthritis    fingers   Carotid artery occlusion    CHF (congestive heart failure) (HCC)    Coronary atherosclerosis of native coronary artery 01/29/2013   11/05/19 R/LHC 80% dLMCA stenosis small diffusely dz dLAD, chronically occluded OM1 50%mRCA lesion, widely patent mLCx strent, moderately elevated L heart filling pressures, mild to moderate RH filling pressures, normal to moderately reduced CO   Duodenal erosion    Encounter for screening for lung cancer 07/13/2016   Esophageal stenosis    esophageal dilation   GERD (gastroesophageal reflux disease)    H. pylori infection    Heart attack ([place]) Oct. 2009   Mild   Hiatal hernia    Hyperlipidemia    Hypertension    Old myocardial infarction 11/29/2007   Mildly elevated troponin, isolated value in October 2009. Cardiac catheterization-nonobstructive 60% RCA disease-subsequent nuclear stress test-9 minutes, low risk, mild inferior wall hypokinesis    Pain in limb 12/19/2017   Peripheral vascular disease ([place])    Unstable angina ([place]) 11/25/2017     Past Surgical History:  Procedure Laterality Date   APPENDECTOMY     BACK SURGERY     BIV ICD INSERTION CRT-D N/A 03/23/2020   Procedure: BIV ICD INSERTION CRT-D;  Surgeon: Vickie Epley, MD;  Location: [place] [facility];  Service: Cardiovascular;  Laterality: N/A;   CARDIAC CATHETERIZATION N/A 08/07/2015   Procedure: Left Heart Cath and Coronary Angiography;  Surgeon: [name], MD;  Location: [place] [facility];  Service: Cardiovascular;  Laterality: N/A;   CARDIAC CATHETERIZATION N/A 08/07/2015   Procedure: Coronary Stent Intervention;  Surgeon: [name], MD;  Location: [place] [facility];  Service: Cardiovascular;  Laterality: N/A;   CARDIAC CATHETERIZATION N/A 08/07/2015   Procedure: Coronary Stent Intervention;  Surgeon: [name], MD;  Location: [place] [facility];  Service: Cardiovascular;  Laterality: N/A;   CAROTID ENDARTERECTOMY  01/05/2006   Right  CEA with DPA   CATARACT EXTRACTION W/ INTRAOCULAR LENS IMPLANT Left 12/04/2017   CATARACT EXTRACTION W/PHACO Left 12/04/2017   Procedure: CATARACT EXTRACTION PHACO AND INTRAOCULAR LENS PLACEMENT ([place]) LEFT;  Surgeon: Eulogio Bear, MD;  Location: [place];  Service: Ophthalmology;  Laterality: Left;   CATARACT EXTRACTION W/PHACO Right 02/06/2018   Procedure: CATARACT EXTRACTION PHACO AND INTRAOCULAR LENS PLACEMENT (I[facility])RIGHT;  Surgeon: Eulogio Bear, MD;  Location: [place];  Service: Ophthalmology;  Laterality: Right;   COLONOSCOPY  05/20/2008  COLONOSCOPY WITH PROPOFOL N/A 09/13/2018   Procedure: COLONOSCOPY WITH BIOPSY;  Surgeon: [name], MD;  Location: [place];  Service: Endoscopy;  Laterality: N/A;   CORONARY ARTERY BYPASS GRAFT N/A 11/11/2019   Procedure: CORONARY ARTERY BYPASS GRAFTING (CABG) USING LIMA to Diag1; ENDOSCOPICALLY HARVESTED RIGHT GREATER SAPHENOUS VEIN: SVG to OM1; SVG to OM2; SVG to PDA.;  Surgeon: Ivin Poot, MD;  Location: [place];  Service:  Open Heart Surgery;  Laterality: N/A;   CORONARY STENT PLACEMENT  08/07/2015   MID CIRCUMFLEX   ENDARTERECTOMY FEMORAL Bilateral 09/30/2020   Procedure: ENDARTERECTOMY FEMORAL;  Surgeon: Algernon Huxley, MD;  Location: [facility];  Service: Vascular;  Laterality: Bilateral;   ENDOVEIN HARVEST OF GREATER SAPHENOUS VEIN Right 11/11/2019   Procedure: ENDOVEIN HARVEST OF GREATER SAPHENOUS VEIN;  Surgeon: Ivin Poot, MD;  Location: [place];  Service: Open Heart Surgery;  Laterality: Right;   ESOPHAGOGASTRODUODENOSCOPY (EGD) WITH PROPOFOL N/A 02/11/2019   Procedure: ESOPHAGOGASTRODUODENOSCOPY (EGD) WITH BIOPSY and  Dilation;  Surgeon: [name], MD;  Location: [place];  Service: Endoscopy;  Laterality: N/A;   HIP SURGERY Left 10/2016   left hip tendon repair   LEFT HEART CATH AND CORONARY ANGIOGRAPHY N/A 11/27/2017   Procedure: LEFT HEART CATH AND CORONARY ANGIOGRAPHY;  Surgeon: Wellington Hampshire, MD;  Location: [place] [facility];  Service: Cardiovascular;  Laterality: N/A;   LOWER EXTREMITY ANGIOGRAPHY Left 02/12/2018   Procedure: LOWER EXTREMITY ANGIOGRAPHY;  Surgeon: Algernon Huxley, MD;  Location: [place] [facility];  Service: Cardiovascular;  Laterality: Left;   LOWER EXTREMITY ANGIOGRAPHY Left 03/07/2018   Procedure: LOWER EXTREMITY ANGIOGRAPHY;  Surgeon: Algernon Huxley, MD;  Location: [place] [facility];  Service: Cardiovascular;  Laterality: Left;   LOWER EXTREMITY ANGIOGRAPHY Left 06/04/2018   Procedure: LOWER EXTREMITY ANGIOGRAPHY;  Surgeon: Algernon Huxley, MD;  Location: [place] [facility];  Service: Cardiovascular;  Laterality: Left;   LOWER EXTREMITY ANGIOGRAPHY Left 09/17/2020   Procedure: LOWER EXTREMITY ANGIOGRAPHY;  Surgeon: Algernon Huxley, MD;  Location: [place] [facility];  Service: Cardiovascular;  Laterality: Left;   LOWER EXTREMITY ANGIOGRAPHY Left 09/28/2020   Procedure: LOWER EXTREMITY ANGIOGRAPHY;  Surgeon: Algernon Huxley, MD;  Location: [place] [facility];  Service:  Cardiovascular;  Laterality: Left;   LOWER EXTREMITY ANGIOGRAPHY N/A 02/15/2021   Procedure: LOWER EXTREMITY ANGIOGRAPHY;  Surgeon: Algernon Huxley, MD;  Location: [place] [facility];  Service: Cardiovascular;  Laterality: N/A;   PLACEMENT OF IMPELLA LEFT VENTRICULAR ASSIST DEVICE N/A 11/11/2019   Procedure: PLACEMENT OF IMPELLA LEFT VENTRICULAR ASSIST DEVICE 5.5;  Surgeon: Ivin Poot, MD;  Location: [place];  Service: Open Heart Surgery;  Laterality: N/A;  Midline Sternotomy   POLYPECTOMY N/A 09/13/2018   Procedure: POLYPECTOMY;  Surgeon: [name], MD;  Location: [place];  Service: Endoscopy;  Laterality: N/A;   POLYPECTOMY N/A 02/11/2019   Procedure: POLYPECTOMY;  Surgeon: [name], MD;  Location: [place];  Service: Endoscopy;  Laterality: N/A;   REMOVAL OF IMPELLA LEFT VENTRICULAR ASSIST DEVICE N/A 11/15/2019   Procedure: REMOVAL OF IMPELLA 5.5 LEFT VENTRICULAR ASSIST DEVICE;  Surgeon: Ivin Poot, MD;  Location: [place];  Service: Open Heart Surgery;  Laterality: N/A;   RIGHT/LEFT HEART CATH AND CORONARY ANGIOGRAPHY N/A 11/05/2019   Procedure: RIGHT/LEFT HEART CATH AND CORONARY ANGIOGRAPHY;  Surgeon: Nelva Bush, MD;  Location: [place] [facility];  Service: Cardiovascular;  Laterality: N/A;   SPINE SURGERY  TEE WITHOUT CARDIOVERSION N/A 11/11/2019   Procedure: TRANSESOPHAGEAL ECHOCARDIOGRAM (TEE);  Surgeon: Prescott Gum, Collier Salina, MD;  Location: [place];  Service: Open Heart Surgery;  Laterality: N/A;   TEE WITHOUT CARDIOVERSION N/A 11/15/2019   Procedure: TRANSESOPHAGEAL ECHOCARDIOGRAM (TEE);  Surgeon: Prescott Gum, Collier Salina, MD;  Location: [place];  Service: Open Heart Surgery;  Laterality: N/A;   TONSILLECTOMY      Current Medications: Current Meds  Medication Sig   aspirin EC 81 MG tablet Take 1 tablet (81 mg total) by mouth daily.   carvedilol (COREG) 12.5 MG tablet Take 1 tablet (12.5 mg total) by mouth 2 (two) times daily.   colchicine 0.6 MG tablet Take  1 tablet (0.6 mg total) by mouth 2 (two) times daily.   ELIQUIS 5 MG TABS tablet TAKE 1 TABLET BY MOUTH TWICE A DAY   nitroGLYCERIN (NITROSTAT) 0.4 MG SL tablet Place 1 tablet (0.4 mg total) under the tongue every 5 (five) minutes as needed for chest pain.   sacubitril-valsartan (ENTRESTO) 49-51 MG Take 1 tablet by mouth 2 (two) times daily. Need to make an appointment for further refills   traZODone (DESYREL) 50 MG tablet Take 1 tablet (50 mg total) by mouth at bedtime as needed. for sleep     Allergies:   Brilinta [ticagrelor], Chlorhexidine gluconate, Contrast media [iodinated contrast media], Statins, and Zetia [ezetimibe]   Social History   Socioeconomic History   Marital status: Married    Spouse name: Not on file   Number of children: 1   Years of education: Not on file   Highest education level: Bachelor's degree (e.g., BA, AB, BS)  Occupational History   Not on file  Tobacco Use   Smoking status: Former    Packs/day: 1.25    Years: 35.00    Total pack years: 43.75    Types: Cigarettes    Quit date: 02/28/2005    Years since quitting: 16.4   Smokeless tobacco: Current    Types: Snuff   Tobacco comments:    occaisionally  Vaping Use   Vaping Use: Never used  Substance and Sexual Activity   Alcohol use: Yes    Alcohol/week: 8.0 - 10.0 standard drinks of alcohol    Types: 8 - 10 Glasses of wine per week    Comment: weekly   Drug use: No   Sexual activity: Yes  Other Topics Concern   Not on file  Social History Narrative   Not on file   Social Determinants of Health   Financial Resource Strain: Low Risk  (09/23/2020)   Overall Financial Resource Strain (CARDIA)    Difficulty of Paying Living Expenses: Not hard at all  Food Insecurity: No Food Insecurity (09/23/2020)   Hunger Vital Sign    Worried About Running Out of Food in the Last Year: Never true    Ran Out of Food in the Last Year: Never true  Transportation Needs: No Transportation Needs (09/23/2020)    PRAPARE - Hydrologist (Medical): No    Lack of Transportation (Non-Medical): No  Physical Activity: Insufficiently Active (09/23/2020)   Exercise Vital Sign    Days of Exercise per Week: 2 days    Minutes of Exercise per Session: 60 min  Stress: No Stress Concern Present (09/23/2020)   [place]    Feeling of Stress : Not at all  Social Connections: Moderately Integrated (09/23/2020)   Social Connection and Isolation Panel [NHANES]  Frequency of Communication with Friends and Family: More than three times a week    Frequency of Social Gatherings with Friends and Family: Three times a week    Attends Religious Services: Never    Active Member of Clubs or Organizations: Yes    Attends Music therapist: More than 4 times per year    Marital Status: Married     Family History: The patient's family history includes Cancer (age of onset: 73) in his father; Coronary artery disease in his mother; Diabetes in his mother; Heart attack in his father and mother; Heart disease in his father and mother; Hypertension in his father and mother; Stroke in his father. There is no history of Colon cancer, Colon polyps, Esophageal cancer, Rectal cancer, or Stomach cancer.  ROS:   Please see the history of present illness.    All other systems reviewed and are negative.  EKGs/Labs/Other Studies Reviewed:    The following studies were reviewed today:  August 18, 2021 in clinic device interrogation personally reviewed Battery longevity 4.7 to 5 years Lead parameters stable Programmed DDDR 60-1 30 I have reprogrammed VT 1 to start at 160 as a monitor only zone.  VT 2 will start at 181 and provide 3 ATP schemes followed by shocks.  The VF zone will start at 214 and provide ATP while charging and then shocks. On May 23 he had a nonsustained VT at 207 bpm.  He had another episode that lasted 29 seconds  but fell into the monitor only zone.  The VT was monomorphic.  And had a cycle length of 285 ms.  He had a VF episode on Jul 18, 2021 with a cycle length of 275 ms that received ATP x1 successfully converting back to sinus rhythm with PVCs.  Another VF occurred on May 21 with a cycle length of 275 ms that was nonsustained and did not receive therapy.    Recent Labs: 10/02/2020: Magnesium 2.1 02/15/2021: ALT 25; B Natriuretic Peptide 415.4 02/16/2021: BUN 24; Creatinine, Ser 0.81; Hemoglobin 13.3; Platelets 167; Potassium 4.6; Sodium 136  Recent Lipid Panel    Component Value Date/Time   CHOL 127 03/18/2021 1110   TRIG 111 03/18/2021 1110   HDL 63 03/18/2021 1110   CHOLHDL 2.0 03/18/2021 1110   CHOLHDL 2.3 01/07/2020 1601   VLDL 32 01/07/2020 1601   LDLCALC 44 03/18/2021 1110    Physical Exam:    VS:  BP 114/68   Pulse 65   Ht '5\' 6"'$  (1.676 m)   Wt 178 lb 9.6 oz (81 kg)   SpO2 96%   BMI 28.83 kg/m     Wt Readings from Last 3 Encounters:  08/18/21 178 lb 9.6 oz (81 kg)  05/20/21 183 lb (83 kg)  04/06/21 186 lb 9.6 oz (84.6 kg)     GEN:  Well nourished, well developed in no acute distress HEENT: Normal NECK: No JVD; No carotid bruits LYMPHATICS: No lymphadenopathy CARDIAC: RRR, no murmurs, rubs, gallops.  CRT-D in place RESPIRATORY:  Clear to auscultation without rales, wheezing or rhonchi  ABDOMEN: Soft, non-tender, non-distended MUSCULOSKELETAL:  No edema; No deformity  SKIN: Warm and dry NEUROLOGIC:  Alert and oriented x 3 PSYCHIATRIC:  Normal affect        ASSESSMENT:    1. Chronic HFrEF (heart failure with reduced ejection fraction) (HCC)   2. 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 PVC (premature ventricular contraction)    PLAN:  In order of problems listed above:  #Chronic systolic heart failure NYHA class I-II.  Doing well with CRT in place.  BiV pacing percentage is suboptimal because of frequent PVCs.  I would like to  start Amio to suppress the PVCs to help optimize his BiV pacing.  We have a Garn discussion with the patient his wife during today's clinic visit about amiodarone.  He did not tolerate high-dose amiodarone load in the past.  I will plan to start 200 mg by mouth twice daily for 5 days followed by 200 mg by mouth once daily thereafter.  I will check CMP, TSH and free T4 today and again in 90 days.  I will plan to see him back in 90 days.  #Ventricular tachycardia/ventricular fibrillation Successfully treated by the device with ATP x1.  I adjusted his zones today to give him a monitor zone at 160, VT zone at 180 and a VF zone at 214.  ATPs prior to shocks in the VT zone.  Amio as above.  Driving restrictions discussed.  Follow-up in 90 days with me.   Total time spent with patient today 45 minutes. This includes reviewing records, evaluating the patient and coordinating care.   Medication Adjustments/Labs and Tests Ordered: Current medicines are reviewed at length with the patient today.  Concerns regarding medicines are outlined above.  No orders of the defined types were placed in this encounter.  No orders of the defined types were placed in this encounter.    Signed, Lars Mage, MD, [facility], [facility] 08/18/2021 9:01 AM    Electrophysiology [facility]

## 2021-08-30 DIAGNOSIS — M545 Low back pain, unspecified: Secondary | ICD-10-CM | POA: Diagnosis not present

## 2021-08-30 DIAGNOSIS — M9903 Segmental and somatic dysfunction of lumbar region: Secondary | ICD-10-CM | POA: Diagnosis not present

## 2021-09-13 ENCOUNTER — Encounter: Payer: Self-pay | Admitting: Cardiology

## 2021-09-21 ENCOUNTER — Ambulatory Visit (INDEPENDENT_AMBULATORY_CARE_PROVIDER_SITE_OTHER): Payer: PPO

## 2021-09-21 DIAGNOSIS — I428 Other cardiomyopathies: Secondary | ICD-10-CM | POA: Diagnosis not present

## 2021-09-22 ENCOUNTER — Other Ambulatory Visit: Payer: Self-pay | Admitting: Internal Medicine

## 2021-09-22 ENCOUNTER — Encounter: Payer: Self-pay | Admitting: Internal Medicine

## 2021-09-22 DIAGNOSIS — F5101 Primary insomnia: Secondary | ICD-10-CM

## 2021-09-22 DIAGNOSIS — M109 Gout, unspecified: Secondary | ICD-10-CM

## 2021-09-22 LAB — CUP PACEART REMOTE DEVICE CHECK
Battery Remaining Longevity: 53 mo
Battery Remaining Percentage: 75 %
Battery Voltage: 2.98 V
Brady Statistic AP VP Percent: 73 %
Brady Statistic AP VS Percent: 1.7 %
Brady Statistic AS VP Percent: 15 %
Brady Statistic AS VS Percent: 1 %
Brady Statistic RA Percent Paced: 70 %
Date Time Interrogation Session: 20230726104131
HighPow Impedance: 57 Ohm
Implantable Lead Implant Date: 20220124
Implantable Lead Implant Date: 20220124
Implantable Lead Implant Date: 20220124
Implantable Lead Location: 753858
Implantable Lead Location: 753859
Implantable Lead Location: 753860
Implantable Pulse Generator Implant Date: 20220124
Lead Channel Impedance Value: 460 Ohm
Lead Channel Impedance Value: 490 Ohm
Lead Channel Impedance Value: 510 Ohm
Lead Channel Pacing Threshold Amplitude: 0.75 V
Lead Channel Pacing Threshold Amplitude: 0.75 V
Lead Channel Pacing Threshold Amplitude: 1.875 V
Lead Channel Pacing Threshold Pulse Width: 0.5 ms
Lead Channel Pacing Threshold Pulse Width: 0.5 ms
Lead Channel Pacing Threshold Pulse Width: 0.5 ms
Lead Channel Sensing Intrinsic Amplitude: 12 mV
Lead Channel Sensing Intrinsic Amplitude: 2.2 mV
Lead Channel Setting Pacing Amplitude: 1.75 V
Lead Channel Setting Pacing Amplitude: 2 V
Lead Channel Setting Pacing Amplitude: 2.375
Lead Channel Setting Pacing Pulse Width: 0.5 ms
Lead Channel Setting Pacing Pulse Width: 0.5 ms
Lead Channel Setting Sensing Sensitivity: 0.5 mV
Pulse Gen Serial Number: 810017138

## 2021-09-22 MED ORDER — TRAZODONE HCL 50 MG PO TABS: 50.0000 mg | ORAL_TABLET | Freq: Every evening | ORAL | 1 refills | Status: DC | PRN

## 2021-09-22 MED ORDER — COLCHICINE 0.6 MG PO TABS: 0.6000 mg | ORAL_TABLET | Freq: Two times a day (BID) | ORAL | 0 refills | Status: DC

## 2021-09-27 ENCOUNTER — Encounter: Payer: Self-pay | Admitting: Emergency Medicine

## 2021-09-27 ENCOUNTER — Ambulatory Visit: Payer: PPO | Admitting: Emergency Medicine

## 2021-09-27 DIAGNOSIS — Z0000 Encounter for general adult medical examination without abnormal findings: Secondary | ICD-10-CM | POA: Diagnosis not present

## 2021-09-27 NOTE — Progress Notes (Signed)
Annual Wellness Visit  Subjective:   [name] is a 71 y.o. Male who presents for Medicare Annual (Subsequent) preventive examination.  I connected with  [name] on 09/27/21 by a audio enabled telemedicine application and verified that I am speaking with the correct person using two identifiers.  Patient Location: Home  Provider Location: Home Office  I discussed the limitations of evaluation and management by telemedicine. The patient expressed understanding and agreed to proceed.   [name] is a 71 y.o. male who presents today for his Annual Wellness Visit.  The patient does not participate in regular exercise at present. He generally feels well.  He does not have additional problems to discuss today.        Objective:    There were no vitals filed for this visit.   BP Readings from Last 3 Encounters:  08/18/21 114/68  05/20/21 120/72  04/06/21 119/76   Wt Readings from Last 3 Encounters:  08/18/21 178 lb 9.6 oz (81 kg)  05/20/21 183 lb (83 kg)  04/06/21 186 lb 9.6 oz (84.6 kg)      There is no height or weight on file to calculate BMI.     03/04/2021    8:13 AM 02/15/2021    8:09 AM 09/30/2020   11:49 AM 09/28/2020    5:00 PM 09/23/2020    8:55 AM 09/17/2020    9:34 AM 03/23/2020    9:44 AM  Advanced Directives  Does Patient Have a Medical Advance Directive? No No Yes Yes Yes Yes Yes  Type of Comptroller;Living will Living will;Healthcare Power of [place];Living will [place];Living will   Does patient want to make changes to medical advance directive?   No - Patient declined No - Patient declined   No - Patient declined  Copy of [place] in Chart?    No - copy requested No - copy requested    Would patient like information on creating a medical advance directive?  No - Patient declined No - Patient declined No - Patient declined       Current  Medications (verified) Outpatient Encounter Medications as of 09/27/2021  Medication Sig   amiodarone (PACERONE) 200 MG tablet Take 1 tablet (200 mg total) by mouth 2 (two) times daily for 5 days, THEN 1 tablet (200 mg total) daily.   aspirin EC 81 MG tablet Take 1 tablet (81 mg total) by mouth daily.   carvedilol (COREG) 12.5 MG tablet Take 1 tablet (12.5 mg total) by mouth 2 (two) times daily.   colchicine 0.6 MG tablet Take 1 tablet (0.6 mg total) by mouth 2 (two) times daily.   ELIQUIS 5 MG TABS tablet TAKE 1 TABLET BY MOUTH TWICE A DAY   nitroGLYCERIN (NITROSTAT) 0.4 MG SL tablet Place 1 tablet (0.4 mg total) under the tongue every 5 (five) minutes as needed for chest pain.   PRALUENT 150 MG/ML SOAJ Inject 150 mg into the skin every 14 (fourteen) days.   sacubitril-valsartan (ENTRESTO) 49-51 MG Take 1 tablet by mouth 2 (two) times daily. Need to make an appointment for further refills   traZODone (DESYREL) 50 MG tablet Take 1 tablet (50 mg total) by mouth at bedtime as needed. for sleep   No facility-administered encounter medications on file as of 09/27/2021.    Allergies (verified) Brilinta [ticagrelor], Chlorhexidine gluconate, Contrast media [iodinated contrast media], Statins, and Zetia [  ezetimibe]   History: Past Medical History:  Diagnosis Date   Abnormal nuclear cardiac imaging test 08/08/2015   Arthritis    fingers   Carotid artery occlusion    CHF (congestive heart failure) (HCC)    Coronary atherosclerosis of native coronary artery 01/29/2013   11/05/19 R/LHC 80% dLMCA stenosis small diffusely dz dLAD, chronically occluded OM1 50%mRCA lesion, widely patent mLCx strent, moderately elevated L heart filling pressures, mild to moderate RH filling pressures, normal to moderately reduced CO   Duodenal erosion    Encounter for screening for lung cancer 07/13/2016   Esophageal stenosis    esophageal dilation   GERD (gastroesophageal reflux disease)    H. pylori infection    Heart  attack ([place]) Oct. 2009   Mild   Hiatal hernia    Hyperlipidemia    Hypertension    Old myocardial infarction 11/29/2007   Mildly elevated troponin, isolated value in October 2009. Cardiac catheterization-nonobstructive 60% RCA disease-subsequent nuclear stress test-9 minutes, low risk, mild inferior wall hypokinesis    Pain in limb 12/19/2017   Peripheral vascular disease ([place])    Unstable angina ([place]) 11/25/2017   Past Surgical History:  Procedure Laterality Date   APPENDECTOMY     BACK SURGERY     BIV ICD INSERTION CRT-D N/A 03/23/2020   Procedure: BIV ICD INSERTION CRT-D;  Surgeon: Vickie Epley, MD;  Location: [facility];  Service: Cardiovascular;  Laterality: N/A;   CARDIAC CATHETERIZATION N/A 08/07/2015   Procedure: Left Heart Cath and Coronary Angiography;  Surgeon: [name], MD;  Location: [facility];  Service: Cardiovascular;  Laterality: N/A;   CARDIAC CATHETERIZATION N/A 08/07/2015   Procedure: Coronary Stent Intervention;  Surgeon: [name], MD;  Location: [facility];  Service: Cardiovascular;  Laterality: N/A;   CARDIAC CATHETERIZATION N/A 08/07/2015   Procedure: Coronary Stent Intervention;  Surgeon: [name], MD;  Location: [facility];  Service: Cardiovascular;  Laterality: N/A;   CAROTID ENDARTERECTOMY  01/05/2006   Right  CEA with DPA   CATARACT EXTRACTION W/ INTRAOCULAR LENS IMPLANT Left 12/04/2017   CATARACT EXTRACTION W/PHACO Left 12/04/2017   Procedure: CATARACT EXTRACTION PHACO AND INTRAOCULAR LENS PLACEMENT ([place]) LEFT;  Surgeon: Eulogio Bear, MD;  Location: [facility];  Service: Ophthalmology;  Laterality: Left;   CATARACT EXTRACTION W/PHACO Right 02/06/2018   Procedure: CATARACT EXTRACTION PHACO AND INTRAOCULAR LENS PLACEMENT (IOC)RIGHT;  Surgeon: Eulogio Bear, MD;  Location: [facility];  Service: Ophthalmology;  Laterality: Right;   COLONOSCOPY  05/20/2008   COLONOSCOPY WITH PROPOFOL N/A  09/13/2018   Procedure: COLONOSCOPY WITH BIOPSY;  Surgeon: [name], MD;  Location: [facility];  Service: Endoscopy;  Laterality: N/A;   CORONARY ARTERY BYPASS GRAFT N/A 11/11/2019   Procedure: CORONARY ARTERY BYPASS GRAFTING (CABG) USING LIMA to Diag1; ENDOSCOPICALLY HARVESTED RIGHT GREATER SAPHENOUS VEIN: SVG to OM1; SVG to OM2; SVG to PDA.;  Surgeon: Ivin Poot, MD;  Location: [facility];  Service: Open Heart Surgery;  Laterality: N/A;   CORONARY STENT PLACEMENT  08/07/2015   MID CIRCUMFLEX   ENDARTERECTOMY FEMORAL Bilateral 09/30/2020   Procedure: ENDARTERECTOMY FEMORAL;  Surgeon: Algernon Huxley, MD;  Location: [facility];  Service: Vascular;  Laterality: Bilateral;   ENDOVEIN HARVEST OF GREATER SAPHENOUS VEIN Right 11/11/2019   Procedure: ENDOVEIN HARVEST OF GREATER SAPHENOUS VEIN;  Surgeon: Ivin Poot, MD;  Location: [facility];  Service: Open Heart Surgery;  Laterality: Right;  ESOPHAGOGASTRODUODENOSCOPY (EGD) WITH PROPOFOL N/A 02/11/2019   Procedure: ESOPHAGOGASTRODUODENOSCOPY (EGD) WITH BIOPSY and  Dilation;  Surgeon: [name], MD;  Location: [facility];  Service: Endoscopy;  Laterality: N/A;   HIP SURGERY Left 10/2016   left hip tendon repair   LEFT HEART CATH AND CORONARY ANGIOGRAPHY N/A 11/27/2017   Procedure: LEFT HEART CATH AND CORONARY ANGIOGRAPHY;  Surgeon: Wellington Hampshire, MD;  Location: [facility];  Service: Cardiovascular;  Laterality: N/A;   LOWER EXTREMITY ANGIOGRAPHY Left 02/12/2018   Procedure: LOWER EXTREMITY ANGIOGRAPHY;  Surgeon: Algernon Huxley, MD;  Location: [facility];  Service: Cardiovascular;  Laterality: Left;   LOWER EXTREMITY ANGIOGRAPHY Left 03/07/2018   Procedure: LOWER EXTREMITY ANGIOGRAPHY;  Surgeon: Algernon Huxley, MD;  Location: [facility];  Service: Cardiovascular;  Laterality: Left;   LOWER EXTREMITY ANGIOGRAPHY Left 06/04/2018   Procedure: LOWER EXTREMITY ANGIOGRAPHY;  Surgeon: Algernon Huxley, MD;  Location: [facility];  Service: Cardiovascular;  Laterality: Left;   LOWER EXTREMITY ANGIOGRAPHY Left 09/17/2020   Procedure: LOWER EXTREMITY ANGIOGRAPHY;  Surgeon: Algernon Huxley, MD;  Location: [facility];  Service: Cardiovascular;  Laterality: Left;   LOWER EXTREMITY ANGIOGRAPHY Left 09/28/2020   Procedure: LOWER EXTREMITY ANGIOGRAPHY;  Surgeon: Algernon Huxley, MD;  Location: [facility];  Service: Cardiovascular;  Laterality: Left;   LOWER EXTREMITY ANGIOGRAPHY N/A 02/15/2021   Procedure: LOWER EXTREMITY ANGIOGRAPHY;  Surgeon: Algernon Huxley, MD;  Location: [facility];  Service: Cardiovascular;  Laterality: N/A;   PLACEMENT OF IMPELLA LEFT VENTRICULAR ASSIST DEVICE N/A 11/11/2019   Procedure: PLACEMENT OF IMPELLA LEFT VENTRICULAR ASSIST DEVICE 5.5;  Surgeon: Ivin Poot, MD;  Location: [facility];  Service: Open Heart Surgery;  Laterality: N/A;  Midline Sternotomy   POLYPECTOMY N/A 09/13/2018   Procedure: POLYPECTOMY;  Surgeon: [name], MD;  Location: [facility];  Service: Endoscopy;  Laterality: N/A;   POLYPECTOMY N/A 02/11/2019   Procedure: POLYPECTOMY;  Surgeon: [name], MD;  Location: [facility];  Service: Endoscopy;  Laterality: N/A;   REMOVAL OF IMPELLA LEFT VENTRICULAR ASSIST DEVICE N/A 11/15/2019   Procedure: REMOVAL OF IMPELLA 5.5 LEFT VENTRICULAR ASSIST DEVICE;  Surgeon: Ivin Poot, MD;  Location: [facility];  Service: Open Heart Surgery;  Laterality: N/A;   RIGHT/LEFT HEART CATH AND CORONARY ANGIOGRAPHY N/A 11/05/2019   Procedure: RIGHT/LEFT HEART CATH AND CORONARY ANGIOGRAPHY;  Surgeon: Nelva Bush, MD;  Location: [facility];  Service: Cardiovascular;  Laterality: N/A;   SPINE SURGERY     TEE WITHOUT CARDIOVERSION N/A 11/11/2019   Procedure: TRANSESOPHAGEAL ECHOCARDIOGRAM (TEE);  Surgeon: Prescott Gum, Collier Salina, MD;  Location: [facility];  Service: Open Heart Surgery;  Laterality: N/A;   TEE WITHOUT CARDIOVERSION N/A 11/15/2019    Procedure: TRANSESOPHAGEAL ECHOCARDIOGRAM (TEE);  Surgeon: Prescott Gum, Collier Salina, MD;  Location: [facility];  Service: Open Heart Surgery;  Laterality: N/A;   TONSILLECTOMY     Family History  Problem Relation Age of Onset   Heart attack Mother    Coronary artery disease Mother    Heart disease Mother        Carotid Stenosis and BPG and Heart Disease before age 33   Diabetes Mother    Hypertension Mother    Heart attack Father    Heart disease Father        BPG and Heart Disease before age 40   Hypertension Father    Cancer Father  4       throat   Stroke Father    Colon cancer Neg Hx    Colon polyps Neg Hx    Esophageal cancer Neg Hx    Rectal cancer Neg Hx    Stomach cancer Neg Hx    Social History   Socioeconomic History   Marital status: Married    Spouse name: Not on file   Number of children: 1   Years of education: Not on file   Highest education level: Bachelor's degree (e.g., BA, AB, BS)  Occupational History   Not on file  Tobacco Use   Smoking status: Former    Packs/day: 1.25    Years: 35.00    Total pack years: 43.75    Types: Cigarettes    Quit date: 02/28/2005    Years since quitting: 16.5   Smokeless tobacco: Current    Types: Snuff   Tobacco comments:    occaisionally  Vaping Use   Vaping Use: Never used  Substance and Sexual Activity   Alcohol use: Yes    Alcohol/week: 8.0 - 10.0 standard drinks of alcohol    Types: 8 - 10 Glasses of wine per week    Comment: weekly   Drug use: No   Sexual activity: Yes  Other Topics Concern   Not on file  Social History Narrative   Not on file   Social Determinants of Health   Financial Resource Strain: Low Risk  (09/23/2020)   Overall Financial Resource Strain (CARDIA)    Difficulty of Paying Living Expenses: Not hard at all  Food Insecurity: No Food Insecurity (09/27/2021)   Hunger Vital Sign    Worried About Running Out of Food in the Last Year: Never true    Ran Out of Food in the Last Year: Never true   Transportation Needs: No Transportation Needs (09/27/2021)   PRAPARE - Hydrologist (Medical): No    Lack of Transportation (Non-Medical): No  Recent Concern: Transportation Needs - Unmet Transportation Needs (09/27/2021)   PRAPARE - Transportation    Lack of Transportation (Medical): Yes    Lack of Transportation (Non-Medical): Yes  Physical Activity: Insufficiently Active (09/23/2020)   Exercise Vital Sign    Days of Exercise per Week: 2 days    Minutes of Exercise per Session: 60 min  Stress: No Stress Concern Present (09/23/2020)   [place]    Feeling of Stress : Not at all  Social Connections: Moderately Integrated (09/23/2020)   Social Connection and Isolation Panel [NHANES]    Frequency of Communication with Friends and Family: More than three times a week    Frequency of Social Gatherings with Friends and Family: Three times a week    Attends Religious Services: Never    Active Member of Clubs or Organizations: Yes    Attends Music therapist: More than 4 times per year    Marital Status: Married    Tobacco Counseling Ready to quit: Not Answered Counseling given: Not Answered Tobacco comments: occaisionally     How often do you need to have someone help you when you read instructions, pamphlets, or other written materials from your doctor or pharmacy?: (P) 1 - Never  Diabetic?No         Activities of Daily Living    09/27/2021    8:22 AM 09/22/2021    6:34 AM  In your present state of health, do you  have any difficulty performing the following activities:  Hearing? 0 0  Vision? 0 0  Difficulty concentrating or making decisions? 0 0  Walking or climbing stairs? 0 0  Dressing or bathing? 0 0  Doing errands, shopping? 0 0  Preparing Food and eating ?  N  Using the Toilet?  N  In the past six months, have you accidently leaked urine?  N  Do you have  problems with loss of bowel control?  N  Managing your Medications?  N  Managing your Finances?  N  Housekeeping or managing your Housekeeping?  N    Patient Care Team: Glean Hess, MD as PCP - General (Internal Medicine) Vickie Epley, MD as PCP - Electrophysiology (Cardiology) End, Harrell Gave, MD as PCP - Cardiology (Cardiology) Eulogio Bear, MD as Consulting Physician (Ophthalmology) Lucky Cowboy Erskine Squibb, MD as Referring Physician (Vascular Surgery) End, Harrell Gave, MD as Consulting Physician (Cardiology)  Indicate any recent Medical Services you may have received from other than [facility] providers in the past year (date may be approximate).     Assessment:   This is a routine wellness examination for [name].  Hearing/Vision screen No results found.  Dietary issues and exercise activities discussed:     Goals Addressed   None    Depression Screen    03/18/2021   10:20 AM 02/24/2021   11:29 AM 10/14/2020   11:09 AM 09/23/2020    8:54 AM 09/23/2019    8:23 AM 08/26/2019    3:06 PM 05/21/2019    8:01 AM  PHQ 2/9 Scores  PHQ - 2 Score 0 2 2 0 0 0 0  PHQ- 9 Score 0 2 2   0 0    Fall Risk    09/27/2021    8:23 AM 09/22/2021    6:34 AM 03/18/2021   10:20 AM 02/24/2021   11:29 AM 10/14/2020   11:08 AM  Fall Risk   Falls in the past year? 0 0 0 0 0  Number falls in past yr: 0 0 0 0 0  Injury with Fall? 0 0 0 0 0  Risk for fall due to :   No Fall Risks No Fall Risks No Fall Risks  Follow up   Falls evaluation completed Falls evaluation completed Falls evaluation completed    FALL RISK PREVENTION PERTAINING TO THE HOME:  Any stairs in or around the home? Yes  If so, are there any without handrails? No  Home free of loose throw rugs in walkways, pet beds, electrical cords, etc? No  Adequate lighting in your home to reduce risk of falls? Yes   ASSISTIVE DEVICES UTILIZED TO PREVENT FALLS:  Life alert? No  Use of a cane, walker or w/c? No  Grab bars in the  bathroom? Yes  Shower chair or bench in shower? Yes  Elevated toilet seat or a handicapped toilet? Yes     Cognitive Function:        09/27/2021    8:23 AM 09/12/2018    8:40 AM 09/06/2017    8:13 AM 07/13/2016    8:30 AM  6CIT Screen  What Year? 0 points 0 points 0 points 0 points  What month? 0 points 0 points 0 points 0 points  What time? 0 points 0 points 0 points   Count back from 20 2 points 0 points 0 points   Months in reverse 0 points 0 points 0 points   Repeat phrase 0 points 0 points 4 points  Total Score 2 points 0 points 4 points     Immunizations Immunization History  Administered Date(s) Administered   Fluad Quad(high Dose 65+) 11/23/2018   Influenza, High Dose Seasonal PF 12/23/2016, 12/12/2017, 12/21/2020   Influenza-Unspecified 12/15/2015, 12/23/2016   PFIZER Comirnaty(Gray Top)Covid-19 Tri-Sucrose Vaccine 04/09/2019, 04/30/2019, 12/02/2019   PFIZER(Purple Top)SARS-COV-2 Vaccination 04/09/2019, 04/30/2019   Pneumococcal Conjugate-13 06/15/2015   Pneumococcal Polysaccharide-23 01/06/2017   Tdap 04/18/2012   Zoster Recombinat (Shingrix) 01/17/2020, 04/22/2020    TDAP status: Up to date  Flu Vaccine status: Up to date  Pneumococcal vaccine status: Up to date  Discussed need for updated COVID booster, explained bivalent booster availability.  Qualifies for Shingles Vaccine? Yes    Shingrix Completed?: Yes  Screening Tests Health Maintenance  Topic Date Due   COVID-19 Vaccine (6 - Booster for Pfizer series) 01/27/2020   INFLUENZA VACCINE  09/28/2021   TETANUS/TDAP  04/18/2022   COLONOSCOPY (Pts 45-39yr Insurance coverage will need to be confirmed)  09/13/2023   Pneumonia Vaccine 72 Years old  Completed   Hepatitis C Screening  Completed   Zoster Vaccines- Shingrix  Completed   HPV VACCINES  Aged Out    Health Maintenance  Health Maintenance Due  Topic Date Due   COVID-19 Vaccine (6 - [place] 01/27/2020    Colorectal  cancer screening: Type of screening: Colonoscopy. Completed 2020. Repeat every 5 years   Lung Cancer Screening: (Low Dose CT Chest recommended if Age 72-80years, 30 pack-year currently smoking OR have quit w/in 15years.) does not qualify. Quit smoking 16 years ago. Last CT for lung cancer screening 2020   Additional Screening:  Hepatitis C Screening: does qualify; Completed 05/21/19  Vision Screening: Recommended annual ophthalmology exams for early detection of glaucoma and other disorders of the eye. Is the patient up to date with their annual eye exam?  No  Who is the provider or what is the name of the office in which the patient attends annual eye exams? Had cataract surgery 2 years ago, no f/u since If pt is not established with a provider, would they like to be referred to a provider to establish care? No .   Dental Screening: Recommended annual dental exams for proper oral hygiene  Community Resource Referral / Chronic Care Management: CRR required this visit?  No   CCM required this visit?  No      Plan:     Assessment & Plan   Annual wellness visit done today including the all of the following: Reviewed patient's Family Medical History Reviewed and updated list of patient's medical providers Assessment of cognitive impairment was done Assessed patient's functional ability Established a written schedule for health screening sColbyCompleted and Reviewed   I have personally reviewed and noted the following in the patient's chart:   Medical and social history Use of alcohol, tobacco or illicit drugs  Current medications and supplements including opioid prescriptions.  Functional ability and status Nutritional status Physical activity Advanced directives List of other physicians Hospitalizations, surgeries, and ER visits in previous 12 months Vitals Screenings to include cognitive, depression, and falls Referrals and appointments  In  addition, I have reviewed and discussed with patient certain preventive protocols, quality metrics, and best practice recommendations. A written personalized care plan for preventive services as well as general preventive health recommendations were provided to patient.     ACarvel Getting NP   09/27/2021   Nurse Notes:  Patient's only concerns are that  his col[facility]icine refill [facility] of [facility] preferred [facility] at [facility] and his left hip causes him pain and he is only able to walk short distances and not able to exercise.  He reports he plays golf 4 times a week but has to ride in a cart in order to play.  We discussed low impact exercise such as walking in a swimming pool.  Patient [place] access [place] swimming [place].   Note: I erroneously entered pt has unmet transportation needs. I have tried to undo this without success. He does not have transportation issues.   I spent 25 minutes with patient for this annual wellness visit

## 2021-09-27 NOTE — Patient Instructions (Addendum)
It was a pleasure to speak with you this morning!   Health Maintenance, Male Adopting a healthy lifestyle and getting preventive care are important in promoting health and wellness. Ask your health care provider about: The right schedule for you to have regular tests and exams. Things you can do on your own to prevent diseases and keep yourself healthy. What should I know about diet, weight, and exercise? Eat a healthy diet  Eat a diet that includes plenty of vegetables, fruits, low-fat dairy products, and lean protein. Do not eat a lot of foods that are high in solid fats, added sugars, or sodium. Maintain a healthy weight Body mass index (BMI) is a measurement that can be used to identify possible weight problems. It estimates body fat based on height and weight. Your health care provider can help determine your BMI and help you achieve or maintain a healthy weight. Get regular exercise Get regular exercise. This is one of the most important things you can do for your health. Most adults should: Exercise for at least 150 minutes each week. The exercise should increase your heart rate and make you sweat (moderate-intensity exercise). Do strengthening exercises at least twice a week. This is in addition to the moderate-intensity exercise. Spend less time sitting. Even light physical activity can be beneficial. Watch cholesterol and blood lipids Have your blood tested for lipids and cholesterol at 72 years of age, then have this test every 5 years. You may need to have your cholesterol levels checked more often if: Your lipid or cholesterol levels are high. You are older than 72 years of age. You are at high risk for heart disease. What should I know about cancer screening? Many types of cancers can be detected early and may often be prevented. Depending on your health history and family history, you may need to have cancer screening at various ages. This may include screening  for: Colorectal cancer. Prostate cancer. Skin cancer. Lung cancer. What should I know about heart disease, diabetes, and high blood pressure? Blood pressure and heart disease High blood pressure causes heart disease and increases the risk of stroke. This is more likely to develop in people who have high blood pressure readings or are overweight. Talk with your health care provider about your target blood pressure readings. Have your blood pressure checked: Every 3-5 years if you are 77-5 years of age. Every year if you are 69 years old or older. If you are between the ages of 74 and 86 and are a current or former smoker, ask your health care provider if you should have a one-time screening for abdominal aortic aneurysm (AAA). Diabetes Have regular diabetes screenings. This checks your fasting blood sugar level. Have the screening done: Once every three years after age 89 if you are at a normal weight and have a low risk for diabetes. More often and at a younger age if you are overweight or have a high risk for diabetes. What should I know about preventing infection? Hepatitis B If you have a higher risk for hepatitis B, you should be screened for this virus. Talk with your health care provider to find out if you are at risk for hepatitis B infection. Hepatitis C Blood testing is recommended for: Everyone born from [birth year] through 1966. Anyone with known risk factors for hepatitis C. Sexually transmitted infections (STIs) You should be screened each year for STIs, including gonorrhea and chlamydia, if: You are sexually active and are younger than 72 years  of age. You are older than 72 years of age and your health care provider tells you that you are at risk for this type of infection. Your sexual activity has changed since you were last screened, and you are at increased risk for chlamydia or gonorrhea. Ask your health care provider if you are at risk. Ask your health care provider about  whether you are at high risk for HIV. Your health care provider may recommend a prescription medicine to help prevent HIV infection. If you choose to take medicine to prevent HIV, you should first get tested for HIV. You should then be tested every 3 months for as [place] as you are taking the medicine. Follow these instructions at home: Alcohol use Do not drink alcohol if your health care provider tells you not to drink. If you drink alcohol: Limit how much you have to 0-2 drinks a day. Know how much alcohol is in your drink. In the [place] one drink equals one 12 oz bottle of beer (355 mL), one 5 oz glass of wine (148 mL), or one 1 oz glass of hard liquor (44 mL). Lifestyle Do not use any products that contain nicotine or tobacco. These products include cigarettes, chewing tobacco, and vaping devices, such as e-cigarettes. If you need help quitting, ask your health care provider. Do not use street drugs. Do not share needles. Ask your health care provider for help if you need support or information about quitting drugs. General instructions Schedule regular health, dental, and eye exams. Stay current with your vaccines. Tell your health care provider if: You often feel depressed. You have ever been abused or do not feel safe at home. Summary Adopting a healthy lifestyle and getting preventive care are important in promoting health and wellness. Follow your health care provider's instructions about healthy diet, exercising, and getting tested or screened for diseases. Follow your health care provider's instructions on monitoring your cholesterol and blood pressure. This information is not intended to replace advice given to you by your health care provider. Make sure you discuss any questions you have with your health care provider. Document Revised: 07/06/2020 Document Reviewed: 07/06/2020 Elsevier Patient Education  [place] Maintenance, Male Adopting a healthy lifestyle  and getting preventive care are important in promoting health and wellness. Ask your health care provider about: The right schedule for you to have regular tests and exams. Things you can do on your own to prevent diseases and keep yourself healthy. What should I know about diet, weight, and exercise? Eat a healthy diet  Eat a diet that includes plenty of vegetables, fruits, low-fat dairy products, and lean protein. Do not eat a lot of foods that are high in solid fats, added sugars, or sodium. Maintain a healthy weight Body mass index (BMI) is a measurement that can be used to identify possible weight problems. It estimates body fat based on height and weight. Your health care provider can help determine your BMI and help you achieve or maintain a healthy weight. Get regular exercise Get regular exercise. This is one of the most important things you can do for your health. Most adults should: Exercise for at least 150 minutes each week. The exercise should increase your heart rate and make you sweat (moderate-intensity exercise). Do strengthening exercises at least twice a week. This is in addition to the moderate-intensity exercise. Spend less time sitting. Even light physical activity can be beneficial. Watch cholesterol and blood lipids Have your blood tested  for lipids and cholesterol at 72 years of age, then have this test every 5 years. You may need to have your cholesterol levels checked more often if: Your lipid or cholesterol levels are high. You are older than 72 years of age. You are at high risk for heart disease. What should I know about cancer screening? Many types of cancers can be detected early and may often be prevented. Depending on your health history and family history, you may need to have cancer screening at various ages. This may include screening for: Colorectal cancer. Prostate cancer. Skin cancer. Lung cancer. What should I know about heart disease, diabetes,  and high blood pressure? Blood pressure and heart disease High blood pressure causes heart disease and increases the risk of stroke. This is more likely to develop in people who have high blood pressure readings or are overweight. Talk with your health care provider about your target blood pressure readings. Have your blood pressure checked: Every 3-5 years if you are 14-70 years of age. Every year if you are 56 years old or older. If you are between the ages of 72 and 42 and are a current or former smoker, ask your health care provider if you should have a one-time screening for abdominal aortic aneurysm (AAA). Diabetes Have regular diabetes screenings. This checks your fasting blood sugar level. Have the screening done: Once every three years after age 24 if you are at a normal weight and have a low risk for diabetes. More often and at a younger age if you are overweight or have a high risk for diabetes. What should I know about preventing infection? Hepatitis B If you have a higher risk for hepatitis B, you should be screened for this virus. Talk with your health care provider to find out if you are at risk for hepatitis B infection. Hepatitis C Blood testing is recommended for: Everyone born from [birth year] through 1966. Anyone with known risk factors for hepatitis C. Sexually transmitted infections (STIs) You should be screened each year for STIs, including gonorrhea and chlamydia, if: You are sexually active and are younger than 72 years of age. You are older than 72 years of age and your health care provider tells you that you are at risk for this type of infection. Your sexual activity has changed since you were last screened, and you are at increased risk for chlamydia or gonorrhea. Ask your health care provider if you are at risk. Ask your health care provider about whether you are at high risk for HIV. Your health care provider may recommend a prescription medicine to help prevent HIV  infection. If you choose to take medicine to prevent HIV, you should first get tested for HIV. You should then be tested every 3 months for as [place] as you are taking the medicine. Follow these instructions at home: Alcohol use Do not drink alcohol if your health care provider tells you not to drink. If you drink alcohol: Limit how much you have to 0-2 drinks a day. Know how much alcohol is in your drink. In the [place] one drink equals one 12 oz bottle of beer (355 mL), one 5 oz glass of wine (148 mL), or one 1 oz glass of hard liquor (44 mL). Lifestyle Do not use any products that contain nicotine or tobacco. These products include cigarettes, chewing tobacco, and vaping devices, such as e-cigarettes. If you need help quitting, ask your health care provider. Do not use street drugs. Do not  share needles. Ask your health care provider for help if you need support or information about quitting drugs. General instructions Schedule regular health, dental, and eye exams. Stay current with your vaccines. Tell your health care provider if: You often feel depressed. You have ever been abused or do not feel safe at home. Summary Adopting a healthy lifestyle and getting preventive care are important in promoting health and wellness. Follow your health care provider's instructions about healthy diet, exercising, and getting tested or screened for diseases. Follow your health care provider's instructions on monitoring your cholesterol and blood pressure. This information is not intended to replace advice given to you by your health care provider. Make sure you discuss any questions you have with your health care provider. Document Revised: 07/06/2020 Document Reviewed: 07/06/2020 Elsevier Patient Education  [place].

## 2021-09-30 ENCOUNTER — Other Ambulatory Visit: Payer: Self-pay | Admitting: Internal Medicine

## 2021-09-30 DIAGNOSIS — M109 Gout, unspecified: Secondary | ICD-10-CM

## 2021-10-01 NOTE — Telephone Encounter (Signed)
Requested medication (s) are due for refill today: Filled for #30 09/22/21 (takes twice a day)  Requested medication (s) are on the active medication list: yes  Last refill:  09/22/21 #30 with 0 RF  Future visit scheduled: 03/22/22  Notes to clinic:  This med is twice a day, just checking if #30 was intentional, next appt 03/22/22, please assess.      Requested Prescriptions  Pending Prescriptions Disp Refills   colchicine 0.6 MG tablet [Pharmacy Med Name: COLCHICINE 0.6 MG TABLET] 30 tablet 0    Sig: TAKE 1 TABLET BY MOUTH 2 TIMES DAILY.     Endocrinology:  Gout Agents - colchicine Failed - 09/30/2021  2:31 PM      Failed - CBC within normal limits and completed in the last 12 months    WBC  Date Value Ref Range Status  02/16/2021 7.4 4.0 - 10.5 K/uL Final   RBC  Date Value Ref Range Status  02/16/2021 4.42 4.22 - 5.81 MIL/uL Final   Hemoglobin  Date Value Ref Range Status  02/16/2021 13.3 13.0 - 17.0 g/dL Final  12/04/2019 11.9 (L) 13.0 - 17.7 g/dL Final   Total hemoglobin  Date Value Ref Range Status  11/21/2019 11.3 (L) 12.0 - 16.0 g/dL Final   HCT  Date Value Ref Range Status  02/16/2021 40.3 39.0 - 52.0 % Final   Hematocrit  Date Value Ref Range Status  12/04/2019 35.3 (L) 37.5 - 51.0 % Final   MCHC  Date Value Ref Range Status  02/16/2021 33.0 30.0 - 36.0 g/dL Final   [facility]  Date Value Ref Range Status  02/16/2021 30.1 26.0 - 34.0 pg Final   MCV  Date Value Ref Range Status  02/16/2021 91.2 80.0 - 100.0 fL Final  12/04/2019 89 79 - 97 fL Final   No results found for: "PLTCOUNTKUC", "LABPLAT", "POCPLA" RDW  Date Value Ref Range Status  02/16/2021 14.6 11.5 - 15.5 % Final  12/04/2019 12.3 11.6 - 15.4 % Final         Passed - Cr in normal range and within 360 days    Creat  Date Value Ref Range Status  07/23/2015 1.12 0.70 - 1.25 mg/dL Final   Creatinine, Ser  Date Value Ref Range Status  08/18/2021 1.12 0.61 - 1.24 mg/dL Final         Passed -  ALT in normal range and within 360 days    ALT  Date Value Ref Range Status  08/18/2021 27 0 - 44 U/L Final         Passed - AST in normal range and within 360 days    AST  Date Value Ref Range Status  08/18/2021 24 15 - 41 U/L Final         Passed - Valid encounter within last 12 months    Recent Outpatient Visits           6 months ago Annual physical exam   [facility] Glean Hess, MD   7 months ago COVID-19 virus infection   [facility] Glean Hess, MD   11 months ago Gouty arthritis of great toe   [facility] Glean Hess, MD   2 years ago Impacted cerumen of left ear   [facility] Glean Hess, MD   2 years ago Annual physical exam   [facility] Glean Hess, MD       Future Appointments  In 5 months Army Melia, Jesse Sans, MD [place]

## 2021-10-04 ENCOUNTER — Other Ambulatory Visit (INDEPENDENT_AMBULATORY_CARE_PROVIDER_SITE_OTHER): Payer: Self-pay | Admitting: Vascular Surgery

## 2021-10-04 DIAGNOSIS — I739 Peripheral vascular disease, unspecified: Secondary | ICD-10-CM

## 2021-10-05 ENCOUNTER — Ambulatory Visit (INDEPENDENT_AMBULATORY_CARE_PROVIDER_SITE_OTHER): Payer: PPO | Admitting: Vascular Surgery

## 2021-10-05 ENCOUNTER — Encounter (INDEPENDENT_AMBULATORY_CARE_PROVIDER_SITE_OTHER): Payer: Self-pay | Admitting: Vascular Surgery

## 2021-10-05 ENCOUNTER — Ambulatory Visit (INDEPENDENT_AMBULATORY_CARE_PROVIDER_SITE_OTHER): Payer: PPO

## 2021-10-05 VITALS — BP 134/65 | HR 71 | Resp 16 | Wt 180.0 lb

## 2021-10-05 DIAGNOSIS — I739 Peripheral vascular disease, unspecified: Secondary | ICD-10-CM

## 2021-10-05 DIAGNOSIS — I70229 Atherosclerosis of native arteries of extremities with rest pain, unspecified extremity: Secondary | ICD-10-CM | POA: Diagnosis not present

## 2021-10-05 DIAGNOSIS — I10 Essential (primary) hypertension: Secondary | ICD-10-CM | POA: Diagnosis not present

## 2021-10-05 DIAGNOSIS — E785 Hyperlipidemia, unspecified: Secondary | ICD-10-CM | POA: Diagnosis not present

## 2021-10-05 NOTE — Progress Notes (Signed)
MRN : [id number]  [name] is a 71 y.o. (1950-10-17) male who presents with chief complaint of  Chief Complaint  Patient presents with   Follow-up    Ultrasound follow up  .  History of Present Illness: Patient returns today in follow up of his PAD.  He is doing well.  About a year ago, he underwent extensive revascularization with femoral endarterectomies and infrainguinal interventions.  This has resulted in marked improvement clinically.  He is playing golf and doing well now.  No new complaints.  His legs really only give him mild difficulty with extensive activity.  He is tolerating anticoagulation with Eliquis without any bleeding issues.  He is also taking a baby aspirin.  He has failed multiple statin agents. ABIs today are 1.0 on the right and 0.98 on the left.  Duplex does not show any obvious hemodynamically significant stenosis.  Current Outpatient Medications  Medication Sig Dispense Refill   amiodarone (PACERONE) 200 MG tablet Take 1 tablet (200 mg total) by mouth 2 (two) times daily for 5 days, THEN 1 tablet (200 mg total) daily. 90 tablet 3   aspirin EC 81 MG tablet Take 1 tablet (81 mg total) by mouth daily. 150 tablet 2   carvedilol (COREG) 12.5 MG tablet Take 1 tablet (12.5 mg total) by mouth 2 (two) times daily. 180 tablet 0   colchicine 0.6 MG tablet TAKE 1 TABLET BY MOUTH 2 TIMES DAILY. 30 tablet 0   ELIQUIS 5 MG TABS tablet TAKE 1 TABLET BY MOUTH TWICE A DAY 180 tablet 5   nitroGLYCERIN (NITROSTAT) 0.4 MG SL tablet Place 1 tablet (0.4 mg total) under the tongue every 5 (five) minutes as needed for chest pain. 25 tablet 2   PRALUENT 150 MG/ML SOAJ Inject 150 mg into the skin every 14 (fourteen) days.     sacubitril-valsartan (ENTRESTO) 49-51 MG Take 1 tablet by mouth 2 (two) times daily. Need to make an appointment for further refills 180 tablet 3   traZODone (DESYREL) 50 MG tablet Take 1 tablet (50 mg total) by mouth at bedtime as needed. for sleep 90 tablet 1    No current facility-administered medications for this visit.    Past Medical History:  Diagnosis Date   Abnormal nuclear cardiac imaging test 08/08/2015   Arthritis    fingers   Carotid artery occlusion    CHF (congestive heart failure) (HCC)    Coronary atherosclerosis of native coronary artery 01/29/2013   11/05/19 R/LHC 80% dLMCA stenosis small diffusely dz dLAD, chronically occluded OM1 50%mRCA lesion, widely patent mLCx strent, moderately elevated L heart filling pressures, mild to moderate RH filling pressures, normal to moderately reduced CO   Duodenal erosion    Encounter for screening for lung cancer 07/13/2016   Esophageal stenosis    esophageal dilation   GERD (gastroesophageal reflux disease)    H. pylori infection    Heart attack ([place]) Oct. 2009   Mild   Hiatal hernia    Hyperlipidemia    Hypertension    Old myocardial infarction 11/29/2007   Mildly elevated troponin, isolated value in October 2009. Cardiac catheterization-nonobstructive 60% RCA disease-subsequent nuclear stress test-9 minutes, low risk, mild inferior wall hypokinesis    Pain in limb 12/19/2017   Peripheral vascular disease ([place])    Unstable angina ([place]) 11/25/2017    Past Surgical History:  Procedure Laterality Date   APPENDECTOMY     BACK SURGERY     BIV ICD INSERTION CRT-D N/A  03/23/2020   Procedure: BIV ICD INSERTION CRT-D;  Surgeon: Vickie Epley, MD;  Location: [place] [facility];  Service: Cardiovascular;  Laterality: N/A;   CARDIAC CATHETERIZATION N/A 08/07/2015   Procedure: Left Heart Cath and Coronary Angiography;  Surgeon: [name], MD;  Location: [place] [facility];  Service: Cardiovascular;  Laterality: N/A;   CARDIAC CATHETERIZATION N/A 08/07/2015   Procedure: Coronary Stent Intervention;  Surgeon: [name], MD;  Location: [place] [facility];  Service: Cardiovascular;  Laterality: N/A;   CARDIAC CATHETERIZATION N/A 08/07/2015   Procedure: Coronary Stent Intervention;  Surgeon:  [name], MD;  Location: [place] [facility];  Service: Cardiovascular;  Laterality: N/A;   CAROTID ENDARTERECTOMY  01/05/2006   Right  CEA with DPA   CATARACT EXTRACTION W/ INTRAOCULAR LENS IMPLANT Left 12/04/2017   CATARACT EXTRACTION W/PHACO Left 12/04/2017   Procedure: CATARACT EXTRACTION PHACO AND INTRAOCULAR LENS PLACEMENT ([place]) LEFT;  Surgeon: Eulogio Bear, MD;  Location: [place];  Service: Ophthalmology;  Laterality: Left;   CATARACT EXTRACTION W/PHACO Right 02/06/2018   Procedure: CATARACT EXTRACTION PHACO AND INTRAOCULAR LENS PLACEMENT (I[facility])RIGHT;  Surgeon: Eulogio Bear, MD;  Location: [place];  Service: Ophthalmology;  Laterality: Right;   COLONOSCOPY  05/20/2008   COLONOSCOPY WITH PROPOFOL N/A 09/13/2018   Procedure: COLONOSCOPY WITH BIOPSY;  Surgeon: [name], MD;  Location: [place];  Service: Endoscopy;  Laterality: N/A;   CORONARY ARTERY BYPASS GRAFT N/A 11/11/2019   Procedure: CORONARY ARTERY BYPASS GRAFTING (CABG) USING LIMA to Diag1; ENDOSCOPICALLY HARVESTED RIGHT GREATER SAPHENOUS VEIN: SVG to OM1; SVG to OM2; SVG to PDA.;  Surgeon: Ivin Poot, MD;  Location: [place];  Service: Open Heart Surgery;  Laterality: N/A;   CORONARY STENT PLACEMENT  08/07/2015   MID CIRCUMFLEX   ENDARTERECTOMY FEMORAL Bilateral 09/30/2020   Procedure: ENDARTERECTOMY FEMORAL;  Surgeon: Algernon Huxley, MD;  Location: [facility];  Service: Vascular;  Laterality: Bilateral;   ENDOVEIN HARVEST OF GREATER SAPHENOUS VEIN Right 11/11/2019   Procedure: ENDOVEIN HARVEST OF GREATER SAPHENOUS VEIN;  Surgeon: Ivin Poot, MD;  Location: [place];  Service: Open Heart Surgery;  Laterality: Right;   ESOPHAGOGASTRODUODENOSCOPY (EGD) WITH PROPOFOL N/A 02/11/2019   Procedure: ESOPHAGOGASTRODUODENOSCOPY (EGD) WITH BIOPSY and  Dilation;  Surgeon: [name], MD;  Location: [place];  Service: Endoscopy;  Laterality: N/A;   HIP SURGERY Left 10/2016   left  hip tendon repair   LEFT HEART CATH AND CORONARY ANGIOGRAPHY N/A 11/27/2017   Procedure: LEFT HEART CATH AND CORONARY ANGIOGRAPHY;  Surgeon: Wellington Hampshire, MD;  Location: [place] [facility];  Service: Cardiovascular;  Laterality: N/A;   LOWER EXTREMITY ANGIOGRAPHY Left 02/12/2018   Procedure: LOWER EXTREMITY ANGIOGRAPHY;  Surgeon: Algernon Huxley, MD;  Location: [place] [facility];  Service: Cardiovascular;  Laterality: Left;   LOWER EXTREMITY ANGIOGRAPHY Left 03/07/2018   Procedure: LOWER EXTREMITY ANGIOGRAPHY;  Surgeon: Algernon Huxley, MD;  Location: [place] [facility];  Service: Cardiovascular;  Laterality: Left;   LOWER EXTREMITY ANGIOGRAPHY Left 06/04/2018   Procedure: LOWER EXTREMITY ANGIOGRAPHY;  Surgeon: Algernon Huxley, MD;  Location: [place] [facility];  Service: Cardiovascular;  Laterality: Left;   LOWER EXTREMITY ANGIOGRAPHY Left 09/17/2020   Procedure: LOWER EXTREMITY ANGIOGRAPHY;  Surgeon: Algernon Huxley, MD;  Location: [place] [facility];  Service: Cardiovascular;  Laterality: Left;   LOWER EXTREMITY ANGIOGRAPHY Left 09/28/2020   Procedure: LOWER EXTREMITY ANGIOGRAPHY;  Surgeon: Lucky Cowboy,  Erskine Squibb, MD;  Location: [place] [facility];  Service: Cardiovascular;  Laterality: Left;   LOWER EXTREMITY ANGIOGRAPHY N/A 02/15/2021   Procedure: LOWER EXTREMITY ANGIOGRAPHY;  Surgeon: Algernon Huxley, MD;  Location: [place] [facility];  Service: Cardiovascular;  Laterality: N/A;   PLACEMENT OF IMPELLA LEFT VENTRICULAR ASSIST DEVICE N/A 11/11/2019   Procedure: PLACEMENT OF IMPELLA LEFT VENTRICULAR ASSIST DEVICE 5.5;  Surgeon: Ivin Poot, MD;  Location: [place];  Service: Open Heart Surgery;  Laterality: N/A;  Midline Sternotomy   POLYPECTOMY N/A 09/13/2018   Procedure: POLYPECTOMY;  Surgeon: [name], MD;  Location: [place];  Service: Endoscopy;  Laterality: N/A;   POLYPECTOMY N/A 02/11/2019   Procedure: POLYPECTOMY;  Surgeon: [name], MD;  Location: [place];  Service:  Endoscopy;  Laterality: N/A;   REMOVAL OF IMPELLA LEFT VENTRICULAR ASSIST DEVICE N/A 11/15/2019   Procedure: REMOVAL OF IMPELLA 5.5 LEFT VENTRICULAR ASSIST DEVICE;  Surgeon: Ivin Poot, MD;  Location: [place];  Service: Open Heart Surgery;  Laterality: N/A;   RIGHT/LEFT HEART CATH AND CORONARY ANGIOGRAPHY N/A 11/05/2019   Procedure: RIGHT/LEFT HEART CATH AND CORONARY ANGIOGRAPHY;  Surgeon: Nelva Bush, MD;  Location: [place] [facility];  Service: Cardiovascular;  Laterality: N/A;   SPINE SURGERY     TEE WITHOUT CARDIOVERSION N/A 11/11/2019   Procedure: TRANSESOPHAGEAL ECHOCARDIOGRAM (TEE);  Surgeon: Prescott Gum, Collier Salina, MD;  Location: [place];  Service: Open Heart Surgery;  Laterality: N/A;   TEE WITHOUT CARDIOVERSION N/A 11/15/2019   Procedure: TRANSESOPHAGEAL ECHOCARDIOGRAM (TEE);  Surgeon: Prescott Gum, Collier Salina, MD;  Location: [place];  Service: Open Heart Surgery;  Laterality: N/A;   TONSILLECTOMY       Social History   Tobacco Use   Smoking status: Former    Packs/day: 1.25    Years: 35.00    Total pack years: 43.75    Types: Cigarettes    Quit date: 02/28/2005    Years since quitting: 16.6   Smokeless tobacco: Current    Types: Snuff   Tobacco comments:    occaisionally  Vaping Use   Vaping Use: Never used  Substance Use Topics   Alcohol use: Yes    Alcohol/week: 8.0 - 10.0 standard drinks of alcohol    Types: 8 - 10 Glasses of wine per week    Comment: weekly   Drug use: No      Family History  Problem Relation Age of Onset   Heart attack Mother    Coronary artery disease Mother    Heart disease Mother        Carotid Stenosis and BPG and Heart Disease before age 51   Diabetes Mother    Hypertension Mother    Heart attack Father    Heart disease Father        BPG and Heart Disease before age 15   Hypertension Father    Cancer Father 11       throat   Stroke Father    Colon cancer Neg Hx    Colon polyps Neg Hx    Esophageal cancer Neg Hx    Rectal cancer Neg Hx     Stomach cancer Neg Hx      Allergies  Allergen Reactions   Brilinta [Ticagrelor] Shortness Of Breath   Chlorhexidine Gluconate Other (See Comments)    Skin burning for hours afterward   Contrast Media [Iodinated Contrast Media] Itching    Face and head flushing, nose itching after contrast administation for  angiogram   Statins Other (See Comments)    Failed Crestor 5 mg twice weekly, Crestor 20 mg daily, Pravastatin 40 mg qd, Lipitor, Zocor - muscle aches   Zetia [Ezetimibe] Other (See Comments)    Muscle aches    REVIEW OF SYSTEMS (Negative unless checked)   Constitutional: []Weight loss  []Fever  []Chills Cardiac: []Chest pain   []Chest pressure   []Palpitations   []Shortness of breath when laying flat   []Shortness of breath at rest   []Shortness of breath with exertion. Vascular:  [x]Pain in legs with walking   []Pain in legs at rest   []Pain in legs when laying flat   []Claudication   []Pain in feet when walking  []Pain in feet at rest  []Pain in feet when laying flat   []History of DVT   []Phlebitis   []Swelling in legs   []Varicose veins   []Non-healing ulcers Pulmonary:   []Uses home oxygen   []Productive cough   []Hemoptysis   []Wheeze  []COPD   []Asthma Neurologic:  []Dizziness  []Blackouts   []Seizures   []History of stroke   []History of TIA  []Aphasia   []Temporary blindness   []Dysphagia   []Weakness or numbness in arms   [x]Weakness or numbness in legs Musculoskeletal:  [x]Arthritis   []Joint swelling   []Joint pain   []Low back pain Hematologic:  []Easy bruising  []Easy bleeding   []Hypercoagulable state   []Anemic   Gastrointestinal:  []Blood in stool   []Vomiting blood  []Gastroesophageal reflux/heartburn   []Abdominal pain Genitourinary:  []Chronic kidney disease   []Difficult urination  []Frequent urination  []Burning with urination   []Hematuria Skin:  []Rashes   []Ulcers   []Wounds Psychological:  []History of anxiety   [] History of major  depression.   Physical Examination  BP 134/65 (BP Location: Right Arm)   Pulse 71   Resp 16   Wt 180 lb (81.6 kg)   BMI 29.05 kg/m  Gen:  WD/WN, NAD Head: [place]/AT, No temporalis wasting. Ear/Nose/Throat: Hearing grossly intact, nares w/o erythema or drainage Eyes: Conjunctiva clear. Sclera non-icteric Neck: Supple.  Trachea midline Pulmonary:  Good air movement, no use of accessory muscles.  Cardiac: RRR, no JVD Vascular:  Vessel Right Left  Radial Palpable Palpable                          PT Palpable Palpable  DP Palpable Palpable   Gastrointestinal: soft, non-tender/non-distended. No guarding/reflex.  Musculoskeletal: M/S 5/5 throughout.  No deformity or atrophy.  No significant lower extremity edema. Neurologic: Sensation grossly intact in extremities.  Symmetrical.  Speech is fluent.  Psychiatric: Judgment intact, Mood & affect appropriate for pt's clinical situation. Dermatologic: No rashes or ulcers noted.  No cellulitis or open wounds.      Labs Recent Results (from the past 2160 hour(s))  CUP PACEART INCLINIC DEVICE CHECK     Status: None   Collection Time: 08/18/21  9:25 AM  Result Value Ref Range   Date Time Interrogation Session [phone]    Pulse Generator Manufacturer OTHER    Pulse Gen Model CDHFA500Q Gallant HF    Pulse Gen Serial Number [id number]    Clinic Name [facility]    Implantable Pulse Generator Type Cardiac Resynch Therapy Defibulator    Implantable Pulse Generator Implant Date [id number]    Implantable Lead Manufacturer [facility]    Implantable Lead Model 1458Q Quartet    Implantable Lead Serial  Number [id number]    Implantable Lead Implant Date [id number]    Implantable Lead Location Detail 1 UNKNOWN    Implantable Lead Location [phone]    Implantable Lead Manufacturer [facility]    Implantable Lead Model 7122Q Durata SJ4    Implantable Lead Serial Number [id number]    Implantable Lead Implant Date [id number]    Implantable Lead Location  Detail 1 UNKNOWN    Implantable Lead Location [phone]    Implantable Lead Manufacturer [facility]    Implantable Lead Model LPA1200M Tendril MRI    Implantable Lead Serial Number [id number]    Implantable Lead Implant Date [id number]    Implantable Lead Location Detail 1 UNKNOWN    Implantable Lead Location [phone]    Lead Channel Setting Sensing Sensitivity 0.5 mV   Lead Channel Setting Pacing Amplitude 2.0 V   Lead Channel Setting Pacing Pulse Width 0.5 ms   Lead Channel Setting Pacing Amplitude 1.75 V   Lead Channel Setting Pacing Pulse Width 0.5 ms   Lead Channel Setting Pacing Amplitude 2.25 V   Lead Channel Setting Pacing Capture Mode Adaptive Capture    Lead Channel Impedance Value 462.5 ohm   Lead Channel Sensing Intrinsic Amplitude 2.6 mV   Lead Channel Impedance Value 475.0 ohm   Lead Channel Sensing Intrinsic Amplitude 12.0 mV   Lead Channel Pacing Threshold Amplitude 0.75 V   Lead Channel Pacing Threshold Pulse Width 0.5 ms   HighPow Impedance 60.75 ohm   Lead Channel Impedance Value 562.5 ohm   Lead Channel Pacing Threshold Amplitude 1.75 V   Lead Channel Pacing Threshold Pulse Width 0.5 ms   Lead Channel Pacing Threshold Amplitude 2.25 V   Lead Channel Pacing Threshold Pulse Width 0.5 ms   Battery Remaining Longevity 55 mo   [employer] RA Percent Paced 44.0 %   Brady Statistic RV Percent Paced 83.0 %   Eval Rhythm SR   T4, free     Status: None   Collection Time: 08/18/21  9:42 AM  Result Value Ref Range   Free T4 0.94 0.61 - 1.12 ng/dL    Comment: (NOTE) Biotin ingestion may interfere with free T4 tests. If the results are inconsistent with the TSH level, previous test results, or the clinical presentation, then consider biotin interference. If needed, order repeat testing after stopping biotin. Performed at [facility], [address] [place], [place] [address]   TSH     Status: None   Collection Time: 08/18/21  9:42 AM  Result Value Ref Range   TSH  1.504 0.350 - 4.500 uIU/mL    Comment: Performed by a 3rd Generation assay with a functional sensitivity of <=0.01 uIU/mL. Performed at [facility], [address] [place], [place] [address]   Comp Met (CMET)     Status: Abnormal   Collection Time: 08/18/21  9:42 AM  Result Value Ref Range   Sodium 139 135 - 145 mmol/L   Potassium 5.0 3.5 - 5.1 mmol/L   Chloride 108 98 - 111 mmol/L   CO2 24 22 - 32 mmol/L   Glucose, Bld 112 (H) 70 - 99 mg/dL    Comment: Glucose reference range applies only to samples taken after fasting for at least 8 hours.   BUN 36 (H) 8 - 23 mg/dL   Creatinine, Ser 1.12 0.61 - 1.24 mg/dL   Calcium 9.4 8.9 - 10.3 mg/dL   Total Protein 7.6 6.5 - 8.1 g/dL   Albumin 4.2 3.5 - 5.0 g/dL  AST 24 15 - 41 U/L   ALT 27 0 - 44 U/L   Alkaline Phosphatase 77 38 - 126 U/L   Total Bilirubin 0.6 0.3 - 1.2 mg/dL   GFR, Estimated >60 >60 mL/min    Comment: (NOTE) Calculated using the CKD-EPI Creatinine Equation (2021)    Anion gap 7 5 - 15    Comment: Performed at [facility], [address], [place], [place] [address]  Pacemaker Device Observation     Status: None   Collection Time: 08/18/21 10:28 AM  Result Value Ref Range   PACEART PDF     DEVICE MODEL PM     BATTERY VOLTAGE    CUP PACEART REMOTE DEVICE CHECK     Status: None   Collection Time: 09/22/21 10:41 AM  Result Value Ref Range   Date Time Interrogation Session [id number]    Pulse Generator Manufacturer OTHER    Pulse Gen Model CDHFA500Q Gallant HF    Pulse Gen Serial Number [id number]    Clinic Name [facility]    Implantable Pulse Generator Type Cardiac Resynch Therapy Defibulator    Implantable Pulse Generator Implant Date [id number]    Implantable Lead Manufacturer [facility]    Implantable Lead Model 1458Q Quartet    Implantable Lead Serial Number [id number]    Implantable Lead Implant Date [id number]    Implantable Lead Location Detail 1 UNKNOWN    Implantable Lead Location [id number]     Implantable Lead Manufacturer [facility]    Implantable Lead Model [id number] Durata SJ4    Implantable Lead Serial Number [id number]    Implantable Lead Implant Date [id number]    Implantable Lead Location Detail 1 UNKNOWN    Implantable Lead Location [id number]    Implantable Lead Manufacturer [employer]    Implantable Lead Model LPA1200M Tendril MRI    Implantable Lead Serial Number [id number]    Implantable Lead Implant Date [id number]    Implantable Lead Location Detail 1 UNKNOWN    Implantable Lead Location [id number]    Lead Channel Setting Sensing Sensitivity 0.5 mV   Lead Channel Setting Sensing Adaptation Mode Adaptive Sensing    Lead Channel Setting Pacing Amplitude 2.0 V   Lead Channel Setting Pacing Pulse Width 0.5 ms   Lead Channel Setting Pacing Amplitude 1.75 V   Lead Channel Setting Pacing Pulse Width 0.5 ms   Lead Channel Setting Pacing Amplitude 2.375    Lead Channel Setting Pacing Capture Mode Adaptive Capture    Lead Channel Status NULL    Lead Channel Impedance Value 510 ohm   Lead Channel Pacing Threshold Amplitude 1.875 V   Lead Channel Pacing Threshold Pulse Width 0.5 ms   Lead Channel Status NULL    Lead Channel Impedance Value 460 ohm   Lead Channel Sensing Intrinsic Amplitude 2.2 mV   Lead Channel Pacing Threshold Amplitude 0.75 V   Lead Channel Pacing Threshold Pulse Width 0.5 ms   Lead Channel Status NULL    Lead Channel Impedance Value 490 ohm   Lead Channel Sensing Intrinsic Amplitude 12.0 mV   Lead Channel Pacing Threshold Amplitude 0.75 V   Lead Channel Pacing Threshold Pulse Width 0.5 ms   HighPow Impedance 57 ohm   HighPow Imped Status NULL    Battery Status MOS    Battery Remaining Longevity 53 mo   Battery Remaining Percentage 75.0 %   Battery Voltage 2.98 V   Brady Statistic RA Percent Paced 70.0 %   Brady Statistic AP VP Percent 73.0 %  Brady Statistic AS VP Percent 15.0 %   Brady Statistic AP VS Percent 1.7 %   Brady Statistic AS VS Percent 1.0 %     Radiology CUP PACEART REMOTE DEVICE CHECK  Result Date: 09/22/2021 Scheduled remote reviewed. Normal device function.  1 NSVT, no therapy Sub optimal BiV pacing, 88%, consistent with trends Next remote 91 days. LA   Assessment/Plan Essential hypertension blood pressure control important in reducing the progression of atherosclerotic disease. On appropriate oral medications.     HLD (hyperlipidemia) lipid control important in reducing the progression of atherosclerotic disease. Continue statin therapy  Atherosclerosis of artery of extremity with rest pain (HCC) ABIs today are 1.0 on the right and 0.98 on the left.  Duplex does not show any obvious hemodynamically significant stenosis.  Patient clinically doing well and back to normal activities without restrictions.  Continue current medical regimen.  With his multiple previous interventions, surgeries, and recurrent ischemic events, I am going to keep him on short interval follow-ups of 6 months rather than go annually [place]-term.    [name], MD  10/05/2021 9:35 AM    This note was created with Dragon medical transcription system.  Any errors from dictation are purely unintentional

## 2021-10-05 NOTE — Assessment & Plan Note (Signed)
ABIs today are 1.0 on the right and 0.98 on the left.  Duplex does not show any obvious hemodynamically significant stenosis.  Patient clinically doing well and back to normal activities without restrictions.  Continue current medical regimen.  With his multiple previous interventions, surgeries, and recurrent ischemic events, I am going to keep him on short interval follow-ups of 6 months rather than go annually [place]-term.

## 2021-10-19 NOTE — Progress Notes (Signed)
Remote ICD transmission.   

## 2021-10-25 ENCOUNTER — Other Ambulatory Visit: Payer: Self-pay | Admitting: Internal Medicine

## 2021-10-25 DIAGNOSIS — M545 Low back pain, unspecified: Secondary | ICD-10-CM | POA: Diagnosis not present

## 2021-10-25 DIAGNOSIS — M9903 Segmental and somatic dysfunction of lumbar region: Secondary | ICD-10-CM | POA: Diagnosis not present

## 2021-10-25 NOTE — Telephone Encounter (Signed)
Please schedule 6 month F/U appt with Dr. Saunders Revel. Thank you!

## 2021-10-26 DIAGNOSIS — L578 Other skin changes due to chronic exposure to nonionizing radiation: Secondary | ICD-10-CM | POA: Diagnosis not present

## 2021-10-26 DIAGNOSIS — L718 Other rosacea: Secondary | ICD-10-CM | POA: Diagnosis not present

## 2021-10-26 DIAGNOSIS — Z872 Personal history of diseases of the skin and subcutaneous tissue: Secondary | ICD-10-CM | POA: Diagnosis not present

## 2021-11-22 ENCOUNTER — Telehealth: Payer: Self-pay | Admitting: Gastroenterology

## 2021-11-22 NOTE — Telephone Encounter (Signed)
Pt states he would like another upper GI. He had one completed on 02-11-2019 by Dr. Allen Norris.

## 2021-11-23 ENCOUNTER — Telehealth: Payer: Self-pay

## 2021-11-23 ENCOUNTER — Other Ambulatory Visit: Payer: Self-pay | Admitting: Gastroenterology

## 2021-11-23 ENCOUNTER — Telehealth: Payer: Self-pay | Admitting: Internal Medicine

## 2021-11-23 DIAGNOSIS — R1319 Other dysphagia: Secondary | ICD-10-CM

## 2021-11-23 NOTE — Telephone Encounter (Signed)
Pt has been scheduled for 12/23/21 at [facility] clearance have been faxed

## 2021-11-23 NOTE — Telephone Encounter (Signed)
Procedure/blood thinner clearance faxed to Dr Lucky Cowboy and Cardiology

## 2021-11-23 NOTE — Telephone Encounter (Signed)
   Pre-operative Risk Assessment    Patient Name: [name]  DOB: September 04, 1950 MRN: [id number]      Request for Surgical Clearance    Procedure:   EGD  Date of Surgery:  Clearance 12/23/21                                 Surgeon:  not indicated Surgeon's Group or Practice Name:  [facility] Phone number:  [phone] Fax number:  [phone]   Type of Clearance Requested:   - Medical    Type of Anesthesia:  General    Additional requests/questions:    Manfred Arch   11/23/2021, 4:09 PM

## 2021-11-24 NOTE — Telephone Encounter (Signed)
   Name: [name]  DOB: 10-30-1950  MRN: [id number]  Primary Cardiologist: Nelva Bush, MD  Chart reviewed as part of pre-operative protocol coverage. The patient has an upcoming visit scheduled with Christell Faith, PA on 11/30/2021 at which time clearance can be addressed in case there are any issues that would impact surgical recommendations.  EGD is not scheduled until 12/23/2021 as below. I added preop FYI to appointment note so that provider is aware to address at time of outpatient visit.  Per office protocol the cardiology provider should forward their finalized clearance decision and recommendations regarding antiplatelet therapy to the requesting party below.    I will route this message as FYI to requesting party and remove this message from the preop box as separate preop APP input not needed at this time.   Please call with any questions.  Lenna Sciara, NP  11/24/2021, 11:46 AM

## 2021-11-25 ENCOUNTER — Encounter: Payer: Self-pay | Admitting: Pharmacist

## 2021-11-25 ENCOUNTER — Ambulatory Visit: Payer: PPO | Admitting: Internal Medicine

## 2021-11-25 NOTE — Progress Notes (Signed)
[facility])                                            [place] Team                                        Statin Quality Measure Assessment    11/25/2021  [name] Feb 12, 1951 [id number]   Per review of chart and payor information, patient has a diagnosis of cardiovascular disease but is not currently filling a statin prescription.  This places patient into the [facility] (Statin Use In Patients with Cardiovascular Disease) measure for CMS.    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 Treated with PCSK9i.      Component Value Date/Time   CHOL 127 03/18/2021 1110   TRIG 111 03/18/2021 1110   HDL 63 03/18/2021 1110   CHOLHDL 2.0 03/18/2021 1110   CHOLHDL 2.3 01/07/2020 1601   VLDL 32 01/07/2020 1601   LDLCALC 44 03/18/2021 1110     Please consider ONE of the following recommendations:  Initiate high intensity statin Atorvastatin '40mg'$  once daily, #90, 3 refills   Rosuvastatin '20mg'$  once daily, #90, 3 refills    Initiate moderate intensity  statin with reduced frequency if prior  statin intolerance 1x weekly, #13, 3 refills   2x weekly, #26, 3 refills   3x weekly, #39, 3 refills    Code for past statin intolerance  (required annually)  Provider Requirements: Must asociate code during an office visit or telehealth encounter   Drug Induced Myopathy G72.0   Myalgia M79.1   Myositis, unspecified M60.9   Myopathy, unspecified G72.9   Rhabdomyolysis M62.82   Loretha Brasil, PharmD [place] Pharmacist Office: [phone]

## 2021-11-30 ENCOUNTER — Ambulatory Visit: Payer: PPO | Admitting: Physician Assistant

## 2021-12-01 ENCOUNTER — Encounter: Payer: PPO | Admitting: Cardiology

## 2021-12-05 NOTE — Progress Notes (Unsigned)
Cardiology Clinic Note   Patient Name: [name] Date of Encounter: 12/06/2021  Primary Care Provider:  Glean Hess, MD Primary Cardiologist:  Nelva Bush, MD  Patient Profile    71 year old male with a history of CAD with NSTEMI 07/2015 status post PCI to the left circumflex status post CABG x4 10/2019 for development of severe distal LMCA disease, chronic HFrEF (mixed ICM/NICM) and known LBBB status post CRT-D implementation ([place] 02/2020) for ICM, paroxysmal atrial fibrillation on apixaban, carotid disease status post right carotid endarterectomy, PAD status post intervention and stent of the left SFA 05/2018, statin intolerance on Praluent, hypertension, hyperlipidemia, hiatal hernia, previous tobacco use, PUD, here for follow-up as well as surgical clearance for upcoming EGD.  Past Medical History    Past Medical History:  Diagnosis Date   Abnormal nuclear cardiac imaging test 08/08/2015   Arthritis    fingers   Carotid artery occlusion    CHF (congestive heart failure) (HCC)    Coronary atherosclerosis of native coronary artery 01/29/2013   11/05/19 R/LHC 80% dLMCA stenosis small diffusely dz dLAD, chronically occluded OM1 50%mRCA lesion, widely patent mLCx strent, moderately elevated L heart filling pressures, mild to moderate RH filling pressures, normal to moderately reduced CO   Duodenal erosion    Encounter for screening for lung cancer 07/13/2016   Esophageal stenosis    esophageal dilation   GERD (gastroesophageal reflux disease)    H. pylori infection    Heart attack ([place]) Oct. 2009   Mild   Hiatal hernia    Hyperlipidemia    Hypertension    Old myocardial infarction 11/29/2007   Mildly elevated troponin, isolated value in October 2009. Cardiac catheterization-nonobstructive 60% RCA disease-subsequent nuclear stress test-9 minutes, low risk, mild inferior wall hypokinesis    Pain in limb 12/19/2017   Peripheral vascular disease ([place])    Unstable  angina ([place]) 11/25/2017   Past Surgical History:  Procedure Laterality Date   APPENDECTOMY     BACK SURGERY     BIV ICD INSERTION CRT-D N/A 03/23/2020   Procedure: BIV ICD INSERTION CRT-D;  Surgeon: Vickie Epley, MD;  Location: [place] [facility];  Service: Cardiovascular;  Laterality: N/A;   CARDIAC CATHETERIZATION N/A 08/07/2015   Procedure: Left Heart Cath and Coronary Angiography;  Surgeon: [name], MD;  Location: [place] [facility];  Service: Cardiovascular;  Laterality: N/A;   CARDIAC CATHETERIZATION N/A 08/07/2015   Procedure: Coronary Stent Intervention;  Surgeon: [name], MD;  Location: [place] [facility];  Service: Cardiovascular;  Laterality: N/A;   CARDIAC CATHETERIZATION N/A 08/07/2015   Procedure: Coronary Stent Intervention;  Surgeon: [name], MD;  Location: [place] [facility];  Service: Cardiovascular;  Laterality: N/A;   CAROTID ENDARTERECTOMY  01/05/2006   Right  CEA with DPA   CATARACT EXTRACTION W/ INTRAOCULAR LENS IMPLANT Left 12/04/2017   CATARACT EXTRACTION W/PHACO Left 12/04/2017   Procedure: CATARACT EXTRACTION PHACO AND INTRAOCULAR LENS PLACEMENT ([place]) LEFT;  Surgeon: Eulogio Bear, MD;  Location: [place];  Service: Ophthalmology;  Laterality: Left;   CATARACT EXTRACTION W/PHACO Right 02/06/2018   Procedure: CATARACT EXTRACTION PHACO AND INTRAOCULAR LENS PLACEMENT (IOC)RIGHT;  Surgeon: Eulogio Bear, MD;  Location: [place];  Service: Ophthalmology;  Laterality: Right;   COLONOSCOPY  05/20/2008   COLONOSCOPY WITH PROPOFOL N/A 09/13/2018   Procedure: COLONOSCOPY WITH BIOPSY;  Surgeon: [name], MD;  Location: [place];  Service: Endoscopy;  Laterality: N/A;   CORONARY ARTERY BYPASS GRAFT N/A 11/11/2019   Procedure: CORONARY ARTERY BYPASS GRAFTING (CABG) USING LIMA to Diag1; ENDOSCOPICALLY HARVESTED RIGHT GREATER SAPHENOUS VEIN: SVG to OM1; SVG to OM2; SVG to PDA.;  Surgeon: Ivin Poot, MD;  Location:  [place];  Service: Open Heart Surgery;  Laterality: N/A;   CORONARY STENT PLACEMENT  08/07/2015   MID CIRCUMFLEX   ENDARTERECTOMY FEMORAL Bilateral 09/30/2020   Procedure: ENDARTERECTOMY FEMORAL;  Surgeon: Algernon Huxley, MD;  Location: [facility];  Service: Vascular;  Laterality: Bilateral;   ENDOVEIN HARVEST OF GREATER SAPHENOUS VEIN Right 11/11/2019   Procedure: ENDOVEIN HARVEST OF GREATER SAPHENOUS VEIN;  Surgeon: Ivin Poot, MD;  Location: [place];  Service: Open Heart Surgery;  Laterality: Right;   ESOPHAGOGASTRODUODENOSCOPY (EGD) WITH PROPOFOL N/A 02/11/2019   Procedure: ESOPHAGOGASTRODUODENOSCOPY (EGD) WITH BIOPSY and  Dilation;  Surgeon: [name], MD;  Location: [place];  Service: Endoscopy;  Laterality: N/A;   HIP SURGERY Left 10/2016   left hip tendon repair   LEFT HEART CATH AND CORONARY ANGIOGRAPHY N/A 11/27/2017   Procedure: LEFT HEART CATH AND CORONARY ANGIOGRAPHY;  Surgeon: Wellington Hampshire, MD;  Location: [place] [facility];  Service: Cardiovascular;  Laterality: N/A;   LOWER EXTREMITY ANGIOGRAPHY Left 02/12/2018   Procedure: LOWER EXTREMITY ANGIOGRAPHY;  Surgeon: Algernon Huxley, MD;  Location: [place] [facility];  Service: Cardiovascular;  Laterality: Left;   LOWER EXTREMITY ANGIOGRAPHY Left 03/07/2018   Procedure: LOWER EXTREMITY ANGIOGRAPHY;  Surgeon: Algernon Huxley, MD;  Location: [place] [facility];  Service: Cardiovascular;  Laterality: Left;   LOWER EXTREMITY ANGIOGRAPHY Left 06/04/2018   Procedure: LOWER EXTREMITY ANGIOGRAPHY;  Surgeon: Algernon Huxley, MD;  Location: [place] [facility];  Service: Cardiovascular;  Laterality: Left;   LOWER EXTREMITY ANGIOGRAPHY Left 09/17/2020   Procedure: LOWER EXTREMITY ANGIOGRAPHY;  Surgeon: Algernon Huxley, MD;  Location: [place] [facility];  Service: Cardiovascular;  Laterality: Left;   LOWER EXTREMITY ANGIOGRAPHY Left 09/28/2020   Procedure: LOWER EXTREMITY ANGIOGRAPHY;  Surgeon: Algernon Huxley, MD;  Location: [place] [facility]  [facility];  Service: Cardiovascular;  Laterality: Left;   LOWER EXTREMITY ANGIOGRAPHY N/A 02/15/2021   Procedure: LOWER EXTREMITY ANGIOGRAPHY;  Surgeon: Algernon Huxley, MD;  Location: [place] [facility];  Service: Cardiovascular;  Laterality: N/A;   PLACEMENT OF IMPELLA LEFT VENTRICULAR ASSIST DEVICE N/A 11/11/2019   Procedure: PLACEMENT OF IMPELLA LEFT VENTRICULAR ASSIST DEVICE 5.5;  Surgeon: Ivin Poot, MD;  Location: [place];  Service: Open Heart Surgery;  Laterality: N/A;  Midline Sternotomy   POLYPECTOMY N/A 09/13/2018   Procedure: POLYPECTOMY;  Surgeon: [name], MD;  Location: [place];  Service: Endoscopy;  Laterality: N/A;   POLYPECTOMY N/A 02/11/2019   Procedure: POLYPECTOMY;  Surgeon: [name], MD;  Location: [place];  Service: Endoscopy;  Laterality: N/A;   REMOVAL OF IMPELLA LEFT VENTRICULAR ASSIST DEVICE N/A 11/15/2019   Procedure: REMOVAL OF IMPELLA 5.5 LEFT VENTRICULAR ASSIST DEVICE;  Surgeon: Ivin Poot, MD;  Location: [place];  Service: Open Heart Surgery;  Laterality: N/A;   RIGHT/LEFT HEART CATH AND CORONARY ANGIOGRAPHY N/A 11/05/2019   Procedure: RIGHT/LEFT HEART CATH AND CORONARY ANGIOGRAPHY;  Surgeon: Nelva Bush, MD;  Location: [place] [facility];  Service: Cardiovascular;  Laterality: N/A;   SPINE SURGERY     TEE WITHOUT CARDIOVERSION N/A 11/11/2019   Procedure: TRANSESOPHAGEAL ECHOCARDIOGRAM (TEE);  Surgeon: Prescott Gum, Collier Salina, MD;  Location: [place];  Service: Open Heart Surgery;  Laterality: N/A;   TEE WITHOUT CARDIOVERSION N/A 11/15/2019   Procedure: TRANSESOPHAGEAL ECHOCARDIOGRAM (TEE);  Surgeon: Prescott Gum, Collier Salina, MD;  Location: [place];  Service: Open Heart Surgery;  Laterality: N/A;   TONSILLECTOMY      Allergies  Allergies  Allergen Reactions   Brilinta [Ticagrelor] Shortness Of Breath   Chlorhexidine Gluconate Other (See Comments)    Skin burning for hours afterward   Contrast Media [Iodinated Contrast Media] Itching    Face  and head flushing, nose itching after contrast administation for angiogram   Statins Other (See Comments)    Failed Crestor 5 mg twice weekly, Crestor 20 mg daily, Pravastatin 40 mg qd, Lipitor, Zocor - muscle aches   Zetia [Ezetimibe] Other (See Comments)    Muscle aches    History of Present Illness    Ehan R. Lawhorne is a 71 year old male with complex medical history previously mentioned  history of CAD with NSTEMI 07/2015 status post PCI to the left circumflex status post CABG x4 10/2019 for development of severe distal LMCA disease, chronic HFrEF (mixed ICM/NICM) and known LBBB status post CRT-D implementation ([place] 02/2020) for ICM, paroxysmal atrial fibrillation on apixaban, carotid disease status post right carotid endarterectomy, PAD status post intervention and stent of the left SFA 05/2018, statin intolerance on Praluent, hypertension, hyperlipidemia, hiatal hernia, previous tobacco use, and  PUD  He has a history of peripheral arterial disease and underwent mechanical thrombectomy and catheter through the lysis of the left SFA in early 05/2018.  He is status post right CEA with carotid Dopplers in 10/2019 showing 40 to 23% LICA.  He is intolerant to statins and is currently on Praluent therapy.  History of CAD status post NSTEMI in 2017 and subsequent cath with PCI to left circumflex in 2017.  He underwent repeat left heart catheterization in 10/2017 with widely patent left circumflex stent without significant restenosis and otherwise mild to moderate CAD, EF 40-45%, LVE DP 13 mm.  Echocardiogram revealed EF 45-50%.  Repeat limited echo 01/2018 EF 40-45%.  Medical management advised.  Seen in clinic 09/2019 with DOE and abnormal EKG.  Subsequent outpatient left heart catheterization 11/05/2019 showed multivessel CAD with development of severe distal LMCA disease.  Repeat echocardiogram revealed EF 25-30% LV severely dilated, mild MR.  He was transferred to [facility] for CABG x4 (LIMA to LAD, SVG  to OM1, SVG to OM 2, SVG to PDA) with placement of Impella 5.5 on 11/11/2019 (removed on postoperative day 4).  He did sustain postoperative atrial fibrillation with normal sinus rhythm restored once started on IV amiodarone and transition to oral amiodarone and apixaban at discharge.  He was diuresed and transition to p.o. Lasix 40 mg daily as well.  He did follow-up at heart failure clinic 03/01/2020 and was riding the exercise bike, lifting weights 3 times per week.  He was golfing 3-4 times a week.  He noted DOE only with heavy exertion.  He was using Lasix on a as needed basis and once euvolemic.  He remained in normal sinus rhythm with occasional PVCs and a known LBBB.  He was no longer taking amiodarone and previously had stopped digoxin due to elevated level and nausea.  Repeat echocardiogram revealed EF 30-35%, global hypokinesis with segmental lateral dyssynchrony due to LBBB, reduced RV SF, mild LVH, G1 DD.  Given his persistently low EF and chronic LBBB with probable minute septal-lateral dyssynchrony on echocardiogram he was recommended for CRT-D implementation.  Prior  to CRT-D implementation he was followed by Dr. Saunders Revel they reported almost complete resolution of chest wall soreness following his CABG and minimal DOE.  He was concerned his heart failure medications were making him feel worse than before his CABG.  He had elevated potassium on follow-up and spironolactone was taken discontinuing Entresto held then restarted the evening of 02/2020 after repeat labs showed potassium had returned to normal range.  It was noted that his serum creatinine was slightly above baseline with recommendation to drink extra water.    He underwent the implementation of a Saint Jude CRT-D on 03/23/2020 by Dr.Lambert without complications.  Subsequent wound check appointment on 2/3 without erythema or edema and Steri-Strips were removed.  Today's interrogation 2/3 without significant findings.  He returns to clinic  today stating that he has been doing well. Deneis any chest pain, shortness of breath, or palpitations. Increased his activity and playing golf regularly.  He has been tolerating his medications well.  He denies any hospitalizations or visits to the emergency department.  He does not upcoming follow-up with Dr. Quentin Ore as well for device check.  Home Medications    Current Outpatient Medications  Medication Sig Dispense Refill   amiodarone (PACERONE) 200 MG tablet Take 1 tablet (200 mg total) by mouth 2 (two) times daily for 5 days, THEN 1 tablet (200 mg total) daily. 90 tablet 3   aspirin EC 81 MG tablet Take 1 tablet (81 mg total) by mouth daily. 150 tablet 2   carvedilol (COREG) 12.5 MG tablet TAKE (1) TABLET BY MOUTH TWICE DAILY 180 tablet 1   colchicine 0.6 MG tablet TAKE 1 TABLET BY MOUTH 2 TIMES DAILY. 30 tablet 0   ELIQUIS 5 MG TABS tablet TAKE 1 TABLET BY MOUTH TWICE A DAY 180 tablet 5   nitroGLYCERIN (NITROSTAT) 0.4 MG SL tablet Place 1 tablet (0.4 mg total) under the tongue every 5 (five) minutes as needed for chest pain. 25 tablet 2   PRALUENT 150 MG/ML SOAJ Inject 150 mg into the skin every 14 (fourteen) days.     sacubitril-valsartan (ENTRESTO) 49-51 MG Take 1 tablet by mouth 2 (two) times daily. Need to make an appointment for further refills 180 tablet 3   traZODone (DESYREL) 50 MG tablet Take 1 tablet (50 mg total) by mouth at bedtime as needed. for sleep 90 tablet 1   No current facility-administered medications for this visit.     Family History    Family History  Problem Relation Age of Onset   Heart attack Mother    Coronary artery disease Mother    Heart disease Mother        Carotid Stenosis and BPG and Heart Disease before age 76   Diabetes Mother    Hypertension Mother    Heart attack Father    Heart disease Father        BPG and Heart Disease before age 54   Hypertension Father    Cancer Father 59       throat   Stroke Father    Colon cancer Neg Hx     Colon polyps Neg Hx    Esophageal cancer Neg Hx    Rectal cancer Neg Hx    Stomach cancer Neg Hx    He indicated that his mother is deceased. He indicated that his father is deceased. He indicated that the status of his neg hx is unknown.  Social History    Social History   Socioeconomic History  Marital status: Married    Spouse name: Not on file   Number of children: 1   Years of education: Not on file   Highest education level: Bachelor's degree (e.g., BA, AB, BS)  Occupational History   Not on file  Tobacco Use   Smoking status: Former    Packs/day: 1.25    Years: 35.00    Total pack years: 43.75    Types: Cigarettes    Quit date: 02/28/2005    Years since quitting: 16.7   Smokeless tobacco: Current    Types: Snuff   Tobacco comments:    occaisionally  Vaping Use   Vaping Use: Never used  Substance and Sexual Activity   Alcohol use: Yes    Alcohol/week: 8.0 - 10.0 standard drinks of alcohol    Types: 8 - 10 Glasses of wine per week    Comment: weekly   Drug use: No   Sexual activity: Yes  Other Topics Concern   Not on file  Social History Narrative   Not on file   Social Determinants of Health   Financial Resource Strain: Low Risk  (09/23/2020)   Overall Financial Resource Strain (CARDIA)    Difficulty of Paying Living Expenses: Not hard at all  Food Insecurity: No Food Insecurity (09/27/2021)   Hunger Vital Sign    Worried About Running Out of Food in the Last Year: Never true    Ran Out of Food in the Last Year: Never true  Transportation Needs: No Transportation Needs (09/27/2021)   PRAPARE - Hydrologist (Medical): No    Lack of Transportation (Non-Medical): No  Recent Concern: Transportation Needs - Unmet Transportation Needs (09/27/2021)   PRAPARE - Transportation    Lack of Transportation (Medical): Yes    Lack of Transportation (Non-Medical): Yes  Physical Activity: Insufficiently Active (09/23/2020)   Exercise Vital  Sign    Days of Exercise per Week: 2 days    Minutes of Exercise per Session: 60 min  Stress: No Stress Concern Present (09/23/2020)   [place]    Feeling of Stress : Not at all  Social Connections: Moderately Integrated (09/23/2020)   Social Connection and Isolation Panel [NHANES]    Frequency of Communication with Friends and Family: More than three times a week    Frequency of Social Gatherings with Friends and Family: Three times a week    Attends Religious Services: Never    Active Member of Clubs or Organizations: Yes    Attends Archivist Meetings: More than 4 times per year    Marital Status: Married  Human resources officer Violence: Not At Risk (09/23/2020)   Humiliation, Afraid, Rape, and Kick questionnaire    Fear of Current or Ex-Partner: No    Emotionally Abused: No    Physically Abused: No    Sexually Abused: No     Review of Systems    General:  No chills, fever, night sweats or weight changes.  Cardiovascular:  No chest pain, dyspnea on exertion, edema, orthopnea, palpitations, paroxysmal nocturnal dyspnea. Dermatological: No rash, lesions/masses Respiratory: No cough, dyspnea Urologic: No hematuria, dysuria Abdominal:   No nausea, vomiting, diarrhea, bright red blood per rectum, melena, or hematemesis Neurologic:  No visual changes, wkns, changes in mental status. All other systems reviewed and are otherwise negative except as noted above.   Physical Exam    VS:  BP 124/70 (BP Location: Left Arm, Patient Position: Sitting,  Cuff Size: Normal)   Pulse 66   Ht '5\' 6"'$  (1.676 m)   Wt 183 lb 6.4 oz (83.2 kg)   SpO2 93%   BMI 29.60 kg/m  , BMI Body mass index is 29.6 kg/m.     GEN: Well nourished, well developed, in no acute distress. HEENT: normal. Neck: Supple, no JVD, carotid bruits, or masses. Cardiac: RRR, no murmurs, rubs, or gallops. No clubbing, cyanosis, edema.  Radials/DP/PT 2+ and  equal bilaterally.  Respiratory:  Respirations regular and unlabored, clear to auscultation bilaterally. GI: Soft, nontender, nondistended, BS + x 4. MS: no deformity or atrophy. Skin: warm and dry, no rash. Neuro:  Strength and sensation are intact. Psych: Normal affect.  Accessory Clinical Findings    ECG personally reviewed by me today- A-sensed V-paced with unifocal PVC's - No acute changes  Lab Results  Component Value Date   WBC 7.4 02/16/2021   HGB 13.3 02/16/2021   HCT 40.3 02/16/2021   MCV 91.2 02/16/2021   PLT 167 02/16/2021   Lab Results  Component Value Date   CREATININE 1.12 08/18/2021   BUN 36 (H) 08/18/2021   NA 139 08/18/2021   K 5.0 08/18/2021   CL 108 08/18/2021   CO2 24 08/18/2021   Lab Results  Component Value Date   ALT 27 08/18/2021   AST 24 08/18/2021   ALKPHOS 77 08/18/2021   BILITOT 0.6 08/18/2021   Lab Results  Component Value Date   CHOL 127 03/18/2021   HDL 63 03/18/2021   LDLCALC 44 03/18/2021   TRIG 111 03/18/2021   CHOLHDL 2.0 03/18/2021    Lab Results  Component Value Date   HGBA1C 5.6 11/07/2019    Assessment & Plan   1.  Coronary artery disease status post CABG in 10/2019.  Continue to remain chest pain-free and no anginal equivalent symptoms.  Continue aggressive secondary prevention he is continued on Praluent and aspirin therapies.   2.  Postoperative atrial fibrillation without recent recurrence on device interrogation.  It was recommended he continue apixaban from the vascular in the setting of PAD.  Management of apixaban as below to vascular.  He continues to have device management follow-up with Dr. Quentin Ore.  3.  Chronic HFrEF with recovered ejection fraction without symptoms today.  He appears euvolemic on exam.  Continue his current medication regimen of carvedilol and Entresto.  He has required no standing diuretics.  4.  Hypertension with blood pressure today 120/70.  Continues to be overcontrolled.  He is continue  with his current medications unchanged today.  5.  Hyperlipidemia with LDL 44 03/18/2021.  He is continued on  Praluent, he has a statin intolerance.   6.  Peripheral arterial disease followed by vascular surgery.  He denies claudication today and is increased his activity with playing golf regularly.  He has had multiple lower extremity interventions including Viabahn stenting to the left SFA in 01/2021.     Mr. Kleinert perioperative risk of a major cardiac event is 6.6% according to the Revised Cardiac Risk Index (RCRI).  Therefore, he is at high risk for perioperative complications.   His functional capacity is good at 5.93 METs according to the Duke Activity Status Index (DASI). Recommendations: According to ACC/AHA guidelines, no further cardiovascular testing needed.  The patient may proceed to surgery at acceptable risk.   Antiplatelet and/or Anticoagulation Recommendations: The patient should remain on Aspirin without interruption.   Eliquis (Apixaban) can be held for 2 days prior to surgery.  Please resume post op when felt to be safe.      Tyleah Loh, NP 12/06/2021, 12:46 PM

## 2021-12-06 ENCOUNTER — Encounter: Payer: Self-pay | Admitting: Cardiology

## 2021-12-06 ENCOUNTER — Ambulatory Visit: Payer: PPO | Attending: Physician Assistant | Admitting: Cardiology

## 2021-12-06 ENCOUNTER — Other Ambulatory Visit
Admission: RE | Admit: 2021-12-06 | Discharge: 2021-12-06 | Disposition: A | Discharge status: Home / Self Care | Payer: PPO | Attending: Cardiology | Admitting: Cardiology

## 2021-12-06 VITALS — BP 124/70 | HR 66 | Ht 66.0 in | Wt 183.4 lb

## 2021-12-06 DIAGNOSIS — E785 Hyperlipidemia, unspecified: Secondary | ICD-10-CM

## 2021-12-06 DIAGNOSIS — I493 Ventricular premature depolarization: Secondary | ICD-10-CM | POA: Diagnosis not present

## 2021-12-06 DIAGNOSIS — I428 Other cardiomyopathies: Secondary | ICD-10-CM | POA: Diagnosis not present

## 2021-12-06 DIAGNOSIS — I2510 Atherosclerotic heart disease of native coronary artery without angina pectoris: Secondary | ICD-10-CM

## 2021-12-06 DIAGNOSIS — I10 Essential (primary) hypertension: Secondary | ICD-10-CM | POA: Diagnosis not present

## 2021-12-06 DIAGNOSIS — I739 Peripheral vascular disease, unspecified: Secondary | ICD-10-CM | POA: Diagnosis not present

## 2021-12-06 DIAGNOSIS — Z95810 Presence of automatic (implantable) cardiac defibrillator: Secondary | ICD-10-CM | POA: Insufficient documentation

## 2021-12-06 DIAGNOSIS — I5022 Chronic systolic (congestive) heart failure: Secondary | ICD-10-CM | POA: Insufficient documentation

## 2021-12-06 LAB — COMPREHENSIVE METABOLIC PANEL
ALT: 50 U/L — ABNORMAL HIGH (ref 0–44)
AST: 43 U/L — ABNORMAL HIGH (ref 15–41)
Albumin: 3.9 g/dL (ref 3.5–5.0)
Alkaline Phosphatase: 96 U/L (ref 38–126)
Anion gap: 6 (ref 5–15)
BUN: 24 mg/dL — ABNORMAL HIGH (ref 8–23)
CO2: 25 mmol/L (ref 22–32)
Calcium: 9.1 mg/dL (ref 8.9–10.3)
Chloride: 107 mmol/L (ref 98–111)
Creatinine, Ser: 1.21 mg/dL (ref 0.61–1.24)
GFR, Estimated: >60 mL/min (ref >60)
Glucose, Bld: 118 mg/dL — ABNORMAL HIGH (ref 70–99)
Potassium: 4.4 mmol/L (ref 3.5–5.1)
Sodium: 138 mmol/L (ref 135–145)
Total Bilirubin: 0.8 mg/dL (ref 0.3–1.2)
Total Protein: 7.2 g/dL (ref 6.5–8.1)

## 2021-12-06 LAB — T4, FREE: Free T4: 1.01 ng/dL (ref 0.61–1.12)

## 2021-12-06 LAB — TSH: TSH: 3.217 u[IU]/mL (ref 0.350–4.500)

## 2021-12-06 NOTE — Patient Instructions (Addendum)
Medication Instructions:  Your physician has recommended you make the following change in your medication:   HOLD Eliquis 2 days before procedure. Restart per GI recommendations.    *If you need a refill on your cardiac medications before your next appointment, please call your pharmacy*   Lab Work: None  If you have labs (blood work) drawn today and your tests are completely normal, you will receive your results only by: [place] (if you have MyChart) OR A paper copy in the mail If you have any lab test that is abnormal or we need to change your treatment, we will call you to review the results.   Testing/Procedures: None   Follow-Up: At [facility], you and your health needs are our priority.  As part of our continuing mission to provide you with exceptional heart care, we have created designated Provider Care Teams.  These Care Teams include your primary Cardiologist (physician) and Advanced Practice Providers (APPs -  Physician Assistants and Nurse Practitioners) who all work together to provide you with the care you need, when you need it.   Your next appointment:   6 month(s)  The format for your next appointment:   In Person  Provider:   Nelva Bush, MD or Gerrie Nordmann, NP       Important Information About Sugar

## 2021-12-06 NOTE — Telephone Encounter (Signed)
error 

## 2021-12-07 DIAGNOSIS — L570 Actinic keratosis: Secondary | ICD-10-CM | POA: Diagnosis not present

## 2021-12-07 DIAGNOSIS — L218 Other seborrheic dermatitis: Secondary | ICD-10-CM | POA: Diagnosis not present

## 2021-12-07 DIAGNOSIS — L718 Other rosacea: Secondary | ICD-10-CM | POA: Diagnosis not present

## 2021-12-08 ENCOUNTER — Telehealth: Payer: Self-pay | Admitting: *Deleted

## 2021-12-08 DIAGNOSIS — R748 Abnormal levels of other serum enzymes: Secondary | ICD-10-CM

## 2021-12-08 NOTE — Telephone Encounter (Signed)
-----   Message from Vickie Epley, MD sent at 12/07/2021 11:10 AM EDT ----- Worthy Keeler reviewed. Liver enzymes minimally elevated above normal range. Plan to recheck these labs in 3 months.  Otila Kluver, can you order repeat CMP in 3 months to recheck his liver function?  Lysbeth Galas T. Quentin Ore, MD, [facility], [facility] Cardiac Electrophysiology

## 2021-12-08 NOTE — Telephone Encounter (Signed)
The patient has been notified of the result and verbalized understanding.  All questions (if any) were answered. Darrell Jewel, RN 12/08/2021 5:20 PM   Discussed lab draw needed in January: [place] [facility] at [facility] 1st desk on the right to check in Lab hours: 7:30 am- 5:30 pm (walk in basis)   Patient verbalized understanding.

## 2021-12-09 NOTE — Telephone Encounter (Signed)
The patient should remain on Aspirin without interruption.    Eliquis (Apixaban) can be held for 2 days prior to surgery.  Please resume post op when felt to be safe.         SHERI HAMMOCK, NP 12/06/2021, 12:46 PM

## 2021-12-16 NOTE — Telephone Encounter (Signed)
I spoke to pt and he is aware as instructed to stop Eliquis 2 days prior to procedure

## 2021-12-20 DIAGNOSIS — M545 Low back pain, unspecified: Secondary | ICD-10-CM | POA: Diagnosis not present

## 2021-12-20 DIAGNOSIS — M9903 Segmental and somatic dysfunction of lumbar region: Secondary | ICD-10-CM | POA: Diagnosis not present

## 2021-12-21 ENCOUNTER — Encounter (INDEPENDENT_AMBULATORY_CARE_PROVIDER_SITE_OTHER): Payer: Self-pay | Admitting: Vascular Surgery

## 2021-12-21 ENCOUNTER — Ambulatory Visit (INDEPENDENT_AMBULATORY_CARE_PROVIDER_SITE_OTHER): Payer: PPO

## 2021-12-21 DIAGNOSIS — M76892 Other specified enthesopathies of left lower limb, excluding foot: Secondary | ICD-10-CM | POA: Insufficient documentation

## 2021-12-21 DIAGNOSIS — M7062 Trochanteric bursitis, left hip: Secondary | ICD-10-CM | POA: Diagnosis not present

## 2021-12-21 DIAGNOSIS — I428 Other cardiomyopathies: Secondary | ICD-10-CM | POA: Diagnosis not present

## 2021-12-22 ENCOUNTER — Encounter: Payer: Self-pay | Admitting: Cardiology

## 2021-12-22 ENCOUNTER — Ambulatory Visit: Payer: PPO | Attending: Cardiology | Admitting: Cardiology

## 2021-12-22 ENCOUNTER — Ambulatory Visit (INDEPENDENT_AMBULATORY_CARE_PROVIDER_SITE_OTHER): Payer: PPO | Admitting: Nurse Practitioner

## 2021-12-22 ENCOUNTER — Ambulatory Visit (INDEPENDENT_AMBULATORY_CARE_PROVIDER_SITE_OTHER): Payer: PPO

## 2021-12-22 ENCOUNTER — Encounter (INDEPENDENT_AMBULATORY_CARE_PROVIDER_SITE_OTHER): Payer: Self-pay | Admitting: Nurse Practitioner

## 2021-12-22 ENCOUNTER — Other Ambulatory Visit (INDEPENDENT_AMBULATORY_CARE_PROVIDER_SITE_OTHER): Payer: Self-pay | Admitting: Nurse Practitioner

## 2021-12-22 VITALS — BP 110/64 | HR 64 | Ht 66.0 in | Wt 182.0 lb

## 2021-12-22 VITALS — BP 133/90 | HR 83 | Resp 17 | Ht 66.0 in | Wt 181.6 lb

## 2021-12-22 DIAGNOSIS — I739 Peripheral vascular disease, unspecified: Secondary | ICD-10-CM

## 2021-12-22 DIAGNOSIS — I5022 Chronic systolic (congestive) heart failure: Secondary | ICD-10-CM | POA: Diagnosis not present

## 2021-12-22 DIAGNOSIS — I428 Other cardiomyopathies: Secondary | ICD-10-CM | POA: Diagnosis not present

## 2021-12-22 DIAGNOSIS — M79662 Pain in left lower leg: Secondary | ICD-10-CM

## 2021-12-22 DIAGNOSIS — Z95810 Presence of automatic (implantable) cardiac defibrillator: Secondary | ICD-10-CM

## 2021-12-22 DIAGNOSIS — I7090 Unspecified atherosclerosis: Secondary | ICD-10-CM

## 2021-12-22 DIAGNOSIS — I10 Essential (primary) hypertension: Secondary | ICD-10-CM | POA: Diagnosis not present

## 2021-12-22 DIAGNOSIS — Z9889 Other specified postprocedural states: Secondary | ICD-10-CM

## 2021-12-22 DIAGNOSIS — E785 Hyperlipidemia, unspecified: Secondary | ICD-10-CM

## 2021-12-22 DIAGNOSIS — Z79899 Other long term (current) drug therapy: Secondary | ICD-10-CM | POA: Diagnosis not present

## 2021-12-22 DIAGNOSIS — I493 Ventricular premature depolarization: Secondary | ICD-10-CM

## 2021-12-22 MED ORDER — MEXILETINE HCL 150 MG PO CAPS: 150.0000 mg | ORAL_CAPSULE | Freq: Two times a day (BID) | ORAL | 3 refills | Status: DC

## 2021-12-22 NOTE — Progress Notes (Signed)
Electrophysiology Office Follow up Visit Note:    Date:  12/22/2021   ID:  [name], DOB 1951-01-11, MRN [id number]  PCP:  Glean Hess, MD  [facility] Cardiologist:  Nelva Bush, MD  [facility] Electrophysiologist:  Vickie Epley, MD    Interval History:    [name] is a 71 y.o. male who presents for a follow up visit. They were last seen in clinic August 18, 2021.  He has a CRT-D was implanted in January 2022.  He has historically had a low BiV pacing percentage due to PVCs.  Amiodarone was started at the last appointment.  He has recently stopped taking his amiodarone due to off target effects.  He tells me that he generally felt poor while taking amiodarone.     Past Medical History:  Diagnosis Date   Abnormal nuclear cardiac imaging test 08/08/2015   Arthritis    fingers   Carotid artery occlusion    CHF (congestive heart failure) (HCC)    Coronary atherosclerosis of native coronary artery 01/29/2013   11/05/19 R/LHC 80% dLMCA stenosis small diffusely dz dLAD, chronically occluded OM1 50%mRCA lesion, widely patent mLCx strent, moderately elevated L heart filling pressures, mild to moderate RH filling pressures, normal to moderately reduced CO   Duodenal erosion    Encounter for screening for lung cancer 07/13/2016   Esophageal stenosis    esophageal dilation   GERD (gastroesophageal reflux disease)    H. pylori infection    Heart attack ([place]) Oct. 2009   Mild   Hiatal hernia    Hyperlipidemia    Hypertension    Old myocardial infarction 11/29/2007   Mildly elevated troponin, isolated value in October 2009. Cardiac catheterization-nonobstructive 60% RCA disease-subsequent nuclear stress test-9 minutes, low risk, mild inferior wall hypokinesis    Pain in limb 12/19/2017   Peripheral vascular disease ([place])    Unstable angina ([place]) 11/25/2017    Past Surgical History:  Procedure Laterality Date   APPENDECTOMY     BACK SURGERY     BIV ICD  INSERTION CRT-D N/A 03/23/2020   Procedure: BIV ICD INSERTION CRT-D;  Surgeon: Vickie Epley, MD;  Location: [place] [facility];  Service: Cardiovascular;  Laterality: N/A;   CARDIAC CATHETERIZATION N/A 08/07/2015   Procedure: Left Heart Cath and Coronary Angiography;  Surgeon: [name], MD;  Location: [place] [facility];  Service: Cardiovascular;  Laterality: N/A;   CARDIAC CATHETERIZATION N/A 08/07/2015   Procedure: Coronary Stent Intervention;  Surgeon: [name], MD;  Location: [place] [facility];  Service: Cardiovascular;  Laterality: N/A;   CARDIAC CATHETERIZATION N/A 08/07/2015   Procedure: Coronary Stent Intervention;  Surgeon: [name], MD;  Location: [place] [facility];  Service: Cardiovascular;  Laterality: N/A;   CAROTID ENDARTERECTOMY  01/05/2006   Right  CEA with DPA   CATARACT EXTRACTION W/ INTRAOCULAR LENS IMPLANT Left 12/04/2017   CATARACT EXTRACTION W/PHACO Left 12/04/2017   Procedure: CATARACT EXTRACTION PHACO AND INTRAOCULAR LENS PLACEMENT ([place]) LEFT;  Surgeon: Eulogio Bear, MD;  Location: [place];  Service: Ophthalmology;  Laterality: Left;   CATARACT EXTRACTION W/PHACO Right 02/06/2018   Procedure: CATARACT EXTRACTION PHACO AND INTRAOCULAR LENS PLACEMENT (IOC)RIGHT;  Surgeon: Eulogio Bear, MD;  Location: [place];  Service: Ophthalmology;  Laterality: Right;   COLONOSCOPY  05/20/2008   COLONOSCOPY WITH PROPOFOL N/A 09/13/2018   Procedure: COLONOSCOPY WITH BIOPSY;  Surgeon: [name], MD;  Location: [place];  Service: Endoscopy;  Laterality: N/A;   CORONARY ARTERY BYPASS GRAFT N/A 11/11/2019   Procedure: CORONARY ARTERY BYPASS GRAFTING (CABG) USING LIMA to Diag1; ENDOSCOPICALLY HARVESTED RIGHT GREATER SAPHENOUS VEIN: SVG to OM1; SVG to OM2; SVG to PDA.;  Surgeon: Ivin Poot, MD;  Location: [place];  Service: Open Heart Surgery;  Laterality: N/A;   CORONARY STENT PLACEMENT  08/07/2015   MID CIRCUMFLEX    ENDARTERECTOMY FEMORAL Bilateral 09/30/2020   Procedure: ENDARTERECTOMY FEMORAL;  Surgeon: Algernon Huxley, MD;  Location: [facility];  Service: Vascular;  Laterality: Bilateral;   ENDOVEIN HARVEST OF GREATER SAPHENOUS VEIN Right 11/11/2019   Procedure: ENDOVEIN HARVEST OF GREATER SAPHENOUS VEIN;  Surgeon: Ivin Poot, MD;  Location: [place];  Service: Open Heart Surgery;  Laterality: Right;   ESOPHAGOGASTRODUODENOSCOPY (EGD) WITH PROPOFOL N/A 02/11/2019   Procedure: ESOPHAGOGASTRODUODENOSCOPY (EGD) WITH BIOPSY and  Dilation;  Surgeon: [name], MD;  Location: [place];  Service: Endoscopy;  Laterality: N/A;   HIP SURGERY Left 10/2016   left hip tendon repair   LEFT HEART CATH AND CORONARY ANGIOGRAPHY N/A 11/27/2017   Procedure: LEFT HEART CATH AND CORONARY ANGIOGRAPHY;  Surgeon: Wellington Hampshire, MD;  Location: [place] [facility];  Service: Cardiovascular;  Laterality: N/A;   LOWER EXTREMITY ANGIOGRAPHY Left 02/12/2018   Procedure: LOWER EXTREMITY ANGIOGRAPHY;  Surgeon: Algernon Huxley, MD;  Location: [place] [facility];  Service: Cardiovascular;  Laterality: Left;   LOWER EXTREMITY ANGIOGRAPHY Left 03/07/2018   Procedure: LOWER EXTREMITY ANGIOGRAPHY;  Surgeon: Algernon Huxley, MD;  Location: [place] [facility];  Service: Cardiovascular;  Laterality: Left;   LOWER EXTREMITY ANGIOGRAPHY Left 06/04/2018   Procedure: LOWER EXTREMITY ANGIOGRAPHY;  Surgeon: Algernon Huxley, MD;  Location: [place] [facility];  Service: Cardiovascular;  Laterality: Left;   LOWER EXTREMITY ANGIOGRAPHY Left 09/17/2020   Procedure: LOWER EXTREMITY ANGIOGRAPHY;  Surgeon: Algernon Huxley, MD;  Location: [place] [facility];  Service: Cardiovascular;  Laterality: Left;   LOWER EXTREMITY ANGIOGRAPHY Left 09/28/2020   Procedure: LOWER EXTREMITY ANGIOGRAPHY;  Surgeon: Algernon Huxley, MD;  Location: [place] [facility];  Service: Cardiovascular;  Laterality: Left;   LOWER EXTREMITY ANGIOGRAPHY N/A 02/15/2021   Procedure: LOWER  EXTREMITY ANGIOGRAPHY;  Surgeon: Algernon Huxley, MD;  Location: [place] [facility];  Service: Cardiovascular;  Laterality: N/A;   PLACEMENT OF IMPELLA LEFT VENTRICULAR ASSIST DEVICE N/A 11/11/2019   Procedure: PLACEMENT OF IMPELLA LEFT VENTRICULAR ASSIST DEVICE 5.5;  Surgeon: Ivin Poot, MD;  Location: [place];  Service: Open Heart Surgery;  Laterality: N/A;  Midline Sternotomy   POLYPECTOMY N/A 09/13/2018   Procedure: POLYPECTOMY;  Surgeon: [name], MD;  Location: [place];  Service: Endoscopy;  Laterality: N/A;   POLYPECTOMY N/A 02/11/2019   Procedure: POLYPECTOMY;  Surgeon: [name], MD;  Location: [place];  Service: Endoscopy;  Laterality: N/A;   REMOVAL OF IMPELLA LEFT VENTRICULAR ASSIST DEVICE N/A 11/15/2019   Procedure: REMOVAL OF IMPELLA 5.5 LEFT VENTRICULAR ASSIST DEVICE;  Surgeon: Ivin Poot, MD;  Location: [place];  Service: Open Heart Surgery;  Laterality: N/A;   RIGHT/LEFT HEART CATH AND CORONARY ANGIOGRAPHY N/A 11/05/2019   Procedure: RIGHT/LEFT HEART CATH AND CORONARY ANGIOGRAPHY;  Surgeon: Nelva Bush, MD;  Location: [place] [facility];  Service: Cardiovascular;  Laterality: N/A;   SPINE SURGERY     TEE WITHOUT CARDIOVERSION N/A 11/11/2019   Procedure: TRANSESOPHAGEAL ECHOCARDIOGRAM (TEE);  Surgeon: Ivin Poot, MD;  Location:  [place] [facility];  Service: Open Heart Surgery;  Laterality: N/A;   TEE WITHOUT CARDIOVERSION N/A 11/15/2019   Procedure: TRANSESOPHAGEAL ECHOCARDIOGRAM (TEE);  Surgeon: Prescott Gum, Collier Salina, MD;  Location: [place];  Service: Open Heart Surgery;  Laterality: N/A;   TONSILLECTOMY      Current Medications: Current Meds  Medication Sig   aspirin EC 81 MG tablet Take 1 tablet (81 mg total) by mouth daily.   carvedilol (COREG) 12.5 MG tablet TAKE (1) TABLET BY MOUTH TWICE DAILY   colchicine 0.6 MG tablet TAKE 1 TABLET BY MOUTH 2 TIMES DAILY.   ELIQUIS 5 MG TABS tablet TAKE 1 TABLET BY MOUTH TWICE A DAY   mexiletine (MEXITIL) 150  MG capsule Take 1 capsule (150 mg total) by mouth 2 (two) times daily.   nitroGLYCERIN (NITROSTAT) 0.4 MG SL tablet Place 1 tablet (0.4 mg total) under the tongue every 5 (five) minutes as needed for chest pain.   PRALUENT 150 MG/ML SOAJ Inject 150 mg into the skin every 14 (fourteen) days.   sacubitril-valsartan (ENTRESTO) 49-51 MG Take 1 tablet by mouth 2 (two) times daily. Need to make an appointment for further refills   traZODone (DESYREL) 50 MG tablet Take 1 tablet (50 mg total) by mouth at bedtime as needed. for sleep   [DISCONTINUED] amiodarone (PACERONE) 200 MG tablet Take 1 tablet (200 mg total) by mouth 2 (two) times daily for 5 days, THEN 1 tablet (200 mg total) daily.     Allergies:   Brilinta [ticagrelor], Chlorhexidine gluconate, Contrast media [iodinated contrast media], Statins, and Zetia [ezetimibe]   Social History   Socioeconomic History   Marital status: Married    Spouse name: Not on file   Number of children: 1   Years of education: Not on file   Highest education level: Bachelor's degree (e.g., BA, AB, BS)  Occupational History   Not on file  Tobacco Use   Smoking status: Former    Packs/day: 1.25    Years: 35.00    Total pack years: 43.75    Types: Cigarettes    Quit date: 02/28/2005    Years since quitting: 16.8   Smokeless tobacco: Current    Types: Snuff   Tobacco comments:    occaisionally  Vaping Use   Vaping Use: Never used  Substance and Sexual Activity   Alcohol use: Yes    Alcohol/week: 8.0 - 10.0 standard drinks of alcohol    Types: 8 - 10 Glasses of wine per week    Comment: weekly   Drug use: No   Sexual activity: Yes  Other Topics Concern   Not on file  Social History Narrative   Not on file   Social Determinants of Health   Financial Resource Strain: Low Risk  (09/23/2020)   Overall Financial Resource Strain (CARDIA)    Difficulty of Paying Living Expenses: Not hard at all  Food Insecurity: No Food Insecurity (09/27/2021)   Hunger  Vital Sign    Worried About Running Out of Food in the Last Year: Never true    Ran Out of Food in the Last Year: Never true  Transportation Needs: No Transportation Needs (09/27/2021)   PRAPARE - Hydrologist (Medical): No    Lack of Transportation (Non-Medical): No  Recent Concern: Transportation Needs - Unmet Transportation Needs (09/27/2021)   PRAPARE - Hydrologist (Medical): Yes    Lack of Transportation (Non-Medical): Yes  Physical Activity: Insufficiently Active (  09/23/2020)   Exercise Vital Sign    Days of Exercise per Week: 2 days    Minutes of Exercise per Session: 60 min  Stress: No Stress Concern Present (09/23/2020)   [place]    Feeling of Stress : Not at all  Social Connections: Moderately Integrated (09/23/2020)   Social Connection and Isolation Panel [NHANES]    Frequency of Communication with Friends and Family: More than three times a week    Frequency of Social Gatherings with Friends and Family: Three times a week    Attends Religious Services: Never    Active Member of Clubs or Organizations: Yes    Attends Music therapist: More than 4 times per year    Marital Status: Married     Family History: The patient's family history includes Cancer (age of onset: 76) in his father; Coronary artery disease in his mother; Diabetes in his mother; Heart attack in his father and mother; Heart disease in his father and mother; Hypertension in his father and mother; Stroke in his father. There is no history of Colon cancer, Colon polyps, Esophageal cancer, Rectal cancer, or Stomach cancer.  ROS:   Please see the history of present illness.    All other systems reviewed and are negative.  EKGs/Labs/Other Studies Reviewed:    The following studies were reviewed today:  December 22, 2021 in clinic device interrogation personally  reviewed Battery longevity 4 years Lead parameters stable BiV pacing 83% Atrial pacing 64% PVC burden 15% Reprogrammed base rate to 70 to attempt overdrive suppress his PVC    Recent Labs: 02/15/2021: B Natriuretic Peptide 415.4 02/16/2021: Hemoglobin 13.3; Platelets 167 12/06/2021: ALT 50; BUN 24; Creatinine, Ser 1.21; Potassium 4.4; Sodium 138; TSH 3.217  Recent Lipid Panel    Component Value Date/Time   CHOL 127 03/18/2021 1110   TRIG 111 03/18/2021 1110   HDL 63 03/18/2021 1110   CHOLHDL 2.0 03/18/2021 1110   CHOLHDL 2.3 01/07/2020 1601   VLDL 32 01/07/2020 1601   LDLCALC 44 03/18/2021 1110    Physical Exam:    VS:  BP 110/64   Pulse 64   Ht 5' 6" (1.676 m)   Wt 182 lb (82.6 kg)   SpO2 92%   BMI 29.38 kg/m     Wt Readings from Last 3 Encounters:  12/22/21 181 lb 9.6 oz (82.4 kg)  12/22/21 182 lb (82.6 kg)  12/06/21 183 lb 6.4 oz (83.2 kg)     GEN:  Well nourished, well developed in no acute distress HEENT: Normal NECK: No JVD; No carotid bruits LYMPHATICS: No lymphadenopathy CARDIAC: RRR, no murmurs, rubs, gallops.  ICD pocket well-healed RESPIRATORY:  Clear to auscultation without rales, wheezing or rhonchi  ABDOMEN: Soft, non-tender, non-distended MUSCULOSKELETAL:  No edema; No deformity  SKIN: Warm and dry NEUROLOGIC:  Alert and oriented x 3 PSYCHIATRIC:  Normal affect        ASSESSMENT:    1. Chronic HFrEF (heart failure with reduced ejection fraction) ([place])   2. Nonischemic cardiomyopathy ([place])   3. Presence of cardiac resynchronization therapy defibrillator (CRT-D)   4. Frequent PVCs   5. Medication management    PLAN:    In order of problems listed above:  #Chronic systolic heart failure #Nonischemic cardiomyopathy NYHA class II.  Warm and dry on exam. Continue Coreg, Entresto.  #CRT-D in situ #PVCs He has recently stopped amiodarone.  Discussed alternatives for PVC suppression including mexiletine and sotalol.  We will plan to  start mexiletine 150 mg by mouth twice daily today.  If we have an adequate PVC suppression after several months of therapy, plan to start sotalol while in the hospital.  I discussed this process in detail with the patient who is understanding. Device functioning appropriately.  Continue remote monitoring.   #Amiodarone monitoring Last AST/ALT was 43/50.  Last TSH was 3.2.  These were all done in October 2023.   Follow-up 6 months with APP.  CMP, TSH and free T4 at that appointment.   Medication Adjustments/Labs and Tests Ordered: Current medicines are reviewed at length with the patient today.  Concerns regarding medicines are outlined above.  Orders Placed This Encounter  Procedures   Comp Met (CMET)   TSH   T4, free   Meds ordered this encounter  Medications   mexiletine (MEXITIL) 150 MG capsule    Sig: Take 1 capsule (150 mg total) by mouth 2 (two) times daily.    Dispense:  180 capsule    Refill:  3     Signed, Lars Mage, MD, [facility], [facility] 12/22/2021 8:15 PM    Electrophysiology [facility]

## 2021-12-22 NOTE — Patient Instructions (Addendum)
Medication Instructions:  Stop Amiodarone  Start Mexiletine 150 mg two times a day *If you need a refill on your cardiac medications before your next appointment, please call your pharmacy*   Lab Work: CMP, TSH, Free T4 prior to next appt at [place] at [facility] 1st desk on the right to check in Lab hours: 7:30 am- 5:30 pm (walk in basis) no fasting needed.   If you have labs (blood work) drawn today and your tests are completely normal, you will receive your results only by: [place] (if you have MyChart) OR A paper copy in the mail If you have any lab test that is abnormal or we need to change your treatment, we will call you to review the results.   Testing/Procedures: none   Follow-Up: At [facility], you and your health needs are our priority.  As part of our continuing mission to provide you with exceptional heart care, we have created designated Provider Care Teams.  These Care Teams include your primary Cardiologist (physician) and Advanced Practice Providers (APPs -  Physician Assistants and Nurse Practitioners) who all work together to provide you with the care you need, when you need it.  We recommend signing up for the patient portal called "MyChart".  Sign up information is provided on this After Visit Summary.  MyChart is used to connect with patients for Virtual Visits (Telemedicine).  Patients are able to view lab/test results, encounter notes, upcoming appointments, etc.  Non-urgent messages can be sent to your provider as well.   To learn more about what you can do with MyChart, go to [email or web address].    Your next appointment:   3 months with Dr. Quentin Ore and 6 month(s)  The format for your next appointment:   In Person  Provider:   You will see one of the following Advanced Practice Providers on your designated Care Team:   Murray Hodgkins, NP Christell Faith, PA-C Cadence Kathlen Mody, PA-C Gerrie Nordmann, NP      Other  Instructions None   Important Information About Sugar

## 2021-12-23 ENCOUNTER — Encounter
Admission: RE | Disposition: A | Discharge status: Home / Self Care | Payer: Self-pay | Source: Ambulatory Visit | Attending: Gastroenterology

## 2021-12-23 ENCOUNTER — Ambulatory Visit: Payer: PPO | Admitting: Anesthesiology

## 2021-12-23 ENCOUNTER — Ambulatory Visit
Admission: RE | Admit: 2021-12-23 | Discharge: 2021-12-23 | Disposition: A | Discharge status: Home / Self Care | Payer: PPO | Source: Ambulatory Visit | Attending: Gastroenterology | Admitting: Gastroenterology

## 2021-12-23 ENCOUNTER — Telehealth (INDEPENDENT_AMBULATORY_CARE_PROVIDER_SITE_OTHER): Payer: Self-pay

## 2021-12-23 DIAGNOSIS — K222 Esophageal obstruction: Secondary | ICD-10-CM | POA: Diagnosis not present

## 2021-12-23 DIAGNOSIS — Z7901 Long term (current) use of anticoagulants: Secondary | ICD-10-CM | POA: Insufficient documentation

## 2021-12-23 DIAGNOSIS — Z87891 Personal history of nicotine dependence: Secondary | ICD-10-CM | POA: Insufficient documentation

## 2021-12-23 DIAGNOSIS — E782 Mixed hyperlipidemia: Secondary | ICD-10-CM | POA: Diagnosis not present

## 2021-12-23 DIAGNOSIS — I509 Heart failure, unspecified: Secondary | ICD-10-CM | POA: Diagnosis not present

## 2021-12-23 DIAGNOSIS — I110 Hypertensive heart disease with heart failure: Secondary | ICD-10-CM | POA: Diagnosis not present

## 2021-12-23 DIAGNOSIS — I252 Old myocardial infarction: Secondary | ICD-10-CM | POA: Diagnosis not present

## 2021-12-23 DIAGNOSIS — R1319 Other dysphagia: Secondary | ICD-10-CM

## 2021-12-23 DIAGNOSIS — E785 Hyperlipidemia, unspecified: Secondary | ICD-10-CM | POA: Insufficient documentation

## 2021-12-23 DIAGNOSIS — Z79899 Other long term (current) drug therapy: Secondary | ICD-10-CM | POA: Insufficient documentation

## 2021-12-23 DIAGNOSIS — I739 Peripheral vascular disease, unspecified: Secondary | ICD-10-CM | POA: Diagnosis not present

## 2021-12-23 DIAGNOSIS — Q393 Congenital stenosis and stricture of esophagus: Secondary | ICD-10-CM | POA: Diagnosis not present

## 2021-12-23 DIAGNOSIS — K219 Gastro-esophageal reflux disease without esophagitis: Secondary | ICD-10-CM | POA: Insufficient documentation

## 2021-12-23 DIAGNOSIS — I2510 Atherosclerotic heart disease of native coronary artery without angina pectoris: Secondary | ICD-10-CM | POA: Insufficient documentation

## 2021-12-23 DIAGNOSIS — Z955 Presence of coronary angioplasty implant and graft: Secondary | ICD-10-CM | POA: Insufficient documentation

## 2021-12-23 DIAGNOSIS — F1090 Alcohol use, unspecified, uncomplicated: Secondary | ICD-10-CM | POA: Diagnosis not present

## 2021-12-23 DIAGNOSIS — Z951 Presence of aortocoronary bypass graft: Secondary | ICD-10-CM | POA: Diagnosis not present

## 2021-12-23 DIAGNOSIS — T7840XA Allergy, unspecified, initial encounter: Secondary | ICD-10-CM | POA: Diagnosis not present

## 2021-12-23 DIAGNOSIS — I10 Essential (primary) hypertension: Secondary | ICD-10-CM | POA: Diagnosis not present

## 2021-12-23 DIAGNOSIS — K449 Diaphragmatic hernia without obstruction or gangrene: Secondary | ICD-10-CM | POA: Diagnosis not present

## 2021-12-23 DIAGNOSIS — R1310 Dysphagia, unspecified: Secondary | ICD-10-CM | POA: Diagnosis not present

## 2021-12-23 HISTORY — PX: ESOPHAGOGASTRODUODENOSCOPY (EGD) WITH PROPOFOL: SHX5813

## 2021-12-23 LAB — CUP PACEART REMOTE DEVICE CHECK
Battery Remaining Longevity: 50 mo
Battery Remaining Percentage: 71 %
Battery Voltage: 2.96 V
Brady Statistic AP VP Percent: 67 %
Brady Statistic AP VS Percent: 1.9 %
Brady Statistic AS VP Percent: 16 %
Brady Statistic AS VS Percent: 1 %
Brady Statistic RA Percent Paced: 64 %
Date Time Interrogation Session: 20231024062900
HighPow Impedance: 56 Ohm
Implantable Lead Connection Status: 753985
Implantable Lead Connection Status: 753985
Implantable Lead Connection Status: 753985
Implantable Lead Implant Date: 20220124
Implantable Lead Implant Date: 20220124
Implantable Lead Implant Date: 20220124
Implantable Lead Location: 753858
Implantable Lead Location: 753859
Implantable Lead Location: 753860
Implantable Pulse Generator Implant Date: 20220124
Lead Channel Impedance Value: 450 Ohm
Lead Channel Impedance Value: 460 Ohm
Lead Channel Impedance Value: 510 Ohm
Lead Channel Pacing Threshold Amplitude: 0.75 V
Lead Channel Pacing Threshold Amplitude: 0.75 V
Lead Channel Pacing Threshold Amplitude: 1.875 V
Lead Channel Pacing Threshold Pulse Width: 0.5 ms
Lead Channel Pacing Threshold Pulse Width: 0.5 ms
Lead Channel Pacing Threshold Pulse Width: 0.5 ms
Lead Channel Sensing Intrinsic Amplitude: 12 mV
Lead Channel Sensing Intrinsic Amplitude: 2.1 mV
Lead Channel Setting Pacing Amplitude: 1.75 V
Lead Channel Setting Pacing Amplitude: 2 V
Lead Channel Setting Pacing Amplitude: 2.375
Lead Channel Setting Pacing Pulse Width: 0.5 ms
Lead Channel Setting Pacing Pulse Width: 0.5 ms
Lead Channel Setting Sensing Sensitivity: 0.5 mV
Pulse Gen Serial Number: 810017138
Zone Setting Status: 755011

## 2021-12-23 SURGERY — ESOPHAGOGASTRODUODENOSCOPY (EGD) WITH PROPOFOL
Anesthesia: General

## 2021-12-23 MED ORDER — SODIUM CHLORIDE 0.9 % IV SOLN
INTRAVENOUS | Status: DC
  Administered 2021-12-23: 20 mL/h via INTRAVENOUS

## 2021-12-23 MED ORDER — LIDOCAINE HCL (CARDIAC) PF 100 MG/5ML IV SOSY
PREFILLED_SYRINGE | INTRAVENOUS | Status: DC | PRN
  Administered 2021-12-23: 100 mg via INTRAVENOUS

## 2021-12-23 MED ORDER — PROPOFOL 10 MG/ML IV BOLUS
INTRAVENOUS | Status: DC | PRN
  Administered 2021-12-23 (×2): 20 mg via INTRAVENOUS
  Administered 2021-12-23: 100 mg via INTRAVENOUS
  Administered 2021-12-23: 20 mg via INTRAVENOUS

## 2021-12-23 NOTE — Anesthesia Preprocedure Evaluation (Signed)
Anesthesia Evaluation  Patient identified by MRN, date of birth, ID band Patient awake    Reviewed: Allergy & Precautions, H&P , NPO status , Patient's Chart, lab work & pertinent test results, reviewed documented beta blocker date and time   Airway Mallampati: II   Neck ROM: full    Dental  (+) Poor Dentition   Pulmonary shortness of breath, former smoker,    Pulmonary exam normal        Cardiovascular Exercise Tolerance: Poor hypertension, On Medications + angina with exertion + CAD, + Past MI, + Peripheral Vascular Disease and +CHF  Normal cardiovascular exam+ dysrhythmias  Rhythm:regular Rate:Abnormal     Neuro/Psych  Neuromuscular disease negative psych ROS   GI/Hepatic Neg liver ROS, hiatal hernia, PUD, GERD  ,  Endo/Other  negative endocrine ROS  Renal/GU Renal disease  negative genitourinary   Musculoskeletal   Abdominal   Peds  Hematology negative hematology ROS (+)   Anesthesia Other Findings Past Medical History: 08/08/2015: Abnormal nuclear cardiac imaging test No date: Arthritis     Comment:  fingers No date: Carotid artery occlusion No date: CHF (congestive heart failure) (HCC) 01/29/2013: Coronary atherosclerosis of native coronary artery     Comment:  11/05/19 R/LHC 80% dLMCA stenosis small diffusely dz dLAD,              chronically occluded OM1 50%mRCA lesion, widely patent               mLCx strent, moderately elevated L heart filling               pressures, mild to moderate RH filling pressures, normal               to moderately reduced CO No date: Duodenal erosion 07/13/2016: Encounter for screening for lung cancer No date: Esophageal stenosis     Comment:  esophageal dilation No date: GERD (gastroesophageal reflux disease) No date: H. pylori infection Oct. 2009: Heart attack ([place])     Comment:  Mild No date: Hiatal hernia No date: Hyperlipidemia No date: Hypertension 11/29/2007: Old  myocardial infarction     Comment:  Mildly elevated troponin, isolated value in October               2009. Cardiac catheterization-nonobstructive 60% RCA               disease-subsequent nuclear stress test-9 minutes, low               risk, mild inferior wall hypokinesis  12/19/2017: Pain in limb No date: Peripheral vascular disease ([place]) 11/25/2017: Unstable angina ([place]) Past Surgical History: No date: APPENDECTOMY No date: BACK SURGERY 03/23/2020: BIV ICD INSERTION CRT-D; N/A     Comment:  Procedure: BIV ICD INSERTION CRT-D;  Surgeon: Vickie Epley, MD;  Location: [facility];  Service:               Cardiovascular;  Laterality: N/A; 08/07/2015: CARDIAC CATHETERIZATION; N/A     Comment:  Procedure: Left Heart Cath and Coronary Angiography;                Surgeon: [name], MD;  Location: [facility];  Service: Cardiovascular;  Laterality: N/A; 08/07/2015: CARDIAC CATHETERIZATION; N/A     Comment:  Procedure: Coronary Stent Intervention;  Surgeon: Elta Guadeloupe  Lurline Del, MD;  Location: [facility];  Service:               Cardiovascular;  Laterality: N/A; 08/07/2015: CARDIAC CATHETERIZATION; N/A     Comment:  Procedure: Coronary Stent Intervention;  Surgeon: Peter               M Martinique, MD;  Location: [facility];  Service:               Cardiovascular;  Laterality: N/A; 01/05/2006: CAROTID ENDARTERECTOMY     Comment:  Right  CEA with DPA 12/04/2017: CATARACT EXTRACTION W/ INTRAOCULAR LENS IMPLANT; Left 12/04/2017: CATARACT EXTRACTION W/PHACO; Left     Comment:  Procedure: CATARACT EXTRACTION PHACO AND INTRAOCULAR               LENS PLACEMENT ([place]) LEFT;  Surgeon: Eulogio Bear,               MD;  Location: [place];  Service:               Ophthalmology;  Laterality: Left; 02/06/2018: CATARACT EXTRACTION W/PHACO; Right     Comment:  Procedure: CATARACT EXTRACTION PHACO AND INTRAOCULAR                LENS PLACEMENT (IOC)RIGHT;  Surgeon: Eulogio Bear,               MD;  Location: [facility];  Service:               Ophthalmology;  Laterality: Right; 05/20/2008: COLONOSCOPY 09/13/2018: COLONOSCOPY WITH PROPOFOL; N/A     Comment:  Procedure: COLONOSCOPY WITH BIOPSY;  Surgeon: [name], MD;  Location: [facility];  Service:               Endoscopy;  Laterality: N/A; 11/11/2019: CORONARY ARTERY BYPASS GRAFT; N/A     Comment:  Procedure: CORONARY ARTERY BYPASS GRAFTING (CABG) USING               LIMA to Diag1; ENDOSCOPICALLY HARVESTED RIGHT GREATER               SAPHENOUS VEIN: SVG to OM1; SVG to OM2; SVG to PDA.;                Surgeon: Ivin Poot, MD;  Location: [facility];                Service: Open Heart Surgery;  Laterality: N/A; 08/07/2015: CORONARY STENT PLACEMENT     Comment:  MID CIRCUMFLEX 09/30/2020: ENDARTERECTOMY FEMORAL; Bilateral     Comment:  Procedure: ENDARTERECTOMY FEMORAL;  Surgeon: Algernon Huxley, MD;  Location: [facility];  Service: Vascular;                Laterality: Bilateral; 11/11/2019: ENDOVEIN HARVEST OF GREATER SAPHENOUS VEIN; Right     Comment:  Procedure: ENDOVEIN HARVEST OF GREATER SAPHENOUS VEIN;                Surgeon: Ivin Poot, MD;  Location: [facility];                Service: Open Heart Surgery;  Laterality: Right; 02/11/2019: ESOPHAGOGASTRODUODENOSCOPY (EGD) WITH PROPOFOL; N/A  Comment:  Procedure: ESOPHAGOGASTRODUODENOSCOPY (EGD) WITH BIOPSY               and  Dilation;  Surgeon: [name], MD;  Location:               [facility];  Service: Endoscopy;  Laterality:               N/A; 10/2016: HIP SURGERY; Left     Comment:  left hip tendon repair 11/27/2017: LEFT HEART CATH AND CORONARY ANGIOGRAPHY; N/A     Comment:  Procedure: LEFT HEART CATH AND CORONARY ANGIOGRAPHY;                Surgeon: Wellington Hampshire, MD;  Location: [facility]               [facility];  Service:  Cardiovascular;  Laterality: N/A; 02/12/2018: LOWER EXTREMITY ANGIOGRAPHY; Left     Comment:  Procedure: LOWER EXTREMITY ANGIOGRAPHY;  Surgeon: Algernon Huxley, MD;  Location: [facility];  Service:               Cardiovascular;  Laterality: Left; 03/07/2018: LOWER EXTREMITY ANGIOGRAPHY; Left     Comment:  Procedure: LOWER EXTREMITY ANGIOGRAPHY;  Surgeon: Algernon Huxley, MD;  Location: [facility];  Service:               Cardiovascular;  Laterality: Left; 06/04/2018: LOWER EXTREMITY ANGIOGRAPHY; Left     Comment:  Procedure: LOWER EXTREMITY ANGIOGRAPHY;  Surgeon: Algernon Huxley, MD;  Location: [facility];  Service:               Cardiovascular;  Laterality: Left; 09/17/2020: LOWER EXTREMITY ANGIOGRAPHY; Left     Comment:  Procedure: LOWER EXTREMITY ANGIOGRAPHY;  Surgeon: Algernon Huxley, MD;  Location: [facility];  Service:               Cardiovascular;  Laterality: Left; 09/28/2020: LOWER EXTREMITY ANGIOGRAPHY; Left     Comment:  Procedure: LOWER EXTREMITY ANGIOGRAPHY;  Surgeon: Algernon Huxley, MD;  Location: [facility];  Service:               Cardiovascular;  Laterality: Left; 02/15/2021: LOWER EXTREMITY ANGIOGRAPHY; N/A     Comment:  Procedure: LOWER EXTREMITY ANGIOGRAPHY;  Surgeon: Algernon Huxley, MD;  Location: [facility];  Service:               Cardiovascular;  Laterality: N/A; 11/11/2019: PLACEMENT OF IMPELLA LEFT VENTRICULAR ASSIST DEVICE; N/A     Comment:  Procedure: PLACEMENT OF IMPELLA LEFT VENTRICULAR ASSIST               DEVICE 5.5;  Surgeon: Ivin Poot, MD;  Location: [facility];  Service: Open Heart Surgery;  Laterality: N/A;  Midline Sternotomy 09/13/2018: POLYPECTOMY; N/A     Comment:  Procedure: POLYPECTOMY;  Surgeon: [name], MD;                Location: [facility];  Service: Endoscopy;                 Laterality: N/A; 02/11/2019: POLYPECTOMY; N/A     Comment:  Procedure: POLYPECTOMY;  Surgeon: [name], MD;                Location: [facility];  Service: Endoscopy;                Laterality: N/A; 11/15/2019: REMOVAL OF IMPELLA LEFT VENTRICULAR ASSIST DEVICE; N/A     Comment:  Procedure: REMOVAL OF IMPELLA 5.5 LEFT VENTRICULAR               ASSIST DEVICE;  Surgeon: Ivin Poot, MD;  Location:              [facility];  Service: Open Heart Surgery;  Laterality: N/A; 11/05/2019: RIGHT/LEFT HEART CATH AND CORONARY ANGIOGRAPHY; N/A     Comment:  Procedure: RIGHT/LEFT HEART CATH AND CORONARY               ANGIOGRAPHY;  Surgeon: Nelva Bush, MD;  Location:               [facility];  Service: Cardiovascular;                Laterality: N/A; No date: SPINE SURGERY 11/11/2019: TEE WITHOUT CARDIOVERSION; N/A     Comment:  Procedure: TRANSESOPHAGEAL ECHOCARDIOGRAM (TEE);                Surgeon: Prescott Gum, Collier Salina, MD;  Location: [facility];                Service: Open Heart Surgery;  Laterality: N/A; 11/15/2019: TEE WITHOUT CARDIOVERSION; N/A     Comment:  Procedure: TRANSESOPHAGEAL ECHOCARDIOGRAM (TEE);                Surgeon: Prescott Gum, Collier Salina, MD;  Location: [facility];                Service: Open Heart Surgery;  Laterality: N/A; No date: TONSILLECTOMY BMI    Body Mass Index: 29.05 kg/m     Reproductive/Obstetrics negative OB ROS                             Anesthesia Physical Anesthesia Plan  ASA: 4  Anesthesia Plan: General   Post-op Pain Management:    Induction:   PONV Risk Score and Plan:   Airway Management Planned:   Additional Equipment:   Intra-op Plan:   Post-operative Plan:   Informed Consent: I have reviewed the patients History and Physical, chart, labs and discussed the procedure including the risks, benefits and alternatives for the proposed anesthesia with the patient or authorized representative who has indicated his/her  understanding and acceptance.     Dental Advisory Given  Plan Discussed with: CRNA  Anesthesia Plan Comments:         Anesthesia Quick Evaluation

## 2021-12-23 NOTE — Anesthesia Postprocedure Evaluation (Signed)
Anesthesia Post Note  Patient: [name]  Procedure(s) Performed: ESOPHAGOGASTRODUODENOSCOPY (EGD) WITH PROPOFOL  Patient location during evaluation: PACU Anesthesia Type: General Level of consciousness: awake and alert Pain management: pain level controlled Vital Signs Assessment: post-procedure vital signs reviewed and stable Respiratory status: spontaneous breathing, nonlabored ventilation, respiratory function stable and patient connected to nasal cannula oxygen Cardiovascular status: blood pressure returned to baseline and stable Postop Assessment: no apparent nausea or vomiting Anesthetic complications: no   No notable events documented.   Last Vitals:  Vitals:   12/23/21 0929 12/23/21 0930  BP: 118/87 118/87  Pulse: 75 73  Resp: 15 17  Temp:    SpO2: 94% 92%    Last Pain:  Vitals:   12/23/21 0929  TempSrc:   PainSc: 0-No pain                 Molli Barrows

## 2021-12-23 NOTE — H&P (Signed)
Ronald Lame, MD [facility] [address], [place], [place] [address] Phone:[phone] Fax : [phone]  Primary Care Physician:  Glean Hess, MD Primary Gastroenterologist:  Dr. Allen Norris  Pre-Procedure History & Physical: HPI:  [name] is a 71 y.o. male is here for an endoscopy.   Past Medical History:  Diagnosis Date   Abnormal nuclear cardiac imaging test 08/08/2015   Arthritis    fingers   Carotid artery occlusion    CHF (congestive heart failure) (HCC)    Coronary atherosclerosis of native coronary artery 01/29/2013   11/05/19 R/LHC 80% dLMCA stenosis small diffusely dz dLAD, chronically occluded OM1 50%mRCA lesion, widely patent mLCx strent, moderately elevated L heart filling pressures, mild to moderate RH filling pressures, normal to moderately reduced CO   Duodenal erosion    Encounter for screening for lung cancer 07/13/2016   Esophageal stenosis    esophageal dilation   GERD (gastroesophageal reflux disease)    H. pylori infection    Heart attack ([place]) Oct. 2009   Mild   Hiatal hernia    Hyperlipidemia    Hypertension    Old myocardial infarction 11/29/2007   Mildly elevated troponin, isolated value in October 2009. Cardiac catheterization-nonobstructive 60% RCA disease-subsequent nuclear stress test-9 minutes, low risk, mild inferior wall hypokinesis    Pain in limb 12/19/2017   Peripheral vascular disease ([place])    Unstable angina ([place]) 11/25/2017    Past Surgical History:  Procedure Laterality Date   APPENDECTOMY     BACK SURGERY     BIV ICD INSERTION CRT-D N/A 03/23/2020   Procedure: BIV ICD INSERTION CRT-D;  Surgeon: Vickie Epley, MD;  Location: [facility];  Service: Cardiovascular;  Laterality: N/A;   CARDIAC CATHETERIZATION N/A 08/07/2015   Procedure: Left Heart Cath and Coronary Angiography;  Surgeon: [name], MD;  Location: [facility];  Service: Cardiovascular;  Laterality: N/A;   CARDIAC CATHETERIZATION N/A 08/07/2015    Procedure: Coronary Stent Intervention;  Surgeon: [name], MD;  Location: [facility];  Service: Cardiovascular;  Laterality: N/A;   CARDIAC CATHETERIZATION N/A 08/07/2015   Procedure: Coronary Stent Intervention;  Surgeon: [name], MD;  Location: [facility];  Service: Cardiovascular;  Laterality: N/A;   CAROTID ENDARTERECTOMY  01/05/2006   Right  CEA with DPA   CATARACT EXTRACTION W/ INTRAOCULAR LENS IMPLANT Left 12/04/2017   CATARACT EXTRACTION W/PHACO Left 12/04/2017   Procedure: CATARACT EXTRACTION PHACO AND INTRAOCULAR LENS PLACEMENT ([place]) LEFT;  Surgeon: Eulogio Bear, MD;  Location: [facility];  Service: Ophthalmology;  Laterality: Left;   CATARACT EXTRACTION W/PHACO Right 02/06/2018   Procedure: CATARACT EXTRACTION PHACO AND INTRAOCULAR LENS PLACEMENT (I[facility])RIGHT;  Surgeon: Eulogio Bear, MD;  Location: [facility];  Service: Ophthalmology;  Laterality: Right;   COLONOSCOPY  05/20/2008   COLONOSCOPY WITH PROPOFOL N/A 09/13/2018   Procedure: COLONOSCOPY WITH BIOPSY;  Surgeon: Ronald Lame, MD;  Location: [facility];  Service: Endoscopy;  Laterality: N/A;   CORONARY ARTERY BYPASS GRAFT N/A 11/11/2019   Procedure: CORONARY ARTERY BYPASS GRAFTING (CABG) USING LIMA to Diag1; ENDOSCOPICALLY HARVESTED RIGHT GREATER SAPHENOUS VEIN: SVG to OM1; SVG to OM2; SVG to PDA.;  Surgeon: Ivin Poot, MD;  Location: [facility];  Service: Open Heart Surgery;  Laterality: N/A;   CORONARY STENT PLACEMENT  08/07/2015   MID CIRCUMFLEX   ENDARTERECTOMY FEMORAL Bilateral 09/30/2020   Procedure: ENDARTERECTOMY FEMORAL;  Surgeon: Algernon Huxley,  MD;  Location: [facility];  Service: Vascular;  Laterality: Bilateral;   ENDOVEIN HARVEST OF GREATER SAPHENOUS VEIN Right 11/11/2019   Procedure: ENDOVEIN HARVEST OF GREATER SAPHENOUS VEIN;  Surgeon: Ivin Poot, MD;  Location: [facility];  Service: Open Heart Surgery;  Laterality: Right;   ESOPHAGOGASTRODUODENOSCOPY  (EGD) WITH PROPOFOL N/A 02/11/2019   Procedure: ESOPHAGOGASTRODUODENOSCOPY (EGD) WITH BIOPSY and  Dilation;  Surgeon: Ronald Lame, MD;  Location: [facility];  Service: Endoscopy;  Laterality: N/A;   HIP SURGERY Left 10/2016   left hip tendon repair   LEFT HEART CATH AND CORONARY ANGIOGRAPHY N/A 11/27/2017   Procedure: LEFT HEART CATH AND CORONARY ANGIOGRAPHY;  Surgeon: Wellington Hampshire, MD;  Location: [facility];  Service: Cardiovascular;  Laterality: N/A;   LOWER EXTREMITY ANGIOGRAPHY Left 02/12/2018   Procedure: LOWER EXTREMITY ANGIOGRAPHY;  Surgeon: Algernon Huxley, MD;  Location: [facility];  Service: Cardiovascular;  Laterality: Left;   LOWER EXTREMITY ANGIOGRAPHY Left 03/07/2018   Procedure: LOWER EXTREMITY ANGIOGRAPHY;  Surgeon: Algernon Huxley, MD;  Location: [facility];  Service: Cardiovascular;  Laterality: Left;   LOWER EXTREMITY ANGIOGRAPHY Left 06/04/2018   Procedure: LOWER EXTREMITY ANGIOGRAPHY;  Surgeon: Algernon Huxley, MD;  Location: [facility];  Service: Cardiovascular;  Laterality: Left;   LOWER EXTREMITY ANGIOGRAPHY Left 09/17/2020   Procedure: LOWER EXTREMITY ANGIOGRAPHY;  Surgeon: Algernon Huxley, MD;  Location: [facility];  Service: Cardiovascular;  Laterality: Left;   LOWER EXTREMITY ANGIOGRAPHY Left 09/28/2020   Procedure: LOWER EXTREMITY ANGIOGRAPHY;  Surgeon: Algernon Huxley, MD;  Location: [facility];  Service: Cardiovascular;  Laterality: Left;   LOWER EXTREMITY ANGIOGRAPHY N/A 02/15/2021   Procedure: LOWER EXTREMITY ANGIOGRAPHY;  Surgeon: Algernon Huxley, MD;  Location: [facility];  Service: Cardiovascular;  Laterality: N/A;   PLACEMENT OF IMPELLA LEFT VENTRICULAR ASSIST DEVICE N/A 11/11/2019   Procedure: PLACEMENT OF IMPELLA LEFT VENTRICULAR ASSIST DEVICE 5.5;  Surgeon: Ivin Poot, MD;  Location: [facility];  Service: Open Heart Surgery;  Laterality: N/A;  Midline Sternotomy   POLYPECTOMY N/A 09/13/2018    Procedure: POLYPECTOMY;  Surgeon: Ronald Lame, MD;  Location: [facility];  Service: Endoscopy;  Laterality: N/A;   POLYPECTOMY N/A 02/11/2019   Procedure: POLYPECTOMY;  Surgeon: Ronald Lame, MD;  Location: [facility];  Service: Endoscopy;  Laterality: N/A;   REMOVAL OF IMPELLA LEFT VENTRICULAR ASSIST DEVICE N/A 11/15/2019   Procedure: REMOVAL OF IMPELLA 5.5 LEFT VENTRICULAR ASSIST DEVICE;  Surgeon: Ivin Poot, MD;  Location: [facility];  Service: Open Heart Surgery;  Laterality: N/A;   RIGHT/LEFT HEART CATH AND CORONARY ANGIOGRAPHY N/A 11/05/2019   Procedure: RIGHT/LEFT HEART CATH AND CORONARY ANGIOGRAPHY;  Surgeon: Nelva Bush, MD;  Location: [place] [facility];  Service: Cardiovascular;  Laterality: N/A;   SPINE SURGERY     TEE WITHOUT CARDIOVERSION N/A 11/11/2019   Procedure: TRANSESOPHAGEAL ECHOCARDIOGRAM (TEE);  Surgeon: Prescott Gum, Collier Salina, MD;  Location: [facility];  Service: Open Heart Surgery;  Laterality: N/A;   TEE WITHOUT CARDIOVERSION N/A 11/15/2019   Procedure: TRANSESOPHAGEAL ECHOCARDIOGRAM (TEE);  Surgeon: Prescott Gum, Collier Salina, MD;  Location: [place];  Service: Open Heart Surgery;  Laterality: N/A;   TONSILLECTOMY      Prior to Admission medications   Medication Sig Start Date End Date Taking? Authorizing Provider  aspirin EC 81 MG tablet Take 1 tablet (81 mg total) by mouth daily. 09/17/20  Yes Dew, Erskine Squibb,  MD  carvedilol (COREG) 12.5 MG tablet TAKE (1) TABLET BY MOUTH TWICE DAILY 10/25/21  Yes End, Harrell Gave, MD  colchicine 0.6 MG tablet TAKE 1 TABLET BY MOUTH 2 TIMES DAILY. 10/01/21  Yes Glean Hess, MD  ELIQUIS 5 MG TABS tablet TAKE 1 TABLET BY MOUTH TWICE A DAY 07/05/21  Yes Kris Hartmann, NP  mexiletine (MEXITIL) 150 MG capsule Take 1 capsule (150 mg total) by mouth 2 (two) times daily. 12/22/21  Yes Vickie Epley, MD  nitroGLYCERIN (NITROSTAT) 0.4 MG SL tablet Place 1 tablet (0.4 mg total) under the tongue every 5 (five) minutes as needed for chest  pain. 08/08/15  Yes Kilroy, Luke K, PA-C  PRALUENT 150 MG/ML SOAJ Inject 150 mg into the skin every 14 (fourteen) days. 07/29/21  Yes [provider]  sacubitril-valsartan (ENTRESTO) 49-51 MG Take 1 tablet by mouth 2 (two) times daily. Need to make an appointment for further refills 01/13/21 01/08/22 Yes End, Harrell Gave, MD  traZODone (DESYREL) 50 MG tablet Take 1 tablet (50 mg total) by mouth at bedtime as needed. for sleep 09/22/21  Yes Glean Hess, MD    Allergies as of 11/24/2021 - Review Complete 10/05/2021  Allergen Reaction Noted   Brilinta [ticagrelor] Shortness Of Breath 08/24/2015   Chlorhexidine gluconate Other (See Comments) 11/12/2019   Contrast media [iodinated contrast media] Itching 09/17/2020   Statins Other (See Comments) 01/29/2013   Zetia [ezetimibe] Other (See Comments) 01/29/2013    Family History  Problem Relation Age of Onset   Heart attack Mother    Coronary artery disease Mother    Heart disease Mother        Carotid Stenosis and BPG and Heart Disease before age 39   Diabetes Mother    Hypertension Mother    Heart attack Father    Heart disease Father        BPG and Heart Disease before age 84   Hypertension Father    Cancer Father 76       throat   Stroke Father    Colon cancer Neg Hx    Colon polyps Neg Hx    Esophageal cancer Neg Hx    Rectal cancer Neg Hx    Stomach cancer Neg Hx     Social History   Socioeconomic History   Marital status: Married    Spouse name: Not on file   Number of children: 1   Years of education: Not on file   Highest education level: Bachelor's degree (e.g., BA, AB, BS)  Occupational History   Not on file  Tobacco Use   Smoking status: Former    Packs/day: 1.25    Years: 35.00    Total pack years: 43.75    Types: Cigarettes    Quit date: 02/28/2005    Years since quitting: 16.8   Smokeless tobacco: Current    Types: Snuff   Tobacco comments:    occaisionally  Vaping Use   Vaping Use: Never  used  Substance and Sexual Activity   Alcohol use: Yes    Alcohol/week: 8.0 - 10.0 standard drinks of alcohol    Types: 8 - 10 Glasses of wine per week    Comment: weekly   Drug use: No   Sexual activity: Yes  Other Topics Concern   Not on file  Social History Narrative   Not on file   Social Determinants of Health   Financial Resource Strain: Low Risk  (09/23/2020)   Overall Financial Resource  Strain (CARDIA)    Difficulty of Paying Living Expenses: Not hard at all  Food Insecurity: No Food Insecurity (09/27/2021)   Hunger Vital Sign    Worried About Running Out of Food in the Last Year: Never true    Ran Out of Food in the Last Year: Never true  Transportation Needs: No Transportation Needs (09/27/2021)   PRAPARE - Hydrologist (Medical): No    Lack of Transportation (Non-Medical): No  Recent Concern: Transportation Needs - Unmet Transportation Needs (09/27/2021)   PRAPARE - Transportation    Lack of Transportation (Medical): Yes    Lack of Transportation (Non-Medical): Yes  Physical Activity: Insufficiently Active (09/23/2020)   Exercise Vital Sign    Days of Exercise per Week: 2 days    Minutes of Exercise per Session: 60 min  Stress: No Stress Concern Present (09/23/2020)   [place]    Feeling of Stress : Not at all  Social Connections: Moderately Integrated (09/23/2020)   Social Connection and Isolation Panel [NHANES]    Frequency of Communication with Friends and Family: More than three times a week    Frequency of Social Gatherings with Friends and Family: Three times a week    Attends Religious Services: Never    Active Member of Clubs or Organizations: Yes    Attends Archivist Meetings: More than 4 times per year    Marital Status: Married  Human resources officer Violence: Not At Risk (09/23/2020)   Humiliation, Afraid, Rape, and Kick questionnaire    Fear of Current  or Ex-Partner: No    Emotionally Abused: No    Physically Abused: No    Sexually Abused: No    Review of Systems: See HPI, otherwise negative ROS  Physical Exam: BP 134/67   Pulse 61   Temp (!) 96.6 F (35.9 C) (Temporal)   Resp 20   Ht '5\' 6"'$  (1.676 m)   Wt 81.6 kg   SpO2 95%   BMI 29.05 kg/m  General:   Alert,  pleasant and cooperative in NAD Head:  Normocephalic and atraumatic. Neck:  Supple; no masses or thyromegaly. Lungs:  Clear throughout to auscultation.    Heart:  Regular rate and rhythm. Abdomen:  Soft, nontender and nondistended. Normal bowel sounds, without guarding, and without rebound.   Neurologic:  Alert and  oriented x4;  grossly normal neurologically.  Impression/Plan: [name] is here for an endoscopy to be performed for dysphagia  Risks, benefits, limitations, and alternatives regarding  endoscopy have been reviewed with the patient.  Questions have been answered.  All parties agreeable.   Ronald Lame, MD  12/23/2021, 8:25 AM

## 2021-12-23 NOTE — Op Note (Signed)
[facility] Gastroenterology Patient Name: [name] Procedure Date: 12/23/2021 8:54 AM MRN: [id number] Account #: [id number] Date of Birth: 03-15-1950 Admit Type: Outpatient Age: 71 Room: [facility] ENDO ROOM 4 Gender: Male Note Status: Finalized Instrument Name: Upper Endoscope [id number] Procedure:             Upper GI endoscopy Indications:           Dysphagia Providers:             [name] MD, MD Referring MD:          Halina Maidens, MD (Referring MD) Medicines:             Propofol per Anesthesia Complications:         No immediate complications. Procedure:             Pre-Anesthesia Assessment:                        - Prior to the procedure, a History and Physical was                         performed, and patient medications and allergies were                         reviewed. The patient's tolerance of previous                         anesthesia was also reviewed. The risks and benefits                         of the procedure and the sedation options and risks                         were discussed with the patient. All questions were                         answered, and informed consent was obtained. Prior                         Anticoagulants: The patient has taken no anticoagulant                         or antiplatelet agents. ASA Grade Assessment: II - A                         patient with mild systemic disease. After reviewing                         the risks and benefits, the patient was deemed in                         satisfactory condition to undergo the procedure.                        After obtaining informed consent, the endoscope was                         passed under direct vision. Throughout the procedure,  the patient's blood pressure, pulse, and oxygen                         saturations were monitored continuously. The Endoscope                         was introduced through the mouth, and advanced to the                          second part of duodenum. The upper GI endoscopy was                         accomplished without difficulty. The patient tolerated                         the procedure well. Findings:      One benign-appearing, intrinsic moderate stenosis was found at the       gastroesophageal junction. The stenosis was traversed. A TTS dilator was       passed through the scope. Dilation with a 12-13.5-15 mm balloon dilator       was performed to 15 mm. The dilation site was examined following       endoscope reinsertion and showed moderate improvement in luminal       narrowing.      The stomach was normal.      The examined duodenum was normal. Impression:            - Benign-appearing esophageal stenosis. Dilated.                        - Normal stomach.                        - Normal examined duodenum.                        - No specimens collected. Recommendation:        - Discharge patient to home.                        - Resume previous diet.                        - Continue present medications.                        - Repeat upper endoscopy PRN for retreatment. Procedure Code(s):     --- Professional ---                        [phone], Esophagogastroduodenoscopy, flexible,                         transoral; with transendoscopic balloon dilation of                         esophagus (less than 30 mm diameter) Diagnosis Code(s):     --- Professional ---                        R13.10, Dysphagia, unspecified  K22.2, Esophageal obstruction CPT copyright 2022 American Medical Association. All rights reserved. The codes documented in this report are preliminary and upon coder review may  be revised to meet current compliance requirements. [name] MD, MD 12/23/2021 9:15:16 AM This report has been signed electronically. Number of Addenda: 0 Note Initiated On: 12/23/2021 8:54 AM Estimated Blood Loss:  Estimated blood loss: none.      [facility]

## 2021-12-23 NOTE — Telephone Encounter (Signed)
Spoke with patient's spouse and he is scheduled with Dr. Lucky Cowboy on 12/27/21 with a 11:45 am arrival time to the MM. Pre-procedure instructions were discussed and patient's spouse stated she understood.

## 2021-12-23 NOTE — Transfer of Care (Signed)
Immediate Anesthesia Transfer of Care Note  Patient: [name]  Procedure(s) Performed: ESOPHAGOGASTRODUODENOSCOPY (EGD) WITH PROPOFOL  Patient Location: PACU  Anesthesia Type:General  Level of Consciousness: awake  Airway & Oxygen Therapy: Patient Spontanous Breathing  Post-op Assessment: Report given to RN and Post -op Vital signs reviewed and stable  Post vital signs: Reviewed and stable  Last Vitals:  Vitals Value Taken Time  BP 126/79 0920  Temp 35.8 0920  Pulse 73 12/23/21 0920  Resp 21 12/23/21 0920  SpO2 98 % 12/23/21 0920  Vitals shown include unvalidated device data.  Last Pain:  Vitals:   12/23/21 0756  TempSrc: Temporal  PainSc: 0-No pain         Complications: No notable events documented.

## 2021-12-24 ENCOUNTER — Encounter: Payer: Self-pay | Admitting: Gastroenterology

## 2021-12-27 ENCOUNTER — Other Ambulatory Visit: Payer: Self-pay

## 2021-12-27 ENCOUNTER — Inpatient Hospital Stay
Admission: AD | Admit: 2021-12-27 | Discharge: 2021-12-29 | DRG: 272 | Disposition: A | Discharge status: Home / Self Care | Payer: PPO | Attending: Vascular Surgery | Admitting: Vascular Surgery

## 2021-12-27 ENCOUNTER — Encounter: Payer: Self-pay | Admitting: Vascular Surgery

## 2021-12-27 ENCOUNTER — Encounter (INDEPENDENT_AMBULATORY_CARE_PROVIDER_SITE_OTHER): Payer: Self-pay

## 2021-12-27 ENCOUNTER — Encounter
Admission: AD | Disposition: A | Discharge status: Home / Self Care | Payer: Self-pay | Source: Home / Self Care | Attending: Vascular Surgery

## 2021-12-27 ENCOUNTER — Encounter (INDEPENDENT_AMBULATORY_CARE_PROVIDER_SITE_OTHER): Payer: Self-pay | Admitting: Nurse Practitioner

## 2021-12-27 DIAGNOSIS — Z833 Family history of diabetes mellitus: Secondary | ICD-10-CM

## 2021-12-27 DIAGNOSIS — I743 Embolism and thrombosis of arteries of the lower extremities: Secondary | ICD-10-CM | POA: Diagnosis not present

## 2021-12-27 DIAGNOSIS — I70222 Atherosclerosis of native arteries of extremities with rest pain, left leg: Principal | ICD-10-CM | POA: Diagnosis present

## 2021-12-27 DIAGNOSIS — Z8249 Family history of ischemic heart disease and other diseases of the circulatory system: Secondary | ICD-10-CM

## 2021-12-27 DIAGNOSIS — Z7901 Long term (current) use of anticoagulants: Secondary | ICD-10-CM

## 2021-12-27 DIAGNOSIS — T82868A Thrombosis of vascular prosthetic devices, implants and grafts, initial encounter: Secondary | ICD-10-CM | POA: Diagnosis not present

## 2021-12-27 DIAGNOSIS — I110 Hypertensive heart disease with heart failure: Secondary | ICD-10-CM | POA: Diagnosis present

## 2021-12-27 DIAGNOSIS — F17290 Nicotine dependence, other tobacco product, uncomplicated: Secondary | ICD-10-CM | POA: Diagnosis not present

## 2021-12-27 DIAGNOSIS — I509 Heart failure, unspecified: Secondary | ICD-10-CM | POA: Diagnosis not present

## 2021-12-27 DIAGNOSIS — Z91041 Radiographic dye allergy status: Secondary | ICD-10-CM

## 2021-12-27 DIAGNOSIS — I2510 Atherosclerotic heart disease of native coronary artery without angina pectoris: Secondary | ICD-10-CM | POA: Diagnosis not present

## 2021-12-27 DIAGNOSIS — Z9889 Other specified postprocedural states: Secondary | ICD-10-CM

## 2021-12-27 DIAGNOSIS — I998 Other disorder of circulatory system: Secondary | ICD-10-CM | POA: Diagnosis present

## 2021-12-27 DIAGNOSIS — I252 Old myocardial infarction: Secondary | ICD-10-CM

## 2021-12-27 DIAGNOSIS — K219 Gastro-esophageal reflux disease without esophagitis: Secondary | ICD-10-CM | POA: Diagnosis present

## 2021-12-27 DIAGNOSIS — Z951 Presence of aortocoronary bypass graft: Secondary | ICD-10-CM

## 2021-12-27 DIAGNOSIS — I70219 Atherosclerosis of native arteries of extremities with intermittent claudication, unspecified extremity: Secondary | ICD-10-CM

## 2021-12-27 DIAGNOSIS — Z823 Family history of stroke: Secondary | ICD-10-CM

## 2021-12-27 DIAGNOSIS — T82856A Stenosis of peripheral vascular stent, initial encounter: Secondary | ICD-10-CM

## 2021-12-27 DIAGNOSIS — E785 Hyperlipidemia, unspecified: Secondary | ICD-10-CM | POA: Diagnosis not present

## 2021-12-27 DIAGNOSIS — Z79899 Other long term (current) drug therapy: Secondary | ICD-10-CM | POA: Diagnosis not present

## 2021-12-27 DIAGNOSIS — Z955 Presence of coronary angioplasty implant and graft: Secondary | ICD-10-CM

## 2021-12-27 DIAGNOSIS — Z7982 Long term (current) use of aspirin: Secondary | ICD-10-CM | POA: Diagnosis not present

## 2021-12-27 DIAGNOSIS — Z87891 Personal history of nicotine dependence: Secondary | ICD-10-CM | POA: Diagnosis not present

## 2021-12-27 DIAGNOSIS — Z808 Family history of malignant neoplasm of other organs or systems: Secondary | ICD-10-CM | POA: Diagnosis not present

## 2021-12-27 DIAGNOSIS — Z888 Allergy status to other drugs, medicaments and biological substances status: Secondary | ICD-10-CM

## 2021-12-27 HISTORY — PX: LOWER EXTREMITY ANGIOGRAPHY: CATH118251

## 2021-12-27 HISTORY — DX: Other disorder of circulatory system: I99.8

## 2021-12-27 LAB — CBC
HCT: 45.8 % (ref 39.0–52.0)
Hemoglobin: 15 g/dL (ref 13.0–17.0)
MCH: 31.8 pg (ref 26.0–34.0)
MCHC: 32.8 g/dL (ref 30.0–36.0)
MCV: 97.2 fL (ref 80.0–100.0)
Platelets: 138 10*3/uL — ABNORMAL LOW (ref 150–400)
RBC: 4.71 MIL/uL (ref 4.22–5.81)
RDW: 13.1 % (ref 11.5–15.5)
WBC: 2.8 10*3/uL — ABNORMAL LOW (ref 4.0–10.5)
nRBC: 0 % (ref 0.0–0.2)

## 2021-12-27 LAB — BUN: BUN: 36 mg/dL — ABNORMAL HIGH (ref 8–23)

## 2021-12-27 LAB — CREATININE, SERUM
Creatinine, Ser: 0.99 mg/dL (ref 0.61–1.24)
GFR, Estimated: >60 mL/min (ref >60)

## 2021-12-27 LAB — HEPARIN LEVEL (UNFRACTIONATED): Heparin Unfractionated: >1.1 IU/mL — ABNORMAL HIGH (ref 0.30–0.70)

## 2021-12-27 LAB — FIBRINOGEN: Fibrinogen: 240 mg/dL (ref 210–475)

## 2021-12-27 LAB — MRSA NEXT GEN BY PCR, NASAL: MRSA by PCR Next Gen: NOT DETECTED

## 2021-12-27 SURGERY — LOWER EXTREMITY ANGIOGRAPHY
Anesthesia: Moderate Sedation | Site: Leg Lower | Laterality: Left

## 2021-12-27 MED ORDER — FAMOTIDINE 20 MG PO TABS: 40.0000 mg | ORAL_TABLET | Freq: Once | ORAL | Status: AC | PRN

## 2021-12-27 MED ORDER — SODIUM CHLORIDE 0.9 % IV SOLN: 250.0000 mL | INTRAVENOUS | Status: DC | PRN

## 2021-12-27 MED ORDER — MIDAZOLAM HCL 2 MG/2ML IJ SOLN
INTRAMUSCULAR | Status: AC
  Filled 2021-12-27: qty 4

## 2021-12-27 MED ORDER — SODIUM CHLORIDE 0.9 % IV SOLN: INTRAVENOUS | Status: DC

## 2021-12-27 MED ORDER — ONDANSETRON HCL 4 MG/2ML IJ SOLN: 4.0000 mg | Freq: Four times a day (QID) | INTRAMUSCULAR | Status: DC | PRN

## 2021-12-27 MED ORDER — ASPIRIN 81 MG PO TBEC
81.0000 mg | DELAYED_RELEASE_TABLET | Freq: Every day | ORAL | Status: DC
  Administered 2021-12-27 – 2021-12-29 (×3): 81 mg via ORAL
  Filled 2021-12-27 (×4): qty 1

## 2021-12-27 MED ORDER — SACUBITRIL-VALSARTAN 49-51 MG PO TABS
1.0000 | ORAL_TABLET | Freq: Two times a day (BID) | ORAL | Status: DC
  Administered 2021-12-27 – 2021-12-29 (×3): 1 via ORAL
  Filled 2021-12-27 (×4): qty 1

## 2021-12-27 MED ORDER — STERILE WATER FOR INJECTION IJ SOLN
INTRAMUSCULAR | Status: AC
  Filled 2021-12-27: qty 10

## 2021-12-27 MED ORDER — HEPARIN SODIUM (PORCINE) 1000 UNIT/ML IJ SOLN
INTRAMUSCULAR | Status: AC
  Filled 2021-12-27: qty 10

## 2021-12-27 MED ORDER — SODIUM CHLORIDE 0.9 % IV SOLN
1.0000 mg/h | INTRAVENOUS | Status: DC
  Administered 2021-12-27 – 2021-12-28 (×2): 1 mg/h
  Filled 2021-12-27 (×6): qty 10

## 2021-12-27 MED ORDER — FENTANYL CITRATE (PF) 100 MCG/2ML IJ SOLN
INTRAMUSCULAR | Status: AC
  Filled 2021-12-27: qty 2

## 2021-12-27 MED ORDER — METHYLPREDNISOLONE SODIUM SUCC 125 MG IJ SOLR: 125.0000 mg | Freq: Once | INTRAMUSCULAR | Status: AC | PRN

## 2021-12-27 MED ORDER — MIDAZOLAM HCL 2 MG/2ML IJ SOLN: 1.0000 mg | INTRAMUSCULAR | Status: DC | PRN

## 2021-12-27 MED ORDER — HEPARIN (PORCINE) 25000 UT/250ML-% IV SOLN: 600.0000 [IU]/h | INTRAVENOUS | Status: DC

## 2021-12-27 MED ORDER — CARVEDILOL 12.5 MG PO TABS
12.5000 mg | ORAL_TABLET | Freq: Two times a day (BID) | ORAL | Status: DC
  Administered 2021-12-28 – 2021-12-29 (×2): 12.5 mg via ORAL
  Filled 2021-12-27 (×3): qty 1

## 2021-12-27 MED ORDER — ALIROCUMAB 150 MG/ML ~~LOC~~ SOAJ: 150.0000 mg | SUBCUTANEOUS | Status: DC

## 2021-12-27 MED ORDER — FENTANYL CITRATE (PF) 100 MCG/2ML IJ SOLN
INTRAMUSCULAR | Status: DC | PRN
  Administered 2021-12-27: 50 ug via INTRAVENOUS
  Administered 2021-12-27: 25 ug via INTRAVENOUS

## 2021-12-27 MED ORDER — MEXILETINE HCL 150 MG PO CAPS
150.0000 mg | ORAL_CAPSULE | Freq: Two times a day (BID) | ORAL | Status: DC
  Administered 2021-12-27 – 2021-12-29 (×3): 150 mg via ORAL
  Filled 2021-12-27 (×4): qty 1

## 2021-12-27 MED ORDER — MORPHINE SULFATE (PF) 4 MG/ML IV SOLN: 5.0000 mg | INTRAVENOUS | Status: DC | PRN

## 2021-12-27 MED ORDER — MIDAZOLAM HCL 2 MG/ML PO SYRP: 8.0000 mg | ORAL_SOLUTION | Freq: Once | ORAL | Status: DC | PRN

## 2021-12-27 MED ORDER — ALTEPLASE 1 MG/ML SYRINGE FOR VASCULAR PROCEDURE
INTRAMUSCULAR | Status: DC | PRN
  Administered 2021-12-27: 6 mg via INTRA_ARTERIAL

## 2021-12-27 MED ORDER — DIPHENHYDRAMINE HCL 50 MG/ML IJ SOLN
INTRAMUSCULAR | Status: AC
  Administered 2021-12-27: 50 mg via INTRAVENOUS
  Filled 2021-12-27: qty 1

## 2021-12-27 MED ORDER — IODIXANOL 320 MG/ML IV SOLN
INTRAVENOUS | Status: DC | PRN
  Administered 2021-12-27: 50 mL

## 2021-12-27 MED ORDER — FAMOTIDINE 20 MG PO TABS
ORAL_TABLET | ORAL | Status: AC
  Administered 2021-12-27: 40 mg via ORAL
  Filled 2021-12-27: qty 2

## 2021-12-27 MED ORDER — CEFAZOLIN SODIUM-DEXTROSE 2-4 GM/100ML-% IV SOLN
INTRAVENOUS | Status: AC
  Administered 2021-12-27: 2 g via INTRAVENOUS
  Filled 2021-12-27: qty 100

## 2021-12-27 MED ORDER — HYDROMORPHONE HCL 1 MG/ML IJ SOLN
1.0000 mg | Freq: Once | INTRAMUSCULAR | Status: AC | PRN
  Administered 2021-12-27: 1 mg via INTRAVENOUS
  Filled 2021-12-27: qty 1

## 2021-12-27 MED ORDER — TRAZODONE HCL 50 MG PO TABS
50.0000 mg | ORAL_TABLET | Freq: Every evening | ORAL | Status: DC | PRN
  Administered 2021-12-27 – 2021-12-28 (×2): 50 mg via ORAL
  Filled 2021-12-27 (×2): qty 1

## 2021-12-27 MED ORDER — HEPARIN (PORCINE) 25000 UT/250ML-% IV SOLN
INTRAVENOUS | Status: AC
  Administered 2021-12-27: 800 [IU]/h via INTRAVENOUS
  Filled 2021-12-27: qty 250

## 2021-12-27 MED ORDER — DIPHENHYDRAMINE HCL 50 MG/ML IJ SOLN: 50.0000 mg | Freq: Once | INTRAMUSCULAR | Status: AC | PRN

## 2021-12-27 MED ORDER — METHYLPREDNISOLONE SODIUM SUCC 125 MG IJ SOLR
INTRAMUSCULAR | Status: AC
  Administered 2021-12-27: 125 mg via INTRAVENOUS
  Filled 2021-12-27: qty 2

## 2021-12-27 MED ORDER — SODIUM CHLORIDE 0.9% FLUSH
3.0000 mL | Freq: Two times a day (BID) | INTRAVENOUS | Status: DC
  Administered 2021-12-27 – 2021-12-29 (×3): 3 mL via INTRAVENOUS

## 2021-12-27 MED ORDER — HEPARIN SODIUM (PORCINE) 1000 UNIT/ML IJ SOLN
INTRAMUSCULAR | Status: DC | PRN
  Administered 2021-12-27: 5000 [IU] via INTRAVENOUS

## 2021-12-27 MED ORDER — SODIUM CHLORIDE 0.9% FLUSH: 3.0000 mL | INTRAVENOUS | Status: DC | PRN

## 2021-12-27 MED ORDER — CEFAZOLIN SODIUM-DEXTROSE 2-4 GM/100ML-% IV SOLN: 2.0000 g | INTRAVENOUS | Status: AC

## 2021-12-27 MED ORDER — MIDAZOLAM HCL 2 MG/2ML IJ SOLN
INTRAMUSCULAR | Status: DC | PRN
  Administered 2021-12-27: 2 mg via INTRAVENOUS
  Administered 2021-12-27: 1 mg via INTRAVENOUS

## 2021-12-27 SURGICAL SUPPLY — 26 items
BALLN LUTONIX 018 5X220X130 (BALLOONS) ×1
BALLN ULTRVRSE 3X150X150 (BALLOONS) ×1
BALLN ULTRVRSE 6X150X130 (BALLOONS) ×1
BALLOON LUTONIX 018 5X220X130 (BALLOONS) IMPLANT
BALLOON ULTRVRSE 3X150X150 (BALLOONS) IMPLANT
BALLOON ULTRVRSE 6X150X130 (BALLOONS) IMPLANT
CANISTER PENUMBRA ENGINE (MISCELLANEOUS) IMPLANT
CANNULA 5F STIFF (CANNULA) IMPLANT
CATH ANGIO 5F PIGTAIL 65CM (CATHETERS) IMPLANT
CATH INFUS 135CMX30CM (CATHETERS) IMPLANT
CATH LIGHTNING BOLT 7 130 (CATHETERS) IMPLANT
CATH SEEKER .035X150CM (CATHETERS) IMPLANT
CATH VERT 5X100 (CATHETERS) IMPLANT
COVER PROBE ULTRASOUND 5X96 (MISCELLANEOUS) IMPLANT
GLIDEWIRE ADV .035X260CM (WIRE) IMPLANT
KIT CV MULTILUMEN 7FR 20 (SET/KITS/TRAYS/PACK) ×1
KIT CV MULTILUMEN 7FR 20 SUB (SET/KITS/TRAYS/PACK) IMPLANT
KIT ENCORE 26 ADVANTAGE (KITS) IMPLANT
PACK ANGIOGRAPHY (CUSTOM PROCEDURE TRAY) ×2 IMPLANT
SHEATH BRITE TIP 5FRX11 (SHEATH) IMPLANT
SHEATH RAABE 7FR (SHEATH) IMPLANT
SUT PROLENE 0 CT 1 30 (SUTURE) IMPLANT
SYR MEDRAD MARK 7 150ML (SYRINGE) IMPLANT
TUBING CONTRAST HIGH PRESS 72 (TUBING) IMPLANT
WIRE G V18X300CM (WIRE) IMPLANT
WIRE GUIDERIGHT .035X150 (WIRE) IMPLANT

## 2021-12-27 NOTE — H&P (View-Only) (Signed)
Subjective:    Patient ID: [name], male    DOB: [birth year], 71 y.o.   MRN: [id number] No chief complaint on file.   [name] is a 71 year old male who presents today for follow-up evaluation of his peripheral arterial disease.  He notes that over the last several weeks he notes his claudication distance has gotten much more severe and recently he has been having numbness and tingling in his left lower extremity.  The symptoms are consistent with previous arterial occlusions that he has had.  He denies any rest pain currently.  He does note that less than 50 feet he begins to have significant claudication-like symptoms.  The patient does not smoke but he does continue to use snuff occasionally.  Today noninvasive studies show an occlusion of the left distal SFA and distal popliteal.  The patient is currently restored in the tibial vessels by blunted collaterals.    Review of Systems  Cardiovascular:        Claudication  Skin:  Negative for wound.  All other systems reviewed and are negative.      Objective:   Physical Exam Vitals reviewed.  HENT:     Head: Normocephalic.  Cardiovascular:     Rate and Rhythm: Normal rate.  Pulmonary:     Effort: Pulmonary effort is normal.  Skin:    General: Skin is warm and dry.  Neurological:     Mental Status: He is alert and oriented to person, place, and time.  Psychiatric:        Mood and Affect: Mood normal.        Behavior: Behavior normal.        Thought Content: Thought content normal.        Judgment: Judgment normal.     BP (!) 133/90 (BP Location: Right Arm)   Pulse 83   Resp 17   Ht '5\' 6"'$  (1.676 m)   Wt 181 lb 9.6 oz (82.4 kg)   BMI 29.31 kg/m   Past Medical History:  Diagnosis Date   Abnormal nuclear cardiac imaging test 08/08/2015   Arthritis    fingers   Carotid artery occlusion    CHF (congestive heart failure) (HCC)    Coronary atherosclerosis of native coronary artery 01/29/2013   11/05/19 R/LHC 80%  dLMCA stenosis small diffusely dz dLAD, chronically occluded OM1 50%mRCA lesion, widely patent mLCx strent, moderately elevated L heart filling pressures, mild to moderate RH filling pressures, normal to moderately reduced CO   Duodenal erosion    Encounter for screening for lung cancer 07/13/2016   Esophageal stenosis    esophageal dilation   GERD (gastroesophageal reflux disease)    H. pylori infection    Heart attack ([place]) Oct. 2009   Mild   Hiatal hernia    Hyperlipidemia    Hypertension    Old myocardial infarction 11/29/2007   Mildly elevated troponin, isolated value in October 2009. Cardiac catheterization-nonobstructive 60% RCA disease-subsequent nuclear stress test-9 minutes, low risk, mild inferior wall hypokinesis    Pain in limb 12/19/2017   Peripheral vascular disease ([place])    Unstable angina ([place]) 11/25/2017    Social History   Socioeconomic History   Marital status: Married    Spouse name: Not on file   Number of children: 1   Years of education: Not on file   Highest education level: Bachelor's degree (e.g., BA, AB, BS)  Occupational History   Not on file  Tobacco Use   Smoking status:  Former    Packs/day: 1.25    Years: 35.00    Total pack years: 43.75    Types: Cigarettes    Quit date: 02/28/2005    Years since quitting: 16.8   Smokeless tobacco: Current    Types: Snuff   Tobacco comments:    occaisionally  Vaping Use   Vaping Use: Never used  Substance and Sexual Activity   Alcohol use: Yes    Alcohol/week: 8.0 - 10.0 standard drinks of alcohol    Types: 8 - 10 Glasses of wine per week    Comment: weekly   Drug use: No   Sexual activity: Yes  Other Topics Concern   Not on file  Social History Narrative   Not on file   Social Determinants of Health   Financial Resource Strain: Low Risk  (09/23/2020)   Overall Financial Resource Strain (CARDIA)    Difficulty of Paying Living Expenses: Not hard at all  Food Insecurity: No Food Insecurity  (09/27/2021)   Hunger Vital Sign    Worried About Running Out of Food in the Last Year: Never true    Ran Out of Food in the Last Year: Never true  Transportation Needs: No Transportation Needs (09/27/2021)   PRAPARE - Hydrologist (Medical): No    Lack of Transportation (Non-Medical): No  Recent Concern: Transportation Needs - Unmet Transportation Needs (09/27/2021)   PRAPARE - Transportation    Lack of Transportation (Medical): Yes    Lack of Transportation (Non-Medical): Yes  Physical Activity: Insufficiently Active (09/23/2020)   Exercise Vital Sign    Days of Exercise per Week: 2 days    Minutes of Exercise per Session: 60 min  Stress: No Stress Concern Present (09/23/2020)   [place]    Feeling of Stress : Not at all  Social Connections: Moderately Integrated (09/23/2020)   Social Connection and Isolation Panel [NHANES]    Frequency of Communication with Friends and Family: More than three times a week    Frequency of Social Gatherings with Friends and Family: Three times a week    Attends Religious Services: Never    Active Member of Clubs or Organizations: Yes    Attends Archivist Meetings: More than 4 times per year    Marital Status: Married  Human resources officer Violence: Not At Risk (09/23/2020)   Humiliation, Afraid, Rape, and Kick questionnaire    Fear of Current or Ex-Partner: No    Emotionally Abused: No    Physically Abused: No    Sexually Abused: No    Past Surgical History:  Procedure Laterality Date   APPENDECTOMY     BACK SURGERY     BIV ICD INSERTION CRT-D N/A 03/23/2020   Procedure: BIV ICD INSERTION CRT-D;  Surgeon: Vickie Epley, MD;  Location: [place] CV [facility];  Service: Cardiovascular;  Laterality: N/A;   CARDIAC CATHETERIZATION N/A 08/07/2015   Procedure: Left Heart Cath and Coronary Angiography;  Surgeon: [name], MD;  Location: [place]  CV LAB;  Service: Cardiovascular;  Laterality: N/A;   CARDIAC CATHETERIZATION N/A 08/07/2015   Procedure: Coronary Stent Intervention;  Surgeon: [name], MD;  Location: [place] [facility];  Service: Cardiovascular;  Laterality: N/A;   CARDIAC CATHETERIZATION N/A 08/07/2015   Procedure: Coronary Stent Intervention;  Surgeon: [name], MD;  Location: [place] CV [facility];  Service: Cardiovascular;  Laterality: N/A;   CAROTID ENDARTERECTOMY  01/05/2006   Right  CEA with DPA   CATARACT EXTRACTION W/ INTRAOCULAR LENS IMPLANT Left 12/04/2017   CATARACT EXTRACTION W/PHACO Left 12/04/2017   Procedure: CATARACT EXTRACTION PHACO AND INTRAOCULAR LENS PLACEMENT ([place]) LEFT;  Surgeon: Eulogio Bear, MD;  Location: [place];  Service: Ophthalmology;  Laterality: Left;   CATARACT EXTRACTION W/PHACO Right 02/06/2018   Procedure: CATARACT EXTRACTION PHACO AND INTRAOCULAR LENS PLACEMENT (IOC)RIGHT;  Surgeon: Eulogio Bear, MD;  Location: [place];  Service: Ophthalmology;  Laterality: Right;   COLONOSCOPY  05/20/2008   COLONOSCOPY WITH PROPOFOL N/A 09/13/2018   Procedure: COLONOSCOPY WITH BIOPSY;  Surgeon: [name], MD;  Location: [place];  Service: Endoscopy;  Laterality: N/A;   CORONARY ARTERY BYPASS GRAFT N/A 11/11/2019   Procedure: CORONARY ARTERY BYPASS GRAFTING (CABG) USING LIMA to Diag1; ENDOSCOPICALLY HARVESTED RIGHT GREATER SAPHENOUS VEIN: SVG to OM1; SVG to OM2; SVG to PDA.;  Surgeon: Ivin Poot, MD;  Location: [place];  Service: Open Heart Surgery;  Laterality: N/A;   CORONARY STENT PLACEMENT  08/07/2015   MID CIRCUMFLEX   ENDARTERECTOMY FEMORAL Bilateral 09/30/2020   Procedure: ENDARTERECTOMY FEMORAL;  Surgeon: Algernon Huxley, MD;  Location: [facility];  Service: Vascular;  Laterality: Bilateral;   ENDOVEIN HARVEST OF GREATER SAPHENOUS VEIN Right 11/11/2019   Procedure: ENDOVEIN HARVEST OF GREATER SAPHENOUS VEIN;  Surgeon: Ivin Poot, MD;   Location: [place];  Service: Open Heart Surgery;  Laterality: Right;   ESOPHAGOGASTRODUODENOSCOPY (EGD) WITH PROPOFOL N/A 02/11/2019   Procedure: ESOPHAGOGASTRODUODENOSCOPY (EGD) WITH BIOPSY and  Dilation;  Surgeon: [name], MD;  Location: [place];  Service: Endoscopy;  Laterality: N/A;   ESOPHAGOGASTRODUODENOSCOPY (EGD) WITH PROPOFOL N/A 12/23/2021   Procedure: ESOPHAGOGASTRODUODENOSCOPY (EGD) WITH PROPOFOL;  Surgeon: [name], MD;  Location: [facility];  Service: Endoscopy;  Laterality: N/A;   HIP SURGERY Left 10/2016   left hip tendon repair   LEFT HEART CATH AND CORONARY ANGIOGRAPHY N/A 11/27/2017   Procedure: LEFT HEART CATH AND CORONARY ANGIOGRAPHY;  Surgeon: Wellington Hampshire, MD;  Location: [place] CV [facility];  Service: Cardiovascular;  Laterality: N/A;   LOWER EXTREMITY ANGIOGRAPHY Left 02/12/2018   Procedure: LOWER EXTREMITY ANGIOGRAPHY;  Surgeon: Algernon Huxley, MD;  Location: [place] CV LAB;  Service: Cardiovascular;  Laterality: Left;   LOWER EXTREMITY ANGIOGRAPHY Left 03/07/2018   Procedure: LOWER EXTREMITY ANGIOGRAPHY;  Surgeon: Algernon Huxley, MD;  Location: [place] CV [facility];  Service: Cardiovascular;  Laterality: Left;   LOWER EXTREMITY ANGIOGRAPHY Left 06/04/2018   Procedure: LOWER EXTREMITY ANGIOGRAPHY;  Surgeon: Algernon Huxley, MD;  Location: [place] CV [facility];  Service: Cardiovascular;  Laterality: Left;   LOWER EXTREMITY ANGIOGRAPHY Left 09/17/2020   Procedure: LOWER EXTREMITY ANGIOGRAPHY;  Surgeon: Algernon Huxley, MD;  Location: [place] [facility];  Service: Cardiovascular;  Laterality: Left;   LOWER EXTREMITY ANGIOGRAPHY Left 09/28/2020   Procedure: LOWER EXTREMITY ANGIOGRAPHY;  Surgeon: Algernon Huxley, MD;  Location: [place] CV [facility];  Service: Cardiovascular;  Laterality: Left;   LOWER EXTREMITY ANGIOGRAPHY N/A 02/15/2021   Procedure: LOWER EXTREMITY ANGIOGRAPHY;  Surgeon: Algernon Huxley, MD;  Location: [place] [facility];  Service:  Cardiovascular;  Laterality: N/A;   PLACEMENT OF IMPELLA LEFT VENTRICULAR ASSIST DEVICE N/A 11/11/2019   Procedure: PLACEMENT OF IMPELLA LEFT VENTRICULAR ASSIST DEVICE 5.5;  Surgeon: Ivin Poot, MD;  Location: [place];  Service: Open Heart Surgery;  Laterality: N/A;  Midline Sternotomy   POLYPECTOMY N/A 09/13/2018  Procedure: POLYPECTOMY;  Surgeon: [name], MD;  Location: [place];  Service: Endoscopy;  Laterality: N/A;   POLYPECTOMY N/A 02/11/2019   Procedure: POLYPECTOMY;  Surgeon: [name], MD;  Location: [place];  Service: Endoscopy;  Laterality: N/A;   REMOVAL OF IMPELLA LEFT VENTRICULAR ASSIST DEVICE N/A 11/15/2019   Procedure: REMOVAL OF IMPELLA 5.5 LEFT VENTRICULAR ASSIST DEVICE;  Surgeon: Ivin Poot, MD;  Location: [place];  Service: Open Heart Surgery;  Laterality: N/A;   RIGHT/LEFT HEART CATH AND CORONARY ANGIOGRAPHY N/A 11/05/2019   Procedure: RIGHT/LEFT HEART CATH AND CORONARY ANGIOGRAPHY;  Surgeon: Nelva Bush, MD;  Location: [place] [facility];  Service: Cardiovascular;  Laterality: N/A;   SPINE SURGERY     TEE WITHOUT CARDIOVERSION N/A 11/11/2019   Procedure: TRANSESOPHAGEAL ECHOCARDIOGRAM (TEE);  Surgeon: Prescott Gum, Collier Salina, MD;  Location: [place];  Service: Open Heart Surgery;  Laterality: N/A;   TEE WITHOUT CARDIOVERSION N/A 11/15/2019   Procedure: TRANSESOPHAGEAL ECHOCARDIOGRAM (TEE);  Surgeon: Prescott Gum, Collier Salina, MD;  Location: [place];  Service: Open Heart Surgery;  Laterality: N/A;   TONSILLECTOMY      Family History  Problem Relation Age of Onset   Heart attack Mother    Coronary artery disease Mother    Heart disease Mother        Carotid Stenosis and BPG and Heart Disease before age 81   Diabetes Mother    Hypertension Mother    Heart attack Father    Heart disease Father        BPG and Heart Disease before age 58   Hypertension Father    Cancer Father 43       throat   Stroke Father    Colon cancer Neg Hx    Colon polyps  Neg Hx    Esophageal cancer Neg Hx    Rectal cancer Neg Hx    Stomach cancer Neg Hx     Allergies  Allergen Reactions   Brilinta [Ticagrelor] Shortness Of Breath   Chlorhexidine Gluconate Other (See Comments)    Skin burning for hours afterward   Contrast Media [Iodinated Contrast Media] Itching    Face and head flushing, nose itching after contrast administation for angiogram   Statins Other (See Comments)    Failed Crestor 5 mg twice weekly, Crestor 20 mg daily, Pravastatin 40 mg qd, Lipitor, Zocor - muscle aches   Zetia [Ezetimibe] Other (See Comments)    Muscle aches       Latest Ref Rng & Units 02/16/2021    6:14 AM 02/15/2021   10:04 AM 10/07/2020   10:30 AM  CBC  WBC 4.0 - 10.5 K/uL 7.4  4.1  5.1   Hemoglobin 13.0 - 17.0 g/dL 13.3  14.3  11.5   Hematocrit 39.0 - 52.0 % 40.3  44.6  34.4   Platelets 150 - 400 K/uL 167  153  218       CMP     Component Value Date/Time   NA 138 12/06/2021 1032   NA 144 11/18/2020 1011   K 4.4 12/06/2021 1032   CL 107 12/06/2021 1032   CO2 25 12/06/2021 1032   GLUCOSE 118 (H) 12/06/2021 1032   BUN 24 (H) 12/06/2021 1032   BUN 19 11/18/2020 1011   CREATININE 1.21 12/06/2021 1032   CREATININE 1.12 07/23/2015 1016   CALCIUM 9.1 12/06/2021 1032   PROT 7.2 12/06/2021 1032   PROT 7.3 07/24/2020 0856   ALBUMIN 3.9 12/06/2021 1032  ALBUMIN 4.5 07/24/2020 0856   AST 43 (H) 12/06/2021 1032   ALT 50 (H) 12/06/2021 1032   ALKPHOS 96 12/06/2021 1032   BILITOT 0.8 12/06/2021 1032   BILITOT 0.4 07/24/2020 0856   GFRNONAA >60 12/06/2021 1032   GFRAA 85 04/22/2020 1013     No results found.     Assessment & Plan:   1. Arterial occlusion Recommend:  The patient has experienced increased claudication symptoms and is now describing lifestyle limiting claudication and appears to be having mild rest pain symptroms.  Given the severity of the patient's severe left lower extremity symptoms the patient should undergo angiography with  the hope for intervention.  Risk and benefits were reviewed the patient.  Indications for the procedure were reviewed.  All questions were answered, the patient agrees to proceed with left lower extremity angiography and possible intervention.   The patient should continue walking and begin a more formal exercise program.  The patient should continue antiplatelet therapy and aggressive treatment of the lipid abnormalities  The patient will follow up with me after the angiogram.    2. Essential hypertension Continue antihypertensive medications as already ordered, these medications have been reviewed and there are no changes at this time.   3. Hyperlipidemia LDL goal <70 Continue statin as ordered and reviewed, no changes at this time    Current Outpatient Medications on File Prior to Visit  Medication Sig Dispense Refill   aspirin EC 81 MG tablet Take 1 tablet (81 mg total) by mouth daily. 150 tablet 2   carvedilol (COREG) 12.5 MG tablet TAKE (1) TABLET BY MOUTH TWICE DAILY 180 tablet 1   colchicine 0.6 MG tablet TAKE 1 TABLET BY MOUTH 2 TIMES DAILY. 30 tablet 0   ELIQUIS 5 MG TABS tablet TAKE 1 TABLET BY MOUTH TWICE A DAY 180 tablet 5   mexiletine (MEXITIL) 150 MG capsule Take 1 capsule (150 mg total) by mouth 2 (two) times daily. 180 capsule 3   nitroGLYCERIN (NITROSTAT) 0.4 MG SL tablet Place 1 tablet (0.4 mg total) under the tongue every 5 (five) minutes as needed for chest pain. 25 tablet 2   PRALUENT 150 MG/ML SOAJ Inject 150 mg into the skin every 14 (fourteen) days.     sacubitril-valsartan (ENTRESTO) 49-51 MG Take 1 tablet by mouth 2 (two) times daily. Need to make an appointment for further refills 180 tablet 3   traZODone (DESYREL) 50 MG tablet Take 1 tablet (50 mg total) by mouth at bedtime as needed. for sleep 90 tablet 1   No current facility-administered medications on file prior to visit.    There are no Patient Instructions on file for this visit. No follow-ups on  file.   Kris Hartmann, NP

## 2021-12-27 NOTE — Op Note (Signed)
[facility]  Percutaneous Study/Intervention Procedural Note   Date of Surgery: 12/27/2021  Surgeon(s):Jerrelle Michelsen    Assistants:none  Pre-operative Diagnosis: PAD with rest pain left lower extremity  Post-operative diagnosis:  Same  Procedure(s) Performed:             1.  Ultrasound guidance for vascular access right femoral artery             2.  Catheter placement into left common femoral artery from right femoral approach             3.  Aortogram and selective left lower extremity angiogram             4.  Mechanical thrombectomy using the penumbra 7 bolt catheter in the left SFA and popliteal arteries             5.  Percutaneous transluminal angioplasty of the left posterior tibial artery and tibioperoneal trunk with 3 mm diameter angioplasty balloon  6.  Percutaneous transluminal angioplasty of the left SFA and popliteal arteries with 5 mm diameter Lutonix drug-coated angioplasty balloon and a 6 mm diameter Lutonix drug-coated angioplasty balloon             7.  Catheter directed thrombolytic therapy with 6 mg of tPA delivered through the catheter in the left SFA, popliteal artery, tibioperoneal trunk, proximal posterior tibial artery and placement of infusion catheter for continuous thrombolytic therapy  8.  Placement of a right femoral venous triple-lumen catheter with ultrasound guidance  EBL: 150 cc  Contrast: 50 cc  Fluoro Time: 11.3 minutes  Moderate Conscious Sedation Time: approximately 70 minutes using 3 mg of Versed and 75 mcg of Fentanyl              Indications:  Patient is a 71 y.o.male with rest pain and acute on chronic ischemia of the left lower extremity. The patient has noninvasive study showing occlusion of his previous SFA interventions. The patient is brought in for angiography for further evaluation and potential treatment.  Due to the limb threatening nature of the situation, angiogram was performed for attempted limb salvage. The  patient is aware that if the procedure fails, amputation would be expected.  The patient also understands that even with successful revascularization, amputation may still be required due to the severity of the situation.  Risks and benefits are discussed and informed consent is obtained.   Procedure:  The patient was identified and appropriate procedural time out was performed.  The patient was then placed supine on the table and prepped and draped in the usual sterile fashion. Moderate conscious sedation was administered during a face to face encounter with the patient throughout the procedure with my supervision of the RN administering medicines and monitoring the patient's vital signs, pulse oximetry, telemetry and mental status throughout from the start of the procedure until the patient was taken to the recovery room. Ultrasound was used to evaluate the right common femoral artery.  It was patent .  A digital ultrasound image was acquired.  A Seldinger needle was used to access the right common femoral artery under direct ultrasound guidance and a permanent image was performed.  A 0.035 J wire was advanced without resistance and a 5Fr sheath was placed.  Pigtail catheter was placed into the aorta and an AP aortogram was performed. This demonstrated normal renal arteries and normal aorta and iliac segments without significant stenosis. I then crossed the aortic bifurcation and advanced to the left  femoral head. Selective left lower extremity angiogram was then performed. This demonstrated a fairly normal common femoral artery with the disease profunda femoris artery.  The SFA occluded at just above the previously placed stent in the mid segment.  There is very faint distal reconstitution of only a posterior tibial artery distally that was visualized.  This reconstituted in the proximal segment as it remained occluded down through the tibioperoneal trunk and proximal posterior tibial artery.. It was felt that  it was in the patient's best interest to proceed with intervention after these images to avoid a second procedure and a larger amount of contrast and fluoroscopy based off of the findings from the initial angiogram. The patient was systemically heparinized and a 7 [place] [employer] sheath was then placed over the [employer] wire. I then used a Kumpe catheter and the advantage wire to easily navigate through the left SFA, popliteal, tibioperoneal trunk, and proximal posterior tibial artery occlusion and confirm intraluminal flow in the mid posterior tibial artery.  I then placed a V18 wire.  Several passes were then made with the penumbra 7 bolt catheter perform mechanical thrombectomy in the left SFA and popliteal arteries but this would not cross the distal portion of the stent where there was tight constraint.  I then ballooned the left SFA and popliteal stents with 5 mm diameter by 22 cm length Lutonix drug-coated angioplasty balloon throughout and then a 6 mm diameter Lutonix drug-coated angioplasty balloon at the proximal edge of the stent and up in the mid SFA.  These were both inflated to about 10 atm for a minute.  The posterior tibial artery and tibioperoneal trunk were treated with a 3 mm diameter by 15 cm length angioplasty balloon inflated to 10 atm for 1 minute.  Completion imaging showed residual thrombus and stenosis within the stents in the mid and distal portion and just below the stent in the tibioperoneal trunk and I felt our best option for limb salvage would be a continuous infusion of thrombolytic therapy.  I then placed a 135 cm total length 30 cm working length catheter from the mid SFA down through the popliteal artery and tibioperoneal trunk and parked the distal aspect in the proximal posterior tibial artery.  6 mg of tPA were instilled through this and it was secured to the skin as was the sheath for continuous thrombolytic therapy.  I then placed a central line.  Using ultrasound  guidance, the right femoral vein was visualized and found to be patent.  It was then accessed under direct ultrasound guidance without difficulty with a Seldinger needle and a permanent image was recorded.  A J-wire was placed.  After skin nick and dilatation a triple-lumen catheter was placed over the wire and the wire was removed.  All 3 lm withdrew dark red nonpulsatile blood and flushed easily with sterile saline.  It was then secured to the skin with 2 Prolene sutures. I elected to terminate the procedure. The patient was taken to the recovery room in stable condition having tolerated the procedure well.  Findings:               Aortogram:  This demonstrated normal renal arteries and normal aorta and iliac segments without significant stenosis             Left Lower Extremity:  This demonstrated a fairly normal common femoral artery with the disease profunda femoris artery.  The SFA occluded at just above the previously placed stent in the  mid segment.  There is very faint distal reconstitution of only a posterior tibial artery distally that was visualized.  This reconstituted in the proximal segment as it remained occluded down through the tibioperoneal trunk and proximal posterior tibial artery.   Disposition: Patient was taken to the recovery room in stable condition having tolerated the procedure well.  Complications: None  [name] 12/27/2021 6:23 PM   This note was created with Dragon Medical transcription system. Any errors in dictation are purely unintentional.

## 2021-12-27 NOTE — Interval H&P Note (Signed)
History and Physical Interval Note:  12/27/2021 1:41 PM  [name]  has presented today for surgery, with the diagnosis of LLE Angio   BARD   ASO w claudication.  The various methods of treatment have been discussed with the patient and family. After consideration of risks, benefits and other options for treatment, the patient has consented to  Procedure(s): Lower Extremity Angiography (Left) as a surgical intervention.  The patient's history has been reviewed, patient examined, no change in status, stable for surgery.  I have reviewed the patient's chart and labs.  Questions were answered to the patient's satisfaction.     [name]

## 2021-12-27 NOTE — Progress Notes (Signed)
ANTICOAGULATION CONSULT NOTE - Initial Consult  Pharmacy Consult for heparin drip  Indication:  VTE- catheter directed thrombolysis (during time alteplase is infusing)  Allergies  Allergen Reactions   Brilinta [Ticagrelor] Shortness Of Breath   Chlorhexidine Gluconate Other (See Comments)    Skin burning for hours afterward   Contrast Media [Iodinated Contrast Media] Itching    Face and head flushing, nose itching after contrast administation for angiogram   Statins Other (See Comments)    Failed Crestor 5 mg twice weekly, Crestor 20 mg daily, Pravastatin 40 mg qd, Lipitor, Zocor - muscle aches   Zetia [Ezetimibe] Other (See Comments)    Muscle aches    Patient Measurements: Height: '5\' 6"'$  (167.6 cm) Weight: 79.4 kg (175 lb) IBW/kg (Calculated) : 63.8 Heparin Dosing Weight:    Vital Signs: Temp: 98.3 F (36.8 C) (10/30 1329) Temp Source: Oral (10/30 1329) BP: 129/69 (10/30 1900) Pulse Rate: 74 (10/30 1900)  Labs: Recent Labs    12/27/21 1341  CREATININE 0.99    Estimated Creatinine Clearance: 67.8 mL/min (by C-G formula based on SCr of 0.99 mg/dL).   Medical History: Past Medical History:  Diagnosis Date   Abnormal nuclear cardiac imaging test 08/08/2015   Arthritis    fingers   Carotid artery occlusion    CHF (congestive heart failure) (HCC)    Coronary atherosclerosis of native coronary artery 01/29/2013   11/05/19 R/LHC 80% dLMCA stenosis small diffusely dz dLAD, chronically occluded OM1 50%mRCA lesion, widely patent mLCx strent, moderately elevated L heart filling pressures, mild to moderate RH filling pressures, normal to moderately reduced CO   Duodenal erosion    Encounter for screening for lung cancer 07/13/2016   Esophageal stenosis    esophageal dilation   GERD (gastroesophageal reflux disease)    H. pylori infection    Heart attack ([place]) Oct. 2009   Mild   Hiatal hernia    Hyperlipidemia    Hypertension    Old myocardial infarction 11/29/2007    Mildly elevated troponin, isolated value in October 2009. Cardiac catheterization-nonobstructive 60% RCA disease-subsequent nuclear stress test-9 minutes, low risk, mild inferior wall hypokinesis    Pain in limb 12/19/2017   Peripheral vascular disease ([place])    Unstable angina ([place]) 11/25/2017    Medications:  Medications Prior to Admission  Medication Sig Dispense Refill Last Dose   carvedilol (COREG) 12.5 MG tablet TAKE (1) TABLET BY MOUTH TWICE DAILY 180 tablet 1 12/27/2021   colchicine 0.6 MG tablet TAKE 1 TABLET BY MOUTH 2 TIMES DAILY. 30 tablet 0 Past Month   mexiletine (MEXITIL) 150 MG capsule Take 1 capsule (150 mg total) by mouth 2 (two) times daily. 180 capsule 3 12/27/2021   PRALUENT 150 MG/ML SOAJ Inject 150 mg into the skin every 14 (fourteen) days.   Past Month   sacubitril-valsartan (ENTRESTO) 49-51 MG Take 1 tablet by mouth 2 (two) times daily. Need to make an appointment for further refills 180 tablet 3 12/27/2021   traZODone (DESYREL) 50 MG tablet Take 1 tablet (50 mg total) by mouth at bedtime as needed. for sleep 90 tablet 1 12/26/2021   aspirin EC 81 MG tablet Take 1 tablet (81 mg total) by mouth daily. 150 tablet 2 12/25/2021   ELIQUIS 5 MG TABS tablet TAKE 1 TABLET BY MOUTH TWICE A DAY 180 tablet 5 12/24/2021   nitroGLYCERIN (NITROSTAT) 0.4 MG SL tablet Place 1 tablet (0.4 mg total) under the tongue every 5 (five) minutes as needed for chest pain. 25  tablet 2    Scheduled:   Alirocumab  150 mg Subcutaneous Q14 Days   aspirin EC  81 mg Oral Daily   [START ON 12/28/2021] carvedilol  12.5 mg Oral BID WC   fentaNYL       heparin sodium (porcine)       mexiletine  150 mg Oral BID   midazolam       sacubitril-valsartan  1 tablet Oral BID   sodium chloride flush  3 mL Intravenous Q12H   sterile water (preservative free)       Infusions:   sodium chloride     alteplase (LIMB ISCHEMIA) 10 mg in normal saline (0.02 mg/mL) infusion 1 mg/hr (12/27/21 1911)   heparin 800  Units/hr (12/27/21 1837)    Assessment: 71 yo male to start Heparin therapy- with Catheter directed thrombolysis (during time alteplase is infusing). Found to have  occlusion of his previous SFA intervention.  Anticoagulation PTA: apixaban (last dose per Med Rec on 12/24/21) Hgb 15.0  plt 138   S/p mechanical thrombectomy 10/30 with continuous thrombolytic infusion (alteplase)thru catheter in L SFA Heparin drip started by vascular MD at 800 units/hr Goal HL=0.2-0.5    Goal of Therapy:  Heparin level 0.3-0.7 units/ml aPTT 56-85 Monitor platelets by anticoagulation protocol: Yes    Plan:  Heparin drip started at 800 units/hr per vascular MD (see consult note:  HL goal 0.2-0.5). Alteplase drip running concurrently HL ordered Q6h Fibrinogen ordered q6h (notify MD if <150) F/u HL  CBC daily  Gissell Barra A 12/27/2021,7:27 PM

## 2021-12-27 NOTE — Progress Notes (Signed)
Subjective:    Patient ID: [name], male    DOB: March 18, 1950, 71 y.o.   MRN: [id number] No chief complaint on file.   [name] is a 71 year old male who presents today for follow-up evaluation of his peripheral arterial disease.  He notes that over the last several weeks he notes his claudication distance has gotten much more severe and recently he has been having numbness and tingling in his left lower extremity.  The symptoms are consistent with previous arterial occlusions that he has had.  He denies any rest pain currently.  He does note that less than 50 feet he begins to have significant claudication-like symptoms.  The patient does not smoke but he does continue to use snuff occasionally.  Today noninvasive studies show an occlusion of the left distal SFA and distal popliteal.  The patient is currently restored in the tibial vessels by blunted collaterals.    Review of Systems  Cardiovascular:        Claudication  Skin:  Negative for wound.  All other systems reviewed and are negative.      Objective:   Physical Exam Vitals reviewed.  HENT:     Head: Normocephalic.  Cardiovascular:     Rate and Rhythm: Normal rate.  Pulmonary:     Effort: Pulmonary effort is normal.  Skin:    General: Skin is warm and dry.  Neurological:     Mental Status: He is alert and oriented to person, place, and time.  Psychiatric:        Mood and Affect: Mood normal.        Behavior: Behavior normal.        Thought Content: Thought content normal.        Judgment: Judgment normal.     BP (!) 133/90 (BP Location: Right Arm)   Pulse 83   Resp 17   Ht '5\' 6"'$  (1.676 m)   Wt 181 lb 9.6 oz (82.4 kg)   BMI 29.31 kg/m   Past Medical History:  Diagnosis Date   Abnormal nuclear cardiac imaging test 08/08/2015   Arthritis    fingers   Carotid artery occlusion    CHF (congestive heart failure) (HCC)    Coronary atherosclerosis of native coronary artery 01/29/2013   11/05/19 R/LHC 80%  dLMCA stenosis small diffusely dz dLAD, chronically occluded OM1 50%mRCA lesion, widely patent mLCx strent, moderately elevated L heart filling pressures, mild to moderate RH filling pressures, normal to moderately reduced CO   Duodenal erosion    Encounter for screening for lung cancer 07/13/2016   Esophageal stenosis    esophageal dilation   GERD (gastroesophageal reflux disease)    H. pylori infection    Heart attack ([place]) Oct. 2009   Mild   Hiatal hernia    Hyperlipidemia    Hypertension    Old myocardial infarction 11/29/2007   Mildly elevated troponin, isolated value in October 2009. Cardiac catheterization-nonobstructive 60% RCA disease-subsequent nuclear stress test-9 minutes, low risk, mild inferior wall hypokinesis    Pain in limb 12/19/2017   Peripheral vascular disease ([place])    Unstable angina ([place]) 11/25/2017    Social History   Socioeconomic History   Marital status: Married    Spouse name: Not on file   Number of children: 1   Years of education: Not on file   Highest education level: Bachelor's degree (e.g., BA, AB, BS)  Occupational History   Not on file  Tobacco Use   Smoking status:  Former    Packs/day: 1.25    Years: 35.00    Total pack years: 43.75    Types: Cigarettes    Quit date: 02/28/2005    Years since quitting: 16.8   Smokeless tobacco: Current    Types: Snuff   Tobacco comments:    occaisionally  Vaping Use   Vaping Use: Never used  Substance and Sexual Activity   Alcohol use: Yes    Alcohol/week: 8.0 - 10.0 standard drinks of alcohol    Types: 8 - 10 Glasses of wine per week    Comment: weekly   Drug use: No   Sexual activity: Yes  Other Topics Concern   Not on file  Social History Narrative   Not on file   Social Determinants of Health   Financial Resource Strain: Low Risk  (09/23/2020)   Overall Financial Resource Strain (CARDIA)    Difficulty of Paying Living Expenses: Not hard at all  Food Insecurity: No Food Insecurity  (09/27/2021)   Hunger Vital Sign    Worried About Running Out of Food in the Last Year: Never true    Ran Out of Food in the Last Year: Never true  Transportation Needs: No Transportation Needs (09/27/2021)   PRAPARE - Hydrologist (Medical): No    Lack of Transportation (Non-Medical): No  Recent Concern: Transportation Needs - Unmet Transportation Needs (09/27/2021)   PRAPARE - Transportation    Lack of Transportation (Medical): Yes    Lack of Transportation (Non-Medical): Yes  Physical Activity: Insufficiently Active (09/23/2020)   Exercise Vital Sign    Days of Exercise per Week: 2 days    Minutes of Exercise per Session: 60 min  Stress: No Stress Concern Present (09/23/2020)   [place]    Feeling of Stress : Not at all  Social Connections: Moderately Integrated (09/23/2020)   Social Connection and Isolation Panel [NHANES]    Frequency of Communication with Friends and Family: More than three times a week    Frequency of Social Gatherings with Friends and Family: Three times a week    Attends Religious Services: Never    Active Member of Clubs or Organizations: Yes    Attends Archivist Meetings: More than 4 times per year    Marital Status: Married  Human resources officer Violence: Not At Risk (09/23/2020)   Humiliation, Afraid, Rape, and Kick questionnaire    Fear of Current or Ex-Partner: No    Emotionally Abused: No    Physically Abused: No    Sexually Abused: No    Past Surgical History:  Procedure Laterality Date   APPENDECTOMY     BACK SURGERY     BIV ICD INSERTION CRT-D N/A 03/23/2020   Procedure: BIV ICD INSERTION CRT-D;  Surgeon: Vickie Epley, MD;  Location: [place] [facility];  Service: Cardiovascular;  Laterality: N/A;   CARDIAC CATHETERIZATION N/A 08/07/2015   Procedure: Left Heart Cath and Coronary Angiography;  Surgeon: [name], MD;  Location: [place]  [facility];  Service: Cardiovascular;  Laterality: N/A;   CARDIAC CATHETERIZATION N/A 08/07/2015   Procedure: Coronary Stent Intervention;  Surgeon: [name], MD;  Location: [place] [facility];  Service: Cardiovascular;  Laterality: N/A;   CARDIAC CATHETERIZATION N/A 08/07/2015   Procedure: Coronary Stent Intervention;  Surgeon: [name], MD;  Location: [place] [facility];  Service: Cardiovascular;  Laterality: N/A;   CAROTID ENDARTERECTOMY  01/05/2006   Right  CEA with DPA   CATARACT EXTRACTION W/ INTRAOCULAR LENS IMPLANT Left 12/04/2017   CATARACT EXTRACTION W/PHACO Left 12/04/2017   Procedure: CATARACT EXTRACTION PHACO AND INTRAOCULAR LENS PLACEMENT ([place]) LEFT;  Surgeon: Eulogio Bear, MD;  Location: [place];  Service: Ophthalmology;  Laterality: Left;   CATARACT EXTRACTION W/PHACO Right 02/06/2018   Procedure: CATARACT EXTRACTION PHACO AND INTRAOCULAR LENS PLACEMENT (IOC)RIGHT;  Surgeon: Eulogio Bear, MD;  Location: [place];  Service: Ophthalmology;  Laterality: Right;   COLONOSCOPY  05/20/2008   COLONOSCOPY WITH PROPOFOL N/A 09/13/2018   Procedure: COLONOSCOPY WITH BIOPSY;  Surgeon: [name], MD;  Location: [facility];  Service: Endoscopy;  Laterality: N/A;   CORONARY ARTERY BYPASS GRAFT N/A 11/11/2019   Procedure: CORONARY ARTERY BYPASS GRAFTING (CABG) USING LIMA to Diag1; ENDOSCOPICALLY HARVESTED RIGHT GREATER SAPHENOUS VEIN: SVG to OM1; SVG to OM2; SVG to PDA.;  Surgeon: Ivin Poot, MD;  Location: [place];  Service: Open Heart Surgery;  Laterality: N/A;   CORONARY STENT PLACEMENT  08/07/2015   MID CIRCUMFLEX   ENDARTERECTOMY FEMORAL Bilateral 09/30/2020   Procedure: ENDARTERECTOMY FEMORAL;  Surgeon: Algernon Huxley, MD;  Location: [facility];  Service: Vascular;  Laterality: Bilateral;   ENDOVEIN HARVEST OF GREATER SAPHENOUS VEIN Right 11/11/2019   Procedure: ENDOVEIN HARVEST OF GREATER SAPHENOUS VEIN;  Surgeon: Ivin Poot, MD;   Location: [place];  Service: Open Heart Surgery;  Laterality: Right;   ESOPHAGOGASTRODUODENOSCOPY (EGD) WITH PROPOFOL N/A 02/11/2019   Procedure: ESOPHAGOGASTRODUODENOSCOPY (EGD) WITH BIOPSY and  Dilation;  Surgeon: [name], MD;  Location: [place];  Service: Endoscopy;  Laterality: N/A;   ESOPHAGOGASTRODUODENOSCOPY (EGD) WITH PROPOFOL N/A 12/23/2021   Procedure: ESOPHAGOGASTRODUODENOSCOPY (EGD) WITH PROPOFOL;  Surgeon: [name], MD;  Location: [facility];  Service: Endoscopy;  Laterality: N/A;   HIP SURGERY Left 10/2016   left hip tendon repair   LEFT HEART CATH AND CORONARY ANGIOGRAPHY N/A 11/27/2017   Procedure: LEFT HEART CATH AND CORONARY ANGIOGRAPHY;  Surgeon: Wellington Hampshire, MD;  Location: [place] [facility];  Service: Cardiovascular;  Laterality: N/A;   LOWER EXTREMITY ANGIOGRAPHY Left 02/12/2018   Procedure: LOWER EXTREMITY ANGIOGRAPHY;  Surgeon: Algernon Huxley, MD;  Location: [place] [facility];  Service: Cardiovascular;  Laterality: Left;   LOWER EXTREMITY ANGIOGRAPHY Left 03/07/2018   Procedure: LOWER EXTREMITY ANGIOGRAPHY;  Surgeon: Algernon Huxley, MD;  Location: [place] [facility];  Service: Cardiovascular;  Laterality: Left;   LOWER EXTREMITY ANGIOGRAPHY Left 06/04/2018   Procedure: LOWER EXTREMITY ANGIOGRAPHY;  Surgeon: Algernon Huxley, MD;  Location: [place] [facility];  Service: Cardiovascular;  Laterality: Left;   LOWER EXTREMITY ANGIOGRAPHY Left 09/17/2020   Procedure: LOWER EXTREMITY ANGIOGRAPHY;  Surgeon: Algernon Huxley, MD;  Location: [place] [facility];  Service: Cardiovascular;  Laterality: Left;   LOWER EXTREMITY ANGIOGRAPHY Left 09/28/2020   Procedure: LOWER EXTREMITY ANGIOGRAPHY;  Surgeon: Algernon Huxley, MD;  Location: [place] [facility];  Service: Cardiovascular;  Laterality: Left;   LOWER EXTREMITY ANGIOGRAPHY N/A 02/15/2021   Procedure: LOWER EXTREMITY ANGIOGRAPHY;  Surgeon: Algernon Huxley, MD;  Location: [place] [facility];  Service:  Cardiovascular;  Laterality: N/A;   PLACEMENT OF IMPELLA LEFT VENTRICULAR ASSIST DEVICE N/A 11/11/2019   Procedure: PLACEMENT OF IMPELLA LEFT VENTRICULAR ASSIST DEVICE 5.5;  Surgeon: Ivin Poot, MD;  Location: [place];  Service: Open Heart Surgery;  Laterality: N/A;  Midline Sternotomy   POLYPECTOMY N/A 09/13/2018  Procedure: POLYPECTOMY;  Surgeon: [name], MD;  Location: [place];  Service: Endoscopy;  Laterality: N/A;   POLYPECTOMY N/A 02/11/2019   Procedure: POLYPECTOMY;  Surgeon: [name], MD;  Location: [place];  Service: Endoscopy;  Laterality: N/A;   REMOVAL OF IMPELLA LEFT VENTRICULAR ASSIST DEVICE N/A 11/15/2019   Procedure: REMOVAL OF IMPELLA 5.5 LEFT VENTRICULAR ASSIST DEVICE;  Surgeon: Ivin Poot, MD;  Location: [place];  Service: Open Heart Surgery;  Laterality: N/A;   RIGHT/LEFT HEART CATH AND CORONARY ANGIOGRAPHY N/A 11/05/2019   Procedure: RIGHT/LEFT HEART CATH AND CORONARY ANGIOGRAPHY;  Surgeon: Nelva Bush, MD;  Location: [place] [facility];  Service: Cardiovascular;  Laterality: N/A;   SPINE SURGERY     TEE WITHOUT CARDIOVERSION N/A 11/11/2019   Procedure: TRANSESOPHAGEAL ECHOCARDIOGRAM (TEE);  Surgeon: Prescott Gum, Collier Salina, MD;  Location: [place];  Service: Open Heart Surgery;  Laterality: N/A;   TEE WITHOUT CARDIOVERSION N/A 11/15/2019   Procedure: TRANSESOPHAGEAL ECHOCARDIOGRAM (TEE);  Surgeon: Prescott Gum, Collier Salina, MD;  Location: [place];  Service: Open Heart Surgery;  Laterality: N/A;   TONSILLECTOMY      Family History  Problem Relation Age of Onset   Heart attack Mother    Coronary artery disease Mother    Heart disease Mother        Carotid Stenosis and BPG and Heart Disease before age 30   Diabetes Mother    Hypertension Mother    Heart attack Father    Heart disease Father        BPG and Heart Disease before age 55   Hypertension Father    Cancer Father 79       throat   Stroke Father    Colon cancer Neg Hx    Colon polyps  Neg Hx    Esophageal cancer Neg Hx    Rectal cancer Neg Hx    Stomach cancer Neg Hx     Allergies  Allergen Reactions   Brilinta [Ticagrelor] Shortness Of Breath   Chlorhexidine Gluconate Other (See Comments)    Skin burning for hours afterward   Contrast Media [Iodinated Contrast Media] Itching    Face and head flushing, nose itching after contrast administation for angiogram   Statins Other (See Comments)    Failed Crestor 5 mg twice weekly, Crestor 20 mg daily, Pravastatin 40 mg qd, Lipitor, Zocor - muscle aches   Zetia [Ezetimibe] Other (See Comments)    Muscle aches       Latest Ref Rng & Units 02/16/2021    6:14 AM 02/15/2021   10:04 AM 10/07/2020   10:30 AM  CBC  WBC 4.0 - 10.5 K/uL 7.4  4.1  5.1   Hemoglobin 13.0 - 17.0 g/dL 13.3  14.3  11.5   Hematocrit 39.0 - 52.0 % 40.3  44.6  34.4   Platelets 150 - 400 K/uL 167  153  218       CMP     Component Value Date/Time   NA 138 12/06/2021 1032   NA 144 11/18/2020 1011   K 4.4 12/06/2021 1032   CL 107 12/06/2021 1032   CO2 25 12/06/2021 1032   GLUCOSE 118 (H) 12/06/2021 1032   BUN 24 (H) 12/06/2021 1032   BUN 19 11/18/2020 1011   CREATININE 1.21 12/06/2021 1032   CREATININE 1.12 07/23/2015 1016   CALCIUM 9.1 12/06/2021 1032   PROT 7.2 12/06/2021 1032   PROT 7.3 07/24/2020 0856   ALBUMIN 3.9 12/06/2021 1032  ALBUMIN 4.5 07/24/2020 0856   AST 43 (H) 12/06/2021 1032   ALT 50 (H) 12/06/2021 1032   ALKPHOS 96 12/06/2021 1032   BILITOT 0.8 12/06/2021 1032   BILITOT 0.4 07/24/2020 0856   GFRNONAA >60 12/06/2021 1032   GFRAA 85 04/22/2020 1013     No results found.     Assessment & Plan:   1. Arterial occlusion Recommend:  The patient has experienced increased claudication symptoms and is now describing lifestyle limiting claudication and appears to be having mild rest pain symptroms.  Given the severity of the patient's severe left lower extremity symptoms the patient should undergo angiography with  the hope for intervention.  Risk and benefits were reviewed the patient.  Indications for the procedure were reviewed.  All questions were answered, the patient agrees to proceed with left lower extremity angiography and possible intervention.   The patient should continue walking and begin a more formal exercise program.  The patient should continue antiplatelet therapy and aggressive treatment of the lipid abnormalities  The patient will follow up with me after the angiogram.    2. Essential hypertension Continue antihypertensive medications as already ordered, these medications have been reviewed and there are no changes at this time.   3. Hyperlipidemia LDL goal <70 Continue statin as ordered and reviewed, no changes at this time    Current Outpatient Medications on File Prior to Visit  Medication Sig Dispense Refill   aspirin EC 81 MG tablet Take 1 tablet (81 mg total) by mouth daily. 150 tablet 2   carvedilol (COREG) 12.5 MG tablet TAKE (1) TABLET BY MOUTH TWICE DAILY 180 tablet 1   colchicine 0.6 MG tablet TAKE 1 TABLET BY MOUTH 2 TIMES DAILY. 30 tablet 0   ELIQUIS 5 MG TABS tablet TAKE 1 TABLET BY MOUTH TWICE A DAY 180 tablet 5   mexiletine (MEXITIL) 150 MG capsule Take 1 capsule (150 mg total) by mouth 2 (two) times daily. 180 capsule 3   nitroGLYCERIN (NITROSTAT) 0.4 MG SL tablet Place 1 tablet (0.4 mg total) under the tongue every 5 (five) minutes as needed for chest pain. 25 tablet 2   PRALUENT 150 MG/ML SOAJ Inject 150 mg into the skin every 14 (fourteen) days.     sacubitril-valsartan (ENTRESTO) 49-51 MG Take 1 tablet by mouth 2 (two) times daily. Need to make an appointment for further refills 180 tablet 3   traZODone (DESYREL) 50 MG tablet Take 1 tablet (50 mg total) by mouth at bedtime as needed. for sleep 90 tablet 1   No current facility-administered medications on file prior to visit.    There are no Patient Instructions on file for this visit. No follow-ups on  file.   Kris Hartmann, NP

## 2021-12-28 ENCOUNTER — Encounter: Payer: Self-pay | Admitting: Vascular Surgery

## 2021-12-28 ENCOUNTER — Encounter
Admission: AD | Disposition: A | Discharge status: Home / Self Care | Payer: Self-pay | Source: Home / Self Care | Attending: Vascular Surgery

## 2021-12-28 DIAGNOSIS — Z9889 Other specified postprocedural states: Secondary | ICD-10-CM | POA: Diagnosis not present

## 2021-12-28 DIAGNOSIS — T82856A Stenosis of peripheral vascular stent, initial encounter: Secondary | ICD-10-CM | POA: Diagnosis not present

## 2021-12-28 DIAGNOSIS — I70222 Atherosclerosis of native arteries of extremities with rest pain, left leg: Secondary | ICD-10-CM | POA: Diagnosis not present

## 2021-12-28 DIAGNOSIS — T82868A Thrombosis of vascular prosthetic devices, implants and grafts, initial encounter: Secondary | ICD-10-CM | POA: Diagnosis not present

## 2021-12-28 DIAGNOSIS — I743 Embolism and thrombosis of arteries of the lower extremities: Secondary | ICD-10-CM | POA: Diagnosis not present

## 2021-12-28 HISTORY — PX: LOWER EXTREMITY ANGIOGRAPHY: CATH118251

## 2021-12-28 LAB — CBC
HCT: 44.5 % (ref 39.0–52.0)
HCT: 45 % (ref 39.0–52.0)
Hemoglobin: 14.6 g/dL (ref 13.0–17.0)
Hemoglobin: 14.6 g/dL (ref 13.0–17.0)
MCH: 31.8 pg (ref 26.0–34.0)
MCH: 32.4 pg (ref 26.0–34.0)
MCHC: 32.4 g/dL (ref 30.0–36.0)
MCHC: 32.8 g/dL (ref 30.0–36.0)
MCV: 98 fL (ref 80.0–100.0)
MCV: 98.9 fL (ref 80.0–100.0)
Platelets: 146 10*3/uL — ABNORMAL LOW (ref 150–400)
Platelets: 152 10*3/uL (ref 150–400)
RBC: 4.5 MIL/uL (ref 4.22–5.81)
RBC: 4.59 MIL/uL (ref 4.22–5.81)
RDW: 13.2 % (ref 11.5–15.5)
RDW: 13.2 % (ref 11.5–15.5)
WBC: 2.4 10*3/uL — ABNORMAL LOW (ref 4.0–10.5)
WBC: 3.4 10*3/uL — ABNORMAL LOW (ref 4.0–10.5)
nRBC: 0 % (ref 0.0–0.2)
nRBC: 0 % (ref 0.0–0.2)

## 2021-12-28 LAB — FIBRINOGEN
Fibrinogen: 228 mg/dL (ref 210–475)
Fibrinogen: 231 mg/dL (ref 210–475)

## 2021-12-28 LAB — GLUCOSE, CAPILLARY: Glucose-Capillary: 196 mg/dL — ABNORMAL HIGH (ref 70–99)

## 2021-12-28 LAB — HEPARIN LEVEL (UNFRACTIONATED)
Heparin Unfractionated: 0.61 IU/mL (ref 0.30–0.70)
Heparin Unfractionated: 0.78 IU/mL — ABNORMAL HIGH (ref 0.30–0.70)

## 2021-12-28 SURGERY — LOWER EXTREMITY ANGIOGRAPHY
Anesthesia: Moderate Sedation | Laterality: Left

## 2021-12-28 MED ORDER — HYDROMORPHONE HCL 1 MG/ML IJ SOLN
1.0000 mg | Freq: Four times a day (QID) | INTRAMUSCULAR | Status: DC
  Filled 2021-12-28: qty 1

## 2021-12-28 MED ORDER — FAMOTIDINE 20 MG PO TABS
ORAL_TABLET | ORAL | Status: AC
  Administered 2021-12-28: 40 mg via ORAL
  Filled 2021-12-28: qty 1

## 2021-12-28 MED ORDER — METHYLPREDNISOLONE SODIUM SUCC 125 MG IJ SOLR: 125.0000 mg | Freq: Once | INTRAMUSCULAR | Status: DC | PRN

## 2021-12-28 MED ORDER — TIROFIBAN HCL IN NACL 5-0.9 MG/100ML-% IV SOLN
0.1500 ug/kg/min | INTRAVENOUS | Status: AC
  Administered 2021-12-28 (×2): 0.075 ug/kg/min via INTRAVENOUS
  Administered 2021-12-28 – 2021-12-29 (×2): 0.15 ug/kg/min via INTRAVENOUS
  Filled 2021-12-28 (×3): qty 100

## 2021-12-28 MED ORDER — FAMOTIDINE 20 MG PO TABS
ORAL_TABLET | ORAL | Status: AC
  Filled 2021-12-28: qty 2

## 2021-12-28 MED ORDER — OXYCODONE-ACETAMINOPHEN 5-325 MG PO TABS
1.0000 | ORAL_TABLET | Freq: Four times a day (QID) | ORAL | Status: DC | PRN
  Administered 2021-12-28 (×2): 1 via ORAL
  Filled 2021-12-28: qty 2
  Filled 2021-12-28: qty 1

## 2021-12-28 MED ORDER — FAMOTIDINE 20 MG PO TABS
ORAL_TABLET | ORAL | Status: AC
  Filled 2021-12-28: qty 1

## 2021-12-28 MED ORDER — CEFAZOLIN SODIUM-DEXTROSE 2-4 GM/100ML-% IV SOLN: 2.0000 g | INTRAVENOUS | Status: DC

## 2021-12-28 MED ORDER — TIROFIBAN (AGGRASTAT) BOLUS VIA INFUSION
25.0000 ug/kg | Freq: Once | INTRAVENOUS | Status: AC
  Administered 2021-12-28: 1985 ug via INTRAVENOUS

## 2021-12-28 MED ORDER — DIPHENHYDRAMINE HCL 50 MG/ML IJ SOLN: 50.0000 mg | Freq: Once | INTRAMUSCULAR | Status: DC | PRN

## 2021-12-28 MED ORDER — SODIUM CHLORIDE 0.9 % IV SOLN: INTRAVENOUS | Status: DC

## 2021-12-28 MED ORDER — NITROGLYCERIN 1 MG/10 ML FOR IR/CATH LAB
INTRA_ARTERIAL | Status: AC
  Filled 2021-12-28: qty 10

## 2021-12-28 MED ORDER — MIDAZOLAM HCL 5 MG/5ML IJ SOLN
INTRAMUSCULAR | Status: AC
  Filled 2021-12-28: qty 5

## 2021-12-28 MED ORDER — FAMOTIDINE 20 MG PO TABS: 40.0000 mg | ORAL_TABLET | Freq: Once | ORAL | Status: DC | PRN

## 2021-12-28 MED ORDER — FENTANYL CITRATE (PF) 100 MCG/2ML IJ SOLN
INTRAMUSCULAR | Status: AC
  Filled 2021-12-28: qty 2

## 2021-12-28 MED ORDER — CEFAZOLIN SODIUM-DEXTROSE 1-4 GM/50ML-% IV SOLN
INTRAVENOUS | Status: DC | PRN
  Administered 2021-12-28: 2 g via INTRAVENOUS

## 2021-12-28 MED ORDER — CEFAZOLIN SODIUM-DEXTROSE 2-4 GM/100ML-% IV SOLN
INTRAVENOUS | Status: AC
  Filled 2021-12-28: qty 100

## 2021-12-28 MED ORDER — ONDANSETRON HCL 4 MG/2ML IJ SOLN: 4.0000 mg | Freq: Four times a day (QID) | INTRAMUSCULAR | Status: DC | PRN

## 2021-12-28 MED ORDER — IODIXANOL 320 MG/ML IV SOLN
INTRAVENOUS | Status: DC | PRN
  Administered 2021-12-28: 40 mL

## 2021-12-28 MED ORDER — HEPARIN SODIUM (PORCINE) 1000 UNIT/ML IJ SOLN
INTRAMUSCULAR | Status: DC | PRN
  Administered 2021-12-28: 3000 [IU] via INTRAVENOUS

## 2021-12-28 MED ORDER — TIROFIBAN HCL IV 12.5 MG/250 ML
INTRAVENOUS | Status: AC
  Filled 2021-12-28: qty 250

## 2021-12-28 MED ORDER — DIPHENHYDRAMINE HCL 50 MG/ML IJ SOLN
INTRAMUSCULAR | Status: AC
  Administered 2021-12-28: 50 mg via INTRAVENOUS
  Filled 2021-12-28: qty 1

## 2021-12-28 MED ORDER — MIDAZOLAM HCL 2 MG/2ML IJ SOLN
INTRAMUSCULAR | Status: DC | PRN
  Administered 2021-12-28: 2 mg via INTRAVENOUS

## 2021-12-28 MED ORDER — MIDAZOLAM HCL 2 MG/ML PO SYRP: 8.0000 mg | ORAL_SOLUTION | Freq: Once | ORAL | Status: DC | PRN

## 2021-12-28 MED ORDER — FENTANYL CITRATE (PF) 100 MCG/2ML IJ SOLN
INTRAMUSCULAR | Status: DC | PRN
  Administered 2021-12-28: 50 ug via INTRAVENOUS

## 2021-12-28 MED ORDER — INFLUENZA VAC A&B SA ADJ QUAD 0.5 ML IM PRSY: 0.5000 mL | PREFILLED_SYRINGE | INTRAMUSCULAR | Status: DC

## 2021-12-28 MED ORDER — HYDROMORPHONE HCL 1 MG/ML IJ SOLN
1.0000 mg | Freq: Four times a day (QID) | INTRAMUSCULAR | Status: DC | PRN
  Administered 2021-12-28 (×2): 1 mg via INTRAVENOUS
  Filled 2021-12-28: qty 1

## 2021-12-28 MED ORDER — HEPARIN SODIUM (PORCINE) 1000 UNIT/ML IJ SOLN
INTRAMUSCULAR | Status: AC
  Filled 2021-12-28: qty 10

## 2021-12-28 MED ORDER — METHYLPREDNISOLONE SODIUM SUCC 125 MG IJ SOLR
INTRAMUSCULAR | Status: AC
  Administered 2021-12-28: 125 mg via INTRAVENOUS
  Filled 2021-12-28: qty 2

## 2021-12-28 MED ORDER — NITROGLYCERIN 1 MG/10 ML FOR IR/CATH LAB
INTRA_ARTERIAL | Status: DC | PRN
  Administered 2021-12-28: 400 ug

## 2021-12-28 SURGICAL SUPPLY — 17 items
BALLN ARMADA 3.0X100X150 (BALLOONS) ×1
BALLN DORADO 6X200X135 (BALLOONS) ×1
BALLN LUTONIX 7X220X130 (BALLOONS) ×1
BALLN ULTRASCORE 5X100X130 (BALLOONS) ×1
BALLOON ARMADA 3.0X100X150 (BALLOONS) IMPLANT
BALLOON DORADO 6X200X135 (BALLOONS) IMPLANT
BALLOON LUTONIX 7X220X130 (BALLOONS) IMPLANT
BALLOON ULTRASCORE 5X100X130 (BALLOONS) IMPLANT
CANISTER PENUMBRA ENGINE (MISCELLANEOUS) IMPLANT
CATH LIGHTNING BOLT 7 130 (CATHETERS) IMPLANT
DEVICE SAFEGUARD 24CM (GAUZE/BANDAGES/DRESSINGS) IMPLANT
DEVICE STARCLOSE SE CLOSURE (Vascular Products) IMPLANT
KIT ENCORE 26 ADVANTAGE (KITS) IMPLANT
PACK ANGIOGRAPHY (CUSTOM PROCEDURE TRAY) ×2 IMPLANT
STENT VIABAHN 7X25X120 (Permanent Stent) IMPLANT
WIRE G V18X300CM (WIRE) IMPLANT
WIRE GUIDERIGHT .035X150 (WIRE) IMPLANT

## 2021-12-28 NOTE — Plan of Care (Signed)

## 2021-12-28 NOTE — Progress Notes (Signed)
Pt A&OX4, receiving prn pain medication for headache. Pt R groin PAD site level 1 slow ooze. No hematoma palpated. Pedal pulses dopplerable bilaterally. R PAD continued to ooze slightly to outside of PAD and MD made aware. Per MD pause aggrastat gtt 1hr and resume at half dose. When aggrastat dose resumed at half rate RN unable to doppler pedal or tibial pulses to LLE. MD made aware and aggrastat gtt increased back to prior rate.

## 2021-12-28 NOTE — Progress Notes (Signed)
1800 patient alert able top make needs known able to get dopplers on left foot deflated and re-inflated PAD on right FEM no bleeding noted old drainage noted  2000 all meds given early patient wanting to go to sleep.

## 2021-12-28 NOTE — Progress Notes (Signed)
ANTICOAGULATION CONSULT NOTE   Pharmacy Consult for heparin drip  Indication:  VTE- catheter directed thrombolysis (during time alteplase is infusing)  Allergies  Allergen Reactions   Brilinta [Ticagrelor] Shortness Of Breath   Chlorhexidine Gluconate Other (See Comments)    Skin burning for hours afterward   Contrast Media [Iodinated Contrast Media] Itching    Face and head flushing, nose itching after contrast administation for angiogram   Statins Other (See Comments)    Failed Crestor 5 mg twice weekly, Crestor 20 mg daily, Pravastatin 40 mg qd, Lipitor, Zocor - muscle aches   Zetia [Ezetimibe] Other (See Comments)    Muscle aches    Patient Measurements: Height: '5\' 6"'$  (167.6 cm) Weight: 79.4 kg (175 lb) IBW/kg (Calculated) : 63.8 Heparin Dosing Weight:    Vital Signs: Temp: 97.5 F (36.4 C) (10/31 0400) Temp Source: Oral (10/31 0400) BP: 100/74 (10/31 0505) Pulse Rate: 74 (10/31 0505)  Labs: Recent Labs    12/27/21 1341 12/27/21 1930 12/27/21 1930 12/27/21 2353 12/28/21 0453  HGB  --  15.0   < > 14.6 14.6  HCT  --  45.8  --  45.0 44.5  PLT  --  138*  --  146* 152  HEPARINUNFRC  --  >1.10*  --  0.78* 0.61  CREATININE 0.99  --   --   --   --    < > = values in this interval not displayed.     Estimated Creatinine Clearance: 67.8 mL/min (by C-G formula based on SCr of 0.99 mg/dL).   Medical History: Past Medical History:  Diagnosis Date   Abnormal nuclear cardiac imaging test 08/08/2015   Arthritis    fingers   Carotid artery occlusion    CHF (congestive heart failure) (HCC)    Coronary atherosclerosis of native coronary artery 01/29/2013   11/05/19 R/LHC 80% dLMCA stenosis small diffusely dz dLAD, chronically occluded OM1 50%mRCA lesion, widely patent mLCx strent, moderately elevated L heart filling pressures, mild to moderate RH filling pressures, normal to moderately reduced CO   Duodenal erosion    Encounter for screening for lung cancer 07/13/2016    Esophageal stenosis    esophageal dilation   GERD (gastroesophageal reflux disease)    H. pylori infection    Heart attack ([place]) Oct. 2009   Mild   Hiatal hernia    Hyperlipidemia    Hypertension    Old myocardial infarction 11/29/2007   Mildly elevated troponin, isolated value in October 2009. Cardiac catheterization-nonobstructive 60% RCA disease-subsequent nuclear stress test-9 minutes, low risk, mild inferior wall hypokinesis    Pain in limb 12/19/2017   Peripheral vascular disease ([place])    Unstable angina ([place]) 11/25/2017    Medications:  Medications Prior to Admission  Medication Sig Dispense Refill Last Dose   carvedilol (COREG) 12.5 MG tablet TAKE (1) TABLET BY MOUTH TWICE DAILY 180 tablet 1 12/27/2021   colchicine 0.6 MG tablet TAKE 1 TABLET BY MOUTH 2 TIMES DAILY. 30 tablet 0 Past Month   mexiletine (MEXITIL) 150 MG capsule Take 1 capsule (150 mg total) by mouth 2 (two) times daily. 180 capsule 3 12/27/2021   PRALUENT 150 MG/ML SOAJ Inject 150 mg into the skin every 14 (fourteen) days.   Past Month   sacubitril-valsartan (ENTRESTO) 49-51 MG Take 1 tablet by mouth 2 (two) times daily. Need to make an appointment for further refills 180 tablet 3 12/27/2021   traZODone (DESYREL) 50 MG tablet Take 1 tablet (50 mg total) by mouth at  bedtime as needed. for sleep 90 tablet 1 12/26/2021   aspirin EC 81 MG tablet Take 1 tablet (81 mg total) by mouth daily. 150 tablet 2 12/25/2021   ELIQUIS 5 MG TABS tablet TAKE 1 TABLET BY MOUTH TWICE A DAY 180 tablet 5 12/24/2021   nitroGLYCERIN (NITROSTAT) 0.4 MG SL tablet Place 1 tablet (0.4 mg total) under the tongue every 5 (five) minutes as needed for chest pain. 25 tablet 2    Scheduled:   Alirocumab  150 mg Subcutaneous Q14 Days   aspirin EC  81 mg Oral Daily   carvedilol  12.5 mg Oral BID WC   [START ON 12/29/2021] influenza vaccine adjuvanted  0.5 mL Intramuscular Tomorrow-1000   mexiletine  150 mg Oral BID   sacubitril-valsartan  1 tablet  Oral BID   sodium chloride flush  3 mL Intravenous Q12H   Infusions:   sodium chloride 10 mL/hr at 12/28/21 0500   alteplase (LIMB ISCHEMIA) 10 mg in normal saline (0.02 mg/mL) infusion 1 mg/hr (12/28/21 0535)   heparin 650 Units/hr (12/28/21 0500)    Assessment: 71 yo male to start Heparin therapy- with Catheter directed thrombolysis (during time alteplase is infusing). Found to have  occlusion of his previous SFA intervention.  Anticoagulation PTA: apixaban (last dose per Med Rec on 12/24/21) Hgb 15.0  plt 138   S/p mechanical thrombectomy 10/30 with continuous thrombolytic infusion (alteplase)thru catheter in L SFA Heparin drip started by vascular MD at 800 units/hr Goal HL=0.2-0.5    Goal of Therapy:  Heparin level 0.3-0.7 units/ml aPTT 56-85 Monitor platelets by anticoagulation protocol: Yes   10/30 2353 HL 0.78, supratherapeutic 10/31 0453 HL 0.61, supratherapeutic  Plan:  Last Heparin drip rate changed at 0153 (~3 hr prior to HL) Will decrease heparin drip slightly to 600 units/hr per vascular MD (see consult note:  HL goal 0.2-0.5). Alteplase drip running concurrently Recheck HL q6h Fibrinogen ordered q6h (notify MD if <150) F/u HL  CBC daily  Renda Rolls, PharmD, [facility] 12/28/2021 5:55 AM

## 2021-12-28 NOTE — Progress Notes (Signed)
[place] for heparin drip  Indication:  VTE- catheter directed thrombolysis (during time alteplase is infusing)  Allergies  Allergen Reactions   Brilinta [Ticagrelor] Shortness Of Breath   Chlorhexidine Gluconate Other (See Comments)    Skin burning for hours afterward   Contrast Media [Iodinated Contrast Media] Itching    Face and head flushing, nose itching after contrast administation for angiogram   Statins Other (See Comments)    Failed Crestor 5 mg twice weekly, Crestor 20 mg daily, Pravastatin 40 mg qd, Lipitor, Zocor - muscle aches   Zetia [Ezetimibe] Other (See Comments)    Muscle aches    Patient Measurements: Height: '5\' 6"'$  (167.6 cm) Weight: 79.4 kg (175 lb) IBW/kg (Calculated) : 63.8 Heparin Dosing Weight:    Vital Signs: Temp: 97.7 F (36.5 C) (10/31 0000) Temp Source: Oral (10/31 0000) BP: 94/68 (10/31 0121) Pulse Rate: 75 (10/31 0121)  Labs: Recent Labs    12/27/21 1341 12/27/21 1930 12/27/21 2353  HGB  --  15.0 14.6  HCT  --  45.8 45.0  PLT  --  138* 146*  HEPARINUNFRC  --  >1.10* 0.78*  CREATININE 0.99  --   --      Estimated Creatinine Clearance: 67.8 mL/min (by C-G formula based on SCr of 0.99 mg/dL).   Medical History: Past Medical History:  Diagnosis Date   Abnormal nuclear cardiac imaging test 08/08/2015   Arthritis    fingers   Carotid artery occlusion    CHF (congestive heart failure) (HCC)    Coronary atherosclerosis of native coronary artery 01/29/2013   11/05/19 R/LHC 80% dLMCA stenosis small diffusely dz dLAD, chronically occluded OM1 50%mRCA lesion, widely patent mLCx strent, moderately elevated L heart filling pressures, mild to moderate RH filling pressures, normal to moderately reduced CO   Duodenal erosion    Encounter for screening for lung cancer 07/13/2016   Esophageal stenosis    esophageal dilation   GERD (gastroesophageal reflux disease)    H. pylori infection    Heart attack  ([place]) Oct. 2009   Mild   Hiatal hernia    Hyperlipidemia    Hypertension    Old myocardial infarction 11/29/2007   Mildly elevated troponin, isolated value in October 2009. Cardiac catheterization-nonobstructive 60% RCA disease-subsequent nuclear stress test-9 minutes, low risk, mild inferior wall hypokinesis    Pain in limb 12/19/2017   Peripheral vascular disease ([place])    Unstable angina ([place]) 11/25/2017    Medications:  Medications Prior to Admission  Medication Sig Dispense Refill Last Dose   carvedilol (COREG) 12.5 MG tablet TAKE (1) TABLET BY MOUTH TWICE DAILY 180 tablet 1 12/27/2021   colchicine 0.6 MG tablet TAKE 1 TABLET BY MOUTH 2 TIMES DAILY. 30 tablet 0 Past Month   mexiletine (MEXITIL) 150 MG capsule Take 1 capsule (150 mg total) by mouth 2 (two) times daily. 180 capsule 3 12/27/2021   PRALUENT 150 MG/ML SOAJ Inject 150 mg into the skin every 14 (fourteen) days.   Past Month   sacubitril-valsartan (ENTRESTO) 49-51 MG Take 1 tablet by mouth 2 (two) times daily. Need to make an appointment for further refills 180 tablet 3 12/27/2021   traZODone (DESYREL) 50 MG tablet Take 1 tablet (50 mg total) by mouth at bedtime as needed. for sleep 90 tablet 1 12/26/2021   aspirin EC 81 MG tablet Take 1 tablet (81 mg total) by mouth daily. 150 tablet 2 12/25/2021   ELIQUIS 5 MG TABS tablet TAKE 1  TABLET BY MOUTH TWICE A DAY 180 tablet 5 12/24/2021   nitroGLYCERIN (NITROSTAT) 0.4 MG SL tablet Place 1 tablet (0.4 mg total) under the tongue every 5 (five) minutes as needed for chest pain. 25 tablet 2    Scheduled:   Alirocumab  150 mg Subcutaneous Q14 Days   aspirin EC  81 mg Oral Daily   carvedilol  12.5 mg Oral BID WC   fentaNYL       heparin sodium (porcine)       mexiletine  150 mg Oral BID   midazolam       sacubitril-valsartan  1 tablet Oral BID   sodium chloride flush  3 mL Intravenous Q12H   sterile water (preservative free)       Infusions:   sodium chloride 10 mL/hr at  12/28/21 0000   alteplase (LIMB ISCHEMIA) 10 mg in normal saline (0.02 mg/mL) infusion 1 mg/hr (12/28/21 0000)   heparin 800 Units/hr (12/28/21 0000)    Assessment: 71 yo male to start Heparin therapy- with Catheter directed thrombolysis (during time alteplase is infusing). Found to have  occlusion of his previous SFA intervention.  Anticoagulation PTA: apixaban (last dose per Med Rec on 12/24/21) Hgb 15.0  plt 138   S/p mechanical thrombectomy 10/30 with continuous thrombolytic infusion (alteplase)thru catheter in L SFA Heparin drip started by vascular MD at 800 units/hr Goal HL=0.2-0.5    Goal of Therapy:  Heparin level 0.3-0.7 units/ml aPTT 56-85 Monitor platelets by anticoagulation protocol: Yes   10/30 2353 HL 0.78, supratherapeutic  Plan:  Decrease heparin drip started at 650 units/hr per vascular MD (see consult note:  HL goal 0.2-0.5). Alteplase drip running concurrently Recheck HL q6h x 4 Fibrinogen ordered q6h (notify MD if <150) F/u HL  CBC daily  Renda Rolls, PharmD, [facility] 12/28/2021 1:27 AM

## 2021-12-28 NOTE — Op Note (Signed)
[facility]  Percutaneous Study/Intervention Procedural Note   Date of Surgery: 12/28/2021  Surgeon(s):,    Assistants:none  Pre-operative Diagnosis: PAD with rest pain left lower extremity status post overnight thrombolytic therapy  Post-operative diagnosis:  Same  Procedure(s) Performed:             1.  Left lower extremity angiogram via the existing sheath and catheters             2.  Mechanical thrombectomy of the left SFA and popliteal arteries with the penumbra CAT 7 bolt device             3.  Percutaneous transluminal angioplasty of the left SFA and popliteal arteries with a 5 mm diameter scoring balloon and 6 mm diameter high-pressure angioplasty balloon             4.  Percutaneous transluminal angioplasty of left tibioperoneal trunk and posterior tibial artery with a 3 mm diameter by 15 cm length angioplasty balloon             5.  Stent placement to the left SFA and popliteal arteries with a 7 mm diameter by 25 cm length Viabahn stent  6.  StarClose closure device right femoral artery  EBL: 50 cc  Contrast: 40 cc  Fluoro Time: 8.1 minutes  Moderate Conscious Sedation Time: approximately 59 minutes using 2 mg of Versed and 50 mcg of Fentanyl              Indications:  Patient is a 71 y.o.male with recurrent rest pain in the left lower extremity.  He has been running overnight thrombolytic therapy for thrombosis of his left SFA and popliteal stents as well as thrombus in the runoff vessels. The patient is brought in for angiography for further evaluation and potential treatment.  Due to the limb threatening nature of the situation, angiogram was performed for attempted limb salvage. The patient is aware that if the procedure fails, amputation would be expected.  The patient also understands that even with successful revascularization, amputation may still be required due to the severity of the situation.  Risks and benefits are discussed and  informed consent is obtained.   Procedure:  The patient was identified and appropriate procedural time out was performed.  The patient was then placed supine on the table and prepped and draped in the usual sterile fashion. Moderate conscious sedation was administered during a face to face encounter with the patient throughout the procedure with my supervision of the RN administering medicines and monitoring the patient's vital signs, pulse oximetry, telemetry and mental status throughout from the start of the procedure until the patient was taken to the recovery room.  The existing thrombolytic catheter that had been sitting from the mid SFA down to the proximal posterior tibial artery was removed after placing a V18 wire.  The sheath remained in place.  Selective left lower extremity angiogram was then performed. This demonstrated continued thrombus in the left SFA and popliteal stents as well as thrombus and stenosis in the tibioperoneal trunk and the proximal portion of the posterior tibial artery distally.  The peroneal artery and anterior tibial artery did have some flow providing additional runoff distally. It was felt that it was in the patient's best interest to proceed with intervention after these images to avoid a second procedure and a larger amount of contrast and fluoroscopy based off of the findings from the initial angiogram. The patient was systemically heparinized. I  then used the penumbra CAT 7 bolt device in the left SFA and popliteal arteries but a high-grade stenosis in the mid popliteal artery would not allow this to traverse past this.  2 passes were made and thrombus was removed and the stents were essentially cleared down to this point.  I then ballooned the popliteal artery with a 5 mm diameter by 10 cm length scoring balloon and inflated to 8 atm for 1 minute and then ballooned the SFA and popliteal arteries with a 6 mm diameter by 20 cm length high-pressure angioplasty balloon  inflated to 28 following this, there remains a high-grade residual stenosis in the mid popliteal artery and thrombus in the below-knee popliteal artery.  I covered this and bridged up to the origin of the previously placed stent in the SFA with a 7 mm diameter by 25 cm length Viabahn stent that was then postdilated with a 7 mm diameter angioplasty balloon.  Completion imaging still showed some constraint in the mid popliteal artery but this was now only about 20 to 25%.  The disease residual in the posterior tibial artery and the tibioperoneal trunk was addressed with a 3 mm diameter by 15 cm length angioplasty balloon inflated to 8 atm for 1 minute.  Completion imaging showed the posterior tibial artery to now patent with less than 20% residual stenosis.  The peroneal artery and the anterior tibial also provided additional runoff distally.  At this point, we had done all we can do to restore patency from an endovascular standpoint and the flow was markedly improved.  I elected to terminate the procedure. The sheath was removed and StarClose closure device was deployed in the right femoral artery with excellent hemostatic result. The patient was taken to the recovery room in stable condition having tolerated the procedure well.  Findings:                           Left Lower Extremity: This demonstrated continued thrombus in the left SFA and popliteal stents as well as thrombus and stenosis in the tibioperoneal trunk and the proximal portion of the posterior tibial artery distally.  The peroneal artery and anterior tibial artery did have some flow providing additional runoff distally.   Disposition: Patient was taken to the recovery room in stable condition having tolerated the procedure well.  Complications: None  [name] 12/28/2021 9:00 AM   This note was created with Dragon Medical transcription system. Any errors in dictation are purely unintentional.

## 2021-12-28 NOTE — Progress Notes (Signed)
2100 patient alert x4 able to make alla needs known understands can not beng right leg 2L [place] in place IVs running as ordered external urinary cath placed on patient

## 2021-12-28 NOTE — Plan of Care (Signed)
  Problem: Education: Goal: Knowledge of General Education information will improve Description: Including pain rating scale, medication(s)/side effects and non-pharmacologic comfort measures Outcome: Not Progressing   Problem: Health Behavior/Discharge Planning: Goal: Ability to manage health-related needs will improve Outcome: Not Progressing   Problem: Clinical Measurements: Goal: Ability to maintain clinical measurements within normal limits will improve Outcome: Not Progressing Goal: Will remain free from infection Outcome: Not Progressing Goal: Diagnostic test results will improve Outcome: Not Progressing Goal: Respiratory complications will improve Outcome: Not Progressing Goal: Cardiovascular complication will be avoided Outcome: Not Progressing   Problem: Activity: Goal: Risk for activity intolerance will decrease Outcome: Not Progressing   Problem: Nutrition: Goal: Adequate nutrition will be maintained Outcome: Not Progressing   Problem: Elimination: Goal: Will not experience complications related to bowel motility Outcome: Not Progressing Goal: Will not experience complications related to urinary retention Outcome: Not Progressing   Problem: Pain Managment: Goal: General experience of comfort will improve Outcome: Not Progressing   Problem: Safety: Goal: Ability to remain free from injury will improve Outcome: Not Progressing   Problem: Skin Integrity: Goal: Risk for impaired skin integrity will decrease Outcome: Not Progressing   

## 2021-12-29 NOTE — Progress Notes (Signed)
Patient provided discharge paperwork and instructions, spouse at bedside. Patient and spouse have no questions. PIV removed and gauze in place. Patient removed from tele monitor. Patient dressed himself and this RN transferred patient to medical mall entrance via wheelchair.

## 2021-12-29 NOTE — TOC Initial Note (Signed)
Transition of Care [facility]) - Initial/Assessment Note    Patient Details  Name: [name] MRN: [id number] Date of Birth: 1950-09-08  Transition of Care [facility]) CM/SW Contact:    Shelbie Hutching, RN Phone Number: 12/29/2021, 1:24 PM  Clinical Narrative:                  Transition of Care [facility]) Screening Note   Patient Details  Name: [name] Date of Birth: [birth year]   Transition of Care [facility]) CM/SW Contact:    Shelbie Hutching, RN Phone Number: 12/29/2021, 1:24 PM    Transition of Care Department [facility]) has reviewed patient and no TOC needs have been identified at this time. We will continue to monitor patient advancement through interdisciplinary progression rounds. If new patient transition needs arise, please place a TOC consult.          Patient Goals and CMS Choice        Expected Discharge Plan and Services                                                Prior Living Arrangements/Services                       Activities of Daily Living Home Assistive Devices/Equipment: None ADL Screening (condition at time of admission) Patient's cognitive ability adequate to safely complete daily activities?: Yes Is the patient deaf or have difficulty hearing?: No Does the patient have difficulty seeing, even when wearing glasses/contacts?: No Does the patient have difficulty concentrating, remembering, or making decisions?: No Patient able to express need for assistance with ADLs?: Yes Does the patient have difficulty dressing or bathing?: No Independently performs ADLs?: Yes (appropriate for developmental age) Does the patient have difficulty walking or climbing stairs?: No Weakness of Legs: None Weakness of Arms/Hands: None  Permission Sought/Granted                  Emotional Assessment              Admission diagnosis:  Ischemic leg [I99.8] Patient Active Problem List   Diagnosis Date Noted   Ischemic leg 12/27/2021    Arterial occlusion    Ischemia of left lower extremity 02/15/2021   Hyperlipidemia LDL goal <70 02/15/2021   CAD (coronary artery disease) 02/15/2021   Chronic diastolic CHF (congestive heart failure) ([place]) 02/15/2021   Alcohol use 02/15/2021   AKI (acute kidney injury) ([place]) 11/20/2020   Gouty arthritis of great toe 10/14/2020   Ischemia of extremity 09/28/2020   Postoperative atrial fibrillation ([place]) 05/21/2020   Drug-induced myopathy 05/05/2020   Acquired thrombophilia ([place]) 01/08/2020   Post-operative state 12/18/2019   Postop check 11/27/2019   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   Accelerating angina ([place]) 11/05/2019   Left bundle branch block 11/05/2019   Shortness of breath    Stricture and stenosis of esophagus    Polyp of colon    Rectal polyp    CAD, multiple vessel 08/13/2018   PAD (peripheral artery disease) ([place]) 08/13/2018   Carotid stenosis 07/17/2018   Atherosclerosis of artery of extremity with rest pain ([place]) 06/04/2018   Chronic HFrEF (heart failure with reduced ejection fraction) ([place]) 03/12/2018   Nonischemic cardiomyopathy ([place]) 12/08/2017   Myalgia due to statin 12/08/2017   Peripheral vascular disease of extremity with  claudication ([place]) 05/01/2017   Foot pain, bilateral 05/01/2017   Degenerative disc disease, 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, [place] - [address] [place] [address] Phone: [phone] Fax: [phone]  [id number]  - [address] [place]: [phone]: [phone]    Social Determinants of Health (SDOH) Interventions    Readmission Risk Interventions     No data to display

## 2021-12-29 NOTE — Progress Notes (Signed)
Central line and PAD removed from right groin. No complications. Patient ambulated twice around unit with no complications. MD made aware.

## 2021-12-29 NOTE — Discharge Summary (Signed)
[facility]    Discharge Summary    Patient ID:  [name] MRN: [id number] DOB/AGE: 07-18-1950 71 y.o.  Admit date: 12/27/2021 Discharge date: 12/29/2021 Date of Surgery: 12/28/2021 Surgeon: Surgeon(s): Algernon Huxley, MD  Admission Diagnosis: Ischemic leg [I99.8]  Discharge Diagnoses:  Ischemic leg [I99.8]  Secondary Diagnoses: Past Medical History:  Diagnosis Date   Abnormal nuclear cardiac imaging test 08/08/2015   Arthritis    fingers   Carotid artery occlusion    CHF (congestive heart failure) (HCC)    Coronary atherosclerosis of native coronary artery 01/29/2013   11/05/19 R/LHC 80% dLMCA stenosis small diffusely dz dLAD, chronically occluded OM1 50%mRCA lesion, widely patent mLCx strent, moderately elevated L heart filling pressures, mild to moderate RH filling pressures, normal to moderately reduced CO   Duodenal erosion    Encounter for screening for lung cancer 07/13/2016   Esophageal stenosis    esophageal dilation   GERD (gastroesophageal reflux disease)    H. pylori infection    Heart attack ([place]) Oct. 2009   Mild   Hiatal hernia    Hyperlipidemia    Hypertension    Old myocardial infarction 11/29/2007   Mildly elevated troponin, isolated value in October 2009. Cardiac catheterization-nonobstructive 60% RCA disease-subsequent nuclear stress test-9 minutes, low risk, mild inferior wall hypokinesis    Pain in limb 12/19/2017   Peripheral vascular disease ([place])    Unstable angina ([place]) 11/25/2017    Procedure(s): Lower Extremity Angiography  Discharged Condition: good  HPI:  [name] is a 71 year old male that presented to [facility] on 12/27/2021 for a left lower extremity angiogram due to limb ischemia.  He underwent overnight lysis therapy followed by a 24 hour Aggrastat drip.  He underwent the following procedure on 12/28/2021, including:   Procedure(s) Performed:             1.  Left lower extremity angiogram via the existing  sheath and catheters             2.  Mechanical thrombectomy of the left SFA and popliteal arteries with the penumbra CAT 7 bolt device             3.  Percutaneous transluminal angioplasty of the left SFA and popliteal arteries with a 5 mm diameter scoring balloon and 6 mm diameter high-pressure angioplasty balloon             4.  Percutaneous transluminal angioplasty of left tibioperoneal trunk and posterior tibial artery with a 3 mm diameter by 15 cm length angioplasty balloon             5.  Stent placement to the left SFA and popliteal arteries with a 7 mm diameter by 25 cm length Viabahn stent             6.  StarClose closure device right femoral artery  Post procedure he has had some groin oozing which appears to be controlled. No hematoma or significant complication.    Hospital Course:  [name] is a 71 y.o. male is S/P Left Lower extremity angiogram  Extubated: POD # 0 Physical Exam:  Alert notes x3, no acute distress Face: Symmetrical.  Tongue is midline. Neck: Trachea is midline.  No swelling or bruising. Cardiovascular: Regular rate and rhythm Pulmonary: Clear to auscultation bilaterally Abdomen: Soft, nontender, nondistended Right groin access: Clean dry and intact.  No swelling or drainage noted Left groin access: Clean dry and intact.  No swelling or drainage noted Left lower extremity: Thigh soft.  Calf soft.  Extremities warm distally toes.  Hard to palpate pedal pulses however the foot is warm is her good capillary refill. Right lower extremity: Thigh soft.  Calf soft.  Extremities warm distally toes.  Hard to palpate pedal pulses however the foot is warm is her good capillary refill. Neurological: No deficits noted   Post-op wounds:  clean, dry, intact or healing well  Pt. Ambulating, voiding and taking PO diet without difficulty. Pt pain controlled with PO pain meds.  Labs:  As below  Complications: none  Consults:    Significant Diagnostic  Studies: CBC Lab Results  Component Value Date   WBC 3.4 (L) 12/28/2021   HGB 14.6 12/28/2021   HCT 44.5 12/28/2021   MCV 98.9 12/28/2021   PLT 152 12/28/2021    BMET    Component Value Date/Time   NA 138 12/06/2021 1032   NA 144 11/18/2020 1011   K 4.4 12/06/2021 1032   CL 107 12/06/2021 1032   CO2 25 12/06/2021 1032   GLUCOSE 118 (H) 12/06/2021 1032   BUN 36 (H) 12/27/2021 1341   BUN 19 11/18/2020 1011   CREATININE 0.99 12/27/2021 1341   CREATININE 1.12 07/23/2015 1016   CALCIUM 9.1 12/06/2021 1032   GFRNONAA >60 12/27/2021 1341   GFRAA 85 04/22/2020 1013   COAG Lab Results  Component Value Date   INR 0.9 02/15/2021   INR 1.2 10/07/2020   INR 1.0 09/30/2020     Disposition:  Discharge to :Home  Allergies as of 12/29/2021       Reactions   Brilinta [ticagrelor] Shortness Of Breath   Chlorhexidine Gluconate Other (See Comments)   Skin burning for hours afterward   Contrast Media [iodinated Contrast Media] Itching   Face and head flushing, nose itching after contrast administation for angiogram   Statins Other (See Comments)   Failed Crestor 5 mg twice weekly, Crestor 20 mg daily, Pravastatin 40 mg qd, Lipitor, Zocor - muscle aches   Zetia [ezetimibe] Other (See Comments)   Muscle aches        Medication List     TAKE these medications    aspirin EC 81 MG tablet Take 1 tablet (81 mg total) by mouth daily.   carvedilol 12.5 MG tablet Commonly known as: COREG TAKE (1) TABLET BY MOUTH TWICE DAILY   colchicine 0.6 MG tablet TAKE 1 TABLET BY MOUTH 2 TIMES DAILY.   Eliquis 5 MG Tabs tablet Generic drug: apixaban TAKE 1 TABLET BY MOUTH TWICE A DAY   mexiletine 150 MG capsule Commonly known as: MEXITIL Take 1 capsule (150 mg total) by mouth 2 (two) times daily.   nitroGLYCERIN 0.4 MG SL tablet Commonly known as: Nitrostat Place 1 tablet (0.4 mg total) under the tongue every 5 (five) minutes as needed for chest pain.   Praluent 150 MG/ML  [place] Generic drug: Alirocumab Inject 150 mg into the skin every 14 (fourteen) days.   sacubitril-valsartan 49-51 MG Commonly known as: ENTRESTO Take 1 tablet by mouth 2 (two) times daily. Need to make an appointment for further refills   traZODone 50 MG tablet Commonly known as: DESYREL Take 1 tablet (50 mg total) by mouth at bedtime as needed. for sleep       Verbal and written Discharge instructions given to the patient. Wound care per Discharge AVS  Follow-up Information     Dew, Erskine Squibb, MD Follow up.   Specialties: Vascular  Surgery, Radiology, Interventional Cardiology Why: See JD or FB in 3-4 weeks with ABI Contact information: [place] [address] [phone]                 Signed: Kris Hartmann, NP  12/29/2021, 12:21 PM

## 2021-12-30 ENCOUNTER — Telehealth: Payer: Self-pay | Admitting: Internal Medicine

## 2021-12-30 NOTE — Telephone Encounter (Signed)
Pts wife is calling to request a new referral to [facility] Pieter Partridge MD [phone]. Pt was previously going to [facility] Please advise with Patient CB- [phone]

## 2021-12-31 ENCOUNTER — Encounter: Payer: Self-pay | Admitting: *Deleted

## 2021-12-31 ENCOUNTER — Telehealth: Payer: Self-pay | Admitting: *Deleted

## 2021-12-31 ENCOUNTER — Telehealth: Payer: Self-pay

## 2021-12-31 NOTE — Telephone Encounter (Signed)
error 

## 2021-12-31 NOTE — Patient Outreach (Signed)
  Care Coordination TOC Note Transition Care Management Follow-up Telephone Call Date of discharge and from where: Wednesday, 12/29/21- [facility]; Lower extremity ischemic leg; angiography/ thrombectomy, stent placement- angioplasty How have you been since you were released from the hospital? "Overall, things are going well.  My left leg is a little sore, but it's slowly improving.  I am able to walk around and get where I need to in the house.  My wife is helping me with anything at all I might need help with, but really, I am doing the things I need to to take care of myself without any real problems" Any questions or concerns? No  Items Reviewed: Did the pt receive and understand the discharge instructions provided? Yes  Medications obtained and verified? Yes  declines medication review, confirms no changes to medications post- hospital discharge on 12/29/21 Other? No  Any new allergies since your discharge? No  Dietary orders reviewed? Yes Do you have support at home? Yes wife assisting with ADL's and iADL's as indicated/ needed  Home Care and Equipment/Supplies: Were home health services ordered? no If so, what is the name of the agency? N/A  Has the agency set up a time to come to the patient's home? not applicable Were any new equipment or medical supplies ordered?  No What is the name of the medical supply agency? N/A Were you able to get the supplies/equipment? not applicable Do you have any questions related to the use of the equipment or supplies? No N/A  Functional Questionnaire: (I = Independent and D = Dependent) ADLs: I  wife assisting with ADL's and iADL's as indicated/ needed  Bathing/Dressing- I wife assisting with ADL's and iADL's as indicated/ needed  Meal Prep- D  wife prepares meals  Eating- I  Maintaining continence- I  Transferring/Ambulation- I  Managing Meds- D  wife handles medications  Follow up appointments reviewed:  PCP Hospital f/u appt confirmed? No   Scheduled to see - on - @ [facility] f/u appt confirmed? Yes  Scheduled to see Vascular surgeon provider, Dr. Lucky Cowboy team on Friday 01/28/22 @ 2:30 pm-- 3:45 pm, includes follow up ABI's; verified this is recommended time frame for follow up by surgical provider notes in EHR; confirmed patient has vascular surgeon contact information Are transportation arrangements needed? No  If their condition worsens, is the pt aware to call PCP or go to the Emergency Dept.? Yes Was the patient provided with contact information for the PCP's office or ED? Yes Was to pt encouraged to call back with questions or concerns? Yes  SDOH assessments and interventions completed:   Yes  Care Coordination Interventions Activated:  No   Care Coordination Interventions:  No Care Coordination interventions needed at this time.   Encounter Outcome:  Pt. Visit Completed    Oneta Rack, RN, BSN, CCRN Alumnus RN CM Care Coordination/ Transition of [place] Management [phone]: direct office

## 2022-01-04 NOTE — Progress Notes (Signed)
Remote ICD transmission.   

## 2022-01-06 ENCOUNTER — Other Ambulatory Visit: Payer: Self-pay | Admitting: *Deleted

## 2022-01-06 ENCOUNTER — Other Ambulatory Visit (HOSPITAL_COMMUNITY): Payer: Self-pay

## 2022-01-06 ENCOUNTER — Other Ambulatory Visit: Payer: Self-pay

## 2022-01-06 DIAGNOSIS — I739 Peripheral vascular disease, unspecified: Secondary | ICD-10-CM

## 2022-01-06 MED ORDER — SACUBITRIL-VALSARTAN 49-51 MG PO TABS: 1.0000 | ORAL_TABLET | Freq: Two times a day (BID) | ORAL | 0 refills | Status: DC

## 2022-01-07 ENCOUNTER — Telehealth (HOSPITAL_COMMUNITY): Payer: Self-pay

## 2022-01-07 ENCOUNTER — Other Ambulatory Visit (HOSPITAL_COMMUNITY): Payer: Self-pay | Admitting: *Deleted

## 2022-01-07 MED ORDER — SACUBITRIL-VALSARTAN 49-51 MG PO TABS: 1.0000 | ORAL_TABLET | Freq: Two times a day (BID) | ORAL | 3 refills | Status: DC

## 2022-01-07 NOTE — Telephone Encounter (Signed)
Advanced Heart Failure Patient Advocate Encounter  The patient was approved for a [employer] that will help cover the cost of Entresto.  Total amount awarded, $10,000.  Effective: 12/07/2021 - 12/07/2022.  BIN [id number] PCN [id number] [id number] ID [id number]  Patient was provided with approval and processing information via email.  Clista Bernhardt, CPhT Rx Patient Advocate Phone: [phone]

## 2022-01-12 ENCOUNTER — Encounter: Payer: Self-pay | Admitting: Vascular Surgery

## 2022-01-12 ENCOUNTER — Other Ambulatory Visit: Payer: Self-pay

## 2022-01-12 ENCOUNTER — Telehealth: Payer: Self-pay | Admitting: Internal Medicine

## 2022-01-12 ENCOUNTER — Encounter: Payer: Self-pay | Admitting: *Deleted

## 2022-01-12 ENCOUNTER — Other Ambulatory Visit: Payer: Self-pay | Admitting: *Deleted

## 2022-01-12 ENCOUNTER — Ambulatory Visit (INDEPENDENT_AMBULATORY_CARE_PROVIDER_SITE_OTHER): Payer: PPO | Admitting: Vascular Surgery

## 2022-01-12 ENCOUNTER — Ambulatory Visit (HOSPITAL_COMMUNITY)
Admission: RE | Admit: 2022-01-12 | Discharge: 2022-01-12 | Disposition: A | Discharge status: Home / Self Care | Payer: PPO | Source: Ambulatory Visit | Attending: Vascular Surgery | Admitting: Vascular Surgery

## 2022-01-12 VITALS — BP 132/86 | HR 74 | Temp 98.5°F | Resp 20 | Ht 66.0 in | Wt 177.0 lb

## 2022-01-12 DIAGNOSIS — I70222 Atherosclerosis of native arteries of extremities with rest pain, left leg: Secondary | ICD-10-CM

## 2022-01-12 DIAGNOSIS — I739 Peripheral vascular disease, unspecified: Secondary | ICD-10-CM | POA: Diagnosis not present

## 2022-01-12 DIAGNOSIS — Z9889 Other specified postprocedural states: Secondary | ICD-10-CM

## 2022-01-12 MED ORDER — PRALUENT 150 MG/ML ~~LOC~~ SOAJ: 150.0000 mg | SUBCUTANEOUS | 3 refills | Status: DC

## 2022-01-12 NOTE — Telephone Encounter (Signed)
Patient dropped off PAF placed in box 

## 2022-01-12 NOTE — H&P (View-Only) (Signed)
Patient ID: [name], male   DOB: 1950-07-17, 72 y.o.   MRN: [id number]  Reason for Consult: New Patient (Initial Visit)   Referred by Glean Hess, MD  Subjective:     HPI:  [name] is a 71 y.o. male male with history of right carotid endarterectomy for asymptomatic disease performed several years ago and also more recently coronary artery bypass in 2021.  He has been treated for left lower extremity claudication with serial stenting and lytic therapy most recently discharged from the hospital just 2 weeks ago.  He now states that he has numbness of the foot really unable to walk far enough to have cramping of the calf.  He is on Eliquis as well as aspirin daily.  He does have a small ulcer on the posterior aspect of the left calf but no other wounds.  Past Medical History:  Diagnosis Date   Abnormal nuclear cardiac imaging test 08/08/2015   Arthritis    fingers   Carotid artery occlusion    CHF (congestive heart failure) (HCC)    Coronary atherosclerosis of native coronary artery 01/29/2013   11/05/19 R/LHC 80% dLMCA stenosis small diffusely dz dLAD, chronically occluded OM1 50%mRCA lesion, widely patent mLCx strent, moderately elevated L heart filling pressures, mild to moderate RH filling pressures, normal to moderately reduced CO   Duodenal erosion    Encounter for screening for lung cancer 07/13/2016   Esophageal stenosis    esophageal dilation   GERD (gastroesophageal reflux disease)    H. pylori infection    Heart attack ([place]) Oct. 2009   Mild   Hiatal hernia    Hyperlipidemia    Hypertension    Old myocardial infarction 11/29/2007   Mildly elevated troponin, isolated value in October 2009. Cardiac catheterization-nonobstructive 60% RCA disease-subsequent nuclear stress test-9 minutes, low risk, mild inferior wall hypokinesis    Pain in limb 12/19/2017   Peripheral vascular disease ([place])    Unstable angina ([place]) 11/25/2017   Family History  Problem Relation  Age of Onset   Heart attack Mother    Coronary artery disease Mother    Heart disease Mother        Carotid Stenosis and BPG and Heart Disease before age 51   Diabetes Mother    Hypertension Mother    Heart attack Father    Heart disease Father        BPG and Heart Disease before age 34   Hypertension Father    Cancer Father 62       throat   Stroke Father    Colon cancer Neg Hx    Colon polyps Neg Hx    Esophageal cancer Neg Hx    Rectal cancer Neg Hx    Stomach cancer Neg Hx    Past Surgical History:  Procedure Laterality Date   APPENDECTOMY     BACK SURGERY     BIV ICD INSERTION CRT-D N/A 03/23/2020   Procedure: BIV ICD INSERTION CRT-D;  Surgeon: Vickie Epley, MD;  Location: [place] [facility];  Service: Cardiovascular;  Laterality: N/A;   CARDIAC CATHETERIZATION N/A 08/07/2015   Procedure: Left Heart Cath and Coronary Angiography;  Surgeon: [name], MD;  Location: [place] [facility];  Service: Cardiovascular;  Laterality: N/A;   CARDIAC CATHETERIZATION N/A 08/07/2015   Procedure: Coronary Stent Intervention;  Surgeon: [name], MD;  Location: [place] [facility];  Service: Cardiovascular;  Laterality: N/A;   CARDIAC CATHETERIZATION  N/A 08/07/2015   Procedure: Coronary Stent Intervention;  Surgeon: [name], MD;  Location: [place] [facility];  Service: Cardiovascular;  Laterality: N/A;   CAROTID ENDARTERECTOMY  01/05/2006   Right  CEA with DPA   CATARACT EXTRACTION W/ INTRAOCULAR LENS IMPLANT Left 12/04/2017   CATARACT EXTRACTION W/PHACO Left 12/04/2017   Procedure: CATARACT EXTRACTION PHACO AND INTRAOCULAR LENS PLACEMENT ([place]) LEFT;  Surgeon: Eulogio Bear, MD;  Location: [place];  Service: Ophthalmology;  Laterality: Left;   CATARACT EXTRACTION W/PHACO Right 02/06/2018   Procedure: CATARACT EXTRACTION PHACO AND INTRAOCULAR LENS PLACEMENT (IOC)RIGHT;  Surgeon: Eulogio Bear, MD;  Location: [place];  Service: Ophthalmology;   Laterality: Right;   COLONOSCOPY  05/20/2008   COLONOSCOPY WITH PROPOFOL N/A 09/13/2018   Procedure: COLONOSCOPY WITH BIOPSY;  Surgeon: [name], MD;  Location: [place];  Service: Endoscopy;  Laterality: N/A;   CORONARY ARTERY BYPASS GRAFT N/A 11/11/2019   Procedure: CORONARY ARTERY BYPASS GRAFTING (CABG) USING LIMA to Diag1; ENDOSCOPICALLY HARVESTED RIGHT GREATER SAPHENOUS VEIN: SVG to OM1; SVG to OM2; SVG to PDA.;  Surgeon: Ivin Poot, MD;  Location: [place];  Service: Open Heart Surgery;  Laterality: N/A;   CORONARY STENT PLACEMENT  08/07/2015   MID CIRCUMFLEX   ENDARTERECTOMY FEMORAL Bilateral 09/30/2020   Procedure: ENDARTERECTOMY FEMORAL;  Surgeon: Algernon Huxley, MD;  Location: [facility];  Service: Vascular;  Laterality: Bilateral;   ENDOVEIN HARVEST OF GREATER SAPHENOUS VEIN Right 11/11/2019   Procedure: ENDOVEIN HARVEST OF GREATER SAPHENOUS VEIN;  Surgeon: Ivin Poot, MD;  Location: [place];  Service: Open Heart Surgery;  Laterality: Right;   ESOPHAGOGASTRODUODENOSCOPY (EGD) WITH PROPOFOL N/A 02/11/2019   Procedure: ESOPHAGOGASTRODUODENOSCOPY (EGD) WITH BIOPSY and  Dilation;  Surgeon: [name], MD;  Location: [place];  Service: Endoscopy;  Laterality: N/A;   ESOPHAGOGASTRODUODENOSCOPY (EGD) WITH PROPOFOL N/A 12/23/2021   Procedure: ESOPHAGOGASTRODUODENOSCOPY (EGD) WITH PROPOFOL;  Surgeon: [name], MD;  Location: [facility];  Service: Endoscopy;  Laterality: N/A;   HIP SURGERY Left 10/2016   left hip tendon repair   LEFT HEART CATH AND CORONARY ANGIOGRAPHY N/A 11/27/2017   Procedure: LEFT HEART CATH AND CORONARY ANGIOGRAPHY;  Surgeon: Wellington Hampshire, MD;  Location: [place] [facility];  Service: Cardiovascular;  Laterality: N/A;   LOWER EXTREMITY ANGIOGRAPHY Left 02/12/2018   Procedure: LOWER EXTREMITY ANGIOGRAPHY;  Surgeon: Algernon Huxley, MD;  Location: [place] [facility];  Service: Cardiovascular;  Laterality: Left;   LOWER EXTREMITY  ANGIOGRAPHY Left 03/07/2018   Procedure: LOWER EXTREMITY ANGIOGRAPHY;  Surgeon: Algernon Huxley, MD;  Location: [place] [facility];  Service: Cardiovascular;  Laterality: Left;   LOWER EXTREMITY ANGIOGRAPHY Left 06/04/2018   Procedure: LOWER EXTREMITY ANGIOGRAPHY;  Surgeon: Algernon Huxley, MD;  Location: [place] [facility];  Service: Cardiovascular;  Laterality: Left;   LOWER EXTREMITY ANGIOGRAPHY Left 09/17/2020   Procedure: LOWER EXTREMITY ANGIOGRAPHY;  Surgeon: Algernon Huxley, MD;  Location: [place] [facility];  Service: Cardiovascular;  Laterality: Left;   LOWER EXTREMITY ANGIOGRAPHY Left 09/28/2020   Procedure: LOWER EXTREMITY ANGIOGRAPHY;  Surgeon: Algernon Huxley, MD;  Location: [place] [facility];  Service: Cardiovascular;  Laterality: Left;   LOWER EXTREMITY ANGIOGRAPHY N/A 02/15/2021   Procedure: LOWER EXTREMITY ANGIOGRAPHY;  Surgeon: Algernon Huxley, MD;  Location: [place] [facility];  Service: Cardiovascular;  Laterality: N/A;   LOWER EXTREMITY ANGIOGRAPHY Left 12/27/2021   Procedure: Lower Extremity Angiography;  Surgeon: [name]  S, MD;  Location: [place] [facility];  Service: Cardiovascular;  Laterality: Left;   LOWER EXTREMITY ANGIOGRAPHY Left 12/28/2021   Procedure: Lower Extremity Angiography;  Surgeon: Algernon Huxley, MD;  Location: [place] [facility];  Service: Cardiovascular;  Laterality: Left;   PLACEMENT OF IMPELLA LEFT VENTRICULAR ASSIST DEVICE N/A 11/11/2019   Procedure: PLACEMENT OF IMPELLA LEFT VENTRICULAR ASSIST DEVICE 5.5;  Surgeon: Ivin Poot, MD;  Location: [place];  Service: Open Heart Surgery;  Laterality: N/A;  Midline Sternotomy   POLYPECTOMY N/A 09/13/2018   Procedure: POLYPECTOMY;  Surgeon: [name], MD;  Location: [place];  Service: Endoscopy;  Laterality: N/A;   POLYPECTOMY N/A 02/11/2019   Procedure: POLYPECTOMY;  Surgeon: [name], MD;  Location: [place];  Service: Endoscopy;  Laterality: N/A;   REMOVAL OF IMPELLA LEFT  VENTRICULAR ASSIST DEVICE N/A 11/15/2019   Procedure: REMOVAL OF IMPELLA 5.5 LEFT VENTRICULAR ASSIST DEVICE;  Surgeon: Ivin Poot, MD;  Location: [place];  Service: Open Heart Surgery;  Laterality: N/A;   RIGHT/LEFT HEART CATH AND CORONARY ANGIOGRAPHY N/A 11/05/2019   Procedure: RIGHT/LEFT HEART CATH AND CORONARY ANGIOGRAPHY;  Surgeon: Nelva Bush, MD;  Location: [place] [facility];  Service: Cardiovascular;  Laterality: N/A;   SPINE SURGERY     TEE WITHOUT CARDIOVERSION N/A 11/11/2019   Procedure: TRANSESOPHAGEAL ECHOCARDIOGRAM (TEE);  Surgeon: Prescott Gum, Collier Salina, MD;  Location: [place];  Service: Open Heart Surgery;  Laterality: N/A;   TEE WITHOUT CARDIOVERSION N/A 11/15/2019   Procedure: TRANSESOPHAGEAL ECHOCARDIOGRAM (TEE);  Surgeon: Prescott Gum, Collier Salina, MD;  Location: [place];  Service: Open Heart Surgery;  Laterality: N/A;   TONSILLECTOMY      Short Social History:  Social History   Tobacco Use   Smoking status: Former    Packs/day: 1.25    Years: 35.00    Total pack years: 43.75    Types: Cigarettes    Quit date: 02/28/2005    Years since quitting: 16.8   Smokeless tobacco: Current    Types: Snuff   Tobacco comments:    occaisionally  Substance Use Topics   Alcohol use: Yes    Alcohol/week: 8.0 - 10.0 standard drinks of alcohol    Types: 8 - 10 Glasses of wine per week    Comment: weekly    Allergies  Allergen Reactions   Brilinta [Ticagrelor] Shortness Of Breath   Chlorhexidine Gluconate Other (See Comments)    Skin burning for hours afterward   Contrast Media [Iodinated Contrast Media] Itching    Face and head flushing, nose itching after contrast administation for angiogram   Statins Other (See Comments)    Failed Crestor 5 mg twice weekly, Crestor 20 mg daily, Pravastatin 40 mg qd, Lipitor, Zocor - muscle aches   Zetia [Ezetimibe] Other (See Comments)    Muscle aches    Current Outpatient Medications  Medication Sig Dispense Refill   aspirin EC 81 MG tablet Take  1 tablet (81 mg total) by mouth daily. 150 tablet 2   carvedilol (COREG) 12.5 MG tablet TAKE (1) TABLET BY MOUTH TWICE DAILY 180 tablet 1   colchicine 0.6 MG tablet TAKE 1 TABLET BY MOUTH 2 TIMES DAILY. 30 tablet 0   ELIQUIS 5 MG TABS tablet TAKE 1 TABLET BY MOUTH TWICE A DAY 180 tablet 5   mexiletine (MEXITIL) 150 MG capsule Take 1 capsule (150 mg total) by mouth 2 (two) times daily. 180 capsule 3   nitroGLYCERIN (NITROSTAT) 0.4 MG SL tablet  Place 1 tablet (0.4 mg total) under the tongue every 5 (five) minutes as needed for chest pain. 25 tablet 2   PRALUENT 150 MG/ML SOAJ Inject 150 mg into the skin every 14 (fourteen) days. 6 mL 3   sacubitril-valsartan (ENTRESTO) 49-51 MG Take 1 tablet by mouth 2 (two) times daily. 180 tablet 3   traZODone (DESYREL) 50 MG tablet Take 1 tablet (50 mg total) by mouth at bedtime as needed. for sleep 90 tablet 1   No current facility-administered medications for this visit.    Review of Systems  Constitutional:  Constitutional negative. HENT: HENT negative.  Respiratory: Respiratory negative.  Cardiovascular: Positive for claudication.  GI: Gastrointestinal negative.  Musculoskeletal: Positive for leg pain.  Skin: Skin negative.  Neurological: Positive for numbness.  Hematologic: Hematologic/lymphatic negative.  Psychiatric: Psychiatric negative.        Objective:  Objective   Vitals:   01/12/22 1401  BP: 132/86  Pulse: 74  Resp: 20  Temp: 98.5 F (36.9 C)  SpO2: 93%  Weight: 177 lb (80.3 kg)  Height: '5\' 6"'$  (1.676 m)   Body mass index is 28.57 kg/m.  Physical Exam Constitutional:      Appearance: He is normal weight.  HENT:     Head: Normocephalic.     Nose: Nose normal.  Eyes:     Pupils: Pupils are equal, round, and reactive to light.  Cardiovascular:     Rate and Rhythm: Normal rate.     Pulses:          Femoral pulses are 2+ on the right side and 2+ on the left side.      Dorsalis pedis pulses are 2+ on the right side.        Posterior tibial pulses are 2+ on the right side.  Pulmonary:     Effort: Pulmonary effort is normal.  Abdominal:     General: Abdomen is flat.     Palpations: Abdomen is soft.  Neurological:     Mental Status: He is alert.     Data: ABI Findings:  +---------+------------------+-----+---------+--------+  Right   Rt Pressure (mmHg)IndexWaveform Comment   +---------+------------------+-----+---------+--------+  Brachial 167                                       +---------+------------------+-----+---------+--------+  PTA     164               0.94 biphasic           +---------+------------------+-----+---------+--------+  DP      158               0.91 triphasic          +---------+------------------+-----+---------+--------+  Great Toe155               0.89                    +---------+------------------+-----+---------+--------+   +---------+------------------+-----+----------+-------+  Left    Lt Pressure (mmHg)IndexWaveform  Comment  +---------+------------------+-----+----------+-------+  Brachial 174                                       +---------+------------------+-----+----------+-------+  PTA     84                0.48 monophasic         +---------+------------------+-----+----------+-------+  DP      65                0.37 monophasic         +---------+------------------+-----+----------+-------+  Great Toe0                 0.00                    +---------+------------------+-----+----------+-------+   +-------+-----------+-----------+------------+------------+  ABI/TBIToday's ABIToday's TBIPrevious ABIPrevious TBI  +-------+-----------+-----------+------------+------------+  Right 0.94       0.89       1.00        0.54          +-------+-----------+-----------+------------+------------+  Left  0.48       0.00       0.98        0.56           +-------+-----------+-----------+------------+------------+       Bilateral ABIs appear decreased compared to prior study on 10/05/21.    Summary:  Right: Resting right ankle-brachial index indicates mild right lower  extremity arterial disease. The right toe-brachial index is abnormal.   Left: Resting left ankle-brachial index indicates severe left lower  extremity arterial disease. Absent left great toe PPG waveform.       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  I have recommended bypass surgery but will need updated angiogram prior.  We will plan for angiography on November 27 followed by admission to the hospital and bypass graft from adequate native SFA to healthy below-knee popliteal artery and possibly will need embolectomy at the time.  We will need to hold anticoagulation for 48 hours prior to procedure.  With any luck we can bypass SFA to popliteal to avoid reexposure of the left common femoral artery where he has had previous endarterectomy and hopefully he will have saphenous vein for bypass as well though it appears he does have saphenous vein below the knee that would be adequate for bypass.  We discussed the risk and benefits particularly to his heart as well as healing issues and he demonstrates good understanding we will have him scheduled after Thanksgiving.     Waynetta Sandy MD [facility]

## 2022-01-12 NOTE — Progress Notes (Signed)
Patient ID: [name], male   DOB: Mar 25, 1950, 71 y.o.   MRN: [id number]  Reason for Consult: New Patient (Initial Visit)   Referred by Glean Hess, MD  Subjective:     HPI:  [name] is a 71 y.o. male male with history of right carotid endarterectomy for asymptomatic disease performed several years ago and also more recently coronary artery bypass in 2021.  He has been treated for left lower extremity claudication with serial stenting and lytic therapy most recently discharged from the hospital just 2 weeks ago.  He now states that he has numbness of the foot really unable to walk far enough to have cramping of the calf.  He is on Eliquis as well as aspirin daily.  He does have a small ulcer on the posterior aspect of the left calf but no other wounds.  Past Medical History:  Diagnosis Date   Abnormal nuclear cardiac imaging test 08/08/2015   Arthritis    fingers   Carotid artery occlusion    CHF (congestive heart failure) (HCC)    Coronary atherosclerosis of native coronary artery 01/29/2013   11/05/19 R/LHC 80% dLMCA stenosis small diffusely dz dLAD, chronically occluded OM1 50%mRCA lesion, widely patent mLCx strent, moderately elevated L heart filling pressures, mild to moderate RH filling pressures, normal to moderately reduced CO   Duodenal erosion    Encounter for screening for lung cancer 07/13/2016   Esophageal stenosis    esophageal dilation   GERD (gastroesophageal reflux disease)    H. pylori infection    Heart attack ([place]) Oct. 2009   Mild   Hiatal hernia    Hyperlipidemia    Hypertension    Old myocardial infarction 11/29/2007   Mildly elevated troponin, isolated value in October 2009. Cardiac catheterization-nonobstructive 60% RCA disease-subsequent nuclear stress test-9 minutes, low risk, mild inferior wall hypokinesis    Pain in limb 12/19/2017   Peripheral vascular disease ([place])    Unstable angina ([place]) 11/25/2017   Family History  Problem Relation  Age of Onset   Heart attack Mother    Coronary artery disease Mother    Heart disease Mother        Carotid Stenosis and BPG and Heart Disease before age 64   Diabetes Mother    Hypertension Mother    Heart attack Father    Heart disease Father        BPG and Heart Disease before age 72   Hypertension Father    Cancer Father 80       throat   Stroke Father    Colon cancer Neg Hx    Colon polyps Neg Hx    Esophageal cancer Neg Hx    Rectal cancer Neg Hx    Stomach cancer Neg Hx    Past Surgical History:  Procedure Laterality Date   APPENDECTOMY     BACK SURGERY     BIV ICD INSERTION CRT-D N/A 03/23/2020   Procedure: BIV ICD INSERTION CRT-D;  Surgeon: Vickie Epley, MD;  Location: [place] [facility];  Service: Cardiovascular;  Laterality: N/A;   CARDIAC CATHETERIZATION N/A 08/07/2015   Procedure: Left Heart Cath and Coronary Angiography;  Surgeon: [name], MD;  Location: [place] [facility];  Service: Cardiovascular;  Laterality: N/A;   CARDIAC CATHETERIZATION N/A 08/07/2015   Procedure: Coronary Stent Intervention;  Surgeon: [name], MD;  Location: [place] [facility];  Service: Cardiovascular;  Laterality: N/A;   CARDIAC CATHETERIZATION  N/A 08/07/2015   Procedure: Coronary Stent Intervention;  Surgeon: [name], MD;  Location: [place] [facility];  Service: Cardiovascular;  Laterality: N/A;   CAROTID ENDARTERECTOMY  01/05/2006   Right  CEA with DPA   CATARACT EXTRACTION W/ INTRAOCULAR LENS IMPLANT Left 12/04/2017   CATARACT EXTRACTION W/PHACO Left 12/04/2017   Procedure: CATARACT EXTRACTION PHACO AND INTRAOCULAR LENS PLACEMENT ([place]) LEFT;  Surgeon: Eulogio Bear, MD;  Location: [place];  Service: Ophthalmology;  Laterality: Left;   CATARACT EXTRACTION W/PHACO Right 02/06/2018   Procedure: CATARACT EXTRACTION PHACO AND INTRAOCULAR LENS PLACEMENT ([facility])RIGHT;  Surgeon: Eulogio Bear, MD;  Location: [place];  Service: Ophthalmology;   Laterality: Right;   COLONOSCOPY  05/20/2008   COLONOSCOPY WITH PROPOFOL N/A 09/13/2018   Procedure: COLONOSCOPY WITH BIOPSY;  Surgeon: [name], MD;  Location: [place];  Service: Endoscopy;  Laterality: N/A;   CORONARY ARTERY BYPASS GRAFT N/A 11/11/2019   Procedure: CORONARY ARTERY BYPASS GRAFTING (CABG) USING LIMA to Diag1; ENDOSCOPICALLY HARVESTED RIGHT GREATER SAPHENOUS VEIN: SVG to OM1; SVG to OM2; SVG to PDA.;  Surgeon: Ivin Poot, MD;  Location: [place];  Service: Open Heart Surgery;  Laterality: N/A;   CORONARY STENT PLACEMENT  08/07/2015   MID CIRCUMFLEX   ENDARTERECTOMY FEMORAL Bilateral 09/30/2020   Procedure: ENDARTERECTOMY FEMORAL;  Surgeon: Algernon Huxley, MD;  Location: [facility];  Service: Vascular;  Laterality: Bilateral;   ENDOVEIN HARVEST OF GREATER SAPHENOUS VEIN Right 11/11/2019   Procedure: ENDOVEIN HARVEST OF GREATER SAPHENOUS VEIN;  Surgeon: Ivin Poot, MD;  Location: [place];  Service: Open Heart Surgery;  Laterality: Right;   ESOPHAGOGASTRODUODENOSCOPY (EGD) WITH PROPOFOL N/A 02/11/2019   Procedure: ESOPHAGOGASTRODUODENOSCOPY (EGD) WITH BIOPSY and  Dilation;  Surgeon: [name], MD;  Location: [place];  Service: Endoscopy;  Laterality: N/A;   ESOPHAGOGASTRODUODENOSCOPY (EGD) WITH PROPOFOL N/A 12/23/2021   Procedure: ESOPHAGOGASTRODUODENOSCOPY (EGD) WITH PROPOFOL;  Surgeon: [name], MD;  Location: [facility];  Service: Endoscopy;  Laterality: N/A;   HIP SURGERY Left 10/2016   left hip tendon repair   LEFT HEART CATH AND CORONARY ANGIOGRAPHY N/A 11/27/2017   Procedure: LEFT HEART CATH AND CORONARY ANGIOGRAPHY;  Surgeon: Wellington Hampshire, MD;  Location: [place] [facility];  Service: Cardiovascular;  Laterality: N/A;   LOWER EXTREMITY ANGIOGRAPHY Left 02/12/2018   Procedure: LOWER EXTREMITY ANGIOGRAPHY;  Surgeon: Algernon Huxley, MD;  Location: [place] [facility];  Service: Cardiovascular;  Laterality: Left;   LOWER EXTREMITY  ANGIOGRAPHY Left 03/07/2018   Procedure: LOWER EXTREMITY ANGIOGRAPHY;  Surgeon: Algernon Huxley, MD;  Location: [place] [facility];  Service: Cardiovascular;  Laterality: Left;   LOWER EXTREMITY ANGIOGRAPHY Left 06/04/2018   Procedure: LOWER EXTREMITY ANGIOGRAPHY;  Surgeon: Algernon Huxley, MD;  Location: [facility] [facility];  Service: Cardiovascular;  Laterality: Left;   LOWER EXTREMITY ANGIOGRAPHY Left 09/17/2020   Procedure: LOWER EXTREMITY ANGIOGRAPHY;  Surgeon: Algernon Huxley, MD;  Location: [place] [facility];  Service: Cardiovascular;  Laterality: Left;   LOWER EXTREMITY ANGIOGRAPHY Left 09/28/2020   Procedure: LOWER EXTREMITY ANGIOGRAPHY;  Surgeon: Algernon Huxley, MD;  Location: [place] [facility];  Service: Cardiovascular;  Laterality: Left;   LOWER EXTREMITY ANGIOGRAPHY N/A 02/15/2021   Procedure: LOWER EXTREMITY ANGIOGRAPHY;  Surgeon: Algernon Huxley, MD;  Location: [place] [facility];  Service: Cardiovascular;  Laterality: N/A;   LOWER EXTREMITY ANGIOGRAPHY Left 12/27/2021   Procedure: Lower Extremity Angiography;  Surgeon: [name]  S, MD;  Location: [place];  Service: Cardiovascular;  Laterality: Left;   LOWER EXTREMITY ANGIOGRAPHY Left 12/28/2021   Procedure: Lower Extremity Angiography;  Surgeon: Algernon Huxley, MD;  Location: [place] [facility];  Service: Cardiovascular;  Laterality: Left;   PLACEMENT OF IMPELLA LEFT VENTRICULAR ASSIST DEVICE N/A 11/11/2019   Procedure: PLACEMENT OF IMPELLA LEFT VENTRICULAR ASSIST DEVICE 5.5;  Surgeon: Ivin Poot, MD;  Location: [place];  Service: Open Heart Surgery;  Laterality: N/A;  Midline Sternotomy   POLYPECTOMY N/A 09/13/2018   Procedure: POLYPECTOMY;  Surgeon: [name], MD;  Location: [place];  Service: Endoscopy;  Laterality: N/A;   POLYPECTOMY N/A 02/11/2019   Procedure: POLYPECTOMY;  Surgeon: [name], MD;  Location: [place];  Service: Endoscopy;  Laterality: N/A;   REMOVAL OF IMPELLA LEFT  VENTRICULAR ASSIST DEVICE N/A 11/15/2019   Procedure: REMOVAL OF IMPELLA 5.5 LEFT VENTRICULAR ASSIST DEVICE;  Surgeon: Ivin Poot, MD;  Location: [place];  Service: Open Heart Surgery;  Laterality: N/A;   RIGHT/LEFT HEART CATH AND CORONARY ANGIOGRAPHY N/A 11/05/2019   Procedure: RIGHT/LEFT HEART CATH AND CORONARY ANGIOGRAPHY;  Surgeon: Nelva Bush, MD;  Location: [facility];  Service: Cardiovascular;  Laterality: N/A;   SPINE SURGERY     TEE WITHOUT CARDIOVERSION N/A 11/11/2019   Procedure: TRANSESOPHAGEAL ECHOCARDIOGRAM (TEE);  Surgeon: Prescott Gum, Collier Salina, MD;  Location: [place];  Service: Open Heart Surgery;  Laterality: N/A;   TEE WITHOUT CARDIOVERSION N/A 11/15/2019   Procedure: TRANSESOPHAGEAL ECHOCARDIOGRAM (TEE);  Surgeon: Prescott Gum, Collier Salina, MD;  Location: [place];  Service: Open Heart Surgery;  Laterality: N/A;   TONSILLECTOMY      Short Social History:  Social History   Tobacco Use   Smoking status: Former    Packs/day: 1.25    Years: 35.00    Total pack years: 43.75    Types: Cigarettes    Quit date: 02/28/2005    Years since quitting: 16.8   Smokeless tobacco: Current    Types: Snuff   Tobacco comments:    occaisionally  Substance Use Topics   Alcohol use: Yes    Alcohol/week: 8.0 - 10.0 standard drinks of alcohol    Types: 8 - 10 Glasses of wine per week    Comment: weekly    Allergies  Allergen Reactions   Brilinta [Ticagrelor] Shortness Of Breath   Chlorhexidine Gluconate Other (See Comments)    Skin burning for hours afterward   Contrast Media [Iodinated Contrast Media] Itching    Face and head flushing, nose itching after contrast administation for angiogram   Statins Other (See Comments)    Failed Crestor 5 mg twice weekly, Crestor 20 mg daily, Pravastatin 40 mg qd, Lipitor, Zocor - muscle aches   Zetia [Ezetimibe] Other (See Comments)    Muscle aches    Current Outpatient Medications  Medication Sig Dispense Refill   aspirin EC 81 MG tablet Take  1 tablet (81 mg total) by mouth daily. 150 tablet 2   carvedilol (COREG) 12.5 MG tablet TAKE (1) TABLET BY MOUTH TWICE DAILY 180 tablet 1   colchicine 0.6 MG tablet TAKE 1 TABLET BY MOUTH 2 TIMES DAILY. 30 tablet 0   ELIQUIS 5 MG TABS tablet TAKE 1 TABLET BY MOUTH TWICE A DAY 180 tablet 5   mexiletine (MEXITIL) 150 MG capsule Take 1 capsule (150 mg total) by mouth 2 (two) times daily. 180 capsule 3   nitroGLYCERIN (NITROSTAT) 0.4 MG SL tablet  Place 1 tablet (0.4 mg total) under the tongue every 5 (five) minutes as needed for chest pain. 25 tablet 2   PRALUENT 150 MG/ML SOAJ Inject 150 mg into the skin every 14 (fourteen) days. 6 mL 3   sacubitril-valsartan (ENTRESTO) 49-51 MG Take 1 tablet by mouth 2 (two) times daily. 180 tablet 3   traZODone (DESYREL) 50 MG tablet Take 1 tablet (50 mg total) by mouth at bedtime as needed. for sleep 90 tablet 1   No current facility-administered medications for this visit.    Review of Systems  Constitutional:  Constitutional negative. HENT: HENT negative.  Respiratory: Respiratory negative.  Cardiovascular: Positive for claudication.  GI: Gastrointestinal negative.  Musculoskeletal: Positive for leg pain.  Skin: Skin negative.  Neurological: Positive for numbness.  Hematologic: Hematologic/lymphatic negative.  Psychiatric: Psychiatric negative.        Objective:  Objective   Vitals:   01/12/22 1401  BP: 132/86  Pulse: 74  Resp: 20  Temp: 98.5 F (36.9 C)  SpO2: 93%  Weight: 177 lb (80.3 kg)  Height: '5\' 6"'$  (1.676 m)   Body mass index is 28.57 kg/m.  Physical Exam Constitutional:      Appearance: He is normal weight.  HENT:     Head: Normocephalic.     Nose: Nose normal.  Eyes:     Pupils: Pupils are equal, round, and reactive to light.  Cardiovascular:     Rate and Rhythm: Normal rate.     Pulses:          Femoral pulses are 2+ on the right side and 2+ on the left side.      Dorsalis pedis pulses are 2+ on the right side.        Posterior tibial pulses are 2+ on the right side.  Pulmonary:     Effort: Pulmonary effort is normal.  Abdominal:     General: Abdomen is flat.     Palpations: Abdomen is soft.  Neurological:     Mental Status: He is alert.     Data: ABI Findings:  +---------+------------------+-----+---------+--------+  Right   Rt Pressure (mmHg)IndexWaveform Comment   +---------+------------------+-----+---------+--------+  Brachial 167                                       +---------+------------------+-----+---------+--------+  PTA     164               0.94 biphasic           +---------+------------------+-----+---------+--------+  DP      158               0.91 triphasic          +---------+------------------+-----+---------+--------+  Great Toe155               0.89                    +---------+------------------+-----+---------+--------+   +---------+------------------+-----+----------+-------+  Left    Lt Pressure (mmHg)IndexWaveform  Comment  +---------+------------------+-----+----------+-------+  Brachial 174                                       +---------+------------------+-----+----------+-------+  PTA     84                0.48 monophasic         +---------+------------------+-----+----------+-------+  DP      65                0.37 monophasic         +---------+------------------+-----+----------+-------+  Great Toe0                 0.00                    +---------+------------------+-----+----------+-------+   +-------+-----------+-----------+------------+------------+  ABI/TBIToday's ABIToday's TBIPrevious ABIPrevious TBI  +-------+-----------+-----------+------------+------------+  Right 0.94       0.89       1.00        0.54          +-------+-----------+-----------+------------+------------+  Left  0.48       0.00       0.98        0.56           +-------+-----------+-----------+------------+------------+       Bilateral ABIs appear decreased compared to prior study on 10/05/21.    Summary:  Right: Resting right ankle-brachial index indicates mild right lower  extremity arterial disease. The right toe-brachial index is abnormal.   Left: Resting left ankle-brachial index indicates severe left lower  extremity arterial disease. Absent left great toe PPG waveform.       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  I have recommended bypass surgery but will need updated angiogram prior.  We will plan for angiography on November 27 followed by admission to the hospital and bypass graft from adequate native SFA to healthy below-knee popliteal artery and possibly will need embolectomy at the time.  We will need to hold anticoagulation for 48 hours prior to procedure.  With any luck we can bypass SFA to popliteal to avoid reexposure of the left common femoral artery where he has had previous endarterectomy and hopefully he will have saphenous vein for bypass as well though it appears he does have saphenous vein below the knee that would be adequate for bypass.  We discussed the risk and benefits particularly to his heart as well as healing issues and he demonstrates good understanding we will have him scheduled after Thanksgiving.     Waynetta Sandy MD [facility]

## 2022-01-12 NOTE — Telephone Encounter (Signed)
PAF completed and placed in providers box to review and sign

## 2022-01-14 NOTE — Telephone Encounter (Signed)
PAF faxed to [employer] for review.

## 2022-01-19 ENCOUNTER — Encounter: Payer: Self-pay | Admitting: Cardiology

## 2022-01-19 NOTE — Progress Notes (Signed)
PERIOPERATIVE PRESCRIPTION FOR IMPLANTED CARDIAC DEVICE PROGRAMMING  Patient Information: Name:  [name]  DOB:  02/22/1951  MRN:  [id number]    Planned Procedure:  LEFT BYPASS GRAFT FEMORAL-FEMORAL ARTERY - Left  Surgeon:  Dr Servando Snare  Date of Procedure:  01/25/22  Cautery will be used.  Position during surgery:  Supine  Device Information:  Clinic EP Physician:  Lars Mage, MD  Device Type:  Defibrillator Manufacturer and Phone #:  [employer] Jude[employer]ott: [phone] Pacemaker Dependent?:  No. Date of Last Device Check:  01/02/22 Normal Device Function?:  Yes.    Electrophysiologist's Recommendations:  Have magnet available. Provide continuous ECG monitoring when magnet is used or reprogramming is to be performed.  Procedure will likely interfere with device function.  Device should be programmed:  Tachy therapies disabled  Per Device Clinic Standing Orders, Diamond Nickel, RN  11:11 AM 01/19/2022

## 2022-01-21 ENCOUNTER — Encounter (HOSPITAL_COMMUNITY): Payer: Self-pay | Admitting: Vascular Surgery

## 2022-01-21 NOTE — Anesthesia Preprocedure Evaluation (Addendum)
Anesthesia Evaluation  Patient identified by MRN, date of birth, ID band Patient awake    Reviewed: Allergy & Precautions, H&P , NPO status , Patient's Chart, lab work & pertinent test results  Airway Mallampati: II  TM Distance: >3 FB Neck ROM: Full    Dental no notable dental hx.    Pulmonary neg pulmonary ROS, former smoker   Pulmonary exam normal breath sounds clear to auscultation       Cardiovascular hypertension, + CAD, + Past MI, + CABG, + Peripheral Vascular Disease and +CHF  Normal cardiovascular exam+ dysrhythmias Atrial Fibrillation (-) pacemaker+ Cardiac Defibrillator  Rhythm:Regular Rate:Normal     Neuro/Psych negative neurological ROS  negative psych ROS   GI/Hepatic Neg liver ROS, hiatal hernia,GERD  ,,  Endo/Other  negative endocrine ROS    Renal/GU negative Renal ROS  negative genitourinary   Musculoskeletal negative musculoskeletal ROS (+)    Abdominal   Peds negative pediatric ROS (+)  Hematology negative hematology ROS (+)   Anesthesia Other Findings   Reproductive/Obstetrics negative OB ROS                             Anesthesia Physical Anesthesia Plan  ASA: 4  Anesthesia Plan: General   Post-op Pain Management: Ofirmev IV (intra-op)*   Induction: Intravenous  PONV Risk Score and Plan: 2 and Ondansetron, Dexamethasone and Treatment may vary due to age or medical condition  Airway Management Planned: Oral ETT  Additional Equipment: Arterial line  Intra-op Plan:   Post-operative Plan: Extubation in OR  Informed Consent: I have reviewed the patients History and Physical, chart, labs and discussed the procedure including the risks, benefits and alternatives for the proposed anesthesia with the patient or authorized representative who has indicated his/her understanding and acceptance.     Dental advisory given  Plan Discussed with: CRNA and  Surgeon  Anesthesia Plan Comments: (PAT note written 01/21/2022 by Myra Gianotti, PA-C.  )       Anesthesia Quick Evaluation

## 2022-01-21 NOTE — Progress Notes (Signed)
PCP - Dr Halina Maidens Cardiologist - Dr Harrell Gave End EP - Dr Lars Mage  Chest x-ray - 10/07/20 (1V) EKG - 12/06/21 Stress Test - 11/25/17 ECHO - 08/10/20 Cardiac Cath - 11/05/19  ICD Pacemaker - St Jude/[employer] Rep contacted.  [employer] is on vacation today.  Spoke with [place].  Surgery date, time and procedure was given to [place].  Last remote device check was on  01/04/22.  Sleep Study -  n/a CPAP - none  Blood Thinner Instructions:  Follow your surgeon's instructions on when to stop Eliquis prior to surgery.  Last dose will be on 01/22/22 per MD.   Anesthesia review: Yes  STOP now taking any Aspirin (unless otherwise instructed by your surgeon), Aleve, Naproxen, Ibuprofen, Motrin, Advil, Goody's, BC's, all herbal medications, fish oil, and all vitamins.   Coronavirus Screening Do you have any of the following symptoms:  Cough yes/no: No Fever (>100.35F)  yes/no: No Runny nose yes/no: No Sore throat yes/no: No Difficulty breathing/shortness of breath  yes/no: No  Have you traveled in the last 14 days and where? yes/no: No  Patient verbalized understanding of instructions that were given via phone.

## 2022-01-21 NOTE — Progress Notes (Signed)
Anesthesia Chart Review: [name]  Case: [id number] Date/Time: 01/25/22 1415   Procedure: LEFT BYPASS GRAFT FEMORAL-FEMORAL ARTERY (Left)   Anesthesia type: Choice   Pre-op diagnosis: Peripheral vascular disease   Location: [facility] OR ROOM 11 / [place] [facility]   Surgeons: Waynetta Sandy, MD       DISCUSSION: Patient is a 71 year old male scheduled for the above procedure. He has had bilateral femoral endarterectomies and multiple PTA/stents to BLE arteries with repeat occlusions requiring thrombolysis/mechanical thrombectomies by [name], MD.  History includes former smoker (quit 02/28/05), HTN, HLD, CAD/MI (DES CX 08/07/15; CABG: LIMA-DIAG, SVG-OM1, SVG-OM2, SVG-PDA & placement of LVAD 11/11/19; removal of LVAD 11/15/19; post-op PAF), chronic HFrEF, cardiomyopathy (mixed ischemic/non-ischemic), PVCs, ICD (St. Jude CRT-D 03/23/20), left BBB, carotid artery disease (s/p right CEA 01/05/06), PAD (PTCA/stent right EIA, PTA left profunda femoris and CFA 02/12/18; PTA left dSFA, left popliteal artery stent 03/07/18; thrombolysis/mechanical thrombectomy left SFT/popliteal/tibial arteries 06/04/18 & 09/28/20, PTA left tibioperoneal trunk/PT, left SFA & popliteal stents 06/04/18, 09/17/20, 09/28/20; bilateral femoral endarterectomies 09/30/20; PTA left profunda femoris, thrombectomy left SFA/popliteal/tibial arteries, PTA left PT, stent left SFA 02/15/21; thrombolysis/thrombectomy left SFA/popliteal/tibial arteries, PTCA left PT and left SFA 12/27/21 with thrombectomy 12/28/21 and redo PTA and left SFA stent 12/28/21), GERD, hiatal hernia, spinal surgery (left L4-5 laminotomy/microdiscectomy 09/29/03). S/p EGD with esophogeal dilation on 12/23/21.   Last visit with EP Dr. Quentin Ore was on 12/22/21. He felt poorly on amiodarone, so changed to mexiletine for PVC suppression. May start sotalol in the future. Most recent general cardiology visit was on 12/06/21 with Gerrie Nordmann, NP for follow-up and clearance for future endoscopy.  Denied CV symptoms. Functional status 5.93 METS. No further CV testing recommended at that time.    EP Perioperative ICD recommendations: Device Information:  Clinic EP Physician:  Lars Mage, MD   Device Type:  Defibrillator Manufacturer and Phone #:  St. Jude/Abbott: [phone] Pacemaker Dependent?:  No. Date of Last Device Check:  01/02/22           Normal Device Function?:  Yes.     Electrophysiologist's Recommendations:  Have magnet available. Provide continuous ECG monitoring when magnet is used or reprogramming is to be performed.  Procedure will likely interfere with device function.  Device should be programmed:  Tachy therapies disabled  He is a same day work-up. Above procedure recommended at 01/12/22 consultation visit with Dr. Donzetta Matters for chronic LLE limb threatening ischemia with likely occluded left SFA and popliteal stents. Plan is for "angiography on November 27 followed by admission to the hospital and bypass graft from adequate native SFA to healthy below-knee popliteal artery and possibly will need embolectomy at the time.  We will need to hold anticoagulation for 48 hours prior to procedure.  With any luck we can bypass SFA to popliteal to avoid reexposure of the left common femoral artery where he has had previous endarterectomy and hopefully he will have saphenous vein for bypass as well though it appears he does have saphenous vein below the knee that would be adequate for bypass." He reported instructions for last Eliquis 01/22/22.   Anesthesia team to evaluate on the day of surgery.    VS: Ht '5\' 6"'$  (1.676 m)   Wt 80.3 kg   BMI 28.57 kg/m  BP Readings from Last 3 Encounters:  01/12/22 132/86  12/29/21 (!) 109/90  12/23/21 118/87   Pulse Readings from Last 3 Encounters:  01/12/22 74  12/29/21 71  12/23/21 73  PROVIDERS: Glean Hess, MD is PCP  Lars Mage, MD is EP cardiologist End, Harrell Gave, MD is primary cardiologist Loralie Champagne, MD is HF cardiologist  [name], MD is GI   LABS: Most recent results in [facility] include: Lab Results  Component Value Date   WBC 3.4 (L) 12/28/2021   HGB 14.6 12/28/2021   HCT 44.5 12/28/2021   PLT 152 12/28/2021   GLUCOSE 118 (H) 12/06/2021   ALT 50 (H) 12/06/2021   AST 43 (H) 12/06/2021   NA 138 12/06/2021   K 4.4 12/06/2021   CL 107 12/06/2021   CREATININE 0.99 12/27/2021   BUN 36 (H) 12/27/2021   CO2 25 12/06/2021   TSH 3.217 12/06/2021    EKG: 12/06/21: Atrial sensed, ventricular paced rhythm with occasional PVCs   CV: US Carotid 04/06/21: Summary:  - Right Carotid: Velocities in the right ICA are consistent with a 1-39% stenosis. Non-hemodynamically significant plaque <50% noted in the CCA. The ECA appears <50% stenosed.  - Left Carotid: Velocities in the left ICA are consistent with a 40-59% stenosis. Non-hemodynamically significant plaque <50% noted in the CCA. The ECA appears >50% stenosed.  - Vertebrals:  Bilateral vertebral arteries demonstrate antegrade flow. Subclavians: Normal flow hemodynamics were seen in bilateral subclavian arteries.    Echo 08/10/20: IMPRESSIONS   1. Left ventricular ejection fraction, by estimation, is 50 to 55%. The  left ventricle has low normal function. The left ventricle has no regional  wall motion abnormalities. The left ventricular internal cavity size was  mildly dilated. Left ventricular  diastolic parameters are indeterminate. The average left ventricular  global longitudinal strain is -14.0 %. The global longitudinal strain is  abnormal.   2. Right ventricular systolic function is normal. The right ventricular  size is normal. Tricuspid regurgitation signal is inadequate for assessing  PA pressure.   3. Left atrial size was mildly dilated.   4. The mitral valve is normal in structure. Mild mitral valve  regurgitation. No evidence of mitral stenosis.   5. The aortic valve is normal in structure. Aortic valve  regurgitation is  trivial. Mild aortic valve sclerosis is present, with no evidence of  aortic valve stenosis.   Last cardiac cath was on 11/05/19 prior to his CABG.    Past Medical History:  Diagnosis Date   Abnormal nuclear cardiac imaging test 08/08/2015   AICD (automatic cardioverter/defibrillator) present    pacemaker/defib St Jude/Abbott   Arthritis    fingers   Carotid artery occlusion    CHF (congestive heart failure) (HCC)    Coronary atherosclerosis of native coronary artery 01/29/2013   11/05/19 R/LHC 80% dLMCA stenosis small diffusely dz dLAD, chronically occluded OM1 50%mRCA lesion, widely patent mLCx strent, moderately elevated L heart filling pressures, mild to moderate RH filling pressures, normal to moderately reduced CO   Duodenal erosion    Encounter for screening for lung cancer 07/13/2016   Esophageal stenosis    esophageal dilation   GERD (gastroesophageal reflux disease)    H. pylori infection    Heart attack ([place]) 11/2007   Mild   Hiatal hernia    Hyperlipidemia    Hypertension    Old myocardial infarction 11/29/2007   Mildly elevated troponin, isolated value in October 2009. Cardiac catheterization-nonobstructive 60% RCA disease-subsequent nuclear stress test-9 minutes, low risk, mild inferior wall hypokinesis    Pain in limb 12/19/2017   Peripheral vascular disease ([place])    Unstable angina ([place]) 11/25/2017    Past Surgical History:  Procedure Laterality Date   APPENDECTOMY     BACK SURGERY     BIV ICD INSERTION CRT-D N/A 03/23/2020   Procedure: BIV ICD INSERTION CRT-D;  Surgeon: Vickie Epley, MD;  Location: [place] [facility];  Service: Cardiovascular;  Laterality: N/A;   CARDIAC CATHETERIZATION N/A 08/07/2015   Procedure: Left Heart Cath and Coronary Angiography;  Surgeon: [name], MD;  Location: [place] [facility];  Service: Cardiovascular;  Laterality: N/A;   CARDIAC CATHETERIZATION N/A 08/07/2015   Procedure: Coronary Stent Intervention;   Surgeon: [name], MD;  Location: [place] [facility];  Service: Cardiovascular;  Laterality: N/A;   CARDIAC CATHETERIZATION N/A 08/07/2015   Procedure: Coronary Stent Intervention;  Surgeon: [name], MD;  Location: [place] [facility];  Service: Cardiovascular;  Laterality: N/A;   CAROTID ENDARTERECTOMY  01/05/2006   Right  CEA with DPA   CATARACT EXTRACTION W/ INTRAOCULAR LENS IMPLANT Left 12/04/2017   CATARACT EXTRACTION W/PHACO Left 12/04/2017   Procedure: CATARACT EXTRACTION PHACO AND INTRAOCULAR LENS PLACEMENT ([place]) LEFT;  Surgeon: Eulogio Bear, MD;  Location: [place];  Service: Ophthalmology;  Laterality: Left;   CATARACT EXTRACTION W/PHACO Right 02/06/2018   Procedure: CATARACT EXTRACTION PHACO AND INTRAOCULAR LENS PLACEMENT (I[facility])RIGHT;  Surgeon: Eulogio Bear, MD;  Location: [place];  Service: Ophthalmology;  Laterality: Right;   COLONOSCOPY  05/20/2008   COLONOSCOPY WITH PROPOFOL N/A 09/13/2018   Procedure: COLONOSCOPY WITH BIOPSY;  Surgeon: [name], MD;  Location: [place];  Service: Endoscopy;  Laterality: N/A;   CORONARY ARTERY BYPASS GRAFT N/A 11/11/2019   Procedure: CORONARY ARTERY BYPASS GRAFTING (CABG) USING LIMA to Diag1; ENDOSCOPICALLY HARVESTED RIGHT GREATER SAPHENOUS VEIN: SVG to OM1; SVG to OM2; SVG to PDA.;  Surgeon: Ivin Poot, MD;  Location: [place];  Service: Open Heart Surgery;  Laterality: N/A;   CORONARY STENT PLACEMENT  08/07/2015   MID CIRCUMFLEX   ENDARTERECTOMY FEMORAL Bilateral 09/30/2020   Procedure: ENDARTERECTOMY FEMORAL;  Surgeon: Algernon Huxley, MD;  Location: [facility];  Service: Vascular;  Laterality: Bilateral;   ENDOVEIN HARVEST OF GREATER SAPHENOUS VEIN Right 11/11/2019   Procedure: ENDOVEIN HARVEST OF GREATER SAPHENOUS VEIN;  Surgeon: Ivin Poot, MD;  Location: [place];  Service: Open Heart Surgery;  Laterality: Right;   ESOPHAGOGASTRODUODENOSCOPY (EGD) WITH PROPOFOL N/A 02/11/2019    Procedure: ESOPHAGOGASTRODUODENOSCOPY (EGD) WITH BIOPSY and  Dilation;  Surgeon: [name], MD;  Location: [place];  Service: Endoscopy;  Laterality: N/A;   ESOPHAGOGASTRODUODENOSCOPY (EGD) WITH PROPOFOL N/A 12/23/2021   Procedure: ESOPHAGOGASTRODUODENOSCOPY (EGD) WITH PROPOFOL;  Surgeon: [name], MD;  Location: [facility];  Service: Endoscopy;  Laterality: N/A;   HIP SURGERY Left 10/2016   left hip tendon repair   LEFT HEART CATH AND CORONARY ANGIOGRAPHY N/A 11/27/2017   Procedure: LEFT HEART CATH AND CORONARY ANGIOGRAPHY;  Surgeon: Wellington Hampshire, MD;  Location: [place] [facility];  Service: Cardiovascular;  Laterality: N/A;   LOWER EXTREMITY ANGIOGRAPHY Left 02/12/2018   Procedure: LOWER EXTREMITY ANGIOGRAPHY;  Surgeon: Algernon Huxley, MD;  Location: [place] [facility];  Service: Cardiovascular;  Laterality: Left;   LOWER EXTREMITY ANGIOGRAPHY Left 03/07/2018   Procedure: LOWER EXTREMITY ANGIOGRAPHY;  Surgeon: Algernon Huxley, MD;  Location: [place] [facility];  Service: Cardiovascular;  Laterality: Left;   LOWER EXTREMITY ANGIOGRAPHY Left 06/04/2018   Procedure: LOWER EXTREMITY ANGIOGRAPHY;  Surgeon: Algernon Huxley, MD;  Location: [place] [facility];  Service:  Cardiovascular;  Laterality: Left;   LOWER EXTREMITY ANGIOGRAPHY Left 09/17/2020   Procedure: LOWER EXTREMITY ANGIOGRAPHY;  Surgeon: Algernon Huxley, MD;  Location: [place] [facility];  Service: Cardiovascular;  Laterality: Left;   LOWER EXTREMITY ANGIOGRAPHY Left 09/28/2020   Procedure: LOWER EXTREMITY ANGIOGRAPHY;  Surgeon: Algernon Huxley, MD;  Location: [place] [facility];  Service: Cardiovascular;  Laterality: Left;   LOWER EXTREMITY ANGIOGRAPHY N/A 02/15/2021   Procedure: LOWER EXTREMITY ANGIOGRAPHY;  Surgeon: Algernon Huxley, MD;  Location: [place] [facility];  Service: Cardiovascular;  Laterality: N/A;   LOWER EXTREMITY ANGIOGRAPHY Left 12/27/2021   Procedure: Lower Extremity Angiography;  Surgeon: Algernon Huxley,  MD;  Location: [place] [facility];  Service: Cardiovascular;  Laterality: Left;   LOWER EXTREMITY ANGIOGRAPHY Left 12/28/2021   Procedure: Lower Extremity Angiography;  Surgeon: Algernon Huxley, MD;  Location: [place] [facility];  Service: Cardiovascular;  Laterality: Left;   PLACEMENT OF IMPELLA LEFT VENTRICULAR ASSIST DEVICE N/A 11/11/2019   Procedure: PLACEMENT OF IMPELLA LEFT VENTRICULAR ASSIST DEVICE 5.5;  Surgeon: Ivin Poot, MD;  Location: [place];  Service: Open Heart Surgery;  Laterality: N/A;  Midline Sternotomy   POLYPECTOMY N/A 09/13/2018   Procedure: POLYPECTOMY;  Surgeon: [name], MD;  Location: [place];  Service: Endoscopy;  Laterality: N/A;   POLYPECTOMY N/A 02/11/2019   Procedure: POLYPECTOMY;  Surgeon: [name], MD;  Location: [place];  Service: Endoscopy;  Laterality: N/A;   REMOVAL OF IMPELLA LEFT VENTRICULAR ASSIST DEVICE N/A 11/15/2019   Procedure: REMOVAL OF IMPELLA 5.5 LEFT VENTRICULAR ASSIST DEVICE;  Surgeon: Ivin Poot, MD;  Location: [place];  Service: Open Heart Surgery;  Laterality: N/A;   RIGHT/LEFT HEART CATH AND CORONARY ANGIOGRAPHY N/A 11/05/2019   Procedure: RIGHT/LEFT HEART CATH AND CORONARY ANGIOGRAPHY;  Surgeon: Nelva Bush, MD;  Location: [place] [facility];  Service: Cardiovascular;  Laterality: N/A;   SPINE SURGERY     TEE WITHOUT CARDIOVERSION N/A 11/11/2019   Procedure: TRANSESOPHAGEAL ECHOCARDIOGRAM (TEE);  Surgeon: Prescott Gum, Collier Salina, MD;  Location: [place];  Service: Open Heart Surgery;  Laterality: N/A;   TEE WITHOUT CARDIOVERSION N/A 11/15/2019   Procedure: TRANSESOPHAGEAL ECHOCARDIOGRAM (TEE);  Surgeon: Prescott Gum, Collier Salina, MD;  Location: [place];  Service: Open Heart Surgery;  Laterality: N/A;   TONSILLECTOMY      MEDICATIONS: No current facility-administered medications for this encounter.    aspirin EC 81 MG tablet   carvedilol (COREG) 12.5 MG tablet   colchicine 0.6 MG tablet   ELIQUIS 5 MG TABS tablet    mexiletine (MEXITIL) 150 MG capsule   nitroGLYCERIN (NITROSTAT) 0.4 MG SL tablet   PRALUENT 150 MG/ML SOAJ   sacubitril-valsartan (ENTRESTO) 49-51 MG   traZODone (DESYREL) 50 MG tablet    Myra Gianotti, PA-C Surgical Short Stay/Anesthesiology [facility] Phone [phone] [facility] Phone [phone] 01/21/2022 4:09 PM

## 2022-01-24 ENCOUNTER — Inpatient Hospital Stay (HOSPITAL_COMMUNITY): Payer: PPO

## 2022-01-24 ENCOUNTER — Other Ambulatory Visit: Payer: Self-pay

## 2022-01-24 ENCOUNTER — Encounter (HOSPITAL_COMMUNITY): Payer: Self-pay | Admitting: Vascular Surgery

## 2022-01-24 ENCOUNTER — Inpatient Hospital Stay (HOSPITAL_COMMUNITY)
Admission: AD | Admit: 2022-01-24 | Discharge: 2022-01-29 | DRG: 253 | Disposition: A | Discharge status: Home Health | Payer: PPO | Attending: Vascular Surgery | Admitting: Vascular Surgery

## 2022-01-24 ENCOUNTER — Encounter (HOSPITAL_COMMUNITY)
Admission: AD | Disposition: A | Discharge status: Home Health | Payer: Self-pay | Source: Home / Self Care | Attending: Vascular Surgery

## 2022-01-24 DIAGNOSIS — Z87891 Personal history of nicotine dependence: Secondary | ICD-10-CM

## 2022-01-24 DIAGNOSIS — Z95810 Presence of automatic (implantable) cardiac defibrillator: Secondary | ICD-10-CM | POA: Diagnosis not present

## 2022-01-24 DIAGNOSIS — T82868A Thrombosis of vascular prosthetic devices, implants and grafts, initial encounter: Secondary | ICD-10-CM | POA: Diagnosis not present

## 2022-01-24 DIAGNOSIS — Z01810 Encounter for preprocedural cardiovascular examination: Secondary | ICD-10-CM

## 2022-01-24 DIAGNOSIS — I509 Heart failure, unspecified: Secondary | ICD-10-CM | POA: Diagnosis not present

## 2022-01-24 DIAGNOSIS — I700 Atherosclerosis of aorta: Secondary | ICD-10-CM | POA: Diagnosis not present

## 2022-01-24 DIAGNOSIS — Z8711 Personal history of peptic ulcer disease: Secondary | ICD-10-CM | POA: Diagnosis not present

## 2022-01-24 DIAGNOSIS — I959 Hypotension, unspecified: Secondary | ICD-10-CM | POA: Diagnosis not present

## 2022-01-24 DIAGNOSIS — Z8249 Family history of ischemic heart disease and other diseases of the circulatory system: Secondary | ICD-10-CM

## 2022-01-24 DIAGNOSIS — Y838 Other surgical procedures as the cause of abnormal reaction of the patient, or of later complication, without mention of misadventure at the time of the procedure: Secondary | ICD-10-CM | POA: Diagnosis present

## 2022-01-24 DIAGNOSIS — L97229 Non-pressure chronic ulcer of left calf with unspecified severity: Secondary | ICD-10-CM | POA: Diagnosis present

## 2022-01-24 DIAGNOSIS — I10 Essential (primary) hypertension: Secondary | ICD-10-CM | POA: Diagnosis not present

## 2022-01-24 DIAGNOSIS — K219 Gastro-esophageal reflux disease without esophagitis: Secondary | ICD-10-CM | POA: Diagnosis present

## 2022-01-24 DIAGNOSIS — I252 Old myocardial infarction: Secondary | ICD-10-CM | POA: Diagnosis not present

## 2022-01-24 DIAGNOSIS — I739 Peripheral vascular disease, unspecified: Principal | ICD-10-CM

## 2022-01-24 DIAGNOSIS — Z7982 Long term (current) use of aspirin: Secondary | ICD-10-CM

## 2022-01-24 DIAGNOSIS — I2510 Atherosclerotic heart disease of native coronary artery without angina pectoris: Secondary | ICD-10-CM | POA: Diagnosis not present

## 2022-01-24 DIAGNOSIS — I77819 Aortic ectasia, unspecified site: Secondary | ICD-10-CM | POA: Diagnosis not present

## 2022-01-24 DIAGNOSIS — E785 Hyperlipidemia, unspecified: Secondary | ICD-10-CM | POA: Diagnosis not present

## 2022-01-24 DIAGNOSIS — I110 Hypertensive heart disease with heart failure: Secondary | ICD-10-CM | POA: Diagnosis not present

## 2022-01-24 DIAGNOSIS — Z7901 Long term (current) use of anticoagulants: Secondary | ICD-10-CM

## 2022-01-24 DIAGNOSIS — M109 Gout, unspecified: Secondary | ICD-10-CM | POA: Diagnosis not present

## 2022-01-24 DIAGNOSIS — Z951 Presence of aortocoronary bypass graft: Secondary | ICD-10-CM

## 2022-01-24 DIAGNOSIS — Z9889 Other specified postprocedural states: Secondary | ICD-10-CM

## 2022-01-24 DIAGNOSIS — Z823 Family history of stroke: Secondary | ICD-10-CM | POA: Diagnosis not present

## 2022-01-24 DIAGNOSIS — Z833 Family history of diabetes mellitus: Secondary | ICD-10-CM | POA: Diagnosis not present

## 2022-01-24 DIAGNOSIS — I70222 Atherosclerosis of native arteries of extremities with rest pain, left leg: Secondary | ICD-10-CM | POA: Diagnosis present

## 2022-01-24 DIAGNOSIS — Z809 Family history of malignant neoplasm, unspecified: Secondary | ICD-10-CM | POA: Diagnosis not present

## 2022-01-24 DIAGNOSIS — I998 Other disorder of circulatory system: Secondary | ICD-10-CM

## 2022-01-24 HISTORY — PX: ABDOMINAL AORTOGRAM W/LOWER EXTREMITY: CATH118223

## 2022-01-24 LAB — POCT I-STAT, CHEM 8
BUN: 25 mg/dL — ABNORMAL HIGH (ref 8–23)
Calcium, Ion: 1.22 mmol/L (ref 1.15–1.40)
Chloride: 102 mmol/L (ref 98–111)
Creatinine, Ser: 1.1 mg/dL (ref 0.61–1.24)
Glucose, Bld: 100 mg/dL — ABNORMAL HIGH (ref 70–99)
HCT: 40 % (ref 39.0–52.0)
Hemoglobin: 13.6 g/dL (ref 13.0–17.0)
Potassium: 4.6 mmol/L (ref 3.5–5.1)
Sodium: 140 mmol/L (ref 135–145)
TCO2: 27 mmol/L (ref 22–32)

## 2022-01-24 LAB — CBC
HCT: 41.2 % (ref 39.0–52.0)
Hemoglobin: 14 g/dL (ref 13.0–17.0)
MCH: 33.3 pg (ref 26.0–34.0)
MCHC: 34 g/dL (ref 30.0–36.0)
MCV: 97.9 fL (ref 80.0–100.0)
Platelets: 120 10*3/uL — ABNORMAL LOW (ref 150–400)
RBC: 4.21 MIL/uL — ABNORMAL LOW (ref 4.22–5.81)
RDW: 13.4 % (ref 11.5–15.5)
WBC: 2.5 10*3/uL — ABNORMAL LOW (ref 4.0–10.5)
nRBC: 0 % (ref 0.0–0.2)

## 2022-01-24 LAB — CREATININE, SERUM
Creatinine, Ser: 0.94 mg/dL (ref 0.61–1.24)
GFR, Estimated: >60 mL/min (ref >60)

## 2022-01-24 SURGERY — ABDOMINAL AORTOGRAM W/LOWER EXTREMITY
Anesthesia: LOCAL | Laterality: Left

## 2022-01-24 MED ORDER — TRAZODONE HCL 50 MG PO TABS
50.0000 mg | ORAL_TABLET | Freq: Every evening | ORAL | Status: DC | PRN
  Administered 2022-01-24 – 2022-01-26 (×3): 50 mg via ORAL
  Filled 2022-01-24 (×3): qty 1

## 2022-01-24 MED ORDER — MORPHINE SULFATE (PF) 2 MG/ML IV SOLN: 2.0000 mg | INTRAVENOUS | Status: DC | PRN

## 2022-01-24 MED ORDER — HEPARIN (PORCINE) IN NACL 1000-0.9 UT/500ML-% IV SOLN
INTRAVENOUS | Status: DC | PRN
  Administered 2022-01-24 (×2): 500 mL

## 2022-01-24 MED ORDER — MIDAZOLAM HCL 2 MG/2ML IJ SOLN
INTRAMUSCULAR | Status: DC | PRN
  Administered 2022-01-24: 2 mg via INTRAVENOUS

## 2022-01-24 MED ORDER — ACETAMINOPHEN 325 MG PO TABS: 650.0000 mg | ORAL_TABLET | ORAL | Status: DC | PRN

## 2022-01-24 MED ORDER — FENTANYL CITRATE (PF) 100 MCG/2ML IJ SOLN
INTRAMUSCULAR | Status: AC
  Filled 2022-01-24: qty 2

## 2022-01-24 MED ORDER — DIPHENHYDRAMINE HCL 50 MG/ML IJ SOLN
INTRAMUSCULAR | Status: AC
  Filled 2022-01-24: qty 1

## 2022-01-24 MED ORDER — SACUBITRIL-VALSARTAN 49-51 MG PO TABS
1.0000 | ORAL_TABLET | Freq: Two times a day (BID) | ORAL | Status: DC
  Administered 2022-01-25 – 2022-01-29 (×8): 1 via ORAL
  Filled 2022-01-24 (×9): qty 1

## 2022-01-24 MED ORDER — HYDRALAZINE HCL 20 MG/ML IJ SOLN: 5.0000 mg | INTRAMUSCULAR | Status: DC | PRN

## 2022-01-24 MED ORDER — ASPIRIN 81 MG PO TBEC
81.0000 mg | DELAYED_RELEASE_TABLET | Freq: Every day | ORAL | Status: DC
  Administered 2022-01-24 – 2022-01-29 (×5): 81 mg via ORAL
  Filled 2022-01-24 (×5): qty 1

## 2022-01-24 MED ORDER — LABETALOL HCL 5 MG/ML IV SOLN: 10.0000 mg | INTRAVENOUS | Status: DC | PRN

## 2022-01-24 MED ORDER — METHYLPREDNISOLONE SODIUM SUCC 125 MG IJ SOLR
125.0000 mg | INTRAMUSCULAR | Status: AC
  Administered 2022-01-24: 125 mg via INTRAVENOUS
  Filled 2022-01-24: qty 2

## 2022-01-24 MED ORDER — HEPARIN SODIUM (PORCINE) 5000 UNIT/ML IJ SOLN
5000.0000 [IU] | Freq: Three times a day (TID) | INTRAMUSCULAR | Status: DC
  Administered 2022-01-24: 5000 [IU] via SUBCUTANEOUS
  Filled 2022-01-24: qty 1

## 2022-01-24 MED ORDER — MIDAZOLAM HCL 2 MG/2ML IJ SOLN
INTRAMUSCULAR | Status: AC
  Filled 2022-01-24: qty 2

## 2022-01-24 MED ORDER — FENTANYL CITRATE (PF) 100 MCG/2ML IJ SOLN
INTRAMUSCULAR | Status: DC | PRN
  Administered 2022-01-24: 25 ug via INTRAVENOUS

## 2022-01-24 MED ORDER — DIPHENHYDRAMINE HCL 50 MG/ML IJ SOLN
25.0000 mg | INTRAMUSCULAR | Status: AC
  Administered 2022-01-24: 25 mg via INTRAVENOUS
  Filled 2022-01-24: qty 1

## 2022-01-24 MED ORDER — HEPARIN (PORCINE) IN NACL 1000-0.9 UT/500ML-% IV SOLN
INTRAVENOUS | Status: AC
  Filled 2022-01-24: qty 1000

## 2022-01-24 MED ORDER — OXYCODONE HCL 5 MG PO TABS
5.0000 mg | ORAL_TABLET | ORAL | Status: DC | PRN
  Administered 2022-01-24 (×2): 5 mg via ORAL
  Filled 2022-01-24 (×2): qty 1

## 2022-01-24 MED ORDER — IODIXANOL 320 MG/ML IV SOLN
INTRAVENOUS | Status: DC | PRN
  Administered 2022-01-24: 60 mL

## 2022-01-24 MED ORDER — CARVEDILOL 12.5 MG PO TABS
12.5000 mg | ORAL_TABLET | Freq: Two times a day (BID) | ORAL | Status: DC
  Administered 2022-01-24 – 2022-01-26 (×3): 12.5 mg via ORAL
  Filled 2022-01-24 (×3): qty 1

## 2022-01-24 MED ORDER — LIDOCAINE HCL (PF) 1 % IJ SOLN
INTRAMUSCULAR | Status: DC | PRN
  Administered 2022-01-24: 10 mL

## 2022-01-24 MED ORDER — SODIUM CHLORIDE 0.9% FLUSH
3.0000 mL | Freq: Two times a day (BID) | INTRAVENOUS | Status: DC
  Administered 2022-01-24: 3 mL via INTRAVENOUS

## 2022-01-24 MED ORDER — ONDANSETRON HCL 4 MG/2ML IJ SOLN: 4.0000 mg | Freq: Four times a day (QID) | INTRAMUSCULAR | Status: DC | PRN

## 2022-01-24 MED ORDER — LIDOCAINE HCL (PF) 1 % IJ SOLN
INTRAMUSCULAR | Status: AC
  Filled 2022-01-24: qty 30

## 2022-01-24 MED ORDER — SODIUM CHLORIDE 0.9 % IV SOLN: 250.0000 mL | INTRAVENOUS | Status: DC | PRN

## 2022-01-24 MED ORDER — SODIUM CHLORIDE 0.9 % IV SOLN: INTRAVENOUS | Status: AC

## 2022-01-24 MED ORDER — SODIUM CHLORIDE 0.9% FLUSH: 3.0000 mL | INTRAVENOUS | Status: DC | PRN

## 2022-01-24 MED ORDER — SODIUM CHLORIDE 0.9 % IV SOLN: INTRAVENOUS | Status: DC

## 2022-01-24 SURGICAL SUPPLY — 9 items
CATH OMNI FLUSH 5F 65CM (CATHETERS) IMPLANT
COVER DOME SNAP 22 D (MISCELLANEOUS) IMPLANT
KIT MICROPUNCTURE NIT STIFF (SHEATH) IMPLANT
KIT PV (KITS) ×2 IMPLANT
SHEATH PINNACLE 5F 10CM (SHEATH) IMPLANT
SYR MEDRAD MARK V 150ML (SYRINGE) IMPLANT
TRANSDUCER W/STOPCOCK (MISCELLANEOUS) ×2 IMPLANT
TRAY PV CATH (CUSTOM PROCEDURE TRAY) ×2 IMPLANT
WIRE BENTSON .035X145CM (WIRE) IMPLANT

## 2022-01-24 NOTE — Op Note (Signed)
    Patient name: LASHUN MCCANTS MRN: [id number] DOB: 12/17/1950 Sex: male  01/24/2022 Pre-operative Diagnosis: Chronic left lower extremity limb threatening ischemia with rest pain Post-operative diagnosis:  Same Surgeon:  Eda Paschal. Donzetta Matters, MD Procedure Performed: 1.  Ultrasound-guided cannulation left common femoral artery 2.  Aortogram 3.  Lower extremity angiogram 4.  Moderate sedation with fentanyl and Versed for 20 minutes  Indications: 71 year old male with history of claudication has undergone multiple left lower extremity stents with occlusion and now rest pain and indicated for angiography to plan for bypass.  Findings: Aorta is ectatic with some calcification there is no evidence of flow limitation in either the aorta or the common or external iliac arteries.  The right lower extremity was not evaluated.  The left common femoral artery by ultrasound appeared to have a small dissection plane and appears that he has recurrent disease with disease at the takeoff of the profunda.  The SFA is initially patent and then occludes at the level of the first stent in the mid SFA and he reconstitutes a below the knee popliteal artery with runoff dominant via the posterior tibial but it does appear that the peroneal and anterior tibial arteries are also patent.  Plan is for left femoral to below-knee popliteal artery bypass tomorrow in the OR and we will obtain vein mapping today.   Procedure:  The patient was identified in the holding area and taken to room 8.  The patient was then placed supine on the table and prepped and draped in the usual sterile fashion.  A time out was called.  Ultrasound was used to evaluate the left common femoral artery which was noted to be patent there was a probable dissection plane in the posterior aspect of the common femoral.  The area was anesthetized 1% lidocaine and cannulated with micropuncture needle followed by wire and the sheath.  There was severe scar tissue  there which was difficult to get through ultimately we placed a Bentson wire 5 French sheath and Omni catheter to L1 we performed aortogram and then the catheter was removed over wire we performed left lower extremity angiogram.  Patient tolerated the procedure without any complication.  Sheath we pulled postoperative holding.  Contrast:  60cc   Zanovia Rotz C. Donzetta Matters, MD [facility] Office: [phone] Pager: [phone]

## 2022-01-24 NOTE — Progress Notes (Signed)
Pt admitted to rm 15 from cath lab. CHG wipe given. Initiated tele. VSS. Oriented pt to the unit. Call bell within reach.   Lavenia Atlas, RN

## 2022-01-24 NOTE — Progress Notes (Signed)
PHARMACIST LIPID MONITORING   [name] is a 71 y.o. male admitted on 01/24/2022 with PVD.  Pharmacy has been consulted to optimize lipid-lowering therapy with the indication of secondary prevention for clinical ASCVD.  Recent Labs:  Lipid Panel (last 6 months):   No results found for: "CHOL", "TRIG", "HDL", "CHOLHDL", "VLDL", "LDLCALC", "LDLDIRECT"  Hepatic function panel (last 6 months):   Lab Results  Component Value Date   AST 43 (H) 12/06/2021   ALT 50 (H) 12/06/2021   ALKPHOS 96 12/06/2021   BILITOT 0.8 12/06/2021    SCr (since admission):   Serum creatinine: 1.1 mg/dL 01/24/22 0541 Estimated creatinine clearance: 61.2 mL/min  Current therapy and lipid therapy tolerance Current lipid-lowering therapy: praluent Documented or reported allergies or intolerances to lipid-lowering therapies (if applicable): zetia, crestory, lipitor (muscle aches)   Plan:    1.Statin intensity (high intensity recommended for all patients regardless of the LDL):  Statin intolerance noted. No statin changes due to serious side effects (ex. Myalgias with at least 2 different statins).  2.Add ezetimibe (if any one of the following):   Not indicated at this time.  3.Refer to lipid clinic:   No  4.Follow-up with:  Primary care provider - Glean Hess, MD  5.Follow-up labs after discharge:  No changes in lipid therapy, repeat a lipid panel in one year.     Hildred Laser, PharmD Clinical Pharmacist **Pharmacist phone directory can now be found on [email or web address] ([email or web address]).  Listed under [place].

## 2022-01-24 NOTE — Progress Notes (Signed)
41f sheath aspirated and removed from left femoral artery. Manual pressure applied for 20 minutes. Site level 0, no s+s of hematoma. Tegaderm dressing applied, bedrest instructions given.   Left dp absent. Left pt present with doppler, right dp and pt present with doppler.   Bedrest begins at 09:00:00

## 2022-01-24 NOTE — Progress Notes (Signed)
[name] is not given tonight due to soft BP 96/65( MAP 76). Pt is alert and oriented x 4, Pt is asymptomatic. No distress, denies dizziness or light head ache. He is rest comfortable on his bed. HR 70s, AV-paced on the monitor. No respiratory distress.   Right groin incision site is level, soft and negative for bleeding or hematoma.   Doppler got signals PT and DP pulses bilaterally. Left foot is cool, right foot is warm to touch, capillary refill on both feet less than 3 sec bilaterally. We will continue to monitor.  Kennyth Lose, RN

## 2022-01-24 NOTE — Progress Notes (Signed)
Vein mapping  has been completed. Refer to [facility] under chart review to view preliminary results.   01/24/2022  3:52 PM Dorene Bruni, Bonnye Fava

## 2022-01-24 NOTE — Interval H&P Note (Signed)
History and Physical Interval Note:  01/24/2022 7:04 AM  [name]  has presented today for surgery, with the diagnosis of pvd.  The various methods of treatment have been discussed with the patient and family. After consideration of risks, benefits and other options for treatment, the patient has consented to  Procedure(s): ABDOMINAL AORTOGRAM W/LOWER EXTREMITY (N/A) as a surgical intervention.  The patient's history has been reviewed, patient examined, no change in status, stable for surgery.  I have reviewed the patient's chart and labs.  Questions were answered to the patient's satisfaction.     Servando Snare

## 2022-01-25 ENCOUNTER — Inpatient Hospital Stay (HOSPITAL_COMMUNITY): Admission: RE | Admit: 2022-01-25 | Payer: PPO | Source: Ambulatory Visit | Admitting: Vascular Surgery

## 2022-01-25 ENCOUNTER — Encounter (HOSPITAL_COMMUNITY)
Admission: AD | Disposition: A | Discharge status: Home Health | Payer: Self-pay | Source: Home / Self Care | Attending: Vascular Surgery

## 2022-01-25 ENCOUNTER — Inpatient Hospital Stay (HOSPITAL_COMMUNITY): Payer: PPO | Admitting: Vascular Surgery

## 2022-01-25 DIAGNOSIS — I110 Hypertensive heart disease with heart failure: Secondary | ICD-10-CM

## 2022-01-25 DIAGNOSIS — I252 Old myocardial infarction: Secondary | ICD-10-CM

## 2022-01-25 DIAGNOSIS — Z87891 Personal history of nicotine dependence: Secondary | ICD-10-CM

## 2022-01-25 DIAGNOSIS — I2510 Atherosclerotic heart disease of native coronary artery without angina pectoris: Secondary | ICD-10-CM

## 2022-01-25 DIAGNOSIS — I70222 Atherosclerosis of native arteries of extremities with rest pain, left leg: Secondary | ICD-10-CM

## 2022-01-25 DIAGNOSIS — I509 Heart failure, unspecified: Secondary | ICD-10-CM

## 2022-01-25 HISTORY — DX: Presence of automatic (implantable) cardiac defibrillator: Z95.810

## 2022-01-25 HISTORY — PX: ENDARTERECTOMY POPLITEAL: SHX5806

## 2022-01-25 HISTORY — PX: FEMORAL-FEMORAL BYPASS GRAFT: SHX936

## 2022-01-25 LAB — TYPE AND SCREEN
ABO/RH(D): B POS
Antibody Screen: NEGATIVE

## 2022-01-25 LAB — BASIC METABOLIC PANEL
Anion gap: 9 (ref 5–15)
BUN: 25 mg/dL — ABNORMAL HIGH (ref 8–23)
CO2: 25 mmol/L (ref 22–32)
Calcium: 8.6 mg/dL — ABNORMAL LOW (ref 8.9–10.3)
Chloride: 100 mmol/L (ref 98–111)
Creatinine, Ser: 1.08 mg/dL (ref 0.61–1.24)
GFR, Estimated: >60 mL/min (ref >60)
Glucose, Bld: 145 mg/dL — ABNORMAL HIGH (ref 70–99)
Potassium: 4.2 mmol/L (ref 3.5–5.1)
Sodium: 134 mmol/L — ABNORMAL LOW (ref 135–145)

## 2022-01-25 LAB — CBC
HCT: 37.3 % — ABNORMAL LOW (ref 39.0–52.0)
Hemoglobin: 12.8 g/dL — ABNORMAL LOW (ref 13.0–17.0)
MCH: 34.1 pg — ABNORMAL HIGH (ref 26.0–34.0)
MCHC: 34.3 g/dL (ref 30.0–36.0)
MCV: 99.5 fL (ref 80.0–100.0)
Platelets: 126 10*3/uL — ABNORMAL LOW (ref 150–400)
RBC: 3.75 MIL/uL — ABNORMAL LOW (ref 4.22–5.81)
RDW: 13.4 % (ref 11.5–15.5)
WBC: 6 10*3/uL (ref 4.0–10.5)
nRBC: 0 % (ref 0.0–0.2)

## 2022-01-25 LAB — SURGICAL PCR SCREEN
MRSA, PCR: NEGATIVE
Staphylococcus aureus: NEGATIVE

## 2022-01-25 SURGERY — CREATION, BYPASS, ARTERIAL, FEMORAL TO FEMORAL, USING GRAFT
Anesthesia: General | Site: Leg Lower | Laterality: Left

## 2022-01-25 MED ORDER — PROPOFOL 10 MG/ML IV BOLUS
INTRAVENOUS | Status: AC
  Filled 2022-01-25: qty 20

## 2022-01-25 MED ORDER — MEXILETINE HCL 150 MG PO CAPS
150.0000 mg | ORAL_CAPSULE | Freq: Two times a day (BID) | ORAL | Status: DC
  Administered 2022-01-25 – 2022-01-29 (×9): 150 mg via ORAL
  Filled 2022-01-25 (×10): qty 1

## 2022-01-25 MED ORDER — PAPAVERINE HCL 30 MG/ML IJ SOLN
INTRAMUSCULAR | Status: AC
  Filled 2022-01-25: qty 2

## 2022-01-25 MED ORDER — COLCHICINE 0.6 MG PO TABS
0.6000 mg | ORAL_TABLET | Freq: Two times a day (BID) | ORAL | Status: DC | PRN
  Administered 2022-01-29: 0.6 mg via ORAL
  Filled 2022-01-25: qty 1

## 2022-01-25 MED ORDER — ONDANSETRON HCL 4 MG/2ML IJ SOLN
INTRAMUSCULAR | Status: DC | PRN
  Administered 2022-01-25: 4 mg via INTRAVENOUS

## 2022-01-25 MED ORDER — DOCUSATE SODIUM 100 MG PO CAPS
100.0000 mg | ORAL_CAPSULE | Freq: Every day | ORAL | Status: DC
  Administered 2022-01-26 – 2022-01-29 (×4): 100 mg via ORAL
  Filled 2022-01-25 (×4): qty 1

## 2022-01-25 MED ORDER — HYDROMORPHONE HCL 1 MG/ML IJ SOLN
INTRAMUSCULAR | Status: AC
  Filled 2022-01-25: qty 1

## 2022-01-25 MED ORDER — HYDRALAZINE HCL 20 MG/ML IJ SOLN: 5.0000 mg | INTRAMUSCULAR | Status: DC | PRN

## 2022-01-25 MED ORDER — ACETAMINOPHEN 650 MG RE SUPP: 325.0000 mg | RECTAL | Status: DC | PRN

## 2022-01-25 MED ORDER — OXYCODONE-ACETAMINOPHEN 5-325 MG PO TABS
1.0000 | ORAL_TABLET | ORAL | Status: DC | PRN
  Administered 2022-01-25: 1 via ORAL
  Administered 2022-01-26 – 2022-01-29 (×8): 2 via ORAL
  Filled 2022-01-25 (×9): qty 2

## 2022-01-25 MED ORDER — OXYCODONE HCL 5 MG PO TABS: 5.0000 mg | ORAL_TABLET | Freq: Once | ORAL | Status: DC | PRN

## 2022-01-25 MED ORDER — HEMOSTATIC AGENTS (NO CHARGE) OPTIME
TOPICAL | Status: DC | PRN
  Administered 2022-01-25: 1 via TOPICAL

## 2022-01-25 MED ORDER — MAGNESIUM SULFATE 2 GM/50ML IV SOLN: 2.0000 g | Freq: Every day | INTRAVENOUS | Status: DC | PRN

## 2022-01-25 MED ORDER — LACTATED RINGERS IV SOLN: INTRAVENOUS | Status: DC | PRN

## 2022-01-25 MED ORDER — CEFAZOLIN SODIUM-DEXTROSE 2-4 GM/100ML-% IV SOLN
2.0000 g | Freq: Three times a day (TID) | INTRAVENOUS | Status: AC
  Administered 2022-01-25 (×2): 2 g via INTRAVENOUS
  Filled 2022-01-25 (×2): qty 100

## 2022-01-25 MED ORDER — 0.9 % SODIUM CHLORIDE (POUR BTL) OPTIME
TOPICAL | Status: DC | PRN
  Administered 2022-01-25: 2000 mL

## 2022-01-25 MED ORDER — SUGAMMADEX SODIUM 200 MG/2ML IV SOLN
INTRAVENOUS | Status: DC | PRN
  Administered 2022-01-25: 200 mg via INTRAVENOUS

## 2022-01-25 MED ORDER — FENTANYL CITRATE (PF) 250 MCG/5ML IJ SOLN
INTRAMUSCULAR | Status: DC | PRN
  Administered 2022-01-25: 100 ug via INTRAVENOUS
  Administered 2022-01-25: 50 ug via INTRAVENOUS

## 2022-01-25 MED ORDER — OXYCODONE HCL 5 MG/5ML PO SOLN: 5.0000 mg | Freq: Once | ORAL | Status: DC | PRN

## 2022-01-25 MED ORDER — HYDROMORPHONE HCL 1 MG/ML IJ SOLN
0.2500 mg | INTRAMUSCULAR | Status: DC | PRN
  Administered 2022-01-25 (×2): 0.25 mg via INTRAVENOUS

## 2022-01-25 MED ORDER — BISACODYL 10 MG RE SUPP: 10.0000 mg | Freq: Every day | RECTAL | Status: DC | PRN

## 2022-01-25 MED ORDER — POTASSIUM CHLORIDE CRYS ER 20 MEQ PO TBCR: 20.0000 meq | EXTENDED_RELEASE_TABLET | Freq: Every day | ORAL | Status: DC | PRN

## 2022-01-25 MED ORDER — METOPROLOL TARTRATE 5 MG/5ML IV SOLN: 2.0000 mg | INTRAVENOUS | Status: DC | PRN

## 2022-01-25 MED ORDER — HEPARIN SODIUM (PORCINE) 5000 UNIT/ML IJ SOLN
5000.0000 [IU] | Freq: Three times a day (TID) | INTRAMUSCULAR | Status: DC
  Administered 2022-01-26 – 2022-01-29 (×10): 5000 [IU] via SUBCUTANEOUS
  Filled 2022-01-25 (×10): qty 1

## 2022-01-25 MED ORDER — LIDOCAINE 2% (20 MG/ML) 5 ML SYRINGE
INTRAMUSCULAR | Status: DC | PRN
  Administered 2022-01-25: 100 mg via INTRAVENOUS

## 2022-01-25 MED ORDER — PROTAMINE SULFATE 10 MG/ML IV SOLN
INTRAVENOUS | Status: DC | PRN
  Administered 2022-01-25: 40 mg via INTRAVENOUS

## 2022-01-25 MED ORDER — SODIUM CHLORIDE 0.9 % IV SOLN: INTRAVENOUS | Status: DC

## 2022-01-25 MED ORDER — NITROGLYCERIN 0.4 MG SL SUBL: 0.4000 mg | SUBLINGUAL_TABLET | SUBLINGUAL | Status: DC | PRN

## 2022-01-25 MED ORDER — SODIUM CHLORIDE 0.9 % IV SOLN: 500.0000 mL | Freq: Once | INTRAVENOUS | Status: DC | PRN

## 2022-01-25 MED ORDER — PHENYLEPHRINE HCL-NACL 20-0.9 MG/250ML-% IV SOLN
INTRAVENOUS | Status: DC | PRN
  Administered 2022-01-25: 30 ug/min via INTRAVENOUS

## 2022-01-25 MED ORDER — HEPARIN 6000 UNIT IRRIGATION SOLUTION
Status: AC
  Filled 2022-01-25: qty 500

## 2022-01-25 MED ORDER — POLYETHYLENE GLYCOL 3350 17 G PO PACK: 17.0000 g | PACK | Freq: Every day | ORAL | Status: DC | PRN

## 2022-01-25 MED ORDER — DEXAMETHASONE SODIUM PHOSPHATE 10 MG/ML IJ SOLN
INTRAMUSCULAR | Status: DC | PRN
  Administered 2022-01-25: 5 mg via INTRAVENOUS

## 2022-01-25 MED ORDER — ONDANSETRON HCL 4 MG/2ML IJ SOLN: 4.0000 mg | Freq: Once | INTRAMUSCULAR | Status: DC | PRN

## 2022-01-25 MED ORDER — ALUM & MAG HYDROXIDE-SIMETH 200-200-20 MG/5ML PO SUSP: 15.0000 mL | ORAL | Status: DC | PRN

## 2022-01-25 MED ORDER — PHENOL 1.4 % MT LIQD: 1.0000 | OROMUCOSAL | Status: DC | PRN

## 2022-01-25 MED ORDER — PROPOFOL 10 MG/ML IV BOLUS
INTRAVENOUS | Status: DC | PRN
  Administered 2022-01-25: 140 mg via INTRAVENOUS

## 2022-01-25 MED ORDER — HEPARIN 6000 UNIT IRRIGATION SOLUTION
Status: DC | PRN
  Administered 2022-01-25: 1

## 2022-01-25 MED ORDER — CEFAZOLIN SODIUM-DEXTROSE 2-3 GM-%(50ML) IV SOLR
INTRAVENOUS | Status: DC | PRN
  Administered 2022-01-25: 2 g via INTRAVENOUS

## 2022-01-25 MED ORDER — HEPARIN SODIUM (PORCINE) 1000 UNIT/ML IJ SOLN
INTRAMUSCULAR | Status: DC | PRN
  Administered 2022-01-25: 3000 [IU] via INTRAVENOUS
  Administered 2022-01-25: 8000 [IU] via INTRAVENOUS

## 2022-01-25 MED ORDER — FENTANYL CITRATE (PF) 250 MCG/5ML IJ SOLN
INTRAMUSCULAR | Status: AC
  Filled 2022-01-25: qty 5

## 2022-01-25 MED ORDER — PHENYLEPHRINE 80 MCG/ML (10ML) SYRINGE FOR IV PUSH (FOR BLOOD PRESSURE SUPPORT)
PREFILLED_SYRINGE | INTRAVENOUS | Status: DC | PRN
  Administered 2022-01-25: 80 ug via INTRAVENOUS
  Administered 2022-01-25 (×2): 160 ug via INTRAVENOUS

## 2022-01-25 MED ORDER — LABETALOL HCL 5 MG/ML IV SOLN: 10.0000 mg | INTRAVENOUS | Status: DC | PRN

## 2022-01-25 MED ORDER — GUAIFENESIN-DM 100-10 MG/5ML PO SYRP: 15.0000 mL | ORAL_SOLUTION | ORAL | Status: DC | PRN

## 2022-01-25 MED ORDER — ALIROCUMAB 150 MG/ML ~~LOC~~ SOAJ: 150.0000 mg | SUBCUTANEOUS | Status: DC

## 2022-01-25 MED ORDER — ROCURONIUM BROMIDE 10 MG/ML (PF) SYRINGE
PREFILLED_SYRINGE | INTRAVENOUS | Status: DC | PRN
  Administered 2022-01-25: 70 mg via INTRAVENOUS

## 2022-01-25 MED ORDER — ONDANSETRON HCL 4 MG/2ML IJ SOLN: 4.0000 mg | Freq: Four times a day (QID) | INTRAMUSCULAR | Status: DC | PRN

## 2022-01-25 MED ORDER — PANTOPRAZOLE SODIUM 40 MG PO TBEC
40.0000 mg | DELAYED_RELEASE_TABLET | Freq: Every day | ORAL | Status: DC
  Administered 2022-01-25 – 2022-01-29 (×5): 40 mg via ORAL
  Filled 2022-01-25 (×5): qty 1

## 2022-01-25 MED ORDER — MORPHINE SULFATE (PF) 2 MG/ML IV SOLN
2.0000 mg | INTRAVENOUS | Status: DC | PRN
  Administered 2022-01-25 – 2022-01-26 (×6): 2 mg via INTRAVENOUS
  Filled 2022-01-25 (×7): qty 1

## 2022-01-25 MED ORDER — ACETAMINOPHEN 325 MG PO TABS
325.0000 mg | ORAL_TABLET | ORAL | Status: DC | PRN
  Administered 2022-01-27: 650 mg via ORAL
  Filled 2022-01-25: qty 2

## 2022-01-25 SURGICAL SUPPLY — 44 items
ADH SKN CLS APL DERMABOND .7 (GAUZE/BANDAGES/DRESSINGS) ×1
ADH SKN CLS LQ APL DERMABOND (GAUZE/BANDAGES/DRESSINGS) ×4
BAG COUNTER SPONGE SURGICOUNT (BAG) ×2 IMPLANT
BAG SPNG CNTER NS LX DISP (BAG) ×1
CANISTER SUCT 3000ML PPV (MISCELLANEOUS) ×2 IMPLANT
CANNULA VESSEL 3MM 2 BLNT TIP (CANNULA) IMPLANT
CLIP LIGATING EXTRA MED SLVR (CLIP) ×2 IMPLANT
CLIP LIGATING EXTRA SM BLUE (MISCELLANEOUS) ×2 IMPLANT
COVER PROBE CYLINDRICAL 5X96 (MISCELLANEOUS) IMPLANT
DERMABOND ADVANCED .7 DNX12 (GAUZE/BANDAGES/DRESSINGS) ×2 IMPLANT
DERMABOND ADVANCED .7 DNX6 (GAUZE/BANDAGES/DRESSINGS) IMPLANT
DRAIN CHANNEL 15F RND FF W/TCR (WOUND CARE) IMPLANT
DRAPE HALF SHEET 40X57 (DRAPES) IMPLANT
ELECT REM PT RETURN 9FT ADLT (ELECTROSURGICAL) ×1
ELECTRODE REM PT RTRN 9FT ADLT (ELECTROSURGICAL) ×2 IMPLANT
EVACUATOR SILICONE 100CC (DRAIN) IMPLANT
GAUZE SPONGE 4X4 12PLY STRL (GAUZE/BANDAGES/DRESSINGS) IMPLANT
GLOVE BIO SURGEON STRL SZ 6.5 (GLOVE) IMPLANT
GLOVE BIO SURGEON STRL SZ7.5 (GLOVE) ×2 IMPLANT
GOWN STRL REUS W/ TWL LRG LVL3 (GOWN DISPOSABLE) ×4 IMPLANT
GOWN STRL REUS W/ TWL XL LVL3 (GOWN DISPOSABLE) ×2 IMPLANT
GOWN STRL REUS W/TWL LRG LVL3 (GOWN DISPOSABLE) ×2
GOWN STRL REUS W/TWL XL LVL3 (GOWN DISPOSABLE) ×1
HEMOSTAT SNOW SURGICEL 2X4 (HEMOSTASIS) IMPLANT
KIT BASIN OR (CUSTOM PROCEDURE TRAY) ×2 IMPLANT
KIT TURNOVER KIT B (KITS) ×2 IMPLANT
NS IRRIG 1000ML POUR BTL (IV SOLUTION) ×4 IMPLANT
PACK PERIPHERAL VASCULAR (CUSTOM PROCEDURE TRAY) ×2 IMPLANT
PAD ARMBOARD 7.5X6 YLW CONV (MISCELLANEOUS) ×4 IMPLANT
POWDER SURGICEL 3.0 GRAM (HEMOSTASIS) IMPLANT
SUT MNCRL AB 4-0 PS2 18 (SUTURE) IMPLANT
SUT PROLENE 5 0 C 1 24 (SUTURE) ×4 IMPLANT
SUT PROLENE 6 0 BV (SUTURE) ×2 IMPLANT
SUT PROLENE 7 0 BV1 MDA (SUTURE) IMPLANT
SUT SILK 3 0 (SUTURE) ×2
SUT SILK 3-0 18XBRD TIE 12 (SUTURE) IMPLANT
SUT VIC AB 2-0 CT1 27 (SUTURE) ×3
SUT VIC AB 2-0 CT1 TAPERPNT 27 (SUTURE) ×4 IMPLANT
SUT VIC AB 3-0 SH 27 (SUTURE) ×4
SUT VIC AB 3-0 SH 27X BRD (SUTURE) ×4 IMPLANT
SYR 20ML LL LF (SYRINGE) IMPLANT
TOWEL GREEN STERILE (TOWEL DISPOSABLE) ×2 IMPLANT
TRAY FOLEY MTR SLVR 16FR STAT (SET/KITS/TRAYS/PACK) ×2 IMPLANT
WATER STERILE IRR 1000ML POUR (IV SOLUTION) ×2 IMPLANT

## 2022-01-25 NOTE — Anesthesia Postprocedure Evaluation (Signed)
Anesthesia Post Note  Patient: Kayde Warehime Mccorkle  Procedure(s) Performed: left Femoral to below the knee Popliteal bypass. (Left: Leg Lower) ENDARTERECTOMY POPLITEAL (Left: Leg Lower)     Patient location during evaluation: PACU Anesthesia Type: General Level of consciousness: awake and alert Pain management: pain level controlled Vital Signs Assessment: post-procedure vital signs reviewed and stable Respiratory status: spontaneous breathing, nonlabored ventilation, respiratory function stable and patient connected to nasal cannula oxygen Cardiovascular status: blood pressure returned to baseline and stable Postop Assessment: no apparent nausea or vomiting Anesthetic complications: no  No notable events documented.  Last Vitals:  Vitals:   01/25/22 1145 01/25/22 1200  BP: (!) 87/68 116/75  Pulse: 74 73  Resp: 12 14  Temp:    SpO2: 95% 94%    Last Pain:  Vitals:   01/25/22 1200  TempSrc:   PainSc: 8                  Hiral Lukasiewicz S

## 2022-01-25 NOTE — Progress Notes (Signed)
Pt is transferred to OR at 06.48 am. His BP prior transferring is 111/70, HR 82, SPO2 97%. No acute distress.   Kennyth Lose, RN

## 2022-01-25 NOTE — Progress Notes (Signed)
  Progress Note    01/25/2022 7:13 AM Day of Surgery  Subjective:  left foot remains numb  Vitals:   01/25/22 0447 01/25/22 0647  BP: (!) 119/55 111/70  Pulse: 75 82  Resp: 14 15  Temp: 97.7 F (36.5 C) 97.7 F (36.5 C)  SpO2: 97% 97%    Physical Exam: Aaox3 Left groin is soft  CBC    Component Value Date/Time   WBC 6.0 01/25/2022 0135   RBC 3.75 (L) 01/25/2022 0135   HGB 12.8 (L) 01/25/2022 0135   HGB 11.9 (L) 12/04/2019 1022   HCT 37.3 (L) 01/25/2022 0135   HCT 35.3 (L) 12/04/2019 1022   PLT 126 (L) 01/25/2022 0135   PLT 326 12/04/2019 1022   MCV 99.5 01/25/2022 0135   MCV 89 12/04/2019 1022   MCH 34.1 (H) 01/25/2022 0135   MCHC 34.3 01/25/2022 0135   RDW 13.4 01/25/2022 0135   RDW 12.3 12/04/2019 1022   LYMPHSABS 1.0 12/04/2019 1022   MONOABS 0.7 10/24/2019 0716   EOSABS 0.5 (H) 12/04/2019 1022   BASOSABS 0.1 12/04/2019 1022    BMET    Component Value Date/Time   NA 134 (L) 01/25/2022 0135   NA 144 11/18/2020 1011   K 4.2 01/25/2022 0135   CL 100 01/25/2022 0135   CO2 25 01/25/2022 0135   GLUCOSE 145 (H) 01/25/2022 0135   BUN 25 (H) 01/25/2022 0135   BUN 19 11/18/2020 1011   CREATININE 1.08 01/25/2022 0135   CREATININE 1.12 07/23/2015 1016   CALCIUM 8.6 (L) 01/25/2022 0135   GFRNONAA >60 01/25/2022 0135   GFRAA 85 04/22/2020 1013    INR    Component Value Date/Time   INR 0.9 02/15/2021 1004     Intake/Output Summary (Last 24 hours) at 01/25/2022 3532 Last data filed at 01/25/2022 9924 Gross per 24 hour  Intake 1117.07 ml  Output --  Net 1117.07 ml     Assessment:  71 y.o. male is s/p LLE angiogram for clti with rest pain  Day of Surgery  Plan: OR today for left common femoral to below knee popliteal artery bypass with vein   [name] C. Donzetta Matters, MD [facility] [place] Office: [phone] Pager: [phone]  01/25/2022 7:13 AM

## 2022-01-25 NOTE — Discharge Instructions (Signed)
 [facility]  Discharge instructions  Lower Extremity Bypass Surgery  Please refer to the following instruction for your post-procedure care. Your surgeon or physician assistant will discuss any changes with you.  Activity  You are encouraged to walk as much as you can. You can slowly return to normal activities during the month after your surgery. Avoid strenuous activity and heavy lifting until your doctor tells you it's OK. Avoid activities such as vacuuming or swinging a golf club. Do not drive until your doctor give the OK and you are no longer taking prescription pain medications. It is also normal to have difficulty with sleep habits, eating and bowel movement after surgery. These will go away with time.  Bathing/Showering  Shower daily after you go home. Do not soak in a bathtub, hot tub, or swim until the incision heals completely.  Incision Care  Clean your incision with mild soap and water. Shower every day. Pat the area dry with a clean towel. You do not need a bandage unless otherwise instructed. Do not apply any ointments or creams to your incision. If you have open wounds you will be instructed how to care for them or a visiting nurse may be arranged for you. If you have staples or sutures along your incision they will be removed at your post-op appointment. You may have skin glue on your incision. Do not peel it off. It will come off on its own in about one week.  Wash the groin wound with soap and water daily and pat dry. (No tub bath-only shower)  Then put a dry gauze or washcloth in the groin to keep this area dry to help prevent wound infection.  Do this daily and as needed.  Do not use Vaseline or neosporin on your incisions.  Only use soap and water on your incisions and then protect and keep dry.  Diet  Resume your normal diet. There are no special food restrictions following this procedure. A low fat/ low cholesterol diet is  recommended for all patients with vascular disease. In order to heal from your surgery, it is CRITICAL to get adequate nutrition. Your body requires vitamins, minerals, and protein. Vegetables are the best source of vitamins and minerals. Vegetables also provide the perfect balance of protein. Processed food has little nutritional value, so try to avoid this.  Medications  Resume taking all your medications unless your doctor or physician assistant tells you not to. If your incision is causing pain, you may take over-the-counter pain relievers such as acetaminophen (Tylenol). If you were prescribed a stronger pain medication, please aware these medication can cause nausea and constipation. Prevent nausea by taking the medication with a snack or meal. Avoid constipation by drinking plenty of fluids and eating foods with high amount of fiber, such as fruits, vegetables, and grains. Take Colace 100 mg (an over-the-counter stool softener) twice a day as needed for constipation.  Do not take Tylenol if you are taking prescription pain medications.  Follow Up  Our office will schedule a follow up appointment 2-3 weeks following discharge.  Please call us immediately for any of the following conditions  Severe or worsening pain in your legs or feet while at rest or while walking Increase pain, redness, warmth, or drainage (pus) from your incision site(s) Fever of 101 degree or higher The swelling in your leg with the bypass suddenly worsens and becomes more painful than when you were in the hospital If you have   been instructed to feel your graft pulse then you should do so every day. If you can no longer feel this pulse, call the office immediately. Not all patients are given this instruction.  Leg swelling is common after leg bypass surgery.  The swelling should improve over a few months following surgery. To improve the swelling, you may elevate your legs above the level of your heart while you are  sitting or resting. Your surgeon or physician assistant may ask you to apply an ACE wrap or wear compression (TED) stockings to help to reduce swelling.  Reduce your risk of vascular disease  Stop smoking. If you would like help call [facility] at 1-800-QUIT-NOW (1-800-784-8669) or [place] at 336-586-4000.  Manage your cholesterol Maintain a desired weight Control your diabetes weight Control your diabetes Keep your blood pressure down  If you have any questions, please call the office at [phone]  

## 2022-01-25 NOTE — Progress Notes (Signed)
BP has been trending down to 82/54-85/61 mmHg. Pt is a symptomatically resting on his bed. No bleeding or hematoma on left groin, soft and no pain.  His HR is in 70s, AV-paced on the monitor. Pt got Carvedilol 12.5 mg at 17:12. We held his Entresto at bedtime.   Dr. Carlis Abbott was notified.  Lab CBC after blood drawn at 01:35 am Hb12.8/ Hct 37.3%.  Dr. Carlis Abbott ordered Nacl 125 ml/hr. Pt is asymptomatic and no acute distress, and no signs of active retroperitoneal bleeding or hematoma, therefore, we will continue to monitor.   Kennyth Lose, RN

## 2022-01-25 NOTE — Progress Notes (Signed)
Pt came back to rm 15 from PACU. Reinitiated tele. VSS. Call bell within reach.   Lavenia Atlas, RN

## 2022-01-25 NOTE — Op Note (Signed)
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 Donzetta Matters, MD Assistant: Leontine Locket, PA Procedure Performed: 1.  Harvest left greater saphenous vein 2.  Left popliteal and tibioperoneal trunk endarterectomy and removal of Viabahn stent 3.  Left SFA to tibioperoneal trunk bypass with ipsilateral, nonreversed, translocated greater saphenous vein   Indications: 71 year old male with history of multiple left lower extremity endovascular procedures now with occlusion of his stents and indicated for bypass.  Findings: The SFA was soft and with very strong pulse and was used for inflow given previous left common femoral endarterectomy with significant scarring.  The greater saphenous vein was easily dilated to over 4 mm throughout its course and was used in a nonreversed fashion as a bypass conduit.  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  I did remove the most distal Viabahn stent given that there was acute thrombus within this but there was no antegrade bleeding from the distal popliteal artery.  At completion there were posterior tibial anterior tibial and peroneal artery signals at the ankle that were graft dependent.   Procedure:  The patient was identified in the holding area and taken to the operating room was placed supine operative table and general anesthesia was induced.  He was sterilely prepped draped in left lower extremity usual fashion, antibiotics were administered and a timeout was called.  We began using ultrasound to identify the saphenous vein throughout the  left lower extremity.  I also identified the SFA just above the stent and a vertical incision was made just overlying this we dissected down to the SFA was soft and compressible and very strong pulsatility.  We then made separate skin incisions above the need to dissected out the greater saphenous vein which was noted to be large.  Below the knee I made 2 separate incisions dissected the vein out through its entirety then clamped distally tied off transected and then dissected back very near the saphenofemoral junction clamped this tied off with 2-0 silk suture passed off the back table.  I then dissected through the deep fascia down to the popliteal artery.  We dissected the remaining veins divided between ties identify the anterior tibial artery encircled this as well as the distal popliteal and down to the tibioperoneal trunk bifurcation where there is dense scar tissue given previous stenting.  A tunneler was then placed from this incision up to the SFA exposure incision patient was fully heparinized.  I then clamped the SFA distally proximally opened longitudinally.  This was very soft patent there was minimal plaque there I did remove some of this and then spatulated the vein nonreversed and sewed in place 5-0 Prolene suture.  Upon completion we allowed flushing through the vein we then lysed all the valves there was very strong antegrade pulsatility.  We then turned our attention distally.  The outflow in the inflow was clamped.  We opened the vessel longitudinally there was soft and very calcified plaque.  Endarterectomy was performed.  Distally I tacked the plaque at the most distal aspect of the tibioperoneal trunk.  There was good backbleeding from the anterior tibial artery.  I also removed  one of the Viabahn stents given that there was acute thrombus within it.  We then straighten the leg.  The vein was spatulated and sewn in place with 6-0 Prolene suture.  Prior completion without flushing or  retrograde block lesion was very strong pulsatility.  There was strong tibial signals at the ankle I could not feel pulses but the signals were graft dependent.  Satisfied with this we administered protamine obtain hemostasis and the wounds closed in layers with Vicryl Monocryl.  Patient was awakened from anesthesia having tolerated procedure without any complication.  All counts were correct at completion.   Given the complexity of the case,  the assistant was necessary in order to expedient the procedure and safely perform the technical aspects of the operation.  The assistant provided traction and countertraction to assist with exposure of the artery proximally and distally.  They assisted with suture ligature of multiple branches.  Their assistance was critical in the performance of both the proximal and distal anastomosis.These skills, especially following the Prolene suture for the anastomosis, could not have been adequately performed by a scrub tech assistant.     EBL: 100 cc.    Bethani Brugger C. Donzetta Matters, MD [facility] Office: [phone] Pager: [phone]

## 2022-01-25 NOTE — Anesthesia Procedure Notes (Signed)
Arterial Line Insertion Start/End11/28/2023 7:05 AM Performed by: Darletta Moll, CRNA, CRNA  Patient location: Pre-op. Preanesthetic checklist: patient identified, IV checked, site marked, risks and benefits discussed, surgical consent, monitors and equipment checked, pre-op evaluation, timeout performed and anesthesia consent Lidocaine 1% used for infiltration Right, radial was placed Catheter size: 20 G Hand hygiene performed  and maximum sterile barriers used   Attempts: 1 Procedure performed without using ultrasound guided technique. Following insertion, dressing applied and Biopatch. Post procedure assessment: normal and unchanged  Patient tolerated the procedure well with no immediate complications.

## 2022-01-25 NOTE — Progress Notes (Signed)
PT Cancellation Note  Patient Details Name: [name] MRN: [id number] DOB: [birth year]   Cancelled Treatment:    Reason Eval/Treat Not Completed: Pain limiting ability to participate. RN reporting pt is in too much pain and had just returned from OR a couple hours ago. Will plan to follow-up tomorrow as able.   Moishe Spice, PT, DPT Acute Rehabilitation Services  Office: [place] 01/25/2022, 3:07 PM

## 2022-01-25 NOTE — Transfer of Care (Signed)
Immediate Anesthesia Transfer of Care Note  Patient: [name]  Procedure(s) Performed: left Femoral to below the knee Popliteal bypass. (Left: Leg Lower) ENDARTERECTOMY POPLITEAL (Left: Leg Lower)  Patient Location: PACU  Anesthesia Type:General  Level of Consciousness: awake and alert   Airway & Oxygen Therapy: Patient Spontanous Breathing and Patient connected to nasal cannula oxygen  Post-op Assessment: Report given to RN and Post -op Vital signs reviewed and stable  Post vital signs: Reviewed and stable  Last Vitals:  Vitals Value Taken Time  BP 103/54   Temp    Pulse 79   Resp 10   SpO2 93     Last Pain:  Vitals:   01/25/22 0647  TempSrc: Oral  PainSc: 0-No pain         Complications: No notable events documented.

## 2022-01-25 NOTE — Anesthesia Procedure Notes (Signed)
Procedure Name: Intubation Date/Time: 01/25/2022 7:53 AM  Performed by: Darletta Moll, CRNAPre-anesthesia Checklist: Patient identified, Emergency Drugs available, Suction available and Patient being monitored Patient Re-evaluated:Patient Re-evaluated prior to induction Oxygen Delivery Method: Circle system utilized Preoxygenation: Pre-oxygenation with 100% oxygen Induction Type: IV induction Ventilation: Mask ventilation without difficulty, Two handed mask ventilation required and Oral airway inserted - appropriate to patient size Laryngoscope Size: Mac and 4 Grade View: Grade I Tube type: Oral Tube size: 7.5 mm Number of attempts: 1 Airway Equipment and Method: Stylet Placement Confirmation: ETT inserted through vocal cords under direct vision, positive ETCO2 and breath sounds checked- equal and bilateral Secured at: 22 cm Tube secured with: Tape Dental Injury: Teeth and Oropharynx as per pre-operative assessment

## 2022-01-25 NOTE — Progress Notes (Signed)
  Day of Surgery Note    Subjective:  says he doesn't know where he is.  Denies pain in his left foot.    Vitals:   01/25/22 1120 01/25/22 1130  BP: 117/64 (!) 129/91  Pulse: 80 73  Resp: 18 16  Temp: 98.3 F (36.8 C)   SpO2: 92% 92%    Incisions:   incisions look good Extremities:  brisk doppler flow left AT/PT Cardiac:  regular Lungs:  non labored    Assessment/Plan:  This is a 71 y.o. male who is s/p  Left SFA to below knee popliteal bypass with ipsilateral non reversed GSV  -pt with brisk doppler flow at the ankle.  Left foot is cool-wrap with warm blanket.  Motor on sensation are in tact.  -to [place] later today. -continue asa.  Hold Eliquis. -for injectable statin tomorrow - dose is due.    Leontine Locket, PA-C 01/25/2022 11:47 AM [phone]

## 2022-01-26 ENCOUNTER — Encounter (HOSPITAL_COMMUNITY): Payer: Self-pay | Admitting: Vascular Surgery

## 2022-01-26 LAB — CBC
HCT: 33.5 % — ABNORMAL LOW (ref 39.0–52.0)
HCT: 31.9 % — ABNORMAL LOW (ref 39.0–52.0)
Hemoglobin: 10.6 g/dL — ABNORMAL LOW (ref 13.0–17.0)
Hemoglobin: 10.5 g/dL — ABNORMAL LOW (ref 13.0–17.0)
MCH: 32.8 pg (ref 26.0–34.0)
MCH: 34.3 pg — ABNORMAL HIGH (ref 26.0–34.0)
MCHC: 31.6 g/dL (ref 30.0–36.0)
MCHC: 32.9 g/dL (ref 30.0–36.0)
MCV: 103.7 fL — ABNORMAL HIGH (ref 80.0–100.0)
MCV: 104.2 fL — ABNORMAL HIGH (ref 80.0–100.0)
Platelets: 102 10*3/uL — ABNORMAL LOW (ref 150–400)
Platelets: 110 10*3/uL — ABNORMAL LOW (ref 150–400)
RBC: 3.23 MIL/uL — ABNORMAL LOW (ref 4.22–5.81)
RBC: 3.06 MIL/uL — ABNORMAL LOW (ref 4.22–5.81)
RDW: 13.9 % (ref 11.5–15.5)
RDW: 14.1 % (ref 11.5–15.5)
WBC: 4.2 10*3/uL (ref 4.0–10.5)
WBC: 4.2 10*3/uL (ref 4.0–10.5)
nRBC: 0 % (ref 0.0–0.2)
nRBC: 0 % (ref 0.0–0.2)

## 2022-01-26 LAB — TROPONIN I (HIGH SENSITIVITY)
Troponin I (High Sensitivity): 7 ng/L (ref <18)
Troponin I (High Sensitivity): 8 ng/L (ref <18)

## 2022-01-26 LAB — BASIC METABOLIC PANEL
Anion gap: 7 (ref 5–15)
BUN: 15 mg/dL (ref 8–23)
CO2: 25 mmol/L (ref 22–32)
Calcium: 7.7 mg/dL — ABNORMAL LOW (ref 8.9–10.3)
Chloride: 105 mmol/L (ref 98–111)
Creatinine, Ser: 0.8 mg/dL (ref 0.61–1.24)
GFR, Estimated: >60 mL/min (ref >60)
Glucose, Bld: 101 mg/dL — ABNORMAL HIGH (ref 70–99)
Potassium: 4 mmol/L (ref 3.5–5.1)
Sodium: 137 mmol/L (ref 135–145)

## 2022-01-26 LAB — GLUCOSE, CAPILLARY: Glucose-Capillary: 111 mg/dL — ABNORMAL HIGH (ref 70–99)

## 2022-01-26 MED ORDER — SODIUM CHLORIDE 0.9 % IV BOLUS
500.0000 mL | Freq: Once | INTRAVENOUS | Status: AC
  Administered 2022-01-26: 500 mL via INTRAVENOUS

## 2022-01-26 MED ORDER — SODIUM CHLORIDE 0.9 % IV BOLUS
1000.0000 mL | Freq: Once | INTRAVENOUS | Status: AC
  Administered 2022-01-26: 1000 mL via INTRAVENOUS

## 2022-01-26 NOTE — Significant Event (Signed)
Rapid Response Event Note   Reason for Call :  Hypotension 67/51 Per RN BP confirmed with manual BP cuff  Initial Focused Assessment:  Patient is currently lying in the bed.  He is alert and oriented. He has been active this am, per patient he has ambulated and has been out of bed since 0930 Lung sounds decreased bases. Doppler pedal pulse   BP 91/55  AV paced 73  RR 17  O2 sat 93% on RA  Vascular PA, Laurence Slate also at bedside to assess patient   Interventions:  1L NS bolus CBC ordered  Plan of Care:  Adjust pain medications   Event Summary:   MD NotifiedTheda Sers PA came to bedside Call Time: [place] Time: 0052 End Time: [place]  Raliegh Ip, RN

## 2022-01-26 NOTE — Progress Notes (Signed)
  Progress Note    01/26/2022 5:47 AM 1 Day Post-Op  Subjective: Leg feels heavy  Vitals:   01/25/22 2317 01/26/22 0300  BP: 93/67 (!) 101/57  Pulse: 74 74  Resp: 16 12  Temp: 97.9 F (36.6 C) 98 F (36.7 C)  SpO2: 96% 97%    Physical Exam: Awake alert and oriented Left lower extremity incisions clean dry intact Very strong left dorsalis pedis and posterior tibial signals of the left foot and the foot is very warm  CBC    Component Value Date/Time   WBC 6.0 01/25/2022 0135   RBC 3.75 (L) 01/25/2022 0135   HGB 12.8 (L) 01/25/2022 0135   HGB 11.9 (L) 12/04/2019 1022   HCT 37.3 (L) 01/25/2022 0135   HCT 35.3 (L) 12/04/2019 1022   PLT 126 (L) 01/25/2022 0135   PLT 326 12/04/2019 1022   MCV 99.5 01/25/2022 0135   MCV 89 12/04/2019 1022   MCH 34.1 (H) 01/25/2022 0135   MCHC 34.3 01/25/2022 0135   RDW 13.4 01/25/2022 0135   RDW 12.3 12/04/2019 1022   LYMPHSABS 1.0 12/04/2019 1022   MONOABS 0.7 10/24/2019 0716   EOSABS 0.5 (H) 12/04/2019 1022   BASOSABS 0.1 12/04/2019 1022    BMET    Component Value Date/Time   NA 134 (L) 01/25/2022 0135   NA 144 11/18/2020 1011   K 4.2 01/25/2022 0135   CL 100 01/25/2022 0135   CO2 25 01/25/2022 0135   GLUCOSE 145 (H) 01/25/2022 0135   BUN 25 (H) 01/25/2022 0135   BUN 19 11/18/2020 1011   CREATININE 1.08 01/25/2022 0135   CREATININE 1.12 07/23/2015 1016   CALCIUM 8.6 (L) 01/25/2022 0135   GFRNONAA >60 01/25/2022 0135   GFRAA 85 04/22/2020 1013    INR    Component Value Date/Time   INR 0.9 02/15/2021 1004     Intake/Output Summary (Last 24 hours) at 01/26/2022 0547 Last data filed at 01/25/2022 2318 Gross per 24 hour  Intake 2861.32 ml  Output 1475 ml  Net 1386.32 ml     Assessment:  71 y.o. male is s/p SFA to tibial peroneal trunk bypass with vein 1 Day Post-Op  Plan: IV fluids decreased we will follow-up labs Subcutaneous heparin plan to transition to home dose oral anticoagulation prior to  discharge Out of bed with PT   Vestal Crandall C. Donzetta Matters, MD [facility] Office: [phone] Pager: [phone]  01/26/2022 5:47 AM

## 2022-01-26 NOTE — Evaluation (Signed)
Occupational Therapy Evaluation Patient Details Name: [name] MRN: [id number] DOB: 1950-06-26 Today's Date: 01/26/2022   History of Present Illness Pt is a 71 y/o M admitted to [facility] on 01/24/22 with PVD, LLE angiogram and abdominal aortogram performed. Additional operation of femoral bypass graft and popliteal endarterectomy 11/28. Of note, recent hospitalization (10/30-11/1) with LLE claudication tx. PMH of CABG (s/p 2021), CHF, GERD, PVD, arthritis, HTN, HLD.   Clinical Impression   PTA patient independent and driving. Admitted for above and presents with problem list below, including impaired balance, pain in L LE and decreased activity tolerance.  He demonstrates ability to complete transfers and functional mobility in room with min guard using RW, requires up to min assist for ADLs.  [name], anticipate he will progress well acutely.  Will follow acutely but no further OT needs after dc.       Recommendations for follow up therapy are one component of a multi-disciplinary discharge planning process, led by the attending physician.  Recommendations may be updated based on patient status, additional functional criteria and insurance authorization.   Follow Up Recommendations  No OT follow up     Assistance Recommended at Discharge PRN  Patient can return home with the following A little help with walking and/or transfers;A little help with bathing/dressing/bathroom;Assistance with cooking/housework;Assist for transportation;Help with stairs or ramp for entrance    Functional Status Assessment  Patient has had a recent decline in their functional status and demonstrates the ability to make significant improvements in function in a reasonable and predictable amount of time.  Equipment Recommendations  None recommended by OT    Recommendations for Other Services       Precautions / Restrictions Precautions Precautions: Fall Restrictions Weight  Bearing Restrictions: No      Mobility Bed Mobility               General bed mobility comments: OOB in recliner upon entry    Transfers Overall transfer level: Needs assistance Equipment used: Rolling walker (2 wheels) Transfers: Sit to/from Stand Sit to Stand: Min assist           General transfer comment: cueing for hand placement, min guard for balance and safety but no physical assistance required      Balance Overall balance assessment: Needs assistance Sitting-balance support: Single extremity supported, Feet supported Sitting balance-Leahy Scale: Good     Standing balance support: Bilateral upper extremity supported, During functional activity, No upper extremity supported Standing balance-Leahy Scale: Poor Standing balance comment: relies on RW for mobility, able to release 1 UE with min guard but up to min assist with 0 hand support                           ADL either performed or assessed with clinical judgement   ADL Overall ADL's : Needs assistance/impaired     Grooming: Min guard;Standing           Upper Body Dressing : Set up;Sitting   Lower Body Dressing: Sit to/from stand;Minimal assistance Lower Body Dressing Details (indicate cue type and reason): assist for R sock, min guard in standing Toilet Transfer: Min guard;Ambulation;Rolling walker (2 wheels)           Functional mobility during ADLs: Min guard;Rolling walker (2 wheels)       Vision   Vision Assessment?: No apparent visual deficits     Perception  Praxis      Pertinent Vitals/Pain Pain Assessment Pain Assessment: Faces Faces Pain Scale: Hurts little more Pain Descriptors / Indicators: Cramping, Heaviness Pain Intervention(s): Limited activity within patient's tolerance, Monitored during session, Repositioned     Hand Dominance Right   Extremity/Trunk Assessment Upper Extremity Assessment Upper Extremity Assessment: Overall WFL for tasks  assessed   Lower Extremity Assessment Lower Extremity Assessment: Defer to PT evaluation LLE Deficits / Details: Pain/heaviness limiting AROM but did demonstrate AROM of hip flexion and ankle DF with increased time; pt able to WB through LLE with increased time   Cervical / Trunk Assessment Cervical / Trunk Assessment: Normal   Communication Communication Communication: No difficulties   Cognition Arousal/Alertness: Awake/alert Behavior During Therapy: WFL for tasks assessed/performed Overall Cognitive Status: Within Functional Limits for tasks assessed                                       General Comments  spouse present and supportive    Exercises     Shoulder Instructions      Home Living Family/patient expects to be discharged to:: Private residence Living Arrangements: Spouse/significant other Available Help at Discharge: Family;Available 24 hours/day Type of Home: House Home Access: Stairs to enter [employer] of Steps: 2-3 Entrance Stairs-Rails: Can reach both Home Layout: One level     Bathroom Shower/Tub: Occupational psychologist: Handicapped height Bathroom Accessibility: Yes   Home Equipment: Conservation officer, nature (2 wheels);Shower seat;Grab bars - toilet;Grab bars - tub/shower   Additional Comments: wife is able to be at home with pt for at least a week post d/c      Prior Functioning/Environment Prior Level of Function : Independent/Modified Independent;Driving             Mobility Comments: ambulates without device although does have RW at home from previous CABG operation ADLs Comments: independent with IADLs and self care, wife performs majority of cooking/cleaning but husband is capable of assisting        OT Problem List: Decreased activity tolerance;Impaired balance (sitting and/or standing);Decreased knowledge of use of DME or AE;Decreased knowledge of precautions;Pain      OT Treatment/Interventions: DME  and/or AE instruction;Self-care/ADL training;Therapeutic activities;Patient/family education;Balance training    OT Goals(Current goals can be found in the care plan section) Acute Rehab OT Goals Patient Stated Goal: home, less pain OT Goal Formulation: With patient Time For Goal Achievement: 02/09/22 Potential to Achieve Goals: Good  OT Frequency: Min 2X/week    Co-evaluation              AM-PAC OT "6 Clicks" Daily Activity     Outcome Measure Help from another person eating meals?: None Help from another person taking care of personal grooming?: A Little Help from another person toileting, which includes using toliet, bedpan, or urinal?: A Little Help from another person bathing (including washing, rinsing, drying)?: A Little Help from another person to put on and taking off regular upper body clothing?: A Little Help from another person to put on and taking off regular lower body clothing?: A Little 6 Click Score: 19   End of Session Equipment Utilized During Treatment: Rolling walker (2 wheels) Nurse Communication: Mobility status  Activity Tolerance: Patient tolerated treatment well Patient left: in chair;with call bell/phone within reach;with family/visitor present;with nursing/sitter in room  OT Visit Diagnosis: Other abnormalities of gait and mobility (R26.89);Pain Pain -  Right/Left: Left Pain - part of body: Leg                Time: 0211-1735 OT Time Calculation (min): 12 min Charges:  OT General Charges $OT Visit: 1 Visit OT Evaluation $OT Eval Moderate Complexity: [place], OT Acute Rehabilitation Services Office [phone]   [name] 01/26/2022, 12:24 PM

## 2022-01-26 NOTE — Progress Notes (Addendum)
Pt's bp=72/58 (manual). Pt experienced light dizziness.  Paged PA and MD on  call. Received verbal order for 1L bolus and CBC stat. Rapid response nurse presented at the bedside. CBG=111. PA presented at the bedside.   Lavenia Atlas, RN

## 2022-01-26 NOTE — Progress Notes (Signed)
     Patient POD # 2 left LE bypass Called this am with report of mild hypotension low 45'Y systolic Reported patient was in p[ain and was requiring IV Morphine.  RN states she wants the BP improved prior to giving Morphine due to possible decrease further in BP  Patient was asymptomatic, HR was 75-78, Cr WNL with good urine OP  Non labored breathing  500 cc bolus NS ordered.  Received second call about hypotension now reported systolic of 72 with minimal dizziness in the seated position.  He has ambulated x 2 today and sat up for 2 hours.  Dr. Trula Slade ordered 1000 cc NS bolus.  At bedside the BP was read as systolic of 93 and the bolus had just been started.  Brisk doppler signals Left DP/PT, incisions healing well with out edema or active bleeding  I will hold his BB for now and D/C Morphine.  Pain control will be with PO only HGB 10.6 this am   Will observe for now.  Roxy Horseman PA-C

## 2022-01-26 NOTE — Evaluation (Signed)
Physical Therapy Evaluation Patient Details Name: [name] MRN: [id number] DOB: May 04, 1950 Today's Date: 01/26/2022  History of Present Illness  Pt is a 71 y/o M admitted to [facility] on 01/24/22 with PVD, LLE angiogram and abdominal aortogram performed. Additional operation of femoral bypass graft and popliteal endarterectomy 11/28. Of note, recent hospitalization (10/30-11/1) with LLE claudication tx. PMH of CABG (s/p 2021), CHF, GERD, PVD, arthritis, HTN, HLD.   Clinical Impression  Pt presents with an overall decrease in functional mobility secondary to above. PTA, pt was independent in mobility and self care, ambulating without assistive device and driving around town. Educ on importance of mobility to reduce stiffness in LLE. Today, pt able to perform sit to stand transfer minA and 20 ft ambulation with RW min guard. Pt limited by pain and fear of accepting weight through LLE. Pt would benefit from continued acute PT services to maximize functional mobility and independence prior to d/c to HHPT.        Recommendations for follow up therapy are one component of a multi-disciplinary discharge planning process, led by the attending physician.  Recommendations may be updated based on patient status, additional functional criteria and insurance authorization.  Follow Up Recommendations Home health PT      Assistance Recommended at Discharge Set up Supervision/Assistance  Patient can return home with the following  A little help with walking and/or transfers;A little help with bathing/dressing/bathroom;Assistance with cooking/housework;Assist for transportation;Help with stairs or ramp for entrance    Equipment Recommendations None recommended by PT  Recommendations for Other Services       Functional Status Assessment Patient has had a recent decline in their functional status and demonstrates the ability to make significant improvements in function in a reasonable and predictable amount  of time.     Precautions / Restrictions Precautions Precautions: Fall Restrictions Weight Bearing Restrictions: No      Mobility  Bed Mobility Overal bed mobility: Needs Assistance Bed Mobility: Supine to Sit     Supine to sit: Min assist     General bed mobility comments: minA necessary progress LLE to EOB as well as to scoot hips forward in preparation to stand    Transfers Overall transfer level: Needs assistance Equipment used: Rolling walker (2 wheels) Transfers: Sit to/from Stand Sit to Stand: Min assist           General transfer comment: minA sit to stand for full upright trunk position; pt with preference to have spouse on other side of pt due to fear    Ambulation/Gait Ambulation/Gait assistance: Min guard Gait Distance (Feet): 20 Feet Assistive device: Rolling walker (2 wheels) Gait Pattern/deviations: Step-to pattern, Decreased stance time - left, Trunk flexed, Decreased stride length Gait velocity: decreased     General Gait Details: slow cautious gait with step to gait pattern leading with LLE and decreased stance time on LLE due to pain; did not appear that pt was fully accepting weight through LLE although difficult to truly determine; able to negotiate room with RW with minimal verbal cues from therapy staff  Stairs            Wheelchair Mobility    Modified Rankin (Stroke Patients Only)       Balance Overall balance assessment: Needs assistance Sitting-balance support: Single extremity supported, Feet supported Sitting balance-Leahy Scale: Good Sitting balance - Comments: preference for RUE supported on EOB although suspect due to fear and pain and not necessary to maintain static sitting balance   Standing balance  support: Bilateral upper extremity supported, During functional activity Standing balance-Leahy Scale: Poor Standing balance comment: pt unable to remove either UE from RW during static standing or ambulation                              Pertinent Vitals/Pain Pain Assessment Pain Assessment: 0-10 Pain Score: 7  Pain Descriptors / Indicators: Cramping, Heaviness Pain Intervention(s): Monitored during session, Repositioned, Premedicated before session    [place] expects to be discharged to:: Private residence Living Arrangements: Spouse/significant other Available Help at Discharge: Family;Available 24 hours/day Type of Home: House Home Access: Stairs to enter Entrance Stairs-Rails: Can reach both Entrance Stairs-Number of Steps: 2-3   Home Layout: One level Home Equipment: Conservation officer, nature (2 wheels);Shower seat;Grab bars - toilet;Grab bars - tub/shower Additional Comments: wife is able to be at home with pt for at least a week post d/c    Prior Function Prior Level of Function : Independent/Modified Independent;Driving             Mobility Comments: ambulates without device although does have RW at home from previous CABG operation ADLs Comments: independent with IADLs and self care, wife performs majority of cooking/cleaning but husband is capable of assisting     Hand Dominance   Dominant Hand: Right    Extremity/Trunk Assessment   Upper Extremity Assessment Upper Extremity Assessment: Overall WFL for tasks assessed    Lower Extremity Assessment Lower Extremity Assessment: LLE deficits/detail LLE Deficits / Details: Pain/heaviness limiting AROM but did demonstrate AROM of hip flexion and ankle DF with increased time; pt able to WB through LLE with increased time    Cervical / Trunk Assessment Cervical / Trunk Assessment: Normal  Communication   Communication: No difficulties  Cognition Arousal/Alertness: Awake/alert Behavior During Therapy: WFL for tasks assessed/performed Overall Cognitive Status: Within Functional Limits for tasks assessed                                 General Comments: Pt AOx4, able to dual task during session  although did demonstrate some fixation on pain/heaviness of LLE. Responded to single and multi step commands correctly.        General Comments General comments (skin integrity, edema, etc.): VSS on RA; wife [name]) present and supportive    Exercises General Exercises - Lower Extremity Ankle Circles/Pumps: AROM, Both, 10 reps Hip Flexion/Marching: AROM, Both, 10 reps   Assessment/Plan    PT Assessment Patient needs continued PT services  PT Problem List Decreased strength;Decreased range of motion;Decreased activity tolerance;Decreased balance;Decreased mobility;Cardiopulmonary status limiting activity;Decreased knowledge of use of DME       PT Treatment Interventions DME instruction;Balance training;Gait training;Functional mobility training;Stair training;Therapeutic activities;Therapeutic exercise;Patient/family education;Neuromuscular re-education    PT Goals (Current goals can be found in the Care Plan section)  Acute Rehab PT Goals Patient Stated Goal: to have less pain PT Goal Formulation: With patient Time For Goal Achievement: 02/09/22 Potential to Achieve Goals: Good    Frequency Min 3X/week     Co-evaluation               AM-PAC PT "6 Clicks" Mobility  Outcome Measure Help needed turning from your back to your side while in a flat bed without using bedrails?: A Little Help needed moving from lying on your back to sitting on the side of a flat bed without using  bedrails?: A Little Help needed moving to and from a bed to a chair (including a wheelchair)?: A Little Help needed standing up from a chair using your arms (e.g., wheelchair or bedside chair)?: A Little Help needed to walk in hospital room?: A Little Help needed climbing 3-5 steps with a railing? : Total 6 Click Score: 16    End of Session Equipment Utilized During Treatment: Gait belt Activity Tolerance: Patient tolerated treatment well Patient left: in chair;with call bell/phone within  reach;with family/visitor present Nurse Communication: Mobility status PT Visit Diagnosis: Other abnormalities of gait and mobility (R26.89);Difficulty in walking, not elsewhere classified (R26.2)    Time: 8466-5993 PT Time Calculation (min) (ACUTE ONLY): 28 min   Charges:   PT Evaluation $PT Eval Moderate Complexity: 1 Mod          [id number] [place], SPT   [place] Kerigan Narvaez 01/26/2022, 10:18 AM

## 2022-01-26 NOTE — Progress Notes (Signed)
PT Cancellation Note  Patient Details Name: [name] MRN: [id number] DOB: 01-09-1951   Cancelled Treatment:    Reason Eval/Treat Not Completed: Pain limiting ability to participate; RN to give pain meds soon, will follow-up for PT Evaluation after this.  Mabeline Caras, PT, DPT Acute Rehabilitation Services  Personal: [place] [facility] Office: [place] 01/26/2022, 8:17 AM

## 2022-01-27 NOTE — Progress Notes (Signed)
Mobility Specialist Progress Note:   01/27/22 1041  Mobility  Activity Ambulated with assistance in room  Level of Assistance Contact guard assist, steadying assist  Assistive Device Front wheel walker  Distance Ambulated (ft) 40 ft  Activity Response Tolerated well  Mobility Referral Yes  $Mobility charge 1 Mobility   Pre- Mobility:  79/55 BP (asymptomatic) Post Mobility:  90/49 BP(asymptomatic)  Pt received in chair willing to participate in mobility. Complaints of LLE pain but able to put more weight through. Left in chair with call bell in reach and all needs met.   Gareth Eagle Shatyra Becka Mobility Specialist Please contact via [employer] or  Rehab Office at [phone]

## 2022-01-27 NOTE — Progress Notes (Signed)
  Progress Note    01/27/2022 9:38 AM 2 Days Post-Op  Subjective: Having left leg pain and tightness  Vitals:   01/26/22 2335 01/27/22 0310  BP: 108/72 (!) 118/59  Pulse: 74 72  Resp: 20 14  Temp: 98 F (36.7 C) 98 F (36.7 C)  SpO2: 92% 90%    Physical Exam: Awake alert oriented Nonlabored respirations Left leg incisions clean dry intact Swelling left lower extremity Left foot with brisk capillary refill and foot is very warm  CBC    Component Value Date/Time   WBC 4.2 01/26/2022 1516   RBC 3.06 (L) 01/26/2022 1516   HGB 10.5 (L) 01/26/2022 1516   HGB 11.9 (L) 12/04/2019 1022   HCT 31.9 (L) 01/26/2022 1516   HCT 35.3 (L) 12/04/2019 1022   PLT 110 (L) 01/26/2022 1516   PLT 326 12/04/2019 1022   MCV 104.2 (H) 01/26/2022 1516   MCV 89 12/04/2019 1022   MCH 34.3 (H) 01/26/2022 1516   MCHC 32.9 01/26/2022 1516   RDW 14.1 01/26/2022 1516   RDW 12.3 12/04/2019 1022   LYMPHSABS 1.0 12/04/2019 1022   MONOABS 0.7 10/24/2019 0716   EOSABS 0.5 (H) 12/04/2019 1022   BASOSABS 0.1 12/04/2019 1022    BMET    Component Value Date/Time   NA 137 01/26/2022 0540   NA 144 11/18/2020 1011   K 4.0 01/26/2022 0540   CL 105 01/26/2022 0540   CO2 25 01/26/2022 0540   GLUCOSE 101 (H) 01/26/2022 0540   BUN 15 01/26/2022 0540   BUN 19 11/18/2020 1011   CREATININE 0.80 01/26/2022 0540   CREATININE 1.12 07/23/2015 1016   CALCIUM 7.7 (L) 01/26/2022 0540   GFRNONAA >60 01/26/2022 0540   GFRAA 85 04/22/2020 1013    INR    Component Value Date/Time   INR 0.9 02/15/2021 1004     Intake/Output Summary (Last 24 hours) at 01/27/2022 0938 Last data filed at 01/27/2022 0750 Gross per 24 hour  Intake 3315.6 ml  Output 2025 ml  Net 1290.6 ml     Assessment:  71 y.o. male is status post left SFA to tibioperoneal trunk bypass with endarterectomy. 2 Days Post-Op  Plan: Blood pressure stabilized this morning and troponins are negative  Progressing as expected for bypass  with expected postoperative swelling, I elevated his leg today and recommended him to elevate whenever he is recumbent.  Continue subcutaneous heparin Will restart Eliquis prior to discharge     [name] C. Donzetta Matters, MD [facility] Office: [phone] Pager: [phone]  01/27/2022 9:38 AM

## 2022-01-28 ENCOUNTER — Encounter (INDEPENDENT_AMBULATORY_CARE_PROVIDER_SITE_OTHER): Payer: PPO

## 2022-01-28 ENCOUNTER — Ambulatory Visit (INDEPENDENT_AMBULATORY_CARE_PROVIDER_SITE_OTHER): Payer: PPO | Admitting: Nurse Practitioner

## 2022-01-28 NOTE — Progress Notes (Signed)
Physical Therapy Treatment Patient Details Name: [name] MRN: [id number] DOB: May 31, 1950 Today's Date: 01/28/2022   History of Present Illness Pt is a 71 y/o M admitted to [facility] on 01/24/22 with PVD, LLE angiogram and abdominal aortogram performed. Additional operation of femoral bypass graft and popliteal endarterectomy 11/28. Of note, recent hospitalization (10/30-11/1) with LLE claudication tx. PMH of CABG (s/p 2021), CHF, GERD, PVD, arthritis, HTN, HLD.    PT Comments    Pt is making great progress with mobility, ambulating an increased distance of up to ~182 ft with a RW at a min guard assist-supervision level without overt LOB. However, he continues to demonstrate gait deviations due to limited L ankle dorsiflexion ROM and tolerance to weight shift to L lower extremity secondary to pain. Educated pt verbally on sequencing his feet on stairs and on L lower extremity stretches. He verbalized understanding and no further questions at this time from PT. Will continue to follow acutely. Current recommendations remain appropriate.      Recommendations for follow up therapy are one component of a multi-disciplinary discharge planning process, led by the attending physician.  Recommendations may be updated based on patient status, additional functional criteria and insurance authorization.  Follow Up Recommendations  Home health PT     Assistance Recommended at Discharge Set up Supervision/Assistance  Patient can return home with the following A little help with bathing/dressing/bathroom;Assistance with cooking/housework;Assist for transportation;Help with stairs or ramp for entrance   Equipment Recommendations  None recommended by PT    Recommendations for Other Services       Precautions / Restrictions Precautions Precautions: Fall Restrictions Weight Bearing Restrictions: No     Mobility  Bed Mobility               General bed mobility comments: Pt sitting in recliner  upon arrival.    Transfers Overall transfer level: Needs assistance Equipment used: Rolling walker (2 wheels) Transfers: Sit to/from Stand Sit to Stand: Supervision           General transfer comment: Supervision for safety, no LOB    Ambulation/Gait Ambulation/Gait assistance: Min guard, Supervision Gait Distance (Feet): 182 Feet Assistive device: Rolling walker (2 wheels) Gait Pattern/deviations: Decreased stance time - left, Trunk flexed, Decreased stride length, Step-through pattern, Decreased dorsiflexion - left, Decreased weight shift to left Gait velocity: decreased Gait velocity interpretation: <1.8 ft/sec, indicate of risk for recurrent falls   General Gait Details: Pt with very minor trunk flexion, relying on UE support on RW to unweight L leg during stance. Pt with step-through gait pattern but still decreased L weight shift and stance time. Pt with good heel strike but still limited in ankle dorsiflexion throughout, needing cues to roll off L foot and push off with forefoot. Cues provided to eventually increase R step length and thus L stance time as tolerable. No LOB, min guard- supervision for safety   Stairs         General stair comments: Pt politely declined to attempt today, but verbally reviewed sequencing feet with pt leading up with R foot and down with L foot to try to increase pain tolerance. He verbalized understanding.   Wheelchair Mobility    Modified Rankin (Stroke Patients Only)       Balance Overall balance assessment: Needs assistance Sitting-balance support: No upper extremity supported, Feet supported Sitting balance-Leahy Scale: Good     Standing balance support: Bilateral upper extremity supported, During functional activity, Reliant on assistive device for balance Standing  balance-Leahy Scale: Poor Standing balance comment: Relies on RW for mobility                            Cognition Arousal/Alertness:  Awake/alert Behavior During Therapy: WFL for tasks assessed/performed Overall Cognitive Status: Within Functional Limits for tasks assessed                                          Exercises Other Exercises Other Exercises: educated pt on stretching knee into flexion and extension and ankle into dorsiflexion. He verbalized understanding and that he could perform on his own without PT supervision/guidance.    General Comments General comments (skin integrity, edema, etc.): spouse present and supportive      Pertinent Vitals/Pain Pain Assessment Pain Assessment: Faces Faces Pain Scale: Hurts a little bit Pain Location: L lower extremity Pain Descriptors / Indicators: Discomfort, Operative site guarding Pain Intervention(s): Limited activity within patient's tolerance, Monitored during session, Repositioned    Home Living                          Prior Function            PT Goals (current goals can now be found in the care plan section) Acute Rehab PT Goals Patient Stated Goal: to return to baseline eventually PT Goal Formulation: With patient Time For Goal Achievement: 02/09/22 Potential to Achieve Goals: Good Progress towards PT goals: Progressing toward goals    Frequency    Min 3X/week      PT Plan Current plan remains appropriate    Co-evaluation              AM-PAC PT "6 Clicks" Mobility   Outcome Measure  Help needed turning from your back to your side while in a flat bed without using bedrails?: A Little Help needed moving from lying on your back to sitting on the side of a flat bed without using bedrails?: A Little Help needed moving to and from a bed to a chair (including a wheelchair)?: A Little Help needed standing up from a chair using your arms (e.g., wheelchair or bedside chair)?: A Little Help needed to walk in hospital room?: A Little Help needed climbing 3-5 steps with a railing? : A Little 6 Click Score:  18    End of Session Equipment Utilized During Treatment: Gait belt Activity Tolerance: Patient tolerated treatment well Patient left: in chair;with call bell/phone within reach   PT Visit Diagnosis: Other abnormalities of gait and mobility (R26.89);Difficulty in walking, not elsewhere classified (R26.2)     Time: 3244-0102 PT Time Calculation (min) (ACUTE ONLY): 12 min  Charges:  $Gait Training: 8-22 mins                     Moishe Spice, PT, DPT Acute Rehabilitation Services  Office: [place] 01/28/2022, 2:59 PM

## 2022-01-28 NOTE — Progress Notes (Signed)
Occupational Therapy Treatment Patient Details Name: [name] MRN: [id number] DOB: 01-21-1951 Today's Date: 01/28/2022   History of present illness Pt is a 71 y/o M admitted to [facility] on 01/24/22 with PVD, LLE angiogram and abdominal aortogram performed. Additional operation of femoral bypass graft and popliteal endarterectomy 11/28. Of note, recent hospitalization (10/30-11/1) with LLE claudication tx. PMH of CABG (s/p 2021), CHF, GERD, PVD, arthritis, HTN, HLD.   OT comments  Patient progressing well towards OT goals. Completing mobility and transfers using RW with supervision today, continues to require min assist for LB dressing.  Reviewed compensatory techniques, safety and recommendations. Pt will have good support of spouse at dc.  No further acute OT needs, OT will sign off.    Recommendations for follow up therapy are one component of a multi-disciplinary discharge planning process, led by the attending physician.  Recommendations may be updated based on patient status, additional functional criteria and insurance authorization.    Follow Up Recommendations  No OT follow up     Assistance Recommended at Discharge PRN  Patient can return home with the following  A little help with walking and/or transfers;A little help with bathing/dressing/bathroom;Assistance with cooking/housework;Assist for transportation;Help with stairs or ramp for entrance   Equipment Recommendations  None recommended by OT    Recommendations for Other Services      Precautions / Restrictions Precautions Precautions: Fall Restrictions Weight Bearing Restrictions: No       Mobility Bed Mobility               General bed mobility comments: OOB in recliner upon entry    Transfers Overall transfer level: Needs assistance Equipment used: Rolling walker (2 wheels) Transfers: Sit to/from Stand Sit to Stand: Supervision                 Balance Overall balance assessment: Needs  assistance Sitting-balance support: Single extremity supported, Feet supported Sitting balance-Leahy Scale: Good     Standing balance support: Bilateral upper extremity supported, During functional activity, No upper extremity supported Standing balance-Leahy Scale: Fair Standing balance comment: relies on RW dynamically but able to engage in ADL tasks without UE support                           ADL either performed or assessed with clinical judgement   ADL Overall ADL's : Needs assistance/impaired     Grooming: Supervision/safety;Standing- washing face, brushing teeth, washing hands               Lower Body Dressing: Sit to/from stand;Minimal assistance Lower Body Dressing Details (indicate cue type and reason): min assist for donning R sock over toes but able to pull over heel and doff sock without assist today, discussed compensatory techniques and spouse will be able to assist as needed.  Supervision sit to stand Toilet Transfer: Supervision/safety;Ambulation;Rolling walker (2 wheels);Regular Toilet           Functional mobility during ADLs: Supervision/safety;Rolling walker (2 wheels)      Extremity/Trunk Assessment              Vision       Perception     Praxis      Cognition Arousal/Alertness: Awake/alert Behavior During Therapy: WFL for tasks assessed/performed Overall Cognitive Status: Within Functional Limits for tasks assessed  Exercises      Shoulder Instructions       General Comments spouse present and supportive    Pertinent Vitals/ Pain       Pain Assessment Pain Assessment: Faces Faces Pain Scale: Hurts a little bit Pain Location: L LE Pain Descriptors / Indicators: Discomfort, Operative site guarding Pain Intervention(s): Limited activity within patient's tolerance, Monitored during session, Repositioned  Home Living                                           Prior Functioning/Environment              Frequency  Min 2X/week        Progress Toward Goals  OT Goals(current goals can now be found in the care plan section)  Progress towards OT goals: Goals met/education completed, patient discharged from OT  Acute Rehab OT Goals Patient Stated Goal: home OT Goal Formulation: With patient Time For Goal Achievement: 02/09/22 Potential to Achieve Goals: Good  Plan Discharge plan remains appropriate;Frequency remains appropriate    Co-evaluation                 AM-PAC OT "6 Clicks" Daily Activity     Outcome Measure   Help from another person eating meals?: None Help from another person taking care of personal grooming?: A Little Help from another person toileting, which includes using toliet, bedpan, or urinal?: A Little Help from another person bathing (including washing, rinsing, drying)?: A Little Help from another person to put on and taking off regular upper body clothing?: A Little Help from another person to put on and taking off regular lower body clothing?: A Little 6 Click Score: 19    End of Session Equipment Utilized During Treatment: Rolling walker (2 wheels)  OT Visit Diagnosis: Other abnormalities of gait and mobility (R26.89);Pain Pain - Right/Left: Left Pain - part of body: Leg   Activity Tolerance Patient tolerated treatment well   Patient Left in chair;with call bell/phone within reach;with family/visitor present   Nurse Communication Mobility status        Time: 2591-0289 OT Time Calculation (min): 15 min  Charges: OT General Charges $OT Visit: 1 Visit OT Treatments $Self Care/Home Management : 8-22 mins  [name], OT Acute Rehabilitation Services Office [phone]   [name] 01/28/2022, 12:17 PM

## 2022-01-28 NOTE — Progress Notes (Signed)
Mobility Specialist Progress Note:   01/28/22 1446  Mobility  Activity Refused mobility   Pt currently with PT. Will follow-up as time allows.   Gareth Eagle Akshith Moncus Mobility Specialist Please contact via [employer] or  Rehab Office at [phone]

## 2022-01-28 NOTE — Progress Notes (Addendum)
  Progress Note    01/28/2022 6:38 AM 3 Days Post-Op  Subjective:  says he is doing better today.  Says he has some swelling in his leg but improves with elevation.   Afebrile HR 70's-80's  381'W-299'B systolic 71% RA  Vitals:   01/28/22 0015 01/28/22 0319  BP: (!) 105/58 118/68  Pulse: 73 74  Resp: 18 11  Temp: 98.6 F (37 C) 98 F (36.7 C)  SpO2: 94% 94%    Physical Exam: General:  sitting in chair in no distress Lungs:  non labored Incisions:  all incisions look good Extremities:  left foot is warm and well perfused.  Calf and anterior compartments are non tender   CBC    Component Value Date/Time   WBC 4.2 01/26/2022 1516   RBC 3.06 (L) 01/26/2022 1516   HGB 10.5 (L) 01/26/2022 1516   HGB 11.9 (L) 12/04/2019 1022   HCT 31.9 (L) 01/26/2022 1516   HCT 35.3 (L) 12/04/2019 1022   PLT 110 (L) 01/26/2022 1516   PLT 326 12/04/2019 1022   MCV 104.2 (H) 01/26/2022 1516   MCV 89 12/04/2019 1022   MCH 34.3 (H) 01/26/2022 1516   MCHC 32.9 01/26/2022 1516   RDW 14.1 01/26/2022 1516   RDW 12.3 12/04/2019 1022   LYMPHSABS 1.0 12/04/2019 1022   MONOABS 0.7 10/24/2019 0716   EOSABS 0.5 (H) 12/04/2019 1022   BASOSABS 0.1 12/04/2019 1022    BMET    Component Value Date/Time   NA 137 01/26/2022 0540   NA 144 11/18/2020 1011   K 4.0 01/26/2022 0540   CL 105 01/26/2022 0540   CO2 25 01/26/2022 0540   GLUCOSE 101 (H) 01/26/2022 0540   BUN 15 01/26/2022 0540   BUN 19 11/18/2020 1011   CREATININE 0.80 01/26/2022 0540   CREATININE 1.12 07/23/2015 1016   CALCIUM 7.7 (L) 01/26/2022 0540   GFRNONAA >60 01/26/2022 0540   GFRAA 85 04/22/2020 1013    INR    Component Value Date/Time   INR 0.9 02/15/2021 1004     Intake/Output Summary (Last 24 hours) at 01/28/2022 6967 Last data filed at 01/28/2022 0400 Gross per 24 hour  Intake 1313.31 ml  Output 1175 ml  Net 138.31 ml      Assessment/Plan:  71 y.o. male is s/p:  left SFA to tibioperoneal trunk bypass with  endarterectomy.   3 Days Post-Op   -pt left foot warm and well perfused. -incisions look good -continue increasing ambulation today and elevate left leg when not ambulating  -DVT prophylaxis:  sq heparin   Leontine Locket, PA-C [facility] [phone] 01/28/2022 6:38 AM  I have independently interviewed and examined patient and agree with PA assessment and plan above.  Should be okay for discharge this weekend.  Will not restart home dose anticoagulation.  Oralia Criger C. Donzetta Matters, MD [facility] [place] Office: [phone] Pager: [phone]

## 2022-01-28 NOTE — Care Management Important Message (Signed)
Important Message  Patient Details  Name: [name] MRN: [id number] Date of Birth: 08/10/1950   Medicare Important Message Given:  Yes     Shelda Altes 01/28/2022, 10:19 AM

## 2022-01-29 MED ORDER — OXYCODONE-ACETAMINOPHEN 5-325 MG PO TABS: 1.0000 | ORAL_TABLET | Freq: Four times a day (QID) | ORAL | 0 refills | Status: DC | PRN

## 2022-01-29 NOTE — Discharge Summary (Signed)
Discharge Summary     [name] 1950/05/05 71 y.o. male  [id number]  Admission Date: 01/24/2022  Discharge Date: 01/30/2022  Physician: No att. providers found  Admission Diagnosis: PAD (peripheral artery disease) ([place]) [I73.9]  HPI:   This is a 71 y.o. male with history of right carotid endarterectomy for asymptomatic disease performed several years ago and also more recently coronary artery bypass in 2021. He has been treated for left lower extremity claudication with serial stenting and lytic therapy most recently discharged from the hospital just 2 weeks ago. He now states that he has numbness of the foot really unable to walk far enough to have cramping of the calf. He is on Eliquis as well as aspirin daily. He does have a small ulcer on the posterior aspect of the left calf but no other wounds.   Hospital Course:  The patient was admitted to the hospital and taken to the [facility] lab on 01/24/2022 and underwent angiogram.         Patient name: [name] MRN: [id number]        DOB: 1950/03/31        Sex: male   01/24/2022 Pre-operative Diagnosis: Chronic left lower extremity limb threatening ischemia with rest pain Post-operative diagnosis:  Same Surgeon:  Eda Paschal. Donzetta Matters, MD Procedure Performed: 1.  Ultrasound-guided cannulation left common femoral artery 2.  Aortogram 3.  Lower extremity angiogram 4.  Moderate sedation with fentanyl and Versed for 20 minutes   Indications: 71 year old male with history of claudication has undergone multiple left lower extremity stents with occlusion and now rest pain and indicated for angiography to plan for bypass.   Findings: Aorta is ectatic with some calcification there is no evidence of flow limitation in either the aorta or the common or external iliac arteries.  The right lower extremity was not evaluated.  The left common femoral artery by ultrasound appeared to have a small dissection plane and appears that he has recurrent  disease with disease at the takeoff of the profunda.  The SFA is initially patent and then occludes at the level of the first stent in the mid SFA and he reconstitutes a below the knee popliteal artery with runoff dominant via the posterior tibial but it does appear that the peroneal and anterior tibial arteries are also patent.   Plan is for left femoral to below-knee popliteal artery bypass tomorrow in the OR and we will obtain vein mapping today.      Pt was taken to the operating room on 01/25/2022 and underwent: 1.  Harvest left greater saphenous vein 2.  Left popliteal and tibioperoneal trunk endarterectomy and removal of Viabahn stent 3.  Left SFA to tibioperoneal trunk bypass with ipsilateral, nonreversed, translocated greater saphenous vein    Findings: The SFA was soft and with very strong pulse and was used for inflow given previous left common femoral endarterectomy with significant scarring. The greater saphenous vein was easily dilated to over 4 mm throughout its course and was used in a nonreversed fashion as a bypass conduit. 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 I did remove the most distal Viabahn stent given that there was acute thrombus within this but there was no antegrade bleeding from the distal popliteal artery. At completion there were posterior  tibial anterior tibial and peroneal artery signals at the ankle that were graft dependent.   The pt tolerated the procedure well and was transported to the PACU in good condition.   By POD 1, he had a strong left DP and PT doppler signal and foot was warm.  Later that day, he had an episode of hypotension and received IVF bolus.  His IV pain medication was held.    POD 2, pt was doing  well.  He was having expected postoperative swelling.  He was instructed to elevate his leg.   POD 3, pt doing well with foot warm and well perfused.  It was decided since he had bypass grafting and he was on Eliquis for his stents, we would not restart this post operatively.  He increased mobilization.   POD 4, pt doing well with brisk doppler flow left foot.  Incisions healing nicely.  He was discharged home with plans for f/u in a couple of weeks for wound check and in 3 months with LLE arterial duplex/ABI. He has hx of carotid endarterectomy and wound like to transfer care to our office. He was scheduled for his carotid duplex with other studies in February in [place] and he has cancelled this appt. We will schedule him for follow up for these in 3 months.   CBC    Component Value Date/Time   WBC 4.2 01/26/2022 1516   RBC 3.06 (L) 01/26/2022 1516   HGB 10.5 (L) 01/26/2022 1516   HGB 11.9 (L) 12/04/2019 1022   HCT 31.9 (L) 01/26/2022 1516   HCT 35.3 (L) 12/04/2019 1022   PLT 110 (L) 01/26/2022 1516   PLT 326 12/04/2019 1022   MCV 104.2 (H) 01/26/2022 1516   MCV 89 12/04/2019 1022   MCH 34.3 (H) 01/26/2022 1516   MCHC 32.9 01/26/2022 1516   RDW 14.1 01/26/2022 1516   RDW 12.3 12/04/2019 1022   LYMPHSABS 1.0 12/04/2019 1022   MONOABS 0.7 10/24/2019 0716   EOSABS 0.5 (H) 12/04/2019 1022   BASOSABS 0.1 12/04/2019 1022    BMET    Component Value Date/Time   NA 137 01/26/2022 0540   NA 144 11/18/2020 1011   K 4.0 01/26/2022 0540   CL 105 01/26/2022 0540   CO2 25 01/26/2022 0540   GLUCOSE 101 (H) 01/26/2022 0540   BUN 15 01/26/2022 0540   BUN 19 11/18/2020 1011   CREATININE 0.80 01/26/2022 0540   CREATININE 1.12 07/23/2015 1016   CALCIUM 7.7 (L) 01/26/2022 0540   GFRNONAA >60 01/26/2022 0540   GFRAA 85 04/22/2020 1013     Discharge Instructions     Discharge patient   Complete by: As directed    Discharge home after Dr. Virl Cagey sees pt.   Discharge disposition:  01-Home or Self Care   Discharge patient date: 01/29/2022       Discharge Diagnosis:  PAD (peripheral artery disease) ([place]) [I73.9]  Secondary Diagnosis: Patient Active Problem List   Diagnosis Date Noted   Ischemic leg 12/27/2021   Arterial occlusion    Ischemia of left lower extremity 02/15/2021   Hyperlipidemia LDL goal <70 02/15/2021   CAD (coronary artery disease) 02/15/2021   Chronic diastolic CHF (congestive heart failure) ([place]) 02/15/2021   Alcohol use 02/15/2021   AKI (acute kidney injury) ([place]) 11/20/2020   Gouty arthritis of great toe 10/14/2020   Ischemia of extremity 09/28/2020   Postoperative atrial fibrillation ([place]) 05/21/2020   Drug-induced myopathy 05/05/2020   Acquired thrombophilia ([place]) 01/08/2020  Post-operative state 12/18/2019   Postop check 11/27/2019   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   Accelerating angina ([place]) 11/05/2019   Left bundle branch block 11/05/2019   Shortness of breath    Stricture and stenosis of esophagus    Polyp of colon    Rectal polyp    CAD, multiple vessel 08/13/2018   PAD (peripheral artery disease) ([place]) 08/13/2018   Carotid stenosis 07/17/2018   Atherosclerosis of artery of extremity with rest pain ([place]) 06/04/2018   Chronic HFrEF (heart failure with reduced ejection fraction) ([place]) 03/12/2018   Nonischemic cardiomyopathy ([place]) 12/08/2017   Myalgia due to statin 12/08/2017   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 ([place]) 05/01/2017   Foot pain, bilateral 05/01/2017   Degenerative disc disease, 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) 07/13/2016   Dysphagia 11/26/2015   Chronic anticoagulation: plavix 10/15/2015   Chronic left hip pain 09/15/2015   Essential hypertension 08/08/2015   Coronary artery disease of native artery of native heart with stable angina pectoris ([place])    Mixed hyperlipidemia 01/29/2013   History of  CEA (carotid endarterectomy) 11/08/2011   Past Medical History:  Diagnosis Date   Abnormal nuclear cardiac imaging test 08/08/2015   AICD (automatic cardioverter/defibrillator) present    pacemaker/defib [employer]   Arthritis    fingers   Carotid artery occlusion    CHF (congestive heart failure) (H[facility])    Coronary atherosclerosis of native coronary artery 01/29/2013   11/05/19 R/LHC 80% dLMCA stenosis small diffusely dz dLAD, chronically occluded OM1 50%mRCA lesion, widely patent mLCx strent, moderately elevated L heart filling pressures, mild to moderate RH filling pressures, normal to moderately reduced CO   Duodenal erosion    Encounter for screening for lung cancer 07/13/2016   Esophageal stenosis    esophageal dilation   GERD (gastroesophageal reflux disease)    H. pylori infection    Heart attack ([place]) 11/2007   Mild   Hiatal hernia    Hyperlipidemia    Hypertension    Old myocardial infarction 11/29/2007   Mildly elevated troponin, isolated value in October 2009. Cardiac catheterization-nonobstructive 60% RCA disease-subsequent nuclear stress test-9 minutes, low risk, mild inferior wall hypokinesis    Pain in limb 12/19/2017   Peripheral vascular disease ([place])    Unstable angina ([place]) 11/25/2017     Allergies as of 01/29/2022       Reactions   Brilinta [ticagrelor] Shortness Of Breath   Chlorhexidine Gluconate Other (See Comments)   Skin burning for hours afterward   Contrast Media [iodinated Contrast Media] Itching   Face and head flushing, nose itching after contrast administation for angiogram   Statins Other (See Comments)   Failed Crestor 5 mg twice weekly, Crestor 20 mg daily, Pravastatin 40 mg qd, Lipitor, Zocor - muscle aches   Zetia [ezetimibe] Other (See Comments)   Muscle aches        Medication List     STOP taking these medications    Eliquis 5 MG Tabs tablet Generic drug: apixaban       TAKE these medications    aspirin EC 81 MG  tablet Take 1 tablet (81 mg total) by mouth daily.   carvedilol 12.5 MG tablet Commonly known as: COREG TAKE (1) TABLET BY MOUTH TWICE DAILY   colchicine 0.6 MG tablet TAKE 1 TABLET BY MOUTH 2 TIMES DAILY. What changed:  when to take this reasons to  take this   mexiletine 150 MG capsule Commonly known as: MEXITIL Take 1 capsule (150 mg total) by mouth 2 (two) times daily.   nitroGLYCERIN 0.4 MG SL tablet Commonly known as: Nitrostat Place 1 tablet (0.4 mg total) under the tongue every 5 (five) minutes as needed for chest pain.   oxyCODONE-acetaminophen 5-325 MG tablet Commonly known as: Percocet Take 1 tablet by mouth every 6 (six) hours as needed.   Praluent 150 MG/ML [place] Generic drug: Alirocumab Inject 150 mg into the skin every 14 (fourteen) days.   sacubitril-valsartan 49-51 MG Commonly known as: ENTRESTO Take 1 tablet by mouth 2 (two) times daily.   traZODone 50 MG tablet Commonly known as: DESYREL Take 1 tablet (50 mg total) by mouth at bedtime as needed. for sleep        Discharge Instructions: [facility] Discharge instructions Lower Extremity Bypass Surgery  Please refer to the following instruction for your post-procedure care. Your surgeon or physician assistant will discuss any changes with you.  Activity  You are encouraged to walk as much as you can. You can slowly return to normal activities during the month after your surgery. Avoid strenuous activity and heavy lifting until your doctor tells you it's OK. Avoid activities such as vacuuming or swinging a golf club. Do not drive until your doctor give the OK and you are no longer taking prescription pain medications. It is also normal to have difficulty with sleep habits, eating and bowel movement after surgery. These will go away with time.  Bathing/Showering  You may shower after you go home. Do not soak in a bathtub, hot tub, or swim until the incision heals  completely.  Incision Care  Clean your incision with mild soap and water. Shower every day. Pat the area dry with a clean towel. You do not need a bandage unless otherwise instructed. Do not apply any ointments or creams to your incision. If you have open wounds you will be instructed how to care for them or a visiting nurse may be arranged for you. If you have staples or sutures along your incision they will be removed at your post-op appointment. You may have skin glue on your incision. Do not peel it off. It will come off on its own in about one week.  Wash the groin wound with soap and water daily and pat dry. (No tub bath-only shower)  Then put a dry gauze or washcloth in the groin to keep this area dry to help prevent wound infection.  Do this daily and as needed.  Do not use Vaseline or neosporin on your incisions.  Only use soap and water on your incisions and then protect and keep dry.  Diet  Resume your normal diet. There are no special food restrictions following this procedure. A low fat/ low cholesterol diet is recommended for all patients with vascular disease. In order to heal from your surgery, it is CRITICAL to get adequate nutrition. Your body requires vitamins, minerals, and protein. Vegetables are the best source of vitamins and minerals. Vegetables also provide the perfect balance of protein. Processed food has little nutritional value, so try to avoid this.  Medications  Resume taking all your medications unless your doctor or Physician Assistant tells you not to. If your incision is causing pain, you may take over-the-counter pain relievers such as acetaminophen (Tylenol). If you were prescribed a stronger pain medication, please aware these medication can cause nausea and constipation. Prevent  nausea by taking the medication with a snack or meal. Avoid constipation by drinking plenty of fluids and eating foods with high amount of fiber, such as fruits, vegetables, and grains.  Take Colace 100 mg (an over-the-counter stool softener) twice a day as needed for constipation.  Do not take Tylenol if you are taking prescription pain medications.  Follow Up  Our office will schedule a follow up appointment 2-3 weeks following discharge.  Please call us immediately for any of the following conditions  Severe or worsening pain in your legs or feet while at rest or while walking Increase pain, redness, warmth, or drainage (pus) from your incision site(s) Fever of 101 degree or higher The swelling in your leg with the bypass suddenly worsens and becomes more painful than when you were in the hospital If you have been instructed to feel your graft pulse then you should do so every day. If you can no longer feel this pulse, call the office immediately. Not all patients are given this instruction.  Leg swelling is common after leg bypass surgery.  The swelling should improve over a few months following surgery. To improve the swelling, you may elevate your legs above the level of your heart while you are sitting or resting. Your surgeon or physician assistant may ask you to apply an ACE wrap or wear compression (TED) stockings to help to reduce swelling.  Reduce your risk of vascular disease  Stop smoking. If you would like help call [facility] at 1-800-QUIT-NOW 202-592-9292) or [place] at [phone].  Manage your cholesterol Maintain a desired weight Control your diabetes weight Control your diabetes Keep your blood pressure down  If you have any questions, please call the office at [phone]   Prescriptions given: 1.  Roxicet #20 No Refill   Disposition: home  Patient's condition: is Good  Follow up: 1. [facility] in 2-3 weeks for incision check on Dr. Donzetta Matters clinic day 2.  [facility] in 3 months with LLE arterial duplex, ABI and carotid duplex on Dr. Donzetta Matters clinic day  (message sent for both)   Leontine Locket, PA-C [facility] [phone] 01/30/2022  10:13 AM  - For VQI Registry use ---   Post-op:  Wound infection: No  Graft infection: No  Transfusion: No    If yes, n/a units given New Arrhythmia: No Ipsilateral amputation: No, '[ ]'$  Minor, '[ ]'$  BKA, '[ ]'$  AKA Discharge patency: [x ] Primary, '[ ]'$  Primary assisted, '[ ]'$  Secondary, '[ ]'$  Occluded Patency judged by: [x ] Dopper only, '[ ]'$  Palpable graft pulse, '[]'$  Palpable distal pulse, '[ ]'$  ABI inc. > 0.15, '[ ]'$  Duplex Discharge ABI: R not done, L  D/C Ambulatory Status: Ambulatory  Complications: MI: No, '[ ]'$  Troponin only, '[ ]'$  EKG or Clinical CHF: No Resp failure:No, '[ ]'$  Pneumonia, '[ ]'$  Ventilator Chg in renal function: No, '[ ]'$  Inc. Cr > 0.5, '[ ]'$  Temp. Dialysis,  '[ ]'$  Permanent dialysis Stroke: No, '[ ]'$  Minor, '[ ]'$  Major Return to OR: No  Reason for return to OR: '[ ]'$  Bleeding, '[ ]'$  Infection, '[ ]'$  Thrombosis, '[ ]'$  Revision  Discharge medications: Statin use:  yes ASA use:  yes Plavix use:  no Beta blocker use: yes CCB use:  No ACEI use:   no ARB use:  yes Coumadin use: no

## 2022-01-29 NOTE — Care Management (Signed)
  Transition of Care (TOC) Screening Note   Patient Details  Name: [name] Date of Birth: 1950-12-24   Transition of Care [facility]) CM/SW Contact:    Bethena Roys, RN Phone Number: 01/29/2022, 10:37 AM    Transition of Care Department [facility]) has reviewed the patient and no TOC needs have been identified at this time. Latricia Heft)- TCTS office referral for [facility] protocol- will contact the patient to schedule a visit post discharge. Case Manager did speak with spouse and she is aware. No further home needs identified.

## 2022-01-29 NOTE — Progress Notes (Signed)
Mobility Specialist - Progress Note   01/29/22 1148  Mobility  Activity Ambulated with assistance in room  Level of Assistance Contact guard assist, steadying assist  Assistive Device Front wheel walker  Distance Ambulated (ft) 20 ft  Activity Response Tolerated well  Mobility Referral Yes  $Mobility charge 1 Mobility   Pt received in chair and agreeable to mobility. Session Limited d/t pt being discharged soon. Pt was left in chair with all needs met.  Franki Monte  Mobility Specialist Please contact via Solicitor or Rehab office at [phone]

## 2022-01-29 NOTE — Progress Notes (Cosign Needed Addendum)
  Progress Note    01/29/2022 7:21 AM 4 Days Post-Op  Subjective:  says he has some gout in the left foot.  Took meds this morning and pain starting to improve.  He has had this before in the same place.  Wants to go home.  Says his leg is feeling better.  Feels good to elevate leg  Afebrile HR 70's-80's  790'W-409'B systolic 35% RA  Vitals:   01/28/22 2356 01/29/22 0450  BP: 132/70 (!) 133/95  Pulse: 81 80  Resp: 18 18  Temp: 98.2 F (36.8 C) 98 F (36.7 C)  SpO2: 94% 95%    Physical Exam: General:  no distress Cardiac:  regular Lungs:  non labored Incisions:  all incisions healing nicely Extremities:  brisk doppler flow left foot   CBC    Component Value Date/Time   WBC 4.2 01/26/2022 1516   RBC 3.06 (L) 01/26/2022 1516   HGB 10.5 (L) 01/26/2022 1516   HGB 11.9 (L) 12/04/2019 1022   HCT 31.9 (L) 01/26/2022 1516   HCT 35.3 (L) 12/04/2019 1022   PLT 110 (L) 01/26/2022 1516   PLT 326 12/04/2019 1022   MCV 104.2 (H) 01/26/2022 1516   MCV 89 12/04/2019 1022   MCH 34.3 (H) 01/26/2022 1516   MCHC 32.9 01/26/2022 1516   RDW 14.1 01/26/2022 1516   RDW 12.3 12/04/2019 1022   LYMPHSABS 1.0 12/04/2019 1022   MONOABS 0.7 10/24/2019 0716   EOSABS 0.5 (H) 12/04/2019 1022   BASOSABS 0.1 12/04/2019 1022    BMET    Component Value Date/Time   NA 137 01/26/2022 0540   NA 144 11/18/2020 1011   K 4.0 01/26/2022 0540   CL 105 01/26/2022 0540   CO2 25 01/26/2022 0540   GLUCOSE 101 (H) 01/26/2022 0540   BUN 15 01/26/2022 0540   BUN 19 11/18/2020 1011   CREATININE 0.80 01/26/2022 0540   CREATININE 1.12 07/23/2015 1016   CALCIUM 7.7 (L) 01/26/2022 0540   GFRNONAA >60 01/26/2022 0540   GFRAA 85 04/22/2020 1013    INR    Component Value Date/Time   INR 0.9 02/15/2021 1004     Intake/Output Summary (Last 24 hours) at 01/29/2022 0721 Last data filed at 01/29/2022 0451 Gross per 24 hour  Intake 120 ml  Output 1500 ml  Net -1380 ml      Assessment/Plan:  71  y.o. male is s/p:  left SFA to tibioperoneal trunk bypass with endarterectomy.    4 Days Post-Op   -pt doing well with brisk doppler flow left foot -incisions are healing nicely.   -will discharge home today -DVT prophylaxis:  sq heparin -f/u in a couple of weeks for wound check and in 3 months with LLE arterial duplex/ABI.  He has hx of carotid endarterectomy and wound like to transfer care to our office.  He was scheduled for his carotid duplex with other studies in February in [place] and he has cancelled this appt.  We will schedule him for follow up for these in 3 months.    -per Dr. Donzetta Matters, will not restart his home Eliquis as it was for stent patency.   Leontine Locket, PA-C [facility] [phone] 01/29/2022 7:21 AM

## 2022-02-08 ENCOUNTER — Telehealth: Payer: Self-pay

## 2022-02-08 NOTE — Telephone Encounter (Signed)
Pt called stating that his lower incision is red and sore. He states that "it looks infected". He was requesting an antibiotic to be called in since he lives in [place]. He also offered to send in a picture of the site.  Reviewed pt's chart, returned call for clarification, two identifiers used. Pt stated that he noticed the incision approx 1 wk ago. There is a hard, red area around the entirety of the incision. He denies the incision being open or draining. He states it is very tender to the touch. He has been washing it with soap and water and last night he applied some Neosporin. Post-op appt moved from 12/20 to 12/13 d/t symptoms and 2 wks from surgery date. Confirmed understanding.

## 2022-02-09 ENCOUNTER — Ambulatory Visit (INDEPENDENT_AMBULATORY_CARE_PROVIDER_SITE_OTHER): Payer: PPO | Admitting: Physician Assistant

## 2022-02-09 VITALS — BP 106/75 | HR 73 | Temp 97.7°F | Ht 67.0 in | Wt 178.0 lb

## 2022-02-09 DIAGNOSIS — I739 Peripheral vascular disease, unspecified: Secondary | ICD-10-CM

## 2022-02-09 DIAGNOSIS — Z9889 Other specified postprocedural states: Secondary | ICD-10-CM

## 2022-02-09 MED ORDER — SULFAMETHOXAZOLE-TRIMETHOPRIM 400-80 MG PO TABS: 1.0000 | ORAL_TABLET | Freq: Two times a day (BID) | ORAL | 0 refills | Status: DC

## 2022-02-09 NOTE — Progress Notes (Signed)
POST OPERATIVE OFFICE NOTE    CC:  F/u for surgery  HPI:  This is a 71 y.o. male who is s/p left popliteal and TP trunk endarterectomy with left SFA to TP trunk bypass with vein by Dr. Donzetta Matters on 01/25/2022.  This was performed due to rest pain of the left foot.  Patient states he has been doing well postoperatively until he developed some redness and swelling around his distalmost incision of his left leg.  He said it is painful to touch however has not had any drainage.  All other incisions are healing well.  He is on aspirin daily.  He denies any fevers, chills, nausea/vomiting.  Allergies  Allergen Reactions   Brilinta [Ticagrelor] Shortness Of Breath   Chlorhexidine Gluconate Other (See Comments)    Skin burning for hours afterward   Contrast Media [Iodinated Contrast Media] Itching    Face and head flushing, nose itching after contrast administation for angiogram   Statins Other (See Comments)    Failed Crestor 5 mg twice weekly, Crestor 20 mg daily, Pravastatin 40 mg qd, Lipitor, Zocor - muscle aches   Zetia [Ezetimibe] Other (See Comments)    Muscle aches    Current Outpatient Medications  Medication Sig Dispense Refill   aspirin EC 81 MG tablet Take 1 tablet (81 mg total) by mouth daily. 150 tablet 2   carvedilol (COREG) 12.5 MG tablet TAKE (1) TABLET BY MOUTH TWICE DAILY 180 tablet 1   colchicine 0.6 MG tablet TAKE 1 TABLET BY MOUTH 2 TIMES DAILY. (Patient taking differently: Take 0.6 mg by mouth 2 (two) times daily as needed (gout attacks).) 30 tablet 0   mexiletine (MEXITIL) 150 MG capsule Take 1 capsule (150 mg total) by mouth 2 (two) times daily. 180 capsule 3   nitroGLYCERIN (NITROSTAT) 0.4 MG SL tablet Place 1 tablet (0.4 mg total) under the tongue every 5 (five) minutes as needed for chest pain. 25 tablet 2   oxyCODONE-acetaminophen (PERCOCET) 5-325 MG tablet Take 1 tablet by mouth every 6 (six) hours as needed. 20 tablet 0   PRALUENT 150 MG/ML SOAJ Inject 150 mg into the  skin every 14 (fourteen) days. 6 mL 3   sacubitril-valsartan (ENTRESTO) 49-51 MG Take 1 tablet by mouth 2 (two) times daily. 180 tablet 3   sulfamethoxazole-trimethoprim (BACTRIM) 400-80 MG tablet Take 1 tablet by mouth 2 (two) times daily. 28 tablet 0   traZODone (DESYREL) 50 MG tablet Take 1 tablet (50 mg total) by mouth at bedtime as needed. for sleep 90 tablet 1   No current facility-administered medications for this visit.     ROS:  See HPI  Physical Exam:  Vitals:   02/09/22 1252  BP: 106/75  Pulse: 73  Temp: 97.7 F (36.5 C)  TempSrc: Temporal  SpO2: 95%  Weight: 178 lb (80.7 kg)  Height: '5\' 7"'$  (1.702 m)    Incision:   Extremities: Left calf soft; L PT, DP, and peroneal signal by Doppler Neuro: A&O  Assessment/Plan:  This is a 71 y.o. male who is s/p: Left popliteal and TP trunk endarterectomy with SFA to TP trunk bypass with vein  -Left foot well-perfused by Doppler exam and rest pain has resolved since surgery -He is experiencing pain at his distalmost incision as well as his calf with surrounding erythema.  Fortunately bypass was performed with his own vein.  We will try to first manage this conservatively.  He will be started on antibiotics.  He will follow-up in 1 week.  We discussed elevating the leg above the level of the heart when he is not moving around during the day.  He knows to call/return office sooner with any questions or concerns.   Dagoberto Ligas, PA-C [facility] [phone]  Clinic MD:  Donzetta Matters

## 2022-02-11 DIAGNOSIS — Z131 Encounter for screening for diabetes mellitus: Secondary | ICD-10-CM | POA: Diagnosis not present

## 2022-02-15 NOTE — Progress Notes (Unsigned)
  POST OPERATIVE OFFICE NOTE    CC:  F/u for surgery  HPI:  This is a 71 y.o. male who is s/p  left popliteal and TPT endarterectomy and removal of Viabahn stent, left SFA to TPT bypass with ipsilateral, non reversed translocated GSV on 01/25/2022 by Dr. Donzetta Matters for chronic limb threatening ischemia with rest pain.    Pt was seen on 02/09/2022 as he had developed some redness and swelling around the most distal incision that was painful to touch but no drainage.  He was not having any fever/chills/N/V.  He was started on abx and scheduled for one week follow up.  He was instructed on leg elevation when not ambulating.    Pt is on asa and Praluent.  Pt returns today for follow up.  Pt states ***   Allergies  Allergen Reactions   Brilinta [Ticagrelor] Shortness Of Breath   Chlorhexidine Gluconate Other (See Comments)    Skin burning for hours afterward   Contrast Media [Iodinated Contrast Media] Itching    Face and head flushing, nose itching after contrast administation for angiogram   Statins Other (See Comments)    Failed Crestor 5 mg twice weekly, Crestor 20 mg daily, Pravastatin 40 mg qd, Lipitor, Zocor - muscle aches   Zetia [Ezetimibe] Other (See Comments)    Muscle aches    Current Outpatient Medications  Medication Sig Dispense Refill   aspirin EC 81 MG tablet Take 1 tablet (81 mg total) by mouth daily. 150 tablet 2   carvedilol (COREG) 12.5 MG tablet TAKE (1) TABLET BY MOUTH TWICE DAILY 180 tablet 1   colchicine 0.6 MG tablet TAKE 1 TABLET BY MOUTH 2 TIMES DAILY. (Patient taking differently: Take 0.6 mg by mouth 2 (two) times daily as needed (gout attacks).) 30 tablet 0   mexiletine (MEXITIL) 150 MG capsule Take 1 capsule (150 mg total) by mouth 2 (two) times daily. 180 capsule 3   nitroGLYCERIN (NITROSTAT) 0.4 MG SL tablet Place 1 tablet (0.4 mg total) under the tongue every 5 (five) minutes as needed for chest pain. 25 tablet 2   oxyCODONE-acetaminophen (PERCOCET) 5-325 MG  tablet Take 1 tablet by mouth every 6 (six) hours as needed. 20 tablet 0   PRALUENT 150 MG/ML SOAJ Inject 150 mg into the skin every 14 (fourteen) days. 6 mL 3   sacubitril-valsartan (ENTRESTO) 49-51 MG Take 1 tablet by mouth 2 (two) times daily. 180 tablet 3   sulfamethoxazole-trimethoprim (BACTRIM) 400-80 MG tablet Take 1 tablet by mouth 2 (two) times daily. 28 tablet 0   traZODone (DESYREL) 50 MG tablet Take 1 tablet (50 mg total) by mouth at bedtime as needed. for sleep 90 tablet 1   No current facility-administered medications for this visit.     ROS:  See HPI  Physical Exam:  ***  Incision:  *** Extremities:  *** Neuro: *** Abdomen:  ***    Assessment/Plan:  This is a 71 y.o. male who is s/p:  left popliteal and TPT endarterectomy and removal of Viabahn stent, left SFA to TPT bypass with ipsilateral, non reversed translocated GSV on 01/25/2022 by Dr. Donzetta Matters for chronic limb threatening ischemia with rest pain.     -***   Leontine Locket, [facility] [phone]   Clinic MD:  Donzetta Matters

## 2022-02-16 ENCOUNTER — Ambulatory Visit (INDEPENDENT_AMBULATORY_CARE_PROVIDER_SITE_OTHER): Payer: PPO | Admitting: Physician Assistant

## 2022-02-16 VITALS — BP 112/78 | HR 74 | Temp 98.1°F | Resp 20 | Ht 67.0 in | Wt 178.2 lb

## 2022-02-16 DIAGNOSIS — I739 Peripheral vascular disease, unspecified: Secondary | ICD-10-CM

## 2022-02-16 MED ORDER — OXYCODONE-ACETAMINOPHEN 5-325 MG PO TABS: 1.0000 | ORAL_TABLET | Freq: Three times a day (TID) | ORAL | 0 refills | Status: AC | PRN

## 2022-02-16 MED ORDER — AMOXICILLIN-POT CLAVULANATE 875-125 MG PO TABS: 1.0000 | ORAL_TABLET | Freq: Two times a day (BID) | ORAL | 0 refills | Status: DC

## 2022-03-01 ENCOUNTER — Encounter (INDEPENDENT_AMBULATORY_CARE_PROVIDER_SITE_OTHER): Payer: PPO

## 2022-03-01 ENCOUNTER — Ambulatory Visit (INDEPENDENT_AMBULATORY_CARE_PROVIDER_SITE_OTHER): Payer: PPO | Admitting: Vascular Surgery

## 2022-03-02 ENCOUNTER — Ambulatory Visit (INDEPENDENT_AMBULATORY_CARE_PROVIDER_SITE_OTHER): Payer: PPO | Admitting: Physician Assistant

## 2022-03-02 VITALS — BP 98/67 | HR 70 | Temp 97.8°F | Wt 179.0 lb

## 2022-03-02 DIAGNOSIS — I70222 Atherosclerosis of native arteries of extremities with rest pain, left leg: Secondary | ICD-10-CM

## 2022-03-02 NOTE — Progress Notes (Signed)
POST OPERATIVE OFFICE NOTE    CC:  F/u for surgery  HPI:  Brandyn Lowrey is a 72 y.o. male who is s/p left popliteal and TPT endarterectomy with removal of Viabahn stent, and left SFA to TPT bypass with ipsilateral, nonreversed translocated GSV on 01/25/2022 by Dr. Donzetta Matters.  This was done for left lower extremity chronic limb threatening ischemia with rest pain.  Postoperatively he developed some redness and swelling of the most distal leg incision that was concerning for infection.  The area was painful to the touch and had some clear fluid drainage.  He was originally placed on Bactrim with minor improvement.  At his last visit with [place] he was switched to Augmentin for 10 days.  It was also recommended that he obtain a vive sock to help with swelling and healing.  At follow-up today he is doing well and believes that his most distal incision has fully healed.  His swelling and redness resolved about a week and a half ago.  He denies any further drainage of the incision.  He has finished his course of Augmentin without issue.  All of his other incisions have fully healed and the swelling in his left thigh is much better.  He has been wearing his Vive sock daily and thinks that helps with healing.  He denies any rest pain or claudication in the left lower extremity.    Allergies  Allergen Reactions   Brilinta [Ticagrelor] Shortness Of Breath   Chlorhexidine Gluconate Other (See Comments)    Skin burning for hours afterward   Contrast Media [Iodinated Contrast Media] Itching    Face and head flushing, nose itching after contrast administation for angiogram   Statins Other (See Comments)    Failed Crestor 5 mg twice weekly, Crestor 20 mg daily, Pravastatin 40 mg qd, Lipitor, Zocor - muscle aches   Zetia [Ezetimibe] Other (See Comments)    Muscle aches    Current Outpatient Medications  Medication Sig Dispense Refill   amoxicillin-clavulanate (AUGMENTIN) 875-125 MG tablet Take 1 tablet by mouth 2  (two) times daily. 20 tablet 0   aspirin EC 81 MG tablet Take 1 tablet (81 mg total) by mouth daily. 150 tablet 2   carvedilol (COREG) 12.5 MG tablet TAKE (1) TABLET BY MOUTH TWICE DAILY 180 tablet 1   colchicine 0.6 MG tablet TAKE 1 TABLET BY MOUTH 2 TIMES DAILY. (Patient taking differently: Take 0.6 mg by mouth 2 (two) times daily as needed (gout attacks).) 30 tablet 0   mexiletine (MEXITIL) 150 MG capsule Take 1 capsule (150 mg total) by mouth 2 (two) times daily. 180 capsule 3   nitroGLYCERIN (NITROSTAT) 0.4 MG SL tablet Place 1 tablet (0.4 mg total) under the tongue every 5 (five) minutes as needed for chest pain. 25 tablet 2   oxyCODONE-acetaminophen (PERCOCET) 5-325 MG tablet Take 1 tablet by mouth every 8 (eight) hours as needed. 20 tablet 0   PRALUENT 150 MG/ML SOAJ Inject 150 mg into the skin every 14 (fourteen) days. 6 mL 3   sacubitril-valsartan (ENTRESTO) 49-51 MG Take 1 tablet by mouth 2 (two) times daily. 180 tablet 3   sulfamethoxazole-trimethoprim (BACTRIM) 400-80 MG tablet Take 1 tablet by mouth 2 (two) times daily. 28 tablet 0   traZODone (DESYREL) 50 MG tablet Take 1 tablet (50 mg total) by mouth at bedtime as needed. for sleep 90 tablet 1   No current facility-administered medications for this visit.     ROS:  See HPI  Physical  Exam:  Incision: Left lower extremity incisions fully healed with no erythema or swelling. Extremities: Brisk biphasic left PT/peroneal Doppler signal.  Trace left lower extremity edema Neuro: intact motor and sensation of LLE    Assessment/Plan:  This is a 72 y.o. male who is s/p: Left popliteal and TPT endarterectomy with removal of Viabahn stent, and left SFA to TPT bypass with ipsilateral nonreversed translocated GSV on 01/25/2022   -His left lower extremity is well-perfused with biphasic PT/peroneal Doppler signals -His left lower extremity swelling has greatly improved with the use of compression sock.  His incisions are all now fully  healed with no signs of infection.  He finished his course of Augmentin without issue -He no longer has rest pain in the left lower extremity.  He still has some numbness in the left foot, likely due to prolonged limb ischemia prior to bypass -He will continue his daily aspirin.  He has an appointment scheduled with our office in March for ABIs + carotid duplex + left lower extremity bypass graft duplex   Vicente Serene, PA-C [facility] [phone]   Clinic MD:  Donzetta Matters

## 2022-03-05 ENCOUNTER — Encounter: Payer: Self-pay | Admitting: Internal Medicine

## 2022-03-06 ENCOUNTER — Other Ambulatory Visit: Payer: Self-pay | Admitting: Internal Medicine

## 2022-03-06 DIAGNOSIS — F5101 Primary insomnia: Secondary | ICD-10-CM

## 2022-03-06 MED ORDER — TRAZODONE HCL 50 MG PO TABS: 50.0000 mg | ORAL_TABLET | Freq: Every evening | ORAL | 1 refills | Status: DC | PRN

## 2022-03-07 ENCOUNTER — Other Ambulatory Visit: Payer: Self-pay

## 2022-03-07 DIAGNOSIS — I739 Peripheral vascular disease, unspecified: Secondary | ICD-10-CM

## 2022-03-07 DIAGNOSIS — I70222 Atherosclerosis of native arteries of extremities with rest pain, left leg: Secondary | ICD-10-CM

## 2022-03-07 DIAGNOSIS — I6523 Occlusion and stenosis of bilateral carotid arteries: Secondary | ICD-10-CM

## 2022-03-14 DIAGNOSIS — M545 Low back pain, unspecified: Secondary | ICD-10-CM | POA: Diagnosis not present

## 2022-03-14 DIAGNOSIS — M9903 Segmental and somatic dysfunction of lumbar region: Secondary | ICD-10-CM | POA: Diagnosis not present

## 2022-03-21 ENCOUNTER — Encounter: Payer: Self-pay | Admitting: Pharmacist

## 2022-03-21 NOTE — Progress Notes (Signed)
[facility])                                            [place] Team                                        Statin Quality Measure Assessment    03/21/2022  RAMEL TOBON [birth year] [id number]   I am a [facility] clinical pharmacist that reviews patients for statin quality initiatives.     Per review of chart and payor information, patient has a diagnosis of cardiovascular disease but is not currently filling a statin prescription.  This places patient into the [facility] (Statin Use In Patients with Cardiovascular Disease) measure for CMS.    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 Treated with PCSK9i.      Component Value Date/Time   CHOL 127 03/18/2021 1110   TRIG 111 03/18/2021 1110   HDL 63 03/18/2021 1110   CHOLHDL 2.0 03/18/2021 1110   CHOLHDL 2.3 01/07/2020 1601   VLDL 32 01/07/2020 1601   LDLCALC 44 03/18/2021 1110     Please consider ONE of the following recommendations:  Initiate high intensity statin Atorvastatin '40mg'$  once daily, #90, 3 refills   Rosuvastatin '20mg'$  once daily, #90, 3 refills    Initiate moderate intensity  statin with reduced frequency if prior  statin intolerance 1x weekly, #13, 3 refills   2x weekly, #26, 3 refills   3x weekly, #39, 3 refills    Code for past statin intolerance  (required annually)  Provider Requirements: Must asociate code during an office visit or telehealth encounter   Drug Induced Myopathy G72.0   Myalgia M79.1   Myositis, unspecified M60.9   Myopathy, unspecified G72.9   Rhabdomyolysis M62.82     Loretha Brasil, PharmD [place] Pharmacist Direct Dial: [phone]

## 2022-03-22 ENCOUNTER — Encounter: Payer: Self-pay | Admitting: Internal Medicine

## 2022-03-22 ENCOUNTER — Ambulatory Visit: Payer: PPO | Attending: Cardiology

## 2022-03-22 ENCOUNTER — Ambulatory Visit (INDEPENDENT_AMBULATORY_CARE_PROVIDER_SITE_OTHER): Payer: PPO | Admitting: Internal Medicine

## 2022-03-22 VITALS — BP 124/78 | HR 60 | Ht 67.0 in | Wt 179.0 lb

## 2022-03-22 DIAGNOSIS — Z23 Encounter for immunization: Secondary | ICD-10-CM | POA: Diagnosis not present

## 2022-03-22 DIAGNOSIS — I10 Essential (primary) hypertension: Secondary | ICD-10-CM | POA: Diagnosis not present

## 2022-03-22 DIAGNOSIS — E782 Mixed hyperlipidemia: Secondary | ICD-10-CM

## 2022-03-22 DIAGNOSIS — I5022 Chronic systolic (congestive) heart failure: Secondary | ICD-10-CM

## 2022-03-22 DIAGNOSIS — I25118 Atherosclerotic heart disease of native coronary artery with other forms of angina pectoris: Secondary | ICD-10-CM

## 2022-03-22 DIAGNOSIS — Z0000 Encounter for general adult medical examination without abnormal findings: Secondary | ICD-10-CM

## 2022-03-22 DIAGNOSIS — I739 Peripheral vascular disease, unspecified: Secondary | ICD-10-CM

## 2022-03-22 DIAGNOSIS — D6869 Other thrombophilia: Secondary | ICD-10-CM

## 2022-03-22 DIAGNOSIS — I428 Other cardiomyopathies: Secondary | ICD-10-CM

## 2022-03-22 DIAGNOSIS — Z125 Encounter for screening for malignant neoplasm of prostate: Secondary | ICD-10-CM | POA: Diagnosis not present

## 2022-03-22 NOTE — Assessment & Plan Note (Signed)
s/p left popliteal and TPT endarterectomy with removal of Viabahn stent, and left SFA to TPT bypass with ipsilateral, nonreversed translocated GSV on 01/25/2022 by Dr. Donzetta Matters.  This was done for left lower extremity chronic limb threatening ischemia with rest pain.

## 2022-03-22 NOTE — Progress Notes (Signed)
Date:  03/22/2022   Name:  [name]   DOB:  20-Oct-1950   MRN:  [id number]   Chief Complaint: No chief complaint on file. [name] is a 72 y.o. male who presents today for his Complete Annual Exam. He feels well. He reports exercising play golf and ride bike. He reports he is sleeping well. He is doing well after fem-pop bypass and endarterectomy.  Colonoscopy: 08/2018 repeat 5 yrs  Immunization History  Administered Date(s) Administered   Fluad Quad(high Dose 65+) 11/23/2018, 11/18/2021   Influenza, High Dose Seasonal PF 12/23/2016, 12/12/2017, 12/21/2020   Influenza-Unspecified 12/15/2015, 12/23/2016   PFIZER Comirnaty(Gray Top)Covid-19 Tri-Sucrose Vaccine 04/09/2019, 04/30/2019, 12/02/2019   PFIZER(Purple Top)SARS-COV-2 Vaccination 04/09/2019, 04/30/2019   Pneumococcal Conjugate-13 06/15/2015   Pneumococcal Polysaccharide-23 01/06/2017   Tdap 04/18/2012   Zoster Recombinat (Shingrix) 01/17/2020, 04/22/2020   Health Maintenance Due  Topic Date Due   COVID-19 Vaccine (6 - 2023-24 season) 10/29/2021    Lab Results  Component Value Date   PSA1 0.5 03/18/2021   PSA1 0.9 05/21/2019   PSA1 0.6 05/17/2018   PSA 0.83 06/15/2015    Hypertension This is a chronic problem. The problem is controlled. Pertinent negatives include no chest pain, headaches, palpitations or shortness of breath. Hypertensive end-organ damage includes CAD/MI, heart failure and PVD. There is no history of kidney disease or CVA.  Hyperlipidemia This is a chronic problem. The problem is uncontrolled. Associated symptoms include myalgias (mild leg pain improving). Pertinent negatives include no chest pain or shortness of breath.    Lab Results  Component Value Date   NA 137 01/26/2022   K 4.0 01/26/2022   CO2 25 01/26/2022   GLUCOSE 101 (H) 01/26/2022   BUN 15 01/26/2022   CREATININE 0.80 01/26/2022   CALCIUM 7.7 (L) 01/26/2022   EGFR 93 11/18/2020   GFRNONAA >60 01/26/2022   Lab Results   Component Value Date   CHOL 127 03/18/2021   HDL 63 03/18/2021   LDLCALC 44 03/18/2021   TRIG 111 03/18/2021   CHOLHDL 2.0 03/18/2021   Lab Results  Component Value Date   TSH 3.217 12/06/2021   Lab Results  Component Value Date   HGBA1C 5.6 11/07/2019   Lab Results  Component Value Date   WBC 4.2 01/26/2022   HGB 10.5 (L) 01/26/2022   HCT 31.9 (L) 01/26/2022   MCV 104.2 (H) 01/26/2022   PLT 110 (L) 01/26/2022   Lab Results  Component Value Date   ALT 50 (H) 12/06/2021   AST 43 (H) 12/06/2021   ALKPHOS 96 12/06/2021   BILITOT 0.8 12/06/2021   Lab Results  Component Value Date   VD25OH 42.7 09/06/2017     Review of Systems  Constitutional:  Negative for appetite change, chills, diaphoresis, fatigue and unexpected weight change.  HENT:  Negative for hearing loss, tinnitus, trouble swallowing and voice change.   Eyes:  Negative for visual disturbance.  Respiratory:  Negative for choking, shortness of breath and wheezing.   Cardiovascular:  Negative for chest pain, palpitations and leg swelling.  Gastrointestinal:  Negative for abdominal pain, blood in stool, constipation and diarrhea.  Genitourinary:  Negative for difficulty urinating, dysuria and frequency.  Musculoskeletal:  Positive for myalgias (mild leg pain improving). Negative for arthralgias and back pain.  Skin:  Negative for color change and rash.  Neurological:  Positive for numbness (distal left foot). Negative for dizziness, syncope and headaches.  Hematological:  Negative for adenopathy.  Psychiatric/Behavioral:  Negative  for dysphoric mood and sleep disturbance. The patient is not nervous/anxious.     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   Accelerating angina ([place]) 11/05/2019   Left bundle branch block 11/05/2019   Stricture and stenosis of esophagus    Polyp of colon    Rectal polyp    CAD, multiple vessel 08/13/2018   Carotid stenosis 07/17/2018   Atherosclerosis of artery of extremity with rest pain ([place]) 06/04/2018   Chronic HFrEF (heart failure with reduced ejection fraction) ([place]) 03/12/2018   Nonischemic cardiomyopathy ([place]) 12/08/2017   Myalgia due to statin 12/08/2017   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 ([place]) 05/01/2017   Foot pain, bilateral 05/01/2017   Degenerative disc disease, 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   History of tobacco use 07/13/2016   Overweight (BMI 25.0-29.9) 07/13/2016   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/13/2016   Dysphagia 11/26/2015   Chronic left hip pain 09/15/2015   Essential hypertension 08/08/2015   Coronary artery disease of native artery of native heart with stable angina pectoris ([place])    Mixed hyperlipidemia 01/29/2013   History of CEA (carotid endarterectomy) 11/08/2011    Allergies  Allergen Reactions   Brilinta [Ticagrelor] Shortness Of Breath   Chlorhexidine Gluconate Other (See Comments)    Skin burning for hours afterward   Contrast Media [Iodinated Contrast Media] Itching    Face and head flushing, nose itching after contrast administation for angiogram   Statins Other (See Comments)    Failed Crestor 5 mg twice weekly, Crestor 20 mg daily, Pravastatin 40 mg qd, Lipitor, Zocor - muscle aches   Zetia [Ezetimibe] Other (See Comments)    Muscle aches    Past Surgical History:  Procedure Laterality Date   ABDOMINAL AORTOGRAM W/LOWER EXTREMITY Left 01/24/2022   Procedure: ABDOMINAL AORTOGRAM W/LOWER EXTREMITY;  Surgeon: Waynetta Sandy, MD;  Location: [facility];  Service: Cardiovascular;  Laterality: Left;   APPENDECTOMY     BACK SURGERY     BIV ICD  INSERTION CRT-D N/A 03/23/2020   Procedure: BIV ICD INSERTION CRT-D;  Surgeon: Vickie Epley, MD;  Location: [facility];  Service: Cardiovascular;  Laterality: N/A;   CARDIAC CATHETERIZATION N/A 08/07/2015   Procedure: Left Heart Cath and Coronary Angiography;  Surgeon: [name], MD;  Location: [facility];  Service: Cardiovascular;  Laterality: N/A;   CARDIAC CATHETERIZATION N/A 08/07/2015   Procedure: Coronary Stent Intervention;  Surgeon: [name], MD;  Location: [facility];  Service: Cardiovascular;  Laterality: N/A;   CARDIAC CATHETERIZATION N/A 08/07/2015   Procedure: Coronary Stent Intervention;  Surgeon: [name], MD;  Location: [facility];  Service: Cardiovascular;  Laterality: N/A;   CAROTID ENDARTERECTOMY  01/05/2006   Right  CEA with DPA   CATARACT EXTRACTION W/ INTRAOCULAR LENS IMPLANT Left 12/04/2017   CATARACT EXTRACTION W/PHACO Left 12/04/2017   Procedure: CATARACT EXTRACTION PHACO AND INTRAOCULAR LENS PLACEMENT ([place]) LEFT;  Surgeon: Eulogio Bear, MD;  Location: [facility];  Service: Ophthalmology;  Laterality: Left;   CATARACT EXTRACTION W/PHACO Right 02/06/2018   Procedure: CATARACT EXTRACTION PHACO AND INTRAOCULAR  LENS PLACEMENT (IOC)RIGHT;  Surgeon: Eulogio Bear, MD;  Location: [facility];  Service: Ophthalmology;  Laterality: Right;   COLONOSCOPY  05/20/2008   COLONOSCOPY WITH PROPOFOL N/A 09/13/2018   Procedure: COLONOSCOPY WITH BIOPSY;  Surgeon: [name], MD;  Location: [facility];  Service: Endoscopy;  Laterality: N/A;   CORONARY ARTERY BYPASS GRAFT N/A 11/11/2019   Procedure: CORONARY ARTERY BYPASS GRAFTING (CABG) USING LIMA to Diag1; ENDOSCOPICALLY HARVESTED RIGHT GREATER SAPHENOUS VEIN: SVG to OM1; SVG to OM2; SVG to PDA.;  Surgeon: Ivin Poot, MD;  Location: [facility];  Service: Open Heart Surgery;  Laterality: N/A;   CORONARY STENT PLACEMENT  08/07/2015   MID CIRCUMFLEX    ENDARTERECTOMY FEMORAL Bilateral 09/30/2020   Procedure: ENDARTERECTOMY FEMORAL;  Surgeon: Algernon Huxley, MD;  Location: [facility];  Service: Vascular;  Laterality: Bilateral;   ENDARTERECTOMY POPLITEAL Left 01/25/2022   Procedure: ENDARTERECTOMY POPLITEAL;  Surgeon: Waynetta Sandy, MD;  Location: [facility];  Service: Vascular;  Laterality: Left;   ENDOVEIN HARVEST OF GREATER SAPHENOUS VEIN Right 11/11/2019   Procedure: ENDOVEIN HARVEST OF GREATER SAPHENOUS VEIN;  Surgeon: Ivin Poot, MD;  Location: [facility];  Service: Open Heart Surgery;  Laterality: Right;   ESOPHAGOGASTRODUODENOSCOPY (EGD) WITH PROPOFOL N/A 02/11/2019   Procedure: ESOPHAGOGASTRODUODENOSCOPY (EGD) WITH BIOPSY and  Dilation;  Surgeon: [name], MD;  Location: [facility];  Service: Endoscopy;  Laterality: N/A;   ESOPHAGOGASTRODUODENOSCOPY (EGD) WITH PROPOFOL N/A 12/23/2021   Procedure: ESOPHAGOGASTRODUODENOSCOPY (EGD) WITH PROPOFOL;  Surgeon: [name], MD;  Location: [facility];  Service: Endoscopy;  Laterality: N/A;   FEMORAL-FEMORAL BYPASS GRAFT Left 01/25/2022   Procedure: left Femoral to below the knee Popliteal bypass.;  Surgeon: Waynetta Sandy, MD;  Location: [facility];  Service: Vascular;  Laterality: Left;   HIP SURGERY Left 10/2016   left hip tendon repair   LEFT HEART CATH AND CORONARY ANGIOGRAPHY N/A 11/27/2017   Procedure: LEFT HEART CATH AND CORONARY ANGIOGRAPHY;  Surgeon: Wellington Hampshire, MD;  Location: [facility];  Service: Cardiovascular;  Laterality: N/A;   LOWER EXTREMITY ANGIOGRAPHY Left 02/12/2018   Procedure: LOWER EXTREMITY ANGIOGRAPHY;  Surgeon: Algernon Huxley, MD;  Location: [facility];  Service: Cardiovascular;  Laterality: Left;   LOWER EXTREMITY ANGIOGRAPHY Left 03/07/2018   Procedure: LOWER EXTREMITY ANGIOGRAPHY;  Surgeon: Algernon Huxley, MD;  Location: [facility];  Service: Cardiovascular;  Laterality: Left;   LOWER EXTREMITY ANGIOGRAPHY Left  06/04/2018   Procedure: LOWER EXTREMITY ANGIOGRAPHY;  Surgeon: Algernon Huxley, MD;  Location: [facility];  Service: Cardiovascular;  Laterality: Left;   LOWER EXTREMITY ANGIOGRAPHY Left 09/17/2020   Procedure: LOWER EXTREMITY ANGIOGRAPHY;  Surgeon: Algernon Huxley, MD;  Location: [facility];  Service: Cardiovascular;  Laterality: Left;   LOWER EXTREMITY ANGIOGRAPHY Left 09/28/2020   Procedure: LOWER EXTREMITY ANGIOGRAPHY;  Surgeon: Algernon Huxley, MD;  Location: [facility];  Service: Cardiovascular;  Laterality: Left;   LOWER EXTREMITY ANGIOGRAPHY N/A 02/15/2021   Procedure: LOWER EXTREMITY ANGIOGRAPHY;  Surgeon: Algernon Huxley, MD;  Location: [facility];  Service: Cardiovascular;  Laterality: N/A;   LOWER EXTREMITY ANGIOGRAPHY Left 12/27/2021   Procedure: Lower Extremity Angiography;  Surgeon: Algernon Huxley, MD;  Location: [facility];  Service: Cardiovascular;  Laterality: Left;   LOWER EXTREMITY ANGIOGRAPHY Left 12/28/2021   Procedure: Lower Extremity Angiography;  Surgeon: Algernon Huxley, MD;  Location: [facility];  Service: Cardiovascular;  Laterality: Left;   PLACEMENT OF IMPELLA LEFT VENTRICULAR ASSIST DEVICE N/A 11/11/2019   Procedure: PLACEMENT OF IMPELLA LEFT VENTRICULAR ASSIST DEVICE 5.5;  Surgeon: Ivin Poot, MD;  Location: [facility];  Service: Open Heart Surgery;  Laterality: N/A;  Midline Sternotomy   POLYPECTOMY N/A 09/13/2018   Procedure: POLYPECTOMY;  Surgeon: [name], MD;  Location: [facility];  Service: Endoscopy;  Laterality: N/A;   POLYPECTOMY N/A 02/11/2019   Procedure: POLYPECTOMY;  Surgeon: [name], MD;  Location: [facility];  Service: Endoscopy;  Laterality: N/A;   REMOVAL OF IMPELLA LEFT VENTRICULAR ASSIST DEVICE N/A 11/15/2019   Procedure: REMOVAL OF IMPELLA 5.5 LEFT VENTRICULAR ASSIST DEVICE;  Surgeon: Ivin Poot, MD;  Location: [facility];  Service: Open Heart Surgery;  Laterality: N/A;   RIGHT/LEFT  HEART CATH AND CORONARY ANGIOGRAPHY N/A 11/05/2019   Procedure: RIGHT/LEFT HEART CATH AND CORONARY ANGIOGRAPHY;  Surgeon: Nelva Bush, MD;  Location: [facility];  Service: Cardiovascular;  Laterality: N/A;   SPINE SURGERY     TEE WITHOUT CARDIOVERSION N/A 11/11/2019   Procedure: TRANSESOPHAGEAL ECHOCARDIOGRAM (TEE);  Surgeon: Prescott Gum, Collier Salina, MD;  Location: [facility];  Service: Open Heart Surgery;  Laterality: N/A;   TEE WITHOUT CARDIOVERSION N/A 11/15/2019   Procedure: TRANSESOPHAGEAL ECHOCARDIOGRAM (TEE);  Surgeon: Prescott Gum, Collier Salina, MD;  Location: [place] [facility];  Service: Open Heart Surgery;  Laterality: N/A;   TONSILLECTOMY      Social History   Tobacco Use   Smoking status: Former    Packs/day: 1.25    Years: 35.00    Total pack years: 43.75    Types: Cigarettes    Quit date: 02/28/2005    Years since quitting: 17.0    Passive exposure: Never   Smokeless tobacco: Current    Types: Snuff   Tobacco comments:    occaisionally  Vaping Use   Vaping Use: Never used  Substance Use Topics   Alcohol use: Yes    Alcohol/week: 10.0 standard drinks of alcohol    Types: 10 Standard drinks or equivalent per week    Comment: wine/liquor weekly   Drug use: No     Medication list has been reviewed and updated.  Current Meds  Medication Sig   aspirin EC 81 MG tablet Take 1 tablet (81 mg total) by mouth daily.   carvedilol (COREG) 12.5 MG tablet TAKE (1) TABLET BY MOUTH TWICE DAILY   colchicine 0.6 MG tablet TAKE 1 TABLET BY MOUTH 2 TIMES DAILY. (Patient taking differently: Take 0.6 mg by mouth 2 (two) times daily as needed (gout attacks).)   mexiletine (MEXITIL) 150 MG capsule Take 1 capsule (150 mg total) by mouth 2 (two) times daily.   nitroGLYCERIN (NITROSTAT) 0.4 MG SL tablet Place 1 tablet (0.4 mg total) under the tongue every 5 (five) minutes as needed for chest pain.   oxyCODONE-acetaminophen (PERCOCET) 5-325 MG tablet Take 1 tablet by mouth every 8 (eight) hours as needed.    PRALUENT 150 MG/ML SOAJ Inject 150 mg into the skin every 14 (fourteen) days.   sacubitril-valsartan (ENTRESTO) 49-51 MG Take 1 tablet by mouth 2 (two) times daily.   traZODone (DESYREL) 50 MG tablet Take 1 tablet (50 mg total) by mouth at bedtime as needed. for sleep       03/22/2022    7:57 AM 03/18/2021   10:20 AM 02/24/2021   11:29 AM 10/14/2020   11:09 AM  GAD 7 : Generalized Anxiety Score  Nervous, Anxious,  on Edge 0 0 0 0  Control/stop worrying 0 0 0 0  Worry too much - different things 0 0 0 0  Trouble relaxing 0 0 0 0  Restless 0 0 0 0  Easily annoyed or irritable 0 0 0 0  Afraid - awful might happen 0 0 0 0  Total GAD 7 Score 0 0 0 0  Anxiety Difficulty Not difficult at all  Not difficult at all        03/22/2022    7:57 AM 03/18/2021   10:20 AM 02/24/2021   11:29 AM  Depression screen PHQ 2/9  Decreased Interest 0 0 1  Down, Depressed, Hopeless 0 0 1  PHQ - 2 Score 0 0 2  Altered sleeping 0 0 0  Tired, decreased energy 0 0   Change in appetite 0 0 0  Feeling bad or failure about yourself  0 0 0  Trouble concentrating 0 0 0  Moving slowly or fidgety/restless 0 0 0  Suicidal thoughts 0 0 0  PHQ-9 Score 0 0 2  Difficult doing work/chores Not difficult at all Not difficult at all Not difficult at all    BP Readings from Last 3 Encounters:  03/22/22 124/78  03/02/22 98/67  02/16/22 112/78    Physical Exam Vitals and nursing note reviewed.  Constitutional:      Appearance: Normal appearance. He is well-developed.  HENT:     Head: Normocephalic.     Right Ear: Tympanic membrane, ear canal and external ear normal.     Left Ear: Tympanic membrane, ear canal and external ear normal.     Nose: Nose normal.     Mouth/Throat:     Mouth: Mucous membranes are moist.  Eyes:     Conjunctiva/sclera: Conjunctivae normal.     Pupils: Pupils are equal, round, and reactive to light.  Neck:     Thyroid: No thyromegaly.     Vascular: No carotid bruit.  Cardiovascular:      Rate and Rhythm: Normal rate and regular rhythm.     Heart sounds: Normal heart sounds.  Pulmonary:     Effort: Pulmonary effort is normal.     Breath sounds: Normal breath sounds. No wheezing.  Chest:  Breasts:    Right: No mass.     Left: No mass.  Abdominal:     General: Bowel sounds are normal.     Palpations: Abdomen is soft.     Tenderness: There is no abdominal tenderness.  Musculoskeletal:        General: No tenderness. Normal range of motion.     Cervical back: Normal range of motion and neck supple.     Right lower leg: No edema.     Left lower leg: No edema.  Lymphadenopathy:     Cervical: No cervical adenopathy.  Skin:    General: Skin is warm and dry.     Capillary Refill: Capillary refill takes less than 2 seconds.  Neurological:     General: No focal deficit present.     Mental Status: He is alert and oriented to person, place, and time.     Deep Tendon Reflexes: Reflexes are normal and symmetric.  Psychiatric:        Attention and Perception: Attention normal.        Mood and Affect: Mood normal.        Thought Content: Thought content normal.     Wt Readings from Last 3 Encounters:  03/22/22 179 lb (  81.2 kg)  03/02/22 179 lb (81.2 kg)  02/16/22 178 lb 3.2 oz (80.8 kg)    BP 124/78   Pulse 60   Ht '5\' 7"'$  (1.702 m)   Wt 179 lb (81.2 kg)   SpO2 98%   BMI 28.04 kg/m   Assessment and Plan: Problem List Items Addressed This Visit       Cardiovascular and Mediastinum   Chronic HFrEF (heart failure with reduced ejection fraction) ([place])    Followed closely by Cardiology On Enstresto and Coreg Intolerant of statins and Zetia      Relevant Orders   TSH + free T4   Coronary artery disease of native artery of native heart with stable angina pectoris ([place])    Followed by Cardiology On Praluent previously but now very costly He will discuss options with Dr. Saunders Revel at next visit Lipid panel today      Essential hypertension (Chronic)     Clinically stable exam with well controlled BP on coreg. Tolerating medications without side effects. Pt to continue current regimen and low sodium diet.       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 Donzetta Matters.  This was done for left lower extremity chronic limb threatening ischemia with rest pain.       Relevant Orders   Lipid panel     Hematopoietic and Hemostatic   Acquired thrombophilia ([place])    No bleeding issues noted      Relevant Orders   CBC with Differential/Platelet     Other   Mixed hyperlipidemia    Intolerant of statins Was on Praluent trial/pt assistance but that has run out      Other Visit Diagnoses     Annual physical exam    -  Primary   Stable exam up to date on immunizations and screenings continue exercise, healthy diet   Prostate cancer screening       DRE deferred   Relevant Orders   PSA   Need for vaccination for pneumococcus       he will consider this in the near future Prevnar 20        Partially dictated using Dragon software. Any errors are unintentional.  Halina Maidens, MD [facility]  03/22/2022

## 2022-03-22 NOTE — Assessment & Plan Note (Signed)
Followed closely by Cardiology On Enstresto and Coreg Intolerant of statins and Zetia

## 2022-03-22 NOTE — Assessment & Plan Note (Addendum)
No bleeding issues noted

## 2022-03-22 NOTE — Patient Instructions (Signed)
Prevnar-20 pneumonia vaccine is needed

## 2022-03-22 NOTE — Assessment & Plan Note (Signed)
Intolerant of statins Was on Praluent trial/pt assistance but that has run out

## 2022-03-22 NOTE — Assessment & Plan Note (Signed)
Followed by Cardiology On Praluent previously but now very costly He will discuss options with Dr. Saunders Revel at next visit Lipid panel today

## 2022-03-22 NOTE — Assessment & Plan Note (Signed)
Clinically stable exam with well controlled BP on coreg. Tolerating medications without side effects. Pt to continue current regimen and low sodium diet.

## 2022-03-23 LAB — CBC WITH DIFFERENTIAL/PLATELET
Basophils Absolute: 0 10*3/uL (ref 0.0–0.2)
Basos: 1 %
EOS (ABSOLUTE): 0.2 10*3/uL (ref 0.0–0.4)
Eos: 7 %
Hematocrit: 40.7 % (ref 37.5–51.0)
Hemoglobin: 14.1 g/dL (ref 13.0–17.7)
Immature Grans (Abs): 0 10*3/uL (ref 0.0–0.1)
Immature Granulocytes: 1 %
Lymphocytes Absolute: 1.5 10*3/uL (ref 0.7–3.1)
Lymphs: 41 %
MCH: 32.6 pg (ref 26.6–33.0)
MCHC: 34.6 g/dL (ref 31.5–35.7)
MCV: 94 fL (ref 79–97)
Monocytes Absolute: 0.6 10*3/uL (ref 0.1–0.9)
Monocytes: 16 %
Neutrophils Absolute: 1.2 10*3/uL — ABNORMAL LOW (ref 1.4–7.0)
Neutrophils: 34 %
Platelets: 200 10*3/uL (ref 150–450)
RBC: 4.33 x10E6/uL (ref 4.14–5.80)
RDW: 11.4 % — ABNORMAL LOW (ref 11.6–15.4)
WBC: 3.5 10*3/uL (ref 3.4–10.8)

## 2022-03-23 LAB — COMPREHENSIVE METABOLIC PANEL
ALT: 21 IU/L (ref 0–44)
AST: 19 IU/L (ref 0–40)
Albumin/Globulin Ratio: 1.6 (ref 1.2–2.2)
Albumin: 4.4 g/dL (ref 3.8–4.8)
Alkaline Phosphatase: 122 IU/L — ABNORMAL HIGH (ref 44–121)
BUN/Creatinine Ratio: 20 (ref 10–24)
BUN: 20 mg/dL (ref 8–27)
Bilirubin Total: 0.3 mg/dL (ref 0.0–1.2)
CO2: 24 mmol/L (ref 20–29)
Calcium: 9.6 mg/dL (ref 8.6–10.2)
Chloride: 100 mmol/L (ref 96–106)
Creatinine, Ser: 1 mg/dL (ref 0.76–1.27)
Globulin, Total: 2.7 g/dL (ref 1.5–4.5)
Glucose: 108 mg/dL — ABNORMAL HIGH (ref 70–99)
Potassium: 5 mmol/L (ref 3.5–5.2)
Sodium: 140 mmol/L (ref 134–144)
Total Protein: 7.1 g/dL (ref 6.0–8.5)
eGFR: 80 mL/min/{1.73_m2} (ref >59)

## 2022-03-23 LAB — TSH+FREE T4
Free T4: 1.19 ng/dL (ref 0.82–1.77)
TSH: 2.51 u[IU]/mL (ref 0.450–4.500)

## 2022-03-23 LAB — LIPID PANEL
Chol/HDL Ratio: 2.5 ratio (ref 0.0–5.0)
Cholesterol, Total: 135 mg/dL (ref 100–199)
HDL: 55 mg/dL (ref >39)
LDL Chol Calc (NIH): 59 mg/dL (ref 0–99)
Triglycerides: 120 mg/dL (ref 0–149)
VLDL Cholesterol Cal: 21 mg/dL (ref 5–40)

## 2022-03-23 LAB — PSA: Prostate Specific Ag, Serum: 0.5 ng/mL (ref 0.0–4.0)

## 2022-03-24 LAB — CUP PACEART REMOTE DEVICE CHECK
Battery Remaining Longevity: 43 mo
Battery Remaining Percentage: 67 %
Battery Voltage: 2.96 V
Brady Statistic AP VP Percent: 86 %
Brady Statistic AP VS Percent: 5.9 %
Brady Statistic AS VP Percent: 3.7 %
Brady Statistic AS VS Percent: 1 %
Brady Statistic RA Percent Paced: 90 %
Date Time Interrogation Session: 20240125081959
HighPow Impedance: 63 Ohm
Implantable Lead Connection Status: 753985
Implantable Lead Connection Status: 753985
Implantable Lead Connection Status: 753985
Implantable Lead Implant Date: 20220124
Implantable Lead Implant Date: 20220124
Implantable Lead Implant Date: 20220124
Implantable Lead Location: 753858
Implantable Lead Location: 753859
Implantable Lead Location: 753860
Implantable Pulse Generator Implant Date: 20220124
Lead Channel Impedance Value: 460 Ohm
Lead Channel Impedance Value: 490 Ohm
Lead Channel Impedance Value: 500 Ohm
Lead Channel Pacing Threshold Amplitude: 0.5 V
Lead Channel Pacing Threshold Amplitude: 0.75 V
Lead Channel Pacing Threshold Amplitude: 2 V
Lead Channel Pacing Threshold Pulse Width: 0.5 ms
Lead Channel Pacing Threshold Pulse Width: 0.5 ms
Lead Channel Pacing Threshold Pulse Width: 0.5 ms
Lead Channel Sensing Intrinsic Amplitude: 12 mV
Lead Channel Sensing Intrinsic Amplitude: 2.1 mV
Lead Channel Setting Pacing Amplitude: 1.75 V
Lead Channel Setting Pacing Amplitude: 2 V
Lead Channel Setting Pacing Amplitude: 2.5 V
Lead Channel Setting Pacing Pulse Width: 0.5 ms
Lead Channel Setting Pacing Pulse Width: 0.5 ms
Lead Channel Setting Sensing Sensitivity: 0.5 mV
Pulse Gen Serial Number: 810017138
Zone Setting Status: 755011

## 2022-04-05 ENCOUNTER — Encounter (INDEPENDENT_AMBULATORY_CARE_PROVIDER_SITE_OTHER): Payer: PPO

## 2022-04-05 ENCOUNTER — Ambulatory Visit (INDEPENDENT_AMBULATORY_CARE_PROVIDER_SITE_OTHER): Payer: PPO | Admitting: Nurse Practitioner

## 2022-04-05 ENCOUNTER — Ambulatory Visit (INDEPENDENT_AMBULATORY_CARE_PROVIDER_SITE_OTHER): Payer: PPO | Admitting: Vascular Surgery

## 2022-04-07 ENCOUNTER — Ambulatory Visit (INDEPENDENT_AMBULATORY_CARE_PROVIDER_SITE_OTHER): Payer: PPO | Admitting: Nurse Practitioner

## 2022-04-07 ENCOUNTER — Encounter (INDEPENDENT_AMBULATORY_CARE_PROVIDER_SITE_OTHER): Payer: PPO

## 2022-04-08 ENCOUNTER — Encounter (INDEPENDENT_AMBULATORY_CARE_PROVIDER_SITE_OTHER): Payer: PPO

## 2022-04-08 ENCOUNTER — Ambulatory Visit (INDEPENDENT_AMBULATORY_CARE_PROVIDER_SITE_OTHER): Payer: PPO | Admitting: Vascular Surgery

## 2022-04-18 NOTE — Progress Notes (Signed)
Remote ICD transmission.   

## 2022-04-29 ENCOUNTER — Encounter: Payer: Self-pay | Admitting: Internal Medicine

## 2022-05-04 ENCOUNTER — Ambulatory Visit (INDEPENDENT_AMBULATORY_CARE_PROVIDER_SITE_OTHER)
Admission: RE | Admit: 2022-05-04 | Discharge: 2022-05-04 | Disposition: A | Discharge status: Home / Self Care | Payer: PPO | Source: Ambulatory Visit | Attending: Vascular Surgery | Admitting: Vascular Surgery

## 2022-05-04 ENCOUNTER — Ambulatory Visit (HOSPITAL_COMMUNITY)
Admission: RE | Admit: 2022-05-04 | Discharge: 2022-05-04 | Disposition: A | Discharge status: Home / Self Care | Payer: PPO | Source: Ambulatory Visit | Attending: Vascular Surgery | Admitting: Vascular Surgery

## 2022-05-04 ENCOUNTER — Ambulatory Visit (INDEPENDENT_AMBULATORY_CARE_PROVIDER_SITE_OTHER): Payer: PPO | Admitting: Physician Assistant

## 2022-05-04 VITALS — BP 141/90 | HR 63 | Temp 98.0°F | Wt 184.0 lb

## 2022-05-04 DIAGNOSIS — I6523 Occlusion and stenosis of bilateral carotid arteries: Secondary | ICD-10-CM | POA: Diagnosis not present

## 2022-05-04 DIAGNOSIS — I70222 Atherosclerosis of native arteries of extremities with rest pain, left leg: Secondary | ICD-10-CM

## 2022-05-04 DIAGNOSIS — I739 Peripheral vascular disease, unspecified: Secondary | ICD-10-CM | POA: Diagnosis not present

## 2022-05-04 LAB — VAS US ABI WITH/WO TBI
Left ABI: 1.12
Right ABI: 1.1

## 2022-05-04 NOTE — Progress Notes (Signed)
[facility] [place] HISTORY AND PHYSICAL   History of Present Illness:  Patient is a 72 y.o. year old male who presents for evaluation of PAD.  He is s/p  left popliteal and TPT endarterectomy with removal of Viabahn stent, and left SFA to TPT bypass with ipsilateral, nonreversed translocated GSV on 01/25/2022 by Dr. Donzetta Matters.  This was done for left lower extremity chronic limb threatening ischemia with rest pain.   He denise rest pain, claudication and non healing wounds.  All of his wounds are fully healed at this point and he is walking for exercise while playing golf.   He is also followed for carotid stenosis s/p right CEA 2007.  He denise stroke/TIA symptoms.  No amaurosis, aphasia or weakness.   He is medically managed on ASA daily. He has an allergy to Statins.   Past Medical History:  Diagnosis Date   Abnormal nuclear cardiac imaging test 08/08/2015   AICD (automatic cardioverter/defibrillator) present    pacemaker/defib [employer]   Arthritis    fingers   Carotid artery occlusion    CHF (congestive heart failure) (HCC)    Coronary atherosclerosis of native coronary artery 01/29/2013   11/05/19 R/LHC 80% dLMCA stenosis small diffusely dz dLAD, chronically occluded OM1 50%mRCA lesion, widely patent mLCx strent, moderately elevated L heart filling pressures, mild to moderate RH filling pressures, normal to moderately reduced CO   Duodenal erosion    Encounter for screening for lung cancer 07/13/2016   Esophageal stenosis    esophageal dilation   GERD (gastroesophageal reflux disease)    H. pylori infection    Heart attack ([place]) 11/2007   Mild   Hiatal hernia    Hyperlipidemia    Hypertension    Ischemic leg 12/27/2021   Old myocardial infarction 11/29/2007   Mildly elevated troponin, isolated value in October 2009. Cardiac catheterization-nonobstructive 60% RCA disease-subsequent nuclear stress test-9 minutes, low risk, mild inferior wall hypokinesis     Pain in limb 12/19/2017   Peripheral vascular disease ([place])    Unstable angina ([place]) 11/25/2017    Past Surgical History:  Procedure Laterality Date   ABDOMINAL AORTOGRAM W/LOWER EXTREMITY Left 01/24/2022   Procedure: ABDOMINAL AORTOGRAM W/LOWER EXTREMITY;  Surgeon: Waynetta Sandy, MD;  Location: [facility];  Service: Cardiovascular;  Laterality: Left;   APPENDECTOMY     BACK SURGERY     BIV ICD INSERTION CRT-D N/A 03/23/2020   Procedure: BIV ICD INSERTION CRT-D;  Surgeon: Vickie Epley, MD;  Location: [facility];  Service: Cardiovascular;  Laterality: N/A;   CARDIAC CATHETERIZATION N/A 08/07/2015   Procedure: Left Heart Cath and Coronary Angiography;  Surgeon: [name], MD;  Location: [facility];  Service: Cardiovascular;  Laterality: N/A;   CARDIAC CATHETERIZATION N/A 08/07/2015   Procedure: Coronary Stent Intervention;  Surgeon: [name], MD;  Location: [facility];  Service: Cardiovascular;  Laterality: N/A;   CARDIAC CATHETERIZATION N/A 08/07/2015   Procedure: Coronary Stent Intervention;  Surgeon: [name], MD;  Location: [facility];  Service: Cardiovascular;  Laterality: N/A;   CAROTID ENDARTERECTOMY  01/05/2006   Right  CEA with DPA   CATARACT EXTRACTION W/ INTRAOCULAR LENS IMPLANT Left 12/04/2017   CATARACT EXTRACTION W/PHACO Left 12/04/2017   Procedure: CATARACT EXTRACTION PHACO AND INTRAOCULAR LENS PLACEMENT ([place]) LEFT;  Surgeon: Eulogio Bear, MD;  Location: [facility];  Service: Ophthalmology;  Laterality: Left;   CATARACT EXTRACTION W/PHACO  Right 02/06/2018   Procedure: CATARACT EXTRACTION PHACO AND INTRAOCULAR LENS PLACEMENT (IOC)RIGHT;  Surgeon: Eulogio Bear, MD;  Location: [facility];  Service: Ophthalmology;  Laterality: Right;   COLONOSCOPY  05/20/2008   COLONOSCOPY WITH PROPOFOL N/A 09/13/2018   Procedure: COLONOSCOPY WITH BIOPSY;  Surgeon: [name], MD;  Location: [facility];  Service: Endoscopy;  Laterality: N/A;   CORONARY ARTERY BYPASS GRAFT N/A 11/11/2019   Procedure: CORONARY ARTERY BYPASS GRAFTING (CABG) USING LIMA to Diag1; ENDOSCOPICALLY HARVESTED RIGHT GREATER SAPHENOUS VEIN: SVG to OM1; SVG to OM2; SVG to PDA.;  Surgeon: Ivin Poot, MD;  Location: [facility];  Service: Open Heart Surgery;  Laterality: N/A;   CORONARY STENT PLACEMENT  08/07/2015   MID CIRCUMFLEX   ENDARTERECTOMY FEMORAL Bilateral 09/30/2020   Procedure: ENDARTERECTOMY FEMORAL;  Surgeon: Algernon Huxley, MD;  Location: [facility];  Service: Vascular;  Laterality: Bilateral;   ENDARTERECTOMY POPLITEAL Left 01/25/2022   Procedure: ENDARTERECTOMY POPLITEAL;  Surgeon: Waynetta Sandy, MD;  Location: [facility];  Service: Vascular;  Laterality: Left;   ENDOVEIN HARVEST OF GREATER SAPHENOUS VEIN Right 11/11/2019   Procedure: ENDOVEIN HARVEST OF GREATER SAPHENOUS VEIN;  Surgeon: Ivin Poot, MD;  Location: [facility];  Service: Open Heart Surgery;  Laterality: Right;   ESOPHAGOGASTRODUODENOSCOPY (EGD) WITH PROPOFOL N/A 02/11/2019   Procedure: ESOPHAGOGASTRODUODENOSCOPY (EGD) WITH BIOPSY and  Dilation;  Surgeon: [name], MD;  Location: [facility];  Service: Endoscopy;  Laterality: N/A;   ESOPHAGOGASTRODUODENOSCOPY (EGD) WITH PROPOFOL N/A 12/23/2021   Procedure: ESOPHAGOGASTRODUODENOSCOPY (EGD) WITH PROPOFOL;  Surgeon: [name], MD;  Location: [facility];  Service: Endoscopy;  Laterality: N/A;   FEMORAL-FEMORAL BYPASS GRAFT Left 01/25/2022   Procedure: left Femoral to below the knee Popliteal bypass.;  Surgeon: Waynetta Sandy, MD;  Location: [facility];  Service: Vascular;  Laterality: Left;   HIP SURGERY Left 10/2016   left hip tendon repair   LEFT HEART CATH AND CORONARY ANGIOGRAPHY N/A 11/27/2017   Procedure: LEFT HEART CATH AND CORONARY ANGIOGRAPHY;  Surgeon: Wellington Hampshire, MD;  Location: [facility];  Service: Cardiovascular;  Laterality: N/A;    LOWER EXTREMITY ANGIOGRAPHY Left 02/12/2018   Procedure: LOWER EXTREMITY ANGIOGRAPHY;  Surgeon: Algernon Huxley, MD;  Location: [facility];  Service: Cardiovascular;  Laterality: Left;   LOWER EXTREMITY ANGIOGRAPHY Left 03/07/2018   Procedure: LOWER EXTREMITY ANGIOGRAPHY;  Surgeon: Algernon Huxley, MD;  Location: [facility];  Service: Cardiovascular;  Laterality: Left;   LOWER EXTREMITY ANGIOGRAPHY Left 06/04/2018   Procedure: LOWER EXTREMITY ANGIOGRAPHY;  Surgeon: Algernon Huxley, MD;  Location: [facility];  Service: Cardiovascular;  Laterality: Left;   LOWER EXTREMITY ANGIOGRAPHY Left 09/17/2020   Procedure: LOWER EXTREMITY ANGIOGRAPHY;  Surgeon: Algernon Huxley, MD;  Location: [facility];  Service: Cardiovascular;  Laterality: Left;   LOWER EXTREMITY ANGIOGRAPHY Left 09/28/2020   Procedure: LOWER EXTREMITY ANGIOGRAPHY;  Surgeon: Algernon Huxley, MD;  Location: [facility];  Service: Cardiovascular;  Laterality: Left;   LOWER EXTREMITY ANGIOGRAPHY N/A 02/15/2021   Procedure: LOWER EXTREMITY ANGIOGRAPHY;  Surgeon: Algernon Huxley, MD;  Location: [facility];  Service: Cardiovascular;  Laterality: N/A;   LOWER EXTREMITY ANGIOGRAPHY Left 12/27/2021   Procedure: Lower Extremity Angiography;  Surgeon: Algernon Huxley, MD;  Location: [facility];  Service: Cardiovascular;  Laterality: Left;   LOWER EXTREMITY ANGIOGRAPHY Left 12/28/2021   Procedure: Lower Extremity Angiography;  Surgeon:  Algernon Huxley, MD;  Location: [facility];  Service: Cardiovascular;  Laterality: Left;   PLACEMENT OF IMPELLA LEFT VENTRICULAR ASSIST DEVICE N/A 11/11/2019   Procedure: PLACEMENT OF IMPELLA LEFT VENTRICULAR ASSIST DEVICE 5.5;  Surgeon: Ivin Poot, MD;  Location: [facility];  Service: Open Heart Surgery;  Laterality: N/A;  Midline Sternotomy   POLYPECTOMY N/A 09/13/2018   Procedure: POLYPECTOMY;  Surgeon: [name], MD;  Location: [facility];  Service:  Endoscopy;  Laterality: N/A;   POLYPECTOMY N/A 02/11/2019   Procedure: POLYPECTOMY;  Surgeon: [name], MD;  Location: [facility];  Service: Endoscopy;  Laterality: N/A;   REMOVAL OF IMPELLA LEFT VENTRICULAR ASSIST DEVICE N/A 11/15/2019   Procedure: REMOVAL OF IMPELLA 5.5 LEFT VENTRICULAR ASSIST DEVICE;  Surgeon: Ivin Poot, MD;  Location: [employer];  Service: Open Heart Surgery;  Laterality: N/A;   RIGHT/LEFT HEART CATH AND CORONARY ANGIOGRAPHY N/A 11/05/2019   Procedure: RIGHT/LEFT HEART CATH AND CORONARY ANGIOGRAPHY;  Surgeon: Nelva Bush, MD;  Location: [facility];  Service: Cardiovascular;  Laterality: N/A;   SPINE SURGERY     TEE WITHOUT CARDIOVERSION N/A 11/11/2019   Procedure: TRANSESOPHAGEAL ECHOCARDIOGRAM (TEE);  Surgeon: Prescott Gum, Collier Salina, MD;  Location: [facility];  Service: Open Heart Surgery;  Laterality: N/A;   TEE WITHOUT CARDIOVERSION N/A 11/15/2019   Procedure: TRANSESOPHAGEAL ECHOCARDIOGRAM (TEE);  Surgeon: Prescott Gum, Collier Salina, MD;  Location: [facility];  Service: Open Heart Surgery;  Laterality: N/A;   TONSILLECTOMY      ROS:   General:  No weight loss, Fever, chills  HEENT: No recent headaches, no nasal bleeding, no visual changes, no sore throat  Neurologic: No dizziness, blackouts, seizures. No recent symptoms of stroke or mini- stroke. No recent episodes of slurred speech, or temporary blindness.  Cardiac: No recent episodes of chest pain/pressure, no shortness of breath at rest.  No shortness of breath with exertion.  Denies history of atrial fibrillation or irregular heartbeat  Vascular: No history of rest pain in feet.  No history of claudication.  No history of non-healing ulcer, No history of DVT   Pulmonary: No home oxygen, no productive cough, no hemoptysis,  No asthma or wheezing  Musculoskeletal:  '[ ]'$  Arthritis, '[ ]'$  Low back pain,  '[ ]'$  Joint pain  Hematologic:No history of hypercoagulable state.  No history of easy bleeding.  No history of  anemia  Gastrointestinal: No hematochezia or melena,  No gastroesophageal reflux, no trouble swallowing  Urinary: '[ ]'$  chronic Kidney disease, '[ ]'$  on HD - '[ ]'$  MWF or '[ ]'$  TTHS, '[ ]'$  Burning with urination, '[ ]'$  Frequent urination, '[ ]'$  Difficulty urinating;   Skin: No rashes  Psychological: No history of anxiety,  No history of depression  Social History Social History   Tobacco Use   Smoking status: Former    Packs/day: 1.25    Years: 35.00    Total pack years: 43.75    Types: Cigarettes    Quit date: 02/28/2005    Years since quitting: 17.1    Passive exposure: Never   Smokeless tobacco: Current    Types: Snuff   Tobacco comments:    occaisionally  Vaping Use   Vaping Use: Never used  Substance Use Topics   Alcohol use: Yes    Alcohol/week: 10.0 standard drinks of alcohol    Types: 10 Standard drinks or equivalent per week    Comment: wine/liquor weekly   Drug use: No    Family  History Family History  Problem Relation Age of Onset   Heart attack Mother    Coronary artery disease Mother    Heart disease Mother        Carotid Stenosis and BPG and Heart Disease before age 50   Diabetes Mother    Hypertension Mother    Heart attack Father    Heart disease Father        BPG and Heart Disease before age 66   Hypertension Father    Cancer Father 14       throat   Stroke Father    Colon cancer Neg Hx    Colon polyps Neg Hx    Esophageal cancer Neg Hx    Rectal cancer Neg Hx    Stomach cancer Neg Hx     Allergies  Allergies  Allergen Reactions   Brilinta [Ticagrelor] Shortness Of Breath   Chlorhexidine Gluconate Other (See Comments)    Skin burning for hours afterward   Contrast Media [Iodinated Contrast Media] Itching    Face and head flushing, nose itching after contrast administation for angiogram   Statins Other (See Comments)    Failed Crestor 5 mg twice weekly, Crestor 20 mg daily, Pravastatin 40 mg qd, Lipitor, Zocor - muscle aches   Zetia [Ezetimibe]  Other (See Comments)    Muscle aches     Current Outpatient Medications  Medication Sig Dispense Refill   aspirin EC 81 MG tablet Take 1 tablet (81 mg total) by mouth daily. 150 tablet 2   carvedilol (COREG) 12.5 MG tablet TAKE (1) TABLET BY MOUTH TWICE DAILY 180 tablet 1   colchicine 0.6 MG tablet TAKE 1 TABLET BY MOUTH 2 TIMES DAILY. (Patient taking differently: Take 0.6 mg by mouth 2 (two) times daily as needed (gout attacks).) 30 tablet 0   mexiletine (MEXITIL) 150 MG capsule Take 1 capsule (150 mg total) by mouth 2 (two) times daily. 180 capsule 3   nitroGLYCERIN (NITROSTAT) 0.4 MG SL tablet Place 1 tablet (0.4 mg total) under the tongue every 5 (five) minutes as needed for chest pain. 25 tablet 2   oxyCODONE-acetaminophen (PERCOCET) 5-325 MG tablet Take 1 tablet by mouth every 8 (eight) hours as needed. 20 tablet 0   PRALUENT 150 MG/ML SOAJ Inject 150 mg into the skin every 14 (fourteen) days. 6 mL 3   sacubitril-valsartan (ENTRESTO) 49-51 MG Take 1 tablet by mouth 2 (two) times daily. 180 tablet 3   traZODone (DESYREL) 50 MG tablet Take 1 tablet (50 mg total) by mouth at bedtime as needed. for sleep 90 tablet 1   No current facility-administered medications for this visit.    Physical Examination  Vitals:   05/04/22 1011  BP: (!) 141/90  Pulse: 63  Temp: 98 F (36.7 C)  TempSrc: Temporal  SpO2: 94%  Weight: 184 lb (83.5 kg)    Body mass index is 28.82 kg/m.  General:  Alert and oriented, no acute distress HEENT: Normal Neck: No bruit or JVD Pulmonary: Clear to auscultation bilaterally Cardiac: Regular Rate and Rhythm without murmur Abdomen: Soft, non-tender, non-distended, no mass, no scars Skin: No rash Extremity Pulses:  2+ radial, brachial, femoral, dorsalis pedis, posterior tibial pulses bilaterally Musculoskeletal: No deformity or edema  Neurologic: Upper and lower extremity motor 5/5 and symmetric  DATA:  Right Carotid Findings:   +----------+--------+--------+--------+------------------+--------+           PSV cm/sEDV cm/sStenosisPlaque DescriptionComments  +----------+--------+--------+--------+------------------+--------+  CCA Prox  112     21                                          +----------+--------+--------+--------+------------------+--------+  CCA Mid   75      20                                          +----------+--------+--------+--------+------------------+--------+  CCA Distal81      26              heterogenous                +----------+--------+--------+--------+------------------+--------+  ICA Prox  71      21      1-39%   heterogenous                +----------+--------+--------+--------+------------------+--------+  ICA Mid   67      29                                          +----------+--------+--------+--------+------------------+--------+  ICA Distal80      34                                          +----------+--------+--------+--------+------------------+--------+  ECA      105     18              heterogenous                +----------+--------+--------+--------+------------------+--------+   +----------+--------+-------+----------------+-------------------+           PSV cm/sEDV cmsDescribe        Arm Pressure (mmHG)  +----------+--------+-------+----------------+-------------------+  [id number]    3      Multiphasic, WNL                     +----------+--------+-------+----------------+-------------------+   +---------+--------+--+--------+--+---------+  VertebralPSV cm/s33EDV cm/s13Antegrade  +---------+--------+--+--------+--+---------+      Left Carotid Findings:  +----------+--------+--------+--------+-------------------------+--------+           PSV cm/sEDV cm/sStenosisPlaque Description       Comments  +----------+--------+--------+--------+-------------------------+--------+  CCA  Prox  90      28                                                 +----------+--------+--------+--------+-------------------------+--------+  CCA Mid   93      26              calcific and heterogenous          +----------+--------+--------+--------+-------------------------+--------+  CCA Distal87      29              calcific                           +----------+--------+--------+--------+-------------------------+--------+  ICA Prox  199     84      60-79%  calcific                           +----------+--------+--------+--------+-------------------------+--------+  ICA Mid   153     42                                                 +----------+--------+--------+--------+-------------------------+--------+  ICA Distal127     41                                                 +----------+--------+--------+--------+-------------------------+--------+  ECA      247     44      >50%                                       +----------+--------+--------+--------+-------------------------+--------+   +----------+--------+--------+----------------+-------------------+           PSV cm/sEDV cm/sDescribe        Arm Pressure (mmHG)  +----------+--------+--------+----------------+-------------------+  [id number]    7       Multiphasic, WNL                     +----------+--------+--------+----------------+-------------------+   +---------+--------+--+--------+--+---------+  VertebralPSV cm/s46EDV cm/s17Antegrade  +---------+--------+--+--------+--+---------+         Summary:  Right Carotid: Velocities in the right ICA are consistent with a 1-39%  stenosis.   Left Carotid: Velocities in the left ICA are consistent with a 60-79%  stenosis.               The ECA appears >50% stenosed.   Vertebrals:  Bilateral vertebral arteries demonstrate antegrade flow.  Subclavians: Normal flow hemodynamics were seen in bilateral  subclavian               arteries.   +----------+--------+-----+--------+---------+--------+  LEFT     PSV cm/sRatioStenosisWaveform Comments  +----------+--------+-----+--------+---------+--------+  EIA Prox  125                  biphasic           +----------+--------+-----+--------+---------+--------+  CFA Prox  31                   triphasic          +----------+--------+-----+--------+---------+--------+  SFA Prox  40                   triphasic          +----------+--------+-----+--------+---------+--------+  SFA Mid   52                   triphasic          +----------+--------+-----+--------+---------+--------+  SFA Distal60                   biphasic           +----------+--------+-----+--------+---------+--------+     Left Graft #1: SFA to BK bypass graft.  +--------------------+--------+--------+---------+--------+                     PSV cm/sStenosisWaveform Comments  +--------------------+--------+--------+---------+--------+  Inflow             76              triphasic          +--------------------+--------+--------+---------+--------+  Proximal Anastomosis33              triphasic          +--------------------+--------+--------+---------+--------+  Proximal Graft      60              triphasic          +--------------------+--------+--------+---------+--------+  Mid Graft           53              triphasic          +--------------------+--------+--------+---------+--------+  Distal Graft        62              triphasic          +--------------------+--------+--------+---------+--------+  Distal Anastomosis  92              biphasic           +--------------------+--------+--------+---------+--------+  Outflow            46              biphasic           +--------------------+--------+--------+---------+--------+      Summary:  Left: Patent left SFA to below knee  bypass graft with no visualized  stenosis.   ABI Findings:  +---------+------------------+-----+----------+--------+  Right   Rt Pressure (mmHg)IndexWaveform  Comment   +---------+------------------+-----+----------+--------+  Brachial 138                                        +---------+------------------+-----+----------+--------+  PTA     153               1.10 monophasicbrisk     +---------+------------------+-----+----------+--------+  DP      151               1.09 monophasicbrisk     +---------+------------------+-----+----------+--------+  Great Toe85                0.61 Abnormal            +---------+------------------+-----+----------+--------+   +---------+------------------+-----+----------+-------+  Left    Lt Pressure (mmHg)IndexWaveform  Comment  +---------+------------------+-----+----------+-------+  Brachial 139                                       +---------+------------------+-----+----------+-------+  PTA     156               1.12 biphasic           +---------+------------------+-----+----------+-------+  DP      151               1.09 monophasicbrisk    +---------+------------------+-----+----------+-------+  Great Toe98                0.71 Normal             +---------+------------------+-----+----------+-------+   +-------+-----------+-----------+------------+------------+  ABI/TBIToday's ABIToday's TBIPrevious ABIPrevious TBI  +-------+-----------+-----------+------------+------------+  Right 1.10       0.61       0.94        0.89          +-------+-----------+-----------+------------+------------+  Left  1.12       0.71       0.48        0.00          +-------+-----------+-----------+------------+------------+      Right TBIs appear decreased. Left ABIs and TBIs appear increased.    Summary:  Right: Resting right ankle-brachial index is within normal range. The  right  toe-brachial index is abnormal.   Left: Resting left ankle-brachial index is within normal range. The left  toe-brachial index  is normal.    ASSESSMENT/PLAN:   Yeison Gago is a 72 y.o. male who is s/p left popliteal and TPT endarterectomy with removal of Viabahn stent, and left SFA to TPT bypass with ipsilateral, nonreversed translocated GSV on 01/25/2022 by Dr. Donzetta Matters.   His incisions have all healed.  He denise claudication, rest pain or non healing.  He is walking and playing some golf daily.   His CC is forefoot numbness that has been there prior to the surgery.  Right TBIs appear decreased. Left ABIs and TBIs appear increased.  The arterial duplex demonstrates triphasic wave formes and patent without stenosis.  The carotid duplex Right Carotid: Velocities in the right ICA are consistent with a 1-39% , the left ICA 60-79% stenosis EDV 84.  He remains asymptomatic for stroke/TIA .  He will f/u in 6 months for repeat carotid duplex.  If he has symptoms he will call 911.  If the ICA stenosis is > 80 % he will get a CTA of the neck and follow up with Dr. Donzetta Matters.          Roxy Horseman PA-C [facility] Office: [phone]  MD in clinic [place]

## 2022-05-06 ENCOUNTER — Other Ambulatory Visit: Payer: Self-pay

## 2022-05-06 DIAGNOSIS — I6523 Occlusion and stenosis of bilateral carotid arteries: Secondary | ICD-10-CM

## 2022-05-07 ENCOUNTER — Other Ambulatory Visit: Payer: Self-pay | Admitting: Internal Medicine

## 2022-05-27 DIAGNOSIS — M1612 Unilateral primary osteoarthritis, left hip: Secondary | ICD-10-CM | POA: Diagnosis not present

## 2022-05-27 DIAGNOSIS — M7062 Trochanteric bursitis, left hip: Secondary | ICD-10-CM | POA: Insufficient documentation

## 2022-05-27 DIAGNOSIS — M76892 Other specified enthesopathies of left lower limb, excluding foot: Secondary | ICD-10-CM | POA: Diagnosis not present

## 2022-06-06 DIAGNOSIS — M545 Low back pain, unspecified: Secondary | ICD-10-CM | POA: Diagnosis not present

## 2022-06-06 DIAGNOSIS — M9903 Segmental and somatic dysfunction of lumbar region: Secondary | ICD-10-CM | POA: Diagnosis not present

## 2022-06-07 NOTE — Progress Notes (Unsigned)
Cardiology Office Note Date:  06/07/2022  Patient ID:  Ronald SpangleJames R Hennings, DOB Aug 01, 1950, MRN [id number] PCP:  Reubin MilanBerglund, Laura H, MD  Cardiologist:  Yvonne Kendallhristopher End, MD Electrophysiologist: Lanier PrudeAMERON T LAMBERT, MD  ***refresh   Chief Complaint: ***  History of Present Illness: Charyl BiggerJames R Suitt is a 72 y.o. male with PMH notable for HFrEF, NICM, PVCs, CRT-D, PAD; seen today for Lanier PrudeAMERON T LAMBERT, MD for routine electrophysiology followup.  He last saw Dr. Lalla BrothersLambert 11/2021. Has historically low BiV pacing d/t PVCs. Amiodarone had been started earlier in 2023, but patient had stopped d/t side effects of feeling poorly. Mexiletine was started at that visit and [facility], planned to initiate sotalol.   *** fluid status *** how taking meds *** taking mex? *** BiV pacing  Since last being seen in our clinic the patient reports doing ***.  he denies chest pain, palpitations, dyspnea, PND, orthopnea, nausea, vomiting, dizziness, syncope, edema, weight gain, or early satiety.     Device Information: [employer] CRT-D, 03/23/2020; dx ICM  AAD History: Amiodarone - 07/2021 - dc'd sometime prior to 11/2021. Pt felt poorly Mexiletine - 11/2021  Past Medical History:  Diagnosis Date   Abnormal nuclear cardiac imaging test 08/08/2015   AICD (automatic cardioverter/defibrillator) present    pacemaker/defib [employer]   Arthritis    fingers   Carotid artery occlusion    CHF (congestive heart failure) (HCC)    Coronary atherosclerosis of native coronary artery 01/29/2013   11/05/19 R/LHC 80% dLMCA stenosis small diffusely dz dLAD, chronically occluded OM1 50%mRCA lesion, widely patent mLCx strent, moderately elevated L heart filling pressures, mild to moderate RH filling pressures, normal to moderately reduced CO   Duodenal erosion    Encounter for screening for lung cancer 07/13/2016   Esophageal stenosis    esophageal dilation   GERD (gastroesophageal reflux disease)    H. pylori  infection    Heart attack (HCC) 11/2007   Mild   Hiatal hernia    Hyperlipidemia    Hypertension    Ischemic leg 12/27/2021   Old myocardial infarction 11/29/2007   Mildly elevated troponin, isolated value in October 2009. Cardiac catheterization-nonobstructive 60% RCA disease-subsequent nuclear stress test-9 minutes, low risk, mild inferior wall hypokinesis    Pain in limb 12/19/2017   Peripheral vascular disease (HCC)    Unstable angina (HCC) 11/25/2017    Past Surgical History:  Procedure Laterality Date   ABDOMINAL AORTOGRAM W/LOWER EXTREMITY Left 01/24/2022   Procedure: ABDOMINAL AORTOGRAM W/LOWER EXTREMITY;  Surgeon: Maeola Harmanain, Brandon Christopher, MD;  [facility];  Service: Cardiovascular;  Laterality: Left;   APPENDECTOMY     BACK SURGERY     BIV ICD INSERTION CRT-D N/A 03/23/2020   Procedure: BIV ICD INSERTION CRT-D;  Surgeon: Lanier PrudeLambert, Cameron T, MD;  [facility];  Service: Cardiovascular;  Laterality: N/A;   CARDIAC CATHETERIZATION N/A 08/07/2015   Procedure: Left Heart Cath and Coronary Angiography;  Surgeon: Jake BatheMark C Skains, MD;  [facility]: [facility];  Service: Cardiovascular;  Laterality: N/A;   CARDIAC CATHETERIZATION N/A 08/07/2015   Procedure: Coronary Stent Intervention;  Surgeon: Jake BatheMark C Skains, MD;  Location: [facility] CV LAB;  Service: Cardiovascular;  Laterality: N/A;   CARDIAC CATHETERIZATION N/A 08/07/2015   Procedure: Coronary Stent Intervention;  Surgeon: Peter M SwazilandJordan, MD;  [facility];  Service: Cardiovascular;  Laterality: N/A;   CAROTID ENDARTERECTOMY  01/05/2006   Right  CEA with  DPA   CATARACT EXTRACTION W/ INTRAOCULAR LENS IMPLANT Left 12/04/2017   CATARACT EXTRACTION W/PHACO Left 12/04/2017   Procedure: CATARACT EXTRACTION PHACO AND INTRAOCULAR LENS PLACEMENT (IOC) LEFT;  Surgeon: Nevada Crane, MD;  Location[facility];  Service: Ophthalmology;  Laterality: Left;   CATARACT EXTRACTION  W/PHACO Right 02/06/2018   Procedure: CATARACT EXTRACTION PHACO AND INTRAOCULAR LENS PLACEMENT (IOC)RIGHT;  Surgeon: Nevada Crane, MD;  Location: [facility];  Service: Ophthalmology;  Laterality: Right;   COLONOSCOPY  05/20/2008   COLONOSCOPY WITH PROPOFOL N/A 09/13/2018   Procedure: COLONOSCOPY WITH BIOPSY;  Surgeon: Midge Minium, MD;  Location: [facility];  Service: Endoscopy;  Laterality: N/A;   CORONARY ARTERY BYPASS GRAFT N/A 11/11/2019   Procedure: CORONARY ARTERY BYPASS GRAFTING (CABG) USING LIMA to Diag1; ENDOSCOPICALLY HARVESTED RIGHT GREATER SAPHENOUS VEIN: SVG to OM1; SVG to OM2; SVG to PDA.;  Surgeon: Kerin Perna, MD;  Location: [facility] OR;  Service: Open Heart Surgery;  Laterality: N/A;   CORONARY STENT PLACEMENT  08/07/2015   MID CIRCUMFLEX   ENDARTERECTOMY FEMORAL Bilateral 09/30/2020   Procedure: ENDARTERECTOMY FEMORAL;  Surgeon: Annice Needy, MD;  Location: [facility] ORS;  Service: Vascular;  Laterality: Bilateral;   ENDARTERECTOMY POPLITEAL Left 01/25/2022   Procedure: ENDARTERECTOMY POPLITEAL;  Surgeon: Maeola Harman, MD;  Location[facility] OR;  Service: Vascular;  Laterality: Left;   ENDOVEIN HARVEST OF GREATER SAPHENOUS VEIN Right 11/11/2019   Procedure: ENDOVEIN HARVEST OF GREATER SAPHENOUS VEIN;  Surgeon: Kerin Perna, MD;  [facility] OR;  Service: Open Heart Surgery;  Laterality: Right;   ESOPHAGOGASTRODUODENOSCOPY (EGD) WITH PROPOFOL N/A 02/11/2019   Procedure: ESOPHAGOGASTRODUODENOSCOPY (EGD) WITH BIOPSY and  Dilation;  Surgeon: Midge Minium, MD;  Location: [facility];  Service: Endoscopy;  Laterality: N/A;   ESOPHAGOGASTRODUODENOSCOPY (EGD) WITH PROPOFOL N/A 12/23/2021   Procedure: ESOPHAGOGASTRODUODENOSCOPY (EGD) WITH PROPOFOL;  Surgeon: Midge Minium, MD;  Location: [facility] ENDOSCOPY;  Service: Endoscopy;  Laterality: N/A;   FEMORAL-FEMORAL BYPASS GRAFT Left 01/25/2022   Procedure: left Femoral to below the knee Popliteal bypass.;   Surgeon: Maeola Harman, MD;  Location: [facility] OR;  Service: Vascular;  Laterality: Left;   HIP SURGERY Left 10/2016   left hip tendon repair   LEFT HEART CATH AND CORONARY ANGIOGRAPHY N/A 11/27/2017   Procedure: LEFT HEART CATH AND CORONARY ANGIOGRAPHY;  Surgeon: Iran Ouch, MD;  Location: [facility] INVASIVE CV LAB;  Service: Cardiovascular;  Laterality: N/A;   LOWER EXTREMITY ANGIOGRAPHY Left 02/12/2018   Procedure: LOWER EXTREMITY ANGIOGRAPHY;  Surgeon: Annice Needy, MD;  Location: [facility] INVASIVE CV LAB;  Service: Cardiovascular;  Laterality: Left;   LOWER EXTREMITY ANGIOGRAPHY Left 03/07/2018   Procedure: LOWER EXTREMITY ANGIOGRAPHY;  Surgeon: Annice Needy, MD;  Location: [facility] INVASIVE CV LAB;  Service: Cardiovascular;  Laterality: Left;   LOWER EXTREMITY ANGIOGRAPHY Left 06/04/2018   Procedure: LOWER EXTREMITY ANGIOGRAPHY;  Surgeon: Annice Needy, MD;  Location: [facility] INVASIVE CV LAB;  Service: Cardiovascular;  Laterality: Left;   LOWER EXTREMITY ANGIOGRAPHY Left 09/17/2020   Procedure: LOWER EXTREMITY ANGIOGRAPHY;  Surgeon: Annice Needy, MD;  Location: [facility] INVASIVE CV LAB;  Service: Cardiovascular;  Laterality: Left;   LOWER EXTREMITY ANGIOGRAPHY Left 09/28/2020   Procedure: LOWER EXTREMITY ANGIOGRAPHY;  Surgeon: Annice Needy, MD;  Location: [facility] INVASIVE CV LAB;  Service: Cardiovascular;  Laterality: Left;   LOWER EXTREMITY ANGIOGRAPHY N/A 02/15/2021   Procedure: LOWER EXTREMITY ANGIOGRAPHY;  Surgeon: Annice Needy, MD;  Location: [facility] INVASIVE CV LAB;  Service: Cardiovascular;  Laterality: N/A;   LOWER EXTREMITY ANGIOGRAPHY Left 12/27/2021   Procedure: Lower Extremity Angiography;  Surgeon: Annice Needy, MD;  Location: [facility] INVASIVE CV LAB;  Service: Cardiovascular;  Laterality: Left;   LOWER EXTREMITY ANGIOGRAPHY Left 12/28/2021   Procedure: Lower Extremity Angiography;  Surgeon: Annice Needy, MD;  Location: [facility] INVASIVE CV LAB;  Service: Cardiovascular;  Laterality: Left;   PLACEMENT OF  IMPELLA LEFT VENTRICULAR ASSIST DEVICE N/A 11/11/2019   Procedure: PLACEMENT OF IMPELLA LEFT VENTRICULAR ASSIST DEVICE 5.5;  Surgeon: Kerin Perna, MD;  Location: [facility] OR;  Service: Open Heart Surgery;  Laterality: N/A;  Midline Sternotomy   POLYPECTOMY N/A 09/13/2018   Procedure: POLYPECTOMY;  Surgeon: Midge Minium, MD;  Location: [facility];  Service: Endoscopy;  Laterality: N/A;   POLYPECTOMY N/A 02/11/2019   Procedure: POLYPECTOMY;  Surgeon: Midge Minium, MD;  Location: [facility];  Service: Endoscopy;  Laterality: N/A;   REMOVAL OF IMPELLA LEFT VENTRICULAR ASSIST DEVICE N/A 11/15/2019   Procedure: REMOVAL OF IMPELLA 5.5 LEFT VENTRICULAR ASSIST DEVICE;  Surgeon: Kerin Perna, MD;  Location: [facility] OR;  Service: Open Heart Surgery;  Laterality: N/A;   RIGHT/LEFT HEART CATH AND CORONARY ANGIOGRAPHY N/A 11/05/2019   Procedure: RIGHT/LEFT HEART CATH AND CORONARY ANGIOGRAPHY;  Surgeon: Yvonne Kendall, MD;  Location: [facility] INVASIVE CV LAB;  Service: Cardiovascular;  Laterality: N/A;   SPINE SURGERY     TEE WITHOUT CARDIOVERSION N/A 11/11/2019   Procedure: TRANSESOPHAGEAL ECHOCARDIOGRAM (TEE);  Surgeon: Donata Clay, Theron Arista, MD;  Location: [facility] OR;  Service: Open Heart Surgery;  Laterality: N/A;   TEE WITHOUT CARDIOVERSION N/A 11/15/2019   Procedure: TRANSESOPHAGEAL ECHOCARDIOGRAM (TEE);  Surgeon: Donata Clay, Theron Arista, MD;  Location: [facility] OR;  Service: Open Heart Surgery;  Laterality: N/A;   TONSILLECTOMY      Current Outpatient Medications  Medication Instructions   aspirin EC 81 mg, Oral, Daily   carvedilol (COREG) 12.5 MG tablet TAKE (1) TABLET BY MOUTH TWICE DAILY   colchicine 0.6 mg, Oral, 2 times daily   mexiletine (MEXITIL) 150 mg, Oral, 2 times daily   nitroGLYCERIN (NITROSTAT) 0.4 mg, Sublingual, Every 5 min PRN   oxyCODONE-acetaminophen (PERCOCET) 5-325 MG tablet 1 tablet, Oral, Every 8 hours PRN   Praluent 150 mg, Subcutaneous, Every 14 days   sacubitril-valsartan (ENTRESTO)  49-51 MG 1 tablet, Oral, 2 times daily   traZODone (DESYREL) 50 mg, Oral, At bedtime PRN, for sleep    Social History:  The patient  reports that he quit smoking about 17 years ago. His smoking use included cigarettes. He has a 43.75 pack-year smoking history. He has never been exposed to tobacco smoke. His smokeless tobacco use includes snuff. He reports current alcohol use of about 10.0 standard drinks of alcohol per week. He reports that he does not use drugs.   Family History:  *** include only if pertinent The patient's family history includes Cancer (age of onset: 53) in his father; Coronary artery disease in his mother; Diabetes in his mother; Heart attack in his father and mother; Heart disease in his father and mother; Hypertension in his father and mother; Stroke in his father.***  ROS:  Please see the history of present illness. All other systems are reviewed and otherwise negative.   PHYSICAL EXAM: *** VS:  There were no vitals taken for this visit. BMI: There is no height or weight on file to calculate BMI.  GEN- The patient is well appearing, alert and oriented x 3 today.  Lungs- Clear to ausculation bilaterally, normal work of breathing.  Heart- {Blank single:19197::"Regular","Irregularly irregular"} rate and rhythm, no murmurs, rubs or gallops Extremities- {EDEMA LEVEL:28147::"No"} peripheral edema, warm, dry Skin-  *** device pocket well-healed     Device interrogation done today and reviewed by myself:  Battery *** Lead thresholds, impedence, sensing stable *** BiV pacing *** *** episodes *** changes made today  EKG {ACTION; IS/IS RVU:[id number]} ordered. Personal review of EKG from {Blank single:19197::"today","***"} shows:  ***  Recent Labs: 03/22/2022: ALT 21; BUN 20; Creatinine, Ser 1.00; Hemoglobin 14.1; Platelets 200; Potassium 5.0; Sodium 140; TSH 2.510  03/22/2022: Chol/HDL Ratio 2.5; Cholesterol, Total 135; HDL 55; LDL Chol Calc (NIH) 59; Triglycerides 120    CrCl cannot be calculated (Patient's most recent lab result is older than the maximum 21 days allowed.).   Wt Readings from Last 3 Encounters:  05/04/22 184 lb (83.5 kg)  03/22/22 179 lb (81.2 kg)  03/02/22 179 lb (81.2 kg)     Additional studies reviewed include: Previous EP, cardiology notes.   TTE, 08/10/2020  1. Left ventricular ejection fraction, by estimation, is 50 to 55%. The left ventricle has low normal function. The left ventricle has no regional wall motion abnormalities. The left ventricular internal cavity size was mildly dilated. Left ventricular diastolic parameters are indeterminate. The average left ventricular global longitudinal strain is -14.0 %. The global longitudinal strain is  abnormal.   2. Right ventricular systolic function is normal. The right ventricular size is normal. Tricuspid regurgitation signal is inadequate for assessing PA pressure.   3. Left atrial size was mildly dilated.   4. The mitral valve is normal in structure. Mild mitral valve regurgitation. No evidence of mitral stenosis.   5. The aortic valve is normal in structure. Aortic valve regurgitation is trivial. Mild aortic valve sclerosis is present, with no evidence of aortic valve stenosis.   ASSESSMENT AND PLAN:  #) CRT-D in situ #) PVCs Device functioning well, see paceart for details Tolerating mexiletine well Will discuss inpatient sotalol loading with MD    #) high risk medication monitoring - amiodarone Self-stopped amiodarone d/t side effects Update amiodarone labs given Tanabe half-life of medication    Current medicines are reviewed at length with the patient today.   The patient {ACTIONS; HAS/DOES NOT HAVE:19233} concerns regarding his medicines.  The following changes were made today:  {NONE DEFAULTED:18576}  Labs/ tests ordered today include: *** No orders of the defined types were placed in this encounter.    Disposition: Follow up with {EPMDS:28135} in {EPFOLLOW  UP:28173}   Signed, Sherie Don, NP  06/07/22  10:19 AM  Electrophysiology [facility]

## 2022-06-08 ENCOUNTER — Encounter: Payer: Self-pay | Admitting: Pharmacist

## 2022-06-08 ENCOUNTER — Ambulatory Visit: Payer: PPO | Attending: Cardiology | Admitting: Cardiology

## 2022-06-08 ENCOUNTER — Encounter: Payer: Self-pay | Admitting: Cardiology

## 2022-06-08 ENCOUNTER — Encounter: Payer: PPO | Admitting: Cardiology

## 2022-06-08 VITALS — BP 122/90 | HR 76 | Ht 66.0 in | Wt 183.0 lb

## 2022-06-08 DIAGNOSIS — T466X5A Adverse effect of antihyperlipidemic and antiarteriosclerotic drugs, initial encounter: Secondary | ICD-10-CM

## 2022-06-08 DIAGNOSIS — I493 Ventricular premature depolarization: Secondary | ICD-10-CM | POA: Diagnosis not present

## 2022-06-08 DIAGNOSIS — I5022 Chronic systolic (congestive) heart failure: Secondary | ICD-10-CM

## 2022-06-08 DIAGNOSIS — I2510 Atherosclerotic heart disease of native coronary artery without angina pectoris: Secondary | ICD-10-CM

## 2022-06-08 DIAGNOSIS — Z95810 Presence of automatic (implantable) cardiac defibrillator: Secondary | ICD-10-CM | POA: Diagnosis not present

## 2022-06-08 NOTE — Patient Instructions (Addendum)
Medication Instructions:  Your physician recommends that you continue on your current medications as directed. Please refer to the Current Medication list given to you today.  *If you need a refill on your cardiac medications before your next appointment, please call your pharmacy*   Lab Work: No labs ordered If you have labs (blood work) drawn today and your tests are completely normal, you will receive your results only by: MyChart Message (if you have MyChart) OR A paper copy in the mail If you have any lab test that is abnormal or we need to change your treatment, we will call you to review the results.   Testing/Procedures: Your physician has requested that you have an echocardiogram. Echocardiography is a painless test that uses sound waves to create images of your heart. It provides your doctor with information about the size and shape of your heart and how well your heart's chambers and valves are working. This procedure takes approximately one hour. There are no restrictions for this procedure. Please do NOT wear cologne, perfume, aftershave, or lotions (deodorant is allowed). Please arrive 15 minutes prior to your appointment time.  Follow-Up: At [facility], you and your health needs are our priority.  As part of our continuing mission to provide you with exceptional heart care, we have created designated Provider Care Teams.  These Care Teams include your primary Cardiologist (physician) and Advanced Practice Providers (APPs -  Physician Assistants and Nurse Practitioners) who all work together to provide you with the care you need, when you need it.  We recommend signing up for the patient portal called "MyChart".  Sign up information is provided on this After Visit Summary.  MyChart is used to connect with patients for Virtual Visits (Telemedicine).  Patients are able to view lab/test results, encounter notes, upcoming appointments, etc.  Non-urgent messages can be sent  to your provider as well.   To learn more about what you can do with MyChart, go to [email or web address].    Your next appointment:   6 month(s)  Provider:   Dr. Steffanie Dunn

## 2022-06-08 NOTE — Progress Notes (Signed)
[facility])     [facility] Quality Pharmacy Team Statin Quality Measure Assessment  06/08/2022  ALAN BERKENPAS [birth year] [phone]  Per review of chart and payor information, patient has a diagnosis of cardiovascular disease but is not currently filling a statin prescription. This places patient into the [facility] (Statin Use In Patients with Cardiovascular Disease) measure for CMS.    Patient has documented trials of multiple statins  with reported muscle pain, but no corresponding CPT codes that would exclude patient from [facility] measure.     Component Value Date/Time   CHOL 135 03/22/2022 0839   TRIG 120 03/22/2022 0839   HDL 55 03/22/2022 0839   CHOLHDL 2.5 03/22/2022 0839   CHOLHDL 2.3 01/07/2020 1601   VLDL 32 01/07/2020 1601   LDLCALC 59 03/22/2022 0839    Please consider ONE of the following recommendations:  Initiate high intensity statin Atorvastatin 40mg  once daily, #90, 3 refills   Rosuvastatin 20mg  once daily, #90, 3 refills    Initiate moderate intensity  statin with reduced frequency if prior  statin intolerance 1x weekly, #13, 3 refills   2x weekly, #26, 3 refills   3x weekly, #39, 3 refills    Code for past statin intolerance  (required annually)  Provider Requirements: Must associate code during an office visit or telehealth encounter   Drug Induced Myopathy G72.0   Myalgia (SPC ONLY) M79.1   Myositis, unspecified M60.9   Myopathy, unspecified G72.9   Rhabdomyolysis M62.82   Thank you for allowing [facility] to be a part of this patient's care.

## 2022-06-09 LAB — CUP PACEART INCLINIC DEVICE CHECK
Battery Remaining Longevity: 44 mo
Brady Statistic RA Percent Paced: 85 %
Brady Statistic RV Percent Paced: 86 %
Date Time Interrogation Session: 20240410094400
Date Time Interrogation Session: 20240410131457
HighPow Impedance: 64.125
Implantable Lead Connection Status: 753985
Implantable Lead Connection Status: 753985
Implantable Lead Connection Status: 753985
Implantable Lead Connection Status: 753985
Implantable Lead Connection Status: 753985
Implantable Lead Connection Status: 753985
Implantable Lead Implant Date: 20220124
Implantable Lead Implant Date: 20220124
Implantable Lead Implant Date: 20220124
Implantable Lead Implant Date: 20220124
Implantable Lead Implant Date: 20220124
Implantable Lead Implant Date: 20220124
Implantable Lead Location: 753858
Implantable Lead Location: 753859
Implantable Lead Location: 753860
Implantable Lead Location: 753858
Implantable Lead Location: 753859
Implantable Lead Location: 753860
Implantable Pulse Generator Implant Date: 20220124
Implantable Pulse Generator Implant Date: 20220124
Lead Channel Impedance Value: 475 Ohm
Lead Channel Impedance Value: 487.5 Ohm
Lead Channel Impedance Value: 575 Ohm
Lead Channel Pacing Threshold Amplitude: 0.75 V
Lead Channel Pacing Threshold Amplitude: 0.75 V
Lead Channel Pacing Threshold Amplitude: 0.75 V
Lead Channel Pacing Threshold Amplitude: 0.75 V
Lead Channel Pacing Threshold Amplitude: 1.25 V
Lead Channel Pacing Threshold Amplitude: 1.25 V
Lead Channel Pacing Threshold Amplitude: 2.25 V
Lead Channel Pacing Threshold Pulse Width: 0.5 ms
Lead Channel Pacing Threshold Pulse Width: 0.5 ms
Lead Channel Pacing Threshold Pulse Width: 0.5 ms
Lead Channel Pacing Threshold Pulse Width: 0.5 ms
Lead Channel Pacing Threshold Pulse Width: 0.5 ms
Lead Channel Pacing Threshold Pulse Width: 0.5 ms
Lead Channel Pacing Threshold Pulse Width: 0.5 ms
Lead Channel Sensing Intrinsic Amplitude: 12 mV
Lead Channel Sensing Intrinsic Amplitude: 2.6 mV
Lead Channel Setting Pacing Amplitude: 1.75 V
Lead Channel Setting Pacing Amplitude: 2 V
Lead Channel Setting Pacing Amplitude: 2 V
Lead Channel Setting Pacing Pulse Width: 0.5 ms
Lead Channel Setting Pacing Pulse Width: 0.5 ms
Lead Channel Setting Sensing Sensitivity: 0.5 mV
Pulse Gen Serial Number: 810017138
Pulse Gen Serial Number: 810017138
Zone Setting Status: 755011

## 2022-06-09 NOTE — Progress Notes (Signed)
Cardiology Office Note:    Date:  06/09/2022   ID:  [name], DOB 01/23/1951, MRN [id number]  PCP:  Ronald Milan, MD   [facility] Providers Cardiologist:  Ronald Kendall, MD Electrophysiologist:  Ronald Prude, MD     Referring MD: Ronald Milan, MD   CC: follow up for his CAD  History of Present Illness:    [name] is a 72 y.o. male with a hx of CAD with NSTEMI 07/2015 s/p PCI to Lcx, s/p CABG x 4 10/2019, NICM, chronic HFrEF, PVCs, s/p CRT-D 02/2020, hypertension, ectatic abdominal aorta, PVD, carotid stenosis s/p R CEA, LBBB, HLD, myalgia due to statin use, PAF s/p immediate postop period surrounding CABG.  Repeat LHC in 11/17/2017 with widely patent left circumflex stent without significant restenosis, otherwise mild to moderate CAD, EF 40 to 45%.  Presented again in August 2021 with DOE and abnormal EKG, subsequent LHC showed multivessel CAD with development of severe distal LMCA disease.  He ultimately underwent CABG x 4 (LIMA-LAD, SVG-OM1, SVG-OM 2, SVG-PDA) requiring Impella, which was ultimately removed POD 4.  He did have postoperative atrial fibrillation with resumption of NSR initiation of IV amiodarone.  History of PAD and underwent mechanical thrombectomy and catheter through the lysis of the left SFA in April 2020.  S/p R CEA with carotid Dopplers in September 2021 showing 40 to 59% LICA.  Repeat echocardiogram in 2022 revealed an EF of 30 to 35%, global hypokinesis with segmental lateral dyssynchrony due to LBBB, mild LVH, grade 1 DD.  Given his persistently low EF and chronic LBBB B, he was recommended for CRT-D implementation.  On 03/23/2020 he underwent CRT-D [facility] device without any complications.  Most recently was evaluated by [name] on 06/08/2022 for device check, at that time he was doing well from a cardiac perspective.  He was recovering from femoropopliteal bypass and doing well.  Playing golf multiple times a week.  He was  feeling well on mexiletine, was not interested in sotalol admission.  Due to his ongoing PVC burden and updated echo was ordered which is yet to be completed.  He presents today for follow-up of his CAD.  He has been doing well from a cardiac perspective.  He plays golf multiple times per week and is relatively active.  He does have concerns with affording his Praluent, states he only has a month supply left and he will not be able to afford it any longer.  He has been intolerant of multiple statins and Zetia. He denies chest pain, palpitations, dyspnea, pnd, orthopnea, n, v, dizziness, syncope, edema, weight gain, or early satiety.   Past Medical History:  Diagnosis Date   Abnormal nuclear cardiac imaging test 08/08/2015   AICD (automatic cardioverter/defibrillator) present    pacemaker/defib [employer]   Arthritis    fingers   Carotid artery occlusion    CHF (congestive heart failure)    Coronary atherosclerosis of native coronary artery 01/29/2013   11/05/19 R/LHC 80% dLMCA stenosis small diffusely dz dLAD, chronically occluded OM1 50%mRCA lesion, widely patent mLCx strent, moderately elevated L heart filling pressures, mild to moderate RH filling pressures, normal to moderately reduced CO   Duodenal erosion    Encounter for screening for lung cancer 07/13/2016   Esophageal stenosis    esophageal dilation   GERD (gastroesophageal reflux disease)    H. pylori infection    Heart attack 11/2007   Mild   Hiatal hernia  Hyperlipidemia    Hypertension    Ischemic leg 12/27/2021   Old myocardial infarction 11/29/2007   Mildly elevated troponin, isolated value in October 2009. Cardiac catheterization-nonobstructive 60% RCA disease-subsequent nuclear stress test-9 minutes, low risk, mild inferior wall hypokinesis    Pain in limb 12/19/2017   Peripheral vascular disease    Unstable angina 11/25/2017    Past Surgical History:  Procedure Laterality Date   ABDOMINAL AORTOGRAM W/LOWER  EXTREMITY Left 01/24/2022   Procedure: ABDOMINAL AORTOGRAM W/LOWER EXTREMITY;  Surgeon: Maeola Harman, MD;  Location: [facility] INVASIVE CV LAB;  Service: Cardiovascular;  Laterality: Left;   APPENDECTOMY     BACK SURGERY     BIV ICD INSERTION CRT-D N/A 03/23/2020   Procedure: BIV ICD INSERTION CRT-D;  Surgeon: Ronald Prude, MD;  Location: [facility] INVASIVE CV LAB;  Service: Cardiovascular;  Laterality: N/A;   CARDIAC CATHETERIZATION N/A 08/07/2015   Procedure: Left Heart Cath and Coronary Angiography;  Surgeon: Ronald Bathe, MD;  Location: [facility] INVASIVE CV LAB;  Service: Cardiovascular;  Laterality: N/A;   CARDIAC CATHETERIZATION N/A 08/07/2015   Procedure: Coronary Stent Intervention;  Surgeon: Ronald Bathe, MD;  Location: [facility] INVASIVE CV LAB;  Service: Cardiovascular;  Laterality: N/A;   CARDIAC CATHETERIZATION N/A 08/07/2015   Procedure: Coronary Stent Intervention;  Surgeon: Ronald M Swaziland, MD;  Location: [facility] INVASIVE CV LAB;  Service: Cardiovascular;  Laterality: N/A;   CAROTID ENDARTERECTOMY  01/05/2006   Right  CEA with DPA   CATARACT EXTRACTION W/ INTRAOCULAR LENS IMPLANT Left 12/04/2017   CATARACT EXTRACTION W/PHACO Left 12/04/2017   Procedure: CATARACT EXTRACTION PHACO AND INTRAOCULAR LENS PLACEMENT (IOC) LEFT;  Surgeon: Ronald Crane, MD;  Location: [facility] SURGERY CNTR;  Service: Ophthalmology;  Laterality: Left;   CATARACT EXTRACTION W/PHACO Right 02/06/2018   Procedure: CATARACT EXTRACTION PHACO AND INTRAOCULAR LENS PLACEMENT (IOC)RIGHT;  Surgeon: Ronald Crane, MD;  Location: [facility] SURGERY CNTR;  Service: Ophthalmology;  Laterality: Right;   COLONOSCOPY  05/20/2008   COLONOSCOPY WITH PROPOFOL N/A 09/13/2018   Procedure: COLONOSCOPY WITH BIOPSY;  Surgeon: Ronald Minium, MD;  Location: [facility] SURGERY [facility]TR;  Service: Endoscopy;  Laterality: N/A;   CORONARY ARTERY BYPASS GRAFT N/A 11/11/2019   Procedure: CORONARY ARTERY BYPASS GRAFTING (CABG) USING LIMA to Diag1; ENDOSCOPICALLY  HARVESTED RIGHT GREATER SAPHENOUS VEIN: SVG to OM1; SVG to OM2; SVG to PDA.;  Surgeon: Kerin Perna, MD;  Location: [facility] OR;  Service: Open Heart Surgery;  Laterality: N/A;   CORONARY STENT PLACEMENT  08/07/2015   MID CIRCUMFLEX   ENDARTERECTOMY FEMORAL Bilateral 09/30/2020   Procedure: ENDARTERECTOMY FEMORAL;  Surgeon: Annice Needy, MD;  Location: [facility] ORS;  Service: Vascular;  Laterality: Bilateral;   ENDARTERECTOMY POPLITEAL Left 01/25/2022   Procedure: ENDARTERECTOMY POPLITEAL;  Surgeon: Maeola Harman, MD;  Location: [facility] OR;  Service: Vascular;  Laterality: Left;   ENDOVEIN HARVEST OF GREATER SAPHENOUS VEIN Right 11/11/2019   Procedure: ENDOVEIN HARVEST OF GREATER SAPHENOUS VEIN;  Surgeon: Kerin Perna, MD;  Location: [facility] OR;  Service: Open Heart Surgery;  Laterality: Right;   ESOPHAGOGASTRODUODENOSCOPY (EGD) WITH PROPOFOL N/A 02/11/2019   Procedure: ESOPHAGOGASTRODUODENOSCOPY (EGD) WITH BIOPSY and  Dilation;  Surgeon: Ronald Minium, MD;  Location: [facility] SURGERY CNTR;  Service: Endoscopy;  Laterality: N/A;   ESOPHAGOGASTRODUODENOSCOPY (EGD) WITH PROPOFOL N/A 12/23/2021   Procedure: ESOPHAGOGASTRODUODENOSCOPY (EGD) WITH PROPOFOL;  Surgeon: Ronald Minium, MD;  Location: [facility] ENDOSCOPY;  Service: Endoscopy;  Laterality: N/A;   FEMORAL-FEMORAL BYPASS GRAFT Left 01/25/2022   Procedure: left  Femoral to below the knee Popliteal bypass.;  Surgeon: Maeola Harman, MD;  Location: [facility] OR;  Service: Vascular;  Laterality: Left;   HIP SURGERY Left 10/2016   left hip tendon repair   LEFT HEART CATH AND CORONARY ANGIOGRAPHY N/A 11/27/2017   Procedure: LEFT HEART CATH AND CORONARY ANGIOGRAPHY;  Surgeon: Iran Ouch, MD;  Location: [facility] INVASIVE CV LAB;  Service: Cardiovascular;  Laterality: N/A;   LOWER EXTREMITY ANGIOGRAPHY Left 02/12/2018   Procedure: LOWER EXTREMITY ANGIOGRAPHY;  Surgeon: Annice Needy, MD;  Location: [facility] INVASIVE CV LAB;  Service: Cardiovascular;  Laterality:  Left;   LOWER EXTREMITY ANGIOGRAPHY Left 03/07/2018   Procedure: LOWER EXTREMITY ANGIOGRAPHY;  Surgeon: Annice Needy, MD;  Location: [facility] INVASIVE CV LAB;  Service: Cardiovascular;  Laterality: Left;   LOWER EXTREMITY ANGIOGRAPHY Left 06/04/2018   Procedure: LOWER EXTREMITY ANGIOGRAPHY;  Surgeon: Annice Needy, MD;  Location: [facility] INVASIVE CV LAB;  Service: Cardiovascular;  Laterality: Left;   LOWER EXTREMITY ANGIOGRAPHY Left 09/17/2020   Procedure: LOWER EXTREMITY ANGIOGRAPHY;  Surgeon: Annice Needy, MD;  Location: [facility] INVASIVE CV LAB;  Service: Cardiovascular;  Laterality: Left;   LOWER EXTREMITY ANGIOGRAPHY Left 09/28/2020   Procedure: LOWER EXTREMITY ANGIOGRAPHY;  Surgeon: Annice Needy, MD;  Location: [facility] INVASIVE CV LAB;  Service: Cardiovascular;  Laterality: Left;   LOWER EXTREMITY ANGIOGRAPHY N/A 02/15/2021   Procedure: LOWER EXTREMITY ANGIOGRAPHY;  Surgeon: Annice Needy, MD;  Location: [facility] INVASIVE CV LAB;  Service: Cardiovascular;  Laterality: N/A;   LOWER EXTREMITY ANGIOGRAPHY Left 12/27/2021   Procedure: Lower Extremity Angiography;  Surgeon: Annice Needy, MD;  Location: [facility] INVASIVE CV LAB;  Service: Cardiovascular;  Laterality: Left;   LOWER EXTREMITY ANGIOGRAPHY Left 12/28/2021   Procedure: Lower Extremity Angiography;  Surgeon: Annice Needy, MD;  Location: [facility] INVASIVE CV LAB;  Service: Cardiovascular;  Laterality: Left;   PLACEMENT OF IMPELLA LEFT VENTRICULAR ASSIST DEVICE N/A 11/11/2019   Procedure: PLACEMENT OF IMPELLA LEFT VENTRICULAR ASSIST DEVICE 5.5;  Surgeon: Kerin Perna, MD;  Location: [facility] OR;  Service: Open Heart Surgery;  Laterality: N/A;  Midline Sternotomy   POLYPECTOMY N/A 09/13/2018   Procedure: POLYPECTOMY;  Surgeon: Ronald Minium, MD;  Location: [facility];  Service: Endoscopy;  Laterality: N/A;   POLYPECTOMY N/A 02/11/2019   Procedure: POLYPECTOMY;  Surgeon: Ronald Minium, MD;  Location: [facility];  Service: Endoscopy;  Laterality: N/A;   REMOVAL  OF IMPELLA LEFT VENTRICULAR ASSIST DEVICE N/A 11/15/2019   Procedure: REMOVAL OF IMPELLA 5.5 LEFT VENTRICULAR ASSIST DEVICE;  Surgeon: Kerin Perna, MD;  Location: [facility] OR;  Service: Open Heart Surgery;  Laterality: N/A;   RIGHT/LEFT HEART CATH AND CORONARY ANGIOGRAPHY N/A 11/05/2019   Procedure: RIGHT/LEFT HEART CATH AND CORONARY ANGIOGRAPHY;  Surgeon: Ronald Kendall, MD;  Location: [facility] INVASIVE CV LAB;  Service: Cardiovascular;  Laterality: N/A;   SPINE SURGERY     TEE WITHOUT CARDIOVERSION N/A 11/11/2019   Procedure: TRANSESOPHAGEAL ECHOCARDIOGRAM (TEE);  Surgeon: Donata Clay, Theron Arista, MD;  Location: [facility] OR;  Service: Open Heart Surgery;  Laterality: N/A;   TEE WITHOUT CARDIOVERSION N/A 11/15/2019   Procedure: TRANSESOPHAGEAL ECHOCARDIOGRAM (TEE);  Surgeon: Donata Clay, Theron Arista, MD;  Location: [facility] OR;  Service: Open Heart Surgery;  Laterality: N/A;   TONSILLECTOMY      Current Medications: No outpatient medications have been marked as taking for the 06/10/22 encounter (Appointment) with Sondra Barges, PA-C.     Allergies:   Brilinta [ticagrelor], Chlorhexidine gluconate, Contrast media [  iodinated contrast media], Statins, and Zetia [ezetimibe]   Social History   Socioeconomic History   Marital status: Married    Spouse name: Not on file   Number of children: 1   Years of education: Not on file   Highest education level: Bachelor's degree (e.g., BA, AB, BS)  Occupational History   Not on file  Tobacco Use   Smoking status: Former    Packs/day: 1.25    Years: 35.00    Additional pack years: 0.00    Total pack years: 43.75    Types: Cigarettes    Quit date: 02/28/2005    Years since quitting: 17.2    Passive exposure: Never   Smokeless tobacco: Current    Types: Snuff   Tobacco comments:    occaisionally  Vaping Use   Vaping Use: Never used  Substance and Sexual Activity   Alcohol use: Yes    Alcohol/week: 10.0 standard drinks of alcohol    Types: 10 Standard drinks or equivalent per  week    Comment: wine/liquor weekly   Drug use: No   Sexual activity: Yes  Other Topics Concern   Not on file  Social History Narrative   Not on file   Social Determinants of Health   Financial Resource Strain: Low Risk  (03/22/2022)   Overall Financial Resource Strain (CARDIA)    Difficulty of Paying Living Expenses: Not hard at all  Food Insecurity: No Food Insecurity (03/22/2022)   Hunger Vital Sign    Worried About Running Out of Food in the Last Year: Never true    Ran Out of Food in the Last Year: Never true  Transportation Needs: No Transportation Needs (03/22/2022)   PRAPARE - Administrator, Civil Service (Medical): No    Lack of Transportation (Non-Medical): No  Physical Activity: Insufficiently Active (09/23/2020)   Exercise Vital Sign    Days of Exercise per Week: 2 days    Minutes of Exercise per Session: 60 min  Stress: No Stress Concern Present (09/23/2020)   [employer] of Occupational Health - Occupational Stress Questionnaire    Feeling of Stress : Not at all  Social Connections: Moderately Integrated (09/23/2020)   Social Connection and Isolation Panel [NHANES]    Frequency of Communication with Friends and Family: More than three times a week    Frequency of Social Gatherings with Friends and Family: Three times a week    Attends Religious Services: Never    Active Member of Clubs or Organizations: Yes    Attends Engineer, structural: More than 4 times per year    Marital Status: Married     Family History: The patient's family history includes Cancer (age of onset: 49) in his father; Coronary artery disease in his mother; Diabetes in his mother; Heart attack in his father and mother; Heart disease in his father and mother; Hypertension in his father and mother; Stroke in his father. There is no history of Colon cancer, Colon polyps, Esophageal cancer, Rectal cancer, or Stomach cancer.  ROS:   Please see the history of present  illness.    All other systems reviewed and are negative.  EKGs/Labs/Other Studies Reviewed:    The following studies were reviewed today:  Cardiac Studies & Procedures   CARDIAC CATHETERIZATION  CARDIAC CATHETERIZATION 11/05/2019  Narrative Conclusions: 1. Multivessel CAD, including calcified 80% distal LMCA stenosis, small diffusely diseased distal LAD, chronically occluded OM1, and 50% mid RCA lesion. 2. Widely patent mid LCx stent.  3. Moderately elevated left heart filling pressure. 4. Mildly to moderately elevated right heart filling pressure. 5. Normal to mildly reduced cardiac output/index.  Recommendations: 1. Transfer to [facility] for cardiac surgery consultation for CABG, given severe LMCA disease and low LVEF. 2. Hold clopidogrel pending cardiac surgery consultation. 3. Obtain echocardiogram. 4. Aggressive secondary prevention.  Ronald Kendall, MD [facility]  Findings Coronary Findings Diagnostic  Dominance: Right  Left Main Vessel is large. Dist LM to Ost LAD lesion is 80% stenosed. The lesion is calcified.  Left Anterior Descending Vessel is moderate in size.  First Diagonal Branch Vessel is small in size.  Second Diagonal Branch Vessel is moderate in size.  Left Circumflex Previously placed Mid Cx stent (unknown type) is widely patent.  First Obtuse Marginal Branch Vessel is moderate in size. Collaterals 1st Mrg filled by collaterals from Dist LAD.  1st Mrg lesion is 100% stenosed. The lesion is chronically occluded with left-to-left collateral flow.  Second Obtuse Marginal Branch Vessel is small in size.  Third Obtuse Marginal Branch Vessel is moderate in size.  Fourth Obtuse Marginal Branch Vessel is moderate in size.  Right Coronary Artery Vessel is large. Mid RCA lesion is 50% stenosed. The lesion is eccentric.  Intervention  No interventions have been documented.   CARDIAC CATHETERIZATION  CARDIAC CATHETERIZATION  11/27/2017  Narrative  Prox RCA to Mid RCA lesion is 40% stenosed.  Prox LAD lesion is 30% stenosed.  Previously placed Mid Cx stent (unknown type) is widely patent.  Ost 2nd Mrg lesion is 30% stenosed.  Dist Cx lesion is 40% stenosed.  There is mild to moderate left ventricular systolic dysfunction.  LV end diastolic pressure is mildly elevated.  1.  Widely patent left circumflex stent with no significant restenosis.  Otherwise mild to moderate nonobstructive coronary artery disease. 2.  Mild to moderately reduced LV systolic function with an EF of 40 to 45%. 3.  Mildly elevated left ventricular end-diastolic pressure at 13 mmHg.  Recommendations: The patient's chest pain does not seem to be cardiac. He does have cardiomyopathy which seems to be nonischemic.  Continue treatment with a beta-blocker and ACE inhibitor.  Uptitrate as tolerated.  The patient can be discharged home from a cardiac standpoint.  Findings Coronary Findings Diagnostic  Dominance: Right  Left Main Vessel is angiographically normal.  Left Anterior Descending Prox LAD lesion is 30% stenosed.  Left Circumflex Previously placed Mid Cx stent (unknown type) is widely patent. Dist Cx lesion is 40% stenosed.  Second Obtuse Marginal Branch Ost 2nd Mrg lesion is 30% stenosed.  Right Coronary Artery Prox RCA to Mid RCA lesion is 40% stenosed.  Right Posterior Descending Artery The vessel exhibits minimal luminal irregularities.  Right Posterior Atrioventricular Artery Vessel is small in size.  Intervention  No interventions have been documented.   STRESS TESTS  NM MYOCAR MULTI W/SPECT W 11/25/2017  Narrative  Abnormal, potentially high risk pharmacologic myocardial perfusion stress test.  There is a large in size, mild to moderately severe, minimally reversible defect involving the septum and anterior walls, as well as the apex. While this may represent artifact from LBBB, anterior and  apical involvement raises the possibility of scar with periinfarct ischemia.  The left ventricular ejection fraction is severely decreased (29%) with global hypokinesis and a dyskinetic septum.   ECHOCARDIOGRAM  ECHOCARDIOGRAM COMPLETE 08/10/2020  Narrative ECHOCARDIOGRAM REPORT    Patient Name:   SHELTON KRELL Pellot Date of Exam: 08/10/2020 Medical Rec #:  [id number]  Height:       66.0 in Accession #:    [id number]   Weight:       184.0 lb Date of Birth:  06/17/1950   BSA:          1.930 m Patient Age:    69 years     BP:           116/74 mmHg Patient Gender: M            HR:           60 bpm. Exam Location:  [place]  Procedure: 2D Echo, Cardiac Doppler, Color Doppler and Strain Analysis  Indications:    I50.20* Unspecified systolic (congestive) heart failure  History:        Patient has prior history of Echocardiogram examinations, most recent 03/02/2020. CHF, CAD and Previous Myocardial Infarction, Prior CABG, PAD; Risk Factors:Hypertension and Dyslipidemia.  Sonographer:    Quentin OreGary Joseph RDMS, RVT, RDCS Referring Phys: [id number] Zack SealJACQUELYN D VISSER  IMPRESSIONS   1. Left ventricular ejection fraction, by estimation, is 50 to 55%. The left ventricle has low normal function. The left ventricle has no regional wall motion abnormalities. The left ventricular internal cavity size was mildly dilated. Left ventricular diastolic parameters are indeterminate. The average left ventricular global longitudinal strain is -14.0 %. The global longitudinal strain is abnormal. 2. Right ventricular systolic function is normal. The right ventricular size is normal. Tricuspid regurgitation signal is inadequate for assessing PA pressure. 3. Left atrial size was mildly dilated. 4. The mitral valve is normal in structure. Mild mitral valve regurgitation. No evidence of mitral stenosis. 5. The aortic valve is normal in structure. Aortic valve regurgitation is trivial. Mild aortic valve sclerosis is  present, with no evidence of aortic valve stenosis.  FINDINGS Left Ventricle: Left ventricular ejection fraction, by estimation, is 50 to 55%. The left ventricle has low normal function. The left ventricle has no regional wall motion abnormalities. The average left ventricular global longitudinal strain is -14.0 %. The global longitudinal strain is abnormal. The left ventricular internal cavity size was mildly dilated. There is no left ventricular hypertrophy. Left ventricular diastolic parameters are indeterminate.  Right Ventricle: The right ventricular size is normal. No increase in right ventricular wall thickness. Right ventricular systolic function is normal. Tricuspid regurgitation signal is inadequate for assessing PA pressure.  Left Atrium: Left atrial size was mildly dilated.  Right Atrium: Right atrial size was normal in size.  Pericardium: There is no evidence of pericardial effusion.  Mitral Valve: The mitral valve is normal in structure. Mild mitral valve regurgitation. No evidence of mitral valve stenosis.  Tricuspid Valve: The tricuspid valve is normal in structure. Tricuspid valve regurgitation is not demonstrated. No evidence of tricuspid stenosis.  Aortic Valve: The aortic valve is normal in structure. Aortic valve regurgitation is trivial. Mild aortic valve sclerosis is present, with no evidence of aortic valve stenosis. Aortic valve mean gradient measures 4.0 mmHg. Aortic valve peak gradient measures 7.1 mmHg. Aortic valve area, by VTI measures 2.57 cm.  Pulmonic Valve: The pulmonic valve was normal in structure. Pulmonic valve regurgitation is not visualized. No evidence of pulmonic stenosis.  Aorta: The aortic root is normal in size and structure.  Venous: The inferior vena cava was not well visualized.  IAS/Shunts: No atrial level shunt detected by color flow Doppler.  Additional Comments: A device lead is visualized.   LEFT VENTRICLE PLAX 2D LVIDd:          5.60  cm      Diastology LVIDs:         4.60 cm      LV e' medial:    5.33 cm/s LV PW:         0.80 cm      LV E/e' medial:  14.6 LV IVS:        0.80 cm      LV e' lateral:   7.18 cm/s LVOT diam:     2.30 cm      LV E/e' lateral: 10.8 LV SV:         76 LV SV Index:   39           2D Longitudinal Strain LVOT Area:     4.15 cm     2D Strain GLS Avg:     -14.0 %  LV Volumes (MOD) LV vol d, MOD A2C: 91.8 ml LV vol d, MOD A4C: 119.0 ml LV vol s, MOD A2C: 51.9 ml LV vol s, MOD A4C: 59.6 ml LV SV MOD A2C:     39.9 ml LV SV MOD A4C:     119.0 ml LV SV MOD BP:      52.9 ml  RIGHT VENTRICLE RV Basal diam:  3.90 cm RV S prime:     8.92 cm/s TAPSE (M-mode): 2.0 cm  LEFT ATRIUM           Index       RIGHT ATRIUM           Index LA diam:      4.30 cm 2.23 cm/m  RA Area:     15.20 cm LA Vol (A4C): 69.1 ml 35.80 ml/m RA Volume:   33.80 ml  17.51 ml/m AORTIC VALVE                   PULMONIC VALVE AV Area (Vmax):    2.98 cm    PV Vmax:       0.84 m/s AV Area (Vmean):   2.49 cm    PV Peak grad:  2.8 mmHg AV Area (VTI):     2.57 cm AV Vmax:           133.00 cm/s AV Vmean:          96.900 cm/s AV VTI:            0.296 m AV Peak Grad:      7.1 mmHg AV Mean Grad:      4.0 mmHg LVOT Vmax:         95.30 cm/s LVOT Vmean:        58.000 cm/s LVOT VTI:          0.183 m LVOT/AV VTI ratio: 0.62  AORTA Ao Root diam: 3.60 cm Ao Asc diam:  2.60 cm Ao Arch diam: 2.7 cm  MITRAL VALVE MV Area (PHT): 3.48 cm    SHUNTS MV Decel Time: 218 msec    Systemic VTI:  0.18 m MV E velocity: 77.60 cm/s  Systemic Diam: 2.30 cm MV A velocity: 90.80 cm/s MV E/A ratio:  0.85  Lorine Bears MD Electronically signed by Lorine Bears MD Signature Date/Time: 08/10/2020/5:52:19 PM    Final   TEE  ECHO INTRAOPERATIVE TEE 11/18/2019  Narrative *INTRAOPERATIVE TRANSESOPHAGEAL REPORT *    Patient Name:   WELDON NOURI Risk   Date of Exam: 11/15/2019 Medical Rec #:  [id number]      Height:       66.0  in  Accession #:    [id number]     Weight:       188.3 lb Date of Birth:  05-15-1950     BSA:          1.95 m Patient Age:    68 years       BP:           0/0 mmHg Patient Gender: M              HR:           89 bpm. Exam Location:  Anesthesiology  Transesophogeal exam was perform intraoperatively during surgical procedure. Patient was closely monitored under general anesthesia during the entirety of examination.  Indications:     removal of impella (LVAD) Performing Phys: [id number] Ronald VAN TRIGT Diagnosing Phys: Earl Lites Stoltzfus  Complications: No known complications during this procedure. POST-OP IMPRESSIONS - Left Ventricle: The left ventricle is unchanged from pre-bypass. - Aorta: The aorta appears unchanged from pre-bypass. - Left Atrial Appendage: The left atrial appendage appears unchanged from pre-bypass. - Aortic Valve: The aortic valve appears unchanged from pre-bypass. - Mitral Valve: The mitral valve appears unchanged from pre-bypass. - Tricuspid Valve: The tricuspid valve appears unchanged from pre-bypass. - Interatrial Septum: The interatrial septum appears unchanged from pre-bypass. - Pericardium: The pericardium appears unchanged from pre-bypass. Impella 5.5 appropriately positioned ~4.5cm into LV. Removal under TEE guidance without complication. No new or worsening valvular or wall motion abnormalities. Patient remains with severe LV dilation with global hypokinesis. Mild secondary MR present unchanged from prior scan.  PRE-OP FINDINGS Left Ventricle: The left ventricle has severely reduced systolic function, with an ejection fraction of 25-30%. The cavity size was severely dilated. There is mildly increased left ventricular wall thickness. Left ventricular diffuse hypokinesis.  Right Ventricle: The right ventricle has normal systolic function. The cavity was normal. There is no increase in right ventricular wall thickness.  Left Atrium: Left atrial size was normal  in size. The left atrial appendage is well visualized and there is no evidence of thrombus present. Left atrial appendage velocity is normal at greater than 40 cm/s.  Right Atrium: Right atrial size was normal in size.  Interatrial Septum: No atrial level shunt detected by color flow Doppler.  Pericardium: There is no evidence of pericardial effusion. There is no pleural effusion.  Mitral Valve: The mitral valve is normal in structure. Mild thickening of the mitral valve leaflet. Mild calcification of the mitral valve leaflet. Mitral valve regurgitation is mild by color flow Doppler. The MR jet is centrally-directed. There is no evidence of mitral valve vegetation. Pulmonary venous flow is normal. There is No evidence of mitral stenosis.  Tricuspid Valve: The tricuspid valve was normal in structure. Tricuspid valve regurgitation is mild by color flow Doppler. There is no evidence of tricuspid valve vegetation.  Aortic Valve: The aortic valve is tricuspid Aortic valve regurgitation was not visualized by color flow Doppler. There is no stenosis of the aortic valve. There is no evidence of a vegetation on the aortic valve.  Pulmonic Valve: The pulmonic valve was normal in structure. Pulmonic valve regurgitation is trivial by color flow Doppler.   Aorta: The aortic root and ascending aorta are normal in size and structure.  Pulmonary Artery: The pulmonary artery is of normal size.  Venous: The inferior vena cava is normal in size with greater than 50% respiratory variability, suggesting right atrial pressure of 3 mmHg.  Compared to previous exam:The left ventricular function is unchanged. The  left ventricular hypertrophy is unchanged. The left ventricular wall motion unchanged. +--------------+--------++ LEFT VENTRICLE         +--------------+--------++ PLAX 2D                +--------------+--------++ LVOT diam:    2.10 cm  +--------------+--------++ LVOT Area:    3.46  cm +--------------+--------++                        +--------------+--------++  +------------------+---------++ LV Volumes (MOD)            +------------------+---------++ LV area d, A2C:   28.00 cm +------------------+---------++ LV area d, A4C:   23.90 cm +------------------+---------++ LV area s, A2C:   23.00 cm +------------------+---------++ LV area s, A4C:   19.20 cm +------------------+---------++ LV vol d, MOD A2C:80.0 ml   +------------------+---------++ LV vol d, MOD A4C:65.8 ml   +------------------+---------++ LV vol s, MOD A2C:59.4 ml   +------------------+---------++ LV vol s, MOD A4C:47.0 ml   +------------------+---------++ LV SV MOD A2C:    20.6 ml   +------------------+---------++ LV SV MOD A4C:    65.8 ml   +------------------+---------++  +------------+-----------++ AORTIC VALVE            +------------+-----------++ LVOT Vmax:  68.00 cm/s  +------------+-----------++ LVOT Vmean: 46.000 cm/s +------------+-----------++ LVOT VTI:   0.088 m     +------------+-----------++  +--------------+-------++ AORTA                 +--------------+-------++ Ao Sinus diam:3.52 cm +--------------+-------++ Ao Asc diam:  3.10 cm +--------------+-------++  +--------------+----------++ MITRAL VALVE              +--------------+-------+ +--------------+----------++  SHUNTS                MV Area (PHT):5.66 cm    +--------------+-------+ +--------------+----------++  Systemic VTI: 0.09 m  MV Peak grad: 0.1 mmHg    +--------------+-------+ +--------------+----------++  Systemic Diam:2.10 cm MV Mean grad: 5.0 mmHg    +--------------+-------+ +--------------+----------++ MV Vmax:      0.15 m/s   +--------------+----------++ MV Vmean:     10.0 cm/s  +--------------+----------++ MV PHT:       38.86 msec +--------------+----------++ MV Decel  Time:134 msec   +--------------+----------++ +---------------+----------++ MR Peak grad:  0.6 mmHg   +---------------+----------++ MR Mean grad:  34.0 mmHg  +---------------+----------++ MR Vmax:       39.00 cm/s +---------------+----------++ MR Vmean:      27.0 cm/s  +---------------+----------++ MR PISA:       1.57 cm   +---------------+----------++ MR PISA Radius:0.50 cm    +---------------+----------++ +--------------+-----------++ MV E velocity:112.00 cm/s +--------------+-----------++ MV A velocity:39.00 cm/s  +--------------+-----------++ MV E/A ratio: 2.87        +--------------+-----------++   Hester Mates Electronically signed by Hester Mates Signature Date/Time: 11/15/2019/12:30:46 PM    Final             EKG:  EKG is not ordered today.    Recent Labs: 03/22/2022: ALT 21; BUN 20; Creatinine, Ser 1.00; Hemoglobin 14.1; Platelets 200; Potassium 5.0; Sodium 140; TSH 2.510  Recent Lipid Panel    Component Value Date/Time   CHOL 135 03/22/2022 0839   TRIG 120 03/22/2022 0839   HDL 55 03/22/2022 0839   CHOLHDL 2.5 03/22/2022 0839   CHOLHDL 2.3 01/07/2020 1601   VLDL 32 01/07/2020 1601   LDLCALC 59 03/22/2022 0839     Risk Assessment/Calculations:  Physical Exam:    VS:  There were no vitals taken for this visit.    Wt Readings from Last 3 Encounters:  06/08/22 183 lb (83 kg)  05/04/22 184 lb (83.5 kg)  03/22/22 179 lb (81.2 kg)     GEN:  Well nourished, well developed in no acute distress HEENT: Normal NECK: No JVD; No carotid bruits LYMPHATICS: No lymphadenopathy CARDIAC: RRR, no murmurs, rubs, gallops RESPIRATORY:  Clear to auscultation without rales, wheezing or rhonchi  ABDOMEN: Soft, non-tender, non-distended MUSCULOSKELETAL:  No edema; No deformity  SKIN: Warm and dry NEUROLOGIC:  Alert and oriented x 3 PSYCHIATRIC:  Normal affect   ASSESSMENT:    1. Coronary  artery disease involving native coronary artery of native heart without angina pectoris   2. PVC (premature ventricular contraction)   3. Chronic HFrEF (heart failure with reduced ejection fraction)   4. Presence of cardiac resynchronization therapy defibrillator (CRT-D)   5. Nonischemic cardiomyopathy   6. Peripheral arterial disease with history of revascularization   7. Bilateral carotid artery stenosis    PLAN:    In order of problems listed above:  CAD - NSTEMI 07/2015 s/p PCI to Lcx, s/p CABG x 4 10/2019. Stable with no anginal symptoms. No indication for ischemic evaluation.  Continue aspirin 81 mg daily, continue Coreg 12.5 mg daily, nitroglycerin as needed--has not needed. Heart healthy diet and regular cardiovascular exercise encouraged.    HFrEF - EF of 30 to 35%, global hypokinesis with segmental lateral dyssynchrony due to LBBB, mild LVH, grade 1 DD. Repeat echo recently arranged by EP NP. NYHA class I, euvolemic today.  Continue Coreg 12.5 mg daily, Entresto 49-51 mg daily. Spironolactone previously caused hyperkalemia, Jardiance was previously used however was cost prohibitive.   Presence of cardiac resynchronization device - recently evaluated by EP NP on 06/08/22. Device check on 06/08/22 showed bi-v pacing at 86%. On Mexlitine 150 mg daily.  Hyperlipidemia -most recent LDL was well-managed at 59. Intolerant of statins, failed Crestor 5 mg twice weekly, failed Crestor 20 mg daily, failed pravastatin 40 mg daily, Lipitor/Zocor/Zetia caused muscle aches.  Recently had been on Praluent, however cannot afford. Discussed fenofibrate, but this will likely not keep his LDL where we need it. Refer to PharmD for assistance.   PAD - denies claudication, managed by [facility].   Bilateral carotid artery stenosis - repeat carotid US in October this year, managed by [facility].     Disposition - refer to PharmD for lipid management. Return in 6 months.       Medication Adjustments/Labs and Tests  Ordered: Current medicines are reviewed at length with the patient today.  Concerns regarding medicines are outlined above.  No orders of the defined types were placed in this encounter.  No orders of the defined types were placed in this encounter.   There are no Patient Instructions on file for this visit.   Signed, Flossie Dibble, NP  06/09/2022 7:16 PM    [facility]

## 2022-06-10 ENCOUNTER — Encounter: Payer: Self-pay | Admitting: Physician Assistant

## 2022-06-10 ENCOUNTER — Ambulatory Visit: Payer: PPO | Attending: Physician Assistant | Admitting: Cardiology

## 2022-06-10 VITALS — BP 128/81 | HR 63 | Ht 66.0 in | Wt 181.0 lb

## 2022-06-10 DIAGNOSIS — I428 Other cardiomyopathies: Secondary | ICD-10-CM | POA: Diagnosis not present

## 2022-06-10 DIAGNOSIS — Z95810 Presence of automatic (implantable) cardiac defibrillator: Secondary | ICD-10-CM | POA: Diagnosis not present

## 2022-06-10 DIAGNOSIS — I493 Ventricular premature depolarization: Secondary | ICD-10-CM | POA: Diagnosis not present

## 2022-06-10 DIAGNOSIS — I739 Peripheral vascular disease, unspecified: Secondary | ICD-10-CM | POA: Diagnosis not present

## 2022-06-10 DIAGNOSIS — I6523 Occlusion and stenosis of bilateral carotid arteries: Secondary | ICD-10-CM

## 2022-06-10 DIAGNOSIS — I2510 Atherosclerotic heart disease of native coronary artery without angina pectoris: Secondary | ICD-10-CM | POA: Diagnosis not present

## 2022-06-10 DIAGNOSIS — Z9889 Other specified postprocedural states: Secondary | ICD-10-CM

## 2022-06-10 DIAGNOSIS — I5022 Chronic systolic (congestive) heart failure: Secondary | ICD-10-CM

## 2022-06-10 NOTE — Patient Instructions (Signed)
Referral placed for you to Lipid clinic.   Medication Instructions:  No changes at this time.   *If you need a refill on your cardiac medications before your next appointment, please call your pharmacy*   Lab Work: None  If you have labs (blood work) drawn today and your tests are completely normal, you will receive your results only by: MyChart Message (if you have MyChart) OR A paper copy in the mail If you have any lab test that is abnormal or we need to change your treatment, we will call you to review the results.   Testing/Procedures: None   Follow-Up: At [facility], you and your health needs are our priority.  As part of our continuing mission to provide you with exceptional heart care, we have created designated Provider Care Teams.  These Care Teams include your primary Cardiologist (physician) and Advanced Practice Providers (APPs -  Physician Assistants and Nurse Practitioners) who all work together to provide you with the care you need, when you need it.   Your next appointment:   6 month(s)  Provider:   Yvonne Kendall, MD or Eula Listen, PA-C

## 2022-06-21 ENCOUNTER — Ambulatory Visit (INDEPENDENT_AMBULATORY_CARE_PROVIDER_SITE_OTHER): Payer: PPO

## 2022-06-21 DIAGNOSIS — I428 Other cardiomyopathies: Secondary | ICD-10-CM | POA: Diagnosis not present

## 2022-06-22 LAB — CUP PACEART REMOTE DEVICE CHECK
Battery Remaining Longevity: 44 mo
Battery Remaining Percentage: 63 %
Battery Voltage: 2.96 V
Brady Statistic AP VP Percent: 82 %
Brady Statistic AP VS Percent: 7.5 %
Brady Statistic AS VP Percent: 4.2 %
Brady Statistic AS VS Percent: 1 %
Brady Statistic RA Percent Paced: 84 %
Date Time Interrogation Session: 20240423053249
HighPow Impedance: 63 Ohm
Implantable Lead Connection Status: 753985
Implantable Lead Connection Status: 753985
Implantable Lead Connection Status: 753985
Implantable Lead Implant Date: 20220124
Implantable Lead Implant Date: 20220124
Implantable Lead Implant Date: 20220124
Implantable Lead Location: 753858
Implantable Lead Location: 753859
Implantable Lead Location: 753860
Implantable Pulse Generator Implant Date: 20220124
Lead Channel Impedance Value: 490 Ohm
Lead Channel Impedance Value: 500 Ohm
Lead Channel Impedance Value: 560 Ohm
Lead Channel Pacing Threshold Amplitude: 0.625 V
Lead Channel Pacing Threshold Amplitude: 0.75 V
Lead Channel Pacing Threshold Amplitude: 1.625 V
Lead Channel Pacing Threshold Pulse Width: 0.5 ms
Lead Channel Pacing Threshold Pulse Width: 0.5 ms
Lead Channel Pacing Threshold Pulse Width: 0.5 ms
Lead Channel Sensing Intrinsic Amplitude: 12 mV
Lead Channel Sensing Intrinsic Amplitude: 2.3 mV
Lead Channel Setting Pacing Amplitude: 1.625
Lead Channel Setting Pacing Amplitude: 2 V
Lead Channel Setting Pacing Amplitude: 2.125
Lead Channel Setting Pacing Pulse Width: 0.5 ms
Lead Channel Setting Pacing Pulse Width: 0.5 ms
Lead Channel Setting Sensing Sensitivity: 0.5 mV
Pulse Gen Serial Number: 810017138
Zone Setting Status: 755011

## 2022-06-28 ENCOUNTER — Encounter: Payer: Self-pay | Admitting: Pharmacist

## 2022-06-28 NOTE — Progress Notes (Addendum)
Patient ID: [name]                 DOB: [birth year]                    MRN: [id number]    Total time spent for this visit: 30 min   Provider's location: [address], [place], [place] [address]  Patient's location: home [address]  [place] [address]    HPI: [name] is a 72 y.o. male patient referred to lipid clinic by Festus Barren . PMH is significant for  CAD with NSTEMI 07/2015 s/p PCI to Lcx, s/p CABG x 4 10/2019, NICM, chronic HFrEF, PVCs, s/p CRT-D 02/2020, hypertension, ectatic abdominal aorta, PVD, carotid stenosis s/p R CEA, LBBB, HLD, myalgia due to statin use. Today patient was successfully able to join the video visit. After about 15 min the video got disconnected so rest of the convsersation was held over the phone. Patient has been taking Praluent and tolerating it well with out any issue. However it is [employer] medication and it was cost prohibitive for him. Reports both his parents had cholesterol problem and heart issue. He exercise regularly and eats healthy diet.    Current Medications: Praluent 150 mg SQ every 14 days  Intolerances: Crestor 5 mg twice weekly, Crestor 20 mg daily, pravastatin 40 mg daily, Lipitor/Zocor/Zetia caused muscle aches  Risk Factors: CAD with NSTEMI 07/2015 s/p PCI to Lcx, s/p CABG  LDL goal: <70 mg/dl  Diet: red meat once month, eats lots of greens, backed or grilled chicken   Exercise: golf- 3-4 times week  Due to hip pain can't do walk so uses golf cart  M,W,F exercise bike 30 min, weights -15 min   Family History:  Mom: heart disease, HDL, HTN Father: HTN, HDL  Social History:  Alcohol: 7-10 drinks per weeks  Smoking: quit 23 years ago Smokeless tobacco: occasional - use varies  Recreational drug use: never  Labs: Lipid Panel     Component Value Date/Time   CHOL 135 03/22/2022 0839   TRIG 120 03/22/2022 0839   HDL 55 03/22/2022 0839   CHOLHDL 2.5 03/22/2022 0839   CHOLHDL 2.3 01/07/2020 1601   VLDL 32  01/07/2020 1601   LDLCALC 59 03/22/2022 0839   LABVLDL 21 03/22/2022 0839    Past Medical History:  Diagnosis Date   Abnormal nuclear cardiac imaging test 08/08/2015   AICD (automatic cardioverter/defibrillator) present    pacemaker/defib [employer]   Arthritis    fingers   Carotid artery occlusion    CHF (congestive heart failure) (HCC)    Coronary atherosclerosis of native coronary artery 01/29/2013   11/05/19 R/LHC 80% dLMCA stenosis small diffusely dz dLAD, chronically occluded OM1 50%mRCA lesion, widely patent mLCx strent, moderately elevated L heart filling pressures, mild to moderate RH filling pressures, normal to moderately reduced CO   Duodenal erosion    Encounter for screening for lung cancer 07/13/2016   Esophageal stenosis    esophageal dilation   GERD (gastroesophageal reflux disease)    H. 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 Cardiac catheterization-nonobstructive 60% RCA disease-subsequent nuclear stress test-9 minutes, low risk, mild inferior wall hypokinesis    Pain in limb 12/19/2017  Peripheral vascular disease (HCC)    Unstable angina (HCC) 11/25/2017    Current Outpatient Medications on File Prior to Visit  Medication Sig Dispense Refill   aspirin EC 81 MG tablet Take 1 tablet (81 mg total) by mouth daily. 150 tablet 2   carvedilol (COREG) 12.5 MG tablet TAKE (1) TABLET BY MOUTH TWICE DAILY 180 tablet 0   colchicine 0.6 MG tablet TAKE 1 TABLET BY MOUTH 2 TIMES DAILY. (Patient taking differently: Take 0.6 mg by mouth 2 (two) times daily as needed (gout attacks).) 30 tablet 0   methocarbamol (ROBAXIN) 500 MG tablet Take 500 mg by mouth. 1 po q occasionally as needed for pain/spasm. No more than one every few days.     mexiletine (MEXITIL) 150 MG capsule Take 1  capsule (150 mg total) by mouth 2 (two) times daily. 180 capsule 3   nitroGLYCERIN (NITROSTAT) 0.4 MG SL tablet Place 1 tablet (0.4 mg total) under the tongue every 5 (five) minutes as needed for chest pain. 25 tablet 2   oxyCODONE-acetaminophen (PERCOCET) 5-325 MG tablet Take 1 tablet by mouth every 8 (eight) hours as needed. 20 tablet 0   sacubitril-valsartan (ENTRESTO) 49-51 MG Take 1 tablet by mouth 2 (two) times daily. 180 tablet 3   traZODone (DESYREL) 50 MG tablet Take 1 tablet (50 mg total) by mouth at bedtime as needed. for sleep 90 tablet 1   No current facility-administered medications on file prior to visit.    Allergies  Allergen Reactions   Brilinta [Ticagrelor] Shortness Of Breath   Chlorhexidine Gluconate Other (See Comments)    Skin burning for hours afterward   Contrast Media [Iodinated Contrast Media] Itching    Face and head flushing, nose itching after contrast administation for angiogram   Statins Other (See Comments)    Failed Crestor 5 mg twice weekly, Crestor 20 mg daily, Pravastatin 40 mg qd, Lipitor, Zocor - muscle aches   Zetia [Ezetimibe] Other (See Comments)    Muscle aches    Assessment/Plan:  1. Hyperlipidemia -  Problem  Mixed Hyperlipidemia   Intolerant to daily Crestor 20 mg qd, Pravastatin 40 mg qd, Lipitor, Zocor, Zetia (muscle aches)    Mixed hyperlipidemia Assessment:  LDL goal: < 70 mg/dl last LDLc 59 mg/dl (40/98/1191) while on Praluent  Tolerates Praluent  well without any side effects but it is cost prohibitive  Intolerance to statins    Plan: Continue taking current medications Praluent 150 mg Q14D Enrolled in [employer] - EXP 05/29/2023 Called [facility] to conform $0 co-pay (Spoke to [place])    Thank you,  Carmela Hurt, Pharm.D [facility] [address], [place], [place] [address]  Phone: [phone]; Fax: [phone]

## 2022-06-29 ENCOUNTER — Telehealth: Payer: Self-pay | Admitting: Pharmacist

## 2022-06-29 ENCOUNTER — Ambulatory Visit: Payer: PPO | Attending: Cardiovascular Disease | Admitting: Student

## 2022-06-29 DIAGNOSIS — E782 Mixed hyperlipidemia: Secondary | ICD-10-CM

## 2022-06-29 MED ORDER — PRALUENT 150 MG/ML ~~LOC~~ SOAJ: 150.0000 mg | SUBCUTANEOUS | 2 refills | Status: DC

## 2022-06-29 NOTE — Assessment & Plan Note (Signed)
Assessment:  LDL goal: < 70 mg/dl last LDLc 59 mg/dl (40/98/1191) while on Praluent  Tolerates Praluent  well without any side effects but it is cost prohibitive  Intolerance to statins    Plan: Continue taking current medications Praluent 150 mg Q14D Enrolled in [employer] - EXP 05/29/2023 Called [facility] to conform $0 co-pay (Spoke to [place])

## 2022-06-29 NOTE — Telephone Encounter (Signed)
Enrolled in [facility] for hypercholesterolemia   [employer] ID: [id number]   CARD NO. [id number]   CARD STATUS Active   BIN [id number]   PCN [id number]   PC GROUP [id number]

## 2022-06-29 NOTE — Patient Instructions (Signed)
Enrolled in [facility]   [employer] ID: [id number]    CARD NO. [id number]   CARD STATUS Active   BIN [id number]   PCN [id number]   PC GROUP [id number]

## 2022-07-20 NOTE — Progress Notes (Signed)
Remote ICD transmission.   

## 2022-08-01 ENCOUNTER — Telehealth: Payer: Self-pay | Admitting: Internal Medicine

## 2022-08-01 ENCOUNTER — Other Ambulatory Visit: Payer: Self-pay

## 2022-08-01 DIAGNOSIS — G8929 Other chronic pain: Secondary | ICD-10-CM

## 2022-08-01 NOTE — Telephone Encounter (Signed)
Referral placed. [name]  

## 2022-08-01 NOTE — Telephone Encounter (Signed)
Copied from CRM [id number]. Topic: Referral - Request for Referral >> Aug 01, 2022  3:42 PM Patsy Lager T wrote: Has patient seen PCP for this complaint? Yes.   *If NO, is insurance requiring patient see PCP for this issue before PCP can refer them? Referral for which specialty: Pain Management Preferred provider/office: Levi Aland at [facility] [phone] Reason for referral: pain in left hip

## 2022-08-12 DIAGNOSIS — M545 Low back pain, unspecified: Secondary | ICD-10-CM | POA: Diagnosis not present

## 2022-08-12 DIAGNOSIS — M25552 Pain in left hip: Secondary | ICD-10-CM | POA: Diagnosis not present

## 2022-08-12 DIAGNOSIS — M5416 Radiculopathy, lumbar region: Secondary | ICD-10-CM | POA: Diagnosis not present

## 2022-08-12 DIAGNOSIS — M5136 Other intervertebral disc degeneration, lumbar region: Secondary | ICD-10-CM | POA: Diagnosis not present

## 2022-08-15 ENCOUNTER — Telehealth: Payer: Self-pay | Admitting: Internal Medicine

## 2022-08-15 MED ORDER — CARVEDILOL 12.5 MG PO TABS: ORAL_TABLET | ORAL | 0 refills | Status: DC

## 2022-08-15 NOTE — Telephone Encounter (Signed)
*  STAT* If patient is at the pharmacy, call can be transferred to refill team.   1. Which medications need to be refilled? (please list name of each medication and dose if known)   carvedilol (COREG) 12.5 MG tablet    2. Which pharmacy/location (including street and city if local pharmacy) is medication to be sent to?  [id number] - [place], [place] - [address]    3. Do they need a 30 day or 90 day supply? 90   Patient only has 1 tablet left.

## 2022-08-15 NOTE — Telephone Encounter (Signed)
Requested Prescriptions   Signed Prescriptions Disp Refills   carvedilol (COREG) 12.5 MG tablet 180 tablet 0    Sig: TAKE (1) TABLET BY MOUTH TWICE DAILY    Authorizing Provider: END, CHRISTOPHER    Ordering User: NEWCOMER MCCLAIN, Yaira Bernardi L

## 2022-09-05 DIAGNOSIS — M545 Low back pain, unspecified: Secondary | ICD-10-CM | POA: Diagnosis not present

## 2022-09-05 DIAGNOSIS — M9903 Segmental and somatic dysfunction of lumbar region: Secondary | ICD-10-CM | POA: Diagnosis not present

## 2022-09-20 ENCOUNTER — Ambulatory Visit (INDEPENDENT_AMBULATORY_CARE_PROVIDER_SITE_OTHER): Payer: PPO

## 2022-09-20 ENCOUNTER — Other Ambulatory Visit: Payer: Self-pay | Admitting: Internal Medicine

## 2022-09-20 DIAGNOSIS — I428 Other cardiomyopathies: Secondary | ICD-10-CM

## 2022-09-22 LAB — CUP PACEART REMOTE DEVICE CHECK
Battery Remaining Percentage: 59 %
Battery Voltage: 2.95 V
Brady Statistic AP VP Percent: 82 %
Brady Statistic AP VS Percent: 7 %
Brady Statistic AS VP Percent: 4.1 %
Brady Statistic AS VS Percent: 1 %
Brady Statistic RA Percent Paced: 83 %
Date Time Interrogation Session: 20240723055353
HighPow Impedance: 59 Ohm
Implantable Lead Connection Status: 753985
Implantable Lead Connection Status: 753985
Implantable Lead Implant Date: 20220124
Implantable Lead Implant Date: 20220124
Implantable Lead Implant Date: 20220124
Implantable Lead Location: 753858
Implantable Lead Location: 753859
Implantable Lead Location: 753860
Implantable Pulse Generator Implant Date: 20220124
Lead Channel Impedance Value: 460 Ohm
Lead Channel Impedance Value: 460 Ohm
Lead Channel Impedance Value: 510 Ohm
Lead Channel Pacing Threshold Amplitude: 0.75 V
Lead Channel Pacing Threshold Amplitude: 2 V
Lead Channel Pacing Threshold Pulse Width: 0.5 ms
Lead Channel Sensing Intrinsic Amplitude: 12 mV
Lead Channel Sensing Intrinsic Amplitude: 2.1 mV
Lead Channel Setting Pacing Amplitude: 2 V
Lead Channel Setting Pacing Amplitude: 2.5 V
Lead Channel Setting Pacing Pulse Width: 0.5 ms
Lead Channel Setting Sensing Sensitivity: 0.5 mV
Pulse Gen Serial Number: 810017138
Zone Setting Status: 755011

## 2022-10-05 NOTE — Progress Notes (Signed)
Remote ICD transmission.   

## 2022-10-10 DIAGNOSIS — R2689 Other abnormalities of gait and mobility: Secondary | ICD-10-CM | POA: Diagnosis not present

## 2022-10-10 DIAGNOSIS — M25652 Stiffness of left hip, not elsewhere classified: Secondary | ICD-10-CM | POA: Diagnosis not present

## 2022-10-10 DIAGNOSIS — M25552 Pain in left hip: Secondary | ICD-10-CM | POA: Diagnosis not present

## 2022-10-18 DIAGNOSIS — R2689 Other abnormalities of gait and mobility: Secondary | ICD-10-CM | POA: Diagnosis not present

## 2022-10-18 DIAGNOSIS — M25652 Stiffness of left hip, not elsewhere classified: Secondary | ICD-10-CM | POA: Diagnosis not present

## 2022-10-18 DIAGNOSIS — M25552 Pain in left hip: Secondary | ICD-10-CM | POA: Diagnosis not present

## 2022-10-19 ENCOUNTER — Telehealth: Payer: Self-pay | Admitting: Internal Medicine

## 2022-10-19 NOTE — Telephone Encounter (Signed)
Copied from CRM [id number]. Topic: Medicare AWV >> Oct 19, 2022  2:43 PM Payton Doughty wrote: Reason for CRM: LM 10/19/2022 to schedule AWV   Verlee Rossetti; Care Guide Ambulatory Clinical Support  l [facility] Direct Dial: [phone]

## 2022-10-20 DIAGNOSIS — M25552 Pain in left hip: Secondary | ICD-10-CM | POA: Diagnosis not present

## 2022-10-20 DIAGNOSIS — M25652 Stiffness of left hip, not elsewhere classified: Secondary | ICD-10-CM | POA: Diagnosis not present

## 2022-10-20 DIAGNOSIS — R2689 Other abnormalities of gait and mobility: Secondary | ICD-10-CM | POA: Diagnosis not present

## 2022-10-25 DIAGNOSIS — M25552 Pain in left hip: Secondary | ICD-10-CM | POA: Diagnosis not present

## 2022-10-25 DIAGNOSIS — R2689 Other abnormalities of gait and mobility: Secondary | ICD-10-CM | POA: Diagnosis not present

## 2022-10-25 DIAGNOSIS — M25652 Stiffness of left hip, not elsewhere classified: Secondary | ICD-10-CM | POA: Diagnosis not present

## 2022-10-27 ENCOUNTER — Other Ambulatory Visit: Payer: Self-pay | Admitting: Internal Medicine

## 2022-10-27 DIAGNOSIS — F5101 Primary insomnia: Secondary | ICD-10-CM

## 2022-10-27 DIAGNOSIS — L718 Other rosacea: Secondary | ICD-10-CM | POA: Diagnosis not present

## 2022-10-27 DIAGNOSIS — L578 Other skin changes due to chronic exposure to nonionizing radiation: Secondary | ICD-10-CM | POA: Diagnosis not present

## 2022-10-27 DIAGNOSIS — Z872 Personal history of diseases of the skin and subcutaneous tissue: Secondary | ICD-10-CM | POA: Diagnosis not present

## 2022-10-27 DIAGNOSIS — L570 Actinic keratosis: Secondary | ICD-10-CM | POA: Diagnosis not present

## 2022-10-27 MED ORDER — TRAZODONE HCL 50 MG PO TABS: 50.0000 mg | ORAL_TABLET | Freq: Every evening | ORAL | 1 refills | Status: DC | PRN

## 2022-11-01 DIAGNOSIS — M25652 Stiffness of left hip, not elsewhere classified: Secondary | ICD-10-CM | POA: Diagnosis not present

## 2022-11-01 DIAGNOSIS — M25552 Pain in left hip: Secondary | ICD-10-CM | POA: Diagnosis not present

## 2022-11-01 DIAGNOSIS — R2689 Other abnormalities of gait and mobility: Secondary | ICD-10-CM | POA: Diagnosis not present

## 2022-11-08 ENCOUNTER — Telehealth: Payer: Self-pay | Admitting: Internal Medicine

## 2022-11-08 NOTE — Telephone Encounter (Signed)
Copied from CRM [id number]. Topic: Medicare AWV >> Nov 08, 2022  9:31 AM Payton Doughty wrote: Reason for CRM: LM 11/08/2022 to schedule AWV   Verlee Rossetti; Care Guide Ambulatory Clinical Support [place] [facility] Direct Dial: [phone]

## 2022-11-11 ENCOUNTER — Other Ambulatory Visit: Payer: Self-pay | Admitting: Internal Medicine

## 2022-11-11 NOTE — Telephone Encounter (Signed)
Good Morning,  Could you please contact this patient to schedule a 6 month follow up visit? The patient was last seen on 06-10-22. Thank you so much.

## 2022-11-24 ENCOUNTER — Telehealth: Payer: Self-pay | Admitting: Internal Medicine

## 2022-11-24 NOTE — Telephone Encounter (Signed)
Copied from CRM [id number]. Topic: Medicare AWV >> Nov 24, 2022  1:44 PM Payton Doughty wrote: Reason for CRM: Called LM 11/24/2022 to schedule AWV   Verlee Rossetti; Care Guide Ambulatory Clinical Support [place] l [facility] Direct Dial: [phone]

## 2022-11-25 ENCOUNTER — Other Ambulatory Visit: Payer: Self-pay | Admitting: Internal Medicine

## 2022-11-29 DIAGNOSIS — M5116 Intervertebral disc disorders with radiculopathy, lumbar region: Secondary | ICD-10-CM | POA: Diagnosis not present

## 2022-11-29 DIAGNOSIS — M76892 Other specified enthesopathies of left lower limb, excluding foot: Secondary | ICD-10-CM | POA: Diagnosis not present

## 2022-11-30 ENCOUNTER — Ambulatory Visit (INDEPENDENT_AMBULATORY_CARE_PROVIDER_SITE_OTHER)
Admission: RE | Admit: 2022-11-30 | Discharge: 2022-11-30 | Disposition: A | Discharge status: Home / Self Care | Payer: PPO | Source: Ambulatory Visit | Attending: Vascular Surgery

## 2022-11-30 ENCOUNTER — Encounter: Payer: Self-pay | Admitting: Physician Assistant

## 2022-11-30 ENCOUNTER — Ambulatory Visit (HOSPITAL_COMMUNITY)
Admission: RE | Admit: 2022-11-30 | Discharge: 2022-11-30 | Disposition: A | Discharge status: Home / Self Care | Payer: PPO | Source: Ambulatory Visit | Attending: Vascular Surgery | Admitting: Vascular Surgery

## 2022-11-30 ENCOUNTER — Ambulatory Visit (INDEPENDENT_AMBULATORY_CARE_PROVIDER_SITE_OTHER): Payer: PPO | Admitting: Physician Assistant

## 2022-11-30 ENCOUNTER — Ambulatory Visit (INDEPENDENT_AMBULATORY_CARE_PROVIDER_SITE_OTHER)
Admission: RE | Admit: 2022-11-30 | Discharge: 2022-11-30 | Disposition: A | Discharge status: Home / Self Care | Payer: PPO | Source: Ambulatory Visit | Attending: Vascular Surgery | Admitting: Vascular Surgery

## 2022-11-30 ENCOUNTER — Other Ambulatory Visit (HOSPITAL_COMMUNITY): Payer: Self-pay | Admitting: Physician Assistant

## 2022-11-30 VITALS — BP 129/89 | Temp 98.1°F | Resp 18 | Ht 66.5 in | Wt 183.2 lb

## 2022-11-30 DIAGNOSIS — I6523 Occlusion and stenosis of bilateral carotid arteries: Secondary | ICD-10-CM | POA: Insufficient documentation

## 2022-11-30 DIAGNOSIS — I739 Peripheral vascular disease, unspecified: Secondary | ICD-10-CM

## 2022-11-30 LAB — VAS US ABI WITH/WO TBI
Left ABI: 1.1
Right ABI: 1.02

## 2022-11-30 NOTE — Progress Notes (Signed)
Office Note     CC:  follow up Requesting Provider:  [name], MD  HPI: [name] is a 72 y.o. ([birth year]) male who presents for surveillance follow up of carotid artery stenosis and PAD.  He has remote history of right CEA in 2007 for symptomatic stenosis with Amaurosis. Since his CEA he has been without any new TIA or Stroke like symptoms. He is also followed for PAD having undergone left popliteal and TPT endarterectomy with removal of thrombosed Viabahn stent and left SFA to TPT bypass with GSV in November of 2023 by Dr. Randie Heinz. This was for rest pain.  He since has been without lower extremity symptoms.   Today he presents with his wife. He reports his legs overall are going well. He does still have some numbness/ tingling in left great toe and forefoot. He denies any pain on ambulation or rest. He does not have any non healing wounds. He does have some left hip pain but he says this he feels is unrelated to his blood flow and he is having it evaluated by Ortho.  He remains active. He plays golf regularly.   He denies any visual changes, slurred speech, facial drooping, unilateral upper or lower extremity weakness or numbness.He is medically managed on Aspirin. He has statin in tolerance.  Past Medical History:  Diagnosis Date   Abnormal nuclear cardiac imaging test 08/08/2015   AICD (automatic cardioverter/defibrillator) present    pacemaker/defib [employer]   Arthritis    fingers   Carotid artery occlusion    CHF (congestive heart failure) (HCC)    Coronary atherosclerosis of native coronary artery 01/29/2013   11/05/19 R/LHC 80% dLMCA stenosis small diffusely dz dLAD, chronically occluded OM1 50%mRCA lesion, widely patent mLCx strent, moderately elevated L heart filling pressures, mild to moderate RH filling pressures, normal to moderately reduced CO   Duodenal erosion    Encounter for screening for lung cancer 07/13/2016   Esophageal stenosis    esophageal dilation    GERD (gastroesophageal reflux disease)    H. 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 Cardiac catheterization-nonobstructive 60% RCA disease-subsequent nuclear stress test-9 minutes, low risk, mild inferior wall hypokinesis    Pain in limb 12/19/2017   Peripheral vascular disease (HCC)    Unstable angina (HCC) 11/25/2017    Past Surgical History:  Procedure Laterality Date   ABDOMINAL AORTOGRAM W/LOWER EXTREMITY Left 01/24/2022   Procedure: ABDOMINAL AORTOGRAM W/LOWER EXTREMITY;  Surgeon: Maeola Harman, MD;  Location: [facility] INVASIVE CV LAB;  Service: Cardiovascular;  Laterality: Left;   APPENDECTOMY     BACK SURGERY     BIV ICD INSERTION CRT-D N/A 03/23/2020   Procedure: BIV ICD INSERTION CRT-D;  Surgeon: Lanier Prude, MD;  Location: [facility] INVASIVE CV LAB;  Service: Cardiovascular;  Laterality: N/A;   CARDIAC CATHETERIZATION N/A 08/07/2015   Procedure: Left Heart Cath and Coronary Angiography;  Surgeon: Jake Bathe, MD;  Location: [facility] INVASIVE CV LAB;  Service: Cardiovascular;  Laterality: N/A;   CARDIAC CATHETERIZATION N/A 08/07/2015   Procedure: Coronary Stent Intervention;  Surgeon: Jake Bathe, MD;  Location: [facility] INVASIVE CV LAB;  Service: Cardiovascular;  Laterality: N/A;   CARDIAC CATHETERIZATION N/A 08/07/2015   Procedure: Coronary Stent Intervention;  Surgeon: Theron Arista  M [place], MD;  Location: [facility] INVASIVE CV LAB;  Service: Cardiovascular;  Laterality: N/A;   CAROTID ENDARTERECTOMY  01/05/2006   Right  CEA with DPA   CATARACT EXTRACTION W/ INTRAOCULAR LENS IMPLANT Left 12/04/2017   CATARACT EXTRACTION W/PHACO Left 12/04/2017   Procedure: CATARACT EXTRACTION PHACO AND INTRAOCULAR LENS PLACEMENT (IOC) LEFT;  Surgeon: Nevada Crane, MD;  Location: [facility];  Service:  Ophthalmology;  Laterality: Left;   CATARACT EXTRACTION W/PHACO Right 02/06/2018   Procedure: CATARACT EXTRACTION PHACO AND INTRAOCULAR LENS PLACEMENT (IOC)RIGHT;  Surgeon: Nevada Crane, MD;  Location: [facility];  Service: Ophthalmology;  Laterality: Right;   COLONOSCOPY  05/20/2008   COLONOSCOPY WITH PROPOFOL N/A 09/13/2018   Procedure: COLONOSCOPY WITH BIOPSY;  Surgeon: Midge Minium, MD;  Location: [facility];  Service: Endoscopy;  Laterality: N/A;   CORONARY ARTERY BYPASS GRAFT N/A 11/11/2019   Procedure: CORONARY ARTERY BYPASS GRAFTING (CABG) USING LIMA to Diag1; ENDOSCOPICALLY HARVESTED RIGHT GREATER SAPHENOUS VEIN: SVG to OM1; SVG to OM2; SVG to PDA.;  Surgeon: Kerin Perna, MD;  Location: [facility] OR;  Service: Open Heart Surgery;  Laterality: N/A;   CORONARY STENT PLACEMENT  08/07/2015   MID CIRCUMFLEX   ENDARTERECTOMY FEMORAL Bilateral 09/30/2020   Procedure: ENDARTERECTOMY FEMORAL;  Surgeon: Annice Needy, MD;  Location: [facility] ORS;  Service: Vascular;  Laterality: Bilateral;   ENDARTERECTOMY POPLITEAL Left 01/25/2022   Procedure: ENDARTERECTOMY POPLITEAL;  Surgeon: Maeola Harman, MD;  Location: [facility] OR;  Service: Vascular;  Laterality: Left;   ENDOVEIN HARVEST OF GREATER SAPHENOUS VEIN Right 11/11/2019   Procedure: ENDOVEIN HARVEST OF GREATER SAPHENOUS VEIN;  Surgeon: Kerin Perna, MD;  Location: [facility] OR;  Service: Open Heart Surgery;  Laterality: Right;   ESOPHAGOGASTRODUODENOSCOPY (EGD) WITH PROPOFOL N/A 02/11/2019   Procedure: ESOPHAGOGASTRODUODENOSCOPY (EGD) WITH BIOPSY and  Dilation;  Surgeon: Midge Minium, MD;  Location: [facility];  Service: Endoscopy;  Laterality: N/A;   ESOPHAGOGASTRODUODENOSCOPY (EGD) WITH PROPOFOL N/A 12/23/2021   Procedure: ESOPHAGOGASTRODUODENOSCOPY (EGD) WITH PROPOFOL;  Surgeon: Midge Minium, MD;  Location: [facility];  Service: Endoscopy;  Laterality: N/A;   FEMORAL-FEMORAL BYPASS GRAFT Left 01/25/2022    Procedure: left Femoral to below the knee Popliteal bypass.;  Surgeon: Maeola Harman, MD;  Location: [facility] OR;  Service: Vascular;  Laterality: Left;   HIP SURGERY Left 10/2016   left hip tendon repair   LEFT HEART CATH AND CORONARY ANGIOGRAPHY N/A 11/27/2017   Procedure: LEFT HEART CATH AND CORONARY ANGIOGRAPHY;  Surgeon: Iran Ouch, MD;  Location: [facility] INVASIVE CV LAB;  Service: Cardiovascular;  Laterality: N/A;   LOWER EXTREMITY ANGIOGRAPHY Left 02/12/2018   Procedure: LOWER EXTREMITY ANGIOGRAPHY;  Surgeon: Annice Needy, MD;  Location: [facility] INVASIVE CV LAB;  Service: Cardiovascular;  Laterality: Left;   LOWER EXTREMITY ANGIOGRAPHY Left 03/07/2018   Procedure: LOWER EXTREMITY ANGIOGRAPHY;  Surgeon: Annice Needy, MD;  Location: [facility] INVASIVE CV LAB;  Service: Cardiovascular;  Laterality: Left;   LOWER EXTREMITY ANGIOGRAPHY Left 06/04/2018   Procedure: LOWER EXTREMITY ANGIOGRAPHY;  Surgeon: Annice Needy, MD;  Location: [facility] INVASIVE CV LAB;  Service: Cardiovascular;  Laterality: Left;   LOWER EXTREMITY ANGIOGRAPHY Left 09/17/2020   Procedure: LOWER EXTREMITY ANGIOGRAPHY;  Surgeon: Annice Needy, MD;  Location: [facility] INVASIVE CV LAB;  Service: Cardiovascular;  Laterality: Left;   LOWER EXTREMITY ANGIOGRAPHY Left 09/28/2020   Procedure: LOWER EXTREMITY ANGIOGRAPHY;  Surgeon: Annice Needy, MD;  Location: [facility] INVASIVE CV LAB;  Service: Cardiovascular;  Laterality: Left;   LOWER EXTREMITY ANGIOGRAPHY N/A 02/15/2021   Procedure: LOWER EXTREMITY ANGIOGRAPHY;  Surgeon: Annice Needy, MD;  Location: [facility] INVASIVE CV LAB;  Service: Cardiovascular;  Laterality: N/A;   LOWER EXTREMITY ANGIOGRAPHY Left 12/27/2021   Procedure: Lower Extremity Angiography;  Surgeon: Annice Needy, MD;  Location: [facility] INVASIVE CV LAB;  Service: Cardiovascular;  Laterality: Left;   LOWER EXTREMITY ANGIOGRAPHY Left 12/28/2021   Procedure: Lower Extremity Angiography;  Surgeon: Annice Needy, MD;  Location: [facility] INVASIVE CV  LAB;  Service: Cardiovascular;  Laterality: Left;   PLACEMENT OF IMPELLA LEFT VENTRICULAR ASSIST DEVICE N/A 11/11/2019   Procedure: PLACEMENT OF IMPELLA LEFT VENTRICULAR ASSIST DEVICE 5.5;  Surgeon: Kerin Perna, MD;  Location: [facility] OR;  Service: Open Heart Surgery;  Laterality: N/A;  Midline Sternotomy   POLYPECTOMY N/A 09/13/2018   Procedure: POLYPECTOMY;  Surgeon: Midge Minium, MD;  Location: [facility];  Service: Endoscopy;  Laterality: N/A;   POLYPECTOMY N/A 02/11/2019   Procedure: POLYPECTOMY;  Surgeon: Midge Minium, MD;  Location: [facility];  Service: Endoscopy;  Laterality: N/A;   REMOVAL OF IMPELLA LEFT VENTRICULAR ASSIST DEVICE N/A 11/15/2019   Procedure: REMOVAL OF IMPELLA 5.5 LEFT VENTRICULAR ASSIST DEVICE;  Surgeon: Kerin Perna, MD;  Location: [facility] OR;  Service: Open Heart Surgery;  Laterality: N/A;   RIGHT/LEFT HEART CATH AND CORONARY ANGIOGRAPHY N/A 11/05/2019   Procedure: RIGHT/LEFT HEART CATH AND CORONARY ANGIOGRAPHY;  Surgeon: Yvonne Kendall, MD;  Location: [facility] INVASIVE CV LAB;  Service: Cardiovascular;  Laterality: N/A;   SPINE SURGERY     TEE WITHOUT CARDIOVERSION N/A 11/11/2019   Procedure: TRANSESOPHAGEAL ECHOCARDIOGRAM (TEE);  Surgeon: Donata Clay, Theron Arista, MD;  Location: [facility] OR;  Service: Open Heart Surgery;  Laterality: N/A;   TEE WITHOUT CARDIOVERSION N/A 11/15/2019   Procedure: TRANSESOPHAGEAL ECHOCARDIOGRAM (TEE);  Surgeon: Donata Clay, Theron Arista, MD;  Location: [facility] OR;  Service: Open Heart Surgery;  Laterality: N/A;   TONSILLECTOMY      Social History   Socioeconomic History   Marital status: Married    Spouse name: Not on file   Number of children: 1   Years of education: Not on file   Highest education level: Bachelor's degree (e.g., BA, AB, BS)  Occupational History   Not on file  Tobacco Use   Smoking status: Former    Current packs/day: 0.00    Average packs/day: 1.3 packs/day for 35.0 years (43.8 ttl pk-yrs)    Types: Cigarettes    Start  date: 02/28/1970    Quit date: 02/28/2005    Years since quitting: 17.7    Passive exposure: Never   Smokeless tobacco: Current    Types: Snuff   Tobacco comments:    occaisionally  Vaping Use   Vaping status: Never Used  Substance and Sexual Activity   Alcohol use: Yes    Alcohol/week: 10.0 standard drinks of alcohol    Types: 10 Standard drinks or equivalent per week    Comment: wine/liquor weekly   Drug use: No   Sexual activity: Yes  Other Topics Concern   Not on file  Social History Narrative   Not on file   Social Determinants of Health   Financial Resource Strain: Low Risk  (03/22/2022)   Overall Financial Resource Strain (CARDIA)    Difficulty of Paying Living Expenses: Not hard at all  Food Insecurity: No Food Insecurity (03/22/2022)   Hunger Vital Sign    Worried About Running Out of Food in the Last  Year: Never true    Ran Out of Food in the Last Year: Never true  Transportation Needs: No Transportation Needs (03/22/2022)   PRAPARE - Administrator, Civil Service (Medical): No    Lack of Transportation (Non-Medical): No  Physical Activity: Insufficiently Active (09/23/2020)   Exercise Vital Sign    Days of Exercise per Week: 2 days    Minutes of Exercise per Session: 60 min  Stress: No Stress Concern Present (09/23/2020)   [employer] of Occupational Health - Occupational Stress Questionnaire    Feeling of Stress : Not at all  Social Connections: Moderately Integrated (09/23/2020)   Social Connection and Isolation Panel [NHANES]    Frequency of Communication with Friends and Family: More than three times a week    Frequency of Social Gatherings with Friends and Family: Three times a week    Attends Religious Services: Never    Active Member of Clubs or Organizations: Yes    Attends Banker Meetings: More than 4 times per year    Marital Status: Married  Catering manager Violence: Not At Risk (03/22/2022)   Humiliation, Afraid, Rape, and  Kick questionnaire    Fear of Current or Ex-Partner: No    Emotionally Abused: No    Physically Abused: No    Sexually Abused: No    Family History  Problem Relation Age of Onset   Heart attack Mother    Coronary artery disease Mother    Heart disease Mother        Carotid Stenosis and BPG and Heart Disease before age 21   Diabetes Mother    Hypertension Mother    Heart attack Father    Heart disease Father        BPG and Heart Disease before age 56   Hypertension Father    Cancer Father 69       throat   Stroke Father    Colon cancer Neg Hx    Colon polyps Neg Hx    Esophageal cancer Neg Hx    Rectal cancer Neg Hx    Stomach cancer Neg Hx     Current Outpatient Medications  Medication Sig Dispense Refill   Alirocumab (PRALUENT) 150 MG/ML SOAJ INJECT 1 ML (150 MG TOTAL) INTO THE SKIN EVERY 14 (FOURTEEN) DAYS. 2 mL 2   aspirin EC 81 MG tablet Take 1 tablet (81 mg total) by mouth daily. 150 tablet 2   carvedilol (COREG) 12.5 MG tablet TAKE (1) TABLET BY MOUTH TWICE DAILY. 60 tablet 0   colchicine 0.6 MG tablet TAKE 1 TABLET BY MOUTH 2 TIMES DAILY. (Patient taking differently: Take 0.6 mg by mouth 2 (two) times daily as needed (gout attacks).) 30 tablet 0   methocarbamol (ROBAXIN) 500 MG tablet Take 500 mg by mouth. 1 po q occasionally as needed for pain/spasm. No more than one every few days.     mexiletine (MEXITIL) 150 MG capsule Take 1 capsule (150 mg total) by mouth 2 (two) times daily. 180 capsule 3   nitroGLYCERIN (NITROSTAT) 0.4 MG SL tablet Place 1 tablet (0.4 mg total) under the tongue every 5 (five) minutes as needed for chest pain. 25 tablet 2   oxyCODONE-acetaminophen (PERCOCET) 5-325 MG tablet Take 1 tablet by mouth every 8 (eight) hours as needed. 20 tablet 0   sacubitril-valsartan (ENTRESTO) 49-51 MG Take 1 tablet by mouth 2 (two) times daily. 180 tablet 3   traZODone (DESYREL) 50 MG tablet Take 1 tablet (50  mg total) by mouth at bedtime as needed. for sleep 90  tablet 1   No current facility-administered medications for this visit.    Allergies  Allergen Reactions   Brilinta [Ticagrelor] Shortness Of Breath   Chlorhexidine Gluconate Other (See Comments)    Skin burning for hours afterward   Contrast Media [Iodinated Contrast Media] Itching    Face and head flushing, nose itching after contrast administation for angiogram   Statins Other (See Comments)    Failed Crestor 5 mg twice weekly, Crestor 20 mg daily, Pravastatin 40 mg qd, Lipitor, Zocor - muscle aches   Zetia [Ezetimibe] Other (See Comments)    Muscle aches     REVIEW OF SYSTEMS:   [X]  denotes positive finding, [ ]  denotes negative finding Cardiac  Comments:  Chest pain or chest pressure:    Shortness of breath upon exertion:    Short of breath when lying flat:    Irregular heart rhythm:        Vascular    Pain in calf, thigh, or hip brought on by ambulation:    Pain in feet at night that wakes you up from your sleep:     Blood clot in your veins:    Leg swelling:         Pulmonary    Oxygen at home:    Productive cough:     Wheezing:         Neurologic    Sudden weakness in arms or legs:     Sudden numbness in arms or legs:     Sudden onset of difficulty speaking or slurred speech:    Temporary loss of vision in one eye:     Problems with dizziness:         Gastrointestinal    Blood in stool:     Vomited blood:         Genitourinary    Burning when urinating:     Blood in urine:        Psychiatric    Major depression:         Hematologic    Bleeding problems:    Problems with blood clotting too easily:        Skin    Rashes or ulcers:        Constitutional    Fever or chills:      PHYSICAL EXAMINATION:  Vitals:   11/30/22 1028 11/30/22 1031  BP:  129/89  Resp: 18   Temp: 98.1 F (36.7 C)   TempSrc: Temporal   SpO2: 94%   Weight: 183 lb 3.2 oz (83.1 kg)   Height: 5' 6.5" (1.689 m)     General:  WDWN in NAD; vital signs documented  above Gait: Normal HENT: WNL, normocephalic Pulmonary: normal non-labored breathing Abdomen: soft, NT, no masses Vascular Exam/Pulses: 2+ femoral pulses, 2+ posterior tibial pulses, foot warm and well perfused Extremities: without ischemic changes, without Gangrene , without cellulitis; without open wounds;  Musculoskeletal: no muscle wasting or atrophy  Neurologic: A&O X 3 Psychiatric:  The pt has Normal affect.   Non-Invasive Vascular Imaging:   VAS Carotid Duplex: Summary:  Right Carotid: Velocities in the right ICA are consistent with a 1-39% stenosis.   Left Carotid: Velocities in the left ICA are consistent with a 40-59% stenosis, upper end of range. Could not replicate velocities from previous exam.   Vertebrals:  Bilateral vertebral arteries demonstrate antegrade flow.  Subclavians: Normal flow hemodynamics were seen in bilateral subclavian  arteries.   Summary:  Left: Patent left femoral-below knee popliteal artery bypass graft without evidence of hemodynamically significant stenosis. Low velocities in the graft could suggest a threatened bypass.   VAS [place] lower extremity bypass graft duplex: Summary:  Left: Patent left femoral-below knee popliteal artery bypass graft without evidence of hemodynamically significant stenosis. Low velocities in the graft could suggest a threatened bypass.   ABI Bilateral ABIs appear essentially unchanged. Right TBIs appear essentially unchanged. Left TBI is decreased.   ASSESSMENT/PLAN:: 72 y.o. male here for surveillance follow up of carotid artery stenosis and PAD.  He has remote history of right CEA in 2007 for symptomatic stenosis with Amaurosis. Since his CEA he has been without any new TIA or Stroke like symptoms. He has some residual left foot numbness but is without any claudication, rest pain or tissue loss.  - Carotid duplex today is overall unchanged. Left ICA actually has lower velocities than on prior examination. Left ICA in the  40-59% stenosis range. Right ICA 1-39%. Normal flow in the vertebral and subclavian arteries - Bypass graft duplex shows low velocities but this is unchanged from 6 months ago. Triphasic flow throughout bypass graft and no evidence of stenosis seen.  -continue Aspirin - Follow up in 1 year with repeat carotid duplex, ABI and    Graceann Congress, PA-C [facility] [phone]  Clinic MD:   Randie Heinz

## 2022-12-01 ENCOUNTER — Other Ambulatory Visit: Payer: Self-pay | Admitting: Family Medicine

## 2022-12-01 DIAGNOSIS — M51362 Other intervertebral disc degeneration, lumbar region with discogenic back pain and lower extremity pain: Secondary | ICD-10-CM | POA: Diagnosis not present

## 2022-12-01 DIAGNOSIS — M5416 Radiculopathy, lumbar region: Secondary | ICD-10-CM | POA: Diagnosis not present

## 2022-12-06 ENCOUNTER — Ambulatory Visit: Payer: PPO

## 2022-12-06 DIAGNOSIS — Z0000 Encounter for general adult medical examination without abnormal findings: Secondary | ICD-10-CM | POA: Diagnosis not present

## 2022-12-06 NOTE — Patient Instructions (Addendum)
[name] , Thank you for taking time to come for your Medicare Wellness Visit. I appreciate your ongoing commitment to your health goals. Please review the following plan we discussed and let me know if I can assist you in the future.   Referrals/Orders/Follow-Ups/Clinician Recommendations: none  This is a list of the screening recommended for you and due dates:  Health Maintenance  Topic Date Due   DTaP/Tdap/Td vaccine (2 - Td or Tdap) 04/18/2022   Flu Shot  09/29/2022   COVID-19 Vaccine (6 - 2023-24 season) 10/30/2022   Colon Cancer Screening  09/13/2023   Medicare Annual Wellness Visit  12/06/2023   Pneumonia Vaccine  Completed   Hepatitis C Screening  Completed   Zoster (Shingles) Vaccine  Completed   HPV Vaccine  Aged Out    Advanced directives: (ACP Link)Information on Advanced Care Planning can be found at [facility] [place] Advance Health Care Directives Advance Health Care Directives ([email or web address]   Next Medicare Annual Wellness Visit scheduled for next year: Yes   12/12/23 @ 8:15 am by video

## 2022-12-06 NOTE — Progress Notes (Signed)
Subjective:   [name] is a 72 y.o. male who presents for Medicare Annual/Subsequent preventive examination.  Visit Complete: Virtual I connected with  [name] on 12/06/22 by a audio enabled telemedicine application and verified that I am speaking with the correct person using two identifiers.  Patient Location: Home  Provider Location: Office/Clinic  I discussed the limitations of evaluation and management by telemedicine. The patient expressed understanding and agreed to proceed.  Vital Signs: Because this visit was a virtual/telehealth visit, some criteria may be missing or patient reported. Any vitals not documented were not able to be obtained and vitals that have been documented are patient reported.      Objective:    Today's Vitals   12/06/22 0817  PainSc: 0-No pain   There is no height or weight on file to calculate BMI.     12/06/2022    8:22 AM 01/27/2022   10:26 PM 01/24/2022    5:42 AM 12/28/2021    5:00 AM 12/27/2021    1:35 PM 12/23/2021    7:54 AM 03/04/2021    8:13 AM  Advanced Directives  Does Patient Have a Medical Advance Directive? No Yes Yes Yes Yes Yes No  Type of Special educational needs teacher of [employer] Power of [place];Living will Living will Living will Living will   Does patient want to make changes to medical advance directive?  No - Patient declined  Yes (Inpatient - patient defers changing a medical advance directive and declines information at this time)     Copy of Healthcare Power of Attorney in Chart?  No - copy requested No - copy requested      Would patient like information on creating a medical advance directive? No - Patient declined          Current Medications (verified) Outpatient Encounter Medications as of 12/06/2022  Medication Sig   Alirocumab (PRALUENT) 150 MG/ML SOAJ INJECT 1 ML (150 MG TOTAL) INTO THE SKIN EVERY 14 (FOURTEEN) DAYS.   aspirin EC 81 MG tablet Take 1 tablet (81 mg total) by mouth  daily.   carvedilol (COREG) 12.5 MG tablet TAKE (1) TABLET BY MOUTH TWICE DAILY.   colchicine 0.6 MG tablet TAKE 1 TABLET BY MOUTH 2 TIMES DAILY. (Patient taking differently: Take 0.6 mg by mouth 2 (two) times daily as needed (gout attacks).)   methocarbamol (ROBAXIN) 500 MG tablet Take 500 mg by mouth. 1 po q occasionally as needed for pain/spasm. No more than one every few days.   mexiletine (MEXITIL) 150 MG capsule Take 1 capsule (150 mg total) by mouth 2 (two) times daily.   nitroGLYCERIN (NITROSTAT) 0.4 MG SL tablet Place 1 tablet (0.4 mg total) under the tongue every 5 (five) minutes as needed for chest pain.   oxyCODONE-acetaminophen (PERCOCET) 5-325 MG tablet Take 1 tablet by mouth every 8 (eight) hours as needed.   traZODone (DESYREL) 50 MG tablet Take 1 tablet (50 mg total) by mouth at bedtime as needed. for sleep   [DISCONTINUED] sacubitril-valsartan (ENTRESTO) 49-51 MG Take 1 tablet by mouth 2 (two) times daily.   No facility-administered encounter medications on file as of 12/06/2022.    Allergies (verified) Brilinta [ticagrelor], Chlorhexidine gluconate, Contrast media [iodinated contrast media], Statins, and Zetia [ezetimibe]   History: Past Medical History:  Diagnosis Date   Abnormal nuclear cardiac imaging test 08/08/2015   AICD (automatic cardioverter/defibrillator) present    pacemaker/defib St [employer]   Arthritis    fingers  Carotid artery occlusion    CHF (congestive heart failure) (HCC)    Coronary atherosclerosis of native coronary artery 01/29/2013   11/05/19 R/LHC 80% dLMCA stenosis small diffusely dz dLAD, chronically occluded OM1 50%mRCA lesion, widely patent mLCx strent, moderately elevated L heart filling pressures, mild to moderate RH filling pressures, normal to moderately reduced CO   Duodenal erosion    Encounter for screening for lung cancer 07/13/2016   Esophageal stenosis    esophageal dilation   GERD (gastroesophageal reflux disease)    H.  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 Cardiac catheterization-nonobstructive 60% RCA disease-subsequent nuclear stress test-9 minutes, low risk, mild inferior wall hypokinesis    Pain in limb 12/19/2017   Peripheral vascular disease (HCC)    Unstable angina (HCC) 11/25/2017   Past Surgical History:  Procedure Laterality Date   ABDOMINAL AORTOGRAM W/LOWER EXTREMITY Left 01/24/2022   Procedure: ABDOMINAL AORTOGRAM W/LOWER EXTREMITY;  Surgeon: Maeola Harman, MD;  Location: [facility] INVASIVE CV LAB;  Service: Cardiovascular;  Laterality: Left;   APPENDECTOMY     BACK SURGERY     BIV ICD INSERTION CRT-D N/A 03/23/2020   Procedure: BIV ICD INSERTION CRT-D;  Surgeon: Lanier Prude, MD;  Location: [facility] INVASIVE CV LAB;  Service: Cardiovascular;  Laterality: N/A;   CARDIAC CATHETERIZATION N/A 08/07/2015   Procedure: Left Heart Cath and Coronary Angiography;  Surgeon: Jake Bathe, MD;  Location: [facility] INVASIVE CV LAB;  Service: Cardiovascular;  Laterality: N/A;   CARDIAC CATHETERIZATION N/A 08/07/2015   Procedure: Coronary Stent Intervention;  Surgeon: Jake Bathe, MD;  Location: [facility] INVASIVE CV LAB;  Service: Cardiovascular;  Laterality: N/A;   CARDIAC CATHETERIZATION N/A 08/07/2015   Procedure: Coronary Stent Intervention;  Surgeon: Peter M Swaziland, MD;  Location: [facility] INVASIVE CV LAB;  Service: Cardiovascular;  Laterality: N/A;   CAROTID ENDARTERECTOMY  01/05/2006   Right  CEA with DPA   CATARACT EXTRACTION W/ INTRAOCULAR LENS IMPLANT Left 12/04/2017   CATARACT EXTRACTION W/PHACO Left 12/04/2017   Procedure: CATARACT EXTRACTION PHACO AND INTRAOCULAR LENS PLACEMENT (IOC) LEFT;  Surgeon: Nevada Crane, MD;  Location: [facility] SURGERY CNTR;  Service: Ophthalmology;  Laterality: Left;   CATARACT EXTRACTION  W/PHACO Right 02/06/2018   Procedure: CATARACT EXTRACTION PHACO AND INTRAOCULAR LENS PLACEMENT (IOC)RIGHT;  Surgeon: Nevada Crane, MD;  Location: [facility] SURGERY CNTR;  Service: Ophthalmology;  Laterality: Right;   COLONOSCOPY  05/20/2008   COLONOSCOPY WITH PROPOFOL N/A 09/13/2018   Procedure: COLONOSCOPY WITH BIOPSY;  Surgeon: Midge Minium, MD;  Location: [facility] SURGERY CNTR;  Service: Endoscopy;  Laterality: N/A;   CORONARY ARTERY BYPASS GRAFT N/A 11/11/2019   Procedure: CORONARY ARTERY BYPASS GRAFTING (CABG) USING LIMA to Diag1; ENDOSCOPICALLY HARVESTED RIGHT GREATER SAPHENOUS VEIN: SVG to OM1; SVG to OM2; SVG to PDA.;  Surgeon: Kerin Perna, MD;  Location: [facility] OR;  Service: Open Heart Surgery;  Laterality: N/A;   CORONARY STENT PLACEMENT  08/07/2015   MID CIRCUMFLEX   ENDARTERECTOMY FEMORAL Bilateral 09/30/2020   Procedure: ENDARTERECTOMY FEMORAL;  Surgeon: Annice Needy, MD;  Location: [facility] ORS;  Service: Vascular;  Laterality: Bilateral;   ENDARTERECTOMY POPLITEAL Left 01/25/2022   Procedure: ENDARTERECTOMY POPLITEAL;  Surgeon: Maeola Harman, MD;  Location: [facility] OR;  Service: Vascular;  Laterality: Left;  ENDOVEIN HARVEST OF GREATER SAPHENOUS VEIN Right 11/11/2019   Procedure: ENDOVEIN HARVEST OF GREATER SAPHENOUS VEIN;  Surgeon: Kerin Perna, MD;  Location: [facility] OR;  Service: Open Heart Surgery;  Laterality: Right;   ESOPHAGOGASTRODUODENOSCOPY (EGD) WITH PROPOFOL N/A 02/11/2019   Procedure: ESOPHAGOGASTRODUODENOSCOPY (EGD) WITH BIOPSY and  Dilation;  Surgeon: Midge Minium, MD;  Location: [facility] SURGERY CNTR;  Service: Endoscopy;  Laterality: N/A;   ESOPHAGOGASTRODUODENOSCOPY (EGD) WITH PROPOFOL N/A 12/23/2021   Procedure: ESOPHAGOGASTRODUODENOSCOPY (EGD) WITH PROPOFOL;  Surgeon: Midge Minium, MD;  Location: [facility] ENDOSCOPY;  Service: Endoscopy;  Laterality: N/A;   FEMORAL-FEMORAL BYPASS GRAFT Left 01/25/2022   Procedure: left Femoral to below the knee Popliteal bypass.;   Surgeon: Maeola Harman, MD;  Location: [facility] OR;  Service: Vascular;  Laterality: Left;   HIP SURGERY Left 10/2016   left hip tendon repair   LEFT HEART CATH AND CORONARY ANGIOGRAPHY N/A 11/27/2017   Procedure: LEFT HEART CATH AND CORONARY ANGIOGRAPHY;  Surgeon: Iran Ouch, MD;  Location: [facility] INVASIVE CV LAB;  Service: Cardiovascular;  Laterality: N/A;   LOWER EXTREMITY ANGIOGRAPHY Left 02/12/2018   Procedure: LOWER EXTREMITY ANGIOGRAPHY;  Surgeon: Annice Needy, MD;  Location: [facility] INVASIVE CV LAB;  Service: Cardiovascular;  Laterality: Left;   LOWER EXTREMITY ANGIOGRAPHY Left 03/07/2018   Procedure: LOWER EXTREMITY ANGIOGRAPHY;  Surgeon: Annice Needy, MD;  Location: [facility] INVASIVE CV LAB;  Service: Cardiovascular;  Laterality: Left;   LOWER EXTREMITY ANGIOGRAPHY Left 06/04/2018   Procedure: LOWER EXTREMITY ANGIOGRAPHY;  Surgeon: Annice Needy, MD;  Location: [facility] INVASIVE CV LAB;  Service: Cardiovascular;  Laterality: Left;   LOWER EXTREMITY ANGIOGRAPHY Left 09/17/2020   Procedure: LOWER EXTREMITY ANGIOGRAPHY;  Surgeon: Annice Needy, MD;  Location: [facility] INVASIVE CV LAB;  Service: Cardiovascular;  Laterality: Left;   LOWER EXTREMITY ANGIOGRAPHY Left 09/28/2020   Procedure: LOWER EXTREMITY ANGIOGRAPHY;  Surgeon: Annice Needy, MD;  Location: [facility] INVASIVE CV LAB;  Service: Cardiovascular;  Laterality: Left;   LOWER EXTREMITY ANGIOGRAPHY N/A 02/15/2021   Procedure: LOWER EXTREMITY ANGIOGRAPHY;  Surgeon: Annice Needy, MD;  Location: [facility] INVASIVE CV LAB;  Service: Cardiovascular;  Laterality: N/A;   LOWER EXTREMITY ANGIOGRAPHY Left 12/27/2021   Procedure: Lower Extremity Angiography;  Surgeon: Annice Needy, MD;  Location: [facility] INVASIVE CV LAB;  Service: Cardiovascular;  Laterality: Left;   LOWER EXTREMITY ANGIOGRAPHY Left 12/28/2021   Procedure: Lower Extremity Angiography;  Surgeon: Annice Needy, MD;  Location: [facility] INVASIVE CV LAB;  Service: Cardiovascular;  Laterality: Left;   PLACEMENT OF  IMPELLA LEFT VENTRICULAR ASSIST DEVICE N/A 11/11/2019   Procedure: PLACEMENT OF IMPELLA LEFT VENTRICULAR ASSIST DEVICE 5.5;  Surgeon: Kerin Perna, MD;  Location: [facility] OR;  Service: Open Heart Surgery;  Laterality: N/A;  Midline Sternotomy   POLYPECTOMY N/A 09/13/2018   Procedure: POLYPECTOMY;  Surgeon: Midge Minium, MD;  Location: [facility] SURGERY CNTR;  Service: Endoscopy;  Laterality: N/A;   POLYPECTOMY N/A 02/11/2019   Procedure: POLYPECTOMY;  Surgeon: Midge Minium, MD;  Location: [facility] SURGERY CNTR;  Service: Endoscopy;  Laterality: N/A;   REMOVAL OF IMPELLA LEFT VENTRICULAR ASSIST DEVICE N/A 11/15/2019   Procedure: REMOVAL OF IMPELLA 5.5 LEFT VENTRICULAR ASSIST DEVICE;  Surgeon: Kerin Perna, MD;  Location: [facility] OR;  Service: Open Heart Surgery;  Laterality: N/A;   RIGHT/LEFT HEART CATH AND CORONARY ANGIOGRAPHY N/A 11/05/2019   Procedure: RIGHT/LEFT HEART CATH AND CORONARY ANGIOGRAPHY;  Surgeon: Yvonne Kendall, MD;  Location: [facility] INVASIVE CV LAB;  Service: Cardiovascular;  Laterality:  N/A;   SPINE SURGERY     TEE WITHOUT CARDIOVERSION N/A 11/11/2019   Procedure: TRANSESOPHAGEAL ECHOCARDIOGRAM (TEE);  Surgeon: Donata Clay, Theron Arista, MD;  Location: [facility] OR;  Service: Open Heart Surgery;  Laterality: N/A;   TEE WITHOUT CARDIOVERSION N/A 11/15/2019   Procedure: TRANSESOPHAGEAL ECHOCARDIOGRAM (TEE);  Surgeon: Donata Clay, Theron Arista, MD;  Location: [facility] OR;  Service: Open Heart Surgery;  Laterality: N/A;   TONSILLECTOMY     Family History  Problem Relation Age of Onset   Heart attack Mother    Coronary artery disease Mother    Heart disease Mother        Carotid Stenosis and BPG and Heart Disease before age 51   Diabetes Mother    Hypertension Mother    Heart attack Father    Heart disease Father        BPG and Heart Disease before age 73   Hypertension Father    Cancer Father 91       throat   Stroke Father    Colon cancer Neg Hx    Colon polyps Neg Hx    Esophageal cancer Neg Hx    Rectal cancer  Neg Hx    Stomach cancer Neg Hx    Social History   Socioeconomic History   Marital status: Married    Spouse name: Not on file   Number of children: 1   Years of education: Not on file   Highest education level: Bachelor's degree (e.g., BA, AB, BS)  Occupational History   Not on file  Tobacco Use   Smoking status: Former    Current packs/day: 0.00    Average packs/day: 1.3 packs/day for 35.0 years (43.8 ttl pk-yrs)    Types: Cigarettes    Start date: 02/28/1970    Quit date: 02/28/2005    Years since quitting: 17.7    Passive exposure: Never   Smokeless tobacco: Current    Types: Snuff   Tobacco comments:    occaisionally  Vaping Use   Vaping status: Never Used  Substance and Sexual Activity   Alcohol use: Yes    Alcohol/week: 10.0 standard drinks of alcohol    Types: 10 Standard drinks or equivalent per week    Comment: wine/liquor weekly   Drug use: No   Sexual activity: Yes  Other Topics Concern   Not on file  Social History Narrative   Not on file   Social Determinants of Health   Financial Resource Strain: Low Risk  (12/06/2022)   Overall Financial Resource Strain (CARDIA)    Difficulty of Paying Living Expenses: Not hard at all  Food Insecurity: No Food Insecurity (12/06/2022)   Hunger Vital Sign    Worried About Running Out of Food in the Last Year: Never true    Ran Out of Food in the Last Year: Never true  Transportation Needs: No Transportation Needs (12/06/2022)   PRAPARE - Administrator, Civil Service (Medical): No    Lack of Transportation (Non-Medical): No  Physical Activity: Insufficiently Active (12/06/2022)   Exercise Vital Sign    Days of Exercise per Week: 3 days    Minutes of Exercise per Session: 30 min  Stress: No Stress Concern Present (12/06/2022)   [employer] of Occupational Health - Occupational Stress Questionnaire    Feeling of Stress : Not at all  Social Connections: Moderately Integrated (12/06/2022)   Social  Connection and Isolation Panel [NHANES]    Frequency of Communication with Friends and Family:  More than three times a week    Frequency of Social Gatherings with Friends and Family: Not on file    Attends Religious Services: Never    Active Member of Clubs or Organizations: Yes    Attends Engineer, structural: More than 4 times per year    Marital Status: Married    Tobacco Counseling Ready to quit: Not Answered Counseling given: Not Answered Tobacco comments: occaisionally   Clinical Intake:  Pre-visit preparation completed: Yes  Pain : No/denies pain Pain Score: 0-No pain     Nutritional Status: BMI 25 -29 Overweight Nutritional Risks: None Diabetes: No     Interpreter Needed?: No  Information entered by :: Kennedy Bucker, LPN   Activities of Daily Living    12/06/2022    8:23 AM 01/25/2022    2:00 AM  In your present state of health, do you have any difficulty performing the following activities:  Hearing? 0   Vision? 0   Difficulty concentrating or making decisions? 0   Walking or climbing stairs? 1   Dressing or bathing? 0   Doing errands, shopping? 0 0  Preparing Food and eating ? N   Using the Toilet? N   In the past six months, have you accidently leaked urine? N   Do you have problems with loss of bowel control? N   Managing your Medications? N   Managing your Finances? N   Housekeeping or managing your Housekeeping? N     Patient Care Team: [name], MD as PCP - General (Internal Medicine) Lanier Prude, MD as PCP - Electrophysiology (Cardiology) End, Cristal Deer, MD as PCP - Cardiology (Cardiology) Nevada Crane, MD as Consulting Physician (Ophthalmology) Wyn Quaker Marlow Baars, MD as Referring Physician (Vascular Surgery) End, Cristal Deer, MD as Consulting Physician (Cardiology)  Indicate any recent Medical Services you may have received from other than [facility] providers in the past year (date may be approximate).      Assessment:   This is a routine wellness examination for Sircharles.  Hearing/Vision screen Hearing Screening - Comments:: No aids Vision Screening - Comments:: Readers- Dr.King   Goals Addressed             This Visit's Progress    DIET - EAT MORE FRUITS AND VEGETABLES         Depression Screen    12/06/2022    8:20 AM 03/22/2022    7:57 AM 03/18/2021   10:20 AM 02/24/2021   11:29 AM 10/14/2020   11:09 AM 09/23/2020    8:54 AM 09/23/2019    8:23 AM  PHQ 2/9 Scores  PHQ - 2 Score 0 0 0 2 2 0 0  PHQ- 9 Score 0 0 0 2 2      Fall Risk    12/06/2022    8:22 AM 03/22/2022    7:57 AM 09/27/2021    8:23 AM 09/22/2021    6:34 AM 03/18/2021   10:20 AM  Fall Risk   Falls in the past year? 0 0 0 0 0  Number falls in past yr: 0 0 0 0 0  Injury with Fall? 0 0 0 0 0  Risk for fall due to : No Fall Risks No Fall Risks   No Fall Risks  Follow up Falls prevention discussed;Falls evaluation completed Falls evaluation completed   Falls evaluation completed    MEDICARE RISK AT HOME: Medicare Risk at Home Any stairs in or around the home?: Yes If so, are  there any without handrails?: No Home free of loose throw rugs in walkways, pet beds, electrical cords, etc?: Yes Adequate lighting in your home to reduce risk of falls?: Yes Life alert?: No Use of a cane, walker or w/c?: No Grab bars in the bathroom?: Yes Shower chair or bench in shower?: Yes Elevated toilet seat or a handicapped toilet?: Yes  TIMED UP AND GO:  Was the test performed?  No    Cognitive Function:        12/06/2022    8:24 AM 09/27/2021    8:23 AM 09/12/2018    8:40 AM 09/06/2017    8:13 AM 07/13/2016    8:30 AM  6CIT Screen  What Year? 0 points 0 points 0 points 0 points 0 points  What month? 0 points 0 points 0 points 0 points 0 points  What time? 0 points 0 points 0 points 0 points   Count back from 20 0 points 2 points 0 points 0 points   Months in reverse 0 points 0 points 0 points 0 points   Repeat phrase  0 points 0 points 0 points 4 points   Total Score 0 points 2 points 0 points 4 points     Immunizations Immunization History  Administered Date(s) Administered   Fluad Quad(high Dose 65+) 11/23/2018, 11/18/2021   Influenza, High Dose Seasonal PF 12/23/2016, 12/12/2017, 12/21/2020   Influenza-Unspecified 12/15/2015, 12/23/2016   PFIZER Comirnaty(Gray Top)Covid-19 Tri-Sucrose Vaccine 04/09/2019, 04/30/2019, 12/02/2019   PFIZER(Purple Top)SARS-COV-2 Vaccination 04/09/2019, 04/30/2019   Pneumococcal Conjugate-13 06/15/2015   Pneumococcal Polysaccharide-23 01/06/2017   Tdap 04/18/2012   Zoster Recombinant(Shingrix) 01/17/2020, 04/22/2020    TDAP status: Due, Education has been provided regarding the importance of this vaccine. Advised may receive this vaccine at local pharmacy or Health Dept. Aware to provide a copy of the vaccination record if obtained from local pharmacy or Health Dept. Verbalized acceptance and understanding.  Flu Vaccine status: Due, Education has been provided regarding the importance of this vaccine. Advised may receive this vaccine at local pharmacy or Health Dept. Aware to provide a copy of the vaccination record if obtained from local pharmacy or Health Dept. Verbalized acceptance and understanding.  Pneumococcal vaccine status: Up to date  Covid-19 vaccine status: Completed vaccines  Qualifies for Shingles Vaccine? Yes   Zostavax completed No   Shingrix Completed?: Yes  Screening Tests Health Maintenance  Topic Date Due   DTaP/Tdap/Td (2 - Td or Tdap) 04/18/2022   INFLUENZA VACCINE  09/29/2022   COVID-19 Vaccine (6 - 2023-24 season) 10/30/2022   Colonoscopy  09/13/2023   Medicare Annual Wellness (AWV)  12/06/2023   Pneumonia Vaccine 27+ Years old  Completed   Hepatitis C Screening  Completed   Zoster Vaccines- Shingrix  Completed   HPV VACCINES  Aged Out    Health Maintenance  Health Maintenance Due  Topic Date Due   DTaP/Tdap/Td (2 - Td or Tdap)  04/18/2022   INFLUENZA VACCINE  09/29/2022   COVID-19 Vaccine (6 - 2023-24 season) 10/30/2022    Colorectal cancer screening: Type of screening: Colonoscopy. Completed 09/13/18. Repeat every 5 years  Lung Cancer Screening: (Low Dose CT Chest recommended if Age 90-80 years, 20 pack-year currently smoking OR have quit w/in 15years.) does not qualify.    Additional Screening:  Hepatitis C Screening: does qualify; Completed 05/21/19  Vision Screening: Recommended annual ophthalmology exams for early detection of glaucoma and other disorders of the eye. Is the patient up to date with their annual eye exam?  Yes  Who is the provider or what is the name of the office in which the patient attends annual eye exams? Dr.King If pt is not established with a provider, would they like to be referred to a provider to establish care? No .   Dental Screening: Recommended annual dental exams for proper oral hygiene   Community Resource Referral / Chronic Care Management: CRR required this visit?  No   CCM required this visit?  No     Plan:     I have personally reviewed and noted the following in the patient's chart:   Medical and social history Use of alcohol, tobacco or illicit drugs  Current medications and supplements including opioid prescriptions. Patient is currently taking opioid prescriptions. Information provided to patient regarding non-opioid alternatives. Patient advised to discuss non-opioid treatment plan with their provider. Functional ability and status Nutritional status Physical activity Advanced directives List of other physicians Hospitalizations, surgeries, and ER visits in previous 12 months Vitals Screenings to include cognitive, depression, and falls Referrals and appointments  In addition, I have reviewed and discussed with patient certain preventive protocols, quality metrics, and best practice recommendations. A written personalized care plan for preventive  services as well as general preventive health recommendations were provided to patient.     Hal Hope, LPN   [id number]   After Visit Summary: (MyChart) Due to this being a telephonic visit, the after visit summary with patients personalized plan was offered to patient via MyChart   Nurse Notes: none

## 2022-12-07 ENCOUNTER — Other Ambulatory Visit: Payer: PPO

## 2022-12-08 ENCOUNTER — Ambulatory Visit
Admission: RE | Admit: 2022-12-08 | Discharge: 2022-12-08 | Disposition: A | Discharge status: Home / Self Care | Payer: PPO | Source: Ambulatory Visit | Attending: Family Medicine | Admitting: Family Medicine

## 2022-12-08 DIAGNOSIS — M5416 Radiculopathy, lumbar region: Secondary | ICD-10-CM

## 2022-12-08 DIAGNOSIS — M47816 Spondylosis without myelopathy or radiculopathy, lumbar region: Secondary | ICD-10-CM | POA: Diagnosis not present

## 2022-12-08 DIAGNOSIS — M5126 Other intervertebral disc displacement, lumbar region: Secondary | ICD-10-CM | POA: Diagnosis not present

## 2022-12-08 DIAGNOSIS — M48061 Spinal stenosis, lumbar region without neurogenic claudication: Secondary | ICD-10-CM | POA: Diagnosis not present

## 2022-12-12 NOTE — Progress Notes (Unsigned)
This encounter was created in error - please disregard.

## 2022-12-13 ENCOUNTER — Encounter: Payer: Self-pay | Admitting: Cardiology

## 2022-12-13 ENCOUNTER — Ambulatory Visit: Payer: PPO | Attending: Cardiology

## 2022-12-13 ENCOUNTER — Ambulatory Visit: Payer: PPO | Admitting: Cardiology

## 2022-12-13 DIAGNOSIS — I5022 Chronic systolic (congestive) heart failure: Secondary | ICD-10-CM | POA: Diagnosis not present

## 2022-12-13 DIAGNOSIS — I493 Ventricular premature depolarization: Secondary | ICD-10-CM

## 2022-12-13 DIAGNOSIS — Z95810 Presence of automatic (implantable) cardiac defibrillator: Secondary | ICD-10-CM

## 2022-12-13 LAB — ECHOCARDIOGRAM COMPLETE
AR max vel: 3.1 cm2
AV Area VTI: 3.13 cm2
AV Area mean vel: 2.94 cm2
AV Mean grad: 2 mm[Hg]
AV Peak grad: 3.6 mm[Hg]
Ao pk vel: 0.94 m/s
Area-P 1/2: 4.68 cm2
Calc EF: 48.7 %
S' Lateral: 4.4 cm
Single Plane A2C EF: 48.8 %
Single Plane A4C EF: 48 %

## 2022-12-13 MED ORDER — PERFLUTREN LIPID MICROSPHERE
1.0000 mL | INTRAVENOUS | Status: AC | PRN
  Administered 2022-12-13: 2 mL via INTRAVENOUS

## 2022-12-14 ENCOUNTER — Telehealth: Payer: Self-pay | Admitting: Cardiology

## 2022-12-14 NOTE — Progress Notes (Deleted)
Cardiology Office Note Date:  12/14/2022  Patient ID:  [name], [name] [birth year], MRN [id number] PCP:  [name], MD  Cardiologist:  Yvonne Kendall, MD Electrophysiologist: Lanier Prude, MD    Chief Complaint: 6mon device check, PVC  History of Present Illness: [name] is a 72 y.o. male with PMH notable for HFrEF, NICM, PVCs, s/p CRT-D, CAD s/p PCI, CABG, carotid stenosis, PAD; seen today for Lanier Prude, MD for routine electrophysiology followup.  He last saw Dr. Lalla Brothers 11/2021. Has historically low BiV pacing d/t PVCs. Amiodarone had been started earlier in 2023, but patient stopped d/t side effects of feeling poorly. Mexiletine was started at that visit and if unsuccessful at [facility] suppression, planned to initiate sotalol. I saw him 05/2022 and he was doing well. VP still low at 86%, but patient feeling very well. He was not interested in transitioning to sotalol.   On follow-up today,  *** fluid status *** exercise limitations    .  he denies chest pain, palpitations, dyspnea, PND, orthopnea, nausea, vomiting, dizziness, syncope, edema, weight gain, or early satiety.     Device Information: [employer] J CRT-D, 03/23/2020; dx ICM  AAD History: Amiodarone - 07/2021 - dc'd sometime prior to 11/2021. Pt felt poorly Mexiletine - 11/2021  Past Medical History:  Diagnosis Date   Abnormal nuclear cardiac imaging test 08/08/2015   AICD (automatic cardioverter/defibrillator) present    pacemaker/defib St Jude/[employer]   Arthritis    fingers   Carotid artery occlusion    CHF (congestive heart failure) (HCC)    Coronary atherosclerosis of native coronary artery 01/29/2013   11/05/19 R/LHC 80% dLMCA stenosis small diffusely dz dLAD, chronically occluded OM1 50%mRCA lesion, widely patent mLCx strent, moderately elevated L heart filling pressures, mild to moderate RH filling pressures, normal to moderately reduced CO   Duodenal erosion    Encounter for screening for  lung cancer 07/13/2016   Esophageal stenosis    esophageal dilation   GERD (gastroesophageal reflux disease)    H. 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 Cardiac catheterization-nonobstructive 60% RCA disease-subsequent nuclear stress test-9 minutes, low risk, mild inferior wall hypokinesis    Pain in limb 12/19/2017   Peripheral vascular disease (HCC)    Unstable angina (HCC) 11/25/2017    Past Surgical History:  Procedure Laterality Date   ABDOMINAL AORTOGRAM W/LOWER EXTREMITY Left 01/24/2022   Procedure: ABDOMINAL AORTOGRAM W/LOWER EXTREMITY;  Surgeon: Maeola Harman, MD;  Location: [facility] INVASIVE CV LAB;  Service: Cardiovascular;  Laterality: Left;   APPENDECTOMY     BACK SURGERY     BIV ICD INSERTION CRT-D N/A 03/23/2020   Procedure: BIV ICD INSERTION CRT-D;  Surgeon: Lanier Prude, MD;  Location: [facility] INVASIVE CV LAB;  Service: Cardiovascular;  Laterality: N/A;   CARDIAC CATHETERIZATION N/A 08/07/2015   Procedure: Left Heart Cath and Coronary Angiography;  Surgeon: Jake Bathe, MD;  Location: [facility] INVASIVE CV LAB;  Service: Cardiovascular;  Laterality: N/A;   CARDIAC CATHETERIZATION N/A 08/07/2015   Procedure: Coronary Stent Intervention;  Surgeon: Jake Bathe, MD;  Location: [facility] INVASIVE CV LAB;  Service: Cardiovascular;  Laterality: N/A;   CARDIAC CATHETERIZATION N/A 08/07/2015   Procedure: Coronary Stent Intervention;  Surgeon: Peter M Swaziland, MD;  Location: [facility] INVASIVE CV LAB;  Service: Cardiovascular;  Laterality: N/A;   CAROTID ENDARTERECTOMY  01/05/2006   Right  CEA with DPA   CATARACT EXTRACTION W/ INTRAOCULAR LENS IMPLANT Left 12/04/2017   CATARACT EXTRACTION W/PHACO Left 12/04/2017   Procedure: CATARACT EXTRACTION PHACO AND INTRAOCULAR LENS PLACEMENT (IOC) LEFT;  Surgeon:  Nevada Crane, MD;  Location: [facility] SURGERY CNTR;  Service: Ophthalmology;  Laterality: Left;   CATARACT EXTRACTION W/PHACO Right 02/06/2018   Procedure: CATARACT EXTRACTION PHACO AND INTRAOCULAR LENS PLACEMENT (IOC)RIGHT;  Surgeon: [place] Crane, MD;  Location: [facility] SURGERY [facility]TR;  Service: Ophthalmology;  Laterality: Right;   COLONOSCOPY  05/20/2008   COLONOSCOPY WITH PROPOFOL N/A 09/13/2018   Procedure: COLONOSCOPY WITH BIOPSY;  Surgeon: Midge Minium, MD;  Location: [facility] SURGERY CNTR;  Service: Endoscopy;  Laterality: N/A;   CORONARY ARTERY BYPASS GRAFT N/A 11/11/2019   Procedure: CORONARY ARTERY BYPASS GRAFTING (CABG) USING LIMA to Diag1; ENDOSCOPICALLY HARVESTED RIGHT GREATER SAPHENOUS VEIN: SVG to OM1; SVG to OM2; SVG to PDA.;  Surgeon: Kerin Perna, MD;  Location: [facility] OR;  Service: Open Heart Surgery;  Laterality: N/A;   CORONARY STENT PLACEMENT  08/07/2015   MID CIRCUMFLEX   ENDARTERECTOMY FEMORAL Bilateral 09/30/2020   Procedure: ENDARTERECTOMY FEMORAL;  Surgeon: Annice Needy, MD;  Location: [facility] ORS;  Service: Vascular;  Laterality: Bilateral;   ENDARTERECTOMY POPLITEAL Left 01/25/2022   Procedure: ENDARTERECTOMY POPLITEAL;  Surgeon: Maeola Harman, MD;  Location: [facility] OR;  Service: Vascular;  Laterality: Left;   ENDOVEIN HARVEST OF GREATER SAPHENOUS VEIN Right 11/11/2019   Procedure: ENDOVEIN HARVEST OF GREATER SAPHENOUS VEIN;  Surgeon: Kerin Perna, MD;  Location: [facility] OR;  Service: Open Heart Surgery;  Laterality: Right;   ESOPHAGOGASTRODUODENOSCOPY (EGD) WITH PROPOFOL N/A 02/11/2019   Procedure: ESOPHAGOGASTRODUODENOSCOPY (EGD) WITH BIOPSY and  Dilation;  Surgeon: Midge Minium, MD;  Location: [facility] SUR[facility]ERY [facility];  Service: Endoscopy;  Laterality: N/A;   ESOPHAGOGASTRODUODENOSCOPY (EGD) WITH PROPOFOL N/A 12/23/2021   Procedure: ESOPHAGOGASTRODUODENOSCOPY (EGD) WITH PROPOFOL;  Surgeon: Midge Minium, MD;  Location: [facility] ENDOSCOPY;  Service: Endoscopy;  Laterality:  N/A;   FEMORAL-FEMORAL BYPASS GRAFT Left 01/25/2022   Procedure: left Femoral to below the knee Popliteal bypass.;  Surgeon: Maeola Harman, MD;  Location: [facility] OR;  Service: Vascular;  Laterality: Left;   HIP SURGERY Left 10/2016   left hip tendon repair   LEFT HEART CATH AND CORONARY ANGIOGRAPHY N/A 11/27/2017   Procedure: LEFT HEART CATH AND CORONARY ANGIOGRAPHY;  Surgeon: Iran Ouch, MD;  Location: [facility] INVASIVE CV LAB;  Service: Cardiovascular;  Laterality: N/A;   LOWER EXTREMITY ANGIOGRAPHY Left 02/12/2018   Procedure: LOWER EXTREMITY ANGIOGRAPHY;  Surgeon: Annice Needy, MD;  Location: [facility] INVASIVE CV LAB;  Service: Cardiovascular;  Laterality: Left;   LOWER EXTREMITY ANGIOGRAPHY Left 03/07/2018   Procedure: LOWER EXTREMITY ANGIOGRAPHY;  Surgeon: Annice Needy, MD;  Location: [facility] INVASIVE CV LAB;  Service: Cardiovascular;  Laterality: Left;   LOWER EXTREMITY ANGIOGRAPHY Left 06/04/2018   Procedure: LOWER EXTREMITY ANGIOGRAPHY;  Surgeon: Annice Needy, MD;  Location: [facility] INVASIVE CV LAB;  Service: Cardiovascular;  Laterality: Left;   LOWER EXTREMITY ANGIOGRAPHY Left 09/17/2020   Procedure: LOWER EXTREMITY ANGIOGRAPHY;  Surgeon: Annice Needy, MD;  Location: [facility] INVASIVE CV LAB;  Service: Cardiovascular;  Laterality: Left;   LOWER EXTREMITY ANGIOGRAPHY Left 09/28/2020   Procedure: LOWER EXTREMITY ANGIOGRAPHY;  Surgeon: Annice Needy, MD;  Location: [facility] INVASIVE CV LAB;  Service: Cardiovascular;  Laterality: Left;  LOWER EXTREMITY ANGIOGRAPHY N/A 02/15/2021   Procedure: LOWER EXTREMITY ANGIOGRAPHY;  Surgeon: Annice Needy, MD;  Location: [facility] INVASIVE CV LAB;  Service: Cardiovascular;  Laterality: N/A;   LOWER EXTREMITY ANGIOGRAPHY Left 12/27/2021   Procedure: Lower Extremity Angiography;  Surgeon: Annice Needy, MD;  Location: [facility] INVASIVE CV LAB;  Service: Cardiovascular;  Laterality: Left;   LOWER EXTREMITY ANGIOGRAPHY Left 12/28/2021   Procedure: Lower Extremity Angiography;   Surgeon: Annice Needy, MD;  Location: [facility] INVASIVE CV LAB;  Service: Cardiovascular;  Laterality: Left;   PLACEMENT OF IMPELLA LEFT VENTRICULAR ASSIST DEVICE N/A 11/11/2019   Procedure: PLACEMENT OF IMPELLA LEFT VENTRICULAR ASSIST DEVICE 5.5;  Surgeon: Kerin Perna, MD;  Location: [facility] OR;  Service: Open Heart Surgery;  Laterality: N/A;  Midline Sternotomy   POLYPECTOMY N/A 09/13/2018   Procedure: POLYPECTOMY;  Surgeon: Midge Minium, MD;  Location: [facility] SURGERY CNTR;  Service: Endoscopy;  Laterality: N/A;   POLYPECTOMY N/A 02/11/2019   Procedure: POLYPECTOMY;  Surgeon: Midge Minium, MD;  Location: [facility] SURGERY [facility];  Service: Endoscopy;  Laterality: N/A;   REMOVAL OF IMPELLA LEFT VENTRICULAR ASSIST DEVICE N/A 11/15/2019   Procedure: REMOVAL OF IMPELLA 5.5 LEFT VENTRICULAR ASSIST DEVICE;  Surgeon: Kerin Perna, MD;  Location: [facility] OR;  Service: Open Heart Surgery;  Laterality: N/A;   RIGHT/LEFT HEART CATH AND CORONARY ANGIOGRAPHY N/A 11/05/2019   Procedure: RIGHT/LEFT HEART CATH AND CORONARY ANGIOGRAPHY;  Surgeon: Yvonne Kendall, MD;  Location: [facility] INVASIVE CV LAB;  Service: Cardiovascular;  Laterality: N/A;   SPINE SURGERY     TEE WITHOUT CARDIOVERSION N/A 11/11/2019   Procedure: TRANSESOPHAGEAL ECHOCARDIOGRAM (TEE);  Surgeon: Donata Clay, Theron Arista, MD;  Location: [facility] OR;  Service: Open Heart Surgery;  Laterality: N/A;   TEE WITHOUT CARDIOVERSION N/A 11/15/2019   Procedure: TRANSESOPHAGEAL ECHOCARDIOGRAM (TEE);  Surgeon: Donata Clay, Theron Arista, MD;  Location: [facility] OR;  Service: Open Heart Surgery;  Laterality: N/A;   TONSILLECTOMY      Current Outpatient Medications  Medication Instructions   aspirin EC 81 mg, Oral, Daily   carvedilol (COREG) 12.5 MG tablet TAKE (1) TABLET BY MOUTH TWICE DAILY.   colchicine 0.6 mg, Oral, 2 times daily   methocarbamol (ROBAXIN) 500 mg, Oral, 1 po q occasionally as needed for pain/spasm. No more than one every few days.   mexiletine (MEXITIL) 150 mg, Oral, 2 times  daily   nitroGLYCERIN (NITROSTAT) 0.4 mg, Sublingual, Every 5 min PRN   oxyCODONE-acetaminophen (PERCOCET) 5-325 MG tablet 1 tablet, Oral, Every 8 hours PRN   Praluent 150 mg, Subcutaneous, Every 14 days   traZODone (DESYREL) 50 mg, Oral, At bedtime PRN, for sleep    Social History:  The patient  reports that he quit smoking about 17 years ago. His smoking use included cigarettes. He started smoking about 52 years ago. He has a 43.8 pack-year smoking history. He has never been exposed to tobacco smoke. His smokeless tobacco use includes snuff. He reports current alcohol use of about 10.0 standard drinks of alcohol per week. He reports that he does not use drugs.   Family History:  The patient's family history includes Cancer (age of onset: 29) in his father; Coronary artery disease in his mother; Diabetes in his mother; Heart attack in his father and mother; Heart disease in his father and mother; Hypertension in his father and mother; Stroke in his father.  ROS:  Please see the history of present illness. All other systems are reviewed and otherwise negative.   PHYSICAL EXAM:  VS:  There were no vitals taken for this visit. BMI: There is no height or weight on file to calculate BMI.  GEN- The patient is well appearing, alert and oriented x 3 today.   Lungs- Clear to ausculation bilaterally, normal work of breathing.  Heart- Irregularly irregular rate and rhythm, no murmurs, rubs or gallops Extremities- No peripheral edema, warm, dry Skin-  device pocket well-healed   Device interrogation done today and reviewed by myself:  Battery good Lead thresholds, impedence, sensing stable  BiV pacing 86% Frequent AMS episodes initiated by PVCs No changes made today  EKG is ordered. Personal review of EKG from today shows: A-paced, BiV paced with single PVC; rate 76bpm   Recent Labs: 03/22/2022: ALT 21; BUN 20; Creatinine, Ser 1.00; Hemoglobin 14.1; Platelets 200; Potassium 5.0; Sodium 140;  TSH 2.510  03/22/2022: Chol/HDL Ratio 2.5; Cholesterol, Total 135; HDL 55; LDL Chol Calc (NIH) 59; Triglycerides 120   CrCl cannot be calculated (Patient's most recent lab result is older than the maximum 21 days allowed.).   Wt Readings from Last 3 Encounters:  12/13/22 183 lb (83 kg)  11/30/22 183 lb 3.2 oz (83.1 kg)  06/10/22 181 lb (82.1 kg)     Additional studies reviewed include: Previous EP, cardiology notes.   TTE, 12/13/2022  1. Left ventricular ejection fraction, by estimation, is 40 to 45%. Left ventricular ejection fraction by 3D volume is 43 %. The left ventricle has mildly decreased function. The left ventricle demonstrates mild global hypokinesis with moderate inferior wall hypokinesis. The left ventricular internal cavity size was mildly dilated. Left ventricular diastolic parameters are consistent with Grade I diastolic dysfunction (impaired relaxation). The average left ventricular global longitudinal strain is  -9.5 %.   2. Right ventricular systolic function is normal. The right ventricular size is normal.   3. Left atrial size was mildly dilated.   4. The mitral valve is normal in structure. Mild mitral valve regurgitation. No evidence of mitral stenosis.   5. The aortic valve is tricuspid. Aortic valve regurgitation is mild. No aortic stenosis is present.   6. There is mild dilatation of the ascending aorta, measuring 42 mm.   7. The inferior vena cava is normal in size with greater than 50% respiratory variability, suggesting right atrial pressure of 3 mmHg.   TTE, 08/10/2020  1. Left ventricular ejection fraction, by estimation, is 50 to 55%. The left ventricle has low normal function. The left ventricle has no regional wall motion abnormalities. The left ventricular internal cavity size was mildly dilated. Left ventricular diastolic parameters are indeterminate. The average left ventricular global longitudinal strain is -14.0 %. The global longitudinal strain is  abnormal.   2. Right ventricular systolic function is normal. The right ventricular size is normal. Tricuspid regurgitation signal is inadequate for assessing PA pressure.   3. Left atrial size was mildly dilated.   4. The mitral valve is normal in structure. Mild mitral valve regurgitation. No evidence of mitral stenosis.   5. The aortic valve is normal in structure. Aortic valve regurgitation is trivial. Mild aortic valve sclerosis is present, with no evidence of aortic valve stenosis.   TTE, 03/02/2020  1. Left ventricular ejection fraction, by estimation, is 30 to 35%. The left ventricle has moderately decreased function. The left ventricle demonstrates global hypokinesis with septal-lateral dyssynchrony due to LBBB. There is mild left ventricular hypertrophy. Left ventricular diastolic parameters are consistent with Grade I diastolic dysfunction (impaired relaxation).   2. The mitral valve  is normal in structure. Trivial mitral valve regurgitation. No evidence of mitral stenosis.   3. The aortic valve is tricuspid. Aortic valve regurgitation is trivial. No aortic stenosis is present.   4. Right ventricular systolic function is mildly reduced. The right ventricular size is normal. Tricuspid regurgitation signal is inadequate for assessing PA pressure.   5. The inferior vena cava is normal in size with greater than 50% respiratory variability, suggesting right atrial pressure of 3 mmHg.    ASSESSMENT AND PLAN:  #) St. Jude CRT-D in situ #) PVCs #) CHF Device functioning well, see paceart for details Tolerating mexiletine well BiV pacing 86% ***  Discussed transitioning to sotalol for potentially better PVC suppression Patient is not interested in sotalol admission at this time given how well he feels Will update echo to confirm recovered EF with ongoing PVC burden     Current medicines are reviewed at length with the patient today.   The patient does not have concerns regarding his  medicines.  The following changes were made today:  none  Labs/ tests ordered today include:  No orders of the defined types were placed in this encounter.    Disposition: Follow up with Dr. Lalla Brothers in in 6 months   Signed, Sherie Don, NP  12/14/22  3:52 PM  Electrophysiology [facility]

## 2022-12-14 NOTE — Progress Notes (Unsigned)
Cardiology Office Note    Date:  12/15/2022   ID:  [name], DOB 04/28/1950, MRN [id number]  PCP:  [name], MD  Cardiologist:  Yvonne Kendall, MD  Electrophysiologist:  Lanier Prude, MD   Chief Complaint: Follow up  History of Present Illness:   [name] is a 72 y.o. 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  History of CAD with NSTEMI in 2017 s/p PCI to the LCx. He underwent repeat LHC in 10/2017 with widely patent LCx without significant restenosis and otherwise mild to moderate CAD.  EF 40-45%. Echo showed EF 45-50%. Repeat limited echo in 01/2018 showed an EF of 40-45%. Medical management advised. He was seen in clinic in 09/2019 with DOE and abnormal EKG. Subsequent LHC on 11/05/2019 showed multivessel CAD with development of severe distal LMCA disease. Repeat echo revealed EF 25-30%, LV severely dilated, and mild MR. He was transferred to [facility] for and underwent 4-vessel CABG (LIMA to LAD, SVG to OM1, SVG to OM 2, SVG to PDA) with placement of Impella 5.5 on 11/11/2019 (removed on postoperative day 4). He did have postoperative Afib with sinus rhythm restored once started on IV amiodarone and transition to oral amiodarone and apixaban at discharge.  Amiodarone and digoxin previously discontinued due to nausea and supratherapeutic medication level, respectively.  Repeat echo in  02/2020 showed an EF of 30 to 35%, goal hypokinesis with septal lateral dyssynchrony in the setting of LBBB, reduced RV systolic function, mild LVH, and grade 1 diastolic dysfunction.  Given his persistently low EF in the setting of chronic LBBB he underwent CRT-D placement in 2022.  GDMT is also previously been limited by hyperkalemia and concern that his GDMT was making him feel worse than he did before CABG.  Following CRT-D placement, repeat echo in 07/2020 showed low normal LV systolic function with an EF of 50 to 55%, no regional wall motion abnormalities, mildly dilated LV internal cavity size, normal RV systolic function and ventricular cavity size, mildly dilated left atrium, mild mitral regurgitation, and mild aortic valve sclerosis without evidence of stenosis.  With regards to his CRT-D, EP notes he historically has low BiV pacing due to PVCs leading to reinitiation of amiodarone, though this was ultimately discontinued due to off target effects reporting he generally did not feel well on the medication.  In this setting, he was initiated on mexiletine in 11/2021 in an effort to suppress PVCs.  Device interrogation in 05/2022 with 86% BiV pacing.  In this setting, echo was recommended to assess EF with PVC burden.  Most recent device interrogation from 08/2022 showed a less than 1% A-fib burden with 82% BiV pacing.  Echo on 12/13/2022 at showed an EF of 40 to 45%, mild global hypokinesis with moderate inferior wall hypokinesis, mildly dilated LV internal cavity size, grade 1 diastolic dysfunction, normal RV systolic function and ventricular cavity size, mildly dilated left atrium, mild  mitral regurgitation, mild aortic insufficiency, and mild dilatation of the ascending aorta measuring 42 mm (aortic valve documented to be tricuspid).  With regards to his PAD and carotid artery disease, most recent ABI from 11/2022 were normal bilaterally with decreased TBI.  LE 8 showed patent left femoral below-knee  popliteal artery bypass graft without evidence of hemodynamically significant stenosis.  Carotid artery ultrasound in 11/2022 showed 1 to 39% right-sided ICA stenosis and 40 to 59% left ICA stenosis, which was improved from study dated 04/2022.  He comes in today and is without symptoms of angina or cardiac decompensation.  He does continue to note ongoing generalized malaise and fatigue.  He indicates that these symptoms are more noticeable following the initiation of mexiletine.  No lower extremity swelling or progressive orthopnea.  Weight remains stable.  No dizziness, presyncope, or syncope.  No falls or symptoms concerning for bleeding.   Labs independently reviewed: 02/2022 - TSH normal, TC 135, TG 120, HDL 55, LDL 59, BUN 20, serum creatinine 1.0, potassium 5.0, albumin 4.4, AST/ALT normal, Hgb 14.1, PLT 200 10/2019 - A1c 5.6  Past Medical History:  Diagnosis Date   Abnormal nuclear cardiac imaging test 08/08/2015   AICD (automatic cardioverter/defibrillator) present    pacemaker/defib [employer]   Arthritis    fingers   Carotid artery occlusion    CHF (congestive heart failure) (HCC)    Coronary atherosclerosis of native coronary artery 01/29/2013   11/05/19 R/LHC 80% dLMCA stenosis small diffusely dz dLAD, chronically occluded OM1 50%mRCA lesion, widely patent mLCx strent, moderately elevated L heart filling pressures, mild to moderate RH filling pressures, normal to moderately reduced CO   Duodenal erosion    Encounter for screening for lung cancer 07/13/2016   Esophageal stenosis    esophageal dilation   GERD (gastroesophageal reflux disease)    H. 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 Cardiac catheterization-nonobstructive 60% RCA disease-subsequent nuclear stress test-9 minutes, low  risk, mild inferior wall hypokinesis    Pain in limb 12/19/2017   Peripheral vascular disease (HCC)    Unstable angina (HCC) 11/25/2017    Past Surgical History:  Procedure Laterality Date   ABDOMINAL AORTOGRAM W/LOWER EXTREMITY Left 01/24/2022   Procedure: ABDOMINAL AORTOGRAM W/LOWER EXTREMITY;  Surgeon: Maeola Harman, MD;  Location: [facility] INVASIVE CV LAB;  Service: Cardiovascular;  Laterality: Left;   APPENDECTOMY     BACK SURGERY     BIV ICD INSERTION CRT-D N/A 03/23/2020   Procedure: BIV ICD INSERTION CRT-D;  Surgeon: Lanier Prude, MD;  Location: [facility] INVASIVE CV LAB;  Service: Cardiovascular;  Laterality: N/A;   CARDIAC CATHETERIZATION N/A 08/07/2015   Procedure: Left Heart Cath and Coronary Angiography;  Surgeon: Jake Bathe, MD;  Location: [facility] INVASIVE CV LAB;  Service: Cardiovascular;  Laterality: N/A;   CARDIAC CATHETERIZATION N/A 08/07/2015   Procedure: Coronary Stent Intervention;  Surgeon: Jake Bathe, MD;  Location: [facility] INVASIVE CV LAB;  Service: Cardiovascular;  Laterality: N/A;   CARDIAC CATHETERIZATION N/A 08/07/2015   Procedure: Coronary Stent Intervention;  Surgeon: Peter M Swaziland, MD;  Location: [facility] INVASIVE CV LAB;  Service: Cardiovascular;  Laterality: N/A;   CAROTID ENDARTERECTOMY  01/05/2006   Right  CEA with DPA   CATARACT EXTRACTION W/ INTRAOCULAR LENS IMPLANT Left  12/04/2017   CATARACT EXTRACTION W/PHACO Left 12/04/2017   Procedure: CATARACT EXTRACTION PHACO AND INTRAOCULAR LENS PLACEMENT (IOC) LEFT;  Surgeon: Nevada Crane, MD;  Location: [facility] SURGERY CNTR;  Service: Ophthalmology;  Laterality: Left;   CATARACT EXTRACTION W/PHACO Right 02/06/2018   Procedure: CATARACT EXTRACTION PHACO AND INTRAOCULAR LENS PLACEMENT (IOC)RIGHT;  Surgeon: Nevada Crane, MD;  Location: [facility] SURGERY CNTR;  Service: Ophthalmology;  Laterality: Right;   COLONOSCOPY  05/20/2008   COLONOSCOPY WITH PROPOFOL N/A 09/13/2018   Procedure: COLONOSCOPY WITH BIOPSY;   Surgeon: Midge Minium, MD;  Location: [facility] SURGERY CNTR;  Service: Endoscopy;  Laterality: N/A;   CORONARY ARTERY BYPASS GRAFT N/A 11/11/2019   Procedure: CORONARY ARTERY BYPASS GRAFTING (CABG) USING LIMA to Diag1; ENDOSCOPICALLY HARVESTED RIGHT GREATER SAPHENOUS VEIN: SVG to OM1; SVG to OM2; SVG to PDA.;  Surgeon: Kerin Perna, MD;  Location: [facility] OR;  Service: Open Heart Surgery;  Laterality: N/A;   CORONARY STENT PLACEMENT  08/07/2015   MID CIRCUMFLEX   ENDARTERECTOMY FEMORAL Bilateral 09/30/2020   Procedure: ENDARTERECTOMY FEMORAL;  Surgeon: Annice Needy, MD;  Location: [facility] ORS;  Service: Vascular;  Laterality: Bilateral;   ENDARTERECTOMY POPLITEAL Left 01/25/2022   Procedure: ENDARTERECTOMY POPLITEAL;  Surgeon: Maeola Harman, MD;  Location: [facility] OR;  Service: Vascular;  Laterality: Left;   ENDOVEIN HARVEST OF GREATER SAPHENOUS VEIN Right 11/11/2019   Procedure: ENDOVEIN HARVEST OF GREATER SAPHENOUS VEIN;  Surgeon: Kerin Perna, MD;  Location: [facility] OR;  Service: Open Heart Surgery;  Laterality: Right;   ESOPHAGOGASTRODUODENOSCOPY (EGD) WITH PROPOFOL N/A 02/11/2019   Procedure: ESOPHAGOGASTRODUODENOSCOPY (EGD) WITH BIOPSY and  Dilation;  Surgeon: Midge Minium, MD;  Location: [facility] SURGERY CNTR;  Service: Endoscopy;  Laterality: N/A;   ESOPHAGOGASTRODUODENOSCOPY (EGD) WITH PROPOFOL N/A 12/23/2021   Procedure: ESOPHAGOGASTRODUODENOSCOPY (EGD) WITH PROPOFOL;  Surgeon: Midge Minium, MD;  Location: [facility] ENDOSCOPY;  Service: Endoscopy;  Laterality: N/A;   FEMORAL-FEMORAL BYPASS GRAFT Left 01/25/2022   Procedure: left Femoral to below the knee Popliteal bypass.;  Surgeon: Maeola Harman, MD;  Location: [facility] OR;  Service: Vascular;  Laterality: Left;   HIP SURGERY Left 10/2016   left hip tendon repair   LEFT HEART CATH AND CORONARY ANGIOGRAPHY N/A 11/27/2017   Procedure: LEFT HEART CATH AND CORONARY ANGIOGRAPHY;  Surgeon: Iran Ouch, MD;  Location: [facility] INVASIVE CV LAB;   Service: Cardiovascular;  Laterality: N/A;   LOWER EXTREMITY ANGIOGRAPHY Left 02/12/2018   Procedure: LOWER EXTREMITY ANGIOGRAPHY;  Surgeon: Annice Needy, MD;  Location: [facility] INVASIVE CV LAB;  Service: Cardiovascular;  Laterality: Left;   LOWER EXTREMITY ANGIOGRAPHY Left 03/07/2018   Procedure: LOWER EXTREMITY ANGIOGRAPHY;  Surgeon: Annice Needy, MD;  Location: [facility] INVASIVE CV LAB;  Service: Cardiovascular;  Laterality: Left;   LOWER EXTREMITY ANGIOGRAPHY Left 06/04/2018   Procedure: LOWER EXTREMITY ANGIOGRAPHY;  Surgeon: Annice Needy, MD;  Location: [facility] INVASIVE CV LAB;  Service: Cardiovascular;  Laterality: Left;   LOWER EXTREMITY ANGIOGRAPHY Left 09/17/2020   Procedure: LOWER EXTREMITY ANGIOGRAPHY;  Surgeon: Annice Needy, MD;  Location: [facility] INVASIVE CV LAB;  Service: Cardiovascular;  Laterality: Left;   LOWER EXTREMITY ANGIOGRAPHY Left 09/28/2020   Procedure: LOWER EXTREMITY ANGIOGRAPHY;  Surgeon: Annice Needy, MD;  Location: [facility] INVASIVE CV LAB;  Service: Cardiovascular;  Laterality: Left;   LOWER EXTREMITY ANGIOGRAPHY N/A 02/15/2021   Procedure: LOWER EXTREMITY ANGIOGRAPHY;  Surgeon: Annice Needy, MD;  Location: [facility] INVASIVE CV LAB;  Service: Cardiovascular;  Laterality: N/A;   LOWER EXTREMITY  ANGIOGRAPHY Left 12/27/2021   Procedure: Lower Extremity Angiography;  Surgeon: Annice Needy, MD;  Location: [facility] INVASIVE CV LAB;  Service: Cardiovascular;  Laterality: Left;   LOWER EXTREMITY ANGIOGRAPHY Left 12/28/2021   Procedure: Lower Extremity Angiography;  Surgeon: Annice Needy, MD;  Location: [facility] INVASIVE CV LAB;  Service: Cardiovascular;  Laterality: Left;   PLACEMENT OF IMPELLA LEFT VENTRICULAR ASSIST DEVICE N/A 11/11/2019   Procedure: PLACEMENT OF IMPELLA LEFT VENTRICULAR ASSIST DEVICE 5.5;  Surgeon: Kerin Perna, MD;  Location: [facility] OR;  Service: Open Heart Surgery;  Laterality: N/A;  Midline Sternotomy   POLYPECTOMY N/A 09/13/2018   Procedure: POLYPECTOMY;  Surgeon: Midge Minium, MD;   Location: [facility] SURGERY CNTR;  Service: Endoscopy;  Laterality: N/A;   POLYPECTOMY N/A 02/11/2019   Procedure: POLYPECTOMY;  Surgeon: Midge Minium, MD;  Location: [facility] SURGERY CNTR;  Service: Endoscopy;  Laterality: N/A;   REMOVAL OF IMPELLA LEFT VENTRICULAR ASSIST DEVICE N/A 11/15/2019   Procedure: REMOVAL OF IMPELLA 5.5 LEFT VENTRICULAR ASSIST DEVICE;  Surgeon: Kerin Perna, MD;  Location: [facility] OR;  Service: Open Heart Surgery;  Laterality: N/A;   RIGHT/LEFT HEART CATH AND CORONARY ANGIOGRAPHY N/A 11/05/2019   Procedure: RIGHT/LEFT HEART CATH AND CORONARY ANGIOGRAPHY;  Surgeon: Yvonne Kendall, MD;  Location: [facility] INVASIVE CV LAB;  Service: Cardiovascular;  Laterality: N/A;   SPINE SURGERY     TEE WITHOUT CARDIOVERSION N/A 11/11/2019   Procedure: TRANSESOPHAGEAL ECHOCARDIOGRAM (TEE);  Surgeon: Donata Clay, Theron Arista, MD;  Location: [facility] OR;  Service: Open Heart Surgery;  Laterality: N/A;   TEE WITHOUT CARDIOVERSION N/A 11/15/2019   Procedure: TRANSESOPHAGEAL ECHOCARDIOGRAM (TEE);  Surgeon: Donata Clay, Theron Arista, MD;  Location: [facility] OR;  Service: Open Heart Surgery;  Laterality: N/A;   TONSILLECTOMY      Current Medications: Current Meds  Medication Sig   Alirocumab (PRALUENT) 150 MG/ML SOAJ INJECT 1 ML (150 MG TOTAL) INTO THE SKIN EVERY 14 (FOURTEEN) DAYS.   aspirin EC 81 MG tablet Take 1 tablet (81 mg total) by mouth daily.   carvedilol (COREG) 12.5 MG tablet TAKE (1) TABLET BY MOUTH TWICE DAILY.   colchicine 0.6 MG tablet TAKE 1 TABLET BY MOUTH 2 TIMES DAILY. (Patient taking differently: Take 0.6 mg by mouth 2 (two) times daily as needed (gout attacks).)   diphenhydrAMINE (BENADRYL) 50 MG tablet Take 1 tablet (50 mg total) by mouth once for 1 dose. 1 hour prior to test   methocarbamol (ROBAXIN) 500 MG tablet Take 500 mg by mouth. 1 po q occasionally as needed for pain/spasm. No more than one every few days.   mexiletine (MEXITIL) 150 MG capsule Take 1 capsule (150 mg total) by mouth 2 (two) times  daily.   nitroGLYCERIN (NITROSTAT) 0.4 MG SL tablet Place 1 tablet (0.4 mg total) under the tongue every 5 (five) minutes as needed for chest pain.   oxyCODONE-acetaminophen (PERCOCET) 5-325 MG tablet Take 1 tablet by mouth every 8 (eight) hours as needed.   predniSONE (DELTASONE) 50 MG tablet Take 1 tablet by mouth 13 hrs prior to test, another tablet 7 hours prior, and last dose 1 hour prior.   traZODone (DESYREL) 50 MG tablet Take 1 tablet (50 mg total) by mouth at bedtime as needed. for sleep    Allergies:   Brilinta [ticagrelor], Chlorhexidine gluconate, Contrast media [iodinated contrast media], Statins, and Zetia [ezetimibe]   Social History   Socioeconomic History   Marital status: Married    Spouse name: Not on file   Number of children:  1   Years of education: Not on file   Highest education level: Bachelor's degree (e.g., BA, AB, BS)  Occupational History   Not on file  Tobacco Use   Smoking status: Former    Current packs/day: 0.00    Average packs/day: 1.3 packs/day for 35.0 years (43.8 ttl pk-yrs)    Types: Cigarettes    Start date: 02/28/1970    Quit date: 02/28/2005    Years since quitting: 17.8    Passive exposure: Never   Smokeless tobacco: Current    Types: Snuff   Tobacco comments:    occaisionally  Vaping Use   Vaping status: Never Used  Substance and Sexual Activity   Alcohol use: Yes    Alcohol/week: 10.0 standard drinks of alcohol    Types: 10 Standard drinks or equivalent per week    Comment: wine/liquor weekly   Drug use: No   Sexual activity: Yes  Other Topics Concern   Not on file  Social History Narrative   Not on file   Social Determinants of Health   Financial Resource Strain: Low Risk  (12/06/2022)   Overall Financial Resource Strain (CARDIA)    Difficulty of Paying Living Expenses: Not hard at all  Food Insecurity: No Food Insecurity (12/06/2022)   Hunger Vital Sign    Worried About Running Out of Food in the Last Year: Never true     Ran Out of Food in the Last Year: Never true  Transportation Needs: No Transportation Needs (12/06/2022)   PRAPARE - Administrator, Civil Service (Medical): No    Lack of Transportation (Non-Medical): No  Physical Activity: Insufficiently Active (12/06/2022)   Exercise Vital Sign    Days of Exercise per Week: 3 days    Minutes of Exercise per Session: 30 min  Stress: No Stress Concern Present (12/06/2022)   [employer] of Occupational Health - Occupational Stress Questionnaire    Feeling of Stress : Not at all  Social Connections: Moderately Integrated (12/06/2022)   Social Connection and Isolation Panel [NHANES]    Frequency of Communication with Friends and Family: More than three times a week    Frequency of Social Gatherings with Friends and Family: Not on file    Attends Religious Services: Never    Database administrator or Organizations: Yes    Attends Engineer, structural: More than 4 times per year    Marital Status: Married     Family History:  The patient's family history includes Cancer (age of onset: 30) in his father; Coronary artery disease in his mother; Diabetes in his mother; Heart attack in his father and mother; Heart disease in his father and mother; Hypertension in his father and mother; Stroke in his father. There is no history of Colon cancer, Colon polyps, Esophageal cancer, Rectal cancer, or Stomach cancer.  ROS:   12-point review of systems is negative unless otherwise noted in the HPI.   EKGs/Labs/Other Studies Reviewed:    Studies reviewed were summarized above. The additional studies were reviewed today:  2D echo 12/13/2022: 1. Left ventricular ejection fraction, by estimation, is 40 to 45%. Left  ventricular ejection fraction by 3D volume is 43 %. The left ventricle has  mildly decreased function. The left ventricle demonstrates mild global  hypokinesis with moderate inferior   wall hypokinesis. The left ventricular internal  cavity size was mildly  dilated. Left ventricular diastolic parameters are consistent with Grade I  diastolic dysfunction (impaired relaxation). The average  left ventricular  global longitudinal strain is  -9.5 %.   2. Right ventricular systolic function is normal. The right ventricular  size is normal.   3. Left atrial size was mildly dilated.   4. The mitral valve is normal in structure. Mild mitral valve  regurgitation. No evidence of mitral stenosis.   5. The aortic valve is tricuspid. Aortic valve regurgitation is mild. No  aortic stenosis is present.   6. There is mild dilatation of the ascending aorta, measuring 42 mm.   7. The inferior vena cava is normal in size with greater than 50%  respiratory variability, suggesting right atrial pressure of 3 mmHg.  __________  2D echo 08/10/2020: 1. Left ventricular ejection fraction, by estimation, is 50 to 55%. The  left ventricle has low normal function. The left ventricle has no regional  wall motion abnormalities. The left ventricular internal cavity size was  mildly dilated. Left ventricular  diastolic parameters are indeterminate. The average left ventricular  global longitudinal strain is -14.0 %. The global longitudinal strain is  abnormal.   2. Right ventricular systolic function is normal. The right ventricular  size is normal. Tricuspid regurgitation signal is inadequate for assessing  PA pressure.   3. Left atrial size was mildly dilated.   4. The mitral valve is normal in structure. Mild mitral valve  regurgitation. No evidence of mitral stenosis.   5. The aortic valve is normal in structure. Aortic valve regurgitation is  trivial. Mild aortic valve sclerosis is present, with no evidence of  aortic valve stenosis.  __________  2D echo 03/02/2020: 1. Left ventricular ejection fraction, by estimation, is 30 to 35%. The  left ventricle has moderately decreased function. The left ventricle  demonstrates global hypokinesis  with septal-lateral dyssynchrony due to  LBBB. There is mild left ventricular  hypertrophy. Left ventricular diastolic parameters are consistent with  Grade I diastolic dysfunction (impaired relaxation).   2. The mitral valve is normal in structure. Trivial mitral valve  regurgitation. No evidence of mitral stenosis.   3. The aortic valve is tricuspid. Aortic valve regurgitation is trivial.  No aortic stenosis is present.   4. Right ventricular systolic function is mildly reduced. The right  ventricular size is normal. Tricuspid regurgitation signal is inadequate  for assessing PA pressure.   5. The inferior vena cava is normal in size with greater than 50%  respiratory variability, suggesting right atrial pressure of 3 mmHg.  __________  Intraoperative TEE 11/15/2019: POST-OP IMPRESSIONS  - Left Ventricle: The left ventricle is unchanged from pre-bypass.  - Aorta: The aorta appears unchanged from pre-bypass.  - Left Atrial Appendage: The left atrial appendage appears unchanged from  pre-bypass.  - Aortic Valve: The aortic valve appears unchanged from pre-bypass.  - Mitral Valve: The mitral valve appears unchanged from pre-bypass.  - Tricuspid Valve: The tricuspid valve appears unchanged from pre-bypass.  - Interatrial Septum: The interatrial septum appears unchanged from  pre-bypass.  - Pericardium: The pericardium appears unchanged from pre-bypass.  Impella 5.5 appropriately positioned ~4.5cm into LV. Removal under TEE  guidance  without complication. No new or worsening valvular or wall motion  abnormalities.  Patient remains with severe LV dilation with global hypokinesis. Mild  secondary  MR present unchanged from prior scan.  ___________  Limited echo 11/14/2019: 1. Left ventricular ejection fraction, by estimation, is <20%. The left  ventricle has severely decreased function. The left ventricle demonstrates  global hypokinesis. Impella catheter in LV, positioned at 4.8 cm.  2. Right ventricular systolic function is moderately reduced. The right  ventricular size is normal.   3. Limited echo for Impella position.  __________  Limited echo 11/12/2019: 1. 2D echo done for Impella placement. LV function severely reduced.  Catheter tip is 4.8cm from AV.  __________  TEE 11/11/2019:    Left ventricle: Normal wall thickness. Cavity is severely dilated. Wall motion is abnormal.   Left atrium: Left atrial appendage filling and emptying velocities are decreased.   Aortic valve: Mild valve thickening present. Mild valve calcification present. No stenosis.   Mitral valve:  Mild mitral annular calcification. Mild regurgitation. The annulus is mildy.   Right ventricle:  Normal cavity size and ejection fraction.   Tricuspid valve: Trace regurgitation.  __________  2D echo 11/06/2019: 1. Poor quality images no parasternal/subcostal images.   2. Septal apical and inferior basal akinesis hypokinesis of mid and  apical inferior wall. Left ventricular ejection fraction, by estimation,  is 25 to 30%. The left ventricle has severely decreased function. The left  ventricle demonstrates regional wall  motion abnormalities (see scoring diagram/findings for description). The  left ventricular internal cavity size was severely dilated. Left  ventricular diastolic parameters were normal.   3. Right ventricular systolic function is normal. The right ventricular  size is normal.   4. The mitral valve is normal in structure. Mild mitral valve  regurgitation. No evidence of mitral stenosis.   5. The aortic valve was not well visualized. Aortic valve regurgitation  is not visualized. Mild aortic valve sclerosis is present, with no  evidence of aortic valve stenosis.   6. The inferior vena cava is normal in size with greater than 50%  respiratory variability, suggesting right atrial pressure of 3 mmHg.  __________  [facility] 11/05/2019: Conclusions: Multivessel CAD, including  calcified 80% distal LMCA stenosis, small diffusely diseased distal LAD, chronically occluded OM1, and 50% mid RCA lesion. Widely patent mid LCx stent. Moderately elevated left heart filling pressure. Mildly to moderately elevated right heart filling pressure. Normal to mildly reduced cardiac output/index.   Recommendations: Transfer to [facility] for cardiac surgery consultation for CABG, given severe LMCA disease and low LVEF. Hold clopidogrel pending cardiac surgery consultation. Obtain echocardiogram. Aggressive secondary prevention. __________  See CV studies in Epic for more remote imaging   EKG:  EKG is ordered today.  The EKG ordered today demonstrates paced rhythm 74 bpm  Recent Labs: 03/22/2022: ALT 21; BUN 20; Creatinine, Ser 1.00; Hemoglobin 14.1; Platelets 200; Potassium 5.0; Sodium 140; TSH 2.510  Recent Lipid Panel    Component Value Date/Time   CHOL 135 03/22/2022 0839   TRIG 120 03/22/2022 0839   HDL 55 03/22/2022 0839   CHOLHDL 2.5 03/22/2022 0839   CHOLHDL 2.3 01/07/2020 1601   VLDL 32 01/07/2020 1601   LDLCALC 59 03/22/2022 0839    PHYSICAL EXAM:    VS:  BP 108/70 (BP Location: Left Arm, Patient Position: Sitting, Cuff Size: Normal)   Pulse 74   Ht 5\' 6"  (1.676 m)   Wt 185 lb 6.4 oz (84.1 kg)   SpO2 92%   BMI 29.92 kg/m   BMI: Body mass index is 29.92 kg/m.  Physical Exam Vitals reviewed.  Constitutional:      Appearance: He is well-developed.  HENT:     Head: Normocephalic and atraumatic.  Eyes:     General:        Right eye: No discharge.        Left eye: No discharge.  Neck:     Vascular: No JVD.  Cardiovascular:     Rate and Rhythm: Normal rate and regular rhythm.     Heart sounds: Normal heart sounds, S1 normal and S2 normal. Heart sounds not distant. No midsystolic click and no opening snap. No murmur heard.    No friction rub.  Pulmonary:     Effort: Pulmonary effort is normal. No respiratory distress.     Breath sounds: Normal  breath sounds. No decreased breath sounds, wheezing, rhonchi or rales.  Chest:     Chest wall: No tenderness.  Abdominal:     General: There is no distension.  Musculoskeletal:     Cervical back: Normal range of motion.     Right lower leg: No edema.     Left lower leg: No edema.  Skin:    General: Skin is warm and dry.     Nails: There is no clubbing.  Neurological:     Mental Status: He is alert and oriented to person, place, and time.  Psychiatric:        Speech: Speech normal.        Behavior: Behavior normal.        Thought Content: Thought content normal.        Judgment: Judgment normal.     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  However, echo earlier this week did demonstrate a recurrence of his cardiomyopathy with an EF of 40 to 45% (previously 50 to 55%), as well as inferior wall hypokinesis.  To further evaluate his cardiomyopathy, and potential PVC burden, we will perform R/LHC.  Contrast allergy prophylaxis was discussed with the patient and sent in today.  Aggressive secondary prevention and risk factor modification is recommended with continuation of aspirin, carvedilol, Praluent, and as needed SL NTG.  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  With recurrence of cardiomyopathy, we did discuss escalation of GDMT with addition of ARB versus ARNI, however have elected to defer this until after cardiac cath given high normal potassium and prior issues with hyperkalemia (was on the spironolactone and Entresto at that time).  For now, remains on carvedilol 12.5 mg twice daily.  Pending cardiac catheter results would look to further escalate GDMT as  able.  Not currently requiring standing loop diuretic.  It is unclear at this time if his recurrence of cardiomyopathy is in the context of PVC burden versus ischemic in etiology versus mixed etiology.  PAF: Paced rhythm.  Less than 1% A-fib burden on most recent device interrogation.  CHA2DS2-VASc at least 4 (CHF, HTN, age x 1, vascular disease).  Currently not on [facility], will defer management to EP as they are device.  Remains on carvedilol.  Patient aware.  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  Remains on mexiletine and carvedilol.  Reports increase in fatigue following initiation of mexiletine.  Management per EP.  HTN: Blood pressure is well-controlled in the office today with initial BP 108/70 and recheck BP 114/62.  Remains on carvedilol as above.  HLD with statin intolerance: LDL 59.  Remains on Praluent.  PAD/carotid artery disease: No symptoms of  lifestyle limiting claudication.  Remains on aspirin and PCSK9 inhibitor.  Followed by vascular surgery.   Informed Consent   Shared Decision Making/Informed Consent{  The risks [stroke (1 in 1000), death (1 in 1000), kidney failure [usually temporary] (1 in 500), bleeding (1 in 200), allergic reaction [possibly serious] (1 in 200)], benefits (diagnostic support and management of coronary artery disease) and alternatives of a cardiac catheterization were discussed in detail with [name] and he is willing to proceed.        Disposition: F/u with Dr. Okey Dupre or an APP, as well as EP after [facility].   Medication Adjustments/Labs and Tests Ordered: Current medicines are reviewed at length with the patient today.  Concerns regarding medicines are outlined above. Medication changes, Labs and Tests ordered today are summarized above and listed in the Patient Instructions accessible in Encounters.   Signed, Eula Listen, PA-C 12/15/2022 10:03 AM     [facility] [place] [address] [place], [place] [address] [phone]

## 2022-12-14 NOTE — Telephone Encounter (Signed)
Left voicemail to schedule appointment with Sherie Don, NP

## 2022-12-14 NOTE — Telephone Encounter (Signed)
-----   Message from [place] sent at 12/14/2022  7:55 AM EDT ----- Regarding: EP appt I was running very behind yesterday and patietn did not want to wait. Please call him to reschedule EP follow-up for his device, ok to schedule with me or Dr. Lalla Brothers

## 2022-12-14 NOTE — H&P (View-Only) (Signed)
Cardiology Office Note    Date:  12/15/2022   ID:  [name], DOB [birth year], MRN [id number]  PCP:  Ronald Milan, MD  Cardiologist:  Yvonne Kendall, MD  Electrophysiologist:  Lanier Prude, MD   Chief Complaint: Follow up  History of Present Illness:   [name] is a 72 y.o. 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  History of CAD with NSTEMI in 2017 s/p PCI to the LCx. He underwent repeat LHC in 10/2017 with widely patent LCx without significant restenosis and otherwise mild to moderate CAD.  EF 40-45%. Echo showed EF 45-50%. Repeat limited echo in 01/2018 showed an EF of 40-45%. Medical management advised. He was seen in clinic in 09/2019 with DOE and abnormal EKG. Subsequent LHC on 11/05/2019 showed multivessel CAD with development of severe distal LMCA disease. Repeat echo revealed EF 25-30%, LV severely dilated, and mild MR. He was transferred to [facility] for and underwent 4-vessel CABG (LIMA to LAD, SVG to OM1, SVG to OM 2, SVG to PDA) with placement of Impella 5.5 on 11/11/2019 (removed on postoperative day 4). He did have postoperative Afib with sinus rhythm restored once started on IV amiodarone and transition to oral amiodarone and apixaban at discharge.  Amiodarone and digoxin previously discontinued due to nausea and supratherapeutic medication level, respectively.  Repeat echo in  02/2020 showed an EF of 30 to 35%, goal hypokinesis with septal lateral dyssynchrony in the setting of LBBB, reduced RV systolic function, mild LVH, and grade 1 diastolic dysfunction.  Given his persistently low EF in the setting of chronic LBBB he underwent CRT-D placement in 2022.  GDMT is also previously been limited by hyperkalemia and concern that his GDMT was making him feel worse than he did before CABG.  Following CRT-D placement, repeat echo in 07/2020 showed low normal LV systolic function with an EF of 50 to 55%, no regional wall motion abnormalities, mildly dilated LV internal cavity size, normal RV systolic function and ventricular cavity size, mildly dilated left atrium, mild mitral regurgitation, and mild aortic valve sclerosis without evidence of stenosis.  With regards to his CRT-D, EP notes he historically has low BiV pacing due to PVCs leading to reinitiation of amiodarone, though this was ultimately discontinued due to off target effects reporting he generally did not feel well on the medication.  In this setting, he was initiated on mexiletine in 11/2021 in an effort to suppress PVCs.  Device interrogation in 05/2022 with 86% BiV pacing.  In this setting, echo was recommended to assess EF with PVC burden.  Most recent device interrogation from 08/2022 showed a less than 1% A-fib burden with 82% BiV pacing.  Echo on 12/13/2022 at showed an EF of 40 to 45%, mild global hypokinesis with moderate inferior wall hypokinesis, mildly dilated LV internal cavity size, grade 1 diastolic dysfunction, normal RV systolic function and ventricular cavity size, mildly dilated left atrium, mild  mitral regurgitation, mild aortic insufficiency, and mild dilatation of the ascending aorta measuring 42 mm (aortic valve documented to be tricuspid).  With regards to his PAD and carotid artery disease, most recent ABI from 11/2022 were normal bilaterally with decreased TBI.  LE 8 showed patent left femoral below-knee  popliteal artery bypass graft without evidence of hemodynamically significant stenosis.  Carotid artery ultrasound in 11/2022 showed 1 to 39% right-sided ICA stenosis and 40 to 59% left ICA stenosis, which was improved from study dated 04/2022.  He comes in today and is without symptoms of angina or cardiac decompensation.  He does continue to note ongoing generalized malaise and fatigue.  He indicates that these symptoms are more noticeable following the initiation of mexiletine.  No lower extremity swelling or progressive orthopnea.  Weight remains stable.  No dizziness, presyncope, or syncope.  No falls or symptoms concerning for bleeding.   Labs independently reviewed: 02/2022 - TSH normal, TC 135, TG 120, HDL 55, LDL 59, BUN 20, serum creatinine 1.0, potassium 5.0, albumin 4.4, AST/ALT normal, Hgb 14.1, PLT 200 10/2019 - A1c 5.6  Past Medical History:  Diagnosis Date   Abnormal nuclear cardiac imaging test 08/08/2015   AICD (automatic cardioverter/defibrillator) present    pacemaker/defib [employer]   Arthritis    fingers   Carotid artery occlusion    CHF (congestive heart failure) (HCC)    Coronary atherosclerosis of native coronary artery 01/29/2013   11/05/19 R/LHC 80% dLMCA stenosis small diffusely dz dLAD, chronically occluded OM1 50%mRCA lesion, widely patent mLCx strent, moderately elevated L heart filling pressures, mild to moderate RH filling pressures, normal to moderately reduced CO   Duodenal erosion    Encounter for screening for lung cancer 07/13/2016   Esophageal stenosis    esophageal dilation   GERD (gastroesophageal reflux disease)    H. 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 Cardiac catheterization-nonobstructive 60% RCA disease-subsequent nuclear stress test-9 minutes, low  risk, mild inferior wall hypokinesis    Pain in limb 12/19/2017   Peripheral vascular disease (HCC)    Unstable angina (HCC) 11/25/2017    Past Surgical History:  Procedure Laterality Date   ABDOMINAL AORTOGRAM W/LOWER EXTREMITY Left 01/24/2022   Procedure: ABDOMINAL AORTOGRAM W/LOWER EXTREMITY;  Surgeon: Maeola Harman, MD;  Location: [facility] INVASIVE CV LAB;  Service: Cardiovascular;  Laterality: Left;   APPENDECTOMY     BACK SURGERY     BIV ICD INSERTION CRT-D N/A 03/23/2020   Procedure: BIV ICD INSERTION CRT-D;  Surgeon: Lanier Prude, MD;  Location: [facility] INVASIVE CV LAB;  Service: Cardiovascular;  Laterality: N/A;   CARDIAC CATHETERIZATION N/A 08/07/2015   Procedure: Left Heart Cath and Coronary Angiography;  Surgeon: Jake Bathe, MD;  Location: [facility] INVASIVE CV LAB;  Service: Cardiovascular;  Laterality: N/A;   CARDIAC CATHETERIZATION N/A 08/07/2015   Procedure: Coronary Stent Intervention;  Surgeon: Jake Bathe, MD;  Location: [facility] INVASIVE CV LAB;  Service: Cardiovascular;  Laterality: N/A;   CARDIAC CATHETERIZATION N/A 08/07/2015   Procedure: Coronary Stent Intervention;  Surgeon: Peter M Swaziland, MD;  Location: [facility] INVASIVE CV LAB;  Service: Cardiovascular;  Laterality: N/A;   CAROTID ENDARTERECTOMY  01/05/2006   Right  CEA with DPA   CATARACT EXTRACTION W/ INTRAOCULAR LENS IMPLANT Left  12/04/2017   CATARACT EXTRACTION W/PHACO Left 12/04/2017   Procedure: CATARACT EXTRACTION PHACO AND INTRAOCULAR LENS PLACEMENT (IOC) LEFT;  Surgeon: Nevada Crane, MD;  Location: [facility] SURGERY CNTR;  Service: Ophthalmology;  Laterality: Left;   CATARACT EXTRACTION W/PHACO Right 02/06/2018   Procedure: CATARACT EXTRACTION PHACO AND INTRAOCULAR LENS PLACEMENT (IOC)RIGHT;  Surgeon: Nevada Crane, MD;  Location: [facility] SURGERY [facility]TR;  Service: Ophthalmology;  Laterality: Right;   COLONOSCOPY  05/20/2008   COLONOSCOPY WITH PROPOFOL N/A 09/13/2018   Procedure: COLONOSCOPY WITH BIOPSY;   Surgeon: Midge Minium, MD;  Location: [facility] SURGERY CNTR;  Service: Endoscopy;  Laterality: N/A;   CORONARY ARTERY BYPASS GRAFT N/A 11/11/2019   Procedure: CORONARY ARTERY BYPASS GRAFTING (CABG) USING LIMA to Diag1; ENDOSCOPICALLY HARVESTED RIGHT GREATER SAPHENOUS VEIN: SVG to OM1; SVG to OM2; SVG to PDA.;  Surgeon: Kerin Perna, MD;  Location: [facility] OR;  Service: Open Heart Surgery;  Laterality: N/A;   CORONARY STENT PLACEMENT  08/07/2015   MID CIRCUMFLEX   ENDARTERECTOMY FEMORAL Bilateral 09/30/2020   Procedure: ENDARTERECTOMY FEMORAL;  Surgeon: Annice Needy, MD;  Location: [facility] ORS;  Service: Vascular;  Laterality: Bilateral;   ENDARTERECTOMY POPLITEAL Left 01/25/2022   Procedure: ENDARTERECTOMY POPLITEAL;  Surgeon: Maeola Harman, MD;  Location: [facility] OR;  Service: Vascular;  Laterality: Left;   ENDOVEIN HARVEST OF GREATER SAPHENOUS VEIN Right 11/11/2019   Procedure: ENDOVEIN HARVEST OF GREATER SAPHENOUS VEIN;  Surgeon: Kerin Perna, MD;  Location: [facility] OR;  Service: Open Heart Surgery;  Laterality: Right;   ESOPHAGOGASTRODUODENOSCOPY (EGD) WITH PROPOFOL N/A 02/11/2019   Procedure: ESOPHAGOGASTRODUODENOSCOPY (EGD) WITH BIOPSY and  Dilation;  Surgeon: Midge Minium, MD;  Location: [facility] SURGERY CNTR;  Service: Endoscopy;  Laterality: N/A;   ESOPHAGOGASTRODUODENOSCOPY (EGD) WITH PROPOFOL N/A 12/23/2021   Procedure: ESOPHAGOGASTRODUODENOSCOPY (EGD) WITH PROPOFOL;  Surgeon: Midge Minium, MD;  Location: [facility] ENDOSCOPY;  Service: Endoscopy;  Laterality: N/A;   FEMORAL-FEMORAL BYPASS GRAFT Left 01/25/2022   Procedure: left Femoral to below the knee Popliteal bypass.;  Surgeon: Maeola Harman, MD;  Location: [facility] OR;  Service: Vascular;  Laterality: Left;   HIP SURGERY Left 10/2016   left hip tendon repair   LEFT HEART CATH AND CORONARY ANGIOGRAPHY N/A 11/27/2017   Procedure: LEFT HEART CATH AND CORONARY ANGIOGRAPHY;  Surgeon: Iran Ouch, MD;  Location: [facility] INVASIVE CV LAB;   Service: Cardiovascular;  Laterality: N/A;   LOWER EXTREMITY ANGIOGRAPHY Left 02/12/2018   Procedure: LOWER EXTREMITY ANGIOGRAPHY;  Surgeon: Annice Needy, MD;  Location: [facility] INVASIVE CV LAB;  Service: Cardiovascular;  Laterality: Left;   LOWER EXTREMITY ANGIOGRAPHY Left 03/07/2018   Procedure: LOWER EXTREMITY ANGIOGRAPHY;  Surgeon: Annice Needy, MD;  Location: [facility] INVASIVE CV LAB;  Service: Cardiovascular;  Laterality: Left;   LOWER EXTREMITY ANGIOGRAPHY Left 06/04/2018   Procedure: LOWER EXTREMITY ANGIOGRAPHY;  Surgeon: Annice Needy, MD;  Location: [facility] INVASIVE CV LAB;  Service: Cardiovascular;  Laterality: Left;   LOWER EXTREMITY ANGIOGRAPHY Left 09/17/2020   Procedure: LOWER EXTREMITY ANGIOGRAPHY;  Surgeon: Annice Needy, MD;  Location: [facility] INVASIVE CV LAB;  Service: Cardiovascular;  Laterality: Left;   LOWER EXTREMITY ANGIOGRAPHY Left 09/28/2020   Procedure: LOWER EXTREMITY ANGIOGRAPHY;  Surgeon: Annice Needy, MD;  Location: [facility] INVASIVE CV LAB;  Service: Cardiovascular;  Laterality: Left;   LOWER EXTREMITY ANGIOGRAPHY N/A 02/15/2021   Procedure: LOWER EXTREMITY ANGIOGRAPHY;  Surgeon: Annice Needy, MD;  Location: [facility] INVASIVE CV LAB;  Service: Cardiovascular;  Laterality: N/A;   LOWER EXTREMITY  ANGIOGRAPHY Left 12/27/2021   Procedure: Lower Extremity Angiography;  Surgeon: Annice Needy, MD;  Location: [facility] INVASIVE CV LAB;  Service: Cardiovascular;  Laterality: Left;   LOWER EXTREMITY ANGIOGRAPHY Left 12/28/2021   Procedure: Lower Extremity Angiography;  Surgeon: Annice Needy, MD;  Location: [facility] INVASIVE CV LAB;  Service: Cardiovascular;  Laterality: Left;   PLACEMENT OF IMPELLA LEFT VENTRICULAR ASSIST DEVICE N/A 11/11/2019   Procedure: PLACEMENT OF IMPELLA LEFT VENTRICULAR ASSIST DEVICE 5.5;  Surgeon: Kerin Perna, MD;  Location: [facility] OR;  Service: Open Heart Surgery;  Laterality: N/A;  Midline Sternotomy   POLYPECTOMY N/A 09/13/2018   Procedure: POLYPECTOMY;  Surgeon: Midge Minium, MD;   Location: [facility] SURGERY CNTR;  Service: Endoscopy;  Laterality: N/A;   POLYPECTOMY N/A 02/11/2019   Procedure: POLYPECTOMY;  Surgeon: Midge Minium, MD;  Location: [facility] SURGERY CNTR;  Service: Endoscopy;  Laterality: N/A;   REMOVAL OF IMPELLA LEFT VENTRICULAR ASSIST DEVICE N/A 11/15/2019   Procedure: REMOVAL OF IMPELLA 5.5 LEFT VENTRICULAR ASSIST DEVICE;  Surgeon: Kerin Perna, MD;  Location: [facility] OR;  Service: Open Heart Surgery;  Laterality: N/A;   RIGHT/LEFT HEART CATH AND CORONARY ANGIOGRAPHY N/A 11/05/2019   Procedure: RIGHT/LEFT HEART CATH AND CORONARY ANGIOGRAPHY;  Surgeon: Yvonne Kendall, MD;  Location: [facility] INVASIVE CV LAB;  Service: Cardiovascular;  Laterality: N/A;   SPINE SURGERY     TEE WITHOUT CARDIOVERSION N/A 11/11/2019   Procedure: TRANSESOPHAGEAL ECHOCARDIOGRAM (TEE);  Surgeon: Donata Clay, Theron Arista, MD;  Location: [facility] OR;  Service: Open Heart Surgery;  Laterality: N/A;   TEE WITHOUT CARDIOVERSION N/A 11/15/2019   Procedure: TRANSESOPHAGEAL ECHOCARDIOGRAM (TEE);  Surgeon: Donata Clay, Theron Arista, MD;  Location: [facility] OR;  Service: Open Heart Surgery;  Laterality: N/A;   TONSILLECTOMY      Current Medications: Current Meds  Medication Sig   Alirocumab (PRALUENT) 150 MG/ML SOAJ INJECT 1 ML (150 MG TOTAL) INTO THE SKIN EVERY 14 (FOURTEEN) DAYS.   aspirin EC 81 MG tablet Take 1 tablet (81 mg total) by mouth daily.   carvedilol (COREG) 12.5 MG tablet TAKE (1) TABLET BY MOUTH TWICE DAILY.   colchicine 0.6 MG tablet TAKE 1 TABLET BY MOUTH 2 TIMES DAILY. (Patient taking differently: Take 0.6 mg by mouth 2 (two) times daily as needed (gout attacks).)   diphenhydrAMINE (BENADRYL) 50 MG tablet Take 1 tablet (50 mg total) by mouth once for 1 dose. 1 hour prior to test   methocarbamol (ROBAXIN) 500 MG tablet Take 500 mg by mouth. 1 po q occasionally as needed for pain/spasm. No more than one every few days.   mexiletine (MEXITIL) 150 MG capsule Take 1 capsule (150 mg total) by mouth 2 (two) times  daily.   nitroGLYCERIN (NITROSTAT) 0.4 MG SL tablet Place 1 tablet (0.4 mg total) under the tongue every 5 (five) minutes as needed for chest pain.   oxyCODONE-acetaminophen (PERCOCET) 5-325 MG tablet Take 1 tablet by mouth every 8 (eight) hours as needed.   predniSONE (DELTASONE) 50 MG tablet Take 1 tablet by mouth 13 hrs prior to test, another tablet 7 hours prior, and last dose 1 hour prior.   traZODone (DESYREL) 50 MG tablet Take 1 tablet (50 mg total) by mouth at bedtime as needed. for sleep    Allergies:   Brilinta [ticagrelor], Chlorhexidine gluconate, Contrast media [iodinated contrast media], Statins, and Zetia [ezetimibe]   Social History   Socioeconomic History   Marital status: Married    Spouse name: Not on file   Number of children:  1   Years of education: Not on file   Highest education level: Bachelor's degree (e.g., BA, AB, BS)  Occupational History   Not on file  Tobacco Use   Smoking status: Former    Current packs/day: 0.00    Average packs/day: 1.3 packs/day for 35.0 years (43.8 ttl pk-yrs)    Types: Cigarettes    Start date: 02/28/1970    Quit date: 02/28/2005    Years since quitting: 17.8    Passive exposure: Never   Smokeless tobacco: Current    Types: Snuff   Tobacco comments:    occaisionally  Vaping Use   Vaping status: Never Used  Substance and Sexual Activity   Alcohol use: Yes    Alcohol/week: 10.0 standard drinks of alcohol    Types: 10 Standard drinks or equivalent per week    Comment: wine/liquor weekly   Drug use: No   Sexual activity: Yes  Other Topics Concern   Not on file  Social History Narrative   Not on file   Social Determinants of Health   Financial Resource Strain: Low Risk  (12/06/2022)   Overall Financial Resource Strain (CARDIA)    Difficulty of Paying Living Expenses: Not hard at all  Food Insecurity: No Food Insecurity (12/06/2022)   Hunger Vital Sign    Worried About Running Out of Food in the Last Year: Never true     Ran Out of Food in the Last Year: Never true  Transportation Needs: No Transportation Needs (12/06/2022)   PRAPARE - Administrator, Civil Service (Medical): No    Lack of Transportation (Non-Medical): No  Physical Activity: Insufficiently Active (12/06/2022)   Exercise Vital Sign    Days of Exercise per Week: 3 days    Minutes of Exercise per Session: 30 min  Stress: No Stress Concern Present (12/06/2022)   [employer] of Occupational Health - Occupational Stress Questionnaire    Feeling of Stress : Not at all  Social Connections: Moderately Integrated (12/06/2022)   Social Connection and Isolation Panel [NHANES]    Frequency of Communication with Friends and Family: More than three times a week    Frequency of Social Gatherings with Friends and Family: Not on file    Attends Religious Services: Never    Database administrator or Organizations: Yes    Attends Engineer, structural: More than 4 times per year    Marital Status: Married     Family History:  The patient's family history includes Cancer (age of onset: 68) in his father; Coronary artery disease in his mother; Diabetes in his mother; Heart attack in his father and mother; Heart disease in his father and mother; Hypertension in his father and mother; Stroke in his father. There is no history of Colon cancer, Colon polyps, Esophageal cancer, Rectal cancer, or Stomach cancer.  ROS:   12-point review of systems is negative unless otherwise noted in the HPI.   EKGs/Labs/Other Studies Reviewed:    Studies reviewed were summarized above. The additional studies were reviewed today:  2D echo 12/13/2022: 1. Left ventricular ejection fraction, by estimation, is 40 to 45%. Left  ventricular ejection fraction by 3D volume is 43 %. The left ventricle has  mildly decreased function. The left ventricle demonstrates mild global  hypokinesis with moderate inferior   wall hypokinesis. The left ventricular internal  cavity size was mildly  dilated. Left ventricular diastolic parameters are consistent with Grade I  diastolic dysfunction (impaired relaxation). The average  left ventricular  global longitudinal strain is  -9.5 %.   2. Right ventricular systolic function is normal. The right ventricular  size is normal.   3. Left atrial size was mildly dilated.   4. The mitral valve is normal in structure. Mild mitral valve  regurgitation. No evidence of mitral stenosis.   5. The aortic valve is tricuspid. Aortic valve regurgitation is mild. No  aortic stenosis is present.   6. There is mild dilatation of the ascending aorta, measuring 42 mm.   7. The inferior vena cava is normal in size with greater than 50%  respiratory variability, suggesting right atrial pressure of 3 mmHg.  __________  2D echo 08/10/2020: 1. Left ventricular ejection fraction, by estimation, is 50 to 55%. The  left ventricle has low normal function. The left ventricle has no regional  wall motion abnormalities. The left ventricular internal cavity size was  mildly dilated. Left ventricular  diastolic parameters are indeterminate. The average left ventricular  global longitudinal strain is -14.0 %. The global longitudinal strain is  abnormal.   2. Right ventricular systolic function is normal. The right ventricular  size is normal. Tricuspid regurgitation signal is inadequate for assessing  PA pressure.   3. Left atrial size was mildly dilated.   4. The mitral valve is normal in structure. Mild mitral valve  regurgitation. No evidence of mitral stenosis.   5. The aortic valve is normal in structure. Aortic valve regurgitation is  trivial. Mild aortic valve sclerosis is present, with no evidence of  aortic valve stenosis.  __________  2D echo 03/02/2020: 1. Left ventricular ejection fraction, by estimation, is 30 to 35%. The  left ventricle has moderately decreased function. The left ventricle  demonstrates global hypokinesis  with septal-lateral dyssynchrony due to  LBBB. There is mild left ventricular  hypertrophy. Left ventricular diastolic parameters are consistent with  Grade I diastolic dysfunction (impaired relaxation).   2. The mitral valve is normal in structure. Trivial mitral valve  regurgitation. No evidence of mitral stenosis.   3. The aortic valve is tricuspid. Aortic valve regurgitation is trivial.  No aortic stenosis is present.   4. Right ventricular systolic function is mildly reduced. The right  ventricular size is normal. Tricuspid regurgitation signal is inadequate  for assessing PA pressure.   5. The inferior vena cava is normal in size with greater than 50%  respiratory variability, suggesting right atrial pressure of 3 mmHg.  __________  Intraoperative TEE 11/15/2019: POST-OP IMPRESSIONS  - Left Ventricle: The left ventricle is unchanged from pre-bypass.  - Aorta: The aorta appears unchanged from pre-bypass.  - Left Atrial Appendage: The left atrial appendage appears unchanged from  pre-bypass.  - Aortic Valve: The aortic valve appears unchanged from pre-bypass.  - Mitral Valve: The mitral valve appears unchanged from pre-bypass.  - Tricuspid Valve: The tricuspid valve appears unchanged from pre-bypass.  - Interatrial Septum: The interatrial septum appears unchanged from  pre-bypass.  - Pericardium: The pericardium appears unchanged from pre-bypass.  Impella 5.5 appropriately positioned ~4.5cm into LV. Removal under TEE  guidance  without complication. No new or worsening valvular or wall motion  abnormalities.  Patient remains with severe LV dilation with global hypokinesis. Mild  secondary  MR present unchanged from prior scan.  ___________  Limited echo 11/14/2019: 1. Left ventricular ejection fraction, by estimation, is <20%. The left  ventricle has severely decreased function. The left ventricle demonstrates  global hypokinesis. Impella catheter in LV, positioned at 4.8 cm.  2. Right ventricular systolic function is moderately reduced. The right  ventricular size is normal.   3. Limited echo for Impella position.  __________  Limited echo 11/12/2019: 1. 2D echo done for Impella placement. LV function severely reduced.  Catheter tip is 4.8cm from AV.  __________  TEE 11/11/2019:    Left ventricle: Normal wall thickness. Cavity is severely dilated. Wall motion is abnormal.   Left atrium: Left atrial appendage filling and emptying velocities are decreased.   Aortic valve: Mild valve thickening present. Mild valve calcification present. No stenosis.   Mitral valve:  Mild mitral annular calcification. Mild regurgitation. The annulus is mildy.   Right ventricle:  Normal cavity size and ejection fraction.   Tricuspid valve: Trace regurgitation.  __________  2D echo 11/06/2019: 1. Poor quality images no parasternal/subcostal images.   2. Septal apical and inferior basal akinesis hypokinesis of mid and  apical inferior wall. Left ventricular ejection fraction, by estimation,  is 25 to 30%. The left ventricle has severely decreased function. The left  ventricle demonstrates regional wall  motion abnormalities (see scoring diagram/findings for description). The  left ventricular internal cavity size was severely dilated. Left  ventricular diastolic parameters were normal.   3. Right ventricular systolic function is normal. The right ventricular  size is normal.   4. The mitral valve is normal in structure. Mild mitral valve  regurgitation. No evidence of mitral stenosis.   5. The aortic valve was not well visualized. Aortic valve regurgitation  is not visualized. Mild aortic valve sclerosis is present, with no  evidence of aortic valve stenosis.   6. The inferior vena cava is normal in size with greater than 50%  respiratory variability, suggesting right atrial pressure of 3 mmHg.  __________  [facility] 11/05/2019: Conclusions: Multivessel CAD, including  calcified 80% distal LMCA stenosis, small diffusely diseased distal LAD, chronically occluded OM1, and 50% mid RCA lesion. Widely patent mid LCx stent. Moderately elevated left heart filling pressure. Mildly to moderately elevated right heart filling pressure. Normal to mildly reduced cardiac output/index.   Recommendations: Transfer to [facility] for cardiac surgery consultation for CABG, given severe LMCA disease and low LVEF. Hold clopidogrel pending cardiac surgery consultation. Obtain echocardiogram. Aggressive secondary prevention. __________  See CV studies in Epic for more remote imaging   EKG:  EKG is ordered today.  The EKG ordered today demonstrates paced rhythm 74 bpm  Recent Labs: 03/22/2022: ALT 21; BUN 20; Creatinine, Ser 1.00; Hemoglobin 14.1; Platelets 200; Potassium 5.0; Sodium 140; TSH 2.510  Recent Lipid Panel    Component Value Date/Time   CHOL 135 03/22/2022 0839   TRIG 120 03/22/2022 0839   HDL 55 03/22/2022 0839   CHOLHDL 2.5 03/22/2022 0839   CHOLHDL 2.3 01/07/2020 1601   VLDL 32 01/07/2020 1601   LDLCALC 59 03/22/2022 0839    PHYSICAL EXAM:    VS:  BP 108/70 (BP Location: Left Arm, Patient Position: Sitting, Cuff Size: Normal)   Pulse 74   Ht 5\' 6"  (1.676 m)   Wt 185 lb 6.4 oz (84.1 kg)   SpO2 92%   BMI 29.92 kg/m   BMI: Body mass index is 29.92 kg/m.  Physical Exam Vitals reviewed.  Constitutional:      Appearance: He is well-developed.  HENT:     Head: Normocephalic and atraumatic.  Eyes:     General:        Right eye: No discharge.        Left eye: No discharge.  Neck:     Vascular: No JVD.  Cardiovascular:     Rate and Rhythm: Normal rate and regular rhythm.     Heart sounds: Normal heart sounds, S1 normal and S2 normal. Heart sounds not distant. No midsystolic click and no opening snap. No murmur heard.    No friction rub.  Pulmonary:     Effort: Pulmonary effort is normal. No respiratory distress.     Breath sounds: Normal  breath sounds. No decreased breath sounds, wheezing, rhonchi or rales.  Chest:     Chest wall: No tenderness.  Abdominal:     General: There is no distension.  Musculoskeletal:     Cervical back: Normal range of motion.     Right lower leg: No edema.     Left lower leg: No edema.  Skin:    General: Skin is warm and dry.     Nails: There is no clubbing.  Neurological:     Mental Status: He is alert and oriented to person, place, and time.  Psychiatric:        Speech: Speech normal.        Behavior: Behavior normal.        Thought Content: Thought content normal.        Judgment: Judgment normal.     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  However, echo earlier this week did demonstrate a recurrence of his cardiomyopathy with an EF of 40 to 45% (previously 50 to 55%), as well as inferior wall hypokinesis.  To further evaluate his cardiomyopathy, and potential PVC burden, we will perform R/LHC.  Contrast allergy prophylaxis was discussed with the patient and sent in today.  Aggressive secondary prevention and risk factor modification is recommended with continuation of aspirin, carvedilol, Praluent, and as needed SL NTG.  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  With recurrence of cardiomyopathy, we did discuss escalation of GDMT with addition of ARB versus ARNI, however have elected to defer this until after cardiac cath given high normal potassium and prior issues with hyperkalemia (was on the spironolactone and Entresto at that time).  For now, remains on carvedilol 12.5 mg twice daily.  Pending cardiac catheter results would look to further escalate GDMT as  able.  Not currently requiring standing loop diuretic.  It is unclear at this time if his recurrence of cardiomyopathy is in the context of PVC burden versus ischemic in etiology versus mixed etiology.  PAF: Paced rhythm.  Less than 1% A-fib burden on most recent device interrogation.  CHA2DS2-VASc at least 4 (CHF, HTN, age x 1, vascular disease).  Currently not on [facility], will defer management to EP as they are device.  Remains on carvedilol.  Patient aware.  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  Remains on mexiletine and carvedilol.  Reports increase in fatigue following initiation of mexiletine.  Management per EP.  HTN: Blood pressure is well-controlled in the office today with initial BP 108/70 and recheck BP 114/62.  Remains on carvedilol as above.  HLD with statin intolerance: LDL 59.  Remains on Praluent.  PAD/carotid artery disease: No symptoms of  lifestyle limiting claudication.  Remains on aspirin and PCSK9 inhibitor.  Followed by vascular surgery.   Informed Consent   Shared Decision Making/Informed Consent{  The risks [stroke (1 in 1000), death (1 in 1000), kidney failure [usually temporary] (1 in 500), bleeding (1 in 200), allergic reaction [possibly serious] (1 in 200)], benefits (diagnostic support and management of coronary artery disease) and alternatives of a cardiac catheterization were discussed in detail with Mr. Chino and he is willing to proceed.        Disposition: F/u with Dr. Okey Dupre or an APP, as well as EP after [facility].   Medication Adjustments/Labs and Tests Ordered: Current medicines are reviewed at length with the patient today.  Concerns regarding medicines are outlined above. Medication changes, Labs and Tests ordered today are summarized above and listed in the Patient Instructions accessible in Encounters.   Signed, Eula Listen, PA-C 12/15/2022 10:03 AM     [facility] [place] [address] [place], [place] [address] [phone]

## 2022-12-14 NOTE — Progress Notes (Signed)
Order(s) created erroneously. Erroneous order ID: [id number]  Order moved by: Reeves Forth  Order move date/time: 12/14/2022 10:45 AM  Source Patient: [id number]  Source Contact: 12/13/2022  Destination Patient: [id number]  Destination Contact: 12/14/2022

## 2022-12-15 ENCOUNTER — Telehealth (HOSPITAL_COMMUNITY): Payer: Self-pay | Admitting: Pharmacist

## 2022-12-15 ENCOUNTER — Encounter: Payer: Self-pay | Admitting: Physician Assistant

## 2022-12-15 ENCOUNTER — Telehealth (HOSPITAL_COMMUNITY): Payer: Self-pay

## 2022-12-15 ENCOUNTER — Ambulatory Visit: Payer: PPO | Attending: Physician Assistant | Admitting: Physician Assistant

## 2022-12-15 ENCOUNTER — Ambulatory Visit: Payer: PPO | Admitting: Cardiology

## 2022-12-15 VITALS — BP 108/70 | HR 74 | Ht 66.0 in | Wt 185.4 lb

## 2022-12-15 DIAGNOSIS — Z789 Other specified health status: Secondary | ICD-10-CM | POA: Diagnosis not present

## 2022-12-15 DIAGNOSIS — Z951 Presence of aortocoronary bypass graft: Secondary | ICD-10-CM | POA: Diagnosis not present

## 2022-12-15 DIAGNOSIS — I2510 Atherosclerotic heart disease of native coronary artery without angina pectoris: Secondary | ICD-10-CM | POA: Diagnosis not present

## 2022-12-15 DIAGNOSIS — I480 Paroxysmal atrial fibrillation: Secondary | ICD-10-CM | POA: Diagnosis not present

## 2022-12-15 DIAGNOSIS — I5022 Chronic systolic (congestive) heart failure: Secondary | ICD-10-CM

## 2022-12-15 DIAGNOSIS — I25118 Atherosclerotic heart disease of native coronary artery with other forms of angina pectoris: Secondary | ICD-10-CM | POA: Diagnosis not present

## 2022-12-15 DIAGNOSIS — I447 Left bundle-branch block, unspecified: Secondary | ICD-10-CM | POA: Diagnosis not present

## 2022-12-15 DIAGNOSIS — I493 Ventricular premature depolarization: Secondary | ICD-10-CM

## 2022-12-15 DIAGNOSIS — E785 Hyperlipidemia, unspecified: Secondary | ICD-10-CM | POA: Diagnosis not present

## 2022-12-15 DIAGNOSIS — I739 Peripheral vascular disease, unspecified: Secondary | ICD-10-CM | POA: Diagnosis not present

## 2022-12-15 DIAGNOSIS — I10 Essential (primary) hypertension: Secondary | ICD-10-CM

## 2022-12-15 DIAGNOSIS — Z95810 Presence of automatic (implantable) cardiac defibrillator: Secondary | ICD-10-CM

## 2022-12-15 LAB — CBC
Hematocrit: 42.8 % (ref 37.5–51.0)
Hemoglobin: 14.4 g/dL (ref 13.0–17.7)
MCH: 33.2 pg — ABNORMAL HIGH (ref 26.6–33.0)
MCHC: 33.6 g/dL (ref 31.5–35.7)
MCV: 99 fL — ABNORMAL HIGH (ref 79–97)
Platelets: 142 10*3/uL — ABNORMAL LOW (ref 150–450)
RBC: 4.34 x10E6/uL (ref 4.14–5.80)
RDW: 11.8 % (ref 11.6–15.4)
WBC: 4.4 10*3/uL (ref 3.4–10.8)

## 2022-12-15 LAB — BASIC METABOLIC PANEL
BUN/Creatinine Ratio: 26 — ABNORMAL HIGH (ref 10–24)
BUN: 25 mg/dL (ref 8–27)
CO2: 24 mmol/L (ref 20–29)
Calcium: 9.4 mg/dL (ref 8.6–10.2)
Chloride: 101 mmol/L (ref 96–106)
Creatinine, Ser: 0.97 mg/dL (ref 0.76–1.27)
Glucose: 100 mg/dL — ABNORMAL HIGH (ref 70–99)
Potassium: 4.8 mmol/L (ref 3.5–5.2)
Sodium: 140 mmol/L (ref 134–144)
eGFR: 83 mL/min/{1.73_m2} (ref >59)

## 2022-12-15 MED ORDER — PREDNISONE 50 MG PO TABS: ORAL_TABLET | ORAL | 0 refills | Status: DC

## 2022-12-15 MED ORDER — DIPHENHYDRAMINE HCL 50 MG PO TABS
50.0000 mg | ORAL_TABLET | Freq: Once | ORAL | 0 refills | Status: DC
Start: 2022-12-15 — End: 2022-12-28

## 2022-12-15 NOTE — Telephone Encounter (Signed)
Attempted to contact patient to discuss medication update from [employer], PA-C. Left voicemail to call back to discuss. Left call back number.

## 2022-12-15 NOTE — Telephone Encounter (Signed)
Received call from patient here at advanced heart failure clinic stating that he is due to update grant for entresto.   Advised patient that I did not see where this was still actively on his medication list. Patient saw PA, Eula Listen today whose note states the following. Patient did confirm that HE HAS been taking Entresto twice daily.   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  With recurrence of cardiomyopathy, we did discuss escalation of GDMT with addition of ARB versus ARNI, however have elected to defer this until after cardiac cath given high normal potassium and prior issues with hyperkalemia (was on the spironolactone and Entresto at that time).  For now, remains on carvedilol 12.5 mg twice daily.  Pending cardiac catheter results would look to further escalate GDMT as able.  Not currently requiring standing loop diuretic.  It is unclear at this time if his recurrence of cardiomyopathy is in the context of PVC burden versus ischemic in etiology versus mixed etiology.  Advised patient to please follow up with [facility] for them to advise if patient should still be taking this, if so we are happy to help with grant.   Will forward to their triage for follow up. Patient would like call back to discuss.

## 2022-12-15 NOTE — Telephone Encounter (Signed)
Patient left VM saying he had a question about Entresto and wanted a call back. Attempted to call back but he did not answer. Left VM.  Karle Plumber, PharmD, BCPS, BCCP, CPP Heart Failure Clinic Pharmacist [phone]

## 2022-12-15 NOTE — Patient Instructions (Addendum)
Medication Instructions:  No changes at this time.   *If you need a refill on your cardiac medications before your next appointment, please call your pharmacy*   Lab Work: CBC & BMP today  If you have labs (blood work) drawn today and your tests are completely normal, you will receive your results only by: MyChart Message (if you have MyChart) OR A paper copy in the mail If you have any lab test that is abnormal or we need to change your treatment, we will call you to review the results.   Testing/Procedures:  [name] DEPT OF [place]. [name] AT [place] [address] [place] [address] Dept: [phone] Loc: [phone]  [name]  12/15/2022  You are scheduled for a Cardiac Catheterization on Tuesday, October 29 with Dr. Cristal Deer End.  1. Please arrive at the [facility] Entrance of [facility], [address], [place] [address] at 08:30 am (This is 1 hour(s) prior to your procedure time).  Proceed to the Check-In Desk directly inside the entrance.  Procedure Parking: Use the entrance off of the [address] side of the hospital. Turn right upon entering and follow the driveway to parking that is directly in front of the Heart & Vascular Center. There is no valet parking available at this entrance, however there is an awning directly in front of the Heart & Vascular Center for drop off/ pick up for patients.  Special note: Every effort is made to have your procedure done on time. Please understand that emergencies sometimes delay scheduled procedures.  2. Diet: Do not eat solid foods after midnight.  The patient may have clear liquids until 5am upon the day of the procedure.  3. Labs: You will need to have blood drawn today   4. Medication instructions in preparation for your procedure:   Contrast Allergy: Yes, Please take Prednisone 50mg  by mouth at: Thirteen hours prior  to cath, Seven hours prior to cath And prior to leaving home please take last dose of Prednisone 50mg  and Benadryl 50mg  by mouth.   On the morning of your procedure, take your Aspirin 81 mg and any morning medicines NOT listed above.  You may use sips of water.  5. Plan to go home the same day, you will only stay overnight if medically necessary. 6. Bring a current list of your medications and current insurance cards. 7. You MUST have a responsible person to drive you home. 8. Someone MUST be with you the first 24 hours after you arrive home or your discharge will be delayed. 9. Please wear clothes that are easy to get on and off and wear slip-on shoes.  Thank you for allowing [place] to care for you!   -- [facility] [facility]    Follow-Up: At [facility], you and your health needs are our priority.  As part of our continuing mission to provide you with exceptional heart care, we have created designated Provider Care Teams.  These Care Teams include your primary Cardiologist (physician) and Advanced Practice Providers (APPs -  Physician Assistants and Nurse Practitioners) who all work together to provide you with the care you need, when you need it.   Your next appointment:   Follow up with Dr. Okey Dupre or Eula Listen PA-C after heart cath Follow up with Dr. Lalla Brothers or Sherie Don NP after heart cath

## 2022-12-15 NOTE — Telephone Encounter (Signed)
Thank you for the update.  It appears he was previously prescribed Entresto 49/51 mg twice daily.  Please confirm this is the current dose that he is taking.  If so, based on blood pressure today and most recent labs he should continue taking Entresto twice daily given recurrence of his cardiomyopathy along with continuation of current dose of carvedilol 12.5 mg twice daily.  Pending cath findings, vital signs moving forward, and follow-up labs we would look to further escalate GDMT as indicated and able.  It would be beneficial for the patient to bring medications he is taking to all appointments.

## 2022-12-16 NOTE — Telephone Encounter (Signed)
Called patient back, he did verify that he was taking Entresto 49-51 twice daily. I did update medication list, and we will update the HF team as well, patient states he called the foundation yesterday for the grant and they only needed an ICD code, I advised I would reach out to the HF team to help assist in the grant for the [facility].   Patient thankful for call back, advised he would keep [place] updated on any changes.   Thanks!

## 2022-12-16 NOTE — Telephone Encounter (Signed)
Pt is returning call.  

## 2022-12-18 ENCOUNTER — Other Ambulatory Visit: Payer: Self-pay | Admitting: Internal Medicine

## 2022-12-20 ENCOUNTER — Ambulatory Visit (INDEPENDENT_AMBULATORY_CARE_PROVIDER_SITE_OTHER): Payer: PPO

## 2022-12-20 DIAGNOSIS — I5022 Chronic systolic (congestive) heart failure: Secondary | ICD-10-CM

## 2022-12-21 LAB — CUP PACEART REMOTE DEVICE CHECK
Battery Remaining Longevity: 38 mo
Battery Remaining Percentage: 55 %
Battery Voltage: 2.95 V
Brady Statistic AP VP Percent: 83 %
Brady Statistic AP VS Percent: 6.5 %
Brady Statistic AS VP Percent: 3.8 %
Brady Statistic AS VS Percent: 1 %
Brady Statistic RA Percent Paced: 83 %
Date Time Interrogation Session: 20241022045158
HighPow Impedance: 61 Ohm
Implantable Lead Connection Status: 753985
Implantable Lead Connection Status: 753985
Implantable Lead Connection Status: 753985
Implantable Lead Implant Date: 20220124
Implantable Lead Implant Date: 20220124
Implantable Lead Implant Date: 20220124
Implantable Lead Location: 753858
Implantable Lead Location: 753859
Implantable Lead Location: 753860
Implantable Pulse Generator Implant Date: 20220124
Lead Channel Impedance Value: 490 Ohm
Lead Channel Impedance Value: 490 Ohm
Lead Channel Impedance Value: 580 Ohm
Lead Channel Pacing Threshold Amplitude: 0.75 V
Lead Channel Pacing Threshold Amplitude: 0.75 V
Lead Channel Pacing Threshold Amplitude: 1.875 V
Lead Channel Pacing Threshold Pulse Width: 0.5 ms
Lead Channel Pacing Threshold Pulse Width: 0.5 ms
Lead Channel Pacing Threshold Pulse Width: 0.5 ms
Lead Channel Sensing Intrinsic Amplitude: 12 mV
Lead Channel Sensing Intrinsic Amplitude: 2 mV
Lead Channel Setting Pacing Amplitude: 1.75 V
Lead Channel Setting Pacing Amplitude: 2 V
Lead Channel Setting Pacing Amplitude: 2.375
Lead Channel Setting Pacing Pulse Width: 0.5 ms
Lead Channel Setting Pacing Pulse Width: 0.5 ms
Lead Channel Setting Sensing Sensitivity: 0.5 mV
Pulse Gen Serial Number: 810017138
Zone Setting Status: 755011

## 2022-12-22 ENCOUNTER — Other Ambulatory Visit: Payer: Self-pay

## 2022-12-22 DIAGNOSIS — I6523 Occlusion and stenosis of bilateral carotid arteries: Secondary | ICD-10-CM

## 2022-12-22 DIAGNOSIS — I739 Peripheral vascular disease, unspecified: Secondary | ICD-10-CM

## 2022-12-24 ENCOUNTER — Other Ambulatory Visit: Payer: Self-pay | Admitting: Cardiology

## 2022-12-26 ENCOUNTER — Telehealth (HOSPITAL_COMMUNITY): Payer: Self-pay

## 2022-12-26 NOTE — Telephone Encounter (Signed)
Advanced Heart Failure Patient Advocate Encounter  The patient was approved for a [employer] grant that will help cover the cost of Carvedilol, Entresto.  Total amount awarded, $10,000.  Effective: 12/08/2022 - 12/07/2023.  BIN [id number] PCN [id number] [id number] ID [id number]  Pharmacy provided with approval and processing information. Patient informed via phone.  Burnell Blanks, CPhT Rx Patient Advocate Phone: [phone]

## 2022-12-27 ENCOUNTER — Observation Stay
Admission: RE | Admit: 2022-12-27 | Discharge: 2022-12-28 | Disposition: A | Discharge status: Home / Self Care | Payer: PPO | Attending: Internal Medicine | Admitting: Internal Medicine

## 2022-12-27 ENCOUNTER — Other Ambulatory Visit: Payer: Self-pay

## 2022-12-27 ENCOUNTER — Encounter: Payer: Self-pay | Admitting: Internal Medicine

## 2022-12-27 ENCOUNTER — Encounter
Admission: RE | Disposition: A | Discharge status: Home / Self Care | Payer: Self-pay | Source: Home / Self Care | Attending: Internal Medicine

## 2022-12-27 DIAGNOSIS — I25708 Atherosclerosis of coronary artery bypass graft(s), unspecified, with other forms of angina pectoris: Secondary | ICD-10-CM | POA: Diagnosis not present

## 2022-12-27 DIAGNOSIS — I25118 Atherosclerotic heart disease of native coronary artery with other forms of angina pectoris: Secondary | ICD-10-CM | POA: Diagnosis not present

## 2022-12-27 DIAGNOSIS — Z951 Presence of aortocoronary bypass graft: Secondary | ICD-10-CM | POA: Insufficient documentation

## 2022-12-27 DIAGNOSIS — I480 Paroxysmal atrial fibrillation: Secondary | ICD-10-CM | POA: Insufficient documentation

## 2022-12-27 DIAGNOSIS — Z79899 Other long term (current) drug therapy: Secondary | ICD-10-CM | POA: Insufficient documentation

## 2022-12-27 DIAGNOSIS — I110 Hypertensive heart disease with heart failure: Secondary | ICD-10-CM | POA: Diagnosis not present

## 2022-12-27 DIAGNOSIS — I5020 Unspecified systolic (congestive) heart failure: Secondary | ICD-10-CM | POA: Insufficient documentation

## 2022-12-27 DIAGNOSIS — I5022 Chronic systolic (congestive) heart failure: Secondary | ICD-10-CM | POA: Diagnosis not present

## 2022-12-27 DIAGNOSIS — I2510 Atherosclerotic heart disease of native coronary artery without angina pectoris: Principal | ICD-10-CM | POA: Insufficient documentation

## 2022-12-27 DIAGNOSIS — Z87891 Personal history of nicotine dependence: Secondary | ICD-10-CM | POA: Diagnosis not present

## 2022-12-27 DIAGNOSIS — M109 Gout, unspecified: Secondary | ICD-10-CM

## 2022-12-27 DIAGNOSIS — Z7982 Long term (current) use of aspirin: Secondary | ICD-10-CM | POA: Insufficient documentation

## 2022-12-27 DIAGNOSIS — I420 Dilated cardiomyopathy: Secondary | ICD-10-CM | POA: Diagnosis present

## 2022-12-27 HISTORY — PX: CORONARY STENT INTERVENTION: CATH118234

## 2022-12-27 HISTORY — PX: RIGHT/LEFT HEART CATH AND CORONARY/GRAFT ANGIOGRAPHY: CATH118267

## 2022-12-27 LAB — POCT I-STAT 7, (LYTES, BLD GAS, ICA,H+H)
Acid-base deficit: 1 mmol/L (ref 0.0–2.0)
Bicarbonate: 24 mmol/L (ref 20.0–28.0)
Calcium, Ion: 1.22 mmol/L (ref 1.15–1.40)
HCT: 44 % (ref 39.0–52.0)
Hemoglobin: 15 g/dL (ref 13.0–17.0)
O2 Saturation: 93 %
Potassium: 4.4 mmol/L (ref 3.5–5.1)
Sodium: 139 mmol/L (ref 135–145)
TCO2: 25 mmol/L (ref 22–32)
pCO2 arterial: 40.5 mm[Hg] (ref 32–48)
pH, Arterial: 7.38 (ref 7.35–7.45)
pO2, Arterial: 70 mm[Hg] — ABNORMAL LOW (ref 83–108)

## 2022-12-27 LAB — POCT I-STAT EG7
Acid-Base Excess: 0 mmol/L (ref 0.0–2.0)
Bicarbonate: 25.9 mmol/L (ref 20.0–28.0)
Calcium, Ion: 1.24 mmol/L (ref 1.15–1.40)
HCT: 45 % (ref 39.0–52.0)
Hemoglobin: 15.3 g/dL (ref 13.0–17.0)
O2 Saturation: 69 %
Potassium: 4.4 mmol/L (ref 3.5–5.1)
Sodium: 137 mmol/L (ref 135–145)
TCO2: 27 mmol/L (ref 22–32)
pCO2, Ven: 47.2 mm[Hg] (ref 44–60)
pH, Ven: 7.348 (ref 7.25–7.43)
pO2, Ven: 38 mm[Hg] (ref 32–45)

## 2022-12-27 LAB — POCT ACTIVATED CLOTTING TIME
Activated Clotting Time: 275 s
Activated Clotting Time: 305 s

## 2022-12-27 SURGERY — RIGHT/LEFT HEART CATH AND CORONARY/GRAFT ANGIOGRAPHY
Anesthesia: Moderate Sedation

## 2022-12-27 MED ORDER — SODIUM CHLORIDE 0.9 % IV SOLN: 250.0000 mL | INTRAVENOUS | Status: DC | PRN

## 2022-12-27 MED ORDER — HEPARIN SODIUM (PORCINE) 1000 UNIT/ML IJ SOLN
INTRAMUSCULAR | Status: AC
  Filled 2022-12-27: qty 10

## 2022-12-27 MED ORDER — IOHEXOL 300 MG/ML  SOLN
INTRAMUSCULAR | Status: DC | PRN
  Administered 2022-12-27: 127 mL

## 2022-12-27 MED ORDER — SODIUM CHLORIDE 0.9% FLUSH
10.0000 mL | Freq: Two times a day (BID) | INTRAVENOUS | Status: DC
  Administered 2022-12-27: 10 mL via INTRAVENOUS

## 2022-12-27 MED ORDER — ENOXAPARIN SODIUM 40 MG/0.4ML IJ SOSY
40.0000 mg | PREFILLED_SYRINGE | INTRAMUSCULAR | Status: DC
  Administered 2022-12-28: 40 mg via SUBCUTANEOUS
  Filled 2022-12-27 (×2): qty 0.4

## 2022-12-27 MED ORDER — SACUBITRIL-VALSARTAN 49-51 MG PO TABS
1.0000 | ORAL_TABLET | Freq: Two times a day (BID) | ORAL | Status: DC
  Administered 2022-12-27 – 2022-12-28 (×2): 1 via ORAL
  Filled 2022-12-27 (×2): qty 1

## 2022-12-27 MED ORDER — VERAPAMIL HCL 2.5 MG/ML IV SOLN
INTRAVENOUS | Status: DC | PRN
  Administered 2022-12-27 (×2): 2.5 mg via INTRA_ARTERIAL

## 2022-12-27 MED ORDER — FENTANYL CITRATE (PF) 100 MCG/2ML IJ SOLN
INTRAMUSCULAR | Status: DC | PRN
  Administered 2022-12-27 (×3): 25 ug via INTRAVENOUS

## 2022-12-27 MED ORDER — VERAPAMIL HCL 2.5 MG/ML IV SOLN
INTRAVENOUS | Status: AC
  Filled 2022-12-27: qty 2

## 2022-12-27 MED ORDER — CARVEDILOL 12.5 MG PO TABS
12.5000 mg | ORAL_TABLET | Freq: Two times a day (BID) | ORAL | Status: DC
  Administered 2022-12-27 – 2022-12-28 (×2): 12.5 mg via ORAL
  Filled 2022-12-27 (×2): qty 1

## 2022-12-27 MED ORDER — SODIUM CHLORIDE 0.9% FLUSH
3.0000 mL | Freq: Two times a day (BID) | INTRAVENOUS | Status: DC
  Administered 2022-12-27 – 2022-12-28 (×2): 3 mL via INTRAVENOUS

## 2022-12-27 MED ORDER — MEXILETINE HCL 150 MG PO CAPS
150.0000 mg | ORAL_CAPSULE | Freq: Two times a day (BID) | ORAL | Status: DC
  Administered 2022-12-27 – 2022-12-28 (×2): 150 mg via ORAL
  Filled 2022-12-27 (×2): qty 1

## 2022-12-27 MED ORDER — OXYCODONE-ACETAMINOPHEN 5-325 MG PO TABS: 1.0000 | ORAL_TABLET | Freq: Three times a day (TID) | ORAL | Status: DC | PRN

## 2022-12-27 MED ORDER — HEPARIN SODIUM (PORCINE) 1000 UNIT/ML IJ SOLN
INTRAMUSCULAR | Status: AC
Start: 2022-12-27 — End: ?
  Filled 2022-12-27: qty 10

## 2022-12-27 MED ORDER — LIDOCAINE HCL (PF) 1 % IJ SOLN
INTRAMUSCULAR | Status: DC | PRN
  Administered 2022-12-27: 2 mL

## 2022-12-27 MED ORDER — ORAL CARE MOUTH RINSE: 15.0000 mL | OROMUCOSAL | Status: DC | PRN

## 2022-12-27 MED ORDER — SODIUM CHLORIDE 0.9 % IV SOLN
INTRAVENOUS | Status: AC
Start: 2022-12-27 — End: 2022-12-27

## 2022-12-27 MED ORDER — ONDANSETRON HCL 4 MG/2ML IJ SOLN: 4.0000 mg | Freq: Four times a day (QID) | INTRAMUSCULAR | Status: DC | PRN

## 2022-12-27 MED ORDER — MIDAZOLAM HCL 2 MG/2ML IJ SOLN
INTRAMUSCULAR | Status: AC
  Filled 2022-12-27: qty 2

## 2022-12-27 MED ORDER — ASPIRIN 81 MG PO TBEC
81.0000 mg | DELAYED_RELEASE_TABLET | Freq: Every day | ORAL | Status: DC
  Administered 2022-12-28: 81 mg via ORAL
  Filled 2022-12-27: qty 1

## 2022-12-27 MED ORDER — NITROGLYCERIN 0.4 MG SL SUBL: 0.4000 mg | SUBLINGUAL_TABLET | SUBLINGUAL | Status: DC | PRN

## 2022-12-27 MED ORDER — HYDRALAZINE HCL 20 MG/ML IJ SOLN: 10.0000 mg | INTRAMUSCULAR | Status: AC | PRN

## 2022-12-27 MED ORDER — LABETALOL HCL 5 MG/ML IV SOLN: 10.0000 mg | INTRAVENOUS | Status: AC | PRN

## 2022-12-27 MED ORDER — ACETAMINOPHEN 325 MG PO TABS
650.0000 mg | ORAL_TABLET | ORAL | Status: DC | PRN
  Administered 2022-12-27: 650 mg via ORAL
  Filled 2022-12-27: qty 2

## 2022-12-27 MED ORDER — HEPARIN SODIUM (PORCINE) 1000 UNIT/ML IJ SOLN
INTRAMUSCULAR | Status: DC | PRN
  Administered 2022-12-27: 2000 [IU] via INTRAVENOUS
  Administered 2022-12-27: 4000 [IU] via INTRAVENOUS
  Administered 2022-12-27: 5000 [IU] via INTRAVENOUS

## 2022-12-27 MED ORDER — TRAZODONE HCL 50 MG PO TABS
50.0000 mg | ORAL_TABLET | Freq: Every evening | ORAL | Status: DC | PRN
  Administered 2022-12-27: 50 mg via ORAL
  Filled 2022-12-27: qty 1

## 2022-12-27 MED ORDER — CLOPIDOGREL BISULFATE 75 MG PO TABS
75.0000 mg | ORAL_TABLET | Freq: Every day | ORAL | Status: DC
  Administered 2022-12-28: 75 mg via ORAL
  Filled 2022-12-27: qty 1

## 2022-12-27 MED ORDER — SODIUM CHLORIDE 0.9% FLUSH: 3.0000 mL | INTRAVENOUS | Status: DC | PRN

## 2022-12-27 MED ORDER — LIDOCAINE HCL 1 % IJ SOLN
INTRAMUSCULAR | Status: AC
  Filled 2022-12-27: qty 20

## 2022-12-27 MED ORDER — ASPIRIN 81 MG PO CHEW: 81.0000 mg | CHEWABLE_TABLET | ORAL | Status: DC

## 2022-12-27 MED ORDER — SODIUM CHLORIDE 0.9 % IV SOLN
INTRAVENOUS | Status: DC | PRN
  Administered 2022-12-27: 75 mL/h via INTRAVENOUS

## 2022-12-27 MED ORDER — CLOPIDOGREL BISULFATE 75 MG PO TABS
ORAL_TABLET | ORAL | Status: AC
  Filled 2022-12-27: qty 8

## 2022-12-27 MED ORDER — HEPARIN (PORCINE) IN NACL 1000-0.9 UT/500ML-% IV SOLN
INTRAVENOUS | Status: AC
  Filled 2022-12-27: qty 1000

## 2022-12-27 MED ORDER — FENTANYL CITRATE (PF) 100 MCG/2ML IJ SOLN
INTRAMUSCULAR | Status: AC
  Filled 2022-12-27: qty 2

## 2022-12-27 MED ORDER — MIDAZOLAM HCL 2 MG/2ML IJ SOLN
INTRAMUSCULAR | Status: DC | PRN
  Administered 2022-12-27 (×2): 1 mg via INTRAVENOUS

## 2022-12-27 MED ORDER — HEPARIN (PORCINE) IN NACL 2000-0.9 UNIT/L-% IV SOLN
INTRAVENOUS | Status: DC | PRN
  Administered 2022-12-27: 1000 mL

## 2022-12-27 MED ORDER — NITROGLYCERIN 1 MG/10 ML FOR IR/CATH LAB
INTRA_ARTERIAL | Status: DC | PRN
  Administered 2022-12-27: 100 ug via INTRA_ARTERIAL
  Administered 2022-12-27: 200 ug via INTRA_ARTERIAL

## 2022-12-27 MED ORDER — CLOPIDOGREL BISULFATE 75 MG PO TABS
ORAL_TABLET | ORAL | Status: DC | PRN
  Administered 2022-12-27: 600 mg via ORAL

## 2022-12-27 SURGICAL SUPPLY — 25 items
BALLN TREK RX 2.5X12 (BALLOONS) ×2
BALLN ~~LOC~~ TREK NEO RX 3.5X15 (BALLOONS) ×2
BALLOON TREK RX 2.5X12 (BALLOONS) IMPLANT
BALLOON ~~LOC~~ TREK NEO RX 3.5X15 (BALLOONS) IMPLANT
CATH BALLN WEDGE 5F 110CM (CATHETERS) IMPLANT
CATH INFINITI 5 FR AL2 (CATHETERS) IMPLANT
CATH INFINITI 5 FR IM (CATHETERS) IMPLANT
CATH INFINITI 5 FR MPA2 (CATHETERS) IMPLANT
CATH INFINITI 5FR JL4 (CATHETERS) IMPLANT
CATH VISTA GUIDE 6FR AL1 (CATHETERS) IMPLANT
DEVICE RAD TR BAND REGULAR (VASCULAR PRODUCTS) IMPLANT
DRAPE BRACHIAL (DRAPES) IMPLANT
GLIDESHEATH SLEND SS 6F .021 (SHEATH) IMPLANT
GUIDEWIRE .025 260CM (WIRE) IMPLANT
GUIDEWIRE INQWIRE 1.5J.035X260 (WIRE) IMPLANT
INQWIRE 1.5J .035X260CM (WIRE) ×2
KIT ENCORE 26 ADVANTAGE (KITS) IMPLANT
KIT SYRINGE INJ CVI SPIKEX1 (MISCELLANEOUS) IMPLANT
PACK CARDIAC CATH (CUSTOM PROCEDURE TRAY) ×3 IMPLANT
PROTECTION STATION PRESSURIZED (MISCELLANEOUS) ×2
SET ATX-X65L (MISCELLANEOUS) IMPLANT
SHEATH GLIDE SLENDER 4/5FR (SHEATH) IMPLANT
STATION PROTECTION PRESSURIZED (MISCELLANEOUS) IMPLANT
STENT ONYX FRONTIER 3.0X30 (Permanent Stent) IMPLANT
WIRE RUNTHROUGH .014X180CM (WIRE) IMPLANT

## 2022-12-27 NOTE — Interval H&P Note (Signed)
History and Physical Interval Note:  12/27/2022 9:51 AM  [name]  has presented today for surgery, with the diagnosis of coronary artery disease and HFrEF.  The various methods of treatment have been discussed with the patient and family. After consideration of risks, benefits and other options for treatment, the patient has consented to  Procedure(s): RIGHT/LEFT HEART CATH AND CORONARY ANGIOGRAPHY (Bilateral) as a surgical intervention.  The patient's history has been reviewed, patient examined, no change in status, stable for surgery.  I have reviewed the patient's chart and labs.  Questions were answered to the patient's satisfaction.    Cath Lab Visit (complete for each Cath Lab visit)  Clinical Evaluation Leading to the Procedure:   ACS: No.  Non-ACS:    Anginal/Heart Failure Classification: NYHA class III  Anti-ischemic medical therapy: Minimal Therapy (1 class of medications)  Non-Invasive Test Results: No non-invasive testing performed (LVEF 40-45% by echo -> intermediate risk)  Prior CABG: Previous CABG  [name]

## 2022-12-27 NOTE — Addendum Note (Signed)
Encounter addended by: Roanna Epley on: 12/27/2022 2:59 PM  Actions taken: Imaging Exam ended

## 2022-12-28 ENCOUNTER — Other Ambulatory Visit: Payer: Self-pay | Admitting: Cardiology

## 2022-12-28 ENCOUNTER — Encounter: Payer: Self-pay | Admitting: Internal Medicine

## 2022-12-28 DIAGNOSIS — M109 Gout, unspecified: Secondary | ICD-10-CM

## 2022-12-28 DIAGNOSIS — I5020 Unspecified systolic (congestive) heart failure: Secondary | ICD-10-CM | POA: Diagnosis not present

## 2022-12-28 DIAGNOSIS — I2510 Atherosclerotic heart disease of native coronary artery without angina pectoris: Secondary | ICD-10-CM | POA: Diagnosis not present

## 2022-12-28 DIAGNOSIS — I25118 Atherosclerotic heart disease of native coronary artery with other forms of angina pectoris: Secondary | ICD-10-CM | POA: Diagnosis not present

## 2022-12-28 DIAGNOSIS — I420 Dilated cardiomyopathy: Secondary | ICD-10-CM | POA: Diagnosis not present

## 2022-12-28 LAB — CBC
HCT: 41.2 % (ref 39.0–52.0)
Hemoglobin: 13.9 g/dL (ref 13.0–17.0)
MCH: 33.3 pg (ref 26.0–34.0)
MCHC: 33.7 g/dL (ref 30.0–36.0)
MCV: 98.6 fL (ref 80.0–100.0)
Platelets: 145 10*3/uL — ABNORMAL LOW (ref 150–400)
RBC: 4.18 MIL/uL — ABNORMAL LOW (ref 4.22–5.81)
RDW: 12.8 % (ref 11.5–15.5)
WBC: 6.8 10*3/uL (ref 4.0–10.5)
nRBC: 0 % (ref 0.0–0.2)

## 2022-12-28 LAB — BASIC METABOLIC PANEL
Anion gap: 9 (ref 5–15)
BUN: 24 mg/dL — ABNORMAL HIGH (ref 8–23)
CO2: 26 mmol/L (ref 22–32)
Calcium: 8.6 mg/dL — ABNORMAL LOW (ref 8.9–10.3)
Chloride: 101 mmol/L (ref 98–111)
Creatinine, Ser: 0.89 mg/dL (ref 0.61–1.24)
GFR, Estimated: >60 mL/min (ref >60)
Glucose, Bld: 108 mg/dL — ABNORMAL HIGH (ref 70–99)
Potassium: 3.8 mmol/L (ref 3.5–5.1)
Sodium: 136 mmol/L (ref 135–145)

## 2022-12-28 MED ORDER — CLOPIDOGREL BISULFATE 75 MG PO TABS: 75.0000 mg | ORAL_TABLET | Freq: Every day | ORAL | 6 refills | Status: DC

## 2022-12-28 MED ORDER — COLCHICINE 0.6 MG PO TABS: 0.6000 mg | ORAL_TABLET | Freq: Two times a day (BID) | ORAL | 0 refills | Status: DC | PRN

## 2022-12-28 NOTE — Telephone Encounter (Signed)
Patient is requesting a 90 day supply - are you okay with that?

## 2022-12-28 NOTE — Plan of Care (Signed)

## 2022-12-28 NOTE — Discharge Summary (Signed)
Proximal Graft      51              triphasic                              +--------------------+--------+--------+---------+-----------------------------+ Mid Graft           51              triphasicgraft dilation with occluded                                               branch (possible site of                                                    previous intervention)        +--------------------+--------+--------+---------+-----------------------------+ Distal Graft        45              triphasic                              +--------------------+--------+--------+---------+-----------------------------+ Distal Anastomosis  39              triphasic                              +--------------------+--------+--------+---------+-----------------------------+ Outflow             45              triphasicturbulent                     +--------------------+--------+--------+---------+-----------------------------+   Summary: Left: Patent left femoral-below knee popliteal artery bypass graft without evidence of hemodynamically significant stenosis. Low velocities in the graft could suggest a threatened bypass.  See table(s) above for measurements and observations. Electronically signed by Lemar Livings MD on 11/30/2022 at 5:05:24 PM.    Final    VAS [place] ABI WITH/WO TBI  Result Date: 11/30/2022  LOWER EXTREMITY DOPPLER STUDY Patient Name:  [name]  Date of Exam:   11/30/2022 Medical Rec #: [id number]     Accession #:    [id number] Date of Birth: 07/16/1950    Patient Gender: M Patient Age:   72 years Exam Location:  [facility] Procedure:      VAS [place] ABI WITH/WO TBI Referring Phys: EMMA COLLINS --------------------------------------------------------------------------------  Indications: Peripheral artery disease. High Risk Factors: Hypertension, hyperlipidemia, past history of smoking, prior                    MI, coronary artery disease. Other Factors: S/P CABGx4, PVD.  Vascular Interventions: 01/25/2022 Left femoral to below knee popliteal bypass                         12/28/21- LLE angiogram, mech thrombectomy left SFA and                         pop with the penumbra CAT 7 bolt device. PTA of the left  Discharge Summary    Patient ID: [name] MRN: [id number]; DOB: Jun 11, 1950  Admit date: 12/27/2022 Discharge date: 12/28/2022  Primary Care Provider: [name], MD  Primary Cardiologist: Yvonne Kendall, MD  Primary Electrophysiologist:  Lanier Prude, MD   Discharge Diagnoses    Principal Problem:   Coronary artery disease Active Problems:   HFrEF (heart failure with reduced ejection fraction) (HCC)   Diagnostic Studies/Procedures   Coronary Stent Intervention 12/27/2022 Conclusions: Severe native coronary artery disease, including 80% distal LMCA stenosis and chronic total occlusions of D2 and OM1.  Culprit disease for the patient's recurrent atypical angina and worsening LVEF is likely sequential 50% and 90% stenoses in the proximal/mid RCA. Widely patent LIMA-D2 and SVG Y-graft to OM1 and OM4. Chronically occluded SVG-RPDA. Successful PCI to proximal/mid RCA using Onyx Frontier 3.0 x 30 mm drug-eluting stent (postdilated to 3.6 mm) with 0% residual stenosis and TIMI-3 flow.   Recommendations: Overnight observation. Dual antiplatelet therapy with aspirin and clopidogrel for at least 6 months, potentially longer.  Consider resumption of apixaban with history of PAF at the discretion of electrophysiology (upcoming visit next month).  If apixaban is restarted, would recommend discontinuation of aspirin. Aggressive secondary prevention of coronary artery disease.  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 Continue outpatient escalation of goal-directed medical therapy for heart failure with mildly reduced ejection fraction as tolerated.  Diagnostic Dominance: Right  Intervention   _____________   History of Present Illness     [name] is a 72 y.o. 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  Hospital Course     Consultants: none   Mr. Feezor was seen in clinic on 12/15/2022 with continued fatigue.  Prior echocardiogram revealed a recurrence of cardiomyopathy with an EF of 40-45% as well as an inferior wall hypokinesis.  To further evaluate his cardiomyopathy and potential PVC burden he was scheduled for a right and left heart catheterization.  Due to contrast allergy he was started on contrast allergy prophylaxis.  He was scheduled for the right and left heart catheterization as an elective procedure on 12/27/2022.  He had successful PCI to the proximal/mid RCA.  Recommended for overnight observation.  He was started on dual antiplatelet therapy with aspirin and complete agreeable 75 mg daily for at least 6 months potentially longer.  Consider resumption of apixaban with history of PAF at the discretion of electrophysiology at his upcoming appointment next month.  If apixaban is restarted will recommend the discontinuation of aspirin therapy.  He was also advised on aggressive secondary prevention of coronary artery disease.  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  He was also encouraged to continue with outpatient escalation of goal-directed medical therapy for heart failure and mildly reduced ejection fraction.  He was maintained on telemetry overnight.  Vital signs  12/13/2022 Medical Rec #:  [id number]    Height:       66.5 in Accession #:    [id number]   Weight:       183.2 lb Date of Birth:  Sep 05, 1950   BSA:          1.937 m Patient Age:    71 years     BP:           128/84 mmHg Patient Gender: M            HR:           74 bpm. Exam Location:  [facility] Procedure: 2D Echo, 3D Echo, Cardiac Doppler, Color Doppler and Intracardiac            Opacification Agent Indications:    I50.20*  Unspecified systolic (congestive) heart failure  History:        Patient has prior history of Echocardiogram examinations, most                 recent 08/10/2020. CHF, CAD and Previous Myocardial Infarction,                 Carotid Disease and PAD, Arrythmias:LBBB; Risk Factors:Former                 Smoker, Hypertension and Dyslipidemia.  Sonographer:    Quentin Ore RDMS, RVT, RDCS Referring Phys: [id number] Lanier Prude  Sonographer Comments: Global longitudinal strain was attempted. IMPRESSIONS  1. Left ventricular ejection fraction, by estimation, is 40 to 45%. Left ventricular ejection fraction by 3D volume is 43 %. The left ventricle has mildly decreased function. The left ventricle demonstrates mild global hypokinesis with moderate inferior  wall hypokinesis. The left ventricular internal cavity size was mildly dilated. Left ventricular diastolic parameters are consistent with Grade I diastolic dysfunction (impaired relaxation). The average left ventricular global longitudinal strain is -9.5 %.  2. Right ventricular systolic function is normal. The right ventricular size is normal.  3. Left atrial size was mildly dilated.  4. The mitral valve is normal in structure. Mild mitral valve regurgitation. No evidence of mitral stenosis.  5. The aortic valve is tricuspid. Aortic valve regurgitation is mild. No aortic stenosis is present.  6. There is mild dilatation of the ascending aorta, measuring 42 mm.  7. The inferior vena cava is normal in size with greater than 50% respiratory variability, suggesting right atrial pressure of 3 mmHg. FINDINGS  Left Ventricle: Left ventricular ejection fraction, by estimation, is 40 to 45%. Left ventricular ejection fraction by 3D volume is 43 %. The left ventricle has mildly decreased function. The left ventricle demonstrates global hypokinesis. Definity contrast agent was given IV to delineate the left ventricular endocardial borders. The average left ventricular global  longitudinal strain is -9.5 %. The left ventricular internal cavity size was mildly dilated. There is no left ventricular hypertrophy. Left ventricular diastolic parameters are consistent with Grade I diastolic dysfunction (impaired relaxation). Right Ventricle: The right ventricular size is normal. No increase in right ventricular wall thickness. Right ventricular systolic function is normal. Left Atrium: Left atrial size was mildly dilated. Right Atrium: Right atrial size was normal in size. Pericardium: There is no evidence of pericardial effusion. Mitral Valve: The mitral valve is normal in structure. Mild mitral valve regurgitation. No evidence of mitral valve stenosis. Tricuspid Valve: The tricuspid valve is normal in structure. Tricuspid valve regurgitation is not demonstrated. No evidence of tricuspid stenosis. Aortic Valve: The aortic valve is  12/13/2022 Medical Rec #:  [id number]    Height:       66.5 in Accession #:    [id number]   Weight:       183.2 lb Date of Birth:  Sep 05, 1950   BSA:          1.937 m Patient Age:    71 years     BP:           128/84 mmHg Patient Gender: M            HR:           74 bpm. Exam Location:  [facility] Procedure: 2D Echo, 3D Echo, Cardiac Doppler, Color Doppler and Intracardiac            Opacification Agent Indications:    I50.20*  Unspecified systolic (congestive) heart failure  History:        Patient has prior history of Echocardiogram examinations, most                 recent 08/10/2020. CHF, CAD and Previous Myocardial Infarction,                 Carotid Disease and PAD, Arrythmias:LBBB; Risk Factors:Former                 Smoker, Hypertension and Dyslipidemia.  Sonographer:    Quentin Ore RDMS, RVT, RDCS Referring Phys: [id number] Lanier Prude  Sonographer Comments: Global longitudinal strain was attempted. IMPRESSIONS  1. Left ventricular ejection fraction, by estimation, is 40 to 45%. Left ventricular ejection fraction by 3D volume is 43 %. The left ventricle has mildly decreased function. The left ventricle demonstrates mild global hypokinesis with moderate inferior  wall hypokinesis. The left ventricular internal cavity size was mildly dilated. Left ventricular diastolic parameters are consistent with Grade I diastolic dysfunction (impaired relaxation). The average left ventricular global longitudinal strain is -9.5 %.  2. Right ventricular systolic function is normal. The right ventricular size is normal.  3. Left atrial size was mildly dilated.  4. The mitral valve is normal in structure. Mild mitral valve regurgitation. No evidence of mitral stenosis.  5. The aortic valve is tricuspid. Aortic valve regurgitation is mild. No aortic stenosis is present.  6. There is mild dilatation of the ascending aorta, measuring 42 mm.  7. The inferior vena cava is normal in size with greater than 50% respiratory variability, suggesting right atrial pressure of 3 mmHg. FINDINGS  Left Ventricle: Left ventricular ejection fraction, by estimation, is 40 to 45%. Left ventricular ejection fraction by 3D volume is 43 %. The left ventricle has mildly decreased function. The left ventricle demonstrates global hypokinesis. Definity contrast agent was given IV to delineate the left ventricular endocardial borders. The average left ventricular global  longitudinal strain is -9.5 %. The left ventricular internal cavity size was mildly dilated. There is no left ventricular hypertrophy. Left ventricular diastolic parameters are consistent with Grade I diastolic dysfunction (impaired relaxation). Right Ventricle: The right ventricular size is normal. No increase in right ventricular wall thickness. Right ventricular systolic function is normal. Left Atrium: Left atrial size was mildly dilated. Right Atrium: Right atrial size was normal in size. Pericardium: There is no evidence of pericardial effusion. Mitral Valve: The mitral valve is normal in structure. Mild mitral valve regurgitation. No evidence of mitral valve stenosis. Tricuspid Valve: The tricuspid valve is normal in structure. Tricuspid valve regurgitation is not demonstrated. No evidence of tricuspid stenosis. Aortic Valve: The aortic valve is  Proximal Graft      51              triphasic                              +--------------------+--------+--------+---------+-----------------------------+ Mid Graft           51              triphasicgraft dilation with occluded                                               branch (possible site of                                                    previous intervention)        +--------------------+--------+--------+---------+-----------------------------+ Distal Graft        45              triphasic                              +--------------------+--------+--------+---------+-----------------------------+ Distal Anastomosis  39              triphasic                              +--------------------+--------+--------+---------+-----------------------------+ Outflow             45              triphasicturbulent                     +--------------------+--------+--------+---------+-----------------------------+   Summary: Left: Patent left femoral-below knee popliteal artery bypass graft without evidence of hemodynamically significant stenosis. Low velocities in the graft could suggest a threatened bypass.  See table(s) above for measurements and observations. Electronically signed by Lemar Livings MD on 11/30/2022 at 5:05:24 PM.    Final    VAS [place] ABI WITH/WO TBI  Result Date: 11/30/2022  LOWER EXTREMITY DOPPLER STUDY Patient Name:  [name]  Date of Exam:   11/30/2022 Medical Rec #: [id number]     Accession #:    [id number] Date of Birth: 07/16/1950    Patient Gender: M Patient Age:   72 years Exam Location:  [facility] Procedure:      VAS [place] ABI WITH/WO TBI Referring Phys: EMMA COLLINS --------------------------------------------------------------------------------  Indications: Peripheral artery disease. High Risk Factors: Hypertension, hyperlipidemia, past history of smoking, prior                    MI, coronary artery disease. Other Factors: S/P CABGx4, PVD.  Vascular Interventions: 01/25/2022 Left femoral to below knee popliteal bypass                         12/28/21- LLE angiogram, mech thrombectomy left SFA and                         pop with the penumbra CAT 7 bolt device. PTA of the left  Discharge Summary    Patient ID: [name] MRN: [id number]; DOB: Jun 11, 1950  Admit date: 12/27/2022 Discharge date: 12/28/2022  Primary Care Provider: [name], MD  Primary Cardiologist: Yvonne Kendall, MD  Primary Electrophysiologist:  Lanier Prude, MD   Discharge Diagnoses    Principal Problem:   Coronary artery disease Active Problems:   HFrEF (heart failure with reduced ejection fraction) (HCC)   Diagnostic Studies/Procedures   Coronary Stent Intervention 12/27/2022 Conclusions: Severe native coronary artery disease, including 80% distal LMCA stenosis and chronic total occlusions of D2 and OM1.  Culprit disease for the patient's recurrent atypical angina and worsening LVEF is likely sequential 50% and 90% stenoses in the proximal/mid RCA. Widely patent LIMA-D2 and SVG Y-graft to OM1 and OM4. Chronically occluded SVG-RPDA. Successful PCI to proximal/mid RCA using Onyx Frontier 3.0 x 30 mm drug-eluting stent (postdilated to 3.6 mm) with 0% residual stenosis and TIMI-3 flow.   Recommendations: Overnight observation. Dual antiplatelet therapy with aspirin and clopidogrel for at least 6 months, potentially longer.  Consider resumption of apixaban with history of PAF at the discretion of electrophysiology (upcoming visit next month).  If apixaban is restarted, would recommend discontinuation of aspirin. Aggressive secondary prevention of coronary artery disease.  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 Continue outpatient escalation of goal-directed medical therapy for heart failure with mildly reduced ejection fraction as tolerated.  Diagnostic Dominance: Right  Intervention   _____________   History of Present Illness     [name] is a 72 y.o. 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  Hospital Course     Consultants: none   Mr. Feezor was seen in clinic on 12/15/2022 with continued fatigue.  Prior echocardiogram revealed a recurrence of cardiomyopathy with an EF of 40-45% as well as an inferior wall hypokinesis.  To further evaluate his cardiomyopathy and potential PVC burden he was scheduled for a right and left heart catheterization.  Due to contrast allergy he was started on contrast allergy prophylaxis.  He was scheduled for the right and left heart catheterization as an elective procedure on 12/27/2022.  He had successful PCI to the proximal/mid RCA.  Recommended for overnight observation.  He was started on dual antiplatelet therapy with aspirin and complete agreeable 75 mg daily for at least 6 months potentially longer.  Consider resumption of apixaban with history of PAF at the discretion of electrophysiology at his upcoming appointment next month.  If apixaban is restarted will recommend the discontinuation of aspirin therapy.  He was also advised on aggressive secondary prevention of coronary artery disease.  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  He was also encouraged to continue with outpatient escalation of goal-directed medical therapy for heart failure and mildly reduced ejection fraction.  He was maintained on telemetry overnight.  Vital signs  12/13/2022 Medical Rec #:  [id number]    Height:       66.5 in Accession #:    [id number]   Weight:       183.2 lb Date of Birth:  Sep 05, 1950   BSA:          1.937 m Patient Age:    71 years     BP:           128/84 mmHg Patient Gender: M            HR:           74 bpm. Exam Location:  [facility] Procedure: 2D Echo, 3D Echo, Cardiac Doppler, Color Doppler and Intracardiac            Opacification Agent Indications:    I50.20*  Unspecified systolic (congestive) heart failure  History:        Patient has prior history of Echocardiogram examinations, most                 recent 08/10/2020. CHF, CAD and Previous Myocardial Infarction,                 Carotid Disease and PAD, Arrythmias:LBBB; Risk Factors:Former                 Smoker, Hypertension and Dyslipidemia.  Sonographer:    Quentin Ore RDMS, RVT, RDCS Referring Phys: [id number] Lanier Prude  Sonographer Comments: Global longitudinal strain was attempted. IMPRESSIONS  1. Left ventricular ejection fraction, by estimation, is 40 to 45%. Left ventricular ejection fraction by 3D volume is 43 %. The left ventricle has mildly decreased function. The left ventricle demonstrates mild global hypokinesis with moderate inferior  wall hypokinesis. The left ventricular internal cavity size was mildly dilated. Left ventricular diastolic parameters are consistent with Grade I diastolic dysfunction (impaired relaxation). The average left ventricular global longitudinal strain is -9.5 %.  2. Right ventricular systolic function is normal. The right ventricular size is normal.  3. Left atrial size was mildly dilated.  4. The mitral valve is normal in structure. Mild mitral valve regurgitation. No evidence of mitral stenosis.  5. The aortic valve is tricuspid. Aortic valve regurgitation is mild. No aortic stenosis is present.  6. There is mild dilatation of the ascending aorta, measuring 42 mm.  7. The inferior vena cava is normal in size with greater than 50% respiratory variability, suggesting right atrial pressure of 3 mmHg. FINDINGS  Left Ventricle: Left ventricular ejection fraction, by estimation, is 40 to 45%. Left ventricular ejection fraction by 3D volume is 43 %. The left ventricle has mildly decreased function. The left ventricle demonstrates global hypokinesis. Definity contrast agent was given IV to delineate the left ventricular endocardial borders. The average left ventricular global  longitudinal strain is -9.5 %. The left ventricular internal cavity size was mildly dilated. There is no left ventricular hypertrophy. Left ventricular diastolic parameters are consistent with Grade I diastolic dysfunction (impaired relaxation). Right Ventricle: The right ventricular size is normal. No increase in right ventricular wall thickness. Right ventricular systolic function is normal. Left Atrium: Left atrial size was mildly dilated. Right Atrium: Right atrial size was normal in size. Pericardium: There is no evidence of pericardial effusion. Mitral Valve: The mitral valve is normal in structure. Mild mitral valve regurgitation. No evidence of mitral valve stenosis. Tricuspid Valve: The tricuspid valve is normal in structure. Tricuspid valve regurgitation is not demonstrated. No evidence of tricuspid stenosis. Aortic Valve: The aortic valve is  Discharge Summary    Patient ID: [name] MRN: [id number]; DOB: Jun 11, 1950  Admit date: 12/27/2022 Discharge date: 12/28/2022  Primary Care Provider: [name], MD  Primary Cardiologist: Yvonne Kendall, MD  Primary Electrophysiologist:  Lanier Prude, MD   Discharge Diagnoses    Principal Problem:   Coronary artery disease Active Problems:   HFrEF (heart failure with reduced ejection fraction) (HCC)   Diagnostic Studies/Procedures   Coronary Stent Intervention 12/27/2022 Conclusions: Severe native coronary artery disease, including 80% distal LMCA stenosis and chronic total occlusions of D2 and OM1.  Culprit disease for the patient's recurrent atypical angina and worsening LVEF is likely sequential 50% and 90% stenoses in the proximal/mid RCA. Widely patent LIMA-D2 and SVG Y-graft to OM1 and OM4. Chronically occluded SVG-RPDA. Successful PCI to proximal/mid RCA using Onyx Frontier 3.0 x 30 mm drug-eluting stent (postdilated to 3.6 mm) with 0% residual stenosis and TIMI-3 flow.   Recommendations: Overnight observation. Dual antiplatelet therapy with aspirin and clopidogrel for at least 6 months, potentially longer.  Consider resumption of apixaban with history of PAF at the discretion of electrophysiology (upcoming visit next month).  If apixaban is restarted, would recommend discontinuation of aspirin. Aggressive secondary prevention of coronary artery disease.  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 Continue outpatient escalation of goal-directed medical therapy for heart failure with mildly reduced ejection fraction as tolerated.  Diagnostic Dominance: Right  Intervention   _____________   History of Present Illness     [name] is a 72 y.o. 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  Hospital Course     Consultants: none   Mr. Feezor was seen in clinic on 12/15/2022 with continued fatigue.  Prior echocardiogram revealed a recurrence of cardiomyopathy with an EF of 40-45% as well as an inferior wall hypokinesis.  To further evaluate his cardiomyopathy and potential PVC burden he was scheduled for a right and left heart catheterization.  Due to contrast allergy he was started on contrast allergy prophylaxis.  He was scheduled for the right and left heart catheterization as an elective procedure on 12/27/2022.  He had successful PCI to the proximal/mid RCA.  Recommended for overnight observation.  He was started on dual antiplatelet therapy with aspirin and complete agreeable 75 mg daily for at least 6 months potentially longer.  Consider resumption of apixaban with history of PAF at the discretion of electrophysiology at his upcoming appointment next month.  If apixaban is restarted will recommend the discontinuation of aspirin therapy.  He was also advised on aggressive secondary prevention of coronary artery disease.  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  He was also encouraged to continue with outpatient escalation of goal-directed medical therapy for heart failure and mildly reduced ejection fraction.  He was maintained on telemetry overnight.  Vital signs  12/13/2022 Medical Rec #:  [id number]    Height:       66.5 in Accession #:    [id number]   Weight:       183.2 lb Date of Birth:  Sep 05, 1950   BSA:          1.937 m Patient Age:    71 years     BP:           128/84 mmHg Patient Gender: M            HR:           74 bpm. Exam Location:  [facility] Procedure: 2D Echo, 3D Echo, Cardiac Doppler, Color Doppler and Intracardiac            Opacification Agent Indications:    I50.20*  Unspecified systolic (congestive) heart failure  History:        Patient has prior history of Echocardiogram examinations, most                 recent 08/10/2020. CHF, CAD and Previous Myocardial Infarction,                 Carotid Disease and PAD, Arrythmias:LBBB; Risk Factors:Former                 Smoker, Hypertension and Dyslipidemia.  Sonographer:    Quentin Ore RDMS, RVT, RDCS Referring Phys: [id number] Lanier Prude  Sonographer Comments: Global longitudinal strain was attempted. IMPRESSIONS  1. Left ventricular ejection fraction, by estimation, is 40 to 45%. Left ventricular ejection fraction by 3D volume is 43 %. The left ventricle has mildly decreased function. The left ventricle demonstrates mild global hypokinesis with moderate inferior  wall hypokinesis. The left ventricular internal cavity size was mildly dilated. Left ventricular diastolic parameters are consistent with Grade I diastolic dysfunction (impaired relaxation). The average left ventricular global longitudinal strain is -9.5 %.  2. Right ventricular systolic function is normal. The right ventricular size is normal.  3. Left atrial size was mildly dilated.  4. The mitral valve is normal in structure. Mild mitral valve regurgitation. No evidence of mitral stenosis.  5. The aortic valve is tricuspid. Aortic valve regurgitation is mild. No aortic stenosis is present.  6. There is mild dilatation of the ascending aorta, measuring 42 mm.  7. The inferior vena cava is normal in size with greater than 50% respiratory variability, suggesting right atrial pressure of 3 mmHg. FINDINGS  Left Ventricle: Left ventricular ejection fraction, by estimation, is 40 to 45%. Left ventricular ejection fraction by 3D volume is 43 %. The left ventricle has mildly decreased function. The left ventricle demonstrates global hypokinesis. Definity contrast agent was given IV to delineate the left ventricular endocardial borders. The average left ventricular global  longitudinal strain is -9.5 %. The left ventricular internal cavity size was mildly dilated. There is no left ventricular hypertrophy. Left ventricular diastolic parameters are consistent with Grade I diastolic dysfunction (impaired relaxation). Right Ventricle: The right ventricular size is normal. No increase in right ventricular wall thickness. Right ventricular systolic function is normal. Left Atrium: Left atrial size was mildly dilated. Right Atrium: Right atrial size was normal in size. Pericardium: There is no evidence of pericardial effusion. Mitral Valve: The mitral valve is normal in structure. Mild mitral valve regurgitation. No evidence of mitral valve stenosis. Tricuspid Valve: The tricuspid valve is normal in structure. Tricuspid valve regurgitation is not demonstrated. No evidence of tricuspid stenosis. Aortic Valve: The aortic valve is  E/A ratio:  0.71        Systemic VTI:  0.16  m                            Systemic Diam: 2.20 cm Julien Nordmann MD Electronically signed by Julien Nordmann MD Signature Date/Time: 12/13/2022/1:10:21 PM    Final    VAS US CAROTID  Result Date: 11/30/2022 Carotid Arterial Duplex Study Patient Name:  [name]  Date of Exam:   11/30/2022 Medical Rec #: [id number]     Accession #:    [id number] Date of Birth: 09/10/1950    Patient Gender: M Patient Age:   70 years Exam Location:  [facility] Procedure:      VAS US CAROTID Referring Phys: Lemar Livings --------------------------------------------------------------------------------  Indications:       Carotid artery disease. Risk Factors:      Hypertension, hyperlipidemia, past history of smoking,                    coronary artery disease. Comparison Study:  05/04/2022 Carotid artery duplex- Right Carotid: Velocities in                    the right ICA are consistent with a 1-39% stenosis.                     Left Carotid: Velocities in the left ICA are consistent with                    a 60-79%                    stenosis. The ECA appears >50% stenosed. Performing Technologist: Gertie Fey MHA, RDMS, RVT, RDCS  Examination Guidelines: A complete evaluation includes B-mode imaging, spectral Doppler, color Doppler, and power Doppler as needed of all accessible portions of each vessel. Bilateral testing is considered an integral part of a complete examination. Limited examinations for reoccurring indications may be performed as noted.  Right Carotid Findings: +---------+--------+-------+--------+---------------------------------+--------+          PSV cm/sEDV    StenosisPlaque Description               Comments                  cm/s                                                     +---------+--------+-------+--------+---------------------------------+--------+ CCA Prox 34      9                                                         +---------+--------+-------+--------+---------------------------------+--------+ CCA      55      20             heterogenous and irregular                Distal                                                                    +---------+--------+-------+--------+---------------------------------+--------+  Discharge Summary    Patient ID: [name] MRN: [id number]; DOB: Jun 11, 1950  Admit date: 12/27/2022 Discharge date: 12/28/2022  Primary Care Provider: [name], MD  Primary Cardiologist: Yvonne Kendall, MD  Primary Electrophysiologist:  Lanier Prude, MD   Discharge Diagnoses    Principal Problem:   Coronary artery disease Active Problems:   HFrEF (heart failure with reduced ejection fraction) (HCC)   Diagnostic Studies/Procedures   Coronary Stent Intervention 12/27/2022 Conclusions: Severe native coronary artery disease, including 80% distal LMCA stenosis and chronic total occlusions of D2 and OM1.  Culprit disease for the patient's recurrent atypical angina and worsening LVEF is likely sequential 50% and 90% stenoses in the proximal/mid RCA. Widely patent LIMA-D2 and SVG Y-graft to OM1 and OM4. Chronically occluded SVG-RPDA. Successful PCI to proximal/mid RCA using Onyx Frontier 3.0 x 30 mm drug-eluting stent (postdilated to 3.6 mm) with 0% residual stenosis and TIMI-3 flow.   Recommendations: Overnight observation. Dual antiplatelet therapy with aspirin and clopidogrel for at least 6 months, potentially longer.  Consider resumption of apixaban with history of PAF at the discretion of electrophysiology (upcoming visit next month).  If apixaban is restarted, would recommend discontinuation of aspirin. Aggressive secondary prevention of coronary artery disease.  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 Continue outpatient escalation of goal-directed medical therapy for heart failure with mildly reduced ejection fraction as tolerated.  Diagnostic Dominance: Right  Intervention   _____________   History of Present Illness     [name] is a 72 y.o. 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  Hospital Course     Consultants: none   Mr. Feezor was seen in clinic on 12/15/2022 with continued fatigue.  Prior echocardiogram revealed a recurrence of cardiomyopathy with an EF of 40-45% as well as an inferior wall hypokinesis.  To further evaluate his cardiomyopathy and potential PVC burden he was scheduled for a right and left heart catheterization.  Due to contrast allergy he was started on contrast allergy prophylaxis.  He was scheduled for the right and left heart catheterization as an elective procedure on 12/27/2022.  He had successful PCI to the proximal/mid RCA.  Recommended for overnight observation.  He was started on dual antiplatelet therapy with aspirin and complete agreeable 75 mg daily for at least 6 months potentially longer.  Consider resumption of apixaban with history of PAF at the discretion of electrophysiology at his upcoming appointment next month.  If apixaban is restarted will recommend the discontinuation of aspirin therapy.  He was also advised on aggressive secondary prevention of coronary artery disease.  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  He was also encouraged to continue with outpatient escalation of goal-directed medical therapy for heart failure and mildly reduced ejection fraction.  He was maintained on telemetry overnight.  Vital signs  Proximal Graft      51              triphasic                              +--------------------+--------+--------+---------+-----------------------------+ Mid Graft           51              triphasicgraft dilation with occluded                                               branch (possible site of                                                    previous intervention)        +--------------------+--------+--------+---------+-----------------------------+ Distal Graft        45              triphasic                              +--------------------+--------+--------+---------+-----------------------------+ Distal Anastomosis  39              triphasic                              +--------------------+--------+--------+---------+-----------------------------+ Outflow             45              triphasicturbulent                     +--------------------+--------+--------+---------+-----------------------------+   Summary: Left: Patent left femoral-below knee popliteal artery bypass graft without evidence of hemodynamically significant stenosis. Low velocities in the graft could suggest a threatened bypass.  See table(s) above for measurements and observations. Electronically signed by Lemar Livings MD on 11/30/2022 at 5:05:24 PM.    Final    VAS [place] ABI WITH/WO TBI  Result Date: 11/30/2022  LOWER EXTREMITY DOPPLER STUDY Patient Name:  [name]  Date of Exam:   11/30/2022 Medical Rec #: [id number]     Accession #:    [id number] Date of Birth: 07/16/1950    Patient Gender: M Patient Age:   72 years Exam Location:  [facility] Procedure:      VAS [place] ABI WITH/WO TBI Referring Phys: EMMA COLLINS --------------------------------------------------------------------------------  Indications: Peripheral artery disease. High Risk Factors: Hypertension, hyperlipidemia, past history of smoking, prior                    MI, coronary artery disease. Other Factors: S/P CABGx4, PVD.  Vascular Interventions: 01/25/2022 Left femoral to below knee popliteal bypass                         12/28/21- LLE angiogram, mech thrombectomy left SFA and                         pop with the penumbra CAT 7 bolt device. PTA of the left  Discharge Summary    Patient ID: [name] MRN: [id number]; DOB: Jun 11, 1950  Admit date: 12/27/2022 Discharge date: 12/28/2022  Primary Care Provider: [name], MD  Primary Cardiologist: Yvonne Kendall, MD  Primary Electrophysiologist:  Lanier Prude, MD   Discharge Diagnoses    Principal Problem:   Coronary artery disease Active Problems:   HFrEF (heart failure with reduced ejection fraction) (HCC)   Diagnostic Studies/Procedures   Coronary Stent Intervention 12/27/2022 Conclusions: Severe native coronary artery disease, including 80% distal LMCA stenosis and chronic total occlusions of D2 and OM1.  Culprit disease for the patient's recurrent atypical angina and worsening LVEF is likely sequential 50% and 90% stenoses in the proximal/mid RCA. Widely patent LIMA-D2 and SVG Y-graft to OM1 and OM4. Chronically occluded SVG-RPDA. Successful PCI to proximal/mid RCA using Onyx Frontier 3.0 x 30 mm drug-eluting stent (postdilated to 3.6 mm) with 0% residual stenosis and TIMI-3 flow.   Recommendations: Overnight observation. Dual antiplatelet therapy with aspirin and clopidogrel for at least 6 months, potentially longer.  Consider resumption of apixaban with history of PAF at the discretion of electrophysiology (upcoming visit next month).  If apixaban is restarted, would recommend discontinuation of aspirin. Aggressive secondary prevention of coronary artery disease.  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 Continue outpatient escalation of goal-directed medical therapy for heart failure with mildly reduced ejection fraction as tolerated.  Diagnostic Dominance: Right  Intervention   _____________   History of Present Illness     [name] is a 72 y.o. 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  Hospital Course     Consultants: none   Mr. Feezor was seen in clinic on 12/15/2022 with continued fatigue.  Prior echocardiogram revealed a recurrence of cardiomyopathy with an EF of 40-45% as well as an inferior wall hypokinesis.  To further evaluate his cardiomyopathy and potential PVC burden he was scheduled for a right and left heart catheterization.  Due to contrast allergy he was started on contrast allergy prophylaxis.  He was scheduled for the right and left heart catheterization as an elective procedure on 12/27/2022.  He had successful PCI to the proximal/mid RCA.  Recommended for overnight observation.  He was started on dual antiplatelet therapy with aspirin and complete agreeable 75 mg daily for at least 6 months potentially longer.  Consider resumption of apixaban with history of PAF at the discretion of electrophysiology at his upcoming appointment next month.  If apixaban is restarted will recommend the discontinuation of aspirin therapy.  He was also advised on aggressive secondary prevention of coronary artery disease.  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  He was also encouraged to continue with outpatient escalation of goal-directed medical therapy for heart failure and mildly reduced ejection fraction.  He was maintained on telemetry overnight.  Vital signs  Proximal Graft      51              triphasic                              +--------------------+--------+--------+---------+-----------------------------+ Mid Graft           51              triphasicgraft dilation with occluded                                               branch (possible site of                                                    previous intervention)        +--------------------+--------+--------+---------+-----------------------------+ Distal Graft        45              triphasic                              +--------------------+--------+--------+---------+-----------------------------+ Distal Anastomosis  39              triphasic                              +--------------------+--------+--------+---------+-----------------------------+ Outflow             45              triphasicturbulent                     +--------------------+--------+--------+---------+-----------------------------+   Summary: Left: Patent left femoral-below knee popliteal artery bypass graft without evidence of hemodynamically significant stenosis. Low velocities in the graft could suggest a threatened bypass.  See table(s) above for measurements and observations. Electronically signed by Lemar Livings MD on 11/30/2022 at 5:05:24 PM.    Final    VAS [place] ABI WITH/WO TBI  Result Date: 11/30/2022  LOWER EXTREMITY DOPPLER STUDY Patient Name:  [name]  Date of Exam:   11/30/2022 Medical Rec #: [id number]     Accession #:    [id number] Date of Birth: 07/16/1950    Patient Gender: M Patient Age:   72 years Exam Location:  [facility] Procedure:      VAS [place] ABI WITH/WO TBI Referring Phys: EMMA COLLINS --------------------------------------------------------------------------------  Indications: Peripheral artery disease. High Risk Factors: Hypertension, hyperlipidemia, past history of smoking, prior                    MI, coronary artery disease. Other Factors: S/P CABGx4, PVD.  Vascular Interventions: 01/25/2022 Left femoral to below knee popliteal bypass                         12/28/21- LLE angiogram, mech thrombectomy left SFA and                         pop with the penumbra CAT 7 bolt device. PTA of the left

## 2022-12-29 LAB — LIPOPROTEIN A (LPA): Lipoprotein (a): 44.4 nmol/L — ABNORMAL HIGH (ref <75.0)

## 2022-12-30 ENCOUNTER — Other Ambulatory Visit: Payer: Self-pay | Admitting: Internal Medicine

## 2022-12-30 DIAGNOSIS — M5416 Radiculopathy, lumbar region: Secondary | ICD-10-CM | POA: Diagnosis not present

## 2022-12-30 DIAGNOSIS — M48062 Spinal stenosis, lumbar region with neurogenic claudication: Secondary | ICD-10-CM | POA: Diagnosis not present

## 2023-01-06 NOTE — Progress Notes (Signed)
Remote ICD transmission.   

## 2023-01-09 ENCOUNTER — Telehealth: Payer: Self-pay | Admitting: Internal Medicine

## 2023-01-09 ENCOUNTER — Other Ambulatory Visit: Payer: Self-pay | Admitting: Family Medicine

## 2023-01-09 DIAGNOSIS — M5416 Radiculopathy, lumbar region: Secondary | ICD-10-CM

## 2023-01-09 NOTE — Telephone Encounter (Signed)
   Name: [name]  DOB: 1950-07-01  MRN: [id number]   Primary Cardiologist: Yvonne Kendall, MD  Chart reviewed as part of pre-operative protocol coverage.   [name] underwent recent percutaneous coronary intervention with DES to proximal to mid RCA on 12/27/2022. Per Dr. Okey Dupre, patient to be on DAPT with aspirin and clopidogrel for at least 6 months, potentially longer.  Therefore, Plavix and aspirin cannot be interrupted.   I will route this recommendation to the requesting party via Epic fax function and remove from pre-op pool. Please call with questions.  Carlos Levering, NP 01/09/2023, 9:40 AM

## 2023-01-09 NOTE — Telephone Encounter (Signed)
   Pre-operative Risk Assessment    Patient Name: [name]  DOB: [birth year] MRN: [id number]      Request for Surgical Clearance    Procedure:   nerve, root block epidural  Date of Surgery:  Clearance TBD                                 Surgeon:  not indicated Surgeon's Group or Practice Name:  [facility] Phone number:  [phone] Fax number:  [phone]   Type of Clearance Requested:   - Pharmacy:  Hold Clopidogrel (Plavix) stop 5 days prior   Type of Anesthesia:  Local    Additional requests/questions:    Queen Sl[place]   01/09/2023, 9:26 AM

## 2023-01-15 NOTE — Progress Notes (Unsigned)
Cardiology Office Note Date:  01/16/2023  Patient ID:  [name], [name] 1951-01-13, MRN [id number] PCP:  [name], MD  Cardiologist:  Yvonne Kendall, MD Electrophysiologist: Lanier Prude, MD     Chief Complaint: 6mon device check, PVC  History of Present Illness: [name] is a 72 y.o. male with PMH notable for HFrEF, mixed NICM and ICM, PVCs, s/p CRT-D, CAD s/p CABG, post-operative AFib, PAD; seen today for Lanier Prude, MD for routine electrophysiology followup.  He last saw Dr. Lalla Brothers 11/2021. Has historically low BiV pacing d/t PVCs. Amiodarone had been started earlier in 2023, but patient had stopped d/t side effects of feeling poorly. Mexiletine was started at that visit and if unsuccessful at [facility] suppression, planned to initiate sotalol. I saw him in follow-up 05/2022 where he tolerating mexiletine well. Only BiV pacing 86%, discussed sotalol but patient deferred since he was feeling so well.  He had routine TTE 11/2022 where his LVEF had reduced. He saw PA Dunn and [facility] was recommended given his CAD history. He is now s/p PCI.   On follow-up today, he feels better since his stent placement, has more energy. Previously, after a round of golf he would feel completely exhausted.  He requests to stop mexiletine d/t fatigue.  He denies chest pain, chest pressure, palpitations. Has good appetite, no syncope or presyncope.    Device Information: [employer] CRT-D, 03/23/2020; dx ICM  AAD History: Amiodarone - 07/2021 - dc'd sometime prior to 11/2021. Pt felt poorly Mexiletine - 11/2021  Past Medical History:  Diagnosis Date   Abnormal nuclear cardiac imaging test 08/08/2015   AICD (automatic cardioverter/defibrillator) present    pacemaker/defib St Jude[employer]   Arthritis    fingers   Carotid artery occlusion    CHF (congestive heart failure) (HCC)    Coronary atherosclerosis of native coronary artery 01/29/2013   11/05/19 R/LHC 80% dLMCA stenosis small  diffusely dz dLAD, chronically occluded OM1 50%mRCA lesion, widely patent mLCx strent, moderately elevated L heart filling pressures, mild to moderate RH filling pressures, normal to moderately reduced CO   Duodenal erosion    Encounter for screening for lung cancer 07/13/2016   Esophageal stenosis    esophageal dilation   GERD (gastroesophageal reflux disease)    H. 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 Cardiac catheterization-nonobstructive 60% RCA disease-subsequent nuclear stress test-9 minutes, low risk, mild inferior wall hypokinesis    Pain in limb 12/19/2017   Peripheral vascular disease (HCC)    Unstable angina (HCC) 11/25/2017    Past Surgical History:  Procedure Laterality Date   ABDOMINAL AORTOGRAM W/LOWER EXTREMITY Left 01/24/2022   Procedure: ABDOMINAL AORTOGRAM W/LOWER EXTREMITY;  Surgeon: Maeola Harman, MD;  Location: [facility] INVASIVE CV LAB;  Service: Cardiovascular;  Laterality: Left;   APPENDECTOMY     BACK SURGERY     BIV ICD INSERTION CRT-D N/A 03/23/2020   Procedure: BIV ICD INSERTION CRT-D;  Surgeon: Lanier Prude, MD;  Location: [facility] INVASIVE CV LAB;  Service: Cardiovascular;  Laterality: N/A;   CARDIAC CATHETERIZATION N/A 08/07/2015   Procedure: Left Heart Cath and Coronary Angiography;  Surgeon: Jake Bathe, MD;  Location: [facility] INVASIVE CV LAB;  Service: Cardiovascular;  Laterality: N/A;   CARDIAC CATHETERIZATION N/A  08/07/2015   Procedure: Coronary Stent Intervention;  Surgeon: Jake Bathe, MD;  Location: [facility] INVASIVE CV LAB;  Service: Cardiovascular;  Laterality: N/A;   CARDIAC CATHETERIZATION N/A 08/07/2015   Procedure: Coronary Stent Intervention;  Surgeon: Peter M Swaziland, MD;  Location: [facility] INVASIVE CV LAB;  Service: Cardiovascular;  Laterality: N/A;   CAROTID  ENDARTERECTOMY  01/05/2006   Right  CEA with DPA   CATARACT EXTRACTION W/ INTRAOCULAR LENS IMPLANT Left 12/04/2017   CATARACT EXTRACTION W/PHACO Left 12/04/2017   Procedure: CATARACT EXTRACTION PHACO AND INTRAOCULAR LENS PLACEMENT (IOC) LEFT;  Surgeon: Nevada Crane, MD;  Location: [facility] SURGERY CNTR;  Service: Ophthalmology;  Laterality: Left;   CATARACT EXTRACTION W/PHACO Right 02/06/2018   Procedure: CATARACT EXTRACTION PHACO AND INTRAOCULAR LENS PLACEMENT (IOC)RIGHT;  Surgeon: Nevada Crane, MD;  Location: [facility] SURGERY CNTR;  Service: Ophthalmology;  Laterality: Right;   COLONOSCOPY  05/20/2008   COLONOSCOPY WITH PROPOFOL N/A 09/13/2018   Procedure: COLONOSCOPY WITH BIOPSY;  Surgeon: Midge Minium, MD;  Location: [facility] SURGERY CNTR;  Service: Endoscopy;  Laterality: N/A;   CORONARY ARTERY BYPASS GRAFT N/A 11/11/2019   Procedure: CORONARY ARTERY BYPASS GRAFTING (CABG) USING LIMA to Diag1; ENDOSCOPICALLY HARVESTED RIGHT GREATER SAPHENOUS VEIN: SVG to OM1; SVG to OM2; SVG to PDA.;  Surgeon: Kerin Perna, MD;  Location: [facility] OR;  Service: Open Heart Surgery;  Laterality: N/A;   CORONARY STENT INTERVENTION N/A 12/27/2022   Procedure: CORONARY STENT INTERVENTION;  Surgeon: Yvonne Kendall, MD;  Location: [facility] INVASIVE CV LAB;  Service: Cardiovascular;  Laterality: N/A;   CORONARY STENT PLACEMENT  08/07/2015   MID CIRCUMFLEX   ENDARTERECTOMY FEMORAL Bilateral 09/30/2020   Procedure: ENDARTERECTOMY FEMORAL;  Surgeon: Annice Needy, MD;  Location: [facility] ORS;  Service: Vascular;  Laterality: Bilateral;   ENDARTERECTOMY POPLITEAL Left 01/25/2022   Procedure: ENDARTERECTOMY POPLITEAL;  Surgeon: Maeola Harman, MD;  Location: [facility] OR;  Service: Vascular;  Laterality: Left;   ENDOVEIN HARVEST OF GREATER SAPHENOUS VEIN Right 11/11/2019   Procedure: ENDOVEIN HARVEST OF GREATER SAPHENOUS VEIN;  Surgeon: Kerin Perna, MD;  Location: [facility] OR;  Service: Open Heart Surgery;  Laterality: Right;    ESOPHAGOGASTRODUODENOSCOPY (EGD) WITH PROPOFOL N/A 02/11/2019   Procedure: ESOPHAGOGASTRODUODENOSCOPY (EGD) WITH BIOPSY and  Dilation;  Surgeon: Midge Minium, MD;  Location: [facility] SURGERY CNTR;  Service: Endoscopy;  Laterality: N/A;   ESOPHAGOGASTRODUODENOSCOPY (EGD) WITH PROPOFOL N/A 12/23/2021   Procedure: ESOPHAGOGASTRODUODENOSCOPY (EGD) WITH PROPOFOL;  Surgeon: Midge Minium, MD;  Location: [facility] ENDOSCOPY;  Service: Endoscopy;  Laterality: N/A;   FEMORAL-FEMORAL BYPASS GRAFT Left 01/25/2022   Procedure: left Femoral to below the knee Popliteal bypass.;  Surgeon: Maeola Harman, MD;  Location: [facility] OR;  Service: Vascular;  Laterality: Left;   HIP SURGERY Left 10/2016   left hip tendon repair   LEFT HEART CATH AND CORONARY ANGIOGRAPHY N/A 11/27/2017   Procedure: LEFT HEART CATH AND CORONARY ANGIOGRAPHY;  Surgeon: Iran Ouch, MD;  Location: [facility] INVASIVE CV LAB;  Service: Cardiovascular;  Laterality: N/A;   LOWER EXTREMITY ANGIOGRAPHY Left 02/12/2018   Procedure: LOWER EXTREMITY ANGIOGRAPHY;  Surgeon: Annice Needy, MD;  Location: [facility] INVASIVE CV LAB;  Service: Cardiovascular;  Laterality: Left;   LOWER EXTREMITY ANGIOGRAPHY Left 03/07/2018   Procedure: LOWER EXTREMITY ANGIOGRAPHY;  Surgeon: Annice Needy, MD;  Location: [facility] INVASIVE CV LAB;  Service: Cardiovascular;  Laterality: Left;   LOWER EXTREMITY ANGIOGRAPHY Left 06/04/2018   Procedure: LOWER EXTREMITY ANGIOGRAPHY;  Surgeon: Annice Needy, MD;  Location:  [facility] INVASIVE CV LAB;  Service: Cardiovascular;  Laterality: Left;   LOWER EXTREMITY ANGIOGRAPHY Left 09/17/2020   Procedure: LOWER EXTREMITY ANGIOGRAPHY;  Surgeon: Annice Needy, MD;  Location: [facility] INVASIVE CV LAB;  Service: Cardiovascular;  Laterality: Left;   LOWER EXTREMITY ANGIOGRAPHY Left 09/28/2020   Procedure: LOWER EXTREMITY ANGIOGRAPHY;  Surgeon: Annice Needy, MD;  Location: [facility] INVASIVE CV LAB;  Service: Cardiovascular;  Laterality: Left;   LOWER EXTREMITY  ANGIOGRAPHY N/A 02/15/2021   Procedure: LOWER EXTREMITY ANGIOGRAPHY;  Surgeon: Annice Needy, MD;  Location: [facility] INVASIVE CV LAB;  Service: Cardiovascular;  Laterality: N/A;   LOWER EXTREMITY ANGIOGRAPHY Left 12/27/2021   Procedure: Lower Extremity Angiography;  Surgeon: Annice Needy, MD;  Location: [facility] INVASIVE CV LAB;  Service: Cardiovascular;  Laterality: Left;   LOWER EXTREMITY ANGIOGRAPHY Left 12/28/2021   Procedure: Lower Extremity Angiography;  Surgeon: Annice Needy, MD;  Location: [facility] INVASIVE CV LAB;  Service: Cardiovascular;  Laterality: Left;   PLACEMENT OF IMPELLA LEFT VENTRICULAR ASSIST DEVICE N/A 11/11/2019   Procedure: PLACEMENT OF IMPELLA LEFT VENTRICULAR ASSIST DEVICE 5.5;  Surgeon: Kerin Perna, MD;  Location: [facility] OR;  Service: Open Heart Surgery;  Laterality: N/A;  Midline Sternotomy   POLYPECTOMY N/A 09/13/2018   Procedure: POLYPECTOMY;  Surgeon: Midge Minium, MD;  Location: [facility] SURGERY CNTR;  Service: Endoscopy;  Laterality: N/A;   POLYPECTOMY N/A 02/11/2019   Procedure: POLYPECTOMY;  Surgeon: Midge Minium, MD;  Location: [facility];  Service: Endoscopy;  Laterality: N/A;   REMOVAL OF IMPELLA LEFT VENTRICULAR ASSIST DEVICE N/A 11/15/2019   Procedure: REMOVAL OF IMPELLA 5.5 LEFT VENTRICULAR ASSIST DEVICE;  Surgeon: Kerin Perna, MD;  Location: [facility] OR;  Service: Open Heart Surgery;  Laterality: N/A;   RIGHT/LEFT HEART CATH AND CORONARY ANGIOGRAPHY N/A 11/05/2019   Procedure: RIGHT/LEFT HEART CATH AND CORONARY ANGIOGRAPHY;  Surgeon: Yvonne Kendall, MD;  Location: [facility] INVASIVE CV LAB;  Service: Cardiovascular;  Laterality: N/A;   RIGHT/LEFT HEART CATH AND CORONARY/GRAFT ANGIOGRAPHY Bilateral 12/27/2022   Procedure: RIGHT/LEFT HEART CATH AND CORONARY/GRAFT ANGIOGRAPHY;  Surgeon: Yvonne Kendall, MD;  Location: [facility] INVASIVE CV LAB;  Service: Cardiovascular;  Laterality: Bilateral;   SPINE SURGERY     TEE WITHOUT CARDIOVERSION N/A 11/11/2019   Procedure:  TRANSESOPHAGEAL ECHOCARDIOGRAM (TEE);  Surgeon: Donata Clay, Theron Arista, MD;  Location: [facility] OR;  Service: Open Heart Surgery;  Laterality: N/A;   TEE WITHOUT CARDIOVERSION N/A 11/15/2019   Procedure: TRANSESOPHAGEAL ECHOCARDIOGRAM (TEE);  Surgeon: Donata Clay, Theron Arista, MD;  Location: [facility] OR;  Service: Open Heart Surgery;  Laterality: N/A;   TONSILLECTOMY      Current Outpatient Medications  Medication Instructions   aspirin EC 81 mg, Oral, Daily   carvedilol (COREG) 12.5 MG tablet TAKE (1) TABLET BY MOUTH TWICE DAILY.   clopidogrel (PLAVIX) 75 mg, Oral, Daily with breakfast   colchicine 0.6 mg, Oral, 2 times daily PRN   methocarbamol (ROBAXIN) 500 mg, Oral, 1 po q occasionally as needed for pain/spasm. No more than one every few days.   mexiletine (MEXITIL) 150 MG capsule TAKE 1 CAP BY MOUTH TWICE DAILY   nitroGLYCERIN (NITROSTAT) 0.4 mg, Sublingual, Every 5 min PRN   oxyCODONE-acetaminophen (PERCOCET) 5-325 MG tablet 1 tablet, Oral, Every 8 hours PRN   Praluent 150 mg, Subcutaneous, Every 14 days   sacubitril-valsartan (ENTRESTO) 49-51 MG 1 tablet, Oral, 2 times daily   traZODone (DESYREL) 50 mg, Oral, At bedtime PRN, for sleep    Social History:  The patient  reports  that he quit smoking about 17 years ago. His smoking use included cigarettes. He started smoking about 52 years ago. He has a 43.8 pack-year smoking history. He has never been exposed to tobacco smoke. His smokeless tobacco use includes snuff. He reports current alcohol use of about 10.0 standard drinks of alcohol per week. He reports that he does not use drugs.   Family History:  The patient's family history includes Cancer (age of onset: 2) in his father; Coronary artery disease in his mother; Diabetes in his mother; Heart attack in his father and mother; Heart disease in his father and mother; Hypertension in his father and mother; Stroke in his father.  ROS:  Please see the history of present illness. All other systems are reviewed and  otherwise negative.   PHYSICAL EXAM:  VS:  BP 100/68   Pulse 70   Ht 5\' 6"  (1.676 m)   Wt 180 lb (81.6 kg)   SpO2 95%   BMI 29.05 kg/m  BMI: Body mass index is 29.05 kg/m.  GEN- The patient is well appearing, alert and oriented x 3 today.   Lungs- Clear to ausculation bilaterally, normal work of breathing.  Heart- Regular rate and rhythm with ectopy, no murmurs, rubs or gallops Extremities- No peripheral edema, warm, dry Skin-  device pocket well-healed   Device interrogation done today and reviewed by myself:  Battery 2.8 years Lead thresholds, impedence, sensing stable  BiV pacing 87% Frequent AMS episodes, appears to be d/t retrograde conduction One NSVT episode, 8 seconds in duration Increased PVARP to   EKG is ordered. Personal review of EKG from today shows:    AV-paced at 70bpm   Recent Labs: 03/22/2022: ALT 21; TSH 2.510 12/28/2022: BUN 24; Creatinine, Ser 0.89; Hemoglobin 13.9; Platelets 145; Potassium 3.8; Sodium 136  03/22/2022: Chol/HDL Ratio 2.5; Cholesterol, Total 135; HDL 55; LDL Chol Calc (NIH) 59; Triglycerides 120   Estimated Creatinine Clearance: 75.2 mL/min (by C-G formula based on SCr of 0.89 mg/dL).   Wt Readings from Last 3 Encounters:  01/16/23 180 lb (81.6 kg)  01/16/23 180 lb (81.6 kg)  12/27/22 181 lb 9.6 oz (82.4 kg)     Additional studies reviewed include: Previous EP, cardiology notes.   Coronary stent intervention, 12/27/2022 Severe native coronary artery disease, including 80% distal LMCA stenosis and chronic total occlusions of D2 and OM1.  Culprit disease for the patient's recurrent atypical angina and worsening LVEF is likely sequential 50% and 90% stenoses in the proximal/mid RCA. Widely patent LIMA-D2 and SVG Y-graft to OM1 and OM4. Chronically occluded SVG-RPDA. Upper normal to mildly elevated left heart and pulmonary artery pressures. Mildly elevated right heart filling pressure. Normal Fick cardiac  output/index. Successful PCI to proximal/mid RCA using Onyx Frontier 3.0 x 30 mm drug-eluting stent (postdilated to 3.6 mm) with 0% residual stenosis and TIMI-3 flow.  TTE, 12/13/2022  1. Left ventricular ejection fraction, by estimation, is 40 to 45%. Left ventricular ejection fraction by 3D volume is 43 %. The left ventricle has mildly decreased function. The left ventricle demonstrates mild global hypokinesis with moderate inferior  wall hypokinesis. The left ventricular internal cavity size was mildly dilated. Left ventricular diastolic parameters are consistent with Grade I diastolic dysfunction (impaired relaxation). The average left ventricular global longitudinal strain is -9.5 %.   2. Right ventricular systolic function is normal. The right ventricular size is normal.   3. Left atrial size was mildly dilated.   4. The mitral valve is normal in  structure. Mild mitral valve regurgitation. No evidence of mitral stenosis.   5. The aortic valve is tricuspid. Aortic valve regurgitation is mild. No aortic stenosis is present.   6. There is mild dilatation of the ascending aorta, measuring 42 mm.   7. The inferior vena cava is normal in size with greater than 50% respiratory variability, suggesting right atrial pressure of 3 mmHg.   TTE, 08/10/2020  1. Left ventricular ejection fraction, by estimation, is 50 to 55%. The left ventricle has low normal function. The left ventricle has no regional wall motion abnormalities. The left ventricular internal cavity size was mildly dilated. Left ventricular diastolic parameters are indeterminate. The average left ventricular global longitudinal strain is -14.0 %. The global longitudinal strain is abnormal.   2. Right ventricular systolic function is normal. The right ventricular size is normal. Tricuspid regurgitation signal is inadequate for assessing PA pressure.   3. Left atrial size was mildly dilated.   4. The mitral valve is normal in structure. Mild  mitral valve regurgitation. No evidence of mitral stenosis.   5. The aortic valve is normal in structure. Aortic valve regurgitation is trivial. Mild aortic valve sclerosis is present, with no evidence of aortic valve stenosis.   ASSESSMENT AND PLAN:  #) CRT-D in situ #) PVCs #) HFmrEF #) CAD s/p recent PCI Device functioning well, see paceart for details Unclear whether fatigue is d/t CAD or mexiletine, will continue mexitil at this time BiV pacing remains low at 87% Slight device adjustments as above, increased PVARP      Current medicines are reviewed at length with the patient today.   The patient has concerns regarding his medicines.  The following changes were made today:  none  Labs/ tests ordered today include:  No orders of the defined types were placed in this encounter.    Disposition: Follow up with Dr. Lalla Brothers or EP APP in 6 months   Signed, Sherie Don, NP  01/16/23  10:56 AM  Electrophysiology [facility]

## 2023-01-16 ENCOUNTER — Ambulatory Visit: Payer: PPO | Admitting: Physician Assistant

## 2023-01-16 ENCOUNTER — Ambulatory Visit: Payer: PPO | Attending: Cardiology | Admitting: Cardiology

## 2023-01-16 ENCOUNTER — Encounter: Payer: Self-pay | Admitting: Physician Assistant

## 2023-01-16 ENCOUNTER — Encounter: Payer: Self-pay | Admitting: Cardiology

## 2023-01-16 VITALS — BP 100/68 | HR 70 | Ht 66.0 in | Wt 180.0 lb

## 2023-01-16 DIAGNOSIS — I493 Ventricular premature depolarization: Secondary | ICD-10-CM | POA: Diagnosis not present

## 2023-01-16 DIAGNOSIS — I4891 Unspecified atrial fibrillation: Secondary | ICD-10-CM

## 2023-01-16 DIAGNOSIS — I2510 Atherosclerotic heart disease of native coronary artery without angina pectoris: Secondary | ICD-10-CM | POA: Diagnosis not present

## 2023-01-16 DIAGNOSIS — I480 Paroxysmal atrial fibrillation: Secondary | ICD-10-CM | POA: Diagnosis not present

## 2023-01-16 DIAGNOSIS — I6523 Occlusion and stenosis of bilateral carotid arteries: Secondary | ICD-10-CM

## 2023-01-16 DIAGNOSIS — Z95810 Presence of automatic (implantable) cardiac defibrillator: Secondary | ICD-10-CM | POA: Diagnosis not present

## 2023-01-16 DIAGNOSIS — I5022 Chronic systolic (congestive) heart failure: Secondary | ICD-10-CM

## 2023-01-16 DIAGNOSIS — I10 Essential (primary) hypertension: Secondary | ICD-10-CM | POA: Diagnosis not present

## 2023-01-16 DIAGNOSIS — Z951 Presence of aortocoronary bypass graft: Secondary | ICD-10-CM | POA: Diagnosis not present

## 2023-01-16 DIAGNOSIS — E785 Hyperlipidemia, unspecified: Secondary | ICD-10-CM | POA: Diagnosis not present

## 2023-01-16 DIAGNOSIS — Z789 Other specified health status: Secondary | ICD-10-CM

## 2023-01-16 DIAGNOSIS — I739 Peripheral vascular disease, unspecified: Secondary | ICD-10-CM | POA: Diagnosis not present

## 2023-01-16 DIAGNOSIS — I447 Left bundle-branch block, unspecified: Secondary | ICD-10-CM

## 2023-01-16 LAB — CUP PACEART INCLINIC DEVICE CHECK
Battery Remaining Longevity: 33 mo
Brady Statistic RA Percent Paced: 83 %
Brady Statistic RV Percent Paced: 87 %
Date Time Interrogation Session: 20241118124001
HighPow Impedance: 61.875
Implantable Lead Connection Status: 753985
Implantable Lead Connection Status: 753985
Implantable Lead Connection Status: 753985
Implantable Lead Implant Date: 20220124
Implantable Lead Implant Date: 20220124
Implantable Lead Implant Date: 20220124
Implantable Lead Location: 753858
Implantable Lead Location: 753859
Implantable Lead Location: 753860
Implantable Pulse Generator Implant Date: 20220124
Lead Channel Impedance Value: 475 Ohm
Lead Channel Impedance Value: 487.5 Ohm
Lead Channel Impedance Value: 500 Ohm
Lead Channel Pacing Threshold Amplitude: 0.5 V
Lead Channel Pacing Threshold Amplitude: 0.5 V
Lead Channel Pacing Threshold Amplitude: 1 V
Lead Channel Pacing Threshold Amplitude: 1 V
Lead Channel Pacing Threshold Amplitude: 2 V
Lead Channel Pacing Threshold Amplitude: 2 V
Lead Channel Pacing Threshold Amplitude: 2.625 V
Lead Channel Pacing Threshold Pulse Width: 0.5 ms
Lead Channel Pacing Threshold Pulse Width: 0.5 ms
Lead Channel Pacing Threshold Pulse Width: 0.5 ms
Lead Channel Pacing Threshold Pulse Width: 0.5 ms
Lead Channel Pacing Threshold Pulse Width: 0.5 ms
Lead Channel Pacing Threshold Pulse Width: 0.5 ms
Lead Channel Pacing Threshold Pulse Width: 0.5 ms
Lead Channel Sensing Intrinsic Amplitude: 12 mV
Lead Channel Sensing Intrinsic Amplitude: 2.3 mV
Lead Channel Setting Pacing Amplitude: 1.75 V
Lead Channel Setting Pacing Amplitude: 2 V
Lead Channel Setting Pacing Amplitude: 2.5 V
Lead Channel Setting Pacing Pulse Width: 0.5 ms
Lead Channel Setting Pacing Pulse Width: 0.5 ms
Lead Channel Setting Sensing Sensitivity: 0.5 mV
Pulse Gen Serial Number: 810017138
Zone Setting Status: 755011

## 2023-01-16 NOTE — Patient Instructions (Signed)
Medication Instructions:  Your Physician recommend you continue on your current medication as directed.    *If you need a refill on your cardiac medications before your next appointment, please call your pharmacy*   Lab Work: None ordered  Testing/Procedures: Your physician has requested that you have an LIMITED echocardiogram in 2 months. Echocardiography is a painless test that uses sound waves to create images of your heart. It provides your doctor with information about the size and shape of your heart and how well your heart's chambers and valves are working.   You may receive an ultrasound enhancing agent through an IV if needed to better visualize your heart during the echo. This procedure takes approximately one hour.  There are no restrictions for this procedure.  This will take place at [address] [facility] [address] [facility], [place] [address]  Please note: We ask at that you not bring children with you during ultrasound (echo/ vascular) testing. Due to room size and safety concerns, children are not allowed in the ultrasound rooms during exams. Our front office staff cannot provide observation of children in our lobby area while testing is being conducted. An adult accompanying a patient to their appointment will only be allowed in the ultrasound room at the discretion of the ultrasound technician under special circumstances. We apologize for any inconvenience.    Follow-Up: At [facility], you and your health needs are our priority.  As part of our continuing mission to provide you with exceptional heart care, we have created designated Provider Care Teams.  These Care Teams include your primary Cardiologist (physician) and Advanced Practice Providers (APPs -  Physician Assistants and Nurse Practitioners) who all work together to provide you with the care you need, when you need it.  We recommend signing up for the patient portal called "MyChart".  Sign up  information is provided on this After Visit Summary.  MyChart is used to connect with patients for Virtual Visits (Telemedicine).  Patients are able to view lab/test results, encounter notes, upcoming appointments, etc.  Non-urgent messages can be sent to your provider as well.   To learn more about what you can do with MyChart, go to [email or web address].    Your next appointment:   1-2 week(s) after echo in 2 months  Provider:   You may see Yvonne Kendall, MD or one of the following Advanced Practice Providers on your designated Care Team:   Eula Listen, [place]

## 2023-01-16 NOTE — Progress Notes (Signed)
Cardiology Office Note    Date:  01/16/2023   ID:  [name], DOB 1950-08-08, MRN [id number]  PCP:  [name], MD  Cardiologist:  Yvonne Kendall, MD  Electrophysiologist:  Lanier Prude, MD   Chief Complaint: Follow up  History of Present Illness:   [name] is a 72 y.o. 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   History of CAD with NSTEMI in 2017 s/p PCI to the LCx. He underwent repeat LHC in 10/2017 with widely patent LCx without significant restenosis and otherwise mild to moderate CAD.  EF 40-45%. Echo showed EF 45-50%. Repeat limited echo in 01/2018 showed an EF of 40-45%. Medical management advised. He was seen in clinic in 09/2019 with DOE and abnormal EKG. Subsequent LHC on 11/05/2019 showed multivessel CAD with development of severe distal LMCA disease. Repeat echo revealed EF 25-30%, LV severely dilated, and mild MR. He was transferred to [facility] for and underwent 4-vessel CABG (LIMA to LAD, SVG to OM1, SVG to OM2, SVG to PDA) with placement of Impella 5.5 on 11/11/2019 (removed on postoperative day 4). He did have postoperative Afib with sinus rhythm restored once started on IV amiodarone and transition to oral amiodarone and apixaban at discharge.  Amiodarone and digoxin previously discontinued due to nausea and supratherapeutic medication  level, respectively.  Repeat echo in 02/2020 showed an EF of 30 to 35%, goal hypokinesis with septal lateral dyssynchrony in the setting of LBBB, reduced RV systolic function, mild LVH, and grade 1 diastolic dysfunction.  Given his persistently low EF in the setting of chronic LBBB he underwent CRT-D placement in 2022.  GDMT is also previously been limited by hyperkalemia and concern that his GDMT was making him feel worse than he did before CABG.  Following CRT-D placement, repeat echo in 07/2020 showed low normal LV systolic function with an EF of 50 to 55%, no regional wall motion abnormalities, mildly dilated LV internal cavity size, normal RV systolic function and ventricular cavity size, mildly dilated left atrium, mild mitral regurgitation, and mild aortic valve sclerosis without evidence of stenosis.  With regards to his CRT-D, EP notes he historically has low BiV pacing due to PVCs leading to reinitiation of amiodarone, though this was ultimately discontinued due to off target effects reporting he generally did not feel well on the medication.  In this setting, he was initiated on mexiletine in 11/2021 in an effort to suppress PVCs.  Device interrogation in 05/2022 with 86% BiV pacing.  In this setting, echo was recommended to assess EF with PVC burden.  Most recent device interrogation from 08/2022 showed a less than 1% A-fib burden with 82% BiV pacing.  Echo on 12/13/2022 at showed an EF of 40 to 45%, mild global hypokinesis with moderate inferior wall hypokinesis, mildly dilated LV internal cavity size, grade 1 diastolic dysfunction, normal RV systolic function and  ventricular cavity size, mildly dilated left atrium, mild mitral regurgitation, mild aortic insufficiency, and mild dilatation of the ascending aorta measuring 42 mm (aortic valve documented to be tricuspid).   With regards to his PAD and carotid artery disease, most recent ABI from 11/2022 were normal bilaterally with decreased TBI.  Lower  extremity arterial ultrasound showed patent left femoral below-knee popliteal artery bypass graft without evidence of hemodynamically significant stenosis.  Carotid artery ultrasound in 11/2022 showed 1 to 39% right-sided ICA stenosis and 40 to 59% left ICA stenosis, which was improved from study dated 04/2022.  He was last seen in the office on 12/15/2022 and was without symptoms of angina or cardiac decompensation.  He did continue to note generalized malaise and fatigue and reported symptoms were more noticeable following the initiation of mexiletine.  In the setting of his progressive cardiomyopathy he underwent R/LHC on 12/27/2022 that showed severe native vessel CAD including 80% distal left main stenosis and chronic total occlusions of D2 and OM1.  The culprit for his symptoms and progressive cardiomyopathy was felt to likely be sequential 50% and 90% stenoses in the proximal/mid RCA.  Widely patent LIMA to D2 and SVG Y graft to OM1 and OM 4.  Chronically occluded SVG to RPDA.  Upper normal to mildly elevated left and pulmonary artery pressures with mildly elevated right heart filling pressures and normal cardiac output/index.  He underwent successful PCI/DES to the proximal/mid RCA.  He comes in accompanied by his wife today and is doing well from a cardiac perspective, without symptoms of angina or cardiac decompensation.  Fatigue is somewhat improved when compared to last visit.  No dizziness, presyncope, or syncope.  No lower extremity swelling or progressive orthopnea.  Was able to play golf yesterday and is a little tired from this.  He wonders if he got a little dehydrated.  No falls, hematochezia, or melena.  Tolerating aspirin and clopidogrel.  Has follow-up with EP later this morning.   Labs independently reviewed: 11/2022 - LP(a) 44.4, Hgb 13.9, PLT 145, potassium 3.8, BUN 24, serum creatinine 0.89 02/2022 - TSH normal, TC 135, TG 120, HDL 55, LDL 59, albumin 4.4, AST/ALT normal, Hgb 14.1,  PLT 200 10/2019 - A1c 5.6  Past Medical History:  Diagnosis Date   Abnormal nuclear cardiac imaging test 08/08/2015   AICD (automatic cardioverter/defibrillator) present    pacemaker/defib [employer]   Arthritis    fingers   Carotid artery occlusion    CHF (congestive heart failure) (HCC)    Coronary atherosclerosis of native coronary artery 01/29/2013   11/05/19 R/LHC 80% dLMCA stenosis small diffusely dz dLAD, chronically occluded OM1 50%mRCA lesion, widely patent mLCx strent, moderately elevated L heart filling pressures, mild to moderate RH filling pressures, normal to moderately reduced CO   Duodenal erosion    Encounter for screening for lung cancer 07/13/2016   Esophageal stenosis    esophageal dilation   GERD (gastroesophageal reflux disease)    H. 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 Cardiac catheterization-nonobstructive 60% RCA disease-subsequent nuclear stress test-9 minutes, low risk, mild inferior wall hypokinesis    Pain in limb 12/19/2017   Peripheral vascular disease (HCC)    Unstable angina (HCC) 11/25/2017    Past Surgical History:  Procedure Laterality Date  ABDOMINAL AORTOGRAM W/LOWER EXTREMITY Left 01/24/2022   Procedure: ABDOMINAL AORTOGRAM W/LOWER EXTREMITY;  Surgeon: Maeola Harman, MD;  Location: [facility] INVASIVE CV LAB;  Service: Cardiovascular;  Laterality: Left;   APPENDECTOMY     BACK SURGERY     BIV ICD INSERTION CRT-D N/A 03/23/2020   Procedure: BIV ICD INSERTION CRT-D;  Surgeon: Lanier Prude, MD;  Location: [facility] INVASIVE CV LAB;  Service: Cardiovascular;  Laterality: N/A;   CARDIAC CATHETERIZATION N/A 08/07/2015   Procedure: Left Heart Cath and Coronary Angiography;  Surgeon: Jake Bathe, MD;  Location: [facility] INVASIVE CV LAB;  Service:  Cardiovascular;  Laterality: N/A;   CARDIAC CATHETERIZATION N/A 08/07/2015   Procedure: Coronary Stent Intervention;  Surgeon: Jake Bathe, MD;  Location: [facility] INVASIVE CV LAB;  Service: Cardiovascular;  Laterality: N/A;   CARDIAC CATHETERIZATION N/A 08/07/2015   Procedure: Coronary Stent Intervention;  Surgeon: Peter M Swaziland, MD;  Location: [facility] INVASIVE CV LAB;  Service: Cardiovascular;  Laterality: N/A;   CAROTID ENDARTERECTOMY  01/05/2006   Right  CEA with DPA   CATARACT EXTRACTION W/ INTRAOCULAR LENS IMPLANT Left 12/04/2017   CATARACT EXTRACTION W/PHACO Left 12/04/2017   Procedure: CATARACT EXTRACTION PHACO AND INTRAOCULAR LENS PLACEMENT (IOC) LEFT;  Surgeon: Nevada Crane, MD;  Location: [facility] SURGERY CNTR;  Service: Ophthalmology;  Laterality: Left;   CATARACT EXTRACTION W/PHACO Right 02/06/2018   Procedure: CATARACT EXTRACTION PHACO AND INTRAOCULAR LENS PLACEMENT (IOC)RIGHT;  Surgeon: Nevada Crane, MD;  Location: [facility] SURGERY CNTR;  Service: Ophthalmology;  Laterality: Right;   COLONOSCOPY  05/20/2008   COLONOSCOPY WITH PROPOFOL N/A 09/13/2018   Procedure: COLONOSCOPY WITH BIOPSY;  Surgeon: Midge Minium, MD;  Location: [facility] SURGERY CNTR;  Service: Endoscopy;  Laterality: N/A;   CORONARY ARTERY BYPASS GRAFT N/A 11/11/2019   Procedure: CORONARY ARTERY BYPASS GRAFTING (CABG) USING LIMA to Diag1; ENDOSCOPICALLY HARVESTED RIGHT GREATER SAPHENOUS VEIN: SVG to OM1; SVG to OM2; SVG to PDA.;  Surgeon: Kerin Perna, MD;  Location: [facility] OR;  Service: Open Heart Surgery;  Laterality: N/A;   CORONARY STENT INTERVENTION N/A 12/27/2022   Procedure: CORONARY STENT INTERVENTION;  Surgeon: Yvonne Kendall, MD;  Location: [facility] INVASIVE CV LAB;  Service: Cardiovascular;  Laterality: N/A;   CORONARY STENT PLACEMENT  08/07/2015   MID CIRCUMFLEX   ENDARTERECTOMY FEMORAL Bilateral 09/30/2020   Procedure: ENDARTERECTOMY FEMORAL;  Surgeon: Annice Needy, MD;  Location: [facility] ORS;  Service: Vascular;   Laterality: Bilateral;   ENDARTERECTOMY POPLITEAL Left 01/25/2022   Procedure: ENDARTERECTOMY POPLITEAL;  Surgeon: Maeola Harman, MD;  Location: [facility] OR;  Service: Vascular;  Laterality: Left;   ENDOVEIN HARVEST OF GREATER SAPHENOUS VEIN Right 11/11/2019   Procedure: ENDOVEIN HARVEST OF GREATER SAPHENOUS VEIN;  Surgeon: Kerin Perna, MD;  Location: [facility] OR;  Service: Open Heart Surgery;  Laterality: Right;   ESOPHAGOGASTRODUODENOSCOPY (EGD) WITH PROPOFOL N/A 02/11/2019   Procedure: ESOPHAGOGASTRODUODENOSCOPY (EGD) WITH BIOPSY and  Dilation;  Surgeon: Midge Minium, MD;  Location: [facility] SURGERY CNTR;  Service: Endoscopy;  Laterality: N/A;   ESOPHAGOGASTRODUODENOSCOPY (EGD) WITH PROPOFOL N/A 12/23/2021   Procedure: ESOPHAGOGASTRODUODENOSCOPY (EGD) WITH PROPOFOL;  Surgeon: Midge Minium, MD;  Location: [facility] ENDOSCOPY;  Service: Endoscopy;  Laterality: N/A;   FEMORAL-FEMORAL BYPASS GRAFT Left 01/25/2022   Procedure: left Femoral to below the knee Popliteal bypass.;  Surgeon: Maeola Harman, MD;  Location: [facility] OR;  Service: Vascular;  Laterality: Left;   HIP SURGERY Left 10/2016   left hip tendon repair   LEFT HEART CATH AND CORONARY  ANGIOGRAPHY N/A 11/27/2017   Procedure: LEFT HEART CATH AND CORONARY ANGIOGRAPHY;  Surgeon: Iran Ouch, MD;  Location: [facility] INVASIVE CV LAB;  Service: Cardiovascular;  Laterality: N/A;   LOWER EXTREMITY ANGIOGRAPHY Left 02/12/2018   Procedure: LOWER EXTREMITY ANGIOGRAPHY;  Surgeon: Annice Needy, MD;  Location: [facility] INVASIVE CV LAB;  Service: Cardiovascular;  Laterality: Left;   LOWER EXTREMITY ANGIOGRAPHY Left 03/07/2018   Procedure: LOWER EXTREMITY ANGIOGRAPHY;  Surgeon: Annice Needy, MD;  Location: [facility] INVASIVE CV LAB;  Service: Cardiovascular;  Laterality: Left;   LOWER EXTREMITY ANGIOGRAPHY Left 06/04/2018   Procedure: LOWER EXTREMITY ANGIOGRAPHY;  Surgeon: Annice Needy, MD;  Location: [facility] INVASIVE CV LAB;  Service: Cardiovascular;  Laterality:  Left;   LOWER EXTREMITY ANGIOGRAPHY Left 09/17/2020   Procedure: LOWER EXTREMITY ANGIOGRAPHY;  Surgeon: Annice Needy, MD;  Location: [facility] INVASIVE CV LAB;  Service: Cardiovascular;  Laterality: Left;   LOWER EXTREMITY ANGIOGRAPHY Left 09/28/2020   Procedure: LOWER EXTREMITY ANGIOGRAPHY;  Surgeon: Annice Needy, MD;  Location: [facility] INVASIVE CV LAB;  Service: Cardiovascular;  Laterality: Left;   LOWER EXTREMITY ANGIOGRAPHY N/A 02/15/2021   Procedure: LOWER EXTREMITY ANGIOGRAPHY;  Surgeon: Annice Needy, MD;  Location: [facility] INVASIVE CV LAB;  Service: Cardiovascular;  Laterality: N/A;   LOWER EXTREMITY ANGIOGRAPHY Left 12/27/2021   Procedure: Lower Extremity Angiography;  Surgeon: Annice Needy, MD;  Location: [facility] INVASIVE CV LAB;  Service: Cardiovascular;  Laterality: Left;   LOWER EXTREMITY ANGIOGRAPHY Left 12/28/2021   Procedure: Lower Extremity Angiography;  Surgeon: Annice Needy, MD;  Location: [facility] INVASIVE CV LAB;  Service: Cardiovascular;  Laterality: Left;   PLACEMENT OF IMPELLA LEFT VENTRICULAR ASSIST DEVICE N/A 11/11/2019   Procedure: PLACEMENT OF IMPELLA LEFT VENTRICULAR ASSIST DEVICE 5.5;  Surgeon: Kerin Perna, MD;  Location: [facility] OR;  Service: Open Heart Surgery;  Laterality: N/A;  Midline Sternotomy   POLYPECTOMY N/A 09/13/2018   Procedure: POLYPECTOMY;  Surgeon: Midge Minium, MD;  Location: [facility] SURGERY CNTR;  Service: Endoscopy;  Laterality: N/A;   POLYPECTOMY N/A 02/11/2019   Procedure: POLYPECTOMY;  Surgeon: Midge Minium, MD;  Location: [facility] SURGERY CNTR;  Service: Endoscopy;  Laterality: N/A;   REMOVAL OF IMPELLA LEFT VENTRICULAR ASSIST DEVICE N/A 11/15/2019   Procedure: REMOVAL OF IMPELLA 5.5 LEFT VENTRICULAR ASSIST DEVICE;  Surgeon: Kerin Perna, MD;  Location: [facility] OR;  Service: Open Heart Surgery;  Laterality: N/A;   RIGHT/LEFT HEART CATH AND CORONARY ANGIOGRAPHY N/A 11/05/2019   Procedure: RIGHT/LEFT HEART CATH AND CORONARY ANGIOGRAPHY;  Surgeon: Yvonne Kendall, MD;   Location: [facility] INVASIVE CV LAB;  Service: Cardiovascular;  Laterality: N/A;   RIGHT/LEFT HEART CATH AND CORONARY/GRAFT ANGIOGRAPHY Bilateral 12/27/2022   Procedure: RIGHT/LEFT HEART CATH AND CORONARY/GRAFT ANGIOGRAPHY;  Surgeon: Yvonne Kendall, MD;  Location: [facility] INVASIVE CV LAB;  Service: Cardiovascular;  Laterality: Bilateral;   SPINE SURGERY     TEE WITHOUT CARDIOVERSION N/A 11/11/2019   Procedure: TRANSESOPHAGEAL ECHOCARDIOGRAM (TEE);  Surgeon: Donata Clay, Theron Arista, MD;  Location: [facility] OR;  Service: Open Heart Surgery;  Laterality: N/A;   TEE WITHOUT CARDIOVERSION N/A 11/15/2019   Procedure: TRANSESOPHAGEAL ECHOCARDIOGRAM (TEE);  Surgeon: Donata Clay, Theron Arista, MD;  Location: [facility] OR;  Service: Open Heart Surgery;  Laterality: N/A;   TONSILLECTOMY      Current Medications: No outpatient medications have been marked as taking for the 01/16/23 encounter (Office Visit) with Ronald Barges, PA-C.    Allergies:   Brilinta [ticagrelor], Chlorhexidine gluconate, Contrast media [iodinated contrast media], Statins, and Zetia [  ezetimibe]   Social History   Socioeconomic History   Marital status: Married    Spouse name: Not on file   Number of children: 1   Years of education: Not on file   Highest education level: Bachelor's degree (e.g., BA, AB, BS)  Occupational History   Not on file  Tobacco Use   Smoking status: Former    Current packs/day: 0.00    Average packs/day: 1.3 packs/day for 35.0 years (43.8 ttl pk-yrs)    Types: Cigarettes    Start date: 02/28/1970    Quit date: 02/28/2005    Years since quitting: 17.8    Passive exposure: Never   Smokeless tobacco: Current    Types: Snuff   Tobacco comments:    occaisionally  Vaping Use   Vaping status: Never Used  Substance and Sexual Activity   Alcohol use: Yes    Alcohol/week: 10.0 standard drinks of alcohol    Types: 10 Standard drinks or equivalent per week    Comment: wine/liquor weekly   Drug use: No   Sexual activity: Yes  Other  Topics Concern   Not on file  Social History Narrative   Not on file   Social Determinants of Health   Financial Resource Strain: Low Risk  (12/06/2022)   Overall Financial Resource Strain (CARDIA)    Difficulty of Paying Living Expenses: Not hard at all  Food Insecurity: No Food Insecurity (12/06/2022)   Hunger Vital Sign    Worried About Running Out of Food in the Last Year: Never true    Ran Out of Food in the Last Year: Never true  Transportation Needs: No Transportation Needs (12/06/2022)   PRAPARE - Administrator, Civil Service (Medical): No    Lack of Transportation (Non-Medical): No  Physical Activity: Insufficiently Active (12/06/2022)   Exercise Vital Sign    Days of Exercise per Week: 3 days    Minutes of Exercise per Session: 30 min  Stress: No Stress Concern Present (12/06/2022)   [employer] of Occupational Health - Occupational Stress Questionnaire    Feeling of Stress : Not at all  Social Connections: Moderately Integrated (12/06/2022)   Social Connection and Isolation Panel [NHANES]    Frequency of Communication with Friends and Family: More than three times a week    Frequency of Social Gatherings with Friends and Family: Not on file    Attends Religious Services: Never    Database administrator or Organizations: Yes    Attends Engineer, structural: More than 4 times per year    Marital Status: Married     Family History:  The patient's family history includes Cancer (age of onset: 59) in his father; Coronary artery disease in his mother; Diabetes in his mother; Heart attack in his father and mother; Heart disease in his father and mother; Hypertension in his father and mother; Stroke in his father. There is no history of Colon cancer, Colon polyps, Esophageal cancer, Rectal cancer, or Stomach cancer.  ROS:   12-point review of systems is negative unless otherwise noted in the HPI.   EKGs/Labs/Other Studies Reviewed:    Studies  reviewed were summarized above. The additional studies were reviewed today:  [facility] 12/27/2022: Conclusions: Severe native coronary artery disease, including 80% distal LMCA stenosis and chronic total occlusions of D2 and OM1.  Culprit disease for the patient's recurrent atypical angina and worsening LVEF is likely sequential 50% and 90% stenoses in the proximal/mid RCA. Widely patent LIMA-D2  and SVG Y-graft to OM1 and OM4. Chronically occluded SVG-RPDA. Upper normal to mildly elevated left heart and pulmonary artery pressures. Mildly elevated right heart filling pressure. Normal Fick cardiac output/index. Successful PCI to proximal/mid RCA using Onyx Frontier 3.0 x 30 mm drug-eluting stent (postdilated to 3.6 mm) with 0% residual stenosis and TIMI-3 flow.   Recommendations: Overnight observation. Dual antiplatelet therapy with aspirin and clopidogrel for at least 6 months, potentially longer.  Consider resumption of apixaban with history of PAF at the discretion of electrophysiology (upcoming visit next month).  If apixaban is restarted, would recommend discontinuation of aspirin. Aggressive secondary prevention of coronary artery disease.  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 Continue outpatient escalation of goal-directed medical therapy for heart failure with mildly reduced ejection fraction as tolerated. __________  2D echo 12/13/2022: 1. Left ventricular ejection fraction, by estimation, is 40 to 45%. Left  ventricular ejection fraction by 3D volume is 43 %. The left ventricle has  mildly decreased function. The left ventricle demonstrates mild global  hypokinesis with moderate inferior   wall hypokinesis. The left ventricular internal cavity size was mildly  dilated. Left ventricular diastolic parameters are consistent with Grade I  diastolic dysfunction (impaired relaxation). The average left ventricular  global longitudinal strain  is  -9.5 %.   2. Right ventricular systolic function is normal. The right ventricular  size is normal.   3. Left atrial size was mildly dilated.   4. The mitral valve is normal in structure. Mild mitral valve  regurgitation. No evidence of mitral stenosis.   5. The aortic valve is tricuspid. Aortic valve regurgitation is mild. No  aortic stenosis is present.   6. There is mild dilatation of the ascending aorta, measuring 42 mm.   7. The inferior vena cava is normal in size with greater than 50%  respiratory variability, suggesting right atrial pressure of 3 mmHg.  __________   2D echo 08/10/2020: 1. Left ventricular ejection fraction, by estimation, is 50 to 55%. The  left ventricle has low normal function. The left ventricle has no regional  wall motion abnormalities. The left ventricular internal cavity size was  mildly dilated. Left ventricular  diastolic parameters are indeterminate. The average left ventricular  global longitudinal strain is -14.0 %. The global longitudinal strain is  abnormal.   2. Right ventricular systolic function is normal. The right ventricular  size is normal. Tricuspid regurgitation signal is inadequate for assessing  PA pressure.   3. Left atrial size was mildly dilated.   4. The mitral valve is normal in structure. Mild mitral valve  regurgitation. No evidence of mitral stenosis.   5. The aortic valve is normal in structure. Aortic valve regurgitation is  trivial. Mild aortic valve sclerosis is present, with no evidence of  aortic valve stenosis.  __________   2D echo 03/02/2020: 1. Left ventricular ejection fraction, by estimation, is 30 to 35%. The  left ventricle has moderately decreased function. The left ventricle  demonstrates global hypokinesis with septal-lateral dyssynchrony due to  LBBB. There is mild left ventricular  hypertrophy. Left ventricular diastolic parameters are consistent with  Grade I diastolic dysfunction (impaired  relaxation).   2. The mitral valve is normal in structure. Trivial mitral valve  regurgitation. No evidence of mitral stenosis.   3. The aortic valve is tricuspid. Aortic valve regurgitation is trivial.  No aortic stenosis is present.   4. Right ventricular systolic function is mildly reduced. The right  ventricular size is normal. Tricuspid regurgitation signal is inadequate  for assessing PA pressure.   5. The inferior vena cava is normal in size with greater than 50%  respiratory variability, suggesting right atrial pressure of 3 mmHg.  __________   Intraoperative TEE 11/15/2019: POST-OP IMPRESSIONS  - Left Ventricle: The left ventricle is unchanged from pre-bypass.  - Aorta: The aorta appears unchanged from pre-bypass.  - Left Atrial Appendage: The left atrial appendage appears unchanged from  pre-bypass.  - Aortic Valve: The aortic valve appears unchanged from pre-bypass.  - Mitral Valve: The mitral valve appears unchanged from pre-bypass.  - Tricuspid Valve: The tricuspid valve appears unchanged from pre-bypass.  - Interatrial Septum: The interatrial septum appears unchanged from  pre-bypass.  - Pericardium: The pericardium appears unchanged from pre-bypass.  Impella 5.5 appropriately positioned ~4.5cm into LV. Removal under TEE  guidance  without complication. No new or worsening valvular or wall motion  abnormalities.  Patient remains with severe LV dilation with global hypokinesis. Mild  secondary  MR present unchanged from prior scan.  ___________   Limited echo 11/14/2019: 1. Left ventricular ejection fraction, by estimation, is <20%. The left  ventricle has severely decreased function. The left ventricle demonstrates  global hypokinesis. Impella catheter in LV, positioned at 4.8 cm.   2. Right ventricular systolic function is moderately reduced. The right  ventricular size is normal.   3. Limited echo for Impella position.  __________   Limited echo 11/12/2019: 1.  2D echo done for Impella placement. LV function severely reduced.  Catheter tip is 4.8cm from AV.  __________   TEE 11/11/2019:    Left ventricle: Normal wall thickness. Cavity is severely dilated. Wall motion is abnormal.   Left atrium: Left atrial appendage filling and emptying velocities are decreased.   Aortic valve: Mild valve thickening present. Mild valve calcification present. No stenosis.   Mitral valve:  Mild mitral annular calcification. Mild regurgitation. The annulus is mildy.   Right ventricle:  Normal cavity size and ejection fraction.   Tricuspid valve: Trace regurgitation.  __________   2D echo 11/06/2019: 1. Poor quality images no parasternal/subcostal images.   2. Septal apical and inferior basal akinesis hypokinesis of mid and  apical inferior wall. Left ventricular ejection fraction, by estimation,  is 25 to 30%. The left ventricle has severely decreased function. The left  ventricle demonstrates regional wall  motion abnormalities (see scoring diagram/findings for description). The  left ventricular internal cavity size was severely dilated. Left  ventricular diastolic parameters were normal.   3. Right ventricular systolic function is normal. The right ventricular  size is normal.   4. The mitral valve is normal in structure. Mild mitral valve  regurgitation. No evidence of mitral stenosis.   5. The aortic valve was not well visualized. Aortic valve regurgitation  is not visualized. Mild aortic valve sclerosis is present, with no  evidence of aortic valve stenosis.   6. The inferior vena cava is normal in size with greater than 50%  respiratory variability, suggesting right atrial pressure of 3 mmHg.  __________   [facility] 11/05/2019: Conclusions: Multivessel CAD, including calcified 80% distal LMCA stenosis, small diffusely diseased distal LAD, chronically occluded OM1, and 50% mid RCA lesion. Widely patent mid LCx stent. Moderately elevated left heart  filling pressure. Mildly to moderately elevated right heart filling pressure. Normal to mildly reduced cardiac output/index.   Recommendations: Transfer to [facility] for cardiac surgery consultation for CABG, given severe LMCA disease and low LVEF.  Hold clopidogrel pending cardiac surgery consultation. Obtain echocardiogram. Aggressive secondary prevention. __________   See CV studies in Epic for more remote imaging   EKG:  EKG is ordered today.  The EKG ordered today demonstrates V-paced rhythm, 70 bpm  Recent Labs: 03/22/2022: ALT 21; TSH 2.510 12/28/2022: BUN 24; Creatinine, Ser 0.89; Hemoglobin 13.9; Platelets 145; Potassium 3.8; Sodium 136  Recent Lipid Panel    Component Value Date/Time   CHOL 135 03/22/2022 0839   TRIG 120 03/22/2022 0839   HDL 55 03/22/2022 0839   CHOLHDL 2.5 03/22/2022 0839   CHOLHDL 2.3 01/07/2020 1601   VLDL 32 01/07/2020 1601   LDLCALC 59 03/22/2022 0839    PHYSICAL EXAM:    VS:  BP 100/68 (BP Location: Left Arm, Patient Position: Sitting, Cuff Size: Normal)   Pulse 70   Ht 5\' 6"  (1.676 m)   Wt 180 lb (81.6 kg)   SpO2 95%   BMI 29.05 kg/m   BMI: Body mass index is 29.05 kg/m.  Physical Exam Vitals reviewed.  Constitutional:      Appearance: He is well-developed.  HENT:     Head: Normocephalic and atraumatic.  Eyes:     General:        Right eye: No discharge.        Left eye: No discharge.  Neck:     Vascular: No JVD.  Cardiovascular:     Rate and Rhythm: Normal rate and regular rhythm.     Pulses:          Posterior tibial pulses are 2+ on the right side and 2+ on the left side.     Heart sounds: Normal heart sounds, S1 normal and S2 normal. Heart sounds not distant. No midsystolic click and no opening snap. No murmur heard.    No friction rub.     Comments: Left radial arteriotomy is well-healed without active bleeding, bruising, swelling, warmth, or erythema.  Radial pulse 2+ proximal and distal to the arteriotomy  site. Pulmonary:     Effort: Pulmonary effort is normal. No respiratory distress.     Breath sounds: Normal breath sounds. No decreased breath sounds, wheezing, rhonchi or rales.  Chest:     Chest wall: No tenderness.  Abdominal:     General: There is no distension.  Musculoskeletal:     Cervical back: Normal range of motion.     Right lower leg: No edema.     Left lower leg: No edema.  Skin:    General: Skin is warm and dry.     Nails: There is no clubbing.  Neurological:     Mental Status: He is alert and oriented to person, place, and time.  Psychiatric:        Speech: Speech normal.        Behavior: Behavior normal.        Thought Content: Thought content normal.        Judgment: Judgment normal.     Wt Readings from Last 3 Encounters:  01/16/23 180 lb (81.6 kg)  01/16/23 180 lb (81.6 kg)  12/27/22 181 lb 9.6 oz (82.4 kg)     ASSESSMENT & PLAN:   CAD status post CABG s/p PCI without angina: He is doing well and without symptoms of angina or cardiac decompensation.  Status post PCI/DES to the native RCA as outlined above with overall improvement in fatigue.  Continue DAPT with aspirin and clopidogrel without interruption for a minimum of 6 months, preferably longer.  Aggressive risk factor modification and secondary prevention with continuation of Praluent and carvedilol.  No indication for further ischemic testing at this time.  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  Relative hypotension precludes escalation of GDMT at this time.  Not requiring a standing loop diuretic.  Look to repeat a limited echo in 2 months following PCI to the RCA to reevaluate LV systolic function.  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  Appreciate EP assistance with comanagement.  PAF: Noted following a CABG.  After discussion  with the EP, A-fib has not been identified on device interrogations.  Therefore, patient remains off OAC.  Ongoing management per EP.  PVCs: Quiescent on EKG today.  On carvedilol and mexiletine with management per EP.  HTN: Blood pressure is well-controlled in the office today and precludes escalation of GDMT.  Continue medical therapy as outlined above.  HLD with statin intolerance: LDL 59.  Remains on Praluent 150 mg injected subcutaneously every 14 days.  PAD/carotid artery disease: No symptoms of lifestyle limiting claudication.  Remains on aspirin, now clopidogrel as outlined above, and PCSK9 inhibitor.  Followed by vascular surgery.     Disposition: F/u with Dr. Okey Dupre or an APP in 2 months, and EP as directed.    Medication Adjustments/Labs and Tests Ordered: Current medicines are reviewed at length with the patient today.  Concerns regarding medicines are outlined above. Medication changes, Labs and Tests ordered today are summarized above and listed in the Patient Instructions accessible in Encounters.   Signed, Eula Listen, PA-C 01/16/2023 1:07 PM     [facility] [place] [address] [place], [place] [address] [phone]

## 2023-01-16 NOTE — Patient Instructions (Signed)
Medication Instructions:  The current medical regimen is effective;  continue present plan and medications.  *If you need a refill on your cardiac medications before your next appointment, please call your pharmacy*   Follow-Up: At [facility], you and your health needs are our priority.  As part of our continuing mission to provide you with exceptional heart care, we have created designated Provider Care Teams.  These Care Teams include your primary Cardiologist (physician) and Advanced Practice Providers (APPs -  Physician Assistants and Nurse Practitioners) who all work together to provide you with the care you need, when you need it.  We recommend signing up for the patient portal called "MyChart".  Sign up information is provided on this After Visit Summary.  MyChart is used to connect with patients for Virtual Visits (Telemedicine).  Patients are able to view lab/test results, encounter notes, upcoming appointments, etc.  Non-urgent messages can be sent to your provider as well.   To learn more about what you can do with MyChart, go to [email or web address].    Your next appointment:   6 month(s)  Provider:   Steffanie Dunn, MD or Sherie Don, NP

## 2023-01-25 ENCOUNTER — Other Ambulatory Visit: Payer: Self-pay | Admitting: Internal Medicine

## 2023-01-25 DIAGNOSIS — E785 Hyperlipidemia, unspecified: Secondary | ICD-10-CM

## 2023-01-25 DIAGNOSIS — I2510 Atherosclerotic heart disease of native coronary artery without angina pectoris: Secondary | ICD-10-CM

## 2023-01-25 DIAGNOSIS — Z951 Presence of aortocoronary bypass graft: Secondary | ICD-10-CM

## 2023-01-31 DIAGNOSIS — M5416 Radiculopathy, lumbar region: Secondary | ICD-10-CM | POA: Diagnosis not present

## 2023-01-31 DIAGNOSIS — M48062 Spinal stenosis, lumbar region with neurogenic claudication: Secondary | ICD-10-CM | POA: Diagnosis not present

## 2023-02-23 ENCOUNTER — Emergency Department
Admission: EM | Admit: 2023-02-23 | Discharge: 2023-02-23 | Disposition: A | Discharge status: Home / Self Care | Payer: PPO | Attending: Student in an Organized Health Care Education/Training Program | Admitting: Student in an Organized Health Care Education/Training Program

## 2023-02-23 ENCOUNTER — Emergency Department: Payer: PPO

## 2023-02-23 ENCOUNTER — Other Ambulatory Visit: Payer: Self-pay

## 2023-02-23 DIAGNOSIS — Z95810 Presence of automatic (implantable) cardiac defibrillator: Secondary | ICD-10-CM | POA: Diagnosis not present

## 2023-02-23 DIAGNOSIS — R079 Chest pain, unspecified: Secondary | ICD-10-CM | POA: Diagnosis present

## 2023-02-23 DIAGNOSIS — R002 Palpitations: Secondary | ICD-10-CM | POA: Insufficient documentation

## 2023-02-23 DIAGNOSIS — I509 Heart failure, unspecified: Secondary | ICD-10-CM | POA: Insufficient documentation

## 2023-02-23 DIAGNOSIS — R0602 Shortness of breath: Secondary | ICD-10-CM | POA: Diagnosis not present

## 2023-02-23 DIAGNOSIS — R140 Abdominal distension (gaseous): Secondary | ICD-10-CM | POA: Diagnosis not present

## 2023-02-23 LAB — COMPREHENSIVE METABOLIC PANEL
ALT: 19 U/L (ref 0–44)
AST: 20 U/L (ref 15–41)
Albumin: 4 g/dL (ref 3.5–5.0)
Alkaline Phosphatase: 78 U/L (ref 38–126)
Anion gap: 9 (ref 5–15)
BUN: 28 mg/dL — ABNORMAL HIGH (ref 8–23)
CO2: 25 mmol/L (ref 22–32)
Calcium: 8.8 mg/dL — ABNORMAL LOW (ref 8.9–10.3)
Chloride: 100 mmol/L (ref 98–111)
Creatinine, Ser: 1.01 mg/dL (ref 0.61–1.24)
GFR, Estimated: >60 mL/min (ref >60)
Glucose, Bld: 97 mg/dL (ref 70–99)
Potassium: 4.3 mmol/L (ref 3.5–5.1)
Sodium: 134 mmol/L — ABNORMAL LOW (ref 135–145)
Total Bilirubin: 0.9 mg/dL (ref <1.2)
Total Protein: 7.2 g/dL (ref 6.5–8.1)

## 2023-02-23 LAB — CBC WITH DIFFERENTIAL/PLATELET
Abs Immature Granulocytes: 0.04 10*3/uL (ref 0.00–0.07)
Basophils Absolute: 0 10*3/uL (ref 0.0–0.1)
Basophils Relative: 1 %
Eosinophils Absolute: 0.4 10*3/uL (ref 0.0–0.5)
Eosinophils Relative: 8 %
HCT: 40.7 % (ref 39.0–52.0)
Hemoglobin: 14.1 g/dL (ref 13.0–17.0)
Immature Granulocytes: 1 %
Lymphocytes Relative: 28 %
Lymphs Abs: 1.3 10*3/uL (ref 0.7–4.0)
MCH: 33.7 pg (ref 26.0–34.0)
MCHC: 34.6 g/dL (ref 30.0–36.0)
MCV: 97.1 fL (ref 80.0–100.0)
Monocytes Absolute: 0.7 10*3/uL (ref 0.1–1.0)
Monocytes Relative: 15 %
Neutro Abs: 2.3 10*3/uL (ref 1.7–7.7)
Neutrophils Relative %: 47 %
Platelets: 151 10*3/uL (ref 150–400)
RBC: 4.19 MIL/uL — ABNORMAL LOW (ref 4.22–5.81)
RDW: 12.6 % (ref 11.5–15.5)
WBC: 4.8 10*3/uL (ref 4.0–10.5)
nRBC: 0 % (ref 0.0–0.2)

## 2023-02-23 LAB — BRAIN NATRIURETIC PEPTIDE: B Natriuretic Peptide: 125.9 pg/mL — ABNORMAL HIGH (ref 0.0–100.0)

## 2023-02-23 LAB — TROPONIN I (HIGH SENSITIVITY)
Troponin I (High Sensitivity): 11 ng/L (ref <18)
Troponin I (High Sensitivity): 9 ng/L (ref <18)

## 2023-02-23 NOTE — ED Triage Notes (Signed)
Pt here with cp. Pt states he had a cardiac stent placed a month ago and he has a defibrillator. Pt denies cp or sob but states he feels a flickering in her chest. Pt describes it as a flutter. Pt denies NVD.

## 2023-02-23 NOTE — ED Provider Notes (Signed)
[facility] Provider Note    Event Date/Time   First MD Initiated Contact with Patient 02/23/23 [phone]     (approximate)   History   Chest Pain   HPI  [name] is a 72 y.o. male with a history of CHF status post pacer defibrillator placement presents to the ER for evaluation of 3 days of intermittent fluttering feeling in his chest.  Denies any chest pain or shortness of breath no lower extremity swelling no orthopnea no fevers.  No exacerbating or alleviating factors.  No heavy lifting or injury.     Physical Exam   Triage Vital Signs: ED Triage Vitals  Encounter Vitals Group     BP      Systolic BP Percentile      Diastolic BP Percentile      Pulse      Resp      Temp      Temp src      SpO2      Weight      Height      Head Circumference      Peak Flow      Pain Score      Pain Loc      Pain Education      Exclude from Growth Chart     Most recent vital signs: Vitals:   02/23/23 1130 02/23/23 1200  BP: (!) 157/86 (!) 141/97  Pulse: 72 73  Resp: 15 14  Temp:    SpO2: 96% 95%     Constitutional: Alert  Eyes: Conjunctivae are normal.  Head: Atraumatic. Nose: No congestion/rhinnorhea. Mouth/Throat: Mucous membranes are moist.   Neck: Painless ROM.  Cardiovascular:   Good peripheral circulation. Respiratory: Normal respiratory effort.  No retractions.  Gastrointestinal: Soft and nontender.  Musculoskeletal:  no deformity Neurologic:  MAE spontaneously. No gross focal neurologic deficits are appreciated.  Skin:  Skin is warm, dry and intact. No rash noted. Psychiatric: Mood and affect are normal. Speech and behavior are normal.    ED Results / Procedures / Treatments   Labs (all labs ordered are listed, but only abnormal results are displayed) Labs Reviewed  CBC WITH DIFFERENTIAL/PLATELET - Abnormal; Notable for the following components:      Result Value   RBC 4.19 (*)    All other components within normal limits   COMPREHENSIVE METABOLIC PANEL - Abnormal; Notable for the following components:   Sodium 134 (*)    BUN 28 (*)    Calcium 8.8 (*)    All other components within normal limits  BRAIN NATRIURETIC PEPTIDE - Abnormal; Notable for the following components:   B Natriuretic Peptide 125.9 (*)    All other components within normal limits  TROPONIN I (HIGH SENSITIVITY)  TROPONIN I (HIGH SENSITIVITY)     EKG  ED ECG REPORT I, Willy Eddy, the attending physician, personally viewed and interpreted this ECG.   Date: 02/23/2023  EKG Time: 8:37  Rate: 75  Rhythm: paced  Axis: left  Intervals: paced  ST&T Change: nonspecific st abn    RADIOLOGY .Please see ED Course for my review and interpretation.  I personally reviewed all radiographic images ordered to evaluate for the above acute complaints and reviewed radiology reports and findings.  These findings were personally discussed with the patient.  Please see medical record for radiology report.    PROCEDURES:  Critical Care performed:   Procedures   MEDICATIONS ORDERED IN ED: Medications - No data  to display   IMPRESSION / MDM / ASSESSMENT AND PLAN / ED COURSE  I reviewed the triage vital signs and the nursing notes.                              Differential diagnosis includes, but is not limited to, dysrhythmia, pacemaker firing, ACS, CHF, muscle spasm, electrolyte abnormality  Patient presenting to the ER for evaluation of symptoms as described above.  Based on symptoms, risk factors and considered above differential, this presenting complaint could reflect a potentially life-threatening illness therefore the patient will be placed on continuous pulse oximetry and telemetry for monitoring.  Laboratory evaluation will be sent to evaluate for the above complaints.      Clinical Course as of 02/23/23 1251  Thu Feb 23, 2023  1610 Patient cardiac normal.  BNP not significantly elevated.  Chest x-ray on my review and  interpretation without evidence of edema or pneumothorax.  Will interrogate pacer. [PR]  1250 Discussed patient's pacer interrogation results with his clinic.  Patient is not having any therapies delivered.  No sign of dysrhythmia or tachycardia.  Fluid level normal.  Patient's workup here in the ER is reassuring.  Does appear stable appropriate for outpatient follow-up. [PR]    Clinical Course User Index [PR] Willy Eddy, MD     FINAL CLINICAL IMPRESSION(S) / ED DIAGNOSES   Final diagnoses:  Palpitations     Rx / DC Orders   ED Discharge Orders     None        Note:  This document was prepared using Dragon voice recognition software and may include unintentional dictation errors.    Willy Eddy, MD 02/23/23 [phone]

## 2023-02-27 DIAGNOSIS — M48062 Spinal stenosis, lumbar region with neurogenic claudication: Secondary | ICD-10-CM | POA: Diagnosis not present

## 2023-02-27 DIAGNOSIS — M5416 Radiculopathy, lumbar region: Secondary | ICD-10-CM | POA: Diagnosis not present

## 2023-03-09 ENCOUNTER — Encounter: Payer: Self-pay | Admitting: Internal Medicine

## 2023-03-09 ENCOUNTER — Ambulatory Visit (INDEPENDENT_AMBULATORY_CARE_PROVIDER_SITE_OTHER): Payer: Self-pay | Admitting: Internal Medicine

## 2023-03-09 VITALS — BP 80/54 | HR 74 | Temp 98.1°F | Ht 66.0 in | Wt 183.0 lb

## 2023-03-09 DIAGNOSIS — G720 Drug-induced myopathy: Secondary | ICD-10-CM | POA: Diagnosis not present

## 2023-03-09 DIAGNOSIS — J069 Acute upper respiratory infection, unspecified: Secondary | ICD-10-CM

## 2023-03-09 DIAGNOSIS — T466X5A Adverse effect of antihyperlipidemic and antiarteriosclerotic drugs, initial encounter: Secondary | ICD-10-CM

## 2023-03-09 DIAGNOSIS — I493 Ventricular premature depolarization: Secondary | ICD-10-CM

## 2023-03-09 NOTE — Progress Notes (Signed)
 Date:  03/09/2023   Name:  [name]   DOB:  1951-01-06   MRN:  [id number]   Chief Complaint: Cough and Heart Problem (X2-3 weeks,Heart fluttering went to ER they said he was ok, comes and goes, no pain )  Cough This is a new problem. Episode onset: 1 week. The problem has been unchanged. The problem occurs every few minutes. The cough is Productive of sputum (white mucous). Associated symptoms include headaches, nasal congestion, rhinorrhea and a sore throat. Pertinent negatives include no chills, ear pain, fever, shortness of breath, weight loss or wheezing. Nothing aggravates the symptoms. He has tried OTC cough suppressant for the symptoms. The treatment provided moderate relief. CAD and Pacer/ICD - seen in ER with EKG - PVCs  Palpitations  This is a recurrent problem. The problem occurs daily. The problem has been unchanged. Nothing aggravates the symptoms. Associated symptoms include coughing. Pertinent negatives include no anxiety, dizziness, fever, nausea, shortness of breath, vomiting or weakness. CAD and Pacer/ICD - seen in ER with EKG - PVCs    Medication list has been reviewed and updated.  Current Meds  Medication Sig   Alirocumab  (PRALUENT ) 150 MG/ML SOAJ INJECT 1 ML (150 MG TOTAL) INTO THE SKIN EVERY 14 (FOURTEEN) DAYS.   aspirin  EC 81 MG tablet Take 1 tablet (81 mg total) by mouth daily.   carvedilol  (COREG ) 12.5 MG tablet TAKE (1) TABLET BY MOUTH TWICE DAILY.   clopidogrel  (PLAVIX ) 75 MG tablet Take 1 tablet (75 mg total) by mouth daily with breakfast.   colchicine  0.6 MG tablet TAKE 1 TABLET (0.6 MG TOTAL) BY MOUTH 2 (TWO) TIMES DAILY AS NEEDED (GOUT ATTACKS).   methocarbamol (ROBAXIN) 500 MG tablet Take 500 mg by mouth. 1 po q occasionally as needed for pain/spasm. No more than one every few days.   mexiletine (MEXITIL ) 150 MG capsule TAKE 1 CAP BY MOUTH TWICE DAILY   nitroGLYCERIN  (NITROSTAT ) 0.4 MG SL tablet Place 1 tablet (0.4 mg total) under the tongue every 5  (five) minutes as needed for chest pain.   oxyCODONE -acetaminophen  (PERCOCET) 5-325 MG tablet Take 1 tablet by mouth every 8 (eight) hours as needed.   sacubitril -valsartan  (ENTRESTO ) 49-51 MG Take 1 tablet by mouth 2 (two) times daily.   traMADol  (ULTRAM ) 50 MG tablet Take by mouth.   traZODone  (DESYREL ) 50 MG tablet Take 1 tablet (50 mg total) by mouth at bedtime as needed. for sleep     Review of Systems  Constitutional:  Positive for fatigue. Negative for chills, fever and weight loss.  HENT:  Positive for rhinorrhea and sore throat. Negative for ear pain.   Respiratory:  Positive for cough. Negative for chest tightness, shortness of breath and wheezing.   Cardiovascular:  Positive for palpitations.  Gastrointestinal:  Negative for diarrhea, nausea and vomiting.  Neurological:  Positive for headaches. Negative for dizziness and weakness.  Psychiatric/Behavioral:  Negative for dysphoric mood and sleep disturbance. The patient is not nervous/anxious.     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  Accelerating angina (HCC) 11/05/2019   Left bundle branch block 11/05/2019   Stricture and stenosis of esophagus    Polyp of colon    Rectal polyp    CAD, multiple vessel 08/13/2018   Carotid stenosis 07/17/2018   Atherosclerosis of artery of extremity with rest pain (HCC) 06/04/2018   Chronic HFrEF (heart failure with reduced ejection fraction) (HCC) 03/12/2018   Dilated cardiomyopathy (HCC) 12/08/2017   Myalgia due to statin 12/08/2017   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) 05/01/2017   Foot pain, bilateral 05/01/2017   Degenerative disc disease, 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   History of tobacco use 07/13/2016   Overweight (BMI 25.0-29.9) 07/13/2016   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HCC) 07/13/2016   Dysphagia 11/26/2015   Chronic left hip pain 09/15/2015   Essential hypertension 08/08/2015   Coronary artery disease of native artery of native heart with stable angina pectoris (HCC)    Mixed hyperlipidemia 01/29/2013   History of CEA (carotid endarterectomy) 11/08/2011    Allergies  Allergen Reactions   Brilinta  [Ticagrelor ] Shortness Of Breath   Chlorhexidine  Gluconate Other (See Comments)    Skin burning for hours afterward   Contrast Media [Iodinated Contrast Media] Itching    Face and head flushing, nose itching after contrast administation for angiogram   Statins Other (See Comments)    Failed Crestor  5 mg twice weekly, Crestor  20 mg daily, Pravastatin 40 mg qd, Lipitor, Zocor - muscle aches   Zetia [Ezetimibe] Other (See Comments)    Muscle aches    Immunization History  Administered Date(s) Administered   Fluad Quad(high Dose 65+) 11/23/2018, 11/18/2021   Influenza, High Dose Seasonal PF 12/23/2016, 12/12/2017, 12/21/2020   Influenza-Unspecified 12/15/2015, 12/23/2016   PFIZER Comirnaty(Gray Top)Covid-19 Tri-Sucrose Vaccine 04/09/2019, 04/30/2019, 12/02/2019   PFIZER(Purple Top)SARS-COV-2 Vaccination 04/09/2019, 04/30/2019   Pneumococcal Conjugate-13 06/15/2015   Pneumococcal Polysaccharide-23 01/06/2017   Tdap 04/18/2012   Zoster Recombinant(Shingrix) 01/17/2020, 04/22/2020    Past Surgical History:  Procedure Laterality Date   ABDOMINAL AORTOGRAM W/LOWER EXTREMITY Left 01/24/2022   Procedure: ABDOMINAL AORTOGRAM W/LOWER EXTREMITY;  Surgeon: Sheree Penne Bruckner, MD;  Location: [facility] INVASIVE CV LAB;  Service: Cardiovascular;  Laterality: Left;   APPENDECTOMY     BACK SURGERY     BIV ICD  INSERTION CRT-D N/A 03/23/2020   Procedure: BIV ICD INSERTION CRT-D;  Surgeon: Cindie Ole DASEN, MD;  Location: [facility] INVASIVE CV LAB;  Service: Cardiovascular;  Laterality: N/A;   CARDIAC CATHETERIZATION N/A 08/07/2015   Procedure: Left Heart Cath and Coronary Angiography;  Surgeon: Oneil JAYSON Parchment, MD;  Location: [facility] INVASIVE CV LAB;  Service: Cardiovascular;  Laterality: N/A;   CARDIAC CATHETERIZATION N/A 08/07/2015   Procedure: Coronary Stent Intervention;  Surgeon: Oneil JAYSON Parchment, MD;  Location: [facility] INVASIVE CV LAB;  Service: Cardiovascular;  Laterality: N/A;   CARDIAC CATHETERIZATION N/A 08/07/2015   Procedure: Coronary Stent Intervention;  Surgeon: Peter M Jordan, MD;  Location: [facility] INVASIVE CV LAB;  Service: Cardiovascular;  Laterality: N/A;   CAROTID ENDARTERECTOMY  01/05/2006   Right  CEA with DPA   CATARACT EXTRACTION W/ INTRAOCULAR LENS IMPLANT Left 12/04/2017   CATARACT EXTRACTION W/PHACO Left 12/04/2017   Procedure: CATARACT EXTRACTION PHACO AND INTRAOCULAR LENS PLACEMENT (IOC) LEFT;  Surgeon: Myrna Adine Oneil, MD;  Location: [facility] SURGERY CNTR;  Service: Ophthalmology;  Laterality: Left;   CATARACT EXTRACTION W/PHACO Right 02/06/2018   Procedure: CATARACT EXTRACTION PHACO AND INTRAOCULAR LENS PLACEMENT (IOC)RIGHT;  Surgeon: Myrna Adine Oneil, MD;  Location: [facility] SURGERY CNTR;  Service:  Ophthalmology;  Laterality: Right;   COLONOSCOPY  05/20/2008   COLONOSCOPY WITH PROPOFOL  N/A 09/13/2018   Procedure: COLONOSCOPY WITH BIOPSY;  Surgeon: Jinny Carmine, MD;  Location: [facility] SURGERY CNTR;  Service: Endoscopy;  Laterality: N/A;   CORONARY ARTERY BYPASS GRAFT N/A 11/11/2019   Procedure: CORONARY ARTERY BYPASS GRAFTING (CABG) USING LIMA to Diag1; ENDOSCOPICALLY HARVESTED RIGHT GREATER SAPHENOUS VEIN: SVG to OM1; SVG to OM2; SVG to PDA.;  Surgeon: Fleeta Hanford Coy, MD;  Location: [facility] OR;  Service: Open Heart Surgery;  Laterality: N/A;   CORONARY STENT INTERVENTION N/A 12/27/2022   Procedure: CORONARY  STENT INTERVENTION;  Surgeon: Mady Bruckner, MD;  Location: [facility] INVASIVE CV LAB;  Service: Cardiovascular;  Laterality: N/A;   CORONARY STENT PLACEMENT  08/07/2015   MID CIRCUMFLEX   ENDARTERECTOMY FEMORAL Bilateral 09/30/2020   Procedure: ENDARTERECTOMY FEMORAL;  Surgeon: Marea Selinda RAMAN, MD;  Location: [facility] ORS;  Service: Vascular;  Laterality: Bilateral;   ENDARTERECTOMY POPLITEAL Left 01/25/2022   Procedure: ENDARTERECTOMY POPLITEAL;  Surgeon: Sheree Penne Bruckner, MD;  Location: [facility] OR;  Service: Vascular;  Laterality: Left;   ENDOVEIN HARVEST OF GREATER SAPHENOUS VEIN Right 11/11/2019   Procedure: ENDOVEIN HARVEST OF GREATER SAPHENOUS VEIN;  Surgeon: Fleeta Hanford Coy, MD;  Location: [facility] OR;  Service: Open Heart Surgery;  Laterality: Right;   ESOPHAGOGASTRODUODENOSCOPY (EGD) WITH PROPOFOL  N/A 02/11/2019   Procedure: ESOPHAGOGASTRODUODENOSCOPY (EGD) WITH BIOPSY and  Dilation;  Surgeon: Jinny Carmine, MD;  Location: [facility] SURGERY CNTR;  Service: Endoscopy;  Laterality: N/A;   ESOPHAGOGASTRODUODENOSCOPY (EGD) WITH PROPOFOL  N/A 12/23/2021   Procedure: ESOPHAGOGASTRODUODENOSCOPY (EGD) WITH PROPOFOL ;  Surgeon: Jinny Carmine, MD;  Location: [facility] ENDOSCOPY;  Service: Endoscopy;  Laterality: N/A;   FEMORAL-FEMORAL BYPASS GRAFT Left 01/25/2022   Procedure: left Femoral to below the knee Popliteal bypass.;  Surgeon: Sheree Penne Bruckner, MD;  Location: [facility] OR;  Service: Vascular;  Laterality: Left;   HIP SURGERY Left 10/2016   left hip tendon repair   LEFT HEART CATH AND CORONARY ANGIOGRAPHY N/A 11/27/2017   Procedure: LEFT HEART CATH AND CORONARY ANGIOGRAPHY;  Surgeon: Darron Deatrice LABOR, MD;  Location: [facility] INVASIVE CV LAB;  Service: Cardiovascular;  Laterality: N/A;   LOWER EXTREMITY ANGIOGRAPHY Left 02/12/2018   Procedure: LOWER EXTREMITY ANGIOGRAPHY;  Surgeon: Marea Selinda RAMAN, MD;  Location: [facility] INVASIVE CV LAB;  Service: Cardiovascular;  Laterality: Left;   LOWER EXTREMITY ANGIOGRAPHY Left  03/07/2018   Procedure: LOWER EXTREMITY ANGIOGRAPHY;  Surgeon: Marea Selinda RAMAN, MD;  Location: [facility] INVASIVE CV LAB;  Service: Cardiovascular;  Laterality: Left;   LOWER EXTREMITY ANGIOGRAPHY Left 06/04/2018   Procedure: LOWER EXTREMITY ANGIOGRAPHY;  Surgeon: Marea Selinda RAMAN, MD;  Location: [facility] INVASIVE CV LAB;  Service: Cardiovascular;  Laterality: Left;   LOWER EXTREMITY ANGIOGRAPHY Left 09/17/2020   Procedure: LOWER EXTREMITY ANGIOGRAPHY;  Surgeon: Marea Selinda RAMAN, MD;  Location: [facility] INVASIVE CV LAB;  Service: Cardiovascular;  Laterality: Left;   LOWER EXTREMITY ANGIOGRAPHY Left 09/28/2020   Procedure: LOWER EXTREMITY ANGIOGRAPHY;  Surgeon: Marea Selinda RAMAN, MD;  Location: [facility] INVASIVE CV LAB;  Service: Cardiovascular;  Laterality: Left;   LOWER EXTREMITY ANGIOGRAPHY N/A 02/15/2021   Procedure: LOWER EXTREMITY ANGIOGRAPHY;  Surgeon: Marea Selinda RAMAN, MD;  Location: [facility] INVASIVE CV LAB;  Service: Cardiovascular;  Laterality: N/A;   LOWER EXTREMITY ANGIOGRAPHY Left 12/27/2021   Procedure: Lower Extremity Angiography;  Surgeon: Marea Selinda RAMAN, MD;  Location: [facility] INVASIVE CV LAB;  Service: Cardiovascular;  Laterality: Left;   LOWER EXTREMITY ANGIOGRAPHY Left 12/28/2021   Procedure: Lower  Extremity Angiography;  Surgeon: Marea Selinda RAMAN, MD;  Location: [facility] INVASIVE CV LAB;  Service: Cardiovascular;  Laterality: Left;   PLACEMENT OF IMPELLA LEFT VENTRICULAR ASSIST DEVICE N/A 11/11/2019   Procedure: PLACEMENT OF IMPELLA LEFT VENTRICULAR ASSIST DEVICE 5.5;  Surgeon: Fleeta Hanford Coy, MD;  Location: [facility] OR;  Service: Open Heart Surgery;  Laterality: N/A;  Midline Sternotomy   POLYPECTOMY N/A 09/13/2018   Procedure: POLYPECTOMY;  Surgeon: Jinny Carmine, MD;  Location: [facility] SURGERY CNTR;  Service: Endoscopy;  Laterality: N/A;   POLYPECTOMY N/A 02/11/2019   Procedure: POLYPECTOMY;  Surgeon: Jinny Carmine, MD;  Location: [facility] SURGERY CNTR;  Service: Endoscopy;  Laterality: N/A;   REMOVAL OF IMPELLA LEFT VENTRICULAR ASSIST DEVICE  N/A 11/15/2019   Procedure: REMOVAL OF IMPELLA 5.5 LEFT VENTRICULAR ASSIST DEVICE;  Surgeon: Fleeta Hanford Coy, MD;  Location: [facility] OR;  Service: Open Heart Surgery;  Laterality: N/A;   RIGHT/LEFT HEART CATH AND CORONARY ANGIOGRAPHY N/A 11/05/2019   Procedure: RIGHT/LEFT HEART CATH AND CORONARY ANGIOGRAPHY;  Surgeon: Mady Bruckner, MD;  Location: [facility] INVASIVE CV LAB;  Service: Cardiovascular;  Laterality: N/A;   RIGHT/LEFT HEART CATH AND CORONARY/GRAFT ANGIOGRAPHY Bilateral 12/27/2022   Procedure: RIGHT/LEFT HEART CATH AND CORONARY/GRAFT ANGIOGRAPHY;  Surgeon: Mady Bruckner, MD;  Location: [facility] INVASIVE CV LAB;  Service: Cardiovascular;  Laterality: Bilateral;   SPINE SURGERY     TEE WITHOUT CARDIOVERSION N/A 11/11/2019   Procedure: TRANSESOPHAGEAL ECHOCARDIOGRAM (TEE);  Surgeon: Fleeta Hanford, Coy, MD;  Location: [facility] OR;  Service: Open Heart Surgery;  Laterality: N/A;   TEE WITHOUT CARDIOVERSION N/A 11/15/2019   Procedure: TRANSESOPHAGEAL ECHOCARDIOGRAM (TEE);  Surgeon: Fleeta Hanford, Coy, MD;  Location: [facility] OR;  Service: Open Heart Surgery;  Laterality: N/A;   TONSILLECTOMY      Social History   Tobacco Use   Smoking status: Former    Current packs/day: 0.00    Average packs/day: 1.3 packs/day for 35.0 years (43.8 ttl pk-yrs)    Types: Cigarettes    Start date: 02/28/1970    Quit date: 02/28/2005    Years since quitting: 18.0    Passive exposure: Never   Smokeless tobacco: Current    Types: Snuff   Tobacco comments:    occaisionally  Vaping Use   Vaping status: Never Used  Substance Use Topics   Alcohol use: Yes    Alcohol/week: 10.0 standard drinks of alcohol    Types: 10 Standard drinks or equivalent per week    Comment: wine/liquor weekly   Drug use: No    Family History  Problem Relation Age of Onset   Heart attack Mother    Coronary artery disease Mother    Heart disease Mother        Carotid Stenosis and BPG and Heart Disease before age 63   Diabetes Mother    Hypertension  Mother    Heart attack Father    Heart disease Father        BPG and Heart Disease before age 68   Hypertension Father    Cancer Father 52       throat   Stroke Father    Colon cancer Neg Hx    Colon polyps Neg Hx    Esophageal cancer Neg Hx    Rectal cancer Neg Hx    Stomach cancer Neg Hx         03/09/2023    8:42 AM 03/22/2022    7:57 AM 03/18/2021   10:20 AM 02/24/2021   11:29 AM  GAD 7 :  Generalized Anxiety Score  Nervous, Anxious, on Edge 0 0 0 0  Control/stop worrying 0 0 0 0  Worry too much - different things 0 0 0 0  Trouble relaxing 0 0 0 0  Restless 0 0 0 0  Easily annoyed or irritable 0 0 0 0  Afraid - awful might happen 0 0 0 0  Total GAD 7 Score 0 0 0 0  Anxiety Difficulty Not difficult at all Not difficult at all  Not difficult at all       03/09/2023    8:42 AM 12/06/2022    8:20 AM 03/22/2022    7:57 AM  Depression screen PHQ 2/9  Decreased Interest 0 0 0  Down, Depressed, Hopeless 0 0 0  PHQ - 2 Score 0 0 0  Altered sleeping  0 0  Tired, decreased energy  0 0  Change in appetite  0 0  Feeling bad or failure about yourself   0 0  Trouble concentrating  0 0  Moving slowly or fidgety/restless  0 0  Suicidal thoughts  0 0  PHQ-9 Score  0 0  Difficult doing work/chores  Not difficult at all Not difficult at all    BP Readings from Last 3 Encounters:  03/09/23 (!) 80/54  02/23/23 (!) 158/112  01/16/23 100/68    Wt Readings from Last 3 Encounters:  03/09/23 183 lb (83 kg)  02/23/23 179 lb 14.3 oz (81.6 kg)  01/16/23 180 lb (81.6 kg)    BP (!) 80/54   Pulse 74   Temp 98.1 F (36.7 C)   Ht 5' 6 (1.676 m)   Wt 183 lb (83 kg)   SpO2 95%   BMI 29.54 kg/m   Physical Exam Vitals and nursing note reviewed.  Constitutional:      General: He is not in acute distress.    Appearance: Normal appearance. He is well-developed.  HENT:     Head: Normocephalic and atraumatic.     Right Ear: Tympanic membrane normal.     Left Ear: Tympanic membrane  normal.     Nose:     Right Sinus: No maxillary sinus tenderness or frontal sinus tenderness.     Left Sinus: No maxillary sinus tenderness or frontal sinus tenderness.  Cardiovascular:     Rate and Rhythm: Normal rate and regular rhythm. No extrasystoles are present. Pulmonary:     Effort: Pulmonary effort is normal. No respiratory distress.     Breath sounds: Normal breath sounds. No wheezing or rhonchi.  Musculoskeletal:     Cervical back: Normal range of motion.  Skin:    General: Skin is warm and dry.     Capillary Refill: Capillary refill takes less than 2 seconds.     Findings: No rash.  Neurological:     General: No focal deficit present.     Mental Status: He is alert and oriented to person, place, and time.  Psychiatric:        Mood and Affect: Mood normal.        Behavior: Behavior normal.     Recent Labs     Component Value Date/Time   NA 134 (L) 02/23/2023 0846   NA 140 12/15/2022 0930   K 4.3 02/23/2023 0846   CL 100 02/23/2023 0846   CO2 25 02/23/2023 0846   GLUCOSE 97 02/23/2023 0846   BUN 28 (H) 02/23/2023 0846   BUN 25 12/15/2022 0930   CREATININE 1.01 02/23/2023 0846   CREATININE  1.12 07/23/2015 1016   CALCIUM  8.8 (L) 02/23/2023 0846   PROT 7.2 02/23/2023 0846   PROT 7.1 03/22/2022 0839   ALBUMIN  4.0 02/23/2023 0846   ALBUMIN  4.4 03/22/2022 0839   AST 20 02/23/2023 0846   ALT 19 02/23/2023 0846   ALKPHOS 78 02/23/2023 0846   BILITOT 0.9 02/23/2023 0846   BILITOT 0.3 03/22/2022 0839   GFRNONAA >60 02/23/2023 0846   GFRAA 85 04/22/2020 1013    Lab Results  Component Value Date   WBC 4.8 02/23/2023   HGB 14.1 02/23/2023   HCT 40.7 02/23/2023   MCV 97.1 02/23/2023   PLT 151 02/23/2023   Lab Results  Component Value Date   HGBA1C 5.6 11/07/2019   Lab Results  Component Value Date   CHOL 135 03/22/2022   HDL 55 03/22/2022   LDLCALC 59 03/22/2022   TRIG 120 03/22/2022   CHOLHDL 2.5 03/22/2022   Lab Results  Component Value Date   TSH  2.510 03/22/2022     Assessment and Plan: Problem List Items Addressed This Visit       Unprioritized   Statin myopathy   Other Visit Diagnoses       Viral URI with cough    -  Primary   recommend Flonase or Nasocort for congestion no indication for antibiotics at this time     PVC's (premature ventricular contractions)       patient reassured; limit caffeine discuss with Cardiology next visit        Leita Adie, MD

## 2023-03-09 NOTE — Patient Instructions (Signed)
 Flonase or Nasocort nasal spray - use daily

## 2023-03-13 ENCOUNTER — Encounter: Payer: Self-pay | Admitting: Internal Medicine

## 2023-03-13 ENCOUNTER — Other Ambulatory Visit: Payer: Self-pay | Admitting: Internal Medicine

## 2023-03-13 DIAGNOSIS — J069 Acute upper respiratory infection, unspecified: Secondary | ICD-10-CM

## 2023-03-13 MED ORDER — PROMETHAZINE-DM 6.25-15 MG/5ML PO SYRP: 5.0000 mL | ORAL_SOLUTION | Freq: Four times a day (QID) | ORAL | 0 refills | Status: AC | PRN

## 2023-03-13 NOTE — Telephone Encounter (Signed)
 Please review.  KP

## 2023-03-20 ENCOUNTER — Ambulatory Visit: Payer: PPO | Attending: Physician Assistant

## 2023-03-20 DIAGNOSIS — I5022 Chronic systolic (congestive) heart failure: Secondary | ICD-10-CM | POA: Diagnosis not present

## 2023-03-20 LAB — ECHOCARDIOGRAM LIMITED
AV Mean grad: 2 mm[Hg]
AV Peak grad: 4.4 mm[Hg]
Ao pk vel: 1.05 m/s
Area-P 1/2: 4.49 cm2
Calc EF: 44.2 %
S' Lateral: 5.3 cm
Single Plane A2C EF: 44.4 %
Single Plane A4C EF: 45.4 %

## 2023-03-21 ENCOUNTER — Ambulatory Visit (INDEPENDENT_AMBULATORY_CARE_PROVIDER_SITE_OTHER): Payer: PPO

## 2023-03-21 ENCOUNTER — Ambulatory Visit: Payer: PPO

## 2023-03-21 DIAGNOSIS — I428 Other cardiomyopathies: Secondary | ICD-10-CM | POA: Diagnosis not present

## 2023-03-21 LAB — CUP PACEART REMOTE DEVICE CHECK
Battery Remaining Longevity: 32 mo
Battery Remaining Percentage: 50 %
Battery Voltage: 2.95 V
Brady Statistic AP VP Percent: 85 %
Brady Statistic AP VS Percent: 6.1 %
Brady Statistic AS VP Percent: 2.4 %
Brady Statistic AS VS Percent: 1 %
Brady Statistic RA Percent Paced: 85 %
Date Time Interrogation Session: 20250120185744
HighPow Impedance: 65 Ohm
Implantable Lead Connection Status: 753985
Implantable Lead Connection Status: 753985
Implantable Lead Connection Status: 753985
Implantable Lead Implant Date: 20220124
Implantable Lead Implant Date: 20220124
Implantable Lead Implant Date: 20220124
Implantable Lead Location: 753858
Implantable Lead Location: 753859
Implantable Lead Location: 753860
Implantable Pulse Generator Implant Date: 20220124
Lead Channel Impedance Value: 460 Ohm
Lead Channel Impedance Value: 490 Ohm
Lead Channel Impedance Value: 580 Ohm
Lead Channel Pacing Threshold Amplitude: 0.5 V
Lead Channel Pacing Threshold Amplitude: 0.875 V
Lead Channel Pacing Threshold Amplitude: 1.75 V
Lead Channel Pacing Threshold Pulse Width: 0.5 ms
Lead Channel Pacing Threshold Pulse Width: 0.5 ms
Lead Channel Pacing Threshold Pulse Width: 0.5 ms
Lead Channel Sensing Intrinsic Amplitude: 1.9 mV
Lead Channel Sensing Intrinsic Amplitude: 12 mV
Lead Channel Setting Pacing Amplitude: 1.875
Lead Channel Setting Pacing Amplitude: 2 V
Lead Channel Setting Pacing Amplitude: 2.25 V
Lead Channel Setting Pacing Pulse Width: 0.5 ms
Lead Channel Setting Pacing Pulse Width: 0.5 ms
Lead Channel Setting Sensing Sensitivity: 0.5 mV
Pulse Gen Serial Number: 810017138
Zone Setting Status: 755011

## 2023-03-22 ENCOUNTER — Other Ambulatory Visit (HOSPITAL_COMMUNITY): Payer: Self-pay

## 2023-03-22 ENCOUNTER — Telehealth: Payer: Self-pay | Admitting: Pharmacy Technician

## 2023-03-22 ENCOUNTER — Encounter: Payer: Self-pay | Admitting: Cardiology

## 2023-03-22 NOTE — Telephone Encounter (Signed)
Pharmacy Patient Advocate Encounter   Received notification from CoverMyMeds that prior authorization for praluent is required/requested.   Insurance verification completed.   The patient is insured through [facility] ADVANTAGE/RX ADVANCE .   Per test claim: PA required; PA submitted to above mentioned insurance via CoverMyMeds Key/confirmation #/EOC [id number] Status is pending

## 2023-03-23 ENCOUNTER — Other Ambulatory Visit (HOSPITAL_COMMUNITY): Payer: Self-pay

## 2023-03-23 DIAGNOSIS — R21 Rash and other nonspecific skin eruption: Secondary | ICD-10-CM | POA: Diagnosis not present

## 2023-03-23 NOTE — Telephone Encounter (Signed)
Pharmacy Patient Advocate Encounter  Received notification from [facility] ADVANTAGE/RX ADVANCE that Prior Authorization for praluent has been APPROVED from 03/23/23 to 03/22/24. Ran test claim, Copay is $47.00 one month. This test claim was processed through [facility]- copay amounts may vary at other pharmacies due to pharmacy/plan contracts, or as the patient moves through the different stages of their insurance plan.   PA #/Case ID/Reference #: [id number]

## 2023-03-24 ENCOUNTER — Other Ambulatory Visit (HOSPITAL_COMMUNITY): Payer: Self-pay | Admitting: Cardiology

## 2023-03-26 ENCOUNTER — Other Ambulatory Visit: Payer: Self-pay | Admitting: Cardiology

## 2023-03-28 NOTE — Telephone Encounter (Signed)
Last office visit: 01/16/23 with plan to f/u 6 months. next office visit: 04/04/23

## 2023-04-03 NOTE — Progress Notes (Signed)
 Cardiology Office Note    Date:  04/04/2023   ID:  AZAD CALAME, DOB 12-Mar-1950, MRN [id number]  PCP:  Justus Leita DEL, MD  Cardiologist:  Lonni Hanson, MD  Electrophysiologist:  OLE ONEIDA HOLTS, MD   Chief Complaint: Follow-up  History of Present Illness:   [name] is a 73 y.o. 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   History of CAD with NSTEMI in 2017 s/p PCI to the LCx. He underwent repeat LHC in 10/2017 with widely patent LCx without significant restenosis and otherwise mild to moderate CAD.  EF 40-45%. Echo showed EF 45-50%. Repeat limited echo in 01/2018 showed an EF of 40-45%. Medical management advised. He was seen in clinic in 09/2019 with DOE and abnormal EKG. Subsequent LHC on 11/05/2019 showed multivessel CAD with development of severe distal LMCA disease. Repeat echo revealed EF 25-30%, LV severely dilated, and mild MR. He was transferred to [facility] for and underwent 4-vessel CABG (LIMA to LAD, SVG to OM1, SVG to OM2, SVG to PDA) with placement of Impella 5.5 on 11/11/2019 (removed on postoperative day 4). He did have postoperative Afib with sinus rhythm restored once started on IV amiodarone  and transition to oral amiodarone  and apixaban  at discharge.  Amiodarone  and digoxin  previously discontinued due to nausea and supratherapeutic medication level,  respectively.  Repeat echo in 02/2020 showed an EF of 30 to 35%, goal hypokinesis with septal lateral dyssynchrony in the setting of LBBB, reduced RV systolic function, mild LVH, and grade 1 diastolic dysfunction.  Given his persistently low EF in the setting of chronic LBBB he underwent CRT-D placement in 2022.  GDMT is also previously been limited by hyperkalemia and concern that his GDMT was making him feel worse than he did before CABG.  Following CRT-D placement, repeat echo in 07/2020 showed low normal LV systolic function with an EF of 50 to 55%, no regional wall motion abnormalities, mildly dilated LV internal cavity size, normal RV systolic function and ventricular cavity size, mildly dilated left atrium, mild mitral regurgitation, and mild aortic valve sclerosis without evidence of stenosis.  With regards to his CRT-D, EP notes he historically has low BiV pacing due to PVCs leading to reinitiation of amiodarone , though this was ultimately discontinued due to off target effects reporting he generally did not feel well on the medication.  In this setting, he was initiated on mexiletine in 11/2021 in an effort to suppress PVCs.  Device interrogation in 05/2022 with 86% BiV pacing.  In this setting, echo was recommended to assess EF with PVC burden.  Most recent device interrogation from 08/2022 showed a less than 1% A-fib burden with 82% BiV pacing.  Echo on 12/13/2022 at showed an EF of 40 to 45%, mild global hypokinesis with moderate inferior wall hypokinesis, mildly dilated LV internal cavity size, grade 1 diastolic dysfunction, normal RV systolic function and ventricular  cavity size, mildly dilated left atrium, mild mitral regurgitation, mild aortic insufficiency, and mild dilatation of the ascending aorta measuring 42 mm (aortic valve documented to be tricuspid).   With regards to his PAD and carotid artery disease, most recent ABI from 11/2022 were normal bilaterally with decreased TBI.  Lower extremity  arterial ultrasound showed patent left femoral below-knee popliteal artery bypass graft without evidence of hemodynamically significant stenosis.  Carotid artery ultrasound in 11/2022 showed 1 to 39% right-sided ICA stenosis and 40 to 59% left ICA stenosis, which was improved from study dated 04/2022.   He was seen in the office on 12/15/2022 and was without symptoms of angina or cardiac decompensation.  He did continue to note generalized malaise and fatigue and reported symptoms were more noticeable following the initiation of mexiletine.  In the setting of his progressive cardiomyopathy he underwent R/LHC on 12/27/2022 that showed severe native vessel CAD including 80% distal left main stenosis and chronic total occlusions of D2 and OM1.  The culprit for his symptoms and progressive cardiomyopathy was felt to likely be sequential 50% and 90% stenoses in the proximal/mid RCA.  Widely patent LIMA to D2 and SVG Y graft to OM1 and OM 4.  Chronically occluded SVG to RPDA.  Upper normal to mildly elevated left and pulmonary artery pressures with mildly elevated right heart filling pressures and normal cardiac output/index.  He underwent successful PCI/DES to the proximal/mid RCA.  He was last seen on 01/16/2023 and felt like his fatigue was somewhat improved compared to prior visit.  Relative hypotension precluded escalation of GDMT.  He was evaluated by EP that same day with device interrogation with A-fib with recommendation for the patient to remain on OAC.  EP recommended he continue mexiletine.  He was seen in the ED on 02/23/2023 with intermittent palpitations.  Note indicates pacemaker interrogation showed no significant arrhythmias.  High-sensitivity troponin negative x 2.  BNP 125.  Chest x-ray with no acute cardiopulmonary abnormality.    Device interrogation from 03/2023 showed 88% BiV pacing and a 5.7% PVC burden.  Reviewed by EP.  Echo in 03/2023 showed an EF of 35 to 40%, global hypokinesis, grade 1  diastolic dysfunction, normal RV systolic function, and mild mitral regurgitation.  He comes in accompanied by his wife today and is doing well from a cardiac perspective, without symptoms of angina or cardiac decompensation.  Continues to note intermittent brief episodes of palpitations that will last for several seconds and spontaneously resolved.  These are typically noted early in the morning, prior to getting up and out of bed.  No associated symptoms with these.  No lower extremity swelling or progressive orthopnea.  No falls, hematochezia, or melena.   Labs independently reviewed: 01/2023 - potassium 4.3, BUN 28, serum creatinine 1.01, albumin  4.0, AST/ALT normal, Hgb 14.1, PLT 151 11/2022 - LP(a) 44.4 02/2022 - TSH normal, TC 135, TG 120, HDL 55, LDL 59 10/2019 - A1c 5.6  Past Medical History:  Diagnosis Date   Abnormal nuclear cardiac imaging test 08/08/2015   AICD (automatic cardioverter/defibrillator) present    pacemaker/defib [employer]   Arthritis    fingers   Carotid artery occlusion    CHF (congestive heart failure) (HCC)    Coronary atherosclerosis of native coronary artery 01/29/2013   11/05/19 R/LHC 80% dLMCA stenosis small diffusely dz dLAD, chronically occluded OM1 50%mRCA lesion, widely patent mLCx strent, moderately elevated L heart filling pressures, mild to moderate RH filling pressures, normal to moderately reduced CO  Duodenal erosion    Encounter for screening for lung cancer 07/13/2016   Esophageal stenosis    esophageal dilation   GERD (gastroesophageal reflux disease)    H. 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 Cardiac catheterization-nonobstructive 60% RCA disease-subsequent nuclear stress test-9 minutes, low risk, mild inferior wall hypokinesis    Pain in  limb 12/19/2017   Peripheral vascular disease (HCC)    Unstable angina (HCC) 11/25/2017    Past Surgical History:  Procedure Laterality Date   ABDOMINAL AORTOGRAM W/LOWER EXTREMITY Left 01/24/2022   Procedure: ABDOMINAL AORTOGRAM W/LOWER EXTREMITY;  Surgeon: Sheree Penne Bruckner, MD;  Location: [facility] INVASIVE CV LAB;  Service: Cardiovascular;  Laterality: Left;   APPENDECTOMY     BACK SURGERY     BIV ICD INSERTION CRT-D N/A 03/23/2020   Procedure: BIV ICD INSERTION CRT-D;  Surgeon: Cindie Ole DASEN, MD;  Location: [facility] INVASIVE CV LAB;  Service: Cardiovascular;  Laterality: N/A;   CARDIAC CATHETERIZATION N/A 08/07/2015   Procedure: Left Heart Cath and Coronary Angiography;  Surgeon: Oneil JAYSON Parchment, MD;  Location: [facility] INVASIVE CV LAB;  Service: Cardiovascular;  Laterality: N/A;   CARDIAC CATHETERIZATION N/A 08/07/2015   Procedure: Coronary Stent Intervention;  Surgeon: Oneil JAYSON Parchment, MD;  Location: [facility] INVASIVE CV LAB;  Service: Cardiovascular;  Laterality: N/A;   CARDIAC CATHETERIZATION N/A 08/07/2015   Procedure: Coronary Stent Intervention;  Surgeon: Peter M Jordan, MD;  Location: [facility] INVASIVE CV LAB;  Service: Cardiovascular;  Laterality: N/A;   CAROTID ENDARTERECTOMY  01/05/2006   Right  CEA with DPA   CATARACT EXTRACTION W/ INTRAOCULAR LENS IMPLANT Left 12/04/2017   CATARACT EXTRACTION W/PHACO Left 12/04/2017   Procedure: CATARACT EXTRACTION PHACO AND INTRAOCULAR LENS PLACEMENT (IOC) LEFT;  Surgeon: Myrna Adine Oneil, MD;  Location: [facility] SURGERY CNTR;  Service: Ophthalmology;  Laterality: Left;   CATARACT EXTRACTION W/PHACO Right 02/06/2018   Procedure: CATARACT EXTRACTION PHACO AND INTRAOCULAR LENS PLACEMENT (IOC)RIGHT;  Surgeon: Myrna Adine Oneil, MD;  Location: [facility] SURGERY CNTR;  Service: Ophthalmology;  Laterality: Right;   COLONOSCOPY  05/20/2008   COLONOSCOPY WITH PROPOFOL  N/A 09/13/2018   Procedure: COLONOSCOPY WITH BIOPSY;  Surgeon: Jinny Carmine, MD;  Location: [facility] SURGERY  CNTR;  Service: Endoscopy;  Laterality: N/A;   CORONARY ARTERY BYPASS GRAFT N/A 11/11/2019   Procedure: CORONARY ARTERY BYPASS GRAFTING (CABG) USING LIMA to Diag1; ENDOSCOPICALLY HARVESTED RIGHT GREATER SAPHENOUS VEIN: SVG to OM1; SVG to OM2; SVG to PDA.;  Surgeon: Fleeta Hanford Coy, MD;  Location: [facility] OR;  Service: Open Heart Surgery;  Laterality: N/A;   CORONARY STENT INTERVENTION N/A 12/27/2022   Procedure: CORONARY STENT INTERVENTION;  Surgeon: Mady Bruckner, MD;  Location: [facility] INVASIVE CV LAB;  Service: Cardiovascular;  Laterality: N/A;   CORONARY STENT PLACEMENT  08/07/2015   MID CIRCUMFLEX   ENDARTERECTOMY FEMORAL Bilateral 09/30/2020   Procedure: ENDARTERECTOMY FEMORAL;  Surgeon: Marea Selinda RAMAN, MD;  Location: [facility] ORS;  Service: Vascular;  Laterality: Bilateral;   ENDARTERECTOMY POPLITEAL Left 01/25/2022   Procedure: ENDARTERECTOMY POPLITEAL;  Surgeon: Sheree Penne Bruckner, MD;  Location: [facility] OR;  Service: Vascular;  Laterality: Left;   ENDOVEIN HARVEST OF GREATER SAPHENOUS VEIN Right 11/11/2019   Procedure: ENDOVEIN HARVEST OF GREATER SAPHENOUS VEIN;  Surgeon: Fleeta Hanford Coy, MD;  Location: [facility] OR;  Service: Open Heart Surgery;  Laterality: Right;   ESOPHAGOGASTRODUODENOSCOPY (EGD) WITH PROPOFOL  N/A 02/11/2019   Procedure: ESOPHAGOGASTRODUODENOSCOPY (EGD) WITH BIOPSY and  Dilation;  Surgeon: Jinny Carmine, MD;  Location: [facility] SURGERY CNTR;  Service: Endoscopy;  Laterality: N/A;   ESOPHAGOGASTRODUODENOSCOPY (EGD) WITH PROPOFOL  N/A 12/23/2021   Procedure: ESOPHAGOGASTRODUODENOSCOPY (EGD) WITH PROPOFOL ;  Surgeon: Jinny Carmine, MD;  Location: [facility] ENDOSCOPY;  Service: Endoscopy;  Laterality: N/A;   FEMORAL-FEMORAL BYPASS GRAFT Left 01/25/2022   Procedure: left Femoral to below the knee Popliteal bypass.;  Surgeon: Sheree Penne Bruckner, MD;  Location: [facility] OR;  Service: Vascular;  Laterality: Left;   HIP SURGERY Left 10/2016   left hip tendon repair   LEFT HEART CATH AND CORONARY  ANGIOGRAPHY N/A 11/27/2017   Procedure: LEFT HEART CATH AND CORONARY ANGIOGRAPHY;  Surgeon: Darron Deatrice LABOR, MD;  Location: [facility] INVASIVE CV LAB;  Service: Cardiovascular;  Laterality: N/A;   LOWER EXTREMITY ANGIOGRAPHY Left 02/12/2018   Procedure: LOWER EXTREMITY ANGIOGRAPHY;  Surgeon: Marea Selinda RAMAN, MD;  Location: [facility] INVASIVE CV LAB;  Service: Cardiovascular;  Laterality: Left;   LOWER EXTREMITY ANGIOGRAPHY Left 03/07/2018   Procedure: LOWER EXTREMITY ANGIOGRAPHY;  Surgeon: Marea Selinda RAMAN, MD;  Location: [facility] INVASIVE CV LAB;  Service: Cardiovascular;  Laterality: Left;   LOWER EXTREMITY ANGIOGRAPHY Left 06/04/2018   Procedure: LOWER EXTREMITY ANGIOGRAPHY;  Surgeon: Marea Selinda RAMAN, MD;  Location: [facility] INVASIVE CV LAB;  Service: Cardiovascular;  Laterality: Left;   LOWER EXTREMITY ANGIOGRAPHY Left 09/17/2020   Procedure: LOWER EXTREMITY ANGIOGRAPHY;  Surgeon: Marea Selinda RAMAN, MD;  Location: [facility] INVASIVE CV LAB;  Service: Cardiovascular;  Laterality: Left;   LOWER EXTREMITY ANGIOGRAPHY Left 09/28/2020   Procedure: LOWER EXTREMITY ANGIOGRAPHY;  Surgeon: Marea Selinda RAMAN, MD;  Location: [facility] INVASIVE CV LAB;  Service: Cardiovascular;  Laterality: Left;   LOWER EXTREMITY ANGIOGRAPHY N/A 02/15/2021   Procedure: LOWER EXTREMITY ANGIOGRAPHY;  Surgeon: Marea Selinda RAMAN, MD;  Location: [facility] INVASIVE CV LAB;  Service: Cardiovascular;  Laterality: N/A;   LOWER EXTREMITY ANGIOGRAPHY Left 12/27/2021   Procedure: Lower Extremity Angiography;  Surgeon: Marea Selinda RAMAN, MD;  Location: [facility] INVASIVE CV LAB;  Service: Cardiovascular;  Laterality: Left;   LOWER EXTREMITY ANGIOGRAPHY Left 12/28/2021   Procedure: Lower Extremity Angiography;  Surgeon: Marea Selinda RAMAN, MD;  Location: [facility] INVASIVE CV LAB;  Service: Cardiovascular;  Laterality: Left;   PLACEMENT OF IMPELLA LEFT VENTRICULAR ASSIST DEVICE N/A 11/11/2019   Procedure: PLACEMENT OF IMPELLA LEFT VENTRICULAR ASSIST DEVICE 5.5;  Surgeon: Fleeta Hanford Coy, MD;  Location: [facility] OR;   Service: Open Heart Surgery;  Laterality: N/A;  Midline Sternotomy   POLYPECTOMY N/A 09/13/2018   Procedure: POLYPECTOMY;  Surgeon: Jinny Carmine, MD;  Location: [facility] SURGERY CNTR;  Service: Endoscopy;  Laterality: N/A;   POLYPECTOMY N/A 02/11/2019   Procedure: POLYPECTOMY;  Surgeon: Jinny Carmine, MD;  Location: [facility] SURGERY CNTR;  Service: Endoscopy;  Laterality: N/A;   REMOVAL OF IMPELLA LEFT VENTRICULAR ASSIST DEVICE N/A 11/15/2019   Procedure: REMOVAL OF IMPELLA 5.5 LEFT VENTRICULAR ASSIST DEVICE;  Surgeon: Fleeta Hanford Coy, MD;  Location: [facility] OR;  Service: Open Heart Surgery;  Laterality: N/A;   RIGHT/LEFT HEART CATH AND CORONARY ANGIOGRAPHY N/A 11/05/2019   Procedure: RIGHT/LEFT HEART CATH AND CORONARY ANGIOGRAPHY;  Surgeon: Mady Bruckner, MD;  Location: [facility] INVASIVE CV LAB;  Service: Cardiovascular;  Laterality: N/A;   RIGHT/LEFT HEART CATH AND CORONARY/GRAFT ANGIOGRAPHY Bilateral 12/27/2022   Procedure: RIGHT/LEFT HEART CATH AND CORONARY/GRAFT ANGIOGRAPHY;  Surgeon: Mady Bruckner, MD;  Location: [facility]  INVASIVE CV LAB;  Service: Cardiovascular;  Laterality: Bilateral;   SPINE SURGERY     TEE WITHOUT CARDIOVERSION N/A 11/11/2019   Procedure: TRANSESOPHAGEAL ECHOCARDIOGRAM (TEE);  Surgeon: Fleeta Ochoa, Maude, MD;  Location: [facility] OR;  Service: Open Heart Surgery;  Laterality: N/A;   TEE WITHOUT CARDIOVERSION N/A 11/15/2019   Procedure: TRANSESOPHAGEAL ECHOCARDIOGRAM (TEE);  Surgeon: Fleeta Ochoa, Maude, MD;  Location: [facility] OR;  Service: Open Heart Surgery;  Laterality: N/A;   TONSILLECTOMY      Current Medications: Current Meds  Medication Sig   clobetasol (TEMOVATE) 0.05 % external solution Apply 1 Application topically daily.   desoximetasone (TOPICORT) 0.25 % cream Apply 1 Application topically 2 (two) times daily.   hydrOXYzine (ATARAX) 25 MG tablet Take 25 mg by mouth at bedtime.   levocetirizine (XYZAL) 5 MG tablet Take 5 mg by mouth daily.   spironolactone  (ALDACTONE ) 25 MG tablet Take  0.5 tablets (12.5 mg total) by mouth daily.    Allergies:   Brilinta  [ticagrelor ], Chlorhexidine  gluconate, Contrast media [iodinated contrast media], Statins, and Zetia [ezetimibe]   Social History   Socioeconomic History   Marital status: Married    Spouse name: Not on file   Number of children: 1   Years of education: Not on file   Highest education level: Bachelor's degree (e.g., BA, AB, BS)  Occupational History   Not on file  Tobacco Use   Smoking status: Former    Current packs/day: 0.00    Average packs/day: 1.3 packs/day for 35.0 years (43.8 ttl pk-yrs)    Types: Cigarettes    Start date: 02/28/1970    Quit date: 02/28/2005    Years since quitting: 18.1    Passive exposure: Never   Smokeless tobacco: Current    Types: Snuff   Tobacco comments:    occaisionally  Vaping Use   Vaping status: Never Used  Substance and Sexual Activity   Alcohol use: Yes    Alcohol/week: 10.0 standard drinks of alcohol    Types: 10 Standard drinks or equivalent per week    Comment: wine/liquor weekly   Drug use: No   Sexual activity: Yes  Other Topics Concern   Not on file  Social History Narrative   Not on file   Social Drivers of Health   Financial Resource Strain: Low Risk  (12/06/2022)   Overall Financial Resource Strain (CARDIA)    Difficulty of Paying Living Expenses: Not hard at all  Food Insecurity: No Food Insecurity (12/06/2022)   Hunger Vital Sign    Worried About Running Out of Food in the Last Year: Never true    Ran Out of Food in the Last Year: Never true  Transportation Needs: No Transportation Needs (03/09/2023)   PRAPARE - Administrator, Civil Service (Medical): No    Lack of Transportation (Non-Medical): No  Physical Activity: Insufficiently Active (12/06/2022)   Exercise Vital Sign    Days of Exercise per Week: 3 days    Minutes of Exercise per Session: 30 min  Stress: No Stress Concern Present (12/06/2022)   [employer] of Occupational Health  - Occupational Stress Questionnaire    Feeling of Stress : Not at all  Social Connections: Moderately Integrated (03/09/2023)   Social Connection and Isolation Panel [NHANES]    Frequency of Communication with Friends and Family: Once a week    Frequency of Social Gatherings with Friends and Family: Once a week    Attends Religious Services: 1 to 4 times per year  Active Member of Clubs or Organizations: Yes    Attends Engineer, Structural: More than 4 times per year    Marital Status: Married     Family History:  The patient's family history includes Cancer (age of onset: 36) in his father; Coronary artery disease in his mother; Diabetes in his mother; Heart attack in his father and mother; Heart disease in his father and mother; Hypertension in his father and mother; Stroke in his father. There is no history of Colon cancer, Colon polyps, Esophageal cancer, Rectal cancer, or Stomach cancer.  ROS:   12-point review of systems is negative unless otherwise noted in the HPI.   EKGs/Labs/Other Studies Reviewed:    Studies reviewed were summarized above. The additional studies were reviewed today:  Limited echo 03/20/2023: 1. Left ventricular ejection fraction, by estimation, is 35 to 40%. The  left ventricle has moderately decreased function. The left ventricle  demonstrates global hypokinesis. Left ventricular diastolic parameters are  consistent with Grade I diastolic  dysfunction (impaired relaxation).   2. Right ventricular systolic function is normal.   3. The mitral valve is normal in structure. Mild mitral valve  regurgitation.   4. The aortic valve is normal in structure. Aortic valve regurgitation is  not visualized.  __________  [facility] 12/27/2022: Conclusions: Severe native coronary artery disease, including 80% distal LMCA stenosis and chronic total occlusions of D2 and OM1.  Culprit disease for the patient's recurrent atypical angina and worsening LVEF is likely  sequential 50% and 90% stenoses in the proximal/mid RCA. Widely patent LIMA-D2 and SVG Y-graft to OM1 and OM4. Chronically occluded SVG-RPDA. Upper normal to mildly elevated left heart and pulmonary artery pressures. Mildly elevated right heart filling pressure. Normal Fick cardiac output/index. Successful PCI to proximal/mid RCA using Onyx Frontier 3.0 x 30 mm drug-eluting stent (postdilated to 3.6 mm) with 0% residual stenosis and TIMI-3 flow.   Recommendations: Overnight observation. Dual antiplatelet therapy with aspirin  and clopidogrel  for at least 6 months, potentially longer.  Consider resumption of apixaban  with history of PAF at the discretion of electrophysiology (upcoming visit next month).  If apixaban  is restarted, would recommend discontinuation of aspirin . Aggressive secondary prevention of coronary artery disease.  Continue Praluent  and lieu of statin given intolerance of statins secondary to myopathy. Continue outpatient escalation of goal-directed medical therapy for heart failure with mildly reduced ejection fraction as tolerated. __________   2D echo 12/13/2022: 1. Left ventricular ejection fraction, by estimation, is 40 to 45%. Left  ventricular ejection fraction by 3D volume is 43 %. The left ventricle has  mildly decreased function. The left ventricle demonstrates mild global  hypokinesis with moderate inferior   wall hypokinesis. The left ventricular internal cavity size was mildly  dilated. Left ventricular diastolic parameters are consistent with Grade I  diastolic dysfunction (impaired relaxation). The average left ventricular  global longitudinal strain is  -9.5 %.   2. Right ventricular systolic function is normal. The right ventricular  size is normal.   3. Left atrial size was mildly dilated.   4. The mitral valve is normal in structure. Mild mitral valve  regurgitation. No evidence of mitral stenosis.   5. The aortic valve is tricuspid. Aortic valve  regurgitation is mild. No  aortic stenosis is present.   6. There is mild dilatation of the ascending aorta, measuring 42 mm.   7. The inferior vena cava is normal in size with greater than 50%  respiratory variability, suggesting right atrial pressure of 3  mmHg.  __________   2D echo 08/10/2020: 1. Left ventricular ejection fraction, by estimation, is 50 to 55%. The  left ventricle has low normal function. The left ventricle has no regional  wall motion abnormalities. The left ventricular internal cavity size was  mildly dilated. Left ventricular  diastolic parameters are indeterminate. The average left ventricular  global longitudinal strain is -14.0 %. The global longitudinal strain is  abnormal.   2. Right ventricular systolic function is normal. The right ventricular  size is normal. Tricuspid regurgitation signal is inadequate for assessing  PA pressure.   3. Left atrial size was mildly dilated.   4. The mitral valve is normal in structure. Mild mitral valve  regurgitation. No evidence of mitral stenosis.   5. The aortic valve is normal in structure. Aortic valve regurgitation is  trivial. Mild aortic valve sclerosis is present, with no evidence of  aortic valve stenosis.  __________   2D echo 03/02/2020: 1. Left ventricular ejection fraction, by estimation, is 30 to 35%. The  left ventricle has moderately decreased function. The left ventricle  demonstrates global hypokinesis with septal-lateral dyssynchrony due to  LBBB. There is mild left ventricular  hypertrophy. Left ventricular diastolic parameters are consistent with  Grade I diastolic dysfunction (impaired relaxation).   2. The mitral valve is normal in structure. Trivial mitral valve  regurgitation. No evidence of mitral stenosis.   3. The aortic valve is tricuspid. Aortic valve regurgitation is trivial.  No aortic stenosis is present.   4. Right ventricular systolic function is mildly reduced. The right  ventricular  size is normal. Tricuspid regurgitation signal is inadequate  for assessing PA pressure.   5. The inferior vena cava is normal in size with greater than 50%  respiratory variability, suggesting right atrial pressure of 3 mmHg.  __________   Intraoperative TEE 11/15/2019: POST-OP IMPRESSIONS  - Left Ventricle: The left ventricle is unchanged from pre-bypass.  - Aorta: The aorta appears unchanged from pre-bypass.  - Left Atrial Appendage: The left atrial appendage appears unchanged from  pre-bypass.  - Aortic Valve: The aortic valve appears unchanged from pre-bypass.  - Mitral Valve: The mitral valve appears unchanged from pre-bypass.  - Tricuspid Valve: The tricuspid valve appears unchanged from pre-bypass.  - Interatrial Septum: The interatrial septum appears unchanged from  pre-bypass.  - Pericardium: The pericardium appears unchanged from pre-bypass.  Impella 5.5 appropriately positioned ~4.5cm into LV. Removal under TEE  guidance  without complication. No new or worsening valvular or wall motion  abnormalities.  Patient remains with severe LV dilation with global hypokinesis. Mild  secondary  MR present unchanged from prior scan.  ___________   Limited echo 11/14/2019: 1. Left ventricular ejection fraction, by estimation, is <20%. The left  ventricle has severely decreased function. The left ventricle demonstrates  global hypokinesis. Impella catheter in LV, positioned at 4.8 cm.   2. Right ventricular systolic function is moderately reduced. The right  ventricular size is normal.   3. Limited echo for Impella position.  __________   Limited echo 11/12/2019: 1. 2D echo done for Impella placement. LV function severely reduced.  Catheter tip is 4.8cm from AV.  __________   TEE 11/11/2019:    Left ventricle: Normal wall thickness. Cavity is severely dilated. Wall motion is abnormal.   Left atrium: Left atrial appendage filling and emptying velocities are decreased.   Aortic  valve: Mild valve thickening present. Mild valve calcification present. No stenosis.   Mitral valve:  Mild mitral annular  calcification. Mild regurgitation. The annulus is mildy.   Right ventricle:  Normal cavity size and ejection fraction.   Tricuspid valve: Trace regurgitation.  __________   2D echo 11/06/2019: 1. Poor quality images no parasternal/subcostal images.   2. Septal apical and inferior basal akinesis hypokinesis of mid and  apical inferior wall. Left ventricular ejection fraction, by estimation,  is 25 to 30%. The left ventricle has severely decreased function. The left  ventricle demonstrates regional wall  motion abnormalities (see scoring diagram/findings for description). The  left ventricular internal cavity size was severely dilated. Left  ventricular diastolic parameters were normal.   3. Right ventricular systolic function is normal. The right ventricular  size is normal.   4. The mitral valve is normal in structure. Mild mitral valve  regurgitation. No evidence of mitral stenosis.   5. The aortic valve was not well visualized. Aortic valve regurgitation  is not visualized. Mild aortic valve sclerosis is present, with no  evidence of aortic valve stenosis.   6. The inferior vena cava is normal in size with greater than 50%  respiratory variability, suggesting right atrial pressure of 3 mmHg.  __________   [facility] 11/05/2019: Conclusions: Multivessel CAD, including calcified 80% distal LMCA stenosis, small diffusely diseased distal LAD, chronically occluded OM1, and 50% mid RCA lesion. Widely patent mid LCx stent. Moderately elevated left heart filling pressure. Mildly to moderately elevated right heart filling pressure. Normal to mildly reduced cardiac output/index.   Recommendations: Transfer to [facility] for cardiac surgery consultation for CABG, given severe LMCA disease and low LVEF. Hold clopidogrel  pending cardiac surgery consultation. Obtain  echocardiogram. Aggressive secondary prevention. __________   See CV studies in Epic for more remote imaging   EKG:  EKG is ordered today.  The EKG ordered today demonstrates V-paced rhythm with artifact, 75 bpm  Recent Labs: 02/23/2023: ALT 19; B Natriuretic Peptide 125.9; BUN 28; Creatinine, Ser 1.01; Hemoglobin 14.1; Platelets 151; Potassium 4.3; Sodium 134  Recent Lipid Panel    Component Value Date/Time   CHOL 135 03/22/2022 0839   TRIG 120 03/22/2022 0839   HDL 55 03/22/2022 0839   CHOLHDL 2.5 03/22/2022 0839   CHOLHDL 2.3 01/07/2020 1601   VLDL 32 01/07/2020 1601   LDLCALC 59 03/22/2022 0839    PHYSICAL EXAM:    VS:  BP 104/72 (BP Location: Left Arm, Patient Position: Sitting, Cuff Size: Normal)   Pulse 75   Ht 5' 6 (1.676 m)   Wt 181 lb 9.6 oz (82.4 kg)   SpO2 95%   BMI 29.31 kg/m   BMI: Body mass index is 29.31 kg/m.  Physical Exam Vitals reviewed.  Constitutional:      Appearance: He is well-developed.  HENT:     Head: Normocephalic and atraumatic.  Eyes:     General:        Right eye: No discharge.        Left eye: No discharge.  Cardiovascular:     Rate and Rhythm: Normal rate and regular rhythm.     Heart sounds: Normal heart sounds, S1 normal and S2 normal. Heart sounds not distant. No midsystolic click and no opening snap. No murmur heard.    No friction rub.  Pulmonary:     Effort: Pulmonary effort is normal. No respiratory distress.     Breath sounds: Normal breath sounds. No decreased breath sounds, wheezing, rhonchi or rales.  Chest:     Chest wall: No tenderness.  Abdominal:     General:  There is no distension.  Musculoskeletal:     Cervical back: Normal range of motion.     Right lower leg: No edema.     Left lower leg: No edema.  Skin:    General: Skin is warm and dry.     Nails: There is no clubbing.  Neurological:     Mental Status: He is alert and oriented to person, place, and time.  Psychiatric:        Speech: Speech normal.         Behavior: Behavior normal.        Thought Content: Thought content normal.        Judgment: Judgment normal.     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  Status post PCI/DES to the native RCA as outlined above with overall improvement in fatigue.  Continue DAPT with aspirin  and clopidogrel  without interruption for a minimum of 6 months, preferably longer.  Aggressive risk factor modification and secondary prevention with continuation of Praluent  and carvedilol .  No indication for further ischemic testing at this time.   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  Escalate GDMT with addition of spironolactone  12.5 mg daily with a follow-up BMP 1 week thereafter.  He will otherwise continue carvedilol  12.5 mg twice daily and Entresto  49/51 mg twice daily.  Precautions discussed.  Advised patient to avoid salt substitutes or seasonings that are high in potassium.  Suspect his cardiomyopathy is mixed in etiology in the context of ischemic heart disease and PVC burden.  Appreciate comanagement with EP.  PVCs: Most recent device interrogation with a 5.7% PVC burden.  Remains on carvedilol  and mexiletine with ongoing management per EP.  PAF: Noted following CABG.  After discussion with EP, A-fib has not been identified on device interrogations.  Therefore, the patient remains off OAC.  Ongoing management per EP.  HTN: Blood pressure is well-controlled in the office today.  Continue pharmacotherapy as outlined above.  HLD with statin intolerance: LDL 59.  Remains on Praluent .  PAD/carotid artery disease: No symptoms of lifestyle limiting claudication or nonhealing  wounds.  Remains on aspirin  and clopidogrel  as outlined above as well as PCSK9 inhibitor.  Followed by vascular surgery.     Disposition: F/u with Dr. Mady or an APP in 2 months, and EP as directed.   Medication Adjustments/Labs and Tests Ordered: Current medicines are reviewed at length with the patient today.  Concerns regarding medicines are outlined above. Medication changes, Labs and Tests ordered today are summarized above and listed in the Patient Instructions accessible in Encounters.   Signed, Bernardino Bring, PA-C 04/04/2023 9:13 AM     [facility] [place] [address] [place], [place] [address] [phone]

## 2023-04-04 ENCOUNTER — Encounter: Payer: Self-pay | Admitting: Physician Assistant

## 2023-04-04 ENCOUNTER — Ambulatory Visit: Payer: PPO | Attending: Physician Assistant | Admitting: Physician Assistant

## 2023-04-04 VITALS — BP 104/72 | HR 75 | Ht 66.0 in | Wt 181.6 lb

## 2023-04-04 DIAGNOSIS — I739 Peripheral vascular disease, unspecified: Secondary | ICD-10-CM | POA: Diagnosis not present

## 2023-04-04 DIAGNOSIS — E785 Hyperlipidemia, unspecified: Secondary | ICD-10-CM

## 2023-04-04 DIAGNOSIS — Z79899 Other long term (current) drug therapy: Secondary | ICD-10-CM | POA: Diagnosis not present

## 2023-04-04 DIAGNOSIS — I493 Ventricular premature depolarization: Secondary | ICD-10-CM | POA: Diagnosis not present

## 2023-04-04 DIAGNOSIS — Z951 Presence of aortocoronary bypass graft: Secondary | ICD-10-CM

## 2023-04-04 DIAGNOSIS — I5022 Chronic systolic (congestive) heart failure: Secondary | ICD-10-CM

## 2023-04-04 DIAGNOSIS — I6523 Occlusion and stenosis of bilateral carotid arteries: Secondary | ICD-10-CM

## 2023-04-04 DIAGNOSIS — I480 Paroxysmal atrial fibrillation: Secondary | ICD-10-CM | POA: Diagnosis not present

## 2023-04-04 DIAGNOSIS — I2510 Atherosclerotic heart disease of native coronary artery without angina pectoris: Secondary | ICD-10-CM | POA: Diagnosis not present

## 2023-04-04 DIAGNOSIS — I10 Essential (primary) hypertension: Secondary | ICD-10-CM

## 2023-04-04 DIAGNOSIS — Z789 Other specified health status: Secondary | ICD-10-CM

## 2023-04-04 DIAGNOSIS — I447 Left bundle-branch block, unspecified: Secondary | ICD-10-CM

## 2023-04-04 MED ORDER — SPIRONOLACTONE 25 MG PO TABS: 12.5000 mg | ORAL_TABLET | Freq: Every day | ORAL | 3 refills | Status: DC

## 2023-04-04 NOTE — Patient Instructions (Signed)
Medication Instructions:  Your physician recommends the following medication changes.  START TAKING: Spironolactone 12.5 mg daily  *If you need a refill on your cardiac medications before your next appointment, please call your pharmacy*   Lab Work: Your provider would like for you to return in 1 week to have the following labs drawn: BMeT.   Please go to [facility] [address] ([facility] [address], [place] [address] You do not need an appointment.  They are open from 8 am- 4:30 pm.  Lunch from 1:00 pm- 2:00 pm You DO NOT need to be fasting.   You may also go to one of the following [facility]:  [address] [place],  [place]  [address]  [place] Phone: [phone] Lab hours: Mon-Fri 8 am- 5 pm    Lunch 12 pm- 1 pm   If you have labs (blood work) drawn today and your tests are completely normal, you will receive your results only by: MyChart Message (if you have MyChart) OR A paper copy in the mail If you have any lab test that is abnormal or we need to change your treatment, we will call you to review the results.   Follow-Up: At [facility], you and your health needs are our priority.  As part of our continuing mission to provide you with exceptional heart care, we have created designated Provider Care Teams.  These Care Teams include your primary Cardiologist (physician) and Advanced Practice Providers (APPs -  Physician Assistants and Nurse Practitioners) who all work together to provide you with the care you need, when you need it.    Your next appointment:   2 month(s)  Provider:   You may see Yvonne Kendall, MD or one of the following Advanced Practice Providers on your designated Care Team:   Eula Listen, [place]

## 2023-04-05 DIAGNOSIS — M545 Low back pain, unspecified: Secondary | ICD-10-CM | POA: Diagnosis not present

## 2023-04-05 DIAGNOSIS — M533 Sacrococcygeal disorders, not elsewhere classified: Secondary | ICD-10-CM | POA: Diagnosis not present

## 2023-04-05 DIAGNOSIS — M25552 Pain in left hip: Secondary | ICD-10-CM | POA: Diagnosis not present

## 2023-04-06 ENCOUNTER — Encounter: Payer: Self-pay | Admitting: Internal Medicine

## 2023-04-06 ENCOUNTER — Ambulatory Visit (INDEPENDENT_AMBULATORY_CARE_PROVIDER_SITE_OTHER): Payer: PPO | Admitting: Internal Medicine

## 2023-04-06 VITALS — BP 120/76 | HR 74 | Ht 66.0 in | Wt 182.4 lb

## 2023-04-06 DIAGNOSIS — Z125 Encounter for screening for malignant neoplasm of prostate: Secondary | ICD-10-CM | POA: Diagnosis not present

## 2023-04-06 DIAGNOSIS — F5101 Primary insomnia: Secondary | ICD-10-CM

## 2023-04-06 DIAGNOSIS — G8929 Other chronic pain: Secondary | ICD-10-CM

## 2023-04-06 DIAGNOSIS — M109 Gout, unspecified: Secondary | ICD-10-CM | POA: Diagnosis not present

## 2023-04-06 DIAGNOSIS — Z1211 Encounter for screening for malignant neoplasm of colon: Secondary | ICD-10-CM

## 2023-04-06 DIAGNOSIS — Z0000 Encounter for general adult medical examination without abnormal findings: Secondary | ICD-10-CM | POA: Diagnosis not present

## 2023-04-06 DIAGNOSIS — I10 Essential (primary) hypertension: Secondary | ICD-10-CM | POA: Diagnosis not present

## 2023-04-06 DIAGNOSIS — D6869 Other thrombophilia: Secondary | ICD-10-CM

## 2023-04-06 DIAGNOSIS — M25552 Pain in left hip: Secondary | ICD-10-CM

## 2023-04-06 DIAGNOSIS — E782 Mixed hyperlipidemia: Secondary | ICD-10-CM | POA: Diagnosis not present

## 2023-04-06 DIAGNOSIS — Z95811 Presence of heart assist device: Secondary | ICD-10-CM

## 2023-04-06 DIAGNOSIS — I5022 Chronic systolic (congestive) heart failure: Secondary | ICD-10-CM

## 2023-04-06 MED ORDER — TRAZODONE HCL 50 MG PO TABS: 50.0000 mg | ORAL_TABLET | Freq: Every evening | ORAL | 1 refills | Status: DC | PRN

## 2023-04-06 MED ORDER — COLCHICINE 0.6 MG PO TABS: 0.6000 mg | ORAL_TABLET | Freq: Two times a day (BID) | ORAL | 1 refills | Status: AC | PRN

## 2023-04-06 NOTE — Assessment & Plan Note (Signed)
 On Plavix  and ASA for CAD and PAD

## 2023-04-06 NOTE — Assessment & Plan Note (Signed)
 Now trying acupuncture

## 2023-04-06 NOTE — Assessment & Plan Note (Signed)
 On Praluent  - covered by a grant obtained by Cardiology.

## 2023-04-06 NOTE — Assessment & Plan Note (Signed)
 Stable on Entresto  and Coreg  Followed by Cardiology

## 2023-04-06 NOTE — Progress Notes (Addendum)
 Date:  04/06/2023   Name:  [name]   DOB:  01-Jul-1950   MRN:  [id number]   Chief Complaint: Annual Exam [name] is a 73 y.o. male who presents today for his Complete Annual Exam. He feels well. He reports exercising. He reports he is sleeping well.   Health Maintenance  Topic Date Due   DTaP/Tdap/Td vaccine (2 - Td or Tdap) 04/18/2022   COVID-19 Vaccine (6 - 2024-25 season) 04/22/2023*   Colon Cancer Screening  09/13/2023   Medicare Annual Wellness Visit  12/06/2023   Pneumonia Vaccine  Completed   Flu Shot  Completed   Hepatitis C Screening  Completed   Zoster (Shingles) Vaccine  Completed   HPV Vaccine  Aged Out  *Topic was postponed. The date shown is not the original due date.    Lab Results  Component Value Date   PSA1 0.5 03/22/2022   PSA1 0.5 03/18/2021   PSA1 0.9 05/21/2019   PSA 0.83 06/15/2015     Hypertension This is a chronic problem. The problem is controlled. Pertinent negatives include no chest pain, headaches, palpitations or shortness of breath. Past treatments include angiotensin blockers, diuretics and beta blockers. Hypertensive end-organ damage includes CAD/MI and heart failure. There is no history of CVA.  Hyperlipidemia This is a chronic problem. The problem is controlled. Pertinent negatives include no chest pain or shortness of breath. Treatments tried: Praluent .  Hip Pain  Incident onset: chronic longstanding problem. The pain is present in the left hip. The quality of the pain is described as aching and shooting. The pain is moderate. The pain has been Constant since onset. Treatments tried: multiple treatments - now trying Acupuncture.    Review of Systems  Constitutional:  Negative for chills, fatigue and unexpected weight change.  HENT:  Negative for nosebleeds and trouble swallowing.   Eyes:  Negative for visual disturbance.  Respiratory:  Negative for cough, chest tightness, shortness of breath and wheezing.   Cardiovascular:   Negative for chest pain, palpitations and leg swelling.  Gastrointestinal:  Negative for abdominal pain, constipation and diarrhea.  Genitourinary:  Positive for frequency. Negative for dysuria and urgency.  Musculoskeletal:  Positive for arthralgias and gait problem.  Skin:  Negative for color change and rash.  Neurological:  Negative for dizziness, weakness, light-headedness and headaches.  Psychiatric/Behavioral:  Negative for dysphoric mood and sleep disturbance. The patient is not nervous/anxious.      Lab Results  Component Value Date   NA 134 (L) 02/23/2023   K 4.3 02/23/2023   CO2 25 02/23/2023   GLUCOSE 97 02/23/2023   BUN 28 (H) 02/23/2023   CREATININE 1.01 02/23/2023   CALCIUM  8.8 (L) 02/23/2023   EGFR 83 12/15/2022   GFRNONAA >60 02/23/2023   Lab Results  Component Value Date   CHOL 135 03/22/2022   HDL 55 03/22/2022   LDLCALC 59 03/22/2022   TRIG 120 03/22/2022   CHOLHDL 2.5 03/22/2022   Lab Results  Component Value Date   TSH 2.510 03/22/2022   Lab Results  Component Value Date   HGBA1C 5.6 11/07/2019   Lab Results  Component Value Date   WBC 4.8 02/23/2023   HGB 14.1 02/23/2023   HCT 40.7 02/23/2023   MCV 97.1 02/23/2023   PLT 151 02/23/2023   Lab Results  Component Value Date   ALT 19 02/23/2023   AST 20 02/23/2023   ALKPHOS 78 02/23/2023   BILITOT 0.9 02/23/2023   Lab Results  Component Value Date   VD25OH 42.7 09/06/2017     Patient Active Problem List   Diagnosis Date Noted   Presence of heart assist device (HCC) 04/06/2023   Trochanteric bursitis of left hip 05/27/2022   Enthesopathy of left hip region 12/21/2021   Ischemia of left lower extremity 02/15/2021   Alcohol use 02/15/2021   Gouty arthritis of great toe 10/14/2020   Statin myopathy 05/05/2020   Acquired thrombophilia (HCC) 01/08/2020   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   Left bundle branch block 11/05/2019   Stricture and stenosis of esophagus    Polyp of colon    Rectal polyp     Carotid stenosis 07/17/2018   Chronic HFrEF (heart failure with reduced ejection fraction) (HCC) 03/12/2018   Dilated cardiomyopathy (HCC) 12/08/2017   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 (HCC) 05/01/2017   Foot pain, bilateral 05/01/2017   Degenerative disc disease, 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   History of tobacco use 07/13/2016   Overweight (BMI 25.0-29.9) 07/13/2016   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HCC) 07/13/2016   Dysphagia 11/26/2015   Chronic left hip pain 09/15/2015   Essential hypertension 08/08/2015   Coronary artery disease of native artery of native heart with stable angina pectoris (HCC)    Mixed hyperlipidemia 01/29/2013   History of CEA (carotid endarterectomy) 11/08/2011    Allergies  Allergen Reactions   Brilinta  [Ticagrelor ] Shortness Of Breath   Chlorhexidine  Gluconate Other (See Comments)    Skin burning for hours afterward   Contrast Media [Iodinated Contrast Media] Itching    Face and head flushing, nose itching after contrast administation for angiogram   Statins Other (See Comments)    Failed Crestor  5 mg twice weekly, Crestor  20 mg daily, Pravastatin 40 mg qd, Lipitor, Zocor - muscle aches   Zetia [Ezetimibe] Other (See Comments)    Muscle aches    Past Surgical History:  Procedure Laterality Date   ABDOMINAL AORTOGRAM W/LOWER EXTREMITY Left 01/24/2022   Procedure: ABDOMINAL AORTOGRAM W/LOWER EXTREMITY;  Surgeon: Sheree Penne Bruckner, MD;  Location: [facility] INVASIVE CV LAB;  Service: Cardiovascular;  Laterality: Left;   APPENDECTOMY     BACK SURGERY     BIV ICD INSERTION CRT-D N/A 03/23/2020   Procedure: BIV ICD INSERTION CRT-D;  Surgeon: Cindie Ole DASEN, MD;  Location: [facility] INVASIVE CV LAB;  Service: Cardiovascular;  Laterality: N/A;   CARDIAC CATHETERIZATION N/A 08/07/2015   Procedure: Left Heart Cath and Coronary Angiography;  Surgeon: Oneil JAYSON Parchment, MD;  Location: [facility] INVASIVE CV LAB;  Service: Cardiovascular;  Laterality: N/A;   CARDIAC  CATHETERIZATION N/A 08/07/2015   Procedure: Coronary Stent Intervention;  Surgeon: Oneil JAYSON Parchment, MD;  Location: [facility] INVASIVE CV LAB;  Service: Cardiovascular;  Laterality: N/A;   CARDIAC CATHETERIZATION N/A 08/07/2015   Procedure: Coronary Stent Intervention;  Surgeon: Peter M Jordan, MD;  Location: [facility] INVASIVE CV LAB;  Service: Cardiovascular;  Laterality: N/A;   CAROTID ENDARTERECTOMY  01/05/2006   Right  CEA with DPA   CATARACT EXTRACTION W/ INTRAOCULAR LENS IMPLANT Left 12/04/2017   CATARACT EXTRACTION W/PHACO Left 12/04/2017   Procedure: CATARACT EXTRACTION PHACO AND INTRAOCULAR LENS PLACEMENT (IOC) LEFT;  Surgeon: Myrna Adine Oneil, MD;  Location: [facility] SURGERY CNTR;  Service: Ophthalmology;  Laterality: Left;   CATARACT EXTRACTION W/PHACO Right 02/06/2018   Procedure: CATARACT EXTRACTION PHACO AND INTRAOCULAR LENS PLACEMENT (IOC)RIGHT;  Surgeon: Myrna Adine Oneil, MD;  Location: [facility] SURGERY CNTR;  Service: Ophthalmology;  Laterality: Right;   COLONOSCOPY  05/20/2008   COLONOSCOPY WITH  PROPOFOL  N/A 09/13/2018   Procedure: COLONOSCOPY WITH BIOPSY;  Surgeon: Jinny Carmine, MD;  Location: [facility] SURGERY CNTR;  Service: Endoscopy;  Laterality: N/A;   CORONARY ARTERY BYPASS GRAFT N/A 11/11/2019   Procedure: CORONARY ARTERY BYPASS GRAFTING (CABG) USING LIMA to Diag1; ENDOSCOPICALLY HARVESTED RIGHT GREATER SAPHENOUS VEIN: SVG to OM1; SVG to OM2; SVG to PDA.;  Surgeon: Fleeta Hanford Coy, MD;  Location: [facility] OR;  Service: Open Heart Surgery;  Laterality: N/A;   CORONARY STENT INTERVENTION N/A 12/27/2022   Procedure: CORONARY STENT INTERVENTION;  Surgeon: Mady Bruckner, MD;  Location: [facility] INVASIVE CV LAB;  Service: Cardiovascular;  Laterality: N/A;   CORONARY STENT PLACEMENT  08/07/2015   MID CIRCUMFLEX   ENDARTERECTOMY FEMORAL Bilateral 09/30/2020   Procedure: ENDARTERECTOMY FEMORAL;  Surgeon: Marea Selinda RAMAN, MD;  Location: [facility] ORS;  Service: Vascular;  Laterality: Bilateral;   ENDARTERECTOMY  POPLITEAL Left 01/25/2022   Procedure: ENDARTERECTOMY POPLITEAL;  Surgeon: Sheree Penne Bruckner, MD;  Location[facility] OR;  Service: Vascular;  Laterality: Left;   ENDOVEIN HARVEST OF GREATER SAPHENOUS VEIN Right 11/11/2019   Procedure: ENDOVEIN HARVEST OF GREATER SAPHENOUS VEIN;  Surgeon: Fleeta Hanford Coy, MD;  Location: [facility] OR;  Service: Open Heart Surgery;  Laterality: Right;   ESOPHAGOGASTRODUODENOSCOPY (EGD) WITH PROPOFOL  N/A 02/11/2019   Procedure: ESOPHAGOGASTRODUODENOSCOPY (EGD) WITH BIOPSY and  Dilation;  Surgeon: Jinny Carmine, MD;  Location: [facility] SURGERY CNTR;  Service: Endoscopy;  Laterality: N/A;   ESOPHAGOGASTRODUODENOSCOPY (EGD) WITH PROPOFOL  N/A 12/23/2021   Procedure: ESOPHAGOGASTRODUODENOSCOPY (EGD) WITH PROPOFOL ;  Surgeon: Jinny Carmine, MD;  Location: [facility] ENDOSCOPY;  Service: Endoscopy;  Laterality: N/A;   FEMORAL-FEMORAL BYPASS GRAFT Left 01/25/2022   Procedure: left Femoral to below the knee Popliteal bypass.;  Surgeon: Sheree Penne Bruckner, MD;  Location: [facility] OR;  Service: Vascular;  Laterality: Left;   HIP SURGERY Left 10/2016   left hip tendon repair   LEFT HEART CATH AND CORONARY ANGIOGRAPHY N/A 11/27/2017   Procedure: LEFT HEART CATH AND CORONARY ANGIOGRAPHY;  Surgeon: Darron Deatrice LABOR, MD;  Location: [facility] INVASIVE CV LAB;  Service: Cardiovascular;  Laterality: N/A;   LOWER EXTREMITY ANGIOGRAPHY Left 02/12/2018   Procedure: LOWER EXTREMITY ANGIOGRAPHY;  Surgeon: Marea Selinda RAMAN, MD;  Location: [facility] INVASIVE CV LAB;  Service: Cardiovascular;  Laterality: Left;   LOWER EXTREMITY ANGIOGRAPHY Left 03/07/2018   Procedure: LOWER EXTREMITY ANGIOGRAPHY;  Surgeon: Marea Selinda RAMAN, MD;  Location: [facility] INVASIVE CV LAB;  Service: Cardiovascular;  Laterality: Left;   LOWER EXTREMITY ANGIOGRAPHY Left 06/04/2018   Procedure: LOWER EXTREMITY ANGIOGRAPHY;  Surgeon: Marea Selinda RAMAN, MD;  Location: [facility] INVASIVE CV LAB;  Service: Cardiovascular;  Laterality: Left;   LOWER EXTREMITY ANGIOGRAPHY  Left 09/17/2020   Procedure: LOWER EXTREMITY ANGIOGRAPHY;  Surgeon: Marea Selinda RAMAN, MD;  Location: [facility] INVASIVE CV LAB;  Service: Cardiovascular;  Laterality: Left;   LOWER EXTREMITY ANGIOGRAPHY Left 09/28/2020   Procedure: LOWER EXTREMITY ANGIOGRAPHY;  Surgeon: Marea Selinda RAMAN, MD;  Location: [facility] INVASIVE CV LAB;  Service: Cardiovascular;  Laterality: Left;   LOWER EXTREMITY ANGIOGRAPHY N/A 02/15/2021   Procedure: LOWER EXTREMITY ANGIOGRAPHY;  Surgeon: Marea Selinda RAMAN, MD;  Location: [facility] INVASIVE CV LAB;  Service: Cardiovascular;  Laterality: N/A;   LOWER EXTREMITY ANGIOGRAPHY Left 12/27/2021   Procedure: Lower Extremity Angiography;  Surgeon: Marea Selinda RAMAN, MD;  Location: [facility] INVASIVE CV LAB;  Service: Cardiovascular;  Laterality: Left;   LOWER EXTREMITY ANGIOGRAPHY Left 12/28/2021   Procedure: Lower Extremity Angiography;  Surgeon: Marea Selinda RAMAN, MD;  Location: [facility] INVASIVE CV  LAB;  Service: Cardiovascular;  Laterality: Left;   PLACEMENT OF IMPELLA LEFT VENTRICULAR ASSIST DEVICE N/A 11/11/2019   Procedure: PLACEMENT OF IMPELLA LEFT VENTRICULAR ASSIST DEVICE 5.5;  Surgeon: Fleeta Hanford Coy, MD;  Location[facility] OR;  Service: Open Heart Surgery;  Laterality: N/A;  Midline Sternotomy   POLYPECTOMY N/A 09/13/2018   Procedure: POLYPECTOMY;  Surgeon: Jinny Carmine, MD;  Location: [facility] SURGERY CNTR;  Service: Endoscopy;  Laterality: N/A;   POLYPECTOMY N/A 02/11/2019   Procedure: POLYPECTOMY;  Surgeon: Jinny Carmine, MD;  Location: [facility] SURGERY CNTR;  Service: Endoscopy;  Laterality: N/A;   REMOVAL OF IMPELLA LEFT VENTRICULAR ASSIST DEVICE N/A 11/15/2019   Procedure: REMOVAL OF IMPELLA 5.5 LEFT VENTRICULAR ASSIST DEVICE;  Surgeon: Fleeta Hanford Coy, MD;  Location: [facility] OR;  Service: Open Heart Surgery;  Laterality: N/A;   RIGHT/LEFT HEART CATH AND CORONARY ANGIOGRAPHY N/A 11/05/2019   Procedure: RIGHT/LEFT HEART CATH AND CORONARY ANGIOGRAPHY;  Surgeon: Mady Bruckner, MD;  Location: [facility] INVASIVE CV LAB;  Service:  Cardiovascular;  Laterality: N/A;   RIGHT/LEFT HEART CATH AND CORONARY/GRAFT ANGIOGRAPHY Bilateral 12/27/2022   Procedure: RIGHT/LEFT HEART CATH AND CORONARY/GRAFT ANGIOGRAPHY;  Surgeon: Mady Bruckner, MD;  Location: [facility] INVASIVE CV LAB;  Service: Cardiovascular;  Laterality: Bilateral;   SPINE SURGERY     TEE WITHOUT CARDIOVERSION N/A 11/11/2019   Procedure: TRANSESOPHAGEAL ECHOCARDIOGRAM (TEE);  Surgeon: Fleeta Hanford, Coy, MD;  Location: [facility] OR;  Service: Open Heart Surgery;  Laterality: N/A;   TEE WITHOUT CARDIOVERSION N/A 11/15/2019   Procedure: TRANSESOPHAGEAL ECHOCARDIOGRAM (TEE);  Surgeon: Fleeta Hanford, Coy, MD;  Location: [facility] OR;  Service: Open Heart Surgery;  Laterality: N/A;   TONSILLECTOMY      Social History   Tobacco Use   Smoking status: Former    Current packs/day: 0.00    Average packs/day: 1.3 packs/day for 36.3 years (45.5 ttl pk-yrs)    Types: Cigarettes    Start date: 02/28/1970    Quit date: 02/28/2005    Years since quitting: 18.1    Passive exposure: Never   Smokeless tobacco: Current    Types: Snuff   Tobacco comments:    occaisionally  Vaping Use   Vaping status: Never Used  Substance Use Topics   Alcohol use: Yes    Alcohol/week: 10.0 standard drinks of alcohol    Types: 10 Standard drinks or equivalent per week    Comment: wine/liquor weekly   Drug use: No     Medication list has been reviewed and updated.  Current Meds  Medication Sig   Alirocumab  (PRALUENT ) 150 MG/ML SOAJ INJECT 1 ML (150 MG TOTAL) INTO THE SKIN EVERY 14 (FOURTEEN) DAYS.   aspirin  EC 81 MG tablet Take 1 tablet (81 mg total) by mouth daily.   carvedilol  (COREG ) 12.5 MG tablet TAKE (1) TABLET BY MOUTH TWICE DAILY.   clobetasol (TEMOVATE) 0.05 % external solution Apply 1 Application topically daily.   clopidogrel  (PLAVIX ) 75 MG tablet Take 1 tablet (75 mg total) by mouth daily with breakfast.   desoximetasone (TOPICORT) 0.25 % cream Apply 1 Application topically 2 (two) times daily.    hydrOXYzine (ATARAX) 25 MG tablet Take 25 mg by mouth at bedtime.   levocetirizine (XYZAL) 5 MG tablet Take 5 mg by mouth daily.   methocarbamol (ROBAXIN) 500 MG tablet Take 500 mg by mouth. 1 po q occasionally as needed for pain/spasm. No more than one every few days.   mexiletine (MEXITIL ) 150 MG capsule TAKE 1 CAPSULE BY MOUTH TWICE A DAY  nitroGLYCERIN  (NITROSTAT ) 0.4 MG SL tablet Place 1 tablet (0.4 mg total) under the tongue every 5 (five) minutes as needed for chest pain.   oxyCODONE -acetaminophen  (PERCOCET) 5-325 MG tablet Take 1 tablet by mouth every 8 (eight) hours as needed.   sacubitril -valsartan  (ENTRESTO ) 49-51 MG Take 1 tablet by mouth 2 (two) times daily. NEEDS FOLLOW UP APPOINTMENT FOR MORE REFILLS   spironolactone  (ALDACTONE ) 25 MG tablet Take 0.5 tablets (12.5 mg total) by mouth daily.   traMADol  (ULTRAM ) 50 MG tablet Take by mouth.   [DISCONTINUED] colchicine  0.6 MG tablet TAKE 1 TABLET (0.6 MG TOTAL) BY MOUTH 2 (TWO) TIMES DAILY AS NEEDED (GOUT ATTACKS).   [DISCONTINUED] traZODone  (DESYREL ) 50 MG tablet Take 1 tablet (50 mg total) by mouth at bedtime as needed. for sleep       04/06/2023    8:08 AM 03/09/2023    8:42 AM 03/22/2022    7:57 AM 03/18/2021   10:20 AM  GAD 7 : Generalized Anxiety Score  Nervous, Anxious, on Edge 0 0 0 0  Control/stop worrying 0 0 0 0  Worry too much - different things 0 0 0 0  Trouble relaxing 0 0 0 0  Restless 0 0 0 0  Easily annoyed or irritable 0 0 0 0  Afraid - awful might happen 0 0 0 0  Total GAD 7 Score 0 0 0 0  Anxiety Difficulty Not difficult at all Not difficult at all Not difficult at all        04/06/2023    8:08 AM 03/09/2023    8:42 AM 12/06/2022    8:20 AM  Depression screen PHQ 2/9  Decreased Interest 0 0 0  Down, Depressed, Hopeless 0 0 0  PHQ - 2 Score 0 0 0  Altered sleeping 0  0  Tired, decreased energy 0  0  Change in appetite 0  0  Feeling bad or failure about yourself  0  0  Trouble concentrating 0  0   Moving slowly or fidgety/restless 0  0  Suicidal thoughts 0  0  PHQ-9 Score 0  0  Difficult doing work/chores Not difficult at all  Not difficult at all    BP Readings from Last 3 Encounters:  04/06/23 120/76  04/04/23 104/72  03/09/23 (!) 80/54    Physical Exam Vitals and nursing note reviewed.  Constitutional:      Appearance: Normal appearance. He is well-developed.  HENT:     Head: Normocephalic.     Right Ear: Tympanic membrane, ear canal and external ear normal.     Left Ear: Tympanic membrane, ear canal and external ear normal.     Nose: Nose normal.  Eyes:     Conjunctiva/sclera: Conjunctivae normal.     Pupils: Pupils are equal, round, and reactive to light.  Neck:     Thyroid : No thyromegaly.     Vascular: No carotid bruit.  Cardiovascular:     Rate and Rhythm: Normal rate and regular rhythm. Frequent Extrasystoles are present.    Heart sounds: Normal heart sounds.     Comments: Left ventricular assist device in left upper chest Pulmonary:     Effort: Pulmonary effort is normal.     Breath sounds: Normal breath sounds. No wheezing.  Chest:  Breasts:    Right: No mass.     Left: No mass.  Abdominal:     General: Bowel sounds are normal.     Palpations: Abdomen is soft.  Tenderness: There is no abdominal tenderness.  Musculoskeletal:     Cervical back: Normal range of motion and neck supple.     Left hip: Tenderness present. Decreased range of motion.     Right lower leg: No edema.     Left lower leg: No edema.  Lymphadenopathy:     Cervical: No cervical adenopathy.  Skin:    General: Skin is warm and dry.  Neurological:     General: No focal deficit present.     Mental Status: He is alert and oriented to person, place, and time.     Deep Tendon Reflexes: Reflexes are normal and symmetric.  Psychiatric:        Attention and Perception: Attention normal.        Mood and Affect: Mood normal.        Thought Content: Thought content normal.      Wt Readings from Last 3 Encounters:  04/06/23 182 lb 6.4 oz (82.7 kg)  04/04/23 181 lb 9.6 oz (82.4 kg)  03/09/23 183 lb (83 kg)    BP 120/76   Pulse 74   Ht 5' 6 (1.676 m)   Wt 182 lb 6.4 oz (82.7 kg)   SpO2 96%   BMI 29.44 kg/m   Assessment and Plan:  Problem List Items Addressed This Visit       Unprioritized   Mixed hyperlipidemia   On Praluent  - covered by a grant obtained by Cardiology.       Relevant Orders   Lipid panel   Essential hypertension (Chronic)   Controlled BP with normal exam. Current regimen is Coreg  and spironolactone  which was recently added. Labs for follow up next week Will continue same medications; encourage continued reduced sodium diet.       Relevant Orders   CBC with Differential/Platelet   Urinalysis, Routine w reflex microscopic   Chronic left hip pain (Chronic)   Now trying acupuncture      Relevant Medications   traZODone  (DESYREL ) 50 MG tablet   Chronic HFrEF (heart failure with reduced ejection fraction) (HCC)   Stable on Entresto  and Coreg  Followed by Cardiology      Relevant Orders   Comprehensive metabolic panel   Acquired thrombophilia (HCC)   On Plavix  and ASA for CAD and PAD      Relevant Orders   CBC with Differential/Platelet   Gouty arthritis of great toe   Relevant Medications   colchicine  0.6 MG tablet   Presence of heart assist device [facility])   Other Visit Diagnoses       Annual physical exam    -  Primary   recommend Tdap with any injury     Prostate cancer screening       Relevant Orders   PSA     Colon cancer screening       due for repeat Colonoscopy this summer   Relevant Orders   Ambulatory referral to Gastroenterology     Primary insomnia       Relevant Medications   traZODone  (DESYREL ) 50 MG tablet       No follow-ups on file.    Leita HILARIO Adie, MD [facility] [place]

## 2023-04-06 NOTE — Assessment & Plan Note (Addendum)
 Controlled BP with normal exam. Current regimen is Coreg  and spironolactone  which was recently added. Labs for follow up next week Will continue same medications; encourage continued reduced sodium diet.

## 2023-04-11 DIAGNOSIS — M25552 Pain in left hip: Secondary | ICD-10-CM | POA: Diagnosis not present

## 2023-04-11 DIAGNOSIS — M545 Low back pain, unspecified: Secondary | ICD-10-CM | POA: Diagnosis not present

## 2023-04-11 DIAGNOSIS — M533 Sacrococcygeal disorders, not elsewhere classified: Secondary | ICD-10-CM | POA: Diagnosis not present

## 2023-04-13 DIAGNOSIS — Z125 Encounter for screening for malignant neoplasm of prostate: Secondary | ICD-10-CM | POA: Diagnosis not present

## 2023-04-13 DIAGNOSIS — I10 Essential (primary) hypertension: Secondary | ICD-10-CM | POA: Diagnosis not present

## 2023-04-13 DIAGNOSIS — D6869 Other thrombophilia: Secondary | ICD-10-CM | POA: Diagnosis not present

## 2023-04-13 DIAGNOSIS — I5022 Chronic systolic (congestive) heart failure: Secondary | ICD-10-CM | POA: Diagnosis not present

## 2023-04-13 DIAGNOSIS — E782 Mixed hyperlipidemia: Secondary | ICD-10-CM | POA: Diagnosis not present

## 2023-04-14 ENCOUNTER — Encounter: Payer: Self-pay | Admitting: Internal Medicine

## 2023-04-14 LAB — COMPREHENSIVE METABOLIC PANEL
ALT: 17 [IU]/L (ref 0–44)
AST: 16 [IU]/L (ref 0–40)
Albumin: 4.2 g/dL (ref 3.8–4.8)
Alkaline Phosphatase: 86 [IU]/L (ref 44–121)
BUN/Creatinine Ratio: 24 (ref 10–24)
BUN: 28 mg/dL — ABNORMAL HIGH (ref 8–27)
Bilirubin Total: 0.3 mg/dL (ref 0.0–1.2)
CO2: 25 mmol/L (ref 20–29)
Calcium: 9.5 mg/dL (ref 8.6–10.2)
Chloride: 104 mmol/L (ref 96–106)
Creatinine, Ser: 1.18 mg/dL (ref 0.76–1.27)
Globulin, Total: 2.4 g/dL (ref 1.5–4.5)
Glucose: 107 mg/dL — ABNORMAL HIGH (ref 70–99)
Potassium: 5 mmol/L (ref 3.5–5.2)
Sodium: 141 mmol/L (ref 134–144)
Total Protein: 6.6 g/dL (ref 6.0–8.5)
eGFR: 65 mL/min/{1.73_m2} (ref >59)

## 2023-04-14 LAB — CBC WITH DIFFERENTIAL/PLATELET
Basophils Absolute: 0.1 10*3/uL (ref 0.0–0.2)
Basos: 2 %
EOS (ABSOLUTE): 0.4 10*3/uL (ref 0.0–0.4)
Eos: 9 %
Hematocrit: 42.4 % (ref 37.5–51.0)
Hemoglobin: 14.4 g/dL (ref 13.0–17.7)
Immature Grans (Abs): 0 10*3/uL (ref 0.0–0.1)
Immature Granulocytes: 1 %
Lymphocytes Absolute: 1.4 10*3/uL (ref 0.7–3.1)
Lymphs: 33 %
MCH: 32.8 pg (ref 26.6–33.0)
MCHC: 34 g/dL (ref 31.5–35.7)
MCV: 97 fL (ref 79–97)
Monocytes Absolute: 0.6 10*3/uL (ref 0.1–0.9)
Monocytes: 14 %
Neutrophils Absolute: 1.7 10*3/uL (ref 1.4–7.0)
Neutrophils: 41 %
Platelets: 177 10*3/uL (ref 150–450)
RBC: 4.39 x10E6/uL (ref 4.14–5.80)
RDW: 11.4 % — ABNORMAL LOW (ref 11.6–15.4)
WBC: 4.2 10*3/uL (ref 3.4–10.8)

## 2023-04-14 LAB — URINALYSIS, ROUTINE W REFLEX MICROSCOPIC
Bilirubin, UA: NEGATIVE
Glucose, UA: NEGATIVE
Leukocytes,UA: NEGATIVE
Nitrite, UA: NEGATIVE
Protein,UA: NEGATIVE
RBC, UA: NEGATIVE
Specific Gravity, UA: 1.026 (ref 1.005–1.030)
Urobilinogen, Ur: 0.2 mg/dL (ref 0.2–1.0)
pH, UA: 5 (ref 5.0–7.5)

## 2023-04-14 LAB — LIPID PANEL
Chol/HDL Ratio: 3 {ratio} (ref 0.0–5.0)
Cholesterol, Total: 131 mg/dL (ref 100–199)
HDL: 44 mg/dL (ref >39)
LDL Chol Calc (NIH): 49 mg/dL (ref 0–99)
Triglycerides: 243 mg/dL — ABNORMAL HIGH (ref 0–149)
VLDL Cholesterol Cal: 38 mg/dL (ref 5–40)

## 2023-04-14 LAB — PSA: Prostate Specific Ag, Serum: 0.4 ng/mL (ref 0.0–4.0)

## 2023-04-17 ENCOUNTER — Other Ambulatory Visit: Payer: Self-pay | Admitting: Internal Medicine

## 2023-04-17 NOTE — Telephone Encounter (Signed)
Last office visit: 04/04/23 (continue carvedilol 12.5 mg twice daily and Entresto 49/51 mg twice daily) with plan to f/u in 2 months  Next office visit: 06/06/23

## 2023-04-21 ENCOUNTER — Encounter: Payer: Self-pay | Admitting: Cardiology

## 2023-04-21 ENCOUNTER — Ambulatory Visit: Payer: PPO | Attending: Cardiology | Admitting: Cardiology

## 2023-04-21 ENCOUNTER — Encounter: Payer: PPO | Admitting: Cardiology

## 2023-04-21 VITALS — BP 126/77 | HR 76 | Ht 66.0 in | Wt 184.0 lb

## 2023-04-21 DIAGNOSIS — I493 Ventricular premature depolarization: Secondary | ICD-10-CM

## 2023-04-21 DIAGNOSIS — I5022 Chronic systolic (congestive) heart failure: Secondary | ICD-10-CM | POA: Diagnosis not present

## 2023-04-21 DIAGNOSIS — Z95810 Presence of automatic (implantable) cardiac defibrillator: Secondary | ICD-10-CM

## 2023-04-21 DIAGNOSIS — I10 Essential (primary) hypertension: Secondary | ICD-10-CM | POA: Diagnosis not present

## 2023-04-21 DIAGNOSIS — T82897S Other specified complication of cardiac prosthetic devices, implants and grafts, sequela: Secondary | ICD-10-CM | POA: Diagnosis not present

## 2023-04-21 DIAGNOSIS — I25118 Atherosclerotic heart disease of native coronary artery with other forms of angina pectoris: Secondary | ICD-10-CM | POA: Diagnosis not present

## 2023-04-21 LAB — CUP PACEART INCLINIC DEVICE CHECK
Battery Remaining Longevity: 31 mo
Brady Statistic RA Percent Paced: 85 %
Brady Statistic RV Percent Paced: 88 %
Date Time Interrogation Session: 20250221151512
HighPow Impedance: 60.75 Ohm
Implantable Lead Connection Status: 753985
Implantable Lead Connection Status: 753985
Implantable Lead Connection Status: 753985
Implantable Lead Implant Date: 20220124
Implantable Lead Implant Date: 20220124
Implantable Lead Implant Date: 20220124
Implantable Lead Location: 753858
Implantable Lead Location: 753859
Implantable Lead Location: 753860
Implantable Pulse Generator Implant Date: 20220124
Lead Channel Impedance Value: 462.5 Ohm
Lead Channel Impedance Value: 475 Ohm
Lead Channel Impedance Value: 562.5 Ohm
Lead Channel Pacing Threshold Amplitude: 0.75 V
Lead Channel Pacing Threshold Amplitude: 0.75 V
Lead Channel Pacing Threshold Amplitude: 0.75 V
Lead Channel Pacing Threshold Amplitude: 2 V
Lead Channel Pacing Threshold Amplitude: 2.875 V
Lead Channel Pacing Threshold Pulse Width: 0.5 ms
Lead Channel Pacing Threshold Pulse Width: 0.5 ms
Lead Channel Pacing Threshold Pulse Width: 0.5 ms
Lead Channel Pacing Threshold Pulse Width: 0.5 ms
Lead Channel Pacing Threshold Pulse Width: 0.5 ms
Lead Channel Sensing Intrinsic Amplitude: 12 mV
Lead Channel Sensing Intrinsic Amplitude: 2.2 mV
Lead Channel Setting Pacing Amplitude: 1.75 V
Lead Channel Setting Pacing Amplitude: 2 V
Lead Channel Setting Pacing Amplitude: 2.5 V
Lead Channel Setting Pacing Pulse Width: 0.5 ms
Lead Channel Setting Pacing Pulse Width: 0.5 ms
Lead Channel Setting Sensing Sensitivity: 0.5 mV
Pulse Gen Serial Number: 810017138
Zone Setting Status: 755011

## 2023-04-21 NOTE — Progress Notes (Signed)
Electrophysiology Clinic Note    Date:  04/21/2023  Patient ID:  [name], [name] 06-23-1950, MRN [id number] PCP:  [name], MD  Cardiologist:  Yvonne Kendall, MD Electrophysiologist: Lanier Prude, MD   Discussed the use of AI scribe software for clinical note transcription with the patient, who gave verbal consent to proceed.   Patient Profile    Chief Complaint: palpitations  History of Present Illness: [name] is a 73 y.o. male with PMH notable for  HFrEF, mixed NICM and ICM, PVCs, s/p CRT-D, CAD s/p CABG then PCI, post-operative AFib, PAD ; seen today for Lanier Prude, MD for acute visit due to palpitations.    He has historically low BiV pacing d/t PVCs. He was trialed on amiodarone but patient stopped d/t feeling poorly. He has been on mexiletine, with plans to initiate sotalol if BiV pacing remained low.  He is s/p LHC > PCI 11/2022, LHC pursued d/t reduction in LVEF.  I last saw him 12/2022 where he had noticed increase energy since PCI placement, though he requested to stop mexiletine d/t fatigue. Mexitel was continued.    He saw PA Dunn more recently 04/2023 where he c/o palpitations that last several seconds, typically in the morning  On follow-up today, he complains of a fluttering sensation in his chest. The sensation is described as similar to the feeling after running a hundred yards. The patient reports that the sensation is worse in the mornings and has been waking him up, preventing him from going back to sleep. The sensation is not painful but is described as aggravating. The patient reports that the sensation started around Christmas and has been ongoing since then.  He continues to have good energy, denies chest pain, chest pressuer, No SOB, dizziness, or SOB.      Device Information: St. J CRT-D, 03/23/2020; dx ICM   AAD History: Amiodarone - 07/2021 - dc'd sometime prior to 11/2021. Pt felt poorly Mexiletine - started  11/2021    ROS:  Please see the history of present illness. All other systems are reviewed and otherwise negative.    Physical Exam    VS:  BP 126/77 (BP Location: Left Arm, Patient Position: Sitting)   Pulse 76   Ht 5\' 6"  (1.676 m)   Wt 184 lb (83.5 kg)   BMI 29.70 kg/m  BMI: Body mass index is 29.7 kg/m.  Wt Readings from Last 3 Encounters:  04/21/23 184 lb (83.5 kg)  04/06/23 182 lb 6.4 oz (82.7 kg)  04/04/23 181 lb 9.6 oz (82.4 kg)     GEN- The patient is well appearing, alert and oriented x 3 today.   Lungs- Clear to ausculation bilaterally, normal work of breathing.  Heart- Regular rate and rhythm with ectopy, no murmurs, rubs or gallops Extremities- No peripheral edema, warm, dry Skin-  device pocket well-healed, no tethering   Device interrogation done today and reviewed by myself:  Battery 2.6 years Testing of LV threshold immediately produced phrenic stim that patient endorses is the sensation he has been feeling.  BiV pacing 88% PVC burden 5.8% No changes made today   Studies Reviewed   Previous EP, cardiology notes.    EKG is ordered. Personal review of EKG from today shows:    EKG Interpretation Date/Time:  Friday April 21 2023 13:24:10 EST Ventricular Rate:  76 PR Interval:  164 QRS Duration:  154 QT Interval:  440 QTC Calculation: 495 R Axis:  187  Text Interpretation: AV dual-paced rhythm with occasional ventricular-paced complexes and with occasional Premature ventricular complexes Biventricular pacemaker detected Confirmed by Sherie Don [phone]) on 04/21/2023 3:09:03 PM    Limited TTE, 03/20/2023  1. Left ventricular ejection fraction, by estimation, is 35 to 40%. The left ventricle has moderately decreased function. The left ventricle demonstrates global hypokinesis. Left ventricular diastolic parameters are consistent with Grade I diastolic dysfunction (impaired relaxation).   2. Right ventricular systolic function is normal.   3. The  mitral valve is normal in structure. Mild mitral valve regurgitation.   4. The aortic valve is normal in structure. Aortic valve regurgitation is  not visualized.   Coronary stent intervention, 12/27/2022 Severe native coronary artery disease, including 80% distal LMCA stenosis and chronic total occlusions of D2 and OM1.  Culprit disease for the patient's recurrent atypical angina and worsening LVEF is likely sequential 50% and 90% stenoses in the proximal/mid RCA. Widely patent LIMA-D2 and SVG Y-graft to OM1 and OM4. Chronically occluded SVG-RPDA. Upper normal to mildly elevated left heart and pulmonary artery pressures. Mildly elevated right heart filling pressure. Normal Fick cardiac output/index. Successful PCI to proximal/mid RCA using Onyx Frontier 3.0 x 30 mm drug-eluting stent (postdilated to 3.6 mm) with 0% residual stenosis and TIMI-3 flow.   TTE, 12/13/2022  1. Left ventricular ejection fraction, by estimation, is 40 to 45%. Left ventricular ejection fraction by 3D volume is 43 %. The left ventricle has mildly decreased function. The left ventricle demonstrates mild global hypokinesis with moderate inferior  wall hypokinesis. The left ventricular internal cavity size was mildly dilated. Left ventricular diastolic parameters are consistent with Grade I diastolic dysfunction (impaired relaxation). The average left ventricular global longitudinal strain is -9.5 %.   2. Right ventricular systolic function is normal. The right ventricular size is normal.   3. Left atrial size was mildly dilated.   4. The mitral valve is normal in structure. Mild mitral valve regurgitation. No evidence of mitral stenosis.   5. The aortic valve is tricuspid. Aortic valve regurgitation is mild. No aortic stenosis is present.   6. There is mild dilatation of the ascending aorta, measuring 42 mm.   7. The inferior vena cava is normal in size with greater than 50% respiratory variability, suggesting right atrial  pressure of 3 mmHg.    TTE, 08/10/2020  1. Left ventricular ejection fraction, by estimation, is 50 to 55%. The left ventricle has low normal function. The left ventricle has no regional wall motion abnormalities. The left ventricular internal cavity size was mildly dilated. Left ventricular diastolic parameters are indeterminate. The average left ventricular global longitudinal strain is -14.0 %. The global longitudinal strain is abnormal.   2. Right ventricular systolic function is normal. The right ventricular size is normal. Tricuspid regurgitation signal is inadequate for assessing PA pressure.   3. Left atrial size was mildly dilated.   4. The mitral valve is normal in structure. Mild mitral valve regurgitation. No evidence of mitral stenosis.   5. The aortic valve is normal in structure. Aortic valve regurgitation is trivial. Mild aortic valve sclerosis is present, with no evidence of aortic valve stenosis.     Assessment and Plan      #) Phrenic Nerve Stimulation Patient reports chest fluttering, described as a "hiccup" sensation, likely due to phrenic nerve stimulation from the left-sided cardiac device wire. Noted to be more prominent in the mornings and can wake the patient from sleep. No associated pain. -Schedule a follow-up  appointment in the next 1-2 weeks to troubleshoot device settings with a device representative present. -Consider sleeping on the right side to potentially alleviate symptoms.#) HFrEF   #) PVC #) CAD s/p CABG > PCI #) CRT-D in situ Most recent TTE with further reduced LVEF, now 35-40% Good exercise tolerance. Warm and dry on exam Cleda Daub started at 12.5mg , tolerating well with stable labs GDMT - 49-51 entresto, 12.5 coreg, 12.5mg  spiro Consider stopping mexitil and starting renexa and magnesium         Current medicines are reviewed at length with the patient today.   The patient does not have concerns regarding his medicines.  The following changes  were made today:  none  Labs/ tests ordered today include:  Orders Placed This Encounter  Procedures   EKG 12-Lead     Disposition: Follow up with EP APP   next week    Signed, Sherie Don, NP  04/21/23  3:09 PM  Electrophysiology [facility]

## 2023-04-21 NOTE — Progress Notes (Deleted)
     Electrophysiology Clinic Note    Date:  04/21/2023  Patient ID:  [name], [name] April 08, 1950, MRN [id number] PCP:  [name], MD  Cardiologist:  Yvonne Kendall, MD Electrophysiologist: Lanier Prude, MD  ***refresh  Discussed the use of AI scribe software for clinical note transcription with the patient, who gave verbal consent to proceed.   Patient Profile    Chief Complaint: ***  History of Present Illness: [name] is a 73 y.o. male with PMH notable for  HFrEF, mixed NICM and ICM, PVCs, s/p CRT-D, CAD s/p CABG then PCI, post-operative AFib, PAD ; seen today for Lanier Prude, MD for acute visit due to ***.   He has historically low BiV pacing d/t PVCs. He was trialed on amiodarone but patient stopped d/t feeling poorly. He has been on mexiletine, with plans to initiate sotalol if BiV pacing remained low.  He is s/p LHC > PCI 11/2022, LHC pursued d/t reduction in LVEF.  I last saw him 12/2022 where he had noticed increase energy since PCI placement, though he requested to stop mexiletine d/t fatigue.   He saw PA Dunn more recently 04/2023 where he c/o palpitations that last several seconds, typically in the morning    Patient reports ***.  he denies chest pain, palpitations, dyspnea, PND, orthopnea, nausea, vomiting, dizziness, syncope, edema, weight gain, or early satiety.     Device Information: St. J CRT-D, 03/23/2020; dx ICM   AAD History: Amiodarone - 07/2021 - dc'd sometime prior to 11/2021. Pt felt poorly Mexiletine - 11/2021    ROS:  Please see the history of present illness. All other systems are reviewed and otherwise negative.    Physical Exam    VS:  There were no vitals taken for this visit. BMI: There is no height or weight on file to calculate BMI.  Wt Readings from Last 3 Encounters:  04/06/23 182 lb 6.4 oz (82.7 kg)  04/04/23 181 lb 9.6 oz (82.4 kg)  03/09/23 183 lb (83 kg)     GEN- The patient is well appearing, alert and  oriented x 3 today.   Lungs- Clear to ausculation bilaterally, normal work of breathing.  Heart- {Blank single:19197::"Regular","Irregularly irregular"} rate and rhythm, no murmurs, rubs or gallops Extremities- {EDEMA LEVEL:28147::"No"} peripheral edema, warm, dry Skin-  *** device pocket well-healed, no tethering   Device interrogation done today and reviewed by myself:  Battery *** Lead thresholds, impedence, sensing stable *** *** episodes *** changes made today   Studies Reviewed   Previous EP, cardiology notes.    EKG {ACTION; IS/IS YQM:[id number]} ordered. Personal review of EKG from {Blank single:19197::"today","***"} shows:  ***             Assessment and Plan     #) ***   #) ***   {Are you ordering a CV Procedure (e.g. stress test, cath, DCCV, TEE, etc)?   Press F2        :[id number]}   Current medicines are reviewed at length with the patient today.   The patient {ACTIONS; HAS/DOES NOT HAVE:19233} concerns regarding his medicines.  The following changes were made today:  {NONE DEFAULTED:18576}  Labs/ tests ordered today include: *** No orders of the defined types were placed in this encounter.    Disposition: Follow up with {EPMDS:28135} or EP APP {EPFOLLOW UP:28173}   Signed, Sherie Don, NP  04/21/23  10:47 AM  Electrophysiology [facility]

## 2023-04-21 NOTE — Patient Instructions (Signed)
Medication Instructions:  Your physician recommends that you continue on your current medications as directed. Please refer to the Current Medication list given to you today.   *If you need a refill on your cardiac medications before your next appointment, please call your pharmacy*   Lab Work: No labs ordered today    Testing/Procedures: No test ordered today    Follow-Up: At [facility], you and your health needs are our priority.  As part of our continuing mission to provide you with exceptional heart care, we have created designated Provider Care Teams.  These Care Teams include your primary Cardiologist (physician) and Advanced Practice Providers (APPs -  Physician Assistants and Nurse Practitioners) who all work together to provide you with the care you need, when you need it.  We recommend signing up for the patient portal called "MyChart".  Sign up information is provided on this After Visit Summary.  MyChart is used to connect with patients for Virtual Visits (Telemedicine).  Patients are able to view lab/test results, encounter notes, upcoming appointments, etc.  Non-urgent messages can be sent to your provider as well.   To learn more about what you can do with MyChart, go to [email or web address].    Your next appointment:   1 week(s)  Provider:   Sherie Don, NP

## 2023-04-25 ENCOUNTER — Encounter: Payer: Self-pay | Admitting: Cardiology

## 2023-04-26 NOTE — Progress Notes (Unsigned)
 Electrophysiology Clinic Note    Date:  04/27/2023  Patient ID:  [name], [name] 29-Jun-1950, MRN [id number] PCP:  [name], MD  Cardiologist:  Yvonne Kendall, MD Electrophysiologist: Lanier Prude, MD   Discussed the use of AI scribe software for clinical note transcription with the patient, who gave verbal consent to proceed.   Patient Profile    Chief Complaint: phrenic stimulation  History of Present Illness: [name] is a 73 y.o. male with PMH notable for  HFrEF, mixed NICM and ICM, PVCs, s/p CRT-D, CAD s/p CABG then PCI, post-operative AFib, PAD ; seen today for Lanier Prude, MD for acute visit due to palpitations.    He has historically low BiV pacing d/t PVCs. He was trialed on amiodarone but patient stopped d/t feeling poorly. He has been on mexiletine, with plans to initiate sotalol if BiV pacing remained low.  He is s/p LHC > PCI 11/2022, LHC pursued d/t reduction in LVEF.   I saw him last week d/t concerns for intermittent palpitations that started around 01/2023, found to have phrenic stim on LV wire. He presents today for device reprogramming  Today, he notes the phrenic stim has continued. He seems to notice it more when laying on R side, but also happens when sitting upright and when laying on L side.  He continues to be active, played golf last 3 days with the warmer weather.        Device Information: St. J CRT-D, 03/23/2020; dx ICM   AAD History: Amiodarone - 07/2021 - dc'd sometime prior to 11/2021. Pt felt poorly Mexiletine - started 11/2021    ROS:  Please see the history of present illness. All other systems are reviewed and otherwise negative.    Physical Exam    VS:  BP 102/60 (BP Location: Left Arm, Patient Position: Sitting, Cuff Size: Normal)   Pulse (!) 54   Ht 5\' 6"  (1.676 m)   Wt 183 lb (83 kg)   SpO2 97%   BMI 29.54 kg/m  BMI: Body mass index is 29.54 kg/m.  Wt Readings from Last 3 Encounters:  04/27/23  183 lb (83 kg)  04/21/23 184 lb (83.5 kg)  04/06/23 182 lb 6.4 oz (82.7 kg)     GEN- The patient is well appearing, alert and oriented x 3 today.   Lungs- Clear to ausculation bilaterally, normal work of breathing.  Heart- Regular rate and rhythm with ectopy, no murmurs, rubs or gallops Extremities- No peripheral edema, warm, dry Skin-  device pocket well-healed, no tethering   Device interrogation done today and reviewed by myself:   Extensive re-programing of device   Final programming of LV lead mid 2 - mid 3 resulted in no phrenic stim until 5mV at 0.31ms Threshold of LV lead at mid 2 - mid 3 is 2.161mV at 0.43ms  > with 0.55mV safety margin yielded the best battery with no phrenic stim  Final battery estimate is 2.8 years   Studies Reviewed   Previous EP, cardiology notes.    EKG is ordered. Personal review of EKG from today shows:    EKG Interpretation Date/Time:  Thursday April 27 2023 11:27:30 EST Ventricular Rate:  74 PR Interval:    QRS Duration:  150 QT Interval:  474 QTC Calculation: 526 R Axis:   187  Text Interpretation: Ventricular-paced rhythm with Premature ventricular complexes Biventricular pacemaker detected Confirmed by Sherie Don [phone] on 04/27/2023 11:51:37 AM  Limited TTE, 03/20/2023  1. Left ventricular ejection fraction, by estimation, is 35 to 40%. The left ventricle has moderately decreased function. The left ventricle demonstrates global hypokinesis. Left ventricular diastolic parameters are consistent with Grade I diastolic dysfunction (impaired relaxation).   2. Right ventricular systolic function is normal.   3. The mitral valve is normal in structure. Mild mitral valve regurgitation.   4. The aortic valve is normal in structure. Aortic valve regurgitation is not visualized.   Coronary stent intervention, 12/27/2022 Severe native coronary artery disease, including 80% distal LMCA stenosis and chronic total occlusions of D2 and OM1.   Culprit disease for the patient's recurrent atypical angina and worsening LVEF is likely sequential 50% and 90% stenoses in the proximal/mid RCA. Widely patent LIMA-D2 and SVG Y-graft to OM1 and OM4. Chronically occluded SVG-RPDA. Upper normal to mildly elevated left heart and pulmonary artery pressures. Mildly elevated right heart filling pressure. Normal Fick cardiac output/index. Successful PCI to proximal/mid RCA using Onyx Frontier 3.0 x 30 mm drug-eluting stent (postdilated to 3.6 mm) with 0% residual stenosis and TIMI-3 flow.   TTE, 12/13/2022  1. Left ventricular ejection fraction, by estimation, is 40 to 45%. Left ventricular ejection fraction by 3D volume is 43 %. The left ventricle has mildly decreased function. The left ventricle demonstrates mild global hypokinesis with moderate inferior  wall hypokinesis. The left ventricular internal cavity size was mildly dilated. Left ventricular diastolic parameters are consistent with Grade I diastolic dysfunction (impaired relaxation). The average left ventricular global longitudinal strain is -9.5 %.   2. Right ventricular systolic function is normal. The right ventricular size is normal.   3. Left atrial size was mildly dilated.   4. The mitral valve is normal in structure. Mild mitral valve regurgitation. No evidence of mitral stenosis.   5. The aortic valve is tricuspid. Aortic valve regurgitation is mild. No aortic stenosis is present.   6. There is mild dilatation of the ascending aorta, measuring 42 mm.   7. The inferior vena cava is normal in size with greater than 50% respiratory variability, suggesting right atrial pressure of 3 mmHg.    TTE, 08/10/2020  1. Left ventricular ejection fraction, by estimation, is 50 to 55%. The left ventricle has low normal function. The left ventricle has no regional wall motion abnormalities. The left ventricular internal cavity size was mildly dilated. Left ventricular diastolic parameters are  indeterminate. The average left ventricular global longitudinal strain is -14.0 %. The global longitudinal strain is abnormal.   2. Right ventricular systolic function is normal. The right ventricular size is normal. Tricuspid regurgitation signal is inadequate for assessing PA pressure.   3. Left atrial size was mildly dilated.   4. The mitral valve is normal in structure. Mild mitral valve regurgitation. No evidence of mitral stenosis.   5. The aortic valve is normal in structure. Aortic valve regurgitation is trivial. Mild aortic valve sclerosis is present, with no evidence of aortic valve stenosis.     Assessment and Plan     #) Phrenic Nerve Stimulation #) CRT-D  #) HFrEF #) PVC Extensive re-programming of device as above QRS on EKG after reprogramming is about , stable compared to EKGs in the past Phrenic stim started about the same time has patients RHC in 11/2022 raising the question of whether the LV lead was slightly dislodged during the RHC procedure  At this time, will repeat limited echo in about 2-3 months to see if LVEF remains reduced No medication changes  at this time, no plans to initiate sotalol with reduced LVEF       Current medicines are reviewed at length with the patient today.   The patient does not have concerns regarding his medicines.  The following changes were made today:  none  Labs/ tests ordered today include:  Orders Placed This Encounter  Procedures   EKG 12-Lead   ECHOCARDIOGRAM COMPLETE     Disposition: Follow up with Dr. Lalla Brothers or EP APP  in 3 months   I spent 45 minutes face-to-face with the patient reprogramming his device, providing counseling, and developing the follow-up plan.   Signed, Sherie Don, NP  04/27/23  11:52 AM  Electrophysiology [facility]

## 2023-04-27 ENCOUNTER — Encounter: Payer: Self-pay | Admitting: Cardiology

## 2023-04-27 ENCOUNTER — Ambulatory Visit: Payer: PPO | Admitting: Cardiology

## 2023-04-27 ENCOUNTER — Ambulatory Visit: Payer: PPO | Attending: Cardiology | Admitting: Cardiology

## 2023-04-27 VITALS — BP 102/60 | HR 54 | Ht 66.0 in | Wt 183.0 lb

## 2023-04-27 DIAGNOSIS — I5022 Chronic systolic (congestive) heart failure: Secondary | ICD-10-CM

## 2023-04-27 DIAGNOSIS — I10 Essential (primary) hypertension: Secondary | ICD-10-CM | POA: Diagnosis not present

## 2023-04-27 DIAGNOSIS — T82897S Other specified complication of cardiac prosthetic devices, implants and grafts, sequela: Secondary | ICD-10-CM | POA: Diagnosis not present

## 2023-04-27 DIAGNOSIS — Z95810 Presence of automatic (implantable) cardiac defibrillator: Secondary | ICD-10-CM | POA: Diagnosis not present

## 2023-04-27 LAB — CUP PACEART INCLINIC DEVICE CHECK
Battery Remaining Longevity: 33 mo
Brady Statistic RA Percent Paced: 85 %
Brady Statistic RV Percent Paced: 88 %
Date Time Interrogation Session: 20250227125019
HighPow Impedance: 65.25 Ohm
Implantable Lead Connection Status: 753985
Implantable Lead Connection Status: 753985
Implantable Lead Connection Status: 753985
Implantable Lead Implant Date: 20220124
Implantable Lead Implant Date: 20220124
Implantable Lead Implant Date: 20220124
Implantable Lead Location: 753858
Implantable Lead Location: 753859
Implantable Lead Location: 753860
Implantable Pulse Generator Implant Date: 20220124
Lead Channel Impedance Value: 450 Ohm
Lead Channel Impedance Value: 487.5 Ohm
Lead Channel Impedance Value: 925 Ohm
Lead Channel Pacing Threshold Amplitude: 0.75 V
Lead Channel Pacing Threshold Amplitude: 2.25 V
Lead Channel Pacing Threshold Amplitude: 2.75 V
Lead Channel Pacing Threshold Amplitude: 3 V
Lead Channel Pacing Threshold Pulse Width: 0.5 ms
Lead Channel Pacing Threshold Pulse Width: 0.5 ms
Lead Channel Pacing Threshold Pulse Width: 0.5 ms
Lead Channel Pacing Threshold Pulse Width: 0.5 ms
Lead Channel Sensing Intrinsic Amplitude: 1.9 mV
Lead Channel Sensing Intrinsic Amplitude: 12 mV
Lead Channel Setting Pacing Amplitude: 1.75 V
Lead Channel Setting Pacing Amplitude: 2 V
Lead Channel Setting Pacing Amplitude: 2.75 V
Lead Channel Setting Pacing Pulse Width: 0.5 ms
Lead Channel Setting Pacing Pulse Width: 0.5 ms
Lead Channel Setting Sensing Sensitivity: 0.5 mV
Pulse Gen Serial Number: 810017138
Zone Setting Status: 755011

## 2023-04-27 NOTE — Addendum Note (Signed)
 Addended by: Felecia Jan on: 04/27/2023 01:11 PM   Modules accepted: Orders

## 2023-04-27 NOTE — Patient Instructions (Signed)
 Medication Instructions:   Your physician recommends that you continue on your current medications as directed. Please refer to the Current Medication list given to you today.  *If you need a refill on your cardiac medications before your next appointment, please call your pharmacy*   Lab Work:  No labs ordered today  If you have labs (blood work) drawn today and your tests are completely normal, you will receive your results only by: MyChart Message (if you have MyChart) OR A paper copy in the mail If you have any lab test that is abnormal or we need to change your treatment, we will call you to review the results.   Testing/Procedures: Your physician has requested that you have an echocardiogram in 2-3 months . Echocardiography is a painless test that uses sound waves to create images of your heart. It provides your doctor with information about the size and shape of your heart and how well your heart's chambers and valves are working.   You may receive an ultrasound enhancing agent through an IV if needed to better visualize your heart during the echo. This procedure takes approximately one hour.  There are no restrictions for this procedure.  This will take place at [address] [facility] [address] [facility], [place] [address]  Please note: We ask at that you not bring children with you during ultrasound (echo/ vascular) testing. Due to room size and safety concerns, children are not allowed in the ultrasound rooms during exams. Our front office staff cannot provide observation of children in our lobby area while testing is being conducted. An adult accompanying a patient to their appointment will only be allowed in the ultrasound room at the discretion of the ultrasound technician under special circumstances. We apologize for any inconvenience.    Follow-Up: At [facility], you and your health needs are our priority.  As part of our continuing mission to provide you  with exceptional heart care, we have created designated Provider Care Teams.  These Care Teams include your primary Cardiologist (physician) and Advanced Practice Providers (APPs -  Physician Assistants and Nurse Practitioners) who all work together to provide you with the care you need, when you need it.  Your next appointment:   3 month(s)  Provider:   You may see Lanier Prude, MD or one of the following Advanced Practice Providers on your designated Care Team:   Francis Dowse, [place] "Mardelle Matte" [place], [place] Sherie Don, NP Canary Brim, NP    Other Instructions  Appointment after ECHO in  2-3 months

## 2023-04-30 ENCOUNTER — Encounter: Payer: Self-pay | Admitting: Cardiology

## 2023-05-02 NOTE — Progress Notes (Signed)
 Remote ICD transmission.

## 2023-05-17 ENCOUNTER — Ambulatory Visit: Payer: PPO | Admitting: Cardiology

## 2023-05-19 ENCOUNTER — Other Ambulatory Visit: Payer: PPO

## 2023-05-24 ENCOUNTER — Ambulatory Visit: Payer: PPO | Admitting: Cardiology

## 2023-06-02 ENCOUNTER — Telehealth: Payer: Self-pay

## 2023-06-02 NOTE — Telephone Encounter (Addendum)
 Alert received from [employer] for VT in the monitor zone event occurred 4/3 @ 14:43, duration 26sec, V>A, HR 181, onset of arrhythmia not present on EGM, arrhythmia self terminated to AP/BiV pace.  Route to triage.  Attempted to contact patient. No answer, left message to call back.

## 2023-06-05 NOTE — Telephone Encounter (Signed)
 Called and spoke with patient regarding VT episodes. Patient denied experiencing any symptoms around said event. Patient confirmed taking all his prescribed medications. Patient advised to call clinic if he is/has experienced any adverse symptoms. Patient was grateful for the call.

## 2023-06-06 ENCOUNTER — Encounter: Payer: Self-pay | Admitting: Internal Medicine

## 2023-06-06 ENCOUNTER — Ambulatory Visit: Payer: PPO | Admitting: Physician Assistant

## 2023-06-06 ENCOUNTER — Ambulatory Visit (INDEPENDENT_AMBULATORY_CARE_PROVIDER_SITE_OTHER): Admitting: Internal Medicine

## 2023-06-06 VITALS — BP 118/76 | HR 75 | Ht 66.0 in | Wt 181.5 lb

## 2023-06-06 DIAGNOSIS — H6121 Impacted cerumen, right ear: Secondary | ICD-10-CM | POA: Diagnosis not present

## 2023-06-06 DIAGNOSIS — H6123 Impacted cerumen, bilateral: Secondary | ICD-10-CM | POA: Diagnosis not present

## 2023-06-06 NOTE — Progress Notes (Signed)
 Date:  06/06/2023   Name:  [name]   DOB:  16-Nov-1950   MRN:  [id number]   Chief Complaint: Ear Fullness (Patient said his ears are full,can not hear very well, no fevers, no other symptoms )  HPI Ear fullness - both sides feel full and hearing is decreased.  He tried using a Q-tip but no benefit.  No other symptoms except feels out of sorts.  No dizziness, ear pain or headache.  Review of Systems  Constitutional:  Negative for chills, fatigue and fever.  HENT:  Positive for hearing loss. Negative for congestion, sinus pressure and sore throat.   Respiratory:  Negative for chest tightness and shortness of breath.   Cardiovascular:  Negative for chest pain.  Neurological:  Negative for dizziness, light-headedness and headaches.     Lab Results  Component Value Date   NA 141 04/13/2023   K 5.0 04/13/2023   CO2 25 04/13/2023   GLUCOSE 107 (H) 04/13/2023   BUN 28 (H) 04/13/2023   CREATININE 1.18 04/13/2023   CALCIUM 9.5 04/13/2023   EGFR 65 04/13/2023   GFRNONAA >60 02/23/2023   Lab Results  Component Value Date   CHOL 131 04/13/2023   HDL 44 04/13/2023   LDLCALC 49 04/13/2023   TRIG 243 (H) 04/13/2023   CHOLHDL 3.0 04/13/2023   Lab Results  Component Value Date   TSH 2.510 03/22/2022   Lab Results  Component Value Date   HGBA1C 5.6 11/07/2019   Lab Results  Component Value Date   WBC 4.2 04/13/2023   HGB 14.4 04/13/2023   HCT 42.4 04/13/2023   MCV 97 04/13/2023   PLT 177 04/13/2023   Lab Results  Component Value Date   ALT 17 04/13/2023   AST 16 04/13/2023   ALKPHOS 86 04/13/2023   BILITOT 0.3 04/13/2023   Lab Results  Component Value Date   VD25OH 42.7 09/06/2017     Patient Active Problem List   Diagnosis Date Noted   Presence of cardiac resynchronization therapy defibrillator (CRT-D) 04/27/2023   Presence of heart assist device (HCC) 04/06/2023   Trochanteric bursitis of left hip 05/27/2022   Enthesopathy of left hip region 12/21/2021    Ischemia of left lower extremity 02/15/2021   Alcohol use 02/15/2021   Gouty arthritis of great toe 10/14/2020   Statin myopathy 05/05/2020   Acquired thrombophilia (HCC) 01/08/2020   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   Left bundle branch block 11/05/2019   Stricture and stenosis of esophagus    Polyp of colon    Rectal polyp    Carotid stenosis 07/17/2018   Chronic HFrEF (heart failure with reduced ejection fraction) (HCC) 03/12/2018   Dilated cardiomyopathy (HCC) 12/08/2017   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 (HCC) 05/01/2017   Foot pain, bilateral 05/01/2017   Degenerative disc disease, 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   History of tobacco use 07/13/2016   Overweight (BMI 25.0-29.9) 07/13/2016   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HCC) 07/13/2016   Dysphagia 11/26/2015   Chronic left hip pain 09/15/2015   Essential hypertension 08/08/2015   Coronary artery disease of native artery of native heart with stable angina pectoris (HCC)    Mixed hyperlipidemia 01/29/2013   History of CEA (carotid endarterectomy) 11/08/2011    Allergies  Allergen Reactions   Brilinta [Ticagrelor] Shortness Of Breath   Chlorhexidine Gluconate Other (See Comments)    Skin burning for hours afterward   Contrast Media [Iodinated Contrast Media] Itching    Face and head  flushing, nose itching after contrast administation for angiogram   Statins Other (See Comments)    Failed Crestor 5 mg twice weekly, Crestor 20 mg daily, Pravastatin 40 mg qd, Lipitor, Zocor - muscle aches   Zetia [Ezetimibe] Other (See Comments)    Muscle aches    Past Surgical History:  Procedure Laterality Date   ABDOMINAL AORTOGRAM W/LOWER EXTREMITY Left 01/24/2022   Procedure: ABDOMINAL AORTOGRAM W/LOWER EXTREMITY;  Surgeon: Maeola Harman, MD;  Location: [facility] INVASIVE CV LAB;  Service: Cardiovascular;  Laterality: Left;   APPENDECTOMY     BACK SURGERY     BIV ICD INSERTION CRT-D N/A 03/23/2020   Procedure: BIV ICD INSERTION  CRT-D;  Surgeon: Lanier Prude, MD;  Location: [facility] INVASIVE CV LAB;  Service: Cardiovascular;  Laterality: N/A;   CARDIAC CATHETERIZATION N/A 08/07/2015   Procedure: Left Heart Cath and Coronary Angiography;  Surgeon: Jake Bathe, MD;  Location: [facility] INVASIVE CV LAB;  Service: Cardiovascular;  Laterality: N/A;   CARDIAC CATHETERIZATION N/A 08/07/2015   Procedure: Coronary Stent Intervention;  Surgeon: Jake Bathe, MD;  Location: [facility] INVASIVE CV LAB;  Service: Cardiovascular;  Laterality: N/A;   CARDIAC CATHETERIZATION N/A 08/07/2015   Procedure: Coronary Stent Intervention;  Surgeon: Peter M Swaziland, MD;  Location: [facility] INVASIVE CV LAB;  Service: Cardiovascular;  Laterality: N/A;   CAROTID ENDARTERECTOMY  01/05/2006   Right  CEA with DPA   CATARACT EXTRACTION W/ INTRAOCULAR LENS IMPLANT Left 12/04/2017   CATARACT EXTRACTION W/PHACO Left 12/04/2017   Procedure: CATARACT EXTRACTION PHACO AND INTRAOCULAR LENS PLACEMENT (IOC) LEFT;  Surgeon: Nevada Crane, MD;  Location: [facility] SURGERY CNTR;  Service: Ophthalmology;  Laterality: Left;   CATARACT EXTRACTION W/PHACO Right 02/06/2018   Procedure: CATARACT EXTRACTION PHACO AND INTRAOCULAR LENS PLACEMENT (IOC)RIGHT;  Surgeon: Nevada Crane, MD;  Location: [facility] SURGERY CNTR;  Service: Ophthalmology;  Laterality: Right;   COLONOSCOPY  05/20/2008   COLONOSCOPY WITH PROPOFOL N/A 09/13/2018   Procedure: COLONOSCOPY WITH BIOPSY;  Surgeon: Midge Minium, MD;  Location: [facility] SURGERY [facility];  Service: Endoscopy;  Laterality: N/A;   CORONARY ARTERY BYPASS GRAFT N/A 11/11/2019   Procedure: CORONARY ARTERY BYPASS GRAFTING (CABG) USING LIMA to Diag1; ENDOSCOPICALLY HARVESTED RIGHT GREATER SAPHENOUS VEIN: SVG to OM1; SVG to OM2; SVG to PDA.;  Surgeon: Kerin Perna, MD;  Location: [facility] OR;  Service: Open Heart Surgery;  Laterality: N/A;   CORONARY STENT INTERVENTION N/A 12/27/2022   Procedure: CORONARY STENT INTERVENTION;  Surgeon: Yvonne Kendall, MD;  Location:  [facility] INVASIVE CV LAB;  Service: Cardiovascular;  Laterality: N/A;   CORONARY STENT PLACEMENT  08/07/2015   MID CIRCUMFLEX   ENDARTERECTOMY FEMORAL Bilateral 09/30/2020   Procedure: ENDARTERECTOMY FEMORAL;  Surgeon: Annice Needy, MD;  Location: [facility] ORS;  Service: Vascular;  Laterality: Bilateral;   ENDARTERECTOMY POPLITEAL Left 01/25/2022   Procedure: ENDARTERECTOMY POPLITEAL;  Surgeon: Maeola Harman, MD;  Location: [facility] OR;  Service: Vascular;  Laterality: Left;   ENDOVEIN HARVEST OF GREATER SAPHENOUS VEIN Right 11/11/2019   Procedure: ENDOVEIN HARVEST OF GREATER SAPHENOUS VEIN;  Surgeon: Kerin Perna, MD;  Location: [facility] OR;  Service: Open Heart Surgery;  Laterality: Right;   ESOPHAGOGASTRODUODENOSCOPY (EGD) WITH PROPOFOL N/A 02/11/2019   Procedure: ESOPHAGOGASTRODUODENOSCOPY (EGD) WITH BIOPSY and  Dilation;  Surgeon: Midge Minium, MD;  Location: [facility] SURGERY CNTR;  Service: Endoscopy;  Laterality: N/A;   ESOPHAGOGASTRODUODENOSCOPY (EGD) WITH PROPOFOL N/A 12/23/2021   Procedure: ESOPHAGOGASTRODUODENOSCOPY (EGD) WITH PROPOFOL;  Surgeon: Midge Minium, MD;  Location: [facility]  ENDOSCOPY;  Service: Endoscopy;  Laterality: N/A;   FEMORAL-FEMORAL BYPASS GRAFT Left 01/25/2022   Procedure: left Femoral to below the knee Popliteal bypass.;  Surgeon: Maeola Harman, MD;  Location: [facility] OR;  Service: Vascular;  Laterality: Left;   HIP SURGERY Left 10/2016   left hip tendon repair   LEFT HEART CATH AND CORONARY ANGIOGRAPHY N/A 11/27/2017   Procedure: LEFT HEART CATH AND CORONARY ANGIOGRAPHY;  Surgeon: Iran Ouch, MD;  Location: [facility] INVASIVE CV LAB;  Service: Cardiovascular;  Laterality: N/A;   LOWER EXTREMITY ANGIOGRAPHY Left 02/12/2018   Procedure: LOWER EXTREMITY ANGIOGRAPHY;  Surgeon: Annice Needy, MD;  Location: [facility] INVASIVE CV LAB;  Service: Cardiovascular;  Laterality: Left;   LOWER EXTREMITY ANGIOGRAPHY Left 03/07/2018   Procedure: LOWER EXTREMITY ANGIOGRAPHY;  Surgeon: Annice Needy, MD;  Location: [facility] INVASIVE CV LAB;  Service: Cardiovascular;  Laterality: Left;   LOWER EXTREMITY ANGIOGRAPHY Left 06/04/2018   Procedure: LOWER EXTREMITY ANGIOGRAPHY;  Surgeon: Annice Needy, MD;  Location: [facility] INVASIVE CV LAB;  Service: Cardiovascular;  Laterality: Left;   LOWER EXTREMITY ANGIOGRAPHY Left 09/17/2020   Procedure: LOWER EXTREMITY ANGIOGRAPHY;  Surgeon: Annice Needy, MD;  Location: [facility] INVASIVE CV LAB;  Service: Cardiovascular;  Laterality: Left;   LOWER EXTREMITY ANGIOGRAPHY Left 09/28/2020   Procedure: LOWER EXTREMITY ANGIOGRAPHY;  Surgeon: Annice Needy, MD;  Location: [facility] INVASIVE CV LAB;  Service: Cardiovascular;  Laterality: Left;   LOWER EXTREMITY ANGIOGRAPHY N/A 02/15/2021   Procedure: LOWER EXTREMITY ANGIOGRAPHY;  Surgeon: Annice Needy, MD;  Location: [facility] INVASIVE CV LAB;  Service: Cardiovascular;  Laterality: N/A;   LOWER EXTREMITY ANGIOGRAPHY Left 12/27/2021   Procedure: Lower Extremity Angiography;  Surgeon: Annice Needy, MD;  Location: [facility] INVASIVE CV LAB;  Service: Cardiovascular;  Laterality: Left;   LOWER EXTREMITY ANGIOGRAPHY Left 12/28/2021   Procedure: Lower Extremity Angiography;  Surgeon: Annice Needy, MD;  Location: [facility] INVASIVE CV LAB;  Service: Cardiovascular;  Laterality: Left;   PLACEMENT OF IMPELLA LEFT VENTRICULAR ASSIST DEVICE N/A 11/11/2019   Procedure: PLACEMENT OF IMPELLA LEFT VENTRICULAR ASSIST DEVICE 5.5;  Surgeon: Kerin Perna, MD;  Location: [facility] OR;  Service: Open Heart Surgery;  Laterality: N/A;  Midline Sternotomy   POLYPECTOMY N/A 09/13/2018   Procedure: POLYPECTOMY;  Surgeon: Midge Minium, MD;  Location: [facility] SURGERY CNTR;  Service: Endoscopy;  Laterality: N/A;   POLYPECTOMY N/A 02/11/2019   Procedure: POLYPECTOMY;  Surgeon: Midge Minium, MD;  Location: [facility] SURGERY CNTR;  Service: Endoscopy;  Laterality: N/A;   REMOVAL OF IMPELLA LEFT VENTRICULAR ASSIST DEVICE N/A 11/15/2019   Procedure: REMOVAL OF IMPELLA 5.5 LEFT VENTRICULAR  ASSIST DEVICE;  Surgeon: Kerin Perna, MD;  Location: [facility] OR;  Service: Open Heart Surgery;  Laterality: N/A;   RIGHT/LEFT HEART CATH AND CORONARY ANGIOGRAPHY N/A 11/05/2019   Procedure: RIGHT/LEFT HEART CATH AND CORONARY ANGIOGRAPHY;  Surgeon: Yvonne Kendall, MD;  Location: [facility] INVASIVE CV LAB;  Service: Cardiovascular;  Laterality: N/A;   RIGHT/LEFT HEART CATH AND CORONARY/GRAFT ANGIOGRAPHY Bilateral 12/27/2022   Procedure: RIGHT/LEFT HEART CATH AND CORONARY/GRAFT ANGIOGRAPHY;  Surgeon: Yvonne Kendall, MD;  Location: [facility] INVASIVE CV LAB;  Service: Cardiovascular;  Laterality: Bilateral;   SPINE SURGERY     TEE WITHOUT CARDIOVERSION N/A 11/11/2019   Procedure: TRANSESOPHAGEAL ECHOCARDIOGRAM (TEE);  Surgeon: Donata Clay, Theron Arista, MD;  Location: [facility] OR;  Service: Open Heart Surgery;  Laterality: N/A;   TEE WITHOUT CARDIOVERSION N/A 11/15/2019   Procedure: TRANSESOPHAGEAL ECHOCARDIOGRAM (TEE);  Surgeon: Donata Clay, Theron Arista,  MD;  Location: [facility] OR;  Service: Open Heart Surgery;  Laterality: N/A;   TONSILLECTOMY      Social History   Tobacco Use   Smoking status: Former    Current packs/day: 0.00    Average packs/day: 1.3 packs/day for 36.3 years (45.4 ttl pk-yrs)    Types: Cigarettes    Start date: 02/28/1970    Quit date: 02/28/2005    Years since quitting: 18.2    Passive exposure: Never   Smokeless tobacco: Current    Types: Snuff   Tobacco comments:    occaisionally  Vaping Use   Vaping status: Never Used  Substance Use Topics   Alcohol use: Yes    Alcohol/week: 10.0 standard drinks of alcohol    Types: 10 Standard drinks or equivalent per week    Comment: wine/liquor weekly   Drug use: No     Medication list has been reviewed and updated.  Current Meds  Medication Sig   Alirocumab (PRALUENT) 150 MG/ML SOAJ INJECT 1 ML (150 MG TOTAL) INTO THE SKIN EVERY 14 (FOURTEEN) DAYS.   aspirin EC 81 MG tablet Take 1 tablet (81 mg total) by mouth daily.   carvedilol (COREG) 12.5 MG tablet  TAKE (1) TABLET BY MOUTH TWICE DAILY.   clobetasol (TEMOVATE) 0.05 % external solution Apply 1 Application topically daily.   clopidogrel (PLAVIX) 75 MG tablet Take 1 tablet (75 mg total) by mouth daily with breakfast.   colchicine 0.6 MG tablet Take 1 tablet (0.6 mg total) by mouth 2 (two) times daily as needed (gout attacks).   desoximetasone (TOPICORT) 0.25 % cream Apply 1 Application topically 2 (two) times daily.   hydrOXYzine (ATARAX) 25 MG tablet Take 25 mg by mouth at bedtime.   methocarbamol (ROBAXIN) 500 MG tablet Take 500 mg by mouth. 1 po q occasionally as needed for pain/spasm. No more than one every few days.   mexiletine (MEXITIL) 150 MG capsule TAKE 1 CAPSULE BY MOUTH TWICE A DAY   nitroGLYCERIN (NITROSTAT) 0.4 MG SL tablet Place 1 tablet (0.4 mg total) under the tongue every 5 (five) minutes as needed for chest pain.   oxyCODONE-acetaminophen (PERCOCET) 5-325 MG tablet Take 1 tablet by mouth every 8 (eight) hours as needed.   sacubitril-valsartan (ENTRESTO) 49-51 MG Take 1 tablet by mouth 2 (two) times daily. NEEDS FOLLOW UP APPOINTMENT FOR MORE REFILLS   spironolactone (ALDACTONE) 25 MG tablet Take 0.5 tablets (12.5 mg total) by mouth daily.   traMADol (ULTRAM) 50 MG tablet Take by mouth.   traZODone (DESYREL) 50 MG tablet Take 1 tablet (50 mg total) by mouth at bedtime as needed. for sleep       06/06/2023   10:27 AM 04/06/2023    8:08 AM 03/09/2023    8:42 AM 03/22/2022    7:57 AM  GAD 7 : Generalized Anxiety Score  Nervous, Anxious, on Edge 0 0 0 0  Control/stop worrying 0 0 0 0  Worry too much - different things 0 0 0 0  Trouble relaxing 0 0 0 0  Restless 0 0 0 0  Easily annoyed or irritable 0 0 0 0  Afraid - awful might happen 0 0 0 0  Total GAD 7 Score 0 0 0 0  Anxiety Difficulty Not difficult at all Not difficult at all Not difficult at all Not difficult at all       06/06/2023   10:27 AM 04/06/2023    8:08 AM 03/09/2023    8:42 AM  Depression screen PHQ 2/9   Decreased Interest 0 0 0  Down, Depressed, Hopeless 0 0 0  PHQ - 2 Score 0 0 0  Altered sleeping 0 0   Tired, decreased energy 0 0   Change in appetite 0 0   Feeling bad or failure about yourself  0 0   Trouble concentrating 0 0   Moving slowly or fidgety/restless 0 0   Suicidal thoughts 0 0   PHQ-9 Score 0 0   Difficult doing work/chores Not difficult at all Not difficult at all     BP Readings from Last 3 Encounters:  06/06/23 118/76  04/27/23 102/60  04/21/23 126/77    Physical Exam Vitals and nursing note reviewed.  Constitutional:      General: He is not in acute distress.    Appearance: Normal appearance. He is well-developed.  HENT:     Head: Normocephalic and atraumatic.     Right Ear: There is impacted cerumen.     Left Ear: There is impacted cerumen.     Ears:     Comments: Right ear flushed with complete clearing and resolution of symptoms  Left ear remains stopped up with significant cerumen    Nose:     Right Sinus: No maxillary sinus tenderness or frontal sinus tenderness.     Left Sinus: No maxillary sinus tenderness or frontal sinus tenderness.     Mouth/Throat:     Pharynx: Oropharynx is clear.  Cardiovascular:     Rate and Rhythm: Normal rate and regular rhythm.  Pulmonary:     Effort: Pulmonary effort is normal. No respiratory distress.     Breath sounds: No wheezing or rhonchi.  Musculoskeletal:     Cervical back: Normal range of motion.  Skin:    General: Skin is warm and dry.     Findings: No rash.  Neurological:     Mental Status: He is alert and oriented to person, place, and time.  Psychiatric:        Mood and Affect: Mood normal.        Behavior: Behavior normal.     Wt Readings from Last 3 Encounters:  06/06/23 181 lb 8 oz (82.3 kg)  04/27/23 183 lb (83 kg)  04/21/23 184 lb (83.5 kg)    BP 118/76   Pulse 75   Ht 5\' 6"  (1.676 m)   Wt 181 lb 8 oz (82.3 kg)   SpO2 97%   BMI 29.29 kg/m   Assessment and Plan:  Problem List  Items Addressed This Visit   None Visit Diagnoses       Bilateral hearing loss due to cerumen impaction    -  Primary   recommend ENT to clean left ear   Relevant Orders   Ambulatory referral to ENT       No follow-ups on file.    [name], MD [facility] [place]

## 2023-06-09 DIAGNOSIS — H900 Conductive hearing loss, bilateral: Secondary | ICD-10-CM | POA: Diagnosis not present

## 2023-06-09 DIAGNOSIS — H6123 Impacted cerumen, bilateral: Secondary | ICD-10-CM | POA: Diagnosis not present

## 2023-06-09 DIAGNOSIS — H9202 Otalgia, left ear: Secondary | ICD-10-CM | POA: Diagnosis not present

## 2023-06-14 DIAGNOSIS — H9202 Otalgia, left ear: Secondary | ICD-10-CM | POA: Diagnosis not present

## 2023-06-14 DIAGNOSIS — H6122 Impacted cerumen, left ear: Secondary | ICD-10-CM | POA: Diagnosis not present

## 2023-06-14 DIAGNOSIS — H900 Conductive hearing loss, bilateral: Secondary | ICD-10-CM | POA: Diagnosis not present

## 2023-06-20 ENCOUNTER — Ambulatory Visit (INDEPENDENT_AMBULATORY_CARE_PROVIDER_SITE_OTHER): Payer: PPO

## 2023-06-20 DIAGNOSIS — M76892 Other specified enthesopathies of left lower limb, excluding foot: Secondary | ICD-10-CM | POA: Diagnosis not present

## 2023-06-20 DIAGNOSIS — I447 Left bundle-branch block, unspecified: Secondary | ICD-10-CM | POA: Diagnosis not present

## 2023-06-21 LAB — CUP PACEART REMOTE DEVICE CHECK
Battery Remaining Longevity: 31 mo
Battery Remaining Percentage: 46 %
Battery Voltage: 2.93 V
Brady Statistic AP VP Percent: 86 %
Brady Statistic AP VS Percent: 3.5 %
Brady Statistic AS VP Percent: 3.3 %
Brady Statistic AS VS Percent: 1 %
Brady Statistic RA Percent Paced: 83 %
Date Time Interrogation Session: 20250422065934
HighPow Impedance: 62 Ohm
Implantable Lead Connection Status: 753985
Implantable Lead Connection Status: 753985
Implantable Lead Connection Status: 753985
Implantable Lead Implant Date: 20220124
Implantable Lead Implant Date: 20220124
Implantable Lead Implant Date: 20220124
Implantable Lead Location: 753858
Implantable Lead Location: 753859
Implantable Lead Location: 753860
Implantable Pulse Generator Implant Date: 20220124
Lead Channel Impedance Value: 460 Ohm
Lead Channel Impedance Value: 460 Ohm
Lead Channel Impedance Value: 930 Ohm
Lead Channel Pacing Threshold Amplitude: 0.625 V
Lead Channel Pacing Threshold Amplitude: 0.75 V
Lead Channel Pacing Threshold Amplitude: 2.75 V
Lead Channel Pacing Threshold Pulse Width: 0.5 ms
Lead Channel Pacing Threshold Pulse Width: 0.5 ms
Lead Channel Pacing Threshold Pulse Width: 0.5 ms
Lead Channel Sensing Intrinsic Amplitude: 12 mV
Lead Channel Sensing Intrinsic Amplitude: 2.2 mV
Lead Channel Setting Pacing Amplitude: 1.625
Lead Channel Setting Pacing Amplitude: 2 V
Lead Channel Setting Pacing Amplitude: 3.25 V
Lead Channel Setting Pacing Pulse Width: 0.5 ms
Lead Channel Setting Pacing Pulse Width: 0.5 ms
Lead Channel Setting Sensing Sensitivity: 0.5 mV
Pulse Gen Serial Number: 810017138
Zone Setting Status: 755011

## 2023-06-23 ENCOUNTER — Other Ambulatory Visit: Payer: Self-pay | Admitting: Cardiology

## 2023-06-23 ENCOUNTER — Other Ambulatory Visit (HOSPITAL_COMMUNITY): Payer: Self-pay | Admitting: Cardiology

## 2023-06-24 ENCOUNTER — Other Ambulatory Visit: Payer: Self-pay | Admitting: Cardiology

## 2023-06-24 ENCOUNTER — Encounter: Payer: Self-pay | Admitting: Cardiology

## 2023-06-27 ENCOUNTER — Ambulatory Visit: Payer: PPO

## 2023-06-27 DIAGNOSIS — Z95810 Presence of automatic (implantable) cardiac defibrillator: Secondary | ICD-10-CM | POA: Diagnosis not present

## 2023-06-27 DIAGNOSIS — T82897S Other specified complication of cardiac prosthetic devices, implants and grafts, sequela: Secondary | ICD-10-CM

## 2023-06-27 DIAGNOSIS — I10 Essential (primary) hypertension: Secondary | ICD-10-CM | POA: Diagnosis not present

## 2023-06-27 DIAGNOSIS — I5022 Chronic systolic (congestive) heart failure: Secondary | ICD-10-CM

## 2023-06-27 LAB — ECHOCARDIOGRAM LIMITED
AV Mean grad: 2 mmHg
AV Peak grad: 4.6 mmHg
Ao pk vel: 1.07 m/s
Area-P 1/2: 2.95 cm2
S' Lateral: 4.36 cm

## 2023-06-28 NOTE — Progress Notes (Signed)
 Electrophysiology Clinic Note    Date:  06/29/2023  Patient ID:  [name], Relyea Mar 23, 1950, MRN [id number] PCP:  Sheron Dixons, MD  Cardiologist:  [name], MD Electrophysiologist: Boyce Byes, MD   Discussed the use of AI scribe software for clinical note transcription with the patient, who gave verbal consent to proceed.   Patient Profile    Chief Complaint: PVC follow-up  History of Present Illness: [name] is a 73 y.o. male with PMH notable for  HFrEF, mixed NICM and ICM, PVCs, s/p CRT-D, CAD s/p CABG then PCI, post-operative AFib, PAD ; seen today for Boyce Byes, MD for acute visit due to palpitations.    He has historically low BiV pacing d/t PVCs. He was trialed on amiodarone  but patient stopped d/t feeling poorly. He has been on mexiletine, with plans to initiate sotalol if BiV pacing remained low.  He is s/p LHC > PCI 11/2022, LHC pursued d/t reduction in LVEF.  I last saw him 04/2023 where he c/o intermittent phrenic stim, device adjusted.   On follow-up today, he has had no further phrenic stim since our last appointment. He continues to be active playing golf, has trip planned to [facility] in the next few weeks for a [place] weekend of golf with friends. He denies chest pain, chest pressure, palpitations. He has had some weight gain, but thinks it's because he's eating too much. He has great appetite. Denies lower extremity edema or abd bloating.     Device Information: [employer] [facility] CRT-D, 03/23/2020; dx ICM   AAD History: Amiodarone  - 07/2021 - dc'd sometime prior to 11/2021. Pt felt poorly Mexiletine - started 11/2021    ROS:  Please see the history of present illness. All other systems are reviewed and otherwise negative.    Physical Exam    VS:  BP (!) 102/58 (BP Location: Left Arm, Patient Position: Sitting, Cuff Size: Normal)   Pulse 74   Ht 5\' 6"  (1.676 m)   Wt 175 lb 3.2 oz (79.5 kg)   SpO2 96%   BMI 28.28 kg/m  BMI: Body  mass index is 28.28 kg/m.  Wt Readings from Last 3 Encounters:  06/29/23 175 lb 3.2 oz (79.5 kg)  06/06/23 181 lb 8 oz (82.3 kg)  04/27/23 183 lb (83 kg)     GEN- The patient is well appearing, alert and oriented x 3 today.   Lungs- Clear to ausculation bilaterally, normal work of breathing.  Heart- Regular rate and rhythm with ectopy, no murmurs, rubs or gallops Extremities- No peripheral edema, warm, dry Skin-  device pocket well-healed, no tethering   Device interrogation done today and reviewed by myself:  Battery 2.6 years Lead thresholds, impedence, sensing stable  One VT episode lasting > , self terminated.  Episode fell into VT-1 monitor zone 90% BiV pacing No changes made today   Studies Reviewed   Previous EP, cardiology notes.    EKG is not ordered. Personal review of EKG from  04/27/2023  shows:        VP with rare PVC, rate 74bpm;  QRS   TTE, 06/27/2023  1. Left ventricular ejection fraction, by estimation, is 40 to 45%. Left ventricular ejection fraction by PLAX is 46 %. The left ventricle has mildly decreased function. The left ventricle demonstrates regional wall motion abnormalities (inferior wall hypokinesis). Left ventricular diastolic parameters are consistent with Grade I diastolic dysfunction (impaired relaxation). The average left ventricular global  longitudinal strain is -15.2 %. The global longitudinal strain is abnormal.   2. Right ventricular systolic function is normal. The right ventricular size is normal. Tricuspid regurgitation signal is inadequate for assessing PA pressure.   3. The mitral valve is normal in structure. Mild mitral valve regurgitation. No evidence of mitral stenosis.   4. The aortic valve is normal in structure. Aortic valve regurgitation is mild. Aortic valve sclerosis is present, with no evidence of aortic valve stenosis.   5. The inferior vena cava is normal in size with greater than 50% respiratory variability,  suggesting right atrial pressure of 3 mmHg.   Limited TTE, 03/20/2023  1. Left ventricular ejection fraction, by estimation, is 35 to 40%. The left ventricle has moderately decreased function. The left ventricle demonstrates global hypokinesis. Left ventricular diastolic parameters are consistent with Grade I diastolic dysfunction (impaired relaxation).   2. Right ventricular systolic function is normal.   3. The mitral valve is normal in structure. Mild mitral valve regurgitation.   4. The aortic valve is normal in structure. Aortic valve regurgitation is not visualized.   Coronary stent intervention, 12/27/2022 Severe native coronary artery disease, including 80% distal LMCA stenosis and chronic total occlusions of D2 and OM1.  Culprit disease for the patient's recurrent atypical angina and worsening LVEF is likely sequential 50% and 90% stenoses in the proximal/mid RCA. Widely patent LIMA-D2 and SVG Y-graft to OM1 and OM4. Chronically occluded SVG-RPDA. Upper normal to mildly elevated left heart and pulmonary artery pressures. Mildly elevated right heart filling pressure. Normal Fick cardiac output/index. Successful PCI to proximal/mid RCA using Onyx Frontier 3.0 x 30 mm drug-eluting stent (postdilated to 3.6 mm) with 0% residual stenosis and TIMI-3 flow.   TTE, 12/13/2022  1. Left ventricular ejection fraction, by estimation, is 40 to 45%. Left ventricular ejection fraction by 3D volume is 43 %. The left ventricle has mildly decreased function. The left ventricle demonstrates mild global hypokinesis with moderate inferior  wall hypokinesis. The left ventricular internal cavity size was mildly dilated. Left ventricular diastolic parameters are consistent with Grade I diastolic dysfunction (impaired relaxation). The average left ventricular global longitudinal strain is -9.5 %.   2. Right ventricular systolic function is normal. The right ventricular size is normal.   3. Left atrial size was  mildly dilated.   4. The mitral valve is normal in structure. Mild mitral valve regurgitation. No evidence of mitral stenosis.   5. The aortic valve is tricuspid. Aortic valve regurgitation is mild. No aortic stenosis is present.   6. There is mild dilatation of the ascending aorta, measuring 42 mm.   7. The inferior vena cava is normal in size with greater than 50% respiratory variability, suggesting right atrial pressure of 3 mmHg.    TTE, 08/10/2020  1. Left ventricular ejection fraction, by estimation, is 50 to 55%. The left ventricle has low normal function. The left ventricle has no regional wall motion abnormalities. The left ventricular internal cavity size was mildly dilated. Left ventricular diastolic parameters are indeterminate. The average left ventricular global longitudinal strain is -14.0 %. The global longitudinal strain is abnormal.   2. Right ventricular systolic function is normal. The right ventricular size is normal. Tricuspid regurgitation signal is inadequate for assessing PA pressure.   3. Left atrial size was mildly dilated.   4. The mitral valve is normal in structure. Mild mitral valve regurgitation. No evidence of mitral stenosis.   5. The aortic valve is normal in structure. Aortic valve  regurgitation is trivial. Mild aortic valve sclerosis is present, with no evidence of aortic valve stenosis.     Assessment and Plan     #) h/o Phrenic Nerve Stimulation #) CRT-D  #) HFrEF #) PVC No further phrenic stim since last OV Updated TTE with slightly improved LVEF (35-40 > 40-45%) BiV pacing remains low at 90% Patient feels well with good exercise and activity tolerance Single VT episode corresponds to a golf outing with friend where he endorses being dehydrated. He as asymptomatic of episode.  Recommend he make hydration priority especially while golfing and exercising outdoors No med changes at this time  Continue to follow-up with gen cards    Current  medicines are reviewed at length with the patient today.   The patient does not have concerns regarding his medicines.  The following changes were made today:  none  Labs/ tests ordered today include:  No orders of the defined types were placed in this encounter.    Disposition: Follow up with Dr. Marven Slimmer or EP APP  in 6 months Follow up with Dr. Nolan Battle or gen cards APP in 3 months    Signed, Kaavya Puskarich, NP  06/29/23  12:47 PM  Electrophysiology [facility]

## 2023-06-29 ENCOUNTER — Ambulatory Visit: Payer: PPO | Attending: Cardiology | Admitting: Cardiology

## 2023-06-29 VITALS — BP 102/58 | HR 74 | Ht 66.0 in | Wt 175.2 lb

## 2023-06-29 DIAGNOSIS — Z95810 Presence of automatic (implantable) cardiac defibrillator: Secondary | ICD-10-CM

## 2023-06-29 DIAGNOSIS — I472 Ventricular tachycardia, unspecified: Secondary | ICD-10-CM

## 2023-06-29 DIAGNOSIS — I493 Ventricular premature depolarization: Secondary | ICD-10-CM | POA: Diagnosis not present

## 2023-06-29 DIAGNOSIS — I5022 Chronic systolic (congestive) heart failure: Secondary | ICD-10-CM

## 2023-06-29 MED ORDER — SPIRONOLACTONE 25 MG PO TABS: 12.5000 mg | ORAL_TABLET | Freq: Every day | ORAL | 3 refills | Status: DC

## 2023-06-29 NOTE — Patient Instructions (Signed)
 Medication Instructions:  The current medical regimen is effective;  continue present plan and medications as directed. Please refer to the Current Medication list given to you today.   *If you need a refill on your cardiac medications before your next appointment, please call your pharmacy*  Follow-Up: At [facility], you and your health needs are our priority.  As part of our continuing mission to provide you with exceptional heart care, our providers are all part of one team.  This team includes your primary Cardiologist (physician) and Advanced Practice Providers or APPs (Physician Assistants and Nurse Practitioners) who all work together to provide you with the care you need, when you need it.  Your next appointment:   3 months with Dr.End.  6 months with Dr.Lambert.  We recommend signing up for the patient portal called "MyChart".  Sign up information is provided on this After Visit Summary.  MyChart is used to connect with patients for Virtual Visits (Telemedicine).  Patients are able to view lab/test results, encounter notes, upcoming appointments, etc.  Non-urgent messages can be sent to your provider as well.   To learn more about what you can do with MyChart, go to [email or web address].

## 2023-07-16 ENCOUNTER — Other Ambulatory Visit: Payer: Self-pay | Admitting: Internal Medicine

## 2023-07-17 ENCOUNTER — Other Ambulatory Visit: Payer: Self-pay | Admitting: Internal Medicine

## 2023-07-17 DIAGNOSIS — Z951 Presence of aortocoronary bypass graft: Secondary | ICD-10-CM

## 2023-07-17 DIAGNOSIS — E785 Hyperlipidemia, unspecified: Secondary | ICD-10-CM

## 2023-07-17 DIAGNOSIS — I2510 Atherosclerotic heart disease of native coronary artery without angina pectoris: Secondary | ICD-10-CM

## 2023-07-26 ENCOUNTER — Other Ambulatory Visit: Payer: Self-pay

## 2023-07-26 ENCOUNTER — Telehealth: Payer: Self-pay

## 2023-07-26 ENCOUNTER — Telehealth: Payer: Self-pay | Admitting: Internal Medicine

## 2023-07-26 DIAGNOSIS — Z860100 Personal history of colon polyps, unspecified: Secondary | ICD-10-CM

## 2023-07-26 MED ORDER — PEG 3350-KCL-NA BICARB-NACL 420 G PO SOLR
4000.0000 mL | Freq: Once | ORAL | 0 refills | Status: AC
Start: 2023-07-26 — End: 2023-07-26

## 2023-07-26 NOTE — Telephone Encounter (Signed)
 Spoke with patient and he he verbalized understanding and will call [facility] to get scheduled for colonoscopy.  Jm

## 2023-07-26 NOTE — Telephone Encounter (Signed)
 Gastroenterology Pre-Procedure Review  Request Date: 09/12/23 Requesting Physician: Dr. Ole Berkeley  PATIENT REVIEW QUESTIONS: The patient responded to the following health history questions as indicated:    1. Are you having any GI issues? no 2. Do you have a personal history of Polyps? yes (09/13/2018 Dr.Wohl) 3. Do you have a family history of Colon Cancer or Polyps? no 4. Diabetes Mellitus? no 5. Joint replacements in the past 12 months?no 6. Major health problems in the past 3 months?no 7. Any artificial heart valves, MVP, or defibrillator?yes (pacemaker-cardiac clearance sent to heartcare)    MEDICATIONS & ALLERGIES:    Patient reports the following regarding taking any anticoagulation/antiplatelet therapy:   Plavix , Coumadin, Eliquis , Xarelto, Lovenox , Pradaxa, Brilinta , or Effient? yes (Plavix -advise sent to heartcare) Aspirin ? yes (81 mg)  Patient confirms/reports the following medications:  Current Outpatient Medications  Medication Sig Dispense Refill   Alirocumab  (PRALUENT ) 150 MG/ML SOAJ INJECT 1 ML (150 MG TOTAL) INTO THE SKIN EVERY 14 (FOURTEEN) DAYS. 2 mL 5   aspirin  EC 81 MG tablet Take 1 tablet (81 mg total) by mouth daily. 150 tablet 2   carvedilol  (COREG ) 12.5 MG tablet TAKE 1 TABLET BY MOUTH TWICE DAILY. 180 tablet 3   clobetasol (TEMOVATE) 0.05 % external solution Apply 1 Application topically daily.     clopidogrel  (PLAVIX ) 75 MG tablet TAKE 1 TABLET BY MOUTH DAILY WITH BREAKFAST. 90 tablet 3   colchicine  0.6 MG tablet Take 1 tablet (0.6 mg total) by mouth 2 (two) times daily as needed (gout attacks). 180 tablet 1   desoximetasone (TOPICORT) 0.25 % cream Apply 1 Application topically 2 (two) times daily.     hydrOXYzine (ATARAX) 25 MG tablet Take 25 mg by mouth at bedtime.     methocarbamol (ROBAXIN) 500 MG tablet Take 500 mg by mouth. 1 po q occasionally as needed for pain/spasm. No more than one every few days.     mexiletine (MEXITIL ) 150 MG capsule TAKE 1 CAPSULE BY  MOUTH TWICE A DAY 180 capsule 3   nitroGLYCERIN  (NITROSTAT ) 0.4 MG SL tablet Place 1 tablet (0.4 mg total) under the tongue every 5 (five) minutes as needed for chest pain. 25 tablet 2   oxyCODONE -acetaminophen  (PERCOCET) 5-325 MG tablet Take 1 tablet by mouth every 8 (eight) hours as needed. 20 tablet 0   sacubitril -valsartan  (ENTRESTO ) 49-51 MG Take 1 tablet by mouth 2 (two) times daily. 180 tablet 3   spironolactone  (ALDACTONE ) 25 MG tablet Take 0.5 tablets (12.5 mg total) by mouth daily. 45 tablet 3   traMADol  (ULTRAM ) 50 MG tablet Take by mouth.     traZODone  (DESYREL ) 50 MG tablet Take 1 tablet (50 mg total) by mouth at bedtime as needed. for sleep 90 tablet 1   No current facility-administered medications for this visit.    Patient confirms/reports the following allergies:  Allergies  Allergen Reactions   Brilinta  [Ticagrelor ] Shortness Of Breath   Chlorhexidine  Gluconate Other (See Comments)    Skin burning for hours afterward   Contrast Media [Iodinated Contrast Media] Itching    Face and head flushing, nose itching after contrast administation for angiogram   Statins Other (See Comments)    Failed Crestor  5 mg twice weekly, Crestor  20 mg daily, Pravastatin 40 mg qd, Lipitor, Zocor - muscle aches   Zetia [Ezetimibe] Other (See Comments)    Muscle aches    No orders of the defined types were placed in this encounter.   AUTHORIZATION INFORMATION Primary Insurance: 1D#: Group #:  Secondary Insurance: 1D#: Group #:  SCHEDULE INFORMATION: Date: 09/12/2023 Time: Location:[facility]

## 2023-07-26 NOTE — Telephone Encounter (Signed)
   Pre-operative Risk Assessment    Patient Name: [name]  DOB: 02-Sep-1950 MRN: [id number]   Date of last office visit: 06/29/23 Date of next office visit: 10/19/23   Request for Surgical Clearance    Procedure:  colonoscopy  Date of Surgery:  Clearance 09/12/23                                Surgeon:  Marnee Sink, MD Surgeon's Group or Practice Name:  [facility] Phone number:  [phone] Fax number:  [phone]   Type of Clearance Requested:   - Pharmacy:  Hold Clopidogrel  (Plavix ) and Aspirin    Type of Anesthesia:  Not Indicated   Additional requests/questions:    Signed, Gwendolyn H Slade   07/26/2023, 2:27 PM

## 2023-07-26 NOTE — Telephone Encounter (Signed)
 Received referral for Colonoscopy-spoke with patient-he stated his wife recently did the Cologurad and he prefers to do the same- He was advised to call PCP. He stated if the cologurad was not an option that he would call us  back.

## 2023-07-26 NOTE — Telephone Encounter (Signed)
 Copied from CRM [id number]. Topic: Clinical - Request for Lab/Test Order >> Jul 26, 2023 12:46 PM Ary Bitter R wrote: Reason for CRM: Pt was contacted by [facility] to complete a colonoscopy but the pt prefers to do the Cologuard at home. Please follow up.

## 2023-07-27 ENCOUNTER — Telehealth: Payer: Self-pay

## 2023-07-27 NOTE — Telephone Encounter (Signed)
   Patient Name: [name]  DOB: 19-Jul-1950 MRN: [id number]  Primary Cardiologist: [name], MD  Chart reviewed as part of pre-operative protocol coverage.  He was last seen in the office on 06/29/2023 by Lyle San, NP, and was doing well.  Therefore, given past medical history and time since last visit, based on ACC/AHA guidelines, YAACOV KOZIOL is at acceptable risk for the planned procedure without further cardiovascular testing.   Per Dr. Nolan Battle, "Ideally, Mr. Semel should complete 12 months of dual antiplatelet therapy from the time of his last PCI in 11/2022.  If colonoscopy must be done sooner and he does not have any new cardiac symptoms, clopidogrel  can be held for 5-7 days before the procedure and restarted as soon as safe afterwards.  Aspirin  81 mg daily must be continued throughout the perioperative period.   [name], MD [facility]"  I will route this recommendation to the requesting party via Epic fax function and remove from pre-op pool.  Please call with questions.  Jude Norton, NP 07/27/2023, 11:04 AM

## 2023-07-27 NOTE — Telephone Encounter (Signed)
 Recev'd Plavix  and ASA clearance from Dr.End. patient is at acceptable risk-however he has not been on antiplatelet therapy Stamp enough from his PCI procedure in 2024. Recommend therapy for one year at least.Colonoscopy cancelled and patient notified. Message sent to PCP.

## 2023-07-27 NOTE — Telephone Encounter (Signed)
 Ideally, [name] should complete 12 months of dual antiplatelet therapy from the time of his last PCI in 11/2022.  If colonoscopy must be done sooner and he does not have any new cardiac symptoms, clopidogrel  can be held for 5-7 days before the procedure and restarted as soon as safe afterwards.  Aspirin  81 mg daily must be continued throughout the perioperative period.  [name], MD [facility]

## 2023-07-28 ENCOUNTER — Encounter: Payer: Self-pay | Admitting: Internal Medicine

## 2023-08-03 NOTE — Progress Notes (Signed)
 Remote ICD transmission.

## 2023-08-03 NOTE — Addendum Note (Signed)
 Addended by: Lott Rouleau A on: 08/03/2023 03:16 PM   Modules accepted: Orders

## 2023-08-18 ENCOUNTER — Other Ambulatory Visit: Payer: Self-pay | Admitting: Internal Medicine

## 2023-08-18 DIAGNOSIS — I2510 Atherosclerotic heart disease of native coronary artery without angina pectoris: Secondary | ICD-10-CM

## 2023-08-18 DIAGNOSIS — Z951 Presence of aortocoronary bypass graft: Secondary | ICD-10-CM

## 2023-08-18 DIAGNOSIS — E785 Hyperlipidemia, unspecified: Secondary | ICD-10-CM

## 2023-08-21 ENCOUNTER — Other Ambulatory Visit (HOSPITAL_COMMUNITY): Payer: Self-pay

## 2023-08-21 ENCOUNTER — Telehealth: Payer: Self-pay | Admitting: Pharmacy Technician

## 2023-08-21 NOTE — Telephone Encounter (Signed)
 Pharmacy Patient Advocate Encounter   Received notification from Fax that prior authorization for Repatha is required/requested.   Insurance verification completed.   The patient is insured through [employer] .   Per test claim: PA required; PA submitted to above mentioned insurance via CoverMyMeds Key/confirmation #/EOC [id number] Status is pending

## 2023-08-21 NOTE — Telephone Encounter (Signed)
 Pharmacy Patient Advocate Encounter  Received notification from [facility] that Prior Authorization for Repatha has been APPROVED from 08/21/23 to 02/20/24. Ran test claim, Copay is $0.00- 3 months. This test claim was processed through [facility]- copay amounts may vary at other pharmacies due to pharmacy/plan contracts, or as the patient moves through the different stages of their insurance plan.   PA #/Case ID/Reference #: [id number]

## 2023-09-12 ENCOUNTER — Ambulatory Visit: Admit: 2023-09-12 | Admitting: Gastroenterology

## 2023-09-12 SURGERY — COLONOSCOPY
Anesthesia: General

## 2023-09-19 ENCOUNTER — Ambulatory Visit (INDEPENDENT_AMBULATORY_CARE_PROVIDER_SITE_OTHER)

## 2023-09-19 DIAGNOSIS — I447 Left bundle-branch block, unspecified: Secondary | ICD-10-CM | POA: Diagnosis not present

## 2023-09-20 LAB — CUP PACEART REMOTE DEVICE CHECK
Battery Remaining Longevity: 28 mo
Battery Remaining Percentage: 42 %
Battery Voltage: 2.93 V
Brady Statistic AP VP Percent: 89 %
Brady Statistic AP VS Percent: 2.2 %
Brady Statistic AS VP Percent: 4.4 %
Brady Statistic AS VS Percent: 1 %
Brady Statistic RA Percent Paced: 88 %
Date Time Interrogation Session: 20250722064908
HighPow Impedance: 63 Ohm
Implantable Lead Connection Status: 753985
Implantable Lead Connection Status: 753985
Implantable Lead Connection Status: 753985
Implantable Lead Implant Date: 20220124
Implantable Lead Implant Date: 20220124
Implantable Lead Implant Date: 20220124
Implantable Lead Location: 753858
Implantable Lead Location: 753859
Implantable Lead Location: 753860
Implantable Pulse Generator Implant Date: 20220124
Lead Channel Impedance Value: 450 Ohm
Lead Channel Impedance Value: 450 Ohm
Lead Channel Impedance Value: 850 Ohm
Lead Channel Pacing Threshold Amplitude: 0.5 V
Lead Channel Pacing Threshold Amplitude: 0.625 V
Lead Channel Pacing Threshold Amplitude: 2.75 V
Lead Channel Pacing Threshold Pulse Width: 0.5 ms
Lead Channel Pacing Threshold Pulse Width: 0.5 ms
Lead Channel Pacing Threshold Pulse Width: 0.5 ms
Lead Channel Sensing Intrinsic Amplitude: 12 mV
Lead Channel Sensing Intrinsic Amplitude: 2.2 mV
Lead Channel Setting Pacing Amplitude: 1.625
Lead Channel Setting Pacing Amplitude: 2 V
Lead Channel Setting Pacing Amplitude: 3.25 V
Lead Channel Setting Pacing Pulse Width: 0.5 ms
Lead Channel Setting Pacing Pulse Width: 0.5 ms
Lead Channel Setting Sensing Sensitivity: 0.5 mV
Pulse Gen Serial Number: 810017138
Zone Setting Status: 755011

## 2023-09-21 ENCOUNTER — Ambulatory Visit: Payer: Self-pay | Admitting: Cardiology

## 2023-09-25 ENCOUNTER — Other Ambulatory Visit: Payer: Self-pay | Admitting: Internal Medicine

## 2023-09-25 DIAGNOSIS — F5101 Primary insomnia: Secondary | ICD-10-CM

## 2023-09-26 NOTE — Telephone Encounter (Signed)
 Requested Prescriptions  Pending Prescriptions Disp Refills   traZODone  (DESYREL ) 50 MG tablet [Pharmacy Med Name: TRAZODONE  50 MG TABLET] 90 tablet 1    Sig: TAKE 1 TABLET (50 MG TOTAL) BY MOUTH AT BEDTIME AS NEEDED. FOR SLEEP     Psychiatry: Antidepressants - Serotonin Modulator Passed - 09/26/2023 11:50 AM      Passed - Valid encounter within last 6 months    Recent Outpatient Visits           3 months ago Bilateral hearing loss due to cerumen impaction   [facility] at [facility], Leita DEL, MD   5 months ago Annual physical exam   [facility] at [facility], Leita DEL, MD       Future Appointments             In 3 weeks End, Lonni, MD [facility] at [place]   In 6 months Manya, Toribio SQUIBB, PA [facility] at [facility]

## 2023-10-19 ENCOUNTER — Ambulatory Visit: Attending: Internal Medicine | Admitting: Internal Medicine

## 2023-10-19 ENCOUNTER — Encounter: Payer: Self-pay | Admitting: Internal Medicine

## 2023-10-19 VITALS — BP 92/52 | HR 74 | Ht 66.0 in | Wt 179.2 lb

## 2023-10-19 DIAGNOSIS — I2510 Atherosclerotic heart disease of native coronary artery without angina pectoris: Secondary | ICD-10-CM | POA: Diagnosis not present

## 2023-10-19 DIAGNOSIS — I5022 Chronic systolic (congestive) heart failure: Secondary | ICD-10-CM

## 2023-10-19 DIAGNOSIS — G720 Drug-induced myopathy: Secondary | ICD-10-CM | POA: Diagnosis not present

## 2023-10-19 DIAGNOSIS — Z79899 Other long term (current) drug therapy: Secondary | ICD-10-CM | POA: Diagnosis not present

## 2023-10-19 DIAGNOSIS — E785 Hyperlipidemia, unspecified: Secondary | ICD-10-CM

## 2023-10-19 DIAGNOSIS — T466X5D Adverse effect of antihyperlipidemic and antiarteriosclerotic drugs, subsequent encounter: Secondary | ICD-10-CM | POA: Diagnosis not present

## 2023-10-19 DIAGNOSIS — I4891 Unspecified atrial fibrillation: Secondary | ICD-10-CM

## 2023-10-19 DIAGNOSIS — I9789 Other postprocedural complications and disorders of the circulatory system, not elsewhere classified: Secondary | ICD-10-CM

## 2023-10-19 DIAGNOSIS — I493 Ventricular premature depolarization: Secondary | ICD-10-CM | POA: Diagnosis not present

## 2023-10-19 MED ORDER — CARVEDILOL 6.25 MG PO TABS: 6.2500 mg | ORAL_TABLET | Freq: Two times a day (BID) | ORAL | 3 refills | Status: DC

## 2023-10-19 NOTE — Progress Notes (Signed)
 Cardiology Office Note:  .   Date:  10/19/2023  ID:  Lynwood JONELLE Law, DOB [birth year], MRN [id number] PCP: Justus Leita DEL, MD   HeartCare Providers Cardiologist:  Lonni Hanson, MD Electrophysiologist:  OLE ONEIDA HOLTS, MD     History of Present Illness: .   OSHA ERRICO is a 73 y.o. 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  He was last seen by general cardiology in 12/2022, at which time he noted improved fatigue.  He has subsequently seen EP several times due to phrenic nerve stimulation from his biventricular pacemaker.  At his last visit in May, he denied further phrenic nerve stimulation.  He was otherwise asymptomatic.  Today, Mr. Deboard reports he has been feeling fairly well, though his wife notes that Mr. Jacob has been somewhat tired at times.  He continues to exercise intermittently and also play golf a few days a week.  He denies chest pain, shortness of breath, palpitations, lightheadedness, and edema.  He has not had any further phrenic nerve stimulation.  ROS: See HPI  Studies Reviewed: SABRA   EKG Interpretation Date/Time:  Thursday October 19 2023 08:06:40 EDT Ventricular Rate:  74 PR Interval:  166 QRS Duration:  148 QT Interval:  454 QTC Calculation: 503 R Axis:   178  Text Interpretation: AV dual-paced rhythm Biventricular pacemaker detected Abnormal ECG When compared with ECG of 27-Apr-2023 11:27, Premature ventricular  complexes are no longer Present Confirmed by Tela Kotecki, Lonni [phone]) on 10/19/2023 8:12:56 AM    Limited TTE (06/27/2023): Normal LV size and wall thickness.  LVEF 40-45% with inferior wall hypokinesis and grade 1 diastolic dysfunction.  Normal RV size and function.  Normal biatrial size.  No pericardial effusion.  Followed mitral regurgitation.  Aortic sclerosis without stenosis.  Normal CVP.  Risk Assessment/Calculations:    CHA2DS2-VASc Score = 4   This indicates a 4.8% annual risk of stroke. The patient's score is based upon: CHF History: 1 HTN History: 1 Diabetes History: 0 Stroke History: 0 Vascular Disease History: 1 Age Score: 1 Gender Score: 0            Physical Exam:   VS:  BP (!) 92/52 (BP Location: Right Arm, Patient Position: Sitting, Cuff Size: Normal)   Pulse 74   Ht 5' 6 (1.676 m)   Wt 179 lb 3.2 oz (81.3 kg)   BMI 28.92 kg/m    Wt Readings from Last 3 Encounters:  10/19/23 179 lb 3.2 oz (81.3 kg)  06/29/23 175 lb 3.2 oz (79.5 kg)  06/06/23 181 lb 8 oz (82.3 kg)    General:  NAD. Neck: No JVD or HJR. Lungs: Clear to auscultation bilaterally without wheezes or crackles. Heart: Regular rate and rhythm without murmurs, rubs, or gallops. Abdomen: Soft, nontender, nondistended. Extremities: No lower extremity edema.  ASSESSMENT AND PLAN: .    Coronary artery disease: No angina reported following most recent intervention to the RCA in 11/2022.  We will complete 12 months of DAPT, discontinuing aspirin  and continuing with clopidogrel  monotherapy  beginning 12/30/2023.  Continue secondary prevention with evolocumab and lieu of statin therapy given history of statin intolerance.  Chronic HFrEF: Mr. Maners appears euvolemic with NYHA class II symptoms.  Given some fatigue and soft blood pressure again today, we agreed to reduce carvedilol  to 6.25 mg twice daily.  Continue current doses of Entresto  and spironolactone .  Check BMP today to ensure stable renal function and  potassium.  Continue follow-up of CRT-D per EP (fortunately, no further diaphragmatic stimulation).  Hyperlipidemia and statin myopathy: Lipids well-controlled on last check in February.  Continue evolocumab in place of statin therapy given statin intolerance.  PVCs: EKG without ectopy today.  No palpitations reported.  Continue mexiletine with ongoing EP follow-up.  Postoperative atrial fibrillation: No evidence of recurrence.  Defer anticoagulation.    Dispo: Return to clinic in 6 months.  Signed, Lonni Hanson, MD

## 2023-10-19 NOTE — Patient Instructions (Signed)
 Medication Instructions:  Your physician recommends the following medication changes.  DECREASE: Carvedilol  to 6.25 mg by mouth twice daily   STOP TAKING: Aspirin  81 mg on December 30, 2023   *If you need a refill on your cardiac medications before your next appointment, please call your pharmacy*  Lab Work: Your provider would like for you to have following labs drawn today BMP.     Testing/Procedures: No test ordered today   Follow-Up: At [facility], you and your health needs are our priority.  As part of our continuing mission to provide you with exceptional heart care, our providers are all part of one team.  This team includes your primary Cardiologist (physician) and Advanced Practice Providers or APPs (Physician Assistants and Nurse Practitioners) who all work together to provide you with the care you need, when you need it.  Your next appointment:   6 month(s)  Provider:   You may see Lonni Hanson, MD or one of the following Advanced Practice Providers on your designated Care Team:   Lonni Meager, NP Lesley Maffucci, PA-C Bernardino Bring, PA-C Cadence [place], PA-C Tylene Lunch, NP Barnie Hila, NP

## 2023-10-20 ENCOUNTER — Ambulatory Visit: Payer: Self-pay | Admitting: Internal Medicine

## 2023-10-20 LAB — BASIC METABOLIC PANEL WITH GFR
BUN/Creatinine Ratio: 26 — ABNORMAL HIGH (ref 10–24)
BUN: 29 mg/dL — ABNORMAL HIGH (ref 8–27)
CO2: 19 mmol/L — ABNORMAL LOW (ref 20–29)
Calcium: 9.4 mg/dL (ref 8.6–10.2)
Chloride: 101 mmol/L (ref 96–106)
Creatinine, Ser: 1.13 mg/dL (ref 0.76–1.27)
Glucose: 142 mg/dL — ABNORMAL HIGH (ref 70–99)
Potassium: 5.1 mmol/L (ref 3.5–5.2)
Sodium: 137 mmol/L (ref 134–144)
eGFR: 69 mL/min/1.73 (ref >59)

## 2023-12-01 DIAGNOSIS — M76892 Other specified enthesopathies of left lower limb, excluding foot: Secondary | ICD-10-CM | POA: Diagnosis not present

## 2023-12-01 DIAGNOSIS — M160 Bilateral primary osteoarthritis of hip: Secondary | ICD-10-CM | POA: Diagnosis not present

## 2023-12-01 NOTE — Progress Notes (Signed)
 Remote ICD Transmission

## 2023-12-14 ENCOUNTER — Ambulatory Visit (INDEPENDENT_AMBULATORY_CARE_PROVIDER_SITE_OTHER): Payer: PPO | Admitting: Emergency Medicine

## 2023-12-14 NOTE — Progress Notes (Signed)
   Subjective:   A user error has taken place: encounter opened in error, closed for administrative reasons.  Patient cancelled appointment. Does not want to do Medicare AWV.

## 2023-12-19 ENCOUNTER — Ambulatory Visit: Attending: Cardiology

## 2023-12-19 DIAGNOSIS — I447 Left bundle-branch block, unspecified: Secondary | ICD-10-CM | POA: Diagnosis not present

## 2023-12-20 LAB — CUP PACEART REMOTE DEVICE CHECK
Battery Remaining Longevity: 24 mo
Battery Remaining Percentage: 37 %
Battery Voltage: 2.93 V
Brady Statistic AP VP Percent: 90 %
Brady Statistic AP VS Percent: 1.8 %
Brady Statistic AS VP Percent: 5.3 %
Brady Statistic AS VS Percent: 1 %
Brady Statistic RA Percent Paced: 88 %
Date Time Interrogation Session: 20251021055302
HighPow Impedance: 66 Ohm
Implantable Lead Connection Status: 753985
Implantable Lead Connection Status: 753985
Implantable Lead Connection Status: 753985
Implantable Lead Implant Date: 20220124
Implantable Lead Implant Date: 20220124
Implantable Lead Implant Date: 20220124
Implantable Lead Location: 753858
Implantable Lead Location: 753859
Implantable Lead Location: 753860
Implantable Pulse Generator Implant Date: 20220124
Lead Channel Impedance Value: 450 Ohm
Lead Channel Impedance Value: 460 Ohm
Lead Channel Impedance Value: 850 Ohm
Lead Channel Pacing Threshold Amplitude: 0.5 V
Lead Channel Pacing Threshold Amplitude: 0.75 V
Lead Channel Pacing Threshold Amplitude: 2.5 V
Lead Channel Pacing Threshold Pulse Width: 0.5 ms
Lead Channel Pacing Threshold Pulse Width: 0.5 ms
Lead Channel Pacing Threshold Pulse Width: 0.5 ms
Lead Channel Sensing Intrinsic Amplitude: 1.7 mV
Lead Channel Sensing Intrinsic Amplitude: 12 mV
Lead Channel Setting Pacing Amplitude: 1.75 V
Lead Channel Setting Pacing Amplitude: 2 V
Lead Channel Setting Pacing Amplitude: 3 V
Lead Channel Setting Pacing Pulse Width: 0.5 ms
Lead Channel Setting Pacing Pulse Width: 0.5 ms
Lead Channel Setting Sensing Sensitivity: 0.5 mV
Pulse Gen Serial Number: 810017138
Zone Setting Status: 755011

## 2023-12-22 NOTE — Progress Notes (Signed)
 Remote ICD Transmission

## 2023-12-25 ENCOUNTER — Ambulatory Visit: Payer: Self-pay | Admitting: Cardiology

## 2023-12-26 ENCOUNTER — Other Ambulatory Visit: Payer: Self-pay

## 2023-12-26 DIAGNOSIS — I6523 Occlusion and stenosis of bilateral carotid arteries: Secondary | ICD-10-CM

## 2023-12-26 DIAGNOSIS — I739 Peripheral vascular disease, unspecified: Secondary | ICD-10-CM

## 2024-01-24 ENCOUNTER — Ambulatory Visit: Admitting: Vascular Surgery

## 2024-01-24 ENCOUNTER — Ambulatory Visit (HOSPITAL_COMMUNITY)

## 2024-01-31 ENCOUNTER — Ambulatory Visit (HOSPITAL_BASED_OUTPATIENT_CLINIC_OR_DEPARTMENT_OTHER)
Admission: RE | Admit: 2024-01-31 | Discharge: 2024-01-31 | Disposition: A | Source: Ambulatory Visit | Attending: Vascular Surgery

## 2024-01-31 ENCOUNTER — Ambulatory Visit: Admitting: Vascular Surgery

## 2024-01-31 ENCOUNTER — Encounter: Payer: Self-pay | Admitting: Vascular Surgery

## 2024-01-31 ENCOUNTER — Ambulatory Visit (HOSPITAL_COMMUNITY): Admission: RE | Admit: 2024-01-31 | Discharge: 2024-01-31 | Attending: Vascular Surgery

## 2024-01-31 VITALS — BP 135/94 | HR 73 | Temp 98.0°F | Ht 66.0 in | Wt 182.0 lb

## 2024-01-31 DIAGNOSIS — I739 Peripheral vascular disease, unspecified: Secondary | ICD-10-CM

## 2024-01-31 DIAGNOSIS — I6523 Occlusion and stenosis of bilateral carotid arteries: Secondary | ICD-10-CM

## 2024-01-31 DIAGNOSIS — Z9889 Other specified postprocedural states: Secondary | ICD-10-CM | POA: Diagnosis not present

## 2024-01-31 LAB — VAS US ABI WITH/WO TBI
Left ABI: 1.13
Right ABI: 0.72

## 2024-01-31 NOTE — Progress Notes (Signed)
 Patient ID: [name], male   DOB: 1950-07-21, 73 y.o.   MRN: [id number]  Reason for Consult: Follow-up   Referred by Justus Leita DEL, MD  Subjective:     HPI:  [name] is a 73 y.o. male history of left popliteal and TP trunk endarterectomy with bypass from the left SFA to the TP trunk.  This was done for rest pain.  At this time he occasionally has some numbness in his bilateral lower extremities but denies any claudication.  He remains on Plavix  recently aspirin  was discontinued by his cardiologist.  He continues to play golf and recently shot -2.  He does have limitations with walking this appears to be multifactorial.  Past Medical History:  Diagnosis Date   Abnormal nuclear cardiac imaging test 08/08/2015   AICD (automatic cardioverter/defibrillator) present    pacemaker/defib [employer]   Arthritis    fingers   Carotid artery occlusion    CHF (congestive heart failure) (HCC)    Coronary atherosclerosis of native coronary artery 01/29/2013   11/05/19 R/LHC 80% dLMCA stenosis small diffusely dz dLAD, chronically occluded OM1 50%mRCA lesion, widely patent mLCx strent, moderately elevated L heart filling pressures, mild to moderate RH filling pressures, normal to moderately reduced CO   Duodenal erosion    Encounter for screening for lung cancer 07/13/2016   Esophageal stenosis    esophageal dilation   GERD (gastroesophageal reflux disease)    H. 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 Cardiac catheterization-nonobstructive 60% RCA disease-subsequent nuclear stress test-9 minutes, low risk, mild inferior wall hypokinesis    Pain in limb 12/19/2017   Peripheral vascular disease    Unstable angina (HCC) 11/25/2017   Family History  Problem Relation Age of Onset   Heart  attack Mother    Coronary artery disease Mother    Heart disease Mother        Carotid Stenosis and BPG and Heart Disease before age 28   Diabetes Mother    Hypertension Mother    Heart attack Father    Heart disease Father        BPG and Heart Disease before age 53   Hypertension Father    Cancer Father 63       throat   Stroke Father    Colon cancer Neg Hx    Colon polyps Neg Hx    Esophageal cancer Neg Hx    Rectal cancer Neg Hx    Stomach cancer Neg Hx    Past Surgical History:  Procedure Laterality Date   ABDOMINAL AORTOGRAM W/LOWER EXTREMITY Left 01/24/2022   Procedure: ABDOMINAL AORTOGRAM W/LOWER EXTREMITY;  Surgeon: Sheree Penne Bruckner, MD;  Location: [facility] INVASIVE CV LAB;  Service: Cardiovascular;  Laterality: Left;   APPENDECTOMY     BACK SURGERY     BIV ICD INSERTION CRT-D N/A 03/23/2020   Procedure: BIV ICD INSERTION CRT-D;  Surgeon: Cindie Ole DASEN, MD;  Location: [facility] INVASIVE CV LAB;  Service: Cardiovascular;  Laterality: N/A;   CARDIAC CATHETERIZATION N/A 08/07/2015   Procedure: Left Heart Cath and Coronary Angiography;  Surgeon: Oneil JAYSON Parchment, MD;  Location: [facility] INVASIVE CV LAB;  Service: Cardiovascular;  Laterality: N/A;   CARDIAC CATHETERIZATION N/A 08/07/2015   Procedure: Coronary Stent Intervention;  Surgeon: Oneil JAYSON Parchment, MD;  Location: [facility] INVASIVE CV LAB;  Service: Cardiovascular;  Laterality: N/A;   CARDIAC CATHETERIZATION N/A 08/07/2015   Procedure: Coronary Stent Intervention;  Surgeon: Peter M Jordan, MD;  Location: [facility] INVASIVE CV LAB;  Service: Cardiovascular;  Laterality: N/A;   CAROTID ENDARTERECTOMY  01/05/2006   Right  CEA with DPA   CATARACT EXTRACTION W/ INTRAOCULAR LENS IMPLANT Left 12/04/2017   CATARACT EXTRACTION W/PHACO Left 12/04/2017   Procedure: CATARACT EXTRACTION PHACO AND INTRAOCULAR LENS PLACEMENT (IOC) LEFT;  Surgeon: Myrna Adine Oneil, MD;  Location: [facility] SURGERY CNTR;  Service: Ophthalmology;  Laterality: Left;   CATARACT  EXTRACTION W/PHACO Right 02/06/2018   Procedure: CATARACT EXTRACTION PHACO AND INTRAOCULAR LENS PLACEMENT (IOC)RIGHT;  Surgeon: Myrna Adine Oneil, MD;  Location: [facility] SURGERY CNTR;  Service: Ophthalmology;  Laterality: Right;   COLONOSCOPY  05/20/2008   COLONOSCOPY WITH PROPOFOL  N/A 09/13/2018   Procedure: COLONOSCOPY WITH BIOPSY;  Surgeon: Jinny Carmine, MD;  Location: [facility] SURGERY CNTR;  Service: Endoscopy;  Laterality: N/A;   CORONARY ARTERY BYPASS GRAFT N/A 11/11/2019   Procedure: CORONARY ARTERY BYPASS GRAFTING (CABG) USING LIMA to Diag1; ENDOSCOPICALLY HARVESTED RIGHT GREATER SAPHENOUS VEIN: SVG to OM1; SVG to OM2; SVG to PDA.;  Surgeon: Fleeta Hanford Coy, MD;  Location: [facility] OR;  Service: Open Heart Surgery;  Laterality: N/A;   CORONARY STENT INTERVENTION N/A 12/27/2022   Procedure: CORONARY STENT INTERVENTION;  Surgeon: Mady Bruckner, MD;  Location: [facility] INVASIVE CV LAB;  Service: Cardiovascular;  Laterality: N/A;   CORONARY STENT PLACEMENT  08/07/2015   MID CIRCUMFLEX   ENDARTERECTOMY FEMORAL Bilateral 09/30/2020   Procedure: ENDARTERECTOMY FEMORAL;  Surgeon: Marea Selinda RAMAN, MD;  Location: [facility] ORS;  Service: Vascular;  Laterality: Bilateral;   ENDARTERECTOMY POPLITEAL Left 01/25/2022   Procedure: ENDARTERECTOMY POPLITEAL;  Surgeon: Sheree Penne Bruckner, MD;  Location: [facility] OR;  Service: Vascular;  Laterality: Left;   ENDOVEIN HARVEST OF GREATER SAPHENOUS VEIN Right 11/11/2019   Procedure: ENDOVEIN HARVEST OF GREATER SAPHENOUS VEIN;  Surgeon: Fleeta Hanford Coy, MD;  Location: [facility] OR;  Service: Open Heart Surgery;  Laterality: Right;   ESOPHAGOGASTRODUODENOSCOPY (EGD) WITH PROPOFOL  N/A 02/11/2019   Procedure: ESOPHAGOGASTRODUODENOSCOPY (EGD) WITH BIOPSY and  Dilation;  Surgeon: Jinny Carmine, MD;  Location: [facility] SURGERY CNTR;  Service: Endoscopy;  Laterality: N/A;   ESOPHAGOGASTRODUODENOSCOPY (EGD) WITH PROPOFOL  N/A 12/23/2021   Procedure: ESOPHAGOGASTRODUODENOSCOPY (EGD) WITH PROPOFOL ;   Surgeon: Jinny Carmine, MD;  Location: [facility] ENDOSCOPY;  Service: Endoscopy;  Laterality: N/A;   FEMORAL-FEMORAL BYPASS GRAFT Left 01/25/2022   Procedure: left Femoral to below the knee Popliteal bypass.;  Surgeon: Sheree Penne Bruckner, MD;  Location: [facility] OR;  Service: Vascular;  Laterality: Left;   HIP SURGERY Left 10/2016   left hip tendon repair   LEFT HEART CATH AND CORONARY ANGIOGRAPHY N/A 11/27/2017   Procedure: LEFT HEART CATH AND CORONARY ANGIOGRAPHY;  Surgeon: Darron Deatrice LABOR, MD;  Location: [facility] INVASIVE CV LAB;  Service: Cardiovascular;  Laterality: N/A;   LOWER EXTREMITY ANGIOGRAPHY Left 02/12/2018   Procedure: LOWER EXTREMITY ANGIOGRAPHY;  Surgeon: Marea Selinda RAMAN, MD;  Location: [facility] INVASIVE CV LAB;  Service: Cardiovascular;  Laterality: Left;   LOWER EXTREMITY ANGIOGRAPHY Left 03/07/2018   Procedure: LOWER EXTREMITY ANGIOGRAPHY;  Surgeon: Marea Selinda RAMAN, MD;  Location: [facility] INVASIVE CV LAB;  Service: Cardiovascular;  Laterality: Left;   LOWER EXTREMITY ANGIOGRAPHY Left 06/04/2018   Procedure: LOWER EXTREMITY ANGIOGRAPHY;  Surgeon: Marea Selinda RAMAN, MD;  Location: [facility] INVASIVE CV LAB;  Service: Cardiovascular;  Laterality: Left;   LOWER EXTREMITY ANGIOGRAPHY Left 09/17/2020   Procedure: LOWER EXTREMITY ANGIOGRAPHY;  Surgeon: Marea Selinda RAMAN, MD;  Location: [facility] INVASIVE CV LAB;  Service: Cardiovascular;  Laterality: Left;   LOWER EXTREMITY ANGIOGRAPHY Left 09/28/2020   Procedure: LOWER EXTREMITY ANGIOGRAPHY;  Surgeon: Marea Selinda RAMAN, MD;  Location: [facility] INVASIVE CV LAB;  Service: Cardiovascular;  Laterality: Left;   LOWER EXTREMITY ANGIOGRAPHY N/A 02/15/2021   Procedure: LOWER EXTREMITY ANGIOGRAPHY;  Surgeon: Marea Selinda RAMAN, MD;  Location: [facility] INVASIVE CV LAB;  Service: Cardiovascular;  Laterality: N/A;   LOWER EXTREMITY ANGIOGRAPHY Left 12/27/2021   Procedure: Lower Extremity Angiography;  Surgeon: Marea Selinda RAMAN, MD;  Location: [facility] INVASIVE CV LAB;  Service: Cardiovascular;  Laterality: Left;    LOWER EXTREMITY ANGIOGRAPHY Left 12/28/2021   Procedure: Lower Extremity Angiography;  Surgeon: Marea Selinda RAMAN, MD;  Location: [facility] INVASIVE CV LAB;  Service: Cardiovascular;  Laterality: Left;   PLACEMENT OF IMPELLA LEFT VENTRICULAR ASSIST DEVICE N/A 11/11/2019   Procedure: PLACEMENT OF IMPELLA LEFT VENTRICULAR ASSIST DEVICE 5.5;  Surgeon: Fleeta Hanford Coy, MD;  Location: [facility] OR;  Service: Open Heart Surgery;  Laterality: N/A;  Midline Sternotomy   POLYPECTOMY N/A 09/13/2018   Procedure: POLYPECTOMY;  Surgeon: Jinny Carmine, MD;  Location: [facility] SURGERY CNTR;  Service: Endoscopy;  Laterality: N/A;   POLYPECTOMY N/A 02/11/2019   Procedure: POLYPECTOMY;  Surgeon: Jinny Carmine, MD;  Location: [facility] SURGERY CNTR;  Service: Endoscopy;  Laterality: N/A;   REMOVAL OF IMPELLA LEFT VENTRICULAR ASSIST DEVICE N/A 11/15/2019   Procedure: REMOVAL OF IMPELLA 5.5 LEFT VENTRICULAR ASSIST DEVICE;  Surgeon: Fleeta Hanford Coy, MD;  Location: [facility] OR;  Service: Open Heart Surgery;  Laterality: N/A;   RIGHT/LEFT HEART CATH AND CORONARY ANGIOGRAPHY N/A 11/05/2019   Procedure: RIGHT/LEFT HEART CATH AND CORONARY ANGIOGRAPHY;  Surgeon: Mady Bruckner, MD;  Location: [facility] INVASIVE CV LAB;  Service: Cardiovascular;  Laterality: N/A;   RIGHT/LEFT HEART CATH AND CORONARY/GRAFT ANGIOGRAPHY Bilateral 12/27/2022   Procedure: RIGHT/LEFT HEART CATH AND CORONARY/GRAFT ANGIOGRAPHY;  Surgeon: Mady Bruckner, MD;  Location: [facility] INVASIVE CV LAB;  Service: Cardiovascular;  Laterality: Bilateral;   SPINE SURGERY     TEE WITHOUT CARDIOVERSION N/A 11/11/2019   Procedure: TRANSESOPHAGEAL ECHOCARDIOGRAM (TEE);  Surgeon: Fleeta Hanford, Coy, MD;  Location: [facility] OR;  Service: Open Heart Surgery;  Laterality: N/A;   TEE WITHOUT CARDIOVERSION N/A 11/15/2019   Procedure: TRANSESOPHAGEAL ECHOCARDIOGRAM (TEE);  Surgeon: Fleeta Hanford, Coy, MD;  Location: [facility] OR;  Service: Open Heart Surgery;  Laterality: N/A;   TONSILLECTOMY      Short Social History:   Social History   Tobacco Use   Smoking status: Former    Current packs/day: 0.00    Average packs/day: 1.3 packs/day for 36.3 years (45.4 ttl pk-yrs)    Types: Cigarettes    Start date: 02/28/1970    Quit date: 02/28/2005    Years since quitting: 18.9    Passive exposure: Never   Smokeless tobacco: Current    Types: Snuff   Tobacco comments:    occaisionally  Substance Use Topics   Alcohol use: Yes    Alcohol/week: 10.0 standard drinks of alcohol    Types: 10 Standard drinks or equivalent per week    Comment: wine/liquor weekly    Allergies  Allergen Reactions   Brilinta  [Ticagrelor ] Shortness Of Breath   Chlorhexidine  Gluconate Other (See Comments)    Skin burning for hours afterward   Contrast Media [Iodinated Contrast Media] Itching    Face and head  flushing, nose itching after contrast administation for angiogram   Statins Other (See Comments)    Failed Crestor  5 mg twice weekly, Crestor  20 mg daily, Pravastatin 40 mg qd, Lipitor, Zocor - muscle aches   Zetia [Ezetimibe] Other (See Comments)    Muscle aches    Current Outpatient Medications  Medication Sig Dispense Refill   aspirin  EC 81 MG tablet Take 1 tablet (81 mg total) by mouth daily. 150 tablet 2   carvedilol  (COREG ) 6.25 MG tablet Take 1 tablet (6.25 mg total) by mouth 2 (two) times daily. 180 tablet 3   clobetasol (TEMOVATE) 0.05 % external solution Apply 1 Application topically daily.     clopidogrel  (PLAVIX ) 75 MG tablet TAKE 1 TABLET BY MOUTH DAILY WITH BREAKFAST. 90 tablet 3   colchicine  0.6 MG tablet Take 1 tablet (0.6 mg total) by mouth 2 (two) times daily as needed (gout attacks). 180 tablet 1   desoximetasone (TOPICORT) 0.25 % cream Apply 1 Application topically 2 (two) times daily.     Evolocumab (REPATHA SURECLICK) 140 MG/ML SOAJ Inject 140 mg into the skin every 14 (fourteen) days. 6 mL 3   hydrOXYzine (ATARAX) 25 MG tablet Take 25 mg by mouth at bedtime.     levocetirizine (XYZAL) 5 MG tablet Take 5  mg by mouth daily.     methocarbamol (ROBAXIN) 500 MG tablet Take 500 mg by mouth. 1 po q occasionally as needed for pain/spasm. No more than one every few days.     mexiletine (MEXITIL ) 150 MG capsule TAKE 1 CAPSULE BY MOUTH TWICE A DAY 180 capsule 3   nitroGLYCERIN  (NITROSTAT ) 0.4 MG SL tablet Place 1 tablet (0.4 mg total) under the tongue every 5 (five) minutes as needed for chest pain. 25 tablet 2   oxyCODONE -acetaminophen  (PERCOCET) 5-325 MG tablet Take 1 tablet by mouth every 8 (eight) hours as needed. 20 tablet 0   sacubitril -valsartan  (ENTRESTO ) 49-51 MG Take 1 tablet by mouth 2 (two) times daily. 180 tablet 3   spironolactone  (ALDACTONE ) 25 MG tablet Take 0.5 tablets (12.5 mg total) by mouth daily. 45 tablet 3   traMADol  (ULTRAM ) 50 MG tablet Take by mouth.     traZODone  (DESYREL ) 50 MG tablet TAKE 1 TABLET (50 MG TOTAL) BY MOUTH AT BEDTIME AS NEEDED. FOR SLEEP 90 tablet 1   No current facility-administered medications for this visit.    Review of Systems  Constitutional:  Constitutional negative. HENT: HENT negative.  Eyes: Eyes negative.  Cardiovascular: Cardiovascular negative.  GI: Gastrointestinal negative.  Musculoskeletal: Positive for joint pain.  Neurological: Neurological negative. Hematologic: Hematologic/lymphatic negative.  Psychiatric: Psychiatric negative.        Objective:  Objective   Vitals:   01/31/24 1105 01/31/24 1108  BP: (!) 136/92 (!) 135/94  Pulse: 73   Temp: 98 F (36.7 C)   SpO2: 95%   Weight: 182 lb (82.6 kg)   Height: 5' 6 (1.676 m)    Body mass index is 29.38 kg/m.  Physical Exam HENT:     Nose: Nose normal.  Eyes:     Pupils: Pupils are equal, round, and reactive to light.  Cardiovascular:     Pulses:          Popliteal pulses are 0 on the right side and 2+ on the left side.       Dorsalis pedis pulses are 0 on the right side and 0 on the left side.       Posterior tibial pulses are 0  on the right side and 0 on the left side.   Pulmonary:     Effort: Pulmonary effort is normal.  Abdominal:     General: Abdomen is flat.     Tenderness: There is no abdominal tenderness.  Musculoskeletal:     Right lower leg: No edema.     Left lower leg: No edema.  Skin:    General: Skin is warm.     Capillary Refill: Capillary refill takes 2 to 3 seconds.  Neurological:     General: No focal deficit present.     Mental Status: He is alert.  Psychiatric:        Mood and Affect: Mood normal.     Data: ABI Findings:  +---------+------------------+-----+----------+--------+  Right   Rt Pressure (mmHg)IndexWaveform  Comment   +---------+------------------+-----+----------+--------+  Brachial 129                                        +---------+------------------+-----+----------+--------+  ATA     93                0.72 monophasic          +---------+------------------+-----+----------+--------+  PTA     93                0.72 monophasic          +---------+------------------+-----+----------+--------+  Great Toe50                0.38                     +---------+------------------+-----+----------+--------+   +---------+------------------+-----+----------+-------+  Left    Lt Pressure (mmHg)IndexWaveform  Comment  +---------+------------------+-----+----------+-------+  Brachial 130                                       +---------+------------------+-----+----------+-------+  ATA     121               0.93 monophasic         +---------+------------------+-----+----------+-------+  PTA     147               1.13 monophasic         +---------+------------------+-----+----------+-------+  Great Toe0                 0.00                    +---------+------------------+-----+----------+-------+   +-------+-----------+-----------+------------+------------+  ABI/TBIToday's ABIToday's TBIPrevious ABIPrevious TBI   +-------+-----------+-----------+------------+------------+  Right 0.72       0.38       1.02        0.58          +-------+-----------+-----------+------------+------------+  Left  1.13       0          1.1         0.28          +-------+-----------+-----------+------------+------------+         Arterial wall calcification precludes accurate ankle pressures and ABIs.  Previous ABI on 11/30/22.    Summary:  Right: Resting right ankle-brachial index indicates moderate right lower  extremity arterial disease. The right toe-brachial index is abnormal.    Left: Resting left ankle-brachial index is within normal range. The left  toe-brachial  index is abnormal.     LEFT       PSV cm/sRatioStenosisWaveform  Comments  +-----------+--------+-----+--------+----------+--------+  SFA Prox   31                                       +-----------+--------+-----+--------+----------+--------+  SFA Mid    50                                       +-----------+--------+-----+--------+----------+--------+  ATA Distal 40                   monophasic          +-----------+--------+-----+--------+----------+--------+  PTA Distal 41                   monophasic          +-----------+--------+-----+--------+----------+--------+  PERO Distal72                   monophasic          +-----------+--------+-----+--------+----------+--------+     Left Graft #1: mid SFA to BK pop  +--------------------+--------+--------+----------+--------+                     PSV cm/sStenosisWaveform  Comments  +--------------------+--------+--------+----------+--------+  Inflow             64              biphasic            +--------------------+--------+--------+----------+--------+  Proximal Anastomosis54              monophasic          +--------------------+--------+--------+----------+--------+  Proximal Graft      53              biphasic             +--------------------+--------+--------+----------+--------+  Mid Graft           44              triphasic           +--------------------+--------+--------+----------+--------+  Distal Graft        42              triphasic           +--------------------+--------+--------+----------+--------+  Distal Anastomosis  42              monophasic          +--------------------+--------+--------+----------+--------+  Outflow            42              monophasic          +--------------------+--------+--------+----------+--------+     Summary:  Left: Patent graft witn no stenosis. Velocities at 40 cm/s.         Assessment/Plan:    73 year old male with history of left lower extremity bypass for rest pain that now appears patent with stable velocities over the past 18 months.  They are somewhat decreased and monophasic distally and I am unsure how to reconcile this as he does not have palpable pulses and his toe pressure is 0 but he remains asymptomatic.  Ultimately he may benefit from angiography from a right common femoral approach but the bypass appears widely patent at  this time.  His carotid duplexes are stable and this will follow-up in 1 year.  From the left lower extremity I will follow him up in 6 months unless he has issues prior to that.  He will continue Plavix  as aspirin  was recently discontinued.  All questions were answered he demonstrates good understanding.     Penne Lonni Colorado MD [facility]

## 2024-02-01 ENCOUNTER — Other Ambulatory Visit: Payer: Self-pay | Admitting: *Deleted

## 2024-02-01 DIAGNOSIS — I739 Peripheral vascular disease, unspecified: Secondary | ICD-10-CM

## 2024-02-26 ENCOUNTER — Telehealth: Payer: Self-pay

## 2024-02-26 NOTE — Telephone Encounter (Signed)
 Triage: -pt called with c/o R leg calf going numb all the way to the foot.  He explains he is having to rest frequently and get off his legs. -he states it began a few months ago but when he saw Dr. Sheree in the office he thought it was more of the change in weather situation, not wanting to complain; but it has been worsening since then and the weekend was the worst yet.  -pt reports he wants to go ahead and have the doctor do the procedure. -reviewed MD note.  Sent request to surgery scheduling for review.

## 2024-02-27 ENCOUNTER — Telehealth: Payer: Self-pay

## 2024-02-27 ENCOUNTER — Other Ambulatory Visit: Payer: Self-pay

## 2024-02-27 DIAGNOSIS — Z9889 Other specified postprocedural states: Secondary | ICD-10-CM

## 2024-02-27 NOTE — Telephone Encounter (Signed)
 Attempted to call for surgery scheduling. LVM

## 2024-03-11 ENCOUNTER — Encounter (HOSPITAL_COMMUNITY)
Admission: RE | Disposition: A | Discharge status: Home / Self Care | Payer: Self-pay | Source: Home / Self Care | Attending: Vascular Surgery

## 2024-03-11 ENCOUNTER — Other Ambulatory Visit: Payer: Self-pay

## 2024-03-11 ENCOUNTER — Ambulatory Visit (HOSPITAL_COMMUNITY): Admitting: Anesthesiology

## 2024-03-11 ENCOUNTER — Ambulatory Visit (HOSPITAL_COMMUNITY)
Admission: RE | Admit: 2024-03-11 | Discharge: 2024-03-11 | Disposition: A | Attending: Vascular Surgery | Admitting: Vascular Surgery

## 2024-03-11 DIAGNOSIS — T82898A Other specified complication of vascular prosthetic devices, implants and grafts, initial encounter: Secondary | ICD-10-CM | POA: Diagnosis not present

## 2024-03-11 DIAGNOSIS — Z7902 Long term (current) use of antithrombotics/antiplatelets: Secondary | ICD-10-CM | POA: Insufficient documentation

## 2024-03-11 DIAGNOSIS — Z7982 Long term (current) use of aspirin: Secondary | ICD-10-CM | POA: Insufficient documentation

## 2024-03-11 DIAGNOSIS — I739 Peripheral vascular disease, unspecified: Secondary | ICD-10-CM

## 2024-03-11 DIAGNOSIS — I70222 Atherosclerosis of native arteries of extremities with rest pain, left leg: Secondary | ICD-10-CM | POA: Diagnosis present

## 2024-03-11 DIAGNOSIS — I70212 Atherosclerosis of native arteries of extremities with intermittent claudication, left leg: Secondary | ICD-10-CM | POA: Diagnosis not present

## 2024-03-11 DIAGNOSIS — Z79899 Other long term (current) drug therapy: Secondary | ICD-10-CM | POA: Diagnosis not present

## 2024-03-11 DIAGNOSIS — Z95828 Presence of other vascular implants and grafts: Secondary | ICD-10-CM

## 2024-03-11 DIAGNOSIS — F17220 Nicotine dependence, chewing tobacco, uncomplicated: Secondary | ICD-10-CM | POA: Insufficient documentation

## 2024-03-11 HISTORY — PX: LOWER EXTREMITY INTERVENTION: CATH118252

## 2024-03-11 HISTORY — PX: ABDOMINAL AORTOGRAM W/LOWER EXTREMITY: CATH118223

## 2024-03-11 LAB — POCT I-STAT, CHEM 8
BUN: 28 mg/dL — ABNORMAL HIGH (ref 8–23)
BUN: 12 mg/dL (ref 8–23)
Calcium, Ion: 1.25 mmol/L (ref 1.15–1.40)
Calcium, Ion: 1.17 mmol/L (ref 1.15–1.40)
Chloride: 105 mmol/L (ref 98–111)
Chloride: 98 mmol/L (ref 98–111)
Creatinine, Ser: 1 mg/dL (ref 0.61–1.24)
Creatinine, Ser: 0.6 mg/dL — ABNORMAL LOW (ref 0.61–1.24)
Glucose, Bld: 104 mg/dL — ABNORMAL HIGH (ref 70–99)
Glucose, Bld: 181 mg/dL — ABNORMAL HIGH (ref 70–99)
HCT: 41 % (ref 39.0–52.0)
HCT: 35 % — ABNORMAL LOW (ref 39.0–52.0)
Hemoglobin: 13.9 g/dL (ref 13.0–17.0)
Hemoglobin: 11.9 g/dL — ABNORMAL LOW (ref 13.0–17.0)
Potassium: 5.6 mmol/L — ABNORMAL HIGH (ref 3.5–5.1)
Potassium: 3.3 mmol/L — ABNORMAL LOW (ref 3.5–5.1)
Sodium: 141 mmol/L (ref 135–145)
Sodium: 144 mmol/L (ref 135–145)
TCO2: 24 mmol/L (ref 22–32)
TCO2: 32 mmol/L (ref 22–32)

## 2024-03-11 MED ORDER — MIDAZOLAM HCL 2 MG/2ML IJ SOLN
INTRAMUSCULAR | Status: AC
  Filled 2024-03-11: qty 2

## 2024-03-11 MED ORDER — LIDOCAINE HCL (PF) 1 % IJ SOLN
INTRAMUSCULAR | Status: AC
  Filled 2024-03-11: qty 30

## 2024-03-11 MED ORDER — DIPHENHYDRAMINE HCL 50 MG/ML IJ SOLN
25.0000 mg | INTRAMUSCULAR | Status: AC
  Administered 2024-03-11: 25 mg via INTRAVENOUS
  Filled 2024-03-11: qty 1

## 2024-03-11 MED ORDER — LIDOCAINE HCL (PF) 1 % IJ SOLN
INTRAMUSCULAR | Status: DC | PRN
  Administered 2024-03-11: 15 mL

## 2024-03-11 MED ORDER — METHYLPREDNISOLONE SODIUM SUCC 125 MG IJ SOLR
125.0000 mg | INTRAMUSCULAR | Status: AC
  Administered 2024-03-11: 125 mg via INTRAVENOUS
  Filled 2024-03-11: qty 2

## 2024-03-11 MED ORDER — OXYCODONE HCL 5 MG PO TABS: 5.0000 mg | ORAL_TABLET | ORAL | Status: DC | PRN

## 2024-03-11 MED ORDER — SODIUM CHLORIDE 0.9% FLUSH: 3.0000 mL | Freq: Two times a day (BID) | INTRAVENOUS | Status: DC

## 2024-03-11 MED ORDER — LABETALOL HCL 5 MG/ML IV SOLN: 10.0000 mg | INTRAVENOUS | Status: DC | PRN

## 2024-03-11 MED ORDER — MIDAZOLAM HCL (PF) 2 MG/2ML IJ SOLN
INTRAMUSCULAR | Status: DC | PRN
  Administered 2024-03-11: 1 mg via INTRAVENOUS

## 2024-03-11 MED ORDER — SODIUM CHLORIDE 0.9% FLUSH: 3.0000 mL | INTRAVENOUS | Status: DC | PRN

## 2024-03-11 MED ORDER — HEPARIN (PORCINE) IN NACL 1000-0.9 UT/500ML-% IV SOLN
INTRAVENOUS | Status: DC | PRN
  Administered 2024-03-11 (×2): 500 mL

## 2024-03-11 MED ORDER — SODIUM CHLORIDE 0.9 % IV SOLN: 250.0000 mL | INTRAVENOUS | Status: DC | PRN

## 2024-03-11 MED ORDER — FENTANYL CITRATE (PF) 100 MCG/2ML IJ SOLN
INTRAMUSCULAR | Status: AC
  Filled 2024-03-11: qty 2

## 2024-03-11 MED ORDER — IODIXANOL 320 MG/ML IV SOLN
INTRAVENOUS | Status: DC | PRN
  Administered 2024-03-11: 75 mL

## 2024-03-11 MED ORDER — ACETAMINOPHEN 325 MG PO TABS
ORAL_TABLET | ORAL | Status: AC
  Filled 2024-03-11: qty 2

## 2024-03-11 MED ORDER — FENTANYL CITRATE (PF) 100 MCG/2ML IJ SOLN
INTRAMUSCULAR | Status: DC | PRN
  Administered 2024-03-11: 50 ug via INTRAVENOUS

## 2024-03-11 MED ORDER — SODIUM CHLORIDE 0.9 % IV SOLN: INTRAVENOUS | Status: DC

## 2024-03-11 MED ORDER — HYDRALAZINE HCL 20 MG/ML IJ SOLN: 5.0000 mg | INTRAMUSCULAR | Status: DC | PRN

## 2024-03-11 MED ORDER — SODIUM CHLORIDE 0.9 % WEIGHT BASED INFUSION: 1.0000 mL/kg/h | INTRAVENOUS | Status: DC

## 2024-03-11 MED ORDER — FAMOTIDINE IN NACL 20-0.9 MG/50ML-% IV SOLN: 20.0000 mg | INTRAVENOUS | Status: DC

## 2024-03-11 MED ORDER — ACETAMINOPHEN 325 MG PO TABS
650.0000 mg | ORAL_TABLET | Freq: Four times a day (QID) | ORAL | Status: DC | PRN
  Administered 2024-03-11: 650 mg via ORAL

## 2024-03-11 NOTE — H&P (Signed)
 "     HPI:   [name] is a 74 y.o. male history of left popliteal and TP trunk endarterectomy with bypass from the left SFA to the TP trunk.  This was done for rest pain.  At this time he occasionally has some numbness in his bilateral lower extremities but denies any claudication.  He remains on Plavix  recently aspirin  was discontinued by his cardiologist.  He continues to play golf and recently shot -2.  He does have limitations with walking this appears to be multifactorial.       Past Medical History:  Diagnosis Date   Abnormal nuclear cardiac imaging test 08/08/2015   AICD (automatic cardioverter/defibrillator) present      pacemaker/defib [employer]   Arthritis      fingers   Carotid artery occlusion     CHF (congestive heart failure) (HCC)     Coronary atherosclerosis of native coronary artery 01/29/2013    11/05/19 R/LHC 80% dLMCA stenosis small diffusely dz dLAD, chronically occluded OM1 50%mRCA lesion, widely patent mLCx strent, moderately elevated L heart filling pressures, mild to moderate RH filling pressures, normal to moderately reduced CO   Duodenal erosion     Encounter for screening for lung cancer 07/13/2016   Esophageal stenosis      esophageal dilation   GERD (gastroesophageal reflux disease)     H. pylori infection     Heart attack (HCC) 11/2007    Mild   Hiatal hernia     Hyperlipidemia     Hypertension     Ischemic leg 12/27/2021   Old myocardial infarction 11/29/2007    Mildly elevated troponin, isolated value in October 2009. Cardiac catheterization-nonobstructive 60% RCA disease-subsequent nuclear stress test-9 minutes, low risk, mild inferior wall hypokinesis    Pain in limb 12/19/2017   Peripheral vascular disease     Unstable angina (HCC) 11/25/2017             Family History  Problem Relation Age of Onset   Heart attack Mother     Coronary artery disease Mother     Heart disease Mother          Carotid Stenosis and BPG and Heart  Disease before age 59   Diabetes Mother     Hypertension Mother     Heart attack Father     Heart disease Father          BPG and Heart Disease before age 47   Hypertension Father     Cancer Father 47        throat   Stroke Father     Colon cancer Neg Hx     Colon polyps Neg Hx     Esophageal cancer Neg Hx     Rectal cancer Neg Hx     Stomach cancer Neg Hx               Past Surgical History:  Procedure Laterality Date   ABDOMINAL AORTOGRAM W/LOWER EXTREMITY Left 01/24/2022    Procedure: ABDOMINAL AORTOGRAM W/LOWER EXTREMITY;  Surgeon: Sheree Penne Bruckner, MD;  Location: [facility] INVASIVE CV LAB;  Service: Cardiovascular;  Laterality: Left;   APPENDECTOMY       BACK SURGERY       BIV ICD INSERTION CRT-D N/A 03/23/2020    Procedure: BIV ICD INSERTION CRT-D;  Surgeon: Cindie Ole DASEN, MD;  Location: [facility] INVASIVE CV LAB;  Service: Cardiovascular;  Laterality: N/A;   CARDIAC CATHETERIZATION N/A 08/07/2015    Procedure:  Left Heart Cath and Coronary Angiography;  Surgeon: Oneil JAYSON Parchment, MD;  Location: [facility] INVASIVE CV LAB;  Service: Cardiovascular;  Laterality: N/A;   CARDIAC CATHETERIZATION N/A 08/07/2015    Procedure: Coronary Stent Intervention;  Surgeon: Oneil JAYSON Parchment, MD;  Location: [facility] INVASIVE CV LAB;  Service: Cardiovascular;  Laterality: N/A;   CARDIAC CATHETERIZATION N/A 08/07/2015    Procedure: Coronary Stent Intervention;  Surgeon: Peter M Jordan, MD;  Location: [facility] INVASIVE CV LAB;  Service: Cardiovascular;  Laterality: N/A;   CAROTID ENDARTERECTOMY   01/05/2006    Right  CEA with DPA   CATARACT EXTRACTION W/ INTRAOCULAR LENS IMPLANT Left 12/04/2017   CATARACT EXTRACTION W/PHACO Left 12/04/2017    Procedure: CATARACT EXTRACTION PHACO AND INTRAOCULAR LENS PLACEMENT (IOC) LEFT;  Surgeon: Myrna Adine Oneil, MD;  Location: [facility] SURGERY CNTR;  Service: Ophthalmology;  Laterality: Left;   CATARACT EXTRACTION W/PHACO Right 02/06/2018    Procedure: CATARACT EXTRACTION PHACO AND  INTRAOCULAR LENS PLACEMENT (IOC)RIGHT;  Surgeon: Myrna Adine Oneil, MD;  Location: [facility] SURGERY CNTR;  Service: Ophthalmology;  Laterality: Right;   COLONOSCOPY   05/20/2008   COLONOSCOPY WITH PROPOFOL  N/A 09/13/2018    Procedure: COLONOSCOPY WITH BIOPSY;  Surgeon: Jinny Carmine, MD;  Location: [facility] SURGERY CNTR;  Service: Endoscopy;  Laterality: N/A;   CORONARY ARTERY BYPASS GRAFT N/A 11/11/2019    Procedure: CORONARY ARTERY BYPASS GRAFTING (CABG) USING LIMA to Diag1; ENDOSCOPICALLY HARVESTED RIGHT GREATER SAPHENOUS VEIN: SVG to OM1; SVG to OM2; SVG to PDA.;  Surgeon: Fleeta Hanford Coy, MD;  Location: [facility] OR;  Service: Open Heart Surgery;  Laterality: N/A;   CORONARY STENT INTERVENTION N/A 12/27/2022    Procedure: CORONARY STENT INTERVENTION;  Surgeon: Mady Bruckner, MD;  Location: [facility] INVASIVE CV LAB;  Service: Cardiovascular;  Laterality: N/A;   CORONARY STENT PLACEMENT   08/07/2015    MID CIRCUMFLEX   ENDARTERECTOMY FEMORAL Bilateral 09/30/2020    Procedure: ENDARTERECTOMY FEMORAL;  Surgeon: Marea Selinda RAMAN, MD;  Location: [facility] ORS;  Service: Vascular;  Laterality: Bilateral;   ENDARTERECTOMY POPLITEAL Left 01/25/2022    Procedure: ENDARTERECTOMY POPLITEAL;  Surgeon: Sheree Penne Bruckner, MD;  Location: [facility] OR;  Service: Vascular;  Laterality: Left;   ENDOVEIN HARVEST OF GREATER SAPHENOUS VEIN Right 11/11/2019    Procedure: ENDOVEIN HARVEST OF GREATER SAPHENOUS VEIN;  Surgeon: Fleeta Hanford Coy, MD;  Location: [facility] OR;  Service: Open Heart Surgery;  Laterality: Right;   ESOPHAGOGASTRODUODENOSCOPY (EGD) WITH PROPOFOL  N/A 02/11/2019    Procedure: ESOPHAGOGASTRODUODENOSCOPY (EGD) WITH BIOPSY and  Dilation;  Surgeon: Jinny Carmine, MD;  Location: [facility] SURGERY CNTR;  Service: Endoscopy;  Laterality: N/A;   ESOPHAGOGASTRODUODENOSCOPY (EGD) WITH PROPOFOL  N/A 12/23/2021    Procedure: ESOPHAGOGASTRODUODENOSCOPY (EGD) WITH PROPOFOL ;  Surgeon: Jinny Carmine, MD;  Location: [facility] ENDOSCOPY;  Service:  Endoscopy;  Laterality: N/A;   FEMORAL-FEMORAL BYPASS GRAFT Left 01/25/2022    Procedure: left Femoral to below the knee Popliteal bypass.;  Surgeon: Sheree Penne Bruckner, MD;  Location: [facility] OR;  Service: Vascular;  Laterality: Left;   HIP SURGERY Left 10/2016    left hip tendon repair   LEFT HEART CATH AND CORONARY ANGIOGRAPHY N/A 11/27/2017    Procedure: LEFT HEART CATH AND CORONARY ANGIOGRAPHY;  Surgeon: Darron Deatrice LABOR, MD;  Location: [facility] INVASIVE CV LAB;  Service: Cardiovascular;  Laterality: N/A;   LOWER EXTREMITY ANGIOGRAPHY Left 02/12/2018    Procedure: LOWER EXTREMITY ANGIOGRAPHY;  Surgeon: Marea Selinda RAMAN, MD;  Location: [facility] INVASIVE CV LAB;  Service: Cardiovascular;  Laterality: Left;  LOWER EXTREMITY ANGIOGRAPHY Left 03/07/2018    Procedure: LOWER EXTREMITY ANGIOGRAPHY;  Surgeon: Marea Selinda RAMAN, MD;  Location: [facility] INVASIVE CV LAB;  Service: Cardiovascular;  Laterality: Left;   LOWER EXTREMITY ANGIOGRAPHY Left 06/04/2018    Procedure: LOWER EXTREMITY ANGIOGRAPHY;  Surgeon: Marea Selinda RAMAN, MD;  Location: [facility] INVASIVE CV LAB;  Service: Cardiovascular;  Laterality: Left;   LOWER EXTREMITY ANGIOGRAPHY Left 09/17/2020    Procedure: LOWER EXTREMITY ANGIOGRAPHY;  Surgeon: Marea Selinda RAMAN, MD;  Location: [facility] INVASIVE CV LAB;  Service: Cardiovascular;  Laterality: Left;   LOWER EXTREMITY ANGIOGRAPHY Left 09/28/2020    Procedure: LOWER EXTREMITY ANGIOGRAPHY;  Surgeon: Marea Selinda RAMAN, MD;  Location: [facility] INVASIVE CV LAB;  Service: Cardiovascular;  Laterality: Left;   LOWER EXTREMITY ANGIOGRAPHY N/A 02/15/2021    Procedure: LOWER EXTREMITY ANGIOGRAPHY;  Surgeon: Marea Selinda RAMAN, MD;  Location: [facility] INVASIVE CV LAB;  Service: Cardiovascular;  Laterality: N/A;   LOWER EXTREMITY ANGIOGRAPHY Left 12/27/2021    Procedure: Lower Extremity Angiography;  Surgeon: Marea Selinda RAMAN, MD;  Location: [facility] INVASIVE CV LAB;  Service: Cardiovascular;  Laterality: Left;   LOWER EXTREMITY ANGIOGRAPHY Left 12/28/2021     Procedure: Lower Extremity Angiography;  Surgeon: Marea Selinda RAMAN, MD;  Location: [facility] INVASIVE CV LAB;  Service: Cardiovascular;  Laterality: Left;   PLACEMENT OF IMPELLA LEFT VENTRICULAR ASSIST DEVICE N/A 11/11/2019    Procedure: PLACEMENT OF IMPELLA LEFT VENTRICULAR ASSIST DEVICE 5.5;  Surgeon: Fleeta Hanford Coy, MD;  Location: [facility] OR;  Service: Open Heart Surgery;  Laterality: N/A;  Midline Sternotomy   POLYPECTOMY N/A 09/13/2018    Procedure: POLYPECTOMY;  Surgeon: Jinny Carmine, MD;  Location: [facility] SURGERY CNTR;  Service: Endoscopy;  Laterality: N/A;   POLYPECTOMY N/A 02/11/2019    Procedure: POLYPECTOMY;  Surgeon: Jinny Carmine, MD;  Location: [facility] SURGERY CNTR;  Service: Endoscopy;  Laterality: N/A;   REMOVAL OF IMPELLA LEFT VENTRICULAR ASSIST DEVICE N/A 11/15/2019    Procedure: REMOVAL OF IMPELLA 5.5 LEFT VENTRICULAR ASSIST DEVICE;  Surgeon: Fleeta Hanford Coy, MD;  Location: [facility] OR;  Service: Open Heart Surgery;  Laterality: N/A;   RIGHT/LEFT HEART CATH AND CORONARY ANGIOGRAPHY N/A 11/05/2019    Procedure: RIGHT/LEFT HEART CATH AND CORONARY ANGIOGRAPHY;  Surgeon: Mady Bruckner, MD;  Location: [facility] INVASIVE CV LAB;  Service: Cardiovascular;  Laterality: N/A;   RIGHT/LEFT HEART CATH AND CORONARY/GRAFT ANGIOGRAPHY Bilateral 12/27/2022    Procedure: RIGHT/LEFT HEART CATH AND CORONARY/GRAFT ANGIOGRAPHY;  Surgeon: Mady Bruckner, MD;  Location: [facility] INVASIVE CV LAB;  Service: Cardiovascular;  Laterality: Bilateral;   SPINE SURGERY       TEE WITHOUT CARDIOVERSION N/A 11/11/2019    Procedure: TRANSESOPHAGEAL ECHOCARDIOGRAM (TEE);  Surgeon: Fleeta Hanford, Coy, MD;  Location: [facility] OR;  Service: Open Heart Surgery;  Laterality: N/A;   TEE WITHOUT CARDIOVERSION N/A 11/15/2019    Procedure: TRANSESOPHAGEAL ECHOCARDIOGRAM (TEE);  Surgeon: Fleeta Hanford, Coy, MD;  Location: [facility] OR;  Service: Open Heart Surgery;  Laterality: N/A;   TONSILLECTOMY              Short Social History:  Social History          Tobacco Use   Smoking status: Former      Current packs/day: 0.00      Average packs/day: 1.3 packs/day for 36.3 years (45.4 ttl pk-yrs)      Types: Cigarettes      Start date: 02/28/1970      Quit date: 02/28/2005      Years since quitting: 18.9  Passive exposure: Never   Smokeless tobacco: Current      Types: Snuff   Tobacco comments:      occaisionally  Substance Use Topics   Alcohol use: Yes      Alcohol/week: 10.0 standard drinks of alcohol      Types: 10 Standard drinks or equivalent per week      Comment: wine/liquor weekly      Allergies       Allergies  Allergen Reactions   Brilinta  [Ticagrelor ] Shortness Of Breath   Chlorhexidine  Gluconate Other (See Comments)      Skin burning for hours afterward   Contrast Media [Iodinated Contrast Media] Itching      Face and head flushing, nose itching after contrast administation for angiogram   Statins Other (See Comments)      Failed Crestor  5 mg twice weekly, Crestor  20 mg daily, Pravastatin 40 mg qd, Lipitor, Zocor - muscle aches   Zetia [Ezetimibe] Other (See Comments)      Muscle aches              Current Outpatient Medications  Medication Sig Dispense Refill   aspirin  EC 81 MG tablet Take 1 tablet (81 mg total) by mouth daily. 150 tablet 2   carvedilol  (COREG ) 6.25 MG tablet Take 1 tablet (6.25 mg total) by mouth 2 (two) times daily. 180 tablet 3   clobetasol (TEMOVATE) 0.05 % external solution Apply 1 Application topically daily.       clopidogrel  (PLAVIX ) 75 MG tablet TAKE 1 TABLET BY MOUTH DAILY WITH BREAKFAST. 90 tablet 3   colchicine  0.6 MG tablet Take 1 tablet (0.6 mg total) by mouth 2 (two) times daily as needed (gout attacks). 180 tablet 1   desoximetasone (TOPICORT) 0.25 % cream Apply 1 Application topically 2 (two) times daily.       Evolocumab (REPATHA SURECLICK) 140 MG/ML SOAJ Inject 140 mg into the skin every 14 (fourteen) days. 6 mL 3   hydrOXYzine (ATARAX) 25 MG tablet Take 25 mg by mouth at  bedtime.       levocetirizine (XYZAL) 5 MG tablet Take 5 mg by mouth daily.       methocarbamol (ROBAXIN) 500 MG tablet Take 500 mg by mouth. 1 po q occasionally as needed for pain/spasm. No more than one every few days.       mexiletine (MEXITIL ) 150 MG capsule TAKE 1 CAPSULE BY MOUTH TWICE A DAY 180 capsule 3   nitroGLYCERIN  (NITROSTAT ) 0.4 MG SL tablet Place 1 tablet (0.4 mg total) under the tongue every 5 (five) minutes as needed for chest pain. 25 tablet 2   oxyCODONE -acetaminophen  (PERCOCET) 5-325 MG tablet Take 1 tablet by mouth every 8 (eight) hours as needed. 20 tablet 0   sacubitril -valsartan  (ENTRESTO ) 49-51 MG Take 1 tablet by mouth 2 (two) times daily. 180 tablet 3   spironolactone  (ALDACTONE ) 25 MG tablet Take 0.5 tablets (12.5 mg total) by mouth daily. 45 tablet 3   traMADol  (ULTRAM ) 50 MG tablet Take by mouth.       traZODone  (DESYREL ) 50 MG tablet TAKE 1 TABLET (50 MG TOTAL) BY MOUTH AT BEDTIME AS NEEDED. FOR SLEEP 90 tablet 1      No current facility-administered medications for this visit.        Review of Systems  Constitutional:  Constitutional negative. HENT: HENT negative.  Eyes: Eyes negative.  Cardiovascular: Cardiovascular negative.  GI: Gastrointestinal negative.  Musculoskeletal: Positive for joint pain.  Neurological: Neurological negative. Hematologic: Hematologic/lymphatic  negative.  Psychiatric: Psychiatric negative.          Objective:    Vitals:   03/11/24 0917  BP: (!) (P) 150/89  Pulse: 74  Temp: (P) 98.3 F (36.8 C)  SpO2: 96%      Physical Exam HENT:     Nose: Nose normal.  Eyes:     Pupils: Pupils are equal, round, and reactive to light.  Cardiovascular:     Pulses:          Popliteal pulses are 0 on the right side and 2+ on the left side.       Dorsalis pedis pulses are 0 on the right side and 0 on the left side.       Posterior tibial pulses are 0 on the right side and 0 on the left side.  Pulmonary:     Effort: Pulmonary  effort is normal.  Abdominal:     General: Abdomen is flat.     Tenderness: There is no abdominal tenderness.  Musculoskeletal:     Right lower leg: No edema.     Left lower leg: No edema.  Skin:    General: Skin is warm.     Capillary Refill: Capillary refill takes 2 to 3 seconds.  Neurological:     General: No focal deficit present.     Mental Status: He is alert.  Psychiatric:        Mood and Affect: Mood normal.       Data: ABI Findings:  +---------+------------------+-----+----------+--------+  Right   Rt Pressure (mmHg)IndexWaveform  Comment   +---------+------------------+-----+----------+--------+  Brachial 129                                        +---------+------------------+-----+----------+--------+  ATA     93                0.72 monophasic          +---------+------------------+-----+----------+--------+  PTA     93                0.72 monophasic          +---------+------------------+-----+----------+--------+  Great Toe50                0.38                     +---------+------------------+-----+----------+--------+   +---------+------------------+-----+----------+-------+  Left    Lt Pressure (mmHg)IndexWaveform  Comment  +---------+------------------+-----+----------+-------+  Brachial 130                                       +---------+------------------+-----+----------+-------+  ATA     121               0.93 monophasic         +---------+------------------+-----+----------+-------+  PTA     147               1.13 monophasic         +---------+------------------+-----+----------+-------+  Great Toe0                 0.00                    +---------+------------------+-----+----------+-------+   +-------+-----------+-----------+------------+------------+  ABI/TBIToday's ABIToday's TBIPrevious ABIPrevious TBI  +-------+-----------+-----------+------------+------------+  Right  0.72       0.38       1.02        0.58          +-------+-----------+-----------+------------+------------+  Left  1.13       0          1.1         0.28          +-------+-----------+-----------+------------+------------+         Arterial wall calcification precludes accurate ankle pressures and ABIs.  Previous ABI on 11/30/22.    Summary:  Right: Resting right ankle-brachial index indicates moderate right lower  extremity arterial disease. The right toe-brachial index is abnormal.    Left: Resting left ankle-brachial index is within normal range. The left  toe-brachial index is abnormal.     LEFT       PSV cm/sRatioStenosisWaveform  Comments  +-----------+--------+-----+--------+----------+--------+  SFA Prox   31                                       +-----------+--------+-----+--------+----------+--------+  SFA Mid    50                                       +-----------+--------+-----+--------+----------+--------+  ATA Distal 40                   monophasic          +-----------+--------+-----+--------+----------+--------+  PTA Distal 41                   monophasic          +-----------+--------+-----+--------+----------+--------+  PERO Distal72                   monophasic          +-----------+--------+-----+--------+----------+--------+     Left Graft #1: mid SFA to BK pop  +--------------------+--------+--------+----------+--------+                     PSV cm/sStenosisWaveform  Comments  +--------------------+--------+--------+----------+--------+  Inflow             64              biphasic            +--------------------+--------+--------+----------+--------+  Proximal Anastomosis54              monophasic          +--------------------+--------+--------+----------+--------+  Proximal Graft      53              biphasic             +--------------------+--------+--------+----------+--------+  Mid Graft           44              triphasic           +--------------------+--------+--------+----------+--------+  Distal Graft        42              triphasic           +--------------------+--------+--------+----------+--------+  Distal Anastomosis  42              monophasic          +--------------------+--------+--------+----------+--------+  Outflow  42              monophasic          +--------------------+--------+--------+----------+--------+     Summary:  Left: Patent graft without stenosis. Velocities at 40 cm/s.          Assessment/Plan:    74 year old male with history of left lower extremity bypass for rest pain that now appears patent with stable velocities over the past 18 months.  They are somewhat decreased and monophasic distally and I am unsure how to reconcile this as he does not have palpable pulses and his toe pressure is 0 but he remains asymptomatic.  Ultimately he has elected for angiography from a right common femoral approach but the bypass but does appear patent is possible an issue with tibial runoff.      Linder Prajapati C. Sheree, MD [facility] Office: [phone] Pager: [phone]    "

## 2024-03-11 NOTE — Discharge Instructions (Signed)
 Femoral Site Care This sheet gives you information about how to care for yourself after your procedure. Your health care provider may also give you more specific instructions. If you have problems or questions, contact your health care provider. What can I expect after the procedure?  After the procedure, it is common to have: Bruising that usually fades within 1-2 weeks. Tenderness at the site. Follow these instructions at home: Wound care Follow instructions from your health care provider about how to take care of your insertion site. Make sure you: Wash your hands with soap and water before you change your bandage (dressing). If soap and water are not available, use hand sanitizer. Remove your dressing as told by your health care provider. In 24 hours Do not take baths, swim, or use a hot tub until your health care provider approves. You may shower 24-48 hours after the procedure or as told by your health care provider. Gently wash the site with plain soap and water. Pat the area dry with a clean towel. Do not rub the site. This may cause bleeding. Do not apply powder or lotion to the site. Keep the site clean and dry. Check your femoral site every day for signs of infection. Check for: Redness, swelling, or pain. Fluid or blood. Warmth. Pus or a bad smell. Activity For the first 2-3 days after your procedure, or as long as directed: Avoid climbing stairs as much as possible. Do not squat. Do not lift anything that is heavier than 10 lb (4.5 kg), or the limit that you are told, until your health care provider says that it is safe. For 5 days Rest as directed. Avoid sitting for a long time without moving. Get up to take short walks every 1-2 hours. Do not drive for 24 hours if you were given a medicine to help you relax (sedative). General instructions Take over-the-counter and prescription medicines only as told by your health care provider. Keep all follow-up visits as told by  your health care provider. This is important. Contact a health care provider if you have: A fever or chills. You have redness, swelling, or pain around your insertion site. Get help right away if: The catheter insertion area swells very fast. You pass out. You suddenly start to sweat or your skin gets clammy. The catheter insertion area is bleeding, and the bleeding does not stop when you hold steady pressure on the area. The area near or just beyond the catheter insertion site becomes pale, cool, tingly, or numb. These symptoms may represent a serious problem that is an emergency. Do not wait to see if the symptoms will go away. Get medical help right away. Call your local emergency services (911 in the [place] Do not drive yourself to the hospital. Summary After the procedure, it is common to have bruising that usually fades within 1-2 weeks. Check your femoral site every day for signs of infection. Do not lift anything that is heavier than 10 lb (4.5 kg), or the limit that you are told, until your health care provider says that it is safe. This information is not intended to replace advice given to you by your health care provider. Make sure you discuss any questions you have with your health care provider. Document Revised: 02/27/2017 Document Reviewed: 02/27/2017 Elsevier Patient Education  2020 [employer].

## 2024-03-11 NOTE — Op Note (Signed)
 "   Patient name: [name] MRN: [id number] DOB: 11-01-1950 Sex: male  03/11/2024 Pre-operative Diagnosis: Threatened left lower extremity bypass graft Post-operative diagnosis:  Same Surgeon:  Penne C. Sheree, MD Procedure Performed: 1.  Percutaneous ultrasound-guided cannulation right common femoral artery 2.  Catheter selection of aorta and aortogram with bilateral lower extremity angiography 3.  Catheter selection left common femoral artery and left lower extremity bypass graft 4.  Moderate sedation and fentanyl  and Versed  for 34 minutes  Indications: 74 year old male with history of multiple previous vascular procedures to bilateral lower extremities now with symptoms mostly in the right lower extremity with claudication and numbness which we have discussed are likely multifactorial.  He has a toe pressure of 0 to the left lower extremity with sluggish flow throughout the bypass graft we have discussed proceeding with angiography with possible intervention.  Findings: The aorta and the bilateral renal arteries are patent.  Bilateral common iliac arteries are patent the right hypogastric is occluded and there is a stent in the right external iliac artery.  The left external iliac artery is patent.  On the right side there is a patulous common femoral artery which is patent into an SFA which has approximately 95% stenosis distally and the proximal above-knee popliteal artery also has an area of 80% stenosis.  He has three-vessel runoff to the ankle the dominant runoff in the right is the anterior tibial artery.  On the left side the common femoral artery again is patulous there is severe disease in the profunda femoris artery which is longstanding.  The SFA has approximate 30% stenosis proximally there is a bypass of the mid SFA with sluggish flow throughout but no evidence of stenosis.  This bypasses to the TP trunk with 3 vessels all patent to the ankle anterior tibial artery is the dominant  runoff and the posterior tibial artery is occluded at the ankle but reconstitutes plantar vessels.  Patient has sluggish flow throughout both lower extremities.  He could be considered for right SFA and popliteal intervention in the future, the bypass graft is patent and does not require intervention.   Procedure:  The patient was identified in the holding area and taken to room 8.  The patient was then placed supine on the table and prepped and draped in the usual sterile fashion.  A time out was called.  Ultrasound was used to evaluate the right common femoral artery where there was dense scar tissue.  Skin was anesthetized 1% lidocaine  down to the level the artery and the right common femoral artery was cannulated with a micropuncture needle followed by wire and sheath.  Concomitantly we administered fentanyl  and Versed  as moderate sedation as vital signs were monitored throughout the case.  We placed the Bentson wire then dilated the wire tract and placed a 5 French sheath.  An Omni catheter was placed to the level of L1 and aortogram was performed.  We then crossed the bifurcation using Glidewire advantage and Omni catheter and perform left lower extremity angiography.  We did select the bypass graft using a navi cross catheter as there was sluggish flow throughout the bypass.  The catheter was then removed over a wire and right lower extremity angiography was performed via retrograde right common femoral sheath just.  Given the dense scar tissue the sheath will need to be pulled in postoperative holding.  He otherwise tolerated the procedure without Ameeth complication.   Contrast: 75cc  Octa Uplinger C. Sheree, MD [facility] Office: [phone] Pager: [phone]   "

## 2024-03-11 NOTE — Progress Notes (Signed)
 Site: right femoral / 23fr / arterial Condition prior to removal:  Level 0 Type of pressure held: manual Time pressure held: 25 minutes Status of patient during pull:  stable Condition of site post pull:  Level 0 Type of dressing applied: gauze / cdi Patient advised of bedrest and educated on groin management.

## 2024-03-11 NOTE — Progress Notes (Signed)
 Patient walked to the bathroom without difficulties. Right groin level 0, clean, dry, and intact.

## 2024-03-11 NOTE — Progress Notes (Signed)
 Bedrest begins at 1205 and will be complete at 1605

## 2024-03-12 ENCOUNTER — Encounter (HOSPITAL_COMMUNITY): Payer: Self-pay | Admitting: Vascular Surgery

## 2024-03-13 NOTE — Progress Notes (Signed)
 "     Electrophysiology Clinic Note    Date:  03/14/2024  Patient ID:  [name], [name] [birth year], MRN [id number] PCP:  Justus Leita DEL, MD  Cardiologist:  Lonni Hanson, MD  Electrophysiologist:  Fonda Kitty, MD  Electrophysiology APP:  Roby Spalla, NP     Discussed the use of AI scribe software for clinical note transcription with the patient, who gave verbal consent to proceed.   Patient Profile    Chief Complaint: device follow-up  History of Present Illness: [name] is a 74 y.o. male with PMH notable for HFrEF, mixed NICM and ICM, HFrEF, s/p CRT-D, CAD s/p CABG then PCI, post-operative AFib, PVCs, PAD ; seen today for Fonda Kitty, MD (Previously Dr. Cindie) for routine electrophysiology followup.   He has history of phrenic stim requiring multiple device adjustments throughout the spring 2025. I last saw him 06/2023 at which time he had good exercise tolerance.  EF had improved slightly to 40-45% so mexiletine was continued.  V pacing 90%.   On follow-up today, he is overall feeling well without acute cardiac complaints. He denies palpitations, chest pain, chest pressure. No longer having phrenic stim. He continues to take mexiletine BID without missing doses.  He was recently eval'd by vasc surgery for R calf/foot numbness, has PAD but no procedure/intervention recommended. He plans to follow-up with back/hip provider to see if that is the cause of his symptoms.   Continues to play golf as often as he can.       Arrhythmia/Device History St[employer] Jude CRT-D, imp 2022; dx ICM     AAD History: Amiodarone  - 07/2021 - dc'd sometime prior to 11/2021. Pt felt poorly Mexiletine - started 11/2021     ROS:  Please see the history of present illness. All other systems are reviewed and otherwise negative.    Physical Exam    VS:  BP 122/70 (BP Location: Left Arm, Patient Position: Sitting, Cuff Size: Normal)   Pulse 74   Wt 189 lb 12.8 oz (86.1 kg)   SpO2  95%   BMI 30.63 kg/m  BMI: Body mass index is 30.63 kg/m.           Wt Readings from Last 3 Encounters:  03/14/24 189 lb 12.8 oz (86.1 kg)  03/11/24 179 lb 14.3 oz (81.6 kg)  01/31/24 182 lb (82.6 kg)      GEN- The patient is well appearing, alert and oriented x 3 today.   Lungs- Clear to ausculation bilaterally, normal work of breathing.  Heart- Regular rate with rare ectopy , no murmurs, rubs or gallops Extremities- No peripheral edema, warm, dry Skin-  device pocket well-healed, no tethering   Device interrogation done today and reviewed by myself:  Battery 1.9 years Lead thresholds, impedence, sensing stable  VP 94% CorVue a little dry No episodes No changes made today   Studies Reviewed   Previous EP, cardiology notes.    EKG is ordered. Personal review of EKG from today shows:    EKG Interpretation Date/Time:  Thursday March 14 2024 10:36:10 EST Ventricular Rate:  78 PR Interval:    QRS Duration:  154 QT Interval:  430 QTC Calculation: 490 R Axis:   181  Text Interpretation: Ventricular-paced rhythm with occasional supraventricular complexes Biventricular pacemaker detected Confirmed by Varonica Siharath [phone]) on 03/14/2024 10:41:16 AM    TTE, 06/27/2023  1. Left ventricular ejection fraction, by estimation, is 40 to 45%. Left ventricular ejection fraction by PLAX is 46 %.  The left ventricle has mildly decreased function. The left ventricle demonstrates regional wall motion abnormalities (inferior wall hypokinesis). Left ventricular diastolic parameters are consistent with Grade I diastolic dysfunction (impaired relaxation). The average left ventricular global longitudinal strain is -15.2 %. The global longitudinal strain is abnormal.   2. Right ventricular systolic function is normal. The right ventricular size is normal. Tricuspid regurgitation signal is inadequate for assessing PA pressure.   3. The mitral valve is normal in structure. Mild mitral valve  regurgitation. No evidence of mitral stenosis.   4. The aortic valve is normal in structure. Aortic valve regurgitation is mild. Aortic valve sclerosis is present, with no evidence of aortic valve stenosis.   5. The inferior vena cava is normal in size with greater than 50% respiratory variability, suggesting right atrial pressure of 3 mmHg.    Limited TTE, 03/20/2023  1. Left ventricular ejection fraction, by estimation, is 35 to 40%. The left ventricle has moderately decreased function. The left ventricle demonstrates global hypokinesis. Left ventricular diastolic parameters are consistent with Grade I diastolic dysfunction (impaired relaxation).   2. Right ventricular systolic function is normal.   3. The mitral valve is normal in structure. Mild mitral valve regurgitation.   4. The aortic valve is normal in structure. Aortic valve regurgitation is not visualized.    Coronary stent intervention, 12/27/2022 Severe native coronary artery disease, including 80% distal LMCA stenosis and chronic total occlusions of D2 and OM1.  Culprit disease for the patient's recurrent atypical angina and worsening LVEF is likely sequential 50% and 90% stenoses in the proximal/mid RCA. Widely patent LIMA-D2 and SVG Y-graft to OM1 and OM4. Chronically occluded SVG-RPDA. Upper normal to mildly elevated left heart and pulmonary artery pressures. Mildly elevated right heart filling pressure. Normal Fick cardiac output/index. Successful PCI to proximal/mid RCA using Onyx Frontier 3.0 x 30 mm drug-eluting stent (postdilated to 3.6 mm) with 0% residual stenosis and TIMI-3 flow.   TTE, 12/13/2022  1. Left ventricular ejection fraction, by estimation, is 40 to 45%. Left ventricular ejection fraction by 3D volume is 43 %. The left ventricle has mildly decreased function. The left ventricle demonstrates mild global hypokinesis with moderate inferior  wall hypokinesis. The left ventricular internal cavity size was mildly  dilated. Left ventricular diastolic parameters are consistent with Grade I diastolic dysfunction (impaired relaxation). The average left ventricular global longitudinal strain is -9.5 %.   2. Right ventricular systolic function is normal. The right ventricular size is normal.   3. Left atrial size was mildly dilated.   4. The mitral valve is normal in structure. Mild mitral valve regurgitation. No evidence of mitral stenosis.   5. The aortic valve is tricuspid. Aortic valve regurgitation is mild. No aortic stenosis is present.   6. There is mild dilatation of the ascending aorta, measuring 42 mm.   7. The inferior vena cava is normal in size with greater than 50% respiratory variability, suggesting right atrial pressure of 3 mmHg.    TTE, 08/10/2020  1. Left ventricular ejection fraction, by estimation, is 50 to 55%. The left ventricle has low normal function. The left ventricle has no regional wall motion abnormalities. The left ventricular internal cavity size was mildly dilated. Left ventricular diastolic parameters are indeterminate. The average left ventricular global longitudinal strain is -14.0 %. The global longitudinal strain is abnormal.   2. Right ventricular systolic function is normal. The right ventricular size is normal. Tricuspid regurgitation signal is inadequate for assessing PA pressure.  3. Left atrial size was mildly dilated.   4. The mitral valve is normal in structure. Mild mitral valve regurgitation. No evidence of mitral stenosis.   5. The aortic valve is normal in structure. Aortic valve regurgitation is trivial. Mild aortic valve sclerosis is present, with no evidence of aortic valve stenosis.   Assessment and Plan     #) ICM s/p CRT-D #) PVC Device functioning well, see paceart for details Battery good - we briefly discussed gen change procedure Stable lead measurements VP at 94%, somewhat improved from prior Continue mexiletine 150mg  BID Continue coreg  12.5mg   BID  #) HFmrEF Good exercise tolerance, limited by L calf numbness No SOB, chest pain, chest pressure with activity CorVue a little high but appears to be moving toward baseline Continue coreg  as above, entresto  49-51, 12.5mg  spiro daily He is due for gen cards follow-up in the next few months, will facilitate appt      Current medicines are reviewed at length with the patient today.   The patient does not have concerns regarding his medicines.  The following changes were made today:  none  Labs/ tests ordered today include:  Orders Placed This Encounter  Procedures   EKG 12-Lead     Disposition: Follow up with Dr. Kennyth or EP APP in 12 months, continue remote monitoring  Follow-up with Dr. Mady or PA Dunn in ~2 months   Signed, Chantal Needle, NP  03/14/24  12:25 PM  Electrophysiology [facility] "

## 2024-03-14 ENCOUNTER — Encounter: Payer: Self-pay | Admitting: Cardiology

## 2024-03-14 ENCOUNTER — Ambulatory Visit: Attending: Cardiology | Admitting: Cardiology

## 2024-03-14 VITALS — BP 122/70 | HR 74 | Wt 189.8 lb

## 2024-03-14 DIAGNOSIS — Z95810 Presence of automatic (implantable) cardiac defibrillator: Secondary | ICD-10-CM

## 2024-03-14 DIAGNOSIS — I255 Ischemic cardiomyopathy: Secondary | ICD-10-CM | POA: Diagnosis not present

## 2024-03-14 DIAGNOSIS — I5022 Chronic systolic (congestive) heart failure: Secondary | ICD-10-CM

## 2024-03-14 DIAGNOSIS — I493 Ventricular premature depolarization: Secondary | ICD-10-CM | POA: Diagnosis not present

## 2024-03-14 LAB — CUP PACEART INCLINIC DEVICE CHECK
Battery Remaining Longevity: 22 mo
Brady Statistic RA Percent Paced: 87 %
Brady Statistic RV Percent Paced: 94 %
Date Time Interrogation Session: 20260115125139
HighPow Impedance: 63 Ohm
Implantable Lead Connection Status: 753985
Implantable Lead Connection Status: 753985
Implantable Lead Connection Status: 753985
Implantable Lead Implant Date: 20220124
Implantable Lead Implant Date: 20220124
Implantable Lead Implant Date: 20220124
Implantable Lead Location: 753858
Implantable Lead Location: 753859
Implantable Lead Location: 753860
Implantable Pulse Generator Implant Date: 20220124
Lead Channel Impedance Value: 475 Ohm
Lead Channel Impedance Value: 475 Ohm
Lead Channel Impedance Value: 912.5 Ohm
Lead Channel Pacing Threshold Amplitude: 0.5 V
Lead Channel Pacing Threshold Amplitude: 0.5 V
Lead Channel Pacing Threshold Amplitude: 0.75 V
Lead Channel Pacing Threshold Amplitude: 0.75 V
Lead Channel Pacing Threshold Amplitude: 2 V
Lead Channel Pacing Threshold Amplitude: 2 V
Lead Channel Pacing Threshold Amplitude: 2.625 V
Lead Channel Pacing Threshold Pulse Width: 0.5 ms
Lead Channel Pacing Threshold Pulse Width: 0.5 ms
Lead Channel Pacing Threshold Pulse Width: 0.5 ms
Lead Channel Pacing Threshold Pulse Width: 0.5 ms
Lead Channel Pacing Threshold Pulse Width: 0.5 ms
Lead Channel Pacing Threshold Pulse Width: 0.5 ms
Lead Channel Pacing Threshold Pulse Width: 0.5 ms
Lead Channel Sensing Intrinsic Amplitude: 12 mV
Lead Channel Sensing Intrinsic Amplitude: 2.3 mV
Lead Channel Setting Pacing Amplitude: 1.875
Lead Channel Setting Pacing Amplitude: 2 V
Lead Channel Setting Pacing Amplitude: 2.5 V
Lead Channel Setting Pacing Pulse Width: 0.5 ms
Lead Channel Setting Pacing Pulse Width: 0.5 ms
Lead Channel Setting Sensing Sensitivity: 0.5 mV
Pulse Gen Serial Number: 810017138
Zone Setting Status: 755011

## 2024-03-14 NOTE — Patient Instructions (Addendum)
 Medication Instructions: Your physician recommends that you continue on your current medications as directed. Please refer to the Current Medication list given to you today.   *If you need a refill on your cardiac medications before your next appointment, please call your pharmacy*  Lab Work: No labs ordered today   If you have labs (blood work) drawn today and your tests are completely normal, you will receive your results only by: MyChart Message (if you have MyChart) OR A paper copy in the mail If you have any lab test that is abnormal or we need to change your treatment, we will call you to review the results.  Testing/Procedures: No test ordered today    Follow-Up: At [facility], you and your health needs are our priority.  As part of our continuing mission to provide you with exceptional heart care, our providers are all part of one team.  This team includes your primary Cardiologist (physician) and Advanced Practice Providers or APPs (Physician Assistants and Nurse Practitioners) who all work together to provide you with the care you need, when you need it.  Your next appointment:   2 month(s)  Provider:   Lonni Hanson, MD   1 year with Dr. Kennyth or Chantal Needle, NP  We recommend signing up for the patient portal called MyChart.  Sign up information is provided on this After Visit Summary.  MyChart is used to connect with patients for Virtual Visits (Telemedicine).  Patients are able to view lab/test results, encounter notes, upcoming appointments, etc.  Non-urgent messages can be sent to your provider as well.   To learn more about what you can do with MyChart, go to [email or web address].

## 2024-03-17 ENCOUNTER — Ambulatory Visit: Payer: Self-pay | Admitting: Cardiology

## 2024-03-19 ENCOUNTER — Ambulatory Visit

## 2024-03-19 DIAGNOSIS — I447 Left bundle-branch block, unspecified: Secondary | ICD-10-CM

## 2024-03-21 LAB — CUP PACEART REMOTE DEVICE CHECK
Battery Remaining Longevity: 23 mo
Battery Remaining Percentage: 33 %
Battery Voltage: 2.92 V
Brady Statistic AP VP Percent: 90 %
Brady Statistic AP VS Percent: 4.7 %
Brady Statistic AS VP Percent: 1.6 %
Brady Statistic AS VS Percent: 1 %
Brady Statistic RA Percent Paced: 90 %
Date Time Interrogation Session: 20260120045224
HighPow Impedance: 64 Ohm
Implantable Lead Connection Status: 753985
Implantable Lead Connection Status: 753985
Implantable Lead Connection Status: 753985
Implantable Lead Implant Date: 20220124
Implantable Lead Implant Date: 20220124
Implantable Lead Implant Date: 20220124
Implantable Lead Location: 753858
Implantable Lead Location: 753859
Implantable Lead Location: 753860
Implantable Pulse Generator Implant Date: 20220124
Lead Channel Impedance Value: 450 Ohm
Lead Channel Impedance Value: 460 Ohm
Lead Channel Impedance Value: 880 Ohm
Lead Channel Pacing Threshold Amplitude: 0.5 V
Lead Channel Pacing Threshold Amplitude: 0.75 V
Lead Channel Pacing Threshold Amplitude: 2.375 V
Lead Channel Pacing Threshold Pulse Width: 0.5 ms
Lead Channel Pacing Threshold Pulse Width: 0.5 ms
Lead Channel Pacing Threshold Pulse Width: 0.5 ms
Lead Channel Sensing Intrinsic Amplitude: 12 mV
Lead Channel Sensing Intrinsic Amplitude: 2.4 mV
Lead Channel Setting Pacing Amplitude: 1.75 V
Lead Channel Setting Pacing Amplitude: 2 V
Lead Channel Setting Pacing Amplitude: 2.875
Lead Channel Setting Pacing Pulse Width: 0.5 ms
Lead Channel Setting Pacing Pulse Width: 0.5 ms
Lead Channel Setting Sensing Sensitivity: 0.5 mV
Pulse Gen Serial Number: 810017138
Zone Setting Status: 755011

## 2024-03-22 NOTE — Progress Notes (Signed)
 Remote ICD Transmission

## 2024-03-25 ENCOUNTER — Ambulatory Visit: Payer: Self-pay | Admitting: Cardiology

## 2024-04-08 ENCOUNTER — Encounter: Payer: PPO | Admitting: Physician Assistant

## 2024-05-14 ENCOUNTER — Ambulatory Visit: Admitting: Internal Medicine

## 2024-06-18 ENCOUNTER — Ambulatory Visit

## 2024-07-31 ENCOUNTER — Ambulatory Visit (HOSPITAL_COMMUNITY)

## 2024-07-31 ENCOUNTER — Ambulatory Visit: Admitting: Vascular Surgery

## 2024-09-17 ENCOUNTER — Ambulatory Visit

## 2024-12-17 ENCOUNTER — Ambulatory Visit
